# Patient Record
Sex: Male | Born: 1950 | Race: Black or African American | Hispanic: No | Marital: Single | Smoking: Former smoker
Health system: Southern US, Community
[De-identification: ages and names within clinical notes are randomized; demographics above are authoritative.]

## PROBLEM LIST (undated history)

## (undated) DIAGNOSIS — N183 Chronic kidney disease, stage 3 (moderate): Secondary | ICD-10-CM

## (undated) DIAGNOSIS — E119 Type 2 diabetes mellitus without complications: Secondary | ICD-10-CM

## (undated) DIAGNOSIS — J449 Chronic obstructive pulmonary disease, unspecified: Secondary | ICD-10-CM

## (undated) DIAGNOSIS — I25110 Atherosclerotic heart disease of native coronary artery with unstable angina pectoris: Secondary | ICD-10-CM

## (undated) DIAGNOSIS — J439 Emphysema, unspecified: Secondary | ICD-10-CM

## (undated) DIAGNOSIS — I2510 Atherosclerotic heart disease of native coronary artery without angina pectoris: Secondary | ICD-10-CM

## (undated) DIAGNOSIS — I10 Essential (primary) hypertension: Secondary | ICD-10-CM

## (undated) DIAGNOSIS — E785 Hyperlipidemia, unspecified: Secondary | ICD-10-CM

## (undated) DIAGNOSIS — R42 Dizziness and giddiness: Secondary | ICD-10-CM

## (undated) DIAGNOSIS — N186 End stage renal disease: Secondary | ICD-10-CM

## (undated) DIAGNOSIS — R0902 Hypoxemia: Secondary | ICD-10-CM

## (undated) HISTORY — DX: Atherosclerotic heart disease of native coronary artery with unstable angina pectoris: I25.110

## (undated) HISTORY — DX: Hypoxemia: R09.02

## (undated) HISTORY — DX: Chronic obstructive pulmonary disease, unspecified: J44.9

## (undated) HISTORY — DX: Atherosclerotic heart disease of native coronary artery without angina pectoris: I25.10

## (undated) HISTORY — DX: Hyperlipidemia, unspecified: E78.5

## (undated) HISTORY — DX: Dizziness and giddiness: R42

---

## 1998-11-21 ENCOUNTER — Encounter: Payer: Self-pay | Admitting: *Deleted

## 1998-11-21 ENCOUNTER — Emergency Department (HOSPITAL_COMMUNITY): Admission: EM | Admit: 1998-11-21 | Discharge: 1998-11-21 | Payer: Self-pay | Admitting: *Deleted

## 1999-06-15 ENCOUNTER — Encounter: Admission: RE | Admit: 1999-06-15 | Discharge: 1999-07-19 | Payer: Self-pay | Admitting: *Deleted

## 1999-08-05 ENCOUNTER — Ambulatory Visit (HOSPITAL_COMMUNITY): Admission: RE | Admit: 1999-08-05 | Discharge: 1999-08-05 | Payer: Self-pay | Admitting: *Deleted

## 2000-12-28 ENCOUNTER — Ambulatory Visit (HOSPITAL_COMMUNITY): Admission: RE | Admit: 2000-12-28 | Discharge: 2000-12-28 | Payer: Self-pay | Admitting: Family Medicine

## 2000-12-28 ENCOUNTER — Encounter: Payer: Self-pay | Admitting: Family Medicine

## 2001-08-13 ENCOUNTER — Ambulatory Visit (HOSPITAL_COMMUNITY): Admission: RE | Admit: 2001-08-13 | Discharge: 2001-08-13 | Payer: Self-pay | Admitting: Family Medicine

## 2001-08-13 ENCOUNTER — Encounter: Payer: Self-pay | Admitting: Family Medicine

## 2001-09-03 ENCOUNTER — Encounter: Payer: Self-pay | Admitting: Family Medicine

## 2001-09-03 ENCOUNTER — Ambulatory Visit (HOSPITAL_COMMUNITY): Admission: RE | Admit: 2001-09-03 | Discharge: 2001-09-03 | Payer: Self-pay | Admitting: Family Medicine

## 2002-02-06 ENCOUNTER — Ambulatory Visit (HOSPITAL_COMMUNITY): Admission: RE | Admit: 2002-02-06 | Discharge: 2002-02-06 | Payer: Self-pay | Admitting: Gastroenterology

## 2004-02-03 ENCOUNTER — Ambulatory Visit: Payer: Self-pay | Admitting: *Deleted

## 2004-03-15 ENCOUNTER — Ambulatory Visit: Payer: Self-pay | Admitting: Nurse Practitioner

## 2004-08-13 ENCOUNTER — Ambulatory Visit: Payer: Self-pay | Admitting: Nurse Practitioner

## 2004-08-19 ENCOUNTER — Ambulatory Visit: Payer: Self-pay | Admitting: Nurse Practitioner

## 2005-04-15 ENCOUNTER — Ambulatory Visit: Payer: Self-pay | Admitting: Nurse Practitioner

## 2005-04-28 ENCOUNTER — Ambulatory Visit: Payer: Self-pay | Admitting: Nurse Practitioner

## 2005-08-02 ENCOUNTER — Ambulatory Visit: Payer: Self-pay | Admitting: Nurse Practitioner

## 2006-02-27 ENCOUNTER — Ambulatory Visit: Payer: Self-pay | Admitting: Nurse Practitioner

## 2006-09-20 ENCOUNTER — Ambulatory Visit: Payer: Self-pay | Admitting: Nurse Practitioner

## 2006-10-06 ENCOUNTER — Ambulatory Visit: Payer: Self-pay | Admitting: Nurse Practitioner

## 2006-10-20 ENCOUNTER — Ambulatory Visit: Payer: Self-pay | Admitting: Nurse Practitioner

## 2006-11-28 ENCOUNTER — Ambulatory Visit: Payer: Self-pay | Admitting: Internal Medicine

## 2007-02-14 ENCOUNTER — Encounter (INDEPENDENT_AMBULATORY_CARE_PROVIDER_SITE_OTHER): Payer: Self-pay | Admitting: *Deleted

## 2008-01-22 ENCOUNTER — Encounter (INDEPENDENT_AMBULATORY_CARE_PROVIDER_SITE_OTHER): Payer: Self-pay | Admitting: Family Medicine

## 2008-01-22 ENCOUNTER — Ambulatory Visit: Payer: Self-pay | Admitting: Internal Medicine

## 2008-01-22 LAB — CONVERTED CEMR LAB: Microalb, Ur: 13.5 mg/dL — ABNORMAL HIGH (ref 0.00–1.89)

## 2008-03-13 ENCOUNTER — Ambulatory Visit: Payer: Self-pay | Admitting: Internal Medicine

## 2010-06-27 ENCOUNTER — Inpatient Hospital Stay (HOSPITAL_COMMUNITY)
Admission: EM | Admit: 2010-06-27 | Discharge: 2010-07-01 | DRG: 384 | Disposition: A | Discharge status: Home / Self Care | Payer: Medicare Other | Attending: Gastroenterology | Admitting: Gastroenterology

## 2010-06-27 DIAGNOSIS — I10 Essential (primary) hypertension: Secondary | ICD-10-CM | POA: Diagnosis present

## 2010-06-27 DIAGNOSIS — Z87891 Personal history of nicotine dependence: Secondary | ICD-10-CM

## 2010-06-27 DIAGNOSIS — K311 Adult hypertrophic pyloric stenosis: Secondary | ICD-10-CM | POA: Diagnosis present

## 2010-06-27 DIAGNOSIS — J3489 Other specified disorders of nose and nasal sinuses: Secondary | ICD-10-CM | POA: Diagnosis present

## 2010-06-27 DIAGNOSIS — J4489 Other specified chronic obstructive pulmonary disease: Secondary | ICD-10-CM | POA: Diagnosis present

## 2010-06-27 DIAGNOSIS — E119 Type 2 diabetes mellitus without complications: Secondary | ICD-10-CM | POA: Diagnosis present

## 2010-06-27 DIAGNOSIS — D649 Anemia, unspecified: Secondary | ICD-10-CM | POA: Diagnosis present

## 2010-06-27 DIAGNOSIS — K259 Gastric ulcer, unspecified as acute or chronic, without hemorrhage or perforation: Principal | ICD-10-CM | POA: Diagnosis present

## 2010-06-27 DIAGNOSIS — J449 Chronic obstructive pulmonary disease, unspecified: Secondary | ICD-10-CM | POA: Diagnosis present

## 2010-06-27 LAB — COMPREHENSIVE METABOLIC PANEL
ALT: 16 U/L (ref 0–53)
AST: 22 U/L (ref 0–37)
CO2: 23 mEq/L (ref 19–32)
Chloride: 104 mEq/L (ref 96–112)
Creatinine, Ser: 1.17 mg/dL (ref 0.4–1.5)
GFR calc Af Amer: >60 mL/min (ref >60)
GFR calc non Af Amer: >60 mL/min (ref >60)
Sodium: 138 mEq/L (ref 135–145)
Total Bilirubin: 0.4 mg/dL (ref 0.3–1.2)

## 2010-06-27 LAB — DIFFERENTIAL
Basophils Relative: 1 % (ref 0–1)
Monocytes Relative: 11 % (ref 3–12)
Neutro Abs: 5.4 10*3/uL (ref 1.7–7.7)
Neutrophils Relative %: 74 % (ref 43–77)

## 2010-06-27 LAB — URINALYSIS, ROUTINE W REFLEX MICROSCOPIC
Specific Gravity, Urine: 1.013 (ref 1.005–1.030)
Urine Glucose, Fasting: 250 mg/dL — AB
Urobilinogen, UA: 1 mg/dL (ref 0.0–1.0)
pH: 8 (ref 5.0–8.0)

## 2010-06-27 LAB — POCT I-STAT, CHEM 8
BUN: 22 mg/dL (ref 6–23)
Chloride: 105 mEq/L (ref 96–112)
Potassium: 4.1 mEq/L (ref 3.5–5.1)
Sodium: 139 mEq/L (ref 135–145)
TCO2: 25 mmol/L (ref 0–100)

## 2010-06-27 LAB — CBC
Hemoglobin: 11.6 g/dL — ABNORMAL LOW (ref 13.0–17.0)
MCH: 29.9 pg (ref 26.0–34.0)
RBC: 3.88 MIL/uL — ABNORMAL LOW (ref 4.22–5.81)

## 2010-06-27 LAB — URINE MICROSCOPIC-ADD ON

## 2010-06-28 LAB — CBC
HCT: 32 % — ABNORMAL LOW (ref 39.0–52.0)
Hemoglobin: 11.2 g/dL — ABNORMAL LOW (ref 13.0–17.0)
MCH: 29.8 pg (ref 26.0–34.0)
MCHC: 35 g/dL (ref 30.0–36.0)

## 2010-06-28 LAB — GLUCOSE, CAPILLARY: Glucose-Capillary: 254 mg/dL — ABNORMAL HIGH (ref 70–99)

## 2010-06-28 LAB — BASIC METABOLIC PANEL
CO2: 26 mEq/L (ref 19–32)
Calcium: 8.9 mg/dL (ref 8.4–10.5)
Creatinine, Ser: 1.06 mg/dL (ref 0.4–1.5)
Glucose, Bld: 171 mg/dL — ABNORMAL HIGH (ref 70–99)

## 2010-06-28 LAB — HEMOGLOBIN A1C: Mean Plasma Glucose: 166 mg/dL — ABNORMAL HIGH (ref <117)

## 2010-06-29 ENCOUNTER — Ambulatory Visit (HOSPITAL_COMMUNITY)
Admission: RE | Admit: 2010-06-29 | Discharge: 2010-06-29 | Payer: Self-pay | Source: Home / Self Care | Attending: Gastroenterology | Admitting: Gastroenterology

## 2010-06-29 LAB — GLUCOSE, CAPILLARY
Glucose-Capillary: 166 mg/dL — ABNORMAL HIGH (ref 70–99)
Glucose-Capillary: 149 mg/dL — ABNORMAL HIGH (ref 70–99)

## 2010-06-29 NOTE — H&P (Signed)
Shawn Collins, Shawn Collins               ACCOUNT NO.:  [id number]  MEDICAL RECORD NO.:  [id number]          PATIENT TYPE:  INP  LOCATION:  5024                         FACILITY:  [facility]  PHYSICIAN:  Odis Hollingshead, M.D.DATE OF BIRTH:  December 15, 1950  DATE OF ADMISSION:  06/27/2010 DATE OF DISCHARGE:                             HISTORY & PHYSICAL   REASON FOR ADMISSION:  Epigastric pain, nausea, and vomiting.  HISTORY:  This is a 60 year old male who was in his normal state of health until Thursday when he stated that he started developing epigastric burning with substernal burning consistent with reflux.  It slowly got better on Friday, but on Saturday the pain returned and was mostly in the epigastric region that was associated with significant nausea and vomiting.  He presented earlier to the emergency department here for evaluation.  His laboratory values demonstrated some anemia, but no elevation of his white blood cell count.  He subsequently underwent a CT scan of the abdomen.  This demonstrated some pelvic free fluid.  There is also a 3.4 x 3.1 cm fluid collection that was anterior to the antrum and the stomach with some extrinsic compression, but contrast did go through into the small bowel.  There was a small amount of right perinephric fluid as well.  It was for this reason that I was asked to see him.  He states he does not use aspirin or aspirin-like products.  He has intermittent heartburn and takes Tums for that.  The symptoms seem to be exacerbated when he tries to drink some liquids.  PAST MEDICAL HISTORY: 1. COPD. 2. Diabetes mellitus. 3. Hypertension.  PREVIOUS OPERATIONS:  Appendectomy.  ALLERGIES:  None.  MEDICATIONS:  Glimepiride, Janumet, and lisinopril.  SOCIAL HISTORY:  He is single.  Former smoker.  Denies alcohol use.  FAMILY HISTORY:  No gastric cancer in the family.  REVIEW OF SYSTEMS:  CARDIAC:  He denies any known heart disease or valve disease.   PULMONARY:  He denies asthma or pneumonia.  GI:  He denies any peptic ulcer disease, hepatitis, or diverticulitis.  GU:  He denies any kidney stones.  ENDOCRINE:  He has diabetes.  NEUROLOGIC:  No strokes or seizures.  HEMATOLOGIC:  No bleeding disorders or blood clots.  PHYSICAL EXAMINATION:  GENERAL:  Thin male in no acute distress, pleasant, and cooperative. VITAL SIGNS:  Temperature is 97.4, pulse 82, blood pressure 179/87, respiratory rate 18. EYES:  Extraocular motions intact.  No icterus. NECK:  Supple without mass. RESPIRATORY:  Breath sounds distant but equal and clear. CARDIOVASCULAR:  Regular rate.  Regular rhythm.  No murmurs. ABDOMEN:  Soft, nontender, right lower quadrant scars present.  He has active bowel sounds. MUSCULOSKELETAL:  He has adequate muscle tone.  No edema.  LABORATORY DATA:  Hemoglobin 11.6, white cell count 7300.  Complete metabolic is notable for an elevated glucose of 239 and albumin of 3.3, otherwise is within normal limits.  Lipase is normal.  CT scan was reviewed with the radiologist.  Two-view abdominal x-ray demonstrates no free intraperitoneal air and no bowel dilatation.  Urinalysis negative.  IMPRESSION:  Cystic lesion anterior  to stomach without any inflammatory changes.  Appears to be compressing the stomach somewhat, but contrast does get through.  A part of his symptoms could be related to this or gastroenteritis.  PLAN:  We will admit to the hospital.  Make him n.p.o. and give him IV fluids.  Put on proton pump inhibitor therapy.  We will obtain a GI consult for upper endoscopy.  If this is negative, maybe able to do a radiology-guided aspiration of the cyst fluid.     Odis Hollingshead, M.D.     [name]  D:  06/27/2010  T:  06/28/2010  Job:  [id number]  cc:   Nolene Ebbs, M.D.  Electronically Signed by Jackolyn Confer M.D. on 06/29/2010 09:29:59 AM

## 2010-06-30 LAB — GLUCOSE, CAPILLARY: Glucose-Capillary: 64 mg/dL — ABNORMAL LOW (ref 70–99)

## 2010-07-01 LAB — BASIC METABOLIC PANEL
BUN: 14 mg/dL (ref 6–23)
CO2: 25 mEq/L (ref 19–32)
Chloride: 108 mEq/L (ref 96–112)
Creatinine, Ser: 1.37 mg/dL (ref 0.4–1.5)
Glucose, Bld: 60 mg/dL — ABNORMAL LOW (ref 70–99)
Potassium: 4.4 mEq/L (ref 3.5–5.1)

## 2010-07-01 LAB — CBC
HCT: 30.3 % — ABNORMAL LOW (ref 39.0–52.0)
MCH: 29.5 pg (ref 26.0–34.0)
MCHC: 34.3 g/dL (ref 30.0–36.0)
MCV: 86.1 fL (ref 78.0–100.0)
RDW: 12.1 % (ref 11.5–15.5)

## 2010-07-01 LAB — GLUCOSE, CAPILLARY

## 2010-07-05 NOTE — Discharge Summary (Signed)
Shawn Collins, Shawn Collins               ACCOUNT NO.:  [id number]  MEDICAL RECORD NO.:  [id number]           PATIENT TYPE:  I  LOCATION:  [id number]                         FACILITY:  [facility]  PHYSICIAN:  Tory Emerald. Benson Norway, MD    DATE OF BIRTH:  1950/06/18  DATE OF ADMISSION:  06/27/2010 DATE OF DISCHARGE:  07/01/2010                              DISCHARGE SUMMARY   DISCHARGE DIAGNOSES: 1. Pyloric ulceration with resultant stenosis. 2. Extrinsic compression of the gastric antrum with no evidence of     stenosis from a 3 x 3 cm cyst. 3. Chronic obstructive pulmonary disease. 4. Diabetes. 5. Hypertension.  SURGEON:  Odis Hollingshead, MD  HISTORY OF PRESENT ILLNESS:  Please see the original H and P for full details.  HOSPITAL COURSE:  The patient was admitted with complaints of nausea, vomiting, and abdominal pain.  His symptoms apparently started the weekend before his admission and it mildly improved, but then continued to worsen.  As a result of his symptoms, he was admitted to the hospital for further evaluation and treatment.  CT scan was performed and did reveal that he had a 3 x 3 cm cystic fluid collection in the anterior portion of the antrum.  It was uncertain if this was the cause of his symptoms and subsequently GI consultation was requested for further evaluation and treatment.  The patient underwent an endoscopic ultrasound to evaluate this lesion, but it was clear that this lesion was not causing any types of obstruction.  An FNA was attempted, but it was very difficult to localize this lesion as the gastric lumen tended to roll over the cystic lesion.  Therefore, any further attempts at FNA was abandoned.  After the endoscopic ultrasound portion of the procedure, a standard upper endoscopy was performed with Adult EGD scope.  It was clear at this point that the patient had a pyloric ulcer with resultant stenosis.  Essentially the patient had a gastric outlet obstruction.   This area was able to be maneuvered with the standard endoscope without any significant difficulty.  It was felt that attempts to pass into the duodenum with an endoscope help to dilate this area. After the procedure, the patient was placed on intensive acid suppression with sucralfate 1 g p.o. q.i.d. and also b.i.d. Protonix. Over the next day 24-36 hours, the patient was quite well.  He was able to tolerate liquids without difficulty and subsequently he was advanced to regular diet.  On the day of discharge, he tolerated a regular diet for breakfast and lunch without any further complaints of abdominal pain or nausea and vomiting.  Because of his improved clinical status, it was felt that he could be discharged home without any difficulty.  He was instructed to follow up with Dr. Benson Norway in approximately 2 weeks and to call if he had any recurrence of symptoms.  Additionally he was instructed to avoid any types of vegetable matter at this time as this can result in difficulty passage through his pyloric region while the ulcer is healing up.  The patient was strongly encouraged to chew his food well.  He  will also continue his treatment with PPI b.i.d. and also sucralfate q.i.d.     Tory Emerald Benson Norway, MD     PDH/MEDQ  D:  07/01/2010  T:  07/02/2010  Job:  [id number]  cc:   Odis Hollingshead, M.D.  Electronically Signed by Carol Ada MD on 07/05/2010 04:15:17 PM

## 2010-07-05 NOTE — Consult Note (Signed)
NAMEBENJAMEN, Shawn Collins               ACCOUNT NO.:  [id number]  MEDICAL RECORD NO.:  [id number]          PATIENT TYPE:  INP  LOCATION:  5024                         FACILITY:  [facility]  PHYSICIAN:  Tory Emerald. Benson Norway, MD    DATE OF BIRTH:  06/14/1950  DATE OF CONSULTATION:  06/28/2010 DATE OF DISCHARGE:                                CONSULTATION   REASON FOR CONSULTATION:  Abdominal pain, nausea, vomiting, and fluid collection in the anterior portion of the antrum.  REFERRING PHYSICIAN:  Odis Hollingshead, MD  HISTORY OF PRESENT ILLNESS:  This is a 60 year old gentleman with a past medical history of COPD, diabetes, and hypertension, who was admitted to the hospital with complaints of nausea and vomiting and epigastric abdominal pain.  Symptoms are acute in onset, started last Thursday and then appeared to have wane over the beginning of the weekend and then subsequently worsened again which brought him to the hospital.  He underwent CT scanning.  He was noted to have a 3.4 x 3.1 cm fluid collection in the anterior portion of the antrum with some extrinsic compression.  It is uncertain that the patient has gastric outlet obstruction as a result of this type of finding.  The patient denies having any kind of abdominal pain such as this in the past.  He does take some over-the-counter antacids on an intermittent basis.  Because of the current findings, GI consultation was requested.  PAST MEDICAL HISTORY AND SURGICAL HISTORY:  As stated above with the addition of appendectomy.  ALLERGIES:  No known drug allergies.  MEDICATIONS: 1. Protonix 40 mg IV b.i.d. 2. Labetalol 20 mg IV q.4 hours p.r.n. 3. Morphine 2-4 mg IV q.2 hours p.r.n. 4. Zofran 4 mg IV q.4 hours p.r.n.  ALLERGIES:  No known drug allergies.  PHYSICAL EXAMINATION:  VITAL SIGNS:  Blood pressure is 173/91, heart rate is 76, respirations 18, temperature is 98.5. GENERAL:  The patient is in no acute distress, alert and  oriented. HEENT:  Normocephalic, atraumatic.  Extraocular muscles intact. NECK:  Supple.  No lymphadenopathy. LUNGS:  Clear to auscultation bilaterally. CARDIOVASCULAR:  Regular rate and rhythm. ABDOMEN:  Flat, soft, some discomfort in the epigastric region.  No rebound or rigidity.  Positive bowel sounds. EXTREMITIES:  No clubbing, cyanosis, or edema.  LABORATORY VALUES:  White blood cell count is 6.7, hemoglobin 11.2, MCV is 85.1, platelets 295.  Sodium 139, potassium 4.0, chloride 106, CO2 26, glucose 171, BUN 12, creatinine 1.0.  IMPRESSION: 1. Fluid collection in the anterior portion of the antrum. 2. Nausea and vomiting. 3. Mild anemia.  I am uncertain if the fluid collection is causing any gastric outlet obstruction at this time.  Further evaluation with EUS is required in order to help interrogate this cystic fluid collection.  Hopefully, if the collection can be drained, the patient can have some improvement in his nausea and vomiting issues.  Plan is for EUS tomorrow at 3:30 p.m. Further recommendations will be made pending the findings.     Tory Emerald Benson Norway, MD     PDH/MEDQ  D:  06/28/2010  T:  06/29/2010  Job:  [id number]  Electronically Signed by Carol Ada MD on 07/05/2010 04:15:14 PM

## 2013-03-06 ENCOUNTER — Emergency Department (HOSPITAL_COMMUNITY)
Admission: EM | Admit: 2013-03-06 | Discharge: 2013-03-07 | Disposition: A | Discharge status: Home / Self Care | Payer: Medicare Other | Attending: Emergency Medicine | Admitting: Emergency Medicine

## 2013-03-06 ENCOUNTER — Encounter (HOSPITAL_COMMUNITY): Payer: Self-pay | Admitting: Emergency Medicine

## 2013-03-06 DIAGNOSIS — W57XXXA Bitten or stung by nonvenomous insect and other nonvenomous arthropods, initial encounter: Secondary | ICD-10-CM | POA: Insufficient documentation

## 2013-03-06 DIAGNOSIS — Y939 Activity, unspecified: Secondary | ICD-10-CM | POA: Insufficient documentation

## 2013-03-06 DIAGNOSIS — E119 Type 2 diabetes mellitus without complications: Secondary | ICD-10-CM | POA: Insufficient documentation

## 2013-03-06 DIAGNOSIS — I10 Essential (primary) hypertension: Secondary | ICD-10-CM | POA: Insufficient documentation

## 2013-03-06 DIAGNOSIS — Y9229 Other specified public building as the place of occurrence of the external cause: Secondary | ICD-10-CM | POA: Insufficient documentation

## 2013-03-06 DIAGNOSIS — T148 Other injury of unspecified body region: Secondary | ICD-10-CM | POA: Insufficient documentation

## 2013-03-06 DIAGNOSIS — F17200 Nicotine dependence, unspecified, uncomplicated: Secondary | ICD-10-CM | POA: Insufficient documentation

## 2013-03-06 HISTORY — DX: Essential (primary) hypertension: I10

## 2013-03-06 HISTORY — DX: Type 2 diabetes mellitus without complications: E11.9

## 2013-03-06 NOTE — ED Notes (Signed)
Presents with rash from head to toes. Pt reports seeing bugs in the bed is staying at and squishing them and blood coming out.

## 2013-03-07 MED ORDER — DIPHENHYDRAMINE HCL 25 MG PO CAPS
25.0000 mg | ORAL_CAPSULE | Freq: Once | ORAL | Status: AC
  Administered 2013-03-07: 25 mg via ORAL
  Filled 2013-03-07: qty 1

## 2013-03-07 MED ORDER — HYDROCORTISONE 1 % EX CREA: TOPICAL_CREAM | CUTANEOUS | Status: DC

## 2013-03-07 MED ORDER — DIPHENHYDRAMINE HCL 25 MG PO TABS: 25.0000 mg | ORAL_TABLET | Freq: Four times a day (QID) | ORAL | Status: DC

## 2013-03-07 NOTE — ED Provider Notes (Signed)
CSN: [id number]     Arrival date & time 03/06/13  2249 History   First MD Initiated Contact with Patient 03/07/13 [phone]     Chief Complaint  Patient presents with  . Rash   HPI  History provided by the patient. The patient is a 62 year old male with history of hypertension and  diabetes who presents with complaints of insect and possible bed bug bites. Patient was recently staying at a motel did notice several small insects within his room. He awoke with significant bites and itching of his arms and body. He reports several insects on his skin which he did smash and kill. Areas are perfect. He has not used any creams or medications for symptoms. He denies any significant swelling of the extremities or body. Denies any difficulty breathing or swelling of the throat or mouth. No fever, chills or sweats. No other aggravating or alleviating factors. No other associated symptoms.    Past Medical History  Diagnosis Date  . Diabetes mellitus without complication   . Hypertension    History reviewed. No pertinent past surgical history. History reviewed. No pertinent family history. History  Substance Use Topics  . Smoking status: Current Every Day Smoker    Types: Cigarettes  . Smokeless tobacco: Not on file  . Alcohol Use: No    Review of Systems  Constitutional: Negative for fever and chills.  Respiratory: Negative for shortness of breath.   Skin: Positive for rash.  All other systems reviewed and are negative.    Allergies  Review of patient's allergies indicates no known allergies.  Home Medications  No current outpatient prescriptions on file. BP 122/68  Pulse 78  Temp(Src) 98.4 F (36.9 C) (Oral)  Resp 16  Wt 145 lb (65.772 kg)  SpO2 98% Physical Exam  Nursing note and vitals reviewed. Constitutional: He is oriented to person, place, and time. He appears well-developed and well-nourished. No distress.  HENT:  Head: Normocephalic.  Neck: Normal range of motion. Neck  supple.  Cardiovascular: Normal rate and regular rhythm.   Pulmonary/Chest: Effort normal and breath sounds normal. No stridor. No respiratory distress. He has no wheezes.  Abdominal: Soft.  Neurological: He is alert and oriented to person, place, and time.  Skin: Rash noted.  Multiple sporadic erythematous papules to the upper extremities and upper chest.  Psychiatric: He has a normal mood and affect. His behavior is normal.    ED Course  Procedures    COORDINATION OF CARE:  Nursing notes reviewed. Vital signs reviewed. Initial pt interview and examination performed. Lesions consistent with history of insect bites.  1:09 AM-Discussed treatment plan with pt at bedside, which includes Benadryl and topical hydrocortisone cream. Pt agrees with plan.      MDM   1. Bed bug bite          Martie Lee, PA-C 03/07/13 0413

## 2013-03-07 NOTE — ED Provider Notes (Signed)
Medical screening examination/treatment/procedure(s) were performed by non-physician practitioner and as supervising physician I was immediately available for consultation/collaboration.   Ezequiel Essex, MD 03/07/13 [phone]

## 2014-09-10 ENCOUNTER — Emergency Department (HOSPITAL_COMMUNITY): Payer: Medicare Other

## 2014-09-10 ENCOUNTER — Inpatient Hospital Stay (HOSPITAL_COMMUNITY)
Admission: EM | Admit: 2014-09-10 | Discharge: 2014-09-12 | DRG: 191 | Disposition: A | Discharge status: Home / Self Care | Payer: Medicare Other | Attending: Internal Medicine | Admitting: Internal Medicine

## 2014-09-10 ENCOUNTER — Encounter (HOSPITAL_COMMUNITY): Payer: Self-pay | Admitting: Emergency Medicine

## 2014-09-10 DIAGNOSIS — E119 Type 2 diabetes mellitus without complications: Secondary | ICD-10-CM

## 2014-09-10 DIAGNOSIS — R0902 Hypoxemia: Secondary | ICD-10-CM | POA: Diagnosis present

## 2014-09-10 DIAGNOSIS — E785 Hyperlipidemia, unspecified: Secondary | ICD-10-CM | POA: Diagnosis present

## 2014-09-10 DIAGNOSIS — I129 Hypertensive chronic kidney disease with stage 1 through stage 4 chronic kidney disease, or unspecified chronic kidney disease: Secondary | ICD-10-CM | POA: Diagnosis present

## 2014-09-10 DIAGNOSIS — R42 Dizziness and giddiness: Secondary | ICD-10-CM

## 2014-09-10 DIAGNOSIS — E1165 Type 2 diabetes mellitus with hyperglycemia: Secondary | ICD-10-CM | POA: Diagnosis present

## 2014-09-10 DIAGNOSIS — R0602 Shortness of breath: Secondary | ICD-10-CM

## 2014-09-10 DIAGNOSIS — J449 Chronic obstructive pulmonary disease, unspecified: Secondary | ICD-10-CM | POA: Diagnosis present

## 2014-09-10 DIAGNOSIS — N289 Disorder of kidney and ureter, unspecified: Secondary | ICD-10-CM | POA: Diagnosis present

## 2014-09-10 DIAGNOSIS — J189 Pneumonia, unspecified organism: Secondary | ICD-10-CM

## 2014-09-10 DIAGNOSIS — A084 Viral intestinal infection, unspecified: Secondary | ICD-10-CM | POA: Diagnosis not present

## 2014-09-10 DIAGNOSIS — J441 Chronic obstructive pulmonary disease with (acute) exacerbation: Secondary | ICD-10-CM | POA: Diagnosis not present

## 2014-09-10 DIAGNOSIS — Z825 Family history of asthma and other chronic lower respiratory diseases: Secondary | ICD-10-CM

## 2014-09-10 DIAGNOSIS — E860 Dehydration: Secondary | ICD-10-CM | POA: Diagnosis present

## 2014-09-10 DIAGNOSIS — E872 Acidosis: Secondary | ICD-10-CM | POA: Diagnosis present

## 2014-09-10 DIAGNOSIS — Z833 Family history of diabetes mellitus: Secondary | ICD-10-CM

## 2014-09-10 DIAGNOSIS — N182 Chronic kidney disease, stage 2 (mild): Secondary | ICD-10-CM | POA: Diagnosis present

## 2014-09-10 DIAGNOSIS — I10 Essential (primary) hypertension: Secondary | ICD-10-CM | POA: Diagnosis present

## 2014-09-10 DIAGNOSIS — N179 Acute kidney failure, unspecified: Secondary | ICD-10-CM | POA: Diagnosis present

## 2014-09-10 DIAGNOSIS — J439 Emphysema, unspecified: Secondary | ICD-10-CM

## 2014-09-10 DIAGNOSIS — E875 Hyperkalemia: Secondary | ICD-10-CM | POA: Diagnosis present

## 2014-09-10 DIAGNOSIS — Z87891 Personal history of nicotine dependence: Secondary | ICD-10-CM

## 2014-09-10 HISTORY — DX: Chronic obstructive pulmonary disease, unspecified: J44.9

## 2014-09-10 HISTORY — DX: Hypoxemia: R09.02

## 2014-09-10 HISTORY — DX: Emphysema, unspecified: J43.9

## 2014-09-10 HISTORY — DX: Dizziness and giddiness: R42

## 2014-09-10 LAB — CBC
HCT: 37 % — ABNORMAL LOW (ref 39.0–52.0)
HEMATOCRIT: 39.1 % (ref 39.0–52.0)
Hemoglobin: 13.2 g/dL (ref 13.0–17.0)
Hemoglobin: 12.3 g/dL — ABNORMAL LOW (ref 13.0–17.0)
MCH: 29.7 pg (ref 26.0–34.0)
MCH: 29.9 pg (ref 26.0–34.0)
MCHC: 33.8 g/dL (ref 30.0–36.0)
MCHC: 33.2 g/dL (ref 30.0–36.0)
MCV: 87.9 fL (ref 78.0–100.0)
MCV: 90 fL (ref 78.0–100.0)
Platelets: 243 10*3/uL (ref 150–400)
Platelets: 208 10*3/uL (ref 150–400)
RBC: 4.45 MIL/uL (ref 4.22–5.81)
RBC: 4.11 MIL/uL — ABNORMAL LOW (ref 4.22–5.81)
RDW: 12.3 % (ref 11.5–15.5)
RDW: 12.5 % (ref 11.5–15.5)
WBC: 6.2 10*3/uL (ref 4.0–10.5)
WBC: 6.7 10*3/uL (ref 4.0–10.5)

## 2014-09-10 LAB — BASIC METABOLIC PANEL
ANION GAP: 16 — AB (ref 5–15)
Anion gap: 11 (ref 5–15)
BUN: 36 mg/dL — ABNORMAL HIGH (ref 6–23)
BUN: 47 mg/dL — ABNORMAL HIGH (ref 6–23)
CALCIUM: 9 mg/dL (ref 8.4–10.5)
CO2: 18 mmol/L — ABNORMAL LOW (ref 19–32)
CO2: 24 mmol/L (ref 19–32)
Calcium: 9.2 mg/dL (ref 8.4–10.5)
Chloride: 99 mmol/L (ref 96–112)
Chloride: 100 mmol/L (ref 96–112)
Creatinine, Ser: 1.73 mg/dL — ABNORMAL HIGH (ref 0.50–1.35)
Creatinine, Ser: 2.03 mg/dL — ABNORMAL HIGH (ref 0.50–1.35)
GFR calc Af Amer: 38 mL/min — ABNORMAL LOW (ref >90)
GFR calc non Af Amer: 33 mL/min — ABNORMAL LOW (ref >90)
GFR, EST AFRICAN AMERICAN: 46 mL/min — AB (ref >90)
GFR, EST NON AFRICAN AMERICAN: 40 mL/min — AB (ref >90)
Glucose, Bld: 176 mg/dL — ABNORMAL HIGH (ref 70–99)
Glucose, Bld: 445 mg/dL — ABNORMAL HIGH (ref 70–99)
Potassium: 5.3 mmol/L — ABNORMAL HIGH (ref 3.5–5.1)
Potassium: 5.3 mmol/L — ABNORMAL HIGH (ref 3.5–5.1)
SODIUM: 133 mmol/L — AB (ref 135–145)
Sodium: 135 mmol/L (ref 135–145)

## 2014-09-10 LAB — CREATININE, SERUM
Creatinine, Ser: 2.19 mg/dL — ABNORMAL HIGH (ref 0.50–1.35)
GFR calc non Af Amer: 30 mL/min — ABNORMAL LOW (ref >90)
GFR, EST AFRICAN AMERICAN: 35 mL/min — AB (ref >90)

## 2014-09-10 LAB — GLUCOSE, CAPILLARY
GLUCOSE-CAPILLARY: 460 mg/dL — AB (ref 70–99)
GLUCOSE-CAPILLARY: 489 mg/dL — AB (ref 70–99)
GLUCOSE-CAPILLARY: 517 mg/dL — AB (ref 70–99)
GLUCOSE-CAPILLARY: 568 mg/dL — AB (ref 70–99)
Glucose-Capillary: 453 mg/dL — ABNORMAL HIGH (ref 70–99)
Glucose-Capillary: 526 mg/dL — ABNORMAL HIGH (ref 70–99)
Glucose-Capillary: 308 mg/dL — ABNORMAL HIGH (ref 70–99)

## 2014-09-10 LAB — CBG MONITORING, ED: GLUCOSE-CAPILLARY: 186 mg/dL — AB (ref 70–99)

## 2014-09-10 LAB — I-STAT TROPONIN, ED: Troponin i, poc: 0.01 ng/mL (ref 0.00–0.08)

## 2014-09-10 LAB — BRAIN NATRIURETIC PEPTIDE: B Natriuretic Peptide: 25.2 pg/mL (ref 0.0–100.0)

## 2014-09-10 MED ORDER — HEPARIN SODIUM (PORCINE) 5000 UNIT/ML IJ SOLN
5000.0000 [IU] | Freq: Three times a day (TID) | INTRAMUSCULAR | Status: DC
  Administered 2014-09-10 – 2014-09-12 (×5): 5000 [IU] via SUBCUTANEOUS
  Filled 2014-09-10 (×5): qty 1

## 2014-09-10 MED ORDER — INSULIN ASPART 100 UNIT/ML ~~LOC~~ SOLN
15.0000 [IU] | Freq: Once | SUBCUTANEOUS | Status: AC
  Administered 2014-09-10: 15 [IU] via SUBCUTANEOUS

## 2014-09-10 MED ORDER — ALBUTEROL SULFATE (2.5 MG/3ML) 0.083% IN NEBU: 2.5000 mg | INHALATION_SOLUTION | Freq: Four times a day (QID) | RESPIRATORY_TRACT | Status: DC

## 2014-09-10 MED ORDER — SODIUM CHLORIDE 0.9 % IV BOLUS (SEPSIS)
1000.0000 mL | Freq: Once | INTRAVENOUS | Status: AC
Start: 2014-09-10 — End: 2014-09-10
  Administered 2014-09-10: 1000 mL via INTRAVENOUS

## 2014-09-10 MED ORDER — PREDNISONE 20 MG PO TABS
60.0000 mg | ORAL_TABLET | Freq: Once | ORAL | Status: AC
  Administered 2014-09-10: 60 mg via ORAL
  Filled 2014-09-10: qty 3

## 2014-09-10 MED ORDER — SIMVASTATIN 10 MG PO TABS
10.0000 mg | ORAL_TABLET | Freq: Every day | ORAL | Status: DC
  Administered 2014-09-10 – 2014-09-11 (×2): 10 mg via ORAL
  Filled 2014-09-10 (×2): qty 1

## 2014-09-10 MED ORDER — HYDRALAZINE HCL 10 MG PO TABS
10.0000 mg | ORAL_TABLET | Freq: Three times a day (TID) | ORAL | Status: DC
  Administered 2014-09-10 – 2014-09-12 (×5): 10 mg via ORAL
  Filled 2014-09-10 (×5): qty 1

## 2014-09-10 MED ORDER — LISINOPRIL 20 MG PO TABS: 20.0000 mg | ORAL_TABLET | Freq: Every day | ORAL | Status: DC

## 2014-09-10 MED ORDER — IPRATROPIUM BROMIDE 0.02 % IN SOLN
0.5000 mg | Freq: Four times a day (QID) | RESPIRATORY_TRACT | Status: DC
Start: 2014-09-10 — End: 2014-09-10

## 2014-09-10 MED ORDER — BISACODYL 10 MG RE SUPP: 10.0000 mg | Freq: Every day | RECTAL | Status: DC | PRN

## 2014-09-10 MED ORDER — IPRATROPIUM BROMIDE 0.02 % IN SOLN
0.5000 mg | Freq: Once | RESPIRATORY_TRACT | Status: AC
  Administered 2014-09-10: 0.5 mg via RESPIRATORY_TRACT
  Filled 2014-09-10: qty 2.5

## 2014-09-10 MED ORDER — DIPHENHYDRAMINE HCL 25 MG PO TABS
25.0000 mg | ORAL_TABLET | Freq: Four times a day (QID) | ORAL | Status: DC
Start: 2014-09-10 — End: 2014-09-11
  Administered 2014-09-10 – 2014-09-11 (×4): 25 mg via ORAL
  Filled 2014-09-10 (×11): qty 1

## 2014-09-10 MED ORDER — INSULIN ASPART 100 UNIT/ML ~~LOC~~ SOLN
10.0000 [IU] | Freq: Once | SUBCUTANEOUS | Status: AC
  Administered 2014-09-10: 10 [IU] via SUBCUTANEOUS

## 2014-09-10 MED ORDER — INSULIN ASPART 100 UNIT/ML ~~LOC~~ SOLN
0.0000 [IU] | Freq: Three times a day (TID) | SUBCUTANEOUS | Status: DC
  Administered 2014-09-11: 1 [IU] via SUBCUTANEOUS
  Administered 2014-09-11: 5 [IU] via SUBCUTANEOUS

## 2014-09-10 MED ORDER — PNEUMOCOCCAL VAC POLYVALENT 25 MCG/0.5ML IJ INJ
0.5000 mL | INJECTION | INTRAMUSCULAR | Status: AC
  Administered 2014-09-12: 0.5 mL via INTRAMUSCULAR
  Filled 2014-09-10: qty 0.5

## 2014-09-10 MED ORDER — PROMETHAZINE HCL 25 MG PO TABS: 12.5000 mg | ORAL_TABLET | Freq: Four times a day (QID) | ORAL | Status: DC | PRN

## 2014-09-10 MED ORDER — ALBUTEROL (5 MG/ML) CONTINUOUS INHALATION SOLN
10.0000 mg/h | INHALATION_SOLUTION | Freq: Once | RESPIRATORY_TRACT | Status: AC
  Administered 2014-09-10: 10 mg/h via RESPIRATORY_TRACT
  Filled 2014-09-10: qty 20

## 2014-09-10 MED ORDER — PREDNISONE 50 MG PO TABS
50.0000 mg | ORAL_TABLET | Freq: Every day | ORAL | Status: DC
  Administered 2014-09-11: 50 mg via ORAL
  Filled 2014-09-10: qty 1

## 2014-09-10 MED ORDER — INSULIN GLARGINE 100 UNIT/ML ~~LOC~~ SOLN
7.0000 [IU] | Freq: Every day | SUBCUTANEOUS | Status: DC
  Administered 2014-09-10: 7 [IU] via SUBCUTANEOUS
  Filled 2014-09-10 (×2): qty 0.07

## 2014-09-10 MED ORDER — IPRATROPIUM-ALBUTEROL 0.5-2.5 (3) MG/3ML IN SOLN
3.0000 mL | Freq: Four times a day (QID) | RESPIRATORY_TRACT | Status: DC
  Administered 2014-09-10 (×2): 3 mL via RESPIRATORY_TRACT
  Filled 2014-09-10 (×2): qty 3

## 2014-09-10 MED ORDER — SODIUM CHLORIDE 0.9 % IJ SOLN
3.0000 mL | Freq: Two times a day (BID) | INTRAMUSCULAR | Status: DC
  Administered 2014-09-10: 3 mL via INTRAVENOUS

## 2014-09-10 MED ORDER — INSULIN ASPART 100 UNIT/ML ~~LOC~~ SOLN
5.0000 [IU] | Freq: Once | SUBCUTANEOUS | Status: AC
  Administered 2014-09-10: 5 [IU] via SUBCUTANEOUS

## 2014-09-10 MED ORDER — INSULIN ASPART 100 UNIT/ML ~~LOC~~ SOLN
12.0000 [IU] | Freq: Once | SUBCUTANEOUS | Status: AC
  Administered 2014-09-10: 12 [IU] via SUBCUTANEOUS

## 2014-09-10 MED ORDER — INSULIN ASPART 100 UNIT/ML ~~LOC~~ SOLN
3.0000 [IU] | Freq: Three times a day (TID) | SUBCUTANEOUS | Status: DC
  Administered 2014-09-10 – 2014-09-11 (×4): 3 [IU] via SUBCUTANEOUS

## 2014-09-10 MED ORDER — SODIUM CHLORIDE 0.9 % IV SOLN
INTRAVENOUS | Status: DC
  Administered 2014-09-10: 11:00:00 via INTRAVENOUS

## 2014-09-10 MED ORDER — SODIUM CHLORIDE 0.9 % IV SOLN
INTRAVENOUS | Status: DC
  Administered 2014-09-10 – 2014-09-12 (×3): via INTRAVENOUS

## 2014-09-10 MED ORDER — ALBUTEROL SULFATE (2.5 MG/3ML) 0.083% IN NEBU: 2.5000 mg | INHALATION_SOLUTION | RESPIRATORY_TRACT | Status: DC | PRN

## 2014-09-10 NOTE — Progress Notes (Signed)
Paged K Schorr for Pt's CBG at [place] for the result of [phone]

## 2014-09-10 NOTE — Progress Notes (Signed)
Patient ambulating in [place] with no oxygen and no complaints of dizziness, saturation is 100%

## 2014-09-10 NOTE — Progress Notes (Signed)
Rechecked cbg=308; paged result K. Schorr. Will continue to monitor pt.

## 2014-09-10 NOTE — ED Notes (Signed)
Attempted to given report x2. Nurse unable, to call back.

## 2014-09-10 NOTE — Progress Notes (Signed)
Page K.Schorr for [employer] STAT result.

## 2014-09-10 NOTE — Progress Notes (Signed)
cbg on recheck was 517. MD notified

## 2014-09-10 NOTE — ED Notes (Signed)
The patient called EMS due to generalized weakness, dizziness for three days.  He has not been able to eat alot due to loss of appetite.  The patient called EMS because his weakness got worse.  According to EMS, he has not been able to take his medications for the last three days because he has not been able to eat.  Patient is a [employer]-15.  Alert and orientedx4.

## 2014-09-10 NOTE — ED Notes (Signed)
PT ambulated 10 feet in hallway on pulse ox. Pt reported being very dizzy and stumbled multiple times. When pt stated he was about to passout he was returned to bed. During ambulation o2 saturation was 100% and hr was 110bpm. When pt turned to sit in bed pulse ox dropped to 88% and then quickly rose to 94% once pt sat down.

## 2014-09-10 NOTE — Progress Notes (Signed)
MD  notified of cbg 526.

## 2014-09-10 NOTE — Progress Notes (Signed)
MD notified of finger stick glucose, 460. Orders received. Will recheck in 30 minutes.

## 2014-09-10 NOTE — Progress Notes (Signed)
Recheck CBG=568; Paged on call K. Schorr.

## 2014-09-10 NOTE — H&P (Signed)
[facility] History and Physical  Kaymen Adrian [id number] DOB: July 27, 1950 DOA: 09/10/2014  Referring physician: ED PCP: Philis Fendt, MD  Specialists: None  Chief Complaint: Dizzy, weak  HPI:  64 y/o ? Prior history EGD/colonoscopy with pyloric ulceration 06/2010, COPD, history DM T July 2, history HTN, Previously in good health started feeling poorly for/10/16. States he felt "bad", states he's had episodic nausea and vomiting times the past 2 days and has not been able to keep anything down. Associated with this, + lightheadedness, + dizziness, - able to ambulate to the wall, - chest pain, - rash, - L contacts, - outside food, - travel, - immunization for flu recently. He states he has not really been able to make it out and has had also some discomfort with coughing and with sputum. He does not state what color the sputum once. He lives alone and does not smoke and quit at the age of 2 however has been exposed to his brother and another relative who smoked heavily and has been diagnosed in past with emphysema by his primary care physician. He tells me that he is disabled secondary to diabetes mellitus and hypertension.  Emergency room workup revealed potassium 5.3, bicarbonate 18 BUN/creatinine 36/1.73, [this appears to be his baseline] BNP 25, point Troponin 0.01 Glucose 176, two-view chest x-ray should confirm COPD however no other issue   Review of Systems:  A 14 point ROS was performed and is negative except as noted in the HPI   Past Medical History  Diagnosis Date  . Diabetes mellitus without complication   . Hypertension   . Emphysema lung   . Hyperlipidemia    History reviewed. No pertinent past surgical history. Social History:  History   Social History Narrative    No Known Allergies  History reviewed. No pertinent family history.  MOther and father died early Patients mother had diabetes  Prior to Admission medications   Medication Sig Start  Date End Date Taking? Authorizing Provider  glimepiride (AMARYL) 4 MG tablet Take 4 mg by mouth daily with breakfast.  09/07/14  Yes Historical Provider, MD  lisinopril (PRINIVIL,ZESTRIL) 20 MG tablet Take 20 mg by mouth daily.  09/07/14  Yes Historical Provider, MD  simvastatin (ZOCOR) 10 MG tablet Take 10 mg by mouth daily.  09/07/14  Yes Historical Provider, MD  diphenhydrAMINE (BENADRYL) 25 MG tablet Take 1 tablet (25 mg total) by mouth every 6 (six) hours. Patient not taking: Reported on 09/10/2014 03/07/13   Hazel Sams, PA-C  hydrocortisone cream 1 % Apply to affected area 2 times daily Patient not taking: Reported on 09/10/2014 03/07/13   Hazel Sams, PA-C   Physical Exam: Filed Vitals:   09/10/14 0516 09/10/14 0530 09/10/14 0550 09/10/14 0722  BP: 139/80  180/85 124/62  Pulse: 86  81 108  Temp: 99.2 F (37.3 C) 99.7 F (37.6 C)    TempSrc: Oral     Resp: '19  19 20  ' Height: '5\' 11"'  (1.803 m)     Weight: 61.236 kg (135 lb)     SpO2: 100%  100% 99%    EOMI NCAT, edentulous Chest clinically clear with no wheeze no rales no rhonchi, no TVF, no TVR J6-B3 holosystolic 4/6 murmur across precordium [never been told he has his] Abdomen soft nontender nondistended no rebound No lower extremity edema No rash Mallampati 1 Neck veins appear flat   Labs on Admission:  Basic Metabolic Panel:  Recent Labs Lab 09/10/14 0614  NA 133*  K  5.3*  CL 99  CO2 18*  GLUCOSE 176*  BUN 36*  CREATININE 1.73*  CALCIUM 9.0   Liver Function Tests: No results for input(s): AST, ALT, ALKPHOS, BILITOT, PROT, ALBUMIN in the last 168 hours. No results for input(s): LIPASE, AMYLASE in the last 168 hours. No results for input(s): AMMONIA in the last 168 hours. CBC:  Recent Labs Lab 09/10/14 0614  WBC 6.2  HGB 13.2  HCT 39.1  MCV 87.9  PLT 243   Cardiac Enzymes: No results for input(s): CKTOTAL, CKMB, CKMBINDEX, TROPONINI in the last 168 hours.  BNP (last 3 results)  Recent Labs   09/10/14 0614  BNP 25.2    ProBNP (last 3 results) No results for input(s): PROBNP in the last 8760 hours.  CBG:  Recent Labs Lab 09/10/14 0523  GLUCAP 186*    Radiological Exams on Admission: Dg Chest 2 View  09/10/2014   CLINICAL DATA:  Shortness of breath, weakness for 4 days. History of COPD.  EXAM: CHEST  2 VIEW  COMPARISON:  None.  FINDINGS: Cardiomediastinal silhouette is unremarkable. Mildly increased lung volumes which can be seen with patient's known COPD. The lungs are clear without pleural effusions or focal consolidations. Trachea projects midline and there is no pneumothorax. Soft tissue planes and included osseous structures are non-suspicious. Mild upper thoracic dextroscoliosis. Nipple shadow LEFT lung base.  IMPRESSION: No acute cardiopulmonary process.   Electronically Signed   By: Elon Alas   On: 09/10/2014 06:27    EKG: Independently reviewed. Sinus rhythm PR interval 0.12, QRS axis 45, slight J-point elevation V2 V3 without any specific ST-T wave changes other than the J-point elevation.  Assessment/Plan Principal Problem:   Viral gastroenteritis-unclear etiology, DDX flu, DDX other viral illness. As no specific current diarrhea but does have nausea vomiting will empirically manage, place on IV saline and monitor for resolution, he did have a low-grade temperature so we will get blood cultures X2 if patient spikes a temperature. At this stage I do not feel he needs any antibiotics rather we need to monitor him. We will repeat an x-ray in the morning of the chest to ensure that this is not an occult pneumonia that did not show up At the time of my evaluation he did not have anything acute going on in his abdomen and I do not feel he needs any imaging or any further issues ruled out Subsequently we can decide on whether he can go home in the next day day and a half.    Volume depletion with baseline CK D stage 2-associated with mild metabolic  acidosis-patient has not been able to eat or drink much. We will volume replete him with IV saline we will repeat a be met in the morning. I would not do anything differently for his potassium at this stage is in no specific EKG changes.  Patient has also history of use of lisinopril which we will hold given his renal insufficiency which could account for his hyperkalemia as well.     COPD (chronic obstructive pulmonary disease)/ emphysema-is not a smoker and has emphysema secondary to secondhand smoke?Marland Kitchen We will keep him on inhalers while here. Note that he is not on prior to admission albuterol--we will give him a steroid burst for 5 days with prednisone 50 mg. I'm not convinced that his COPD nor hypoxia plays a role in his current debility and weakness   Hypoxia-unclear etiology. This is potentially secondary to some deconditioning however we will need to  monitor him.  Patient should be ambulated around the room and desats screens obtained.   Grade 4 systolic murmur-I will defer to outpatient physician to determine if there is a need for echocardiogram. At this stage she is not in any overt volume overload   Dizzy-->?Orthostatic?   Diabetes mellitus without complication-patient should be kept on a regular diet and we will hold his Amaryl. We will give him sliding scale insulin   Hypertension-patient will need alternative therapy for hypertension and I have held his ACE inhibitor-I have placed the patient on hydralazine instead  Time spent: 55 Full code [place], [place] [facility] Pager [phone]  If 7PM-7AM, please contact night-coverage [email or web address] Password [email or web address] 09/10/2014, 9:09 AM

## 2014-09-10 NOTE — ED Notes (Signed)
Attempted to call report, nurse to call back. 

## 2014-09-10 NOTE — ED Notes (Signed)
Spoke with bed placement, room assignment changed.

## 2014-09-10 NOTE — ED Provider Notes (Signed)
CSN: [id number]     Arrival date & time 09/10/14  0508 History   First MD Initiated Contact with Patient 09/10/14 [phone]     Chief Complaint  Patient presents with  . Weakness    The patient called EMS due to generalized weakness, dizziness for three days.  He has not been able to eat alot due to loss of appetite.    . Dizziness     (Consider location/radiation/quality/duration/timing/severity/associated sxs/prior Treatment) Patient is a 64 y.o. male presenting with weakness and dizziness. The history is provided by the patient.  Weakness This is a new problem. The current episode started more than 2 days ago. The problem occurs constantly. The problem has not changed since onset.Associated symptoms include shortness of breath. Pertinent negatives include no chest pain and no abdominal pain. Exacerbated by: ambulation. Nothing relieves the symptoms. He has tried nothing for the symptoms.  Dizziness Associated symptoms: shortness of breath and weakness   Associated symptoms: no chest pain and no vomiting     Past Medical History  Diagnosis Date  . Diabetes mellitus without complication   . Hypertension   . Emphysema lung   . Hyperlipidemia    History reviewed. No pertinent past surgical history. History reviewed. No pertinent family history. History  Substance Use Topics  . Smoking status: Former Research scientist (life sciences)  . Smokeless tobacco: Not on file  . Alcohol Use: No    Review of Systems  Constitutional: Negative for fever and chills.  Respiratory: Positive for cough and shortness of breath.   Cardiovascular: Negative for chest pain.  Gastrointestinal: Negative for vomiting and abdominal pain.  Neurological: Positive for dizziness and weakness.  All other systems reviewed and are negative.     Allergies  Review of patient's allergies indicates no known allergies.  Home Medications   Prior to Admission medications   Medication Sig Start Date End Date Taking? Authorizing Provider   glimepiride (AMARYL) 4 MG tablet Take 4 mg by mouth daily with breakfast.  09/07/14  Yes Historical Provider, MD  lisinopril (PRINIVIL,ZESTRIL) 20 MG tablet Take 20 mg by mouth daily.  09/07/14  Yes Historical Provider, MD  simvastatin (ZOCOR) 10 MG tablet Take 10 mg by mouth daily.  09/07/14  Yes Historical Provider, MD  diphenhydrAMINE (BENADRYL) 25 MG tablet Take 1 tablet (25 mg total) by mouth every 6 (six) hours. Patient not taking: Reported on 09/10/2014 03/07/13   Hazel Sams, PA-C  hydrocortisone cream 1 % Apply to affected area 2 times daily Patient not taking: Reported on 09/10/2014 03/07/13   Hazel Sams, PA-C   BP 124/62 mmHg  Pulse 108  Temp(Src) 99.7 F (37.6 C) (Oral)  Resp 20  Ht 5\' 11"  (1.803 m)  Wt 135 lb (61.236 kg)  BMI 18.84 kg/m2  SpO2 99% Physical Exam  Constitutional: He is oriented to person, place, and time. He appears well-developed and well-nourished. No distress.  HENT:  Head: Normocephalic and atraumatic.  Mouth/Throat: No oropharyngeal exudate.  Eyes: EOM are normal. Pupils are equal, round, and reactive to light.  Neck: Normal range of motion. Neck supple.  Cardiovascular: Normal rate and regular rhythm.  Exam reveals no friction rub.   No murmur heard. Pulmonary/Chest: He is in respiratory distress (moderately decreased). He has wheezes (mild, diffuse). He has no rales.  Abdominal: Soft. He exhibits no distension. There is no tenderness. There is no rebound.  Musculoskeletal: Normal range of motion. He exhibits no edema.  Neurological: He is alert and oriented to person, place, and  time. No cranial nerve deficit. He exhibits normal muscle tone. Coordination normal.  Skin: No rash noted. He is not diaphoretic.  Nursing note and vitals reviewed.   ED Course  Procedures (including critical care time) Labs Review Labs Reviewed  BASIC METABOLIC PANEL - Abnormal; Notable for the following:    Sodium 133 (*)    Potassium 5.3 (*)    CO2 18 (*)     Glucose, Bld 176 (*)    BUN 36 (*)    Creatinine, Ser 1.73 (*)    GFR calc non Af Amer 40 (*)    GFR calc Af Amer 46 (*)    Anion gap 16 (*)    All other components within normal limits  CBG MONITORING, ED - Abnormal; Notable for the following:    Glucose-Capillary 186 (*)    All other components within normal limits  CBC  BRAIN NATRIURETIC PEPTIDE  I-STAT TROPOININ, ED    Imaging Review Dg Chest 2 View  09/10/2014   CLINICAL DATA:  Shortness of breath, weakness for 4 days. History of COPD.  EXAM: CHEST  2 VIEW  COMPARISON:  None.  FINDINGS: Cardiomediastinal silhouette is unremarkable. Mildly increased lung volumes which can be seen with patient's known COPD. The lungs are clear without pleural effusions or focal consolidations. Trachea projects midline and there is no pneumothorax. Soft tissue planes and included osseous structures are non-suspicious. Mild upper thoracic dextroscoliosis. Nipple shadow LEFT lung base.  IMPRESSION: No acute cardiopulmonary process.   Electronically Signed   By: Elon Alas   On: 09/10/2014 06:27     EKG Interpretation   Date/Time:  Wednesday September 10 2014 05:14:40 EDT Ventricular Rate:  84 PR Interval:  154 QRS Duration: 74 QT Interval:  357 QTC Calculation: 422 R Axis:   53 Text Interpretation:  Sinus rhythm Right atrial enlargement Borderline low  voltage, extremity leads Anteroseptal infarct, old No significant change  since last tracing              Confirmed by Mingo Amber  MD, [place] ([id number]) on  09/10/2014 5:31:25 AM      MDM   Final diagnoses:  Shortness of breath    64 year old male here with dizziness, shortness of breath. He is also on in general is weakness previously been going on for the past 2-3 days. History of COPD. Denies any chest pain, fever, cough. Here when speaking he has to cough after beginning only a few words. After an hour of breathing treatment he's improved, however unable to ambulate safely, still dizzy. AFVSS  here. BNP normal. Admitted.   Evelina Bucy, MD 09/10/14 [phone]

## 2014-09-11 ENCOUNTER — Observation Stay (HOSPITAL_COMMUNITY): Payer: Medicare Other

## 2014-09-11 DIAGNOSIS — E872 Acidosis: Secondary | ICD-10-CM | POA: Diagnosis present

## 2014-09-11 DIAGNOSIS — I129 Hypertensive chronic kidney disease with stage 1 through stage 4 chronic kidney disease, or unspecified chronic kidney disease: Secondary | ICD-10-CM | POA: Diagnosis present

## 2014-09-11 DIAGNOSIS — A084 Viral intestinal infection, unspecified: Secondary | ICD-10-CM | POA: Diagnosis not present

## 2014-09-11 DIAGNOSIS — R0602 Shortness of breath: Secondary | ICD-10-CM | POA: Diagnosis present

## 2014-09-11 DIAGNOSIS — E785 Hyperlipidemia, unspecified: Secondary | ICD-10-CM | POA: Diagnosis present

## 2014-09-11 DIAGNOSIS — E1165 Type 2 diabetes mellitus with hyperglycemia: Secondary | ICD-10-CM | POA: Diagnosis present

## 2014-09-11 DIAGNOSIS — Z87891 Personal history of nicotine dependence: Secondary | ICD-10-CM | POA: Diagnosis not present

## 2014-09-11 DIAGNOSIS — E875 Hyperkalemia: Secondary | ICD-10-CM | POA: Diagnosis present

## 2014-09-11 DIAGNOSIS — N289 Disorder of kidney and ureter, unspecified: Secondary | ICD-10-CM | POA: Diagnosis present

## 2014-09-11 DIAGNOSIS — N182 Chronic kidney disease, stage 2 (mild): Secondary | ICD-10-CM | POA: Diagnosis present

## 2014-09-11 DIAGNOSIS — E860 Dehydration: Secondary | ICD-10-CM | POA: Diagnosis present

## 2014-09-11 DIAGNOSIS — N179 Acute kidney failure, unspecified: Secondary | ICD-10-CM | POA: Diagnosis present

## 2014-09-11 DIAGNOSIS — R0902 Hypoxemia: Secondary | ICD-10-CM | POA: Diagnosis present

## 2014-09-11 DIAGNOSIS — Z825 Family history of asthma and other chronic lower respiratory diseases: Secondary | ICD-10-CM | POA: Diagnosis not present

## 2014-09-11 DIAGNOSIS — J441 Chronic obstructive pulmonary disease with (acute) exacerbation: Secondary | ICD-10-CM | POA: Diagnosis present

## 2014-09-11 DIAGNOSIS — Z833 Family history of diabetes mellitus: Secondary | ICD-10-CM | POA: Diagnosis not present

## 2014-09-11 LAB — COMPREHENSIVE METABOLIC PANEL
ALK PHOS: 38 U/L — AB (ref 39–117)
ALT: 22 U/L (ref 0–53)
AST: 27 U/L (ref 0–37)
Albumin: 2.8 g/dL — ABNORMAL LOW (ref 3.5–5.2)
Anion gap: 7 (ref 5–15)
BUN: 40 mg/dL — ABNORMAL HIGH (ref 6–23)
CO2: 24 mmol/L (ref 19–32)
Calcium: 8.8 mg/dL (ref 8.4–10.5)
Chloride: 108 mmol/L (ref 96–112)
Creatinine, Ser: 1.65 mg/dL — ABNORMAL HIGH (ref 0.50–1.35)
GFR calc non Af Amer: 42 mL/min — ABNORMAL LOW (ref >90)
GFR, EST AFRICAN AMERICAN: 49 mL/min — AB (ref >90)
GLUCOSE: 103 mg/dL — AB (ref 70–99)
POTASSIUM: 4.4 mmol/L (ref 3.5–5.1)
Sodium: 139 mmol/L (ref 135–145)
Total Bilirubin: 0.6 mg/dL (ref 0.3–1.2)
Total Protein: 5.6 g/dL — ABNORMAL LOW (ref 6.0–8.3)

## 2014-09-11 LAB — GLUCOSE, CAPILLARY
GLUCOSE-CAPILLARY: 123 mg/dL — AB (ref 70–99)
GLUCOSE-CAPILLARY: 132 mg/dL — AB (ref 70–99)
GLUCOSE-CAPILLARY: 205 mg/dL — AB (ref 70–99)
Glucose-Capillary: 288 mg/dL — ABNORMAL HIGH (ref 70–99)
Glucose-Capillary: 240 mg/dL — ABNORMAL HIGH (ref 70–99)

## 2014-09-11 LAB — CBC
HCT: 33.5 % — ABNORMAL LOW (ref 39.0–52.0)
Hemoglobin: 11.3 g/dL — ABNORMAL LOW (ref 13.0–17.0)
MCH: 29.4 pg (ref 26.0–34.0)
MCHC: 33.7 g/dL (ref 30.0–36.0)
MCV: 87.2 fL (ref 78.0–100.0)
Platelets: 225 10*3/uL (ref 150–400)
RBC: 3.84 MIL/uL — ABNORMAL LOW (ref 4.22–5.81)
RDW: 12.3 % (ref 11.5–15.5)
WBC: 11.4 10*3/uL — ABNORMAL HIGH (ref 4.0–10.5)

## 2014-09-11 LAB — PROTIME-INR
INR: 1.15 (ref 0.00–1.49)
Prothrombin Time: 14.8 seconds (ref 11.6–15.2)

## 2014-09-11 MED ORDER — INSULIN GLARGINE 100 UNIT/ML ~~LOC~~ SOLN
25.0000 [IU] | Freq: Every day | SUBCUTANEOUS | Status: DC
Start: 2014-09-11 — End: 2014-09-12
  Administered 2014-09-11 – 2014-09-12 (×2): 25 [IU] via SUBCUTANEOUS
  Filled 2014-09-11 (×2): qty 0.25

## 2014-09-11 MED ORDER — DIPHENHYDRAMINE HCL 25 MG PO CAPS
25.0000 mg | ORAL_CAPSULE | Freq: Four times a day (QID) | ORAL | Status: DC
  Administered 2014-09-11: 25 mg via ORAL
  Filled 2014-09-11 (×3): qty 1

## 2014-09-11 MED ORDER — PREDNISONE 20 MG PO TABS
20.0000 mg | ORAL_TABLET | Freq: Every day | ORAL | Status: DC
  Administered 2014-09-12: 20 mg via ORAL
  Filled 2014-09-11: qty 1

## 2014-09-11 NOTE — Progress Notes (Signed)
UR completed 

## 2014-09-11 NOTE — Progress Notes (Signed)
[facility] PROGRESS NOTE  Shawn Collins [id number] DOB: [birth year] DOA: 09/10/2014 PCP: Philis Fendt, MD  Assessment/Plan: Principal Problem:   Viral gastroenteritis Active Problems:   COPD exacerbation   COPD (chronic obstructive pulmonary disease)   Hypoxia   Dizzy-->?Orthostatic?   Diabetes mellitus without complication   Hypertension   Emphysema lung    Nausea vomiting likely related to viral gastroenteritis, improving Continue IV hydration Patient tolerating solid diet, no further nausea vomiting this morning  Acute on chronic kidney disease stage II Continue IV hydration with significant improvement of creatinine overnight  COPD stable, chest x-ray 2 negative Taper prednisone  Uncontrolled diabetes Increase Lantus to 25 units and continue SSI  Hypertension Stable, patient was dehydrated initially with acute renal failure Continue to hold ACE inhibitor   Code Status: full Family Communication: family updated about patient's clinical progress Disposition Plan:  Anticipate discharge tomorrow  Brief narrative: 64 y/o ? Prior history EGD/colonoscopy with pyloric ulceration 06/2010, COPD, history DM T July 2, history HTN, Previously in good health started feeling poorly for/10/16. States he felt "bad", states he's had episodic nausea and vomiting times the past 2 days and has not been able to keep anything down. Associated with this, + lightheadedness, + dizziness, - able to ambulate to the wall, - chest pain, - rash, - L contacts, - outside food, - travel, - immunization for flu recently. He states he has not really been able to make it out and has had also some discomfort with coughing and with sputum. He does not state what color the sputum once. He lives alone and does not smoke and quit at the age of 64 however has been exposed to his brother and another relative who smoked heavily and has been diagnosed in past with emphysema by his primary care  physician. He tells me that he is disabled secondary to diabetes mellitus and hypertension.  Emergency room workup revealed potassium 5.3, bicarbonate 18 BUN/creatinine 36/1.73, [this appears to be his baseline] BNP 25, point Troponin 0.01 Glucose 176, two-view chest x-ray should confirm COPD however no other issue  Consultants:  None  Procedures:  None  Antibiotics: None  HPI/Subjective: Patient is not nauseous this morning and tolerating a solid diet for breakfast  Objective: Filed Vitals:   09/10/14 2100 09/10/14 2150 09/11/14 0525 09/11/14 1000  BP: 169/76  157/82 163/68  Pulse: 81  67 70  Temp: 99.1 F (37.3 C)  98 F (36.7 C) 97.8 F (36.6 C)  TempSrc: Oral  Oral Oral  Resp: 18  18 17   Height:      Weight:      SpO2: 100% 98% 97% 100%    Intake/Output Summary (Last 24 hours) at 09/11/14 1201 Last data filed at 09/11/14 1144  Gross per 24 hour  Intake 1984.05 ml  Output   1701 ml  Net 283.05 ml    Exam:  General: No acute respiratory distress Lungs: Clear to auscultation bilaterally without wheezes or crackles Cardiovascular: Regular rate and rhythm without murmur gallop or rub normal S1 and S2 Abdomen: Nontender, nondistended, soft, bowel sounds positive, no rebound, no ascites, no appreciable mass Extremities: No significant cyanosis, clubbing, or edema bilateral lower extremities      Data Reviewed: Basic Metabolic Panel:  Recent Labs Lab 09/10/14 0614 09/10/14 1212 09/10/14 2107 09/11/14 0420  NA 133*  --  135 139  K 5.3*  --  5.3* 4.4  CL 99  --  100 108  CO2 18*  --  24 24  GLUCOSE 176*  --  445* 103*  BUN 36*  --  47* 40*  CREATININE 1.73* 2.19* 2.03* 1.65*  CALCIUM 9.0  --  9.2 8.8    Liver Function Tests:  Recent Labs Lab 09/11/14 0420  AST 27  ALT 22  ALKPHOS 38*  BILITOT 0.6  PROT 5.6*  ALBUMIN 2.8*   No results for input(s): LIPASE, AMYLASE in the last 168 hours. No results for input(s): AMMONIA in the last 168  hours.  CBC:  Recent Labs Lab 09/10/14 0614 09/10/14 1212 09/11/14 0420  WBC 6.2 6.7 11.4*  HGB 13.2 12.3* 11.3*  HCT 39.1 37.0* 33.5*  MCV 87.9 90.0 87.2  PLT 243 208 225    Cardiac Enzymes: No results for input(s): CKTOTAL, CKMB, CKMBINDEX, TROPONINI in the last 168 hours. BNP (last 3 results)  Recent Labs  09/10/14 0614  BNP 25.2    ProBNP (last 3 results) No results for input(s): PROBNP in the last 8760 hours.    CBG:  Recent Labs Lab 09/10/14 1831 09/10/14 1943 09/10/14 2313 09/11/14 0743 09/11/14 0918  GLUCAP 517* 568* 308* 123* 132*    No results found for this or any previous visit (from the past 240 hour(s)).   Studies: Dg Chest 2 View  09/11/2014   CLINICAL DATA:  Shortness of Breath  EXAM: CHEST  2 VIEW  COMPARISON:  09/10/2014  FINDINGS: Cardiac shadow is stable. The lungs are again hyperinflated. No focal infiltrate or sizable effusion is noted. No bony abnormality is seen.  IMPRESSION: COPD without acute abnormality no change from the prior exam.   Electronically Signed   By: Inez Catalina M.D.   On: 09/11/2014 09:00   Dg Chest 2 View  09/10/2014   CLINICAL DATA:  Shortness of breath, weakness for 4 days. History of COPD.  EXAM: CHEST  2 VIEW  COMPARISON:  None.  FINDINGS: Cardiomediastinal silhouette is unremarkable. Mildly increased lung volumes which can be seen with patient's known COPD. The lungs are clear without pleural effusions or focal consolidations. Trachea projects midline and there is no pneumothorax. Soft tissue planes and included osseous structures are non-suspicious. Mild upper thoracic dextroscoliosis. Nipple shadow LEFT lung base.  IMPRESSION: No acute cardiopulmonary process.   Electronically Signed   By: Elon Alas   On: 09/10/2014 06:27    Scheduled Meds: . diphenhydrAMINE  25 mg Oral 4 times per day  . heparin  5,000 Units Subcutaneous 3 times per day  . hydrALAZINE  10 mg Oral 3 times per day  . insulin aspart  0-9  Units Subcutaneous TID WC  . insulin aspart  3 Units Subcutaneous TID WC  . insulin glargine  25 Units Subcutaneous Daily  . pneumococcal 23 valent vaccine  0.5 mL Intramuscular Tomorrow-1000  . predniSONE  50 mg Oral Q breakfast  . simvastatin  10 mg Oral q1800  . sodium chloride  3 mL Intravenous Q12H   Continuous Infusions: . sodium chloride 125 mL/hr at 09/11/14 1144    Principal Problem:   Viral gastroenteritis Active Problems:   COPD exacerbation   COPD (chronic obstructive pulmonary disease)   Hypoxia   Dizzy-->?Orthostatic?   Diabetes mellitus without complication   Hypertension   Emphysema lung    Time spent: 40 minutes   [facility] Pager [phone]. If 7PM-7AM, please contact night-coverage at [email or web address], password [facility] 09/11/2014, 12:01 PM

## 2014-09-12 DIAGNOSIS — J441 Chronic obstructive pulmonary disease with (acute) exacerbation: Secondary | ICD-10-CM | POA: Diagnosis not present

## 2014-09-12 LAB — COMPREHENSIVE METABOLIC PANEL
ALT: 22 U/L (ref 0–53)
AST: 29 U/L (ref 0–37)
Albumin: 2.6 g/dL — ABNORMAL LOW (ref 3.5–5.2)
Alkaline Phosphatase: 42 U/L (ref 39–117)
Anion gap: 9 (ref 5–15)
BUN: 28 mg/dL — AB (ref 6–23)
CHLORIDE: 110 mmol/L (ref 96–112)
CO2: 20 mmol/L (ref 19–32)
Calcium: 8.5 mg/dL (ref 8.4–10.5)
Creatinine, Ser: 1.33 mg/dL (ref 0.50–1.35)
GFR calc Af Amer: 63 mL/min — ABNORMAL LOW (ref >90)
GFR calc non Af Amer: 55 mL/min — ABNORMAL LOW (ref >90)
Glucose, Bld: 129 mg/dL — ABNORMAL HIGH (ref 70–99)
POTASSIUM: 4.9 mmol/L (ref 3.5–5.1)
Sodium: 139 mmol/L (ref 135–145)
TOTAL PROTEIN: 5.2 g/dL — AB (ref 6.0–8.3)
Total Bilirubin: 0.4 mg/dL (ref 0.3–1.2)

## 2014-09-12 LAB — CBC
HCT: 32.9 % — ABNORMAL LOW (ref 39.0–52.0)
Hemoglobin: 10.9 g/dL — ABNORMAL LOW (ref 13.0–17.0)
MCH: 29.1 pg (ref 26.0–34.0)
MCHC: 33.1 g/dL (ref 30.0–36.0)
MCV: 87.7 fL (ref 78.0–100.0)
Platelets: 231 10*3/uL (ref 150–400)
RBC: 3.75 MIL/uL — ABNORMAL LOW (ref 4.22–5.81)
RDW: 12.4 % (ref 11.5–15.5)
WBC: 13.1 10*3/uL — ABNORMAL HIGH (ref 4.0–10.5)

## 2014-09-12 LAB — GLUCOSE, CAPILLARY: Glucose-Capillary: 120 mg/dL — ABNORMAL HIGH (ref 70–99)

## 2014-09-12 LAB — MAGNESIUM: Magnesium: 1.9 mg/dL (ref 1.5–2.5)

## 2014-09-12 MED ORDER — AMLODIPINE BESYLATE 10 MG PO TABS: 10.0000 mg | ORAL_TABLET | Freq: Every day | ORAL | Status: DC

## 2014-09-12 MED ORDER — PROMETHAZINE HCL 12.5 MG PO TABS: 12.5000 mg | ORAL_TABLET | Freq: Four times a day (QID) | ORAL | Status: DC | PRN

## 2014-09-12 MED ORDER — AMLODIPINE BESYLATE 10 MG PO TABS
10.0000 mg | ORAL_TABLET | Freq: Every day | ORAL | Status: DC
  Administered 2014-09-12: 10 mg via ORAL
  Filled 2014-09-12: qty 1

## 2014-09-12 NOTE — Discharge Summary (Signed)
Physician Discharge Summary  Shawn Collins MRN: [id number] DOB/AGE: [birth year] 64 y.o.  PCP: Philis Fendt, MD   Admit date: 09/10/2014 Discharge date: 09/12/2014  Discharge Diagnoses:      Viral gastroenteritis Acute kidney injury secondary to prerenal etiology   COPD exacerbation   COPD (chronic obstructive pulmonary disease)   Hypoxia   Dizzy-->?Orthostatic?   Diabetes mellitus without complication   Hypertension   Emphysema lung  Follow-up recommendations PCP in 5-7 days CBC, CMP in 1 week Resume lisinopril if renal function remained stable and DC Norvasc     Medication List    STOP taking these medications        lisinopril 20 MG tablet  Commonly known as:  PRINIVIL,ZESTRIL      TAKE these medications        amLODipine 10 MG tablet  Commonly known as:  NORVASC  Take 1 tablet (10 mg total) by mouth daily.     diphenhydrAMINE 25 MG tablet  Commonly known as:  BENADRYL  Take 1 tablet (25 mg total) by mouth every 6 (six) hours.     glimepiride 4 MG tablet  Commonly known as:  AMARYL  Take 4 mg by mouth daily with breakfast.     hydrocortisone cream 1 %  Apply to affected area 2 times daily     promethazine 12.5 MG tablet  Commonly known as:  PHENERGAN  Take 1 tablet (12.5 mg total) by mouth every 6 (six) hours as needed for nausea.     simvastatin 10 MG tablet  Commonly known as:  ZOCOR  Take 10 mg by mouth daily.        Discharge Condition: Stable Disposition: 01-Home or Self Care   Consults: None  Significant Diagnostic Studies: Dg Chest 2 View  09/11/2014   CLINICAL DATA:  Shortness of Breath  EXAM: CHEST  2 VIEW  COMPARISON:  09/10/2014  FINDINGS: Cardiac shadow is stable. The lungs are again hyperinflated. No focal infiltrate or sizable effusion is noted. No bony abnormality is seen.  IMPRESSION: COPD without acute abnormality no change from the prior exam.   Electronically Signed   By: Inez Catalina M.D.   On: 09/11/2014 09:00    Dg Chest 2 View  09/10/2014   CLINICAL DATA:  Shortness of breath, weakness for 4 days. History of COPD.  EXAM: CHEST  2 VIEW  COMPARISON:  None.  FINDINGS: Cardiomediastinal silhouette is unremarkable. Mildly increased lung volumes which can be seen with patient's known COPD. The lungs are clear without pleural effusions or focal consolidations. Trachea projects midline and there is no pneumothorax. Soft tissue planes and included osseous structures are non-suspicious. Mild upper thoracic dextroscoliosis. Nipple shadow LEFT lung base.  IMPRESSION: No acute cardiopulmonary process.   Electronically Signed   By: Elon Alas   On: 09/10/2014 06:27      Microbiology: No results found for this or any previous visit (from the past 240 hour(s)).   Labs: Results for orders placed or performed during the hospital encounter of 09/10/14 (from the past 48 hour(s))  Glucose, capillary     Status: Abnormal   Collection Time: 09/10/14 11:46 AM  Result Value Ref Range   Glucose-Capillary 460 (H) 70 - 99 mg/dL   Comment 1 Notify RN   CBC     Status: Abnormal   Collection Time: 09/10/14 12:12 PM  Result Value Ref Range   WBC 6.7 4.0 - 10.5 K/uL   RBC 4.11 (L) 4.22 - 5.81  MIL/uL   Hemoglobin 12.3 (L) 13.0 - 17.0 g/dL   HCT 37.0 (L) 39.0 - 52.0 %   MCV 90.0 78.0 - 100.0 fL   MCH 29.9 26.0 - 34.0 pg   MCHC 33.2 30.0 - 36.0 g/dL   RDW 12.5 11.5 - 15.5 %   Platelets 208 150 - 400 K/uL  Creatinine, serum     Status: Abnormal   Collection Time: 09/10/14 12:12 PM  Result Value Ref Range   Creatinine, Ser 2.19 (H) 0.50 - 1.35 mg/dL   GFR calc non Af Amer 30 (L) >90 mL/min   GFR calc Af Amer 35 (L) >90 mL/min    Comment: (NOTE) The eGFR has been calculated using the CKD EPI equation. This calculation has not been validated in all clinical situations. eGFR's persistently <90 mL/min signify possible Chronic Kidney Disease.   Glucose, capillary     Status: Abnormal   Collection Time: 09/10/14   1:06 PM  Result Value Ref Range   Glucose-Capillary 489 (H) 70 - 99 mg/dL   Comment 1 Notify RN   Glucose, capillary     Status: Abnormal   Collection Time: 09/10/14  2:19 PM  Result Value Ref Range   Glucose-Capillary 453 (H) 70 - 99 mg/dL   Comment 1 Notify RN   Glucose, capillary     Status: Abnormal   Collection Time: 09/10/14  5:09 PM  Result Value Ref Range   Glucose-Capillary 526 (H) 70 - 99 mg/dL   Comment 1 Notify RN   Glucose, capillary     Status: Abnormal   Collection Time: 09/10/14  6:31 PM  Result Value Ref Range   Glucose-Capillary 517 (H) 70 - 99 mg/dL   Comment 1 Notify RN   Glucose, capillary     Status: Abnormal   Collection Time: 09/10/14  7:43 PM  Result Value Ref Range   Glucose-Capillary 568 (HH) 70 - 99 mg/dL   Comment 1 Document in Chart   Basic metabolic panel     Status: Abnormal   Collection Time: 09/10/14  9:07 PM  Result Value Ref Range   Sodium 135 135 - 145 mmol/L   Potassium 5.3 (H) 3.5 - 5.1 mmol/L   Chloride 100 96 - 112 mmol/L   CO2 24 19 - 32 mmol/L   Glucose, Bld 445 (H) 70 - 99 mg/dL   BUN 47 (H) 6 - 23 mg/dL   Creatinine, Ser 2.03 (H) 0.50 - 1.35 mg/dL   Calcium 9.2 8.4 - 10.5 mg/dL   GFR calc non Af Amer 33 (L) >90 mL/min   GFR calc Af Amer 38 (L) >90 mL/min    Comment: (NOTE) The eGFR has been calculated using the CKD EPI equation. This calculation has not been validated in all clinical situations. eGFR's persistently <90 mL/min signify possible Chronic Kidney Disease.    Anion gap 11 5 - 15  Glucose, capillary     Status: Abnormal   Collection Time: 09/10/14 11:13 PM  Result Value Ref Range   Glucose-Capillary 308 (H) 70 - 99 mg/dL  Comprehensive metabolic panel     Status: Abnormal   Collection Time: 09/11/14  4:20 AM  Result Value Ref Range   Sodium 139 135 - 145 mmol/L   Potassium 4.4 3.5 - 5.1 mmol/L    Comment: DELTA CHECK NOTED   Chloride 108 96 - 112 mmol/L   CO2 24 19 - 32 mmol/L   Glucose, Bld 103 (H) 70 - 99  mg/dL  BUN 40 (H) 6 - 23 mg/dL   Creatinine, Ser 1.65 (H) 0.50 - 1.35 mg/dL   Calcium 8.8 8.4 - 10.5 mg/dL   Total Protein 5.6 (L) 6.0 - 8.3 g/dL   Albumin 2.8 (L) 3.5 - 5.2 g/dL   AST 27 0 - 37 U/L   ALT 22 0 - 53 U/L   Alkaline Phosphatase 38 (L) 39 - 117 U/L   Total Bilirubin 0.6 0.3 - 1.2 mg/dL   GFR calc non Af Amer 42 (L) >90 mL/min   GFR calc Af Amer 49 (L) >90 mL/min    Comment: (NOTE) The eGFR has been calculated using the CKD EPI equation. This calculation has not been validated in all clinical situations. eGFR's persistently <90 mL/min signify possible Chronic Kidney Disease.    Anion gap 7 5 - 15  CBC     Status: Abnormal   Collection Time: 09/11/14  4:20 AM  Result Value Ref Range   WBC 11.4 (H) 4.0 - 10.5 K/uL   RBC 3.84 (L) 4.22 - 5.81 MIL/uL   Hemoglobin 11.3 (L) 13.0 - 17.0 g/dL   HCT 33.5 (L) 39.0 - 52.0 %   MCV 87.2 78.0 - 100.0 fL   MCH 29.4 26.0 - 34.0 pg   MCHC 33.7 30.0 - 36.0 g/dL   RDW 12.3 11.5 - 15.5 %   Platelets 225 150 - 400 K/uL  Protime-INR     Status: None   Collection Time: 09/11/14  4:20 AM  Result Value Ref Range   Prothrombin Time 14.8 11.6 - 15.2 seconds   INR 1.15 0.00 - 1.49  Glucose, capillary     Status: Abnormal   Collection Time: 09/11/14  7:43 AM  Result Value Ref Range   Glucose-Capillary 123 (H) 70 - 99 mg/dL  Glucose, capillary     Status: Abnormal   Collection Time: 09/11/14  9:18 AM  Result Value Ref Range   Glucose-Capillary 132 (H) 70 - 99 mg/dL  Glucose, capillary     Status: Abnormal   Collection Time: 09/11/14 12:01 PM  Result Value Ref Range   Glucose-Capillary 205 (H) 70 - 99 mg/dL  Glucose, capillary     Status: Abnormal   Collection Time: 09/11/14  5:05 PM  Result Value Ref Range   Glucose-Capillary 288 (H) 70 - 99 mg/dL   Comment 1 Repeat Test   Glucose, capillary     Status: Abnormal   Collection Time: 09/11/14 10:06 PM  Result Value Ref Range   Glucose-Capillary 240 (H) 70 - 99 mg/dL   Comment 1  Notify RN    Comment 2 Document in Chart   Comprehensive metabolic panel     Status: Abnormal   Collection Time: 09/12/14  4:30 AM  Result Value Ref Range   Sodium 139 135 - 145 mmol/L   Potassium 4.9 3.5 - 5.1 mmol/L   Chloride 110 96 - 112 mmol/L   CO2 20 19 - 32 mmol/L   Glucose, Bld 129 (H) 70 - 99 mg/dL   BUN 28 (H) 6 - 23 mg/dL   Creatinine, Ser 1.33 0.50 - 1.35 mg/dL   Calcium 8.5 8.4 - 10.5 mg/dL   Total Protein 5.2 (L) 6.0 - 8.3 g/dL   Albumin 2.6 (L) 3.5 - 5.2 g/dL   AST 29 0 - 37 U/L   ALT 22 0 - 53 U/L   Alkaline Phosphatase 42 39 - 117 U/L   Total Bilirubin 0.4 0.3 - 1.2 mg/dL   GFR  calc non Af Amer 55 (L) >90 mL/min   GFR calc Af Amer 63 (L) >90 mL/min    Comment: (NOTE) The eGFR has been calculated using the CKD EPI equation. This calculation has not been validated in all clinical situations. eGFR's persistently <90 mL/min signify possible Chronic Kidney Disease.    Anion gap 9 5 - 15  Magnesium     Status: None   Collection Time: 09/12/14  4:30 AM  Result Value Ref Range   Magnesium 1.9 1.5 - 2.5 mg/dL  CBC     Status: Abnormal   Collection Time: 09/12/14  4:30 AM  Result Value Ref Range   WBC 13.1 (H) 4.0 - 10.5 K/uL   RBC 3.75 (L) 4.22 - 5.81 MIL/uL   Hemoglobin 10.9 (L) 13.0 - 17.0 g/dL   HCT 32.9 (L) 39.0 - 52.0 %   MCV 87.7 78.0 - 100.0 fL   MCH 29.1 26.0 - 34.0 pg   MCHC 33.1 30.0 - 36.0 g/dL   RDW 12.4 11.5 - 15.5 %   Platelets 231 150 - 400 K/uL  Glucose, capillary     Status: Abnormal   Collection Time: 09/12/14  7:47 AM  Result Value Ref Range   Glucose-Capillary 120 (H) 70 - 99 mg/dL     HPI :64 y/o ? Prior history EGD/colonoscopy with pyloric ulceration 06/2010, COPD, history DM T July 2, history HTN, Previously in good health started feeling poorly for/10/16. States he felt "bad", states he's had episodic nausea and vomiting times the past 2 days and has not been able to keep anything down. Associated with this, + lightheadedness, +  dizziness, - able to ambulate to the wall, - chest pain, - rash, - L contacts, - outside food, - travel, - immunization for flu recently. He states he has not really been able to make it out and has had also some discomfort with coughing and with sputum. He lives alone and does not smoke and quit at the age of 13 however has been exposed to his brother and another relative who smoked heavily and has been diagnosed in past with emphysema by his primary care physician. He tells me that he is disabled secondary to diabetes mellitus and hypertension.  Emergency room workup revealed potassium 5.3, bicarbonate 18 BUN/creatinine 36/1.73, [this appears to be his baseline] BNP 25, point Troponin 0.01 Glucose 176,   HOSPITAL COURSE:   Nausea vomiting likely related to viral gastroenteritis, improving Continue IV hydration Patient tolerating solid diet, no further nausea vomiting this morning  Acute on chronic kidney disease stage II Continue IV hydration with significant improvement of creatinine overnight Creatinine 2.19 on admission, 1.3 prior to discharge   COPD stable, chest x-ray 2 negative Initiated on prednisone, tapered quickly as the patient did not have any wheezing  Uncontrolled diabetes Initiated on Lantus and sliding scale insulin Patient did have increase in his CBG after being treated with steroids in the hospital Now improved, patient can continue with his home medications, no need for insulin at this time   Hypertension Stable, patient was dehydrated initially with acute renal failure Lisinopril discontinued, instead the patient has been switched to Norvasc Patient can resume his lisinopril if his renal function is stable upon follow-up    Discharge Exam:    Blood pressure 184/87, pulse 66, temperature 97.8 F (36.6 C), temperature source Oral, resp. rate 16, height _0  (1.803 m), weight 58.514 kg (129 lb), SpO2 100 %. General: No acute respiratory distress Lungs:  Clear to auscultation bilaterally without wheezes or crackles Cardiovascular: Regular rate and rhythm without murmur gallop or rub normal S1 and S2 Abdomen: Nontender, nondistended, soft, bowel sounds positive, no rebound, no ascites, no appreciable mass Extremities: No significant cyanosis, clubbing, or edema bilateral lower extremities        Discharge Instructions    Diet - low sodium heart healthy    Complete by:  As directed      Diet - low sodium heart healthy    Complete by:  As directed      Increase activity slowly    Complete by:  As directed      Increase activity slowly    Complete by:  As directed            Follow-up Information    Follow up with AVBUERE,EDWIN A, MD. Schedule an appointment as soon as possible for a visit in 3 days.   Specialty:  Internal Medicine   Contact information:   [place] [address] [phone]       Signed: Reyne Dumas 09/12/2014, 10:16 AM

## 2014-09-12 NOTE — Progress Notes (Signed)
Discussed discharge summary with patient. Reviewed all medications with patient. All of patient questions answered appropriately. Patient ready for discharge.

## 2016-09-27 DIAGNOSIS — N183 Chronic kidney disease, stage 3 unspecified: Secondary | ICD-10-CM

## 2016-09-27 HISTORY — DX: Chronic kidney disease, stage 3 unspecified: N18.30

## 2016-10-15 ENCOUNTER — Inpatient Hospital Stay (HOSPITAL_COMMUNITY)
Admission: EM | Admit: 2016-10-15 | Discharge: 2016-11-04 | DRG: 234 | Disposition: A | Discharge status: Skilled Nursing Facility | Payer: Medicare Other | Attending: Cardiothoracic Surgery | Admitting: Cardiothoracic Surgery

## 2016-10-15 ENCOUNTER — Encounter (HOSPITAL_COMMUNITY): Payer: Self-pay | Admitting: Emergency Medicine

## 2016-10-15 DIAGNOSIS — R042 Hemoptysis: Secondary | ICD-10-CM

## 2016-10-15 DIAGNOSIS — E1122 Type 2 diabetes mellitus with diabetic chronic kidney disease: Secondary | ICD-10-CM | POA: Diagnosis present

## 2016-10-15 DIAGNOSIS — J449 Chronic obstructive pulmonary disease, unspecified: Secondary | ICD-10-CM | POA: Diagnosis present

## 2016-10-15 DIAGNOSIS — R7989 Other specified abnormal findings of blood chemistry: Secondary | ICD-10-CM | POA: Diagnosis present

## 2016-10-15 DIAGNOSIS — T45515A Adverse effect of anticoagulants, initial encounter: Secondary | ICD-10-CM | POA: Diagnosis not present

## 2016-10-15 DIAGNOSIS — E1165 Type 2 diabetes mellitus with hyperglycemia: Secondary | ICD-10-CM | POA: Diagnosis present

## 2016-10-15 DIAGNOSIS — R110 Nausea: Secondary | ICD-10-CM | POA: Diagnosis not present

## 2016-10-15 DIAGNOSIS — Z79899 Other long term (current) drug therapy: Secondary | ICD-10-CM

## 2016-10-15 DIAGNOSIS — N189 Chronic kidney disease, unspecified: Secondary | ICD-10-CM

## 2016-10-15 DIAGNOSIS — D62 Acute posthemorrhagic anemia: Secondary | ICD-10-CM | POA: Diagnosis not present

## 2016-10-15 DIAGNOSIS — N2581 Secondary hyperparathyroidism of renal origin: Secondary | ICD-10-CM | POA: Diagnosis present

## 2016-10-15 DIAGNOSIS — N184 Chronic kidney disease, stage 4 (severe): Secondary | ICD-10-CM | POA: Diagnosis present

## 2016-10-15 DIAGNOSIS — I255 Ischemic cardiomyopathy: Secondary | ICD-10-CM | POA: Diagnosis present

## 2016-10-15 DIAGNOSIS — Z9689 Presence of other specified functional implants: Secondary | ICD-10-CM

## 2016-10-15 DIAGNOSIS — I2510 Atherosclerotic heart disease of native coronary artery without angina pectoris: Secondary | ICD-10-CM | POA: Diagnosis present

## 2016-10-15 DIAGNOSIS — Z951 Presence of aortocoronary bypass graft: Secondary | ICD-10-CM

## 2016-10-15 DIAGNOSIS — A084 Viral intestinal infection, unspecified: Secondary | ICD-10-CM | POA: Diagnosis present

## 2016-10-15 DIAGNOSIS — I214 Non-ST elevation (NSTEMI) myocardial infarction: Principal | ICD-10-CM | POA: Diagnosis present

## 2016-10-15 DIAGNOSIS — E1121 Type 2 diabetes mellitus with diabetic nephropathy: Secondary | ICD-10-CM | POA: Diagnosis present

## 2016-10-15 DIAGNOSIS — D631 Anemia in chronic kidney disease: Secondary | ICD-10-CM | POA: Diagnosis present

## 2016-10-15 DIAGNOSIS — Z823 Family history of stroke: Secondary | ICD-10-CM

## 2016-10-15 DIAGNOSIS — Z87891 Personal history of nicotine dependence: Secondary | ICD-10-CM

## 2016-10-15 DIAGNOSIS — R040 Epistaxis: Secondary | ICD-10-CM | POA: Diagnosis not present

## 2016-10-15 DIAGNOSIS — J90 Pleural effusion, not elsewhere classified: Secondary | ICD-10-CM | POA: Diagnosis not present

## 2016-10-15 DIAGNOSIS — I129 Hypertensive chronic kidney disease with stage 1 through stage 4 chronic kidney disease, or unspecified chronic kidney disease: Secondary | ICD-10-CM | POA: Diagnosis present

## 2016-10-15 DIAGNOSIS — R112 Nausea with vomiting, unspecified: Secondary | ICD-10-CM | POA: Diagnosis present

## 2016-10-15 DIAGNOSIS — I161 Hypertensive emergency: Secondary | ICD-10-CM | POA: Diagnosis present

## 2016-10-15 DIAGNOSIS — N183 Chronic kidney disease, stage 3 unspecified: Secondary | ICD-10-CM | POA: Diagnosis present

## 2016-10-15 DIAGNOSIS — Z09 Encounter for follow-up examination after completed treatment for conditions other than malignant neoplasm: Secondary | ICD-10-CM

## 2016-10-15 DIAGNOSIS — R9439 Abnormal result of other cardiovascular function study: Secondary | ICD-10-CM | POA: Diagnosis not present

## 2016-10-15 DIAGNOSIS — R109 Unspecified abdominal pain: Secondary | ICD-10-CM

## 2016-10-15 DIAGNOSIS — Z794 Long term (current) use of insulin: Secondary | ICD-10-CM

## 2016-10-15 DIAGNOSIS — Z8249 Family history of ischemic heart disease and other diseases of the circulatory system: Secondary | ICD-10-CM

## 2016-10-15 DIAGNOSIS — E119 Type 2 diabetes mellitus without complications: Secondary | ICD-10-CM

## 2016-10-15 DIAGNOSIS — I10 Essential (primary) hypertension: Secondary | ICD-10-CM | POA: Diagnosis present

## 2016-10-15 DIAGNOSIS — Z833 Family history of diabetes mellitus: Secondary | ICD-10-CM

## 2016-10-15 DIAGNOSIS — N179 Acute kidney failure, unspecified: Secondary | ICD-10-CM

## 2016-10-15 DIAGNOSIS — R778 Other specified abnormalities of plasma proteins: Secondary | ICD-10-CM | POA: Diagnosis present

## 2016-10-15 DIAGNOSIS — E785 Hyperlipidemia, unspecified: Secondary | ICD-10-CM | POA: Diagnosis present

## 2016-10-15 HISTORY — DX: Chronic kidney disease, stage 3 (moderate): N18.3

## 2016-10-15 LAB — CBC
HCT: 39.3 % (ref 39.0–52.0)
HEMOGLOBIN: 13.3 g/dL (ref 13.0–17.0)
MCH: 29.6 pg (ref 26.0–34.0)
MCHC: 33.8 g/dL (ref 30.0–36.0)
MCV: 87.5 fL (ref 78.0–100.0)
PLATELETS: 268 10*3/uL (ref 150–400)
RBC: 4.49 MIL/uL (ref 4.22–5.81)
RDW: 12.5 % (ref 11.5–15.5)
WBC: 6.9 10*3/uL (ref 4.0–10.5)

## 2016-10-15 LAB — I-STAT TROPONIN, ED: TROPONIN I, POC: 0.35 ng/mL — AB (ref 0.00–0.08)

## 2016-10-15 LAB — CBG MONITORING, ED: Glucose-Capillary: 234 mg/dL — ABNORMAL HIGH (ref 65–99)

## 2016-10-15 MED ORDER — ONDANSETRON HCL 4 MG/2ML IJ SOLN
4.0000 mg | Freq: Once | INTRAMUSCULAR | Status: AC | PRN
  Administered 2016-10-15: 4 mg via INTRAVENOUS
  Filled 2016-10-15: qty 2

## 2016-10-15 MED ORDER — SODIUM CHLORIDE 0.9 % IV BOLUS (SEPSIS)
1000.0000 mL | Freq: Once | INTRAVENOUS | Status: AC
Start: 2016-10-15 — End: 2016-10-16
  Administered 2016-10-15: 1000 mL via INTRAVENOUS

## 2016-10-15 NOTE — ED Triage Notes (Signed)
Pt comes in via EMS c/o N/V and weakness for two days. Unable to keep fluids or PO medications down

## 2016-10-15 NOTE — ED Notes (Signed)
Checked CBG 234, RN informed

## 2016-10-15 NOTE — ED Provider Notes (Signed)
[name] DEPT Provider Note   CSN: [id number] Arrival date & time: 10/15/16  2322  By signing my name below, I, Lise Auer, attest that this documentation has been prepared under the direction and in the presence of Arelys Glassco, Barbette Hair, MD. Electronically Signed: Lise Auer, ED Scribe. 10/16/16. 2:42 AM.   History   Chief Complaint Chief Complaint  Patient presents with  . Nausea  . Emesis  . Weakness   LEVEL V CAVEAT: HPI and ROS limited due to unable to verbalize due to continuous vomiting.   The history is provided by the patient. No language interpreter was used.    HPI Comments: Shawn Collins is a 66 y.o. male with a PMHx of DM, HLD, HTN, and COPD, brought in by ambulance, who presents to the Emergency Department complaining of gradually worsening, constant generalized weakness that started two days ago. His associated symptoms include nausea and vomiting. Denies abdominal pain, diarrhea, chest pain, or shortness of breath. Patient is very uncomfortable on initial evaluation. Difficult to obtain history from.  After patient treated with antinausea medication. He continues to deny chest pain, shortness of breath, abdominal pain over the last 2 days. Reports generalized weakness and nonbilious, nonbloody emesis 2 days. History of diabetes, hypertension, hyperlipidemia. No known history of heart disease.   Past Medical History:  Diagnosis Date  . Diabetes mellitus without complication ([place])   . Emphysema lung ([place])   . Hyperlipidemia   . Hypertension     Patient Active Problem List   Diagnosis Date Noted  . COPD exacerbation ([place]) 09/10/2014  . COPD (chronic obstructive pulmonary disease) ([place]) 09/10/2014  . Viral gastroenteritis 09/10/2014  . Hypoxia 09/10/2014  . Dizzy-->?Orthostatic? 09/10/2014  . Diabetes mellitus without complication ([place]) 98/33/8250  . Hypertension 09/10/2014  . Emphysema lung ([place]) 09/10/2014    History reviewed. No pertinent surgical  history.     Home Medications    Prior to Admission medications   Medication Sig Start Date End Date Taking? Authorizing Provider  glimepiride (AMARYL) 4 MG tablet Take 4 mg by mouth daily with breakfast.  09/07/14  Yes [provider]  lisinopril (PRINIVIL,ZESTRIL) 20 MG tablet Take 20 mg by mouth daily.   Yes [provider]  amLODipine (NORVASC) 10 MG tablet Take 1 tablet (10 mg total) by mouth daily. Patient not taking: Reported on 10/16/2016 09/12/14   Reyne Dumas, MD  diphenhydrAMINE (BENADRYL) 25 MG tablet Take 1 tablet (25 mg total) by mouth every 6 (six) hours. Patient not taking: Reported on 09/10/2014 03/07/13   Hazel Sams, PA-C  hydrocortisone cream 1 % Apply to affected area 2 times daily Patient not taking: Reported on 09/10/2014 03/07/13   Hazel Sams, PA-C  promethazine (PHENERGAN) 12.5 MG tablet Take 1 tablet (12.5 mg total) by mouth every 6 (six) hours as needed for nausea. Patient not taking: Reported on 10/16/2016 09/12/14   Reyne Dumas, MD    Family History No family history on file.  Social History Social History  Substance Use Topics  . Smoking status: Former Research scientist (life sciences)  . Smokeless tobacco: Not on file  . Alcohol use No     Allergies   Patient has no known allergies.   Review of Systems Review of Systems  Unable to perform ROS: Acuity of condition  Respiratory: Negative for chest tightness and shortness of breath.   Cardiovascular: Negative for chest pain.  Gastrointestinal: Positive for diarrhea, nausea and vomiting. Negative for abdominal pain.  Neurological: Positive for weakness.  All other systems  reviewed and are negative.   LEVEL V CAVEAT: HPI and ROS limited due to unable to verbalize due to continuous vomiting.   Physical Exam Updated Vital Signs BP (!) 187/95   Pulse 72   Temp 97.6 F (36.4 C) (Oral)   Resp (!) 0   Ht 5\' 10"  (1.778 m)   Wt 140 lb (63.5 kg)   SpO2 98%   BMI 20.09 kg/m   Physical Exam    Constitutional: He is oriented to person, place, and time. No distress.  Ill-appearing but nontoxic, uncomfortable appearing  HENT:  Head: Normocephalic and atraumatic.  Cardiovascular: Normal rate, regular rhythm and normal heart sounds.   No murmur heard. Pulmonary/Chest: Effort normal and breath sounds normal. No respiratory distress. He has no wheezes.  Abdominal: Soft. Bowel sounds are normal. There is no tenderness. There is no rebound.  Musculoskeletal: He exhibits no edema.  Neurological: He is alert and oriented to person, place, and time.  Skin: Skin is warm.  Psychiatric: He has a normal mood and affect.  Nursing note and vitals reviewed.    ED Treatments / Results  DIAGNOSTIC STUDIES: Oxygen Saturation is 100% on RA, normal by my interpretation.   COORDINATION OF CARE: 2:42 AM-Discussed next steps with pt. Pt verbalized understanding and is agreeable with the plan.    Labs (all labs ordered are listed, but only abnormal results are displayed) Labs Reviewed  COMPREHENSIVE METABOLIC PANEL - Abnormal; Notable for the following:       Result Value   Potassium 5.8 (*)    Glucose, Bld 228 (*)    BUN 22 (*)    Creatinine, Ser 1.45 (*)    AST 48 (*)    Total Bilirubin 1.4 (*)    GFR calc non Af Amer 48 (*)    GFR calc Af Amer 56 (*)    All other components within normal limits  URINALYSIS, ROUTINE W REFLEX MICROSCOPIC - Abnormal; Notable for the following:    Color, Urine STRAW (*)    Glucose, UA >=500 (*)    Hgb urine dipstick SMALL (*)    Ketones, ur 5 (*)    Protein, ur >=300 (*)    Bacteria, UA RARE (*)    All other components within normal limits  CBG MONITORING, ED - Abnormal; Notable for the following:    Glucose-Capillary 234 (*)    All other components within normal limits  I-STAT TROPOININ, ED - Abnormal; Notable for the following:    Troponin i, poc 0.35 (*)    All other components within normal limits  LIPASE, BLOOD  CBC  POTASSIUM  TROPONIN  I  HEPARIN LEVEL (UNFRACTIONATED)    EKG  EKG Interpretation  Date/Time:  Saturday Oct 15 2016 23:29:57 EDT Ventricular Rate:  66 PR Interval:    QRS Duration: 78 QT Interval:  438 QTC Calculation: 459 R Axis:   52 Text Interpretation:  Sinus rhythm Probable left atrial enlargement Anteroseptal infarct, age indeterminate Abnormal ST changes in V2/V3 Confirmed by Thayer Jew [phone]) on 10/15/2016 11:33:17 PM       EKG Interpretation  Date/Time:  Sunday Oct 16 2016 00:09:18 EDT Ventricular Rate:  64 PR Interval:    QRS Duration: 87 QT Interval:  451 QTC Calculation: 466 R Axis:   48 Text Interpretation:  Sinus rhythm Anteroseptal infarct, age indeterminate Deep T wave inversion in V3, otherwise no dynamic changes Reconfirmed by Thayer Jew [phone]) on 10/16/2016 2:52:07 AM       EKG  Interpretation  Date/Time:  Sunday Oct 16 2016 02:46:23 EDT Ventricular Rate:  74 PR Interval:    QRS Duration: 75 QT Interval:  435 QTC Calculation: 483 R Axis:   53 Text Interpretation:  Sinus rhythm Anteroseptal infarct, age indeterminate Similar to prior Confirmed by Thayer Jew [phone]) on 10/16/2016 2:55:40 AM         Radiology Dg Chest Portable 1 View  Result Date: 10/16/2016 CLINICAL DATA:  Weakness, nausea and vomiting x2 days EXAM: PORTABLE CHEST 1 VIEW COMPARISON:  09/11/2014 CXR FINDINGS: The heart size and mediastinal contours are within normal limits. Both lungs are clear. The visualized skeletal structures are unremarkable. IMPRESSION: No active disease. Electronically Signed   By: Ashley Royalty M.D.   On: 10/16/2016 00:45    Procedures Procedures (including critical care time)  CRITICAL CARE Performed by: Merryl Hacker   Total critical care time: 40 minutes  Critical care time was exclusive of separately billable procedures and treating other patients.  Critical care was necessary to treat or prevent imminent or life-threatening  deterioration.  Critical care was time spent personally by me on the following activities: development of treatment plan with patient and/or surrogate as well as nursing, discussions with consultants, evaluation of patient's response to treatment, examination of patient, obtaining history from patient or surrogate, ordering and performing treatments and interventions, ordering and review of laboratory studies, ordering and review of radiographic studies, pulse oximetry and re-evaluation of patient's condition.  Medications Ordered in ED Medications  heparin bolus via infusion 3,000 Units (not administered)  heparin ADULT infusion 100 units/mL (25000 units/243mL sodium chloride 0.45%) (not administered)  nitroGLYCERIN 50 mg in dextrose 5 % 250 mL (0.2 mg/mL) infusion (not administered)  ondansetron (ZOFRAN) injection 4 mg (4 mg Intravenous Given 10/15/16 2332)  sodium chloride 0.9 % bolus 1,000 mL (0 mLs Intravenous Stopped 10/16/16 0140)  aspirin chewable tablet 324 mg (324 mg Oral Given 10/16/16 0137)  nitroGLYCERIN (NITROGLYN) 2 % ointment 1 inch (1 inch Topical Given 10/16/16 0137)     Initial Impression / Assessment and Plan / ED Course  I have reviewed the triage vital signs and the nursing notes.  Pertinent labs & imaging results that were available during my care of the patient were reviewed by me and considered in my medical decision making (see chart for details).  Clinical Course as of Oct 16 241  Sun Oct 16, 2016  0002 Discussed with cardiology fellow, Dr. Jeannine Boga while discussing an additional patient. He reviewed EKG. It does not meet STEMI criteria. He is aware of positive troponin. Request admission to hospitalist.  [CH]  0215 Patient feeling much better with nausea medication and nitroglycerin ointment. Feel that he likely has had an anginal equivalent. He continues to be chest pain free. Will start heparin and nitroglycerin IV as he is also hypertensive.  [CH]  [phone] Hospitalist  requesting formal cardiology evaluation. This was placed.  [CH]    Clinical Course User Index [CH] Tzivia Oneil, Barbette Hair, MD    Patient presents which overlies weakness 2 days and nausea. No abdominal pain, chest pain, shortness of breath. Ill-appearing but nontoxic. Hypertensive.  Initially was difficult obtaining history from as he was so uncomfortable. Physical exam is largely benign with the exception of discomfort and appearing ill. EKG initially with some ST segment changes in V2 and V3 but does not meet STEMI criteria and no reciprocal changes. Troponin noted to be 0.35.  She was placed on nitroglycerin ointment. Given aspirin. He continues  to be chest pain-free but nausea is a known anginal equivalent.  Discussed briefly with cardiology fellow. He has reviewed the EKG does not fill with meet STEMI criteria. Requesting admission to the hospitalist. Initial potassium elevated. Repeat potassium normal. On recheck, patient feels much better after nausea medication and nitroglycerin. Patient was started on heparin and IV nitroglycerin as he is persistently hypertensive and given that I have no other etiology for his symptoms, feel that he likely has had an NSTEMI in the last 2 days.  Discussed with the admitting hospitalist. He is concerned regarding EKG changes. He is requesting formal cardiology evaluation. I discussed with cardiology at fellow again who has requested that the hospitalist formally consult him. A 3rd EKG was obtained and shows no additional dynamic changes.  Final Clinical Impressions(s) / ED Diagnoses   Final diagnoses:  Nausea  Elevated troponin  NSTEMI (non-ST elevated myocardial infarction) [facility])    New Prescriptions New Prescriptions   No medications on file   I personally performed the services described in this documentation, which was scribed in my presence. The recorded information has been reviewed and is accurate.     Merryl Hacker, MD 10/16/16 [phone]

## 2016-10-16 ENCOUNTER — Inpatient Hospital Stay (HOSPITAL_COMMUNITY): Payer: Medicare Other

## 2016-10-16 ENCOUNTER — Encounter (HOSPITAL_COMMUNITY): Payer: Self-pay | Admitting: Internal Medicine

## 2016-10-16 ENCOUNTER — Emergency Department (HOSPITAL_COMMUNITY): Payer: Medicare Other

## 2016-10-16 DIAGNOSIS — I2510 Atherosclerotic heart disease of native coronary artery without angina pectoris: Secondary | ICD-10-CM | POA: Diagnosis not present

## 2016-10-16 DIAGNOSIS — R112 Nausea with vomiting, unspecified: Secondary | ICD-10-CM | POA: Diagnosis not present

## 2016-10-16 DIAGNOSIS — D62 Acute posthemorrhagic anemia: Secondary | ICD-10-CM | POA: Diagnosis not present

## 2016-10-16 DIAGNOSIS — N183 Chronic kidney disease, stage 3 unspecified: Secondary | ICD-10-CM | POA: Diagnosis present

## 2016-10-16 DIAGNOSIS — R7989 Other specified abnormal findings of blood chemistry: Secondary | ICD-10-CM

## 2016-10-16 DIAGNOSIS — R040 Epistaxis: Secondary | ICD-10-CM | POA: Diagnosis not present

## 2016-10-16 DIAGNOSIS — E1122 Type 2 diabetes mellitus with diabetic chronic kidney disease: Secondary | ICD-10-CM | POA: Diagnosis not present

## 2016-10-16 DIAGNOSIS — I340 Nonrheumatic mitral (valve) insufficiency: Secondary | ICD-10-CM

## 2016-10-16 DIAGNOSIS — E1165 Type 2 diabetes mellitus with hyperglycemia: Secondary | ICD-10-CM | POA: Diagnosis present

## 2016-10-16 DIAGNOSIS — R9439 Abnormal result of other cardiovascular function study: Secondary | ICD-10-CM | POA: Diagnosis not present

## 2016-10-16 DIAGNOSIS — R778 Other specified abnormalities of plasma proteins: Secondary | ICD-10-CM | POA: Diagnosis present

## 2016-10-16 DIAGNOSIS — N189 Chronic kidney disease, unspecified: Secondary | ICD-10-CM | POA: Diagnosis not present

## 2016-10-16 DIAGNOSIS — I214 Non-ST elevation (NSTEMI) myocardial infarction: Secondary | ICD-10-CM | POA: Diagnosis not present

## 2016-10-16 DIAGNOSIS — I161 Hypertensive emergency: Secondary | ICD-10-CM | POA: Diagnosis present

## 2016-10-16 DIAGNOSIS — I255 Ischemic cardiomyopathy: Secondary | ICD-10-CM | POA: Diagnosis present

## 2016-10-16 DIAGNOSIS — Z79899 Other long term (current) drug therapy: Secondary | ICD-10-CM | POA: Diagnosis not present

## 2016-10-16 DIAGNOSIS — J90 Pleural effusion, not elsewhere classified: Secondary | ICD-10-CM | POA: Diagnosis not present

## 2016-10-16 DIAGNOSIS — A084 Viral intestinal infection, unspecified: Secondary | ICD-10-CM | POA: Diagnosis present

## 2016-10-16 DIAGNOSIS — N184 Chronic kidney disease, stage 4 (severe): Secondary | ICD-10-CM | POA: Diagnosis not present

## 2016-10-16 DIAGNOSIS — I25110 Atherosclerotic heart disease of native coronary artery with unstable angina pectoris: Secondary | ICD-10-CM | POA: Diagnosis not present

## 2016-10-16 DIAGNOSIS — Z01810 Encounter for preprocedural cardiovascular examination: Secondary | ICD-10-CM | POA: Diagnosis not present

## 2016-10-16 DIAGNOSIS — E1121 Type 2 diabetes mellitus with diabetic nephropathy: Secondary | ICD-10-CM | POA: Diagnosis present

## 2016-10-16 DIAGNOSIS — I129 Hypertensive chronic kidney disease with stage 1 through stage 4 chronic kidney disease, or unspecified chronic kidney disease: Secondary | ICD-10-CM | POA: Diagnosis not present

## 2016-10-16 DIAGNOSIS — Z87891 Personal history of nicotine dependence: Secondary | ICD-10-CM | POA: Diagnosis not present

## 2016-10-16 DIAGNOSIS — R748 Abnormal levels of other serum enzymes: Secondary | ICD-10-CM | POA: Diagnosis not present

## 2016-10-16 DIAGNOSIS — N179 Acute kidney failure, unspecified: Secondary | ICD-10-CM | POA: Diagnosis not present

## 2016-10-16 DIAGNOSIS — R042 Hemoptysis: Secondary | ICD-10-CM | POA: Diagnosis not present

## 2016-10-16 DIAGNOSIS — I10 Essential (primary) hypertension: Secondary | ICD-10-CM

## 2016-10-16 DIAGNOSIS — Z833 Family history of diabetes mellitus: Secondary | ICD-10-CM | POA: Diagnosis not present

## 2016-10-16 DIAGNOSIS — Z794 Long term (current) use of insulin: Secondary | ICD-10-CM | POA: Diagnosis not present

## 2016-10-16 DIAGNOSIS — E119 Type 2 diabetes mellitus without complications: Secondary | ICD-10-CM

## 2016-10-16 DIAGNOSIS — Z8249 Family history of ischemic heart disease and other diseases of the circulatory system: Secondary | ICD-10-CM | POA: Diagnosis not present

## 2016-10-16 DIAGNOSIS — J449 Chronic obstructive pulmonary disease, unspecified: Secondary | ICD-10-CM | POA: Diagnosis present

## 2016-10-16 DIAGNOSIS — D631 Anemia in chronic kidney disease: Secondary | ICD-10-CM | POA: Diagnosis present

## 2016-10-16 DIAGNOSIS — E785 Hyperlipidemia, unspecified: Secondary | ICD-10-CM | POA: Diagnosis not present

## 2016-10-16 DIAGNOSIS — N2581 Secondary hyperparathyroidism of renal origin: Secondary | ICD-10-CM | POA: Diagnosis not present

## 2016-10-16 DIAGNOSIS — R110 Nausea: Secondary | ICD-10-CM | POA: Diagnosis present

## 2016-10-16 DIAGNOSIS — Z823 Family history of stroke: Secondary | ICD-10-CM | POA: Diagnosis not present

## 2016-10-16 LAB — URINALYSIS, ROUTINE W REFLEX MICROSCOPIC
Bilirubin Urine: NEGATIVE
Glucose, UA: >=500 mg/dL — AB
Ketones, ur: 5 mg/dL — AB
Leukocytes, UA: NEGATIVE
Nitrite: NEGATIVE
Protein, ur: >=300 mg/dL — AB
Specific Gravity, Urine: 1.01 (ref 1.005–1.030)
Squamous Epithelial / HPF: NONE SEEN
pH: 8 (ref 5.0–8.0)

## 2016-10-16 LAB — COMPREHENSIVE METABOLIC PANEL
ALK PHOS: 62 U/L (ref 38–126)
ALT: 27 U/L (ref 17–63)
ALT: 20 U/L (ref 17–63)
ANION GAP: 10 (ref 5–15)
AST: 48 U/L — ABNORMAL HIGH (ref 15–41)
AST: 31 U/L (ref 15–41)
Albumin: 3.7 g/dL (ref 3.5–5.0)
Albumin: 3.2 g/dL — ABNORMAL LOW (ref 3.5–5.0)
Alkaline Phosphatase: 59 U/L (ref 38–126)
Anion gap: 9 (ref 5–15)
BUN: 22 mg/dL — ABNORMAL HIGH (ref 6–20)
BUN: 19 mg/dL (ref 6–20)
CALCIUM: 8.8 mg/dL — AB (ref 8.9–10.3)
CHLORIDE: 103 mmol/L (ref 101–111)
CO2: 23 mmol/L (ref 22–32)
CO2: 21 mmol/L — AB (ref 22–32)
CREATININE: 1.45 mg/dL — AB (ref 0.61–1.24)
CREATININE: 1.31 mg/dL — AB (ref 0.61–1.24)
Calcium: 9.4 mg/dL (ref 8.9–10.3)
Chloride: 104 mmol/L (ref 101–111)
GFR calc non Af Amer: 55 mL/min — ABNORMAL LOW (ref >60)
GFR, EST AFRICAN AMERICAN: 56 mL/min — AB (ref >60)
GFR, EST NON AFRICAN AMERICAN: 48 mL/min — AB (ref >60)
Glucose, Bld: 228 mg/dL — ABNORMAL HIGH (ref 65–99)
Glucose, Bld: 257 mg/dL — ABNORMAL HIGH (ref 65–99)
Potassium: 5.8 mmol/L — ABNORMAL HIGH (ref 3.5–5.1)
Potassium: 4.7 mmol/L (ref 3.5–5.1)
SODIUM: 136 mmol/L (ref 135–145)
SODIUM: 134 mmol/L — AB (ref 135–145)
Total Bilirubin: 1.4 mg/dL — ABNORMAL HIGH (ref 0.3–1.2)
Total Bilirubin: 0.7 mg/dL (ref 0.3–1.2)
Total Protein: 7.2 g/dL (ref 6.5–8.1)
Total Protein: 6.2 g/dL — ABNORMAL LOW (ref 6.5–8.1)

## 2016-10-16 LAB — ECHOCARDIOGRAM COMPLETE
Height: 70 in
WEIGHTICAEL: 2306.89 [oz_av]

## 2016-10-16 LAB — TROPONIN I
Troponin I: 0.69 ng/mL
Troponin I: 0.51 ng/mL (ref <0.03)
Troponin I: 0.55 ng/mL (ref <0.03)
Troponin I: 0.59 ng/mL (ref <0.03)

## 2016-10-16 LAB — LIPID PANEL
Cholesterol: 158 mg/dL (ref 0–200)
HDL: 48 mg/dL (ref >40)
LDL Cholesterol: 100 mg/dL — ABNORMAL HIGH (ref 0–99)
Total CHOL/HDL Ratio: 3.3 RATIO
Triglycerides: 49 mg/dL (ref <150)
VLDL: 10 mg/dL (ref 0–40)

## 2016-10-16 LAB — GLUCOSE, CAPILLARY
GLUCOSE-CAPILLARY: 269 mg/dL — AB (ref 65–99)
GLUCOSE-CAPILLARY: 129 mg/dL — AB (ref 65–99)
Glucose-Capillary: 261 mg/dL — ABNORMAL HIGH (ref 65–99)
Glucose-Capillary: 109 mg/dL — ABNORMAL HIGH (ref 65–99)

## 2016-10-16 LAB — HEPARIN LEVEL (UNFRACTIONATED): Heparin Unfractionated: 0.58 [IU]/mL (ref 0.30–0.70)

## 2016-10-16 LAB — RAPID URINE DRUG SCREEN, HOSP PERFORMED
Amphetamines: NOT DETECTED
BENZODIAZEPINES: NOT DETECTED
Barbiturates: NOT DETECTED
COCAINE: NOT DETECTED
OPIATES: NOT DETECTED
TETRAHYDROCANNABINOL: NOT DETECTED

## 2016-10-16 LAB — MRSA PCR SCREENING: MRSA BY PCR: NEGATIVE

## 2016-10-16 LAB — POTASSIUM: Potassium: 4.5 mmol/L (ref 3.5–5.1)

## 2016-10-16 LAB — LIPASE, BLOOD: Lipase: 18 U/L (ref 11–51)

## 2016-10-16 MED ORDER — HEPARIN (PORCINE) IN NACL 100-0.45 UNIT/ML-% IJ SOLN
750.0000 [IU]/h | INTRAMUSCULAR | Status: DC
  Administered 2016-10-16: 750 [IU]/h via INTRAVENOUS
  Filled 2016-10-16: qty 250

## 2016-10-16 MED ORDER — PANTOPRAZOLE SODIUM 40 MG PO TBEC
40.0000 mg | DELAYED_RELEASE_TABLET | Freq: Every day | ORAL | Status: DC
  Filled 2016-10-16: qty 1

## 2016-10-16 MED ORDER — NITROGLYCERIN 2 % TD OINT
1.0000 [in_us] | TOPICAL_OINTMENT | Freq: Once | TRANSDERMAL | Status: AC
  Administered 2016-10-16: 1 [in_us] via TOPICAL
  Filled 2016-10-16: qty 1

## 2016-10-16 MED ORDER — SODIUM CHLORIDE 0.9 % IV SOLN
INTRAVENOUS | Status: DC
  Administered 2016-10-16 – 2016-10-17 (×2): via INTRAVENOUS

## 2016-10-16 MED ORDER — MORPHINE SULFATE (PF) 4 MG/ML IV SOLN
2.0000 mg | INTRAVENOUS | Status: DC | PRN
  Administered 2016-10-16: 2 mg via INTRAVENOUS
  Filled 2016-10-16: qty 1

## 2016-10-16 MED ORDER — ONDANSETRON HCL 4 MG/2ML IJ SOLN
4.0000 mg | Freq: Four times a day (QID) | INTRAMUSCULAR | Status: DC | PRN
  Administered 2016-10-16 – 2016-10-19 (×7): 4 mg via INTRAVENOUS
  Filled 2016-10-16 (×8): qty 2

## 2016-10-16 MED ORDER — ZOLPIDEM TARTRATE 5 MG PO TABS
5.0000 mg | ORAL_TABLET | Freq: Every evening | ORAL | Status: DC | PRN
  Administered 2016-10-25: 5 mg via ORAL
  Filled 2016-10-16: qty 1

## 2016-10-16 MED ORDER — ASPIRIN 81 MG PO CHEW
81.0000 mg | CHEWABLE_TABLET | Freq: Every day | ORAL | Status: DC
  Administered 2016-10-16 – 2016-10-25 (×10): 81 mg via ORAL
  Filled 2016-10-16 (×10): qty 1

## 2016-10-16 MED ORDER — ONDANSETRON HCL 4 MG/2ML IJ SOLN
4.0000 mg | Freq: Three times a day (TID) | INTRAMUSCULAR | Status: AC | PRN
  Administered 2016-10-16: 4 mg via INTRAVENOUS
  Filled 2016-10-16: qty 2

## 2016-10-16 MED ORDER — NITROGLYCERIN IN D5W 200-5 MCG/ML-% IV SOLN
0.0000 ug/min | Freq: Once | INTRAVENOUS | Status: DC
  Administered 2016-10-16: 40 ug/min via INTRAVENOUS
  Filled 2016-10-16: qty 250

## 2016-10-16 MED ORDER — LISINOPRIL 20 MG PO TABS
20.0000 mg | ORAL_TABLET | Freq: Every day | ORAL | Status: DC
  Administered 2016-10-16: 20 mg via ORAL
  Filled 2016-10-16: qty 1

## 2016-10-16 MED ORDER — PROMETHAZINE HCL 25 MG/ML IJ SOLN
12.5000 mg | Freq: Four times a day (QID) | INTRAMUSCULAR | Status: DC | PRN
  Administered 2016-10-17 – 2016-10-18 (×3): 12.5 mg via INTRAVENOUS
  Filled 2016-10-16 (×3): qty 1

## 2016-10-16 MED ORDER — ASPIRIN 81 MG PO CHEW: 324.0000 mg | CHEWABLE_TABLET | Freq: Once | ORAL | Status: DC

## 2016-10-16 MED ORDER — ATORVASTATIN CALCIUM 80 MG PO TABS
80.0000 mg | ORAL_TABLET | Freq: Every day | ORAL | Status: DC
  Filled 2016-10-16: qty 1

## 2016-10-16 MED ORDER — ASPIRIN 81 MG PO CHEW
324.0000 mg | CHEWABLE_TABLET | Freq: Once | ORAL | Status: AC
  Administered 2016-10-16: 324 mg via ORAL
  Filled 2016-10-16: qty 4

## 2016-10-16 MED ORDER — ACETAMINOPHEN 325 MG PO TABS: 650.0000 mg | ORAL_TABLET | Freq: Four times a day (QID) | ORAL | Status: DC | PRN

## 2016-10-16 MED ORDER — NITROGLYCERIN IN D5W 200-5 MCG/ML-% IV SOLN
0.0000 ug/min | INTRAVENOUS | Status: DC
  Administered 2016-10-16 (×2): 60 ug/min via INTRAVENOUS
  Administered 2016-10-17: 35 ug/min via INTRAVENOUS
  Filled 2016-10-16 (×2): qty 250

## 2016-10-16 MED ORDER — INSULIN ASPART 100 UNIT/ML ~~LOC~~ SOLN
0.0000 [IU] | Freq: Every day | SUBCUTANEOUS | Status: DC
  Administered 2016-10-22 – 2016-10-25 (×3): 2 [IU] via SUBCUTANEOUS

## 2016-10-16 MED ORDER — ALBUTEROL SULFATE (2.5 MG/3ML) 0.083% IN NEBU: 2.5000 mg | INHALATION_SOLUTION | RESPIRATORY_TRACT | Status: DC | PRN

## 2016-10-16 MED ORDER — AMLODIPINE BESYLATE 10 MG PO TABS
10.0000 mg | ORAL_TABLET | Freq: Every day | ORAL | Status: DC
  Administered 2016-10-16 – 2016-10-23 (×8): 10 mg via ORAL
  Filled 2016-10-16 (×8): qty 1

## 2016-10-16 MED ORDER — MORPHINE SULFATE (PF) 2 MG/ML IV SOLN
2.0000 mg | Freq: Once | INTRAVENOUS | Status: AC
  Administered 2016-10-16: 2 mg via INTRAVENOUS
  Filled 2016-10-16: qty 1

## 2016-10-16 MED ORDER — INSULIN ASPART 100 UNIT/ML ~~LOC~~ SOLN
0.0000 [IU] | Freq: Three times a day (TID) | SUBCUTANEOUS | Status: DC
  Administered 2016-10-16 (×2): 5 [IU] via SUBCUTANEOUS
  Administered 2016-10-17: 3 [IU] via SUBCUTANEOUS
  Administered 2016-10-17 – 2016-10-19 (×4): 2 [IU] via SUBCUTANEOUS
  Administered 2016-10-19: 1 [IU] via SUBCUTANEOUS
  Administered 2016-10-19 – 2016-10-20 (×3): 2 [IU] via SUBCUTANEOUS
  Administered 2016-10-20: 3 [IU] via SUBCUTANEOUS
  Administered 2016-10-21 – 2016-10-23 (×7): 2 [IU] via SUBCUTANEOUS
  Administered 2016-10-23 (×2): 3 [IU] via SUBCUTANEOUS
  Administered 2016-10-24 (×2): 2 [IU] via SUBCUTANEOUS
  Administered 2016-10-24: 5 [IU] via SUBCUTANEOUS
  Administered 2016-10-25: 3 [IU] via SUBCUTANEOUS
  Administered 2016-10-25 – 2016-10-26 (×2): 5 [IU] via SUBCUTANEOUS

## 2016-10-16 MED ORDER — MORPHINE SULFATE (PF) 4 MG/ML IV SOLN
4.0000 mg | INTRAVENOUS | Status: DC | PRN
  Administered 2016-10-16 – 2016-10-18 (×7): 4 mg via INTRAVENOUS
  Filled 2016-10-16 (×7): qty 1

## 2016-10-16 MED ORDER — HEPARIN BOLUS VIA INFUSION
3000.0000 [IU] | Freq: Once | INTRAVENOUS | Status: AC
  Administered 2016-10-16: 3000 [IU] via INTRAVENOUS
  Filled 2016-10-16: qty 3000

## 2016-10-16 MED ORDER — CARVEDILOL 3.125 MG PO TABS
3.1250 mg | ORAL_TABLET | Freq: Two times a day (BID) | ORAL | Status: DC
  Administered 2016-10-16 – 2016-10-17 (×3): 3.125 mg via ORAL
  Filled 2016-10-16 (×3): qty 1

## 2016-10-16 NOTE — Progress Notes (Signed)
   Progress Note  Patient Name: Shawn Collins Date of Encounter: 10/16/2016  Reviewed cardiology fellow consultation from overnight. We were asked to see patient secondary to mild troponin I elevation, although no chest pain reported, patient was having malaise, fatigue, and nausea with intermittent emesis. Troponin I trend is relatively flat at 0.69, 0.51, and 0.55 not overly suggestive of ACS. This is also noted in the setting of hypertensive urgency. ECG showed anterior ST-T wave abnormalities that although nonspecific still raise the possibility of ischemic change. Current medical regimen includes aspirin, Coreg, Norvasc, and nitroglycerin. Patient is undergoing further workup including CT of the abdomen and pelvis in light of his nausea and emesis. Would recommend an echocardiogram to assess cardiac structure and function as well as a follow-up ECG. We will continue to follow with you as the situation evolves to determine whether further ischemic evaluation is needed.  Signed, Rozann Lesches, MD  10/16/2016, 11:45 AM

## 2016-10-16 NOTE — Progress Notes (Signed)
ANTICOAGULATION CONSULT NOTE - Initial Consult  Pharmacy Consult for heparin Indication: chest pain/ACS  No Known Allergies  Patient Measurements: Height: 5\' 10"  (177.8 cm) Weight: 140 lb (63.5 kg) IBW/kg (Calculated) : 73  Vital Signs: Temp: 97.6 F (36.4 C) (05/19 2326) Temp Source: Oral (05/19 2326) BP: 187/95 (05/20 0200) Pulse Rate: 72 (05/20 0200)  Labs:  Recent Labs  10/15/16 2329  HGB 13.3  HCT 39.3  PLT 268  CREATININE 1.45*    Estimated Creatinine Clearance: 44.4 mL/min (A) (by C-G formula based on SCr of 1.45 mg/dL (H)).   Medical History: Past Medical History:  Diagnosis Date  . Diabetes mellitus without complication ([place])   . Emphysema lung ([place])   . Hyperlipidemia   . Hypertension     Assessment: 66yo male c/o N/V/weakness x2d, istat troponin elevated, to begin heparin.  Goal of Therapy:  Heparin level 0.3-0.7 units/ml Monitor platelets by anticoagulation protocol: Yes   Plan:  Will give heparin 3000 units IV bolus x1 followed by gtt at 750 units/hr and monitor heparin levels and CBC.  Wynona Neat, PharmD, BCPS  10/16/2016,2:18 AM

## 2016-10-16 NOTE — Consult Note (Signed)
CARDIOLOGY CONSULT NOTE   Patient ID: Shawn Collins MRN: [id number] DOB/AGE: 07/19/1950 66 y.o.  Admit date: 10/15/2016  Requesting Physician: Primary Physician:   Nolene Ebbs, MD Primary Cardiologist:   N/A Reason for Consultation:  Elevated troponin   HPI: Shawn Collins is a 66 y.o. male with PMH significant for HTN, NIDDM, dyslipidemia, and smoking who presented to the ED with two days of malaise, fatigue, and persistent nausea with bouts of emesis.  On arrival here he was noted to hypertensive with SBP>289mmHg but was otherwise hemodynamically stable.  Initial workup included an ECG was concerning for ischemic changes and a mildly elevated troponin prompting cardiology consultant.  [name] is currently laying in bed and c/o persistent nausea.  He denies chest pain, either currently or over recent days, and also denies SOB or DOE.  He does not currently endorse abdominal discomfort beyond his nausea, and specifically denies epigastric pain.  He has been able to take his prescribed antihypertensive meds over recent days due to his nausea.   Past Medical History:  Diagnosis Date  . Diabetes mellitus without complication ([place])   . Emphysema lung ([place])   . Hyperlipidemia   . Hypertension      History reviewed. No pertinent surgical history.  No Known Allergies  I have reviewed the patient's current medications . amLODipine  10 mg Oral Daily  . aspirin  324 mg Oral Once  . atorvastatin  80 mg Oral q1800  . carvedilol  3.125 mg Oral BID WC  . pantoprazole  40 mg Oral Q1200   . heparin 750 Units/hr (10/16/16 0248)  . nitroGLYCERIN     acetaminophen, morphine injection, ondansetron (ZOFRAN) IV, zolpidem  Prior to Admission medications   Medication Sig Start Date End Date Taking? Authorizing Provider  glimepiride (AMARYL) 4 MG tablet Take 4 mg by mouth daily with breakfast.  09/07/14  Yes [provider]  lisinopril (PRINIVIL,ZESTRIL) 20 MG tablet Take 20  mg by mouth daily.   Yes [provider]  amLODipine (NORVASC) 10 MG tablet Take 1 tablet (10 mg total) by mouth daily. Patient not taking: Reported on 10/16/2016 09/12/14   Reyne Dumas, MD  diphenhydrAMINE (BENADRYL) 25 MG tablet Take 1 tablet (25 mg total) by mouth every 6 (six) hours. Patient not taking: Reported on 09/10/2014 03/07/13   Hazel Sams, PA-C  hydrocortisone cream 1 % Apply to affected area 2 times daily Patient not taking: Reported on 09/10/2014 03/07/13   Hazel Sams, PA-C  promethazine (PHENERGAN) 12.5 MG tablet Take 1 tablet (12.5 mg total) by mouth every 6 (six) hours as needed for nausea. Patient not taking: Reported on 10/16/2016 09/12/14   Reyne Dumas, MD     Social History   Social History  . Marital status: Single    Spouse name: N/A  . Number of children: N/A  . Years of education: N/A   Occupational History  . Not on file.   Social History Main Topics  . Smoking status: Former Research scientist (life sciences)  . Smokeless tobacco: Not on file  . Alcohol use No  . Drug use: No  . Sexual activity: Not on file   Other Topics Concern  . Not on file   Social History Narrative  . No narrative on file    Family Status  Relation Status  . Mother (Not Specified)  . Father (Not Specified)  . Sister (Not Specified)   Family History  Problem Relation Age of Onset  . Diabetes Mellitus  II Mother   . Stroke Father   . Hypertension Father   . Diabetes Mellitus II Sister     ROS:  Full 14 point review of systems complete and found to be negative unless listed above.  Physical Exam: Blood pressure (!) 182/96, pulse 75, temperature 97.6 F (36.4 C), temperature source Oral, resp. rate 14, height 5\' 10"  (1.778 m), weight 63.5 kg (140 lb), SpO2 98 %.  General: mild distress 2/2 nausea, oriented x 3 Head: Eyes PERRLA, No xanthomas.   Normocephalic and atraumatic, oropharynx without edema or exudate. Dentition:  Lungs: CTAB, no w/r/c Heart: RRR, +S1 +S2, no appreciable  m/r/g, no JVD, no LE edema   Neck: No carotid bruits. No lymphadenopathy.  JVD. Abdomen: Bowel sounds present, abdomen soft and non-tender without masses or hernias noted.  Msk:  No spine or cva tenderness. No weakness, no joint deformities or effusions. Extremities: No clubbing or cyanosis.  edema.  Neuro: Alert and oriented X 3. No focal deficits noted. Psych:  Good affect, responds appropriately Skin: No rashes or lesions noted.  Labs:   Lab Results  Component Value Date   WBC 6.9 10/15/2016   HGB 13.3 10/15/2016   HCT 39.3 10/15/2016   MCV 87.5 10/15/2016   PLT 268 10/15/2016   No results for input(s): INR in the last 72 hours.  Recent Labs Lab 10/15/16 2329 10/16/16 0103  NA 136  --   K 5.8* 4.5  CL 104  --   CO2 23  --   BUN 22*  --   CREATININE 1.45*  --   CALCIUM 9.4  --   PROT 7.2  --   BILITOT 1.4*  --   ALKPHOS 62  --   ALT 27  --   AST 48*  --   GLUCOSE 228*  --   ALBUMIN 3.7  --    Magnesium  Date Value Ref Range Status  09/12/2014 1.9 1.5 - 2.5 mg/dL Final   No results for input(s): CKTOTAL, CKMB, TROPONINI in the last 72 hours.  Recent Labs  10/15/16 2343  TROPIPOC 0.35*   No results found for: PROBNP No results found for: CHOL, HDL, LDLCALC, TRIG No results found for: DDIMER Lipase  Date/Time Value Ref Range Status  10/15/2016 11:29 PM 18 11 - 51 U/L Final    Echo: pending  ECG: per my review, initial tracing this evening shows NSR with normal axis and normal intervals with J-point elevation in V2 and V3.  Subsequent tracings also show NSR with similar J-point elevation and TWI in V2 and V3.  Radiology:  Dg Chest Portable 1 View  Result Date: 10/16/2016 CLINICAL DATA:  Weakness, nausea and vomiting x2 days EXAM: PORTABLE CHEST 1 VIEW COMPARISON:  09/11/2014 CXR FINDINGS: The heart size and mediastinal contours are within normal limits. Both lungs are clear. The visualized skeletal structures are unremarkable. IMPRESSION: No active  disease. Electronically Signed   By: Ashley Royalty M.D.   On: 10/16/2016 00:45    ASSESSMENT AND PLAN:   Shawn Collins is a 66 y.o. male with PMH significant for HTN, NIDDM, dyslipidemia, and smoking who presented to the ED with two days of malaise, fatigue, and persistent nausea with bouts of emesis.   His ECG does not meet STEMI criteria but could represent subendocardial ischemia.  That being said he has been CP free while his symptoms have been present for more than 48H, and so I favor a primary GI illness in combination with hypertensive  urgency resulting in demand ischemia as the etiology of these changes and the abnormal troponin, while an acute plaque rupture event seems less likely at this point given the time course of his symptoms. I discussed the case directly with the hospitalist admitting the pt, Dr. Blaine Hamper, and given his concern for a true plaque rupture event a heparin gtt had already been started and so will be continued.  # Elevated troponin; pt has been CP free, favor demand ischemia as the etiology of his troponin elevation and ECG changes rather than true plaque rupture event, but clearly given HTN/dyslipidemia/DM/smoking he has risk factors for a true ACS event as well.  While UA has both significant glucosuria and ketonuria, no anion gap to suggest DKA. - trend troponin until peak - management of hypertensive urgency  - repeat ECG in the morning, and if CP develops - ASA 324mg  PO x 1 then 81mg  PO QDAY - defer P2Y12 inhibitor - agree with carvedilol - heparin gtt  - defer statin therapy given mildly abnormal LFTs - NPO for possible cath should cardiac enzymes quickly uptrend or clinical situation otherwise change to indicate a need for invasive ischemic evaluation - A1C and fasting lipid panel to complete risk profile  # HTN; concern for hypertensive emergency given elevated troponin and AKI - management per hospitalist, pt already started on nitro gtt and will also receive  carvedilol - would suggest restarting home amlodipine as well  # AKI; management per primary team     Principal Problem:   NSTEMI (non-ST elevated myocardial infarction) ([place]) Active Problems:   COPD (chronic obstructive pulmonary disease) ([place])   Diabetes mellitus without complication ([place])   Hypertension   Nausea & vomiting   CKD (chronic kidney disease), stage III   Signed: Clayborne Dana, MD 10/16/2016 3:15 AM

## 2016-10-16 NOTE — Progress Notes (Signed)
Pt seen and examined, admitted this am 65/M with DM, HTN, DYslipidemia admitted with N/V and Abd discomfort, CT abd pelvis unremarkable Labs with elevated troponin, mild AKI and normal AST/ALT, lipase, WBC Cards consulting, trend troponins, FU ECHO N/V/abd pain unclear if related to ACS, CT abd pelvis without dissection -supportive care, IVF, bowel rest -further management as condition resolves  Domenic Polite, MD

## 2016-10-16 NOTE — H&P (Signed)
History and Physical    Shawn Collins CLE:[id number] DOB: November 11, 1950 DOA: 10/15/2016  Referring MD/NP/PA:   PCP: Nolene Ebbs, MD   Patient coming from:  The patient is coming from home.  At baseline, pt is independent for most of ADL.   Chief Complaint: Nausea, vomiting, abdominal discomfort  HPI: Shawn Collins is a 66 y.o. male with medical history significant of hypertension, hyperlipidemia, diabetes mellitus, COPD, chronic kidney disease-stage III, who presents with nausea, vomiting and abdominal discomfort.  Patient states that he has been having nausea, vomiting and abdominal discomfort for almost 2 days. He vomited 4 times without blood in the vomitus today. He denies abdominal pain, but states that his abdominal is very discomfort in the epigastric area. No diarrhea. Denies chest pain or SOB. No fever or chills. Patient does not have symptoms of UTI or unilateral weakness. No calf tenderness.  ED Course: pt was found to have positive troponin 0.35, WBC 6.9, lipase 18, stable renal function, potassium was 5.8 on initial BMP, but became K= 4.4 on repeated potassium level, temperature normal, no tachycardia, O2 sat 92-98% on room air, elevated blood pressure 214/97-->187/95. Chest x-rays negative for abnormalities. Patient is admitted to stepdown as inpatient.  Review of Systems:   General: no fevers, chills, no changes in body weight, has poor appetite, has fatigue HEENT: no blurry vision, hearing changes or sore throat Respiratory: no dyspnea, coughing, wheezing CV: no chest pain, no palpitations GI: has nausea, vomiting and epigastric discomfort. No diarrhea, constipation GU: no dysuria, burning on urination, increased urinary frequency, hematuria  Ext: no leg edema Neuro: no unilateral weakness, numbness, or tingling, no vision change or hearing loss Skin: no rash, no skin tear. MSK: No muscle spasm, no deformity, no limitation of range of movement in spin Heme: No easy  bruising.  Travel history: No recent long distant travel.  Allergy: No Known Allergies  Past Medical History:  Diagnosis Date  . Diabetes mellitus without complication ([place])   . Emphysema lung ([place])   . Hyperlipidemia   . Hypertension     History reviewed. No pertinent surgical history.  Social History:  reports that he has quit smoking. He does not have any smokeless tobacco history on file. He reports that he does not drink alcohol or use drugs.  Family History:  Family History  Problem Relation Age of Onset  . Diabetes Mellitus II Mother   . Stroke Father   . Hypertension Father   . Diabetes Mellitus II Sister      Prior to Admission medications   Medication Sig Start Date End Date Taking? Authorizing Provider  glimepiride (AMARYL) 4 MG tablet Take 4 mg by mouth daily with breakfast.  09/07/14  Yes [provider]  lisinopril (PRINIVIL,ZESTRIL) 20 MG tablet Take 20 mg by mouth daily.   Yes [provider]  amLODipine (NORVASC) 10 MG tablet Take 1 tablet (10 mg total) by mouth daily. Patient not taking: Reported on 10/16/2016 09/12/14   Reyne Dumas, MD  diphenhydrAMINE (BENADRYL) 25 MG tablet Take 1 tablet (25 mg total) by mouth every 6 (six) hours. Patient not taking: Reported on 09/10/2014 03/07/13   Hazel Sams, PA-C  hydrocortisone cream 1 % Apply to affected area 2 times daily Patient not taking: Reported on 09/10/2014 03/07/13   Hazel Sams, PA-C  promethazine (PHENERGAN) 12.5 MG tablet Take 1 tablet (12.5 mg total) by mouth every 6 (six) hours as needed for nausea. Patient not taking: Reported on 10/16/2016 09/12/14   Abrol,  Ascencion Dike, MD    Physical Exam: Vitals:   10/16/16 0200 10/16/16 0230 10/16/16 0245 10/16/16 0300  BP: (!) 187/95 (!) 191/98 (!) 196/110 (!) 182/96  Pulse: 72 74 71 75  Resp: (!) 0 13 17 14   Temp:      TempSrc:      SpO2: 98% 99% 98% 98%  Weight:      Height:       General: Not in acute distress HEENT:       Eyes: PERRL,  EOMI, no scleral icterus.       ENT: No discharge from the ears and nose, no pharynx injection, no tonsillar enlargement.        Neck: No JVD, no bruit, no mass felt. Heme: No neck lymph node enlargement. Cardiac: S1/S2, RRR, No murmurs, No gallops or rubs. Respiratory:  No rales, wheezing, rhonchi or rubs. GI: Soft, nondistended, mild epigastric tenderness, no rebound pain, no organomegaly, BS present. GU: No hematuria Ext: No pitting leg edema bilaterally. 2+DP/PT pulse bilaterally. Musculoskeletal: No joint deformities, No joint redness or warmth, no limitation of ROM in spin. Skin: No rashes.  Neuro: Alert, oriented X3, cranial nerves II-XII grossly intact, moves all extremities normally.  Psych: Patient is not psychotic, no suicidal or hemocidal ideation.  Labs on Admission: I have personally reviewed following labs and imaging studies  CBC:  Recent Labs Lab 10/15/16 2329  WBC 6.9  HGB 13.3  HCT 39.3  MCV 87.5  PLT 115   Basic Metabolic Panel:  Recent Labs Lab 10/15/16 2329 10/16/16 0103  NA 136  --   K 5.8* 4.5  CL 104  --   CO2 23  --   GLUCOSE 228*  --   BUN 22*  --   CREATININE 1.45*  --   CALCIUM 9.4  --    GFR: Estimated Creatinine Clearance: 44.4 mL/min (A) (by C-G formula based on SCr of 1.45 mg/dL (H)). Liver Function Tests:  Recent Labs Lab 10/15/16 2329  AST 48*  ALT 27  ALKPHOS 62  BILITOT 1.4*  PROT 7.2  ALBUMIN 3.7    Recent Labs Lab 10/15/16 2329  LIPASE 18   No results for input(s): AMMONIA in the last 168 hours. Coagulation Profile: No results for input(s): INR, PROTIME in the last 168 hours. Cardiac Enzymes: No results for input(s): CKTOTAL, CKMB, CKMBINDEX, TROPONINI in the last 168 hours. BNP (last 3 results) No results for input(s): PROBNP in the last 8760 hours. HbA1C: No results for input(s): HGBA1C in the last 72 hours. CBG:  Recent Labs Lab 10/15/16 2329  GLUCAP 234*   Lipid Profile: No results for input(s):  CHOL, HDL, LDLCALC, TRIG, CHOLHDL, LDLDIRECT in the last 72 hours. Thyroid Function Tests: No results for input(s): TSH, T4TOTAL, FREET4, T3FREE, THYROIDAB in the last 72 hours. Anemia Panel: No results for input(s): VITAMINB12, FOLATE, FERRITIN, TIBC, IRON, RETICCTPCT in the last 72 hours. Urine analysis:    Component Value Date/Time   COLORURINE STRAW (A) 10/15/2016 2329   APPEARANCEUR CLEAR 10/15/2016 2329   LABSPEC 1.010 10/15/2016 2329   PHURINE 8.0 10/15/2016 2329   GLUCOSEU >=500 (A) 10/15/2016 2329   HGBUR SMALL (A) 10/15/2016 2329   BILIRUBINUR NEGATIVE 10/15/2016 2329   KETONESUR 5 (A) 10/15/2016 2329   PROTEINUR >=300 (A) 10/15/2016 2329   UROBILINOGEN 1.0 06/27/2010 0631   NITRITE NEGATIVE 10/15/2016 2329   LEUKOCYTESUR NEGATIVE 10/15/2016 2329   Sepsis Labs: @LABRCNTIP (procalcitonin:4,lacticidven:4) )No results found for this or any previous visit (from the  past 240 hour(s)).   Radiological Exams on Admission: Dg Chest Portable 1 View  Result Date: 10/16/2016 CLINICAL DATA:  Weakness, nausea and vomiting x2 days EXAM: PORTABLE CHEST 1 VIEW COMPARISON:  09/11/2014 CXR FINDINGS: The heart size and mediastinal contours are within normal limits. Both lungs are clear. The visualized skeletal structures are unremarkable. IMPRESSION: No active disease. Electronically Signed   By: Ashley Royalty M.D.   On: 10/16/2016 00:45     EKG: Independently reviewed. Sinus rhythm, QTC 459, questionable ST elevation in V2-V4 (cardiologist, Dr. Jeannine Boga was consulted, who thinks it is less likely STMEI), T-wave inversion in lead 1/aVL.Marland Kitchen   Assessment/Plan Principal Problem:   NSTEMI (non-ST elevated myocardial infarction) ([place]) Active Problems:   COPD (chronic obstructive pulmonary disease) ([facility])   Diabetes mellitus without complication ([facility])   Hypertension   Nausea & vomiting   CKD (chronic kidney disease), stage III   Hypertensive emergency   Elevated trop and NSTEMI (non-ST  elevated myocardial infarction) [facility]): Patient has troponin 0.35-->0.69. His EKG is concerning for ST elevation in V2 to V4. Cardiology was consulted, Dr. Jeannine Boga evaluated pt. Per Dr. Jeannine Boga, "His ECG does not meet STEMI criteria, but could represent subendocardial ischemia". Pt has hypertensive emergency on admission, he likely has demand ischemia. Pt denies chest pain, but has epigastric discomfort, not sure this is an atypical presenation.  - will admit to SDU as inpt - Highly appreciate Dr. Aldona Lento recommendations - start IV heparin and iv Nitro gtt - cycle CE q6 x3 and repeat EKG in the am  - prn Morphine, aspirin, Coreg - will not start PPI due to AST 48 - Risk factor stratification: will check FLP and A1C  - 2d echo  Nausea & vomiting and epigastric discomfort: Etiology is not clear. May be due to viral gastroenteritis. Lipase normal. 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 Patient is taking amyrol at home -SSI -Check A1c  Hypertension and Hypertensive emergency: blood pressure 214/97 on admission\ -On IV nitro gtt -continue home amlodipine and lisinopril  CKD (chronic kidney disease), stage III: stable. Baseline creatinine 1.33-1.66. His creatinine is 1.45, BUN 22. -f/u renal function by BMP   DVT ppx: On IV Heparin   Code Status: Full code Family Communication: None at bed side. Disposition Plan:  Anticipate discharge back to previous home environment Consults called:  Card, Dr. Cheri Fowler Admission status: SDU/inpation       Date of Service 10/16/2016    Ivor Costa [facility] Pager [phone]  If 7PM-7AM, please contact night-coverage [email or web address] Password [email or web address] 10/16/2016, 3:17 AM

## 2016-10-17 DIAGNOSIS — R748 Abnormal levels of other serum enzymes: Secondary | ICD-10-CM

## 2016-10-17 DIAGNOSIS — I161 Hypertensive emergency: Secondary | ICD-10-CM

## 2016-10-17 LAB — CBC
HEMATOCRIT: 34 % — AB (ref 39.0–52.0)
Hemoglobin: 11.3 g/dL — ABNORMAL LOW (ref 13.0–17.0)
MCH: 29.2 pg (ref 26.0–34.0)
MCHC: 33.2 g/dL (ref 30.0–36.0)
MCV: 87.9 fL (ref 78.0–100.0)
Platelets: 275 10*3/uL (ref 150–400)
RBC: 3.87 MIL/uL — ABNORMAL LOW (ref 4.22–5.81)
RDW: 12.5 % (ref 11.5–15.5)
WBC: 11.9 10*3/uL — AB (ref 4.0–10.5)

## 2016-10-17 LAB — COMPREHENSIVE METABOLIC PANEL
ALT: 24 U/L (ref 17–63)
ANION GAP: 9 (ref 5–15)
AST: 38 U/L (ref 15–41)
Albumin: 3.1 g/dL — ABNORMAL LOW (ref 3.5–5.0)
Alkaline Phosphatase: 57 U/L (ref 38–126)
BILIRUBIN TOTAL: 0.7 mg/dL (ref 0.3–1.2)
BUN: 25 mg/dL — AB (ref 6–20)
CHLORIDE: 104 mmol/L (ref 101–111)
CO2: 22 mmol/L (ref 22–32)
Calcium: 8.5 mg/dL — ABNORMAL LOW (ref 8.9–10.3)
Creatinine, Ser: 1.56 mg/dL — ABNORMAL HIGH (ref 0.61–1.24)
GFR, EST AFRICAN AMERICAN: 51 mL/min — AB (ref >60)
GFR, EST NON AFRICAN AMERICAN: 44 mL/min — AB (ref >60)
Glucose, Bld: 120 mg/dL — ABNORMAL HIGH (ref 65–99)
POTASSIUM: 4.2 mmol/L (ref 3.5–5.1)
Sodium: 135 mmol/L (ref 135–145)
TOTAL PROTEIN: 6.1 g/dL — AB (ref 6.5–8.1)

## 2016-10-17 LAB — GLUCOSE, CAPILLARY
GLUCOSE-CAPILLARY: 100 mg/dL — AB (ref 65–99)
GLUCOSE-CAPILLARY: 204 mg/dL — AB (ref 65–99)
GLUCOSE-CAPILLARY: 169 mg/dL — AB (ref 65–99)
GLUCOSE-CAPILLARY: 126 mg/dL — AB (ref 65–99)

## 2016-10-17 LAB — HEPARIN LEVEL (UNFRACTIONATED): Heparin Unfractionated: <0.1 IU/mL — ABNORMAL LOW (ref 0.30–0.70)

## 2016-10-17 LAB — HEMOGLOBIN A1C
HEMOGLOBIN A1C: 10.1 % — AB (ref 4.8–5.6)
MEAN PLASMA GLUCOSE: 243 mg/dL

## 2016-10-17 MED ORDER — HYDRALAZINE HCL 25 MG PO TABS
25.0000 mg | ORAL_TABLET | Freq: Three times a day (TID) | ORAL | Status: DC
  Administered 2016-10-17 – 2016-10-25 (×27): 25 mg via ORAL
  Filled 2016-10-17 (×27): qty 1

## 2016-10-17 MED ORDER — SODIUM CHLORIDE 0.9 % IV SOLN: INTRAVENOUS | Status: AC

## 2016-10-17 MED ORDER — ENOXAPARIN SODIUM 30 MG/0.3ML ~~LOC~~ SOLN
30.0000 mg | SUBCUTANEOUS | Status: DC
  Administered 2016-10-17: 30 mg via SUBCUTANEOUS
  Filled 2016-10-17: qty 0.3

## 2016-10-17 MED ORDER — CARVEDILOL 6.25 MG PO TABS
6.2500 mg | ORAL_TABLET | Freq: Two times a day (BID) | ORAL | Status: DC
  Administered 2016-10-17 – 2016-10-25 (×17): 6.25 mg via ORAL
  Filled 2016-10-17 (×17): qty 1

## 2016-10-17 NOTE — Plan of Care (Signed)
Problem: Safety: Goal: Ability to remain free from injury will improve Outcome: Progressing Patient uses call light as needed for assistance and is able to call RN/NT on white phone as needed, all belongings are on his bedside stand within his reach.

## 2016-10-17 NOTE — Progress Notes (Signed)
   10/17/16 1000  Clinical Encounter Type  Visited With Patient  Visit Type Initial;Spiritual support  Referral From Nurse  Consult/Referral To Chaplain  Spiritual Encounters  Spiritual Needs Emotional    Provided education KP:[id number] Page on chaplain for notary when paperwork is complete.

## 2016-10-17 NOTE — Progress Notes (Signed)
PROGRESS NOTE    Shawn Collins  ZOX:[id number] DOB: 06-20-1950 DOA: 10/15/2016 PCP: Nolene Ebbs, MD  Brief Narrative: 65/M with DM, HTN, DYslipidemia admitted with N/V and Abd discomfort, CT abd pelvis unremarkable Labs with elevated troponin, mild AKI and normal AST/ALT, lipase, WBC  Assessment & Plan:  Nausea/Vomiting/Abd pain -suspect Viral gastroenteritis -improving with supportive care -CT abd pelvis unremarkable -advance diet as tol, cut down IVF  Elevated Troponin -due to demand ischemia likely, flat trend 0.51-0.59 -ECHO with normal EF/wall motion -Appreciate Cards input, due to numerous cardiac risk factors, plan for Myoview in am   DM: -Last A1c 7.4 on 06/28/10. on amaryl at home -stable, continue SSI   AKI on CKD -improved back to baseline of 1.3-1.6  COPD/Smoking -stable  Uncontrolled HTN -added hydralazine, continue COreg and amlodipine -wean off Nitro gtt  DVT prophylaxis: lovenox Code Status: Full Code Family Communication:none at bedside Disposition Plan: Home tomorrow if improved  Consultants:   Cards    Subjective: Feels much better, no further vomiting  Objective: Vitals:   10/17/16 0410 10/17/16 0517 10/17/16 0540 10/17/16 0833  BP: (!) 149/81 (!) 169/94 (!) 147/73 (!) 189/93  Pulse: 62 70 70 71  Resp: (!) 9 11 19    Temp:  98.5 F (36.9 C)    TempSrc:      SpO2: 99% 100% 100%   Weight:  64.9 kg (143 lb)    Height:        Intake/Output Summary (Last 24 hours) at 10/17/16 1045 Last data filed at 10/17/16 0946  Gross per 24 hour  Intake          1292.67 ml  Output             1200 ml  Net            92.67 ml   Filed Weights   10/15/16 2325 10/16/16 0454 10/17/16 0517  Weight: 63.5 kg (140 lb) 65.4 kg (144 lb 2.9 oz) 64.9 kg (143 lb)    Examination:  General exam: Appears calm and comfortable, no distress Respiratory system: Clear to auscultation. Respiratory effort normal. Cardiovascular system: S1 & S2 heard, RRR. No  JVD, murmurs Gastrointestinal system: Abdomen is nondistended, soft and nontender.Normal bowel sounds heard. Central nervous system: Alert and oriented. No focal neurological deficits. Extremities: Symmetric 5 x 5 power. Skin: No rashes, lesions or ulcers Psychiatry: Judgement and insight appear normal. Mood & affect appropriate.     Data Reviewed:   CBC:  Recent Labs Lab 10/15/16 2329 10/17/16 0450  WBC 6.9 11.9*  HGB 13.3 11.3*  HCT 39.3 34.0*  MCV 87.5 87.9  PLT 268 811   Basic Metabolic Panel:  Recent Labs Lab 10/15/16 2329 10/16/16 0103 10/16/16 0728 10/17/16 0450  NA 136  --  134* 135  K 5.8* 4.5 4.7 4.2  CL 104  --  103 104  CO2 23  --  21* 22  GLUCOSE 228*  --  257* 120*  BUN 22*  --  19 25*  CREATININE 1.45*  --  1.31* 1.56*  CALCIUM 9.4  --  8.8* 8.5*   GFR: Estimated Creatinine Clearance: 42.2 mL/min (A) (by C-G formula based on SCr of 1.56 mg/dL (H)). Liver Function Tests:  Recent Labs Lab 10/15/16 2329 10/16/16 0728 10/17/16 0450  AST 48* 31 38  ALT 27 20 24   ALKPHOS 62 59 57  BILITOT 1.4* 0.7 0.7  PROT 7.2 6.2* 6.1*  ALBUMIN 3.7 3.2* 3.1*    Recent Labs  Lab 10/15/16 2329  LIPASE 18   No results for input(s): AMMONIA in the last 168 hours. Coagulation Profile: No results for input(s): INR, PROTIME in the last 168 hours. Cardiac Enzymes:  Recent Labs Lab 10/16/16 0217 10/16/16 0512 10/16/16 1014 10/16/16 1450  TROPONINI 0.69* 0.51* 0.55* 0.59*   BNP (last 3 results) No results for input(s): PROBNP in the last 8760 hours. HbA1C:  Recent Labs  10/16/16 0512  HGBA1C 10.1*   CBG:  Recent Labs Lab 10/16/16 0912 10/16/16 1142 10/16/16 1644 10/16/16 2119 10/17/16 0729  GLUCAP 269* 261* 109* 129* 100*   Lipid Profile:  Recent Labs  10/16/16 0512  CHOL 158  HDL 48  LDLCALC 100*  TRIG 49  CHOLHDL 3.3   Thyroid Function Tests: No results for input(s): TSH, T4TOTAL, FREET4, T3FREE, THYROIDAB in the last 72  hours. Anemia Panel: No results for input(s): VITAMINB12, FOLATE, FERRITIN, TIBC, IRON, RETICCTPCT in the last 72 hours. Urine analysis:    Component Value Date/Time   COLORURINE STRAW (A) 10/15/2016 2329   APPEARANCEUR CLEAR 10/15/2016 2329   LABSPEC 1.010 10/15/2016 2329   PHURINE 8.0 10/15/2016 2329   GLUCOSEU >=500 (A) 10/15/2016 2329   HGBUR SMALL (A) 10/15/2016 2329   BILIRUBINUR NEGATIVE 10/15/2016 2329   KETONESUR 5 (A) 10/15/2016 2329   PROTEINUR >=300 (A) 10/15/2016 2329   UROBILINOGEN 1.0 06/27/2010 0631   NITRITE NEGATIVE 10/15/2016 2329   LEUKOCYTESUR NEGATIVE 10/15/2016 2329   Sepsis Labs: @LABRCNTIP (procalcitonin:4,lacticidven:4)  ) Recent Results (from the past 240 hour(s))  MRSA PCR Screening     Status: None   Collection Time: 10/16/16  5:11 AM  Result Value Ref Range Status   MRSA by PCR NEGATIVE NEGATIVE Final    Comment:        The GeneXpert MRSA Assay (FDA approved for NASAL specimens only), is one component of a comprehensive MRSA colonization surveillance program. It is not intended to diagnose MRSA infection nor to guide or monitor treatment for MRSA infections.          Radiology Studies: Ct Abdomen Pelvis Wo Contrast  Result Date: 10/16/2016 CLINICAL DATA:  Nausea and vomiting. EXAM: CT ABDOMEN AND PELVIS WITHOUT CONTRAST TECHNIQUE: Multidetector CT imaging of the abdomen and pelvis was performed following the standard protocol without IV contrast. COMPARISON:  Contrast-enhanced CT 06/27/2010 FINDINGS: Lower chest: Breathing motion artifact. No consolidation or pleural effusion. Small hiatal hernia. Hepatobiliary: No evidence focal lesion allowing for lack contrast. Gallbladder physiologically distended, no calcified stone. No biliary dilatation. Pancreas: No ductal dilatation or inflammation. Lack of contrast and paucity of intra-abdominal fat limits detailed assessment. Spleen: Normal in size without focal abnormality. Adrenals/Urinary  Tract: No adrenal nodule. There is bilateral perinephric edema, right greater than left. This is seen on remote prior exam. No urolithiasis. No hydronephrosis. Ureters are decompressed. Urinary bladder is distended, no bladder wall thickening. Stomach/Bowel: Moderate stool throughout the colon with stool distending the rectum, question constipation. Small bowel is decompressed and not well evaluated. The stomach is decompressed. Small hiatal hernia. Portions of the appendix are tentatively identified, no pericecal or right lower quadrant inflammation to suggest appendicitis. Vascular/Lymphatic: Aortic atherosclerosis without aneurysm. No bulky adenopathy, and decreased sensitivity given lack of contrast and paucity of intra-abdominal fat. Reproductive: There is a well-defined 3.3 x 2.4 cm water density lesion in the right upper abdomen abutting the distal stomach. No surrounding inflammation. This is unchanged in size from prior exam. Other: No free fluid. No free air. Tiny fat containing umbilical hernia.  Musculoskeletal: There are no acute or suspicious osseous abnormalities. IMPRESSION: 1. Perinephric edema about both kidneys, may be chronic and was seen on prior exam. This suggest underlying urinary tract infection or renal inflammation. No urolithiasis. 2. Well-defined fluid density structure in the right upper abdomen measuring 3.3 cm, unchanged from prior exam. This is likely duplication or peritoneal cyst. No inflammation. Unchanged size of a course of 6 years is consistent with a benign etiology. 3. Aortic atherosclerosis.  No aneurysm. 4. Moderate stool burden throughout the colon, suggesting constipation. No obstruction. Electronically Signed   By: Jeb Levering M.D.   On: 10/16/2016 04:24   Dg Chest Portable 1 View  Result Date: 10/16/2016 CLINICAL DATA:  Weakness, nausea and vomiting x2 days EXAM: PORTABLE CHEST 1 VIEW COMPARISON:  09/11/2014 CXR FINDINGS: The heart size and mediastinal contours  are within normal limits. Both lungs are clear. The visualized skeletal structures are unremarkable. IMPRESSION: No active disease. Electronically Signed   By: Ashley Royalty M.D.   On: 10/16/2016 00:45   Dg Abd Portable 1v  Result Date: 10/16/2016 CLINICAL DATA:  Patient reports abdominal discomfort, patient reports he thinks it may be from vomiting X 2 days. EXAM: PORTABLE ABDOMEN - 1 VIEW COMPARISON:  CT, 10/16/2016 FINDINGS: There is no bowel dilation to suggest obstruction or adynamic ileus. No evidence of renal or ureteral stones. Soft tissues are unremarkable. No acute skeletal abnormality. IMPRESSION: No acute findings. Electronically Signed   By: Lajean Manes M.D.   On: 10/16/2016 11:33        Scheduled Meds: . amLODipine  10 mg Oral Daily  . aspirin  81 mg Oral Daily  . carvedilol  6.25 mg Oral BID WC  . hydrALAZINE  25 mg Oral Q8H  . insulin aspart  0-5 Units Subcutaneous QHS  . insulin aspart  0-9 Units Subcutaneous TID WC  . pantoprazole  40 mg Oral Q1200   Continuous Infusions: . sodium chloride 10 mL/hr at 10/17/16 0832  . nitroGLYCERIN 35 mcg/min (10/17/16 0433)     LOS: 1 day    Time spent: 74min    Domenic Polite, MD [facility] Pager [phone]  If 7PM-7AM, please contact night-coverage [email or web address] Password [email or web address] 10/17/2016, 10:45 AM

## 2016-10-17 NOTE — Progress Notes (Signed)
Progress Note  Patient Name: Shawn Collins Date of Encounter: 10/17/2016  Primary Cardiologist: Dr. Domenic Polite  Subjective   No complaints of CP or SOB  Inpatient Medications    Scheduled Meds: . amLODipine  10 mg Oral Daily  . aspirin  81 mg Oral Daily  . carvedilol  3.125 mg Oral BID WC  . hydrALAZINE  25 mg Oral Q8H  . insulin aspart  0-5 Units Subcutaneous QHS  . insulin aspart  0-9 Units Subcutaneous TID WC  . pantoprazole  40 mg Oral Q1200   Continuous Infusions: . sodium chloride 10 mL/hr at 10/17/16 0832  . nitroGLYCERIN 35 mcg/min (10/17/16 0433)   PRN Meds: acetaminophen, albuterol, morphine injection, ondansetron (ZOFRAN) IV, promethazine, zolpidem   Vital Signs    Vitals:   10/17/16 0410 10/17/16 0517 10/17/16 0540 10/17/16 0833  BP: (!) 149/81 (!) 169/94 (!) 147/73 (!) 189/93  Pulse: 62 70 70 71  Resp: (!) 9 11 19    Temp:  98.5 F (36.9 C)    TempSrc:      SpO2: 99% 100% 100%   Weight:  143 lb (64.9 kg)    Height:        Intake/Output Summary (Last 24 hours) at 10/17/16 0952 Last data filed at 10/17/16 0946  Gross per 24 hour  Intake          1292.67 ml  Output             1200 ml  Net            92.67 ml   Filed Weights   10/15/16 2325 10/16/16 0454 10/17/16 0517  Weight: 140 lb (63.5 kg) 144 lb 2.9 oz (65.4 kg) 143 lb (64.9 kg)    Telemetry    NSR - Personally Reviewed  ECG    NSR - Personally Reviewed  Physical Exam   GEN: No acute distress.   Neck: No JVD Cardiac: RRR, no murmurs, rubs, or gallops.  Respiratory: Clear to auscultation bilaterally. GI: Soft, nontender, non-distended  MS: No edema; No deformity. Neuro:  Nonfocal  Psych: Normal affect   Labs    Chemistry Recent Labs Lab 10/15/16 2329 10/16/16 0103 10/16/16 0728 10/17/16 0450  NA 136  --  134* 135  K 5.8* 4.5 4.7 4.2  CL 104  --  103 104  CO2 23  --  21* 22  GLUCOSE 228*  --  257* 120*  BUN 22*  --  19 25*  CREATININE 1.45*  --  1.31* 1.56*    CALCIUM 9.4  --  8.8* 8.5*  PROT 7.2  --  6.2* 6.1*  ALBUMIN 3.7  --  3.2* 3.1*  AST 48*  --  31 38  ALT 27  --  20 24  ALKPHOS 62  --  59 57  BILITOT 1.4*  --  0.7 0.7  GFRNONAA 48*  --  55* 44*  GFRAA 56*  --  >60 51*  ANIONGAP 9  --  10 9     Hematology Recent Labs Lab 10/15/16 2329 10/17/16 0450  WBC 6.9 11.9*  RBC 4.49 3.87*  HGB 13.3 11.3*  HCT 39.3 34.0*  MCV 87.5 87.9  MCH 29.6 29.2  MCHC 33.8 33.2  RDW 12.5 12.5  PLT 268 275    Cardiac Enzymes Recent Labs Lab 10/16/16 0217 10/16/16 0512 10/16/16 1014 10/16/16 1450  TROPONINI 0.69* 0.51* 0.55* 0.59*    Recent Labs Lab 10/15/16 2343  TROPIPOC 0.35*  BNPNo results for input(s): BNP, PROBNP in the last 168 hours.   DDimer No results for input(s): DDIMER in the last 168 hours.   Radiology    Ct Abdomen Pelvis Wo Contrast  Result Date: 10/16/2016 CLINICAL DATA:  Nausea and vomiting. EXAM: CT ABDOMEN AND PELVIS WITHOUT CONTRAST TECHNIQUE: Multidetector CT imaging of the abdomen and pelvis was performed following the standard protocol without IV contrast. COMPARISON:  Contrast-enhanced CT 06/27/2010 FINDINGS: Lower chest: Breathing motion artifact. No consolidation or pleural effusion. Small hiatal hernia. Hepatobiliary: No evidence focal lesion allowing for lack contrast. Gallbladder physiologically distended, no calcified stone. No biliary dilatation. Pancreas: No ductal dilatation or inflammation. Lack of contrast and paucity of intra-abdominal fat limits detailed assessment. Spleen: Normal in size without focal abnormality. Adrenals/Urinary Tract: No adrenal nodule. There is bilateral perinephric edema, right greater than left. This is seen on remote prior exam. No urolithiasis. No hydronephrosis. Ureters are decompressed. Urinary bladder is distended, no bladder wall thickening. Stomach/Bowel: Moderate stool throughout the colon with stool distending the rectum, question constipation. Small bowel is  decompressed and not well evaluated. The stomach is decompressed. Small hiatal hernia. Portions of the appendix are tentatively identified, no pericecal or right lower quadrant inflammation to suggest appendicitis. Vascular/Lymphatic: Aortic atherosclerosis without aneurysm. No bulky adenopathy, and decreased sensitivity given lack of contrast and paucity of intra-abdominal fat. Reproductive: There is a well-defined 3.3 x 2.4 cm water density lesion in the right upper abdomen abutting the distal stomach. No surrounding inflammation. This is unchanged in size from prior exam. Other: No free fluid. No free air. Tiny fat containing umbilical hernia. Musculoskeletal: There are no acute or suspicious osseous abnormalities. IMPRESSION: 1. Perinephric edema about both kidneys, may be chronic and was seen on prior exam. This suggest underlying urinary tract infection or renal inflammation. No urolithiasis. 2. Well-defined fluid density structure in the right upper abdomen measuring 3.3 cm, unchanged from prior exam. This is likely duplication or peritoneal cyst. No inflammation. Unchanged size of a course of 6 years is consistent with a benign etiology. 3. Aortic atherosclerosis.  No aneurysm. 4. Moderate stool burden throughout the colon, suggesting constipation. No obstruction. Electronically Signed   By: Jeb Levering M.D.   On: 10/16/2016 04:24   Dg Chest Portable 1 View  Result Date: 10/16/2016 CLINICAL DATA:  Weakness, nausea and vomiting x2 days EXAM: PORTABLE CHEST 1 VIEW COMPARISON:  09/11/2014 CXR FINDINGS: The heart size and mediastinal contours are within normal limits. Both lungs are clear. The visualized skeletal structures are unremarkable. IMPRESSION: No active disease. Electronically Signed   By: Ashley Royalty M.D.   On: 10/16/2016 00:45   Dg Abd Portable 1v  Result Date: 10/16/2016 CLINICAL DATA:  Patient reports abdominal discomfort, patient reports he thinks it may be from vomiting X 2 days.  EXAM: PORTABLE ABDOMEN - 1 VIEW COMPARISON:  CT, 10/16/2016 FINDINGS: There is no bowel dilation to suggest obstruction or adynamic ileus. No evidence of renal or ureteral stones. Soft tissues are unremarkable. No acute skeletal abnormality. IMPRESSION: No acute findings. Electronically Signed   By: Lajean Manes M.D.   On: 10/16/2016 11:33    Cardiac Studies   2D echo 09/2016 Study Conclusions  - Left ventricle: The cavity size was normal. Wall thickness was   normal. Systolic function was normal. The estimated ejection   fraction was in the range of 60% to 65%. Wall motion was normal;   there were no regional wall motion abnormalities. Doppler   parameters are  consistent with abnormal left ventricular   relaxation (grade 1 diastolic dysfunction). - Mitral valve: There was mild regurgitation. - Pericardium, extracardiac: A trivial pericardial effusion was   identified.  Patient Profile     66 y.o. male with PMH significant for HTN, NIDDM, dyslipidemia, and smoking who presented to the ED with two days of malaise, fatigue, and persistent nausea with bouts of emesis.   His ECG does not meet STEMI criteria but could represent subendocardial ischemia.  That being said he has been CP free while his symptoms have been present for more than 48H, and so I favor a primary GI illness in combination with hypertensive urgency resulting in demand ischemia as the etiology of these changes and the abnormal troponin, while an acute plaque rupture event seems less likely at this point given the time course of his symptoms.   Assessment & Plan    1.  Elevated troponin: Patient denies any chest pain and with flat trend in troponin (0.51>0.55>0.59) favor demand ischemia in the setting of hypertensive urgency and N/V as the etiology of his troponin elevation and ECG changes rather than true plaque rupture event.  2D echo showed normal LVF with EF 60-65% with no wall motion abnormalities. He does have CFRs  including HTN, dyslipidemia, smoking .  - will continue to trend troponin until it peaks - continue 81mg  daily, BB and IV Heparin gtt. - LDL 100 and LFTs have normalized.   - in setting of normal LVF will hold on cath and make patient NPO after MN - plan Lexiscan myoview in am to rule out ischemia.   2.   HTN: concern for hypertensive emergency given elevated troponin and AKI - BP remains poorly controlled.  - continue amlodipine 10mg  daily and increase coreg to 6.25mg  BID.   - continue Hydralazine 25mg  q8 hours and consider increasing to 50mg  TID if BP still not controlled on higher dose of Coreg. - wean off IV NTG gtt  3.  AKI: management per primary team.  Creatinine 1.56 this am.    Signed, Fransico Him, MD  10/17/2016, 9:52 AM

## 2016-10-17 NOTE — Plan of Care (Signed)
Problem: Pain Managment: Goal: General experience of comfort will improve Outcome: Progressing We have been using Zofran and Morphine to help the nausea which helps the soreness/achiness in his stomach, will continue to monitor.

## 2016-10-17 NOTE — Progress Notes (Signed)
Inpatient Diabetes Program Recommendations  AACE/ADA: New Consensus Statement on Inpatient Glycemic Control (2015)  Target Ranges:  Prepandial:   less than 140 mg/dL      Peak postprandial:   less than 180 mg/dL (1-2 hours)      Critically ill patients:  140 - 180 mg/dL  Results for Shawn Collins, Shawn Collins (MRN [id number]) as of 10/17/2016 13:27  Ref. Range 10/16/2016 09:12 10/16/2016 11:42 10/16/2016 16:44 10/16/2016 21:19 10/17/2016 07:29 10/17/2016 11:08  Glucose-Capillary Latest Ref Range: 65 - 99 mg/dL 269 (H) 261 (H) 109 (H) 129 (H) 100 (H) 204 (H)   Results for SIRE, POET (MRN [id number]) as of 10/17/2016 13:27  Ref. Range 10/15/2016 23:29 10/16/2016 05:12 10/16/2016 07:28 10/17/2016 04:50  Glucose Latest Ref Range: 65 - 99 mg/dL 228 (H)  257 (H) 120 (H)  Hemoglobin A1C Latest Ref Range: 4.8 - 5.6 %  10.1 (H)     Review of Glycemic Control  Diabetes history: DM2 Outpatient Diabetes medications: Toujeo 20 units QHS, Amaryl 4 mg QAM Current orders for Inpatient glycemic control: Novolog 0-9 units TID with meals, Novolog 0-5 units QHS  Inpatient Diabetes Program Recommendations: HgbA1C: A1C 10.1% on 10/16/16 indicating an average glucose of 243 mg/dl over the past 2-3 months. Recommend patient follow up with PCP regarding DM control. Insulin-Basal- If glucose is consistently trending over 180 mg/dl, may want to consider ordering low dose basal insulin (such as Levemir 6 units Q24H based on 64 kg x 0.1 units).  NOTE: Spoke with patient about diabetes and home regimen for diabetes control. Patient lying in fetal position guarding abdomen and vomited once while briefly in room with patient. Patient reports he is in pain and nauseated but states he thinks the nurse has given him medication for both. Talked briefly with patient and he reports that he is followed by PCP for diabetes management and currently he takes "insulin" (not sure of name) 20 units QHS and Amaryl 4 mg daily as an outpatient for diabetes  control. Patient stated he had his insulin pen in his pants pocket in his belongings bag. With patient's permission, found insulin pen which was Toujeo then returned to patient's pants pocket and placed back in patient closet.  Patient reports that he is taking insulin as prescribed and that no changes were made with his DM medications at his last office visit. Inquired about prior A1C and patient reports that his last A1C was in the 9% range.  Discussed A1C results (10.1% on 10/16/16) and explained that his current A1C indicates an average glucose of 243 mg/dl over the past 2-3 months. Discussed glucose and A1C goals. Discussed importance of checking CBGs and maintaining good CBG control to prevent long-term and short-term complications. Encouraged patient to make follow up appointment with PCP regarding improving DM control and may benefit from referral to an Endocrinologist. Patient verbalized understanding of information discussed and he states that he has no further questions at this time related to diabetes. Talked with Otila Kluver, RN regarding patient's pain and nausea; she confirms that patient has recently received medication for pain and nausea.   Thanks, Barnie Alderman, RN, MSN, CDE Diabetes Coordinator Inpatient Diabetes Program [phone] (Team Pager)

## 2016-10-17 NOTE — Progress Notes (Signed)
Updated report received in patient's room via Costella Hatcher RN using SBAR format, reviewed VS, new orders and meds, assumed care of patient.

## 2016-10-18 ENCOUNTER — Inpatient Hospital Stay (HOSPITAL_COMMUNITY): Payer: Medicare Other

## 2016-10-18 DIAGNOSIS — J449 Chronic obstructive pulmonary disease, unspecified: Secondary | ICD-10-CM

## 2016-10-18 DIAGNOSIS — R748 Abnormal levels of other serum enzymes: Secondary | ICD-10-CM

## 2016-10-18 DIAGNOSIS — R9439 Abnormal result of other cardiovascular function study: Secondary | ICD-10-CM | POA: Diagnosis not present

## 2016-10-18 DIAGNOSIS — N183 Chronic kidney disease, stage 3 (moderate): Secondary | ICD-10-CM

## 2016-10-18 LAB — BASIC METABOLIC PANEL
ANION GAP: 9 (ref 5–15)
BUN: 27 mg/dL — ABNORMAL HIGH (ref 6–20)
CALCIUM: 8.6 mg/dL — AB (ref 8.9–10.3)
CO2: 22 mmol/L (ref 22–32)
Chloride: 104 mmol/L (ref 101–111)
Creatinine, Ser: 1.6 mg/dL — ABNORMAL HIGH (ref 0.61–1.24)
GFR, EST AFRICAN AMERICAN: 50 mL/min — AB (ref >60)
GFR, EST NON AFRICAN AMERICAN: 43 mL/min — AB (ref >60)
Glucose, Bld: 137 mg/dL — ABNORMAL HIGH (ref 65–99)
POTASSIUM: 3.8 mmol/L (ref 3.5–5.1)
Sodium: 135 mmol/L (ref 135–145)

## 2016-10-18 LAB — CBC
HEMATOCRIT: 33.9 % — AB (ref 39.0–52.0)
Hemoglobin: 11.1 g/dL — ABNORMAL LOW (ref 13.0–17.0)
MCH: 28.7 pg (ref 26.0–34.0)
MCHC: 32.7 g/dL (ref 30.0–36.0)
MCV: 87.6 fL (ref 78.0–100.0)
Platelets: 285 10*3/uL (ref 150–400)
RBC: 3.87 MIL/uL — AB (ref 4.22–5.81)
RDW: 12.4 % (ref 11.5–15.5)
WBC: 10.3 10*3/uL (ref 4.0–10.5)

## 2016-10-18 LAB — NM MYOCAR MULTI W/SPECT W/WALL MOTION / EF
LV sys vol: 57 mL
LVDIAVOL: 95 mL (ref 62–150)
Peak HR: 97 {beats}/min
Rest HR: 79 {beats}/min
TID: 0.99

## 2016-10-18 LAB — GLUCOSE, CAPILLARY
GLUCOSE-CAPILLARY: 147 mg/dL — AB (ref 65–99)
GLUCOSE-CAPILLARY: 175 mg/dL — AB (ref 65–99)
GLUCOSE-CAPILLARY: 150 mg/dL — AB (ref 65–99)
Glucose-Capillary: 164 mg/dL — ABNORMAL HIGH (ref 65–99)

## 2016-10-18 MED ORDER — SODIUM CHLORIDE 0.9 % WEIGHT BASED INFUSION
1.0000 mL/kg/h | INTRAVENOUS | Status: DC
  Administered 2016-10-18: 1 mL/kg/h via INTRAVENOUS

## 2016-10-18 MED ORDER — SODIUM CHLORIDE 0.9% FLUSH
3.0000 mL | Freq: Two times a day (BID) | INTRAVENOUS | Status: DC
  Administered 2016-10-18 – 2016-10-21 (×5): 3 mL via INTRAVENOUS

## 2016-10-18 MED ORDER — TECHNETIUM TC 99M TETROFOSMIN IV KIT
30.0000 | PACK | Freq: Once | INTRAVENOUS | Status: AC | PRN
  Administered 2016-10-18: 30 via INTRAVENOUS

## 2016-10-18 MED ORDER — REGADENOSON 0.4 MG/5ML IV SOLN
0.4000 mg | Freq: Once | INTRAVENOUS | Status: AC
  Administered 2016-10-18: 0.4 mg via INTRAVENOUS
  Filled 2016-10-18: qty 5

## 2016-10-18 MED ORDER — PANTOPRAZOLE SODIUM 40 MG PO TBEC
40.0000 mg | DELAYED_RELEASE_TABLET | Freq: Two times a day (BID) | ORAL | Status: DC
  Administered 2016-10-18 – 2016-10-25 (×16): 40 mg via ORAL
  Filled 2016-10-18 (×16): qty 1

## 2016-10-18 MED ORDER — MORPHINE SULFATE (PF) 2 MG/ML IV SOLN
2.0000 mg | INTRAVENOUS | Status: DC | PRN
  Filled 2016-10-18: qty 1

## 2016-10-18 MED ORDER — SODIUM CHLORIDE 0.9% FLUSH: 3.0000 mL | INTRAVENOUS | Status: DC | PRN

## 2016-10-18 MED ORDER — TECHNETIUM TC 99M TETROFOSMIN IV KIT
10.0000 | PACK | Freq: Once | INTRAVENOUS | Status: AC
  Administered 2016-10-18: 10 via INTRAVENOUS

## 2016-10-18 MED ORDER — REGADENOSON 0.4 MG/5ML IV SOLN
INTRAVENOUS | Status: AC
  Filled 2016-10-18: qty 5

## 2016-10-18 MED ORDER — SODIUM CHLORIDE 0.9 % IV SOLN: 250.0000 mL | INTRAVENOUS | Status: DC | PRN

## 2016-10-18 NOTE — Progress Notes (Signed)
Updated report received in patient's room via [place] and she reviewed new orders and meds ans events of the day, assumed care of patient.

## 2016-10-18 NOTE — Progress Notes (Signed)
D/w Dr. Broadus John, patient and staff - he is now agreeable to cath tomorrow. Ok for clears in the am - although he is not eating much, then NPO after. Will aggressively hydrate tonight for CKD.   Pixie Casino, MD, [place]  Attending Cardiologist  Direct Dial: [phone]  Fax: [phone]  Website:  [email or web address]

## 2016-10-18 NOTE — Progress Notes (Addendum)
PROGRESS NOTE    Shawn Collins  XBL:[id number] DOB: Dec 27, 1950 DOA: 10/15/2016 PCP: Nolene Ebbs, MD  Brief Narrative: 65/M with DM, HTN, DYslipidemia admitted with N/V and Abd discomfort, CT abd pelvis unremarkable Labs with elevated troponin, mild AKI and normal AST/ALT, lipase, WBC Myoview 5/22 abnormal  Assessment & Plan:  Nausea/Vomiting/Abd pain -suspect Viral gastroenteritis vs due to MI/ACS -CT Abd pelvis unremarkable -vomiting and pain better, but now with nausea -labs reassuring-lfts/lipase, WBC unremarkable now, but due to persistent nausea check RUQ [place] -Myoview today to eval for CAD which may be contributing to above symptoms -supportive care, add PPI, if no improvement in nausea will need GI eval  Elevated Troponin -due to demand ischemia likely, flat trend 0.51-0.59 -ECHO with normal EF/wall motion -Appreciate Cards input, due to numerous cardiac risk factors, plan for Myoview today   DM: -Last A1c 7.4 on 06/28/10. on amaryl at home -stable, continue SSI   AKI on CKD -improved back to baseline of 1.3-1.6 -stable  COPD/Smoking -stable  Uncontrolled HTN -added hydralazine, continue COreg and amlodipine -weaned off Nitro gtt  DVT prophylaxis: lovenox Code Status: Full Code Family Communication:none at bedside Disposition Plan: Home tomorrow if improved  Consultants:   Cards    Subjective: Feels much better, no more pain or vomiting, but quite nauseous this am  Objective: Vitals:   10/18/16 0518 10/18/16 0600 10/18/16 0756 10/18/16 1100  BP: (!) 147/86 (!) 144/80 (!) 168/89 (!) 152/79  Pulse:  81 87   Resp:  10 15 18   Temp:   99 F (37.2 C)   TempSrc:   Oral   SpO2:  94% 96%   Weight:      Height:        Intake/Output Summary (Last 24 hours) at 10/18/16 1111 Last data filed at 10/18/16 0500  Gross per 24 hour  Intake            450.2 ml  Output              600 ml  Net           -149.8 ml   Filed Weights   10/16/16 0454 10/17/16  0517 10/18/16 0315  Weight: 65.4 kg (144 lb 2.9 oz) 64.9 kg (143 lb) 64.6 kg (142 lb 8 oz)    Examination:  General exam: uncomfortable, alert, awake,  Respiratory system: CTAB Cardiovascular system: S1 & S2 heard, RRR. No JVD, murmurs Gastrointestinal system: soft, NT, BS present Central nervous system: Alert and oriented. No focal neurological deficits. Extremities: Symmetric 5 x 5 power. Skin: No rashes, lesions or ulcers Psychiatry: Judgement and insight appear normal. Mood & affect appropriate.     Data Reviewed:   CBC:  Recent Labs Lab 10/15/16 2329 10/17/16 0450 10/18/16 0331  WBC 6.9 11.9* 10.3  HGB 13.3 11.3* 11.1*  HCT 39.3 34.0* 33.9*  MCV 87.5 87.9 87.6  PLT 268 275 300   Basic Metabolic Panel:  Recent Labs Lab 10/15/16 2329 10/16/16 0103 10/16/16 0728 10/17/16 0450 10/18/16 0331  NA 136  --  134* 135 135  K 5.8* 4.5 4.7 4.2 3.8  CL 104  --  103 104 104  CO2 23  --  21* 22 22  GLUCOSE 228*  --  257* 120* 137*  BUN 22*  --  19 25* 27*  CREATININE 1.45*  --  1.31* 1.56* 1.60*  CALCIUM 9.4  --  8.8* 8.5* 8.6*   GFR: Estimated Creatinine Clearance: 40.9 mL/min (A) (by C-G formula  based on SCr of 1.6 mg/dL (H)). Liver Function Tests:  Recent Labs Lab 10/15/16 2329 10/16/16 0728 10/17/16 0450  AST 48* 31 38  ALT 27 20 24   ALKPHOS 62 59 57  BILITOT 1.4* 0.7 0.7  PROT 7.2 6.2* 6.1*  ALBUMIN 3.7 3.2* 3.1*    Recent Labs Lab 10/15/16 2329  LIPASE 18   No results for input(s): AMMONIA in the last 168 hours. Coagulation Profile: No results for input(s): INR, PROTIME in the last 168 hours. Cardiac Enzymes:  Recent Labs Lab 10/16/16 0217 10/16/16 0512 10/16/16 1014 10/16/16 1450  TROPONINI 0.69* 0.51* 0.55* 0.59*   BNP (last 3 results) No results for input(s): PROBNP in the last 8760 hours. HbA1C:  Recent Labs  10/16/16 0512  HGBA1C 10.1*   CBG:  Recent Labs Lab 10/17/16 0729 10/17/16 1108 10/17/16 1644 10/17/16 2120  10/18/16 0758  GLUCAP 100* 204* 169* 126* 164*   Lipid Profile:  Recent Labs  10/16/16 0512  CHOL 158  HDL 48  LDLCALC 100*  TRIG 49  CHOLHDL 3.3   Thyroid Function Tests: No results for input(s): TSH, T4TOTAL, FREET4, T3FREE, THYROIDAB in the last 72 hours. Anemia Panel: No results for input(s): VITAMINB12, FOLATE, FERRITIN, TIBC, IRON, RETICCTPCT in the last 72 hours. Urine analysis:    Component Value Date/Time   COLORURINE STRAW (A) 10/15/2016 2329   APPEARANCEUR CLEAR 10/15/2016 2329   LABSPEC 1.010 10/15/2016 2329   PHURINE 8.0 10/15/2016 2329   GLUCOSEU >=500 (A) 10/15/2016 2329   HGBUR SMALL (A) 10/15/2016 2329   BILIRUBINUR NEGATIVE 10/15/2016 2329   KETONESUR 5 (A) 10/15/2016 2329   PROTEINUR >=300 (A) 10/15/2016 2329   UROBILINOGEN 1.0 06/27/2010 0631   NITRITE NEGATIVE 10/15/2016 2329   LEUKOCYTESUR NEGATIVE 10/15/2016 2329   Sepsis Labs: @LABRCNTIP (procalcitonin:4,lacticidven:4)  ) Recent Results (from the past 240 hour(s))  MRSA PCR Screening     Status: None   Collection Time: 10/16/16  5:11 AM  Result Value Ref Range Status   MRSA by PCR NEGATIVE NEGATIVE Final    Comment:        The GeneXpert MRSA Assay (FDA approved for NASAL specimens only), is one component of a comprehensive MRSA colonization surveillance program. It is not intended to diagnose MRSA infection nor to guide or monitor treatment for MRSA infections.          Radiology Studies: Dg Abd Portable 1v  Result Date: 10/16/2016 CLINICAL DATA:  Patient reports abdominal discomfort, patient reports he thinks it may be from vomiting X 2 days. EXAM: PORTABLE ABDOMEN - 1 VIEW COMPARISON:  CT, 10/16/2016 FINDINGS: There is no bowel dilation to suggest obstruction or adynamic ileus. No evidence of renal or ureteral stones. Soft tissues are unremarkable. No acute skeletal abnormality. IMPRESSION: No acute findings. Electronically Signed   By: Lajean Manes M.D.   On: 10/16/2016 11:33          Scheduled Meds: . amLODipine  10 mg Oral Daily  . aspirin  81 mg Oral Daily  . carvedilol  6.25 mg Oral BID WC  . enoxaparin (LOVENOX) injection  30 mg Subcutaneous Q24H  . hydrALAZINE  25 mg Oral Q8H  . insulin aspart  0-5 Units Subcutaneous QHS  . insulin aspart  0-9 Units Subcutaneous TID WC  . pantoprazole  40 mg Oral BID   Continuous Infusions:    LOS: 2 days    Time spent: 16min    Domenic Polite, MD [facility] Pager [phone]  If 7PM-7AM, please  contact night-coverage [email or web address] Password [email or web address] 10/18/2016, 11:11 AM

## 2016-10-18 NOTE — Progress Notes (Signed)
Progress Note  Patient Name: Shawn Collins Date of Encounter: 10/18/2016  Primary Cardiologist: Dr. Domenic Polite  Subjective   No complaints of CP or SOB. Pt seen in [facility]  Inpatient Medications    Scheduled Meds: . amLODipine  10 mg Oral Daily  . aspirin  81 mg Oral Daily  . carvedilol  6.25 mg Oral BID WC  . enoxaparin (LOVENOX) injection  30 mg Subcutaneous Q24H  . hydrALAZINE  25 mg Oral Q8H  . insulin aspart  0-5 Units Subcutaneous QHS  . insulin aspart  0-9 Units Subcutaneous TID WC  . pantoprazole  40 mg Oral BID  . regadenoson       Continuous Infusions:  PRN Meds: acetaminophen, albuterol, morphine injection, ondansetron (ZOFRAN) IV, promethazine, zolpidem   Vital Signs    Vitals:   10/18/16 1154 10/18/16 1155 10/18/16 1158 10/18/16 1417  BP: (!) 145/75 122/71 113/70 (!) 142/78  Pulse: 88 84 82 81  Resp:    19  Temp:    99 F (37.2 C)  TempSrc:    Oral  SpO2:    96%  Weight:      Height:        Intake/Output Summary (Last 24 hours) at 10/18/16 1553 Last data filed at 10/18/16 0820  Gross per 24 hour  Intake           134.48 ml  Output              600 ml  Net          -465.52 ml   Filed Weights   10/16/16 0454 10/17/16 0517 10/18/16 0315  Weight: 144 lb 2.9 oz (65.4 kg) 143 lb (64.9 kg) 142 lb 8 oz (64.6 kg)    Telemetry    Seen in [facility]  Physical Exam   GEN: Well nourished, well developed HEENT: normal  Neck: no JVD, carotid bruits, or masses Cardiac: RRR. no murmurs, rubs, or gallops,no edema. Intact distal pulses bilaterally.  Respiratory: clear to auscultation bilaterally, normal work of breathing GI: soft, nontender, nondistended, + BS MS: no deformity or atrophy  Skin: warm and dry, no rash Neuro: Alert and Oriented x 3, Strength and sensation are intact Psych:   Full affect  Labs    Chemistry Recent Labs Lab 10/15/16 2329  10/16/16 0728 10/17/16 0450 10/18/16 0331  NA 136  --  134* 135 135  K 5.8*  < > 4.7 4.2  3.8  CL 104  --  103 104 104  CO2 23  --  21* 22 22  GLUCOSE 228*  --  257* 120* 137*  BUN 22*  --  19 25* 27*  CREATININE 1.45*  --  1.31* 1.56* 1.60*  CALCIUM 9.4  --  8.8* 8.5* 8.6*  PROT 7.2  --  6.2* 6.1*  --   ALBUMIN 3.7  --  3.2* 3.1*  --   AST 48*  --  31 38  --   ALT 27  --  20 24  --   ALKPHOS 62  --  59 57  --   BILITOT 1.4*  --  0.7 0.7  --   GFRNONAA 48*  --  55* 44* 43*  GFRAA 56*  --  >60 51* 50*  ANIONGAP 9  --  10 9 9   < > = values in this interval not displayed.   Hematology Recent Labs Lab 10/15/16 2329 10/17/16 0450 10/18/16 0331  WBC 6.9 11.9* 10.3  RBC 4.49  3.87* 3.87*  HGB 13.3 11.3* 11.1*  HCT 39.3 34.0* 33.9*  MCV 87.5 87.9 87.6  MCH 29.6 29.2 28.7  MCHC 33.8 33.2 32.7  RDW 12.5 12.5 12.4  PLT 268 275 285    Cardiac Enzymes Recent Labs Lab 10/16/16 0217 10/16/16 0512 10/16/16 1014 10/16/16 1450  TROPONINI 0.69* 0.51* 0.55* 0.59*    Recent Labs Lab 10/15/16 2343  TROPIPOC 0.35*     BNPNo results for input(s): BNP, PROBNP in the last 168 hours.   DDimer No results for input(s): DDIMER in the last 168 hours.   Radiology    Nm Myocar Multi W/spect W/wall Motion / Ef  Result Date: 10/18/2016  There was no ST segment deviation noted during stress.  Defect 1: There is a large defect of moderate severity present in the basal inferoseptal, basal inferior, mid inferoseptal, mid inferior, apical septal and apical inferior location.  Defect 2: There is a large defect of severe severity present in the basal anterior, basal anteroseptal, mid anterior, mid anteroseptal, apical anterior, apical septal and apex location.  Findings consistent with prior myocardial infarction in the LAD and RCA regions with very mild peri-infarct ischemia in the apical inferior and apical septum.  This is an intermediate risk study due to reduced systolic function.  The left ventricular ejection fraction is moderately decreased (30-44%).    US Abdomen Limited  Ruq  Result Date: 10/18/2016 CLINICAL DATA:  66 year old diabetic hypertensive male with persistent nausea since 10/15/2016. Subsequent encounter. EXAM: US ABDOMEN LIMITED - RIGHT UPPER QUADRANT COMPARISON:  10/16/2016 CT. FINDINGS: Gallbladder: No gallstones or wall thickening visualized. No sonographic Murphy sign noted by sonographer. Common bile duct: Diameter: 7.7 mm. Distal aspect not visualized secondary to bowel gas Liver: Mild intrahepatic biliary duct dilation.  No focal hepatic lesion. IMPRESSION: Gallbladder unremarkable. Slight prominence of common bile duct (7.7 mm) and mild intrahepatic biliary duct dilation suspected. Correlation with liver function studies recommended. Electronically Signed   By: Genia Del M.D.   On: 10/18/2016 14:19    Cardiac Studies    2D echo 09/2016 Study Conclusions  - Left ventricle: The cavity size was normal. Wall thickness was normal. Systolic function was normal. The estimated ejection fraction was in the range of 60% to 65%. Wall motion was normal; there were no regional wall motion abnormalities. Doppler parameters are consistent with abnormal left ventricular relaxation (grade 1 diastolic dysfunction). - Mitral valve: There was mild regurgitation. - Pericardium, extracardiac: A trivial pericardial effusion was identified.  Lexiscan  10/18/2016 Study Result    There was no ST segment deviation noted during stress.  Defect 1: There is a large defect of moderate severity present in the basal inferoseptal, basal inferior, mid inferoseptal, mid inferior, apical septal and apical inferior location.  Defect 2: There is a large defect of severe severity present in the basal anterior, basal anteroseptal, mid anterior, mid anteroseptal, apical anterior, apical septal and apex location.  Findings consistent with prior myocardial infarction in the LAD and RCA regions with very mild peri-infarct ischemia in the apical inferior and  apical septum.  This is an intermediate risk study due to reduced systolic function.  The left ventricular ejection fraction is moderately decreased (30-44%).     Patient Profile     66 y.o. male with PMH significant for HTN, NIDDM, dyslipidemia, and smoking who presented to the ED with two days of malaise, fatigue, and persistent nausea with bouts of emesis. His ECG does not meet STEMI criteria but could  represent subendocardial ischemia. That being said he has been CP free while his symptoms have been present for more than 48H, and so I favor a primary GI illness in combination with hypertensive urgency resulting in demand ischemia as the etiology of these changes and the abnormal troponin, while an acute plaque rupture event seems less likely at this point given the time course of his symptoms.   Assessment & Plan    1.  Elevated troponin: Troponin is (0.51>0.55>0.59) Demand ischemia favored in the setting of hypertensive urgency. 2 D echo done today showed normal LVF with EF 60-65% with no wall motion abnormalities. - continue 81mg  daily, BB and IV Heparin gtt. - Lexiscan Myoview done today, the results are abnormal. Intermediate risk study with EF moderately decreased at 30-44%. Will discuss disposition with Dr. Debara Pickett.  2.   HTN: concern for hypertensive emergency given elevated troponin and AKI --  Better control of BP today, but still poorly controlled. --  BP remains poorly controlled.  --  Continue amlodipine 10mg  daily. Coreg increased yesterday to 6.25mg  BID.   --  Continue Hydralazine 25mg  q8 hours and I do recommend   increasing to 50mg  TID because BP is not well controlled on higher dose of Coreg.  3.  AKI: management per primary team.  Creatinine 1.60 this am.   Kristopher Glee, PA-C  10/18/2016, 3:53 PM

## 2016-10-18 NOTE — Progress Notes (Signed)
Patient for nuclear stress test today.  Await results for further recommendations.

## 2016-10-18 NOTE — Progress Notes (Signed)
PT Cancellation Note  Patient Details Name: Shawn Collins MRN: [id number] DOB: 04-01-1951   Cancelled Treatment:    Reason Eval/Treat Not Completed: Other (comment). Pt reports too nauseous to amb.   Shary Decamp Maycok 10/18/2016, 2:03 PM [employer] PT [phone]

## 2016-10-19 ENCOUNTER — Encounter (HOSPITAL_COMMUNITY)
Admission: EM | Disposition: A | Discharge status: Skilled Nursing Facility | Payer: Self-pay | Source: Home / Self Care | Attending: Cardiothoracic Surgery

## 2016-10-19 DIAGNOSIS — I10 Essential (primary) hypertension: Secondary | ICD-10-CM

## 2016-10-19 LAB — URINALYSIS, ROUTINE W REFLEX MICROSCOPIC
Bacteria, UA: NONE SEEN
Bilirubin Urine: NEGATIVE
GLUCOSE, UA: 50 mg/dL — AB
HGB URINE DIPSTICK: NEGATIVE
Ketones, ur: 5 mg/dL — AB
Leukocytes, UA: NEGATIVE
Nitrite: NEGATIVE
PH: 5 (ref 5.0–8.0)
Protein, ur: 100 mg/dL — AB
SPECIFIC GRAVITY, URINE: 1.02 (ref 1.005–1.030)
SQUAMOUS EPITHELIAL / LPF: NONE SEEN

## 2016-10-19 LAB — COMPREHENSIVE METABOLIC PANEL
ALBUMIN: 3 g/dL — AB (ref 3.5–5.0)
ALK PHOS: 55 U/L (ref 38–126)
ALT: 31 U/L (ref 17–63)
AST: 44 U/L — ABNORMAL HIGH (ref 15–41)
Anion gap: 10 (ref 5–15)
BILIRUBIN TOTAL: 0.8 mg/dL (ref 0.3–1.2)
BUN: 41 mg/dL — ABNORMAL HIGH (ref 6–20)
CALCIUM: 8.7 mg/dL — AB (ref 8.9–10.3)
CO2: 22 mmol/L (ref 22–32)
CREATININE: 1.91 mg/dL — AB (ref 0.61–1.24)
Chloride: 103 mmol/L (ref 101–111)
GFR calc non Af Amer: 35 mL/min — ABNORMAL LOW (ref >60)
GFR, EST AFRICAN AMERICAN: 40 mL/min — AB (ref >60)
Glucose, Bld: 180 mg/dL — ABNORMAL HIGH (ref 65–99)
Potassium: 4 mmol/L (ref 3.5–5.1)
SODIUM: 135 mmol/L (ref 135–145)
Total Protein: 6.3 g/dL — ABNORMAL LOW (ref 6.5–8.1)

## 2016-10-19 LAB — C-REACTIVE PROTEIN: CRP: <0.8 mg/dL (ref <1.0)

## 2016-10-19 LAB — BASIC METABOLIC PANEL
ANION GAP: 10 (ref 5–15)
ANION GAP: 10 (ref 5–15)
BUN: 48 mg/dL — ABNORMAL HIGH (ref 6–20)
BUN: 48 mg/dL — ABNORMAL HIGH (ref 6–20)
CALCIUM: 8.9 mg/dL (ref 8.9–10.3)
CALCIUM: 8.8 mg/dL — AB (ref 8.9–10.3)
CHLORIDE: 107 mmol/L (ref 101–111)
CHLORIDE: 108 mmol/L (ref 101–111)
CO2: 21 mmol/L — AB (ref 22–32)
CO2: 21 mmol/L — AB (ref 22–32)
CREATININE: 2 mg/dL — AB (ref 0.61–1.24)
Creatinine, Ser: 2.02 mg/dL — ABNORMAL HIGH (ref 0.61–1.24)
GFR calc Af Amer: 38 mL/min — ABNORMAL LOW (ref >60)
GFR calc non Af Amer: 32 mL/min — ABNORMAL LOW (ref >60)
GFR, EST AFRICAN AMERICAN: 38 mL/min — AB (ref >60)
GFR, EST NON AFRICAN AMERICAN: 33 mL/min — AB (ref >60)
Glucose, Bld: 144 mg/dL — ABNORMAL HIGH (ref 65–99)
Glucose, Bld: 144 mg/dL — ABNORMAL HIGH (ref 65–99)
Potassium: 3.7 mmol/L (ref 3.5–5.1)
Potassium: 3.8 mmol/L (ref 3.5–5.1)
SODIUM: 138 mmol/L (ref 135–145)
SODIUM: 139 mmol/L (ref 135–145)

## 2016-10-19 LAB — CBC
HEMATOCRIT: 34.5 % — AB (ref 39.0–52.0)
HEMOGLOBIN: 11.4 g/dL — AB (ref 13.0–17.0)
MCH: 29.2 pg (ref 26.0–34.0)
MCHC: 33 g/dL (ref 30.0–36.0)
MCV: 88.2 fL (ref 78.0–100.0)
Platelets: 271 10*3/uL (ref 150–400)
RBC: 3.91 MIL/uL — AB (ref 4.22–5.81)
RDW: 12.5 % (ref 11.5–15.5)
WBC: 11.9 10*3/uL — ABNORMAL HIGH (ref 4.0–10.5)

## 2016-10-19 LAB — GLUCOSE, CAPILLARY
GLUCOSE-CAPILLARY: 131 mg/dL — AB (ref 65–99)
Glucose-Capillary: 197 mg/dL — ABNORMAL HIGH (ref 65–99)
Glucose-Capillary: 177 mg/dL — ABNORMAL HIGH (ref 65–99)
Glucose-Capillary: 176 mg/dL — ABNORMAL HIGH (ref 65–99)

## 2016-10-19 LAB — LIPASE, BLOOD: LIPASE: 16 U/L (ref 11–51)

## 2016-10-19 LAB — PROTIME-INR
INR: 1.22
Prothrombin Time: 15.4 seconds — ABNORMAL HIGH (ref 11.4–15.2)

## 2016-10-19 LAB — SEDIMENTATION RATE: Sed Rate: 29 mm/hr — ABNORMAL HIGH (ref 0–16)

## 2016-10-19 LAB — PROCALCITONIN: PROCALCITONIN: 0.17 ng/mL

## 2016-10-19 SURGERY — LEFT HEART CATH AND CORONARY ANGIOGRAPHY
Anesthesia: LOCAL

## 2016-10-19 MED ORDER — POLYETHYLENE GLYCOL 3350 17 G PO PACK
17.0000 g | PACK | Freq: Every day | ORAL | Status: DC
  Administered 2016-10-19 – 2016-10-23 (×5): 17 g via ORAL
  Filled 2016-10-19 (×8): qty 1

## 2016-10-19 MED ORDER — METOCLOPRAMIDE HCL 5 MG/ML IJ SOLN
5.0000 mg | Freq: Four times a day (QID) | INTRAMUSCULAR | Status: DC
  Administered 2016-10-19 – 2016-10-21 (×9): 5 mg via INTRAVENOUS
  Filled 2016-10-19 (×9): qty 2

## 2016-10-19 MED ORDER — ENOXAPARIN SODIUM 40 MG/0.4ML ~~LOC~~ SOLN
40.0000 mg | SUBCUTANEOUS | Status: DC
  Administered 2016-10-20: 40 mg via SUBCUTANEOUS
  Filled 2016-10-19: qty 0.4

## 2016-10-19 MED ORDER — SENNOSIDES-DOCUSATE SODIUM 8.6-50 MG PO TABS
1.0000 | ORAL_TABLET | Freq: Two times a day (BID) | ORAL | Status: DC
  Administered 2016-10-19 – 2016-10-25 (×13): 1 via ORAL
  Filled 2016-10-19 (×14): qty 1

## 2016-10-19 MED ORDER — SODIUM CHLORIDE 0.9 % IV SOLN
INTRAVENOUS | Status: DC
  Administered 2016-10-20 – 2016-10-21 (×2): via INTRAVENOUS

## 2016-10-19 NOTE — Progress Notes (Signed)
[facility] Progress Note  Patient: Shawn Collins SWF:[id number]   PCP: Nolene Ebbs, MD DOB: 26-May-1951   DOA: 10/15/2016   DOS: 10/19/2016   Date of Service: the patient was seen and examined on 10/19/2016  Subjective: having chills and feeling feverish, no more nausea or Vomiting. No abdominal pain. No diarrhea. No burning urination reported. Cough.  Brief hospital course: Pt. with PMH of type II DM, HTN, dyslipidemia; admitted on 10/15/2016, presented with complaint of nausea vomiting, was found to have non-STEMI with abnormal stress test. Currently further plan is further cardiac workup.  Assessment and Plan: 1. Nausea vomiting and abdominal pain. Likely viral gastroenteritis. Possibility of gastroparesis cannot be ruled out. CT abdomen pelvis unremarkable, x-ray abdomen unremarkable. Right upper quadrant ultrasound showsDilated CBD which is unchanged from prior ultrasound. Continue supportive measures, placing on scheduled Reglan. Stool softener.  2. Non-STEMI. Abnormal stress test. Troponins are trending downwards. Could be contributing to patient's nausea and vomiting. Cardiology consulted, plan is to perform cardiac catheterization although serum creatinine worsening prior to cath. Probably cartilage would like to continue IV hydration. We'll monitor recommendation. Continue on amlodipine, carvedilol, no indication for diuretics at present. Echo 60-65% EF.  3. Acute on chronic kidney disease stage IV. Renal function progressively worsening. No significant nephrotoxic substances provided, we will continue to monitor at present. Avoid any further nephrotoxic medication, continue IV hydration. Patient is going to receive contrast for cardiac catheterization which may worsen renal function. Due to poor by mouth intake as well as continues use of lisinopril. CT scan does not show any obstruction but does show perinephric edema which is unchanged from prior years. UA x2  bland.  4. Type II DM. On Amaryl at home, hemoglobin A1c 10.1. Uncontrolled. Hyperglycemia. Continue sliding scale insulin, possibly due to diabetic gastroparesis causing nausea and vomiting cannot be ruled out. Oral hypoglycemic agents as well as long-acting insulin is currently on hold due to poor by mouth intake.  5. Fever with chills. Extensive workup performed, urine culture unremarkable urine analysis unremarkable. CT abdomen pelvis unremarkable other than perinephric edema but no evidence of UTI. X-ray chest unremarkable. Ultrasound abdomen unremarkable. ESR, CRP, Pro calcitonin level unremarkable. Follow blood cultures, continue to monitor.  Diet: Cardiac diet DVT Prophylaxis: subcutaneous Heparin  Advance goals of care discussion: full code  Family Communication: no family was present at bedside, at the time of interview.  Disposition:  Discharge to home.  Consultants: cardiology Procedures: Echocardiogram, stress test  Antibiotics: Anti-infectives    None       Objective: Physical Exam: Vitals:   10/19/16 0537 10/19/16 0600 10/19/16 0805 10/19/16 1342  BP: 123/87 (!) 152/92 (!) 143/83 136/74  Pulse: 93  87 82  Resp: '17 17 16 14  ' Temp: 98.7 F (37.1 C)  99.6 F (37.6 C) 100 F (37.8 C)  TempSrc: Oral  Oral Oral  SpO2: 97%  95% 95%  Weight: 66.1 kg (145 lb 12.8 oz)     Height:        Intake/Output Summary (Last 24 hours) at 10/19/16 1648 Last data filed at 10/19/16 1400  Gross per 24 hour  Intake          1139.25 ml  Output              725 ml  Net           414.25 ml   Filed Weights   10/17/16 0517 10/18/16 0315 10/19/16 0537  Weight: 64.9 kg (143 lb) 64.6 kg (142  lb 8 oz) 66.1 kg (145 lb 12.8 oz)   General: Alert, Awake and Oriented to Time, Place and Person. Appear in mild distress, affect appropriate Eyes: PERRL, Conjunctiva normal ENT: Oral Mucosa clear moist. Neck: no JVD, no Abnormal Mass Or lumps Cardiovascular: S1 and S2 Present, no  Murmur, Respiratory: Bilateral Air entry equal and Decreased, no use of accessory muscle, Clear to Auscultation, no Crackles, no wheezes Abdomen: Bowel Sound present, Soft and no tenderness Skin: no redness, no Rash, no induration Extremities: no Pedal edema, no calf tenderness Neurologic: Grossly no focal neuro deficit. Bilaterally Equal motor strength  Data Reviewed: CBC:  Recent Labs Lab 10/15/16 2329 10/17/16 0450 10/18/16 0331 10/19/16 0254  WBC 6.9 11.9* 10.3 11.9*  HGB 13.3 11.3* 11.1* 11.4*  HCT 39.3 34.0* 33.9* 34.5*  MCV 87.5 87.9 87.6 88.2  PLT 268 275 285 657   Basic Metabolic Panel:  Recent Labs Lab 10/16/16 0728 10/17/16 0450 10/18/16 0331 10/19/16 0254 10/19/16 1412  NA 134* 135 135 135 139  138  K 4.7 4.2 3.8 4.0 3.8  3.7  CL 103 104 104 103 108  107  CO2 21* '22 22 22 ' 21*  21*  GLUCOSE 257* 120* 137* 180* 144*  144*  BUN 19 25* 27* 41* 48*  48*  CREATININE 1.31* 1.56* 1.60* 1.91* 2.00*  2.02*  CALCIUM 8.8* 8.5* 8.6* 8.7* 8.8*  8.9    Liver Function Tests:  Recent Labs Lab 10/15/16 2329 10/16/16 0728 10/17/16 0450 10/19/16 0254  AST 48* 31 38 44*  ALT '27 20 24 31  ' ALKPHOS 62 59 57 55  BILITOT 1.4* 0.7 0.7 0.8  PROT 7.2 6.2* 6.1* 6.3*  ALBUMIN 3.7 3.2* 3.1* 3.0*    Recent Labs Lab 10/15/16 2329 10/19/16 0254  LIPASE 18 16   No results for input(s): AMMONIA in the last 168 hours. Coagulation Profile:  Recent Labs Lab 10/19/16 0254  INR 1.22   Cardiac Enzymes:  Recent Labs Lab 10/16/16 0217 10/16/16 0512 10/16/16 1014 10/16/16 1450  TROPONINI 0.69* 0.51* 0.55* 0.59*   BNP (last 3 results) No results for input(s): PROBNP in the last 8760 hours. CBG:  Recent Labs Lab 10/18/16 1547 10/18/16 2045 10/19/16 0752 10/19/16 1107 10/19/16 1639  GLUCAP 175* 150* 197* 177* 131*   Studies: No results found.  Scheduled Meds: . amLODipine  10 mg Oral Daily  . aspirin  81 mg Oral Daily  . carvedilol  6.25 mg Oral  BID WC  . enoxaparin (LOVENOX) injection  40 mg Subcutaneous Q24H  . hydrALAZINE  25 mg Oral Q8H  . insulin aspart  0-5 Units Subcutaneous QHS  . insulin aspart  0-9 Units Subcutaneous TID WC  . metoCLOPramide (REGLAN) injection  5 mg Intravenous Q6H  . pantoprazole  40 mg Oral BID  . polyethylene glycol  17 g Oral Daily  . senna-docusate  1 tablet Oral BID  . sodium chloride flush  3 mL Intravenous Q12H   Continuous Infusions: . sodium chloride    . sodium chloride 75 mL/hr at 10/19/16 0900  . sodium chloride Stopped (10/19/16 0900)   PRN Meds: sodium chloride, acetaminophen, albuterol, morphine injection, ondansetron (ZOFRAN) IV, sodium chloride flush, zolpidem  Time spent: 35 minutes  Author: Berle Mull, MD [facility] Pager: [phone] 10/19/2016 4:48 PM  If 7PM-7AM, please contact night-coverage at [email or web address], password [facility]

## 2016-10-19 NOTE — Clinical Social Work Note (Signed)
Clinical Social Work Assessment  Patient Details  Name: Shawn Collins MRN: [id number] Date of Birth: 16-Mar-1951  Date of referral:  10/19/16               Reason for consult:  Facility Placement, Care Management Concerns                Permission sought to share information with:  Family Supports Permission granted to share information::  No  Name::        Agency::     Relationship::     Contact Information:     Housing/Transportation Living arrangements for the past 2 months:  Single Family Home Source of Information:  Patient Patient Interpreter Needed:  None Criminal Activity/Legal Involvement Pertinent to Current Situation/Hospitalization:  No - Comment as needed Significant Relationships:  Siblings (sister) Lives with:  Self Do you feel safe going back to the place where you live?  No Need for family participation in patient care:  No (Coment)  Care giving concerns: Patient lives alone and PT recommends SNF. No family at bedside at time of assessment. Patient reports his sister has involvement in his care but has asked family not to visit him at hospital.   Social Worker assessment / plan: CSW met with patient at bedside. Patient reported understanding of PT recommendation for SNF and is amenable to plan for rehab. CSW will continue to follow and support with discharge needs.  Employment status:  Retired Forensic scientist:  Medicare PT Recommendations:  [place] / Referral to community resources:  [place]  Patient/Family's Response to care: Patient reported he was having stomach pain and stated he wants the nausea to resolve. Patient appeared in pain as evidenced by grimacing and remaining lying down during assessment. Patient was agreeable to discharge to SNF.   Patient/Family's Understanding of and Emotional Response to Diagnosis, Current Treatment, and Prognosis: Patient reported understanding of need for rehab and is  hopeful for recovered mobility at SNF. Patient reported understanding of discharge plan and CSW role.  Emotional Assessment Appearance:  Appears stated age Attitude/Demeanor/Rapport:  Other (appropriate) Affect (typically observed):  Appropriate, Calm Orientation:  Oriented to Self, Oriented to Place, Oriented to  Time, Oriented to Situation Alcohol / Substance use:  Not Applicable Psych involvement (Current and /or in the community):  No (Comment)  Discharge Needs  Concerns to be addressed:  Discharge Planning Concerns, Care Coordination Readmission within the last 30 days:  No Current discharge risk:  Dependent with Mobility, Lives alone Barriers to Discharge:  Continued Medical Work up   Estanislado Emms, LCSW 10/19/2016, 4:16 PM

## 2016-10-19 NOTE — Progress Notes (Signed)
Pt with low grade fever this am - 99.6, noted to be 100.0 at next check. Dr. Posey Pronto text paged and made aware, waiting return call. Also spoke with Barbaraann Rondo in cath lab to make him aware as patient is scheduled for cath this afternoon. No new orders received as of yet. All other VSS. Will continue to monitor closely.

## 2016-10-19 NOTE — Evaluation (Signed)
Physical Therapy Evaluation Patient Details Name: Shawn Collins MRN: [id number] DOB: Jan 01, 1951 Today's Date: 10/19/2016   History of Present Illness  66 y.o. male admitted to [facility] on 10/15/16 for N/V and abdominal discomfort.  Troponins were elevated, but cardiology consulted and did not think it was a STEMI, but possibly subendocardial ischemia.  He is supposed to go to the cath lab 10/19/16, but las a low grade fever and some increased kidney dysfunction.  Pt with significant PMHx of HTN, DM, COPD, and CKD stage III.  Clinical Impression  Pt is very weak and shaking at rest in the bed.  In sitting he reports some dizziness and it looks effortful to sit up with his head up.  He was able to stand assisted over weak legs and side step up in the bed.  I am hopeful he will feel better during his stay and be able to return home alone, but right now (given his presentation today) he is too weak to do so safely.  PT will follow acutely.     Follow Up Recommendations SNF    Equipment Recommendations  Rolling walker with 5" wheels    Recommendations for Other Services   NA    Precautions / Restrictions Precautions Precautions: Fall Precaution Comments: he is very weak and shaky on his feet.  Restrictions Weight Bearing Restrictions: No      Mobility  Bed Mobility Overal bed mobility: Modified Independent             General bed mobility comments: Pt able to get to EOB and back into bed unassisted.   Transfers Overall transfer level: Needs assistance Equipment used: 2 person hand held assist Transfers: Sit to/from Stand Sit to Stand: +2 physical assistance;Min assist         General transfer comment: Two person min hand held assist for balance when transitioning to stand.   Ambulation/Gait Ambulation/Gait assistance: +2 physical assistance;Min assist Ambulation Distance (Feet): 3 Feet Assistive device: 2 person hand held assist Gait Pattern/deviations: Step-to pattern      General Gait Details: Two person min hand held assist to help pt side step up closer to the [facility] to return to supine.  Pt is very weak and shaky on his feet and needed support to side step and stand safely.          Balance Overall balance assessment: Needs assistance Sitting-balance support: Feet supported;Bilateral upper extremity supported Sitting balance-Leahy Scale: Fair Sitting balance - Comments: Pt maintained flexed trunk posture looking down at the floor.  he was able to lift his head to look up at me, but it looked effortful.  Attempted to get him to track my finger trying to do a basic vestibular/occulomotor assessment, but he had difficulty with this (not sure he understood what I wanted him to do or because he was not able to do it).     Standing balance support: Bilateral upper extremity supported Standing balance-Leahy Scale: Poor                               Pertinent Vitals/Pain Pain Assessment: Faces Faces Pain Scale: Hurts little more Pain Location: generalized Pain Descriptors / Indicators: Grimacing;Guarding (restless, shaking) Pain Intervention(s): Limited activity within patient's tolerance;Monitored during session;Repositioned    Home Living Family/patient expects to be discharged to:: Private residence Living Arrangements: Alone Available Help at Discharge: Family;Available PRN/intermittently Type of Home: House Home Access: Stairs to enter Entrance Stairs-Rails:  Left Entrance Stairs-Number of Steps: 7 Home Layout: One level        Prior Function Level of Independence: Independent               Hand Dominance   Dominant Hand: Right    Extremity/Trunk Assessment   Upper Extremity Assessment Upper Extremity Assessment: Generalized weakness (slow and difficult to lift arms over his head, grip/elbow 4)    Lower Extremity Assessment Lower Extremity Assessment: Generalized weakness (4/5 grossly per EOB MMT throughout. )     Cervical / Trunk Assessment Cervical / Trunk Assessment: Normal  Communication   Communication: No difficulties  Cognition Arousal/Alertness: Awake/alert Behavior During Therapy: WFL for tasks assessed/performed Overall Cognitive Status:  (not specifically tested)                                 General Comments: General conversation was WNL, no family to confirm his answers on hx questions.       General Comments General comments (skin integrity, edema, etc.): Resting vital signs are stable.          Assessment/Plan    PT Assessment Patient needs continued PT services  PT Problem List Decreased strength;Decreased activity tolerance;Decreased mobility;Decreased balance;Decreased knowledge of use of DME;Pain;Cardiopulmonary status limiting activity       PT Treatment Interventions DME instruction;Gait training;Stair training;Functional mobility training;Therapeutic activities;Therapeutic exercise;Balance training;Patient/family education    PT Goals (Current goals can be found in the Care Plan section)  Acute Rehab PT Goals Patient Stated Goal: to feel better and not get nauseated when he eats PT Goal Formulation: With patient Time For Goal Achievement: 11/02/16 Potential to Achieve Goals: Good    Frequency Min 3X/week   Barriers to discharge Decreased caregiver support Pt lives alone and is too weak to go home by himself right now.        AM-PAC PT "6 Clicks" Daily Activity  Outcome Measure Difficulty turning over in bed (including adjusting bedclothes, sheets and blankets)?: Total Difficulty moving from lying on back to sitting on the side of the bed? : Total Difficulty sitting down on and standing up from a chair with arms (e.g., wheelchair, bedside commode, etc,.)?: Total Help needed moving to and from a bed to chair (including a wheelchair)?: A Little Help needed walking in hospital room?: A Lot Help needed climbing 3-5 steps with a railing? : A  Lot 6 Click Score: 10    End of Session   Activity Tolerance: Patient limited by fatigue;Other (comment) (limited by weakness. ) Patient left: in bed;with call bell/phone within reach Nurse Communication: Mobility status PT Visit Diagnosis: Difficulty in walking, not elsewhere classified (R26.2);Muscle weakness (generalized) (M62.81)    Time: 4496-7591 PT Time Calculation (min) (ACUTE ONLY): 39 min   Charges:        Wells Guiles B. Myrtle Haller, PT, DPT [phone]    PT Evaluation $PT Eval Moderate Complexity: 1 Procedure PT Treatments $Therapeutic Activity: 8-22 mins   10/19/2016, 3:26 PM

## 2016-10-19 NOTE — Progress Notes (Signed)
Progress Note  Patient Name: Shawn Collins Date of Encounter: 10/19/2016  Primary Cardiologist: Dr. Radford Pax  Subjective   Denies any chest pain or SOB  Inpatient Medications    Scheduled Meds: . amLODipine  10 mg Oral Daily  . aspirin  81 mg Oral Daily  . carvedilol  6.25 mg Oral BID WC  . enoxaparin (LOVENOX) injection  30 mg Subcutaneous Q24H  . hydrALAZINE  25 mg Oral Q8H  . insulin aspart  0-5 Units Subcutaneous QHS  . insulin aspart  0-9 Units Subcutaneous TID WC  . metoCLOPramide (REGLAN) injection  5 mg Intravenous Q6H  . pantoprazole  40 mg Oral BID  . polyethylene glycol  17 g Oral Daily  . senna-docusate  1 tablet Oral BID  . sodium chloride flush  3 mL Intravenous Q12H   Continuous Infusions: . sodium chloride    . sodium chloride 1 mL/kg/hr (10/18/16 2145)   PRN Meds: sodium chloride, acetaminophen, albuterol, morphine injection, ondansetron (ZOFRAN) IV, promethazine, sodium chloride flush, zolpidem   Vital Signs    Vitals:   10/18/16 2343 10/19/16 0537 10/19/16 0600 10/19/16 0805  BP: 140/76 123/87 (!) 152/92 (!) 143/83  Pulse: 81 93  87  Resp: 11 17 17 16   Temp: 98.6 F (37 C) 98.7 F (37.1 C)  99.6 F (37.6 C)  TempSrc: Oral Oral  Oral  SpO2: 99% 97%  95%  Weight:  145 lb 12.8 oz (66.1 kg)    Height:        Intake/Output Summary (Last 24 hours) at 10/19/16 0837 Last data filed at 10/19/16 0500  Gross per 24 hour  Intake                0 ml  Output              450 ml  Net             -450 ml   Filed Weights   10/17/16 0517 10/18/16 0315 10/19/16 0537  Weight: 143 lb (64.9 kg) 142 lb 8 oz (64.6 kg) 145 lb 12.8 oz (66.1 kg)    Telemetry    NSR - Personally Reviewed  ECG    NSR - Personally Reviewed  Physical Exam   GEN: No acute distress.   Neck: No JVD Cardiac: RRR, no murmurs, rubs, or gallops.  Respiratory: Clear to auscultation bilaterally. GI: Soft, nontender, non-distended  MS: No edema; No deformity. Neuro:  Nonfocal   Psych: Normal affect   Labs    Chemistry Recent Labs Lab 10/16/16 0728 10/17/16 0450 10/18/16 0331 10/19/16 0254  NA 134* 135 135 135  K 4.7 4.2 3.8 4.0  CL 103 104 104 103  CO2 21* 22 22 22   GLUCOSE 257* 120* 137* 180*  BUN 19 25* 27* 41*  CREATININE 1.31* 1.56* 1.60* 1.91*  CALCIUM 8.8* 8.5* 8.6* 8.7*  PROT 6.2* 6.1*  --  6.3*  ALBUMIN 3.2* 3.1*  --  3.0*  AST 31 38  --  44*  ALT 20 24  --  31  ALKPHOS 59 57  --  55  BILITOT 0.7 0.7  --  0.8  GFRNONAA 55* 44* 43* 35*  GFRAA >60 51* 50* 40*  ANIONGAP 10 9 9 10      Hematology Recent Labs Lab 10/17/16 0450 10/18/16 0331 10/19/16 0254  WBC 11.9* 10.3 11.9*  RBC 3.87* 3.87* 3.91*  HGB 11.3* 11.1* 11.4*  HCT 34.0* 33.9* 34.5*  MCV 87.9 87.6 88.2  MCH  29.2 28.7 29.2  MCHC 33.2 32.7 33.0  RDW 12.5 12.4 12.5  PLT 275 285 271    Cardiac Enzymes Recent Labs Lab 10/16/16 0217 10/16/16 0512 10/16/16 1014 10/16/16 1450  TROPONINI 0.69* 0.51* 0.55* 0.59*    Recent Labs Lab 10/15/16 2343  TROPIPOC 0.35*     BNPNo results for input(s): BNP, PROBNP in the last 168 hours.   DDimer No results for input(s): DDIMER in the last 168 hours.   Radiology    Nm Myocar Multi W/spect W/wall Motion / Ef  Result Date: 10/18/2016  There was no ST segment deviation noted during stress.  Defect 1: There is a large defect of moderate severity present in the basal inferoseptal, basal inferior, mid inferoseptal, mid inferior, apical septal and apical inferior location.  Defect 2: There is a large defect of severe severity present in the basal anterior, basal anteroseptal, mid anterior, mid anteroseptal, apical anterior, apical septal and apex location.  Findings consistent with prior myocardial infarction in the LAD and RCA regions with very mild peri-infarct ischemia in the apical inferior and apical septum.  This is an intermediate risk study due to reduced systolic function.  The left ventricular ejection fraction is  moderately decreased (30-44%).    US Abdomen Limited Ruq  Result Date: 10/18/2016 CLINICAL DATA:  65 year old diabetic hypertensive male with persistent nausea since 10/15/2016. Subsequent encounter. EXAM: US ABDOMEN LIMITED - RIGHT UPPER QUADRANT COMPARISON:  10/16/2016 CT. FINDINGS: Gallbladder: No gallstones or wall thickening visualized. No sonographic Murphy sign noted by sonographer. Common bile duct: Diameter: 7.7 mm. Distal aspect not visualized secondary to bowel gas Liver: Mild intrahepatic biliary duct dilation.  No focal hepatic lesion. IMPRESSION: Gallbladder unremarkable. Slight prominence of common bile duct (7.7 mm) and mild intrahepatic biliary duct dilation suspected. Correlation with liver function studies recommended. Electronically Signed   By: Genia Del M.D.   On: 10/18/2016 14:19    Cardiac Studies  Nuclear stress test 09/2016 Study Result    There was no ST segment deviation noted during stress.  Defect 1: There is a large defect of moderate severity present in the basal inferoseptal, basal inferior, mid inferoseptal, mid inferior, apical septal and apical inferior location.  Defect 2: There is a large defect of severe severity present in the basal anterior, basal anteroseptal, mid anterior, mid anteroseptal, apical anterior, apical septal and apex location.  Findings consistent with prior myocardial infarction in the LAD and RCA regions with very mild peri-infarct ischemia in the apical inferior and apical septum.  This is an intermediate risk study due to reduced systolic function.  The left ventricular ejection fraction is moderately decreased (30-44%).     Patient Profile     66 y.o. male with PMH significant for HTN, NIDDM, dyslipidemia, and smoking who presented to the ED with two days of malaise, fatigue, and persistent nausea with bouts of emesis. His ECG does not meet STEMI criteria but could represent subendocardial ischemia.   Assessment & Plan      1. Elevated troponin: Troponin is (0.51>0.55>0.59) Demand ischemia favored in the setting of hypertensive urgency. 2 D echo done today showed normal LVF with EF 60-65% with no wall motion abnormalities. -- continue 81mg  daily, BB and IV Heparin gtt. -- Lexiscan Myoview abnormal - Intermediate risk study with EF moderately decreased at 30-44% and evidence of scar in the  basal inferoseptal, basal inferior, mid inferoseptal, mid inferior, apical septal and apical inferior location with peri infarct ischemia in the apical inferior  and apical septum.   -- He is NPO for cath today pending creatinine repeat after IVF.   2. KZS:WFUXNAT for hypertensive emergency given elevated troponin and AKI --  Better control of BP but still borderline controlled. --  Continue amlodipine 10mg  daily.  --  Increase Coreg to 12.5mg  BID.  --  Continue Hydralazine 25mg  q8 hours  3. FTD:DUKGURKYHC per primary team. Creatinine up from 1.60 yesterday to 1.91 today.   - gently hydrate and repeat early this afternoon.  4.  DCM ? Ischemic with abnormal nuclear stress test.  EF 30-44%. -- continue coreg and hydralazine -- no ACE I or ARB due to AKI.    Signed, Fransico Him, MD  10/19/2016, 8:37 AM

## 2016-10-19 NOTE — NC FL2 (Signed)
[place] MEDICAID FL2 LEVEL OF CARE SCREENING TOOL     IDENTIFICATION  Patient Name: Shawn Collins Birthdate: 13-Feb-1951 Sex: male Admission Date (Current Location): 10/15/2016  [facility] and [place] Number:  Herbalist and Address:  [facility] [place], [place] [address], [place], [place] [address]      Provider Number: [id number]  Attending Physician Name and Address:  Lavina Hamman, MD  Relative Name and Phone Number:       Current Level of Care: Hospital Recommended Level of Care: [place] Prior Approval Number:    Date Approved/Denied:   PASRR Number: [id number]  Discharge Plan: SNF    Current Diagnoses: Patient Active Problem List   Diagnosis Date Noted  . Abnormal nuclear stress test   . Nausea & vomiting 10/16/2016  . NSTEMI (non-ST elevated myocardial infarction) ([place]) 10/16/2016  . CKD (chronic kidney disease), stage III 10/16/2016  . Hypertensive emergency 10/16/2016  . Elevated troponin   . COPD exacerbation ([place]) 09/10/2014  . COPD (chronic obstructive pulmonary disease) ([place]) 09/10/2014  . Viral gastroenteritis 09/10/2014  . Hypoxia 09/10/2014  . Dizzy-->?Orthostatic? 09/10/2014  . Diabetes mellitus without complication ([place]) 06/07/3233  . Hypertension 09/10/2014  . Emphysema lung ([place]) 09/10/2014    Orientation RESPIRATION BLADDER Height & Weight     Self, Time, Situation, Place  O2 (2L [place]) Continent Weight: 145 lb 12.8 oz (66.1 kg) Height:  5\' 10"  (177.8 cm)  BEHAVIORAL SYMPTOMS/MOOD NEUROLOGICAL BOWEL NUTRITION STATUS      Continent Diet  AMBULATORY STATUS COMMUNICATION OF NEEDS Skin   Extensive Assist Verbally Normal                       Personal Care Assistance Level of Assistance  Bathing, Dressing Bathing Assistance: Maximum assistance   Dressing Assistance: Maximum assistance     Functional Limitations Info             SPECIAL CARE FACTORS FREQUENCY  PT (By licensed PT), OT  (By licensed OT)     PT Frequency: 5/wk OT Frequency: 5/wk            Contractures      Additional Factors Info  Code Status, Allergies, Insulin Sliding Scale Code Status Info: FULL Allergies Info: NKA   Insulin Sliding Scale Info: 4/day       Current Medications (10/19/2016):  This is the current hospital active medication list Current Facility-Administered Medications  Medication Dose Route Frequency Provider Last Rate Last Dose  . 0.9 %  sodium chloride infusion  250 mL Intravenous PRN Hilty, Nadean Corwin, MD      . 0.9 %  sodium chloride infusion   Intravenous Continuous Sueanne Margarita, MD 75 mL/hr at 10/19/16 0900    . 0.9% sodium chloride infusion  1 mL/kg/hr Intravenous Continuous Hilty, Nadean Corwin, MD   Stopped at 10/19/16 0900  . acetaminophen (TYLENOL) tablet 650 mg  650 mg Oral Q6H PRN Ivor Costa, MD      . albuterol (PROVENTIL) (2.5 MG/3ML) 0.083% nebulizer solution 2.5 mg  2.5 mg Nebulization Q4H PRN Ivor Costa, MD      . amLODipine (NORVASC) tablet 10 mg  10 mg Oral Daily Ivor Costa, MD   10 mg at 10/19/16 0815  . aspirin chewable tablet 81 mg  81 mg Oral Daily Ivor Costa, MD   81 mg at 10/19/16 0815  . carvedilol (COREG) tablet 6.25 mg  6.25 mg Oral BID WC Turner,  Eber Hong, MD   6.25 mg at 10/19/16 0815  . enoxaparin (LOVENOX) injection 40 mg  40 mg Subcutaneous Q24H Berle Mull M, MD      . hydrALAZINE (APRESOLINE) tablet 25 mg  25 mg Oral Q8H Domenic Polite, MD   25 mg at 10/19/16 1401  . insulin aspart (novoLOG) injection 0-5 Units  0-5 Units Subcutaneous QHS Ivor Costa, MD      . insulin aspart (novoLOG) injection 0-9 Units  0-9 Units Subcutaneous TID WC Ivor Costa, MD   2 Units at 10/19/16 1142  . metoCLOPramide (REGLAN) injection 5 mg  5 mg Intravenous Q6H Lavina Hamman, MD   5 mg at 10/19/16 1143  . morphine 2 MG/ML injection 2 mg  2 mg Intravenous Q4H PRN Domenic Polite, MD      . ondansetron [facility]) injection 4 mg  4 mg Intravenous Q6H PRN Domenic Polite, MD   4 mg at 10/19/16 0548  . pantoprazole (PROTONIX) EC tablet 40 mg  40 mg Oral BID Domenic Polite, MD   40 mg at 10/19/16 0815  . polyethylene glycol (MIRALAX / GLYCOLAX) packet 17 g  17 g Oral Daily Lavina Hamman, MD   17 g at 10/19/16 0815  . promethazine (PHENERGAN) injection 12.5 mg  12.5 mg Intravenous Q6H PRN Domenic Polite, MD   12.5 mg at 10/18/16 5397  . senna-docusate (Senokot-S) tablet 1 tablet  1 tablet Oral BID Lavina Hamman, MD   1 tablet at 10/19/16 0815  . sodium chloride flush (NS) 0.9 % injection 3 mL  3 mL Intravenous Q12H Pixie Casino, MD   3 mL at 10/18/16 2141  . sodium chloride flush (NS) 0.9 % injection 3 mL  3 mL Intravenous PRN Hilty, Nadean Corwin, MD      . zolpidem (AMBIEN) tablet 5 mg  5 mg Oral QHS PRN Ivor Costa, MD         Discharge Medications: Please see discharge summary for a list of discharge medications.  Relevant Imaging Results:  Relevant Lab Results:   Additional Information SS#: [id number]  Jorge Ny, LCSW

## 2016-10-19 NOTE — Plan of Care (Signed)
Problem: Fluid Volume: Goal: Ability to maintain a balanced intake and output will improve Outcome: Progressing No emesis this shift. Pt receiving fluids per orders. Will continue to monitor pt. Hourly rounding performed.

## 2016-10-20 DIAGNOSIS — N179 Acute kidney failure, unspecified: Secondary | ICD-10-CM

## 2016-10-20 DIAGNOSIS — N189 Chronic kidney disease, unspecified: Secondary | ICD-10-CM

## 2016-10-20 LAB — CBC
HEMATOCRIT: 34.7 % — AB (ref 39.0–52.0)
HEMOGLOBIN: 11.4 g/dL — AB (ref 13.0–17.0)
MCH: 28.9 pg (ref 26.0–34.0)
MCHC: 32.9 g/dL (ref 30.0–36.0)
MCV: 87.8 fL (ref 78.0–100.0)
Platelets: 278 10*3/uL (ref 150–400)
RBC: 3.95 MIL/uL — ABNORMAL LOW (ref 4.22–5.81)
RDW: 12.5 % (ref 11.5–15.5)
WBC: 12.1 10*3/uL — AB (ref 4.0–10.5)

## 2016-10-20 LAB — RENAL FUNCTION PANEL
ANION GAP: 11 (ref 5–15)
Albumin: 3 g/dL — ABNORMAL LOW (ref 3.5–5.0)
BUN: 48 mg/dL — AB (ref 6–20)
CHLORIDE: 105 mmol/L (ref 101–111)
CO2: 19 mmol/L — ABNORMAL LOW (ref 22–32)
Calcium: 8.3 mg/dL — ABNORMAL LOW (ref 8.9–10.3)
Creatinine, Ser: 1.93 mg/dL — ABNORMAL HIGH (ref 0.61–1.24)
GFR calc Af Amer: 40 mL/min — ABNORMAL LOW (ref >60)
GFR, EST NON AFRICAN AMERICAN: 34 mL/min — AB (ref >60)
Glucose, Bld: 172 mg/dL — ABNORMAL HIGH (ref 65–99)
POTASSIUM: 3.8 mmol/L (ref 3.5–5.1)
Phosphorus: 3.6 mg/dL (ref 2.5–4.6)
Sodium: 135 mmol/L (ref 135–145)

## 2016-10-20 LAB — URINE CULTURE: Culture: NO GROWTH

## 2016-10-20 LAB — GLUCOSE, CAPILLARY
GLUCOSE-CAPILLARY: 178 mg/dL — AB (ref 65–99)
GLUCOSE-CAPILLARY: 198 mg/dL — AB (ref 65–99)
GLUCOSE-CAPILLARY: 202 mg/dL — AB (ref 65–99)
Glucose-Capillary: 182 mg/dL — ABNORMAL HIGH (ref 65–99)

## 2016-10-20 LAB — MAGNESIUM: MAGNESIUM: 2.2 mg/dL (ref 1.7–2.4)

## 2016-10-20 NOTE — Evaluation (Signed)
Physical Therapy Evaluation Patient Details Name: Shawn Collins MRN: [id number] DOB: 1950/11/16 Today's Date: 10/20/2016   History of Present Illness  66 y.o. male admitted to [facility] on 10/15/16 for N/V and abdominal discomfort.  Troponins were elevated, but cardiology consulted and did not think it was a STEMI, but possibly subendocardial ischemia.  He is supposed to go to the cath lab 10/19/16, but las a low grade fever and some increased kidney dysfunction.  Pt with significant PMHx of HTN, DM, COPD, and CKD stage III.  Clinical Impression  Pt progressing towards goals and tolerating increased ambulation this session. Continues to remain unsteady and required min to mod A for steadying during gait. Pt slightly orthostatic with mild symptoms which resolved with rest break. See vitals flowsheet. Continue to recommend SNF given current deficits. Will continue to follow to maximize functional mobility independence.     Follow Up Recommendations SNF    Equipment Recommendations  Rolling walker with 5" wheels    Recommendations for Other Services       Precautions / Restrictions Precautions Precautions: Fall Precaution Comments: Shakiness improved, however, continues to be very unsteady with ambulation.  Restrictions Weight Bearing Restrictions: No      Mobility  Bed Mobility Overal bed mobility: Modified Independent                Transfers Overall transfer level: Needs assistance Equipment used: 1 person hand held assist Transfers: Sit to/from Stand Sit to Stand: Min assist         General transfer comment: Min A for lift assist and steadying once standing. Pt demonstrating slight posterior lean in standing. Reporting some light headedness during standing. Demonstrating slight orthostatics. See vitals flowsheet.   Ambulation/Gait Ambulation/Gait assistance: Min assist;+2 safety/equipment;Mod assist Ambulation Distance (Feet): 100 Feet Assistive device: 1 person hand held  assist Gait Pattern/deviations: Step-through pattern;Decreased stride length;Drifts right/left;Staggering right;Shuffle Gait velocity: Decreased Gait velocity interpretation: Below normal speed for age/gender General Gait Details: Slow, unsteady gait requiring min to occaisional mod A for steadying without use of AD. Pt reporting some lightheadedness during gait, and required standing rest break. BP checked, however, no significant drop from standing BP.   Stairs            Wheelchair Mobility    Modified Rankin (Stroke Patients Only)       Balance Overall balance assessment: Needs assistance Sitting-balance support: Feet supported;Bilateral upper extremity supported Sitting balance-Leahy Scale: Fair Sitting balance - Comments: Pt with improved posture this session    Standing balance support: Single extremity supported;During functional activity Standing balance-Leahy Scale: Poor Standing balance comment: reliant on min to mod A for steadying and HHA during ambulation.                              Pertinent Vitals/Pain Pain Assessment: Faces Faces Pain Scale: Hurts a little bit Pain Location: generalized Pain Descriptors / Indicators: Grimacing;Guarding Pain Intervention(s): Limited activity within patient's tolerance;Monitored during session;Repositioned    Home Living                        Prior Function                 Hand Dominance        Extremity/Trunk Assessment                Communication      Cognition Arousal/Alertness: Awake/alert Behavior During  Therapy: Flat affect Overall Cognitive Status: No family/caregiver present to determine baseline cognitive functioning                                 General Comments: Pt with flat affect however, would respond to questions when asked.       General Comments General comments (skin integrity, edema, etc.): HR and oxygen sats stables. Slightly  orthostatic with mild symptoms. See vitals flowsheets.     Exercises General Exercises - Upper Extremity Shoulder Flexion: AROM;Both;10 reps;Seated General Exercises - Lower Extremity Long Arc Quad: AROM;Both;10 reps;Seated Hip Flexion/Marching: AROM;Both;10 reps;Seated Toe Raises: AROM;Both;10 reps;Seated Heel Raises: AROM;Both;10 reps;Seated   Assessment/Plan    PT Assessment    PT Problem List         PT Treatment Interventions      PT Goals (Current goals can be found in the Care Plan section)  Acute Rehab PT Goals Patient Stated Goal: to feel better and not get nauseated when he eats PT Goal Formulation: With patient Time For Goal Achievement: 11/02/16 Potential to Achieve Goals: Good    Frequency Min 3X/week   Barriers to discharge        Co-evaluation               AM-PAC PT "6 Clicks" Daily Activity  Outcome Measure Difficulty turning over in bed (including adjusting bedclothes, sheets and blankets)?: A Lot Difficulty moving from lying on back to sitting on the side of the bed? : A Lot Difficulty sitting down on and standing up from a chair with arms (e.g., wheelchair, bedside commode, etc,.)?: Total Help needed moving to and from a bed to chair (including a wheelchair)?: A Little Help needed walking in hospital room?: A Lot Help needed climbing 3-5 steps with a railing? : A Lot 6 Click Score: 12    End of Session Equipment Utilized During Treatment: Gait belt;Oxygen Activity Tolerance: Patient tolerated treatment well Patient left: in chair;with call bell/phone within reach;with chair alarm set Nurse Communication: Mobility status PT Visit Diagnosis: Difficulty in walking, not elsewhere classified (R26.2);Muscle weakness (generalized) (M62.81)    Time: 8182-9937 PT Time Calculation (min) (ACUTE ONLY): 25 min   Charges:     PT Treatments $Gait Training: 8-22 mins $Therapeutic Exercise: 8-22 mins   PT G Codes:        Nicky Pugh, PT,  DPT  Acute Rehabilitation Services  Pager: [phone]   Army Melia 10/20/2016, 3:25 PM

## 2016-10-20 NOTE — Care Management Note (Addendum)
Case Management Note  Patient Details  Name: Quavon Keisling MRN: [id number] Date of Birth: [birth year]  Subjective/Objective:  Pt presented for Chest Pain-Nstemi, N/V and abdominal pain. Plan for cardiac cath once Cr is decreased.              Action/Plan: PT recommendations for SNF. CSW assisting with disposition needs. CM will continue to monitor.   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 Plan was previously for SNF. CM will continue to monitor for disposition needs.   Bethena Roys, RN 10/20/2016, 3:57 PM

## 2016-10-20 NOTE — Progress Notes (Signed)
Progress Note  Patient Name: Shawn Collins Date of Encounter: 10/20/2016  Primary Cardiologist: Dr. Radford Pax  Subjective   Denies any chest pain, dyspnea or orthopnea.   Inpatient Medications    Scheduled Meds: . amLODipine  10 mg Oral Daily  . aspirin  81 mg Oral Daily  . carvedilol  6.25 mg Oral BID WC  . enoxaparin (LOVENOX) injection  40 mg Subcutaneous Q24H  . hydrALAZINE  25 mg Oral Q8H  . insulin aspart  0-5 Units Subcutaneous QHS  . insulin aspart  0-9 Units Subcutaneous TID WC  . metoCLOPramide (REGLAN) injection  5 mg Intravenous Q6H  . pantoprazole  40 mg Oral BID  . polyethylene glycol  17 g Oral Daily  . senna-docusate  1 tablet Oral BID  . sodium chloride flush  3 mL Intravenous Q12H   Continuous Infusions: . sodium chloride    . sodium chloride 75 mL/hr at 10/19/16 0900  . sodium chloride Stopped (10/19/16 0900)   PRN Meds: sodium chloride, acetaminophen, albuterol, morphine injection, ondansetron (ZOFRAN) IV, sodium chloride flush, zolpidem   Vital Signs    Vitals:   10/20/16 0500 10/20/16 0729 10/20/16 0755 10/20/16 0805  BP: (!) 152/83 (!) 147/83    Pulse: 88 91 88 85  Resp:  18    Temp: 99.5 F (37.5 C) 99.9 F (37.7 C)    TempSrc: Oral Oral    SpO2: 91% 92% 95%   Weight: 144 lb 3.2 oz (65.4 kg)     Height:        Intake/Output Summary (Last 24 hours) at 10/20/16 0927 Last data filed at 10/20/16 0734  Gross per 24 hour  Intake            292.5 ml  Output             1300 ml  Net          -1007.5 ml   Filed Weights   10/18/16 0315 10/19/16 0537 10/20/16 0500  Weight: 142 lb 8 oz (64.6 kg) 145 lb 12.8 oz (66.1 kg) 144 lb 3.2 oz (65.4 kg)    Telemetry    NSR int he 70's and 80's - Personally Reviewed  ECG    No new tracings  Physical Exam   GEN: No acute distress.   Neck: No JVD Cardiac: RRR, no murmurs, rubs, or gallops.  Respiratory: Clear to auscultation bilaterally. GI: Soft, nontender, non-distended  MS: No edema; No  deformity. Neuro:  Nonfocal  Psych: Normal affect   Labs    Chemistry Recent Labs Lab 10/16/16 0728 10/17/16 0450  10/19/16 0254 10/19/16 1412 10/20/16 0340  NA 134* 135  < > 135 139  138 135  K 4.7 4.2  < > 4.0 3.8  3.7 3.8  CL 103 104  < > 103 108  107 105  CO2 21* 22  < > 22 21*  21* 19*  GLUCOSE 257* 120*  < > 180* 144*  144* 172*  BUN 19 25*  < > 41* 48*  48* 48*  CREATININE 1.31* 1.56*  < > 1.91* 2.00*  2.02* 1.93*  CALCIUM 8.8* 8.5*  < > 8.7* 8.8*  8.9 8.3*  PROT 6.2* 6.1*  --  6.3*  --   --   ALBUMIN 3.2* 3.1*  --  3.0*  --  3.0*  AST 31 38  --  44*  --   --   ALT 20 24  --  31  --   --  ALKPHOS 59 57  --  55  --   --   BILITOT 0.7 0.7  --  0.8  --   --   GFRNONAA 55* 44*  < > 35* 33*  32* 34*  GFRAA >60 51*  < > 40* 38*  38* 40*  ANIONGAP 10 9  < > 10 10  10 11   < > = values in this interval not displayed.   Hematology  Recent Labs Lab 10/18/16 0331 10/19/16 0254 10/20/16 0340  WBC 10.3 11.9* 12.1*  RBC 3.87* 3.91* 3.95*  HGB 11.1* 11.4* 11.4*  HCT 33.9* 34.5* 34.7*  MCV 87.6 88.2 87.8  MCH 28.7 29.2 28.9  MCHC 32.7 33.0 32.9  RDW 12.4 12.5 12.5  PLT 285 271 278    Cardiac Enzymes  Recent Labs Lab 10/16/16 0217 10/16/16 0512 10/16/16 1014 10/16/16 1450  TROPONINI 0.69* 0.51* 0.55* 0.59*     Recent Labs Lab 10/15/16 2343  TROPIPOC 0.35*     BNPNo results for input(s): BNP, PROBNP in the last 168 hours.   DDimer No results for input(s): DDIMER in the last 168 hours.   Radiology    Nm Myocar Multi W/spect W/wall Motion / Ef  Result Date: 10/18/2016  There was no ST segment deviation noted during stress.  Defect 1: There is a large defect of moderate severity present in the basal inferoseptal, basal inferior, mid inferoseptal, mid inferior, apical septal and apical inferior location.  Defect 2: There is a large defect of severe severity present in the basal anterior, basal anteroseptal, mid anterior, mid anteroseptal,  apical anterior, apical septal and apex location.  Findings consistent with prior myocardial infarction in the LAD and RCA regions with very mild peri-infarct ischemia in the apical inferior and apical septum.  This is an intermediate risk study due to reduced systolic function.  The left ventricular ejection fraction is moderately decreased (30-44%).    US Abdomen Limited Ruq  Result Date: 10/18/2016 CLINICAL DATA:  66 year old diabetic hypertensive male with persistent nausea since 10/15/2016. Subsequent encounter. EXAM: US ABDOMEN LIMITED - RIGHT UPPER QUADRANT COMPARISON:  10/16/2016 CT. FINDINGS: Gallbladder: No gallstones or wall thickening visualized. No sonographic Murphy sign noted by sonographer. Common bile duct: Diameter: 7.7 mm. Distal aspect not visualized secondary to bowel gas Liver: Mild intrahepatic biliary duct dilation.  No focal hepatic lesion. IMPRESSION: Gallbladder unremarkable. Slight prominence of common bile duct (7.7 mm) and mild intrahepatic biliary duct dilation suspected. Correlation with liver function studies recommended. Electronically Signed   By: Genia Del M.D.   On: 10/18/2016 14:19    Cardiac Studies  Nuclear stress test 09/2016 Study Result    There was no ST segment deviation noted during stress.  Defect 1: There is a large defect of moderate severity present in the basal inferoseptal, basal inferior, mid inferoseptal, mid inferior, apical septal and apical inferior location.  Defect 2: There is a large defect of severe severity present in the basal anterior, basal anteroseptal, mid anterior, mid anteroseptal, apical anterior, apical septal and apex location.  Findings consistent with prior myocardial infarction in the LAD and RCA regions with very mild peri-infarct ischemia in the apical inferior and apical septum.  This is an intermediate risk study due to reduced systolic function.  The left ventricular ejection fraction is moderately decreased  (30-44%).     Patient Profile     66 y.o. male with PMH significant for HTN, NIDDM, dyslipidemia, and smoking who presented to the ED with two  days of malaise, fatigue, and persistent nausea with bouts of emesis. His ECG does not meet STEMI criteria but could represent subendocardial ischemia.   Assessment & Plan    1. Elevated troponin: Troponin is (0.51>0.55>0.59) Demand ischemia favored in the setting of hypertensive urgency. 2 D echo done today showed normal LVF with EF 60-65% with no wall motion abnormalities. -- continue 81mg  daily, BB and IV Heparin gtt. -- Lexiscan Myoview abnormal - Intermediate risk study with EF moderately decreased at 30-44% and evidence of scar in the  basal inferoseptal, basal inferior, mid inferoseptal, mid inferior, apical septal and apical inferior location with peri infarct ischemia in the apical inferior and apical septum.   --  Will hold off on cath today to allow for further recovery of renal function, SCr 1.93. NPO after MN and reassess renal function in am   2. KTG:YBWLSLH for hypertensive emergency given elevated troponin and AKI --  Better control of BP but still borderline controlled. --  Continue amlodipine 10mg  daily.  --  Increased Coreg to 12.5mg  BID.  --  Continue Hydralazine 25mg  q8 hours  3. TDS:KAJGOTLXBW per primary team. SCr still elevated 1.56--->max 2.02 --> 1.93 today. - gently hydrate and reassess in am prior to possible cath  4.  DCM ? Ischemic with abnormal nuclear stress test.  EF 30-44%. -- continue coreg and hydralazine -- no ACE I or ARB due to AKI.    Signed, Daune Perch, NP  10/20/2016, 9:27 AM

## 2016-10-20 NOTE — Progress Notes (Signed)
CSW provided patient with SNF bed offers. Patient reported no preferences for facility at this time. Patient reported his sister lives out of state and he would prefer to make decision about facility independently. CSW will continue to follow and support in discharge planning.  Shawn Collins, [place]

## 2016-10-20 NOTE — Care Management Important Message (Signed)
Important Message  Patient Details  Name: Shawn Collins MRN: [id number] Date of Birth: 09-16-1950   Medicare Important Message Given:  Yes    Nathen May 10/20/2016, 10:25 AM

## 2016-10-20 NOTE — Plan of Care (Signed)
Problem: Pain Managment: Goal: General experience of comfort will improve Outcome: Progressing Pt has had no complaints of pain, nausea or vomiting. Will continue to monitor this shift.

## 2016-10-20 NOTE — Progress Notes (Signed)
[facility] Progress Note  Patient: Shawn Collins BBC:[id number]   PCP: Nolene Ebbs, MD DOB: 1950/06/25   DOA: 10/15/2016   DOS: 10/20/2016   Date of Service: the patient was seen and examined on 10/20/2016  Subjective: feeling better, no shortnes of breath, no nausea or vomiting, no further chills. No burning urination, no diarrhea.  Brief hospital course: Pt. with PMH of  type II DM, HTN, dyslipidemia; admitted on 10/15/2016, presented with complaint of nausea vomiting, was found to have viral gastroenteritis and NSTEMI. Currently further plan is follow cardiac cath results.  Assessment and Plan: 1. Nausea vomiting and abdominal pain. Likely viral gastroenteritis. Possibility of gastroparesis CT abdomen pelvis unremarkable, x-ray abdomen unremarkable. Right upper quadrant ultrasound shows Dilated CBD which is unchanged from prior ultrasound. Continue supportive measures, placing on scheduled Reglan. Stool softener.  2. Non-STEMI. Abnormal stress test. Troponins are trending downwards. Could be contributing to patient's nausea and vomiting. Cardiology consulted, plan is to perform cardiac catheterization although serum creatinine worsening prior to cath, now stable.  Cardiology would like to continue IV hydration. And plan cath tomorrow. NPO after midnight.  Continue on amlodipine, carvedilol, no indication for diuretics at present. Echo 60-65% EF.  3. Acute on chronic kidney disease stage IV. Renal function progressively worsening. Now stable. Due to poor by mouth intake as well as continues use of lisinopril.  Avoid any further nephrotoxic medication, continue IV hydration. Patient is going to receive contrast for cardiac catheterization which may worsen renal function. CT scan does not show any obstruction but does show perinephric edema which is unchanged from prior years. UA x2 bland.  4. Type II DM.  Uncontrolled. Hyperglycemia. On Amaryl at home, hemoglobin A1c  10.1. Continue sliding scale insulin, Oral hypoglycemic agents as well as long-acting insulin is currently on hold due to poor by mouth intake. diabetic gastroparesis causing nausea and vomiting cannot be ruled out.  5. Fever with chills. Extensive workup performed, urine culture unremarkable  urine analysis unremarkable.  CT abdomen pelvis unremarkable other than perinephric edema but no evidence of UTI. X-ray chest unremarkable. Ultrasound abdomen unremarkable. ESR, CRP, Pro calcitonin level unremarkable. Follow blood cultures, continue to monitor.  Diet: cardiac and carb modified diet DVT Prophylaxis: subcutaneous Heparin  Advance goals of care discussion: full code  Family Communication: no family was present at bedside, at the time of interview.   Disposition:  Discharge to [facility] 10/22/2016 depending on stabilization of renal function.  Consultants: cardiology Procedures: Echocardiogram, NM stress test  Antibiotics: Anti-infectives    None       Objective: Physical Exam: Vitals:   10/20/16 0729 10/20/16 0755 10/20/16 0805 10/20/16 1110  BP: (!) 147/83   115/61  Pulse: 91 88 85 81  Resp: 18   16  Temp: 99.9 F (37.7 C)   99.3 F (37.4 C)  TempSrc: Oral   Oral  SpO2: 92% 95%  96%  Weight:      Height:        Intake/Output Summary (Last 24 hours) at 10/20/16 1152 Last data filed at 10/20/16 0823  Gross per 24 hour  Intake            532.5 ml  Output             1300 ml  Net           -767.5 ml   Filed Weights   10/18/16 0315 10/19/16 0537 10/20/16 0500  Weight: 64.6 kg (142 lb 8 oz) 66.1 kg (145  lb 12.8 oz) 65.4 kg (144 lb 3.2 oz)   General: Alert, Awake and Oriented to Time, Place and Person. Appear in mild distress, affect appropriate Eyes: PERRL, Conjunctiva normal ENT: Oral Mucosa clear moist. Neck: difficult to assess JVD, no Abnormal Mass Or lumps Cardiovascular: S1 and S2 Present, no Murmur, Respiratory: Bilateral Air entry equal and Decreased,  no use of accessory muscle, Clear to Auscultation, no Crackles, no wheezes Abdomen: Bowel Sound present, Soft and no tenderness Skin: no redness, no Rash, no induration Extremities: no Pedal edema, no calf tenderness Neurologic: Grossly no focal neuro deficit. Bilaterally Equal motor strength  Data Reviewed: CBC:  Recent Labs Lab 10/15/16 2329 10/17/16 0450 10/18/16 0331 10/19/16 0254 10/20/16 0340  WBC 6.9 11.9* 10.3 11.9* 12.1*  HGB 13.3 11.3* 11.1* 11.4* 11.4*  HCT 39.3 34.0* 33.9* 34.5* 34.7*  MCV 87.5 87.9 87.6 88.2 87.8  PLT 268 275 285 271 786   Basic Metabolic Panel:  Recent Labs Lab 10/17/16 0450 10/18/16 0331 10/19/16 0254 10/19/16 1412 10/20/16 0340  NA 135 135 135 139  138 135  K 4.2 3.8 4.0 3.8  3.7 3.8  CL 104 104 103 108  107 105  CO2 '22 22 22 ' 21*  21* 19*  GLUCOSE 120* 137* 180* 144*  144* 172*  BUN 25* 27* 41* 48*  48* 48*  CREATININE 1.56* 1.60* 1.91* 2.00*  2.02* 1.93*  CALCIUM 8.5* 8.6* 8.7* 8.8*  8.9 8.3*  MG  --   --   --   --  2.2  PHOS  --   --   --   --  3.6    Liver Function Tests:  Recent Labs Lab 10/15/16 2329 10/16/16 0728 10/17/16 0450 10/19/16 0254 10/20/16 0340  AST 48* 31 38 44*  --   ALT '27 20 24 31  ' --   ALKPHOS 62 59 57 55  --   BILITOT 1.4* 0.7 0.7 0.8  --   PROT 7.2 6.2* 6.1* 6.3*  --   ALBUMIN 3.7 3.2* 3.1* 3.0* 3.0*    Recent Labs Lab 10/15/16 2329 10/19/16 0254  LIPASE 18 16   No results for input(s): AMMONIA in the last 168 hours. Coagulation Profile:  Recent Labs Lab 10/19/16 0254  INR 1.22   Cardiac Enzymes:  Recent Labs Lab 10/16/16 0217 10/16/16 0512 10/16/16 1014 10/16/16 1450  TROPONINI 0.69* 0.51* 0.55* 0.59*   BNP (last 3 results) No results for input(s): PROBNP in the last 8760 hours. CBG:  Recent Labs Lab 10/19/16 1107 10/19/16 1639 10/19/16 2051 10/20/16 0731 10/20/16 1109  GLUCAP 177* 131* 176* 178* 198*   Studies: No results found.  Scheduled Meds: .  amLODipine  10 mg Oral Daily  . aspirin  81 mg Oral Daily  . carvedilol  6.25 mg Oral BID WC  . enoxaparin (LOVENOX) injection  40 mg Subcutaneous Q24H  . hydrALAZINE  25 mg Oral Q8H  . insulin aspart  0-5 Units Subcutaneous QHS  . insulin aspart  0-9 Units Subcutaneous TID WC  . metoCLOPramide (REGLAN) injection  5 mg Intravenous Q6H  . pantoprazole  40 mg Oral BID  . polyethylene glycol  17 g Oral Daily  . senna-docusate  1 tablet Oral BID  . sodium chloride flush  3 mL Intravenous Q12H   Continuous Infusions: . sodium chloride    . sodium chloride 75 mL/hr at 10/19/16 0900  . sodium chloride Stopped (10/19/16 0900)   PRN Meds: sodium chloride, acetaminophen, albuterol, morphine injection, ondansetron (  ZOFRAN) IV, sodium chloride flush, zolpidem  Time spent: 30 minutes  Author: Berle Mull, MD [facility] Pager: [phone] 10/20/2016 11:52 AM  If 7PM-7AM, please contact night-coverage at [email or web address], password [facility]

## 2016-10-21 ENCOUNTER — Other Ambulatory Visit: Payer: Self-pay | Admitting: *Deleted

## 2016-10-21 ENCOUNTER — Encounter (HOSPITAL_COMMUNITY)
Admission: EM | Disposition: A | Discharge status: Skilled Nursing Facility | Payer: Self-pay | Source: Home / Self Care | Attending: Cardiothoracic Surgery

## 2016-10-21 DIAGNOSIS — I2510 Atherosclerotic heart disease of native coronary artery without angina pectoris: Secondary | ICD-10-CM

## 2016-10-21 DIAGNOSIS — N183 Chronic kidney disease, stage 3 (moderate): Secondary | ICD-10-CM

## 2016-10-21 DIAGNOSIS — I214 Non-ST elevation (NSTEMI) myocardial infarction: Secondary | ICD-10-CM

## 2016-10-21 DIAGNOSIS — I25110 Atherosclerotic heart disease of native coronary artery with unstable angina pectoris: Secondary | ICD-10-CM

## 2016-10-21 HISTORY — PX: LEFT HEART CATH AND CORONARY ANGIOGRAPHY: CATH118249

## 2016-10-21 LAB — GLUCOSE, CAPILLARY
GLUCOSE-CAPILLARY: 156 mg/dL — AB (ref 65–99)
GLUCOSE-CAPILLARY: 195 mg/dL — AB (ref 65–99)
Glucose-Capillary: 163 mg/dL — ABNORMAL HIGH (ref 65–99)
Glucose-Capillary: 159 mg/dL — ABNORMAL HIGH (ref 65–99)

## 2016-10-21 LAB — RENAL FUNCTION PANEL
ALBUMIN: 2.5 g/dL — AB (ref 3.5–5.0)
Anion gap: 8 (ref 5–15)
BUN: 52 mg/dL — AB (ref 6–20)
CALCIUM: 8 mg/dL — AB (ref 8.9–10.3)
CO2: 20 mmol/L — ABNORMAL LOW (ref 22–32)
Chloride: 104 mmol/L (ref 101–111)
Creatinine, Ser: 2.05 mg/dL — ABNORMAL HIGH (ref 0.61–1.24)
GFR calc Af Amer: 37 mL/min — ABNORMAL LOW (ref >60)
GFR calc non Af Amer: 32 mL/min — ABNORMAL LOW (ref >60)
GLUCOSE: 172 mg/dL — AB (ref 65–99)
PHOSPHORUS: 2.4 mg/dL — AB (ref 2.5–4.6)
POTASSIUM: 3.6 mmol/L (ref 3.5–5.1)
SODIUM: 132 mmol/L — AB (ref 135–145)

## 2016-10-21 LAB — CBC
HCT: 34.2 % — ABNORMAL LOW (ref 39.0–52.0)
Hemoglobin: 11.5 g/dL — ABNORMAL LOW (ref 13.0–17.0)
MCH: 29.2 pg (ref 26.0–34.0)
MCHC: 33.6 g/dL (ref 30.0–36.0)
MCV: 86.8 fL (ref 78.0–100.0)
PLATELETS: 253 10*3/uL (ref 150–400)
RBC: 3.94 MIL/uL — ABNORMAL LOW (ref 4.22–5.81)
RDW: 12.3 % (ref 11.5–15.5)
WBC: 9.8 10*3/uL (ref 4.0–10.5)

## 2016-10-21 LAB — PROCALCITONIN: PROCALCITONIN: 0.27 ng/mL

## 2016-10-21 SURGERY — LEFT HEART CATH AND CORONARY ANGIOGRAPHY
Anesthesia: LOCAL

## 2016-10-21 MED ORDER — HEPARIN (PORCINE) IN NACL 2-0.9 UNIT/ML-% IJ SOLN
INTRAMUSCULAR | Status: AC | PRN
  Administered 2016-10-21: 1000 mL via INTRA_ARTERIAL

## 2016-10-21 MED ORDER — SODIUM CHLORIDE 0.9% FLUSH: 3.0000 mL | INTRAVENOUS | Status: DC | PRN

## 2016-10-21 MED ORDER — SODIUM CHLORIDE 0.9% FLUSH: 3.0000 mL | Freq: Two times a day (BID) | INTRAVENOUS | Status: DC

## 2016-10-21 MED ORDER — SODIUM CHLORIDE 0.9% FLUSH
3.0000 mL | Freq: Two times a day (BID) | INTRAVENOUS | Status: DC
  Administered 2016-10-22 – 2016-10-25 (×6): 3 mL via INTRAVENOUS

## 2016-10-21 MED ORDER — MIDAZOLAM HCL 2 MG/2ML IJ SOLN
INTRAMUSCULAR | Status: DC | PRN
Start: 2016-10-21 — End: 2016-10-21
  Administered 2016-10-21: 1 mg via INTRAVENOUS

## 2016-10-21 MED ORDER — SODIUM CHLORIDE 0.9 % IV SOLN
250.0000 mL | INTRAVENOUS | Status: DC | PRN
  Administered 2016-10-23: 5 mL via INTRAVENOUS

## 2016-10-21 MED ORDER — SODIUM CHLORIDE 0.9 % WEIGHT BASED INFUSION: 3.0000 mL/kg/h | INTRAVENOUS | Status: DC

## 2016-10-21 MED ORDER — HEPARIN SODIUM (PORCINE) 1000 UNIT/ML IJ SOLN
INTRAMUSCULAR | Status: DC | PRN
  Administered 2016-10-21: 3500 [IU] via INTRAVENOUS

## 2016-10-21 MED ORDER — MIDAZOLAM HCL 2 MG/2ML IJ SOLN
INTRAMUSCULAR | Status: AC
  Filled 2016-10-21: qty 2

## 2016-10-21 MED ORDER — LIDOCAINE HCL (PF) 1 % IJ SOLN
INTRAMUSCULAR | Status: DC | PRN
Start: 2016-10-21 — End: 2016-10-21
  Administered 2016-10-21: 2 mL

## 2016-10-21 MED ORDER — IOPAMIDOL (ISOVUE-370) INJECTION 76%
INTRAVENOUS | Status: DC | PRN
  Administered 2016-10-21: 34 mL via INTRA_ARTERIAL

## 2016-10-21 MED ORDER — HEPARIN (PORCINE) IN NACL 100-0.45 UNIT/ML-% IJ SOLN
750.0000 [IU]/h | INTRAMUSCULAR | Status: DC
  Administered 2016-10-22: 900 [IU]/h via INTRAVENOUS
  Administered 2016-10-24: 750 [IU]/h via INTRAVENOUS
  Filled 2016-10-21 (×4): qty 250

## 2016-10-21 MED ORDER — SODIUM CHLORIDE 0.9 % WEIGHT BASED INFUSION
1.0000 mL/kg/h | INTRAVENOUS | Status: AC
  Administered 2016-10-21: 1 mL/kg/h via INTRAVENOUS

## 2016-10-21 MED ORDER — HEPARIN SODIUM (PORCINE) 1000 UNIT/ML IJ SOLN
INTRAMUSCULAR | Status: AC
  Filled 2016-10-21: qty 1

## 2016-10-21 MED ORDER — ASPIRIN 81 MG PO CHEW
243.0000 mg | CHEWABLE_TABLET | ORAL | Status: AC
  Administered 2016-10-21: 243 mg via ORAL
  Filled 2016-10-21: qty 3

## 2016-10-21 MED ORDER — LIDOCAINE HCL 1 % IJ SOLN
INTRAMUSCULAR | Status: AC
  Filled 2016-10-21: qty 20

## 2016-10-21 MED ORDER — IOPAMIDOL (ISOVUE-370) INJECTION 76%
INTRAVENOUS | Status: AC
  Filled 2016-10-21: qty 50

## 2016-10-21 MED ORDER — SODIUM CHLORIDE 0.9 % WEIGHT BASED INFUSION: 1.0000 mL/kg/h | INTRAVENOUS | Status: DC

## 2016-10-21 MED ORDER — SODIUM CHLORIDE 0.9 % IV SOLN: 250.0000 mL | INTRAVENOUS | Status: DC | PRN

## 2016-10-21 MED ORDER — FENTANYL CITRATE (PF) 100 MCG/2ML IJ SOLN
INTRAMUSCULAR | Status: AC
  Filled 2016-10-21: qty 2

## 2016-10-21 MED ORDER — HEPARIN (PORCINE) IN NACL 2-0.9 UNIT/ML-% IJ SOLN
INTRAMUSCULAR | Status: AC
  Filled 2016-10-21: qty 1000

## 2016-10-21 MED ORDER — VERAPAMIL HCL 2.5 MG/ML IV SOLN
INTRAVENOUS | Status: AC
  Filled 2016-10-21: qty 2

## 2016-10-21 MED ORDER — FENTANYL CITRATE (PF) 100 MCG/2ML IJ SOLN
INTRAMUSCULAR | Status: DC | PRN
Start: 2016-10-21 — End: 2016-10-21
  Administered 2016-10-21: 25 ug via INTRAVENOUS

## 2016-10-21 SURGICAL SUPPLY — 10 items
CATH INFINITI 5 FR JL3.5 (CATHETERS) ×2 IMPLANT
CATH INFINITI JR4 5F (CATHETERS) ×2 IMPLANT
DEVICE RAD COMP TR BAND LRG (VASCULAR PRODUCTS) ×1 IMPLANT
GLIDESHEATH SLEND SS 6F .021 (SHEATH) ×2 IMPLANT
GUIDEWIRE INQWIRE 1.5J.035X260 (WIRE) IMPLANT
INQWIRE 1.5J .035X260CM (WIRE) ×2
KIT HEART LEFT (KITS) ×2 IMPLANT
PACK CARDIAC CATHETERIZATION (CUSTOM PROCEDURE TRAY) ×2 IMPLANT
TRANSDUCER W/STOPCOCK (MISCELLANEOUS) ×2 IMPLANT
TUBING CIL FLEX 10 FLL-RA (TUBING) ×2 IMPLANT

## 2016-10-21 NOTE — Consult Note (Signed)
[address]       [place] [address]             [phone]        Rasul Kimbrell [facility] Medical Record #[id number] Date of Birth: [birth year]  Referring: No ref. provider found Primary Care: Nolene Ebbs, MD  Chief Complaint:    Chief Complaint  Patient presents with  . Nausea  . Emesis  . Weakness    History of Present Illness:    Patient is a 66 year old male with multiple cardiac risk factors who presents to the emergency department with a 2 day history of malaise, fatigue and persistent nausea with bouts of emesis. He was admitted one week ago , under went cardiac cath late yesterday. He was found to have significant hypertension with systolic blood pressure greater than 220 mmHg. There were some ischemic changes on EKG concerning for ischemia and elevated troponin prompting cardiology consultation and admission. An echocardiogram showed LV function to be in the normal range with an ejection fraction of 60-65% and no wall motion abnormalities. Nuclear stress testing showed 2 large, mostly fixed defects with reduced LV function suggestive of scar. Asked by Dr Lonia Blood  to see in consideration for surgical coronary artery revascularization. history of insulin dependent dm x 20 years  Patient has known chronic kidney disease with episodes of acute renal failure , not no dialysis.  Lives alone    Current Activity/ Functional Status: Patient is independent with mobility/ambulation, transfers, ADL's, IADL's.   Zubrod Score: At the time of surgery this patient's most appropriate activity status/level should be described as: []     0    Normal activity, no symptoms [x]     1    Restricted in physical strenuous activity but ambulatory, able to do out light work []     2    Ambulatory and capable of self care, unable to do work activities, up and about                 more than 50%  Of the time                            []     3    Only limited self care, in bed  greater than 50% of waking hours []     4    Completely disabled, no self care, confined to bed or chair []     5    Moribund  Past Medical History:  Diagnosis Date  . Diabetes mellitus without complication ([place])   . Emphysema lung ([place])   . Hyperlipidemia   . Hypertension     History reviewed. No pertinent surgical history.  History  Smoking Status  . Former Smoker  . Packs/day: 0.25  . Years: 8.00  . Types: Cigarettes  . Quit date: 1972  Smokeless Tobacco  . Never Used    Patient Active Problem List   Diagnosis Date Noted  . Acute kidney injury superimposed on CKD ([place])   . Abnormal nuclear stress test   . Nausea & vomiting 10/16/2016  . NSTEMI (non-ST elevated myocardial infarction) ([place]) 10/16/2016  . CKD (chronic kidney disease), stage III 10/16/2016  . Hypertensive emergency 10/16/2016  . Elevated troponin   . COPD exacerbation ([place]) 09/10/2014  . COPD (chronic obstructive pulmonary disease) ([place]) 09/10/2014  . Viral gastroenteritis 09/10/2014  . Hypoxia 09/10/2014  . Dizzy-->?Orthostatic? 09/10/2014  . Diabetes mellitus  without complication ([place]) 16/02/9603  . Hypertension 09/10/2014  . Emphysema lung ([place]) 09/10/2014      History  Alcohol Use No      No Known Allergies  Current Facility-Administered Medications  Medication Dose Route Frequency Provider Last Rate Last Dose  . 0.9 %  sodium chloride infusion   Intravenous Continuous Lavina Hamman, MD 100 mL/hr at 10/21/16 1115    . acetaminophen (TYLENOL) tablet 650 mg  650 mg Oral Q6H PRN Ivor Costa, MD      . albuterol (PROVENTIL) (2.5 MG/3ML) 0.083% nebulizer solution 2.5 mg  2.5 mg Nebulization Q4H PRN Ivor Costa, MD      . amLODipine (NORVASC) tablet 10 mg  10 mg Oral Daily Ivor Costa, MD   10 mg at 10/21/16 0813  . aspirin chewable tablet 81 mg  81 mg Oral Daily Ivor Costa, MD   81 mg at 10/21/16 0813  . carvedilol (COREG) tablet 6.25 mg  6.25 mg Oral BID WC Fransico Him R, MD   6.25 mg at  10/21/16 0813  . enoxaparin (LOVENOX) injection 40 mg  40 mg Subcutaneous Q24H Lavina Hamman, MD   40 mg at 10/20/16 1222  . hydrALAZINE (APRESOLINE) tablet 25 mg  25 mg Oral Q8H Domenic Polite, MD   25 mg at 10/21/16 1437  . insulin aspart (novoLOG) injection 0-5 Units  0-5 Units Subcutaneous QHS Ivor Costa, MD      . insulin aspart (novoLOG) injection 0-9 Units  0-9 Units Subcutaneous TID WC Ivor Costa, MD   2 Units at 10/21/16 1138  . morphine 2 MG/ML injection 2 mg  2 mg Intravenous Q4H PRN Domenic Polite, MD      . ondansetron [facility]) injection 4 mg  4 mg Intravenous Q6H PRN Domenic Polite, MD   4 mg at 10/19/16 0548  . pantoprazole (PROTONIX) EC tablet 40 mg  40 mg Oral BID Domenic Polite, MD   40 mg at 10/21/16 0813  . polyethylene glycol (MIRALAX / GLYCOLAX) packet 17 g  17 g Oral Daily Lavina Hamman, MD   17 g at 10/21/16 0813  . senna-docusate (Senokot-S) tablet 1 tablet  1 tablet Oral BID Lavina Hamman, MD   1 tablet at 10/21/16 0813  . zolpidem (AMBIEN) tablet 5 mg  5 mg Oral QHS PRN Ivor Costa, MD        Prescriptions Prior to Admission  Medication Sig Dispense Refill Last Dose  . glimepiride (AMARYL) 4 MG tablet Take 4 mg by mouth daily with breakfast.   0 10/15/2016 at Unknown time  . Insulin Glargine (TOUJEO SOLOSTAR) 300 UNIT/ML SOPN Inject 20 Units into the skin at bedtime.     Marland Kitchen lisinopril (PRINIVIL,ZESTRIL) 20 MG tablet Take 20 mg by mouth daily.   10/15/2016 at Unknown time  . amLODipine (NORVASC) 10 MG tablet Take 1 tablet (10 mg total) by mouth daily. (Patient not taking: Reported on 10/16/2016) 30 tablet 0 Not Taking at Unknown time  . diphenhydrAMINE (BENADRYL) 25 MG tablet Take 1 tablet (25 mg total) by mouth every 6 (six) hours. (Patient not taking: Reported on 09/10/2014) 20 tablet 0 Not Taking at Unknown time  . hydrocortisone cream 1 % Apply to affected area 2 times daily (Patient not taking: Reported on 09/10/2014) 15 g 0 Not Taking at Unknown time  .  promethazine (PHENERGAN) 12.5 MG tablet Take 1 tablet (12.5 mg total) by mouth every 6 (six) hours as needed for nausea. (Patient not taking: Reported  on 10/16/2016) 30 tablet 0 Completed Course at Unknown time    Family History  Problem Relation Age of Onset  . Diabetes Mellitus II Mother   . Stroke Father   . Hypertension Father   . Diabetes Mellitus II Sister      Review of Systems:  Review of Systems  Constitutional: Positive for malaise/fatigue. Negative for chills, diaphoresis, fever and weight loss.  HENT: Negative for congestion, ear discharge, ear pain, hearing loss, nosebleeds, sinus pain and tinnitus.   Eyes: Negative for blurred vision, double vision, photophobia, pain, discharge and redness.  Respiratory: Positive for sputum production. Negative for cough, hemoptysis, shortness of breath, wheezing and stridor.   Cardiovascular: Positive for chest pain. Negative for palpitations, orthopnea, claudication, leg swelling and PND.  Gastrointestinal: Positive for nausea. Negative for abdominal pain, blood in stool, constipation, diarrhea, heartburn, melena and vomiting.  Genitourinary: Negative for dysuria, flank pain, frequency, hematuria and urgency.  Musculoskeletal: Negative for back pain, falls, joint pain, myalgias and neck pain.  Skin: Negative for itching and rash.  Neurological: Positive for tremors and weakness. Negative for dizziness, tingling, sensory change, speech change, focal weakness, seizures, loss of consciousness and headaches.  Endo/Heme/Allergies: Positive for environmental allergies and polydipsia. Bruises/bleeds easily.  Psychiatric/Behavioral: Positive for memory loss. Negative for depression, hallucinations, substance abuse and suicidal ideas. The patient is nervous/anxious. The patient does not have insomnia.    Physical Exam: BP 122/75   Pulse (!) 0   Temp 98.1 F (36.7 C)   Resp (!) 0   Ht 5\' 10"  (1.778 m)   Wt 147 lb 8 oz (66.9 kg)   SpO2 (!) 0%    BMI 21.16 kg/m  Physical Exam  Constitutional: He is oriented to person, place, and time. He appears well-developed and well-nourished. No distress.  HENT:  Head: Normocephalic and atraumatic.  Mouth/Throat: Oropharyngeal exudate present.  + tongue exudate, edentulous  Eyes: Conjunctivae and EOM are normal. Pupils are equal, round, and reactive to light. Right eye exhibits no discharge. Left eye exhibits discharge. No scleral icterus.  Neck: Neck supple. No JVD present. No tracheal deviation present. No thyromegaly present.  Cardiovascular: Regular rhythm and normal heart sounds.  Exam reveals no gallop and no friction rub.   No murmur heard. Pulmonary/Chest: Effort normal and breath sounds normal. No respiratory distress. He has no wheezes. He has no rales. He exhibits no tenderness.  Abdominal: Bowel sounds are normal. He exhibits no distension and no mass. There is no tenderness. There is no rebound and no guarding.  Musculoskeletal: Normal range of motion. He exhibits deformity. He exhibits no edema or tenderness.  Lymphadenopathy:    He has no cervical adenopathy.  Neurological: He is alert and oriented to person, place, and time. He exhibits normal muscle tone. Coordination abnormal.  Skin: Skin is dry. No rash noted. He is not diaphoretic. No erythema. No pallor.  Psychiatric: He has a normal mood and affect. His behavior is normal. Judgment and thought content normal.   Feet viable but without palpable dp or pt pulses bilateral    Diagnostic Studies & Laboratory data:     Recent Radiology Findings:  Ct Abdomen Pelvis Wo Contrast  Result Date: 10/16/2016 CLINICAL DATA:  Nausea and vomiting. EXAM: CT ABDOMEN AND PELVIS WITHOUT CONTRAST TECHNIQUE: Multidetector CT imaging of the abdomen and pelvis was performed following the standard protocol without IV contrast. COMPARISON:  Contrast-enhanced CT 06/27/2010 FINDINGS: Lower chest: Breathing motion artifact. No consolidation or  pleural effusion. Small hiatal  hernia. Hepatobiliary: No evidence focal lesion allowing for lack contrast. Gallbladder physiologically distended, no calcified stone. No biliary dilatation. Pancreas: No ductal dilatation or inflammation. Lack of contrast and paucity of intra-abdominal fat limits detailed assessment. Spleen: Normal in size without focal abnormality. Adrenals/Urinary Tract: No adrenal nodule. There is bilateral perinephric edema, right greater than left. This is seen on remote prior exam. No urolithiasis. No hydronephrosis. Ureters are decompressed. Urinary bladder is distended, no bladder wall thickening. Stomach/Bowel: Moderate stool throughout the colon with stool distending the rectum, question constipation. Small bowel is decompressed and not well evaluated. The stomach is decompressed. Small hiatal hernia. Portions of the appendix are tentatively identified, no pericecal or right lower quadrant inflammation to suggest appendicitis. Vascular/Lymphatic: Aortic atherosclerosis without aneurysm. No bulky adenopathy, and decreased sensitivity given lack of contrast and paucity of intra-abdominal fat. Reproductive: There is a well-defined 3.3 x 2.4 cm water density lesion in the right upper abdomen abutting the distal stomach. No surrounding inflammation. This is unchanged in size from prior exam. Other: No free fluid. No free air. Tiny fat containing umbilical hernia. Musculoskeletal: There are no acute or suspicious osseous abnormalities. IMPRESSION: 1. Perinephric edema about both kidneys, may be chronic and was seen on prior exam. This suggest underlying urinary tract infection or renal inflammation. No urolithiasis. 2. Well-defined fluid density structure in the right upper abdomen measuring 3.3 cm, unchanged from prior exam. This is likely duplication or peritoneal cyst. No inflammation. Unchanged size of a course of 6 years is consistent with a benign etiology. 3. Aortic atherosclerosis.  No  aneurysm. 4. Moderate stool burden throughout the colon, suggesting constipation. No obstruction. Electronically Signed   By: Jeb Levering M.D.   On: 10/16/2016 04:24   Nm Myocar Multi W/spect W/wall Motion / Ef  Result Date: 10/18/2016  There was no ST segment deviation noted during stress.  Defect 1: There is a large defect of moderate severity present in the basal inferoseptal, basal inferior, mid inferoseptal, mid inferior, apical septal and apical inferior location.  Defect 2: There is a large defect of severe severity present in the basal anterior, basal anteroseptal, mid anterior, mid anteroseptal, apical anterior, apical septal and apex location.  Findings consistent with prior myocardial infarction in the LAD and RCA regions with very mild peri-infarct ischemia in the apical inferior and apical septum.  This is an intermediate risk study due to reduced systolic function.  The left ventricular ejection fraction is moderately decreased (30-44%).    Dg Chest Portable 1 View  I have independently reviewed the above radiologic studies.  Recent Lab Findings: Lab Results  Component Value Date   WBC 9.8 10/21/2016   HGB 11.5 (L) 10/21/2016   HCT 34.2 (L) 10/21/2016   PLT 253 10/21/2016   GLUCOSE 172 (H) 10/21/2016   CHOL 158 10/16/2016   TRIG 49 10/16/2016   HDL 48 10/16/2016   LDLCALC 100 (H) 10/16/2016   ALT 31 10/19/2016   AST 44 (H) 10/19/2016   NA 132 (L) 10/21/2016   K 3.6 10/21/2016   CL 104 10/21/2016   CREATININE 2.05 (H) 10/21/2016   BUN 52 (H) 10/21/2016   CO2 20 (L) 10/21/2016   INR 1.22 10/19/2016   HGBA1C 10.1 (H) 10/16/2016    Martinique, Peter M, MD (Primary)    Procedures   Left Heart Cath and Coronary Angiography  Conclusion     Mid RCA lesion, 40 %stenosed.  2nd RPLB lesion, 90 %stenosed.  Ost LAD to Prox LAD lesion,  95 %stenosed.  1st Diag lesion, 85 %stenosed.  Ost Cx to Prox Cx lesion, 70 %stenosed.  Ost 1st Mrg lesion, 90  %stenosed.  LV end diastolic pressure is normal.   1. Severe 3 vessel obstructive CAD. Co-dominant circulation    - complex critical bifurcation LAD/first diagonal stenosis Medina classification 1,1,0    - Bifurcation proximal LCx/OM1 stenosis.    - 90% small PL branch 2. Normal LV function  Plan: Given complex multivessel disease in a diabetic would recommend CT surgery consult.    Indications   Non-ST elevation (NSTEMI) myocardial infarction (HCC) [I21.4 (ICD-10-CM)]  Procedural Details/Technique   Technical Details Indication: 66 yo BM with HTN and IDDM presents with a NSTEMI. Myoview study is high risk.   Procedural Details: The right wrist was prepped, draped, and anesthetized with 1% lidocaine. Using the modified Seldinger technique, a 6 French slender sheath was introduced into the right radial artery. 3 mg of verapamil was administered through the sheath, weight-based unfractionated heparin was administered intravenously. Standard Judkins catheters were used for selective coronary angiography and left ventriculography. Catheter exchanges were performed over an exchange length guidewire. There were no immediate procedural complications. A TR band was used for radial hemostasis at the completion of the procedure. The patient was transferred to the post catheterization recovery area for further monitoring.  Contrast: 35 cc   Estimated blood loss <50 mL.  During this procedure the patient was administered the following to achieve and maintain moderate conscious sedation: Versed 1 mg, Fentanyl 25 mcg, while the patient's heart rate, blood pressure, and oxygen saturation were continuously monitored. The period of conscious sedation was 17 minutes, of which I was present face-to-face 100% of this time.    Complications   Complications documented before study signed (10/21/2016 4:27 PM EDT)    No complications were associated with this study.  Documented by Martinique, Peter M, MD -  10/21/2016 4:27 PM EDT    Coronary Findings   Dominance: Co-dominant  Left Anterior Descending  Ost LAD to Prox LAD lesion, 95% stenosed.  First Diagonal Branch  1st Diag lesion, 85% stenosed. The lesion is moderately calcified.  Left Circumflex  Ost Cx to Prox Cx lesion, 70% stenosed.  First Obtuse Marginal Branch  Ost 1st Mrg lesion, 90% stenosed.  Right Coronary Artery  Mid RCA lesion, 40% stenosed.  Second Right Posterolateral  2nd RPLB lesion, 90% stenosed.  Left Heart   Left Ventricle LV end diastolic pressure is normal.    Coronary Diagrams   Diagnostic Diagram                                *[place]*                   *[facility]*                         [address], [place] [address]                            [phone]  ------------------------------------------------------------------- Transthoracic Echocardiography  Patient:    Londen, Bok MR #:       [id number] Study Date: 10/16/2016 Gender:     Jerilynn Mages  Age:        30 Height:     177.8 cm Weight:     65.4 kg BSA:        1.79 m^2 Pt. Status: Room:       [address]    Ivor Costa [id number]  [id number]     TDV, VOHYW [id number]  Alvie Heidelberg [id number]  ATTENDING    Horton, Tripp, Inpatient  SONOGRAPHER  [facility]  cc:  ------------------------------------------------------------------- LV EF: 60% -   65%  ------------------------------------------------------------------- Indications:      Chest pain 786.51.  ------------------------------------------------------------------- History:   PMH:   Chronic obstructive pulmonary disease.  Risk factors:  Hypertension. Diabetes mellitus. Dyslipidemia.  ------------------------------------------------------------------- Study Conclusions  - Left ventricle: The cavity size was normal. Wall thickness was   normal. Systolic function was normal. The  estimated ejection   fraction was in the range of 60% to 65%. Wall motion was normal;   there were no regional wall motion abnormalities. Doppler   parameters are consistent with abnormal left ventricular   relaxation (grade 1 diastolic dysfunction). - Mitral valve: There was mild regurgitation. - Pericardium, extracardiac: A trivial pericardial effusion was   identified.  ------------------------------------------------------------------- Study data:  No prior study was available for comparison.  Study status:  Routine.  Procedure:  Transthoracic echocardiography. Image quality was adequate.  Study completion:  There were no complications.          Transthoracic echocardiography.  M-mode, complete 2D, spectral Doppler, and color Doppler.  Birthdate: Patient birthdate: [birth year].  Age:  Patient is 66 yr old.  Sex: Gender: male.    BMI: 20.7 kg/m^2.  Blood pressure:     159/87 Patient status:  Inpatient.  Study date:  Study date: 10/16/2016. Study time: 01:47 PM.  Location:  Bedside.  -------------------------------------------------------------------  ------------------------------------------------------------------- Left ventricle:  The cavity size was normal. Wall thickness was normal. Systolic function was normal. The estimated ejection fraction was in the range of 60% to 65%. Wall motion was normal; there were no regional wall motion abnormalities. Doppler parameters are consistent with abnormal left ventricular relaxation (grade 1 diastolic dysfunction). There was no evidence of elevated ventricular filling pressure by Doppler parameters.  ------------------------------------------------------------------- Aortic valve:   Structurally normal valve.   Cusp separation was normal.  Doppler:  Transvalvular velocity was within the normal range. There was no stenosis. There was no regurgitation.  ------------------------------------------------------------------- Aorta:   Aortic root: The aortic root was normal in size. Ascending aorta: The ascending aorta was normal in size.  ------------------------------------------------------------------- Mitral valve:   Structurally normal valve.   Leaflet separation was normal.  Doppler:  Transvalvular velocity was within the normal range. There was no evidence for stenosis. There was mild regurgitation.    Peak gradient (D): 2 mm Hg.  ------------------------------------------------------------------- Left atrium:  The atrium was normal in size.  ------------------------------------------------------------------- Right ventricle:  The cavity size was normal. Systolic function was normal.  ------------------------------------------------------------------- Pulmonic valve:   Poorly visualized.  The valve appears to be grossly normal.    Doppler:  Transvalvular velocity was within the normal range. There was no significant regurgitation.  ------------------------------------------------------------------- Tricuspid valve:   Structurally normal valve.   Leaflet separation was normal.  Doppler:  Transvalvular velocity was within the normal range. There was no regurgitation.  ------------------------------------------------------------------- Right atrium:  The atrium was normal in size.  ------------------------------------------------------------------- Pericardium:  A trivial pericardial effusion was identified.  ------------------------------------------------------------------- Systemic veins:  Inferior vena cava: The vessel was normal in size. The respirophasic diameter changes were in the normal range (>= 50%), consistent with normal central venous pressure.  ------------------------------------------------------------------- Measurements   Left ventricle                          Value        Reference  LV ID, ED, PLAX chordal         (L)     36    mm     43 - 52  LV ID, ES, PLAX chordal                  27    mm     23 - 38  LV fx shortening, PLAX chordal  (L)     25    %      >=29  LV PW thickness, ED                     10    mm     ----------  IVS/LV PW ratio, ED                     0.9          <=1.3  Stroke volume, 2D                       79    ml     ----------  Stroke volume/bsa, 2D                   44    ml/m^2 ----------  LV e&', lateral                          8.16  cm/s   ----------  LV E/e&', lateral                        9.55         ----------  LV e&', medial                           7.4   cm/s   ----------  LV E/e&', medial                         10.53        ----------  LV e&', average                          7.78  cm/s   ----------  LV E/e&', average                        10.01        ----------    Ventricular septum                      Value        Reference  IVS thickness, ED                       9     mm     ----------    LVOT  Value        Reference  LVOT ID, S                              19    mm     ----------  LVOT area                               2.84  cm^2   ----------  LVOT peak velocity, S                   127   cm/s   ----------  LVOT mean velocity, S                   91.3  cm/s   ----------  LVOT VTI, S                             27.8  cm     ----------  LVOT peak gradient, S                   6     mm Hg  ----------    Aorta                                   Value        Reference  Aortic root ID, ED                      32    mm     ----------    Left atrium                             Value        Reference  LA ID, A-P, ES                          29    mm     ----------  LA ID/bsa, A-P                          1.62  cm/m^2 <=2.2  LA volume, ES, 1-p A4C                  13.2  ml     ----------  LA volume/bsa, ES, 1-p A4C              7.4   ml/m^2 ----------  LA volume, ES, 1-p A2C                  45.5  ml     ----------  LA volume/bsa, ES, 1-p A2C              25.4  ml/m^2 ----------     Mitral valve                            Value        Reference  Mitral E-wave peak velocity             77.9  cm/s   ----------  Mitral A-wave peak velocity  97.4  cm/s   ----------  Mitral peak gradient, D                 2     mm Hg  ----------  Mitral E/A ratio, peak                  0.8          ----------    Pulmonary arteries                      Value        Reference  PA pressure, S, DP                      15    mm Hg  <=30    Tricuspid valve                         Value        Reference  Tricuspid regurg peak velocity          176   cm/s   ----------  Tricuspid peak RV-RA gradient           12    mm Hg  ----------    Right atrium                            Value        Reference  RA ID, S-I, ES, A4C                     39.6  mm     34 - 49  RA area, ES, A4C                        9.08  cm^2   8.3 - 19.5  RA volume, ES, A/L                      16.1  ml     ----------  RA volume/bsa, ES, A/L                  9     ml/m^2 ----------    Systemic veins                          Value        Reference  Estimated CVP                           3     mm Hg  ----------    Right ventricle                         Value        Reference  RV ID, minor axis, ED, A4C base         25    mm     ----------  TAPSE                                   21.7  mm     ----------  RV pressure, S, DP  15    mm Hg  <=30  RV s&', lateral, S                       16.3  cm/s   ----------  Legend: (L)  and  (H)  [place] values outside specified reference range.  ------------------------------------------------------------------- Prepared and Electronically Authenticated by  Sanda Klein, MD 2018-05-20T15:18:07   Chronic Kidney Disease   Stage I     GFR >90  Stage II    GFR 60-89  Stage IIIA GFR 45-59  Stage IIIB GFR 30-44  Stage IV   GFR 15-29  Stage V    GFR  <15  Lab Results  Component Value Date   CREATININE 2.07 (H) 10/22/2016   Estimated Creatinine  Clearance: 33.2 mL/min (A) (by C-G formula based on SCr of 2.07 mg/dL (H)).  Lab Results  Component Value Date   TROPONINI 0.59 ([place]) 10/16/2016      Assessment / Plan:  66 year old male with multiple medical cardiac risk factors and NSTEMI.  1/ Recent subendocarial mi with elevated troponini, with high grade LAD  disease, but small poor quality targets in the RCA/PL.  2/ Satge IIIb chronic kidney disease 3/ Long standing dm with complications of CKD and CAD peripherial vascular disease  4 lives alone so will need care post op    Plan pre cabg dopplers with vein mapping  Monitor renal function  If becomes unstable consider intervention on LAD   Considering  CABG        I  spent 40 minutes counseling the patient face to face and 50% or more the  time was spent in counseling and coordination of care. The total time spent in the appointment was 60  minutes.  Grace Isaac MD      [address] [place] [address] Office [phone]   [place]

## 2016-10-21 NOTE — Progress Notes (Signed)
Discussed with Dr. Posey Pronto who has consulted Renal.  They will be seeing patient formally but Dr. Posey Pronto was told that Renal feels there is very low risk (1.1%) of progression of renal disease to needing HD from cath.  The patient has been NPO so will place on cath schedule for later this afternoon for a coronary angiogram without LV gram to define coronary anatomy.

## 2016-10-21 NOTE — Progress Notes (Addendum)
Physical Therapy Treatment Patient Details Name: Shawn Collins MRN: [id number] DOB: 1950/08/22 Today's Date: 10/21/2016    History of Present Illness 66 y.o. male admitted to [facility] on 10/15/16 for N/V and abdominal discomfort.  Troponins were elevated, but cardiology consulted and did not think it was a STEMI, but possibly subendocardial ischemia.  He is supposed to go to the cath lab 10/19/16, but las a low grade fever and some increased kidney dysfunction.  Pt with significant PMHx of HTN, DM, COPD, and CKD stage III.    PT Comments    Pt progressing well with mobility, he tolerated increased ambulation distance, improved balance. SaO2 91% on RA walking, HR 78 walking. Now recommending HHPT, pt feels he can manage at home and stated he has people who can come help as needed.   Follow Up Recommendations  Home health PT;Supervision for mobility/OOB     Equipment Recommendations  Rolling walker with 5" wheels    Recommendations for Other Services       Precautions / Restrictions Precautions Precautions: Fall Restrictions Weight Bearing Restrictions: No    Mobility  Bed Mobility Overal bed mobility: Modified Independent             General bed mobility comments: with rail  Transfers Overall transfer level: Needs assistance Equipment used: Rolling walker (2 wheeled) Transfers: Sit to/from Stand Sit to Stand: Min assist         General transfer comment: VCs for hand placement, min A to power up  Ambulation/Gait Ambulation/Gait assistance: Min guard Ambulation Distance (Feet): 160 Feet Assistive device: Rolling walker (2 wheeled) Gait Pattern/deviations: Step-through pattern;Decreased stride length;Narrow base of support Gait velocity: Decreased Gait velocity interpretation: Below normal speed for age/gender General Gait Details: steady walking straight, mild unsteadiness but no overt LOB with turning, HR 78, SaO2 91% RA, pt denied lightheadedness throughout walk.,  distance limited by fatigue   Stairs            Wheelchair Mobility    Modified Rankin (Stroke Patients Only)       Balance     Sitting balance-Leahy Scale: Good       Standing balance-Leahy Scale: Fair                              Cognition Arousal/Alertness: Awake/alert Behavior During Therapy: Flat affect Overall Cognitive Status: No family/caregiver present to determine baseline cognitive functioning                                 General Comments: Pt with flat affect however, would respond to questions when asked.       Exercises      General Comments        Pertinent Vitals/Pain Pain Assessment: No/denies pain    Home Living                      Prior Function            PT Goals (current goals can now be found in the care plan section) Acute Rehab PT Goals Patient Stated Goal: to feel better and not get nauseated when he eats PT Goal Formulation: With patient Time For Goal Achievement: 11/02/16 Potential to Achieve Goals: Good Progress towards PT goals: Progressing toward goals    Frequency    Min 3X/week      PT Plan Discharge  plan needs to be updated    Co-evaluation              AM-PAC PT "6 Clicks" Daily Activity  Outcome Measure  Difficulty turning over in bed (including adjusting bedclothes, sheets and blankets)?: None Difficulty moving from lying on back to sitting on the side of the bed? : A Little Difficulty sitting down on and standing up from a chair with arms (e.g., wheelchair, bedside commode, etc,.)?: A Little Help needed moving to and from a bed to chair (including a wheelchair)?: A Little Help needed walking in hospital room?: A Little Help needed climbing 3-5 steps with a railing? : A Little 6 Click Score: 19    End of Session Equipment Utilized During Treatment: Gait belt Activity Tolerance: Patient tolerated treatment well Patient left: in chair;with call  bell/phone within reach;with chair alarm set Nurse Communication: Mobility status PT Visit Diagnosis: Difficulty in walking, not elsewhere classified (R26.2);Muscle weakness (generalized) (M62.81)     Time: 5035-4656 PT Time Calculation (min) (ACUTE ONLY): 17 min  Charges:  $Gait Training: 8-22 mins                    G Codes:          Philomena Doheny 10/21/2016, 11:39 AM [phone]

## 2016-10-21 NOTE — Interval H&P Note (Signed)
History and Physical Interval Note:  10/21/2016 3:41 PM  Shawn Collins  has presented today for surgery, with the diagnosis of chest pain  The various methods of treatment have been discussed with the patient and family. After consideration of risks, benefits and other options for treatment, the patient has consented to  Procedure(s): Left Heart Cath and Coronary Angiography (N/A) as a surgical intervention .  The patient's history has been reviewed, patient examined, no change in status, stable for surgery.  I have reviewed the patient's chart and labs.  Questions were answered to the patient's satisfaction.   Cath Lab Visit (complete for each Cath Lab visit)  Clinical Evaluation Leading to the Procedure:   ACS: Yes.    Non-ACS:    Anginal Classification: CCS III  Anti-ischemic medical therapy: Minimal Therapy (1 class of medications)  Non-Invasive Test Results: High-risk stress test findings: cardiac mortality >3%/year  Prior CABG: No previous CABG        Shawn Collins [facility] 10/21/2016 3:41 PM

## 2016-10-21 NOTE — Progress Notes (Signed)
Progress Note  Patient Name: Shawn Collins Date of Encounter: 10/21/2016  Primary Cardiologist: Dr. Radford Pax  Subjective   Denies any chest pain, dyspnea or orthopnea.   Inpatient Medications    Scheduled Meds: . amLODipine  10 mg Oral Daily  . aspirin  81 mg Oral Daily  . carvedilol  6.25 mg Oral BID WC  . enoxaparin (LOVENOX) injection  40 mg Subcutaneous Q24H  . hydrALAZINE  25 mg Oral Q8H  . insulin aspart  0-5 Units Subcutaneous QHS  . insulin aspart  0-9 Units Subcutaneous TID WC  . metoCLOPramide (REGLAN) injection  5 mg Intravenous Q6H  . pantoprazole  40 mg Oral BID  . polyethylene glycol  17 g Oral Daily  . senna-docusate  1 tablet Oral BID  . sodium chloride flush  3 mL Intravenous Q12H   Continuous Infusions: . sodium chloride    . sodium chloride 75 mL/hr at 10/21/16 0606  . sodium chloride Stopped (10/19/16 0900)   PRN Meds: sodium chloride, acetaminophen, albuterol, morphine injection, ondansetron (ZOFRAN) IV, sodium chloride flush, zolpidem   Vital Signs    Vitals:   10/20/16 2039 10/21/16 0004 10/21/16 0547 10/21/16 0812  BP:  140/78 136/65 130/66  Pulse: 70 73 73 77  Resp:   16 (!) 28  Temp: 98.4 F (36.9 C) 99.9 F (37.7 C) 99.7 F (37.6 C) 98.8 F (37.1 C)  TempSrc: Oral Oral Oral   SpO2: 94% 93% 92% 94%  Weight:   147 lb 8 oz (66.9 kg)   Height:        Intake/Output Summary (Last 24 hours) at 10/21/16 0934 Last data filed at 10/21/16 0800  Gross per 24 hour  Intake              240 ml  Output              850 ml  Net             -610 ml   Filed Weights   10/19/16 0537 10/20/16 0500 10/21/16 0547  Weight: 145 lb 12.8 oz (66.1 kg) 144 lb 3.2 oz (65.4 kg) 147 lb 8 oz (66.9 kg)    Telemetry    NSR int he 70's and 80's - Personally Reviewed  ECG    No new tracings  Physical Exam   GEN: WD, WN in NAD Neck: No JVD or bruit Cardiac: RRR no M/R/G Respiratory: CTA bilaterally GI: soft, NT, ND active BS MS:  no edema Neuro:   Nonfocal Psych: normal affect  Labs    Chemistry Recent Labs Lab 10/16/16 0728 10/17/16 0450  10/19/16 0254 10/19/16 1412 10/20/16 0340 10/21/16 0323  NA 134* 135  < > 135 139  138 135 132*  K 4.7 4.2  < > 4.0 3.8  3.7 3.8 3.6  CL 103 104  < > 103 108  107 105 104  CO2 21* 22  < > 22 21*  21* 19* 20*  GLUCOSE 257* 120*  < > 180* 144*  144* 172* 172*  BUN 19 25*  < > 41* 48*  48* 48* 52*  CREATININE 1.31* 1.56*  < > 1.91* 2.00*  2.02* 1.93* 2.05*  CALCIUM 8.8* 8.5*  < > 8.7* 8.8*  8.9 8.3* 8.0*  PROT 6.2* 6.1*  --  6.3*  --   --   --   ALBUMIN 3.2* 3.1*  --  3.0*  --  3.0* 2.5*  AST 31 38  --  44*  --   --   --  ALT 20 24  --  31  --   --   --   ALKPHOS 59 57  --  55  --   --   --   BILITOT 0.7 0.7  --  0.8  --   --   --   GFRNONAA 55* 44*  < > 35* 33*  32* 34* 32*  GFRAA >60 51*  < > 40* 38*  38* 40* 37*  ANIONGAP 10 9  < > 10 10  10 11 8   < > = values in this interval not displayed.   Hematology  Recent Labs Lab 10/19/16 0254 10/20/16 0340 10/21/16 0323  WBC 11.9* 12.1* 9.8  RBC 3.91* 3.95* 3.94*  HGB 11.4* 11.4* 11.5*  HCT 34.5* 34.7* 34.2*  MCV 88.2 87.8 86.8  MCH 29.2 28.9 29.2  MCHC 33.0 32.9 33.6  RDW 12.5 12.5 12.3  PLT 271 278 253    Cardiac Enzymes  Recent Labs Lab 10/16/16 0217 10/16/16 0512 10/16/16 1014 10/16/16 1450  TROPONINI 0.69* 0.51* 0.55* 0.59*     Recent Labs Lab 10/15/16 2343  TROPIPOC 0.35*     BNPNo results for input(s): BNP, PROBNP in the last 168 hours.   DDimer No results for input(s): DDIMER in the last 168 hours.   Radiology    No results found.  Cardiac Studies  Nuclear stress test 09/2016 Study Result    There was no ST segment deviation noted during stress.  Defect 1: There is a large defect of moderate severity present in the basal inferoseptal, basal inferior, mid inferoseptal, mid inferior, apical septal and apical inferior location.  Defect 2: There is a large defect of severe severity  present in the basal anterior, basal anteroseptal, mid anterior, mid anteroseptal, apical anterior, apical septal and apex location.  Findings consistent with prior myocardial infarction in the LAD and RCA regions with very mild peri-infarct ischemia in the apical inferior and apical septum.  This is an intermediate risk study due to reduced systolic function.  The left ventricular ejection fraction is moderately decreased (30-44%).     Patient Profile     66 y.o. male with PMH significant for HTN, NIDDM, dyslipidemia, and smoking who presented to the ED with two days of malaise, fatigue, and persistent nausea with bouts of emesis. His ECG does not meet STEMI criteria but could represent subendocardial ischemia.   Assessment & Plan    1. Elevated troponin: Troponin is (0.51>0.55>0.59) Demand ischemia favored in the setting of hypertensive urgency. 2 D echo done today showed normal LVF with EF 60-65% with no wall motion abnormalities. -- continue 81mg  daily, BB and IV Heparin gtt. -- Lexiscan Myoview abnormal - Intermediate risk study with EF moderately decreased at 30-44% and evidence of scar in the  basal inferoseptal, basal inferior, mid inferoseptal, mid inferior, apical septal and apical inferior location with peri infarct ischemia in the apical inferior and apical septum.   -- Will hold off on cath today as creatinine now bumped back up to 2.05.   -- Gently hydrate over weekend and hopefully renal function will improve that he can be cathed on Monday.   2. KXF:GHWEXHB for hypertensive emergency given elevated troponin and AKI --  BP no controlled at 130/38mmHg this am. --  Continue amlodipine 10mg  daily, Coreg to 12.5mg  BID and Hydralazine 25mg  q8 hours  3. ZJI:RCVELFYBOF per primary team. SCr still elevated 1.56--->max 2.02 --> 1.93--> 2.05 today. -- He has poor PO intake so will continue gently  hydrate over weekend and hopefully renal function will improve that he can be  cathed on Monday.  4.  DCM ? Ischemic with abnormal nuclear stress test.  EF 30-44%. -- continue coreg and hydralazine -- no ACE I or ARB due to AKI.    Signed, Fransico Him, MD  10/21/2016, 9:34 AM

## 2016-10-21 NOTE — Plan of Care (Signed)
Problem: Skin Integrity: Goal: Risk for impaired skin integrity will decrease Outcome: Progressing No signs of skin breakdown this shift. Pt turns independently in bed. Pt didn't ambulate this shift and insisted on using the urinal while sitting on the edge of the bed.    Problem: Fluid Volume: Goal: Ability to maintain a balanced intake and output will improve Outcome: Progressing Pt had no episodes of nausea or vomiting. Creatinine elevated *2.05.

## 2016-10-21 NOTE — Progress Notes (Signed)
ANTICOAGULATION CONSULT NOTE - Initial Consult  Pharmacy Consult for heparin Indication: chest pain/ACS  No Known Allergies  Patient Measurements: Height: 5\' 10"  (177.8 cm) Weight: 147 lb 8 oz (66.9 kg) IBW/kg (Calculated) : 73 Heparin Dosing Weight: 67kg  Vital Signs: Temp: 98.1 F (36.7 C) (05/25 1131) BP: 140/77 (05/25 1738) Pulse Rate: 71 (05/25 1738)  Labs:  Recent Labs  10/19/16 0254 10/19/16 1412 10/20/16 0340 10/21/16 0323  HGB 11.4*  --  11.4* 11.5*  HCT 34.5*  --  34.7* 34.2*  PLT 271  --  278 253  LABPROT 15.4*  --   --   --   INR 1.22  --   --   --   CREATININE 1.91* 2.00*  2.02* 1.93* 2.05*    Estimated Creatinine Clearance: 33.1 mL/min (A) (by C-G formula based on SCr of 2.05 mg/dL (H)).   Medical History: Past Medical History:  Diagnosis Date  . Diabetes mellitus without complication ([place])   . Emphysema lung ([place])   . Hyperlipidemia   . Hypertension      Assessment: 66 year old male with multiple cardiac risk factors who presents to the emergency department with a 2 day history of malaise, fatigue and persistent nausea with bouts of emesis. 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 Cardiac surgery has been consulted. Current plan is to start heparin in early am and likely CABG next week.  Goal of Therapy:  Heparin level 0.3-0.7 units/ml Monitor platelets by anticoagulation protocol: Yes   Plan:  Start heparin infusion at 900 units/hr Check anti-Xa level in 8 hours and daily while on heparin Continue to monitor H&H and platelets  Erin Hearing PharmD., BCPS Clinical Pharmacist Pager [phone] 10/21/2016 6:48 PM

## 2016-10-21 NOTE — H&P (View-Only) (Signed)
Progress Note  Patient Name: Shawn Collins Date of Encounter: 10/21/2016  Primary Cardiologist: Dr. Radford Pax  Subjective   Denies any chest pain, dyspnea or orthopnea.   Inpatient Medications    Scheduled Meds: . amLODipine  10 mg Oral Daily  . aspirin  81 mg Oral Daily  . carvedilol  6.25 mg Oral BID WC  . enoxaparin (LOVENOX) injection  40 mg Subcutaneous Q24H  . hydrALAZINE  25 mg Oral Q8H  . insulin aspart  0-5 Units Subcutaneous QHS  . insulin aspart  0-9 Units Subcutaneous TID WC  . metoCLOPramide (REGLAN) injection  5 mg Intravenous Q6H  . pantoprazole  40 mg Oral BID  . polyethylene glycol  17 g Oral Daily  . senna-docusate  1 tablet Oral BID  . sodium chloride flush  3 mL Intravenous Q12H   Continuous Infusions: . sodium chloride    . sodium chloride 75 mL/hr at 10/21/16 0606  . sodium chloride Stopped (10/19/16 0900)   PRN Meds: sodium chloride, acetaminophen, albuterol, morphine injection, ondansetron (ZOFRAN) IV, sodium chloride flush, zolpidem   Vital Signs    Vitals:   10/20/16 2039 10/21/16 0004 10/21/16 0547 10/21/16 0812  BP:  140/78 136/65 130/66  Pulse: 70 73 73 77  Resp:   16 (!) 28  Temp: 98.4 F (36.9 C) 99.9 F (37.7 C) 99.7 F (37.6 C) 98.8 F (37.1 C)  TempSrc: Oral Oral Oral   SpO2: 94% 93% 92% 94%  Weight:   147 lb 8 oz (66.9 kg)   Height:        Intake/Output Summary (Last 24 hours) at 10/21/16 0934 Last data filed at 10/21/16 0800  Gross per 24 hour  Intake              240 ml  Output              850 ml  Net             -610 ml   Filed Weights   10/19/16 0537 10/20/16 0500 10/21/16 0547  Weight: 145 lb 12.8 oz (66.1 kg) 144 lb 3.2 oz (65.4 kg) 147 lb 8 oz (66.9 kg)    Telemetry    NSR int he 70's and 80's - Personally Reviewed  ECG    No new tracings  Physical Exam   GEN: WD, WN in NAD Neck: No JVD or bruit Cardiac: RRR no M/R/G Respiratory: CTA bilaterally GI: soft, NT, ND active BS MS:  no edema Neuro:   Nonfocal Psych: normal affect  Labs    Chemistry Recent Labs Lab 10/16/16 0728 10/17/16 0450  10/19/16 0254 10/19/16 1412 10/20/16 0340 10/21/16 0323  NA 134* 135  < > 135 139  138 135 132*  K 4.7 4.2  < > 4.0 3.8  3.7 3.8 3.6  CL 103 104  < > 103 108  107 105 104  CO2 21* 22  < > 22 21*  21* 19* 20*  GLUCOSE 257* 120*  < > 180* 144*  144* 172* 172*  BUN 19 25*  < > 41* 48*  48* 48* 52*  CREATININE 1.31* 1.56*  < > 1.91* 2.00*  2.02* 1.93* 2.05*  CALCIUM 8.8* 8.5*  < > 8.7* 8.8*  8.9 8.3* 8.0*  PROT 6.2* 6.1*  --  6.3*  --   --   --   ALBUMIN 3.2* 3.1*  --  3.0*  --  3.0* 2.5*  AST 31 38  --  44*  --   --   --  ALT 20 24  --  31  --   --   --   ALKPHOS 59 57  --  55  --   --   --   BILITOT 0.7 0.7  --  0.8  --   --   --   GFRNONAA 55* 44*  < > 35* 33*  32* 34* 32*  GFRAA >60 51*  < > 40* 38*  38* 40* 37*  ANIONGAP 10 9  < > 10 10  10 11 8   < > = values in this interval not displayed.   Hematology  Recent Labs Lab 10/19/16 0254 10/20/16 0340 10/21/16 0323  WBC 11.9* 12.1* 9.8  RBC 3.91* 3.95* 3.94*  HGB 11.4* 11.4* 11.5*  HCT 34.5* 34.7* 34.2*  MCV 88.2 87.8 86.8  MCH 29.2 28.9 29.2  MCHC 33.0 32.9 33.6  RDW 12.5 12.5 12.3  PLT 271 278 253    Cardiac Enzymes  Recent Labs Lab 10/16/16 0217 10/16/16 0512 10/16/16 1014 10/16/16 1450  TROPONINI 0.69* 0.51* 0.55* 0.59*     Recent Labs Lab 10/15/16 2343  TROPIPOC 0.35*     BNPNo results for input(s): BNP, PROBNP in the last 168 hours.   DDimer No results for input(s): DDIMER in the last 168 hours.   Radiology    No results found.  Cardiac Studies  Nuclear stress test 09/2016 Study Result    There was no ST segment deviation noted during stress.  Defect 1: There is a large defect of moderate severity present in the basal inferoseptal, basal inferior, mid inferoseptal, mid inferior, apical septal and apical inferior location.  Defect 2: There is a large defect of severe severity  present in the basal anterior, basal anteroseptal, mid anterior, mid anteroseptal, apical anterior, apical septal and apex location.  Findings consistent with prior myocardial infarction in the LAD and RCA regions with very mild peri-infarct ischemia in the apical inferior and apical septum.  This is an intermediate risk study due to reduced systolic function.  The left ventricular ejection fraction is moderately decreased (30-44%).     Patient Profile     66 y.o. male with PMH significant for HTN, NIDDM, dyslipidemia, and smoking who presented to the ED with two days of malaise, fatigue, and persistent nausea with bouts of emesis. His ECG does not meet STEMI criteria but could represent subendocardial ischemia.   Assessment & Plan    1. Elevated troponin: Troponin is (0.51>0.55>0.59) Demand ischemia favored in the setting of hypertensive urgency. 2 D echo done today showed normal LVF with EF 60-65% with no wall motion abnormalities. -- continue 81mg  daily, BB and IV Heparin gtt. -- Lexiscan Myoview abnormal - Intermediate risk study with EF moderately decreased at 30-44% and evidence of scar in the  basal inferoseptal, basal inferior, mid inferoseptal, mid inferior, apical septal and apical inferior location with peri infarct ischemia in the apical inferior and apical septum.   -- Will hold off on cath today as creatinine now bumped back up to 2.05.   -- Gently hydrate over weekend and hopefully renal function will improve that he can be cathed on Monday.   2. VPX:TGGYIRS for hypertensive emergency given elevated troponin and AKI --  BP no controlled at 130/41mmHg this am. --  Continue amlodipine 10mg  daily, Coreg to 12.5mg  BID and Hydralazine 25mg  q8 hours  3. [id number] per primary team. SCr still elevated 1.56--->max 2.02 --> 1.93--> 2.05 today. -- He has poor PO intake so will continue gently  hydrate over weekend and hopefully renal function will improve that he can be  cathed on Monday.  4.  DCM ? Ischemic with abnormal nuclear stress test.  EF 30-44%. -- continue coreg and hydralazine -- no ACE I or ARB due to AKI.    Signed, Fransico Him, MD  10/21/2016, 9:34 AM

## 2016-10-21 NOTE — Progress Notes (Signed)
[facility] Progress Note  Patient: Shawn Collins HFW:[id number]   PCP: Nolene Ebbs, MD DOB: [birth year]   DOA: 10/15/2016   DOS: 10/21/2016   Date of Service: the patient was seen and examined on 10/21/2016  Subjective: Feeling better, continues to have no acute complaint. No nausea no vomiting.  Brief hospital course: Pt. with PMH of type II DM, HTN, dyslipidemia; admitted on 10/15/2016, presented with complaint of  nausea vomiting, was found to have viral gastroenteritis and NSTEMI. Currently further plan is follow renal function after cardiac cath.  Assessment and Plan: 1. Nausea vomiting and abdominal pain. Likely viral gastroenteritis. Possibility of gastroparesis CT abdomen pelvis unremarkable, x-ray abdomen unremarkable. Right upper quadrant ultrasound shows Dilated CBD which is unchanged from prior ultrasound. LFT stable no indication for acute intervention. Continue supportive measures, placing on scheduled Reglan. Stool softener.  2. Non-STEMI. Abnormal stress test. Troponins are trending downwards. Could be contributing to patient's nausea and vomiting. Cardiology consulted, plan is to perform cardiac catheterization although serum creatinine worsening prior to cath, now stable.  Cardiology would like to continue IV hydration. And plan cath. Continue on amlodipine, carvedilol, no indication for diuretics at present. Echo 60-65% EF. Discussed with nephrology, patient's chances of developing severe renal dysfunction and going on hemodialysis is 1 out of 10.  3. Acute on chronic kidney disease stage IV. Renal function progressively worsening. Now stable. Due to poor by mouth intake as well as continues use of lisinopril.  Avoid any further nephrotoxic medication, continue IV hydration. Patient is going to receive contrast for cardiac catheterization which may worsen renal function. Urine output remains adequate, patient actually negative by ins and outs calculation  although unsure whether they are accurate. Blood pressure was actually elevated. Nephrology consulted for further input  Will follow recommendation. CT scan does not show any obstruction but does show perinephric edema which is unchanged from prior years. UA x2 bland.  4. Type II DM.  Uncontrolled. Hyperglycemia. On Amaryl at home, hemoglobin A1c 10.1. Continue sliding scale insulin, Oral hypoglycemic agents as well as long-acting insulin is currently on hold due to poor by mouth intake. diabetic gastroparesis causing nausea and vomiting cannot be ruled out.  5.Fever with chills. Extensive workup performed, urine culture unremarkable  urine analysis unremarkable.  CT abdomen pelvis unremarkable other than perinephric edema but no evidence of UTI. X-ray chest unremarkable. Ultrasound abdomen unremarkable. ESR, CRP, Pro calcitonin level unremarkable. Blood culture negative for 2 days, no indication for further workup or treatment.  Diet: NPO at present cardiac diet following cath DVT Prophylaxis: subcutaneous Heparin  Advance goals of care discussion: Full code  Family Communication: no family was present at bedside, at the time of interview.   Disposition:  Discharge to home with home health.  Consultants: cardiology nephrology  Procedures: Echocardiogram, stress test  Antibiotics: Anti-infectives    None       Objective: Physical Exam: Vitals:   10/21/16 0004 10/21/16 0547 10/21/16 0812 10/21/16 1131  BP: 140/78 136/65 130/66 121/64  Pulse: 73 73 77   Resp:  16 (!) 28 15  Temp: 99.9 F (37.7 C) 99.7 F (37.6 C) 98.8 F (37.1 C) 98.1 F (36.7 C)  TempSrc: Oral Oral    SpO2: 93% 92% 94% 91%  Weight:  66.9 kg (147 lb 8 oz)    Height:        Intake/Output Summary (Last 24 hours) at 10/21/16 1430 Last data filed at 10/21/16 0800  Gross per 24 hour  Intake  240 ml  Output              850 ml  Net             -610 ml   Filed Weights   10/19/16  0537 10/20/16 0500 10/21/16 0547  Weight: 66.1 kg (145 lb 12.8 oz) 65.4 kg (144 lb 3.2 oz) 66.9 kg (147 lb 8 oz)   General: Alert, Awake and Oriented to Time, Place and Person. Appear in mild distress, affect appropriate Eyes: PERRL, Conjunctiva normal ENT: Oral Mucosa clear moist. Neck: no JVD, no Abnormal Mass Or lumps Cardiovascular: S1 and S2 Present, no Murmur, Respiratory: Bilateral Air entry equal and Decreased, no use of accessory muscle, Clear to Auscultation, no Crackles, no wheezes Abdomen: Bowel Sound present, Soft and no tenderness Skin: no redness, no Rash, no induration Extremities: no Pedal edema, no calf tenderness Neurologic: Grossly no focal neuro deficit. Bilaterally Equal motor strength  Data Reviewed: CBC:  Recent Labs Lab 10/17/16 0450 10/18/16 0331 10/19/16 0254 10/20/16 0340 10/21/16 0323  WBC 11.9* 10.3 11.9* 12.1* 9.8  HGB 11.3* 11.1* 11.4* 11.4* 11.5*  HCT 34.0* 33.9* 34.5* 34.7* 34.2*  MCV 87.9 87.6 88.2 87.8 86.8  PLT 275 285 271 278 443   Basic Metabolic Panel:  Recent Labs Lab 10/18/16 0331 10/19/16 0254 10/19/16 1412 10/20/16 0340 10/21/16 0323  NA 135 135 139  138 135 132*  K 3.8 4.0 3.8  3.7 3.8 3.6  CL 104 103 108  107 105 104  CO2 22 22 21*  21* 19* 20*  GLUCOSE 137* 180* 144*  144* 172* 172*  BUN 27* 41* 48*  48* 48* 52*  CREATININE 1.60* 1.91* 2.00*  2.02* 1.93* 2.05*  CALCIUM 8.6* 8.7* 8.8*  8.9 8.3* 8.0*  MG  --   --   --  2.2  --   PHOS  --   --   --  3.6 2.4*    Liver Function Tests:  Recent Labs Lab 10/15/16 2329 10/16/16 0728 10/17/16 0450 10/19/16 0254 10/20/16 0340 10/21/16 0323  AST 48* 31 38 44*  --   --   ALT '27 20 24 31  ' --   --   ALKPHOS 62 59 57 55  --   --   BILITOT 1.4* 0.7 0.7 0.8  --   --   PROT 7.2 6.2* 6.1* 6.3*  --   --   ALBUMIN 3.7 3.2* 3.1* 3.0* 3.0* 2.5*    Recent Labs Lab 10/15/16 2329 10/19/16 0254  LIPASE 18 16   No results for input(s): AMMONIA in the last 168  hours. Coagulation Profile:  Recent Labs Lab 10/19/16 0254  INR 1.22   Cardiac Enzymes:  Recent Labs Lab 10/16/16 0217 10/16/16 0512 10/16/16 1014 10/16/16 1450  TROPONINI 0.69* 0.51* 0.55* 0.59*   BNP (last 3 results) No results for input(s): PROBNP in the last 8760 hours. CBG:  Recent Labs Lab 10/20/16 1109 10/20/16 1606 10/20/16 2043 10/21/16 0737 10/21/16 1115  GLUCAP 198* 202* 182* 163* 159*   Studies: No results found.  Scheduled Meds: . amLODipine  10 mg Oral Daily  . aspirin  81 mg Oral Daily  . carvedilol  6.25 mg Oral BID WC  . enoxaparin (LOVENOX) injection  40 mg Subcutaneous Q24H  . hydrALAZINE  25 mg Oral Q8H  . insulin aspart  0-5 Units Subcutaneous QHS  . insulin aspart  0-9 Units Subcutaneous TID WC  . pantoprazole  40 mg Oral BID  .  polyethylene glycol  17 g Oral Daily  . senna-docusate  1 tablet Oral BID  . sodium chloride flush  3 mL Intravenous Q12H  . sodium chloride flush  3 mL Intravenous Q12H   Continuous Infusions: . sodium chloride    . sodium chloride 100 mL/hr at 10/21/16 1115  . sodium chloride    . sodium chloride Stopped (10/19/16 0900)  . sodium chloride 1 mL/kg/hr (10/21/16 1246)   PRN Meds: sodium chloride, sodium chloride, acetaminophen, albuterol, morphine injection, ondansetron (ZOFRAN) IV, sodium chloride flush, sodium chloride flush, zolpidem  Time spent: 30 minutes  Author: Berle Mull, MD [facility] Pager: [phone] 10/21/2016 2:30 PM  If 7PM-7AM, please contact night-coverage at [email or web address], password [facility]

## 2016-10-21 NOTE — Consult Note (Signed)
Referring Provider: No ref. provider found Primary Care Physician:  Nolene Ebbs, MD Primary Nephrologist:     Reason for Consultation:   Chronic renal disease  Acute on chronic renal failure and diabetic nephropathy   HPI:65 y.o. male with PMH significant for HTN, NIDDM, dyslipidemia, and smoking who presented to the ED with two days of malaise, fatigue, and persistent nausea with bouts of emesis. His ECG does not meet STEMI criteria but could represent subendocardial ischemia.  He was to undergo cardiac catheterization after optimizing his renal function. He has had diabetes for 20 years and has had no evidence of retinopathy or peripheral neuropathy. He has not taking any NSAIDS as an outpatient and denies any chronic pain. He has no complaints of shortness of breath and has no leg swelling. He denies any current chest pain. He has had no family history of renal disease It appears that in April 2016, he had a creatinine of 1.3. At that time he was admitted to the hospital with acute renal failure and a creatinine of 2 in association with dehydration and ACE inhibitor use. He has was placed back on the lisinopril as an outpatient and 2 years later was admitted with similar presentation.  He has had good urine output and has had a net weight gain of 3 lbs  No family history of renal disease  CT abdomen revealed no hydronephrosis    Past Medical History:  Diagnosis Date  . Diabetes mellitus without complication ([place])   . Emphysema lung ([place])   . Hyperlipidemia   . Hypertension     History reviewed. No pertinent surgical history.  Prior to Admission medications   Medication Sig Start Date End Date Taking? Authorizing Provider  glimepiride (AMARYL) 4 MG tablet Take 4 mg by mouth daily with breakfast.  09/07/14  Yes [provider]  Insulin Glargine (TOUJEO SOLOSTAR) 300 UNIT/ML SOPN Inject 20 Units into the skin at bedtime.   Yes [provider]  lisinopril  (PRINIVIL,ZESTRIL) 20 MG tablet Take 20 mg by mouth daily.   Yes [provider]  amLODipine (NORVASC) 10 MG tablet Take 1 tablet (10 mg total) by mouth daily. Patient not taking: Reported on 10/16/2016 09/12/14   Reyne Dumas, MD  diphenhydrAMINE (BENADRYL) 25 MG tablet Take 1 tablet (25 mg total) by mouth every 6 (six) hours. Patient not taking: Reported on 09/10/2014 03/07/13   Hazel Sams, PA-C  hydrocortisone cream 1 % Apply to affected area 2 times daily Patient not taking: Reported on 09/10/2014 03/07/13   Hazel Sams, PA-C  promethazine (PHENERGAN) 12.5 MG tablet Take 1 tablet (12.5 mg total) by mouth every 6 (six) hours as needed for nausea. Patient not taking: Reported on 10/16/2016 09/12/14   Reyne Dumas, MD    Current Facility-Administered Medications  Medication Dose Route Frequency Provider Last Rate Last Dose  . [START ON 10/22/2016] 0.9 %  sodium chloride infusion  250 mL Intravenous PRN Martinique, Peter M, MD      . 0.9% sodium chloride infusion  1 mL/kg/hr Intravenous Continuous [place]ique, Peter M, MD 66.9 mL/hr at 10/21/16 1731 1 mL/kg/hr at 10/21/16 1731  . acetaminophen (TYLENOL) tablet 650 mg  650 mg Oral Q6H PRN Ivor Costa, MD      . albuterol (PROVENTIL) (2.5 MG/3ML) 0.083% nebulizer solution 2.5 mg  2.5 mg Nebulization Q4H PRN Ivor Costa, MD      . amLODipine (NORVASC) tablet 10 mg  10 mg Oral Daily Ivor Costa, MD   10 mg  at 10/21/16 0813  . aspirin chewable tablet 81 mg  81 mg Oral Daily Ivor Costa, MD   81 mg at 10/21/16 0813  . carvedilol (COREG) tablet 6.25 mg  6.25 mg Oral BID WC Turner, Traci R, MD   6.25 mg at 10/21/16 1732  . hydrALAZINE (APRESOLINE) tablet 25 mg  25 mg Oral Q8H Domenic Polite, MD   25 mg at 10/21/16 1437  . insulin aspart (novoLOG) injection 0-5 Units  0-5 Units Subcutaneous QHS Ivor Costa, MD      . insulin aspart (novoLOG) injection 0-9 Units  0-9 Units Subcutaneous TID WC Ivor Costa, MD   2 Units at 10/21/16 1732  . morphine 2 MG/ML  injection 2 mg  2 mg Intravenous Q4H PRN Domenic Polite, MD      . ondansetron [facility]) injection 4 mg  4 mg Intravenous Q6H PRN Domenic Polite, MD   4 mg at 10/19/16 0548  . pantoprazole (PROTONIX) EC tablet 40 mg  40 mg Oral BID Domenic Polite, MD   40 mg at 10/21/16 0813  . polyethylene glycol (MIRALAX / GLYCOLAX) packet 17 g  17 g Oral Daily Lavina Hamman, MD   17 g at 10/21/16 0813  . senna-docusate (Senokot-S) tablet 1 tablet  1 tablet Oral BID Lavina Hamman, MD   1 tablet at 10/21/16 0813  . [START ON 10/22/2016] sodium chloride flush (NS) 0.9 % injection 3 mL  3 mL Intravenous Q12H Martinique, Peter M, MD      . Derrill Memo ON 10/22/2016] sodium chloride flush (NS) 0.9 % injection 3 mL  3 mL Intravenous PRN Martinique, Peter M, MD      . zolpidem [facility]) tablet 5 mg  5 mg Oral QHS PRN Ivor Costa, MD        Allergies as of 10/15/2016  . (No Known Allergies)    Family History  Problem Relation Age of Onset  . Diabetes Mellitus II Mother   . Stroke Father   . Hypertension Father   . Diabetes Mellitus II Sister     Social History   Social History  . Marital status: Single    Spouse name: N/A  . Number of children: N/A  . Years of education: N/A   Occupational History  . Not on file.   Social History Main Topics  . Smoking status: Former Smoker    Packs/day: 0.25    Years: 8.00    Types: Cigarettes    Quit date: 37  . Smokeless tobacco: Never Used  . Alcohol use No  . Drug use: No  . Sexual activity: Not on file   Other Topics Concern  . Not on file   Social History Narrative  . No narrative on file    Review of Systems: Gen: Denies any fever, chills, sweats, anorexia, fatigue, weakness, malaise, weight loss, and sleep disorder HEENT: No visual complaints, No history of Retinopathy. Normal external appearance No Epistaxis or Sore throat. No sinusitis.   CV: Denies chest pain, angina, palpitations, syncope, orthopnea, PND, peripheral edema, and claudication. Resp:  Denies dyspnea at rest, dyspnea with exercise, cough, sputum, wheezing, coughing up blood, and pleurisy. GI: Denies vomiting blood, jaundice, and fecal incontinence.   Denies dysphagia or odynophagia. GU : Denies urinary burning, blood in urine, urinary frequency, urinary hesitancy, nocturnal urination, and urinary incontinence.  No renal calculi. MS: Denies joint pain, limitation of movement, and swelling, stiffness, low back pain, extremity pain. Denies muscle weakness, cramps, atrophy.  No use  of non steroidal antiinflammatory drugs. Derm: Denies rash, itching, dry skin, hives, moles, warts, or unhealing ulcers.  Psych: Denies depression, anxiety, memory loss, suicidal ideation, hallucinations, paranoia, and confusion. Heme: Denies bruising, bleeding, and enlarged lymph nodes. Neuro: No headache.  No diplopia. No dysarthria.  No dysphasia.  No history of CVA.  No Seizures. No paresthesias.  No weakness. Endocrine No DM.  No Thyroid disease.  No Adrenal disease.  Physical Exam: Vital signs in last 24 hours: Temp:  [98.1 F (36.7 C)-99.9 F (37.7 C)] 98.1 F (36.7 C) (05/25 1131) Pulse Rate:  [0-138] 71 (05/25 1738) Resp:  [0-28] 15 (05/25 1738) BP: (119-140)/(64-80) 140/77 (05/25 1738) SpO2:  [0 %-95 %] 91 % (05/25 1738) Weight:  [147 lb 8 oz (66.9 kg)] 147 lb 8 oz (66.9 kg) (05/25 0547) Last BM Date: 10/15/16 (per pt) General:   Alert,  Well-developed, well-nourished, pleasant and cooperative in NAD Head:  Normocephalic and atraumatic. Eyes:  Sclera clear, no icterus.   Conjunctiva pink. Ears:  Normal auditory acuity. Nose:  No deformity, discharge,  or lesions. Mouth:  No deformity or lesions, dentition normal. Neck:  Supple; no masses or thyromegaly. JVP not elevated Lungs:  Clear throughout to auscultation.   No wheezes, crackles, or rhonchi. No acute distress. Heart:  Regular rate and rhythm; no murmurs, clicks, rubs,  or gallops. Abdomen:  Soft, nontender and nondistended. No  masses, hepatosplenomegaly or hernias noted. Normal bowel sounds, without guarding, and without rebound.   Msk:  Symmetrical without gross deformities. Normal posture. Pulses:  No carotid, renal, femoral bruits. DP and PT symmetrical and equal Extremities:  Without clubbing or edema. Neurologic:  Alert and  oriented x4;  grossly normal neurologically. Skin:  Intact without significant lesions or rashes. Cervical Nodes:  No significant cervical adenopathy. Psych:  Alert and cooperative. Normal mood and affect.  Intake/Output from previous day: 05/24 0701 - 05/25 0700 In: 240 [P.O.:240] Out: 1225 [Urine:1225] Intake/Output this shift: Total I/O In: 240 [P.O.:240] Out: -   Lab Results:  Recent Labs  10/19/16 0254 10/20/16 0340 10/21/16 0323  WBC 11.9* 12.1* 9.8  HGB 11.4* 11.4* 11.5*  HCT 34.5* 34.7* 34.2*  PLT 271 278 253   BMET  Recent Labs  10/19/16 1412 10/20/16 0340 10/21/16 0323  NA 139  138 135 132*  K 3.8  3.7 3.8 3.6  CL 108  107 105 104  CO2 21*  21* 19* 20*  GLUCOSE 144*  144* 172* 172*  BUN 48*  48* 48* 52*  CREATININE 2.00*  2.02* 1.93* 2.05*  CALCIUM 8.8*  8.9 8.3* 8.0*  PHOS  --  3.6 2.4*   LFT  Recent Labs  10/19/16 0254  10/21/16 0323  PROT 6.3*  --   --   ALBUMIN 3.0*  < > 2.5*  AST 44*  --   --   ALT 31  --   --   ALKPHOS 55  --   --   BILITOT 0.8  --   --   < > = values in this interval not displayed. PT/INR  Recent Labs  10/19/16 0254  LABPROT 15.4*  INR 1.22   Hepatitis Panel No results for input(s): HEPBSAG, HCVAB, HEPAIGM, HEPBIGM in the last 72 hours.  Studies/Results: No results found.  Assessment/Plan:  CKD stage 3 it appears that the renal function is about 1.3 to 2 mg/dl  At baseline with a mildly decreased GFR. He most likely has some nephrosclerosis as the urinalysis is benign.  I do not think serological work up is needed. He may benefit from SPEP and UPEP although there is no evidence of myeloma on  clinical presentation. A pr /cr ratio is reasonable.  AKI  This is most likely hemodynamic as in 2016, he is at risk of contrast associated nephropathy but not likely to need dialysis. I explained the risks and benefits to the patient who articulated them back to me and appears to have reasonably good understanding  Anemia stable no need for ESA  Bones  Would recommend PTH Thank you for the consult   LOS: 5 Antwaine Boomhower W  [phone] @TODAY @6 :19 PM

## 2016-10-22 LAB — CBC
HCT: 30.6 % — ABNORMAL LOW (ref 39.0–52.0)
Hemoglobin: 10.2 g/dL — ABNORMAL LOW (ref 13.0–17.0)
MCH: 29 pg (ref 26.0–34.0)
MCHC: 33.3 g/dL (ref 30.0–36.0)
MCV: 86.9 fL (ref 78.0–100.0)
PLATELETS: 249 10*3/uL (ref 150–400)
RBC: 3.52 MIL/uL — AB (ref 4.22–5.81)
RDW: 12.5 % (ref 11.5–15.5)
WBC: 7.7 10*3/uL (ref 4.0–10.5)

## 2016-10-22 LAB — HEPARIN LEVEL (UNFRACTIONATED)
HEPARIN UNFRACTIONATED: 0.45 [IU]/mL (ref 0.30–0.70)
HEPARIN UNFRACTIONATED: 0.64 [IU]/mL (ref 0.30–0.70)
Heparin Unfractionated: 0.14 IU/mL — ABNORMAL LOW (ref 0.30–0.70)

## 2016-10-22 LAB — GLUCOSE, CAPILLARY
GLUCOSE-CAPILLARY: 184 mg/dL — AB (ref 65–99)
GLUCOSE-CAPILLARY: 187 mg/dL — AB (ref 65–99)
Glucose-Capillary: 227 mg/dL — ABNORMAL HIGH (ref 65–99)

## 2016-10-22 LAB — RENAL FUNCTION PANEL
ALBUMIN: 2.3 g/dL — AB (ref 3.5–5.0)
Anion gap: 8 (ref 5–15)
BUN: 54 mg/dL — AB (ref 6–20)
CALCIUM: 7.9 mg/dL — AB (ref 8.9–10.3)
CO2: 19 mmol/L — ABNORMAL LOW (ref 22–32)
CREATININE: 2.07 mg/dL — AB (ref 0.61–1.24)
Chloride: 105 mmol/L (ref 101–111)
GFR, EST AFRICAN AMERICAN: 36 mL/min — AB (ref >60)
GFR, EST NON AFRICAN AMERICAN: 31 mL/min — AB (ref >60)
Glucose, Bld: 193 mg/dL — ABNORMAL HIGH (ref 65–99)
Phosphorus: 2.5 mg/dL (ref 2.5–4.6)
Potassium: 3.7 mmol/L (ref 3.5–5.1)
SODIUM: 132 mmol/L — AB (ref 135–145)

## 2016-10-22 LAB — PARATHYROID HORMONE, INTACT (NO CA): PTH: 86 pg/mL — AB (ref 15–65)

## 2016-10-22 NOTE — Progress Notes (Signed)
[facility] ROUNDING NOTE   Subjective:   Interval History:   HPI:65 y.o.malewith PMH significant for HTN, NIDDM, dyslipidemia, and smoking who presented to the ED with two days of malaise, fatigue, and persistent nausea with bouts of emesis. His ECG does not meet STEMI criteria but could represent subendocardial ischemia.  He was to undergo cardiac catheterization after optimizing his renal function. He has had diabetes for 20 years and has had no evidence of retinopathy or peripheral neuropathy. He has not taking any NSAIDS as an outpatient and denies any chronic pain. He has no complaints of shortness of breath and has no leg swelling. He denies any current chest pain. He has had no family history of renal disease It appears that in April 2016, he had a creatinine of 1.3. At that time he was admitted to the hospital with acute renal failure and a creatinine of 2 in association with dehydration and ACE inhibitor use. He has was placed back on the lisinopril as an outpatient and 2 years later was admitted with similar presentation.  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 -- revascularization contemplated  5/25     Objective:  Vital signs in last 24 hours:  Temp:  [97.8 F (36.6 C)-99.1 F (37.3 C)] 97.8 F (36.6 C) (05/26 0853) Pulse Rate:  [0-138] 76 (05/26 0853) Resp:  [0-21] 18 (05/26 0853) BP: (119-143)/(65-80) 143/75 (05/26 0853) SpO2:  [0 %-95 %] 92 % (05/26 0853) Weight:  [149 lb 6.4 oz (67.8 kg)] 149 lb 6.4 oz (67.8 kg) (05/26 0412)  Weight change: 1 lb 14.4 oz (0.862 kg) Filed Weights   10/20/16 0500 10/21/16 0547 10/22/16 0412  Weight: 144 lb 3.2 oz (65.4 kg) 147 lb 8 oz (66.9 kg) 149 lb 6.4 oz (67.8 kg)    Intake/Output: I/O last 3 completed shifts: In: 989.6 [P.O.:240; I.V.:749.6] Out:  850 [Urine:850]   Intake/Output this shift:  Total I/O In: -  Out: 400 [Urine:400]  CVS- RRR RS- CTA ABD- BS present soft non-distended EXT- no edema   Basic Metabolic Panel:  Recent Labs Lab 10/19/16 0254 10/19/16 1412 10/20/16 0340 10/21/16 0323 10/22/16 0243  NA 135 139  138 135 132* 132*  K 4.0 3.8  3.7 3.8 3.6 3.7  CL 103 108  107 105 104 105  CO2 22 21*  21* 19* 20* 19*  GLUCOSE 180* 144*  144* 172* 172* 193*  BUN 41* 48*  48* 48* 52* 54*  CREATININE 1.91* 2.00*  2.02* 1.93* 2.05* 2.07*  CALCIUM 8.7* 8.8*  8.9 8.3* 8.0* 7.9*  MG  --   --  2.2  --   --   PHOS  --   --  3.6 2.4* 2.5    Liver Function Tests:  Recent Labs Lab 10/15/16 2329 10/16/16 0728 10/17/16 0450 10/19/16 0254 10/20/16 0340 10/21/16 0323 10/22/16 0243  AST 48* 31 38 44*  --   --   --   ALT 27 20 24 31   --   --   --   ALKPHOS 62 59 57 55  --   --   --   BILITOT 1.4* 0.7 0.7 0.8  --   --   --   PROT 7.2 6.2* 6.1* 6.3*  --   --   --   ALBUMIN 3.7  3.2* 3.1* 3.0* 3.0* 2.5* 2.3*    Recent Labs Lab 10/15/16 2329 10/19/16 0254  LIPASE 18 16   No results for input(s): AMMONIA in the last 168 hours.  CBC:  Recent Labs Lab 10/18/16 0331 10/19/16 0254 10/20/16 0340 10/21/16 0323 10/22/16 0243  WBC 10.3 11.9* 12.1* 9.8 7.7  HGB 11.1* 11.4* 11.4* 11.5* 10.2*  HCT 33.9* 34.5* 34.7* 34.2* 30.6*  MCV 87.6 88.2 87.8 86.8 86.9  PLT 285 271 278 253 249    Cardiac Enzymes:  Recent Labs Lab 10/16/16 0217 10/16/16 0512 10/16/16 1014 10/16/16 1450  TROPONINI 0.69* 0.51* 0.55* 0.59*    BNP: Invalid input(s): POCBNP  CBG:  Recent Labs Lab 10/21/16 0737 10/21/16 1115 10/21/16 1639 10/21/16 2113 10/22/16 0752  GLUCAP 163* 159* 156* 195* 184*    Microbiology: Results for orders placed or performed during the hospital encounter of 10/15/16  MRSA PCR Screening     Status: None   Collection Time: 10/16/16  5:11 AM  Result Value Ref Range Status   MRSA by PCR  NEGATIVE NEGATIVE Final    Comment:        The GeneXpert MRSA Assay (FDA approved for NASAL specimens only), is one component of a comprehensive MRSA colonization surveillance program. It is not intended to diagnose MRSA infection nor to guide or monitor treatment for MRSA infections.   Culture, Urine     Status: None   Collection Time: 10/19/16  2:05 PM  Result Value Ref Range Status   Specimen Description URINE, CLEAN CATCH  Final   Special Requests NONE  Final   Culture NO GROWTH  Final   Report Status 10/20/2016 FINAL  Final  Culture, blood (routine x 2)     Status: None (Preliminary result)   Collection Time: 10/19/16  2:22 PM  Result Value Ref Range Status   Specimen Description BLOOD LEFT HAND  Final   Special Requests IN PEDIATRIC BOTTLE Blood Culture adequate volume  Final   Culture NO GROWTH 3 DAYS  Final   Report Status PENDING  Incomplete  Culture, blood (routine x 2)     Status: None (Preliminary result)   Collection Time: 10/19/16  2:27 PM  Result Value Ref Range Status   Specimen Description BLOOD RIGHT ARM  Final   Special Requests IN PEDIATRIC BOTTLE Blood Culture adequate volume  Final   Culture NO GROWTH 3 DAYS  Final   Report Status PENDING  Incomplete    Coagulation Studies: No results for input(s): LABPROT, INR in the last 72 hours.  Urinalysis:  Recent Labs  10/19/16 1357  COLORURINE YELLOW  LABSPEC 1.020  PHURINE 5.0  GLUCOSEU 50*  HGBUR NEGATIVE  BILIRUBINUR NEGATIVE  KETONESUR 5*  PROTEINUR 100*  NITRITE NEGATIVE  LEUKOCYTESUR NEGATIVE      Imaging: No results found.   Medications:   . sodium chloride    . heparin 900 Units/hr (10/22/16 0117)   . amLODipine  10 mg Oral Daily  . aspirin  81 mg Oral Daily  . carvedilol  6.25 mg Oral BID WC  . hydrALAZINE  25 mg Oral Q8H  . insulin aspart  0-5 Units Subcutaneous QHS  . insulin aspart  0-9 Units Subcutaneous TID WC  . pantoprazole  40 mg Oral BID  . polyethylene glycol  17  g Oral Daily  . senna-docusate  1 tablet Oral BID  . sodium chloride flush  3 mL Intravenous Q12H   sodium chloride, acetaminophen, albuterol, morphine injection, ondansetron (ZOFRAN) IV,  sodium chloride flush, zolpidem  Assessment/ Plan:   CKD stage 3 it appears that the renal function is about 1.3 to 2 mg/dl  At baseline with a mildly decreased GFR. He most likely has some nephrosclerosis as the urinalysis is benign. I do not think serological work up is needed. He may benefit from SPEP and UPEP although there is no evidence of myeloma on clinical presentation. A pr /cr ratio is reasonable.  AKI  This is most likely hemodynamic as in 2016, he is at risk of contrast associated nephropathy but not likely to need dialysis. I explained the risks and benefits to the patient who articulated them back to me and appears to have reasonably good understanding. Fortunately creatinine stable this am   Anemia stable no need for ESA  Bones  Would recommend PTH  Patient with critical disease and revascularization being contemplated    LOS: 6 Shawn Collins W @TODAY @11 :36 AM

## 2016-10-22 NOTE — Progress Notes (Signed)
ANTICOAGULATION CONSULT NOTE - Follow-Up Consult  Pharmacy Consult for heparin Indication: chest pain/ACS  No Known Allergies  Patient Measurements: Height: 5\' 10"  (177.8 cm) Weight: 149 lb 6.4 oz (67.8 kg) IBW/kg (Calculated) : 73 Heparin Dosing Weight: 67kg  Vital Signs: Temp: 97.8 F (36.6 C) (05/26 0853) Temp Source: Oral (05/26 0853) BP: 143/75 (05/26 0853) Pulse Rate: 76 (05/26 0853)  Labs:  Recent Labs  10/20/16 0340 10/21/16 0323 10/22/16 0243 10/22/16 0907  HGB 11.4* 11.5* 10.2*  --   HCT 34.7* 34.2* 30.6*  --   PLT 278 253 249  --   HEPARINUNFRC  --   --  0.14* 0.45  CREATININE 1.93* 2.05* 2.07*  --     Estimated Creatinine Clearance: 33.2 mL/min (A) (by C-G formula based on SCr of 2.07 mg/dL (H)).   Medical History: Past Medical History:  Diagnosis Date  . Diabetes mellitus without complication ([place])   . Emphysema lung ([place])   . Hyperlipidemia   . Hypertension      Assessment: 61 yoM with multiple cardiac risk factors who presents to the emergency department with a 2 day history of malaise, fatigue and persistent nausea with bouts of emesis. 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 Cardiac surgery has been consulted. Heparin resumed early am 5/26 and therapeutic x1 at 0.45. Hgb slightly down trending, plt stable.  Goal of Therapy:  Heparin level 0.3-0.7 units/ml Monitor platelets by anticoagulation protocol: Yes   Plan:  -Continue heparin 900 units/hr -Confirmatory heparin level at 1600 -Monitor daily heparin level, CBC, S/Sx bleeding -F/U plans for CABG  Arrie Senate, PharmD PGY-1 Pharmacy Resident Pager: [phone] 10/22/2016

## 2016-10-22 NOTE — Progress Notes (Signed)
Progress Note  Patient Name: Shawn Collins Date of Encounter: 10/22/2016  Primary Cardiologist: Dr. Radford Pax  Subjective   Doing well today Denies CP or SOB  Inpatient Medications    Scheduled Meds: . amLODipine  10 mg Oral Daily  . aspirin  81 mg Oral Daily  . carvedilol  6.25 mg Oral BID WC  . hydrALAZINE  25 mg Oral Q8H  . insulin aspart  0-5 Units Subcutaneous QHS  . insulin aspart  0-9 Units Subcutaneous TID WC  . pantoprazole  40 mg Oral BID  . polyethylene glycol  17 g Oral Daily  . senna-docusate  1 tablet Oral BID  . sodium chloride flush  3 mL Intravenous Q12H   Continuous Infusions: . sodium chloride    . heparin 900 Units/hr (10/22/16 0117)   PRN Meds: sodium chloride, acetaminophen, albuterol, morphine injection, ondansetron (ZOFRAN) IV, sodium chloride flush, zolpidem   Vital Signs    Vitals:   10/21/16 2038 10/22/16 0035 10/22/16 0412 10/22/16 0853  BP: 126/70 138/73 (!) 143/77 (!) 143/75  Pulse: 64 68 72 76  Resp: 14 12 11 18   Temp: 97.8 F (36.6 C) 99.1 F (37.3 C) 98.7 F (37.1 C) 97.8 F (36.6 C)  TempSrc: Axillary Oral Oral Oral  SpO2: 93% 94% 94% 92%  Weight:   149 lb 6.4 oz (67.8 kg)   Height:        Intake/Output Summary (Last 24 hours) at 10/22/16 0955 Last data filed at 10/22/16 0720  Gross per 24 hour  Intake           749.56 ml  Output              400 ml  Net           349.56 ml   Filed Weights   10/20/16 0500 10/21/16 0547 10/22/16 0412  Weight: 144 lb 3.2 oz (65.4 kg) 147 lb 8 oz (66.9 kg) 149 lb 6.4 oz (67.8 kg)    Telemetry    Sinus rhythm  Physical Exam   Physical Exam: Vitals:   10/21/16 2038 10/22/16 0035 10/22/16 0412 10/22/16 0853  BP: 126/70 138/73 (!) 143/77 (!) 143/75  Pulse: 64 68 72 76  Resp: 14 12 11 18   Temp: 97.8 F (36.6 C) 99.1 F (37.3 C) 98.7 F (37.1 C) 97.8 F (36.6 C)  TempSrc: Axillary Oral Oral Oral  SpO2: 93% 94% 94% 92%  Weight:   149 lb 6.4 oz (67.8 kg)   Height:        GEN-  The patient is well appearing, alert and oriented x 3 today.   Head- normocephalic, atraumatic Eyes-  Sclera clear, conjunctiva pink Ears- hearing intact Oropharynx- clear Neck- supple, Lungs- Clear to ausculation bilaterally, normal work of breathing Heart- Regular rate and rhythm, no murmurs, rubs or gallops, PMI not laterally displaced GI- soft, NT, ND, + BS Extremities- no clubbing, cyanosis, or edema, groin is without hematoma/ bruit MS- no significant deformity or atrophy Skin- no rash or lesion Psych- euthymic mood, full affect Neuro- strength and sensation are intact   Labs    Chemistry Recent Labs Lab 10/16/16 0728 10/17/16 0450  10/19/16 0254  10/20/16 0340 10/21/16 0323 10/22/16 0243  NA 134* 135  < > 135  < > 135 132* 132*  K 4.7 4.2  < > 4.0  < > 3.8 3.6 3.7  CL 103 104  < > 103  < > 105 104 105  CO2 21* 22  < >  22  < > 19* 20* 19*  GLUCOSE 257* 120*  < > 180*  < > 172* 172* 193*  BUN 19 25*  < > 41*  < > 48* 52* 54*  CREATININE 1.31* 1.56*  < > 1.91*  < > 1.93* 2.05* 2.07*  CALCIUM 8.8* 8.5*  < > 8.7*  < > 8.3* 8.0* 7.9*  PROT 6.2* 6.1*  --  6.3*  --   --   --   --   ALBUMIN 3.2* 3.1*  --  3.0*  --  3.0* 2.5* 2.3*  AST 31 38  --  44*  --   --   --   --   ALT 20 24  --  31  --   --   --   --   ALKPHOS 59 57  --  55  --   --   --   --   BILITOT 0.7 0.7  --  0.8  --   --   --   --   GFRNONAA 55* 44*  < > 35*  < > 34* 32* 31*  GFRAA >60 51*  < > 40*  < > 40* 37* 36*  ANIONGAP 10 9  < > 10  < > 11 8 8   < > = values in this interval not displayed.   Hematology  Recent Labs Lab 10/20/16 0340 10/21/16 0323 10/22/16 0243  WBC 12.1* 9.8 7.7  RBC 3.95* 3.94* 3.52*  HGB 11.4* 11.5* 10.2*  HCT 34.7* 34.2* 30.6*  MCV 87.8 86.8 86.9  MCH 28.9 29.2 29.0  MCHC 32.9 33.6 33.3  RDW 12.5 12.3 12.5  PLT 278 253 249    Cardiac Enzymes  Recent Labs Lab 10/16/16 0217 10/16/16 0512 10/16/16 1014 10/16/16 1450  TROPONINI 0.69* 0.51* 0.55* 0.59*      Recent Labs Lab 10/15/16 2343  TROPIPOC 0.35*     BNPNo results for input(s): BNP, PROBNP in the last 168 hours.   DDimer No results for input(s): DDIMER in the last 168 hours.   Patient Profile     67 y.o. male with PMH significant for HTN, NIDDM, dyslipidemia, and smoking who presented to the ED with two days of malaise, fatigue, and persistent nausea with bouts of emesis. His ECG does not meet STEMI criteria but could represent subendocardial ischemia.   Assessment & Plan    1. CAD/ NSTEMI Cath reviewed Cardiac surgery consult pending Continue IV heparin and close observation for now   2. HTN Stable No change required today  3. AKI Stable No change required today Nephrology is following  4. Ischemic CM Will need ongoing medical titration after revascularization    Signed, Thompson Grayer, MD  10/22/2016, 9:55 AM

## 2016-10-22 NOTE — Progress Notes (Signed)
[facility] Progress Note  Patient: Shawn Collins TGP:[id number]   PCP: Nolene Ebbs, MD DOB: 1950/07/26   DOA: 10/15/2016   DOS: 10/22/2016   Date of Service: the patient was seen and examined on 10/22/2016  Subjective: Somewhat swollen upper extremities are seen. No acute events overnight. Tolerated cardiac cath, urinating well. Eating well.  Brief hospital course: Pt. with PMH of type II DM, HTN, dyslipidemia; admitted on 10/15/2016, presented with complaint of  nausea vomiting, was found to have viral gastroenteritis and NSTEMI. Currently further plan is follow renal function after cardiac cath.  Assessment and Plan: 1. Nausea vomiting and abdominal pain. Likely viral gastroenteritis. Possibility of gastroparesis CT abdomen pelvis unremarkable, x-ray abdomen unremarkable. Right upper quadrant ultrasound shows Dilated CBD which is unchanged from prior ultrasound. LFT stable no indication for acute intervention. Continue supportive measures, placing on scheduled Reglan. Stool softener.  2. Non-STEMI. Abnormal stress test. Troponins are trending downwards. Could be contributing to patient's nausea and vomiting. Cardiology consulted, S/P cardiac catheterization- severe triple-vessel disease, patient will need bypass surgery, thoracic onboard. although serum creatinine worsening prior to cath, now stable.  Cardiology would like to continue IV hydration. And plan cath. Continue on amlodipine, carvedilol, no indication for diuretics at present. Echo 60-65% EF. Discussed with nephrology, patient's chances of developing severe renal dysfunction and going on hemodialysis is 1 out of 10.  3. Acute on chronic kidney disease stage IV. Renal function progressively worsening. Now stable. Due to poor by mouth intake as well as continues use of lisinopril.  Avoid any further nephrotoxic medication, continue IV hydration. Patient is going to receive contrast for cardiac catheterization  which may worsen renal function. Urine output remains adequate, patient actually negative by ins and outs calculation although unsure whether they are accurate. Blood pressure was actually elevated. Nephrology consulted for further input  Recommendation I would check SPEP UPEP and protein creatinine ratio. CT scan does not show any obstruction but does show perinephric edema which is unchanged from prior years. UA x2 bland.  4. Type II DM.  Uncontrolled. Hyperglycemia. On Amaryl at home, hemoglobin A1c 10.1. Continue sliding scale insulin, Oral hypoglycemic agents as well as long-acting insulin is currently on hold due to poor by mouth intake. diabetic gastroparesis causing nausea and vomiting cannot be ruled out.  5.Fever with chills. Extensive workup performed, urine culture unremarkable  urine analysis unremarkable.  CT abdomen pelvis unremarkable other than perinephric edema but no evidence of UTI. X-ray chest unremarkable. Ultrasound abdomen unremarkable. ESR, CRP, Pro calcitonin level unremarkable. Blood culture negative for 2 days, no indication for further workup or treatment.  6. Bilateral basal crackles. Likely mild fluid volume overload. Patient is going to get incentive spirometry. Recommend to ambulate as well as stay upright. We will reevaluate tomorrow he remains volume overloaded.  Diet: Cardiac and carb modified diet DVT Prophylaxis: subcutaneous Heparin  Advance goals of care discussion: Full code  Family Communication: no family was present at bedside, at the time of interview.   Disposition:  Discharge to home with home health.  Consultants: cardiology nephrology  Procedures: Echocardiogram, stress test  Antibiotics: Anti-infectives    None       Objective: Physical Exam: Vitals:   10/22/16 0035 10/22/16 0412 10/22/16 0853 10/22/16 1359  BP: 138/73 (!) 143/77 (!) 143/75 124/69  Pulse: 68 72 76 71  Resp: '12 11 18 18  ' Temp: 99.1 F (37.3 C)  98.7 F (37.1 C) 97.8 F (36.6 C) 98.1 F (36.7 C)  TempSrc:  Oral Oral Oral Oral  SpO2: 94% 94% 92% 94%  Weight:  67.8 kg (149 lb 6.4 oz)    Height:        Intake/Output Summary (Last 24 hours) at 10/22/16 1706 Last data filed at 10/22/16 1359  Gross per 24 hour  Intake           989.56 ml  Output              600 ml  Net           389.56 ml   Filed Weights   10/20/16 0500 10/21/16 0547 10/22/16 0412  Weight: 65.4 kg (144 lb 3.2 oz) 66.9 kg (147 lb 8 oz) 67.8 kg (149 lb 6.4 oz)   General: Alert, Awake and Oriented to Time, Place and Person. Appear in mild distress, affect appropriate Eyes: PERRL, Conjunctiva normal ENT: Oral Mucosa clear moist. Neck: no JVD, no Abnormal Mass Or lumps Cardiovascular: S1 and S2 Present, no Murmur, Peripheral Pulses Present Respiratory: Bilateral Air entry equal and Decreased, no use of accessory muscle, bilateral Crackles, no wheezes Abdomen: Bowel Sound present, Soft and no tenderness Skin: redness no, no Rash, no induration Extremities: no Pedal edema, no calf tenderness Neurologic: Grossly no focal neuro deficit. Bilaterally Equal motor strength  Data Reviewed: CBC:  Recent Labs Lab 10/18/16 0331 10/19/16 0254 10/20/16 0340 10/21/16 0323 10/22/16 0243  WBC 10.3 11.9* 12.1* 9.8 7.7  HGB 11.1* 11.4* 11.4* 11.5* 10.2*  HCT 33.9* 34.5* 34.7* 34.2* 30.6*  MCV 87.6 88.2 87.8 86.8 86.9  PLT 285 271 278 253 009   Basic Metabolic Panel:  Recent Labs Lab 10/19/16 0254 10/19/16 1412 10/20/16 0340 10/21/16 0323 10/22/16 0243  NA 135 139  138 135 132* 132*  K 4.0 3.8  3.7 3.8 3.6 3.7  CL 103 108  107 105 104 105  CO2 22 21*  21* 19* 20* 19*  GLUCOSE 180* 144*  144* 172* 172* 193*  BUN 41* 48*  48* 48* 52* 54*  CREATININE 1.91* 2.00*  2.02* 1.93* 2.05* 2.07*  CALCIUM 8.7* 8.8*  8.9 8.3* 8.0* 7.9*  MG  --   --  2.2  --   --   PHOS  --   --  3.6 2.4* 2.5    Liver Function Tests:  Recent Labs Lab 10/15/16 2329  10/16/16 0728 10/17/16 0450 10/19/16 0254 10/20/16 0340 10/21/16 0323 10/22/16 0243  AST 48* 31 38 44*  --   --   --   ALT '27 20 24 31  ' --   --   --   ALKPHOS 62 59 57 55  --   --   --   BILITOT 1.4* 0.7 0.7 0.8  --   --   --   PROT 7.2 6.2* 6.1* 6.3*  --   --   --   ALBUMIN 3.7 3.2* 3.1* 3.0* 3.0* 2.5* 2.3*    Recent Labs Lab 10/15/16 2329 10/19/16 0254  LIPASE 18 16   No results for input(s): AMMONIA in the last 168 hours. Coagulation Profile:  Recent Labs Lab 10/19/16 0254  INR 1.22   Cardiac Enzymes:  Recent Labs Lab 10/16/16 0217 10/16/16 0512 10/16/16 1014 10/16/16 1450  TROPONINI 0.69* 0.51* 0.55* 0.59*   BNP (last 3 results) No results for input(s): PROBNP in the last 8760 hours. CBG:  Recent Labs Lab 10/21/16 1115 10/21/16 1639 10/21/16 2113 10/22/16 0752 10/22/16 1200  GLUCAP 159* 156* 195* 184* 187*   Studies: No results  found.  Scheduled Meds: . amLODipine  10 mg Oral Daily  . aspirin  81 mg Oral Daily  . carvedilol  6.25 mg Oral BID WC  . hydrALAZINE  25 mg Oral Q8H  . insulin aspart  0-5 Units Subcutaneous QHS  . insulin aspart  0-9 Units Subcutaneous TID WC  . pantoprazole  40 mg Oral BID  . polyethylene glycol  17 g Oral Daily  . senna-docusate  1 tablet Oral BID  . sodium chloride flush  3 mL Intravenous Q12H   Continuous Infusions: . sodium chloride    . heparin 900 Units/hr (10/22/16 0117)   PRN Meds: sodium chloride, acetaminophen, albuterol, morphine injection, ondansetron (ZOFRAN) IV, sodium chloride flush, zolpidem  Time spent: 30 minutes  Author: Berle Mull, MD [facility] Pager: [phone] 10/22/2016 5:06 PM  If 7PM-7AM, please contact night-coverage at [email or web address], password [email or web address]

## 2016-10-22 NOTE — Progress Notes (Signed)
CARDIAC REHAB PHASE I   PRE:  Rate/Rhythm: 72 sR    BP: sitting 124/69    SaO2: 94 2L  MODE:  Ambulation: 240 ft   POST:  Rate/Rhythm: 86 SR    BP: sitting 126/80     SaO2: 94 2L  Pt in recliner. Denies c/o except feeling full and lack of appetite (ate breakfast but only a few bites of lunch). Able to stand with mod assist and walk with RW and 2L O2. No major c/o, slow and steady pace. Return to recliner. Sts his stomach feels tight and he hasn't had BM in days. Notified RN. VSS. Gave [facility] booklet, video, careguide. Discussed sternal precautions, mobility, d/c planning (pt open to the idea of SNF), and IS. Gave IS and pt only able to do 250 mL. Highly encouraged more use over weekend. Voiced understanding. Will f/u Tuesday. [place], ACSM 10/22/2016 2:30 PM

## 2016-10-22 NOTE — Progress Notes (Signed)
ANTICOAGULATION CONSULT NOTE - Follow-Up Consult  Pharmacy Consult for heparin Indication: chest pain/ACS  No Known Allergies  Patient Measurements: Height: 5\' 10"  (177.8 cm) Weight: 149 lb 6.4 oz (67.8 kg) IBW/kg (Calculated) : 73 Heparin Dosing Weight: 67kg  Vital Signs: Temp: 98.1 F (36.7 C) (05/26 1359) Temp Source: Oral (05/26 1359) BP: 124/69 (05/26 1359) Pulse Rate: 71 (05/26 1359)  Labs:  Recent Labs  10/20/16 0340 10/21/16 0323 10/22/16 0243 10/22/16 0907 10/22/16 1526  HGB 11.4* 11.5* 10.2*  --   --   HCT 34.7* 34.2* 30.6*  --   --   PLT 278 253 249  --   --   HEPARINUNFRC  --   --  0.14* 0.45 0.64  CREATININE 1.93* 2.05* 2.07*  --   --     Estimated Creatinine Clearance: 33.2 mL/min (A) (by C-G formula based on SCr of 2.07 mg/dL (H)).   Medical History: Past Medical History:  Diagnosis Date  . Diabetes mellitus without complication ([place])   . Emphysema lung ([place])   . Hyperlipidemia   . Hypertension      Assessment: 77 yoM with multiple cardiac risk factors who presents to the emergency department with a 2 day history of malaise, fatigue and persistent nausea with bouts of emesis. 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 Cardiac surgery has been consulted. Heparin resumed early am 5/26 and remains therapeutic. No bleeding noted.   Goal of Therapy:  Heparin level 0.3-0.7 units/ml Monitor platelets by anticoagulation protocol: Yes   Plan:  -Continue heparin 900 units/hr -Monitor daily heparin level, CBC, S/Sx bleeding -F/U plans for CABG  Salome Arnt, PharmD, BCPS Pager # [phone] 10/22/2016 4:46 PM

## 2016-10-22 NOTE — Plan of Care (Signed)
Problem: Bowel/Gastric: Goal: Will not experience complications related to bowel motility Outcome: Progressing Administered stool softener.

## 2016-10-23 ENCOUNTER — Inpatient Hospital Stay (HOSPITAL_COMMUNITY): Payer: Medicare Other

## 2016-10-23 DIAGNOSIS — Z01810 Encounter for preprocedural cardiovascular examination: Secondary | ICD-10-CM

## 2016-10-23 LAB — CBC
HEMATOCRIT: 31.7 % — AB (ref 39.0–52.0)
Hemoglobin: 10.5 g/dL — ABNORMAL LOW (ref 13.0–17.0)
MCH: 28.5 pg (ref 26.0–34.0)
MCHC: 33.1 g/dL (ref 30.0–36.0)
MCV: 86.1 fL (ref 78.0–100.0)
PLATELETS: 267 10*3/uL (ref 150–400)
RBC: 3.68 MIL/uL — AB (ref 4.22–5.81)
RDW: 12.3 % (ref 11.5–15.5)
WBC: 7.1 10*3/uL (ref 4.0–10.5)

## 2016-10-23 LAB — RENAL FUNCTION PANEL
Albumin: 2.3 g/dL — ABNORMAL LOW (ref 3.5–5.0)
Anion gap: 9 (ref 5–15)
BUN: 53 mg/dL — ABNORMAL HIGH (ref 6–20)
CHLORIDE: 105 mmol/L (ref 101–111)
CO2: 17 mmol/L — AB (ref 22–32)
CREATININE: 2.14 mg/dL — AB (ref 0.61–1.24)
Calcium: 8 mg/dL — ABNORMAL LOW (ref 8.9–10.3)
GFR, EST AFRICAN AMERICAN: 35 mL/min — AB (ref >60)
GFR, EST NON AFRICAN AMERICAN: 30 mL/min — AB (ref >60)
Glucose, Bld: 197 mg/dL — ABNORMAL HIGH (ref 65–99)
POTASSIUM: 3.7 mmol/L (ref 3.5–5.1)
Phosphorus: 2.9 mg/dL (ref 2.5–4.6)
Sodium: 131 mmol/L — ABNORMAL LOW (ref 135–145)

## 2016-10-23 LAB — HEPARIN LEVEL (UNFRACTIONATED)
HEPARIN UNFRACTIONATED: 0.79 [IU]/mL — AB (ref 0.30–0.70)
Heparin Unfractionated: 0.7 IU/mL (ref 0.30–0.70)
Heparin Unfractionated: 0.53 IU/mL (ref 0.30–0.70)

## 2016-10-23 LAB — PROCALCITONIN: PROCALCITONIN: 0.2 ng/mL

## 2016-10-23 LAB — GLUCOSE, CAPILLARY
GLUCOSE-CAPILLARY: 182 mg/dL — AB (ref 65–99)
Glucose-Capillary: 213 mg/dL — ABNORMAL HIGH (ref 65–99)
Glucose-Capillary: 204 mg/dL — ABNORMAL HIGH (ref 65–99)
Glucose-Capillary: 249 mg/dL — ABNORMAL HIGH (ref 65–99)

## 2016-10-23 MED ORDER — AMLODIPINE BESYLATE 5 MG PO TABS
5.0000 mg | ORAL_TABLET | Freq: Every day | ORAL | Status: DC
  Administered 2016-10-24 – 2016-10-25 (×2): 5 mg via ORAL
  Filled 2016-10-23 (×2): qty 1

## 2016-10-23 MED ORDER — POLYETHYLENE GLYCOL 3350 17 G PO PACK: 17.0000 g | PACK | Freq: Every day | ORAL | Status: DC | PRN

## 2016-10-23 NOTE — Progress Notes (Signed)
[facility] Progress Note  Patient: Shawn Collins [id number]:[id number]   PCP: Nolene Ebbs, MD DOB: February 12, 1951   DOA: 10/15/2016   DOS: 10/23/2016   Date of Service: the patient was seen and examined on 10/23/2016  Subjective: Patient with cough. Mild shortness of breath. No chest pain. No nausea no vomiting. No orthopnea. No diarrhea no constipation.  Brief hospital course: Pt. with PMH of type II DM, HTN, dyslipidemia; admitted on 10/15/2016, presented with complaint of  nausea vomiting, was found to have viral gastroenteritis and NSTEMI. Currently further plan is follow renal function after cardiac cath.  Assessment and Plan: 1. Nausea vomiting and abdominal pain. Likely viral gastroenteritis. Possibility of gastroparesis CT abdomen pelvis unremarkable, x-ray abdomen unremarkable. Right upper quadrant ultrasound shows Dilated CBD which is unchanged from prior ultrasound. LFT stable no indication for acute intervention. Continue supportive measures, placing on scheduled Reglan. Stool softener.  2. Non-STEMI. Abnormal stress test. Troponins are trending downwards. Could be contributing to patient's nausea and vomiting. Cardiology consulted, S/P cardiac catheterization- severe triple-vessel disease, patient will need bypass surgery, thoracic onboard. although serum creatinine worsening prior to cath, now stable.  Cardiology would like to continue IV hydration. And plan cath. Continue on amlodipine, carvedilol, no indication for diuretics at present. Echo 60-65% EF. Discussed with nephrology, patient's chances of developing severe renal dysfunction and going on hemodialysis is 1 out of 10.  3. Acute on chronic kidney disease stage IV. Renal function progressively worsening. Now stable. Due to poor by mouth intake as well as continues use of lisinopril.  Avoid any further nephrotoxic medication, continue IV hydration. Patient is going to receive contrast for cardiac catheterization  which may worsen renal function. Urine output remains adequate, patient actually negative by ins and outs calculation although unsure whether they are accurate. Blood pressure was actually elevated. Nephrology consulted for further input  Recommendation I would check SPEP UPEP and protein creatinine ratio. CT scan does not show any obstruction but does show perinephric edema which is unchanged from prior years. UA x2 bland.  4. Type II DM.  Uncontrolled. Hyperglycemia. On Amaryl at home, hemoglobin A1c 10.1. Continue sliding scale insulin, Oral hypoglycemic agents as well as long-acting insulin is currently on hold due to poor by mouth intake. diabetic gastroparesis causing nausea and vomiting cannot be ruled out.  5.Fever with chills. Extensive workup performed, urine culture unremarkable  urine analysis unremarkable.  CT abdomen pelvis unremarkable other than perinephric edema but no evidence of UTI. X-ray chest unremarkable. Ultrasound abdomen unremarkable. ESR, CRP, Pro calcitonin level unremarkable. Blood culture negative for 2 days, no indication for further workup or treatment.  6. Bilateral basal crackles. Likely mild fluid volume overload. Patient is going to get incentive spirometry. Recommend to ambulate as well as stay upright.  Getting better but now symptomatic. We'll continue to closely monitor.  7. Essential hypertension.  Accelerated hypertension. Patient treated with elevated blood pressure, likely his AKI is also associated with hypertensive nephropathy. Currently his blood pressure has been normal to low normal and with his acute kidney injury I would reduce his amlodipine from 10 mg to 5 mg per monitor.  Diet: Cardiac and carb modified diet DVT Prophylaxis: subcutaneous Heparin  Advance goals of care discussion: Full code  Family Communication: no family was present at bedside, at the time of interview.   Disposition:  Discharge to home with home  health.  Consultants: cardiology nephrology  Procedures: Echocardiogram, stress test  Antibiotics: Anti-infectives    None  Objective: Physical Exam: Vitals:   10/22/16 2345 10/23/16 0515 10/23/16 0535 10/23/16 1156  BP: 126/66 128/72 (!) 141/80 126/63  Pulse: 66  74 68  Resp: 18  (!) 23 18  Temp: 99 F (37.2 C)  98.7 F (37.1 C) 97.8 F (36.6 C)  TempSrc: Oral  Oral Oral  SpO2: 93%  91%   Weight:   71.2 kg (156 lb 14.4 oz)   Height:        Intake/Output Summary (Last 24 hours) at 10/23/16 1645 Last data filed at 10/23/16 1245  Gross per 24 hour  Intake            491.1 ml  Output              725 ml  Net           -233.9 ml   Filed Weights   10/21/16 0547 10/22/16 0412 10/23/16 0535  Weight: 66.9 kg (147 lb 8 oz) 67.8 kg (149 lb 6.4 oz) 71.2 kg (156 lb 14.4 oz)   General: Alert, Awake and Oriented to Time, Place and Person. Appear in mild distress, affect appropriate Eyes: PERRL, Conjunctiva normal ENT: Oral Mucosa clear moist. Neck: no JVD, no Abnormal Mass Or lumps Cardiovascular: S1 and S2 Present, no Murmur, Peripheral Pulses Present Respiratory: Bilateral Air entry equal and Decreased, no use of accessory muscle, basal Crackles, no wheezes Abdomen: Bowel Sound present, Soft and no tenderness Skin: redness no, no Rash, no induration Extremities: no Pedal edema, no calf tenderness Neurologic: Grossly no focal neuro deficit. Bilaterally Equal motor strength Data Reviewed: CBC:  Recent Labs Lab 10/19/16 0254 10/20/16 0340 10/21/16 0323 10/22/16 0243 10/23/16 0548  WBC 11.9* 12.1* 9.8 7.7 7.1  HGB 11.4* 11.4* 11.5* 10.2* 10.5*  HCT 34.5* 34.7* 34.2* 30.6* 31.7*  MCV 88.2 87.8 86.8 86.9 86.1  PLT 271 278 253 249 366   Basic Metabolic Panel:  Recent Labs Lab 10/19/16 1412 10/20/16 0340 10/21/16 0323 10/22/16 0243 10/23/16 0549  NA 139  138 135 132* 132* 131*  K 3.8  3.7 3.8 3.6 3.7 3.7  CL 108  107 105 104 105 105  CO2 21*  21* 19*  20* 19* 17*  GLUCOSE 144*  144* 172* 172* 193* 197*  BUN 48*  48* 48* 52* 54* 53*  CREATININE 2.00*  2.02* 1.93* 2.05* 2.07* 2.14*  CALCIUM 8.8*  8.9 8.3* 8.0* 7.9* 8.0*  MG  --  2.2  --   --   --   PHOS  --  3.6 2.4* 2.5 2.9    Liver Function Tests:  Recent Labs Lab 10/17/16 0450 10/19/16 0254 10/20/16 0340 10/21/16 0323 10/22/16 0243 10/23/16 0549  AST 38 44*  --   --   --   --   ALT 24 31  --   --   --   --   ALKPHOS 57 55  --   --   --   --   BILITOT 0.7 0.8  --   --   --   --   PROT 6.1* 6.3*  --   --   --   --   ALBUMIN 3.1* 3.0* 3.0* 2.5* 2.3* 2.3*    Recent Labs Lab 10/19/16 0254  LIPASE 16   No results for input(s): AMMONIA in the last 168 hours. Coagulation Profile:  Recent Labs Lab 10/19/16 0254  INR 1.22   Cardiac Enzymes: No results for input(s): CKTOTAL, CKMB, CKMBINDEX, TROPONINI in the last 168 hours.  BNP (last 3 results) No results for input(s): PROBNP in the last 8760 hours. CBG:  Recent Labs Lab 10/22/16 0752 10/22/16 1200 10/22/16 2036 10/23/16 0748 10/23/16 1154  GLUCAP 184* 187* 227* 213* 204*   Studies: No results found.  Scheduled Meds: . [START ON 10/24/2016] amLODipine  5 mg Oral Daily  . aspirin  81 mg Oral Daily  . carvedilol  6.25 mg Oral BID WC  . hydrALAZINE  25 mg Oral Q8H  . insulin aspart  0-5 Units Subcutaneous QHS  . insulin aspart  0-9 Units Subcutaneous TID WC  . pantoprazole  40 mg Oral BID  . polyethylene glycol  17 g Oral Daily  . senna-docusate  1 tablet Oral BID  . sodium chloride flush  3 mL Intravenous Q12H   Continuous Infusions: . sodium chloride    . heparin 750 Units/hr (10/23/16 1432)   PRN Meds: sodium chloride, acetaminophen, albuterol, morphine injection, ondansetron (ZOFRAN) IV, sodium chloride flush, zolpidem  Time spent: 30 minutes  Author: Berle Mull, MD [facility] Pager: [phone] 10/23/2016 4:45 PM  If 7PM-7AM, please contact night-coverage at [email or web address],  password [facility]

## 2016-10-23 NOTE — Progress Notes (Signed)
Pre-op Cardiac Surgery  Carotid Findings:  Bilateral: No significant (1-39%) ICA stenosis. Antegrade vertebral flow.    Upper Extremity Right Left  Brachial Pressures 141 143  Radial Waveforms Tri Tri  Ulnar Waveforms Tri Tri  Palmar Arch (Allen's Test) Normal  Normal    Findings:  Pedal artery waveforms within normal limits.  Landry Mellow, RDMS, RVT 10/23/2016

## 2016-10-23 NOTE — Progress Notes (Signed)
[facility] ROUNDING NOTE   Subjective:   HPI:66 y.o.malewith PMH significant for HTN, NIDDM, dyslipidemia, and smoking who presented to the ED with two days of malaise, fatigue, and persistent nausea with bouts of emesis. His ECG does not meet STEMI criteria but could represent subendocardial ischemia.  He was to undergo cardiac catheterization after optimizing his renal function. He has had diabetes for 20 years and has had no evidence of retinopathy or peripheral neuropathy. He has not taking any NSAIDS as an outpatient and denies any chronic pain. He has no complaints of shortness of breath and has no leg swelling. He denies any current chest pain. He has had no family history of renal disease It appears that in April 2016, he had a creatinine of 1.3. At that time he was admitted to the hospital with acute renal failure and a creatinine of 2 in association with dehydration and ACE inhibitor use. He has was placed back on the lisinopril as an outpatient and 2 years later was admitted with similar presentation.  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 5/25  -- revascularization being contemplated     Objective:  Vital signs in last 24 hours:  Temp:  [98.1 F (36.7 C)-99 F (37.2 C)] 98.7 F (37.1 C) (05/27 0535) Pulse Rate:  [66-74] 74 (05/27 0535) Resp:  [17-23] 23 (05/27 0535) BP: (115-141)/(64-80) 141/80 (05/27 0535) SpO2:  [91 %-94 %] 91 % (05/27 0535) Weight:  [156 lb 14.4 oz (71.2 kg)] 156 lb 14.4 oz (71.2 kg) (05/27 0535)  Weight change: 7 lb 8 oz (3.402 kg) Filed Weights   10/21/16 0547 10/22/16 0412 10/23/16 0535  Weight: 147 lb 8 oz (66.9 kg) 149 lb 6.4 oz (67.8 kg) 156 lb 14.4 oz (71.2 kg)    Intake/Output: I/O last 3 completed shifts: In: 1180.7 [P.O.:240; I.V.:940.7] Out: 1325  [Urine:1325]   Intake/Output this shift:  No intake/output data recorded.  CVS- RRR RS- CTA ABD- BS present soft non-distended EXT- no edema   Basic Metabolic Panel:  Recent Labs Lab 10/19/16 1412 10/20/16 0340 10/21/16 0323 10/22/16 0243 10/23/16 0549  NA 139  138 135 132* 132* 131*  K 3.8  3.7 3.8 3.6 3.7 3.7  CL 108  107 105 104 105 105  CO2 21*  21* 19* 20* 19* 17*  GLUCOSE 144*  144* 172* 172* 193* 197*  BUN 48*  48* 48* 52* 54* 53*  CREATININE 2.00*  2.02* 1.93* 2.05* 2.07* 2.14*  CALCIUM 8.8*  8.9 8.3* 8.0* 7.9* 8.0*  MG  --  2.2  --   --   --   PHOS  --  3.6 2.4* 2.5 2.9    Liver Function Tests:  Recent Labs Lab 10/17/16 0450 10/19/16 0254 10/20/16 0340 10/21/16 0323 10/22/16 0243 10/23/16 0549  AST 38 44*  --   --   --   --   ALT 24 31  --   --   --   --   ALKPHOS 57 55  --   --   --   --   BILITOT 0.7 0.8  --   --   --   --   PROT 6.1* 6.3*  --   --   --   --   ALBUMIN 3.1* 3.0* 3.0* 2.5* 2.3* 2.3*  Recent Labs Lab 10/19/16 0254  LIPASE 16   No results for input(s): AMMONIA in the last 168 hours.  CBC:  Recent Labs Lab 10/19/16 0254 10/20/16 0340 10/21/16 0323 10/22/16 0243 10/23/16 0548  WBC 11.9* 12.1* 9.8 7.7 7.1  HGB 11.4* 11.4* 11.5* 10.2* 10.5*  HCT 34.5* 34.7* 34.2* 30.6* 31.7*  MCV 88.2 87.8 86.8 86.9 86.1  PLT 271 278 253 249 267    Cardiac Enzymes:  Recent Labs Lab 10/16/16 1014 10/16/16 1450  TROPONINI 0.55* 0.59*    BNP: Invalid input(s): POCBNP  CBG:  Recent Labs Lab 10/21/16 2113 10/22/16 0752 10/22/16 1200 10/22/16 2036 10/23/16 0748  GLUCAP 195* 184* 187* 227* 213*    Microbiology: Results for orders placed or performed during the hospital encounter of 10/15/16  MRSA PCR Screening     Status: None   Collection Time: 10/16/16  5:11 AM  Result Value Ref Range Status   MRSA by PCR NEGATIVE NEGATIVE Final    Comment:        The GeneXpert MRSA Assay (FDA approved for NASAL  specimens only), is one component of a comprehensive MRSA colonization surveillance program. It is not intended to diagnose MRSA infection nor to guide or monitor treatment for MRSA infections.   Culture, Urine     Status: None   Collection Time: 10/19/16  2:05 PM  Result Value Ref Range Status   Specimen Description URINE, CLEAN CATCH  Final   Special Requests NONE  Final   Culture NO GROWTH  Final   Report Status 10/20/2016 FINAL  Final  Culture, blood (routine x 2)     Status: None (Preliminary result)   Collection Time: 10/19/16  2:22 PM  Result Value Ref Range Status   Specimen Description BLOOD LEFT HAND  Final   Special Requests IN PEDIATRIC BOTTLE Blood Culture adequate volume  Final   Culture NO GROWTH 3 DAYS  Final   Report Status PENDING  Incomplete  Culture, blood (routine x 2)     Status: None (Preliminary result)   Collection Time: 10/19/16  2:27 PM  Result Value Ref Range Status   Specimen Description BLOOD RIGHT ARM  Final   Special Requests IN PEDIATRIC BOTTLE Blood Culture adequate volume  Final   Culture NO GROWTH 3 DAYS  Final   Report Status PENDING  Incomplete    Coagulation Studies: No results for input(s): LABPROT, INR in the last 72 hours.  Urinalysis: No results for input(s): COLORURINE, LABSPEC, PHURINE, GLUCOSEU, HGBUR, BILIRUBINUR, KETONESUR, PROTEINUR, UROBILINOGEN, NITRITE, LEUKOCYTESUR in the last 72 hours.  Invalid input(s): APPERANCEUR    Imaging: No results found.   Medications:   . sodium chloride    . heparin 800 Units/hr (10/23/16 0736)   . amLODipine  10 mg Oral Daily  . aspirin  81 mg Oral Daily  . carvedilol  6.25 mg Oral BID WC  . hydrALAZINE  25 mg Oral Q8H  . insulin aspart  0-5 Units Subcutaneous QHS  . insulin aspart  0-9 Units Subcutaneous TID WC  . pantoprazole  40 mg Oral BID  . polyethylene glycol  17 g Oral Daily  . senna-docusate  1 tablet Oral BID  . sodium chloride flush  3 mL Intravenous Q12H   sodium  chloride, acetaminophen, albuterol, morphine injection, ondansetron (ZOFRAN) IV, sodium chloride flush, zolpidem  Assessment/ Plan:   CKD stage 3 it appears that the renal function is about 1.3 to 2 mg/dl At baseline with a mildly decreased GFR. He  most likely has some nephrosclerosis as the urinalysis is benign. I do not think serological work up is needed. He may benefit from SPEP and UPEP although there is no evidence of myeloma on clinical presentation. A pr /cr ratio is reasonable.  AKI This is most likely hemodynamic as in 2016, he is at risk of contrast associated nephropathy but not likely to need dialysis. I explained the risks and benefits to the patient who articulated them back to me and appears to have reasonably good understanding. Fortunately creatinine stable this am   Anemia stable no need for ESA  Bones Would recommend PTH  Patient with critical disease and revascularization being contemplated      LOS: 7 Shawn Collins W @TODAY @9 :39 AM

## 2016-10-23 NOTE — Progress Notes (Signed)
Progress Note  Patient Name: Shawn Collins Date of Encounter: 10/23/2016  Primary Cardiologist: Dr. Radford Pax  Subjective   No concerns Denies CP or SOB  Inpatient Medications    Scheduled Meds: . amLODipine  10 mg Oral Daily  . aspirin  81 mg Oral Daily  . carvedilol  6.25 mg Oral BID WC  . hydrALAZINE  25 mg Oral Q8H  . insulin aspart  0-5 Units Subcutaneous QHS  . insulin aspart  0-9 Units Subcutaneous TID WC  . pantoprazole  40 mg Oral BID  . polyethylene glycol  17 g Oral Daily  . senna-docusate  1 tablet Oral BID  . sodium chloride flush  3 mL Intravenous Q12H   Continuous Infusions: . sodium chloride    . heparin 800 Units/hr (10/23/16 0736)   PRN Meds: sodium chloride, acetaminophen, albuterol, morphine injection, ondansetron (ZOFRAN) IV, sodium chloride flush, zolpidem   Vital Signs    Vitals:   10/22/16 2032 10/22/16 2345 10/23/16 0515 10/23/16 0535  BP: 115/64 126/66 128/72 (!) 141/80  Pulse: 67 66  74  Resp: 17 18  (!) 23  Temp: 98.5 F (36.9 C) 99 F (37.2 C)  98.7 F (37.1 C)  TempSrc: Oral Oral  Oral  SpO2: 94% 93%  91%  Weight:    156 lb 14.4 oz (71.2 kg)  Height:        Intake/Output Summary (Last 24 hours) at 10/23/16 1007 Last data filed at 10/23/16 0545  Gross per 24 hour  Intake            431.1 ml  Output              925 ml  Net           -493.9 ml   Filed Weights   10/21/16 0547 10/22/16 0412 10/23/16 0535  Weight: 147 lb 8 oz (66.9 kg) 149 lb 6.4 oz (67.8 kg) 156 lb 14.4 oz (71.2 kg)    Telemetry    sinus  Physical Exam   Physical Exam: Vitals:   10/22/16 2032 10/22/16 2345 10/23/16 0515 10/23/16 0535  BP: 115/64 126/66 128/72 (!) 141/80  Pulse: 67 66  74  Resp: 17 18  (!) 23  Temp: 98.5 F (36.9 C) 99 F (37.2 C)  98.7 F (37.1 C)  TempSrc: Oral Oral  Oral  SpO2: 94% 93%  91%  Weight:    156 lb 14.4 oz (71.2 kg)  Height:        GEN- The patient is well appearing, alert and oriented x 3 today.   Head-  normocephalic, atraumatic Eyes-  Sclera clear, conjunctiva pink Ears- hearing intact Oropharynx- clear Neck- supple, Lungs- Clear to ausculation bilaterally, normal work of breathing Heart- Regular rate and rhythm  GI- soft, NT, ND, + BS Extremities- no clubbing, cyanosis, or edema, groin is without hematoma/ bruit MS- no significant deformity or atrophy Skin- no rash or lesion Psych- euthymic mood, full affect Neuro- strength and sensation are intact   Labs    Chemistry Recent Labs Lab 10/17/16 0450  10/19/16 0254  10/21/16 0323 10/22/16 0243 10/23/16 0549  NA 135  < > 135  < > 132* 132* 131*  K 4.2  < > 4.0  < > 3.6 3.7 3.7  CL 104  < > 103  < > 104 105 105  CO2 22  < > 22  < > 20* 19* 17*  GLUCOSE 120*  < > 180*  < > 172* 193*  197*  BUN 25*  < > 41*  < > 52* 54* 53*  CREATININE 1.56*  < > 1.91*  < > 2.05* 2.07* 2.14*  CALCIUM 8.5*  < > 8.7*  < > 8.0* 7.9* 8.0*  PROT 6.1*  --  6.3*  --   --   --   --   ALBUMIN 3.1*  --  3.0*  < > 2.5* 2.3* 2.3*  AST 38  --  44*  --   --   --   --   ALT 24  --  31  --   --   --   --   ALKPHOS 57  --  55  --   --   --   --   BILITOT 0.7  --  0.8  --   --   --   --   GFRNONAA 44*  < > 35*  < > 32* 31* 30*  GFRAA 51*  < > 40*  < > 37* 36* 35*  ANIONGAP 9  < > 10  < > 8 8 9   < > = values in this interval not displayed.   Hematology  Recent Labs Lab 10/21/16 0323 10/22/16 0243 10/23/16 0548  WBC 9.8 7.7 7.1  RBC 3.94* 3.52* 3.68*  HGB 11.5* 10.2* 10.5*  HCT 34.2* 30.6* 31.7*  MCV 86.8 86.9 86.1  MCH 29.2 29.0 28.5  MCHC 33.6 33.3 33.1  RDW 12.3 12.5 12.3  PLT 253 249 267    Cardiac Enzymes  Recent Labs Lab 10/16/16 1014 10/16/16 1450  TROPONINI 0.55* 0.59*    No results for input(s): TROPIPOC in the last 168 hours.   BNPNo results for input(s): BNP, PROBNP in the last 168 hours.   DDimer No results for input(s): DDIMER in the last 168 hours.   Patient Profile     66 y.o. male with PMH significant for HTN,  NIDDM, dyslipidemia, and smoking who presented to the ED with two days of malaise, fatigue, and persistent nausea with bouts of emesis. His ECG does not meet STEMI criteria but could represent subendocardial ischemia.   Assessment & Plan    1. CAD/ NSTEMI Dr Everrett Coombe notes reviewed Patient anticipating surgery Continue on IV heparin for now   2. HTN Stable No change required today  3. AKI Followed by nephrology Stable No change required today  4. Ischemic CM Continue medical management/ medicine titration post op    Signed, Thompson Grayer, MD  10/23/2016, 10:07 AM

## 2016-10-23 NOTE — Progress Notes (Addendum)
ANTICOAGULATION CONSULT NOTE - Follow-Up Consult  Pharmacy Consult for heparin Indication: chest pain/ACS  No Known Allergies  Patient Measurements: Height: 5\' 10"  (177.8 cm) Weight: 156 lb 14.4 oz (71.2 kg) IBW/kg (Calculated) : 73 Heparin Dosing Weight: 67kg  Vital Signs: Temp: 98.7 F (37.1 C) (05/27 0535) Temp Source: Oral (05/27 0535) BP: 141/80 (05/27 0535) Pulse Rate: 74 (05/27 0535)  Labs:  Recent Labs  10/21/16 0323  10/22/16 0243 10/22/16 0907 10/22/16 1526 10/23/16 0548 10/23/16 0549  HGB 11.5*  --  10.2*  --   --  10.5*  --   HCT 34.2*  --  30.6*  --   --  31.7*  --   PLT 253  --  249  --   --  267  --   HEPARINUNFRC  --   < > 0.14* 0.45 0.64  --  0.79*  CREATININE 2.05*  --  2.07*  --   --   --  2.14*  < > = values in this interval not displayed.  Estimated Creatinine Clearance: 33.7 mL/min (A) (by C-G formula based on SCr of 2.14 mg/dL (H)).   Medical History: Past Medical History:  Diagnosis Date  . Diabetes mellitus without complication ([place])   . Emphysema lung ([place])   . Hyperlipidemia   . Hypertension      Assessment: 58 yoM with multiple cardiac risk factors s/p cath found to have severe 3 vessel CAD. Cardiac surgery has been consulted and is considering CABG vs intervention on LAD. Heparin resumed 5/26, currently supratherapeutic at 0.79. CBC stable,   Goal of Therapy:  Heparin level 0.3-0.7 units/ml Monitor platelets by anticoagulation protocol: Yes   Plan:  -Reduce heparin to 800 units/hr -Check 6-hr heparin level -Monitor daily heparin level, CBC, S/Sx bleeding -F/U plans for CABG  ADDENDUM: Repeat heparin level therapeutic at 0.70 but on upper limit of range.  Plan: -Reduce slightly to 750 units/hr -Check confirmatory heparin level  Arrie Senate, PharmD PGY-1 Pharmacy Resident Pager: [phone] 10/23/2016

## 2016-10-23 NOTE — Progress Notes (Signed)
ANTICOAGULATION CONSULT NOTE  Pharmacy Consult for heparin Indication: chest pain/ACS  No Known Allergies  Patient Measurements: Height: 5\' 10"  (177.8 cm) Weight: 156 lb 14.4 oz (71.2 kg) IBW/kg (Calculated) : 73 Heparin Dosing Weight: 67kg  Vital Signs: Temp: 97.8 F (36.6 C) (05/27 1649) Temp Source: Oral (05/27 1649) BP: 123/72 (05/27 1649) Pulse Rate: 67 (05/27 1649)  Labs:  Recent Labs  10/21/16 0323 10/22/16 0243  10/23/16 0548 10/23/16 0549 10/23/16 1327 10/23/16 2038  HGB 11.5* 10.2*  --  10.5*  --   --   --   HCT 34.2* 30.6*  --  31.7*  --   --   --   PLT 253 249  --  267  --   --   --   HEPARINUNFRC  --  0.14*  < >  --  0.79* 0.70 0.53  CREATININE 2.05* 2.07*  --   --  2.14*  --   --   < > = values in this interval not displayed.  Estimated Creatinine Clearance: 33.7 mL/min (A) (by C-G formula based on SCr of 2.14 mg/dL (H)).   Medical History: Past Medical History:  Diagnosis Date  . Diabetes mellitus without complication ([place])   . Emphysema lung ([place])   . Hyperlipidemia   . Hypertension      Assessment: 1 yoM with multiple cardiac risk factors s/p cath found to have severe 3 vessel CAD. Cardiac surgery has been consulted and is considering CABG vs intervention on LAD.   Evening heparin level now therapeutic.   Goal of Therapy:  Heparin level 0.3-0.7 units/ml Monitor platelets by anticoagulation protocol: Yes   Plan:  -Continue heparin at 750 units/hr -Daily HL, CBC -F/u cardiology plans    [place] Better, PharmD, BCPS Clinical Pharmacist 10/23/2016 9:41 PM

## 2016-10-23 NOTE — Plan of Care (Signed)
Problem: Bowel/Gastric: Goal: Will not experience complications related to bowel motility Outcome: Not Progressing Obtained an additional order for stool softener. Bowel sounds audible in all quadrants.

## 2016-10-23 NOTE — Progress Notes (Signed)
Lower Extremity Vein Map Preliminary results:    Right Great Saphenous Vein   Segment Diameter Comment  1. Origin 5.91mm   2. High Thigh 2.2mm   3. Mid Thigh 1.42mm branch  4. Low Thigh 1.53mm   5. At Knee 1.18mm   6. High Calf 1.64mm branch  7. Low Calf 1.29mm   8. Ankle 1.49mm    mm    mm    mm     Left Great Saphenous Vein   Segment Diameter Comment  1. Origin 2.51mm   2. High Thigh 1.66mm   3. Mid Thigh 1.46mm branch  4. Low Thigh 1.64mm   5. At Knee 1.102mm   6. High Calf 1.45mm   7. Low Calf 1.63mm branch  8. Ankle 1.48mm    mm    mm    mm    Landry Mellow, RDMS, RVT 10/23/2016

## 2016-10-24 DIAGNOSIS — I25110 Atherosclerotic heart disease of native coronary artery with unstable angina pectoris: Secondary | ICD-10-CM

## 2016-10-24 LAB — IRON AND TIBC
Iron: 38 ug/dL — ABNORMAL LOW (ref 45–182)
Saturation Ratios: 20 % (ref 17.9–39.5)
TIBC: 186 ug/dL — ABNORMAL LOW (ref 250–450)
UIBC: 148 ug/dL

## 2016-10-24 LAB — CULTURE, BLOOD (ROUTINE X 2)
CULTURE: NO GROWTH
CULTURE: NO GROWTH
Special Requests: ADEQUATE
Special Requests: ADEQUATE

## 2016-10-24 LAB — FERRITIN: Ferritin: 169 ng/mL (ref 24–336)

## 2016-10-24 LAB — CBC
HCT: 31.9 % — ABNORMAL LOW (ref 39.0–52.0)
Hemoglobin: 10.9 g/dL — ABNORMAL LOW (ref 13.0–17.0)
MCH: 28.9 pg (ref 26.0–34.0)
MCHC: 34.2 g/dL (ref 30.0–36.0)
MCV: 84.6 fL (ref 78.0–100.0)
Platelets: 321 10*3/uL (ref 150–400)
RBC: 3.77 MIL/uL — ABNORMAL LOW (ref 4.22–5.81)
RDW: 12.4 % (ref 11.5–15.5)
WBC: 6 10*3/uL (ref 4.0–10.5)

## 2016-10-24 LAB — RENAL FUNCTION PANEL
Albumin: 2.6 g/dL — ABNORMAL LOW (ref 3.5–5.0)
Anion gap: 9 (ref 5–15)
BUN: 53 mg/dL — AB (ref 6–20)
CALCIUM: 8.4 mg/dL — AB (ref 8.9–10.3)
CO2: 18 mmol/L — ABNORMAL LOW (ref 22–32)
CREATININE: 2.1 mg/dL — AB (ref 0.61–1.24)
Chloride: 103 mmol/L (ref 101–111)
GFR calc Af Amer: 36 mL/min — ABNORMAL LOW (ref >60)
GFR, EST NON AFRICAN AMERICAN: 31 mL/min — AB (ref >60)
GLUCOSE: 208 mg/dL — AB (ref 65–99)
PHOSPHORUS: 2.7 mg/dL (ref 2.5–4.6)
POTASSIUM: 3.9 mmol/L (ref 3.5–5.1)
Sodium: 130 mmol/L — ABNORMAL LOW (ref 135–145)

## 2016-10-24 LAB — VAS US DOPPLER PRE CABG
LEFT ECA DIAS: -9 cm/s
LEFT VERTEBRAL DIAS: 8 cm/s
Left CCA dist dias: 16 cm/s
Left CCA dist sys: 76 cm/s
Left CCA prox dias: 15 cm/s
Left CCA prox sys: 80 cm/s
Left ICA dist dias: -25 cm/s
Left ICA dist sys: -76 cm/s
Left ICA prox dias: -15 cm/s
Left ICA prox sys: -58 cm/s
RIGHT ECA DIAS: -12 cm/s
RIGHT VERTEBRAL DIAS: 26 cm/s
Right CCA prox dias: 13 cm/s
Right CCA prox sys: 124 cm/s
Right cca dist sys: -63 cm/s

## 2016-10-24 LAB — HEPARIN LEVEL (UNFRACTIONATED): Heparin Unfractionated: 0.48 IU/mL (ref 0.30–0.70)

## 2016-10-24 LAB — PROTEIN / CREATININE RATIO, URINE
CREATININE, URINE: 171.33 mg/dL
Protein Creatinine Ratio: 0.27 mg/mg{Cre} — ABNORMAL HIGH (ref 0.00–0.15)
TOTAL PROTEIN, URINE: 47 mg/dL

## 2016-10-24 LAB — GLUCOSE, CAPILLARY
GLUCOSE-CAPILLARY: 115 mg/dL — AB (ref 65–99)
Glucose-Capillary: 175 mg/dL — ABNORMAL HIGH (ref 65–99)
Glucose-Capillary: 268 mg/dL — ABNORMAL HIGH (ref 65–99)
Glucose-Capillary: 190 mg/dL — ABNORMAL HIGH (ref 65–99)

## 2016-10-24 NOTE — Progress Notes (Signed)
ANTICOAGULATION CONSULT NOTE - Follow-Up Consult  Pharmacy Consult for heparin Indication: chest pain/ACS  No Known Allergies  Patient Measurements: Height: 5\' 10"  (177.8 cm) Weight: 158 lb 3.2 oz (71.8 kg) IBW/kg (Calculated) : 73 Heparin Dosing Weight: 67kg  Vital Signs: Temp: 98 F (36.7 C) (05/28 0453) Temp Source: Oral (05/28 0453) BP: 146/72 (05/28 0453) Pulse Rate: 69 (05/28 0453)  Labs:  Recent Labs  10/22/16 0243  10/23/16 0548 10/23/16 0549 10/23/16 1327 10/23/16 2038 10/24/16 0430  HGB 10.2*  --  10.5*  --   --   --  10.9*  HCT 30.6*  --  31.7*  --   --   --  31.9*  PLT 249  --  267  --   --   --  321  HEPARINUNFRC 0.14*  < >  --  0.79* 0.70 0.53 0.48  CREATININE 2.07*  --   --  2.14*  --   --  2.10*  < > = values in this interval not displayed.  Estimated Creatinine Clearance: 34.7 mL/min (A) (by C-G formula based on SCr of 2.1 mg/dL (H)).   Medical History: Past Medical History:  Diagnosis Date  . Diabetes mellitus without complication ([place])   . Emphysema lung ([place])   . Hyperlipidemia   . Hypertension      Assessment: 37 yoM with multiple cardiac risk factors s/p cath found to have severe 3 vessel CAD. Cardiac surgery has been consulted and is considering CABG vs intervention on LAD. Heparin resumed 5/26, currently therapeutic at 0.48, CBC stable.  Goal of Therapy:  Heparin level 0.3-0.7 units/ml Monitor platelets by anticoagulation protocol: Yes   Plan:  -Continue heparin 750 units/hr -Monitor daily heparin level, CBC, S/Sx bleeding -F/U plans for CABG  Arrie Senate, PharmD PGY-1 Pharmacy Resident Pager: [phone] 10/24/2016

## 2016-10-24 NOTE — Progress Notes (Signed)
[facility] Progress Note  Patient: Shawn Collins KZS:[id number]   PCP: Nolene Ebbs, MD DOB: 13-Aug-1950   DOA: 10/15/2016   DOS: 10/24/2016   Date of Service: the patient was seen and examined on 10/24/2016  Subjective: having some shortnes of breath, no chest pain fever or chills, no cough. No edema. Tolerating oral diet  Brief hospital course: Pt. with PMH of type II DM, HTN, dyslipidemia; admitted on 10/15/2016, presented with complaint of nausea vomiting, was found to have viral gastroenteritis and NSTEMI. Currently further plan is follow renal function after cardiac cath.  Assessment and Plan: 1. Nausea vomiting and abdominal pain. Likely viral gastroenteritis. Possibility of gastroparesis CT abdomen pelvis unremarkable, x-ray abdomen unremarkable. Right upper quadrant ultrasound shows Dilated CBD which is unchanged from prior ultrasound. LFT stable no indication for acute intervention. Continue supportive measures, was on scheduled Reglan. No stopped and still feeling better. Continue Stool softener.  2. Non-STEMI. Abnormal stress test. Severe triple vessel CAD Cardiology consulted, S/P cardiac catheterization- severe triple-vessel disease, patient will need bypass surgery, cardiothoracic onboard. although serum creatinine worsening prior to cath, now stable.  Continue on amlodipine, carvedilol, no indication for diuretics at present. Echo 60-65% EF. PFT pending and vein mapping done.   3. Acute on chronic kidney disease stage IV. Renal function progressively worsening. Now stable. Due to poor by mouth intake as well as continues use of lisinopril. Avoid any further nephrotoxic medication, continue IV hydration. Urine output remains adequate, patient actually negative by ins and outs calculation although unsure whether they are accurate. Blood pressure was actually elevated. Nephrology consulted for further input  Per Recommendation I would check SPEP. Pending.    CT abdomen does not show any obstruction but does show perinephric edema which is unchanged from prior years. UA x2 bland.  4. Type II DM. Uncontrolled. Hyperglycemia. On Amaryl at home, hemoglobin A1c 10.1. Continue sliding scale insulin, Oral hypoglycemic agents as well as long-acting insulin is currently on hold due to poor by mouth intake. diabetic gastroparesis causing nausea and vomiting cannot be ruled out.  5.Fever with chills. Extensive workup performed, urine culture unremarkable  urine analysis unremarkable.  CT abdomen pelvis unremarkable other than perinephric edema but no evidence of UTI. X-ray chest unremarkable. Ultrasound abdomen unremarkable. ESR, CRP, Pro calcitonin level unremarkable. Blood culture negative, no indication for further workup or treatment. Mild mild leukocytosis, continue current monitoring  6. Bilateral basal crackles. Atelectasis, currently resolved. Patient is going to get incentive spirometry. Recommend to ambulate as well as stay upright.  Getting better but now symptomatic. We'll continue to closely monitor.  7. Essential hypertension.  Accelerated hypertension. Patient treated with elevated blood pressure, likely his AKI is also associated with hypertensive nephropathy. Currently his blood pressure has been normal to low normal and with his acute kidney injury I would reduce his amlodipine from 10 mg to 5 mg per monitor.  Diet: cardiac and carb modified diet DVT Prophylaxis: on therapeutic anticoagulation.  Advance goals of care discussion: full code  Family Communication: no family was present at bedside, at the time of interview.  Disposition:  Discharge to be decided.  Consultants: cardiology, nephrology, cardiothoracic surgery Procedures: Echocardiogram, cardiac cath  Antibiotics: Anti-infectives    None       Objective: Physical Exam: Vitals:   10/24/16 1200 10/24/16 1231 10/24/16 1300 10/24/16 1400  BP:  137/66     Pulse:  84    Resp: (!) 21  15 (!) 23  Temp:  TempSrc:      SpO2:  (!) 88% 92%   Weight:      Height:        Intake/Output Summary (Last 24 hours) at 10/24/16 1559 Last data filed at 10/24/16 1400  Gross per 24 hour  Intake           760.75 ml  Output                0 ml  Net           760.75 ml   Filed Weights   10/22/16 0412 10/23/16 0535 10/24/16 0453  Weight: 67.8 kg (149 lb 6.4 oz) 71.2 kg (156 lb 14.4 oz) 71.8 kg (158 lb 3.2 oz)   General: Alert, Awake and Oriented to Time, Place and Person. Appear in mild distress, affect appropriate Eyes: PERRL, Conjunctiva normal ENT: Oral Mucosa clear moist. Neck: no JVD, no Abnormal Mass Or lumps Cardiovascular: S1 and S2 Present, no Murmur, Respiratory: Bilateral Air entry equal and Decreased, no use of accessory muscle, basal Crackles, no wheezes Abdomen: Bowel Sound present, Soft and no tenderness Skin: no redness, no Rash, no induration Extremities: no Pedal edema, no calf tenderness Neurologic: Grossly no focal neuro deficit. Bilaterally Equal motor strength  Data Reviewed: CBC:  Recent Labs Lab 10/20/16 0340 10/21/16 0323 10/22/16 0243 10/23/16 0548 10/24/16 0430  WBC 12.1* 9.8 7.7 7.1 6.0  HGB 11.4* 11.5* 10.2* 10.5* 10.9*  HCT 34.7* 34.2* 30.6* 31.7* 31.9*  MCV 87.8 86.8 86.9 86.1 84.6  PLT 278 253 249 267 865   Basic Metabolic Panel:  Recent Labs Lab 10/20/16 0340 10/21/16 0323 10/22/16 0243 10/23/16 0549 10/24/16 0430  NA 135 132* 132* 131* 130*  K 3.8 3.6 3.7 3.7 3.9  CL 105 104 105 105 103  CO2 19* 20* 19* 17* 18*  GLUCOSE 172* 172* 193* 197* 208*  BUN 48* 52* 54* 53* 53*  CREATININE 1.93* 2.05* 2.07* 2.14* 2.10*  CALCIUM 8.3* 8.0* 7.9* 8.0* 8.4*  MG 2.2  --   --   --   --   PHOS 3.6 2.4* 2.5 2.9 2.7    Liver Function Tests:  Recent Labs Lab 10/19/16 0254 10/20/16 0340 10/21/16 0323 10/22/16 0243 10/23/16 0549 10/24/16 0430  AST 44*  --   --   --   --   --   ALT 31  --   --    --   --   --   ALKPHOS 55  --   --   --   --   --   BILITOT 0.8  --   --   --   --   --   PROT 6.3*  --   --   --   --   --   ALBUMIN 3.0* 3.0* 2.5* 2.3* 2.3* 2.6*    Recent Labs Lab 10/19/16 0254  LIPASE 16   No results for input(s): AMMONIA in the last 168 hours. Coagulation Profile:  Recent Labs Lab 10/19/16 0254  INR 1.22   Cardiac Enzymes: No results for input(s): CKTOTAL, CKMB, CKMBINDEX, TROPONINI in the last 168 hours. BNP (last 3 results) No results for input(s): PROBNP in the last 8760 hours. CBG:  Recent Labs Lab 10/23/16 1154 10/23/16 1648 10/23/16 2104 10/24/16 0741 10/24/16 1119  GLUCAP 204* 182* 249* 175* 268*   Studies: No results found.  Scheduled Meds: . amLODipine  5 mg Oral Daily  . aspirin  81 mg Oral Daily  . carvedilol  6.25 mg Oral BID WC  . hydrALAZINE  25 mg Oral Q8H  . insulin aspart  0-5 Units Subcutaneous QHS  . insulin aspart  0-9 Units Subcutaneous TID WC  . pantoprazole  40 mg Oral BID  . polyethylene glycol  17 g Oral Daily  . senna-docusate  1 tablet Oral BID  . sodium chloride flush  3 mL Intravenous Q12H   Continuous Infusions: . sodium chloride 5 mL (10/23/16 2000)  . heparin 750 Units/hr (10/24/16 0911)   PRN Meds: sodium chloride, acetaminophen, albuterol, morphine injection, ondansetron (ZOFRAN) IV, sodium chloride flush, zolpidem  Time spent: 30 minutes  Author: Berle Mull, MD [facility] Pager: [phone] 10/24/2016 3:59 PM  If 7PM-7AM, please contact night-coverage at [email or web address], password [facility]

## 2016-10-24 NOTE — Progress Notes (Signed)
Patient ID: Shawn Collins, male   DOB: [birth year], 66 y.o.   MRN: [id number]  [facility] Progress Note   Assessment/ Plan:   1. AKI on chronic kidney disease stage III-IV: Underlying chronic kidney disease suspected to be from nephrosclerosis-possibly hypertensive/ischemic with acute kidney injury being hemodynamic versus contrast-induced. Renal function remains stable overnight with no acute electrolyte abnormalities and no indications for intervention at this time. The plan is to continue to closely follow his renal function post CABG to monitor for any additional hemodynamic flux and indications for intervention. 2. Coronary artery disease/NSTEMI: Coronary angiogram showed severe three-vessel disease and plans for CABG/revascularization noted later this week. He remains on intravenous heparin, aspirin and carvedilol. 3. Hypertension: Blood pressure appears to be relatively well controlled on the current regime of carvedilol, amlodipine and hydralazine with room for dose up titration if needed. 4. Anemia: Likely anemia of chronic disease including chronic kidney disease-check iron studies today. 5. Secondary hyperparathyroidism: PTH level within acceptable range for chronic kidney disease stage IV, calcium and phosphorus within goal.  Subjective:   Reports to be short of breath with mild exertion such as even feeding himself breakfast.    Objective:   BP 138/78   Pulse 74   Temp 97.9 F (36.6 C) (Oral)   Resp 10   Ht 5\' 10"  (1.778 m)   Wt 71.8 kg (158 lb 3.2 oz)   SpO2 94%   BMI 22.70 kg/m   Intake/Output Summary (Last 24 hours) at 10/24/16 0849 Last data filed at 10/24/16 0800  Gross per 24 hour  Intake           535.75 ml  Output                0 ml  Net           535.75 ml   Weight change: 0.59 kg (1 lb 4.8 oz)  Physical Exam: NGE:[id number] to be comfortable resting in bed, watching on television CVS: Pulse regular rhythm, normal rate, S1 and S2 normal Resp:  Anteriorly clear to auscultation Abd: Soft, flat, nontender Ext: Trace ankle edema  Imaging: No results found.  Labs: BMET  Recent Labs Lab 10/19/16 0254 10/19/16 1412 10/20/16 0340 10/21/16 0323 10/22/16 0243 10/23/16 0549 10/24/16 0430  NA 135 139  138 135 132* 132* 131* 130*  K 4.0 3.8  3.7 3.8 3.6 3.7 3.7 3.9  CL 103 108  107 105 104 105 105 103  CO2 22 21*  21* 19* 20* 19* 17* 18*  GLUCOSE 180* 144*  144* 172* 172* 193* 197* 208*  BUN 41* 48*  48* 48* 52* 54* 53* 53*  CREATININE 1.91* 2.00*  2.02* 1.93* 2.05* 2.07* 2.14* 2.10*  CALCIUM 8.7* 8.8*  8.9 8.3* 8.0* 7.9* 8.0* 8.4*  PHOS  --   --  3.6 2.4* 2.5 2.9 2.7   CBC  Recent Labs Lab 10/21/16 0323 10/22/16 0243 10/23/16 0548 10/24/16 0430  WBC 9.8 7.7 7.1 6.0  HGB 11.5* 10.2* 10.5* 10.9*  HCT 34.2* 30.6* 31.7* 31.9*  MCV 86.8 86.9 86.1 84.6  PLT 253 249 267 321   Medications:    . amLODipine  5 mg Oral Daily  . aspirin  81 mg Oral Daily  . carvedilol  6.25 mg Oral BID WC  . hydrALAZINE  25 mg Oral Q8H  . insulin aspart  0-5 Units Subcutaneous QHS  . insulin aspart  0-9 Units Subcutaneous TID WC  . pantoprazole  40 mg Oral  BID  . polyethylene glycol  17 g Oral Daily  . senna-docusate  1 tablet Oral BID  . sodium chloride flush  3 mL Intravenous Q12H   Elmarie Shiley, MD 10/24/2016, 8:49 AM

## 2016-10-24 NOTE — Progress Notes (Signed)
Patient ID: Shawn Collins, male   DOB: 1950/12/07, 66 y.o.   MRN: [id number]      [address]       [place] [address]             [phone]                 3 Days Post-Op Procedure(s) (LRB): Left Heart Cath and Coronary Angiography (N/A)  LOS: 8 days   Subjecti     no chest pain   Objective: Vital signs in last 24 hours: Patient Vitals for the past 24 hrs:  BP Temp Temp src Pulse Resp SpO2 Weight  10/24/16 1631 129/69 98 F (36.7 C) Oral 69 16 91 % -  10/24/16 1600 - - - - 11 - -  10/24/16 1500 - - - - 15 - -  10/24/16 1400 - - - - (!) 23 - -  10/24/16 1300 - - - - 15 92 % -  10/24/16 1231 137/66 - - 84 - (!) 88 % -  10/24/16 1200 - - - - (!) 21 - -  10/24/16 1120 131/72 98.1 F (36.7 C) Oral 69 20 92 % -  10/24/16 1100 - - - - (!) 23 - -  10/24/16 1000 - - - - 16 - -  10/24/16 0900 - - - - 13 - -  10/24/16 0800 138/78 97.9 F (36.6 C) Oral 74 10 94 % -  10/24/16 0700 - - - 70 12 95 % -  10/24/16 0453 (!) 146/72 98 F (36.7 C) Oral 69 13 96 % 158 lb 3.2 oz (71.8 kg)  10/23/16 2345 138/63 97.6 F (36.4 C) Oral 63 15 94 % -    Filed Weights   10/22/16 0412 10/23/16 0535 10/24/16 0453  Weight: 149 lb 6.4 oz (67.8 kg) 156 lb 14.4 oz (71.2 kg) 158 lb 3.2 oz (71.8 kg)    Hemodynamic parameters for last 24 hours:    Intake/Output from previous day: 05/27 0701 - 05/28 0700 In: 528.3 [P.O.:300; I.V.:228.3] Out: -  Intake/Output this shift: Total I/O In: 667.5 [P.O.:600; I.V.:67.5] Out: -   Scheduled Meds: . amLODipine  5 mg Oral Daily  . aspirin  81 mg Oral Daily  . carvedilol  6.25 mg Oral BID WC  . hydrALAZINE  25 mg Oral Q8H  . insulin aspart  0-5 Units Subcutaneous QHS  . insulin aspart  0-9 Units Subcutaneous TID WC  . pantoprazole  40 mg Oral BID  . polyethylene glycol  17 g Oral Daily  . senna-docusate  1 tablet Oral BID  . sodium chloride flush  3 mL Intravenous Q12H   Continuous Infusions: . sodium chloride 5 mL (10/23/16  2000)  . heparin 750 Units/hr (10/24/16 0911)   PRN Meds:.sodium chloride, acetaminophen, albuterol, morphine injection, ondansetron (ZOFRAN) IV, sodium chloride flush, zolpidem  General appearance: alert and cooperative Neurologic: intact Heart: regular rate and rhythm, S1, S2 normal, no murmur, click, rub or gallop Lungs: diminished breath sounds bibasilar Abdomen: soft, non-tender; bowel sounds normal; no masses,  no organomegaly Extremities: extremities normal, atraumatic, no cyanosis or edema and Homans sign is negative, no sign of DVT  Lab Results: CBC: Recent Labs  10/23/16 0548 10/24/16 0430  WBC 7.1 6.0  HGB 10.5* 10.9*  HCT 31.7* 31.9*  PLT 267 321   BMET:  Recent Labs  10/23/16 0549 10/24/16 0430  NA 131* 130*  K 3.7 3.9  CL 105 103  CO2  17* 18*  GLUCOSE 197* 208*  BUN 53* 53*  CREATININE 2.14* 2.10*  CALCIUM 8.0* 8.4*    PT/INR: No results for input(s): LABPROT, INR in the last 72 hours.   Radiology No results found. Chronic Kidney Disease   Stage I     GFR >90  Stage II    GFR 60-89  Stage IIIA GFR 45-59  Stage IIIB GFR 30-44  Stage IV   GFR 15-29  Stage V    GFR  <15  Lab Results  Component Value Date   CREATININE 2.10 (H) 10/24/2016   Estimated Creatinine Clearance: 34.7 mL/min (A) (by C-G formula based on SCr of 2.1 mg/dL (H)).   Assessment/Plan: S/P Procedure(s) (LRB): Left Heart Cath and Coronary Angiography (N/A) Anemia - unknown cause presumed related ckd  CABG WED or Thursday , will check cr in am, dopplers done   Grace Isaac MD 10/24/2016 5:48 PM

## 2016-10-24 NOTE — Progress Notes (Signed)
Progress Note  Patient Name: Shawn Collins Date of Encounter: 10/24/2016  Primary Cardiologist: Dr. Radford Pax  Subjective   Patient currently getting IV changed.  He denies any chest pain or pressure and no SOB  Inpatient Medications    Scheduled Meds: . amLODipine  5 mg Oral Daily  . aspirin  81 mg Oral Daily  . carvedilol  6.25 mg Oral BID WC  . hydrALAZINE  25 mg Oral Q8H  . insulin aspart  0-5 Units Subcutaneous QHS  . insulin aspart  0-9 Units Subcutaneous TID WC  . pantoprazole  40 mg Oral BID  . polyethylene glycol  17 g Oral Daily  . senna-docusate  1 tablet Oral BID  . sodium chloride flush  3 mL Intravenous Q12H   Continuous Infusions: . sodium chloride 5 mL (10/23/16 2000)  . heparin 750 Units/hr (10/23/16 1432)   PRN Meds: sodium chloride, acetaminophen, albuterol, morphine injection, ondansetron (ZOFRAN) IV, polyethylene glycol, sodium chloride flush, zolpidem   Vital Signs    Vitals:   10/23/16 0535 10/23/16 1156 10/23/16 1649 10/23/16 2345  BP: (!) 141/80 126/63 123/72 138/63  Pulse: 74 68 67 63  Resp: (!) 23 18 18 15   Temp: 98.7 F (37.1 C) 97.8 F (36.6 C) 97.8 F (36.6 C) 97.6 F (36.4 C)  TempSrc: Oral Oral Oral Oral  SpO2: 91%   94%  Weight: 156 lb 14.4 oz (71.2 kg)     Height:        Intake/Output Summary (Last 24 hours) at 10/24/16 0453 Last data filed at 10/24/16 0200  Gross per 24 hour  Intake           496.87 ml  Output              300 ml  Net           196.87 ml   Filed Weights   10/21/16 0547 10/22/16 0412 10/23/16 0535  Weight: 147 lb 8 oz (66.9 kg) 149 lb 6.4 oz (67.8 kg) 156 lb 14.4 oz (71.2 kg)    Telemetry    sinus  Physical Exam   Physical Exam: Vitals:   10/23/16 0535 10/23/16 1156 10/23/16 1649 10/23/16 2345  BP: (!) 141/80 126/63 123/72 138/63  Pulse: 74 68 67 63  Resp: (!) 23 18 18 15   Temp: 98.7 F (37.1 C) 97.8 F (36.6 C) 97.8 F (36.6 C) 97.6 F (36.4 C)  TempSrc: Oral Oral Oral Oral  SpO2: 91%    94%  Weight: 156 lb 14.4 oz (71.2 kg)     Height:        GEN- WD, thin male in NAD Head- atraumatic Neck- no JVD or bruit Lungs- CTA bilaterally Heart- RRR with no M/R/G GI- soft, NT, ND Extremities-no edema MS- no deformity Skin- no rash Psych- nornal affect Neuro- A&O x 3   Labs    Chemistry Recent Labs Lab 10/19/16 0254  10/21/16 0323 10/22/16 0243 10/23/16 0549  NA 135  < > 132* 132* 131*  K 4.0  < > 3.6 3.7 3.7  CL 103  < > 104 105 105  CO2 22  < > 20* 19* 17*  GLUCOSE 180*  < > 172* 193* 197*  BUN 41*  < > 52* 54* 53*  CREATININE 1.91*  < > 2.05* 2.07* 2.14*  CALCIUM 8.7*  < > 8.0* 7.9* 8.0*  PROT 6.3*  --   --   --   --   ALBUMIN 3.0*  < >  2.5* 2.3* 2.3*  AST 44*  --   --   --   --   ALT 31  --   --   --   --   ALKPHOS 55  --   --   --   --   BILITOT 0.8  --   --   --   --   GFRNONAA 35*  < > 32* 31* 30*  GFRAA 40*  < > 37* 36* 35*  ANIONGAP 10  < > 8 8 9   < > = values in this interval not displayed.   Hematology  Recent Labs Lab 10/21/16 0323 10/22/16 0243 10/23/16 0548  WBC 9.8 7.7 7.1  RBC 3.94* 3.52* 3.68*  HGB 11.5* 10.2* 10.5*  HCT 34.2* 30.6* 31.7*  MCV 86.8 86.9 86.1  MCH 29.2 29.0 28.5  MCHC 33.6 33.3 33.1  RDW 12.3 12.5 12.3  PLT 253 249 267    Cardiac Enzymes No results for input(s): TROPONINI in the last 168 hours.  No results for input(s): TROPIPOC in the last 168 hours.   BNPNo results for input(s): BNP, PROBNP in the last 168 hours.   DDimer No results for input(s): DDIMER in the last 168 hours.   Patient Profile     66 y.o. male with PMH significant for HTN, NIDDM, dyslipidemia, and smoking who presented to the ED with two days of malaise, fatigue, and persistent nausea with bouts of emesis. His ECG does not meet STEMI criteria but could represent subendocardial ischemia.   Assessment & Plan    1. CAD/ NSTEMI - cath with severe 3v vessel CAD - possible CABG this week - Continue on IV heparin/ASA/carvedilol  3.125mg  BID - start high dose statin - Lipitor 80mg  daily   2. HTN - BP adequately controlled - continue carvedilol 3.125mg  BID, Amlodipine 5mg  daily and hydralazine 25mg  TID.   3. Acute on CKD stage IIIb Followed by nephrology Stable No change required today    Signed, Fransico Him, MD  10/24/2016, 4:53 AM

## 2016-10-24 NOTE — Progress Notes (Signed)
Physical Therapy Treatment Patient Details Name: Shawn Collins MRN: [id number] DOB: [birth year] Today's Date: 10/24/2016    History of Present Illness 66 y.o. male admitted to [facility] on 10/15/16 for N/V and abdominal discomfort.  Troponins elevated NSTEMI , 3 vessel CAD and potential CABG PMHx of HTN, DM, COPD, and CKD stage III.    PT Comments    Pt pleasant and able to increase gait and activity tolerance today. Pt with incontinent stool on arrival and unaware. Pt with O2 sats 94% on RA at rest, dropped to 88% with gait and required 2L to maintain 91%. Pt with improving function but not yet ready to return home, still awaiting decision on CABG.  HR 69-84 with activity BP pre 132/81 Post 137/66   Follow Up Recommendations  Home health PT;Supervision for mobility/OOB     Equipment Recommendations  Rolling walker with 5" wheels    Recommendations for Other Services       Precautions / Restrictions Precautions Precautions: Fall Precaution Comments: watch sats    Mobility  Bed Mobility               General bed mobility comments: EOB on arrival  Transfers Overall transfer level: Needs assistance   Transfers: Sit to/from Stand Sit to Stand: Min guard         General transfer comment: pt able to stand with hands on thighs in prep for CABG with cues for anterior translation   Ambulation/Gait Ambulation/Gait assistance: Min guard Ambulation Distance (Feet): 350 Feet Assistive device: Rolling walker (2 wheeled) Gait Pattern/deviations: Step-through pattern;Decreased stride length;Trunk flexed   Gait velocity interpretation: Below normal speed for age/gender General Gait Details: cues for posture, position in RW and breathing technique. pt with sats dropping to 88% on RA with gait and required 2L to maintain 91% with gait   Stairs            Wheelchair Mobility    Modified Rankin (Stroke Patients Only)       Balance Overall balance assessment: Needs  assistance   Sitting balance-Leahy Scale: Good       Standing balance-Leahy Scale: Fair                              Cognition Arousal/Alertness: Awake/alert Behavior During Therapy: Flat affect Overall Cognitive Status: Impaired/Different from baseline Area of Impairment: Awareness                           Awareness: Emergent   General Comments: pt with incontinent stool on arrival but unaware, oriented      Exercises      General Comments        Pertinent Vitals/Pain Pain Assessment: No/denies pain    Home Living                      Prior Function            PT Goals (current goals can now be found in the care plan section) Progress towards PT goals: Progressing toward goals    Frequency           PT Plan Current plan remains appropriate    Co-evaluation              AM-PAC PT "6 Clicks" Daily Activity  Outcome Measure  Difficulty turning over in bed (including adjusting bedclothes, sheets and blankets)?: None Difficulty moving  from lying on back to sitting on the side of the bed? : None Difficulty sitting down on and standing up from a chair with arms (e.g., wheelchair, bedside commode, etc,.)?: A Little Help needed moving to and from a bed to chair (including a wheelchair)?: None Help needed walking in hospital room?: A Little Help needed climbing 3-5 steps with a railing? : A Little 6 Click Score: 21    End of Session Equipment Utilized During Treatment: Gait belt;Oxygen Activity Tolerance: Patient tolerated treatment well Patient left: in chair;with call bell/phone within reach Nurse Communication: Mobility status PT Visit Diagnosis: Difficulty in walking, not elsewhere classified (R26.2);Muscle weakness (generalized) (M62.81)     Time: 6168-3729 PT Time Calculation (min) (ACUTE ONLY): 27 min  Charges:  $Gait Training: 8-22 mins $Therapeutic Activity: 8-22 mins                    G Codes:        Elwyn Reach, PT [phone]   [place] 10/24/2016, 12:34 PM

## 2016-10-25 ENCOUNTER — Inpatient Hospital Stay (HOSPITAL_COMMUNITY): Payer: Medicare Other

## 2016-10-25 ENCOUNTER — Encounter (HOSPITAL_COMMUNITY): Payer: Self-pay | Admitting: Cardiology

## 2016-10-25 LAB — PROTEIN ELECTROPHORESIS, SERUM
A/G RATIO SPE: 0.8 (ref 0.7–1.7)
ALBUMIN ELP: 2.5 g/dL — AB (ref 2.9–4.4)
Alpha-1-Globulin: 0.3 g/dL (ref 0.0–0.4)
Alpha-2-Globulin: 1.2 g/dL — ABNORMAL HIGH (ref 0.4–1.0)
Beta Globulin: 0.8 g/dL (ref 0.7–1.3)
GLOBULIN, TOTAL: 3.2 g/dL (ref 2.2–3.9)
Gamma Globulin: 1 g/dL (ref 0.4–1.8)
TOTAL PROTEIN ELP: 5.7 g/dL — AB (ref 6.0–8.5)

## 2016-10-25 LAB — PULMONARY FUNCTION TEST
DL/VA % pred: 88 %
DL/VA: 4.09 ml/min/mmHg/L
DLCO cor % pred: 35 %
DLCO cor: 11.33 ml/min/mmHg
DLCO unc % pred: 30 %
DLCO unc: 9.94 ml/min/mmHg
FEF 25-75 Post: 1.44 L/sec
FEF 25-75 Pre: 0.96 L/sec
FEF2575-%Change-Post: 50 %
FEF2575-%Pred-Post: 56 %
FEF2575-%Pred-Pre: 37 %
FEV1-%Change-Post: 15 %
FEV1-%Pred-Post: 41 %
FEV1-%Pred-Pre: 35 %
FEV1-Post: 1.21 L
FEV1-Pre: 1.04 L
FEV1FVC-%Change-Post: 0 %
FEV1FVC-%Pred-Pre: 104 %
FEV6-%Change-Post: 16 %
FEV6-%Pred-Post: 40 %
FEV6-%Pred-Pre: 34 %
FEV6-Post: 1.49 L
FEV6-Pre: 1.29 L
FEV6FVC-%Pred-Post: 104 %
FEV6FVC-%Pred-Pre: 104 %
FVC-%Change-Post: 14 %
FVC-%Pred-Post: 38 %
FVC-%Pred-Pre: 33 %
FVC-Post: 1.49 L
FVC-Pre: 1.3 L
Post FEV1/FVC ratio: 81 %
Post FEV6/FVC ratio: 100 %
Pre FEV1/FVC ratio: 80 %
Pre FEV6/FVC Ratio: 100 %
RV % pred: 66 %
RV: 1.6 L
TLC % pred: 45 %
TLC: 3.18 L

## 2016-10-25 LAB — HEPARIN LEVEL (UNFRACTIONATED): HEPARIN UNFRACTIONATED: 0.34 [IU]/mL (ref 0.30–0.70)

## 2016-10-25 LAB — COMPREHENSIVE METABOLIC PANEL
ALT: 19 U/L (ref 17–63)
AST: 23 U/L (ref 15–41)
Albumin: 2.6 g/dL — ABNORMAL LOW (ref 3.5–5.0)
Alkaline Phosphatase: 42 U/L (ref 38–126)
Anion gap: 10 (ref 5–15)
BUN: 43 mg/dL — ABNORMAL HIGH (ref 6–20)
CO2: 19 mmol/L — ABNORMAL LOW (ref 22–32)
Calcium: 8.4 mg/dL — ABNORMAL LOW (ref 8.9–10.3)
Chloride: 102 mmol/L (ref 101–111)
Creatinine, Ser: 1.99 mg/dL — ABNORMAL HIGH (ref 0.61–1.24)
GFR calc Af Amer: 38 mL/min — ABNORMAL LOW (ref >60)
GFR calc non Af Amer: 33 mL/min — ABNORMAL LOW (ref >60)
Glucose, Bld: 181 mg/dL — ABNORMAL HIGH (ref 65–99)
Potassium: 4.3 mmol/L (ref 3.5–5.1)
Sodium: 131 mmol/L — ABNORMAL LOW (ref 135–145)
Total Bilirubin: 0.5 mg/dL (ref 0.3–1.2)
Total Protein: 5.8 g/dL — ABNORMAL LOW (ref 6.5–8.1)

## 2016-10-25 LAB — GLUCOSE, CAPILLARY
GLUCOSE-CAPILLARY: 246 mg/dL — AB (ref 65–99)
Glucose-Capillary: 180 mg/dL — ABNORMAL HIGH (ref 65–99)
Glucose-Capillary: 128 mg/dL — ABNORMAL HIGH (ref 65–99)
Glucose-Capillary: 221 mg/dL — ABNORMAL HIGH (ref 65–99)
Glucose-Capillary: 283 mg/dL — ABNORMAL HIGH (ref 65–99)

## 2016-10-25 LAB — ABO/RH: ABO/RH(D): O POS

## 2016-10-25 LAB — PREPARE RBC (CROSSMATCH)

## 2016-10-25 LAB — PROTIME-INR
INR: 1.14
Prothrombin Time: 14.7 seconds (ref 11.4–15.2)

## 2016-10-25 MED ORDER — DEXTROSE 5 % IV SOLN
1.5000 g | INTRAVENOUS | Status: DC
  Filled 2016-10-25 (×2): qty 1.5

## 2016-10-25 MED ORDER — SODIUM CHLORIDE 0.9 % IV SOLN
510.0000 mg | INTRAVENOUS | Status: DC
  Administered 2016-10-25: 510 mg via INTRAVENOUS
  Filled 2016-10-25 (×2): qty 17

## 2016-10-25 MED ORDER — CHLORHEXIDINE GLUCONATE CLOTH 2 % EX PADS: 6.0000 | MEDICATED_PAD | Freq: Once | CUTANEOUS | Status: AC

## 2016-10-25 MED ORDER — ROSUVASTATIN CALCIUM 10 MG PO TABS
10.0000 mg | ORAL_TABLET | Freq: Every day | ORAL | Status: DC
  Administered 2016-10-25: 10 mg via ORAL
  Filled 2016-10-25: qty 1

## 2016-10-25 MED ORDER — DEXTROSE 5 % IV SOLN
750.0000 mg | INTRAVENOUS | Status: DC
  Filled 2016-10-25 (×2): qty 750

## 2016-10-25 MED ORDER — PLASMA-LYTE 148 IV SOLN
INTRAVENOUS | Status: DC
  Filled 2016-10-25 (×2): qty 2.5

## 2016-10-25 MED ORDER — MAGNESIUM SULFATE 50 % IJ SOLN
40.0000 meq | INTRAMUSCULAR | Status: DC
  Filled 2016-10-25 (×2): qty 10

## 2016-10-25 MED ORDER — METOPROLOL TARTRATE 12.5 MG HALF TABLET
12.5000 mg | ORAL_TABLET | Freq: Once | ORAL | Status: AC
  Administered 2016-10-26: 12.5 mg via ORAL
  Filled 2016-10-25: qty 1

## 2016-10-25 MED ORDER — DOPAMINE-DEXTROSE 3.2-5 MG/ML-% IV SOLN
0.0000 ug/kg/min | INTRAVENOUS | Status: DC
  Filled 2016-10-25: qty 250

## 2016-10-25 MED ORDER — SODIUM CHLORIDE 0.9 % IV SOLN
INTRAVENOUS | Status: DC
  Filled 2016-10-25 (×2): qty 30

## 2016-10-25 MED ORDER — METOPROLOL TARTRATE 12.5 MG HALF TABLET: 12.5000 mg | ORAL_TABLET | Freq: Once | ORAL | Status: DC

## 2016-10-25 MED ORDER — BISACODYL 5 MG PO TBEC: 5.0000 mg | DELAYED_RELEASE_TABLET | Freq: Once | ORAL | Status: DC

## 2016-10-25 MED ORDER — NITROGLYCERIN IN D5W 200-5 MCG/ML-% IV SOLN
2.0000 ug/min | INTRAVENOUS | Status: DC
  Filled 2016-10-25: qty 250

## 2016-10-25 MED ORDER — TRANEXAMIC ACID 1000 MG/10ML IV SOLN
1.5000 mg/kg/h | INTRAVENOUS | Status: DC
  Filled 2016-10-25 (×2): qty 25

## 2016-10-25 MED ORDER — TRANEXAMIC ACID (OHS) PUMP PRIME SOLUTION
2.0000 mg/kg | INTRAVENOUS | Status: DC
  Filled 2016-10-25 (×2): qty 1.44

## 2016-10-25 MED ORDER — DEXMEDETOMIDINE HCL IN NACL 400 MCG/100ML IV SOLN
0.1000 ug/kg/h | INTRAVENOUS | Status: DC
  Filled 2016-10-25 (×2): qty 100

## 2016-10-25 MED ORDER — TEMAZEPAM 15 MG PO CAPS: 15.0000 mg | ORAL_CAPSULE | Freq: Once | ORAL | Status: DC | PRN

## 2016-10-25 MED ORDER — DIAZEPAM 2 MG PO TABS
2.0000 mg | ORAL_TABLET | Freq: Once | ORAL | Status: AC
  Administered 2016-10-26: 2 mg via ORAL
  Filled 2016-10-25: qty 1

## 2016-10-25 MED ORDER — SODIUM CHLORIDE 0.9 % IV SOLN
INTRAVENOUS | Status: DC
  Filled 2016-10-25 (×2): qty 1

## 2016-10-25 MED ORDER — PHENYLEPHRINE HCL 10 MG/ML IJ SOLN
30.0000 ug/min | INTRAMUSCULAR | Status: DC
  Filled 2016-10-25 (×2): qty 2

## 2016-10-25 MED ORDER — CHLORHEXIDINE GLUCONATE 0.12 % MT SOLN: 15.0000 mL | Freq: Once | OROMUCOSAL | Status: AC

## 2016-10-25 MED ORDER — VANCOMYCIN HCL 10 G IV SOLR
1250.0000 mg | INTRAVENOUS | Status: DC
  Filled 2016-10-25 (×2): qty 1250

## 2016-10-25 MED ORDER — ALBUTEROL SULFATE (2.5 MG/3ML) 0.083% IN NEBU
2.5000 mg | INHALATION_SOLUTION | Freq: Once | RESPIRATORY_TRACT | Status: AC
  Administered 2016-10-25: 2.5 mg via RESPIRATORY_TRACT

## 2016-10-25 MED ORDER — EPINEPHRINE PF 1 MG/ML IJ SOLN
0.0000 ug/min | INTRAVENOUS | Status: DC
  Filled 2016-10-25 (×2): qty 4

## 2016-10-25 MED ORDER — CHLORHEXIDINE GLUCONATE 0.12 % MT SOLN
15.0000 mL | Freq: Once | OROMUCOSAL | Status: AC
  Administered 2016-10-26: 15 mL via OROMUCOSAL
  Filled 2016-10-25: qty 15

## 2016-10-25 MED ORDER — POTASSIUM CHLORIDE 2 MEQ/ML IV SOLN
80.0000 meq | INTRAVENOUS | Status: DC
  Filled 2016-10-25 (×2): qty 40

## 2016-10-25 MED ORDER — TRANEXAMIC ACID (OHS) BOLUS VIA INFUSION
15.0000 mg/kg | INTRAVENOUS | Status: DC
  Filled 2016-10-25: qty 1082

## 2016-10-25 MED ORDER — CHLORHEXIDINE GLUCONATE CLOTH 2 % EX PADS
6.0000 | MEDICATED_PAD | Freq: Once | CUTANEOUS | Status: AC
  Administered 2016-10-25: 6 via TOPICAL

## 2016-10-25 MED FILL — Verapamil HCl IV Soln 2.5 MG/ML: INTRAVENOUS | Qty: 2 | Status: AC

## 2016-10-25 NOTE — Progress Notes (Signed)
Patient ID: Shawn Collins, male   DOB: [birth year], 66 y.o.   MRN: [id number]   [facility] Progress Note   Assessment/ Plan:   1. 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 Awaiting labs from this morning to assess GFR/electrolytes and plan additional course of action. Will continue close monitoring especially with anticipated CABG for possible worsening renal function. 2. Coronary artery disease/NSTEMI: Coronary angiogram showed severe three-vessel disease and plans for CABG/revascularization noted later this week. He remains on intravenous heparin, aspirin and carvedilol. 3. Hypertension: Blood pressure appears to be relatively well controlled on the current regime of carvedilol, amlodipine and hydralazine with room for dose up titration if needed. 4. Anemia: Likely anemia of chronic disease including chronic kidney disease-low iron saturation noted, will give intravenous iron. 5. Secondary hyperparathyroidism: PTH level within acceptable range for chronic kidney disease stage IV, calcium and phosphorus within goal.  Subjective:   Reports to have had a comfortable night and states that shortness of breath is somewhat better    Objective:   BP (!) 119/41 (BP Location: Right Arm)   Pulse 75   Temp 97.8 F (36.6 C) (Oral)   Resp 13   Ht 5\' 10"  (1.778 m)   Wt 72.1 kg (158 lb 14.4 oz)   SpO2 93%   BMI 22.80 kg/m   Intake/Output Summary (Last 24 hours) at 10/25/16 0850 Last data filed at 10/25/16 0600  Gross per 24 hour  Intake              645 ml  Output              800 ml  Net             -155 ml   Weight change: 0.318 kg (11.2 oz)  Physical Exam: FTD:DUKGURK to be comfortable resting in bed, watching on television CVS: Pulse regular rhythm, normal rate, S1 and S2 normal Resp: Anteriorly clear to auscultation Abd: Soft, flat,  nontender Ext: Trace ankle edema  Imaging: No results found.  Labs: BMET  Recent Labs Lab 10/19/16 0254 10/19/16 1412 10/20/16 0340 10/21/16 0323 10/22/16 0243 10/23/16 0549 10/24/16 0430  NA 135 139  138 135 132* 132* 131* 130*  K 4.0 3.8  3.7 3.8 3.6 3.7 3.7 3.9  CL 103 108  107 105 104 105 105 103  CO2 22 21*  21* 19* 20* 19* 17* 18*  GLUCOSE 180* 144*  144* 172* 172* 193* 197* 208*  BUN 41* 48*  48* 48* 52* 54* 53* 53*  CREATININE 1.91* 2.00*  2.02* 1.93* 2.05* 2.07* 2.14* 2.10*  CALCIUM 8.7* 8.8*  8.9 8.3* 8.0* 7.9* 8.0* 8.4*  PHOS  --   --  3.6 2.4* 2.5 2.9 2.7   CBC  Recent Labs Lab 10/21/16 0323 10/22/16 0243 10/23/16 0548 10/24/16 0430  WBC 9.8 7.7 7.1 6.0  HGB 11.5* 10.2* 10.5* 10.9*  HCT 34.2* 30.6* 31.7* 31.9*  MCV 86.8 86.9 86.1 84.6  PLT 253 249 267 321   Medications:    . amLODipine  5 mg Oral Daily  . aspirin  81 mg Oral Daily  . carvedilol  6.25 mg Oral BID WC  . hydrALAZINE  25 mg Oral Q8H  . insulin aspart  0-5 Units Subcutaneous QHS  . insulin aspart  0-9 Units Subcutaneous TID WC  . pantoprazole  40 mg Oral BID  . polyethylene glycol  17 g  Oral Daily  . rosuvastatin  10 mg Oral q1800  . senna-docusate  1 tablet Oral BID  . sodium chloride flush  3 mL Intravenous Q12H   Elmarie Shiley, MD 10/25/2016, 8:50 AM

## 2016-10-25 NOTE — Progress Notes (Signed)
Progress Note  Patient Name: Shawn Collins Date of Encounter: 10/25/2016  Primary Cardiologist: Dr. Radford Pax  Subjective   Pt says he feels great. Just had a large BM. No chest pain or shortness of breath.  Inpatient Medications    Scheduled Meds: . amLODipine  5 mg Oral Daily  . aspirin  81 mg Oral Daily  . carvedilol  6.25 mg Oral BID WC  . hydrALAZINE  25 mg Oral Q8H  . insulin aspart  0-5 Units Subcutaneous QHS  . insulin aspart  0-9 Units Subcutaneous TID WC  . pantoprazole  40 mg Oral BID  . polyethylene glycol  17 g Oral Daily  . senna-docusate  1 tablet Oral BID  . sodium chloride flush  3 mL Intravenous Q12H   Continuous Infusions: . sodium chloride 5 mL (10/23/16 2000)  . heparin 750 Units/hr (10/24/16 0911)   PRN Meds: sodium chloride, acetaminophen, albuterol, morphine injection, ondansetron (ZOFRAN) IV, sodium chloride flush, zolpidem   Vital Signs    Vitals:   10/24/16 1946 10/24/16 2359 10/25/16 0357 10/25/16 0500  BP: 133/71 125/68 (!) 119/41   Pulse: 68 63 75   Resp: 17 13    Temp: 97.8 F (36.6 C) 98.7 F (37.1 C) 97.8 F (36.6 C)   TempSrc: Oral Oral Oral   SpO2: 94% 93% 93%   Weight:    158 lb 14.4 oz (72.1 kg)  Height:        Intake/Output Summary (Last 24 hours) at 10/25/16 0744 Last data filed at 10/25/16 0405  Gross per 24 hour  Intake            862.5 ml  Output              800 ml  Net             62.5 ml   Filed Weights   10/23/16 0535 10/24/16 0453 10/25/16 0500  Weight: 156 lb 14.4 oz (71.2 kg) 158 lb 3.2 oz (71.8 kg) 158 lb 14.4 oz (72.1 kg)    Telemetry    Sinus rhythm in the 60's  Physical Exam   Physical Exam: Vitals:   10/24/16 1946 10/24/16 2359 10/25/16 0357 10/25/16 0500  BP: 133/71 125/68 (!) 119/41   Pulse: 68 63 75   Resp: 17 13    Temp: 97.8 F (36.6 C) 98.7 F (37.1 C) 97.8 F (36.6 C)   TempSrc: Oral Oral Oral   SpO2: 94% 93% 93%   Weight:    158 lb 14.4 oz (72.1 kg)  Height:       GEN: No  acute distress.   Neck: No JVD Cardiac: RRR, no murmurs, rubs, or gallops.  Respiratory: Clear to auscultation bilaterally. GI: Soft, nontender, non-distended  MS: No edema; No deformity. Neuro:  Nonfocal  Psych: Normal affect    Labs    Chemistry Recent Labs Lab 10/19/16 0254  10/22/16 0243 10/23/16 0549 10/24/16 0430  NA 135  < > 132* 131* 130*  K 4.0  < > 3.7 3.7 3.9  CL 103  < > 105 105 103  CO2 22  < > 19* 17* 18*  GLUCOSE 180*  < > 193* 197* 208*  BUN 41*  < > 54* 53* 53*  CREATININE 1.91*  < > 2.07* 2.14* 2.10*  CALCIUM 8.7*  < > 7.9* 8.0* 8.4*  PROT 6.3*  --   --   --   --   ALBUMIN 3.0*  < > 2.3* 2.3*  2.6*  AST 44*  --   --   --   --   ALT 31  --   --   --   --   ALKPHOS 55  --   --   --   --   BILITOT 0.8  --   --   --   --   GFRNONAA 35*  < > 31* 30* 31*  GFRAA 40*  < > 36* 35* 36*  ANIONGAP 10  < > 8 9 9   < > = values in this interval not displayed.   Hematology  Recent Labs Lab 10/22/16 0243 10/23/16 0548 10/24/16 0430  WBC 7.7 7.1 6.0  RBC 3.52* 3.68* 3.77*  HGB 10.2* 10.5* 10.9*  HCT 30.6* 31.7* 31.9*  MCV 86.9 86.1 84.6  MCH 29.0 28.5 28.9  MCHC 33.3 33.1 34.2  RDW 12.5 12.3 12.4  PLT 249 267 321    Cardiac Enzymes No results for input(s): TROPONINI in the last 168 hours.  No results for input(s): TROPIPOC in the last 168 hours.   BNPNo results for input(s): BNP, PROBNP in the last 168 hours.   DDimer No results for input(s): DDIMER in the last 168 hours.   Cardiac Studies   Cardiac catheterization 10/21/16 Conclusion     Mid RCA lesion, 40 %stenosed.  2nd RPLB lesion, 90 %stenosed.  Ost LAD to Prox LAD lesion, 95 %stenosed.  1st Diag lesion, 85 %stenosed.  Ost Cx to Prox Cx lesion, 70 %stenosed.  Ost 1st Mrg lesion, 90 %stenosed.  LV end diastolic pressure is normal.   1. Severe 3 vessel obstructive CAD. Co-dominant circulation    - complex critical bifurcation LAD/first diagonal stenosis Medina classification  1,1,0    - Bifurcation proximal LCx/OM1 stenosis.    - 90% small PL branch 2. Normal LV function  Plan: Given complex multivessel disease in a diabetic would recommend CT surgery consult.   _________________________________________________________________   Echo 10/16/2016 Study Conclusions  - Left ventricle: The cavity size was normal. Wall thickness was   normal. Systolic function was normal. The estimated ejection   fraction was in the range of 60% to 65%. Wall motion was normal;   there were no regional wall motion abnormalities. Doppler   parameters are consistent with abnormal left ventricular   relaxation (grade 1 diastolic dysfunction). - Mitral valve: There was mild regurgitation. - Pericardium, extracardiac: A trivial pericardial effusion was   identified.   Patient Profile     66 y.o. male with PMH significant for HTN, NIDDM, dyslipidemia, and smoking who presented to the ED with two days of malaise, fatigue, and persistent nausea with bouts of emesis. His ECG does not meet STEMI criteria but could represent subendocardial ischemia.   Assessment & Plan    1. CAD/ NSTEMI - cath with severe 3v vessel CAD - Seen by CT surgeon, plan for CABG Wed or Thurs - Continue on IV heparin/ASA/carvedilol 3.125mg  BID -currently without chest pain or shortness of breath. - start high dose statin - Lipitor 80mg  daily   2. HTN - BP adequately controlled - continue carvedilol 3.125mg  BID, Amlodipine 5mg  daily and hydralazine 25mg  TID.   3. Acute on CKD stage IIIb -Followed by nephrology: "Underlying chronic kidney disease suspected to be from nephrosclerosis-possibly hypertensive/ischemic with acute kidney injury being hemodynamic versus contrast-induced" -Stable, Scr 2.10 today -No change required today  4. Anemia -Presumed related to CKD   Signed, Daune Perch, NP  10/25/2016, 7:44 AM    The  patient was seen, examined and discussed with Daune Perch, NP-C and I  agree with the above.   66 year old male awaiting CABG for severe 3 VD - planned for 5/30 or 5/31.  He is asymptomatic - free of CP and SOB, appears euvolemic. BP is controlled, Crea stable at 2.1. Na 130, diuretics are being held. Hb 10.9 and stable.  Ena Dawley, MD 10/25/2016

## 2016-10-25 NOTE — Progress Notes (Addendum)
Physical Therapy Treatment Patient Details Name: Shawn Collins MRN: [id number] DOB: [birth year] Today's Date: 10/25/2016    History of Present Illness 66 y.o. male admitted to [facility] on 10/15/16 for N/V and abdominal discomfort.  Troponins elevated NSTEMI , 3 vessel CAD and potential CABG PMHx of HTN, DM, COPD, and CKD stage III. Pt is s/p cardiac cath which revealed severe triple vessel disease and is scheduled for CABG on 5/30.     PT Comments    Pt progressing towards goals and tolerating increased ambulation distance. Pt requiring min guard throughout with use of RW. HR generally in 70s throughout and oxygen sats in mid to upper 90s on 2L. Pt with non-sustained V-tach at end of session, however, pt asymptomatic and RN in room. Pt reports he is scheduled for CABG tomorrow. Will continue to follow acutely.   Follow Up Recommendations  Home health PT;Supervision for mobility/OOB     Equipment Recommendations  Rolling walker with 5" wheels    Recommendations for Other Services       Precautions / Restrictions Precautions Precautions: Fall Precaution Comments: watch sats Restrictions Weight Bearing Restrictions: No    Mobility  Bed Mobility               General bed mobility comments: Sitting in recliner upon arrival   Transfers Overall transfer level: Needs assistance Equipment used: Rolling walker (2 wheeled) Transfers: Sit to/from Stand Sit to Stand: Min guard         General transfer comment: Min guard for safety. Demonstrated safe hand placement   Ambulation/Gait Ambulation/Gait assistance: Min guard Ambulation Distance (Feet): 300 Feet Assistive device: Rolling walker (2 wheeled) Gait Pattern/deviations: Step-through pattern;Decreased stride length;Trunk flexed Gait velocity: Decreased Gait velocity interpretation: Below normal speed for age/gender General Gait Details: Verbal cues for upright posture and proximity to device. Oxygen sats maintained at 92-95%  on 2L. HR in 70s throughout.    Stairs            Wheelchair Mobility    Modified Rankin (Stroke Patients Only)       Balance Overall balance assessment: Needs assistance Sitting-balance support: No upper extremity supported;Feet supported Sitting balance-Leahy Scale: Good     Standing balance support: During functional activity;Bilateral upper extremity supported Standing balance-Leahy Scale: Poor Standing balance comment: Reliant on RW for support                             Cognition Arousal/Alertness: Awake/alert Behavior During Therapy: WFL for tasks assessed/performed Overall Cognitive Status: Impaired/Different from baseline                                 General Comments: Much more alert and talkative this session       Exercises      General Comments        Pertinent Vitals/Pain Pain Assessment: No/denies pain    Home Living                      Prior Function            PT Goals (current goals can now be found in the care plan section) Acute Rehab PT Goals PT Goal Formulation: With patient Time For Goal Achievement: 11/02/16 Potential to Achieve Goals: Good Progress towards PT goals: Progressing toward goals    Frequency    Min 3X/week  PT Plan Current plan remains appropriate    Co-evaluation              AM-PAC PT "6 Clicks" Daily Activity  Outcome Measure  Difficulty turning over in bed (including adjusting bedclothes, sheets and blankets)?: None Difficulty moving from lying on back to sitting on the side of the bed? : None Difficulty sitting down on and standing up from a chair with arms (e.g., wheelchair, bedside commode, etc,.)?: A Little Help needed moving to and from a bed to chair (including a wheelchair)?: A Little Help needed walking in hospital room?: A Little Help needed climbing 3-5 steps with a railing? : A Little 6 Click Score: 20    End of Session Equipment  Utilized During Treatment: Gait belt;Oxygen Activity Tolerance: Patient tolerated treatment well Patient left: in chair;with call bell/phone within reach;with chair alarm set;with nursing/sitter in room Nurse Communication: Mobility status PT Visit Diagnosis: Difficulty in walking, not elsewhere classified (R26.2);Muscle weakness (generalized) (M62.81)     Time: 3419-6222 PT Time Calculation (min) (ACUTE ONLY): 17 min  Charges:  $Gait Training: 8-22 mins                    G Codes:       Nicky Pugh, PT, DPT  Acute Rehabilitation Services  Pager: [phone]    Army Melia 10/25/2016, 5:25 PM

## 2016-10-25 NOTE — Progress Notes (Signed)
ANTICOAGULATION CONSULT NOTE - Follow-Up Consult  Pharmacy Consult for heparin Indication: chest pain/ACS  No Known Allergies  Patient Measurements: Height: 5\' 10"  (177.8 cm) Weight: 158 lb 14.4 oz (72.1 kg) IBW/kg (Calculated) : 73 Heparin Dosing Weight: 67kg  Vital Signs: Temp: 97.8 F (36.6 C) (05/29 0357) Temp Source: Oral (05/29 0357) BP: 119/41 (05/29 0357) Pulse Rate: 75 (05/29 0357)  Labs:  Recent Labs  10/23/16 0548 10/23/16 0549  10/23/16 2038 10/24/16 0430 10/25/16 0851  HGB 10.5*  --   --   --  10.9*  --   HCT 31.7*  --   --   --  31.9*  --   PLT 267  --   --   --  321  --   LABPROT  --   --   --   --   --  14.7  INR  --   --   --   --   --  1.14  HEPARINUNFRC  --  0.79*  < > 0.53 0.48 0.34  CREATININE  --  2.14*  --   --  2.10*  --   < > = values in this interval not displayed.  Estimated Creatinine Clearance: 34.8 mL/min (A) (by C-G formula based on SCr of 2.1 mg/dL (H)).  Medical History: Past Medical History:  Diagnosis Date  . Diabetes mellitus without complication ([place])   . Emphysema lung ([place])   . Hyperlipidemia   . Hypertension    Assessment: 37 yoM with multiple cardiac risk factors s/p cath found to have severe 3 vessel CAD. Cardiac surgery planning CABG either 5/30 or 5/31. Heparin resumed 5/26, currently therapeutic at 0.34, CBC stable. No infusion issues or bleeding documented.  Goal of Therapy:  Heparin level 0.3-0.7 units/ml Monitor platelets by anticoagulation protocol: Yes   Plan:  -Continue heparin 750 units/hr -Monitor daily heparin level, CBC, S/Sx bleeding -F/U plans for CABG  Georga Bora, PharmD Clinical Pharmacist 10/25/2016 10:14 AM

## 2016-10-25 NOTE — Progress Notes (Signed)
CARDIAC REHAB PHASE I   PRE:  Rate/Rhythm: 77 SR    BP: sitting 138/78    SaO2: 95 2L  MODE:  Ambulation: 350 ft   POST:  Rate/Rhythm: 89 SR    BP: sitting 152/72     SaO2: 95 2L  Pt on bedpan on arrival. Able to get out of bed and walk with RW. Needed reminders to stand tall. Slow, steady pace, no physical assist needed. Used 2L for support. Sts he was somewhat SOB and fatigued after walk, to recliner. Thankful to be up. Had pt practice IS, performing about 750 mL. Encouraged increase use today.  [id number]   Cole, ACSM 10/25/2016 9:43 AM

## 2016-10-25 NOTE — Progress Notes (Addendum)
      [address]       [place] [address]             [phone]      4 Days Post-Op Procedure(s) (LRB): Left Heart Cath and Coronary Angiography (N/A) Subjective: Feels well without chest pain or SOB  Objective: Vital signs in last 24 hours: Temp:  [97.8 F (36.6 C)-98.7 F (37.1 C)] 97.8 F (36.6 C) (05/29 0357) Pulse Rate:  [63-84] 75 (05/29 0357) Cardiac Rhythm: Normal sinus rhythm (05/29 0700) Resp:  [11-23] 13 (05/28 2359) BP: (119-137)/(41-72) 119/41 (05/29 0357) SpO2:  [88 %-94 %] 93 % (05/29 0357) Weight:  [158 lb 14.4 oz (72.1 kg)] 158 lb 14.4 oz (72.1 kg) (05/29 0500)  Hemodynamic parameters for last 24 hours:    Intake/Output from previous day: 05/28 0701 - 05/29 0700 In: 892.5 [P.O.:720; I.V.:172.5] Out: 800 [Urine:800] Intake/Output this shift: No intake/output data recorded.  General appearance: alert and cooperative Heart: regular rate and rhythm and soft systolic murmur Lungs: dim in lower fields Extremities: Minor edema BLE  Lab Results:  Recent Labs  10/23/16 0548 10/24/16 0430  WBC 7.1 6.0  HGB 10.5* 10.9*  HCT 31.7* 31.9*  PLT 267 321   BMET:  Recent Labs  10/23/16 0549 10/24/16 0430  NA 131* 130*  K 3.7 3.9  CL 105 103  CO2 17* 18*  GLUCOSE 197* 208*  BUN 53* 53*  CREATININE 2.14* 2.10*  CALCIUM 8.0* 8.4*    PT/INR:  Recent Labs  10/25/16 0851  LABPROT 14.7  INR 1.14   ABG    Component Value Date/Time   TCO2 25 06/27/2010 0721   CBG (last 3)   Recent Labs  10/24/16 1631 10/24/16 2134 10/25/16 0727  GLUCAP 190* 115* 128*    Meds Scheduled Meds: . amLODipine  5 mg Oral Daily  . aspirin  81 mg Oral Daily  . carvedilol  6.25 mg Oral BID WC  . hydrALAZINE  25 mg Oral Q8H  . insulin aspart  0-5 Units Subcutaneous QHS  . insulin aspart  0-9 Units Subcutaneous TID WC  . pantoprazole  40 mg Oral BID  . polyethylene glycol  17 g Oral Daily  . rosuvastatin  10 mg Oral q1800  .  senna-docusate  1 tablet Oral BID  . sodium chloride flush  3 mL Intravenous Q12H   Continuous Infusions: . sodium chloride 5 mL (10/23/16 2000)  . ferumoxytol    . heparin 750 Units/hr (10/24/16 0911)   PRN Meds:.sodium chloride, acetaminophen, albuterol, morphine injection, ondansetron (ZOFRAN) IV, sodium chloride flush, zolpidem  Xrays No results found.  Assessment/Plan: S/P Procedure(s) (LRB): Left Heart Cath and Coronary Angiography (N/A)  1 stable on current rx 2 creat pending for today- nephrology is consulting 3 surgery date TBD as medical condition cont to be optimized   LOS: 9 days    GOLD,WAYNE E 10/25/2016  see my note after review of films with cardiology I have seen and examined Shawn Collins and agree with the above assessment  and plan.  Grace Isaac MD Beeper [phone] Office [phone] [place] 10/25/2016 3:33 PM

## 2016-10-25 NOTE — Progress Notes (Signed)
[facility] Progress Note  Patient: Shawn Collins YYQ:[id number]   PCP: Nolene Ebbs, MD DOB: [birth year]   DOA: 10/15/2016   DOS: 10/25/2016   Date of Service: the patient was seen and examined on 10/25/2016  Subjective: Denies having any acute complaint. Had a discussion with the thoracic surgery. No chest pain or shortness of breath. No nausea no vomiting. Out of bed and chair.  Brief hospital course: Pt. with PMH of type II DM, HTN, dyslipidemia; admitted on 10/15/2016, presented with complaint of nausea vomiting, was found to have viral gastroenteritis and NSTEMI. Underwent cardiac catheterization, showed high risk LAD stenosis along with multivessel disease. Not a candidate for PCI due to anatomy. Patient will be undergoing high-risk CABG sometime this week. Currently further plan is for CABG tomorrow and postoperative recovery  Assessment and Plan: 1. Nausea vomiting and abdominal pain. Likely viral gastroenteritis. Possibility of gastroparesis CT abdomen pelvis unremarkable, x-ray abdomen unremarkable. Right upper quadrant ultrasound shows Dilated CBD which is unchanged from prior ultrasound. LFT stable no indication for acute intervention. Continue supportive measures, was on scheduled Reglan. Now stopped and still feeling better. Continue Stool softener.  2. Non-STEMI. Abnormal stress test. Severe triple vessel CAD Cardiology consulted, S/P cardiac catheterization- severe triple-vessel disease, patient will need bypass surgery, cardiothoracic onboard. although serum creatinine worsening prior to cath, now stable.  Continue on amlodipine, carvedilol, no indication for diuretics at present. Echo 60-65% EF. PFT completed and vein mapping done.   3. Acute on chronic kidney disease stage IV. Renal function progressively worsening. Now stable. Due to poor by mouth intake as well as continues use of lisinopril. Avoid any further nephrotoxic medication, continue IV  hydration. Urine output remains adequate, patient actually negative by ins and outs calculation although unsure whether they are accurate. Blood pressure was actually elevated. SPEP Unremarkable.  CT abdomen does not show any obstruction but does show perinephric edema which is unchanged from prior years. UA x2 bland. Nephrology consulted for further input , appreciate their input  4. Type II DM. Uncontrolled. Hyperglycemia. On Amaryl at home, hemoglobin A1c 10.1. Continue sliding scale insulin, Oral hypoglycemic agents as well as long-acting insulin is currently on hold due to poor by mouth intake. diabetic gastroparesis causing nausea and vomiting cannot be ruled out.  5.Fever with chills. Extensive workup performed, urine culture unremarkable  urine analysis unremarkable.  CT abdomen pelvis unremarkable other than perinephric edema but no evidence of UTI. X-ray chest unremarkable. Ultrasound abdomen unremarkable. ESR, CRP, Pro calcitonin level unremarkable. Blood culture negative, no indication for further workup or treatment. Mild mild leukocytosis, continue current monitoring  6. Bilateral basal crackles. Atelectasis, currently resolved. Patient is going to get incentive spirometry. Recommend to ambulate as well as stay upright. Continue oxygen as needed.  7. Essential hypertension.  Accelerated hypertension. Patient treated with elevated blood pressure, likely his AKI is also associated with hypertensive nephropathy. Amlodipine changed from 10 mg to 5 mg daily.  Diet: cardiac and carb modified diet DVT Prophylaxis: on therapeutic anticoagulation.  Advance goals of care discussion: full code  Family Communication: no family was present at bedside, at the time of interview.  Disposition:  Discharge to be decided.  Consultants: cardiology, nephrology, cardiothoracic surgery Procedures: Echocardiogram, cardiac cath  Antibiotics: Anti-infectives    Start      Dose/Rate Route Frequency Ordered Stop   10/26/16 0400  vancomycin (VANCOCIN) 1,250 mg in sodium chloride 0.9 % 250 mL IVPB     1,250 mg 166.7 mL/hr over 90 Minutes  Intravenous To Surgery 10/25/16 1546 10/27/16 0400   10/26/16 0400  cefUROXime (ZINACEF) 1.5 g in dextrose 5 % 50 mL IVPB     1.5 g 100 mL/hr over 30 Minutes Intravenous To Surgery 10/25/16 1546 10/27/16 0400   10/26/16 0400  cefUROXime (ZINACEF) 750 mg in dextrose 5 % 50 mL IVPB     750 mg 100 mL/hr over 30 Minutes Intravenous To Surgery 10/25/16 1546 10/27/16 0400     Objective: Physical Exam: Vitals:   10/25/16 0500 10/25/16 1049 10/25/16 1050 10/25/16 1237  BP:  (!) 152/72 (!) 152/72 (!) 145/76  Pulse:  79    Resp:      Temp:    98 F (36.7 C)  TempSrc:    Oral  SpO2:    97%  Weight: 72.1 kg (158 lb 14.4 oz)     Height:        Intake/Output Summary (Last 24 hours) at 10/25/16 1643 Last data filed at 10/25/16 0600  Gross per 24 hour  Intake              225 ml  Output              800 ml  Net             -575 ml   Filed Weights   10/23/16 0535 10/24/16 0453 10/25/16 0500  Weight: 71.2 kg (156 lb 14.4 oz) 71.8 kg (158 lb 3.2 oz) 72.1 kg (158 lb 14.4 oz)   General: Alert, Awake and Oriented to Time, Place and Person. Appear in mild distress, affect appropriate ENT: Oral Mucosa clear moist. Neck: no JVD, no Abnormal Mass Or lumps Cardiovascular: S1 and S2 Present, no Murmur, Peripheral Pulses Present Respiratory: normal respiratory effort, Bilateral Air entry equal and Decreased, no use of accessory muscle, Clear to Auscultation, no Crackles, no wheezes Abdomen: Bowel Sound present, Soft and no tenderness, Skin: no redness, no Rash, no induration Extremities: no Pedal edema, no calf tenderness Neurologic: Grossly no focal neuro deficit. Bilaterally Equal motor strength  Data Reviewed: CBC:  Recent Labs Lab 10/20/16 0340 10/21/16 0323 10/22/16 0243 10/23/16 0548 10/24/16 0430  WBC 12.1* 9.8 7.7 7.1  6.0  HGB 11.4* 11.5* 10.2* 10.5* 10.9*  HCT 34.7* 34.2* 30.6* 31.7* 31.9*  MCV 87.8 86.8 86.9 86.1 84.6  PLT 278 253 249 267 539   Basic Metabolic Panel:  Recent Labs Lab 10/20/16 0340 10/21/16 0323 10/22/16 0243 10/23/16 0549 10/24/16 0430 10/25/16 0851  NA 135 132* 132* 131* 130* 131*  K 3.8 3.6 3.7 3.7 3.9 4.3  CL 105 104 105 105 103 102  CO2 19* 20* 19* 17* 18* 19*  GLUCOSE 172* 172* 193* 197* 208* 181*  BUN 48* 52* 54* 53* 53* 43*  CREATININE 1.93* 2.05* 2.07* 2.14* 2.10* 1.99*  CALCIUM 8.3* 8.0* 7.9* 8.0* 8.4* 8.4*  MG 2.2  --   --   --   --   --   PHOS 3.6 2.4* 2.5 2.9 2.7  --     Liver Function Tests:  Recent Labs Lab 10/19/16 0254  10/21/16 0323 10/22/16 0243 10/23/16 0549 10/24/16 0430 10/25/16 0851  AST 44*  --   --   --   --   --  23  ALT 31  --   --   --   --   --  19  ALKPHOS 55  --   --   --   --   --  42  BILITOT  0.8  --   --   --   --   --  0.5  PROT 6.3*  --   --   --   --   --  5.8*  ALBUMIN 3.0*  < > 2.5* 2.3* 2.3* 2.6* 2.6*  < > = values in this interval not displayed.  Recent Labs Lab 10/19/16 0254  LIPASE 16   No results for input(s): AMMONIA in the last 168 hours. Coagulation Profile:  Recent Labs Lab 10/19/16 0254 10/25/16 0851  INR 1.22 1.14   Cardiac Enzymes: No results for input(s): CKTOTAL, CKMB, CKMBINDEX, TROPONINI in the last 168 hours. BNP (last 3 results) No results for input(s): PROBNP in the last 8760 hours. CBG:  Recent Labs Lab 10/24/16 1631 10/24/16 2134 10/25/16 0727 10/25/16 1236 10/25/16 1626  GLUCAP 190* 115* 128* 221* 283*   Studies: No results found.  Scheduled Meds: . amLODipine  5 mg Oral Daily  . aspirin  81 mg Oral Daily  . carvedilol  6.25 mg Oral BID WC  . [START ON 10/26/2016] heparin-papaverine-plasmalyte irrigation   Irrigation To OR  . hydrALAZINE  25 mg Oral Q8H  . insulin aspart  0-5 Units Subcutaneous QHS  . insulin aspart  0-9 Units Subcutaneous TID WC  . [START ON 10/26/2016]  magnesium sulfate  40 mEq Other To OR  . pantoprazole  40 mg Oral BID  . polyethylene glycol  17 g Oral Daily  . [START ON 10/26/2016] potassium chloride  80 mEq Other To OR  . rosuvastatin  10 mg Oral q1800  . senna-docusate  1 tablet Oral BID  . sodium chloride flush  3 mL Intravenous Q12H  . [START ON 10/26/2016] tranexamic acid  15 mg/kg Intravenous To OR  . [START ON 10/26/2016] tranexamic acid  2 mg/kg Intracatheter To OR   Continuous Infusions: . sodium chloride 5 mL (10/23/16 2000)  . [START ON 10/26/2016] cefUROXime (ZINACEF)  IV    . [START ON 10/26/2016] cefUROXime (ZINACEF)  IV    . [START ON 10/26/2016] dexmedetomidine    . [START ON 10/26/2016] DOPamine    . [START ON 10/26/2016] epinephrine    . ferumoxytol    . [START ON 10/26/2016] heparin 30,000 units/NS 1000 mL solution for CELLSAVER    . heparin 750 Units/hr (10/24/16 0911)  . [START ON 10/26/2016] insulin (NOVOLIN-R) infusion    . [START ON 10/26/2016] nitroGLYCERIN    . [START ON 10/26/2016] phenylephrine 73m/250mL NS (0.040mml) infusion    . [START ON 10/26/2016] tranexamic acid (CYKLOKAPRON) infusion ([facility])    . [START ON 10/26/2016] vancomycin     PRN Meds: sodium chloride, acetaminophen, albuterol, morphine injection, ondansetron (ZOFRAN) IV, sodium chloride flush, zolpidem  Time spent: 30 minutes  Author: PrBerle MullMD [facility] Pager: [phone] 4589/29/2018 4:43 PM  If 7PM-7AM, please contact night-coverage at [email or web address], password [email or web address]

## 2016-10-25 NOTE — Care Management Important Message (Signed)
Important Message  Patient Details  Name: Shawn Collins MRN: [id number] Date of Birth: 06/05/1950   Medicare Important Message Given:  Yes    Trey Gulbranson Abena 10/25/2016, 1:16 PM

## 2016-10-25 NOTE — Progress Notes (Signed)
[address]       [place] [address]             [phone]                 4 Days Post-Op Procedure(s) (LRB): Left Heart Cath and Coronary Angiography (N/A)  LOS: 9 days   Subjective: No chest pain, no abdominal pain , Patient had PFT done. He notes he walks a lot before admission without SOB, denies wheezing, smoked from age 66 to 68 , none since ,  Objective: Vital signs in last 24 hours: Patient Vitals for the past 24 hrs:  BP Temp Temp src Pulse Resp SpO2 Weight  10/25/16 1237 (!) 145/76 98 F (36.7 C) Oral - - 97 % -  10/25/16 1050 (!) 152/72 - - - - - -  10/25/16 1049 (!) 152/72 - - 79 - - -  10/25/16 0500 - - - - - - 158 lb 14.4 oz (72.1 kg)  10/25/16 0357 (!) 119/41 97.8 F (36.6 C) Oral 75 - 93 % -  10/24/16 2359 125/68 98.7 F (37.1 C) Oral 63 13 93 % -  10/24/16 1946 133/71 97.8 F (36.6 C) Oral 68 17 94 % -  10/24/16 1631 129/69 98 F (36.7 C) Oral 69 16 91 % -  10/24/16 1600 - - - - 11 - -    Filed Weights   10/23/16 0535 10/24/16 0453 10/25/16 0500  Weight: 156 lb 14.4 oz (71.2 kg) 158 lb 3.2 oz (71.8 kg) 158 lb 14.4 oz (72.1 kg)    Hemodynamic parameters for last 24 hours:    Intake/Output from previous day: 05/28 0701 - 05/29 0700 In: 892.5 [P.O.:720; I.V.:172.5] Out: 800 [Urine:800] Intake/Output this shift: No intake/output data recorded.  Scheduled Meds: . amLODipine  5 mg Oral Daily  . aspirin  81 mg Oral Daily  . carvedilol  6.25 mg Oral BID WC  . hydrALAZINE  25 mg Oral Q8H  . insulin aspart  0-5 Units Subcutaneous QHS  . insulin aspart  0-9 Units Subcutaneous TID WC  . pantoprazole  40 mg Oral BID  . polyethylene glycol  17 g Oral Daily  . rosuvastatin  10 mg Oral q1800  . senna-docusate  1 tablet Oral BID  . sodium chloride flush  3 mL Intravenous Q12H   Continuous Infusions: . sodium chloride 5 mL (10/23/16 2000)  . ferumoxytol    . heparin 750 Units/hr (10/24/16 0911)   PRN Meds:.sodium chloride,  acetaminophen, albuterol, morphine injection, ondansetron (ZOFRAN) IV, sodium chloride flush, zolpidem  General appearance: alert, cooperative, appears older than stated age and no distress Neurologic: intact Heart: regular rate and rhythm, S1, S2 normal, no murmur, click, rub or gallop Lungs: diminished breath sounds bibasilar Abdomen: soft, non-tender; bowel sounds normal; no masses,  no organomegaly Extremities: extremities normal, atraumatic, no cyanosis or edema and Homans sign is negative, no sign of DVT  Lab Results: CBC: Recent Labs  10/23/16 0548 10/24/16 0430  WBC 7.1 6.0  HGB 10.5* 10.9*  HCT 31.7* 31.9*  PLT 267 321   BMET:  Recent Labs  10/24/16 0430 10/25/16 0851  NA 130* 131*  K 3.9 4.3  CL 103 102  CO2 18* 19*  GLUCOSE 208* 181*  BUN 53* 43*  CREATININE 2.10* 1.99*  CALCIUM 8.4* 8.4*    PT/INR:  Recent Labs  10/25/16 0851  LABPROT 14.7  INR 1.14     Radiology Ct  Abdomen Pelvis Wo Contrast  Result Date: 10/16/2016 CLINICAL DATA:  Nausea and vomiting. EXAM: CT ABDOMEN AND PELVIS WITHOUT CONTRAST TECHNIQUE: Multidetector CT imaging of the abdomen and pelvis was performed following the standard protocol without IV contrast. COMPARISON:  Contrast-enhanced CT 06/27/2010 FINDINGS: Lower chest: Breathing motion artifact. No consolidation or pleural effusion. Small hiatal hernia. Hepatobiliary: No evidence focal lesion allowing for lack contrast. Gallbladder physiologically distended, no calcified stone. No biliary dilatation. Pancreas: No ductal dilatation or inflammation. Lack of contrast and paucity of intra-abdominal fat limits detailed assessment. Spleen: Normal in size without focal abnormality. Adrenals/Urinary Tract: No adrenal nodule. There is bilateral perinephric edema, right greater than left. This is seen on remote prior exam. No urolithiasis. No hydronephrosis. Ureters are decompressed. Urinary bladder is distended, no bladder wall thickening.  Stomach/Bowel: Moderate stool throughout the colon with stool distending the rectum, question constipation. Small bowel is decompressed and not well evaluated. The stomach is decompressed. Small hiatal hernia. Portions of the appendix are tentatively identified, no pericecal or right lower quadrant inflammation to suggest appendicitis. Vascular/Lymphatic: Aortic atherosclerosis without aneurysm. No bulky adenopathy, and decreased sensitivity given lack of contrast and paucity of intra-abdominal fat. Reproductive: There is a well-defined 3.3 x 2.4 cm water density lesion in the right upper abdomen abutting the distal stomach. No surrounding inflammation. This is unchanged in size from prior exam. Other: No free fluid. No free air. Tiny fat containing umbilical hernia. Musculoskeletal: There are no acute or suspicious osseous abnormalities. IMPRESSION: 1. Perinephric edema about both kidneys, may be chronic and was seen on prior exam. This suggest underlying urinary tract infection or renal inflammation. No urolithiasis. 2. Well-defined fluid density structure in the right upper abdomen measuring 3.3 cm, unchanged from prior exam. This is likely duplication or peritoneal cyst. No inflammation. Unchanged size of a course of 6 years is consistent with a benign etiology. 3. Aortic atherosclerosis.  No aneurysm. 4. Moderate stool burden throughout the colon, suggesting constipation. No obstruction. Electronically Signed   By: Jeb Levering M.D.   On: 10/16/2016 04:24   Nm Myocar Multi W/spect W/wall Motion / Ef  Result Date: 10/18/2016  There was no ST segment deviation noted during stress.  Defect 1: There is a large defect of moderate severity present in the basal inferoseptal, basal inferior, mid inferoseptal, mid inferior, apical septal and apical inferior location.  Defect 2: There is a large defect of severe severity present in the basal anterior, basal anteroseptal, mid anterior, mid anteroseptal, apical  anterior, apical septal and apex location.  Findings consistent with prior myocardial infarction in the LAD and RCA regions with very mild peri-infarct ischemia in the apical inferior and apical septum.  This is an intermediate risk study due to reduced systolic function.  The left ventricular ejection fraction is moderately decreased (30-44%).    Dg Chest Portable 1 View  Result Date: 10/16/2016 CLINICAL DATA:  Weakness, nausea and vomiting x2 days EXAM: PORTABLE CHEST 1 VIEW COMPARISON:  09/11/2014 CXR FINDINGS: The heart size and mediastinal contours are within normal limits. Both lungs are clear. The visualized skeletal structures are unremarkable. IMPRESSION: No active disease. Electronically Signed   By: Ashley Royalty M.D.   On: 10/16/2016 00:45   Dg Abd Portable 1v  Result Date: 10/16/2016 CLINICAL DATA:  Patient reports abdominal discomfort, patient reports he thinks it may be from vomiting X 2 days. EXAM: PORTABLE ABDOMEN - 1 VIEW COMPARISON:  CT, 10/16/2016 FINDINGS: There is no bowel dilation to suggest obstruction or  adynamic ileus. No evidence of renal or ureteral stones. Soft tissues are unremarkable. No acute skeletal abnormality. IMPRESSION: No acute findings. Electronically Signed   By: Lajean Manes M.D.   On: 10/16/2016 11:33   US Abdomen Limited Ruq  Result Date: 10/18/2016 CLINICAL DATA:  66 year old diabetic hypertensive male with persistent nausea since 10/15/2016. Subsequent encounter. EXAM: US ABDOMEN LIMITED - RIGHT UPPER QUADRANT COMPARISON:  10/16/2016 CT. FINDINGS: Gallbladder: No gallstones or wall thickening visualized. No sonographic Murphy sign noted by sonographer. Common bile duct: Diameter: 7.7 mm. Distal aspect not visualized secondary to bowel gas Liver: Mild intrahepatic biliary duct dilation.  No focal hepatic lesion. IMPRESSION: Gallbladder unremarkable. Slight prominence of common bile duct (7.7 mm) and mild intrahepatic biliary duct dilation suspected.  Correlation with liver function studies recommended. Electronically Signed   By: Genia Del M.D.   On: 10/18/2016 14:19   PFT"s FEV1 1.04  35% DLCO 9.9 30 % Interpretation: Although the FEV1 and FEF25-75% are reduced, the FEV1/FVC ratio is increased. The expiratory time was less than six seconds, which may underestimate the degree of obstruction. The FVC is reduced relative to the SVC indicating air trapping. The airway resistance is normal. The lung volumes are reduced. Following administration of bronchodilators, there is no significant response. The reduced diffusing capacity indicates a severe loss of functional alveolar capillary surface. Conclusions: Severe airway obstruction is present. The reduced lung volumes, increased FEV1/FVC ratio and diffusion defect suggest an interstitial process such as fibrosis or interstitial inflammation. A clinical trial of bronchodilators may be beneficial in view of the airway obstruction. In view of the severity of the diffusion defect, studies with exercise would be helpful to evaluate the presence of hypoxemia. 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 Medical treatment with critical anatomy especially of lad will not be successful. Patient aware of increased risk for CABG but is will to proceed. He has dicussed planned surgery with his sister who loves out of town.   The goals risks and alternatives of the planned surgical procedure Procedure(s): CABG have been discussed with the patient in detail. The risks of the procedure  including death, infection, stroke, myocardial infarction, bleeding, blood transfusion, respiratory failure  have all been discussed specifically.  I have quoted Shawn Collins a 6 % of perioperative mortality and a complication rate as high as 50 %. The patient's questions have been answered.Shawn Collins is willing  to proceed with the planned procedure. Grace Isaac MD 10/25/2016 3:33 PM     Patient ID: Shawn Collins, male   DOB: Jul 14, 1950, 66 y.o.   MRN: [id number]

## 2016-10-26 ENCOUNTER — Encounter (HOSPITAL_COMMUNITY)
Admission: EM | Disposition: A | Discharge status: Skilled Nursing Facility | Payer: Self-pay | Source: Home / Self Care | Attending: Cardiothoracic Surgery

## 2016-10-26 ENCOUNTER — Inpatient Hospital Stay (HOSPITAL_COMMUNITY): Payer: Medicare Other | Admitting: Anesthesiology

## 2016-10-26 ENCOUNTER — Inpatient Hospital Stay (HOSPITAL_COMMUNITY): Payer: Medicare Other

## 2016-10-26 ENCOUNTER — Encounter (HOSPITAL_COMMUNITY): Payer: Self-pay | Admitting: Surgery

## 2016-10-26 DIAGNOSIS — R040 Epistaxis: Secondary | ICD-10-CM

## 2016-10-26 DIAGNOSIS — Z951 Presence of aortocoronary bypass graft: Secondary | ICD-10-CM

## 2016-10-26 DIAGNOSIS — R042 Hemoptysis: Secondary | ICD-10-CM

## 2016-10-26 DIAGNOSIS — I25110 Atherosclerotic heart disease of native coronary artery with unstable angina pectoris: Secondary | ICD-10-CM

## 2016-10-26 DIAGNOSIS — A084 Viral intestinal infection, unspecified: Secondary | ICD-10-CM

## 2016-10-26 HISTORY — PX: CORONARY ARTERY BYPASS GRAFT: SHX141

## 2016-10-26 HISTORY — PX: ENDOVEIN HARVEST OF GREATER SAPHENOUS VEIN: SHX5059

## 2016-10-26 HISTORY — PX: TEE WITHOUT CARDIOVERSION: SHX5443

## 2016-10-26 LAB — POCT I-STAT, CHEM 8
BUN: 31 mg/dL — AB (ref 6–20)
BUN: 28 mg/dL — ABNORMAL HIGH (ref 6–20)
BUN: 29 mg/dL — ABNORMAL HIGH (ref 6–20)
BUN: 27 mg/dL — AB (ref 6–20)
BUN: 29 mg/dL — ABNORMAL HIGH (ref 6–20)
CALCIUM ION: 1.15 mmol/L (ref 1.15–1.40)
CALCIUM ION: 1.21 mmol/L (ref 1.15–1.40)
CHLORIDE: 104 mmol/L (ref 101–111)
CHLORIDE: 102 mmol/L (ref 101–111)
CHLORIDE: 103 mmol/L (ref 101–111)
CHLORIDE: 101 mmol/L (ref 101–111)
CREATININE: 1.5 mg/dL — AB (ref 0.61–1.24)
CREATININE: 1.2 mg/dL (ref 0.61–1.24)
Calcium, Ion: 1.26 mmol/L (ref 1.15–1.40)
Calcium, Ion: 1.25 mmol/L (ref 1.15–1.40)
Calcium, Ion: 1.17 mmol/L (ref 1.15–1.40)
Chloride: 102 mmol/L (ref 101–111)
Creatinine, Ser: 1.3 mg/dL — ABNORMAL HIGH (ref 0.61–1.24)
Creatinine, Ser: 1.4 mg/dL — ABNORMAL HIGH (ref 0.61–1.24)
Creatinine, Ser: 1.4 mg/dL — ABNORMAL HIGH (ref 0.61–1.24)
GLUCOSE: 188 mg/dL — AB (ref 65–99)
GLUCOSE: 170 mg/dL — AB (ref 65–99)
GLUCOSE: 171 mg/dL — AB (ref 65–99)
Glucose, Bld: 239 mg/dL — ABNORMAL HIGH (ref 65–99)
Glucose, Bld: 141 mg/dL — ABNORMAL HIGH (ref 65–99)
HCT: 22 % — ABNORMAL LOW (ref 39.0–52.0)
HCT: 26 % — ABNORMAL LOW (ref 39.0–52.0)
HCT: 28 % — ABNORMAL LOW (ref 39.0–52.0)
HCT: 29 % — ABNORMAL LOW (ref 39.0–52.0)
HEMATOCRIT: 27 % — AB (ref 39.0–52.0)
HEMOGLOBIN: 9.2 g/dL — AB (ref 13.0–17.0)
HEMOGLOBIN: 8.8 g/dL — AB (ref 13.0–17.0)
Hemoglobin: 7.5 g/dL — ABNORMAL LOW (ref 13.0–17.0)
Hemoglobin: 9.5 g/dL — ABNORMAL LOW (ref 13.0–17.0)
Hemoglobin: 9.9 g/dL — ABNORMAL LOW (ref 13.0–17.0)
POTASSIUM: 4.2 mmol/L (ref 3.5–5.1)
POTASSIUM: 4.7 mmol/L (ref 3.5–5.1)
Potassium: 4.3 mmol/L (ref 3.5–5.1)
Potassium: 4.6 mmol/L (ref 3.5–5.1)
Potassium: 3.9 mmol/L (ref 3.5–5.1)
SODIUM: 138 mmol/L (ref 135–145)
Sodium: 135 mmol/L (ref 135–145)
Sodium: 135 mmol/L (ref 135–145)
Sodium: 135 mmol/L (ref 135–145)
Sodium: 132 mmol/L — ABNORMAL LOW (ref 135–145)
TCO2: 22 mmol/L (ref 0–100)
TCO2: 24 mmol/L (ref 0–100)
TCO2: 24 mmol/L (ref 0–100)
TCO2: 26 mmol/L (ref 0–100)
TCO2: 20 mmol/L (ref 0–100)

## 2016-10-26 LAB — POCT I-STAT 3, ART BLOOD GAS (G3+)
ACID-BASE DEFICIT: 3 mmol/L — AB (ref 0.0–2.0)
Acid-base deficit: 2 mmol/L (ref 0.0–2.0)
Acid-base deficit: 1 mmol/L (ref 0.0–2.0)
BICARBONATE: 23 mmol/L (ref 20.0–28.0)
Bicarbonate: 23 mmol/L (ref 20.0–28.0)
Bicarbonate: 20.6 mmol/L (ref 20.0–28.0)
O2 SAT: 100 %
O2 SAT: 99 %
O2 Saturation: 100 %
PCO2 ART: 33.8 mmHg (ref 32.0–48.0)
PH ART: 7.453 — AB (ref 7.350–7.450)
Patient temperature: 35.4
TCO2: 24 mmol/L (ref 0–100)
TCO2: 24 mmol/L (ref 0–100)
TCO2: 22 mmol/L (ref 0–100)
pCO2 arterial: 40.8 mmHg (ref 32.0–48.0)
pCO2 arterial: 29 mmHg — ABNORMAL LOW (ref 32.0–48.0)
pH, Arterial: 7.36 (ref 7.350–7.450)
pH, Arterial: 7.441 (ref 7.350–7.450)
pO2, Arterial: 342 mmHg — ABNORMAL HIGH (ref 83.0–108.0)
pO2, Arterial: 335 mmHg — ABNORMAL HIGH (ref 83.0–108.0)
pO2, Arterial: 101 mmHg (ref 83.0–108.0)

## 2016-10-26 LAB — CBC
HCT: 31.5 % — ABNORMAL LOW (ref 39.0–52.0)
HCT: 28.6 % — ABNORMAL LOW (ref 39.0–52.0)
HEMATOCRIT: 32.4 % — AB (ref 39.0–52.0)
HEMOGLOBIN: 10.6 g/dL — AB (ref 13.0–17.0)
Hemoglobin: 11.2 g/dL — ABNORMAL LOW (ref 13.0–17.0)
Hemoglobin: 9.9 g/dL — ABNORMAL LOW (ref 13.0–17.0)
MCH: 29.2 pg (ref 26.0–34.0)
MCH: 29.3 pg (ref 26.0–34.0)
MCH: 29.3 pg (ref 26.0–34.0)
MCHC: 33.7 g/dL (ref 30.0–36.0)
MCHC: 34.6 g/dL (ref 30.0–36.0)
MCHC: 34.6 g/dL (ref 30.0–36.0)
MCV: 86.8 fL (ref 78.0–100.0)
MCV: 84.8 fL (ref 78.0–100.0)
MCV: 84.6 fL (ref 78.0–100.0)
PLATELETS: 357 10*3/uL (ref 150–400)
Platelets: 147 10*3/uL — ABNORMAL LOW (ref 150–400)
Platelets: 148 10*3/uL — ABNORMAL LOW (ref 150–400)
RBC: 3.63 MIL/uL — AB (ref 4.22–5.81)
RBC: 3.82 MIL/uL — ABNORMAL LOW (ref 4.22–5.81)
RBC: 3.38 MIL/uL — ABNORMAL LOW (ref 4.22–5.81)
RDW: 12.8 % (ref 11.5–15.5)
RDW: 12.6 % (ref 11.5–15.5)
RDW: 12.6 % (ref 11.5–15.5)
WBC: 6.7 10*3/uL (ref 4.0–10.5)
WBC: 10.8 10*3/uL — ABNORMAL HIGH (ref 4.0–10.5)
WBC: 15 10*3/uL — ABNORMAL HIGH (ref 4.0–10.5)

## 2016-10-26 LAB — PROTIME-INR
INR: 1.47
Prothrombin Time: 18 seconds — ABNORMAL HIGH (ref 11.4–15.2)

## 2016-10-26 LAB — BLOOD GAS, ARTERIAL
ACID-BASE DEFICIT: 3.3 mmol/L — AB (ref 0.0–2.0)
Bicarbonate: 20.8 mmol/L (ref 20.0–28.0)
Drawn by: 36527
FIO2: 21
O2 SAT: 91.6 %
PATIENT TEMPERATURE: 98.6
PCO2 ART: 34.3 mmHg (ref 32.0–48.0)
pH, Arterial: 7.399 (ref 7.350–7.450)
pO2, Arterial: 61.9 mmHg — ABNORMAL LOW (ref 83.0–108.0)

## 2016-10-26 LAB — GLUCOSE, CAPILLARY
GLUCOSE-CAPILLARY: 102 mg/dL — AB (ref 65–99)
GLUCOSE-CAPILLARY: 133 mg/dL — AB (ref 65–99)
GLUCOSE-CAPILLARY: 268 mg/dL — AB (ref 65–99)
GLUCOSE-CAPILLARY: 94 mg/dL (ref 65–99)
Glucose-Capillary: 81 mg/dL (ref 65–99)
Glucose-Capillary: 74 mg/dL (ref 65–99)
Glucose-Capillary: 83 mg/dL (ref 65–99)
Glucose-Capillary: 140 mg/dL — ABNORMAL HIGH (ref 65–99)

## 2016-10-26 LAB — HEMOGLOBIN AND HEMATOCRIT, BLOOD
HCT: 26.6 % — ABNORMAL LOW (ref 39.0–52.0)
Hemoglobin: 9.2 g/dL — ABNORMAL LOW (ref 13.0–17.0)

## 2016-10-26 LAB — BASIC METABOLIC PANEL
Anion gap: 6 (ref 5–15)
BUN: 39 mg/dL — ABNORMAL HIGH (ref 6–20)
CO2: 21 mmol/L — ABNORMAL LOW (ref 22–32)
Calcium: 8.5 mg/dL — ABNORMAL LOW (ref 8.9–10.3)
Chloride: 104 mmol/L (ref 101–111)
Creatinine, Ser: 1.89 mg/dL — ABNORMAL HIGH (ref 0.61–1.24)
GFR calc Af Amer: 41 mL/min — ABNORMAL LOW (ref >60)
GFR calc non Af Amer: 35 mL/min — ABNORMAL LOW (ref >60)
Glucose, Bld: 265 mg/dL — ABNORMAL HIGH (ref 65–99)
Potassium: 4.1 mmol/L (ref 3.5–5.1)
Sodium: 131 mmol/L — ABNORMAL LOW (ref 135–145)

## 2016-10-26 LAB — CREATININE, SERUM
Creatinine, Ser: 1.45 mg/dL — ABNORMAL HIGH (ref 0.61–1.24)
GFR calc Af Amer: 56 mL/min — ABNORMAL LOW (ref >60)
GFR calc non Af Amer: 48 mL/min — ABNORMAL LOW (ref >60)

## 2016-10-26 LAB — PLATELET COUNT: Platelets: 203 10*3/uL (ref 150–400)

## 2016-10-26 LAB — POCT I-STAT 4, (NA,K, GLUC, HGB,HCT)
GLUCOSE: 115 mg/dL — AB (ref 65–99)
HCT: 30 % — ABNORMAL LOW (ref 39.0–52.0)
HEMOGLOBIN: 10.2 g/dL — AB (ref 13.0–17.0)
POTASSIUM: 3.6 mmol/L (ref 3.5–5.1)
Sodium: 139 mmol/L (ref 135–145)

## 2016-10-26 LAB — APTT
aPTT: 75 seconds — ABNORMAL HIGH (ref 24–36)
aPTT: 31 seconds (ref 24–36)

## 2016-10-26 LAB — SURGICAL PCR SCREEN
MRSA, PCR: NEGATIVE
STAPHYLOCOCCUS AUREUS: NEGATIVE

## 2016-10-26 LAB — HEPARIN LEVEL (UNFRACTIONATED): HEPARIN UNFRACTIONATED: 0.44 [IU]/mL (ref 0.30–0.70)

## 2016-10-26 LAB — PREPARE RBC (CROSSMATCH)

## 2016-10-26 LAB — MAGNESIUM: Magnesium: 1.9 mg/dL (ref 1.7–2.4)

## 2016-10-26 SURGERY — CORONARY ARTERY BYPASS GRAFTING (CABG)
Anesthesia: General | Site: Leg Upper | Laterality: Right

## 2016-10-26 MED ORDER — VANCOMYCIN HCL 1000 MG IV SOLR
INTRAVENOUS | Status: DC | PRN
  Administered 2016-10-26: 1250 mg via INTRAVENOUS

## 2016-10-26 MED ORDER — BISACODYL 10 MG RE SUPP: 10.0000 mg | Freq: Every day | RECTAL | Status: DC

## 2016-10-26 MED ORDER — FAMOTIDINE IN NACL 20-0.9 MG/50ML-% IV SOLN
20.0000 mg | Freq: Two times a day (BID) | INTRAVENOUS | Status: AC
  Administered 2016-10-26 (×2): 20 mg via INTRAVENOUS
  Filled 2016-10-26: qty 50

## 2016-10-26 MED ORDER — INSULIN REGULAR BOLUS VIA INFUSION
0.0000 [IU] | Freq: Three times a day (TID) | INTRAVENOUS | Status: DC
  Filled 2016-10-26: qty 10

## 2016-10-26 MED ORDER — IPRATROPIUM-ALBUTEROL 0.5-2.5 (3) MG/3ML IN SOLN
3.0000 mL | Freq: Four times a day (QID) | RESPIRATORY_TRACT | Status: DC
  Administered 2016-10-26: 3 mL via RESPIRATORY_TRACT

## 2016-10-26 MED ORDER — PROPOFOL 10 MG/ML IV BOLUS
INTRAVENOUS | Status: DC | PRN
  Administered 2016-10-26: 80 mg via INTRAVENOUS

## 2016-10-26 MED ORDER — LACTATED RINGERS IV SOLN: INTRAVENOUS | Status: DC

## 2016-10-26 MED ORDER — ACETAMINOPHEN 160 MG/5ML PO SOLN: 1000.0000 mg | Freq: Four times a day (QID) | ORAL | Status: DC

## 2016-10-26 MED ORDER — ORAL CARE MOUTH RINSE
15.0000 mL | Freq: Four times a day (QID) | OROMUCOSAL | Status: DC
  Administered 2016-10-27 (×3): 15 mL via OROMUCOSAL

## 2016-10-26 MED ORDER — ROSUVASTATIN CALCIUM 10 MG PO TABS
10.0000 mg | ORAL_TABLET | Freq: Every day | ORAL | Status: DC
  Administered 2016-10-27 – 2016-11-03 (×8): 10 mg via ORAL
  Filled 2016-10-26 (×8): qty 1

## 2016-10-26 MED ORDER — LACTATED RINGERS IV SOLN: 500.0000 mL | Freq: Once | INTRAVENOUS | Status: DC | PRN

## 2016-10-26 MED ORDER — DEXMEDETOMIDINE HCL IN NACL 400 MCG/100ML IV SOLN
INTRAVENOUS | Status: DC | PRN
  Administered 2016-10-26: .4 ug/kg/h via INTRAVENOUS

## 2016-10-26 MED ORDER — ACETAMINOPHEN 500 MG PO TABS
1000.0000 mg | ORAL_TABLET | Freq: Four times a day (QID) | ORAL | Status: AC
  Administered 2016-10-27 – 2016-10-31 (×15): 1000 mg via ORAL
  Filled 2016-10-26 (×16): qty 2

## 2016-10-26 MED ORDER — DOCUSATE SODIUM 100 MG PO CAPS
200.0000 mg | ORAL_CAPSULE | Freq: Every day | ORAL | Status: DC
  Administered 2016-10-27 – 2016-11-02 (×5): 200 mg via ORAL
  Filled 2016-10-26 (×5): qty 2

## 2016-10-26 MED ORDER — ACETAMINOPHEN 650 MG RE SUPP
650.0000 mg | Freq: Once | RECTAL | Status: AC
  Administered 2016-10-26: 650 mg via RECTAL

## 2016-10-26 MED ORDER — ASPIRIN 81 MG PO CHEW: 324.0000 mg | CHEWABLE_TABLET | Freq: Every day | ORAL | Status: DC

## 2016-10-26 MED ORDER — PHENYLEPHRINE HCL 10 MG/ML IJ SOLN
INTRAMUSCULAR | Status: DC | PRN
  Administered 2016-10-26: 25 ug/min via INTRAVENOUS

## 2016-10-26 MED ORDER — LACTATED RINGERS IV SOLN
INTRAVENOUS | Status: DC | PRN
  Administered 2016-10-26 (×6): via INTRAVENOUS

## 2016-10-26 MED ORDER — FENTANYL CITRATE (PF) 250 MCG/5ML IJ SOLN
INTRAMUSCULAR | Status: AC
  Filled 2016-10-26: qty 25

## 2016-10-26 MED ORDER — PROTAMINE SULFATE 10 MG/ML IV SOLN
INTRAVENOUS | Status: DC | PRN
  Administered 2016-10-26: 30 mg via INTRAVENOUS
  Administered 2016-10-26 (×2): 100 mg via INTRAVENOUS
  Administered 2016-10-26: 50 mg via INTRAVENOUS

## 2016-10-26 MED ORDER — METOPROLOL TARTRATE 25 MG/10 ML ORAL SUSPENSION
12.5000 mg | Freq: Two times a day (BID) | ORAL | Status: DC
  Administered 2016-10-26: 12.5 mg
  Filled 2016-10-26: qty 5

## 2016-10-26 MED ORDER — FENTANYL CITRATE (PF) 250 MCG/5ML IJ SOLN
INTRAMUSCULAR | Status: DC | PRN
  Administered 2016-10-26 (×2): 100 ug via INTRAVENOUS
  Administered 2016-10-26: 150 ug via INTRAVENOUS
  Administered 2016-10-26 (×6): 100 ug via INTRAVENOUS
  Administered 2016-10-26: 50 ug via INTRAVENOUS
  Administered 2016-10-26: 100 ug via INTRAVENOUS
  Administered 2016-10-26: 150 ug via INTRAVENOUS

## 2016-10-26 MED ORDER — NITROGLYCERIN IN D5W 200-5 MCG/ML-% IV SOLN
INTRAVENOUS | Status: DC | PRN
  Administered 2016-10-26: 16.6 ug/min via INTRAVENOUS

## 2016-10-26 MED ORDER — SODIUM CHLORIDE 0.9% FLUSH: 3.0000 mL | INTRAVENOUS | Status: DC | PRN

## 2016-10-26 MED ORDER — ASPIRIN EC 325 MG PO TBEC
325.0000 mg | DELAYED_RELEASE_TABLET | Freq: Every day | ORAL | Status: DC
  Administered 2016-10-27 – 2016-11-04 (×9): 325 mg via ORAL
  Filled 2016-10-26 (×9): qty 1

## 2016-10-26 MED ORDER — MORPHINE SULFATE (PF) 4 MG/ML IV SOLN
2.0000 mg | INTRAVENOUS | Status: DC | PRN
  Administered 2016-10-27: 2 mg via INTRAVENOUS
  Filled 2016-10-26: qty 1

## 2016-10-26 MED ORDER — SODIUM CHLORIDE 0.9 % IJ SOLN
OROMUCOSAL | Status: DC | PRN
  Administered 2016-10-26: 4 mL via TOPICAL

## 2016-10-26 MED ORDER — 0.9 % SODIUM CHLORIDE (POUR BTL) OPTIME
TOPICAL | Status: DC | PRN
  Administered 2016-10-26: 5000 mL

## 2016-10-26 MED ORDER — LIDOCAINE 2% (20 MG/ML) 5 ML SYRINGE
INTRAMUSCULAR | Status: AC
  Filled 2016-10-26: qty 5

## 2016-10-26 MED ORDER — HEMOSTATIC AGENTS (NO CHARGE) OPTIME
TOPICAL | Status: DC | PRN
  Administered 2016-10-26: 1 via TOPICAL

## 2016-10-26 MED ORDER — HEPARIN SODIUM (PORCINE) 1000 UNIT/ML IJ SOLN
INTRAMUSCULAR | Status: AC
Start: 2016-10-26 — End: 2016-10-26
  Filled 2016-10-26: qty 1

## 2016-10-26 MED ORDER — MIDAZOLAM HCL 2 MG/2ML IJ SOLN: 2.0000 mg | INTRAMUSCULAR | Status: DC | PRN

## 2016-10-26 MED ORDER — BISACODYL 5 MG PO TBEC
10.0000 mg | DELAYED_RELEASE_TABLET | Freq: Every day | ORAL | Status: DC
  Administered 2016-10-27 – 2016-11-01 (×6): 10 mg via ORAL
  Filled 2016-10-26 (×8): qty 2

## 2016-10-26 MED ORDER — SODIUM CHLORIDE 0.9 % IV SOLN
INTRAVENOUS | Status: DC
  Administered 2016-10-26: 0.6 [IU]/h via INTRAVENOUS
  Filled 2016-10-26: qty 1

## 2016-10-26 MED ORDER — PLASMA-LYTE 148 IV SOLN
INTRAVENOUS | Status: DC | PRN
  Administered 2016-10-26: 500 mL

## 2016-10-26 MED ORDER — LACTATED RINGERS IV SOLN
INTRAVENOUS | Status: DC
  Administered 2016-10-26: 09:00:00 via INTRAVENOUS

## 2016-10-26 MED ORDER — DEXTROSE 5 % IV SOLN
INTRAVENOUS | Status: DC | PRN
  Administered 2016-10-26: .75 g via INTRAVENOUS

## 2016-10-26 MED ORDER — ONDANSETRON HCL 4 MG/2ML IJ SOLN
4.0000 mg | Freq: Four times a day (QID) | INTRAMUSCULAR | Status: DC | PRN
Start: 2016-10-26 — End: 2016-11-04
  Administered 2016-10-27: 4 mg via INTRAVENOUS
  Filled 2016-10-26: qty 2

## 2016-10-26 MED ORDER — DEXTROSE 5 % IV SOLN
INTRAVENOUS | Status: DC | PRN
  Administered 2016-10-26: 1.5 g via INTRAVENOUS

## 2016-10-26 MED ORDER — DOPAMINE-DEXTROSE 1.6-5 MG/ML-% IV SOLN
INTRAVENOUS | Status: DC | PRN
  Administered 2016-10-26: 3 ug/kg/min via INTRAVENOUS

## 2016-10-26 MED ORDER — GELATIN ABSORBABLE MT POWD
OROMUCOSAL | Status: DC | PRN
  Administered 2016-10-26: 4 mL via TOPICAL

## 2016-10-26 MED ORDER — SODIUM CHLORIDE 0.45 % IV SOLN
INTRAVENOUS | Status: DC | PRN
  Administered 2016-10-26: 17:00:00 via INTRAVENOUS

## 2016-10-26 MED ORDER — METOPROLOL TARTRATE 5 MG/5ML IV SOLN: 2.5000 mg | INTRAVENOUS | Status: DC | PRN

## 2016-10-26 MED ORDER — POTASSIUM CHLORIDE 10 MEQ/50ML IV SOLN
10.0000 meq | INTRAVENOUS | Status: AC
  Administered 2016-10-26 (×3): 10 meq via INTRAVENOUS

## 2016-10-26 MED ORDER — DEXTROSE 5 % IV SOLN
INTRAVENOUS | Status: DC | PRN
  Administered 2016-10-26: 20 ug/min via INTRAVENOUS
  Administered 2016-10-26: 40 ug/min via INTRAVENOUS

## 2016-10-26 MED ORDER — ROCURONIUM BROMIDE 10 MG/ML (PF) SYRINGE
PREFILLED_SYRINGE | INTRAVENOUS | Status: AC
  Filled 2016-10-26: qty 5

## 2016-10-26 MED ORDER — HEPARIN SODIUM (PORCINE) 1000 UNIT/ML IJ SOLN
INTRAMUSCULAR | Status: DC | PRN
  Administered 2016-10-26: 28000 [IU] via INTRAVENOUS

## 2016-10-26 MED ORDER — MORPHINE SULFATE (PF) 4 MG/ML IV SOLN: 1.0000 mg | INTRAVENOUS | Status: AC | PRN

## 2016-10-26 MED ORDER — PANTOPRAZOLE SODIUM 40 MG PO TBEC
40.0000 mg | DELAYED_RELEASE_TABLET | Freq: Two times a day (BID) | ORAL | Status: DC
  Administered 2016-10-27 – 2016-11-02 (×14): 40 mg via ORAL
  Filled 2016-10-26 (×14): qty 1

## 2016-10-26 MED ORDER — DOPAMINE-DEXTROSE 3.2-5 MG/ML-% IV SOLN: 3.0000 ug/kg/min | INTRAVENOUS | Status: AC

## 2016-10-26 MED ORDER — CHLORHEXIDINE GLUCONATE 0.12% ORAL RINSE (MEDLINE KIT)
15.0000 mL | Freq: Two times a day (BID) | OROMUCOSAL | Status: DC
  Administered 2016-10-26 – 2016-10-27 (×2): 15 mL via OROMUCOSAL

## 2016-10-26 MED ORDER — OXYCODONE HCL 5 MG PO TABS: 5.0000 mg | ORAL_TABLET | ORAL | Status: DC | PRN

## 2016-10-26 MED ORDER — VANCOMYCIN HCL IN DEXTROSE 1-5 GM/200ML-% IV SOLN
1000.0000 mg | Freq: Once | INTRAVENOUS | Status: AC
  Administered 2016-10-26: 1000 mg via INTRAVENOUS
  Filled 2016-10-26: qty 200

## 2016-10-26 MED ORDER — PHENYLEPHRINE HCL 10 MG/ML IJ SOLN
0.0000 ug/min | INTRAMUSCULAR | Status: DC
  Filled 2016-10-26: qty 2

## 2016-10-26 MED ORDER — SODIUM CHLORIDE 0.9 % IV SOLN
0.0000 ug/kg/h | INTRAVENOUS | Status: DC
  Filled 2016-10-26: qty 2

## 2016-10-26 MED ORDER — MIDAZOLAM HCL 10 MG/2ML IJ SOLN
INTRAMUSCULAR | Status: AC
  Filled 2016-10-26: qty 2

## 2016-10-26 MED ORDER — SODIUM CHLORIDE 0.9 % IJ SOLN
INTRAMUSCULAR | Status: AC
  Filled 2016-10-26: qty 20

## 2016-10-26 MED ORDER — ALBUTEROL SULFATE (2.5 MG/3ML) 0.083% IN NEBU: 2.5000 mg | INHALATION_SOLUTION | RESPIRATORY_TRACT | Status: DC | PRN

## 2016-10-26 MED ORDER — MIDAZOLAM HCL 5 MG/5ML IJ SOLN
INTRAMUSCULAR | Status: DC | PRN
  Administered 2016-10-26 (×2): 2 mg via INTRAVENOUS
  Administered 2016-10-26: 6 mg via INTRAVENOUS

## 2016-10-26 MED ORDER — METOPROLOL TARTRATE 12.5 MG HALF TABLET
12.5000 mg | ORAL_TABLET | Freq: Two times a day (BID) | ORAL | Status: DC
  Administered 2016-10-27 – 2016-11-04 (×16): 12.5 mg via ORAL
  Filled 2016-10-26 (×16): qty 1

## 2016-10-26 MED ORDER — ALBUMIN HUMAN 5 % IV SOLN
INTRAVENOUS | Status: DC | PRN
  Administered 2016-10-26: 15:00:00 via INTRAVENOUS

## 2016-10-26 MED ORDER — SODIUM CHLORIDE 0.9 % IV SOLN: 250.0000 mL | INTRAVENOUS | Status: DC

## 2016-10-26 MED ORDER — NITROGLYCERIN IN D5W 200-5 MCG/ML-% IV SOLN
0.0000 ug/min | INTRAVENOUS | Status: DC
  Administered 2016-10-27: 50 ug/min via INTRAVENOUS

## 2016-10-26 MED ORDER — CHLORHEXIDINE GLUCONATE 0.12 % MT SOLN
15.0000 mL | OROMUCOSAL | Status: AC
  Administered 2016-10-26: 15 mL via OROMUCOSAL
  Filled 2016-10-26: qty 15

## 2016-10-26 MED ORDER — ROCURONIUM 10MG/ML (10ML) SYRINGE FOR MEDFUSION PUMP - OPTIME
INTRAVENOUS | Status: DC | PRN
Start: 2016-10-26 — End: 2016-10-26
  Administered 2016-10-26 (×6): 50 mg via INTRAVENOUS

## 2016-10-26 MED ORDER — DEXTROSE 5 % IV SOLN
1.5000 g | Freq: Two times a day (BID) | INTRAVENOUS | Status: AC
  Administered 2016-10-26 – 2016-10-28 (×4): 1.5 g via INTRAVENOUS
  Filled 2016-10-26 (×4): qty 1.5

## 2016-10-26 MED ORDER — SODIUM CHLORIDE 0.9 % IV SOLN
INTRAVENOUS | Status: DC
  Administered 2016-10-27: 17:00:00 via INTRAVENOUS

## 2016-10-26 MED ORDER — ALBUMIN HUMAN 5 % IV SOLN
250.0000 mL | INTRAVENOUS | Status: AC | PRN
  Administered 2016-10-26 (×2): 250 mL via INTRAVENOUS
  Filled 2016-10-26: qty 250

## 2016-10-26 MED ORDER — SODIUM CHLORIDE 0.9 % IV SOLN
INTRAVENOUS | Status: DC | PRN
  Administered 2016-10-26: 3.4 [IU]/h via INTRAVENOUS

## 2016-10-26 MED ORDER — ACETAMINOPHEN 160 MG/5ML PO SOLN: 650.0000 mg | Freq: Once | ORAL | Status: AC

## 2016-10-26 MED ORDER — TRANEXAMIC ACID 1000 MG/10ML IV SOLN
INTRAVENOUS | Status: DC | PRN
  Administered 2016-10-26: 1.5 mg/kg/h via INTRAVENOUS
  Administered 2016-10-26: 1080 mg via INTRAVENOUS

## 2016-10-26 MED ORDER — SODIUM CHLORIDE 0.9% FLUSH
3.0000 mL | Freq: Two times a day (BID) | INTRAVENOUS | Status: DC
  Administered 2016-10-27 – 2016-11-03 (×12): 3 mL via INTRAVENOUS

## 2016-10-26 MED ORDER — TRAMADOL HCL 50 MG PO TABS: 50.0000 mg | ORAL_TABLET | ORAL | Status: DC | PRN

## 2016-10-26 MED FILL — Potassium Chloride Inj 2 mEq/ML: INTRAVENOUS | Qty: 40 | Status: AC

## 2016-10-26 MED FILL — Heparin Sodium (Porcine) Inj 1000 Unit/ML: INTRAMUSCULAR | Qty: 30 | Status: AC

## 2016-10-26 MED FILL — Magnesium Sulfate Inj 50%: INTRAMUSCULAR | Qty: 10 | Status: AC

## 2016-10-26 SURGICAL SUPPLY — 79 items
ADH SKN CLS APL DERMABOND .7 (GAUZE/BANDAGES/DRESSINGS) ×3
BAG DECANTER FOR FLEXI CONT (MISCELLANEOUS) ×4 IMPLANT
BANDAGE ACE 4X5 VEL STRL LF (GAUZE/BANDAGES/DRESSINGS) ×2 IMPLANT
BANDAGE ACE 6X5 VEL STRL LF (GAUZE/BANDAGES/DRESSINGS) ×3 IMPLANT
BANDAGE ELASTIC 4 VELCRO ST LF (GAUZE/BANDAGES/DRESSINGS) ×2 IMPLANT
BANDAGE ELASTIC 6 VELCRO ST LF (GAUZE/BANDAGES/DRESSINGS) ×3 IMPLANT
BLADE STERNUM SYSTEM 6 (BLADE) ×4 IMPLANT
BNDG GAUZE ELAST 4 BULKY (GAUZE/BANDAGES/DRESSINGS) ×4 IMPLANT
CANISTER SUCT 3000ML PPV (MISCELLANEOUS) ×4 IMPLANT
CATH CPB KIT GERHARDT (MISCELLANEOUS) ×4 IMPLANT
CATH THORACIC 28FR (CATHETERS) ×8 IMPLANT
CRADLE DONUT ADULT HEAD (MISCELLANEOUS) ×4 IMPLANT
DERMABOND ADVANCED (GAUZE/BANDAGES/DRESSINGS) ×1
DERMABOND ADVANCED .7 DNX12 (GAUZE/BANDAGES/DRESSINGS) ×3 IMPLANT
DRAIN CHANNEL 28F RND 3/8 FF (WOUND CARE) ×4 IMPLANT
DRAPE CARDIOVASCULAR INCISE (DRAPES) ×4
DRAPE SLUSH/WARMER DISC (DRAPES) ×4 IMPLANT
DRAPE SRG 135X102X78XABS (DRAPES) ×3 IMPLANT
DRSG AQUACEL AG ADV 3.5X14 (GAUZE/BANDAGES/DRESSINGS) ×4 IMPLANT
ELECT BLADE 4.0 EZ CLEAN MEGAD (MISCELLANEOUS) ×4
ELECT REM PT RETURN 9FT ADLT (ELECTROSURGICAL) ×8
ELECTRODE BLDE 4.0 EZ CLN MEGD (MISCELLANEOUS) ×3 IMPLANT
ELECTRODE REM PT RTRN 9FT ADLT (ELECTROSURGICAL) ×6 IMPLANT
FELT TEFLON 1X6 (MISCELLANEOUS) ×6 IMPLANT
GAUZE SPONGE 4X4 12PLY STRL (GAUZE/BANDAGES/DRESSINGS) ×8 IMPLANT
GAUZE SPONGE 4X4 12PLY STRL LF (GAUZE/BANDAGES/DRESSINGS) ×4 IMPLANT
GLOVE BIO SURGEON STRL SZ 6.5 (GLOVE) ×15 IMPLANT
GLOVE BIOGEL PI IND STRL 6 (GLOVE) IMPLANT
GLOVE BIOGEL PI IND STRL 6.5 (GLOVE) ×2 IMPLANT
GLOVE BIOGEL PI INDICATOR 6 (GLOVE) ×1
GLOVE BIOGEL PI INDICATOR 6.5 (GLOVE) ×2
GLOVE SURG SS PI 6.5 STRL IVOR (GLOVE) ×3 IMPLANT
GOWN STRL REUS W/ TWL LRG LVL3 (GOWN DISPOSABLE) ×15 IMPLANT
GOWN STRL REUS W/TWL LRG LVL3 (GOWN DISPOSABLE) ×28
HEMOSTAT POWDER SURGIFOAM 1G (HEMOSTASIS) ×12 IMPLANT
HEMOSTAT SURGICEL 2X14 (HEMOSTASIS) ×4 IMPLANT
KIT BASIN OR (CUSTOM PROCEDURE TRAY) ×4 IMPLANT
KIT CATH SUCT 8FR (CATHETERS) ×4 IMPLANT
KIT ROOM TURNOVER OR (KITS) ×4 IMPLANT
KIT SUCTION CATH 14FR (SUCTIONS) ×8 IMPLANT
KIT VASOVIEW HEMOPRO VH 3000 (KITS) ×4 IMPLANT
LEAD PACING MYOCARDI (MISCELLANEOUS) ×4 IMPLANT
MARKER GRAFT CORONARY BYPASS (MISCELLANEOUS) ×12 IMPLANT
NS IRRIG 1000ML POUR BTL (IV SOLUTION) ×20 IMPLANT
PACK OPEN HEART (CUSTOM PROCEDURE TRAY) ×4 IMPLANT
PAD ARMBOARD 7.5X6 YLW CONV (MISCELLANEOUS) ×8 IMPLANT
PAD ELECT DEFIB RADIOL ZOLL (MISCELLANEOUS) ×4 IMPLANT
PENCIL BUTTON HOLSTER BLD 10FT (ELECTRODE) ×4 IMPLANT
PUNCH AORTIC ROT 4.0MM RCL 40 (MISCELLANEOUS) ×2 IMPLANT
SET CARDIOPLEGIA MPS 5001102 (MISCELLANEOUS) ×3 IMPLANT
SOLUTION ANTI FOG 6CC (MISCELLANEOUS) ×2 IMPLANT
SPONGE LAP 18X18 X RAY DECT (DISPOSABLE) ×2 IMPLANT
SPONGE LAP 4X18 X RAY DECT (DISPOSABLE) ×6 IMPLANT
SUT BONE WAX W31G (SUTURE) ×4 IMPLANT
SUT MNCRL AB 4-0 PS2 18 (SUTURE) ×4 IMPLANT
SUT PROLENE 3 0 SH1 36 (SUTURE) ×4 IMPLANT
SUT PROLENE 4 0 TF (SUTURE) ×8 IMPLANT
SUT PROLENE 6 0 C 1 30 (SUTURE) ×2 IMPLANT
SUT PROLENE 6 0 CC (SUTURE) ×8 IMPLANT
SUT PROLENE 7 0 BV1 MDA (SUTURE) ×6 IMPLANT
SUT PROLENE 7.0 RB 3 (SUTURE) ×12 IMPLANT
SUT PROLENE 8 0 BV175 6 (SUTURE) ×5 IMPLANT
SUT STEEL 6MS V (SUTURE) ×4 IMPLANT
SUT STEEL SZ 6 DBL 3X14 BALL (SUTURE) ×4 IMPLANT
SUT VIC AB 1 CTX 18 (SUTURE) ×8 IMPLANT
SUT VIC AB 2-0 CT1 27 (SUTURE) ×4
SUT VIC AB 2-0 CT1 TAPERPNT 27 (SUTURE) ×3 IMPLANT
SUTURE E-PAK OPEN HEART (SUTURE) ×4 IMPLANT
SYSTEM SAHARA CHEST DRAIN ATS (WOUND CARE) ×7 IMPLANT
TAPE CLOTH SURG 4X10 WHT LF (GAUZE/BANDAGES/DRESSINGS) ×4 IMPLANT
TAPE PAPER 3X10 WHT MICROPORE (GAUZE/BANDAGES/DRESSINGS) ×3 IMPLANT
TOWEL GREEN STERILE (TOWEL DISPOSABLE) ×10 IMPLANT
TOWEL GREEN STERILE FF (TOWEL DISPOSABLE) ×4 IMPLANT
TOWEL OR 17X24 6PK STRL BLUE (TOWEL DISPOSABLE) ×8 IMPLANT
TOWEL OR 17X26 10 PK STRL BLUE (TOWEL DISPOSABLE) ×2 IMPLANT
TRAY FOLEY SILVER 16FR TEMP (SET/KITS/TRAYS/PACK) ×4 IMPLANT
TUBING INSUFFLATION (TUBING) ×4 IMPLANT
UNDERPAD 30X30 (UNDERPADS AND DIAPERS) ×4 IMPLANT
WATER STERILE IRR 1000ML POUR (IV SOLUTION) ×8 IMPLANT

## 2016-10-26 NOTE — Progress Notes (Signed)
Patient has had nosebleeds and coughing up blood (thin consistency and a few clots) for the past hour. Apparently this happened during the morning of 5/29. Has heparin drip going at 7.5 cc/hr. VSS. Patient states he feels that it's draining from his nose down to throat. Has no other distress.  Paged MD Hal Hope- he advised to call the on-call cardiothoracic surgeon to see if heparin drip could be stopped since he is scheduled for CABG today, 5/30.  Paged MD Bartle. Stated to stop heparin drip.  Updated MD Hal Hope about stopping the patient's heparin drip. Will continue to monitor.

## 2016-10-26 NOTE — Transfer of Care (Signed)
Immediate Anesthesia Transfer of Care Note  Patient: Shawn Collins  Procedure(s) Performed: Procedure(s): CORONARY ARTERY BYPASS GRAFTING (CABG) x 4 USING LEFT INTERNAL MAMMARY ARTERY TO LAD AND ENDOSCOPICALLY HARVESTED GREATER SAPHENOUS VEIN TO OM 1, 2 AND TO DIAG (N/A) TRANSESOPHAGEAL ECHOCARDIOGRAM (TEE) (N/A) ENDOVEIN HARVEST OF GREATER SAPHENOUS VEIN (Right)  Patient Location: SICU  Anesthesia Type:General  Level of Consciousness: Patient remains intubated per anesthesia plan  Airway & Oxygen Therapy: Patient remains intubated per anesthesia plan and Patient placed on Ventilator (see vital sign flow sheet for setting)  Post-op Assessment: Report given to RN and Post -op Vital signs reviewed and stable  Post vital signs: Reviewed and stable  Last Vitals:  Vitals:   10/26/16 0744 10/26/16 1655  BP: (!) 168/89   Pulse: 77 90  Resp: (!) 24 12  Temp: 37.1 C     Last Pain:  Vitals:   10/26/16 0744  TempSrc: Oral  PainSc:       Patients Stated Pain Goal: 0 (69/62/95 2841)  Complications: No apparent anesthesia complications

## 2016-10-26 NOTE — Progress Notes (Signed)
      [address]       [place], [address]             [phone]      Pre Procedure note for inpatients:   Ameer Sanden has been scheduled for Procedure(s): CORONARY ARTERY BYPASS GRAFTING (CABG) (N/A) TRANSESOPHAGEAL ECHOCARDIOGRAM (TEE) (N/A) today. The various methods of treatment have been discussed with the patient. After consideration of the risks, benefits and treatment options the patient has consented to the planned procedure.   The patient has been seen and labs reviewed. There are no changes in the patient's condition to prevent proceeding with the planned procedure today.  Recent labs:  Lab Results  Component Value Date   WBC 6.7 10/26/2016   HGB 10.6 (L) 10/26/2016   HCT 31.5 (L) 10/26/2016   PLT 357 10/26/2016   GLUCOSE 265 (H) 10/26/2016   CHOL 158 10/16/2016   TRIG 49 10/16/2016   HDL 48 10/16/2016   LDLCALC 100 (H) 10/16/2016   ALT 19 10/25/2016   AST 23 10/25/2016   NA 131 (L) 10/26/2016   K 4.1 10/26/2016   CL 104 10/26/2016   CREATININE 1.89 (H) 10/26/2016   BUN 39 (H) 10/26/2016   CO2 21 (L) 10/26/2016   INR 1.14 10/25/2016   HGBA1C 10.1 (H) 10/16/2016   MICROALBUR 13.50 (H) 01/22/2008    Grace Isaac, MD 10/26/2016 10:00 AM

## 2016-10-26 NOTE — Progress Notes (Signed)
  Echocardiogram Echocardiogram Transesophageal has been performed.  Bobbye Charleston 10/26/2016, 11:23 AM

## 2016-10-26 NOTE — Anesthesia Procedure Notes (Signed)
Arterial Line Insertion Start/End5/30/2018 10:00 AM, 10/26/2016 10:10 AM Performed by: Teressa Lower, CRNA  Patient location: Pre-op. Preanesthetic checklist: patient identified, IV checked, site marked, risks and benefits discussed, surgical consent, monitors and equipment checked, pre-op evaluation, timeout performed and anesthesia consent Lidocaine 1% used for infiltration Left, radial was placed Catheter size: 20 G Hand hygiene performed , maximum sterile barriers used  and Seldinger technique used Allen's test indicative of satisfactory collateral circulation Attempts: 1 Procedure performed without using ultrasound guided technique. Following insertion, dressing applied. Post procedure assessment: normal and unchanged  Patient tolerated the procedure well with no immediate complications.

## 2016-10-26 NOTE — Progress Notes (Signed)
Pharmacy asked to adjust antibiotics for renal dysfunction.  [employer] policy allows pharmacy to change the ordered dose based on indication without contacting the provider, therefore a consult is not required.  Of note, patient's post-op vancomycin 1g IV x1 and Zinacef 1.5g IV q12h x4 are ordered appropriately.  Pharmacy to sign off as no adjustment needed.    Docia Klar D. Statia Burdick, PharmD, BCPS Clinical Pharmacist 10/26/2016 5:03 PM

## 2016-10-26 NOTE — Anesthesia Procedure Notes (Signed)
Central Venous Catheter Insertion Performed by: Myrtie Soman, anesthesiologist Start/End5/30/2018 10:01 AM, 10/26/2016 10:01 AM Patient location: Pre-op. Preanesthetic checklist: patient identified, IV checked, site marked, risks and benefits discussed, surgical consent, monitors and equipment checked, pre-op evaluation, timeout performed and anesthesia consent Position: Trendelenburg Hand hygiene performed  and maximum sterile barriers used  Catheter size: 8.5 Fr PA cath was placed.Swan type:thermodilution PA Cath depth:40 Procedure performed using ultrasound guided technique. Ultrasound Notes:anatomy identified, needle tip was noted to be adjacent to the nerve/plexus identified, no ultrasound evidence of intravascular and/or intraneural injection and image(s) printed for medical record Attempts: 1 Following insertion, line sutured, dressing applied and Biopatch. Post procedure assessment: blood return through all ports  Patient tolerated the procedure well with no immediate complications.

## 2016-10-26 NOTE — Progress Notes (Signed)
T[facility] BRIEF SICU PROGRESS NOTE  Day of Surgery  S/P Procedure(s) (LRB): CORONARY ARTERY BYPASS GRAFTING (CABG) x 4 USING LEFT INTERNAL MAMMARY ARTERY TO LAD AND ENDOSCOPICALLY HARVESTED GREATER SAPHENOUS VEIN TO OM 1, 2 AND TO DIAG (N/A) TRANSESOPHAGEAL ECHOCARDIOGRAM (TEE) (N/A) ENDOVEIN HARVEST OF GREATER SAPHENOUS VEIN (Right)   Sedated on vent AAI paced w/ stable hemodynamics on low dose dopamine O2 sats 100% Chest tube output low UOP adequate Labs okay  Plan: Continue routine early postop  Rexene Alberts, MD 10/26/2016 6:28 PM

## 2016-10-26 NOTE — Brief Op Note (Addendum)
      [address]       [place] [address]             [phone]     10/26/2016  2:33 PM  PATIENT:  Shawn Collins  66 y.o. male  PRE-OPERATIVE DIAGNOSIS:  CAD  POST-OPERATIVE DIAGNOSIS:  CAD  PROCEDURE:  Procedure(s): CORONARY ARTERY BYPASS GRAFTING (CABG) USING LEFT INTERNAL MAMMARY ARTERY TO LAD AND ENDOSCOPICALLY HARVESTED GREATER SAPHENOUS VEIN TO OM 1, 2 AND TO DIAG (N/A) TRANSESOPHAGEAL ECHOCARDIOGRAM (TEE) (N/A) ENDOVEIN HARVEST OF GREATER SAPHENOUS VEIN (Right)  SURGEON:  Surgeon(s) and Role:    * Grace Isaac, MD - Primary  PHYSICIAN ASSISTANT:  Nicholes Rough, PA-C   ANESTHESIA:   general  EBL:  Total I/O In: -  Out: 350 [Urine:350]  BLOOD ADMINISTERED:2 UNTS PRBC IN PUMP  DRAINS: routine   LOCAL MEDICATIONS USED:  NONE  SPECIMEN:  No Specimen  DISPOSITION OF SPECIMEN:  N/A  COUNTS:  YES  TOURNIQUET:  * No tourniquets in log *  DICTATION: .Dragon Dictation  PLAN OF CARE: Admit to inpatient   PATIENT DISPOSITION:  ICU - intubated and hemodynamically stable.   Delay start of Pharmacological VTE agent (>24hrs) due to surgical blood loss or risk of bleeding: yes

## 2016-10-26 NOTE — Progress Notes (Addendum)
PROGRESS NOTE   Shawn Collins  IRW:[id number]    DOB: 11-27-1950    DOA: 10/15/2016  PCP: Nolene Ebbs, MD   I have briefly reviewed patients previous medical [facility] in [facility].  Brief Narrative:  66 year old male with a PMH of type II DM, HTN, HLD, presented with nausea and vomiting, presumed to be due to acute viral gastroenteritis and then developed NSTEMI. Cardiology consulted, underwent cardiac catheterization which showed high risk LAD stenosis along with multivessel disease, not candidate for PCI due to anatomy, planned for high risk CABG by TCTS on 5/30.   Assessment & Plan:   Principal Problem:   NSTEMI (non-ST elevated myocardial infarction) ([place]) Active Problems:   COPD (chronic obstructive pulmonary disease) (H[facility])   Viral gastroenteritis   Diabetes mellitus without complication ([facility])   Hypertension   Nausea & vomiting   CKD (chronic kidney disease), stage III   Hypertensive emergency   Elevated troponin   Abnormal nuclear stress test   Acute kidney injury superimposed on CKD ([place])   Coronary artery disease involving native coronary artery of native heart with unstable angina pectoris ([place])   1. Suspected acute viral GE: Initial presentation with nausea, vomiting and abdominal pain. Abdominal x-ray and CT abdomen pelvis unremarkable. RUQ ultrasound showed dilated CBD unchanged from prior exam. LFTs nonacute. Seems to have improved or even resolved. This presentation could have also been related to diabetic gastroparesis. 2. NSTEMI/CAD: Cardiology consulted. Cardiac cath showed severe three-vessel CAD. Not candidate for PCI due to anatomy. Continue aspirin, carvedilol and high-dose statin. IV heparin was stopped early morning on 5/30 due to epistaxis, after discussing with TCTS. Planned for high risk CABG on 5/30. No chest pain reported. 2-D echo showed LVEF 60-65 percent. 3. Essential hypertension: Mildly uncontrolled. Continue carvedilol, amlodipine and  hydralazine. 4. Acute on stage III chronic kidney disease: Nephrology consultation appreciated. Underlying chronic kidney disease suspected to be from hypertensive/ischemic nephropathy and recent AKI most likely hemodynamically mediated. Creatinine gradually improving, 1.89 today. Had peaked to 2.14 on 5/27. Lisinopril held. SPEP unremarkable. CT abdomen did not show any obstruction. UA 2: Bland. 5. Anemia, chronic: Stable. Probably due to chronic kidney disease. 6. Secondary hyperparathyroidism: Her nephrology. 7. 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 Amaryl & long-acting home insulin held. Currently on SSI and CBGs ranging in the mid 200s. May have to initiate low-dose Lantus. 8. Fever: Workup did not reveal cause. UA, urine culture, CT abdomen and pelvis, chest x-ray, abdominal ultrasound, ESR, CRP, pro-calcitonin and blood cultures negative. 9. Epistaxis/hemoptysis: Noted early morning of 5/30 while he was on IV heparin drip. This was turned off at 6 AM and reportedly decreasing. Chest x-ray personally reviewed and not revealing for cause. He may be coughing up swallowed blood from nosebleed. IV heparin discontinued. Monitor closely and if recurs, may need further evaluation. 10. ? COPD: When necessary bronchodilator nebulizations. Former smoker. Not on home oxygen.   DVT prophylaxis: Was on IV heparin which was discontinued 5/30 morning. Postop deferred to thoracic surgery. Code Status: Full Family Communication: None at bedside Disposition: To be determined. Post CABG, will be in ICU.   Consultants:  Cardiology Nephrology Cardiothoracic surgery   Procedures:  Cardiac cath  Antimicrobials:  None    Subjective: Seen early this morning while he was still on the floor. Overnight events noted. Bleeding from right nostril and coughing up small amount of blood including clots. Dyspnea at times. No chest pain reported. Witnessed approximately 30 mL of bloodstained  fluid and small clots in tray at bedside.   ROS: No dizziness, lightheadedness reported.  Objective:  Vitals:   10/26/16 0338 10/26/16 0405 10/26/16 0744 10/26/16 0823  BP: (!) 162/82  (!) 168/89   Pulse: 68  77   Resp: 20  (!) 24   Temp: 98.1 F (36.7 C)  98.7 F (37.1 C)   TempSrc: Oral  Oral   SpO2: 92%  92% 92%  Weight:  72.5 kg (159 lb 12.8 oz)    Height:        Examination:  General exam: Pleasant middle-aged male lying comfortably propped up in bed. Respiratory system: Slightly harsh and diminished breath sounds bilaterally with scattered occasional expiratory rhonchi anteriorly but clear posteriorly. Occasional basal crackles. Respiratory effort normal. Cardiovascular system: S1 & S2 heard, RRR. No JVD, murmurs, rubs, gallops or clicks. No pedal edema. Telemetry: Sinus rhythm. Gastrointestinal system: Abdomen is nondistended, soft and nontender. No organomegaly or masses felt. Normal bowel sounds heard. Central nervous system: Alert and oriented. No focal neurological deficits. Extremities: Symmetric 5 x 5 power. Skin: No rashes, lesions or ulcers Psychiatry: Judgement and insight appear normal. Mood & affect appropriate.  ENT: Has nasal cannula oxygen on. Mild fresh blood at right anterior nares but not trickling down. Throat exam unremarkable and no blood seen.    Data Reviewed: I have personally reviewed following labs and imaging studies  CBC:  Recent Labs Lab 10/21/16 0323 10/22/16 0243 10/23/16 0548 10/24/16 0430 10/26/16 0454  WBC 9.8 7.7 7.1 6.0 6.7  HGB 11.5* 10.2* 10.5* 10.9* 10.6*  HCT 34.2* 30.6* 31.7* 31.9* 31.5*  MCV 86.8 86.9 86.1 84.6 86.8  PLT 253 249 267 321 888   Basic Metabolic Panel:  Recent Labs Lab 10/20/16 0340 10/21/16 0323 10/22/16 0243 10/23/16 0549 10/24/16 0430 10/25/16 0851 10/26/16 0454  NA 135 132* 132* 131* 130* 131* 131*  K 3.8 3.6 3.7 3.7 3.9 4.3 4.1  CL 105 104 105 105 103 102 104  CO2 19* 20* 19* 17* 18* 19*  21*  GLUCOSE 172* 172* 193* 197* 208* 181* 265*  BUN 48* 52* 54* 53* 53* 43* 39*  CREATININE 1.93* 2.05* 2.07* 2.14* 2.10* 1.99* 1.89*  CALCIUM 8.3* 8.0* 7.9* 8.0* 8.4* 8.4* 8.5*  MG 2.2  --   --   --   --   --   --   PHOS 3.6 2.4* 2.5 2.9 2.7  --   --    Liver Function Tests:  Recent Labs Lab 10/21/16 0323 10/22/16 0243 10/23/16 0549 10/24/16 0430 10/25/16 0851  AST  --   --   --   --  23  ALT  --   --   --   --  19  ALKPHOS  --   --   --   --  42  BILITOT  --   --   --   --  0.5  PROT  --   --   --   --  5.8*  ALBUMIN 2.5* 2.3* 2.3* 2.6* 2.6*   Coagulation Profile:  Recent Labs Lab 10/25/16 0851  INR 1.14   CBG:  Recent Labs Lab 10/25/16 0727 10/25/16 1236 10/25/16 1626 10/25/16 2101 10/26/16 0742  GLUCAP 128* 221* 283* 246* 268*    Recent Results (from the past 240 hour(s))  Culture, Urine     Status: None   Collection Time: 10/19/16  2:05 PM  Result Value Ref Range Status   Specimen Description URINE, CLEAN CATCH  Final  Special Requests NONE  Final   Culture NO GROWTH  Final   Report Status 10/20/2016 FINAL  Final  Culture, blood (routine x 2)     Status: None   Collection Time: 10/19/16  2:22 PM  Result Value Ref Range Status   Specimen Description BLOOD LEFT HAND  Final   Special Requests IN PEDIATRIC BOTTLE Blood Culture adequate volume  Final   Culture NO GROWTH 5 DAYS  Final   Report Status 10/24/2016 FINAL  Final  Culture, blood (routine x 2)     Status: None   Collection Time: 10/19/16  2:27 PM  Result Value Ref Range Status   Specimen Description BLOOD RIGHT ARM  Final   Special Requests IN PEDIATRIC BOTTLE Blood Culture adequate volume  Final   Culture NO GROWTH 5 DAYS  Final   Report Status 10/24/2016 FINAL  Final  Surgical pcr screen     Status: None   Collection Time: 10/25/16 11:43 PM  Result Value Ref Range Status   MRSA, PCR NEGATIVE NEGATIVE Final   Staphylococcus aureus NEGATIVE NEGATIVE Final    Comment:        The Xpert  SA Assay (FDA approved for NASAL specimens in patients over 56 years of age), is one component of a comprehensive surveillance program.  Test performance has been validated by [facility] for patients greater than or equal to 75 year old. It is not intended to diagnose infection nor to guide or monitor treatment.          Radiology Studies: Dg Chest 2 View  Result Date: 10/25/2016 CLINICAL DATA:  Preoperative chest radiograph. Coronary artery disease. Initial encounter. EXAM: CHEST  2 VIEW COMPARISON:  Chest radiograph performed 10/16/2016 FINDINGS: Small bilateral pleural effusions are noted, left larger than right, with bibasilar airspace opacities. This may reflect mild interstitial edema. No pneumothorax is seen. The heart is mildly enlarged. No acute osseous abnormalities are identified. IMPRESSION: Small bilateral pleural effusions, left larger than right, with bibasilar airspace opacities. This may reflect mild interstitial edema. Mild cardiomegaly noted. Electronically Signed   By: Garald Balding M.D.   On: 10/25/2016 22:45   Dg Chest Port 1 View  Result Date: 10/26/2016 CLINICAL DATA:  Acute onset of hemoptysis.  Initial encounter. EXAM: PORTABLE CHEST 1 VIEW COMPARISON:  Chest radiograph performed 10/25/2016 FINDINGS: Small bilateral pleural effusions are noted. Bibasilar airspace opacities may reflect pulmonary edema or pneumonia. No coarse opacities are seen to suggest alveolar hemorrhage. No pneumothorax is seen. The cardiomediastinal silhouette is normal in size. No acute osseous abnormalities are identified. IMPRESSION: Small bilateral pleural effusions. Bibasilar airspace opacities may reflect pulmonary edema or pneumonia. Electronically Signed   By: Garald Balding M.D.   On: 10/26/2016 05:53        Scheduled Meds: . [MAR Hold] amLODipine  5 mg Oral Daily  . [MAR Hold] aspirin  81 mg Oral Daily  . [MAR Hold] carvedilol  6.25 mg Oral BID WC  . [MAR Hold] hydrALAZINE   25 mg Oral Q8H  . [MAR Hold] insulin aspart  0-5 Units Subcutaneous QHS  . [MAR Hold] insulin aspart  0-9 Units Subcutaneous TID WC  . [MAR Hold] ipratropium-albuterol  3 mL Nebulization Q6H  . [MAR Hold] pantoprazole  40 mg Oral BID  . [MAR Hold] polyethylene glycol  17 g Oral Daily  . [MAR Hold] rosuvastatin  10 mg Oral q1800  . [MAR Hold] senna-docusate  1 tablet Oral BID  . [MAR Hold] sodium chloride  flush  3 mL Intravenous Q12H   Continuous Infusions: . [MAR Hold] sodium chloride 250 mL (10/25/16 2000)  . [MAR Hold] ferumoxytol Stopped (10/25/16 1620)  . heparin Stopped (10/26/16 0606)  . lactated ringers 10 mL/hr at 10/26/16 0929     LOS: 22 days     HONGALGI,ANAND, MD, FACP, FHM. [facility] Pager [phone]  If 7PM-7AM, please contact night-coverage [email or web address] Password [email or web address] 10/26/2016, 11:09 AM

## 2016-10-26 NOTE — Anesthesia Procedure Notes (Signed)
Procedure Name: Intubation Date/Time: 10/26/2016 10:43 AM Performed by: Valda Favia Pre-anesthesia Checklist: Patient identified, Emergency Drugs available, Suction available, Patient being monitored and Timeout performed Patient Re-evaluated:Patient Re-evaluated prior to inductionOxygen Delivery Method: Circle system utilized Preoxygenation: Pre-oxygenation with 100% oxygen Intubation Type: IV induction Ventilation: Mask ventilation without difficulty Laryngoscope Size: Mac and 4 Grade View: Grade I Tube type: Oral Tube size: 8.0 mm Number of attempts: 1 Airway Equipment and Method: Stylet Placement Confirmation: ETT inserted through vocal cords under direct vision,  positive ETCO2 and breath sounds checked- equal and bilateral Secured at: 22 cm Tube secured with: Tape Dental Injury: Teeth and Oropharynx as per pre-operative assessment

## 2016-10-26 NOTE — Anesthesia Preprocedure Evaluation (Signed)
Anesthesia Evaluation  Patient identified by MRN, date of birth, ID band Patient awake    Reviewed: Allergy & Precautions, NPO status , Patient's Chart, lab work & pertinent test results  Airway Mallampati: II  TM Distance: >3 FB Neck ROM: Full    Dental no notable dental hx.    Pulmonary COPD, former smoker,    Pulmonary exam normal breath sounds clear to auscultation       Cardiovascular hypertension, + CAD and + Past MI  Normal cardiovascular exam Rhythm:Regular Rate:Normal  Left ventricle: The cavity size was normal. Wall thickness was   normal. Systolic function was normal. The estimated ejection   fraction was in the range of 60% to 65%. Wall motion was normal;   there were no regional wall motion abnormalities. Doppler   parameters are consistent with abnormal left ventricular   relaxation (grade 1 diastolic dysfunction). - Mitral valve: There was mild regurgitation. - Pericardium, extracardiac: A trivial pericardial effusion was   identified.   Neuro/Psych negative neurological ROS  negative psych ROS   GI/Hepatic negative GI ROS, Neg liver ROS,   Endo/Other  diabetes  Renal/GU Renal InsufficiencyRenal disease  negative genitourinary   Musculoskeletal negative musculoskeletal ROS (+)   Abdominal   Peds negative pediatric ROS (+)  Hematology  (+) anemia ,   Anesthesia Other Findings   Reproductive/Obstetrics negative OB ROS                             Anesthesia Physical Anesthesia Plan  ASA: IV  Anesthesia Plan: General   Post-op Pain Management:    Induction: Intravenous  Airway Management Planned: Oral ETT  Additional Equipment: Arterial line, CVP, PA Cath and Ultrasound Guidance Line Placement  Intra-op Plan:   Post-operative Plan: Post-operative intubation/ventilation  Informed Consent: I have reviewed the patients History and Physical, chart, labs and  discussed the procedure including the risks, benefits and alternatives for the proposed anesthesia with the patient or authorized representative who has indicated his/her understanding and acceptance.   Dental advisory given  Plan Discussed with: CRNA and Surgeon  Anesthesia Plan Comments:         Anesthesia Quick Evaluation

## 2016-10-26 NOTE — Plan of Care (Signed)
Problem: Physical Regulation: Goal: Ability to maintain clinical measurements within normal limits will improve Outcome: Progressing Off pressors; VSS

## 2016-10-26 NOTE — Anesthesia Procedure Notes (Signed)
Anesthesia Procedure Image    

## 2016-10-27 ENCOUNTER — Encounter (HOSPITAL_COMMUNITY): Payer: Self-pay | Admitting: Cardiothoracic Surgery

## 2016-10-27 ENCOUNTER — Inpatient Hospital Stay (HOSPITAL_COMMUNITY): Payer: Medicare Other

## 2016-10-27 LAB — MAGNESIUM
MAGNESIUM: 1.9 mg/dL (ref 1.7–2.4)
MAGNESIUM: 2 mg/dL (ref 1.7–2.4)

## 2016-10-27 LAB — POCT I-STAT, CHEM 8
BUN: 26 mg/dL — AB (ref 6–20)
CALCIUM ION: 1.21 mmol/L (ref 1.15–1.40)
CHLORIDE: 105 mmol/L (ref 101–111)
CREATININE: 1.7 mg/dL — AB (ref 0.61–1.24)
GLUCOSE: 112 mg/dL — AB (ref 65–99)
HCT: 29 % — ABNORMAL LOW (ref 39.0–52.0)
Hemoglobin: 9.9 g/dL — ABNORMAL LOW (ref 13.0–17.0)
POTASSIUM: 5 mmol/L (ref 3.5–5.1)
Sodium: 136 mmol/L (ref 135–145)
TCO2: 21 mmol/L (ref 0–100)

## 2016-10-27 LAB — CBC
HCT: 29.2 % — ABNORMAL LOW (ref 39.0–52.0)
HCT: 30.6 % — ABNORMAL LOW (ref 39.0–52.0)
HEMOGLOBIN: 10.8 g/dL — AB (ref 13.0–17.0)
Hemoglobin: 10 g/dL — ABNORMAL LOW (ref 13.0–17.0)
MCH: 29.1 pg (ref 26.0–34.0)
MCH: 30.3 pg (ref 26.0–34.0)
MCHC: 34.2 g/dL (ref 30.0–36.0)
MCHC: 35.3 g/dL (ref 30.0–36.0)
MCV: 84.9 fL (ref 78.0–100.0)
MCV: 86 fL (ref 78.0–100.0)
PLATELETS: 162 10*3/uL (ref 150–400)
Platelets: 174 10*3/uL (ref 150–400)
RBC: 3.44 MIL/uL — ABNORMAL LOW (ref 4.22–5.81)
RBC: 3.56 MIL/uL — AB (ref 4.22–5.81)
RDW: 12.8 % (ref 11.5–15.5)
RDW: 13.5 % (ref 11.5–15.5)
WBC: 14.9 10*3/uL — ABNORMAL HIGH (ref 4.0–10.5)
WBC: 17.6 10*3/uL — ABNORMAL HIGH (ref 4.0–10.5)

## 2016-10-27 LAB — GLUCOSE, CAPILLARY
GLUCOSE-CAPILLARY: 117 mg/dL — AB (ref 65–99)
GLUCOSE-CAPILLARY: 116 mg/dL — AB (ref 65–99)
GLUCOSE-CAPILLARY: 117 mg/dL — AB (ref 65–99)
GLUCOSE-CAPILLARY: 127 mg/dL — AB (ref 65–99)
Glucose-Capillary: 102 mg/dL — ABNORMAL HIGH (ref 65–99)
Glucose-Capillary: 106 mg/dL — ABNORMAL HIGH (ref 65–99)
Glucose-Capillary: 110 mg/dL — ABNORMAL HIGH (ref 65–99)
Glucose-Capillary: 110 mg/dL — ABNORMAL HIGH (ref 65–99)
Glucose-Capillary: 120 mg/dL — ABNORMAL HIGH (ref 65–99)
Glucose-Capillary: 120 mg/dL — ABNORMAL HIGH (ref 65–99)
Glucose-Capillary: 124 mg/dL — ABNORMAL HIGH (ref 65–99)
Glucose-Capillary: 125 mg/dL — ABNORMAL HIGH (ref 65–99)

## 2016-10-27 LAB — BASIC METABOLIC PANEL
Anion gap: 5 (ref 5–15)
BUN: 25 mg/dL — AB (ref 6–20)
CO2: 21 mmol/L — ABNORMAL LOW (ref 22–32)
CREATININE: 1.46 mg/dL — AB (ref 0.61–1.24)
Calcium: 7.8 mg/dL — ABNORMAL LOW (ref 8.9–10.3)
Chloride: 109 mmol/L (ref 101–111)
GFR calc Af Amer: 56 mL/min — ABNORMAL LOW (ref >60)
GFR calc non Af Amer: 48 mL/min — ABNORMAL LOW (ref >60)
Glucose, Bld: 115 mg/dL — ABNORMAL HIGH (ref 65–99)
Potassium: 4.9 mmol/L (ref 3.5–5.1)
SODIUM: 135 mmol/L (ref 135–145)

## 2016-10-27 LAB — POCT I-STAT 3, ART BLOOD GAS (G3+)
Acid-base deficit: 3 mmol/L — ABNORMAL HIGH (ref 0.0–2.0)
BICARBONATE: 21.8 mmol/L (ref 20.0–28.0)
O2 Saturation: 98 %
PCO2 ART: 36.8 mmHg (ref 32.0–48.0)
Patient temperature: 97.5
TCO2: 23 mmol/L (ref 0–100)
pH, Arterial: 7.377 (ref 7.350–7.450)
pO2, Arterial: 109 mmHg — ABNORMAL HIGH (ref 83.0–108.0)

## 2016-10-27 LAB — CREATININE, SERUM
CREATININE: 1.7 mg/dL — AB (ref 0.61–1.24)
GFR calc Af Amer: 46 mL/min — ABNORMAL LOW (ref >60)
GFR calc non Af Amer: 40 mL/min — ABNORMAL LOW (ref >60)

## 2016-10-27 MED ORDER — ENOXAPARIN SODIUM 30 MG/0.3ML ~~LOC~~ SOLN
30.0000 mg | Freq: Every day | SUBCUTANEOUS | Status: DC
  Administered 2016-10-27 – 2016-11-03 (×8): 30 mg via SUBCUTANEOUS
  Filled 2016-10-27 (×8): qty 0.3

## 2016-10-27 MED ORDER — ALBUMIN HUMAN 25 % IV SOLN
12.5000 g | Freq: Once | INTRAVENOUS | Status: AC
  Administered 2016-10-27: 12.5 g via INTRAVENOUS
  Filled 2016-10-27: qty 50

## 2016-10-27 MED ORDER — OXYCODONE HCL 5 MG PO TABS
5.0000 mg | ORAL_TABLET | ORAL | Status: DC | PRN
  Administered 2016-10-27 – 2016-11-03 (×9): 5 mg via ORAL
  Filled 2016-10-27 (×9): qty 1

## 2016-10-27 MED ORDER — FUROSEMIDE 10 MG/ML IJ SOLN
40.0000 mg | Freq: Once | INTRAMUSCULAR | Status: AC
  Administered 2016-10-27: 40 mg via INTRAVENOUS
  Filled 2016-10-27: qty 4

## 2016-10-27 MED ORDER — INSULIN ASPART 100 UNIT/ML ~~LOC~~ SOLN
0.0000 [IU] | SUBCUTANEOUS | Status: DC
Start: 2016-10-27 — End: 2016-10-29
  Administered 2016-10-27 – 2016-10-28 (×2): 2 [IU] via SUBCUTANEOUS

## 2016-10-27 MED ORDER — INSULIN DETEMIR 100 UNIT/ML ~~LOC~~ SOLN
15.0000 [IU] | Freq: Every day | SUBCUTANEOUS | Status: DC
  Administered 2016-10-27 – 2016-10-28 (×2): 15 [IU] via SUBCUTANEOUS
  Filled 2016-10-27 (×3): qty 0.15

## 2016-10-27 MED ORDER — INSULIN DETEMIR 100 UNIT/ML ~~LOC~~ SOLN: 15.0000 [IU] | Freq: Every day | SUBCUTANEOUS | Status: DC

## 2016-10-27 MED ORDER — TRAMADOL HCL 50 MG PO TABS: 50.0000 mg | ORAL_TABLET | ORAL | Status: DC | PRN

## 2016-10-27 MED ORDER — DOPAMINE-DEXTROSE 3.2-5 MG/ML-% IV SOLN
2.0000 ug/kg/min | INTRAVENOUS | Status: DC
  Administered 2016-10-27: 2 ug/kg/min via INTRAVENOUS
  Filled 2016-10-27: qty 250

## 2016-10-27 MED ORDER — AMLODIPINE BESYLATE 10 MG PO TABS
10.0000 mg | ORAL_TABLET | Freq: Every day | ORAL | Status: DC
  Administered 2016-10-27 – 2016-11-04 (×9): 10 mg via ORAL
  Filled 2016-10-27 (×9): qty 1

## 2016-10-27 MED FILL — Sodium Bicarbonate IV Soln 8.4%: INTRAVENOUS | Qty: 50 | Status: AC

## 2016-10-27 MED FILL — Lidocaine HCl IV Inj 20 MG/ML: INTRAVENOUS | Qty: 5 | Status: AC

## 2016-10-27 MED FILL — Electrolyte-R (PH 7.4) Solution: INTRAVENOUS | Qty: 3000 | Status: AC

## 2016-10-27 MED FILL — Heparin Sodium (Porcine) Inj 1000 Unit/ML: INTRAMUSCULAR | Qty: 10 | Status: AC

## 2016-10-27 NOTE — Progress Notes (Signed)
NIF -22 VC 1.2. Pt extubated to a 4lt . SATS 100%. IS [id number] with good effort.

## 2016-10-27 NOTE — Progress Notes (Signed)
Patient ID: Shawn Collins, male   DOB: 1950-11-07, 66 y.o.   MRN: [id number]  [facility] Progress Note   Assessment/ Plan:   1. 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 Renal function improved overnight-possibly dilutional from large volumes of intravenous fluids administered-we'll continue to follow this as his urine output remains vibrant. Suspect drainage of pleural effusions will help further with cardiac output/renal perfusion. 2. Coronary artery disease/NSTEMI now status post CABG: Yesterday underwent four-vessel CABG using LIMA to LAD, greater saphenous to OM1, OM 2 and diagonal. Extubated this morning and has bilateral chest tubes in place because of intraoperative findings of large volumes of pleural effusions. 3. Hypertension: Blood pressures slightly elevated on dopamine drip-plans noted for weaning off. 4. Anemia: Likely anemia of chronic disease including chronic kidney disease-low iron saturation noted, s/p intravenous iron. 5. Secondary hyperparathyroidism: PTH level within acceptable range for chronic kidney disease stage IV, calcium and phosphorus within goal.  Subjective:   Underwent CABG yesterday-now in cardiac ICU. Complains of some chest pain as expected postoperatively    Objective:   BP 114/71   Pulse 80   Temp 97.7 F (36.5 C)   Resp 12   Ht '5\' 10"'  (1.778 m)   Wt 74.7 kg (164 lb 9.6 oz)   SpO2 99%   BMI 23.62 kg/m   Intake/Output Summary (Last 24 hours) at 10/27/16 5462 Last data filed at 10/27/16 0741  Gross per 24 hour  Intake          5356.44 ml  Output             2525 ml  Net          2831.44 ml   Weight change: 0.015 kg (0.5 oz)  Physical Exam: [id number] to be resting comfortably CVS: Pulse regular rhythm, normal rate, S1 and S2 normal Resp: Anteriorly clear to auscultation Abd: Soft, flat,  nontender Ext: Trace ankle edema  Imaging: Dg Chest 2 View  Result Date: 10/25/2016 CLINICAL DATA:  Preoperative chest radiograph. Coronary artery disease. Initial encounter. EXAM: CHEST  2 VIEW COMPARISON:  Chest radiograph performed 10/16/2016 FINDINGS: Small bilateral pleural effusions are noted, left larger than right, with bibasilar airspace opacities. This may reflect mild interstitial edema. No pneumothorax is seen. The heart is mildly enlarged. No acute osseous abnormalities are identified. IMPRESSION: Small bilateral pleural effusions, left larger than right, with bibasilar airspace opacities. This may reflect mild interstitial edema. Mild cardiomegaly noted. Electronically Signed   By: Garald Balding M.D.   On: 10/25/2016 22:45   Dg Chest Port 1 View  Result Date: 10/27/2016 CLINICAL DATA:  Status post CABG yesterday, chest soreness. EXAM: PORTABLE CHEST 1 VIEW COMPARISON:  Portable chest x-ray of Oct 26, 2016 FINDINGS: The lungs are well-expanded. The retrocardiac density remains dense and the left hemidiaphragm obscured. There is no pneumothorax or pneumomediastinum. The heart is normal in size. The pulmonary vascularity is less prominent today. Bilateral chest tubes are stable in position. The mediastinal drain is unchanged. The Swan-Ganz catheter tip projects in the main pulmonary outflow tract. The endotracheal tube tip lies 3.8 cm above the carina. The esophagogastric tube tip and proximal port project below the inferior margin of the image. IMPRESSION: Mild interval improvement in pulmonary interstitial edema. Persistent bibasilar subsegmental atelectasis greatest on the left. No pneumothorax. The support tubes and lines are in reasonable position. Electronically Signed   By: David  Martinique  M.D.   On: 10/27/2016 08:22   Dg Chest Port 1 View  Result Date: 10/26/2016 CLINICAL DATA:  CABG. EXAM: PORTABLE CHEST 1 VIEW COMPARISON:  10/26/2016.  10/25/2016. FINDINGS: Endotracheal tube noted  with its tip 3.8 cm above the carina. NG tube not this tip below left hemidiaphragm. Swan-Ganz catheter with its tip in the pulmonary outflow tract. Bilateral chest tubes and mediastinal drainage catheter noted in good anatomic position. Small right sided pneumothorax. Prior CABG. Persistent but improved bilateral pulmonary infiltrates/edema. Small left pleural effusion. Right chest wall subcutaneous emphysema. IMPRESSION: 1. Lines and tubes including bilateral chest tubes position as above. Small right pneumothorax. 2. Prior CABG. Persistent but improved bilateral pulmonary infiltrates/edema. Small left pleural effusion. Critical Value/emergent results were called by telephone at the time of interpretation on 10/26/2016 at 5:32 pm to [place] , who verbally acknowledged these results. Electronically Signed   By: Marcello Moores  Register   On: 10/26/2016 17:36   Dg Chest Port 1 View  Result Date: 10/26/2016 CLINICAL DATA:  Acute onset of hemoptysis.  Initial encounter. EXAM: PORTABLE CHEST 1 VIEW COMPARISON:  Chest radiograph performed 10/25/2016 FINDINGS: Small bilateral pleural effusions are noted. Bibasilar airspace opacities may reflect pulmonary edema or pneumonia. No coarse opacities are seen to suggest alveolar hemorrhage. No pneumothorax is seen. The cardiomediastinal silhouette is normal in size. No acute osseous abnormalities are identified. IMPRESSION: Small bilateral pleural effusions. Bibasilar airspace opacities may reflect pulmonary edema or pneumonia. Electronically Signed   By: Garald Balding M.D.   On: 10/26/2016 05:53    Labs: [employer]  Recent Labs Lab 10/21/16 0323 10/22/16 0243 10/23/16 0549 10/24/16 0430 10/25/16 0851 10/26/16 0454 10/26/16 1104 10/26/16 1303 10/26/16 1424 10/26/16 1528 10/26/16 1702 10/26/16 2245 10/26/16 2247 10/27/16 0329  NA 132* 132* 131* 130* 131* 131* 135 135 135 132* 139  --  138 135  K 3.6 3.7 3.7 3.9 4.3 4.1 4.3 4.2 4.6 3.9 3.6  --  4.7 4.9  CL 104  105 105 103 102 104 102 104 102 103  --   --  101 109  CO2 20* 19* 17* 18* 19* 21*  --   --   --   --   --   --   --  21*  GLUCOSE 172* 193* 197* 208* 181* 265* 239* 188* 170* 171* 115*  --  141* 115*  BUN 52* 54* 53* 53* 43* 39* 31* 28* 29* 27*  --   --  29* 25*  CREATININE 2.05* 2.07* 2.14* 2.10* 1.99* 1.89* 1.50* 1.30* 1.20 1.40*  --  1.45* 1.40* 1.46*  CALCIUM 8.0* 7.9* 8.0* 8.4* 8.4* 8.5*  --   --   --   --   --   --   --  7.8*  PHOS 2.4* 2.5 2.9 2.7  --   --   --   --   --   --   --   --   --   --    CBC  Recent Labs Lab 10/26/16 0454  10/26/16 1424  10/26/16 1657 10/26/16 1702 10/26/16 2245 10/26/16 2247 10/27/16 0329  WBC 6.7  --   --   --  10.8*  --  15.0*  --  14.9*  HGB 10.6*  < > 8.8*  9.2*  < > 11.2* 10.2* 9.9* 9.9* 10.0*  HCT 31.5*  < > 26.0*  26.6*  < > 32.4* 30.0* 28.6* 29.0* 29.2*  MCV 86.8  --   --   --  84.8  --  84.6  --  84.9  PLT 357  --  203  --  147*  --  148*  --  162  < > = values in this interval not displayed. Medications:    . acetaminophen  1,000 mg Oral Q6H   Or  . acetaminophen (TYLENOL) oral liquid 160 mg/5 mL  1,000 mg Per Tube Q6H  . aspirin EC  325 mg Oral Daily   Or  . aspirin  324 mg Per Tube Daily  . bisacodyl  10 mg Oral Daily   Or  . bisacodyl  10 mg Rectal Daily  . chlorhexidine gluconate (MEDLINE KIT)  15 mL Mouth Rinse BID  . docusate sodium  200 mg Oral Daily  . insulin regular  0-10 Units Intravenous TID WC  . mouth rinse  15 mL Mouth Rinse QID  . metoprolol tartrate  12.5 mg Oral BID   Or  . metoprolol tartrate  12.5 mg Per Tube BID  . pantoprazole  40 mg Oral BID  . rosuvastatin  10 mg Oral q1800  . sodium chloride flush  3 mL Intravenous Q12H   Elmarie Shiley, MD 10/27/2016, 8:42 AM

## 2016-10-27 NOTE — Anesthesia Postprocedure Evaluation (Signed)
Anesthesia Post Note  Patient: Domonique Brouillard  Procedure(s) Performed: Procedure(s) (LRB): CORONARY ARTERY BYPASS GRAFTING (CABG) x 4 USING LEFT INTERNAL MAMMARY ARTERY TO LAD AND ENDOSCOPICALLY HARVESTED GREATER SAPHENOUS VEIN TO OM 1, 2 AND TO DIAG (N/A) TRANSESOPHAGEAL ECHOCARDIOGRAM (TEE) (N/A) ENDOVEIN HARVEST OF GREATER SAPHENOUS VEIN (Right)  Patient location during evaluation: SICU Anesthesia Type: General Level of consciousness: sedated Pain management: pain level controlled Vital Signs Assessment: post-procedure vital signs reviewed and stable Respiratory status: patient remains intubated per anesthesia plan Cardiovascular status: stable Anesthetic complications: no       Last Vitals:  Vitals:   10/27/16 0600 10/27/16 0700  BP: 123/77 114/71  Pulse: 80 80  Resp: 17 12  Temp: 36.5 C 36.5 C    Last Pain:  Vitals:   10/27/16 0400  TempSrc: Core (Comment)  PainSc:                  Edia Pursifull S

## 2016-10-27 NOTE — Progress Notes (Addendum)
Hospitalist Signoff note  Patient underwent CABG on 10/26/16 and was transferred to the ICU under [place]. Chart briefly reviewed and I stopped by this morning to see patient. I discussed with his RN at bedside. He had just been extubated and denied complaints. He did not appear in any distress. Vital signs were stable. Telemetry showed sinus rhythm. He has multiple lines and tubes from recent surgical procedure.  I discussed with Dr. Servando Snare, TCTS who has taken over complete care of this patient onto his service. TRH will sign off at this time. Please consult [place] again for any further assistance.  Vernell Leep, MD, FACP, FHM. [facility] Pager [phone]  If 7PM-7AM, please contact night-coverage [email or web address] Password [email or web address] 10/27/2016, 10:51 AM

## 2016-10-27 NOTE — Progress Notes (Signed)
Patient ID: Shawn Collins, male   DOB: [birth year], 66 y.o.   MRN: [id number] [facility] DAILY ICU PROGRESS NOTE                   [address]            [place] [address]          [phone]   1 Day Post-Op Procedure(s) (LRB): CORONARY ARTERY BYPASS GRAFTING (CABG) x 4 USING LEFT INTERNAL MAMMARY ARTERY TO LAD AND ENDOSCOPICALLY HARVESTED GREATER SAPHENOUS VEIN TO OM 1, 2 AND TO DIAG (N/A) TRANSESOPHAGEAL ECHOCARDIOGRAM (TEE) (N/A) ENDOVEIN HARVEST OF GREATER SAPHENOUS VEIN (Right)  Total Length of Stay:  LOS: 11 days   Subjective: Extubated this am, awake and neuro intact  Objective: Vital signs in last 24 hours: Temp:  [95.7 F (35.4 C)-98.1 F (36.7 C)] 97.7 F (36.5 C) (05/31 0700) Pulse Rate:  [70-90] 80 (05/31 0700) Cardiac Rhythm: Atrial paced (05/31 0730) Resp:  [10-18] 12 (05/31 0700) BP: (95-127)/(65-83) 114/71 (05/31 0700) SpO2:  [99 %-100 %] 99 % (05/31 0700) Arterial Line BP: (111-146)/(60-71) 130/66 (05/31 0700) FiO2 (%):  [40 %-50 %] 40 % (05/31 0516) Weight:  [159 lb 13.3 oz (72.5 kg)-164 lb 9.6 oz (74.7 kg)] 164 lb 9.6 oz (74.7 kg) (05/31 0500)  Filed Weights   10/26/16 0405 10/26/16 1700 10/27/16 0500  Weight: 159 lb 12.8 oz (72.5 kg) 159 lb 13.3 oz (72.5 kg) 164 lb 9.6 oz (74.7 kg)    Weight change: 0.5 oz (0.015 kg)   Hemodynamic parameters for last 24 hours: PAP: (19-32)/(6-21) 32/18 CO:  [3.2 L/min-4.5 L/min] 3.8 L/min CI:  [1.7 L/min/m2-2.4 L/min/m2] 2 L/min/m2  Intake/Output from previous day: 05/30 0701 - 05/31 0700 In: 5356.4 [I.V.:3736.4; Blood:550; NG/GT:120; IV Piggyback:950] Out: 3903 [Urine:995; Emesis/NG output:100; Blood:1100; Chest Tube:220]  Intake/Output this shift: Total I/O In: -  Out: 110 [Urine:60; Chest Tube:50]  Current Meds: Scheduled Meds: . acetaminophen  1,000 mg Oral Q6H   Or  . acetaminophen (TYLENOL) oral liquid 160 mg/5 mL  1,000 mg Per Tube Q6H  . aspirin EC  325 mg Oral Daily   Or  . aspirin   324 mg Per Tube Daily  . bisacodyl  10 mg Oral Daily   Or  . bisacodyl  10 mg Rectal Daily  . chlorhexidine gluconate (MEDLINE KIT)  15 mL Mouth Rinse BID  . docusate sodium  200 mg Oral Daily  . insulin regular  0-10 Units Intravenous TID WC  . mouth rinse  15 mL Mouth Rinse QID  . metoprolol tartrate  12.5 mg Oral BID   Or  . metoprolol tartrate  12.5 mg Per Tube BID  . pantoprazole  40 mg Oral BID  . rosuvastatin  10 mg Oral q1800  . sodium chloride flush  3 mL Intravenous Q12H   Continuous Infusions: . sodium chloride 20 mL/hr at 10/27/16 0400  . sodium chloride    . sodium chloride 20 mL/hr at 10/27/16 0400  . albumin human    . cefUROXime (ZINACEF)  IV Stopped (10/26/16 2323)  . dexmedetomidine (PRECEDEX) IV infusion Stopped (10/26/16 1732)  . DOPamine 3 mcg/kg/min (10/27/16 0400)  . insulin (NOVOLIN-R) infusion 0.6 Units/hr (10/27/16 0741)  . lactated ringers    . lactated ringers Stopped (10/27/16 0200)  . lactated ringers Stopped (10/27/16 0200)  . nitroGLYCERIN 50 mcg/min (10/27/16 0600)  . phenylephrine (NEO-SYNEPHRINE) Adult infusion Stopped (10/26/16 1700)   PRN Meds:.sodium chloride, albumin human, lactated  ringers, metoprolol tartrate, midazolam, morphine injection, ondansetron (ZOFRAN) IV, oxyCODONE, sodium chloride flush, traMADol  General appearance: alert and cooperative Neurologic: intact Heart: regular rate and rhythm, S1, S2 normal, no murmur, click, rub or gallop and paced Lungs: diminished breath sounds bibasilar Abdomen: soft, non-tender; bowel sounds normal; no masses,  no organomegaly Extremities: extremities normal, atraumatic, no cyanosis or edema and Homans sign is negative, no sign of DVT Wound: sternum intact  Lab Results: CBC: Recent Labs  10/26/16 2245 10/26/16 2247 10/27/16 0329  WBC 15.0*  --  14.9*  HGB 9.9* 9.9* 10.0*  HCT 28.6* 29.0* 29.2*  PLT 148*  --  162   BMET:  Recent Labs  10/26/16 0454  10/26/16 2247  10/27/16 0329  NA 131*  < > 138 135  K 4.1  < > 4.7 4.9  CL 104  < > 101 109  CO2 21*  --   --  21*  GLUCOSE 265*  < > 141* 115*  BUN 39*  < > 29* 25*  CREATININE 1.89*  < > 1.40* 1.46*  CALCIUM 8.5*  --   --  7.8*  < > = values in this interval not displayed.  CMET: Lab Results  Component Value Date   WBC 14.9 (H) 10/27/2016   HGB 10.0 (L) 10/27/2016   HCT 29.2 (L) 10/27/2016   PLT 162 10/27/2016   GLUCOSE 115 (H) 10/27/2016   CHOL 158 10/16/2016   TRIG 49 10/16/2016   HDL 48 10/16/2016   LDLCALC 100 (H) 10/16/2016   ALT 19 10/25/2016   AST 23 10/25/2016   NA 135 10/27/2016   K 4.9 10/27/2016   CL 109 10/27/2016   CREATININE 1.46 (H) 10/27/2016   BUN 25 (H) 10/27/2016   CO2 21 (L) 10/27/2016   INR 1.47 10/26/2016   HGBA1C 10.1 (H) 10/16/2016   MICROALBUR 13.50 (H) 01/22/2008      PT/INR:  Recent Labs  10/26/16 1657  LABPROT 18.0*  INR 1.47   Radiology: Dg Chest Port 1 View  Result Date: 10/27/2016 CLINICAL DATA:  Status post CABG yesterday, chest soreness. EXAM: PORTABLE CHEST 1 VIEW COMPARISON:  Portable chest x-ray of Oct 26, 2016 FINDINGS: The lungs are well-expanded. The retrocardiac density remains dense and the left hemidiaphragm obscured. There is no pneumothorax or pneumomediastinum. The heart is normal in size. The pulmonary vascularity is less prominent today. Bilateral chest tubes are stable in position. The mediastinal drain is unchanged. The Swan-Ganz catheter tip projects in the main pulmonary outflow tract. The endotracheal tube tip lies 3.8 cm above the carina. The esophagogastric tube tip and proximal port project below the inferior margin of the image. IMPRESSION: Mild interval improvement in pulmonary interstitial edema. Persistent bibasilar subsegmental atelectasis greatest on the left. No pneumothorax. The support tubes and lines are in reasonable position. Electronically Signed   By: David  Martinique M.D.   On: 10/27/2016 08:22   Dg Chest Port 1  View  Result Date: 10/26/2016 CLINICAL DATA:  CABG. EXAM: PORTABLE CHEST 1 VIEW COMPARISON:  10/26/2016.  10/25/2016. FINDINGS: Endotracheal tube noted with its tip 3.8 cm above the carina. NG tube not this tip below left hemidiaphragm. Swan-Ganz catheter with its tip in the pulmonary outflow tract. Bilateral chest tubes and mediastinal drainage catheter noted in good anatomic position. Small right sided pneumothorax. Prior CABG. Persistent but improved bilateral pulmonary infiltrates/edema. Small left pleural effusion. Right chest wall subcutaneous emphysema. IMPRESSION: 1. Lines and tubes including bilateral chest tubes position as above. Small  right pneumothorax. 2. Prior CABG. Persistent but improved bilateral pulmonary infiltrates/edema. Small left pleural effusion. Critical Value/emergent results were called by telephone at the time of interpretation on 10/26/2016 at 5:32 pm to [place] , who verbally acknowledged these results. Electronically Signed   By: Marcello Moores  Register   On: 10/26/2016 17:36     Assessment/Plan: S/P Procedure(s) (LRB): CORONARY ARTERY BYPASS GRAFTING (CABG) x 4 USING LEFT INTERNAL MAMMARY ARTERY TO LAD AND ENDOSCOPICALLY HARVESTED GREATER SAPHENOUS VEIN TO OM 1, 2 AND TO DIAG (N/A) TRANSESOPHAGEAL ECHOCARDIOGRAM (TEE) (N/A) ENDOVEIN HARVEST OF GREATER SAPHENOUS VEIN (Right) Mobilize Diuresis Diabetes control d/c tubes/lines See progression orders  Renal function stable D/c mt leave pleural tubes  Discussed with Dr Posey Pronto Expected Acute  Blood - loss Anemia and prop anemia of chronic disease      Grace Isaac 10/27/2016 8:32 AM

## 2016-10-27 NOTE — Progress Notes (Signed)
CT surgery p.m. Rounds  Patient and stable sinus rhythm Chest tubes with serous output No urine output from Foley for 3 hours Will resume 2 g dopamine, give 1 bottle of 25% albumin and then 1 dose of Lasix 40 mg IV to maintain urine output P.m. Creatinine 1.7, hemoglobin 10 g, potassium 5.0

## 2016-10-27 NOTE — Procedures (Signed)
Extubation Procedure Note  Patient Details:   Name: Shawn Collins DOB: 03-04-1951 MRN: [id number]   Airway Documentation:  Airway 8 mm (Active)  Secured at (cm) 25 cm 10/27/2016  3:43 AM  Measured From Lips 10/27/2016  3:43 AM  Secured Location Left 10/27/2016  3:43 AM  Secured By Pink Tape 10/27/2016  3:43 AM  Site Condition Dry 10/27/2016  3:43 AM    Evaluation  O2 sats: stable throughout Complications: No apparent complications Patient did tolerate procedure well. Bilateral Breath Sounds: Clear   Yes  Chriss Driver [facility] 10/27/2016, 5:57 AM

## 2016-10-27 NOTE — Progress Notes (Signed)
CSW continuing to follow for possible SNF placement depending on patient mobility following CABG  Shawn Collins, [place] Social Worker [phone]

## 2016-10-27 NOTE — Progress Notes (Signed)
Patient with increase in BP requiring increase titration of nitro gtt. Dr. Servando Snare called and updated. New order to restart home medication of Norvasc. Will administer and continue to monitor.

## 2016-10-27 NOTE — Progress Notes (Addendum)
Patient currently being  Followed by CM and CSW for disposition planning. Patient has not been assessed by PT since CABG. Patient will require qualifying skillable need for SNF placement at DC. Spoke with patient about DC plan, he is going to call family and friends to arrange 24 hour supervision so that if he does not qualify for SNF he is safe at home. CM will continue to follow.

## 2016-10-28 ENCOUNTER — Inpatient Hospital Stay (HOSPITAL_COMMUNITY): Payer: Medicare Other

## 2016-10-28 ENCOUNTER — Encounter (HOSPITAL_COMMUNITY): Payer: Self-pay

## 2016-10-28 LAB — BASIC METABOLIC PANEL
Anion gap: 6 (ref 5–15)
BUN: 28 mg/dL — ABNORMAL HIGH (ref 6–20)
CO2: 22 mmol/L (ref 22–32)
Calcium: 8.3 mg/dL — ABNORMAL LOW (ref 8.9–10.3)
Chloride: 106 mmol/L (ref 101–111)
Creatinine, Ser: 1.77 mg/dL — ABNORMAL HIGH (ref 0.61–1.24)
GFR calc Af Amer: 44 mL/min — ABNORMAL LOW (ref >60)
GFR calc non Af Amer: 38 mL/min — ABNORMAL LOW (ref >60)
Glucose, Bld: 70 mg/dL (ref 65–99)
Potassium: 4.4 mmol/L (ref 3.5–5.1)
Sodium: 134 mmol/L — ABNORMAL LOW (ref 135–145)

## 2016-10-28 LAB — CBC
HCT: 28.9 % — ABNORMAL LOW (ref 39.0–52.0)
Hemoglobin: 9.9 g/dL — ABNORMAL LOW (ref 13.0–17.0)
MCH: 29.6 pg (ref 26.0–34.0)
MCHC: 34.3 g/dL (ref 30.0–36.0)
MCV: 86.5 fL (ref 78.0–100.0)
Platelets: 173 10*3/uL (ref 150–400)
RBC: 3.34 MIL/uL — ABNORMAL LOW (ref 4.22–5.81)
RDW: 13.6 % (ref 11.5–15.5)
WBC: 15.9 10*3/uL — ABNORMAL HIGH (ref 4.0–10.5)

## 2016-10-28 LAB — GLUCOSE, CAPILLARY
GLUCOSE-CAPILLARY: 72 mg/dL (ref 65–99)
Glucose-Capillary: 71 mg/dL (ref 65–99)
Glucose-Capillary: 129 mg/dL — ABNORMAL HIGH (ref 65–99)
Glucose-Capillary: 107 mg/dL — ABNORMAL HIGH (ref 65–99)
Glucose-Capillary: 115 mg/dL — ABNORMAL HIGH (ref 65–99)

## 2016-10-28 MED ORDER — ALBUMIN HUMAN 5 % IV SOLN
INTRAVENOUS | Status: AC
  Filled 2016-10-28: qty 250

## 2016-10-28 MED ORDER — ALBUMIN HUMAN 5 % IV SOLN
12.5000 g | Freq: Once | INTRAVENOUS | Status: AC
  Administered 2016-10-28: 12.5 g via INTRAVENOUS

## 2016-10-28 MED ORDER — FUROSEMIDE 10 MG/ML IJ SOLN
40.0000 mg | Freq: Once | INTRAMUSCULAR | Status: AC
  Administered 2016-10-28: 40 mg via INTRAVENOUS
  Filled 2016-10-28: qty 4

## 2016-10-28 MED ORDER — FUROSEMIDE 10 MG/ML IJ SOLN
40.0000 mg | Freq: Two times a day (BID) | INTRAMUSCULAR | Status: DC
  Administered 2016-10-28 – 2016-10-29 (×2): 40 mg via INTRAVENOUS
  Filled 2016-10-28 (×2): qty 4

## 2016-10-28 MED ORDER — FUROSEMIDE 10 MG/ML IJ SOLN: 40.0000 mg | Freq: Once | INTRAMUSCULAR | Status: DC

## 2016-10-28 NOTE — Progress Notes (Addendum)
TCTS DAILY ICU PROGRESS NOTE                   [place].[address]            [place] [address]          [phone]   2 Days Post-Op Procedure(s) (LRB): CORONARY ARTERY BYPASS GRAFTING (CABG) x 4 USING LEFT INTERNAL MAMMARY ARTERY TO LAD AND ENDOSCOPICALLY HARVESTED GREATER SAPHENOUS VEIN TO OM 1, 2 AND TO DIAG (N/A) TRANSESOPHAGEAL ECHOCARDIOGRAM (TEE) (N/A) ENDOVEIN HARVEST OF GREATER SAPHENOUS VEIN (Right)  Total Length of Stay:  LOS: 12 days   Subjective: Sleepy. Asking about breakfast.   Objective: Vital signs in last 24 hours: Temp:  [97.5 F (36.4 C)-98 F (36.7 C)] 97.8 F (36.6 C) (06/01 0742) Pulse Rate:  [74-86] 80 (06/01 0800) Cardiac Rhythm: Normal sinus rhythm (06/01 0830) Resp:  [8-21] 10 (06/01 0800) BP: (106-138)/(66-95) 119/77 (06/01 0800) SpO2:  [91 %-100 %] 99 % (06/01 0800) Arterial Line BP: (87-155)/(55-86) 110/67 (06/01 0800) Weight:  [74.9 kg (165 lb 2 oz)] 74.9 kg (165 lb 2 oz) (06/01 0500)  Filed Weights   10/26/16 1700 10/27/16 0500 10/28/16 0500  Weight: 72.5 kg (159 lb 13.3 oz) 74.7 kg (164 lb 9.6 oz) 74.9 kg (165 lb 2 oz)    Weight change: 2.4 kg (5 lb 4.7 oz)   Hemodynamic parameters for last 24 hours: PAP: (37)/(22) 37/22  Intake/Output from previous day: 05/31 0701 - 06/01 0700 In: 1372.9 [P.O.:450; I.V.:772.9; IV Piggyback:150] Out: 2390 [Urine:1150; Chest Tube:1240]  Intake/Output this shift: Total I/O In: 26.5 [I.V.:26.5] Out: 125 [Urine:125]  Current Meds: Scheduled Meds: . acetaminophen  1,000 mg Oral Q6H   Or  . acetaminophen (TYLENOL) oral liquid 160 mg/5 mL  1,000 mg Per Tube Q6H  . amLODipine  10 mg Oral Daily  . aspirin EC  325 mg Oral Daily   Or  . aspirin  324 mg Per Tube Daily  . bisacodyl  10 mg Oral Daily   Or  . bisacodyl  10 mg Rectal Daily  . docusate sodium  200 mg Oral Daily  . enoxaparin (LOVENOX) injection  30 mg Subcutaneous QHS  . furosemide  40 mg Intravenous Once  . insulin aspart   0-24 Units Subcutaneous Q4H  . insulin detemir  15 Units Subcutaneous Daily  . metoprolol tartrate  12.5 mg Oral BID   Or  . metoprolol tartrate  12.5 mg Per Tube BID  . pantoprazole  40 mg Oral BID  . rosuvastatin  10 mg Oral q1800  . sodium chloride flush  3 mL Intravenous Q12H   Continuous Infusions: . sodium chloride Stopped (10/27/16 0926)  . sodium chloride    . sodium chloride 20 mL/hr at 10/28/16 0800  . cefUROXime (ZINACEF)  IV Stopped (10/27/16 2241)  . DOPamine 2 mcg/kg/min (10/28/16 0800)  . insulin (NOVOLIN-R) infusion Stopped (10/27/16 1303)  . lactated ringers    . lactated ringers Stopped (10/27/16 1701)  . lactated ringers Stopped (10/27/16 0200)  . nitroGLYCERIN Stopped (10/28/16 0813)  . phenylephrine (NEO-SYNEPHRINE) Adult infusion Stopped (10/26/16 1700)   PRN Meds:.sodium chloride, lactated ringers, metoprolol tartrate, ondansetron (ZOFRAN) IV, oxyCODONE, sodium chloride flush, traMADol  General appearance: cooperative and no distress Heart: regular rate and rhythm, S1, S2 normal, no murmur, click, rub or gallop Lungs: clear to auscultation bilaterally Abdomen: soft, non-tender; bowel sounds normal; no masses,  no organomegaly Extremities: 1-2+ pitting edema in bilateral lower extremities Wound: clean  and dry dressed with sterile sternal dressing. EVH site c/d/i without drainage  Lab Results: CBC: Recent Labs  10/27/16 1614 10/27/16 1626 10/28/16 0459  WBC 17.6*  --  15.9*  HGB 10.8* 9.9* 9.9*  HCT 30.6* 29.0* 28.9*  PLT 174  --  173   BMET:  Recent Labs  10/27/16 0329  10/27/16 1626 10/28/16 0459  NA 135  --  136 134*  K 4.9  --  5.0 4.4  CL 109  --  105 106  CO2 21*  --   --  22  GLUCOSE 115*  --  112* 70  BUN 25*  --  26* 28*  CREATININE 1.46*  < > 1.70* 1.77*  CALCIUM 7.8*  --   --  8.3*  < > = values in this interval not displayed.  CMET: Lab Results  Component Value Date   WBC 15.9 (H) 10/28/2016   HGB 9.9 (L) 10/28/2016    HCT 28.9 (L) 10/28/2016   PLT 173 10/28/2016   GLUCOSE 70 10/28/2016   CHOL 158 10/16/2016   TRIG 49 10/16/2016   HDL 48 10/16/2016   LDLCALC 100 (H) 10/16/2016   ALT 19 10/25/2016   AST 23 10/25/2016   NA 134 (L) 10/28/2016   K 4.4 10/28/2016   CL 106 10/28/2016   CREATININE 1.77 (H) 10/28/2016   BUN 28 (H) 10/28/2016   CO2 22 10/28/2016   INR 1.47 10/26/2016   HGBA1C 10.1 (H) 10/16/2016   MICROALBUR 13.50 (H) 01/22/2008      PT/INR:  Recent Labs  10/26/16 1657  LABPROT 18.0*  INR 1.47   Radiology: Dg Chest Port 1 View  Result Date: 10/28/2016 CLINICAL DATA:  Chest tube. EXAM: PORTABLE CHEST 1 VIEW COMPARISON:  10/27/2016. FINDINGS: Interim removal of endotracheal tube, NG tube, Swan-Ganz catheter, mediastinal drainage catheter. Right IJ sheath and bilateral chest tubes in stable position . Prior CABG. Heart size normal. Partial clearing of bibasilar pulmonary infiltrates/edema. Low lung volumes with bibasilar atelectasis. Small left pleural effusion. No pneumothorax. IMPRESSION: 1. Removal of endotracheal tube, NG tube, Swan-Ganz catheter, mediastinal drainage catheter. Right IJ sheath and bilateral chest tubes in stable position. 2. Interim improvement of bibasilar infiltrates/edema. Lung volumes with bibasilar atelectasis . Small left pleural effusion. 3.  Prior CABG.  Heart size stable. Electronically Signed   By: Marcello Moores  Register   On: 10/28/2016 08:00     Assessment/Plan: S/P Procedure(s) (LRB): CORONARY ARTERY BYPASS GRAFTING (CABG) x 4 USING LEFT INTERNAL MAMMARY ARTERY TO LAD AND ENDOSCOPICALLY HARVESTED GREATER SAPHENOUS VEIN TO OM 1, 2 AND TO DIAG (N/A) TRANSESOPHAGEAL ECHOCARDIOGRAM (TEE) (N/A) ENDOVEIN HARVEST OF GREATER SAPHENOUS VEIN (Right)  1. CV- NSR in the 70s. BP is rising, now off nitro gtt. On Lopressor BID and Norvasc to start today.  2. Pulm- Tolerating room air. CXR showed improvement of basilar infiltrates/edema. Lung volumes with bibasilar  atelectasis. Small pleural effusion. Chest tube drainage > 1 L over 24 hours. Will keep today.  3. Renal-creatinine 1.77 this morning, electrolytes okay. Nephrology consulted for renal optimization. Will continue to augment his urine output with intermittent doses of lasix and work on weaning off dopamine, current on 14mcg/kg/min. Urine output remains low.  4. H and H remains stable. Acute blood loss anemia-expected.  5. Endo-blood glucose level well controlled.  6. DVT proph-lovenox and SCDs  Plan: Adding Norvasc for better BP control. Work on renal optimization. Remains on dopamine gtt. Intermittent Lasix for urine output. Ambulate. Encourage incentive spirometry. Keep chest  tubes due to output.     Elgie Collard 10/28/2016 8:46 AM   Still serous fluid draining from chest tubes  reanl function at baseline - renal following  I have seen and examined Shawn Collins and agree with the above assessment  and plan.  Grace Isaac MD Beeper [phone] Office [phone] 10/28/2016 6:34 PM

## 2016-10-28 NOTE — Progress Notes (Signed)
T[facility] BRIEF SICU PROGRESS NOTE  2 Days Post-Op  S/P Procedure(s) (LRB): CORONARY ARTERY BYPASS GRAFTING (CABG) x 4 USING LEFT INTERNAL MAMMARY ARTERY TO LAD AND ENDOSCOPICALLY HARVESTED GREATER SAPHENOUS VEIN TO OM 1, 2 AND TO DIAG (N/A) TRANSESOPHAGEAL ECHOCARDIOGRAM (TEE) (N/A) ENDOVEIN HARVEST OF GREATER SAPHENOUS VEIN (Right)   Stable day  Plan: Continue current plan  Rexene Alberts, MD 10/28/2016 9:12 PM

## 2016-10-28 NOTE — Op Note (Signed)
NAME:  Shawn Collins, Shawn Collins                    ACCOUNT NO.:  MEDICAL RECORD NO.:  [id number]  LOCATION:                                 FACILITY:  PHYSICIAN:  Lanelle Bal, MD         DATE OF BIRTH:  DATE OF PROCEDURE:  10/26/2016 DATE OF DISCHARGE:                              OPERATIVE REPORT   PREOPERATIVE DIAGNOSES: 1. Critical left anterior descending diagonal obstruction and 3-vessel     coronary artery disease with recent non-ST-elevation myocardial     infarction. 2. Moderate bilateral pleural effusions. 3. Anemia of chronic disease. 4. 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  POSTOPERATIVE DIAGNOSES: 1. Critical left anterior descending diagonal obstruction and 3-vessel     coronary artery disease with recent non-ST-elevation myocardial     infarction. 2. Moderate bilateral pleural effusions. 3. Anemia of chronic disease. 4. 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  SURGICAL PROCEDURES:  Coronary artery bypass grafting x4 with left internal mammary to the left anterior descending coronary artery, reverse saphenous vein graft to the diagonal coronary artery, sequential reverse saphenous vein graft to the first and second obtuse marginal coronary artery, evacuation of bilateral pleural effusions, right greater saphenous vein endoscopic harvesting of the thigh and upper calf.  SURGEON:  Lanelle Bal, MD  FIRST ASSISTANT:  Nicholes Rough, PA  BRIEF HISTORY:  The patient is a 66 year old, diabetic male, who presented more than a week prior to surgery with nausea, vomiting, and GI complaints.  He has known chronic kidney disease, followed by Dr. Posey Pronto.  He was admitted and troponins were positive, consistent with a non-ST-elevation myocardial infarction.  He underwent cardiac catheterization after stabilization of his renal function by Dr. Martinique, which demonstrated long irregular greater than  90% stenosis of the LAD involving the takeoff of the diagonal.  In addition, first obtuse marginal and circumflex at this level had a 70% to 80% stenosis.  The distal circumflex was a dominant vessel.  Right coronary artery was small with distal diabetic disease, but not bypassable.  Overall, ventricular function was preserved.  The patient had poor pulmonary function with FEV1 35% of predicted and diffusion capacity 35% of predicted.  With the patient's comorbidities and after consultation with Cardiology, it was decided that it is still high risk because of the anatomic features of these lesions, diabetes, and 3-vessel disease including high-grade LAD diagonal disease that bypass surgery even though higher risk was the preferred treatment.  Risks and options were discussed with the patient in detail and he was agreeable and signed informed consent.  DESCRIPTION OF PROCEDURE:  With Swan-Ganz and arterial line monitors in place, the patient underwent general endotracheal anesthesia without incident.  Skin of chest and legs was prepped with Betadine and draped in usual sterile manner.  Appropriate time-out was performed and we proceeded with endoscopic vein harvesting of the right greater saphenous vein from the calf and upper thigh.  The vein was slightly small, but was of good quality.  Median sternotomy was performed.  Left internal mammary artery was dissected  down as a pedicle graft.  The distal artery was divided, had good free flow.  As we dissected down the mammary artery, it was apparent that the patient had significant pleural effusions on the left.  This was evacuated and was more than 1000 mL. In a similar fashion, the right pleural space was opened and at least equal amount of pleural fluid was present on the right.  The pericardium was opened.  Overall, ventricular function appeared preserved.  The patient was systemically heparinized.  Ascending aorta was cannulated. The  right atrium was cannulated and aortic root vent cardioplegia needle was introduced into the ascending aorta.  The patient was placed on cardiopulmonary bypass 2.4 L/min/m2.  Sites of anastomosis were selected and dissected out of the epicardium.  The patient's body temperature cooled to 32 degrees.  Aortic crossclamp was applied and 500 mL cold blood potassium cardioplegia was administered with diastolic arrest of the heart.  Myocardial septal temperature was monitored throughout the cross-clamp.  Attention was turned first to the OM1 and OM2 vessels. The first OM was opened and was 1.3 to 1.4 mm in size.  Using a diamond- type side-to-side anastomosis with a running 8-0 Prolene, a distal anastomosis was performed.  Distal extent of the same vein was then trimmed to the appropriate length.  The second obtuse marginal vessel was opened and admitted was of similar size approximately 1.3 to 1.4 mm in size.  Using a running 8-0 Prolene, distal anastomosis was performed. We then turned our attention to the diagonal coronary artery which was slightly smaller, but reasonable vessel for bypass.  A 1 probe passed distally easily, vessel was 1.2 to 1.4 mm in size.  Using a running 8-0 Prolene, distal anastomosis was performed with segment of reverse saphenous vein graft.  Additional cold blood cardioplegia was administered down the vein graft.  Attention was then turned to the left anterior descending coronary artery in the middle portion of the vessel. The LAD was opened and admitted a 1.5 mm probe distally.  Using a running 8-0 Prolene, the left internal mammary artery was anastomosed to the left anterior descending coronary artery.  With the crossclamp still in place, 2-punch aortotomies were performed and each of the two vein grafts were anastomosed to the ascending aorta.  The bulldog was removed from the mammary artery with prompt rise in myocardial septal temperature.  The heart was allowed  to passively fill and de-air and aortic crossclamp and the proximal anastomosis were completed and the cross-clamp was removed.  Total cross-clamp time was 86 minutes.  The patient spontaneously converted to a sinus rhythm.  Sites of anastomosis were inspected and free of bleeding.  Atrial and ventricular pacing wires were applied.  The patient was then ventilated and weaned from cardiopulmonary bypass without difficulty.  He remained hemodynamically stable, was decannulated in usual fashion.  Protamine sulfate was administered.  With the operative field hemostatic, bilateral pleural chest tubes were placed and a single mediastinal tube was placed.  The pericardium was loosely reapproximated.  Sternum was closed with #6 stainless steel wire.  Fascia was closed with interrupted 0 Vicryl, running 3-0 Vicryl in subcutaneous tissue, 4-0 subcuticular stitch in the skin edges.  Dry dressings were applied.  Sponge and needle count was reported as correct after completion of the procedure.  The patient tolerated the procedure without obvious complication and was transferred to the Surgical Intensive Care Unit for further postoperative care.     Lanelle Bal, MD  EG/MEDQ  D:  10/28/2016  T:  10/28/2016  Job:  [id number]  cc:   Peter M. Martinique, M.D.

## 2016-10-28 NOTE — Progress Notes (Signed)
CSW continuing to follow for assistance with SNF placement if needed- pt awaiting PT eval post surgery to assess needs  Jorge Ny, [place] Social Worker [phone]

## 2016-10-28 NOTE — Progress Notes (Signed)
Patient ID: Shawn Collins, male   DOB: [birth year], 66 y.o.   MRN: [id number]  [facility] Progress Note   Assessment/ Plan:   1. 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 Rise of creatinine noted which may simply reflect re-equilibration following intensive fluid administration perioperatively for CABG. Agree with augmenting his urine output with intermittent bolus doses of furosemide and wean off of dopamine per CTS. 2. Coronary artery disease/NSTEMI now status post CABG: POD #2 s/p four-vessel CABG using LIMA to LAD, greater saphenous to OM1, OM 2 and diagonal. Bilateral chest tubes with high-volume output noted over the past 24 hours. 3. Hypertension: Intermittent elevations noted on dopamine drip-monitor with diuretics. 4. Anemia: Likely anemia of chronic disease including chronic kidney disease-low iron saturation noted, s/p intravenous iron. 5. Secondary hyperparathyroidism: PTH level within acceptable range for chronic kidney disease stage IV, calcium and phosphorus within goal.  Subjective:   Drop in urine output noted overnight prompting furosemide bolus and restarting dopamine drip.   Objective:   BP 119/77 (BP Location: Right Arm)   Pulse 80   Temp 97.8 F (36.6 C) (Oral)   Resp 10   Ht 5\' 10"  (1.778 m)   Wt 74.9 kg (165 lb 2 oz)   SpO2 99%   BMI 23.69 kg/m   Intake/Output Summary (Last 24 hours) at 10/28/16 0825 Last data filed at 10/28/16 0813  Gross per 24 hour  Intake          1319.23 ml  Output             2280 ml  Net          -960.77 ml   Weight change: 2.4 kg (5 lb 4.7 oz)  Physical Exam: [id number] to be resting comfortably CVS: Pulse regular rhythm, normal rate, S1 and S2 normal Resp: Anteriorly clear to auscultation Abd: Soft, flat, nontender Ext: Trace ankle  edema  Imaging: Dg Chest Port 1 View  Result Date: 10/28/2016 CLINICAL DATA:  Chest tube. EXAM: PORTABLE CHEST 1 VIEW COMPARISON:  10/27/2016. FINDINGS: Interim removal of endotracheal tube, NG tube, Swan-Ganz catheter, mediastinal drainage catheter. Right IJ sheath and bilateral chest tubes in stable position . Prior CABG. Heart size normal. Partial clearing of bibasilar pulmonary infiltrates/edema. Low lung volumes with bibasilar atelectasis. Small left pleural effusion. No pneumothorax. IMPRESSION: 1. Removal of endotracheal tube, NG tube, Swan-Ganz catheter, mediastinal drainage catheter. Right IJ sheath and bilateral chest tubes in stable position. 2. Interim improvement of bibasilar infiltrates/edema. Lung volumes with bibasilar atelectasis . Small left pleural effusion. 3.  Prior CABG.  Heart size stable. Electronically Signed   By: Marcello Moores  Register   On: 10/28/2016 08:00   Dg Chest Port 1 View  Result Date: 10/27/2016 CLINICAL DATA:  Status post CABG yesterday, chest soreness. EXAM: PORTABLE CHEST 1 VIEW COMPARISON:  Portable chest x-ray of Oct 26, 2016 FINDINGS: The lungs are well-expanded. The retrocardiac density remains dense and the left hemidiaphragm obscured. There is no pneumothorax or pneumomediastinum. The heart is normal in size. The pulmonary vascularity is less prominent today. Bilateral chest tubes are stable in position. The mediastinal drain is unchanged. The Swan-Ganz catheter tip projects in the main pulmonary outflow tract. The endotracheal tube tip lies 3.8 cm above the carina. The esophagogastric tube tip and proximal port project below the inferior margin of the image. IMPRESSION: Mild interval improvement in pulmonary  interstitial edema. Persistent bibasilar subsegmental atelectasis greatest on the left. No pneumothorax. The support tubes and lines are in reasonable position. Electronically Signed   By: David  Martinique M.D.   On: 10/27/2016 08:22   Dg Chest Port 1  View  Result Date: 10/26/2016 CLINICAL DATA:  CABG. EXAM: PORTABLE CHEST 1 VIEW COMPARISON:  10/26/2016.  10/25/2016. FINDINGS: Endotracheal tube noted with its tip 3.8 cm above the carina. NG tube not this tip below left hemidiaphragm. Swan-Ganz catheter with its tip in the pulmonary outflow tract. Bilateral chest tubes and mediastinal drainage catheter noted in good anatomic position. Small right sided pneumothorax. Prior CABG. Persistent but improved bilateral pulmonary infiltrates/edema. Small left pleural effusion. Right chest wall subcutaneous emphysema. IMPRESSION: 1. Lines and tubes including bilateral chest tubes position as above. Small right pneumothorax. 2. Prior CABG. Persistent but improved bilateral pulmonary infiltrates/edema. Small left pleural effusion. Critical Value/emergent results were called by telephone at the time of interpretation on 10/26/2016 at 5:32 pm to [place] , who verbally acknowledged these results. Electronically Signed   By: Marcello Moores  Register   On: 10/26/2016 17:36    Labs: BMET  Recent Labs Lab 10/22/16 9147 10/23/16 8295 10/24/16 0430 10/25/16 6213 10/26/16 0865  10/26/16 1303 10/26/16 1424 10/26/16 1528 10/26/16 1702 10/26/16 2245 10/26/16 2247 10/27/16 0329 10/27/16 1614 10/27/16 1626 10/28/16 0459  NA 132* 131* 130* 131* 131*  < > 135 135 132* 139  --  138 135  --  136 134*  K 3.7 3.7 3.9 4.3 4.1  < > 4.2 4.6 3.9 3.6  --  4.7 4.9  --  5.0 4.4  CL 105 105 103 102 104  < > 104 102 103  --   --  101 109  --  105 106  CO2 19* 17* 18* 19* 21*  --   --   --   --   --   --   --  21*  --   --  22  GLUCOSE 193* 197* 208* 181* 265*  < > 188* 170* 171* 115*  --  141* 115*  --  112* 70  BUN 54* 53* 53* 43* 39*  < > 28* 29* 27*  --   --  29* 25*  --  26* 28*  CREATININE 2.07* 2.14* 2.10* 1.99* 1.89*  < > 1.30* 1.20 1.40*  --  1.45* 1.40* 1.46* 1.70* 1.70* 1.77*  CALCIUM 7.9* 8.0* 8.4* 8.4* 8.5*  --   --   --   --   --   --   --  7.8*  --   --  8.3*   PHOS 2.5 2.9 2.7  --   --   --   --   --   --   --   --   --   --   --   --   --   < > = values in this interval not displayed. CBC  Recent Labs Lab 10/26/16 2245  10/27/16 0329 10/27/16 1614 10/27/16 1626 10/28/16 0459  WBC 15.0*  --  14.9* 17.6*  --  15.9*  HGB 9.9*  < > 10.0* 10.8* 9.9* 9.9*  HCT 28.6*  < > 29.2* 30.6* 29.0* 28.9*  MCV 84.6  --  84.9 86.0  --  86.5  PLT 148*  --  162 174  --  173  < > = values in this interval not displayed. Medications:    . acetaminophen  1,000 mg Oral Q6H   Or  .  acetaminophen (TYLENOL) oral liquid 160 mg/5 mL  1,000 mg Per Tube Q6H  . amLODipine  10 mg Oral Daily  . aspirin EC  325 mg Oral Daily   Or  . aspirin  324 mg Per Tube Daily  . bisacodyl  10 mg Oral Daily   Or  . bisacodyl  10 mg Rectal Daily  . docusate sodium  200 mg Oral Daily  . enoxaparin (LOVENOX) injection  30 mg Subcutaneous QHS  . insulin aspart  0-24 Units Subcutaneous Q4H  . insulin detemir  15 Units Subcutaneous Daily  . metoprolol tartrate  12.5 mg Oral BID   Or  . metoprolol tartrate  12.5 mg Per Tube BID  . pantoprazole  40 mg Oral BID  . rosuvastatin  10 mg Oral q1800  . sodium chloride flush  3 mL Intravenous Q12H   Elmarie Shiley, MD 10/28/2016, 8:25 AM

## 2016-10-29 ENCOUNTER — Inpatient Hospital Stay (HOSPITAL_COMMUNITY): Payer: Medicare Other

## 2016-10-29 LAB — GLUCOSE, CAPILLARY
GLUCOSE-CAPILLARY: 172 mg/dL — AB (ref 65–99)
GLUCOSE-CAPILLARY: 171 mg/dL — AB (ref 65–99)
Glucose-Capillary: 103 mg/dL — ABNORMAL HIGH (ref 65–99)
Glucose-Capillary: 114 mg/dL — ABNORMAL HIGH (ref 65–99)
Glucose-Capillary: 189 mg/dL — ABNORMAL HIGH (ref 65–99)
Glucose-Capillary: 251 mg/dL — ABNORMAL HIGH (ref 65–99)
Glucose-Capillary: 68 mg/dL (ref 65–99)

## 2016-10-29 LAB — TYPE AND SCREEN
ABO/RH(D): O POS
Antibody Screen: NEGATIVE
Unit division: 0
Unit division: 0
Unit division: 0
Unit division: 0
Unit division: 0
Unit division: 0

## 2016-10-29 LAB — BASIC METABOLIC PANEL
Anion gap: 6 (ref 5–15)
BUN: 33 mg/dL — ABNORMAL HIGH (ref 6–20)
CO2: 25 mmol/L (ref 22–32)
Calcium: 8.3 mg/dL — ABNORMAL LOW (ref 8.9–10.3)
Chloride: 106 mmol/L (ref 101–111)
Creatinine, Ser: 1.89 mg/dL — ABNORMAL HIGH (ref 0.61–1.24)
GFR calc Af Amer: 41 mL/min — ABNORMAL LOW (ref >60)
GFR calc non Af Amer: 35 mL/min — ABNORMAL LOW (ref >60)
Glucose, Bld: 39 mg/dL — CL (ref 65–99)
Potassium: 3.8 mmol/L (ref 3.5–5.1)
Sodium: 137 mmol/L (ref 135–145)

## 2016-10-29 LAB — BPAM RBC
Blood Product Expiration Date: 201806232359
Blood Product Expiration Date: 201806232359
Blood Product Expiration Date: 201806232359
Blood Product Expiration Date: 201806232359
Blood Product Expiration Date: 201806252359
Blood Product Expiration Date: 201806252359
ISSUE DATE / TIME: 201805301112
ISSUE DATE / TIME: 201805301112
ISSUE DATE / TIME: 201805301300
ISSUE DATE / TIME: 201805301300
Unit Type and Rh: 5100
Unit Type and Rh: 5100
Unit Type and Rh: 5100
Unit Type and Rh: 5100
Unit Type and Rh: 5100
Unit Type and Rh: 5100

## 2016-10-29 LAB — CBC
HCT: 29.5 % — ABNORMAL LOW (ref 39.0–52.0)
Hemoglobin: 10 g/dL — ABNORMAL LOW (ref 13.0–17.0)
MCH: 29.6 pg (ref 26.0–34.0)
MCHC: 33.9 g/dL (ref 30.0–36.0)
MCV: 87.3 fL (ref 78.0–100.0)
Platelets: 208 10*3/uL (ref 150–400)
RBC: 3.38 MIL/uL — ABNORMAL LOW (ref 4.22–5.81)
RDW: 13.6 % (ref 11.5–15.5)
WBC: 16 10*3/uL — ABNORMAL HIGH (ref 4.0–10.5)

## 2016-10-29 MED ORDER — INSULIN ASPART 100 UNIT/ML ~~LOC~~ SOLN
0.0000 [IU] | Freq: Three times a day (TID) | SUBCUTANEOUS | Status: DC
  Administered 2016-10-29: 5 [IU] via SUBCUTANEOUS
  Administered 2016-10-29 – 2016-10-30 (×3): 2 [IU] via SUBCUTANEOUS
  Administered 2016-10-30 – 2016-10-31 (×2): 5 [IU] via SUBCUTANEOUS
  Administered 2016-10-31: 2 [IU] via SUBCUTANEOUS
  Administered 2016-10-31: 7 [IU] via SUBCUTANEOUS
  Administered 2016-11-01 (×2): 2 [IU] via SUBCUTANEOUS
  Administered 2016-11-01 – 2016-11-02 (×2): 3 [IU] via SUBCUTANEOUS
  Administered 2016-11-02: 2 [IU] via SUBCUTANEOUS
  Administered 2016-11-02: 5 [IU] via SUBCUTANEOUS
  Administered 2016-11-03: 3 [IU] via SUBCUTANEOUS
  Administered 2016-11-03: 2 [IU] via SUBCUTANEOUS

## 2016-10-29 MED ORDER — ALBUMIN HUMAN 25 % IV SOLN
25.0000 g | Freq: Once | INTRAVENOUS | Status: AC
  Administered 2016-10-29: 25 g via INTRAVENOUS
  Filled 2016-10-29: qty 50

## 2016-10-29 MED ORDER — DEXTROSE 50 % IV SOLN
25.0000 mL | Freq: Once | INTRAVENOUS | Status: AC
  Administered 2016-10-29: 25 mL via INTRAVENOUS

## 2016-10-29 MED ORDER — DEXTROSE 50 % IV SOLN
INTRAVENOUS | Status: AC
  Administered 2016-10-29: 50 mL
  Filled 2016-10-29: qty 50

## 2016-10-29 MED ORDER — ALBUMIN HUMAN 25 % IV SOLN
25.0000 g | Freq: Once | INTRAVENOUS | Status: AC
  Administered 2016-10-30: 25 g via INTRAVENOUS
  Filled 2016-10-29 (×2): qty 50

## 2016-10-29 MED ORDER — DEXTROSE 50 % IV SOLN
INTRAVENOUS | Status: AC
  Filled 2016-10-29: qty 50

## 2016-10-29 MED ORDER — FUROSEMIDE 10 MG/ML IJ SOLN
80.0000 mg | Freq: Once | INTRAMUSCULAR | Status: AC
  Administered 2016-10-30: 80 mg via INTRAVENOUS
  Filled 2016-10-29: qty 8

## 2016-10-29 MED ORDER — FUROSEMIDE 10 MG/ML IJ SOLN
80.0000 mg | Freq: Once | INTRAMUSCULAR | Status: AC
  Administered 2016-10-29: 80 mg via INTRAVENOUS
  Filled 2016-10-29: qty 8

## 2016-10-29 NOTE — Progress Notes (Addendum)
Hypoglycemic Event  CBG: 34 Time: 04:50  Treatment: D50 IV 50 mL  Symptoms: Sweaty  Follow-up CBG: Time: 05:22 CBG Result:172  Possible Reasons for Event: Inadequate meal intake and Unknown  Comments/MD notified:Will promote adequate meal intake and discuss other possible causes for event with MD during rounds. Will continue to monitor.    Olga Millers Alexza Norbeck 10/29/16

## 2016-10-29 NOTE — Progress Notes (Signed)
      Shawn Collins       Shawn Collins,Shawn Collins             [phone]        CARDIOTHORACIC SURGERY PROGRESS NOTE   R3 Days Post-Op Procedure(s) (LRB): CORONARY ARTERY BYPASS GRAFTING (CABG) x 4 USING LEFT INTERNAL MAMMARY ARTERY TO LAD AND ENDOSCOPICALLY HARVESTED GREATER SAPHENOUS VEIN TO OM 1, 2 AND TO DIAG (N/A) TRANSESOPHAGEAL ECHOCARDIOGRAM (TEE) (N/A) ENDOVEIN HARVEST OF GREATER SAPHENOUS VEIN (Right)  Subjective: Feels okay.  Denies pain, SOB.  Ambulated once this morning  Objective: Vital signs: BP Readings from Last 1 Encounters:  10/29/16 126/79   Pulse Readings from Last 1 Encounters:  10/29/16 63   Resp Readings from Last 1 Encounters:  10/29/16 12   Temp Readings from Last 1 Encounters:  10/29/16 98.2 F (36.8 C) (Oral)    Hemodynamics:    Physical Exam:  Rhythm:   sinus  Breath sounds: Diminished at bases  Heart sounds:  RRR  Incisions:  Clean and dry  Abdomen:  Soft, non-distended, non-tender  Extremities:  Warm, well-perfused    Intake/Output from previous day: 06/01 0701 - 06/02 0700 In: 680.9 [P.O.:60; I.V.:570.9; IV Piggyback:50] Out: 2025 [Urine:1625; Chest Tube:400] Intake/Output this shift: Total I/O In: -  Out: 85 [Urine:75; Chest Tube:10]  Lab Results:  CBC: Recent Labs  10/28/16 0459 10/29/16 0441  WBC 15.9* 16.0*  HGB 9.9* 10.0*  HCT 28.9* 29.5*  PLT 173 208    BMET:  Recent Labs  10/28/16 0459 10/29/16 0441  NA 134* 137  K 4.4 3.8  CL 106 106  CO2 22 25  GLUCOSE 70 39*  BUN 28* 33*  CREATININE 1.77* 1.89*  CALCIUM 8.3* 8.3*     PT/INR:   Recent Labs  10/26/16 1657  LABPROT 18.0*  INR 1.47    CBG (last 3)   Recent Labs  10/29/16 0522 10/29/16 0751 10/29/16 0816  GLUCAP 172* 68 103*    ABG    Component Value Date/Time   PHART 7.377 10/27/2016 0540   PCO2ART 36.8 10/27/2016 0540   PO2ART 109.0 (H) 10/27/2016 0540   HCO3 21.8 10/27/2016 0540   TCO2 21 10/27/2016 1626   ACIDBASEDEF 3.0 (H) 10/27/2016 0540   O2SAT 98.0 10/27/2016 0540    CXR: PORTABLE CHEST 1 VIEW  COMPARISON:  10/28/2016 and previous  FINDINGS: Bilateral chest tubes remain in place. Right internal jugular venous access sheath remains in place. No pneumothorax on either side. Mild basilar atelectasis left more than right as seen previously. No new finding.  IMPRESSION: No change.  No pneumothorax.   Electronically Signed   By: Nelson Chimes M.D.   On: 10/29/2016 07:28  Assessment/Plan: S/P Procedure(s) (LRB): CORONARY ARTERY BYPASS GRAFTING (CABG) x 4 USING LEFT INTERNAL MAMMARY ARTERY TO LAD AND ENDOSCOPICALLY HARVESTED GREATER SAPHENOUS VEIN TO OM 1, 2 AND TO DIAG (N/A) TRANSESOPHAGEAL ECHOCARDIOGRAM (TEE) (N/A) ENDOVEIN HARVEST OF GREATER SAPHENOUS VEIN (Right)  Overall stable Maintaining NSR w/ stable BP Diuresed fairly well yesterday but BUN/Cr up slightly - may need to back off on diuretics Chest tube output decreased CBG's relatively low   Wean dopamine off  Stop lasix drip and change to intermittent dosing per Nephrology team  D/C chest tubes  D/C foley  Mobilize  Hold Norvasc for SBP < 120  Stop levemir insulin and change CBG's to ac/hs  Rexene Alberts, MD 10/29/2016 9:12 AM

## 2016-10-29 NOTE — Progress Notes (Signed)
Patient ID: Shawn Collins, male   DOB: Oct 12, 1950, 66 y.o.   MRN: [id number]  [facility] Progress Note   Assessment/ Plan:   1. 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 Creatinine appears to be relatively stable around his baseline with slow rise noted post CABG-continue to augment urine output with furosemide. He remains on dopamine low dose for renal perfusion. 2. Coronary artery disease/NSTEMI now status post CABG: POD #2 s/p four-vessel CABG using LIMA to LAD, greater saphenous to OM1, OM 2 and diagonal. Chest tube output noted to be decreasing overnight-further management per TCTS. 3. Hypertension: Blood pressure is currently on the soft side on metoprolol/furosemide-supported with dopamine. 4. Anemia: Anemia of chronic kidney disease/chronic illness-relatively stable post CABG. Reevaluate for Aranesp 5. Secondary hyperparathyroidism: PTH level within acceptable range for chronic kidney disease stage IV, calcium and phosphorus within goal.  Subjective:   Denies any acute events overnight-minimal chest pain and denies any shortness of breath.   Objective:   BP 126/79   Pulse 63   Temp 98.2 F (36.8 C) (Oral)   Resp 12   Ht 5\' 10"  (1.778 m)   Wt 75.7 kg (166 lb 14.2 oz)   SpO2 99%   BMI 23.95 kg/m   Intake/Output Summary (Last 24 hours) at 10/29/16 0819 Last data filed at 10/29/16 0800  Gross per 24 hour  Intake            654.4 ml  Output             1985 ml  Net          -1330.6 ml   Weight change: 0.8 kg (1 lb 12.2 oz)  Physical Exam: [id number] to be resting comfortably in recliner CVS: Pulse regular rhythm, normal rate, S1 and S2 normal. Paramedian chest dressing Resp: Diminished breath sounds over bases-bilateral chest tubes Abd: Soft, flat, nontender Ext: 2+ edema over lower  extremities  Imaging: Dg Chest Port 1 View  Result Date: 10/29/2016 CLINICAL DATA:  Follow-up bilateral chest tubes following CABG EXAM: PORTABLE CHEST 1 VIEW COMPARISON:  10/28/2016 and previous FINDINGS: Bilateral chest tubes remain in place. Right internal jugular venous access sheath remains in place. No pneumothorax on either side. Mild basilar atelectasis left more than right as seen previously. No new finding. IMPRESSION: No change.  No pneumothorax. Electronically Signed   By: Nelson Chimes M.D.   On: 10/29/2016 07:28   Dg Chest Port 1 View  Result Date: 10/28/2016 CLINICAL DATA:  Chest tube. EXAM: PORTABLE CHEST 1 VIEW COMPARISON:  10/27/2016. FINDINGS: Interim removal of endotracheal tube, NG tube, Swan-Ganz catheter, mediastinal drainage catheter. Right IJ sheath and bilateral chest tubes in stable position . Prior CABG. Heart size normal. Partial clearing of bibasilar pulmonary infiltrates/edema. Low lung volumes with bibasilar atelectasis. Small left pleural effusion. No pneumothorax. IMPRESSION: 1. Removal of endotracheal tube, NG tube, Swan-Ganz catheter, mediastinal drainage catheter. Right IJ sheath and bilateral chest tubes in stable position. 2. Interim improvement of bibasilar infiltrates/edema. Lung volumes with bibasilar atelectasis . Small left pleural effusion. 3.  Prior CABG.  Heart size stable. Electronically Signed   By: Marcello Moores  Register   On: 10/28/2016 08:00    Labs: BMET  Recent Labs Lab 10/23/16 8921 10/24/16 0430 10/25/16 1941 10/26/16 7408  10/26/16 1424 10/26/16 1528 10/26/16 1702 10/26/16 2245 10/26/16 2247 10/27/16 0329 10/27/16 1614 10/27/16 1626 10/28/16  0459 10/29/16 0441  NA 131* 130* 131* 131*  < > 135 132* 139  --  138 135  --  136 134* 137  K 3.7 3.9 4.3 4.1  < > 4.6 3.9 3.6  --  4.7 4.9  --  5.0 4.4 3.8  CL 105 103 102 104  < > 102 103  --   --  101 109  --  105 106 106  CO2 17* 18* 19* 21*  --   --   --   --   --   --  21*  --   --  22 25   GLUCOSE 197* 208* 181* 265*  < > 170* 171* 115*  --  141* 115*  --  112* 70 39*  BUN 53* 53* 43* 39*  < > 29* 27*  --   --  29* 25*  --  26* 28* 33*  CREATININE 2.14* 2.10* 1.99* 1.89*  < > 1.20 1.40*  --  1.45* 1.40* 1.46* 1.70* 1.70* 1.77* 1.89*  CALCIUM 8.0* 8.4* 8.4* 8.5*  --   --   --   --   --   --  7.8*  --   --  8.3* 8.3*  PHOS 2.9 2.7  --   --   --   --   --   --   --   --   --   --   --   --   --   < > = values in this interval not displayed. CBC  Recent Labs Lab 10/27/16 0329 10/27/16 1614 10/27/16 1626 10/28/16 0459 10/29/16 0441  WBC 14.9* 17.6*  --  15.9* 16.0*  HGB 10.0* 10.8* 9.9* 9.9* 10.0*  HCT 29.2* 30.6* 29.0* 28.9* 29.5*  MCV 84.9 86.0  --  86.5 87.3  PLT 162 174  --  173 208   Medications:    . acetaminophen  1,000 mg Oral Q6H   Or  . acetaminophen (TYLENOL) oral liquid 160 mg/5 mL  1,000 mg Per Tube Q6H  . amLODipine  10 mg Oral Daily  . aspirin EC  325 mg Oral Daily   Or  . aspirin  324 mg Per Tube Daily  . bisacodyl  10 mg Oral Daily   Or  . bisacodyl  10 mg Rectal Daily  . docusate sodium  200 mg Oral Daily  . enoxaparin (LOVENOX) injection  30 mg Subcutaneous QHS  . furosemide  40 mg Intravenous BID  . insulin aspart  0-24 Units Subcutaneous Q4H  . insulin detemir  15 Units Subcutaneous Daily  . metoprolol tartrate  12.5 mg Oral BID   Or  . metoprolol tartrate  12.5 mg Per Tube BID  . pantoprazole  40 mg Oral BID  . rosuvastatin  10 mg Oral q1800  . sodium chloride flush  3 mL Intravenous Q12H   Elmarie Shiley, MD 10/29/2016, 8:19 AM

## 2016-10-29 NOTE — Consult Note (Signed)
Hypoglycemic Event  CBG: 68  Treatment: d50 25cc  Symptoms: drowsy  Follow-up CBG: Time:0815 CBG Result:108  Possible Reasons for Event: poor po intake  Comments/MD notified: will notify with am rounds    Trana Ressler S

## 2016-10-30 ENCOUNTER — Inpatient Hospital Stay (HOSPITAL_COMMUNITY): Payer: Medicare Other

## 2016-10-30 LAB — PREALBUMIN: Prealbumin: 8.1 mg/dL — ABNORMAL LOW (ref 18–38)

## 2016-10-30 LAB — COMPREHENSIVE METABOLIC PANEL
ALBUMIN: 2.3 g/dL — AB (ref 3.5–5.0)
ALT: 14 U/L — ABNORMAL LOW (ref 17–63)
ANION GAP: 8 (ref 5–15)
AST: 20 U/L (ref 15–41)
Alkaline Phosphatase: 54 U/L (ref 38–126)
BUN: 33 mg/dL — AB (ref 6–20)
CHLORIDE: 100 mmol/L — AB (ref 101–111)
CO2: 25 mmol/L (ref 22–32)
Calcium: 8 mg/dL — ABNORMAL LOW (ref 8.9–10.3)
Creatinine, Ser: 2.18 mg/dL — ABNORMAL HIGH (ref 0.61–1.24)
GFR calc Af Amer: 34 mL/min — ABNORMAL LOW (ref >60)
GFR, EST NON AFRICAN AMERICAN: 30 mL/min — AB (ref >60)
Glucose, Bld: 182 mg/dL — ABNORMAL HIGH (ref 65–99)
Potassium: 3.9 mmol/L (ref 3.5–5.1)
Sodium: 133 mmol/L — ABNORMAL LOW (ref 135–145)
Total Bilirubin: 0.8 mg/dL (ref 0.3–1.2)
Total Protein: 4.7 g/dL — ABNORMAL LOW (ref 6.5–8.1)

## 2016-10-30 LAB — CBC
HEMATOCRIT: 26.7 % — AB (ref 39.0–52.0)
Hemoglobin: 8.9 g/dL — ABNORMAL LOW (ref 13.0–17.0)
MCH: 29.3 pg (ref 26.0–34.0)
MCHC: 33.3 g/dL (ref 30.0–36.0)
MCV: 87.8 fL (ref 78.0–100.0)
Platelets: 227 10*3/uL (ref 150–400)
RBC: 3.04 MIL/uL — ABNORMAL LOW (ref 4.22–5.81)
RDW: 13.7 % (ref 11.5–15.5)
WBC: 10.9 10*3/uL — AB (ref 4.0–10.5)

## 2016-10-30 LAB — GLUCOSE, CAPILLARY
GLUCOSE-CAPILLARY: 197 mg/dL — AB (ref 65–99)
Glucose-Capillary: 160 mg/dL — ABNORMAL HIGH (ref 65–99)
Glucose-Capillary: 240 mg/dL — ABNORMAL HIGH (ref 65–99)

## 2016-10-30 MED ORDER — DARBEPOETIN ALFA 60 MCG/0.3ML IJ SOSY
60.0000 ug | PREFILLED_SYRINGE | INTRAMUSCULAR | Status: DC
  Filled 2016-10-30 (×2): qty 0.3

## 2016-10-30 MED ORDER — FUROSEMIDE 40 MG PO TABS
40.0000 mg | ORAL_TABLET | Freq: Every day | ORAL | Status: DC
  Administered 2016-10-31: 40 mg via ORAL
  Filled 2016-10-30: qty 1

## 2016-10-30 NOTE — Progress Notes (Signed)
[address]       [place] [address]             [phone]        CARDIOTHORACIC SURGERY PROGRESS NOTE   R4 Days Post-Op Procedure(s) (LRB): CORONARY ARTERY BYPASS GRAFTING (CABG) x 4 USING LEFT INTERNAL MAMMARY ARTERY TO LAD AND ENDOSCOPICALLY HARVESTED GREATER SAPHENOUS VEIN TO OM 1, 2 AND TO DIAG (N/A) TRANSESOPHAGEAL ECHOCARDIOGRAM (TEE) (N/A) ENDOVEIN HARVEST OF GREATER SAPHENOUS VEIN (Right)  Subjective: Looks good and feels well.  Just ate breakfast.  No SOB.  No pain.  Ambulating well.  Objective: Vital signs: BP Readings from Last 1 Encounters:  10/30/16 134/64   Pulse Readings from Last 1 Encounters:  10/30/16 73   Resp Readings from Last 1 Encounters:  10/30/16 14   Temp Readings from Last 1 Encounters:  10/30/16 97.8 F (36.6 C) (Oral)    Hemodynamics:    Physical Exam:  Rhythm:   sinus  Breath sounds: clear  Heart sounds:  RRR  Incisions:  Clean and dry  Abdomen:  Soft, non-distended, non-tender  Extremities:  Warm, well-perfused, LE edema improved   Intake/Output from previous day: 06/02 0701 - 06/03 0700 In: 440.6 [P.O.:240; I.V.:100.6; IV Piggyback:100] Out: 1935 [Urine:1825; Chest Tube:110] Intake/Output this shift: No intake/output data recorded.  Lab Results:  CBC: Recent Labs  10/29/16 0441 10/30/16 0419  WBC 16.0* 10.9*  HGB 10.0* 8.9*  HCT 29.5* 26.7*  PLT 208 227    BMET:  Recent Labs  10/29/16 0441 10/30/16 0419  NA 137 133*  K 3.8 3.9  CL 106 100*  CO2 25 25  GLUCOSE 39* 182*  BUN 33* 33*  CREATININE 1.89* 2.18*  CALCIUM 8.3* 8.0*     PT/INR:  No results for input(s): LABPROT, INR in the last 72 hours.  CBG (last 3)   Recent Labs  10/29/16 1611 10/29/16 2124 10/30/16 0806  GLUCAP 251* 189* 160*    ABG    Component Value Date/Time   PHART 7.377 10/27/2016 0540   PCO2ART 36.8 10/27/2016 0540   PO2ART 109.0 (H) 10/27/2016 0540   HCO3 21.8 10/27/2016 0540   TCO2 21  10/27/2016 1626   ACIDBASEDEF 3.0 (H) 10/27/2016 0540   O2SAT 98.0 10/27/2016 0540    CXR: PORTABLE CHEST 1 VIEW  COMPARISON:  10/29/2016  FINDINGS: Bilateral chest tubes of been removed. Miniscule amount of pleural air at each apex. Right internal jugular venous access sheath remains in place. Mild lower lobe atelectasis persists.  IMPRESSION: Bilateral chest tubes removed. Miniscule amount of pleural air at each apex. No significant pneumothorax. Basilar atelectasis persists.   Electronically Signed   By: Nelson Chimes M.D.   On: 10/30/2016 08:01  Assessment/Plan: S/P Procedure(s) (LRB): CORONARY ARTERY BYPASS GRAFTING (CABG) x 4 USING LEFT INTERNAL MAMMARY ARTERY TO LAD AND ENDOSCOPICALLY HARVESTED GREATER SAPHENOUS VEIN TO OM 1, 2 AND TO DIAG (N/A) TRANSESOPHAGEAL ECHOCARDIOGRAM (TEE) (N/A) ENDOVEIN HARVEST OF GREATER SAPHENOUS VEIN (Right)  Overall doing well POD4 Maintaining NSR w/ stable BP Breathing comfortably w/ O2 sats 98-100% on RA Hypertension under adequate control, allowing slightly higher BP for renal perfusion ATN +/- prerenal azotemia on chronic kidney disease w/ slight increase creatinine today, BUN stable - good UOP w/ I/O's 1.5 liters negative yesterday and weight down 7 lbs Type II diabetes mellitus, adequate glycemic control COPD   Consider stopping IV lasix and changing to oral - defer to Nephrology team  Follow creatinine, electrolytes  Mobilize  Continue CBG's and SSI  Transfer step down   Rexene Alberts, MD 10/30/2016 9:34 AM

## 2016-10-30 NOTE — Progress Notes (Signed)
Patient arrived to 2W room 24.  Telemetry monitor applied and CCMD notified.  Patient oriented to unit and room to include call light and phone.  Will continue to monitor.

## 2016-10-30 NOTE — Progress Notes (Signed)
Patient ID: Shawn Collins, male   DOB: Dec 04, 1950, 66 y.o.   MRN: [id number]  [facility] Progress Note   Assessment/ Plan:   1. 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 Creatinine slightly higher with net negative fluid balance from ongoing diuretic management. Switch to oral furosemide beginning tomorrow (today getting tandem albumin/Lasix). Off dopamine drip. 2. Coronary artery disease/NSTEMI now status post CABG: POD #3 s/p four-vessel CABG using LIMA to LAD, greater saphenous to OM1, OM 2 and diagonal. Chest tubes removed yesterday. 3. Hypertension: Blood pressure currently within acceptable limits-off dopamine. 4. Anemia: Anemia of chronic kidney disease/chronic illness-relatively stable post CABG. Will prescribe Aranesp 5. Secondary hyperparathyroidism: PTH level within acceptable range for chronic kidney disease stage IV, calcium and phosphorus within goal.  Subjective:   Denies any acute events overnight-feels much more comfortable after removal of chest tubes and looking forward to ambulate today.  Objective:   BP 134/64 (BP Location: Right Arm)   Pulse 73   Temp 97.8 F (36.6 C) (Oral)   Resp 14   Ht 5\' 10"  (1.778 m)   Wt 72.2 kg (159 lb 2.8 oz)   SpO2 99%   BMI 22.84 kg/m   Intake/Output Summary (Last 24 hours) at 10/30/16 2376 Last data filed at 10/30/16 0800  Gross per 24 hour  Intake              415 ml  Output             1850 ml  Net            -1435 ml   Weight change: -3.5 kg (-7 lb 11.5 oz)  Physical Exam: Gen: Sitting up comfortably in recliner eating breakfast CVS: Pulse regular rhythm, normal rate, S1 and S2 normal.  Resp: Poor inspiratory effort with reduced breath sounds over bases, no rales Abd: Soft, flat, nontender Ext: 1-2+ edema over legs  bilaterally  Imaging: Dg Chest Port 1 View  Result Date: 10/30/2016 CLINICAL DATA:  Follow-up chest tube removal EXAM: PORTABLE CHEST 1 VIEW COMPARISON:  10/29/2016 FINDINGS: Bilateral chest tubes of been removed. Miniscule amount of pleural air at each apex. Right internal jugular venous access sheath remains in place. Mild lower lobe atelectasis persists. IMPRESSION: Bilateral chest tubes removed. Miniscule amount of pleural air at each apex. No significant pneumothorax. Basilar atelectasis persists. Electronically Signed   By: Nelson Chimes M.D.   On: 10/30/2016 08:01   Dg Chest Port 1 View  Result Date: 10/29/2016 CLINICAL DATA:  Follow-up bilateral chest tubes following CABG EXAM: PORTABLE CHEST 1 VIEW COMPARISON:  10/28/2016 and previous FINDINGS: Bilateral chest tubes remain in place. Right internal jugular venous access sheath remains in place. No pneumothorax on either side. Mild basilar atelectasis left more than right as seen previously. No new finding. IMPRESSION: No change.  No pneumothorax. Electronically Signed   By: Nelson Chimes M.D.   On: 10/29/2016 07:28    Labs: BMET  Recent Labs Lab 10/24/16 0430 10/25/16 2831 10/26/16 0454  10/26/16 1528 10/26/16 1702  10/26/16 2247 10/27/16 0329 10/27/16 1614 10/27/16 1626 10/28/16 0459 10/29/16 0441 10/30/16 0419  NA 130* 131* 131*  < > 132* 139  --  138 135  --  136 134* 137 133*  K 3.9 4.3 4.1  < > 3.9 3.6  --  4.7 4.9  --  5.0 4.4 3.8 3.9  CL 103 102 104  < > 103  --   --  101 109  --  105 106 106 100*  CO2 18* 19* 21*  --   --   --   --   --  21*  --   --  22 25 25   GLUCOSE 208* 181* 265*  < > 171* 115*  --  141* 115*  --  112* 70 39* 182*  BUN 53* 43* 39*  < > 27*  --   --  29* 25*  --  26* 28* 33* 33*  CREATININE 2.10* 1.99* 1.89*  < > 1.40*  --   < > 1.40* 1.46* 1.70* 1.70* 1.77* 1.89* 2.18*  CALCIUM 8.4* 8.4* 8.5*  --   --   --   --   --  7.8*  --   --  8.3* 8.3* 8.0*  PHOS 2.7  --   --   --   --   --   --   --   --    --   --   --   --   --   < > = values in this interval not displayed. CBC  Recent Labs Lab 10/27/16 1614 10/27/16 1626 10/28/16 0459 10/29/16 0441 10/30/16 0419  WBC 17.6*  --  15.9* 16.0* 10.9*  HGB 10.8* 9.9* 9.9* 10.0* 8.9*  HCT 30.6* 29.0* 28.9* 29.5* 26.7*  MCV 86.0  --  86.5 87.3 87.8  PLT 174  --  173 208 227   Medications:    . acetaminophen  1,000 mg Oral Q6H  . amLODipine  10 mg Oral Daily  . aspirin EC  325 mg Oral Daily  . bisacodyl  10 mg Oral Daily   Or  . bisacodyl  10 mg Rectal Daily  . docusate sodium  200 mg Oral Daily  . enoxaparin (LOVENOX) injection  30 mg Subcutaneous QHS  . [START ON 10/31/2016] furosemide  40 mg Oral Daily  . insulin aspart  0-9 Units Subcutaneous TID WC  . metoprolol tartrate  12.5 mg Oral BID  . pantoprazole  40 mg Oral BID  . rosuvastatin  10 mg Oral q1800  . sodium chloride flush  3 mL Intravenous Q12H   Elmarie Shiley, MD 10/30/2016, 9:06 AM

## 2016-10-31 LAB — COMPREHENSIVE METABOLIC PANEL
ALBUMIN: 2.7 g/dL — AB (ref 3.5–5.0)
ALK PHOS: 72 U/L (ref 38–126)
ALT: 15 U/L — AB (ref 17–63)
ANION GAP: 7 (ref 5–15)
AST: 22 U/L (ref 15–41)
BUN: 34 mg/dL — ABNORMAL HIGH (ref 6–20)
CALCIUM: 8.3 mg/dL — AB (ref 8.9–10.3)
CHLORIDE: 101 mmol/L (ref 101–111)
CO2: 25 mmol/L (ref 22–32)
Creatinine, Ser: 1.93 mg/dL — ABNORMAL HIGH (ref 0.61–1.24)
GFR calc non Af Amer: 34 mL/min — ABNORMAL LOW (ref >60)
GFR, EST AFRICAN AMERICAN: 40 mL/min — AB (ref >60)
GLUCOSE: 216 mg/dL — AB (ref 65–99)
Potassium: 4.1 mmol/L (ref 3.5–5.1)
SODIUM: 133 mmol/L — AB (ref 135–145)
Total Bilirubin: 0.7 mg/dL (ref 0.3–1.2)
Total Protein: 5.2 g/dL — ABNORMAL LOW (ref 6.5–8.1)

## 2016-10-31 LAB — CBC
HCT: 27.8 % — ABNORMAL LOW (ref 39.0–52.0)
HEMOGLOBIN: 9 g/dL — AB (ref 13.0–17.0)
MCH: 28.8 pg (ref 26.0–34.0)
MCHC: 32.4 g/dL (ref 30.0–36.0)
MCV: 88.8 fL (ref 78.0–100.0)
PLATELETS: 269 10*3/uL (ref 150–400)
RBC: 3.13 MIL/uL — AB (ref 4.22–5.81)
RDW: 13.6 % (ref 11.5–15.5)
WBC: 9.1 10*3/uL (ref 4.0–10.5)

## 2016-10-31 LAB — GLUCOSE, CAPILLARY
GLUCOSE-CAPILLARY: 258 mg/dL — AB (ref 65–99)
GLUCOSE-CAPILLARY: 326 mg/dL — AB (ref 65–99)
GLUCOSE-CAPILLARY: 238 mg/dL — AB (ref 65–99)
Glucose-Capillary: 36 mg/dL — CL (ref 65–99)
Glucose-Capillary: 292 mg/dL — ABNORMAL HIGH (ref 65–99)
Glucose-Capillary: 197 mg/dL — ABNORMAL HIGH (ref 65–99)

## 2016-10-31 MED ORDER — DARBEPOETIN ALFA 60 MCG/0.3ML IJ SOSY
60.0000 ug | PREFILLED_SYRINGE | INTRAMUSCULAR | Status: DC
  Administered 2016-10-31: 60 ug via SUBCUTANEOUS
  Filled 2016-10-31 (×2): qty 0.3

## 2016-10-31 MED ORDER — SODIUM CHLORIDE 0.9 % IV SOLN
510.0000 mg | Freq: Once | INTRAVENOUS | Status: AC
  Administered 2016-10-31: 510 mg via INTRAVENOUS
  Filled 2016-10-31: qty 17

## 2016-10-31 NOTE — Addendum Note (Signed)
Addendum  created 10/31/16 1404 by Myrtie Soman, MD   Sign clinical note

## 2016-10-31 NOTE — Progress Notes (Signed)
Progress Note  Patient Name: Deroy Noah Date of Encounter: 10/31/2016  Primary Cardiologist: Radford Pax  Subjective   No chest pain dyspnea or palpitations   Inpatient Medications    Scheduled Meds: . acetaminophen  1,000 mg Oral Q6H  . amLODipine  10 mg Oral Daily  . aspirin EC  325 mg Oral Daily  . bisacodyl  10 mg Oral Daily   Or  . bisacodyl  10 mg Rectal Daily  . darbepoetin (ARANESP) injection - NON-DIALYSIS  60 mcg Subcutaneous Q Sun-1800  . docusate sodium  200 mg Oral Daily  . enoxaparin (LOVENOX) injection  30 mg Subcutaneous QHS  . furosemide  40 mg Oral Daily  . insulin aspart  0-9 Units Subcutaneous TID WC  . metoprolol tartrate  12.5 mg Oral BID  . pantoprazole  40 mg Oral BID  . rosuvastatin  10 mg Oral q1800  . sodium chloride flush  3 mL Intravenous Q12H   Continuous Infusions: . sodium chloride     PRN Meds: metoprolol tartrate, ondansetron (ZOFRAN) IV, oxyCODONE, sodium chloride flush, traMADol   Vital Signs    Vitals:   10/30/16 1400 10/30/16 1452 10/30/16 2225 10/31/16 0353  BP: (!) 136/94 128/66 130/60 132/63  Pulse: 79 78 77 69  Resp: 17 20 18 18   Temp:  98 F (36.7 C) 98.9 F (37.2 C) 98.6 F (37 C)  TempSrc:  Oral Oral Oral  SpO2: 100% 99% 96% 93%  Weight:    165 lb 4.8 oz (75 kg)  Height:        Intake/Output Summary (Last 24 hours) at 10/31/16 0828 Last data filed at 10/31/16 0825  Gross per 24 hour  Intake              720 ml  Output              700 ml  Net               20 ml   Filed Weights   10/29/16 0500 10/30/16 0600 10/31/16 0353  Weight: 166 lb 14.2 oz (75.7 kg) 159 lb 2.8 oz (72.2 kg) 165 lb 4.8 oz (75 kg)    Telemetry    NSR 10/31/2016  - Personally Reviewed  ECG    NSR low voltage   - Personally Reviewed  Physical Exam   GEN: No acute distress.   Neck: No JVD Cardiac: RRR, no murmurs, rubs, or gallops. Post sternotomy  Respiratory: Clear to auscultation bilaterally. GI: Soft, nontender, non-distended    MS: No edema; No deformity. Neuro:  Nonfocal  Psych: Normal affect   Labs    Chemistry Recent Labs Lab 10/25/16 0851  10/29/16 0441 10/30/16 0419 10/31/16 0308  NA 131*  < > 137 133* 133*  K 4.3  < > 3.8 3.9 4.1  CL 102  < > 106 100* 101  CO2 19*  < > 25 25 25   GLUCOSE 181*  < > 39* 182* 216*  BUN 43*  < > 33* 33* 34*  CREATININE 1.99*  < > 1.89* 2.18* 1.93*  CALCIUM 8.4*  < > 8.3* 8.0* 8.3*  PROT 5.8*  --   --  4.7* 5.2*  ALBUMIN 2.6*  --   --  2.3* 2.7*  AST 23  --   --  20 22  ALT 19  --   --  14* 15*  ALKPHOS 42  --   --  54 72  BILITOT 0.5  --   --  0.8 0.7  GFRNONAA 33*  < > 35* 30* 34*  GFRAA 38*  < > 41* 34* 40*  ANIONGAP 10  < > 6 8 7   < > = values in this interval not displayed.   Hematology Recent Labs Lab 10/29/16 0441 10/30/16 0419 10/31/16 0308  WBC 16.0* 10.9* 9.1  RBC 3.38* 3.04* 3.13*  HGB 10.0* 8.9* 9.0*  HCT 29.5* 26.7* 27.8*  MCV 87.3 87.8 88.8  MCH 29.6 29.3 28.8  MCHC 33.9 33.3 32.4  RDW 13.6 13.7 13.6  PLT 208 227 269    Cardiac EnzymesNo results for input(s): TROPONINI in the last 168 hours. No results for input(s): TROPIPOC in the last 168 hours.   BNPNo results for input(s): BNP, PROBNP in the last 168 hours.   DDimer No results for input(s): DDIMER in the last 168 hours.   Radiology    Dg Chest Port 1 View  Result Date: 10/30/2016 CLINICAL DATA:  Follow-up chest tube removal EXAM: PORTABLE CHEST 1 VIEW COMPARISON:  10/29/2016 FINDINGS: Bilateral chest tubes of been removed. Miniscule amount of pleural air at each apex. Right internal jugular venous access sheath remains in place. Mild lower lobe atelectasis persists. IMPRESSION: Bilateral chest tubes removed. Miniscule amount of pleural air at each apex. No significant pneumothorax. Basilar atelectasis persists. Electronically Signed   By: Nelson Chimes M.D.   On: 10/30/2016 08:01    Cardiac Studies  Cath:  Reviewed films  Conclusion     Mid RCA lesion, 40  %stenosed.  2nd RPLB lesion, 90 %stenosed.  Ost LAD to Prox LAD lesion, 95 %stenosed.  1st Diag lesion, 85 %stenosed.  Ost Cx to Prox Cx lesion, 70 %stenosed.  Ost 1st Mrg lesion, 90 %stenosed.  LV end diastolic pressure is normal.   1. Severe 3 vessel obstructive CAD. Co-dominant circulation    - complex critical bifurcation LAD/first diagonal stenosis Medina classification 1,1,0    - Bifurcation proximal LCx/OM1 stenosis.    - 90% small PL branch 2. Normal LV function  Plan: Given complex multivessel disease in a diabetic would recommend CT surgery consult.    Echo : 10/16/16  Study Conclusions  - Left ventricle: The cavity size was normal. Wall thickness was   normal. Systolic function was normal. The estimated ejection   fraction was in the range of 60% to 65%. Wall motion was normal;   there were no regional wall motion abnormalities. Doppler   parameters are consistent with abnormal left ventricular   relaxation (grade 1 diastolic dysfunction). - Mitral valve: There was mild regurgitation. - Pericardium   Patient Profile     66 y.o. male with chest pain Diabetic Cath with normal EF and 3VD  10/26/16 had CABG with Dr Servando Snare LIMA to LAD SVG OM1/OM2 and SVG D1  Assessment & Plan    1) CABG:  Wounds healing well. Tiny pleural air apex on CXR CT;s out Pain control good Continue asa and beta blocker  For SNF 2) CRF:  Cr improved post CABG 1.93 f/u Dr Posey Pronto 3) HTN:  Well controlled.  Continue current medications and low sodium Dash type diet.     Outpatient cardiology f/u with Dr Radford Pax  Signed, Jenkins Rouge, MD  10/31/2016, 8:28 AM

## 2016-10-31 NOTE — Discharge Instructions (Signed)
Coronary Artery Bypass Grafting, Care After This sheet gives you information about how to care for yourself after your procedure. Your health care provider may also give you more specific instructions. If you have problems or questions, contact your health care provider. What can I expect after the procedure? After the procedure, it is common to have:  Nausea and a lack of appetite.  Constipation.  Weakness and fatigue.  Depression or irritability.  Pain or discomfort in your incision areas.  Follow these instructions at home: Medicines  Take over-the-counter and prescription medicines only as told by your health care provider. Do not stop taking medicines or start any new medicines without approval from your health care provider.  If you were prescribed an antibiotic medicine, take it as told by your health care provider. Do not stop taking the antibiotic even if you start to feel better.  Do not drive or use heavy machinery while taking prescription pain medicine. Incision care  Follow instructions from your health care provider about how to take care of your incisions. Make sure you: ? Wash your hands with soap and water before you change your bandage (dressing). If soap and water are not available, use hand sanitizer. ? Change your dressing as told by your health care provider. ? Leave stitches (sutures), skin glue, or adhesive strips in place. These skin closures may need to stay in place for 2 weeks or longer. If adhesive strip edges start to loosen and curl up, you may trim the loose edges. Do not remove adhesive strips completely unless your health care provider tells you to do that.  Keep incision areas clean, dry, and protected.  Check your incision areas every day for signs of infection. Check for: ? More redness, swelling, or pain. ? More fluid or blood. ? Warmth. ? Pus or a bad smell.  If incisions were made in your legs: ? Avoid crossing your legs. ? Avoid  sitting for long periods of time. Change positions every 30 minutes. ? Raise (elevate) your legs when you are sitting. Bathing  Do not take baths, swim, or use a hot tub until your health care provider approves.  Only take sponge baths. Pat the incisions dry. Do not rub incisions with a washcloth or towel.  Ask your health care provider when you can shower. Eating and drinking  Eat foods that are high in fiber, such as raw fruits and vegetables, whole grains, beans, and nuts. Meats should be lean cut. Avoid canned, processed, and fried foods. This can help prevent constipation and is a recommended part of a heart-healthy diet.  Drink enough fluid to keep your urine clear or pale yellow.  Limit alcohol intake to no more than 1 drink a day for nonpregnant women and 2 drinks a day for men. One drink equals 12 oz of beer, 5 oz of wine, or 1 oz of hard liquor. Activity  Rest and limit your activity as told by your health care provider. You may be instructed to: ? Stop any activity right away if you have chest pain, shortness of breath, irregular heartbeats, or dizziness. Get help right away if you have any of these symptoms. ? Move around frequently for short periods or take short walks as directed by your health care provider. Gradually increase your activities. You may need physical therapy or cardiac rehabilitation to help strengthen your muscles and build your endurance. ? Avoid lifting, pushing, or pulling anything that is heavier than 10 lb (4.5 kg) for at  least 6 weeks or as told by your health care provider.  Do not drive until your health care provider approves.  Ask your health care provider when you may return to work.  Ask your health care provider when you may resume sexual activity. General instructions  Do not use any products that contain nicotine or tobacco, such as cigarettes and e-cigarettes. If you need help quitting, ask your health care provider.  Take 2-3 deep  breaths every few hours during the day, while you recover. This helps expand your lungs and prevent complications like pneumonia after surgery.  If you were given a device called an incentive spirometer, use it several times a day to practice deep breathing. Support your chest with a pillow or your arms when you take deep breaths or cough.  Wear compression stockings as told by your health care provider. These stockings help to prevent blood clots and reduce swelling in your legs.  Weigh yourself every day. This helps identify if your body is holding (retaining) fluid that may make your heart and lungs work harder.  Keep all follow-up visits as told by your health care provider. This is important. Contact a health care provider if:  You have more redness, swelling, or pain around any incision.  You have more fluid or blood coming from any incision.  Any incision feels warm to the touch.  You have pus or a bad smell coming from any incision  You have a fever.  You have swelling in your ankles or legs.  You have pain in your legs.  You gain 2 lb (0.9 kg) or more a day.  You are nauseous or you vomit.  You have diarrhea. Get help right away if:  You have chest pain that spreads to your jaw or arms.  You are short of breath.  You have a fast or irregular heartbeat.  You notice a "clicking" in your breastbone (sternum) when you move.  You have numbness or weakness in your arms or legs.  You feel dizzy or light-headed. Summary  After the procedure, it is common to have pain or discomfort in the incision areas.  Do not take baths, swim, or use a hot tub until your health care provider approves.  Gradually increase your activities. You may need physical therapy or cardiac rehabilitation to help strengthen your muscles and build your endurance.  Weigh yourself every day. This helps identify if your body is holding (retaining) fluid that may make your heart and lungs work  harder. This information is not intended to replace advice given to you by your health care provider. Make sure you discuss any questions you have with your health care provider. Document Released: 12/03/2004 Document Revised: 04/04/2016 Document Reviewed: 04/04/2016 Elsevier Interactive Patient Education  2018 [employer].   Endoscopic Saphenous Vein Harvesting, Care After Refer to this sheet in the next few weeks. These instructions provide you with information about caring for yourself after your procedure. Your health care provider may also give you more specific instructions. Your treatment has been planned according to current medical practices, but problems sometimes occur. Call your health care provider if you have any problems or questions after your procedure. What can I expect after the procedure? After the procedure, it is common to have:  Pain.  Bruising.  Swelling.  Numbness.  Follow these instructions at home: Medicine  Take over-the-counter and prescription medicines only as told by your health care provider.  Do not drive or operate heavy machinery  while taking prescription pain medicine. Incision care   Follow instructions from your health care provider about how to take care of the cut made during surgery (incision). Make sure you: ? Wash your hands with soap and water before you change your bandage (dressing). If soap and water are not available, use hand sanitizer. ? Change your dressing as told by your health care provider. ? Leave stitches (sutures), skin glue, or adhesive strips in place. These skin closures may need to be in place for 2 weeks or longer. If adhesive strip edges start to loosen and curl up, you may trim the loose edges. Do not remove adhesive strips completely unless your health care provider tells you to do that.  Check your incision area every day for signs of infection. Check for: ? More redness, swelling, or pain. ? More fluid or  blood. ? Warmth. ? Pus or a bad smell. General instructions  Raise (elevate) your legs above the level of your heart while you are sitting or lying down.  Do any exercises your health care providers have given you. These may include deep breathing, coughing, and walking exercises.  Do not shower, take baths, swim, or use a hot tub unless told by your health care provider.  Wear your elastic stocking if told by your health care provider.  Keep all follow-up visits as told by your health care provider. This is important. Contact a health care provider if:  Medicine does not help your pain.  Your pain gets worse.  You have new leg bruises or your leg bruises get bigger.  You have a fever.  Your leg feels numb.  You have more redness, swelling, or pain around your incision.  You have more fluid or blood coming from your incision.  Your incision feels warm to the touch.  You have pus or a bad smell coming from your incision. Get help right away if:  Your pain is severe.  You develop pain, tenderness, warmth, redness, or swelling in any part of your leg.  You have chest pain.  You have trouble breathing. This information is not intended to replace advice given to you by your health care provider. Make sure you discuss any questions you have with your health care provider. Document Released: 01/26/2011 Document Revised: 10/22/2015 Document Reviewed: 03/30/2015 Elsevier Interactive Patient Education  2018 [employer].

## 2016-10-31 NOTE — Discharge Summary (Signed)
Physician Discharge Summary  Patient ID: Jess Toney MRN: [id number] DOB/AGE: 11-21-1950 66 y.o.  Admit date: 10/15/2016 Discharge date: 11/04/2016  Admission Diagnoses:  Patient Active Problem List   Diagnosis Date Noted  . Coronary artery disease involving native coronary artery of native heart with unstable angina pectoris ([place])   . Acute kidney injury superimposed on CKD ([place])   . Abnormal nuclear stress test   . Nausea & vomiting 10/16/2016  . NSTEMI (non-ST elevated myocardial infarction) ([place]) 10/16/2016  . CKD (chronic kidney disease), stage III 10/16/2016  . Hypertensive emergency 10/16/2016  . Elevated troponin   . COPD exacerbation ([place]) 09/10/2014  . COPD (chronic obstructive pulmonary disease) ([place]) 09/10/2014  . Viral gastroenteritis 09/10/2014  . Hypoxia 09/10/2014  . Dizzy-->?Orthostatic? 09/10/2014  . Diabetes mellitus without complication ([place]) 45/80/9983  . Hypertension 09/10/2014  . Emphysema lung ([place]) 09/10/2014   Discharge Diagnoses:   Patient Active Problem List   Diagnosis Date Noted  . S/P CABG x 4 10/26/2016  . Coronary artery disease involving native coronary artery of native heart with unstable angina pectoris ([place])   . Acute kidney injury superimposed on CKD ([place])   . Abnormal nuclear stress test   . Nausea & vomiting 10/16/2016  . NSTEMI (non-ST elevated myocardial infarction) ([place]) 10/16/2016  . CKD (chronic kidney disease), stage III 10/16/2016  . Hypertensive emergency 10/16/2016  . Elevated troponin   . COPD exacerbation ([place]) 09/10/2014  . COPD (chronic obstructive pulmonary disease) ([place]) 09/10/2014  . Viral gastroenteritis 09/10/2014  . Hypoxia 09/10/2014  . Dizzy-->?Orthostatic? 09/10/2014  . Diabetes mellitus without complication ([place]) 38/25/0539  . Hypertension 09/10/2014  . Emphysema lung ([place]) 09/10/2014   Discharged Condition: good  History of Present Illness:  Mr. Payne is a 66 yo AA male with history of DM,  Hyperlipidemia, Hypertension, and COPD.  He presented to the ED on 10/16/2016 with complaints of 2 days of malaise, fatigue and persistent nausea and vomiting.  Workup in ED showed EKG changes concerning for ischemia.  His troponin levels were also elevated.  He was admitted by Cardiology for further workup.   Hospital Course:    The patient was treated for viral gastroenteritis.  Myoview stress test was performed and showed areas suggestive of ischemia.  Echocardiogram was also obtained and showed normal LV function.  He was taken for catheterization on 10/21/2016 which showed the patient to have multivessel CAD.  It was felt coronary bypass grafting would be indicated and TCTS consult was obtained.  He was evaluated by Dr. Servando Snare who was in agreement the patient would benefit from bypass surgery.  The risks and benefits of the procedure were explained to the patient and he was agreeable to proceed.  The patient has a history of CKD stage III and Nephrology consult was obtained prior to proceeding with surgery. He underwent medical tune up prior to proceeding to the operating room. He was taken to the operating room on 10/26/2016 and underwent CABG x 4 utilizing LIMA to LAD, SVG to Diagonal, Sequential SVG to OM1 and OM2, evacuation of bilateral pleural effusions, and endoscopic harvest of right leg.  He tolerated the procedure without difficulty and was taken to the SICU in stable condition.  During his stay in the SICU the patient was weaned and extubated.  His mediastinal chest tubes.  He was hypertensive and required NTG drip, which was able to be weaned with addition of home Norvasc.  His urinary output decreased and he required addition of Dopamine drip,  dose of Albumin, and diuresis.  Once urinary output improved, he was weaned off Dopamine drip as tolerated.  His pleural chest tubes were removed on POD #3.  He continued to make progress and was felt medically stable for transfer to the step down unit on  POD #4.  The patient has been routinely followed by Nephrology throughout his hospital stay.  He was aggressively diuresed with IV Lasix.  His diuretic regimen was increased by nephrology on 06/05 and her creatinine increased to 2.34. His creatinine level then started trending down with most recent value at 2.06.  He was found to have bilateral pleural effusions. IR was consulted and 0.5 liters of serosanguinous fluid was removed. He is maintaining NSR and his pacing wires were removed without difficulty.  He is participating with PT and they recommend SNF placement as patient lives alone.  He is tolerating a diet and his pain is well controlled.  He is medically stable for discharge to SNF placement today.                Significant Diagnostic Studies: angiography:    Mid RCA lesion, 40 %stenosed.  2nd RPLB lesion, 90 %stenosed.  Ost LAD to Prox LAD lesion, 95 %stenosed.  1st Diag lesion, 85 %stenosed.  Ost Cx to Prox Cx lesion, 70 %stenosed.  Ost 1st Mrg lesion, 90 %stenosed.  LV end diastolic pressure is normal.   1. Severe 3 vessel obstructive CAD. Co-dominant circulation    - complex critical bifurcation LAD/first diagonal stenosis Medina classification 1,1,0    - Bifurcation proximal LCx/OM1 stenosis.    - 90% small PL branch 2. Normal LV function  Plan: Given complex multivessel disease in a diabetic would recommend CT surgery consult.   Treatments: surgery:   SURGICAL PROCEDURES:  Coronary artery bypass grafting x4 with left internal mammary to the left anterior descending coronary artery, reverse saphenous vein graft to the diagonal coronary artery, sequential reverse saphenous vein graft to the first and second obtuse marginal coronary artery, evacuation of bilateral pleural effusions, right greater saphenous vein endoscopic harvesting of the thigh and upper calf.  Disposition: 01-Home or Self Care   Discharge Medications:  The patient has been discharged  on:   1.Beta Blocker:  Yes [ x  ]                              No   [   ]                              If No, reason:  2.Ace Inhibitor/ARB: Yes [   ]                                     No  [x    ]                                     If No, reason: Elevated creatinine  3.Statin:   Yes [x   ]                  No  [   ]  If No, reason:  4.Ecasa:  Yes  [x   ]                  No   [   ]                  If No, reason:       Discharge Instructions    Amb Referral to Cardiac Rehabilitation    Complete by:  As directed    Diagnosis:   CABG NSTEMI     CABG X ___:  4     Allergies as of 11/04/2016      Reactions   No Known Allergies       Medication List    STOP taking these medications   diphenhydrAMINE 25 MG tablet Commonly known as:  BENADRYL   glimepiride 4 MG tablet Commonly known as:  AMARYL   hydrocortisone cream 1 %   lisinopril 20 MG tablet Commonly known as:  PRINIVIL,ZESTRIL   promethazine 12.5 MG tablet Commonly known as:  PHENERGAN     TAKE these medications   amLODipine 10 MG tablet Commonly known as:  NORVASC Take 1 tablet (10 mg total) by mouth daily.   aspirin 325 MG EC tablet Take 1 tablet (325 mg total) by mouth daily. Start taking on:  11/05/2016   furosemide 40 MG tablet Commonly known as:  LASIX Take 1 tablet (40 mg total) by mouth 2 (two) times daily.   metoprolol tartrate 25 MG tablet Commonly known as:  LOPRESSOR Take 0.5 tablets (12.5 mg total) by mouth 2 (two) times daily.   oxyCODONE 5 MG immediate release tablet Commonly known as:  Oxy IR/ROXICODONE Take 1 tablet (5 mg total) by mouth every 6 (six) hours as needed for severe pain.   rosuvastatin 10 MG tablet Commonly known as:  CRESTOR Take 1 tablet (10 mg total) by mouth daily at 6 PM.   TOUJEO SOLOSTAR 300 UNIT/ML Sopn Generic drug:  Insulin Glargine Inject 20 Units into the skin at bedtime.       Contact information for follow-up providers     Consuelo Pandy, PA-C Follow up on 11/03/2016.   Specialties:  Cardiology, Radiology Why:  Please arrive at 9:15 am for a 9:30 am appt. Contact information: [address] [place] [address] [phone]        Grace Isaac, MD Follow up.   Specialty:  Cardiothoracic Surgery Contact information: [address] [place] [address] [phone]            Contact information for after-discharge care    Destination    [facility] .   Specialty:  [place] information: [address] [phone]                 1. Please obtain vital signs at least one time daily 2.Please weigh the patient daily. If he or she continues to gain weight or develops lower extremity edema, contact the office at [phone]. 3. Ambulate patient at least three times daily and please use sternal precautions.  The patient has been discharged on:   1.Beta Blocker:  Yes [ y  ]                              No   [   ]  If No, reason:  2.Ace Inhibitor/ARB: Yes [   ]                                     No  [    ]                                     If No, reason:renal insuff 3.Statin:   Yes Blue.Reese   ]                  No  [   ]                  If No, reason:  4.Ecasa:  Yes  [ y  ]                  No   [   ]                  If No, reason:   Signed: Chuckie Mccathern E 11/04/2016, 12:10 PM

## 2016-10-31 NOTE — Progress Notes (Addendum)
      [address]       [place] [address]             [phone]      5 Days Post-Op Procedure(s) (LRB): CORONARY ARTERY BYPASS GRAFTING (CABG) x 4 USING LEFT INTERNAL MAMMARY ARTERY TO LAD AND ENDOSCOPICALLY HARVESTED GREATER SAPHENOUS VEIN TO OM 1, 2 AND TO DIAG (N/A) TRANSESOPHAGEAL ECHOCARDIOGRAM (TEE) (N/A) ENDOVEIN HARVEST OF GREATER SAPHENOUS VEIN (Right)   Subjective:  No complaints.  Denies chest pain and shortness of breath.  Patient lives alone, will need SNF at discharge.  + ambulation   + BM  Objective: Vital signs in last 24 hours: Temp:  [97.8 F (36.6 C)-98.9 F (37.2 C)] 98.6 F (37 C) (06/04 0353) Pulse Rate:  [67-133] 69 (06/04 0353) Cardiac Rhythm: Normal sinus rhythm (06/04 0700) Resp:  [14-23] 18 (06/04 0353) BP: (115-158)/(60-94) 132/63 (06/04 0353) SpO2:  [93 %-100 %] 93 % (06/04 0353) Weight:  [165 lb 4.8 oz (75 kg)] 165 lb 4.8 oz (75 kg) (06/04 0353)  Intake/Output from previous day: 06/03 0701 - 06/04 0700 In: 720 [P.O.:720] Out: 450 [Urine:450]  General appearance: alert, cooperative and no distress Heart: regular rate and rhythm Lungs: clear to auscultation bilaterally Abdomen: soft, non-tender; bowel sounds normal; no masses,  no organomegaly Extremities: edema 1-2+ pitting  Wound: clean and dry  Lab Results:  Recent Labs  10/30/16 0419 10/31/16 0308  WBC 10.9* 9.1  HGB 8.9* 9.0*  HCT 26.7* 27.8*  PLT 227 269   BMET:  Recent Labs  10/30/16 0419 10/31/16 0308  NA 133* 133*  K 3.9 4.1  CL 100* 101  CO2 25 25  GLUCOSE 182* 216*  BUN 33* 34*  CREATININE 2.18* 1.93*  CALCIUM 8.0* 8.3*    PT/INR: No results for input(s): LABPROT, INR in the last 72 hours. ABG    Component Value Date/Time   PHART 7.377 10/27/2016 0540   HCO3 21.8 10/27/2016 0540   TCO2 21 10/27/2016 1626   ACIDBASEDEF 3.0 (H) 10/27/2016 0540   O2SAT 98.0 10/27/2016 0540   CBG (last 3)   Recent Labs  10/30/16 1137 10/30/16 2224  10/31/16 0545  GLUCAP 197* 240* 197*    Assessment/Plan: S/P Procedure(s) (LRB): CORONARY ARTERY BYPASS GRAFTING (CABG) x 4 USING LEFT INTERNAL MAMMARY ARTERY TO LAD AND ENDOSCOPICALLY HARVESTED GREATER SAPHENOUS VEIN TO OM 1, 2 AND TO DIAG (N/A) TRANSESOPHAGEAL ECHOCARDIOGRAM (TEE) (N/A) ENDOVEIN HARVEST OF GREATER SAPHENOUS VEIN (Right)  1. CV- NSR, BP mildly elevated- continue Norvasc, Lopressor 2. Pulm- no acute issues, continue IS 3. Renal- creatinine trending down, diuretics transitioned to oral today per nephrology 4. DM- cbgs mostly controlled, continue current regimen 5. Deconditioning- patient lives alone, will get CSW consult for SNF placement 6. Dispo- patient stable, continue diuresis, watch creatinine, arrange for SNF placement   LOS: 15 days    BARRETT, ERIN 10/31/2016   Creatinine improving Walked 350 ft on Room air Last PCXR satisfactory w/o effusion Incisions clean agree with plan for SNF-  2 view CXR in am  Agree with above assessment P Prescott Gum MD

## 2016-10-31 NOTE — Progress Notes (Signed)
Patient ID: Shawn Collins, male   DOB: [birth year], 66 y.o.   MRN: [id number]  [facility] Progress Note    Subjective:    Says "going to a rehab" Up in the chair Says had some DOE and difficulty w/PT d/t fatigue Denies CP Legs are swollen  Objective:   BP 132/63 (BP Location: Left Arm)   Pulse 69   Temp 98.6 F (37 C) (Oral)   Resp 18   Ht 5\' 10"  (1.778 m)   Wt 75 kg (165 lb 4.8 oz) Comment: pt felt too weak to stand on standing scale  SpO2 93%   BMI 23.72 kg/m   Intake/Output Summary (Last 24 hours) at 10/31/16 1250 Last data filed at 10/31/16 1053  Gross per 24 hour  Intake              600 ml  Output              550 ml  Net               50 ml   Weight change: 2.78 kg (6 lb 2.1 oz)  Physical Exam: Sitting in the recliner NAD VS as noted S1S2 No S3 Healing sternotomy and leg incisions from vein harvest Lungs ant clear, diminished BS posteriorly Abd soft and not tender 2+ edema both LE's bilaterally  Imaging: Dg Chest Port 1 View  Result Date: 10/30/2016 CLINICAL DATA:  Follow-up chest tube removal EXAM: PORTABLE CHEST 1 VIEW COMPARISON:  10/29/2016 FINDINGS: Bilateral chest tubes of been removed. Miniscule amount of pleural air at each apex. Right internal jugular venous access sheath remains in place. Mild lower lobe atelectasis persists. IMPRESSION: Bilateral chest tubes removed. Miniscule amount of pleural air at each apex. No significant pneumothorax. Basilar atelectasis persists. Electronically Signed   By: Nelson Chimes M.D.   On: 10/30/2016 08:01     Recent Labs Lab 10/25/16 9562 10/26/16 0454  10/26/16 2247 10/27/16 0329 10/27/16 1614 10/27/16 1626 10/28/16 0459 10/29/16 0441 10/30/16 0419 10/31/16 0308  NA 131* 131*  < > 138 135  --  136 134* 137 133* 133*  K 4.3 4.1  < > 4.7 4.9  --  5.0 4.4 3.8 3.9 4.1  CL 102 104  < > 101 109  --  105 106 106 100* 101  CO2 19* 21*  --   --  21*  --   --  22 25 25 25   GLUCOSE 181* 265*  < >  141* 115*  --  112* 70 39* 182* 216*  BUN 43* 39*  < > 29* 25*  --  26* 28* 33* 33* 34*  CREATININE 1.99* 1.89*  < > 1.40* 1.46* 1.70* 1.70* 1.77* 1.89* 2.18* 1.93*  CALCIUM 8.4* 8.5*  --   --  7.8*  --   --  8.3* 8.3* 8.0* 8.3*  < > = values in this interval not displayed.      Component Value Date/Time   IRON 38 (L) 10/24/2016 1239   TIBC 186 (L) 10/24/2016 1239   FERRITIN 169 10/24/2016 1239   IRONPCTSAT 20 10/24/2016 1239    Recent Labs Lab 10/28/16 0459 10/29/16 0441 10/30/16 0419 10/31/16 0308  WBC 15.9* 16.0* 10.9* 9.1  HGB 9.9* 10.0* 8.9* 9.0*  HCT 28.9* 29.5* 26.7* 27.8*  MCV 86.5 87.3 87.8 88.8  PLT 173 208 227 269   Medications:    . acetaminophen  1,000 mg Oral Q6H  . amLODipine  10 mg Oral Daily  .  aspirin EC  325 mg Oral Daily  . bisacodyl  10 mg Oral Daily   Or  . bisacodyl  10 mg Rectal Daily  . darbepoetin (ARANESP) injection - NON-DIALYSIS  60 mcg Subcutaneous Q Sun-1800  . docusate sodium  200 mg Oral Daily  . enoxaparin (LOVENOX) injection  30 mg Subcutaneous QHS  . furosemide  40 mg Oral Daily  . insulin aspart  0-9 Units Subcutaneous TID WC  . metoprolol tartrate  12.5 mg Oral BID  . pantoprazole  40 mg Oral BID  . rosuvastatin  10 mg Oral q1800  . sodium chloride flush  3 mL Intravenous Q12H   Background: 66 yo M. PMH HTN, DM, CAD. CP->cath 5/25, CABG 5/30. BL creatinine "1.3-2". Renal following since before cath. No sig change in renal function since that time.   Assessment/ Plan:    1. AKI on CKD III-IV: Underlying CKD  likely hypertensive/ischemic nephropathy vs DM (proteinuria by dipstick/documented microalbuminuria). Creatinine around 2 since CABG, today sl better 1.93. Furosemide changed to oral from IV today. (Had been getting tandem albumin/Lasix). Still a lot of leg edema but CXR not impressive for extra fluid. Evaluate response to po lasix at current dose of 40 mg. 2. Coronary artery disease/NSTEMI/status post 4 V CABG 5/30. Dr.  Darcey Nora planning for SNF for further rehab. 3. Hypertension: Norvasc/lopressor 4. Anemia: CKD + ABLA. Now getting Aranesp 60/week. TSat was 20% 5/28. Give single dose of Feraheme 510 IV today. 5. Secondary hyperparathyroidism: PTH level within acceptable range for chronic kidney disease stage IV (86), calcium and phosphorus within goal. 6. Disposition - appears plan is for SNF. Certainly OK from a renal standpoint assuming stable creatinine next 24 hours with resumption of lasix. Would NOT resume his ACE at discharge. Can arrange for Dr. Posey Pronto to follow as outpt for his CKD.   Jamal Maes, MD [facility] [phone] Pager 10/31/2016, 12:51 PM

## 2016-10-31 NOTE — Progress Notes (Signed)
CARDIAC REHAB PHASE I   PRE:  Rate/Rhythm: 77 SR    BP: sitting 138/67    SaO2: 92 RA  MODE:  Ambulation: 350 ft   POST:  Rate/Rhythm: 92 SR    BP: sitting 157/72     SaO2: 95 RA  Pt struggled to get to EOB. Min assist to stand. Struggles to stay standing tall, leans over RW. Overall tolerated fairly well. To recliner. 500-600 mL on IS. Encouraged x2 more walks and IS. [place], ACSM 10/31/2016 9:26 AM

## 2016-10-31 NOTE — Anesthesia Postprocedure Evaluation (Signed)
Anesthesia Post Note  Patient: Shawn Collins  Procedure(s) Performed: Procedure(s) (LRB): CORONARY ARTERY BYPASS GRAFTING (CABG) x 4 USING LEFT INTERNAL MAMMARY ARTERY TO LAD AND ENDOSCOPICALLY HARVESTED GREATER SAPHENOUS VEIN TO OM 1, 2 AND TO DIAG (N/A) TRANSESOPHAGEAL ECHOCARDIOGRAM (TEE) (N/A) ENDOVEIN HARVEST OF GREATER SAPHENOUS VEIN (Right)     Anesthesia Post Evaluation  Last Vitals:  Vitals:   10/30/16 2225 10/31/16 0353  BP: 130/60 132/63  Pulse: 77 69  Resp: 18 18  Temp: 37.2 C 37 C    Last Pain:  Vitals:   10/31/16 1054  TempSrc:   PainSc: 0-No pain                 Jahzara Slattery S

## 2016-10-31 NOTE — Care Management Important Message (Signed)
Important Message  Patient Details  Name: Shawn Collins MRN: [id number] Date of Birth: 1951/02/05   Medicare Important Message Given:  Yes    Nathen May 10/31/2016, 12:34 PM

## 2016-11-01 LAB — CBC
HEMATOCRIT: 28.9 % — AB (ref 39.0–52.0)
Hemoglobin: 9.6 g/dL — ABNORMAL LOW (ref 13.0–17.0)
MCH: 29.3 pg (ref 26.0–34.0)
MCHC: 33.2 g/dL (ref 30.0–36.0)
MCV: 88.1 fL (ref 78.0–100.0)
PLATELETS: 277 10*3/uL (ref 150–400)
RBC: 3.28 MIL/uL — AB (ref 4.22–5.81)
RDW: 13.4 % (ref 11.5–15.5)
WBC: 9.5 10*3/uL (ref 4.0–10.5)

## 2016-11-01 LAB — GLUCOSE, CAPILLARY
GLUCOSE-CAPILLARY: 235 mg/dL — AB (ref 65–99)
Glucose-Capillary: 186 mg/dL — ABNORMAL HIGH (ref 65–99)
Glucose-Capillary: 181 mg/dL — ABNORMAL HIGH (ref 65–99)
Glucose-Capillary: 175 mg/dL — ABNORMAL HIGH (ref 65–99)

## 2016-11-01 LAB — RENAL FUNCTION PANEL
ALBUMIN: 2.4 g/dL — AB (ref 3.5–5.0)
Anion gap: 10 (ref 5–15)
BUN: 31 mg/dL — ABNORMAL HIGH (ref 6–20)
CHLORIDE: 101 mmol/L (ref 101–111)
CO2: 24 mmol/L (ref 22–32)
Calcium: 8.3 mg/dL — ABNORMAL LOW (ref 8.9–10.3)
Creatinine, Ser: 1.79 mg/dL — ABNORMAL HIGH (ref 0.61–1.24)
GFR, EST AFRICAN AMERICAN: 43 mL/min — AB (ref >60)
GFR, EST NON AFRICAN AMERICAN: 38 mL/min — AB (ref >60)
Glucose, Bld: 171 mg/dL — ABNORMAL HIGH (ref 65–99)
PHOSPHORUS: 2.5 mg/dL (ref 2.5–4.6)
POTASSIUM: 4.4 mmol/L (ref 3.5–5.1)
Sodium: 135 mmol/L (ref 135–145)

## 2016-11-01 MED ORDER — FUROSEMIDE 40 MG PO TABS
40.0000 mg | ORAL_TABLET | Freq: Two times a day (BID) | ORAL | Status: DC
  Administered 2016-11-01 – 2016-11-04 (×7): 40 mg via ORAL
  Filled 2016-11-01 (×7): qty 1

## 2016-11-01 NOTE — Progress Notes (Signed)
EPWs pulled per order and protocol.  Pt tolerated well.  Moderate bleeding at both sites, pressure held and dressings applied.  Pt understands bed rest for one hr with frequent vitals.  CCMD notified, will monitor closely.

## 2016-11-01 NOTE — Progress Notes (Signed)
CARDIAC REHAB PHASE I   PRE:  Rate/Rhythm: 75 SR    BP: sitting 116/72    SaO2: 91 RA  MODE:  Ambulation: 180 ft   POST:  Rate/Rhythm: 86 SR    BP: sitting 128/72     SaO2: 95-96 RA  Pt feeling very tired and SOB this pm. Sts he has felt like this all day. Sts he is not sleeping. Willing to walk but required numerous rests due to DOE. Shorter walk today. He struggles to move hips forward in recliner/bed. Rocked to stand and mod assist with gait belt. Pt encouraged to stand taller and closer to RW but he did not do this. Generally fatigued, return to recliner for more nap. [place], ACSM 11/01/2016 2:30 PM

## 2016-11-01 NOTE — Progress Notes (Addendum)
      [address]       [place] [address]             [phone]      6 Days Post-Op Procedure(s) (LRB): CORONARY ARTERY BYPASS GRAFTING (CABG) x 4 USING LEFT INTERNAL MAMMARY ARTERY TO LAD AND ENDOSCOPICALLY HARVESTED GREATER SAPHENOUS VEIN TO OM 1, 2 AND TO DIAG (N/A) TRANSESOPHAGEAL ECHOCARDIOGRAM (TEE) (N/A) ENDOVEIN HARVEST OF GREATER SAPHENOUS VEIN (Right)   Subjective:  Shawn Collins states he didn't get much sleep last night.  He states he just feels cold and weak today.    Objective: Vital signs in last 24 hours: Temp:  [97.4 F (36.3 C)-98.6 F (37 C)] 97.4 F (36.3 C) (06/05 0421) Pulse Rate:  [74-82] 82 (06/05 0421) Cardiac Rhythm: Normal sinus rhythm (06/05 0700) Resp:  [17-18] 18 (06/05 0421) BP: (102-148)/(63-74) 125/74 (06/05 0641) SpO2:  [93 %-94 %] 93 % (06/05 0421) Weight:  [161 lb 3.2 oz (73.1 kg)] 161 lb 3.2 oz (73.1 kg) (06/05 0421)  Intake/Output from previous day: 06/04 0701 - 06/05 0700 In: 840 [P.O.:840] Out: 1250 [Urine:1250]  General appearance: alert, cooperative and no distress Heart: regular rate and rhythm Lungs: clear to auscultation bilaterally Abdomen: soft, non-tender; bowel sounds normal; no masses,  no organomegaly Extremities: edema 2+ Wound: clean and dry  Lab Results:  Recent Labs  10/31/16 0308 11/01/16 0310  WBC 9.1 9.5  HGB 9.0* 9.6*  HCT 27.8* 28.9*  PLT 269 277   BMET:  Recent Labs  10/31/16 0308 11/01/16 0310  NA 133* 135  K 4.1 4.4  CL 101 101  CO2 25 24  GLUCOSE 216* 171*  BUN 34* 31*  CREATININE 1.93* 1.79*  CALCIUM 8.3* 8.3*    PT/INR: No results for input(s): LABPROT, INR in the last 72 hours. ABG    Component Value Date/Time   PHART 7.377 10/27/2016 0540   HCO3 21.8 10/27/2016 0540   TCO2 21 10/27/2016 1626   ACIDBASEDEF 3.0 (H) 10/27/2016 0540   O2SAT 98.0 10/27/2016 0540   CBG (last 3)   Recent Labs  10/31/16 1700 10/31/16 2039 11/01/16 0616  GLUCAP 326* 238*  186*    Assessment/Plan: S/P Procedure(s) (LRB): CORONARY ARTERY BYPASS GRAFTING (CABG) x 4 USING LEFT INTERNAL MAMMARY ARTERY TO LAD AND ENDOSCOPICALLY HARVESTED GREATER SAPHENOUS VEIN TO OM 1, 2 AND TO DIAG (N/A) TRANSESOPHAGEAL ECHOCARDIOGRAM (TEE) (N/A) ENDOVEIN HARVEST OF GREATER SAPHENOUS VEIN (Right)  1. CV- NSR- continue Norvasc, will continue Lopressor at current dose as BP should be a little higher for kidneys 2. Pulm- no acute issues, continue IS 3. Renal- creatinine continues to trend down, currently at 1.79, weight continues to trend down, continue Lasix, potassium supplementation... Nephrology ok with discharge as creatinine is stable with lasix 4. DM- cbgs controlled 5. Deconditioning- consulted social work yesterday, however patient states no one spoke with him 6. Dispo- patient stable, awaiting social work to evaluate patient, ready for SNF likely tomorrow   LOS: 16 days    Ellwood Handler 11/01/2016  agree with above assessment Ready for SNF tomorrow- recheck BMET Needs EPWs removed Will need 3 weeks lasix for wt still up 20 lbs Check 2 view CXR in am for postop effusion patient examined and medical record reviewed,agree with above note. Tharon Aquas Trigt III 11/01/2016

## 2016-11-01 NOTE — Clinical Social Work Note (Signed)
CSW met with pt at bedside to provide bed offers. Pt confirmed [facility] as selected SNF. CSW confirmed with Claiborne Billings at [facility] of semi -private room available. CSW called sister Dorian Pod [phone] to provide update of facility selection, as requested by pt. Pt likely to d/c tomorrow, 6/6. CSW will continue to follow for d/c needs.   Oretha Ellis, [place], [place] Social [facility]  [phone]

## 2016-11-01 NOTE — Progress Notes (Signed)
Patient ID: Shawn Collins, male   DOB: [birth year], 66 y.o.   MRN: [id number]  [facility] Progress Note    Subjective:    Just "doesn't feel good" Still quite a bit of LE edema UOP appears some better based on records but I think may be incomplete Just on 40/day of lasix Weight 73 kg so still 10 kg over admit weight  Objective:   BP (!) 151/86 (BP Location: Left Arm)   Pulse 87   Temp 97.4 F (36.3 C) (Oral)   Resp 18   Ht 5\' 10"  (1.778 m)   Wt 73.1 kg (161 lb 3.2 oz)   SpO2 93%   BMI 23.13 kg/m   Intake/Output Summary (Last 24 hours) at 11/01/16 1007 Last data filed at 11/01/16 0300  Gross per 24 hour  Intake              600 ml  Output             1000 ml  Net             -400 ml   Weight change: -1.86 kg (-4 lb 1.6 oz)  Physical Exam: Looks chronically ill NAD VS as noted S1S2 No S3 Healing sternotomy and leg incisions from vein harvest Lungs ant clear, diminished BS posteriorly Abd soft and not tender 2+ -3+ edema both LE's bilaterally/skin shiny and tight   Recent Labs Lab 10/26/16 0454  10/27/16 0329 10/27/16 1614 10/27/16 1626 10/28/16 0459 10/29/16 0441 10/30/16 0419 10/31/16 0308 11/01/16 0310  NA 131*  < > 135  --  136 134* 137 133* 133* 135  K 4.1  < > 4.9  --  5.0 4.4 3.8 3.9 4.1 4.4  CL 104  < > 109  --  105 106 106 100* 101 101  CO2 21*  --  21*  --   --  22 25 25 25 24   GLUCOSE 265*  < > 115*  --  112* 70 39* 182* 216* 171*  BUN 39*  < > 25*  --  26* 28* 33* 33* 34* 31*  CREATININE 1.89*  < > 1.46* 1.70* 1.70* 1.77* 1.89* 2.18* 1.93* 1.79*  CALCIUM 8.5*  --  7.8*  --   --  8.3* 8.3* 8.0* 8.3* 8.3*  PHOS  --   --   --   --   --   --   --   --   --  2.5  < > = values in this interval not displayed.      Component Value Date/Time   IRON 38 (L) 10/24/2016 1239   TIBC 186 (L) 10/24/2016 1239   FERRITIN 169 10/24/2016 1239   IRONPCTSAT 20 10/24/2016 1239    Recent Labs Lab 10/29/16 0441 10/30/16 0419 10/31/16 0308  11/01/16 0310  WBC 16.0* 10.9* 9.1 9.5  HGB 10.0* 8.9* 9.0* 9.6*  HCT 29.5* 26.7* 27.8* 28.9*  MCV 87.3 87.8 88.8 88.1  PLT 208 227 269 277   Medications:    . amLODipine  10 mg Oral Daily  . aspirin EC  325 mg Oral Daily  . bisacodyl  10 mg Oral Daily   Or  . bisacodyl  10 mg Rectal Daily  . darbepoetin (ARANESP) injection - NON-DIALYSIS  60 mcg Subcutaneous Q Mon-1800  . docusate sodium  200 mg Oral Daily  . enoxaparin (LOVENOX) injection  30 mg Subcutaneous QHS  . furosemide  40 mg Oral Daily  . insulin aspart  0-9  Units Subcutaneous TID WC  . metoprolol tartrate  12.5 mg Oral BID  . pantoprazole  40 mg Oral BID  . rosuvastatin  10 mg Oral q1800  . sodium chloride flush  3 mL Intravenous Q12H   Background: 66 yo M. PMH HTN, DM, CAD. CP->cath 5/25, CABG 5/30. BL creatinine "1.3-2". Renal following since before cath. No sig change in renal function since that time.   Assessment/ Plan:    1. AKI on CKD III-IV: Underlying CKD  likely hypertensive/ischemic nephropathy vs DM (proteinuria by dipstick/documented microalbuminuria). Creatinine around 2 since CABG, today sl better 1.79. Furosemide changed to oral 6/5. Is about 10 kg over admission weight with significant LE edema. I think 40 mg not quite enough - will increase to 40 BID and evaluate response, trend creatinine. 2. Coronary artery disease/NSTEMI/status post 4 V CABG 5/30. Dr. Darcey Nora planning for SNF for further rehab. 3. Hypertension: Norvasc/lopressor 4. Anemia: CKD + ABLA. Now getting Aranesp 60/week. [facility] be given at [facility]. Would need weekly Hb and hold if >11 TSat was 20% 5/28. Gave single dose of Feraheme 510 IV 6/4. 5. Secondary hyperparathyroidism: PTH level within acceptable range for chronic kidney disease stage IV (86), calcium and phosphorus within goal. 6. Disposition - Plan is for SNF. OK from a renal standpoint given stable/improved renal function.  Would NOT resume his ACE at discharge. Lasix dose 40 BID. Can  arrange for Dr. Posey Pronto to follow as outpt for his CKD. Renal will sign off at this time.  Jamal Maes, MD [facility] [phone] Pager 11/01/2016, 10:07 AM

## 2016-11-01 NOTE — Progress Notes (Signed)
Inpatient Diabetes Program Recommendations  AACE/ADA: New Consensus Statement on Inpatient Glycemic Control (2015)  Target Ranges:  Prepandial:   less than 140 mg/dL      Peak postprandial:   less than 180 mg/dL (1-2 hours)      Critically ill patients:  140 - 180 mg/dL   Lab Results  Component Value Date   GLUCAP 186 (H) 11/01/2016   HGBA1C 10.1 (H) 10/16/2016    Review of Glycemic Control Results for CHESTON, COURY (MRN [id number]) as of 11/01/2016 09:26  Ref. Range 10/31/2016 05:45 10/31/2016 11:20 10/31/2016 17:00 10/31/2016 20:39 11/01/2016 06:16  Glucose-Capillary Latest Ref Range: 65 - 99 mg/dL 197 (H) 258 (H) 326 (H) 238 (H) 186 (H)   Diabetes history: DM2 Outpatient Diabetes medications: Toujeo 20 units QHS, Amaryl 4 QAM. Current orders for Inpatient glycemic control: Novolog correction 0-9 units tid  Inpatient Diabetes Program Recommendations:  Noted postprandial CBGs elevated. Please consider while Amaryl held: -Novolog 4 units tid meal coverage if eats 50% -Add Novolog correction 0-5 units hs  Thank you, Bethena Roys E. Shakeitha Umbaugh, RN, MSN, CDE  Diabetes Coordinator Inpatient Glycemic Control Team Team Pager [phone] (8am-5pm) 11/01/2016 9:29 AM

## 2016-11-01 NOTE — Evaluation (Addendum)
Physical Therapy Evaluation Patient Details Name: Shawn Collins MRN: [id number] DOB: 07/12/1950 Today's Date: 11/01/2016   History of Present Illness  66 y.o. male admitted to [facility] on [facility]/16 for N/V and abdominal discomfort.  Troponins elevated NSTEMI , 3 vessel CAD and potential CABG PMHx of HTN, DM, COPD, and CKD stage III. Pt is s/p cardiac cath which revealed severe triple vessel disease and underwent CABG on 5/30.   Clinical Impression  Pt now s/p CABG and evaluated again. Pt continues to require assist with all mobility and will need ST-SNF at dc since he lives alone. Expect pt to make steady progress.    Follow Up Recommendations SNF    Equipment Recommendations  Rolling walker with 5" wheels    Recommendations for Other Services       Precautions / Restrictions Precautions Precautions: Fall      Mobility  Bed Mobility               General bed mobility comments: Sitting in recliner upon arrival   Transfers Overall transfer level: Needs assistance Equipment used: Rolling walker (2 wheeled) Transfers: Sit to/from Stand Sit to Stand: Mod assist         General transfer comment: Heavy assist to bring hips up and correct posterior lean  Ambulation/Gait Ambulation/Gait assistance: Min assist Ambulation Distance (Feet): 170 Feet Assistive device: Rolling walker (2 wheeled) Gait Pattern/deviations: Step-through pattern;Decreased stride length;Trunk flexed Gait velocity: Decreased Gait velocity interpretation: Below normal speed for age/gender General Gait Details: Verbal cues to stand more erect. Assist for balance and safety. Pt required several standing rest breaks.  Stairs            Wheelchair Mobility    Modified Rankin (Stroke Patients Only)       Balance Overall balance assessment: Needs assistance Sitting-balance support: No upper extremity supported;Feet supported Sitting balance-Leahy Scale: Fair     Standing balance support:  Bilateral upper extremity supported Standing balance-Leahy Scale: Poor Standing balance comment: walker and min A for static standing                             Pertinent Vitals/Pain Pain Assessment: No/denies pain    Home Living Family/patient expects to be discharged to:: Private residence Living Arrangements: Alone Available Help at Discharge: Family;Available PRN/intermittently Type of Home: House Home Access: Stairs to enter Entrance Stairs-Rails: Left Entrance Stairs-Number of Steps: 7 Home Layout: One level        Prior Function Level of Independence: Independent               Hand Dominance   Dominant Hand: Right    Extremity/Trunk Assessment   Upper Extremity Assessment Upper Extremity Assessment: Generalized weakness    Lower Extremity Assessment Lower Extremity Assessment: Generalized weakness       Communication   Communication: No difficulties  Cognition Arousal/Alertness: Awake/alert Behavior During Therapy: Flat affect Overall Cognitive Status: Impaired/Different from baseline Area of Impairment: Problem solving                             Problem Solving: Slow processing;Requires verbal cues        General Comments      Exercises     Assessment/Plan    PT Assessment Patient needs continued PT services  PT Problem List Decreased strength;Decreased activity tolerance;Decreased mobility;Decreased balance;Decreased knowledge of use of DME;Cardiopulmonary status limiting activity  PT Treatment Interventions DME instruction;Gait training;Stair training;Functional mobility training;Therapeutic activities;Therapeutic exercise;Balance training;Patient/family education    PT Goals (Current goals can be found in the Care Plan section)  Acute Rehab PT Goals Patient Stated Goal: feel better PT Goal Formulation: With patient Time For Goal Achievement: 11/09/16 Potential to Achieve Goals: Good    Frequency  Min 2X/week   Barriers to discharge Decreased caregiver support Lives alone    Co-evaluation               AM-PAC PT "6 Clicks" Daily Activity  Outcome Measure Difficulty turning over in bed (including adjusting bedclothes, sheets and blankets)?: Total Difficulty moving from lying on back to sitting on the side of the bed? : Total Difficulty sitting down on and standing up from a chair with arms (e.g., wheelchair, bedside commode, etc,.)?: Total Help needed moving to and from a bed to chair (including a wheelchair)?: A Lot Help needed walking in hospital room?: A Lot Help needed climbing 3-5 steps with a railing? : Total 6 Click Score: 8    End of Session Equipment Utilized During Treatment: Gait belt Activity Tolerance: Patient limited by fatigue Patient left: in chair;with call bell/phone within reach Nurse Communication: Mobility status PT Visit Diagnosis: Difficulty in walking, not elsewhere classified (R26.2);Muscle weakness (generalized) (M62.81)    Time: 1140-1156 PT Time Calculation (min) (ACUTE ONLY): 16 min   Charges:   PT Evaluation $PT Eval Moderate Complexity: 1 Procedure     PT G CodesMarland Kitchen        [facility] PT [place] 11/01/2016, 2:47 PM

## 2016-11-02 ENCOUNTER — Inpatient Hospital Stay (HOSPITAL_COMMUNITY): Payer: Medicare Other

## 2016-11-02 LAB — BASIC METABOLIC PANEL
Anion gap: 11 (ref 5–15)
BUN: 38 mg/dL — ABNORMAL HIGH (ref 6–20)
CO2: 21 mmol/L — ABNORMAL LOW (ref 22–32)
Calcium: 8.4 mg/dL — ABNORMAL LOW (ref 8.9–10.3)
Chloride: 98 mmol/L — ABNORMAL LOW (ref 101–111)
Creatinine, Ser: 2.34 mg/dL — ABNORMAL HIGH (ref 0.61–1.24)
GFR calc Af Amer: 31 mL/min — ABNORMAL LOW (ref >60)
GFR calc non Af Amer: 27 mL/min — ABNORMAL LOW (ref >60)
Glucose, Bld: 170 mg/dL — ABNORMAL HIGH (ref 65–99)
Potassium: 4.7 mmol/L (ref 3.5–5.1)
Sodium: 130 mmol/L — ABNORMAL LOW (ref 135–145)

## 2016-11-02 LAB — GLUCOSE, CAPILLARY
GLUCOSE-CAPILLARY: 213 mg/dL — AB (ref 65–99)
Glucose-Capillary: 162 mg/dL — ABNORMAL HIGH (ref 65–99)
Glucose-Capillary: 288 mg/dL — ABNORMAL HIGH (ref 65–99)
Glucose-Capillary: 314 mg/dL — ABNORMAL HIGH (ref 65–99)

## 2016-11-02 MED ORDER — INSULIN GLARGINE 100 UNIT/ML ~~LOC~~ SOLN
10.0000 [IU] | Freq: Every day | SUBCUTANEOUS | Status: DC
  Administered 2016-11-02: 10 [IU] via SUBCUTANEOUS
  Filled 2016-11-02: qty 0.1

## 2016-11-02 NOTE — Progress Notes (Signed)
Progress Note  Patient Name: Shawn Collins Date of Encounter: 11/02/2016  Primary Cardiologist: Radford Pax  Subjective   No chest pain or dyspnea Ate good breakfast  Inpatient Medications    Scheduled Meds: . amLODipine  10 mg Oral Daily  . aspirin EC  325 mg Oral Daily  . bisacodyl  10 mg Oral Daily   Or  . bisacodyl  10 mg Rectal Daily  . darbepoetin (ARANESP) injection - NON-DIALYSIS  60 mcg Subcutaneous Q Mon-1800  . docusate sodium  200 mg Oral Daily  . enoxaparin (LOVENOX) injection  30 mg Subcutaneous QHS  . furosemide  40 mg Oral BID  . insulin aspart  0-9 Units Subcutaneous TID WC  . insulin glargine  10 Units Subcutaneous QHS  . metoprolol tartrate  12.5 mg Oral BID  . pantoprazole  40 mg Oral BID  . rosuvastatin  10 mg Oral q1800  . sodium chloride flush  3 mL Intravenous Q12H   Continuous Infusions: . sodium chloride     PRN Meds: metoprolol tartrate, ondansetron (ZOFRAN) IV, oxyCODONE, sodium chloride flush, traMADol   Vital Signs    Vitals:   11/01/16 1228 11/01/16 2050 11/01/16 2115 11/02/16 0510  BP: 121/78 140/67  129/69  Pulse: 78 82  80  Resp: 19 18  18   Temp: 98.9 F (37.2 C) 99.9 F (37.7 C) 98.5 F (36.9 C) 98.3 F (36.8 C)  TempSrc: Oral Oral Oral Oral  SpO2: 93% 96%  93%  Weight:    161 lb 12.8 oz (73.4 kg)  Height:        Intake/Output Summary (Last 24 hours) at 11/02/16 0936 Last data filed at 11/02/16 0800  Gross per 24 hour  Intake              240 ml  Output              750 ml  Net             -510 ml   Filed Weights   10/31/16 0353 11/01/16 0421 11/02/16 0510  Weight: 165 lb 4.8 oz (75 kg) 161 lb 3.2 oz (73.1 kg) 161 lb 12.8 oz (73.4 kg)    Telemetry    NSR 11/02/2016  - Personally Reviewed  ECG    10/27/16 NSR anterolateral T wave changes ? Pericardial inflammation as well  - Personally Reviewed  Physical Exam  Black male  GEN: No acute distress.  Poor dentition  Neck: No JVD Cardiac: RRR, no murmurs, rubs, or  gallops. Post sternotomy  Respiratory: Decreased BS base bilateral  GI: Soft, nontender, non-distended  MS: No edema; No deformity. Neuro:  Nonfocal  Psych: Normal affect  Plus one bilateral Edema  Labs    Chemistry  Recent Labs Lab 10/30/16 0419 10/31/16 0308 11/01/16 0310 11/02/16 0235  NA 133* 133* 135 130*  K 3.9 4.1 4.4 4.7  CL 100* 101 101 98*  CO2 25 25 24  21*  GLUCOSE 182* 216* 171* 170*  BUN 33* 34* 31* 38*  CREATININE 2.18* 1.93* 1.79* 2.34*  CALCIUM 8.0* 8.3* 8.3* 8.4*  PROT 4.7* 5.2*  --   --   ALBUMIN 2.3* 2.7* 2.4*  --   AST 20 22  --   --   ALT 14* 15*  --   --   ALKPHOS 54 72  --   --   BILITOT 0.8 0.7  --   --   GFRNONAA 30* 34* 38* 27*  GFRAA 34* 40* 43*  31*  ANIONGAP 8 7 10 11      Hematology  Recent Labs Lab 10/30/16 0419 10/31/16 0308 11/01/16 0310  WBC 10.9* 9.1 9.5  RBC 3.04* 3.13* 3.28*  HGB 8.9* 9.0* 9.6*  HCT 26.7* 27.8* 28.9*  MCV 87.8 88.8 88.1  MCH 29.3 28.8 29.3  MCHC 33.3 32.4 33.2  RDW 13.7 13.6 13.4  PLT 227 269 277    Cardiac EnzymesNo results for input(s): TROPONINI in the last 168 hours. No results for input(s): TROPIPOC in the last 168 hours.   BNPNo results for input(s): BNP, PROBNP in the last 168 hours.   DDimer No results for input(s): DDIMER in the last 168 hours.   Radiology    Dg Chest 2 View  Result Date: 11/02/2016 CLINICAL DATA:  Status post coronary bypass grafting EXAM: CHEST  2 VIEW COMPARISON:  10/30/2016 FINDINGS: Cardiac shadow is mildly enlarged but stable. Postsurgical changes are again seen. The right jugular sheath has been removed. Small bilateral pleural effusions left greater than right are noted with likely underlying atelectatic changes. No pneumothorax is seen. No bony abnormality is noted. IMPRESSION: Bilateral pleural effusions left greater than right. Electronically Signed   By: Inez Catalina M.D.   On: 11/02/2016 08:00    Cardiac Studies   Cath reviewed films severe 3VD with normal LV  function  TEE OP:  EF 50-55% trace MR apical wall motion small pericardial effusion  Patient Profile     66 y.o. male severe 3V CAD normal LV function post CABG 10/26/16 LIMA to LAD SVG to  OM1/OM2 and SVG D1.  Post op with some volume overload and bilateral pleural effusions BP up some and CRF with Cr elevated today at 2.34  Assessment & Plan    1) CAD/CABG:  7 days post op sternum healing well No arrhythmia pleural effusions seem small don't think thoracentesis will be needed per CVTS Continue ASA/beta blocker  2) CRF:  Followed by Dr Lorrene Reid no change in lasix dose  3) HTN:  Per renal consider adding hydralazine   D/c to rehab soon outpatient f/u Dr Radford Pax   Signed, Jenkins Rouge, MD  11/02/2016, 9:36 AM

## 2016-11-02 NOTE — Progress Notes (Signed)
CARDIAC REHAB PHASE I   PRE:  Rate/Rhythm: 84 SR    BP: sitting 147/72    SaO2: 90-91 RA  MODE:  Ambulation: 270 ft   POST:  Rate/Rhythm: 96 SR    BP: sitting 147/72     SaO2: 95 RA  Pt more awake today. He still is unable to scoot hips forward from sitting position. Does better to just stand with mod assist from farther back in recliner. Used RW, steady, slow.  C/o significant scrotum swelling that is rubbing him as he walked. Uncomfortable. Return to recliner and scrotum elevated. Pt still with only 500 mL IS. This might be his baseline (only 500 mL preop as well). Encouraged more walking.  [place], ACSM 11/02/2016 12:07 PM

## 2016-11-02 NOTE — Clinical Social Work Note (Signed)
CSW met with pt at bedside to confirm acceptance of semi-private room at [facility]. Pt agreed to semi-private room. [name] was present at bedside. Pt will not discharge today due MD to monitor I & O. Will likely be ready for d/c tomorrow if stable. CSW updated Claiborne Billings at [place], [place]) and confirmed plan with RN. CSW will continue to follow for discharge needs.   Oretha Ellis, [place], [place] Social Work [phone]

## 2016-11-02 NOTE — Progress Notes (Addendum)
[address]       [employer] [address]             [phone]      7 Days Post-Op Procedure(s) (LRB): CORONARY ARTERY BYPASS GRAFTING (CABG) x 4 USING LEFT INTERNAL MAMMARY ARTERY TO LAD AND ENDOSCOPICALLY HARVESTED GREATER SAPHENOUS VEIN TO OM 1, 2 AND TO DIAG (N/A) TRANSESOPHAGEAL ECHOCARDIOGRAM (TEE) (N/A) ENDOVEIN HARVEST OF GREATER SAPHENOUS VEIN (Right) Subjective: Feels ok, appetite slowly coming back, ambulation conts to improve   Objective: Vital signs in last 24 hours: Temp:  [98.3 F (36.8 C)-99.9 F (37.7 C)] 98.3 F (36.8 C) (06/06 0510) Pulse Rate:  [78-87] 80 (06/06 0510) Cardiac Rhythm: Normal sinus rhythm;Bundle branch block (06/05 2015) Resp:  [18-19] 18 (06/06 0510) BP: (121-159)/(67-89) 129/69 (06/06 0510) SpO2:  [93 %-96 %] 93 % (06/06 0510) Weight:  [161 lb 12.8 oz (73.4 kg)] 161 lb 12.8 oz (73.4 kg) (06/06 0510)  Hemodynamic parameters for last 24 hours:    Intake/Output from previous day: 06/05 0701 - 06/06 0700 In: 480 [P.O.:480] Out: 700 [Urine:700] Intake/Output this shift: No intake/output data recorded.  General appearance: alert, cooperative and no distress Heart: regular rate and rhythm Lungs: dim in lower fields Abdomen: benign Extremities: + R>L LE edema Wound: incis healing well  Lab Results:  Recent Labs  10/31/16 0308 11/01/16 0310  WBC 9.1 9.5  HGB 9.0* 9.6*  HCT 27.8* 28.9*  PLT 269 277   BMET:  Recent Labs  11/01/16 0310 11/02/16 0235  NA 135 130*  K 4.4 4.7  CL 101 98*  CO2 24 21*  GLUCOSE 171* 170*  BUN 31* 38*  CREATININE 1.79* 2.34*  CALCIUM 8.3* 8.4*    PT/INR: No results for input(s): LABPROT, INR in the last 72 hours. ABG    Component Value Date/Time   PHART 7.377 10/27/2016 0540   HCO3 21.8 10/27/2016 0540   TCO2 21 10/27/2016 1626   ACIDBASEDEF 3.0 (H) 10/27/2016 0540   O2SAT 98.0 10/27/2016 0540   CBG (last 3)   Recent Labs  11/01/16 1626 11/01/16 2111  11/02/16 0613  GLUCAP 181* 175* 162*    Meds Scheduled Meds: . amLODipine  10 mg Oral Daily  . aspirin EC  325 mg Oral Daily  . bisacodyl  10 mg Oral Daily   Or  . bisacodyl  10 mg Rectal Daily  . darbepoetin (ARANESP) injection - NON-DIALYSIS  60 mcg Subcutaneous Q Mon-1800  . docusate sodium  200 mg Oral Daily  . enoxaparin (LOVENOX) injection  30 mg Subcutaneous QHS  . furosemide  40 mg Oral BID  . insulin aspart  0-9 Units Subcutaneous TID WC  . metoprolol tartrate  12.5 mg Oral BID  . pantoprazole  40 mg Oral BID  . rosuvastatin  10 mg Oral q1800  . sodium chloride flush  3 mL Intravenous Q12H   Continuous Infusions: . sodium chloride     PRN Meds:.metoprolol tartrate, ondansetron (ZOFRAN) IV, oxyCODONE, sodium chloride flush, traMADol  Xrays No results found.  Assessment/Plan: S/P Procedure(s) (LRB): CORONARY ARTERY BYPASS GRAFTING (CABG) x 4 USING LEFT INTERNAL MAMMARY ARTERY TO LAD AND ENDOSCOPICALLY HARVESTED GREATER SAPHENOUS VEIN TO OM 1, 2 AND TO DIAG (N/A) TRANSESOPHAGEAL ECHOCARDIOGRAM (TEE) (N/A) ENDOVEIN HARVEST OF GREATER SAPHENOUS VEIN (Right)  1 overall doing well, sinus rhythm some HTN- will see what nephrology advises - diuretic was increased yesterday 2 has bil pleural effusions- may benefit from thoracentesis  3 sugars  fair control on SSI- no home meds started yet will add glargine at 10 units for now, no amaryl yet 4 cont rehab  Addendum : spoke with Dr Lorrene Reid - will leave lasix as is and try to get more accurate UO. No discharge today as we try to monitor this and determine need for possible thoracentesis  LOS: 17 days    Shawn Collins E 11/02/2016

## 2016-11-03 ENCOUNTER — Inpatient Hospital Stay (HOSPITAL_COMMUNITY): Payer: Medicare Other

## 2016-11-03 ENCOUNTER — Ambulatory Visit: Payer: Medicare Other | Admitting: Cardiology

## 2016-11-03 ENCOUNTER — Encounter (HOSPITAL_COMMUNITY): Payer: Self-pay | Admitting: General Surgery

## 2016-11-03 HISTORY — PX: IR THORACENTESIS ASP PLEURAL SPACE W/IMG GUIDE: IMG5380

## 2016-11-03 LAB — BASIC METABOLIC PANEL
Anion gap: 10 (ref 5–15)
BUN: 46 mg/dL — AB (ref 6–20)
CHLORIDE: 96 mmol/L — AB (ref 101–111)
CO2: 24 mmol/L (ref 22–32)
CREATININE: 2.3 mg/dL — AB (ref 0.61–1.24)
Calcium: 8.5 mg/dL — ABNORMAL LOW (ref 8.9–10.3)
GFR calc non Af Amer: 28 mL/min — ABNORMAL LOW (ref >60)
GFR, EST AFRICAN AMERICAN: 32 mL/min — AB (ref >60)
Glucose, Bld: 227 mg/dL — ABNORMAL HIGH (ref 65–99)
Potassium: 4.7 mmol/L (ref 3.5–5.1)
Sodium: 130 mmol/L — ABNORMAL LOW (ref 135–145)

## 2016-11-03 LAB — GLUCOSE, CAPILLARY
GLUCOSE-CAPILLARY: 171 mg/dL — AB (ref 65–99)
Glucose-Capillary: 203 mg/dL — ABNORMAL HIGH (ref 65–99)
Glucose-Capillary: 169 mg/dL — ABNORMAL HIGH (ref 65–99)

## 2016-11-03 MED ORDER — LIDOCAINE HCL 1 % IJ SOLN
INTRAMUSCULAR | Status: AC
  Filled 2016-11-03: qty 20

## 2016-11-03 MED ORDER — PANTOPRAZOLE SODIUM 40 MG PO TBEC
40.0000 mg | DELAYED_RELEASE_TABLET | Freq: Every day | ORAL | Status: DC
  Administered 2016-11-03 – 2016-11-04 (×2): 40 mg via ORAL
  Filled 2016-11-03 (×2): qty 1

## 2016-11-03 MED ORDER — INSULIN GLARGINE 100 UNIT/ML ~~LOC~~ SOLN
20.0000 [IU] | Freq: Every day | SUBCUTANEOUS | Status: DC
  Administered 2016-11-03: 20 [IU] via SUBCUTANEOUS
  Filled 2016-11-03: qty 0.2

## 2016-11-03 MED ORDER — LIDOCAINE HCL (PF) 1 % IJ SOLN
INTRAMUSCULAR | Status: DC | PRN
  Administered 2016-11-03: 10 mL

## 2016-11-03 NOTE — Procedures (Signed)
Ultrasound-guided therapeutic left thoracentesis performed yielding 0.5 liters of serosanguineous colored fluid. No immediate complications. Follow-up chest x-ray pending.       Marian Meneely E 12:55 PM 11/03/2016

## 2016-11-03 NOTE — Progress Notes (Signed)
Physical Therapy Treatment Patient Details Name: Shawn Collins MRN: [id number] DOB: 1951/02/18 Today's Date: 11/03/2016    History of Present Illness 66 y.o. male admitted to [facility] on 10/15/16 for N/V and abdominal discomfort.  Troponins elevated NSTEMI , 3 vessel CAD and potential CABG PMHx of HTN, DM, COPD, and CKD stage III. Pt is s/p cardiac cath which revealed severe triple vessel disease and underwent CABG on 5/30.     PT Comments    Pt pleasant and very willing to walk but reports decreased mobility and difficulty due to scrotal edema and pain. Donned mesh underwear with washcloth for support with pt stating greatly improved comfort and able to mobilize further today. Pt continues to have greatest difficulty with scooting and standing and does not recall sternal precautions despite education with constant cues not to push. Sternal handout provided. Pt educated for HEP and will continue to follow. SNF remains appropriate as pt without assist at home.   HR 80-98 with activity Pre BP 149/75 Post 151/100 97% on RA   Follow Up Recommendations  SNF;Supervision/Assistance - 24 hour     Equipment Recommendations  Rolling walker with 5" wheels    Recommendations for Other Services       Precautions / Restrictions Precautions Precautions: Fall;Sternal Restrictions Weight Bearing Restrictions: Yes    Mobility  Bed Mobility Overal bed mobility: Needs Assistance Bed Mobility: Rolling;Sidelying to Sit Rolling: Min guard Sidelying to sit: Mod assist       General bed mobility comments: pt with cues for sequence to roll, mod assist to elevate trunk from surface. Mod assist to scoot forward with reciprocal scooting and pad due to sternal precautions and scrotal edema  Transfers Overall transfer level: Needs assistance   Transfers: Sit to/from Stand Sit to Stand: Min assist         General transfer comment: cues for hand placement, sequence and safety with assist for anterior  translation and rise  Ambulation/Gait Ambulation/Gait assistance: Min assist Ambulation Distance (Feet): 450 Feet Assistive device: Rolling walker (2 wheeled) Gait Pattern/deviations: Step-through pattern;Decreased stride length;Trunk flexed   Gait velocity interpretation: Below normal speed for age/gender General Gait Details: Verbal cues to stand more erect and step into RW. Wide BOS. Pt stating impaired gait due to scrotal pain but improved with underwear   Stairs            Wheelchair Mobility    Modified Rankin (Stroke Patients Only)       Balance                                            Cognition Arousal/Alertness: Awake/alert Behavior During Therapy: WFL for tasks assessed/performed Overall Cognitive Status: Impaired/Different from baseline Area of Impairment: Memory                     Memory: Decreased recall of precautions         General Comments: pt states "don't use my arms' but unable to recall specific precautions and needs constant cues not to push      Exercises General Exercises - Lower Extremity Long Arc Quad: AROM;Both;Seated;15 reps Hip Flexion/Marching: AROM;Both;Seated;15 reps Toe Raises: AROM;Both;Seated;15 reps Heel Raises: AROM;Both;Seated;15 reps    General Comments        Pertinent Vitals/Pain Faces Pain Scale: Hurts little more Pain Location: scrotum Pain Descriptors / Indicators: Sore Pain Intervention(s):  Limited activity within patient's tolerance;Repositioned    Home Living                      Prior Function            PT Goals (current goals can now be found in the care plan section) Progress towards PT goals: Progressing toward goals    Frequency    Min 2X/week      PT Plan Current plan remains appropriate    Co-evaluation              AM-PAC PT "6 Clicks" Daily Activity  Outcome Measure  Difficulty turning over in bed (including adjusting bedclothes,  sheets and blankets)?: Total Difficulty moving from lying on back to sitting on the side of the bed? : Total Difficulty sitting down on and standing up from a chair with arms (e.g., wheelchair, bedside commode, etc,.)?: Total Help needed moving to and from a bed to chair (including a wheelchair)?: A Little Help needed walking in hospital room?: A Little Help needed climbing 3-5 steps with a railing? : A Lot 6 Click Score: 11    End of Session Equipment Utilized During Treatment: Gait belt Activity Tolerance: Patient tolerated treatment well Patient left: in chair;with call bell/phone within reach Nurse Communication: Mobility status PT Visit Diagnosis: Difficulty in walking, not elsewhere classified (R26.2);Muscle weakness (generalized) (M62.81)     Time: 2094-7096 PT Time Calculation (min) (ACUTE ONLY): 25 min  Charges:  $Gait Training: 8-22 mins $Therapeutic Exercise: 8-22 mins                    G Codes:       Elwyn Reach, PT [phone]   [place] 11/03/2016, 12:18 PM

## 2016-11-03 NOTE — Progress Notes (Signed)
Progress Note  Patient Name: Callahan Wild Date of Encounter: 11/03/2016  Primary Cardiologist: Radford Pax  Subjective   Nausea no vomiting   Inpatient Medications    Scheduled Meds: . amLODipine  10 mg Oral Daily  . aspirin EC  325 mg Oral Daily  . bisacodyl  10 mg Oral Daily   Or  . bisacodyl  10 mg Rectal Daily  . darbepoetin (ARANESP) injection - NON-DIALYSIS  60 mcg Subcutaneous Q Mon-1800  . docusate sodium  200 mg Oral Daily  . enoxaparin (LOVENOX) injection  30 mg Subcutaneous QHS  . furosemide  40 mg Oral BID  . insulin aspart  0-9 Units Subcutaneous TID WC  . insulin glargine  20 Units Subcutaneous QHS  . metoprolol tartrate  12.5 mg Oral BID  . pantoprazole  40 mg Oral Daily  . rosuvastatin  10 mg Oral q1800  . sodium chloride flush  3 mL Intravenous Q12H   Continuous Infusions: . sodium chloride     PRN Meds: metoprolol tartrate, ondansetron (ZOFRAN) IV, oxyCODONE, sodium chloride flush, traMADol   Vital Signs    Vitals:   11/02/16 1106 11/02/16 1305 11/02/16 2016 11/03/16 0531  BP: (!) 142/72 116/63 126/69 137/72  Pulse: 87 73 79 77  Resp:  18 18 18   Temp:  98.1 F (36.7 C) 98.6 F (37 C) 98.1 F (36.7 C)  TempSrc:  Oral Oral Oral  SpO2:  96% 97% 93%  Weight:    162 lb (73.5 kg)  Height:        Intake/Output Summary (Last 24 hours) at 11/03/16 0935 Last data filed at 11/03/16 0852  Gross per 24 hour  Intake                0 ml  Output             1000 ml  Net            -1000 ml   Filed Weights   11/01/16 0421 11/02/16 0510 11/03/16 0531  Weight: 161 lb 3.2 oz (73.1 kg) 161 lb 12.8 oz (73.4 kg) 162 lb (73.5 kg)    Telemetry    NSR 11/03/2016  - Personally Reviewed  ECG    10/27/16 NSR anterolateral T wave changes ? Pericardial inflammation as well  - Personally Reviewed  Physical Exam  Black male  GEN: No acute distress.  Poor dentition  Neck: No JVD Cardiac: RRR, no murmurs, rubs, or gallops. Post sternotomy  Respiratory:  Decreased BS base bilateral  GI: Soft, nontender, non-distended  MS: No edema; No deformity. Neuro:  Nonfocal  Psych: Normal affect  Plus one bilateral Edema  Labs    Chemistry  Recent Labs Lab 10/30/16 0419 10/31/16 0308 11/01/16 0310 11/02/16 0235 11/03/16 0505  NA 133* 133* 135 130* 130*  K 3.9 4.1 4.4 4.7 4.7  CL 100* 101 101 98* 96*  CO2 25 25 24  21* 24  GLUCOSE 182* 216* 171* 170* 227*  BUN 33* 34* 31* 38* 46*  CREATININE 2.18* 1.93* 1.79* 2.34* 2.30*  CALCIUM 8.0* 8.3* 8.3* 8.4* 8.5*  PROT 4.7* 5.2*  --   --   --   ALBUMIN 2.3* 2.7* 2.4*  --   --   AST 20 22  --   --   --   ALT 14* 15*  --   --   --   ALKPHOS 54 72  --   --   --   BILITOT 0.8 0.7  --   --   --  GFRNONAA 30* 34* 38* 27* 28*  GFRAA 34* 40* 43* 31* 32*  ANIONGAP 8 7 10 11 10      Hematology  Recent Labs Lab 10/30/16 0419 10/31/16 0308 11/01/16 0310  WBC 10.9* 9.1 9.5  RBC 3.04* 3.13* 3.28*  HGB 8.9* 9.0* 9.6*  HCT 26.7* 27.8* 28.9*  MCV 87.8 88.8 88.1  MCH 29.3 28.8 29.3  MCHC 33.3 32.4 33.2  RDW 13.7 13.6 13.4  PLT 227 269 277    Cardiac EnzymesNo results for input(s): TROPONINI in the last 168 hours. No results for input(s): TROPIPOC in the last 168 hours.   BNPNo results for input(s): BNP, PROBNP in the last 168 hours.   DDimer No results for input(s): DDIMER in the last 168 hours.   Radiology    Dg Chest 2 View  Result Date: 11/02/2016 CLINICAL DATA:  Status post coronary bypass grafting EXAM: CHEST  2 VIEW COMPARISON:  10/30/2016 FINDINGS: Cardiac shadow is mildly enlarged but stable. Postsurgical changes are again seen. The right jugular sheath has been removed. Small bilateral pleural effusions left greater than right are noted with likely underlying atelectatic changes. No pneumothorax is seen. No bony abnormality is noted. IMPRESSION: Bilateral pleural effusions left greater than right. Electronically Signed   By: Inez Catalina M.D.   On: 11/02/2016 08:00    Cardiac  Studies   Cath reviewed films severe 3VD with normal LV function  TEE OP:  EF 50-55% trace MR apical wall motion small pericardial effusion  Patient Profile     66 y.o. male severe 3V CAD normal LV function post CABG 10/26/16 LIMA to LAD SVG to  OM1/OM2 and SVG D1.  Post op with some volume overload and bilateral pleural effusions BP up some and CRF with Cr elevated today at 2.34  Assessment & Plan    1) CAD/CABG:  7 days post op sternum healing well No arrhythmia pleural effusions to have IR thoracentesis today  2) CRF:  Followed by Dr Lorrene Reid managing lasix dose  3) HTN:  Per renal consider adding hydralazine in reasonable range this am 4) Nausea Zofran consider KUB when in radiology for thoracentesis 5) DM:  Insulin dose increased   D/c to rehab soon outpatient f/u Dr Radford Pax   Signed, Jenkins Rouge, MD  11/03/2016, 9:35 AM

## 2016-11-03 NOTE — Care Management Important Message (Signed)
Important Message  Patient Details  Name: Shawn Collins MRN: [id number] Date of Birth: July 02, 1950   Medicare Important Message Given:  Yes    Brettany Sydney Abena 11/03/2016, 11:50 AM

## 2016-11-03 NOTE — Progress Notes (Signed)
Inpatient Diabetes Program Recommendations  AACE/ADA: New Consensus Statement on Inpatient Glycemic Control (2015)  Target Ranges:  Prepandial:   less than 140 mg/dL      Peak postprandial:   less than 180 mg/dL (1-2 hours)      Critically ill patients:  140 - 180 mg/dL   Lab Results  Component Value Date   GLUCAP 203 (H) 11/03/2016   HGBA1C 10.1 (H) 10/16/2016    Review of Glycemic Control Results for Shawn Collins, Shawn Collins (MRN [id number]) as of 11/03/2016 10:34  Ref. Range 11/02/2016 06:13 11/02/2016 11:21 11/02/2016 16:08 11/02/2016 21:10 11/03/2016 06:12  Glucose-Capillary Latest Ref Range: 65 - 99 mg/dL 162 (H) 213 (H) 288 (H) 314 (H) 203 (H)   Inpatient Diabetes Program Recommendations:  Noted postprandial CBGs elevated. Please consider while Amaryl held: -Novolog 4 units tid meal coverage if eats 50% -Add Novolog correction 0-5 units hs  Thank you, Bethena Roys E. Dermot Gremillion, RN, MSN, CDE  Diabetes Coordinator Inpatient Glycemic Control Team Team Pager [phone] (8am-5pm) 11/03/2016 10:34 AM

## 2016-11-03 NOTE — Progress Notes (Signed)
[address]       [place] [address]             [phone]      8 Days Post-Op Procedure(s) (LRB): CORONARY ARTERY BYPASS GRAFTING (CABG) x 4 USING LEFT INTERNAL MAMMARY ARTERY TO LAD AND ENDOSCOPICALLY HARVESTED GREATER SAPHENOUS VEIN TO OM 1, 2 AND TO DIAG (N/A) TRANSESOPHAGEAL ECHOCARDIOGRAM (TEE) (N/A) ENDOVEIN HARVEST OF GREATER SAPHENOUS VEIN (Right) Subjective: Feels nauseated this am  Objective: Vital signs in last 24 hours: Temp:  [98.1 F (36.7 C)-98.6 F (37 C)] 98.1 F (36.7 C) (06/07 0531) Pulse Rate:  [73-87] 77 (06/07 0531) Cardiac Rhythm: Normal sinus rhythm (06/06 1900) Resp:  [18] 18 (06/07 0531) BP: (116-142)/(63-72) 137/72 (06/07 0531) SpO2:  [93 %-97 %] 93 % (06/07 0531) Weight:  [162 lb (73.5 kg)] 162 lb (73.5 kg) (06/07 0531)  Hemodynamic parameters for last 24 hours:    Intake/Output from previous day: 06/06 0701 - 06/07 0700 In: -  Out: 950 [Urine:950] Intake/Output this shift: No intake/output data recorded.  General appearance: alert, cooperative and no distress Heart: regular rate and rhythm Lungs: dim in lower fields Abdomen: benign Extremities: incis healing well Wound: incis healing well  Lab Results:  Recent Labs  11/01/16 0310  WBC 9.5  HGB 9.6*  HCT 28.9*  PLT 277   BMET:  Recent Labs  11/02/16 0235 11/03/16 0505  NA 130* 130*  K 4.7 4.7  CL 98* 96*  CO2 21* 24  GLUCOSE 170* 227*  BUN 38* 46*  CREATININE 2.34* 2.30*  CALCIUM 8.4* 8.5*    PT/INR: No results for input(s): LABPROT, INR in the last 72 hours. ABG    Component Value Date/Time   PHART 7.377 10/27/2016 0540   HCO3 21.8 10/27/2016 0540   TCO2 21 10/27/2016 1626   ACIDBASEDEF 3.0 (H) 10/27/2016 0540   O2SAT 98.0 10/27/2016 0540   CBG (last 3)   Recent Labs  11/02/16 1608 11/02/16 2110 11/03/16 0612  GLUCAP 288* 314* 203*    Meds Scheduled Meds: . amLODipine  10 mg Oral Daily  . aspirin EC  325 mg Oral Daily    . bisacodyl  10 mg Oral Daily   Or  . bisacodyl  10 mg Rectal Daily  . darbepoetin (ARANESP) injection - NON-DIALYSIS  60 mcg Subcutaneous Q Mon-1800  . docusate sodium  200 mg Oral Daily  . enoxaparin (LOVENOX) injection  30 mg Subcutaneous QHS  . furosemide  40 mg Oral BID  . insulin aspart  0-9 Units Subcutaneous TID WC  . insulin glargine  10 Units Subcutaneous QHS  . metoprolol tartrate  12.5 mg Oral BID  . pantoprazole  40 mg Oral BID  . rosuvastatin  10 mg Oral q1800  . sodium chloride flush  3 mL Intravenous Q12H   Continuous Infusions: . sodium chloride     PRN Meds:.metoprolol tartrate, ondansetron (ZOFRAN) IV, oxyCODONE, sodium chloride flush, traMADol  Xrays Dg Chest 2 View  Result Date: 11/02/2016 CLINICAL DATA:  Status post coronary bypass grafting EXAM: CHEST  2 VIEW COMPARISON:  10/30/2016 FINDINGS: Cardiac shadow is mildly enlarged but stable. Postsurgical changes are again seen. The right jugular sheath has been removed. Small bilateral pleural effusions left greater than right are noted with likely underlying atelectatic changes. No pneumothorax is seen. No bony abnormality is noted. IMPRESSION: Bilateral pleural effusions left greater than right. Electronically Signed   By: Inez Catalina M.D.   On:  11/02/2016 08:00    Assessment/Plan: S/P Procedure(s) (LRB): CORONARY ARTERY BYPASS GRAFTING (CABG) x 4 USING LEFT INTERNAL MAMMARY ARTERY TO LAD AND ENDOSCOPICALLY HARVESTED GREATER SAPHENOUS VEIN TO OM 1, 2 AND TO DIAG (N/A) TRANSESOPHAGEAL ECHOCARDIOGRAM (TEE) (N/A) ENDOVEIN HARVEST OF GREATER SAPHENOUS VEIN (Right)  1 stable 2 sugars poorly controlled- will increase insulin dose 3 will get US guided thoracentesis- d/w Dr Cyndia Bent 4 creat stable at 2.3- wt stable, remains edematous, will see what nephrology says about diuretic dosing- spoke with Dr Lorrene Reid yesterday 5 BP a bit better controlled- no change to meds for now 6 reduce protonix to daily 7 limit narcotics  as able with nausea    LOS: 18 days    Shawn Collins E 11/03/2016

## 2016-11-03 NOTE — Progress Notes (Signed)
Pt c/o severe pain after thoracentesis. Has received pain meds. Sts he can't walk right now. Encouraged him to walk later this pm. Yves Dill CES, ACSM 2:09 PM 11/03/2016

## 2016-11-04 LAB — BASIC METABOLIC PANEL
ANION GAP: 9 (ref 5–15)
BUN: 42 mg/dL — AB (ref 6–20)
CHLORIDE: 98 mmol/L — AB (ref 101–111)
CO2: 25 mmol/L (ref 22–32)
Calcium: 8.5 mg/dL — ABNORMAL LOW (ref 8.9–10.3)
Creatinine, Ser: 2.06 mg/dL — ABNORMAL HIGH (ref 0.61–1.24)
GFR, EST AFRICAN AMERICAN: 37 mL/min — AB (ref >60)
GFR, EST NON AFRICAN AMERICAN: 32 mL/min — AB (ref >60)
Glucose, Bld: 186 mg/dL — ABNORMAL HIGH (ref 65–99)
POTASSIUM: 4.3 mmol/L (ref 3.5–5.1)
SODIUM: 132 mmol/L — AB (ref 135–145)

## 2016-11-04 LAB — GLUCOSE, CAPILLARY
GLUCOSE-CAPILLARY: 165 mg/dL — AB (ref 65–99)
GLUCOSE-CAPILLARY: 156 mg/dL — AB (ref 65–99)
Glucose-Capillary: 120 mg/dL — ABNORMAL HIGH (ref 65–99)

## 2016-11-04 MED ORDER — ROSUVASTATIN CALCIUM 10 MG PO TABS: 10.0000 mg | ORAL_TABLET | Freq: Every day | ORAL | Status: DC

## 2016-11-04 MED ORDER — OXYCODONE HCL 5 MG PO TABS
5.0000 mg | ORAL_TABLET | Freq: Four times a day (QID) | ORAL | 0 refills | Status: DC | PRN
Start: 2016-11-04 — End: 2017-01-09

## 2016-11-04 MED ORDER — ASPIRIN 325 MG PO TBEC: 325.0000 mg | DELAYED_RELEASE_TABLET | Freq: Every day | ORAL | 0 refills | Status: DC

## 2016-11-04 MED ORDER — METOPROLOL TARTRATE 25 MG PO TABS: 12.5000 mg | ORAL_TABLET | Freq: Two times a day (BID) | ORAL | 1 refills | Status: DC

## 2016-11-04 MED ORDER — FUROSEMIDE 40 MG PO TABS: 40.0000 mg | ORAL_TABLET | Freq: Two times a day (BID) | ORAL | 0 refills | Status: DC

## 2016-11-04 NOTE — Progress Notes (Signed)
[address]       [place] [address]             [phone]      9 Days Post-Op Procedure(s) (LRB): CORONARY ARTERY BYPASS GRAFTING (CABG) x 4 USING LEFT INTERNAL MAMMARY ARTERY TO LAD AND ENDOSCOPICALLY HARVESTED GREATER SAPHENOUS VEIN TO OM 1, 2 AND TO DIAG (N/A) TRANSESOPHAGEAL ECHOCARDIOGRAM (TEE) (N/A) ENDOVEIN HARVEST OF GREATER SAPHENOUS VEIN (Right) Subjective: Feels better after thoracentesis , less SOB  Objective: Vital signs in last 24 hours: Temp:  [98 F (36.7 C)-98.8 F (37.1 C)] 98.8 F (37.1 C) (06/08 0527) Pulse Rate:  [73-83] 83 (06/08 0527) Cardiac Rhythm: Normal sinus rhythm (06/07 1900) Resp:  [18-20] 18 (06/08 0527) BP: (117-137)/(68-80) 131/79 (06/08 0527) SpO2:  [94 %-96 %] 96 % (06/08 0527) Weight:  [159 lb 1.6 oz (72.2 kg)] 159 lb 1.6 oz (72.2 kg) (06/08 0527)  Hemodynamic parameters for last 24 hours:    Intake/Output from previous day: 06/07 0701 - 06/08 0700 In: 480 [P.O.:480] Out: 1650 [Urine:1650] Intake/Output this shift: No intake/output data recorded.  General appearance: alert, cooperative and no distress Heart: regular rate and rhythm and occas extrasystole Lungs: mildly dim in bases Abdomen: benign Extremities: edema about the same in LE's Wound: incis healing well  Lab Results: No results for input(s): WBC, HGB, HCT, PLT in the last 72 hours. BMET:  Recent Labs  11/02/16 0235 11/03/16 0505  NA 130* 130*  K 4.7 4.7  CL 98* 96*  CO2 21* 24  GLUCOSE 170* 227*  BUN 38* 46*  CREATININE 2.34* 2.30*  CALCIUM 8.4* 8.5*    PT/INR: No results for input(s): LABPROT, INR in the last 72 hours. ABG    Component Value Date/Time   PHART 7.377 10/27/2016 0540   HCO3 21.8 10/27/2016 0540   TCO2 21 10/27/2016 1626   ACIDBASEDEF 3.0 (H) 10/27/2016 0540   O2SAT 98.0 10/27/2016 0540   CBG (last 3)   Recent Labs  11/03/16 1135 11/03/16 1610 11/04/16 0623  GLUCAP 169* 171* 120*    Meds Scheduled  Meds: . amLODipine  10 mg Oral Daily  . aspirin EC  325 mg Oral Daily  . bisacodyl  10 mg Oral Daily   Or  . bisacodyl  10 mg Rectal Daily  . darbepoetin (ARANESP) injection - NON-DIALYSIS  60 mcg Subcutaneous Q Mon-1800  . docusate sodium  200 mg Oral Daily  . enoxaparin (LOVENOX) injection  30 mg Subcutaneous QHS  . furosemide  40 mg Oral BID  . insulin aspart  0-9 Units Subcutaneous TID WC  . insulin glargine  20 Units Subcutaneous QHS  . metoprolol tartrate  12.5 mg Oral BID  . pantoprazole  40 mg Oral Daily  . rosuvastatin  10 mg Oral q1800  . sodium chloride flush  3 mL Intravenous Q12H   Continuous Infusions: . sodium chloride     PRN Meds:.lidocaine (PF), metoprolol tartrate, ondansetron (ZOFRAN) IV, oxyCODONE, sodium chloride flush, traMADol  Xrays Dg Chest 1 View  Result Date: 11/03/2016 CLINICAL DATA:  Post LEFT thoracentesis EXAM: CHEST 1 VIEW COMPARISON:  11/02/2016 FINDINGS: Enlargement of cardiac silhouette post CABG. Mediastinal contours and pulmonary vascularity normal. Decreased LEFT pleural effusion and basilar atelectasis since previous study. No pneumothorax following thoracentesis. Mild persistent RIGHT basilar atelectasis and small effusion. IMPRESSION: Decrease in LEFT pleural effusion and basilar atelectasis post thoracentesis without evidence of pneumothorax. Persistent mild RIGHT basilar atelectasis and small pleural effusion. Electronically  Signed   By: Lavonia Dana M.D.   On: 11/03/2016 13:06   Ir Thoracentesis Asp Pleural Space W/img Guide  Result Date: 11/03/2016 INDICATION: NSTEMI, status post CABG. Left-sided pleural effusion noted on recent imaging. Request is made for therapeutic thoracentesis. EXAM: ULTRASOUND GUIDED THERAPEUTIC THORACENTESIS MEDICATIONS: 1% lidocaine COMPLICATIONS: None immediate. PROCEDURE: An ultrasound guided thoracentesis was thoroughly discussed with the patient and questions answered. The benefits, risks, alternatives and  complications were also discussed. The patient understands and wishes to proceed with the procedure. Written consent was obtained. Ultrasound was performed to localize and [place] an adequate pocket of fluid in the left chest. The area was then prepped and draped in the normal sterile fashion. 1% Lidocaine was used for local anesthesia. Under ultrasound guidance a Safe-T-Centesis catheter was introduced. Thoracentesis was performed. The catheter was removed and a dressing applied. FINDINGS: A total of approximately 0.5 L of serosanguineous fluid was removed. IMPRESSION: Successful ultrasound guided left thoracentesis yielding 0.5 L of pleural fluid. Read by: Saverio Danker, PA-C Electronically Signed   By: Sandi Mariscal M.D.   On: 11/03/2016 13:02    Assessment/Plan: S/P Procedure(s) (LRB): CORONARY ARTERY BYPASS GRAFTING (CABG) x 4 USING LEFT INTERNAL MAMMARY ARTERY TO LAD AND ENDOSCOPICALLY HARVESTED GREATER SAPHENOUS VEIN TO OM 1, 2 AND TO DIAG (N/A) TRANSESOPHAGEAL ECHOCARDIOGRAM (TEE) (N/A) ENDOVEIN HARVEST OF GREATER SAPHENOUS VEIN (Right)   1 improving- feels better after thoracentesis clinically 2 will check BMET this am and if creat stable poss transfer to SNF later today 3 sugars a little better- follow on current dose  LOS: 19 days    GOLD,WAYNE E 11/04/2016

## 2016-11-04 NOTE — Progress Notes (Signed)
Called report to [facility]. [facility] aware of p/u need. IV and tele monitor d/c'd. Pt bathed and dressed. Call bell and phone within reach.

## 2016-11-04 NOTE — Care Management Note (Signed)
Case Management Note Previous CM note initiated by Bethena Roys, RN--10/20/2016, 3:57 PM   Patient Details  Name: Shawn Collins MRN: [id number] Date of Birth: Oct 20, 1950  Subjective/Objective:  Pt presented for Chest Pain-Nstemi, N/V and abdominal pain. Plan for cardiac cath once Cr is decreased.              Action/Plan: PT recommendations for SNF. CSW assisting with disposition needs. CM will continue to monitor.   Expected Discharge Date:  11/04/16               Expected Discharge Plan:  Skilled Nursing Facility  In-House Referral:  Clinical Social Work  Discharge planning Services  CM Consult  Post Acute Care Choice:  NA Choice offered to:  NA  DME Arranged:  N/A DME Agency:  NA  HH Arranged:  NA HH Agency:  NA  Status of Service:  Completed, signed off  If discussed at 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 CABG 10-26-16. Plan was previously for SNF. CM will continue to monitor for disposition needs.   Dahlia Client [place], RN 11/04/2016, 2:51 PM [phone]

## 2016-11-04 NOTE — Progress Notes (Signed)
Clinical Social Worker facilitated patient discharge including contacting patient family and facility to confirm patient discharge plans.  Clinical information faxed to facility and family agreeable with plan.  CSW arranged ambulance transport via [facility] to [facility] .  RN to call [phone] (pt will go in rm# [phone] )report prior to discharge.  Clinical Social Worker will sign off for now as social work intervention is no longer needed. Please consult [place] again if new need arises.  Rhea Pink, MSW, [place]

## 2016-11-04 NOTE — Progress Notes (Addendum)
CARDIAC REHAB PHASE I   PRE:  Rate/Rhythm: 76 SR    BP: sitting 133/74    SaO2: 95 RA  MODE:  Ambulation: 270 ft   POST:  Rate/Rhythm: 92 SR    BP: sitting 139/75     SaO2: 98 RA  Pt requires mod assist to stand due to lack of ability to get close to edge of recliner. Steady walking with RW, main c/o is scrotal edema and rubbing. Tired toward end, some SOB. To recliner again. Began ed however pt got visitor. I will return after lunch to finish ed. [phone]   [place], ACSM 11/04/2016 11:50 AM   Finished ed with pt. Voiced understanding. He admits to eating fast food/junk food to make his A1C 10.1. Discussed better choices. Will refer to [place]. [place], ACSM 1:08 PM 11/04/2016

## 2016-11-04 NOTE — Progress Notes (Signed)
Progress Note  Patient Name: Shawn Collins Date of Encounter: 11/04/2016  Primary Cardiologist: Radford Pax  Subjective   Less dyspnea more spry   Inpatient Medications    Scheduled Meds: . amLODipine  10 mg Oral Daily  . aspirin EC  325 mg Oral Daily  . bisacodyl  10 mg Oral Daily   Or  . bisacodyl  10 mg Rectal Daily  . darbepoetin (ARANESP) injection - NON-DIALYSIS  60 mcg Subcutaneous Q Mon-1800  . docusate sodium  200 mg Oral Daily  . enoxaparin (LOVENOX) injection  30 mg Subcutaneous QHS  . furosemide  40 mg Oral BID  . insulin aspart  0-9 Units Subcutaneous TID WC  . insulin glargine  20 Units Subcutaneous QHS  . metoprolol tartrate  12.5 mg Oral BID  . pantoprazole  40 mg Oral Daily  . rosuvastatin  10 mg Oral q1800  . sodium chloride flush  3 mL Intravenous Q12H   Continuous Infusions: . sodium chloride     PRN Meds: lidocaine (PF), metoprolol tartrate, ondansetron (ZOFRAN) IV, oxyCODONE, sodium chloride flush, traMADol   Vital Signs    Vitals:   11/03/16 1231 11/03/16 1500 11/03/16 1945 11/04/16 0527  BP: 117/70 124/71 131/68 131/79  Pulse:  73 81 83  Resp:  20 18 18   Temp:  98 F (36.7 C) 98.2 F (36.8 C) 98.8 F (37.1 C)  TempSrc:  Oral Oral Oral  SpO2:  95% 94% 96%  Weight:    159 lb 1.6 oz (72.2 kg)  Height:        Intake/Output Summary (Last 24 hours) at 11/04/16 0956 Last data filed at 11/04/16 0528  Gross per 24 hour  Intake              240 ml  Output             1550 ml  Net            -1310 ml   Filed Weights   11/02/16 0510 11/03/16 0531 11/04/16 0527  Weight: 161 lb 12.8 oz (73.4 kg) 162 lb (73.5 kg) 159 lb 1.6 oz (72.2 kg)    Telemetry    NSR 11/04/2016  - Personally Reviewed  ECG    10/27/16 NSR anterolateral T wave changes ? Pericardial inflammation as well  - Personally Reviewed  Physical Exam  Black male  GEN: No acute distress.  Poor dentition  Neck: No JVD Cardiac: RRR, no murmurs, rubs, or gallops. Post sternotomy    Respiratory: Improved BS base post thoracentesis  GI: Soft, nontender, non-distended  MS: No edema; No deformity. Neuro:  Nonfocal  Psych: Normal affect  Plus one bilateral Edema  Labs    Chemistry  Recent Labs Lab 10/30/16 0419 10/31/16 0308 11/01/16 0310 11/02/16 0235 11/03/16 0505  NA 133* 133* 135 130* 130*  K 3.9 4.1 4.4 4.7 4.7  CL 100* 101 101 98* 96*  CO2 25 25 24  21* 24  GLUCOSE 182* 216* 171* 170* 227*  BUN 33* 34* 31* 38* 46*  CREATININE 2.18* 1.93* 1.79* 2.34* 2.30*  CALCIUM 8.0* 8.3* 8.3* 8.4* 8.5*  PROT 4.7* 5.2*  --   --   --   ALBUMIN 2.3* 2.7* 2.4*  --   --   AST 20 22  --   --   --   ALT 14* 15*  --   --   --   ALKPHOS 54 72  --   --   --   BILITOT  0.8 0.7  --   --   --   GFRNONAA 30* 34* 38* 27* 28*  GFRAA 34* 40* 43* 31* 32*  ANIONGAP 8 7 10 11 10      Hematology  Recent Labs Lab 10/30/16 0419 10/31/16 0308 11/01/16 0310  WBC 10.9* 9.1 9.5  RBC 3.04* 3.13* 3.28*  HGB 8.9* 9.0* 9.6*  HCT 26.7* 27.8* 28.9*  MCV 87.8 88.8 88.1  MCH 29.3 28.8 29.3  MCHC 33.3 32.4 33.2  RDW 13.7 13.6 13.4  PLT 227 269 277    Cardiac EnzymesNo results for input(s): TROPONINI in the last 168 hours. No results for input(s): TROPIPOC in the last 168 hours.   BNPNo results for input(s): BNP, PROBNP in the last 168 hours.   DDimer No results for input(s): DDIMER in the last 168 hours.   Radiology    Dg Chest 1 View  Result Date: 11/03/2016 CLINICAL DATA:  Post LEFT thoracentesis EXAM: CHEST 1 VIEW COMPARISON:  11/02/2016 FINDINGS: Enlargement of cardiac silhouette post CABG. Mediastinal contours and pulmonary vascularity normal. Decreased LEFT pleural effusion and basilar atelectasis since previous study. No pneumothorax following thoracentesis. Mild persistent RIGHT basilar atelectasis and small effusion. IMPRESSION: Decrease in LEFT pleural effusion and basilar atelectasis post thoracentesis without evidence of pneumothorax. Persistent mild RIGHT basilar  atelectasis and small pleural effusion. Electronically Signed   By: Lavonia Dana M.D.   On: 11/03/2016 13:06   Ir Thoracentesis Asp Pleural Space W/img Guide  Result Date: 11/03/2016 INDICATION: NSTEMI, status post CABG. Left-sided pleural effusion noted on recent imaging. Request is made for therapeutic thoracentesis. EXAM: ULTRASOUND GUIDED THERAPEUTIC THORACENTESIS MEDICATIONS: 1% lidocaine COMPLICATIONS: None immediate. PROCEDURE: An ultrasound guided thoracentesis was thoroughly discussed with the patient and questions answered. The benefits, risks, alternatives and complications were also discussed. The patient understands and wishes to proceed with the procedure. Written consent was obtained. Ultrasound was performed to localize and [place] an adequate pocket of fluid in the left chest. The area was then prepped and draped in the normal sterile fashion. 1% Lidocaine was used for local anesthesia. Under ultrasound guidance a Safe-T-Centesis catheter was introduced. Thoracentesis was performed. The catheter was removed and a dressing applied. FINDINGS: A total of approximately 0.5 L of serosanguineous fluid was removed. IMPRESSION: Successful ultrasound guided left thoracentesis yielding 0.5 L of pleural fluid. Read by: Saverio Danker, PA-C Electronically Signed   By: Sandi Mariscal M.D.   On: 11/03/2016 13:02    Cardiac Studies   Cath reviewed films severe 3VD with normal LV function  TEE OP:  EF 50-55% trace MR apical wall motion small pericardial effusion  Patient Profile     66 y.o. male severe 3V CAD normal LV function post CABG 10/26/16 [place] to LAD SVG to  OM1/OM2 and SVG D1.  Post op with some volume overload and bilateral pleural effusions BP up some and CRF with Cr elevated today at 2.34  Assessment & Plan    1) CAD/CABG:  9 days post op sternum healing well No arrhythmia  2) CRF:  Followed by Dr Lorrene Reid managing lasix dose Cr 2.3 yesterday  3) HTN:  Per renal consider adding hydralazine in  reasonable range this am 4) Nausea Zofran improved BS positive no ileus 5) DM:  Insulin dose increased  6) Pleural effusions - post thoracentesis on left with .5 L serosanguineous fluid Reviewed CXR post tap and no penumothorax persistent mild right basilar atelectasis and small effusion  D/c to rehab soon outpatient f/u Dr Radford Pax  Signed, Jenkins Rouge, MD  11/04/2016, 9:56 AM

## 2016-11-11 ENCOUNTER — Telehealth (HOSPITAL_COMMUNITY): Payer: Self-pay

## 2016-11-11 NOTE — Telephone Encounter (Signed)
Patient insurance are active and benefits verified. Patient has Medicare A/B and Medicaid. Medicare A/B - no co-payment, no deductible, no out of pocket and pre-authorization. Passport/reference [phone]. Medicaid - no co-payment, no deductible, no out of pocket, no limit and no pre-authorization. Passport/reference [phone].  Patient referral is on hold until further notice, patient was discharged to a skilled nursing facility.

## 2016-11-16 ENCOUNTER — Ambulatory Visit: Payer: Medicare Other | Admitting: Cardiology

## 2016-11-18 ENCOUNTER — Encounter: Payer: Self-pay | Admitting: Cardiology

## 2016-12-01 ENCOUNTER — Ambulatory Visit: Payer: Medicare Other | Admitting: Cardiothoracic Surgery

## 2016-12-02 ENCOUNTER — Other Ambulatory Visit: Payer: Self-pay | Admitting: Cardiothoracic Surgery

## 2016-12-02 DIAGNOSIS — Z951 Presence of aortocoronary bypass graft: Secondary | ICD-10-CM

## 2016-12-05 ENCOUNTER — Ambulatory Visit
Admission: RE | Admit: 2016-12-05 | Discharge: 2016-12-05 | Disposition: A | Discharge status: Home / Self Care | Payer: Medicare Other | Source: Ambulatory Visit | Attending: Cardiothoracic Surgery | Admitting: Cardiothoracic Surgery

## 2016-12-05 ENCOUNTER — Ambulatory Visit (INDEPENDENT_AMBULATORY_CARE_PROVIDER_SITE_OTHER): Payer: Self-pay | Admitting: Physician Assistant

## 2016-12-05 VITALS — BP 107/64 | HR 72 | Resp 20 | Ht 70.0 in | Wt 136.0 lb

## 2016-12-05 DIAGNOSIS — J90 Pleural effusion, not elsewhere classified: Secondary | ICD-10-CM

## 2016-12-05 DIAGNOSIS — Z951 Presence of aortocoronary bypass graft: Secondary | ICD-10-CM

## 2016-12-05 NOTE — Progress Notes (Signed)
[address]       [place] [address]             [phone]      Shawn Collins is a 66 y.o. male patient who recently underwent a CABG x 4 on 10/26/2016. POD 8 he underwent a left-sided thoracentesis where 547mL of serosanguineous fluid was drained. He was discharged to a SNF per PTs recommendations. He was followed by Dr. Lorrene Reid inpatient for AKI on CKD. Should have a follow-up with Dr. Posey Pronto.      1. S/P CABG x 4   2. Pleural effusion    Past Medical History:  Diagnosis Date  . CKD (chronic kidney disease) stage 3, GFR 30-59 ml/min 09/2016  . Diabetes mellitus without complication ([place])   . Emphysema lung ([place])   . Hyperlipidemia   . Hypertension     Scheduled Meds: Current Outpatient Prescriptions on File Prior to Visit  Medication Sig Dispense Refill  . amLODipine (NORVASC) 10 MG tablet Take 1 tablet (10 mg total) by mouth daily. (Patient not taking: Reported on 10/16/2016) 30 tablet 0  . aspirin EC 325 MG EC tablet Take 1 tablet (325 mg total) by mouth daily. 30 tablet 0  . furosemide (LASIX) 40 MG tablet Take 1 tablet (40 mg total) by mouth 2 (two) times daily. 60 tablet 0  . Insulin Glargine (TOUJEO SOLOSTAR) 300 UNIT/ML SOPN Inject 20 Units into the skin at bedtime.    . metoprolol tartrate (LOPRESSOR) 25 MG tablet Take 0.5 tablets (12.5 mg total) by mouth 2 (two) times daily. 30 tablet 1  . oxyCODONE (OXY IR/ROXICODONE) 5 MG immediate release tablet Take 1 tablet (5 mg total) by mouth every 6 (six) hours as needed for severe pain. 30 tablet 0  . rosuvastatin (CRESTOR) 10 MG tablet Take 1 tablet (10 mg total) by mouth daily at 6 PM.     No current facility-administered medications on file prior to visit.     Allergies  Allergen Reactions  . No Known Allergies     Blood pressure 107/64, pulse 72, resp. rate 20, height 5\' 10"  (1.778 m), weight 61.7 kg (136 lb), SpO2 99 %.  Subjective: Shawn Collins is overall doing quite well status post  coronary bypass grafting 4 and left sided thoracentesis.  Objective: Cor: RRR, no murmur Pulm: CTA bilaterally Abd:  Non-tender Ext:1-2 + pitting edema R > L Wound: clean and dry, healing well, EVH site dry no erythema   Chest x-ray from today reveals a small left pleural effusion. Sternal wires are intact and in line.  CLINICAL DATA:  CABG on 10/26/2016  EXAM: CHEST  2 VIEW  COMPARISON:  11/03/2016 and 11/02/2016  FINDINGS: There are small residual pleural effusions, left greater than right with slight atelectasis at the left lung base. These have diminished since 11/02/2016.  No pneumothorax.  Heart size and vascularity are normal.  IMPRESSION: Small residual effusions, left greater than right. Slight atelectasis at the left lung base. Findings have improved since 11/02/2016.   Electronically Signed   By: Lorriane Shire M.D.   On: 12/05/2016 15:04  Assessment & Plan  Shawn Collins presents today status post coronary bypass grafting 4 and postoperative left-sided ultrasound guided thoracentesis. On chest x-ray today he does have a small left pleural effusion. The patient shares that he is able to walk short distances without shortness of breath but if he walks long distances he does become short of breath. He has  not taken any pain medicine since surgery and did not know that he was unable to drive. The patient has been driving for a while. I officially cleared the patient to drive today. I asked him to use common sense and if he feels poorly not to get behind a motor vehicle. He shared with me that he has home physical therapy that has been coming to his house and working on mobility. He sees a nephrologist tomorrow, Dr. Posey Pronto, and plans to review his medication list with him. There seems to be some confusion with his dose of Lasix. We discharged the patient on 40 mg Lasix twice a day however he has been taking 20 mg or half a tab of Lasix twice a day. He shares that he  feels that's what the prescription said and he is simply following what he was told. I gave him a fresh medication list today from our office but I will leave that up to the decision of the nephrologist to adjust his Lasix at this point. I reviewed his chest x-ray with him today and I plan to bring him back to the office in a few weeks with a another chest x-ray to follow his small left pleural effusion. I have a feeling that this at some point will need another thoracentesis. He continues to lose weight and has had about a 25 pound weight loss since his admission. Initially, he shared that he didn't have much of an appetite but it is certainly starting to pick up. He plans to try and gain weight at this point. He does have some pitting edema in his lower extremity the right side greater than the left side. We harvested the vein on the right sides of this is to be expected. I shared that he may wrap the leg with an Ace bandage and use warm compresses if there is pain in the leg. He does seem to have a small palpable cord on the right side. I explained that this will absorb on its own. His sternum is stable to palpation. I shared we would give him a referral for outpatient cardiac rehabilitation if he was interested. He needed some time to think about this. His home physical therapy might be enough at this point. He had no further questions at this time. We will follow up with Shawn Collins in 4 weeks with a follow-up chest x-ray to reevaluate his left pleural effusion.  Elgie Collard 12/05/2016

## 2016-12-05 NOTE — Patient Instructions (Addendum)
[facility]: [phone]. Dr. Posey Pronto. Appointment tomorrow.   We will follow up with Shawn Collins in 4 weeks with a follow-up chest x-ray to reevaluate his left pleural effusion.

## 2016-12-06 ENCOUNTER — Ambulatory Visit: Payer: Medicare Other | Admitting: Cardiology

## 2016-12-07 ENCOUNTER — Encounter: Payer: Self-pay | Admitting: Cardiology

## 2016-12-12 ENCOUNTER — Telehealth: Payer: Self-pay

## 2016-12-12 DIAGNOSIS — Z951 Presence of aortocoronary bypass graft: Secondary | ICD-10-CM

## 2016-12-12 NOTE — Telephone Encounter (Signed)
Per Dr. Radford Pax, patient will need GXT to determine METs for Cardiac Rehab insurance coverage.   GXT to be scheduled in August as patient is s/p CABG 5/30.

## 2016-12-15 NOTE — Telephone Encounter (Signed)
Reviewed GXT instructions with patient and he has no questions. Scheduled patient 8/15 for GXT. Patient agrees with treatment plan.

## 2017-01-06 ENCOUNTER — Other Ambulatory Visit: Payer: Self-pay | Admitting: Cardiothoracic Surgery

## 2017-01-06 DIAGNOSIS — J90 Pleural effusion, not elsewhere classified: Secondary | ICD-10-CM

## 2017-01-09 ENCOUNTER — Ambulatory Visit
Admission: RE | Admit: 2017-01-09 | Discharge: 2017-01-09 | Disposition: A | Discharge status: Home / Self Care | Payer: Medicare Other | Source: Ambulatory Visit | Attending: Cardiothoracic Surgery | Admitting: Cardiothoracic Surgery

## 2017-01-09 ENCOUNTER — Ambulatory Visit (INDEPENDENT_AMBULATORY_CARE_PROVIDER_SITE_OTHER): Payer: Self-pay | Admitting: Surgical

## 2017-01-09 VITALS — BP 133/85 | HR 69 | Resp 18 | Ht 70.0 in | Wt 133.0 lb

## 2017-01-09 DIAGNOSIS — J90 Pleural effusion, not elsewhere classified: Secondary | ICD-10-CM

## 2017-01-09 DIAGNOSIS — Z951 Presence of aortocoronary bypass graft: Secondary | ICD-10-CM

## 2017-01-09 NOTE — Progress Notes (Signed)
[address]       ,[place] [address]             [phone]      Ember Earnhart Longfellow Medical Record #[id number] Date of Birth: January 26, 1951  Referring: Nolene Ebbs, MD Primary Care: Nolene Ebbs, MD  Chief Complaint:   POST OP FOLLOW UP                              OPERATIVE REPORT   PREOPERATIVE DIAGNOSES: 1. Critical left anterior descending diagonal obstruction and 3-vessel     coronary artery disease with recent non-ST-elevation myocardial     infarction. 2. Moderate bilateral pleural effusions. 3. Anemia of chronic disease. 4. 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  POSTOPERATIVE DIAGNOSES: 1. Critical left anterior descending diagonal obstruction and 3-vessel     coronary artery disease with recent non-ST-elevation myocardial     infarction. 2. Moderate bilateral pleural effusions. 3. Anemia of chronic disease. 4. 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 History of Present Illness:    This is a 66 year old male status post the above described procedure seen in the office on today's date in further follow-up. After his initial office follow-up he was found to have a pleural effusion on the left side. He is seen today to reevaluate and chest x-ray was repeated with excellent resolution. He does not appear to have made his cardiology appointment that was scheduled for him after hospitalization. He did spend a short time in a skilled nursing facility post discharge. He is not having any shortness of breath. He is not having any difficulty with his incisions. He denies palpitations. He is not having any difficulty with lower extremity or peripheral edema.      Past Medical History:  Diagnosis Date  . CKD (chronic kidney disease) stage 3, GFR 30-59 ml/min 09/2016  . Diabetes mellitus without complication ([place])   . Emphysema lung ([place])   . Hyperlipidemia   .  Hypertension      History  Smoking Status  . Former Smoker  . Packs/day: 0.25  . Years: 8.00  . Types: Cigarettes  . Quit date: 1972  Smokeless Tobacco  . Never Used    History  Alcohol Use No     Allergies  Allergen Reactions  . No Known Allergies     Current Outpatient Prescriptions  Medication Sig Dispense Refill  . amLODipine (NORVASC) 10 MG tablet Take 1 tablet (10 mg total) by mouth daily. 30 tablet 0  . aspirin EC 325 MG EC tablet Take 1 tablet (325 mg total) by mouth daily. 30 tablet 0  . furosemide (LASIX) 40 MG tablet Take 1 tablet (40 mg total) by mouth 2 (two) times daily. 60 tablet 0  . Insulin Glargine (TOUJEO SOLOSTAR) 300 UNIT/ML SOPN Inject 20 Units into the skin at bedtime.    . metoprolol tartrate (LOPRESSOR) 25 MG tablet Take 0.5 tablets (12.5 mg total) by mouth 2 (two) times daily. 30 tablet 1  . rosuvastatin (CRESTOR) 10 MG tablet Take 1 tablet (10 mg total) by mouth daily at 6 PM.     No current facility-administered medications for this visit.        Physical Exam: BP 133/85   Pulse 69   Resp 18   Ht 5\' 10"  (1.778 m)  Wt 133 lb (60.3 kg)   SpO2 99%   BMI 19.08 kg/m   General appearance: alert, cooperative and no distress Heart: regular rate and rhythm Lungs: clear to auscultation bilaterally Abdomen: soft, non-tender; bowel sounds normal; no masses,  no organomegaly Extremities:  No edema Wound: Well-healed without evidence of infection   Diagnostic Studies & Laboratory data:     Recent Radiology Findings:   Dg Chest 2 View  Result Date: 01/09/2017 CLINICAL DATA:  Pleural effusion. EXAM: CHEST  2 VIEW COMPARISON:  Chest x-ray dated December 05, 2016. FINDINGS: Prior CABG. The cardiomediastinal silhouette is normal in size. Normal pulmonary vascularity. Resolved pleural effusions. No consolidation or pneumothorax. No acute osseous abnormality. IMPRESSION: Resolved pleural effusions.  No active cardiopulmonary disease. Electronically  Signed   By: Titus Dubin M.D.   On: 01/09/2017 14:22      Recent Lab Findings: Lab Results  Component Value Date   WBC 9.5 11/01/2016   HGB 9.6 (L) 11/01/2016   HCT 28.9 (L) 11/01/2016   PLT 277 11/01/2016   GLUCOSE 186 (H) 11/04/2016   CHOL 158 10/16/2016   TRIG 49 10/16/2016   HDL 48 10/16/2016   LDLCALC 100 (H) 10/16/2016   ALT 15 (L) 10/31/2016   AST 22 10/31/2016   NA 132 (L) 11/04/2016   K 4.3 11/04/2016   CL 98 (L) 11/04/2016   CREATININE 2.06 (H) 11/04/2016   BUN 42 (H) 11/04/2016   CO2 25 11/04/2016   INR 1.47 10/26/2016   HGBA1C 10.1 (H) 10/16/2016      Assessment / Plan:  The patient is overall doing quite well. I sent a message to cardiology to schedule him a follow-up appointment. As he has no further pleural effusions and no peripheral edema I decided to stop his Lasix at this time. He needs long-term follow-up with both cardiology and his primary physician. We will see him again on a when necessary basis for any surgically related issues or at request.        Alyn Riedinger E, PA-C 01/09/2017 3:09 PM

## 2017-01-09 NOTE — Patient Instructions (Signed)
Discussed activity advancement per routine.

## 2017-01-11 ENCOUNTER — Ambulatory Visit (INDEPENDENT_AMBULATORY_CARE_PROVIDER_SITE_OTHER): Payer: Medicare Other

## 2017-01-11 DIAGNOSIS — Z951 Presence of aortocoronary bypass graft: Secondary | ICD-10-CM | POA: Diagnosis not present

## 2017-01-11 LAB — EXERCISE TOLERANCE TEST
CHL CUP MPHR: 152 {beats}/min
CHL CUP STRESS STAGE 1 GRADE: 0 %
CHL CUP STRESS STAGE 2 SPEED: 0 mph
CHL CUP STRESS STAGE 3 GRADE: 0 %
CHL CUP STRESS STAGE 3 HR: 74 {beats}/min
CHL CUP STRESS STAGE 3 SPEED: 1 mph
CHL CUP STRESS STAGE 6 DBP: 69 mmHg
CHL CUP STRESS STAGE 6 GRADE: 0 %
CHL CUP STRESS STAGE 6 HR: 76 {beats}/min
CHL CUP STRESS STAGE 6 SBP: 156 mmHg
CHL CUP STRESS STAGE 6 SPEED: 0 mph
CHL CUP STRESS STAGE 7 DBP: 76 mmHg
CHL CUP STRESS STAGE 7 SBP: 132 mmHg
CHL CUP STRESS STAGE 7 SPEED: 0 mph
CHL RATE OF PERCEIVED EXERTION: 16
CSEPED: 1 min
CSEPEDS: 39 s
CSEPEW: 3.9 METS
CSEPHR: 59 %
Peak HR: 89 {beats}/min
Percent of predicted max HR: 58 %
Rest HR: 68 {beats}/min
Stage 1 DBP: 76 mmHg
Stage 1 HR: 75 {beats}/min
Stage 1 SBP: 136 mmHg
Stage 1 Speed: 0 mph
Stage 2 Grade: 0 %
Stage 2 HR: 76 {beats}/min
Stage 4 Grade: 0.1 %
Stage 4 HR: 74 {beats}/min
Stage 4 Speed: 1 mph
Stage 5 Grade: 10 %
Stage 5 HR: 89 {beats}/min
Stage 5 Speed: 1.7 mph
Stage 7 Grade: 0 %
Stage 7 HR: 68 {beats}/min

## 2017-01-12 NOTE — Progress Notes (Signed)
Thanks Katy I forwarded this to Carlette so she can schedule the patient.  Thanks!

## 2017-01-16 ENCOUNTER — Encounter: Payer: Self-pay | Admitting: Cardiology

## 2017-01-16 ENCOUNTER — Ambulatory Visit (INDEPENDENT_AMBULATORY_CARE_PROVIDER_SITE_OTHER): Payer: Medicare Other | Admitting: Cardiology

## 2017-01-16 ENCOUNTER — Encounter (INDEPENDENT_AMBULATORY_CARE_PROVIDER_SITE_OTHER): Payer: Self-pay

## 2017-01-16 VITALS — BP 126/78 | HR 69 | Ht 71.0 in | Wt 135.8 lb

## 2017-01-16 DIAGNOSIS — I25110 Atherosclerotic heart disease of native coronary artery with unstable angina pectoris: Secondary | ICD-10-CM

## 2017-01-16 DIAGNOSIS — I161 Hypertensive emergency: Secondary | ICD-10-CM

## 2017-01-16 DIAGNOSIS — I2510 Atherosclerotic heart disease of native coronary artery without angina pectoris: Secondary | ICD-10-CM

## 2017-01-16 DIAGNOSIS — Z951 Presence of aortocoronary bypass graft: Secondary | ICD-10-CM | POA: Diagnosis not present

## 2017-01-16 MED ORDER — NITROGLYCERIN 0.4 MG SL SUBL: 0.4000 mg | SUBLINGUAL_TABLET | SUBLINGUAL | 3 refills | Status: DC | PRN

## 2017-01-16 NOTE — Progress Notes (Signed)
01/16/2017 Shawn Collins   [birth year]  [id number]  Primary Physician Nolene Ebbs, MD Primary Cardiologist: Dr. Radford Pax    Reason for Visit/CC: F/u for CAD s/p CABG 09/2016  HPI:  Shawn Collins is a 66 y.o. male who presents to clinic today for f/u for CAD. He was admitted several months ago in May for chest pain and ruled in for NSTEMI. Subsequent LHC, performed by Dr. Martinique, showed severe 3VD. He underwent CABG x 3 with  LIMA to LAD, SVG to OM1/OM2 and SVG D1 by Dr. Servando Snare on 10/26/16. LVEF was normal. Post operatively, he had bilateral pleural effusions and volume overload, treated with Lasix. He was recently seen in f/u by CT surgery and CXR showed resolution of bilateral pleural effusions and he had no dyspnea and was euvolemic. His Lasix was discontinued. His other medical problems included CKD, DM, HTN and HLD. Of note, patient also had a GXT 01/11/17 to screen for need for cardiac rehab. He had poor exercise tolerance, only achieving 3.9 mets. Dr. Radford Pax has therefore recommended that he be enrolled in cardiac rehab.   He presents to clinic today for f/u. It appears that he has not been seen by general cardiology since his discharge in early June. He reports that he has done well. He denies any anginal symptoms. No dyspnea, palpitations, orthopnea, PND, LEE, dizziness, syncope/ near syncope. He reports med compliance but notes that he occasionally forgets to take his statin in the evenings. He is not fasting today. He denies tobacco use. EKG shows NSR. HR is 69 bpm. BP is well controlled at 126/78.   Current Meds  Medication Sig  . amLODipine (NORVASC) 10 MG tablet Take 1 tablet (10 mg total) by mouth daily.  Marland Kitchen aspirin EC 325 MG EC tablet Take 1 tablet (325 mg total) by mouth daily.  . Insulin Glargine (TOUJEO SOLOSTAR) 300 UNIT/ML SOPN Inject 20 Units into the skin at bedtime.  . metoprolol tartrate (LOPRESSOR) 25 MG tablet Take 0.5 tablets (12.5 mg total) by mouth 2 (two) times daily.   . rosuvastatin (CRESTOR) 10 MG tablet Take 1 tablet (10 mg total) by mouth daily at 6 PM.   Allergies  Allergen Reactions  . No Known Allergies    Past Medical History:  Diagnosis Date  . CKD (chronic kidney disease) stage 3, GFR 30-59 ml/min 09/2016  . Diabetes mellitus without complication ([place])   . Emphysema lung ([place])   . Hyperlipidemia   . Hypertension    Family History  Problem Relation Age of Onset  . Diabetes Mellitus II Mother   . Stroke Father   . Hypertension Father   . Diabetes Mellitus II Sister    Past Surgical History:  Procedure Laterality Date  . CORONARY ARTERY BYPASS GRAFT N/A 10/26/2016   Procedure: CORONARY ARTERY BYPASS GRAFTING (CABG) x 4 USING LEFT INTERNAL MAMMARY ARTERY TO LAD AND ENDOSCOPICALLY HARVESTED GREATER SAPHENOUS VEIN TO OM 1, 2 AND TO DIAG;  Surgeon: Grace Isaac, MD;  Location: [facility];  Service: Open Heart Surgery;  Laterality: N/A;  . ENDOVEIN HARVEST OF GREATER SAPHENOUS VEIN Right 10/26/2016   Procedure: ENDOVEIN HARVEST OF GREATER SAPHENOUS VEIN;  Surgeon: Grace Isaac, MD;  Location: [facility];  Service: Open Heart Surgery;  Laterality: Right;  . IR THORACENTESIS ASP PLEURAL SPACE W/IMG GUIDE  11/03/2016  . LEFT HEART CATH AND CORONARY ANGIOGRAPHY N/A 10/21/2016   Procedure: Left Heart Cath and Coronary Angiography;  Surgeon: Martinique, Peter M, MD;  Location: [facility]  INVASIVE CV LAB;  Service: Cardiovascular;  Laterality: N/A;  . TEE WITHOUT CARDIOVERSION N/A 10/26/2016   Procedure: TRANSESOPHAGEAL ECHOCARDIOGRAM (TEE);  Surgeon: Grace Isaac, MD;  Location: [facility];  Service: Open Heart Surgery;  Laterality: N/A;   Social History   Social History  . Marital status: Single    Spouse name: N/A  . Number of children: N/A  . Years of education: N/A   Occupational History  . Not on file.   Social History Main Topics  . Smoking status: Former Smoker    Packs/day: 0.25    Years: 8.00    Types: Cigarettes    Quit date: 15  .  Smokeless tobacco: Never Used  . Alcohol use No  . Drug use: No  . Sexual activity: Not on file   Other Topics Concern  . Not on file   Social History Narrative  . No narrative on file     Review of Systems: General: negative for chills, fever, night sweats or weight changes.  Cardiovascular: negative for chest pain, dyspnea on exertion, edema, orthopnea, palpitations, paroxysmal nocturnal dyspnea or shortness of breath Dermatological: negative for rash Respiratory: negative for cough or wheezing Urologic: negative for hematuria Abdominal: negative for nausea, vomiting, diarrhea, bright red blood per rectum, melena, or hematemesis Neurologic: negative for visual changes, syncope, or dizziness All other systems reviewed and are otherwise negative except as noted above.   Physical Exam:  Blood pressure 126/78, pulse 69, height 5\' 11"  (1.803 m), weight 135 lb 12.8 oz (61.6 kg).  General appearance: alert, cooperative and no distress Neck: no carotid bruit and no JVD Lungs: clear to auscultation bilaterally Heart: regular rate and rhythm, S1, S2 normal, no murmur, click, rub or gallop Extremities: extremities normal, atraumatic, no cyanosis or edema Pulses: 2+ and symmetric Skin: Skin color, texture, turgor normal. No rashes or lesions Neurologic: Grossly normal  EKG NSR 69 bpm -- personally reviewed   ASSESSMENT AND PLAN:   1. CAD: recent NSTEMI 09/2016 with LHC showing severe 3VD, s/p CABG x 3 by Dr. Servando Snare 10/26/16 w/ LIMA to LAD SVG to OM1/OM2 and SVG D1. LVEF normal. Stable w/o dyspnea or recurrent CP. Continue ASA, BB and statin. Also on amlodipine for BP. No ACE/ARB 2/2 CKD. Plan to start cardiac rehab. Will given Rx for PRN SL NTG.   2. HLD: LDL during hospitalization 09/2014 showed elevated LDL at 100 mg/dL. He was discharged home on Crestor 10 mg.  He is due for f/u FLP and HFTs, but not fasting today. He will return on another day for fasting labs. Goal LDL is <70 mg/dL.  If not at goal, we will need to increase Crestor to 20 mg.   3. HTN: controlled on current regimen at 126/78. Continue BB and amlodipine. No ACE/ARB given CKD.   4. DM: on Insulin. Per PCP.   5. CKD: Baseline SCr ~2.    PLAN : start cardiac rehab. F/u with Dr. Radford Pax in 3-4 months.    Sabatino Williard Ladoris Gene, MHS [facility] 01/16/2017 9:40 AM

## 2017-01-16 NOTE — Patient Instructions (Signed)
Medication Instructions:  Your physician has recommended you make the following change in your medication:  1.  START Nitroglycerin 0.4 s/l tablets USE AS DIRECTED ON THE BOTTLE   Labwork: WITHIN 1 WEEK:  FASTING LIPID & LFT  Testing/Procedures: None ordered  Follow-Up: Your physician recommends that you schedule a follow-up appointment in: [place] DR. Radford Pax  You have been referred to [facility].  THEY WILL CONTACT YOU WITH AN APPOINTMENT.   Any Other Special Instructions Will Be Listed Below (If Applicable).     If you need a refill on your cardiac medications before your next appointment, please call your pharmacy.

## 2017-01-17 ENCOUNTER — Other Ambulatory Visit: Payer: Medicare Other | Admitting: *Deleted

## 2017-01-17 DIAGNOSIS — I161 Hypertensive emergency: Secondary | ICD-10-CM

## 2017-01-17 DIAGNOSIS — Z951 Presence of aortocoronary bypass graft: Secondary | ICD-10-CM

## 2017-01-17 DIAGNOSIS — I25110 Atherosclerotic heart disease of native coronary artery with unstable angina pectoris: Secondary | ICD-10-CM

## 2017-01-17 LAB — LIPID PANEL
CHOL/HDL RATIO: 3.5 ratio (ref 0.0–5.0)
Cholesterol, Total: 148 mg/dL (ref 100–199)
HDL: 42 mg/dL (ref >39)
LDL CALC: 82 mg/dL (ref 0–99)
TRIGLYCERIDES: 122 mg/dL (ref 0–149)
VLDL CHOLESTEROL CAL: 24 mg/dL (ref 5–40)

## 2017-01-17 LAB — HEPATIC FUNCTION PANEL
ALBUMIN: 4.1 g/dL (ref 3.6–4.8)
ALT: 27 IU/L (ref 0–44)
AST: 26 IU/L (ref 0–40)
Alkaline Phosphatase: 67 IU/L (ref 39–117)
Bilirubin Total: 0.3 mg/dL (ref 0.0–1.2)
Bilirubin, Direct: 0.09 mg/dL (ref 0.00–0.40)
TOTAL PROTEIN: 7.5 g/dL (ref 6.0–8.5)

## 2017-01-18 ENCOUNTER — Other Ambulatory Visit: Payer: Medicare Other

## 2017-01-19 ENCOUNTER — Telehealth: Payer: Self-pay | Admitting: *Deleted

## 2017-01-19 MED ORDER — ROSUVASTATIN CALCIUM 10 MG PO TABS: 10.0000 mg | ORAL_TABLET | Freq: Every day | ORAL | 3 refills | Status: DC

## 2017-01-19 NOTE — Telephone Encounter (Signed)
-----   Message from [place], [place] sent at 01/18/2017  5:10 PM EDT ----- Cholesterol is controlled and liver function is normal. Continue statin medication for CAD. No changes.

## 2017-02-01 ENCOUNTER — Telehealth (HOSPITAL_COMMUNITY): Payer: Self-pay

## 2017-02-01 NOTE — Telephone Encounter (Signed)
I called and left message on voicemail to call office about scheduling for cardiac rehab. I left office contact information on patient voicemail to return call.  ° °

## 2017-02-09 ENCOUNTER — Telehealth (HOSPITAL_COMMUNITY): Payer: Self-pay | Admitting: Pharmacist

## 2017-02-09 NOTE — Telephone Encounter (Signed)
Cardiac Rehab Medication Review by a Pharmacist  Does the patient  feel that his/her medications are working for him/her?  yes  Has the patient been experiencing any side effects to the medications prescribed?  no  Does the patient measure his/her own blood pressure or blood glucose at home?  Yes, checks blood sugar at night and in the morning --200s at night and in the mornings around 90   Does the patient have any problems obtaining medications due to transportation or finances?   no  Understanding of regimen: fair Understanding of indications: fair Potential of compliance: good    Pharmacist comments: Shawn Collins is a 66 y.o. male who seems in good spirits and health over the phone today. It was quite difficut to obtain a medication history for him as he just takes the medications and is not able to clearly determine the names (he said pronunciation is hard and we had a hard time communicating through spelling). Overall, I think we determined that he takes the medications I had listed. The bottle of nitroglycerin that he has is opened and I counseled him that he should obtain a refill for that the next time he goes to his pharmacy. He checks his blood sugar and it is elevated in the evenings and he has had no hypoglycemic events. He does not take his BP at home at this time. He had no questions for me and felt that his medication regimen is appropriate at this time.   Jalene Mullet, Pharm.D. PGY1 Pharmacy Resident 02/09/2017 6:07 PM Main Pharmacy: [phone]

## 2017-02-14 ENCOUNTER — Encounter (HOSPITAL_COMMUNITY)
Admission: RE | Admit: 2017-02-14 | Discharge: 2017-02-14 | Disposition: A | Discharge status: Home / Self Care | Payer: Medicare Other | Source: Ambulatory Visit | Attending: Cardiology | Admitting: Cardiology

## 2017-02-14 VITALS — BP 100/64 | HR 65 | Ht 68.5 in | Wt 138.2 lb

## 2017-02-14 DIAGNOSIS — Z79899 Other long term (current) drug therapy: Secondary | ICD-10-CM | POA: Insufficient documentation

## 2017-02-14 DIAGNOSIS — Z7982 Long term (current) use of aspirin: Secondary | ICD-10-CM | POA: Insufficient documentation

## 2017-02-14 DIAGNOSIS — I214 Non-ST elevation (NSTEMI) myocardial infarction: Secondary | ICD-10-CM | POA: Insufficient documentation

## 2017-02-14 DIAGNOSIS — I129 Hypertensive chronic kidney disease with stage 1 through stage 4 chronic kidney disease, or unspecified chronic kidney disease: Secondary | ICD-10-CM | POA: Insufficient documentation

## 2017-02-14 DIAGNOSIS — N183 Chronic kidney disease, stage 3 (moderate): Secondary | ICD-10-CM | POA: Insufficient documentation

## 2017-02-14 DIAGNOSIS — Z951 Presence of aortocoronary bypass graft: Secondary | ICD-10-CM | POA: Insufficient documentation

## 2017-02-14 DIAGNOSIS — E1122 Type 2 diabetes mellitus with diabetic chronic kidney disease: Secondary | ICD-10-CM | POA: Insufficient documentation

## 2017-02-14 DIAGNOSIS — J439 Emphysema, unspecified: Secondary | ICD-10-CM | POA: Insufficient documentation

## 2017-02-14 DIAGNOSIS — E785 Hyperlipidemia, unspecified: Secondary | ICD-10-CM | POA: Insufficient documentation

## 2017-02-14 DIAGNOSIS — Z87891 Personal history of nicotine dependence: Secondary | ICD-10-CM | POA: Insufficient documentation

## 2017-02-14 NOTE — Progress Notes (Signed)
Shawn Collins 66 y.o. male DOB: January 18, 1951 MRN: [id number]      Nutrition Note  1. NSTEMI (non-ST elevated myocardial infarction) ([place])   2. S/P CABG x 4    Past Medical History:  Diagnosis Date  . CKD (chronic kidney disease) stage 3, GFR 30-59 ml/min 09/2016  . Diabetes mellitus without complication ([place])   . Emphysema lung ([place])   . Hyperlipidemia   . Hypertension    Meds reviewed. Toujeo noted  HT: Ht Readings from Last 1 Encounters:  01/16/17 5\' 11"  (1.803 m)    WT: Wt Readings from Last 3 Encounters:  01/16/17 135 lb 12.8 oz (61.6 kg)  01/09/17 133 lb (60.3 kg)  12/05/16 136 lb (61.7 kg)     BMI 18.9   Current tobacco use? No  Labs:  Lipid Panel     Component Value Date/Time   CHOL 148 01/17/2017 1048   TRIG 122 01/17/2017 1048   HDL 42 01/17/2017 1048   CHOLHDL 3.5 01/17/2017 1048   CHOLHDL 3.3 10/16/2016 0512   VLDL 10 10/16/2016 0512   LDLCALC 82 01/17/2017 1048    Lab Results  Component Value Date   HGBA1C 10.1 (H) 10/16/2016   CBG (last 3)  No results for input(s): GLUCAP in the last 72 hours.  Nutrition Note Spoke with pt. Nutrition plan and goals reviewed with pt. Pt is following Step 1 of the Therapeutic Lifestyle Changes diet. Pt wt is down 23 lb over the past 3 months. Pt wants to gain wt to his UBW of ~145 lb. Pt states he feels his appetite is pretty good and admits to some fear of eating after his heart surgery. Pt is diabetic. Last A1c indicates blood glucose poorly controlled. Per discussion, pt thought his A1c was "7-something." Pt denies difficulty getting/affording/tolerating his insulin. This Probation officer went over Diabetes Education test results. Pt checks CBG's 2-3 times a day. Pt expressed understanding of the information reviewed. Pt aware of nutrition education classes offered and plans on attending nutrition classes.  Nutrition Diagnosis ? Food-and nutrition-related knowledge deficit related to lack of exposure to information as related  to diagnosis of: ? CVD ? DM  Nutrition Intervention ? Pt's individual nutrition plan and goals reviewed with pt.  Nutrition Goal(s):  ? Improved glycemic control as evidenced by pt's A1c trending toward 7 or less.  ? Pt to identify food quantities necessary to achieve weight gain of 6-10 lb at graduation from cardiac rehab. Goal wt of 145 lb desired.  Plan:  Pt to attend nutrition classes ? Nutrition I ? Nutrition II ? Portion Distortion ? Diabetes Blitz ? Diabetes Q & A Will provide client-centered nutrition education as part of interdisciplinary care.   Monitor and evaluate progress toward nutrition goal with team.  Derek Mound, M.Ed, RD, LDN, CDE 02/14/2017 3:17 PM

## 2017-02-14 NOTE — Progress Notes (Signed)
Cardiac Individual Treatment Plan  Patient Details  Name: Dorrance Sellick MRN: [id number] Date of Birth: [birth year] Referring Provider:     CARDIAC REHAB PHASE II ORIENTATION from 02/14/2017 in [place]  Referring Provider  Fransico Him MD      Initial Encounter Date:    CARDIAC REHAB PHASE II ORIENTATION from 02/14/2017 in [place]  Date  02/14/17  Referring Provider  Fransico Him MD      Visit Diagnosis: NSTEMI (non-ST elevated myocardial infarction) ([place])  S/P CABG x 4  Patient's Home Medications on Admission:  Current Outpatient Prescriptions:  .  amLODipine (NORVASC) 10 MG tablet, Take 1 tablet (10 mg total) by mouth daily., Disp: 30 tablet, Rfl: 0 .  aspirin EC 325 MG EC tablet, Take 1 tablet (325 mg total) by mouth daily., Disp: 30 tablet, Rfl: 0 .  Insulin Glargine (TOUJEO SOLOSTAR) 300 UNIT/ML SOPN, Inject 15 Units into the skin at bedtime. , Disp: , Rfl:  .  metoprolol tartrate (LOPRESSOR) 25 MG tablet, Take 0.5 tablets (12.5 mg total) by mouth 2 (two) times daily., Disp: 30 tablet, Rfl: 1 .  nitroGLYCERIN (NITROSTAT) 0.4 MG SL tablet, Place 1 tablet (0.4 mg total) under the tongue every 5 (five) minutes as needed., Disp: 25 tablet, Rfl: 3 .  rosuvastatin (CRESTOR) 10 MG tablet, Take 1 tablet (10 mg total) by mouth daily at 6 PM., Disp: 90 tablet, Rfl: 3  Past Medical History: Past Medical History:  Diagnosis Date  . CKD (chronic kidney disease) stage 3, GFR 30-59 ml/min 09/2016  . Diabetes mellitus without complication ([place])   . Emphysema lung ([place])   . Hyperlipidemia   . Hypertension     Tobacco Use: History  Smoking Status  . Former Smoker  . Packs/day: 0.25  . Years: 8.00  . Types: Cigarettes  . Quit date: 1972  Smokeless Tobacco  . Never Used    Labs: Recent Review Flowsheet Data    Labs for ITP Cardiac and Pulmonary Rehab Latest Ref Rng & Units 10/26/2016 10/26/2016 10/27/2016  10/27/2016 01/17/2017   Cholestrol 100 - 199 mg/dL - - - - 148   LDLCALC 0 - 99 mg/dL - - - - 82   HDL >39 mg/dL - - - - 42   Trlycerides 0 - 149 mg/dL - - - - 122   Hemoglobin A1c 4.8 - 5.6 % - - - - -   PHART 7.350 - 7.450 7.453(H) - 7.377 - -   PCO2ART 32.0 - 48.0 mmHg 29.0(L) - 36.8 - -   HCO3 20.0 - 28.0 mmol/L 20.6 - 21.8 - -   TCO2 0 - 100 mmol/L 22 20 23 21  -   ACIDBASEDEF 0.0 - 2.0 mmol/L 3.0(H) - 3.0(H) - -   O2SAT % 99.0 - 98.0 - -      Capillary Blood Glucose: Lab Results  Component Value Date   GLUCAP 156 (H) 11/04/2016   GLUCAP 120 (H) 11/04/2016   GLUCAP 165 (H) 11/03/2016   GLUCAP 171 (H) 11/03/2016   GLUCAP 169 (H) 11/03/2016     Exercise Target Goals: Date: 02/14/17  Exercise Program Goal: Individual exercise prescription set with THRR, safety & activity barriers. Participant demonstrates ability to understand and report RPE using BORG scale, to self-measure pulse accurately, and to acknowledge the importance of the exercise prescription.  Exercise Prescription Goal: Starting with aerobic activity 30 plus minutes a day, 3 days per week for initial exercise prescription.  Provide home exercise prescription and guidelines that participant acknowledges understanding prior to discharge.  Activity Barriers & Risk Stratification:     Activity Barriers & Cardiac Risk Stratification - 02/14/17 1346      Activity Barriers & Cardiac Risk Stratification   Activity Barriers Shortness of Breath;Muscular Weakness;Deconditioning   Cardiac Risk Stratification High      6 Minute Walk:     6 Minute Walk    Row Name 02/14/17 1608         6 Minute Walk   Phase Initial     Distance 1600 feet     Walk Time 6 minutes     # of Rest Breaks 0     MPH 3.03     METS 3.7     RPE 13     Symptoms No     Resting HR 65 bpm     Resting BP 100/64     Resting Oxygen Saturation  100 %     Exercise Oxygen Saturation  during 6 min walk 100 %     Max Ex. HR 83 bpm     Max  Ex. BP 116/60     2 Minute Post BP 114/70        Oxygen Initial Assessment:   Oxygen Re-Evaluation:   Oxygen Discharge (Final Oxygen Re-Evaluation):   Initial Exercise Prescription:     Initial Exercise Prescription - 02/14/17 1600      Date of Initial Exercise RX and Referring Provider   Date 02/14/17   Referring Provider Fransico Him MD     Treadmill   MPH 2.5   Grade 0   Minutes 15   METs 2.91     Bike   Level 0.5   Minutes 15   METs 2.45     NuStep   Level 3   SPM 80   Minutes 15   METs 2     Prescription Details   Frequency (times per week) 3   Duration Progress to 30 minutes of continuous aerobic without signs/symptoms of physical distress     Intensity   THRR 40-80% of Max Heartrate 61-122   Ratings of Perceived Exertion 11-13   Perceived Dyspnea 0-4     Progression   Progression Continue to progress workloads to maintain intensity without signs/symptoms of physical distress.     Resistance Training   Training Prescription Yes   Weight 2lbs   Reps 10-15      Perform Capillary Blood Glucose checks as needed.  Exercise Prescription Changes:   Exercise Comments:   Exercise Goals and Review:     Exercise Goals    Row Name 02/14/17 1348             Exercise Goals   Increase Physical Activity Yes       Intervention Provide advice, education, support and counseling about physical activity/exercise needs.;Develop an individualized exercise prescription for aerobic and resistive training based on initial evaluation findings, risk stratification, comorbidities and participant's personal goals.       Expected Outcomes Achievement of increased cardiorespiratory fitness and enhanced flexibility, muscular endurance and strength shown through measurements of functional capacity and personal statement of participant.       Increase Strength and Stamina Yes  improve walking tolerance, be able to climb stairs without SOB and improve muscle mass        Intervention Provide advice, education, support and counseling about physical activity/exercise needs.;Develop an individualized exercise prescription for aerobic and resistive training  based on initial evaluation findings, risk stratification, comorbidities and participant's personal goals.       Expected Outcomes Achievement of increased cardiorespiratory fitness and enhanced flexibility, muscular endurance and strength shown through measurements of functional capacity and personal statement of participant.       Able to understand and use rate of perceived exertion (RPE) scale Yes       Intervention Provide education and explanation on how to use RPE scale       Expected Outcomes Short Term: Able to use RPE daily in rehab to express subjective intensity level;Long Term:  Able to use RPE to guide intensity level when exercising independently       Knowledge and understanding of Target Heart Rate Range (THRR) Yes       Intervention Provide education and explanation of THRR including how the numbers were predicted and where they are located for reference       Expected Outcomes Short Term: Able to state/look up THRR;Long Term: Able to use THRR to govern intensity when exercising independently;Short Term: Able to use daily as guideline for intensity in rehab       Able to check pulse independently Yes       Intervention Provide education and demonstration on how to check pulse in carotid and radial arteries.;Review the importance of being able to check your own pulse for safety during independent exercise       Expected Outcomes Short Term: Able to explain why pulse checking is important during independent exercise;Long Term: Able to check pulse independently and accurately       Understanding of Exercise Prescription Yes       Intervention Provide education, explanation, and written materials on patient's individual exercise prescription       Expected Outcomes Short Term: Able to explain program  exercise prescription;Long Term: Able to explain home exercise prescription to exercise independently          Exercise Goals Re-Evaluation :    Discharge Exercise Prescription (Final Exercise Prescription Changes):   Nutrition:  Target Goals: Understanding of nutrition guidelines, daily intake of sodium 1500mg , cholesterol 200mg , calories 30% from fat and 7% or less from saturated fats, daily to have 5 or more servings of fruits and vegetables.  Biometrics:     Pre Biometrics - 02/14/17 1614      Pre Biometrics   Height 5' 8.5" (1.74 m)   Weight 138 lb 3.7 oz (62.7 kg)   Waist Circumference 30.5 inches   Hip Circumference 35.5 inches   Waist to Hip Ratio 0.86 %   BMI (Calculated) 20.71   Triceps Skinfold 222 mm   % Body Fat 22.1 %   Grip Strength 37 kg   Flexibility 8.25 in   Single Leg Stand 5.75 seconds       Nutrition Therapy Plan and Nutrition Goals:     Nutrition Therapy & Goals - 02/14/17 1526      Nutrition Therapy   Diet Carb Modified, Therapeutic Lifestyle Changes     Personal Nutrition Goals   Nutrition Goal Improved glycemic control as evidenced by pt's A1c trending toward 7 or less.    Personal Goal #2 Pt to identify food quantities necessary to achieve weight gain of 6-10 lb at graduation from cardiac rehab. Goal wt of 145 lb desired.      Intervention Plan   Intervention Prescribe, educate and counsel regarding individualized specific dietary modifications aiming towards targeted core components such as weight, hypertension, lipid management, diabetes,  heart failure and other comorbidities.   Expected Outcomes Short Term Goal: Understand basic principles of dietary content, such as calories, fat, sodium, cholesterol and nutrients.;Long Term Goal: Adherence to prescribed nutrition plan.      Nutrition Discharge: Nutrition Scores:     Nutrition Assessments - 02/14/17 1515      MEDFICTS Scores   Pre Score 40      Nutrition Goals  Re-Evaluation:   Nutrition Goals Re-Evaluation:   Nutrition Goals Discharge (Final Nutrition Goals Re-Evaluation):   Psychosocial: Target Goals: Acknowledge presence or absence of significant depression and/or stress, maximize coping skills, provide positive support system. Participant is able to verbalize types and ability to use techniques and skills needed for reducing stress and depression.  Initial Review & Psychosocial Screening:     Initial Psych Review & Screening - 02/14/17 1536      Initial Review   Current issues with None Identified     Family Dynamics   Good Support System? Yes  family friends    Comments upon brief assessment, no psychosocial needs identified, no interventions necessary      Barriers   Psychosocial barriers to participate in program There are no identifiable barriers or psychosocial needs.     Screening Interventions   Interventions Encouraged to exercise      Quality of Life Scores:     Quality of Life - 02/14/17 1535      Quality of Life Scores   Health/Function Pre 20.53 %   Socioeconomic Pre 23.69 %   Psych/Spiritual Pre 23.43 %   Family Pre 22.3 %   GLOBAL Pre 22.09 %      PHQ-9: Recent Review Flowsheet Data    There is no flowsheet data to display.     Interpretation of Total Score  Total Score Depression Severity:  1-4 = Minimal depression, 5-9 = Mild depression, 10-14 = Moderate depression, 15-19 = Moderately severe depression, 20-27 = Severe depression   Psychosocial Evaluation and Intervention:   Psychosocial Re-Evaluation:   Psychosocial Discharge (Final Psychosocial Re-Evaluation):   Vocational Rehabilitation: Provide vocational rehab assistance to qualifying candidates.   Vocational Rehab Evaluation & Intervention:     Vocational Rehab - 02/14/17 1536      Initial Vocational Rehab Evaluation & Intervention   Assessment shows need for Vocational Rehabilitation Yes      Education: Education Goals:  Education classes will be provided on a weekly basis, covering required topics. Participant will state understanding/return demonstration of topics presented.  Learning Barriers/Preferences:     Learning Barriers/Preferences - 02/14/17 1346      Learning Barriers/Preferences   Learning Barriers Sight   Learning Preferences Written Material      Education Topics: Count Your Pulse:  -Group instruction provided by verbal instruction, demonstration, patient participation and written materials to support subject.  Instructors address importance of being able to find your pulse and how to count your pulse when at home without a heart monitor.  Patients get hands on experience counting their pulse with staff help and individually.   Heart Attack, Angina, and Risk Factor Modification:  -Group instruction provided by verbal instruction, video, and written materials to support subject.  Instructors address signs and symptoms of angina and heart attacks.    Also discuss risk factors for heart disease and how to make changes to improve heart health risk factors.   Functional Fitness:  -Group instruction provided by verbal instruction, demonstration, patient participation, and written materials to support subject.  Instructors address safety  measures for doing things around the house.  Discuss how to get up and down off the floor, how to pick things up properly, how to safely get out of a chair without assistance, and balance training.   Meditation and Mindfulness:  -Group instruction provided by verbal instruction, patient participation, and written materials to support subject.  Instructor addresses importance of mindfulness and meditation practice to help reduce stress and improve awareness.  Instructor also leads participants through a meditation exercise.    Stretching for Flexibility and Mobility:  -Group instruction provided by verbal instruction, patient participation, and written materials  to support subject.  Instructors lead participants through series of stretches that are designed to increase flexibility thus improving mobility.  These stretches are additional exercise for major muscle groups that are typically performed during regular warm up and cool down.   Hands Only CPR:  -Group verbal, video, and participation provides a basic overview of AHA guidelines for community CPR. Role-play of emergencies allow participants the opportunity to practice calling for help and chest compression technique with discussion of AED use.   Hypertension: -Group verbal and written instruction that provides a basic overview of hypertension including the most recent diagnostic guidelines, risk factor reduction with self-care instructions and medication management.    Nutrition I class: Heart Healthy Eating:  -Group instruction provided by PowerPoint slides, verbal discussion, and written materials to support subject matter. The instructor gives an explanation and review of the Therapeutic Lifestyle Changes diet recommendations, which includes a discussion on lipid goals, dietary fat, sodium, fiber, plant stanol/sterol esters, sugar, and the components of a well-balanced, healthy diet.   Nutrition II class: Lifestyle Skills:  -Group instruction provided by PowerPoint slides, verbal discussion, and written materials to support subject matter. The instructor gives an explanation and review of label reading, grocery shopping for heart health, heart healthy recipe modifications, and ways to make healthier choices when eating out.   Diabetes Question & Answer:  -Group instruction provided by PowerPoint slides, verbal discussion, and written materials to support subject matter. The instructor gives an explanation and review of diabetes co-morbidities, pre- and post-prandial blood glucose goals, pre-exercise blood glucose goals, signs, symptoms, and treatment of hypoglycemia and hyperglycemia, and foot  care basics.   Diabetes Blitz:  -Group instruction provided by PowerPoint slides, verbal discussion, and written materials to support subject matter. The instructor gives an explanation and review of the physiology behind type 1 and type 2 diabetes, diabetes medications and rational behind using different medications, pre- and post-prandial blood glucose recommendations and Hemoglobin A1c goals, diabetes diet, and exercise including blood glucose guidelines for exercising safely.    Portion Distortion:  -Group instruction provided by PowerPoint slides, verbal discussion, written materials, and food models to support subject matter. The instructor gives an explanation of serving size versus portion size, changes in portions sizes over the last 20 years, and what consists of a serving from each food group.   Stress Management:  -Group instruction provided by verbal instruction, video, and written materials to support subject matter.  Instructors review role of stress in heart disease and how to cope with stress positively.     Exercising on Your Own:  -Group instruction provided by verbal instruction, power point, and written materials to support subject.  Instructors discuss benefits of exercise, components of exercise, frequency and intensity of exercise, and end points for exercise.  Also discuss use of nitroglycerin and activating EMS.  Review options of places to exercise outside of rehab.  Review guidelines for sex with heart disease.   Cardiac Drugs I:  -Group instruction provided by verbal instruction and written materials to support subject.  Instructor reviews cardiac drug classes: antiplatelets, anticoagulants, beta blockers, and statins.  Instructor discusses reasons, side effects, and lifestyle considerations for each drug class.   Cardiac Drugs II:  -Group instruction provided by verbal instruction and written materials to support subject.  Instructor reviews cardiac drug classes:  angiotensin converting enzyme inhibitors (ACE-I), angiotensin II receptor blockers (ARBs), nitrates, and calcium channel blockers.  Instructor discusses reasons, side effects, and lifestyle considerations for each drug class.   Anatomy and Physiology of the Circulatory System:  Group verbal and written instruction and models provide basic cardiac anatomy and physiology, with the coronary electrical and arterial systems. Review of: AMI, Angina, Valve disease, Heart Failure, Peripheral Artery Disease, Cardiac Arrhythmia, Pacemakers, and the ICD.   Other Education:  -Group or individual verbal, written, or video instructions that support the educational goals of the cardiac rehab program.   Knowledge Questionnaire Score:     Knowledge Questionnaire Score - 02/14/17 1535      Knowledge Questionnaire Score   Pre Score 18/24      Core Components/Risk Factors/Patient Goals at Admission:     Personal Goals and Risk Factors at Admission - 02/14/17 1615      Core Components/Risk Factors/Patient Goals on Admission   Diabetes Yes   Intervention Provide education about signs/symptoms and action to take for hypo/hyperglycemia.;Provide education about proper nutrition, including hydration, and aerobic/resistive exercise prescription along with prescribed medications to achieve blood glucose in normal ranges: Fasting glucose 65-99 mg/dL   Expected Outcomes Short Term: Participant verbalizes understanding of the signs/symptoms and immediate care of hyper/hypoglycemia, proper foot care and importance of medication, aerobic/resistive exercise and nutrition plan for blood glucose control.;Long Term: Attainment of HbA1C < 7%.   Hypertension Yes   Intervention Provide education on lifestyle modifcations including regular physical activity/exercise, weight management, moderate sodium restriction and increased consumption of fresh fruit, vegetables, and low fat dairy, alcohol moderation, and smoking  cessation.;Monitor prescription use compliance.   Expected Outcomes Short Term: Continued assessment and intervention until BP is < 140/73mm HG in hypertensive participants. < 130/41mm HG in hypertensive participants with diabetes, heart failure or chronic kidney disease.;Long Term: Maintenance of blood pressure at goal levels.   Lipids Yes   Intervention Provide education and support for participant on nutrition & aerobic/resistive exercise along with prescribed medications to achieve LDL 70mg , HDL >40mg .   Expected Outcomes Short Term: Participant states understanding of desired cholesterol values and is compliant with medications prescribed. Participant is following exercise prescription and nutrition guidelines.;Long Term: Cholesterol controlled with medications as prescribed, with individualized exercise RX and with personalized nutrition plan. Value goals: LDL < 70mg , HDL > 40 mg.      Core Components/Risk Factors/Patient Goals Review:    Core Components/Risk Factors/Patient Goals at Discharge (Final Review):    ITP Comments:     ITP Comments    Row Name 02/14/17 1345           ITP Comments Medical Director, Dr. Fransico Him          Comments: Patient attended orientation from 1334 to 1444 to review rules and guidelines for program. Completed 6 minute walk test, Intitial ITP, and exercise prescription.  VSS. Telemetry-sinus rhythm.   Asymptomatic.

## 2017-02-20 ENCOUNTER — Encounter (HOSPITAL_COMMUNITY): Payer: Medicare Other

## 2017-02-20 ENCOUNTER — Encounter (HOSPITAL_COMMUNITY): Payer: Self-pay

## 2017-02-21 ENCOUNTER — Other Ambulatory Visit: Payer: Self-pay | Admitting: *Deleted

## 2017-02-22 ENCOUNTER — Encounter (HOSPITAL_COMMUNITY)
Admission: RE | Admit: 2017-02-22 | Discharge: 2017-02-22 | Disposition: A | Discharge status: Home / Self Care | Payer: Medicare Other | Source: Ambulatory Visit | Attending: Cardiology | Admitting: Cardiology

## 2017-02-22 ENCOUNTER — Encounter (HOSPITAL_COMMUNITY): Payer: Self-pay

## 2017-02-22 DIAGNOSIS — Z7982 Long term (current) use of aspirin: Secondary | ICD-10-CM | POA: Diagnosis not present

## 2017-02-22 DIAGNOSIS — I214 Non-ST elevation (NSTEMI) myocardial infarction: Secondary | ICD-10-CM | POA: Diagnosis not present

## 2017-02-22 DIAGNOSIS — Z951 Presence of aortocoronary bypass graft: Secondary | ICD-10-CM

## 2017-02-22 DIAGNOSIS — Z79899 Other long term (current) drug therapy: Secondary | ICD-10-CM | POA: Diagnosis not present

## 2017-02-22 DIAGNOSIS — Z87891 Personal history of nicotine dependence: Secondary | ICD-10-CM | POA: Diagnosis not present

## 2017-02-22 DIAGNOSIS — J439 Emphysema, unspecified: Secondary | ICD-10-CM | POA: Diagnosis not present

## 2017-02-22 DIAGNOSIS — E1122 Type 2 diabetes mellitus with diabetic chronic kidney disease: Secondary | ICD-10-CM | POA: Diagnosis not present

## 2017-02-22 DIAGNOSIS — I129 Hypertensive chronic kidney disease with stage 1 through stage 4 chronic kidney disease, or unspecified chronic kidney disease: Secondary | ICD-10-CM | POA: Diagnosis not present

## 2017-02-22 DIAGNOSIS — N183 Chronic kidney disease, stage 3 (moderate): Secondary | ICD-10-CM | POA: Diagnosis not present

## 2017-02-22 DIAGNOSIS — E785 Hyperlipidemia, unspecified: Secondary | ICD-10-CM | POA: Diagnosis not present

## 2017-02-22 LAB — GLUCOSE, CAPILLARY
GLUCOSE-CAPILLARY: 118 mg/dL — AB (ref 65–99)
Glucose-Capillary: 177 mg/dL — ABNORMAL HIGH (ref 65–99)

## 2017-02-22 NOTE — Progress Notes (Signed)
Daily Session Note  Patient Details  Name: Shawn Collins MRN: [id number] Date of Birth: [birth year] Referring Provider:     CARDIAC REHAB PHASE II ORIENTATION from 02/14/2017 in [place]  Referring Provider  Fransico Him MD      Encounter Date: 02/22/2017  Check In:     Session Check In - 02/22/17 1351      Check-In   Location [facility]   Staff Present Amber Fair, MS, ACSM RCEP, Exercise Physiologist;Olinty Bonham, MS, ACSM CEP, Exercise Physiologist;Maria Whitaker, RN, BSN;Deicy Rusk, RN, BSN   Supervising physician immediately available to respond to emergencies [facility] immediately available   Physician(s) Dr. Wendee Beavers   Medication changes reported     No   Fall or balance concerns reported    No   Tobacco Cessation No Change   Warm-up and Cool-down Performed as group-led instruction   Resistance Training Performed No   VAD Patient? No     Pain Assessment   Currently in Pain? No/denies   Multiple Pain Sites No      Capillary Blood Glucose: Results for orders placed or performed during the hospital encounter of 02/22/17 (from the past 24 hour(s))  Glucose, capillary     Status: Abnormal   Collection Time: 02/22/17  1:25 PM  Result Value Ref Range   Glucose-Capillary 177 (H) 65 - 99 mg/dL  Glucose, capillary     Status: Abnormal   Collection Time: 02/22/17  2:26 PM  Result Value Ref Range   Glucose-Capillary 118 (H) 65 - 99 mg/dL        Exercise Prescription Changes - 02/22/17 1500      Response to Exercise   Blood Pressure (Admit) 130/72   Blood Pressure (Exercise) 134/60   Blood Pressure (Exit) 130/82   Heart Rate (Admit) 95 bpm   Heart Rate (Exercise) 103 bpm   Heart Rate (Exit) 73 bpm   Rating of Perceived Exertion (Exercise) 13   Symptoms fatigue/deconditioned   Comments encourage pt to take rest breaks   Duration Progress to 30 minutes of  aerobic without signs/symptoms of physical distress    Intensity THRR unchanged     Progression   Progression Continue to progress workloads to maintain intensity without signs/symptoms of physical distress.   Average METs 2.8     Resistance Training   Training Prescription No     Treadmill   MPH 2.5   Grade 0   Minutes 15   METs 2.91     NuStep   Level 3   SPM 80   Minutes 15   METs 2.8      History  Smoking Status  . Former Smoker  . Packs/day: 0.25  . Years: 8.00  . Types: Cigarettes  . Quit date: 1972  Smokeless Tobacco  . Never Used    Goals Met:  Exercise tolerated well  Goals Unmet:  Not Applicable  Comments: Pt started cardiac rehab today.  Pt tolerated light exercise without difficulty. VSS, telemetry-sinus rhythm, asymptomatic.  Medication list reconciled. Pt denies barriers to medicaiton compliance.  PSYCHOSOCIAL ASSESSMENT:  PHQ-0. Pt exhibits positive coping skills, hopeful outlook with supportive family. No psychosocial needs identified at this time, no psychosocial interventions necessary.    Pt enjoys playing golf at [employer].  Pt goals for cardaic rehab are to increase strength/stamina and improve walking ability.   Pt oriented to exercise equipment and routine.    Understanding verbalized.   Dr. Fransico Him is  Medical Director for Cardiac Rehab at [facility].

## 2017-02-24 ENCOUNTER — Encounter (HOSPITAL_COMMUNITY)
Admission: RE | Admit: 2017-02-24 | Discharge: 2017-02-24 | Disposition: A | Discharge status: Home / Self Care | Payer: Medicare Other | Source: Ambulatory Visit | Attending: Cardiology | Admitting: Cardiology

## 2017-02-24 DIAGNOSIS — I214 Non-ST elevation (NSTEMI) myocardial infarction: Secondary | ICD-10-CM | POA: Diagnosis not present

## 2017-02-24 DIAGNOSIS — Z951 Presence of aortocoronary bypass graft: Secondary | ICD-10-CM

## 2017-02-24 LAB — GLUCOSE, CAPILLARY: Glucose-Capillary: 191 mg/dL — ABNORMAL HIGH (ref 65–99)

## 2017-02-27 ENCOUNTER — Encounter (HOSPITAL_COMMUNITY)
Admission: RE | Admit: 2017-02-27 | Discharge: 2017-02-27 | Disposition: A | Discharge status: Home / Self Care | Payer: Medicare Other | Source: Ambulatory Visit | Attending: Cardiology | Admitting: Cardiology

## 2017-02-27 DIAGNOSIS — N183 Chronic kidney disease, stage 3 (moderate): Secondary | ICD-10-CM | POA: Insufficient documentation

## 2017-02-27 DIAGNOSIS — J439 Emphysema, unspecified: Secondary | ICD-10-CM | POA: Insufficient documentation

## 2017-02-27 DIAGNOSIS — I129 Hypertensive chronic kidney disease with stage 1 through stage 4 chronic kidney disease, or unspecified chronic kidney disease: Secondary | ICD-10-CM | POA: Insufficient documentation

## 2017-02-27 DIAGNOSIS — Z87891 Personal history of nicotine dependence: Secondary | ICD-10-CM | POA: Insufficient documentation

## 2017-02-27 DIAGNOSIS — Z951 Presence of aortocoronary bypass graft: Secondary | ICD-10-CM | POA: Diagnosis present

## 2017-02-27 DIAGNOSIS — Z79899 Other long term (current) drug therapy: Secondary | ICD-10-CM | POA: Insufficient documentation

## 2017-02-27 DIAGNOSIS — E785 Hyperlipidemia, unspecified: Secondary | ICD-10-CM | POA: Insufficient documentation

## 2017-02-27 DIAGNOSIS — I214 Non-ST elevation (NSTEMI) myocardial infarction: Secondary | ICD-10-CM | POA: Insufficient documentation

## 2017-02-27 DIAGNOSIS — Z7982 Long term (current) use of aspirin: Secondary | ICD-10-CM | POA: Insufficient documentation

## 2017-02-27 DIAGNOSIS — E1122 Type 2 diabetes mellitus with diabetic chronic kidney disease: Secondary | ICD-10-CM | POA: Diagnosis not present

## 2017-02-27 LAB — GLUCOSE, CAPILLARY
GLUCOSE-CAPILLARY: 196 mg/dL — AB (ref 65–99)
GLUCOSE-CAPILLARY: 151 mg/dL — AB (ref 65–99)

## 2017-03-01 ENCOUNTER — Encounter (HOSPITAL_COMMUNITY)
Admission: RE | Admit: 2017-03-01 | Discharge: 2017-03-01 | Disposition: A | Discharge status: Home / Self Care | Payer: Medicare Other | Source: Ambulatory Visit | Attending: Cardiology | Admitting: Cardiology

## 2017-03-01 DIAGNOSIS — I214 Non-ST elevation (NSTEMI) myocardial infarction: Secondary | ICD-10-CM | POA: Diagnosis not present

## 2017-03-01 DIAGNOSIS — Z951 Presence of aortocoronary bypass graft: Secondary | ICD-10-CM

## 2017-03-01 LAB — GLUCOSE, CAPILLARY: GLUCOSE-CAPILLARY: 172 mg/dL — AB (ref 65–99)

## 2017-03-03 ENCOUNTER — Encounter (HOSPITAL_COMMUNITY)
Admission: RE | Admit: 2017-03-03 | Discharge: 2017-03-03 | Disposition: A | Discharge status: Home / Self Care | Payer: Medicare Other | Source: Ambulatory Visit | Attending: Cardiology | Admitting: Cardiology

## 2017-03-03 DIAGNOSIS — I214 Non-ST elevation (NSTEMI) myocardial infarction: Secondary | ICD-10-CM

## 2017-03-03 DIAGNOSIS — Z951 Presence of aortocoronary bypass graft: Secondary | ICD-10-CM

## 2017-03-03 LAB — GLUCOSE, CAPILLARY: GLUCOSE-CAPILLARY: 180 mg/dL — AB (ref 65–99)

## 2017-03-03 NOTE — Progress Notes (Signed)
Reviewed home exercise with pt today.  Pt plans to walk  for exercise.  Reviewed THR, pulse, RPE, sign and symptoms, and when to call 911 or MD.  Also discussed weather considerations and indoor options.  Pt voiced understanding.    Yurani Fettes [employer]

## 2017-03-06 ENCOUNTER — Encounter (HOSPITAL_COMMUNITY)
Admission: RE | Admit: 2017-03-06 | Discharge: 2017-03-06 | Disposition: A | Discharge status: Home / Self Care | Payer: Medicare Other | Source: Ambulatory Visit | Attending: Cardiology | Admitting: Cardiology

## 2017-03-06 VITALS — Ht 68.5 in | Wt 138.2 lb

## 2017-03-06 DIAGNOSIS — I214 Non-ST elevation (NSTEMI) myocardial infarction: Secondary | ICD-10-CM

## 2017-03-06 DIAGNOSIS — Z951 Presence of aortocoronary bypass graft: Secondary | ICD-10-CM

## 2017-03-06 LAB — GLUCOSE, CAPILLARY: GLUCOSE-CAPILLARY: 131 mg/dL — AB (ref 65–99)

## 2017-03-07 NOTE — Progress Notes (Deleted)
Cardiac Individual Treatment Plan  Patient Details  Name: Shawn Collins MRN: [id number] Date of Birth: 08/30/1950 Referring Provider:     CARDIAC REHAB PHASE II ORIENTATION from 02/14/2017 in [place]  Referring Provider  Fransico Him MD      Initial Encounter Date:    CARDIAC REHAB PHASE II ORIENTATION from 02/14/2017 in [place]  Date  02/14/17  Referring Provider  Fransico Him MD      Visit Diagnosis: NSTEMI (non-ST elevated myocardial infarction) ([place])  S/P CABG x 4  Patient's Home Medications on Admission:  Current Outpatient Prescriptions:  .  amLODipine (NORVASC) 10 MG tablet, Take 1 tablet (10 mg total) by mouth daily., Disp: 30 tablet, Rfl: 0 .  aspirin EC 325 MG EC tablet, Take 1 tablet (325 mg total) by mouth daily., Disp: 30 tablet, Rfl: 0 .  Insulin Glargine (TOUJEO SOLOSTAR) 300 UNIT/ML SOPN, Inject 15 Units into the skin at bedtime. , Disp: , Rfl:  .  metoprolol tartrate (LOPRESSOR) 25 MG tablet, Take 0.5 tablets (12.5 mg total) by mouth 2 (two) times daily., Disp: 30 tablet, Rfl: 1 .  nitroGLYCERIN (NITROSTAT) 0.4 MG SL tablet, Place 1 tablet (0.4 mg total) under the tongue every 5 (five) minutes as needed., Disp: 25 tablet, Rfl: 3 .  rosuvastatin (CRESTOR) 10 MG tablet, Take 1 tablet (10 mg total) by mouth daily at 6 PM., Disp: 90 tablet, Rfl: 3  Past Medical History: Past Medical History:  Diagnosis Date  . CKD (chronic kidney disease) stage 3, GFR 30-59 ml/min 09/2016  . Diabetes mellitus without complication ([place])   . Emphysema lung ([place])   . Hyperlipidemia   . Hypertension     Tobacco Use: History  Smoking Status  . Former Smoker  . Packs/day: 0.25  . Years: 8.00  . Types: Cigarettes  . Quit date: 1972  Smokeless Tobacco  . Never Used    Labs: Recent Review Flowsheet Data    Labs for ITP Cardiac and Pulmonary Rehab Latest Ref Rng & Units 10/26/2016 10/26/2016 10/27/2016  10/27/2016 01/17/2017   Cholestrol 100 - 199 mg/dL - - - - 148   LDLCALC 0 - 99 mg/dL - - - - 82   HDL >39 mg/dL - - - - 42   Trlycerides 0 - 149 mg/dL - - - - 122   Hemoglobin A1c 4.8 - 5.6 % - - - - -   PHART 7.350 - 7.450 7.453(H) - 7.377 - -   PCO2ART 32.0 - 48.0 mmHg 29.0(L) - 36.8 - -   HCO3 20.0 - 28.0 mmol/L 20.6 - 21.8 - -   TCO2 0 - 100 mmol/L 22 20 23 21  -   ACIDBASEDEF 0.0 - 2.0 mmol/L 3.0(H) - 3.0(H) - -   O2SAT % 99.0 - 98.0 - -      Capillary Blood Glucose: Lab Results  Component Value Date   GLUCAP 131 (H) 03/06/2017   GLUCAP 180 (H) 03/03/2017   GLUCAP 172 (H) 03/01/2017   GLUCAP 151 (H) 02/27/2017   GLUCAP 196 (H) 02/27/2017     Exercise Target Goals:    Exercise Program Goal: Individual exercise prescription set with THRR, safety & activity barriers. Participant demonstrates ability to understand and report RPE using BORG scale, to self-measure pulse accurately, and to acknowledge the importance of the exercise prescription.  Exercise Prescription Goal: Starting with aerobic activity 30 plus minutes a day, 3 days per week for initial exercise prescription.  Provide home exercise prescription and guidelines that participant acknowledges understanding prior to discharge.  Activity Barriers & Risk Stratification:     Activity Barriers & Cardiac Risk Stratification - 02/14/17 1346      Activity Barriers & Cardiac Risk Stratification   Activity Barriers Shortness of Breath;Muscular Weakness;Deconditioning   Cardiac Risk Stratification High      6 Minute Walk:     6 Minute Walk    Row Name 02/14/17 1608         6 Minute Walk   Phase Initial     Distance 1600 feet     Walk Time 6 minutes     # of Rest Breaks 0     MPH 3.03     METS 3.7     RPE 13     Symptoms No     Resting HR 65 bpm     Resting BP 100/64     Resting Oxygen Saturation  100 %     Exercise Oxygen Saturation  during 6 min walk 100 %     Max Ex. HR 83 bpm     Max Ex. BP 116/60      2 Minute Post BP 114/70        Oxygen Initial Assessment:   Oxygen Re-Evaluation:   Oxygen Discharge (Final Oxygen Re-Evaluation):   Initial Exercise Prescription:     Initial Exercise Prescription - 02/14/17 1600      Date of Initial Exercise RX and Referring Provider   Date 02/14/17   Referring Provider Fransico Him MD     Treadmill   MPH 2.5   Grade 0   Minutes 15   METs 2.91     Bike   Level 0.5   Minutes 15   METs 2.45     NuStep   Level 3   SPM 80   Minutes 15   METs 2     Prescription Details   Frequency (times per week) 3   Duration Progress to 30 minutes of continuous aerobic without signs/symptoms of physical distress     Intensity   THRR 40-80% of Max Heartrate 61-122   Ratings of Perceived Exertion 11-13   Perceived Dyspnea 0-4     Progression   Progression Continue to progress workloads to maintain intensity without signs/symptoms of physical distress.     Resistance Training   Training Prescription Yes   Weight 2lbs   Reps 10-15      Perform Capillary Blood Glucose checks as needed.  Exercise Prescription Changes:     Exercise Prescription Changes    Row Name 02/22/17 1500             Response to Exercise   Blood Pressure (Admit) 130/72       Blood Pressure (Exercise) 134/60       Blood Pressure (Exit) 130/82       Heart Rate (Admit) 95 bpm       Heart Rate (Exercise) 103 bpm       Heart Rate (Exit) 73 bpm       Rating of Perceived Exertion (Exercise) 13       Symptoms fatigue/deconditioned       Comments encourage pt to take rest breaks       Duration Progress to 30 minutes of  aerobic without signs/symptoms of physical distress       Intensity THRR unchanged         Progression   Progression Continue to progress workloads  to maintain intensity without signs/symptoms of physical distress.       Average METs 2.8         Resistance Training   Training Prescription No         Treadmill   MPH 2.5       Grade 0        Minutes 15       METs 2.91         NuStep   Level 3       SPM 80       Minutes 15       METs 2.8          Exercise Comments:     Exercise Comments    Row Name 02/22/17 1519           Exercise Comments pt was oriented to exercise equipment today. Pt responded to exercise prescription. Encouraged pt to take rest breaks to avoid extreme fatigue/exhaution. Will continue to monitor pt's progress.          Exercise Goals and Review:     Exercise Goals    Row Name 02/14/17 1348             Exercise Goals   Increase Physical Activity Yes       Intervention Provide advice, education, support and counseling about physical activity/exercise needs.;Develop an individualized exercise prescription for aerobic and resistive training based on initial evaluation findings, risk stratification, comorbidities and participant's personal goals.       Expected Outcomes Achievement of increased cardiorespiratory fitness and enhanced flexibility, muscular endurance and strength shown through measurements of functional capacity and personal statement of participant.       Increase Strength and Stamina Yes  improve walking tolerance, be able to climb stairs without SOB and improve muscle mass       Intervention Provide advice, education, support and counseling about physical activity/exercise needs.;Develop an individualized exercise prescription for aerobic and resistive training based on initial evaluation findings, risk stratification, comorbidities and participant's personal goals.       Expected Outcomes Achievement of increased cardiorespiratory fitness and enhanced flexibility, muscular endurance and strength shown through measurements of functional capacity and personal statement of participant.       Able to understand and use rate of perceived exertion (RPE) scale Yes       Intervention Provide education and explanation on how to use RPE scale       Expected Outcomes Short Term:  Able to use RPE daily in rehab to express subjective intensity level;Long Term:  Able to use RPE to guide intensity level when exercising independently       Knowledge and understanding of Target Heart Rate Range (THRR) Yes       Intervention Provide education and explanation of THRR including how the numbers were predicted and where they are located for reference       Expected Outcomes Short Term: Able to state/look up THRR;Long Term: Able to use THRR to govern intensity when exercising independently;Short Term: Able to use daily as guideline for intensity in rehab       Able to check pulse independently Yes       Intervention Provide education and demonstration on how to check pulse in carotid and radial arteries.;Review the importance of being able to check your own pulse for safety during independent exercise       Expected Outcomes Short Term: Able to explain why pulse checking is important during independent exercise;Long  Term: Able to check pulse independently and accurately       Understanding of Exercise Prescription Yes       Intervention Provide education, explanation, and written materials on patient's individual exercise prescription       Expected Outcomes Short Term: Able to explain program exercise prescription;Long Term: Able to explain home exercise prescription to exercise independently          Exercise Goals Re-Evaluation :     Exercise Goals Re-Evaluation    Row Name 03/03/17 1419             Exercise Goal Re-Evaluation   Exercise Goals Review Increase Physical Activity;Able to understand and use rate of perceived exertion (RPE) scale;Knowledge and understanding of Target Heart Rate Range (THRR);Understanding of Exercise Prescription;Able to check pulse independently;Increase Strength and Stamina       Comments Reviewed home exercise with pt today.  Pt plans to walk  for exercise.  Reviewed THR, pulse, RPE, sign and symptoms, and when to call 911 or MD.  Also discussed  weather considerations and indoor options.  Pt voiced understanding.       Expected Outcomes Pt will be compliant with HEP and improve in cardiorespiratory fitness.           Discharge Exercise Prescription (Final Exercise Prescription Changes):     Exercise Prescription Changes - 02/22/17 1500      Response to Exercise   Blood Pressure (Admit) 130/72   Blood Pressure (Exercise) 134/60   Blood Pressure (Exit) 130/82   Heart Rate (Admit) 95 bpm   Heart Rate (Exercise) 103 bpm   Heart Rate (Exit) 73 bpm   Rating of Perceived Exertion (Exercise) 13   Symptoms fatigue/deconditioned   Comments encourage pt to take rest breaks   Duration Progress to 30 minutes of  aerobic without signs/symptoms of physical distress   Intensity THRR unchanged     Progression   Progression Continue to progress workloads to maintain intensity without signs/symptoms of physical distress.   Average METs 2.8     Resistance Training   Training Prescription No     Treadmill   MPH 2.5   Grade 0   Minutes 15   METs 2.91     NuStep   Level 3   SPM 80   Minutes 15   METs 2.8      Nutrition:  Target Goals: Understanding of nutrition guidelines, daily intake of sodium 1500mg , cholesterol 200mg , calories 30% from fat and 7% or less from saturated fats, daily to have 5 or more servings of fruits and vegetables.  Biometrics:     Pre Biometrics - 03/07/17 1410      Pre Biometrics   Height 5' 8.5" (1.74 m)   Weight 138 lb 3.7 oz (62.7 kg)   Waist Circumference 30.5 inches   Hip Circumference 35.5 inches   Waist to Hip Ratio 0.86 %   BMI (Calculated) 20.71   Triceps Skinfold 222 mm   % Body Fat 22.1 %   Grip Strength 37 kg   Flexibility 8.25 in   Single Leg Stand 5.75 seconds       Nutrition Therapy Plan and Nutrition Goals:     Nutrition Therapy & Goals - 02/14/17 1526      Nutrition Therapy   Diet Carb Modified, Therapeutic Lifestyle Changes     Personal Nutrition Goals    Nutrition Goal Improved glycemic control as evidenced by pt's A1c trending toward 7 or less.  Personal Goal #2 Pt to identify food quantities necessary to achieve weight gain of 6-10 lb at graduation from cardiac rehab. Goal wt of 145 lb desired.      Intervention Plan   Intervention Prescribe, educate and counsel regarding individualized specific dietary modifications aiming towards targeted core components such as weight, hypertension, lipid management, diabetes, heart failure and other comorbidities.   Expected Outcomes Short Term Goal: Understand basic principles of dietary content, such as calories, fat, sodium, cholesterol and nutrients.;Long Term Goal: Adherence to prescribed nutrition plan.      Nutrition Discharge: Nutrition Scores:     Nutrition Assessments - 02/14/17 1515      MEDFICTS Scores   Pre Score 40      Nutrition Goals Re-Evaluation:   Nutrition Goals Re-Evaluation:   Nutrition Goals Discharge (Final Nutrition Goals Re-Evaluation):   Psychosocial: Target Goals: Acknowledge presence or absence of significant depression and/or stress, maximize coping skills, provide positive support system. Participant is able to verbalize types and ability to use techniques and skills needed for reducing stress and depression.  Initial Review & Psychosocial Screening:     Initial Psych Review & Screening - 02/14/17 1536      Initial Review   Current issues with None Identified     Family Dynamics   Good Support System? Yes  family friends    Comments upon brief assessment, no psychosocial needs identified, no interventions necessary      Barriers   Psychosocial barriers to participate in program There are no identifiable barriers or psychosocial needs.     Screening Interventions   Interventions Encouraged to exercise      Quality of Life Scores:     Quality of Life - 02/14/17 1535      Quality of Life Scores   Health/Function Pre 20.53 %   Socioeconomic  Pre 23.69 %   Psych/Spiritual Pre 23.43 %   Family Pre 22.3 %   GLOBAL Pre 22.09 %      PHQ-9: Recent Review Flowsheet Data    Depression screen [facility] 2/9 02/22/2017   Decreased Interest 0   Down, Depressed, Hopeless 0   PHQ - 2 Score 0     Interpretation of Total Score  Total Score Depression Severity:  1-4 = Minimal depression, 5-9 = Mild depression, 10-14 = Moderate depression, 15-19 = Moderately severe depression, 20-27 = Severe depression   Psychosocial Evaluation and Intervention:     Psychosocial Evaluation - 02/22/17 1633      Psychosocial Evaluation & Interventions   Interventions Encouraged to exercise with the program and follow exercise prescription   Comments no psychosocial needs identified, no interventions necessary    Expected Outcomes pt will exhibit positive outlook with good coping skills.    Continue Psychosocial Services  No Follow up required      Psychosocial Re-Evaluation:     Psychosocial Re-Evaluation    [place] Name 03/07/17 0715             Psychosocial Re-Evaluation   Current issues with None Identified       Comments no psychosocial needs identified, no interventions necessary        Expected Outcomes pt will exhibit positive outlook with good coping skills.        Interventions Encouraged to attend Cardiac Rehabilitation for the exercise       Continue Psychosocial Services  No Follow up required          Psychosocial Discharge (Final Psychosocial Re-Evaluation):  Psychosocial Re-Evaluation - 03/07/17 0715      Psychosocial Re-Evaluation   Current issues with None Identified   Comments no psychosocial needs identified, no interventions necessary    Expected Outcomes pt will exhibit positive outlook with good coping skills.    Interventions Encouraged to attend Cardiac Rehabilitation for the exercise   Continue Psychosocial Services  No Follow up required      Vocational Rehabilitation: Provide vocational rehab assistance to  qualifying candidates.   Vocational Rehab Evaluation & Intervention:     Vocational Rehab - 02/14/17 1536      Initial Vocational Rehab Evaluation & Intervention   Assessment shows need for Vocational Rehabilitation Yes      Education: Education Goals: Education classes will be provided on a weekly basis, covering required topics. Participant will state understanding/return demonstration of topics presented.  Learning Barriers/Preferences:     Learning Barriers/Preferences - 02/14/17 1346      Learning Barriers/Preferences   Learning Barriers Sight   Learning Preferences Written Material      Education Topics: Count Your Pulse:  -Group instruction provided by verbal instruction, demonstration, patient participation and written materials to support subject.  Instructors address importance of being able to find your pulse and how to count your pulse when at home without a heart monitor.  Patients get hands on experience counting their pulse with staff help and individually.   Heart Attack, Angina, and Risk Factor Modification:  -Group instruction provided by verbal instruction, video, and written materials to support subject.  Instructors address signs and symptoms of angina and heart attacks.    Also discuss risk factors for heart disease and how to make changes to improve heart health risk factors.   Functional Fitness:  -Group instruction provided by verbal instruction, demonstration, patient participation, and written materials to support subject.  Instructors address safety measures for doing things around the house.  Discuss how to get up and down off the floor, how to pick things up properly, how to safely get out of a chair without assistance, and balance training.   Meditation and Mindfulness:  -Group instruction provided by verbal instruction, patient participation, and written materials to support subject.  Instructor addresses importance of mindfulness and meditation  practice to help reduce stress and improve awareness.  Instructor also leads participants through a meditation exercise.    Stretching for Flexibility and Mobility:  -Group instruction provided by verbal instruction, patient participation, and written materials to support subject.  Instructors lead participants through series of stretches that are designed to increase flexibility thus improving mobility.  These stretches are additional exercise for major muscle groups that are typically performed during regular warm up and cool down.   Hands Only CPR:  -Group verbal, video, and participation provides a basic overview of AHA guidelines for community CPR. Role-play of emergencies allow participants the opportunity to practice calling for help and chest compression technique with discussion of AED use.   Hypertension: -Group verbal and written instruction that provides a basic overview of hypertension including the most recent diagnostic guidelines, risk factor reduction with self-care instructions and medication management.    Nutrition I class: Heart Healthy Eating:  -Group instruction provided by PowerPoint slides, verbal discussion, and written materials to support subject matter. The instructor gives an explanation and review of the Therapeutic Lifestyle Changes diet recommendations, which includes a discussion on lipid goals, dietary fat, sodium, fiber, plant stanol/sterol esters, sugar, and the components of a well-balanced, healthy diet.   Nutrition II class:  Lifestyle Skills:  -Group instruction provided by PowerPoint slides, verbal discussion, and written materials to support subject matter. The instructor gives an explanation and review of label reading, grocery shopping for heart health, heart healthy recipe modifications, and ways to make healthier choices when eating out.   Diabetes Question & Answer:  -Group instruction provided by PowerPoint slides, verbal discussion, and  written materials to support subject matter. The instructor gives an explanation and review of diabetes co-morbidities, pre- and post-prandial blood glucose goals, pre-exercise blood glucose goals, signs, symptoms, and treatment of hypoglycemia and hyperglycemia, and foot care basics.   Diabetes Blitz:  -Group instruction provided by PowerPoint slides, verbal discussion, and written materials to support subject matter. The instructor gives an explanation and review of the physiology behind type 1 and type 2 diabetes, diabetes medications and rational behind using different medications, pre- and post-prandial blood glucose recommendations and Hemoglobin A1c goals, diabetes diet, and exercise including blood glucose guidelines for exercising safely.    Portion Distortion:  -Group instruction provided by PowerPoint slides, verbal discussion, written materials, and food models to support subject matter. The instructor gives an explanation of serving size versus portion size, changes in portions sizes over the last 20 years, and what consists of a serving from each food group.   Stress Management:  -Group instruction provided by verbal instruction, video, and written materials to support subject matter.  Instructors review role of stress in heart disease and how to cope with stress positively.     Exercising on Your Own:  -Group instruction provided by verbal instruction, power point, and written materials to support subject.  Instructors discuss benefits of exercise, components of exercise, frequency and intensity of exercise, and end points for exercise.  Also discuss use of nitroglycerin and activating EMS.  Review options of places to exercise outside of rehab.  Review guidelines for sex with heart disease.   Cardiac Drugs I:  -Group instruction provided by verbal instruction and written materials to support subject.  Instructor reviews cardiac drug classes: antiplatelets, anticoagulants, beta  blockers, and statins.  Instructor discusses reasons, side effects, and lifestyle considerations for each drug class.   Cardiac Drugs II:  -Group instruction provided by verbal instruction and written materials to support subject.  Instructor reviews cardiac drug classes: angiotensin converting enzyme inhibitors (ACE-I), angiotensin II receptor blockers (ARBs), nitrates, and calcium channel blockers.  Instructor discusses reasons, side effects, and lifestyle considerations for each drug class.   Anatomy and Physiology of the Circulatory System:  Group verbal and written instruction and models provide basic cardiac anatomy and physiology, with the coronary electrical and arterial systems. Review of: AMI, Angina, Valve disease, Heart Failure, Peripheral Artery Disease, Cardiac Arrhythmia, Pacemakers, and the ICD.   CARDIAC REHAB PHASE II EXERCISE from 02/22/2017 in [facility]  Date  02/22/17  Instruction Review Code  2- meets goals/outcomes      Other Education:  -Group or individual verbal, written, or video instructions that support the educational goals of the cardiac rehab program.   Knowledge Questionnaire Score:     Knowledge Questionnaire Score - 02/14/17 1535      Knowledge Questionnaire Score   Pre Score 18/24      Core Components/Risk Factors/Patient Goals at Admission:     Personal Goals and Risk Factors at Admission - 02/14/17 1615      Core Components/Risk Factors/Patient Goals on Admission   Diabetes Yes   Intervention Provide education about signs/symptoms and action to take  for hypo/hyperglycemia.;Provide education about proper nutrition, including hydration, and aerobic/resistive exercise prescription along with prescribed medications to achieve blood glucose in normal ranges: Fasting glucose 65-99 mg/dL   Expected Outcomes Short Term: Participant verbalizes understanding of the signs/symptoms and immediate care of hyper/hypoglycemia,  proper foot care and importance of medication, aerobic/resistive exercise and nutrition plan for blood glucose control.;Long Term: Attainment of HbA1C < 7%.   Hypertension Yes   Intervention Provide education on lifestyle modifcations including regular physical activity/exercise, weight management, moderate sodium restriction and increased consumption of fresh fruit, vegetables, and low fat dairy, alcohol moderation, and smoking cessation.;Monitor prescription use compliance.   Expected Outcomes Short Term: Continued assessment and intervention until BP is < 140/34mm HG in hypertensive participants. < 130/60mm HG in hypertensive participants with diabetes, heart failure or chronic kidney disease.;Long Term: Maintenance of blood pressure at goal levels.   Lipids Yes   Intervention Provide education and support for participant on nutrition & aerobic/resistive exercise along with prescribed medications to achieve LDL 70mg , HDL >40mg .   Expected Outcomes Short Term: Participant states understanding of desired cholesterol values and is compliant with medications prescribed. Participant is following exercise prescription and nutrition guidelines.;Long Term: Cholesterol controlled with medications as prescribed, with individualized exercise RX and with personalized nutrition plan. Value goals: LDL < 70mg , HDL > 40 mg.      Core Components/Risk Factors/Patient Goals Review:      Goals and Risk Factor Review    Row Name 03/07/17 0713 03/07/17 1410           Core Components/Risk Factors/Patient Goals Review   Personal Goals Review Diabetes;Hypertension;Lipids -      Review pt with multiple CAD RF demonstrates willingness to participate in CR activities.  Blood sugar and blood pressure WNL. -      Expected Outcomes pt will participate in CR exercise, nutrition and lifestyle modification to reduce overall RF.  -         Core Components/Risk Factors/Patient Goals at Discharge (Final Review):       Goals and Risk Factor Review - 03/07/17 1410      Core Components/Risk Factors/Patient Goals Review   Personal Goals Review --   Review --   Expected Outcomes --      ITP Comments:     ITP Comments    Row Name 02/14/17 1345 03/07/17 0713         ITP Comments Medical Director, Dr. Fransico Him 30 day ITP review.  pt with good participation and attendance.          Comments:

## 2017-03-08 ENCOUNTER — Encounter (HOSPITAL_COMMUNITY): Payer: Self-pay

## 2017-03-08 ENCOUNTER — Encounter (HOSPITAL_COMMUNITY)
Admission: RE | Admit: 2017-03-08 | Discharge: 2017-03-08 | Disposition: A | Discharge status: Home / Self Care | Payer: Medicare Other | Source: Ambulatory Visit | Attending: Cardiology | Admitting: Cardiology

## 2017-03-08 DIAGNOSIS — I214 Non-ST elevation (NSTEMI) myocardial infarction: Secondary | ICD-10-CM | POA: Diagnosis not present

## 2017-03-08 DIAGNOSIS — Z951 Presence of aortocoronary bypass graft: Secondary | ICD-10-CM

## 2017-03-08 LAB — GLUCOSE, CAPILLARY: GLUCOSE-CAPILLARY: 132 mg/dL — AB (ref 65–99)

## 2017-03-08 NOTE — Progress Notes (Signed)
Cardiac Individual Treatment Plan  Patient Details  Name: Fabyan Loughmiller MRN: [id number] Date of Birth: 01/16/1951 Referring Provider:     CARDIAC REHAB PHASE II ORIENTATION from 02/14/2017 in [place]  Referring Provider  Fransico Him MD      Initial Encounter Date:    CARDIAC REHAB PHASE II ORIENTATION from 02/14/2017 in [place]  Date  02/14/17  Referring Provider  Fransico Him MD      Visit Diagnosis: NSTEMI (non-ST elevated myocardial infarction) ([place])  S/P CABG x 4  Patient's Home Medications on Admission:  Current Outpatient Prescriptions:  .  amLODipine (NORVASC) 10 MG tablet, Take 1 tablet (10 mg total) by mouth daily., Disp: 30 tablet, Rfl: 0 .  aspirin EC 325 MG EC tablet, Take 1 tablet (325 mg total) by mouth daily., Disp: 30 tablet, Rfl: 0 .  Insulin Glargine (TOUJEO SOLOSTAR) 300 UNIT/ML SOPN, Inject 15 Units into the skin at bedtime. , Disp: , Rfl:  .  metoprolol tartrate (LOPRESSOR) 25 MG tablet, Take 0.5 tablets (12.5 mg total) by mouth 2 (two) times daily., Disp: 30 tablet, Rfl: 1 .  nitroGLYCERIN (NITROSTAT) 0.4 MG SL tablet, Place 1 tablet (0.4 mg total) under the tongue every 5 (five) minutes as needed., Disp: 25 tablet, Rfl: 3 .  rosuvastatin (CRESTOR) 10 MG tablet, Take 1 tablet (10 mg total) by mouth daily at 6 PM., Disp: 90 tablet, Rfl: 3  Past Medical History: Past Medical History:  Diagnosis Date  . CKD (chronic kidney disease) stage 3, GFR 30-59 ml/min (HCC) 09/2016  . Diabetes mellitus without complication ([place])   . Emphysema lung ([place])   . Hyperlipidemia   . Hypertension     Tobacco Use: History  Smoking Status  . Former Smoker  . Packs/day: 0.25  . Years: 8.00  . Types: Cigarettes  . Quit date: 1972  Smokeless Tobacco  . Never Used    Labs: Recent Review Flowsheet Data    Labs for ITP Cardiac and Pulmonary Rehab Latest Ref Rng & Units 10/26/2016 10/26/2016 10/27/2016  10/27/2016 01/17/2017   Cholestrol 100 - 199 mg/dL - - - - 148   LDLCALC 0 - 99 mg/dL - - - - 82   HDL >39 mg/dL - - - - 42   Trlycerides 0 - 149 mg/dL - - - - 122   Hemoglobin A1c 4.8 - 5.6 % - - - - -   PHART 7.350 - 7.450 7.453(H) - 7.377 - -   PCO2ART 32.0 - 48.0 mmHg 29.0(L) - 36.8 - -   HCO3 20.0 - 28.0 mmol/L 20.6 - 21.8 - -   TCO2 0 - 100 mmol/L 22 20 23 21  -   ACIDBASEDEF 0.0 - 2.0 mmol/L 3.0(H) - 3.0(H) - -   O2SAT % 99.0 - 98.0 - -      Capillary Blood Glucose: Lab Results  Component Value Date   GLUCAP 132 (H) 03/08/2017   GLUCAP 131 (H) 03/06/2017   GLUCAP 180 (H) 03/03/2017   GLUCAP 172 (H) 03/01/2017   GLUCAP 151 (H) 02/27/2017     Exercise Target Goals:    Exercise Program Goal: Individual exercise prescription set with THRR, safety & activity barriers. Participant demonstrates ability to understand and report RPE using BORG scale, to self-measure pulse accurately, and to acknowledge the importance of the exercise prescription.  Exercise Prescription Goal: Starting with aerobic activity 30 plus minutes a day, 3 days per week for initial exercise  prescription. Provide home exercise prescription and guidelines that participant acknowledges understanding prior to discharge.  Activity Barriers & Risk Stratification:     Activity Barriers & Cardiac Risk Stratification - 02/14/17 1346      Activity Barriers & Cardiac Risk Stratification   Activity Barriers Shortness of Breath;Muscular Weakness;Deconditioning   Cardiac Risk Stratification High      6 Minute Walk:     6 Minute Walk    Row Name 02/14/17 1608         6 Minute Walk   Phase Initial     Distance 1600 feet     Walk Time 6 minutes     # of Rest Breaks 0     MPH 3.03     METS 3.7     RPE 13     Symptoms No     Resting HR 65 bpm     Resting BP 100/64     Resting Oxygen Saturation  100 %     Exercise Oxygen Saturation  during 6 min walk 100 %     Max Ex. HR 83 bpm     Max Ex. BP 116/60      2 Minute Post BP 114/70        Oxygen Initial Assessment:   Oxygen Re-Evaluation:   Oxygen Discharge (Final Oxygen Re-Evaluation):   Initial Exercise Prescription:     Initial Exercise Prescription - 02/14/17 1600      Date of Initial Exercise RX and Referring Provider   Date 02/14/17   Referring Provider Fransico Him MD     Treadmill   MPH 2.5   Grade 0   Minutes 15   METs 2.91     Bike   Level 0.5   Minutes 15   METs 2.45     NuStep   Level 3   SPM 80   Minutes 15   METs 2     Prescription Details   Frequency (times per week) 3   Duration Progress to 30 minutes of continuous aerobic without signs/symptoms of physical distress     Intensity   THRR 40-80% of Max Heartrate 61-122   Ratings of Perceived Exertion 11-13   Perceived Dyspnea 0-4     Progression   Progression Continue to progress workloads to maintain intensity without signs/symptoms of physical distress.     Resistance Training   Training Prescription Yes   Weight 2lbs   Reps 10-15      Perform Capillary Blood Glucose checks as needed.  Exercise Prescription Changes:     Exercise Prescription Changes    Row Name 02/22/17 1500 03/07/17 1600           Response to Exercise   Blood Pressure (Admit) 130/72 116/70      Blood Pressure (Exercise) 134/60 158/80      Blood Pressure (Exit) 130/82 114/70      Heart Rate (Admit) 95 bpm 74 bpm      Heart Rate (Exercise) 103 bpm 102 bpm      Heart Rate (Exit) 73 bpm 74 bpm      Rating of Perceived Exertion (Exercise) 13 13      Symptoms fatigue/deconditioned  -      Comments encourage pt to take rest breaks encourage pt to take rest breaks      Duration Progress to 30 minutes of  aerobic without signs/symptoms of physical distress Progress to 30 minutes of  aerobic without signs/symptoms of physical distress  Intensity THRR unchanged THRR unchanged        Progression   Progression Continue to progress workloads to maintain  intensity without signs/symptoms of physical distress. Continue to progress workloads to maintain intensity without signs/symptoms of physical distress.      Average METs 2.8 2.7        Resistance Training   Training Prescription No Yes      Weight  - 3lbs      Reps  - 10-15      Time  - 10 Minutes        Treadmill   MPH 2.5 2.5      Grade 0 0      Minutes 15 15      METs 2.91 2.91        Bike   Level  - 0.5      Minutes  - 15      METs  - 2.5        NuStep   Level 3  -      SPM 80  -      Minutes 15  -      METs 2.8  -        Home Exercise Plan   Plans to continue exercise at  - Home (comment)      Frequency  - Add 2 additional days to program exercise sessions.      Initial Home Exercises Provided  - 03/03/17         Exercise Comments:     Exercise Comments    Row Name 02/22/17 1519 03/07/17 1636         Exercise Comments pt was oriented to exercise equipment today. Pt responded to exercise prescription. Encouraged pt to take rest breaks to avoid extreme fatigue/exhaution. Will continue to monitor pt's progress. Reviewed METs and goals. Pt is tolerating exercise fairly well; will continue to monitor pt's activity levels and exercise progression.          Exercise Goals and Review:     Exercise Goals    Row Name 02/14/17 1348             Exercise Goals   Increase Physical Activity Yes       Intervention Provide advice, education, support and counseling about physical activity/exercise needs.;Develop an individualized exercise prescription for aerobic and resistive training based on initial evaluation findings, risk stratification, comorbidities and participant's personal goals.       Expected Outcomes Achievement of increased cardiorespiratory fitness and enhanced flexibility, muscular endurance and strength shown through measurements of functional capacity and personal statement of participant.       Increase Strength and Stamina Yes  improve walking  tolerance, be able to climb stairs without SOB and improve muscle mass       Intervention Provide advice, education, support and counseling about physical activity/exercise needs.;Develop an individualized exercise prescription for aerobic and resistive training based on initial evaluation findings, risk stratification, comorbidities and participant's personal goals.       Expected Outcomes Achievement of increased cardiorespiratory fitness and enhanced flexibility, muscular endurance and strength shown through measurements of functional capacity and personal statement of participant.       Able to understand and use rate of perceived exertion (RPE) scale Yes       Intervention Provide education and explanation on how to use RPE scale       Expected Outcomes Short Term: Able to use RPE daily in rehab to express  subjective intensity level;Long Term:  Able to use RPE to guide intensity level when exercising independently       Knowledge and understanding of Target Heart Rate Range (THRR) Yes       Intervention Provide education and explanation of THRR including how the numbers were predicted and where they are located for reference       Expected Outcomes Short Term: Able to state/look up THRR;Long Term: Able to use THRR to govern intensity when exercising independently;Short Term: Able to use daily as guideline for intensity in rehab       Able to check pulse independently Yes       Intervention Provide education and demonstration on how to check pulse in carotid and radial arteries.;Review the importance of being able to check your own pulse for safety during independent exercise       Expected Outcomes Short Term: Able to explain why pulse checking is important during independent exercise;Long Term: Able to check pulse independently and accurately       Understanding of Exercise Prescription Yes       Intervention Provide education, explanation, and written materials on patient's individual exercise  prescription       Expected Outcomes Short Term: Able to explain program exercise prescription;Long Term: Able to explain home exercise prescription to exercise independently          Exercise Goals Re-Evaluation :     Exercise Goals Re-Evaluation    Row Name 03/03/17 1419             Exercise Goal Re-Evaluation   Exercise Goals Review Increase Physical Activity;Able to understand and use rate of perceived exertion (RPE) scale;Knowledge and understanding of Target Heart Rate Range (THRR);Understanding of Exercise Prescription;Able to check pulse independently;Increase Strength and Stamina       Comments Reviewed home exercise with pt today.  Pt plans to walk  for exercise.  Reviewed THR, pulse, RPE, sign and symptoms, and when to call 911 or MD.  Also discussed weather considerations and indoor options.  Pt voiced understanding.       Expected Outcomes Pt will be compliant with HEP and improve in cardiorespiratory fitness.           Discharge Exercise Prescription (Final Exercise Prescription Changes):     Exercise Prescription Changes - 03/07/17 1600      Response to Exercise   Blood Pressure (Admit) 116/70   Blood Pressure (Exercise) 158/80   Blood Pressure (Exit) 114/70   Heart Rate (Admit) 74 bpm   Heart Rate (Exercise) 102 bpm   Heart Rate (Exit) 74 bpm   Rating of Perceived Exertion (Exercise) 13   Comments encourage pt to take rest breaks   Duration Progress to 30 minutes of  aerobic without signs/symptoms of physical distress   Intensity THRR unchanged     Progression   Progression Continue to progress workloads to maintain intensity without signs/symptoms of physical distress.   Average METs 2.7     Resistance Training   Training Prescription Yes   Weight 3lbs   Reps 10-15   Time 10 Minutes     Treadmill   MPH 2.5   Grade 0   Minutes 15   METs 2.91     Bike   Level 0.5   Minutes 15   METs 2.5     Home Exercise Plan   Plans to continue exercise at  Home (comment)   Frequency Add 2 additional days to program exercise sessions.  Initial Home Exercises Provided 03/03/17      Nutrition:  Target Goals: Understanding of nutrition guidelines, daily intake of sodium 1500mg , cholesterol 200mg , calories 30% from fat and 7% or less from saturated fats, daily to have 5 or more servings of fruits and vegetables.  Biometrics:     Pre Biometrics - 03/07/17 1410      Pre Biometrics   Height 5' 8.5" (1.74 m)   Weight 138 lb 3.7 oz (62.7 kg)   Waist Circumference 30.5 inches   Hip Circumference 35.5 inches   Waist to Hip Ratio 0.86 %   BMI (Calculated) 20.71   Triceps Skinfold 222 mm   % Body Fat 22.1 %   Grip Strength 37 kg   Flexibility 8.25 in   Single Leg Stand 5.75 seconds       Nutrition Therapy Plan and Nutrition Goals:     Nutrition Therapy & Goals - 02/14/17 1526      Nutrition Therapy   Diet Carb Modified, Therapeutic Lifestyle Changes     Personal Nutrition Goals   Nutrition Goal Improved glycemic control as evidenced by pt's A1c trending toward 7 or less.    Personal Goal #2 Pt to identify food quantities necessary to achieve weight gain of 6-10 lb at graduation from cardiac rehab. Goal wt of 145 lb desired.      Intervention Plan   Intervention Prescribe, educate and counsel regarding individualized specific dietary modifications aiming towards targeted core components such as weight, hypertension, lipid management, diabetes, heart failure and other comorbidities.   Expected Outcomes Short Term Goal: Understand basic principles of dietary content, such as calories, fat, sodium, cholesterol and nutrients.;Long Term Goal: Adherence to prescribed nutrition plan.      Nutrition Discharge: Nutrition Scores:     Nutrition Assessments - 02/14/17 1515      MEDFICTS Scores   Pre Score 40      Nutrition Goals Re-Evaluation:   Nutrition Goals Re-Evaluation:   Nutrition Goals Discharge (Final Nutrition Goals  Re-Evaluation):   Psychosocial: Target Goals: Acknowledge presence or absence of significant depression and/or stress, maximize coping skills, provide positive support system. Participant is able to verbalize types and ability to use techniques and skills needed for reducing stress and depression.  Initial Review & Psychosocial Screening:     Initial Psych Review & Screening - 02/14/17 1536      Initial Review   Current issues with None Identified     Family Dynamics   Good Support System? Yes  family friends    Comments upon brief assessment, no psychosocial needs identified, no interventions necessary      Barriers   Psychosocial barriers to participate in program There are no identifiable barriers or psychosocial needs.     Screening Interventions   Interventions Encouraged to exercise      Quality of Life Scores:     Quality of Life - 02/14/17 1535      Quality of Life Scores   Health/Function Pre 20.53 %   Socioeconomic Pre 23.69 %   Psych/Spiritual Pre 23.43 %   Family Pre 22.3 %   GLOBAL Pre 22.09 %      PHQ-9: Recent Review Flowsheet Data    Depression screen [facility] 2/9 02/22/2017   Decreased Interest 0   Down, Depressed, Hopeless 0   PHQ - 2 Score 0     Interpretation of Total Score  Total Score Depression Severity:  1-4 = Minimal depression, 5-9 = Mild depression, 10-14 = Moderate  depression, 15-19 = Moderately severe depression, 20-27 = Severe depression   Psychosocial Evaluation and Intervention:     Psychosocial Evaluation - 02/22/17 1633      Psychosocial Evaluation & Interventions   Interventions Encouraged to exercise with the program and follow exercise prescription   Comments no psychosocial needs identified, no interventions necessary    Expected Outcomes pt will exhibit positive outlook with good coping skills.    Continue Psychosocial Services  No Follow up required      Psychosocial Re-Evaluation:     Psychosocial Re-Evaluation     [place] Name 03/07/17 0715             Psychosocial Re-Evaluation   Current issues with None Identified       Comments no psychosocial needs identified, no interventions necessary        Expected Outcomes pt will exhibit positive outlook with good coping skills.        Interventions Encouraged to attend Cardiac Rehabilitation for the exercise       Continue Psychosocial Services  No Follow up required          Psychosocial Discharge (Final Psychosocial Re-Evaluation):     Psychosocial Re-Evaluation - 03/07/17 0715      Psychosocial Re-Evaluation   Current issues with None Identified   Comments no psychosocial needs identified, no interventions necessary    Expected Outcomes pt will exhibit positive outlook with good coping skills.    Interventions Encouraged to attend Cardiac Rehabilitation for the exercise   Continue Psychosocial Services  No Follow up required      Vocational Rehabilitation: Provide vocational rehab assistance to qualifying candidates.   Vocational Rehab Evaluation & Intervention:     Vocational Rehab - 02/14/17 1536      Initial Vocational Rehab Evaluation & Intervention   Assessment shows need for Vocational Rehabilitation Yes      Education: Education Goals: Education classes will be provided on a weekly basis, covering required topics. Participant will state understanding/return demonstration of topics presented.  Learning Barriers/Preferences:     Learning Barriers/Preferences - 02/14/17 1346      Learning Barriers/Preferences   Learning Barriers Sight   Learning Preferences Written Material      Education Topics: Count Your Pulse:  -Group instruction provided by verbal instruction, demonstration, patient participation and written materials to support subject.  Instructors address importance of being able to find your pulse and how to count your pulse when at home without a heart monitor.  Patients get hands on experience counting their  pulse with staff help and individually.   Heart Attack, Angina, and Risk Factor Modification:  -Group instruction provided by verbal instruction, video, and written materials to support subject.  Instructors address signs and symptoms of angina and heart attacks.    Also discuss risk factors for heart disease and how to make changes to improve heart health risk factors.   Functional Fitness:  -Group instruction provided by verbal instruction, demonstration, patient participation, and written materials to support subject.  Instructors address safety measures for doing things around the house.  Discuss how to get up and down off the floor, how to pick things up properly, how to safely get out of a chair without assistance, and balance training.   Meditation and Mindfulness:  -Group instruction provided by verbal instruction, patient participation, and written materials to support subject.  Instructor addresses importance of mindfulness and meditation practice to help reduce stress and improve awareness.  Instructor also leads participants through  a meditation exercise.    Stretching for Flexibility and Mobility:  -Group instruction provided by verbal instruction, patient participation, and written materials to support subject.  Instructors lead participants through series of stretches that are designed to increase flexibility thus improving mobility.  These stretches are additional exercise for major muscle groups that are typically performed during regular warm up and cool down.   Hands Only CPR:  -Group verbal, video, and participation provides a basic overview of AHA guidelines for community CPR. Role-play of emergencies allow participants the opportunity to practice calling for help and chest compression technique with discussion of AED use.   Hypertension: -Group verbal and written instruction that provides a basic overview of hypertension including the most recent diagnostic guidelines,  risk factor reduction with self-care instructions and medication management.    Nutrition I class: Heart Healthy Eating:  -Group instruction provided by PowerPoint slides, verbal discussion, and written materials to support subject matter. The instructor gives an explanation and review of the Therapeutic Lifestyle Changes diet recommendations, which includes a discussion on lipid goals, dietary fat, sodium, fiber, plant stanol/sterol esters, sugar, and the components of a well-balanced, healthy diet.   Nutrition II class: Lifestyle Skills:  -Group instruction provided by PowerPoint slides, verbal discussion, and written materials to support subject matter. The instructor gives an explanation and review of label reading, grocery shopping for heart health, heart healthy recipe modifications, and ways to make healthier choices when eating out.   Diabetes Question & Answer:  -Group instruction provided by PowerPoint slides, verbal discussion, and written materials to support subject matter. The instructor gives an explanation and review of diabetes co-morbidities, pre- and post-prandial blood glucose goals, pre-exercise blood glucose goals, signs, symptoms, and treatment of hypoglycemia and hyperglycemia, and foot care basics.   Diabetes Blitz:  -Group instruction provided by PowerPoint slides, verbal discussion, and written materials to support subject matter. The instructor gives an explanation and review of the physiology behind type 1 and type 2 diabetes, diabetes medications and rational behind using different medications, pre- and post-prandial blood glucose recommendations and Hemoglobin A1c goals, diabetes diet, and exercise including blood glucose guidelines for exercising safely.    Portion Distortion:  -Group instruction provided by PowerPoint slides, verbal discussion, written materials, and food models to support subject matter. The instructor gives an explanation of serving size versus  portion size, changes in portions sizes over the last 20 years, and what consists of a serving from each food group.   Stress Management:  -Group instruction provided by verbal instruction, video, and written materials to support subject matter.  Instructors review role of stress in heart disease and how to cope with stress positively.     Exercising on Your Own:  -Group instruction provided by verbal instruction, power point, and written materials to support subject.  Instructors discuss benefits of exercise, components of exercise, frequency and intensity of exercise, and end points for exercise.  Also discuss use of nitroglycerin and activating EMS.  Review options of places to exercise outside of rehab.  Review guidelines for sex with heart disease.   Cardiac Drugs I:  -Group instruction provided by verbal instruction and written materials to support subject.  Instructor reviews cardiac drug classes: antiplatelets, anticoagulants, beta blockers, and statins.  Instructor discusses reasons, side effects, and lifestyle considerations for each drug class.   Cardiac Drugs II:  -Group instruction provided by verbal instruction and written materials to support subject.  Instructor reviews cardiac drug classes: angiotensin converting enzyme inhibitors (ACE-I),  angiotensin II receptor blockers (ARBs), nitrates, and calcium channel blockers.  Instructor discusses reasons, side effects, and lifestyle considerations for each drug class.   Anatomy and Physiology of the Circulatory System:  Group verbal and written instruction and models provide basic cardiac anatomy and physiology, with the coronary electrical and arterial systems. Review of: AMI, Angina, Valve disease, Heart Failure, Peripheral Artery Disease, Cardiac Arrhythmia, Pacemakers, and the ICD.   CARDIAC REHAB PHASE II EXERCISE from 02/22/2017 in [place]  Date  02/22/17  Instruction Review Code  2- meets  goals/outcomes      Other Education:  -Group or individual verbal, written, or video instructions that support the educational goals of the cardiac rehab program.   Knowledge Questionnaire Score:     Knowledge Questionnaire Score - 02/14/17 1535      Knowledge Questionnaire Score   Pre Score 18/24      Core Components/Risk Factors/Patient Goals at Admission:     Personal Goals and Risk Factors at Admission - 02/14/17 1615      Core Components/Risk Factors/Patient Goals on Admission   Diabetes Yes   Intervention Provide education about signs/symptoms and action to take for hypo/hyperglycemia.;Provide education about proper nutrition, including hydration, and aerobic/resistive exercise prescription along with prescribed medications to achieve blood glucose in normal ranges: Fasting glucose 65-99 mg/dL   Expected Outcomes Short Term: Participant verbalizes understanding of the signs/symptoms and immediate care of hyper/hypoglycemia, proper foot care and importance of medication, aerobic/resistive exercise and nutrition plan for blood glucose control.;Long Term: Attainment of HbA1C < 7%.   Hypertension Yes   Intervention Provide education on lifestyle modifcations including regular physical activity/exercise, weight management, moderate sodium restriction and increased consumption of fresh fruit, vegetables, and low fat dairy, alcohol moderation, and smoking cessation.;Monitor prescription use compliance.   Expected Outcomes Short Term: Continued assessment and intervention until BP is < 140/42mm HG in hypertensive participants. < 130/58mm HG in hypertensive participants with diabetes, heart failure or chronic kidney disease.;Long Term: Maintenance of blood pressure at goal levels.   Lipids Yes   Intervention Provide education and support for participant on nutrition & aerobic/resistive exercise along with prescribed medications to achieve LDL 70mg , HDL >40mg .   Expected Outcomes Short  Term: Participant states understanding of desired cholesterol values and is compliant with medications prescribed. Participant is following exercise prescription and nutrition guidelines.;Long Term: Cholesterol controlled with medications as prescribed, with individualized exercise RX and with personalized nutrition plan. Value goals: LDL < 70mg , HDL > 40 mg.      Core Components/Risk Factors/Patient Goals Review:      Goals and Risk Factor Review    Row Name 03/07/17 0713 03/07/17 1410           Core Components/Risk Factors/Patient Goals Review   Personal Goals Review Diabetes;Hypertension;Lipids -      Review pt with multiple CAD RF demonstrates willingness to participate in CR activities.  Blood sugar and blood pressure WNL. -      Expected Outcomes pt will participate in CR exercise, nutrition and lifestyle modification to reduce overall RF.  -         Core Components/Risk Factors/Patient Goals at Discharge (Final Review):      Goals and Risk Factor Review - 03/07/17 1410      Core Components/Risk Factors/Patient Goals Review   Personal Goals Review --   Review --   Expected Outcomes --      ITP Comments:     ITP Comments  [place] Name 02/14/17 1345 03/07/17 0713         ITP Comments Medical Director, Dr. Fransico Him 30 day ITP review.  pt with good participation and attendance.          Comments: Pt is making expected progress toward personal goals after completing 7 sessions. Recommend continued exercise and life style modification education including  stress management and relaxation techniques to decrease cardiac risk profile.

## 2017-03-10 ENCOUNTER — Encounter (HOSPITAL_COMMUNITY)
Admission: RE | Admit: 2017-03-10 | Discharge: 2017-03-10 | Disposition: A | Discharge status: Home / Self Care | Payer: Medicare Other | Source: Ambulatory Visit | Attending: Cardiology | Admitting: Cardiology

## 2017-03-10 DIAGNOSIS — I214 Non-ST elevation (NSTEMI) myocardial infarction: Secondary | ICD-10-CM | POA: Diagnosis not present

## 2017-03-10 DIAGNOSIS — Z951 Presence of aortocoronary bypass graft: Secondary | ICD-10-CM

## 2017-03-13 ENCOUNTER — Encounter (HOSPITAL_COMMUNITY)
Admission: RE | Admit: 2017-03-13 | Discharge: 2017-03-13 | Disposition: A | Discharge status: Home / Self Care | Payer: Medicare Other | Source: Ambulatory Visit | Attending: Cardiology | Admitting: Cardiology

## 2017-03-13 DIAGNOSIS — I214 Non-ST elevation (NSTEMI) myocardial infarction: Secondary | ICD-10-CM

## 2017-03-13 DIAGNOSIS — Z951 Presence of aortocoronary bypass graft: Secondary | ICD-10-CM

## 2017-03-13 LAB — GLUCOSE, CAPILLARY: GLUCOSE-CAPILLARY: 209 mg/dL — AB (ref 65–99)

## 2017-03-15 ENCOUNTER — Encounter (HOSPITAL_COMMUNITY)
Admission: RE | Admit: 2017-03-15 | Discharge: 2017-03-15 | Disposition: A | Discharge status: Home / Self Care | Payer: Medicare Other | Source: Ambulatory Visit | Attending: Cardiology | Admitting: Cardiology

## 2017-03-15 DIAGNOSIS — Z951 Presence of aortocoronary bypass graft: Secondary | ICD-10-CM

## 2017-03-15 DIAGNOSIS — I214 Non-ST elevation (NSTEMI) myocardial infarction: Secondary | ICD-10-CM

## 2017-03-17 ENCOUNTER — Encounter (HOSPITAL_COMMUNITY)
Admission: RE | Admit: 2017-03-17 | Discharge: 2017-03-17 | Disposition: A | Discharge status: Home / Self Care | Payer: Medicare Other | Source: Ambulatory Visit | Attending: Cardiology | Admitting: Cardiology

## 2017-03-17 DIAGNOSIS — I214 Non-ST elevation (NSTEMI) myocardial infarction: Secondary | ICD-10-CM | POA: Diagnosis not present

## 2017-03-17 DIAGNOSIS — Z951 Presence of aortocoronary bypass graft: Secondary | ICD-10-CM

## 2017-03-20 ENCOUNTER — Encounter (HOSPITAL_COMMUNITY)
Admission: RE | Admit: 2017-03-20 | Discharge: 2017-03-20 | Disposition: A | Discharge status: Home / Self Care | Payer: Medicare Other | Source: Ambulatory Visit | Attending: Cardiology | Admitting: Cardiology

## 2017-03-20 DIAGNOSIS — Z951 Presence of aortocoronary bypass graft: Secondary | ICD-10-CM

## 2017-03-20 DIAGNOSIS — I214 Non-ST elevation (NSTEMI) myocardial infarction: Secondary | ICD-10-CM | POA: Diagnosis not present

## 2017-03-20 LAB — GLUCOSE, CAPILLARY: GLUCOSE-CAPILLARY: 166 mg/dL — AB (ref 65–99)

## 2017-03-22 ENCOUNTER — Encounter (HOSPITAL_COMMUNITY)
Admission: RE | Admit: 2017-03-22 | Discharge: 2017-03-22 | Disposition: A | Discharge status: Home / Self Care | Payer: Medicare Other | Source: Ambulatory Visit | Attending: Cardiology | Admitting: Cardiology

## 2017-03-22 DIAGNOSIS — I214 Non-ST elevation (NSTEMI) myocardial infarction: Secondary | ICD-10-CM

## 2017-03-22 DIAGNOSIS — Z951 Presence of aortocoronary bypass graft: Secondary | ICD-10-CM

## 2017-03-22 LAB — GLUCOSE, CAPILLARY: GLUCOSE-CAPILLARY: 185 mg/dL — AB (ref 65–99)

## 2017-03-22 NOTE — Progress Notes (Signed)
Shawn Collins 66 y.o. male DOB: 09-07-1950 MRN: [id number]      Nutrition Note  1. NSTEMI (non-ST elevated myocardial infarction) ([place])   2. S/P CABG x 4    Meds reviewed. [place] noted    BMI 18.9   Note Spoke with pt. Nutrition Plan and Nutrition Survey goals reviewed with pt. Pt is following Step 1 of the Therapeutic Lifestyle Changes diet. Pt wt is down 23 lb over the past 3 months. Pt has maintained wt since starting rehab. Pt states he feels his appetite is improving. Pt educated re: High Calorie, High Protein diet. Pt is diabetic. No recent A1c noted. Pt reports his CBG's have improved since starting exercise. Pt checks CBG's 2-3 times a day. Pt expressed understanding of the information reviewed. Pt aware of nutrition education classes offered and plans on attending nutrition classes.  Nutrition Diagnosis ? Food-and nutrition-related knowledge deficit related to lack of exposure to information as related to diagnosis of: ? CVD ? DM  Nutrition Intervention ? Pt's individual nutrition plan and goals reviewed with pt. ? Pt given handouts for: ? High Calorie, High Protein diet ? Suggestions for Increasing Calories and Protein ? High Calorie, High Protein recipes  Nutrition Goal(s):  ? Improved glycemic control as evidenced by pt's A1c trending toward 7 or less.  ? Pt to identify food quantities necessary to achieve weight gain of 6-10 lb at graduation from cardiac rehab. Goal wt of 145 lb desired.  Plan:  Pt to attend nutrition classes ? Nutrition I ? Nutrition II ? Portion Distortion - met 03/15/17 ? Diabetes Blitz ? Diabetes Q & A - met 03/10/17 Will provide client-centered nutrition education as part of interdisciplinary care.   Monitor and evaluate progress toward nutrition goal with team.  Derek Mound, M.Ed, RD, LDN, CDE 03/22/2017 2:54 PM

## 2017-03-24 ENCOUNTER — Encounter (HOSPITAL_COMMUNITY)
Admission: RE | Admit: 2017-03-24 | Discharge: 2017-03-24 | Disposition: A | Discharge status: Home / Self Care | Payer: Medicare Other | Source: Ambulatory Visit | Attending: Cardiology | Admitting: Cardiology

## 2017-03-24 DIAGNOSIS — Z951 Presence of aortocoronary bypass graft: Secondary | ICD-10-CM

## 2017-03-24 DIAGNOSIS — I214 Non-ST elevation (NSTEMI) myocardial infarction: Secondary | ICD-10-CM | POA: Diagnosis not present

## 2017-03-27 ENCOUNTER — Encounter (HOSPITAL_COMMUNITY)
Admission: RE | Admit: 2017-03-27 | Discharge: 2017-03-27 | Disposition: A | Discharge status: Home / Self Care | Payer: Medicare Other | Source: Ambulatory Visit | Attending: Cardiology | Admitting: Cardiology

## 2017-03-27 DIAGNOSIS — I214 Non-ST elevation (NSTEMI) myocardial infarction: Secondary | ICD-10-CM

## 2017-03-27 DIAGNOSIS — Z951 Presence of aortocoronary bypass graft: Secondary | ICD-10-CM

## 2017-03-29 ENCOUNTER — Encounter (HOSPITAL_COMMUNITY)
Admission: RE | Admit: 2017-03-29 | Discharge: 2017-03-29 | Disposition: A | Discharge status: Home / Self Care | Payer: Medicare Other | Source: Ambulatory Visit | Attending: Cardiology | Admitting: Cardiology

## 2017-03-29 DIAGNOSIS — I214 Non-ST elevation (NSTEMI) myocardial infarction: Secondary | ICD-10-CM

## 2017-03-29 DIAGNOSIS — Z951 Presence of aortocoronary bypass graft: Secondary | ICD-10-CM

## 2017-03-31 ENCOUNTER — Encounter (HOSPITAL_COMMUNITY)
Admission: RE | Admit: 2017-03-31 | Discharge: 2017-03-31 | Disposition: A | Discharge status: Home / Self Care | Payer: Medicare Other | Source: Ambulatory Visit | Attending: Cardiology | Admitting: Cardiology

## 2017-03-31 DIAGNOSIS — N183 Chronic kidney disease, stage 3 (moderate): Secondary | ICD-10-CM | POA: Diagnosis not present

## 2017-03-31 DIAGNOSIS — Z87891 Personal history of nicotine dependence: Secondary | ICD-10-CM | POA: Insufficient documentation

## 2017-03-31 DIAGNOSIS — E785 Hyperlipidemia, unspecified: Secondary | ICD-10-CM | POA: Insufficient documentation

## 2017-03-31 DIAGNOSIS — Z951 Presence of aortocoronary bypass graft: Secondary | ICD-10-CM | POA: Diagnosis present

## 2017-03-31 DIAGNOSIS — E1122 Type 2 diabetes mellitus with diabetic chronic kidney disease: Secondary | ICD-10-CM | POA: Diagnosis not present

## 2017-03-31 DIAGNOSIS — Z79899 Other long term (current) drug therapy: Secondary | ICD-10-CM | POA: Insufficient documentation

## 2017-03-31 DIAGNOSIS — I129 Hypertensive chronic kidney disease with stage 1 through stage 4 chronic kidney disease, or unspecified chronic kidney disease: Secondary | ICD-10-CM | POA: Diagnosis not present

## 2017-03-31 DIAGNOSIS — J439 Emphysema, unspecified: Secondary | ICD-10-CM | POA: Diagnosis not present

## 2017-03-31 DIAGNOSIS — I214 Non-ST elevation (NSTEMI) myocardial infarction: Secondary | ICD-10-CM | POA: Diagnosis present

## 2017-03-31 DIAGNOSIS — Z7982 Long term (current) use of aspirin: Secondary | ICD-10-CM | POA: Diagnosis not present

## 2017-03-31 NOTE — Progress Notes (Signed)
Cardiac Individual Treatment Plan  Patient Details  Name: Shawn Collins MRN: [id number] Date of Birth: 1951-03-01 Referring Provider:     CARDIAC REHAB PHASE II ORIENTATION from 02/14/2017 in [place]  Referring Provider  Fransico Him MD      Initial Encounter Date:    CARDIAC REHAB PHASE II ORIENTATION from 02/14/2017 in [place]  Date  02/14/17  Referring Provider  Fransico Him MD      Visit Diagnosis: NSTEMI (non-ST elevated myocardial infarction) ([place])  S/P CABG x 4  Patient's Home Medications on Admission:  Current Outpatient Prescriptions:  .  amLODipine (NORVASC) 10 MG tablet, Take 1 tablet (10 mg total) by mouth daily., Disp: 30 tablet, Rfl: 0 .  aspirin EC 325 MG EC tablet, Take 1 tablet (325 mg total) by mouth daily., Disp: 30 tablet, Rfl: 0 .  Insulin Glargine (TOUJEO SOLOSTAR) 300 UNIT/ML SOPN, Inject 15 Units into the skin at bedtime. , Disp: , Rfl:  .  metoprolol tartrate (LOPRESSOR) 25 MG tablet, Take 0.5 tablets (12.5 mg total) by mouth 2 (two) times daily., Disp: 30 tablet, Rfl: 1 .  nitroGLYCERIN (NITROSTAT) 0.4 MG SL tablet, Place 1 tablet (0.4 mg total) under the tongue every 5 (five) minutes as needed., Disp: 25 tablet, Rfl: 3 .  rosuvastatin (CRESTOR) 10 MG tablet, Take 1 tablet (10 mg total) by mouth daily at 6 PM., Disp: 90 tablet, Rfl: 3  Past Medical History: Past Medical History:  Diagnosis Date  . CKD (chronic kidney disease) stage 3, GFR 30-59 ml/min (HCC) 09/2016  . Diabetes mellitus without complication ([place])   . Emphysema lung ([place])   . Hyperlipidemia   . Hypertension     Tobacco Use: History  Smoking Status  . Former Smoker  . Packs/day: 0.25  . Years: 8.00  . Types: Cigarettes  . Quit date: 1972  Smokeless Tobacco  . Never Used    Labs: Recent Review Flowsheet Data    Labs for ITP Cardiac and Pulmonary Rehab Latest Ref Rng & Units 10/26/2016 10/26/2016 10/27/2016  10/27/2016 01/17/2017   Cholestrol 100 - 199 mg/dL - - - - 148   LDLCALC 0 - 99 mg/dL - - - - 82   HDL >39 mg/dL - - - - 42   Trlycerides 0 - 149 mg/dL - - - - 122   Hemoglobin A1c 4.8 - 5.6 % - - - - -   PHART 7.350 - 7.450 7.453(H) - 7.377 - -   PCO2ART 32.0 - 48.0 mmHg 29.0(L) - 36.8 - -   HCO3 20.0 - 28.0 mmol/L 20.6 - 21.8 - -   TCO2 0 - 100 mmol/L 22 20 23 21  -   ACIDBASEDEF 0.0 - 2.0 mmol/L 3.0(H) - 3.0(H) - -   O2SAT % 99.0 - 98.0 - -      Capillary Blood Glucose: Lab Results  Component Value Date   GLUCAP 185 (H) 03/22/2017   GLUCAP 166 (H) 03/20/2017   GLUCAP 209 (H) 03/13/2017   GLUCAP 132 (H) 03/08/2017   GLUCAP 131 (H) 03/06/2017     Exercise Target Goals:    Exercise Program Goal: Individual exercise prescription set with THRR, safety & activity barriers. Participant demonstrates ability to understand and report RPE using BORG scale, to self-measure pulse accurately, and to acknowledge the importance of the exercise prescription.  Exercise Prescription Goal: Starting with aerobic activity 30 plus minutes a day, 3 days per week for initial exercise  prescription. Provide home exercise prescription and guidelines that participant acknowledges understanding prior to discharge.  Activity Barriers & Risk Stratification:     Activity Barriers & Cardiac Risk Stratification - 02/14/17 1346      Activity Barriers & Cardiac Risk Stratification   Activity Barriers Shortness of Breath;Muscular Weakness;Deconditioning   Cardiac Risk Stratification High      6 Minute Walk:     6 Minute Walk    Row Name 02/14/17 1608         6 Minute Walk   Phase Initial     Distance 1600 feet     Walk Time 6 minutes     # of Rest Breaks 0     MPH 3.03     METS 3.7     RPE 13     Symptoms No     Resting HR 65 bpm     Resting BP 100/64     Resting Oxygen Saturation  100 %     Exercise Oxygen Saturation  during 6 min walk 100 %     Max Ex. HR 83 bpm     Max Ex. BP 116/60      2 Minute Post BP 114/70        Oxygen Initial Assessment:   Oxygen Re-Evaluation:   Oxygen Discharge (Final Oxygen Re-Evaluation):   Initial Exercise Prescription:     Initial Exercise Prescription - 02/14/17 1600      Date of Initial Exercise RX and Referring Provider   Date 02/14/17   Referring Provider Fransico Him MD     Treadmill   MPH 2.5   Grade 0   Minutes 15   METs 2.91     Bike   Level 0.5   Minutes 15   METs 2.45     NuStep   Level 3   SPM 80   Minutes 15   METs 2     Prescription Details   Frequency (times per week) 3   Duration Progress to 30 minutes of continuous aerobic without signs/symptoms of physical distress     Intensity   THRR 40-80% of Max Heartrate 61-122   Ratings of Perceived Exertion 11-13   Perceived Dyspnea 0-4     Progression   Progression Continue to progress workloads to maintain intensity without signs/symptoms of physical distress.     Resistance Training   Training Prescription Yes   Weight 2lbs   Reps 10-15      Perform Capillary Blood Glucose checks as needed.  Exercise Prescription Changes:     Exercise Prescription Changes    Row Name 02/22/17 1500 03/07/17 1600 03/20/17 1600         Response to Exercise   Blood Pressure (Admit) 130/72 116/70 104/64     Blood Pressure (Exercise) 134/60 158/80 144/80     Blood Pressure (Exit) 130/82 114/70 120/70     Heart Rate (Admit) 95 bpm 74 bpm 85 bpm     Heart Rate (Exercise) 103 bpm 102 bpm 97 bpm     Heart Rate (Exit) 73 bpm 74 bpm 65 bpm     Rating of Perceived Exertion (Exercise) 13 13 12      Symptoms fatigue/deconditioned  -  -     Comments encourage pt to take rest breaks encourage pt to take rest breaks encourage pt to take rest breaks     Duration Progress to 30 minutes of  aerobic without signs/symptoms of physical distress Progress to 30 minutes of  aerobic without signs/symptoms of physical distress Progress to 30 minutes of  aerobic without  signs/symptoms of physical distress     Intensity THRR unchanged THRR unchanged THRR unchanged       Progression   Progression Continue to progress workloads to maintain intensity without signs/symptoms of physical distress. Continue to progress workloads to maintain intensity without signs/symptoms of physical distress. Continue to progress workloads to maintain intensity without signs/symptoms of physical distress.     Average METs 2.8 2.7 2.8       Resistance Training   Training Prescription No Yes Yes     Weight  - 3lbs 4lbs     Reps  - 10-15 10-15     Time  - 10 Minutes 10 Minutes       Treadmill   MPH 2.5 2.5  -     Grade 0 0  -     Minutes 15 15  -     METs 2.91 2.91  -       Bike   Level  - 0.5 0.5     Minutes  - 15 15     METs  - 2.5 2.5       NuStep   Level 3  - 4     SPM 80  - 80     Minutes 15  - 15     METs 2.8  - 3.2       Home Exercise Plan   Plans to continue exercise at  - Home (comment) Home (comment)     Frequency  - Add 2 additional days to program exercise sessions. Add 2 additional days to program exercise sessions.     Initial Home Exercises Provided  - 03/03/17 03/03/17        Exercise Comments:     Exercise Comments    Row Name 02/22/17 1519 03/07/17 1636 03/29/17 1114       Exercise Comments pt was oriented to exercise equipment today. Pt responded to exercise prescription. Encouraged pt to take rest breaks to avoid extreme fatigue/exhaution. Will continue to monitor pt's progress. Reviewed METs and goals. Pt is tolerating exercise fairly well; will continue to monitor pt's activity levels and exercise progression.  Reviewed METs and goals. Pt is tolerating exercise fairly well; will continue to monitor pt's activity levels and exercise progression.         Exercise Goals and Review:     Exercise Goals    Row Name 02/14/17 1348             Exercise Goals   Increase Physical Activity Yes       Intervention Provide advice, education,  support and counseling about physical activity/exercise needs.;Develop an individualized exercise prescription for aerobic and resistive training based on initial evaluation findings, risk stratification, comorbidities and participant's personal goals.       Expected Outcomes Achievement of increased cardiorespiratory fitness and enhanced flexibility, muscular endurance and strength shown through measurements of functional capacity and personal statement of participant.       Increase Strength and Stamina Yes  improve walking tolerance, be able to climb stairs without SOB and improve muscle mass       Intervention Provide advice, education, support and counseling about physical activity/exercise needs.;Develop an individualized exercise prescription for aerobic and resistive training based on initial evaluation findings, risk stratification, comorbidities and participant's personal goals.       Expected Outcomes Achievement of increased cardiorespiratory fitness and enhanced flexibility, muscular endurance  and strength shown through measurements of functional capacity and personal statement of participant.       Able to understand and use rate of perceived exertion (RPE) scale Yes       Intervention Provide education and explanation on how to use RPE scale       Expected Outcomes Short Term: Able to use RPE daily in rehab to express subjective intensity level;Long Term:  Able to use RPE to guide intensity level when exercising independently       Knowledge and understanding of Target Heart Rate Range (THRR) Yes       Intervention Provide education and explanation of THRR including how the numbers were predicted and where they are located for reference       Expected Outcomes Short Term: Able to state/look up THRR;Long Term: Able to use THRR to govern intensity when exercising independently;Short Term: Able to use daily as guideline for intensity in rehab       Able to check pulse independently Yes        Intervention Provide education and demonstration on how to check pulse in carotid and radial arteries.;Review the importance of being able to check your own pulse for safety during independent exercise       Expected Outcomes Short Term: Able to explain why pulse checking is important during independent exercise;Long Term: Able to check pulse independently and accurately       Understanding of Exercise Prescription Yes       Intervention Provide education, explanation, and written materials on patient's individual exercise prescription       Expected Outcomes Short Term: Able to explain program exercise prescription;Long Term: Able to explain home exercise prescription to exercise independently          Exercise Goals Re-Evaluation :     Exercise Goals Re-Evaluation    Row Name 03/03/17 1419 03/29/17 1114           Exercise Goal Re-Evaluation   Exercise Goals Review Increase Physical Activity;Able to understand and use rate of perceived exertion (RPE) scale;Knowledge and understanding of Target Heart Rate Range (THRR);Understanding of Exercise Prescription;Able to check pulse independently;Increase Strength and Stamina Increase Physical Activity;Able to understand and use rate of perceived exertion (RPE) scale;Knowledge and understanding of Target Heart Rate Range (THRR);Understanding of Exercise Prescription;Able to check pulse independently;Increase Strength and Stamina      Comments Reviewed home exercise with pt today.  Pt plans to walk  for exercise.  Reviewed THR, pulse, RPE, sign and symptoms, and when to call 911 or MD.  Also discussed weather considerations and indoor options.  Pt voiced understanding. pt is compliant with HEP and is walking at home for 30 minutes, 2-3x/week without difficulty.       Expected Outcomes Pt will be compliant with HEP and improve in cardiorespiratory fitness. Pt will be compliant with HEP and improve in cardiorespiratory fitness.          Discharge  Exercise Prescription (Final Exercise Prescription Changes):     Exercise Prescription Changes - 03/20/17 1600      Response to Exercise   Blood Pressure (Admit) 104/64   Blood Pressure (Exercise) 144/80   Blood Pressure (Exit) 120/70   Heart Rate (Admit) 85 bpm   Heart Rate (Exercise) 97 bpm   Heart Rate (Exit) 65 bpm   Rating of Perceived Exertion (Exercise) 12   Comments encourage pt to take rest breaks   Duration Progress to 30 minutes of  aerobic without signs/symptoms  of physical distress   Intensity THRR unchanged     Progression   Progression Continue to progress workloads to maintain intensity without signs/symptoms of physical distress.   Average METs 2.8     Resistance Training   Training Prescription Yes   Weight 4lbs   Reps 10-15   Time 10 Minutes     Bike   Level 0.5   Minutes 15   METs 2.5     NuStep   Level 4   SPM 80   Minutes 15   METs 3.2     Home Exercise Plan   Plans to continue exercise at Home (comment)   Frequency Add 2 additional days to program exercise sessions.   Initial Home Exercises Provided 03/03/17      Nutrition:  Target Goals: Understanding of nutrition guidelines, daily intake of sodium 1500mg , cholesterol 200mg , calories 30% from fat and 7% or less from saturated fats, daily to have 5 or more servings of fruits and vegetables.  Biometrics:     Pre Biometrics - 03/07/17 1410      Pre Biometrics   Height 5' 8.5" (1.74 m)   Weight 138 lb 3.7 oz (62.7 kg)   Waist Circumference 30.5 inches   Hip Circumference 35.5 inches   Waist to Hip Ratio 0.86 %   BMI (Calculated) 20.71   Triceps Skinfold 222 mm   % Body Fat 22.1 %   Grip Strength 37 kg   Flexibility 8.25 in   Single Leg Stand 5.75 seconds       Nutrition Therapy Plan and Nutrition Goals:     Nutrition Therapy & Goals - 02/14/17 1526      Nutrition Therapy   Diet Carb Modified, Therapeutic Lifestyle Changes     Personal Nutrition Goals   Nutrition Goal  Improved glycemic control as evidenced by pt's A1c trending toward 7 or less.    Personal Goal #2 Pt to identify food quantities necessary to achieve weight gain of 6-10 lb at graduation from cardiac rehab. Goal wt of 145 lb desired.      Intervention Plan   Intervention Prescribe, educate and counsel regarding individualized specific dietary modifications aiming towards targeted core components such as weight, hypertension, lipid management, diabetes, heart failure and other comorbidities.   Expected Outcomes Short Term Goal: Understand basic principles of dietary content, such as calories, fat, sodium, cholesterol and nutrients.;Long Term Goal: Adherence to prescribed nutrition plan.      Nutrition Discharge: Nutrition Scores:     Nutrition Assessments - 02/14/17 1515      MEDFICTS Scores   Pre Score 40      Nutrition Goals Re-Evaluation:   Nutrition Goals Re-Evaluation:   Nutrition Goals Discharge (Final Nutrition Goals Re-Evaluation):   Psychosocial: Target Goals: Acknowledge presence or absence of significant depression and/or stress, maximize coping skills, provide positive support system. Participant is able to verbalize types and ability to use techniques and skills needed for reducing stress and depression.  Initial Review & Psychosocial Screening:     Initial Psych Review & Screening - 02/14/17 1536      Initial Review   Current issues with None Identified     Family Dynamics   Good Support System? Yes  family friends    Comments upon brief assessment, no psychosocial needs identified, no interventions necessary      Barriers   Psychosocial barriers to participate in program There are no identifiable barriers or psychosocial needs.     Screening Interventions  Interventions Encouraged to exercise      Quality of Life Scores:     Quality of Life - 03/22/17 1552      Quality of Life Scores   Health/Function Pre --  pt reports symptoms are improving  with CR activities.    GLOBAL Pre --  overall pt has no specific concerns or worries. pt offered emotional support and reassurance. pt exhbiits positive out.ook with good coping skils.       PHQ-9: Recent Review Flowsheet Data    Depression screen [facility] 2/9 02/22/2017   Decreased Interest 0   Down, Depressed, Hopeless 0   PHQ - 2 Score 0     Interpretation of Total Score  Total Score Depression Severity:  1-4 = Minimal depression, 5-9 = Mild depression, 10-14 = Moderate depression, 15-19 = Moderately severe depression, 20-27 = Severe depression   Psychosocial Evaluation and Intervention:     Psychosocial Evaluation - 02/22/17 1633      Psychosocial Evaluation & Interventions   Interventions Encouraged to exercise with the program and follow exercise prescription   Comments no psychosocial needs identified, no interventions necessary    Expected Outcomes pt will exhibit positive outlook with good coping skills.    Continue Psychosocial Services  No Follow up required      Psychosocial Re-Evaluation:     Psychosocial Re-Evaluation    [place] Name 03/07/17 0715 03/31/17 1153           Psychosocial Re-Evaluation   Current issues with None Identified None Identified      Comments no psychosocial needs identified, no interventions necessary  no psychosocial needs identified, no interventions necessary       Expected Outcomes pt will exhibit positive outlook with good coping skills.  pt will exhibit positive outlook with good coping skills.       Interventions Encouraged to attend Cardiac Rehabilitation for the exercise Encouraged to attend Cardiac Rehabilitation for the exercise      Continue Psychosocial Services  No Follow up required No Follow up required         Psychosocial Discharge (Final Psychosocial Re-Evaluation):     Psychosocial Re-Evaluation - 03/31/17 1153      Psychosocial Re-Evaluation   Current issues with None Identified   Comments no psychosocial needs  identified, no interventions necessary    Expected Outcomes pt will exhibit positive outlook with good coping skills.    Interventions Encouraged to attend Cardiac Rehabilitation for the exercise   Continue Psychosocial Services  No Follow up required      Vocational Rehabilitation: Provide vocational rehab assistance to qualifying candidates.   Vocational Rehab Evaluation & Intervention:     Vocational Rehab - 02/14/17 1536      Initial Vocational Rehab Evaluation & Intervention   Assessment shows need for Vocational Rehabilitation Yes      Education: Education Goals: Education classes will be provided on a weekly basis, covering required topics. Participant will state understanding/return demonstration of topics presented.  Learning Barriers/Preferences:     Learning Barriers/Preferences - 02/14/17 1346      Learning Barriers/Preferences   Learning Barriers Sight   Learning Preferences Written Material      Education Topics: Count Your Pulse:  -Group instruction provided by verbal instruction, demonstration, patient participation and written materials to support subject.  Instructors address importance of being able to find your pulse and how to count your pulse when at home without a heart monitor.  Patients get hands on experience counting their  pulse with staff help and individually.   Heart Attack, Angina, and Risk Factor Modification:  -Group instruction provided by verbal instruction, video, and written materials to support subject.  Instructors address signs and symptoms of angina and heart attacks.    Also discuss risk factors for heart disease and how to make changes to improve heart health risk factors.   Functional Fitness:  -Group instruction provided by verbal instruction, demonstration, patient participation, and written materials to support subject.  Instructors address safety measures for doing things around the house.  Discuss how to get up and down off  the floor, how to pick things up properly, how to safely get out of a chair without assistance, and balance training.   Meditation and Mindfulness:  -Group instruction provided by verbal instruction, patient participation, and written materials to support subject.  Instructor addresses importance of mindfulness and meditation practice to help reduce stress and improve awareness.  Instructor also leads participants through a meditation exercise.    Stretching for Flexibility and Mobility:  -Group instruction provided by verbal instruction, patient participation, and written materials to support subject.  Instructors lead participants through series of stretches that are designed to increase flexibility thus improving mobility.  These stretches are additional exercise for major muscle groups that are typically performed during regular warm up and cool down.   CARDIAC REHAB PHASE II EXERCISE from 03/22/2017 in [place]  Date  03/22/17  Instruction Review Code  2- meets goals/outcomes      Hands Only CPR:  -Group verbal, video, and participation provides a basic overview of AHA guidelines for community CPR. Role-play of emergencies allow participants the opportunity to practice calling for help and chest compression technique with discussion of AED use.   Hypertension: -Group verbal and written instruction that provides a basic overview of hypertension including the most recent diagnostic guidelines, risk factor reduction with self-care instructions and medication management.    Nutrition I class: Heart Healthy Eating:  -Group instruction provided by PowerPoint slides, verbal discussion, and written materials to support subject matter. The instructor gives an explanation and review of the Therapeutic Lifestyle Changes diet recommendations, which includes a discussion on lipid goals, dietary fat, sodium, fiber, plant stanol/sterol esters, sugar, and the  components of a well-balanced, healthy diet.   Nutrition II class: Lifestyle Skills:  -Group instruction provided by PowerPoint slides, verbal discussion, and written materials to support subject matter. The instructor gives an explanation and review of label reading, grocery shopping for heart health, heart healthy recipe modifications, and ways to make healthier choices when eating out.   Diabetes Question & Answer:  -Group instruction provided by PowerPoint slides, verbal discussion, and written materials to support subject matter. The instructor gives an explanation and review of diabetes co-morbidities, pre- and post-prandial blood glucose goals, pre-exercise blood glucose goals, signs, symptoms, and treatment of hypoglycemia and hyperglycemia, and foot care basics.   CARDIAC REHAB PHASE II EXERCISE from 03/22/2017 in [place]  Date  03/10/17  Educator  RD  Instruction Review Code  2- meets goals/outcomes      Diabetes Blitz:  -Group instruction provided by PowerPoint slides, verbal discussion, and written materials to support subject matter. The instructor gives an explanation and review of the physiology behind type 1 and type 2 diabetes, diabetes medications and rational behind using different medications, pre- and post-prandial blood glucose recommendations and Hemoglobin A1c goals, diabetes diet, and exercise including blood glucose guidelines for exercising  safely.    Portion Distortion:  -Group instruction provided by PowerPoint slides, verbal discussion, written materials, and food models to support subject matter. The instructor gives an explanation of serving size versus portion size, changes in portions sizes over the last 20 years, and what consists of a serving from each food group.   CARDIAC REHAB PHASE II EXERCISE from 03/22/2017 in [place]  Date  03/15/17  Educator  RD  Instruction Review Code  2- meets  goals/outcomes      Stress Management:  -Group instruction provided by verbal instruction, video, and written materials to support subject matter.  Instructors review role of stress in heart disease and how to cope with stress positively.     Exercising on Your Own:  -Group instruction provided by verbal instruction, power point, and written materials to support subject.  Instructors discuss benefits of exercise, components of exercise, frequency and intensity of exercise, and end points for exercise.  Also discuss use of nitroglycerin and activating EMS.  Review options of places to exercise outside of rehab.  Review guidelines for sex with heart disease.   Cardiac Drugs I:  -Group instruction provided by verbal instruction and written materials to support subject.  Instructor reviews cardiac drug classes: antiplatelets, anticoagulants, beta blockers, and statins.  Instructor discusses reasons, side effects, and lifestyle considerations for each drug class.   Cardiac Drugs II:  -Group instruction provided by verbal instruction and written materials to support subject.  Instructor reviews cardiac drug classes: angiotensin converting enzyme inhibitors (ACE-I), angiotensin II receptor blockers (ARBs), nitrates, and calcium channel blockers.  Instructor discusses reasons, side effects, and lifestyle considerations for each drug class.   CARDIAC REHAB PHASE II EXERCISE from 03/22/2017 in [place]  Date  03/08/17  Instruction Review Code  2- meets goals/outcomes      Anatomy and Physiology of the Circulatory System:  Group verbal and written instruction and models provide basic cardiac anatomy and physiology, with the coronary electrical and arterial systems. Review of: AMI, Angina, Valve disease, Heart Failure, Peripheral Artery Disease, Cardiac Arrhythmia, Pacemakers, and the ICD.   CARDIAC REHAB PHASE II EXERCISE from 03/22/2017 in [place]  Date  02/22/17  Instruction Review Code  2- meets goals/outcomes      Other Education:  -Group or individual verbal, written, or video instructions that support the educational goals of the cardiac rehab program.   Knowledge Questionnaire Score:     Knowledge Questionnaire Score - 02/14/17 1535      Knowledge Questionnaire Score   Pre Score 18/24      Core Components/Risk Factors/Patient Goals at Admission:     Personal Goals and Risk Factors at Admission - 02/14/17 1615      Core Components/Risk Factors/Patient Goals on Admission   Diabetes Yes   Intervention Provide education about signs/symptoms and action to take for hypo/hyperglycemia.;Provide education about proper nutrition, including hydration, and aerobic/resistive exercise prescription along with prescribed medications to achieve blood glucose in normal ranges: Fasting glucose 65-99 mg/dL   Expected Outcomes Short Term: Participant verbalizes understanding of the signs/symptoms and immediate care of hyper/hypoglycemia, proper foot care and importance of medication, aerobic/resistive exercise and nutrition plan for blood glucose control.;Long Term: Attainment of HbA1C < 7%.   Hypertension Yes   Intervention Provide education on lifestyle modifcations including regular physical activity/exercise, weight management, moderate sodium restriction and increased consumption of fresh fruit, vegetables, and low fat dairy, alcohol  moderation, and smoking cessation.;Monitor prescription use compliance.   Expected Outcomes Short Term: Continued assessment and intervention until BP is < 140/22mm HG in hypertensive participants. < 130/63mm HG in hypertensive participants with diabetes, heart failure or chronic kidney disease.;Long Term: Maintenance of blood pressure at goal levels.   Lipids Yes   Intervention Provide education and support for participant on nutrition & aerobic/resistive exercise along with  prescribed medications to achieve LDL 70mg , HDL >40mg .   Expected Outcomes Short Term: Participant states understanding of desired cholesterol values and is compliant with medications prescribed. Participant is following exercise prescription and nutrition guidelines.;Long Term: Cholesterol controlled with medications as prescribed, with individualized exercise RX and with personalized nutrition plan. Value goals: LDL < 70mg , HDL > 40 mg.      Core Components/Risk Factors/Patient Goals Review:      Goals and Risk Factor Review    Row Name 03/07/17 0713 03/07/17 1410 03/31/17 1153         Core Components/Risk Factors/Patient Goals Review   Personal Goals Review Diabetes;Hypertension;Lipids - Diabetes;Hypertension;Lipids     Review pt with multiple CAD RF demonstrates willingness to participate in CR activities.  Blood sugar and blood pressure WNL. - pt with multiple CAD RF demonstrates willingness to participate in CR activities.  Blood sugar and blood pressure  WNL.  pt notes increase stamina     Expected Outcomes pt will participate in CR exercise, nutrition and lifestyle modification to reduce overall RF.  - pt will participate in CR exercise, nutrition and lifestyle modification to reduce overall RF.         Core Components/Risk Factors/Patient Goals at Discharge (Final Review):      Goals and Risk Factor Review - 03/31/17 1153      Core Components/Risk Factors/Patient Goals Review   Personal Goals Review Diabetes;Hypertension;Lipids   Review pt with multiple CAD RF demonstrates willingness to participate in CR activities.  Blood sugar and blood pressure  WNL.  pt notes increase stamina   Expected Outcomes pt will participate in CR exercise, nutrition and lifestyle modification to reduce overall RF.       ITP Comments:     ITP Comments    Row Name 02/14/17 1345 03/07/17 0713 03/31/17 1153       ITP Comments Medical Director, Dr. Fransico Him 30 day ITP review.  pt with good  participation and attendance.  30 day ITP review.  pt with good participation and attendance.         Comments:

## 2017-04-03 ENCOUNTER — Encounter (HOSPITAL_COMMUNITY)
Admission: RE | Admit: 2017-04-03 | Discharge: 2017-04-03 | Disposition: A | Discharge status: Home / Self Care | Payer: Medicare Other | Source: Ambulatory Visit | Attending: Cardiology | Admitting: Cardiology

## 2017-04-03 DIAGNOSIS — Z951 Presence of aortocoronary bypass graft: Secondary | ICD-10-CM

## 2017-04-03 DIAGNOSIS — I214 Non-ST elevation (NSTEMI) myocardial infarction: Secondary | ICD-10-CM | POA: Diagnosis not present

## 2017-04-05 ENCOUNTER — Encounter (HOSPITAL_COMMUNITY)
Admission: RE | Admit: 2017-04-05 | Discharge: 2017-04-05 | Disposition: A | Discharge status: Home / Self Care | Payer: Medicare Other | Source: Ambulatory Visit | Attending: Cardiology | Admitting: Cardiology

## 2017-04-05 DIAGNOSIS — I214 Non-ST elevation (NSTEMI) myocardial infarction: Secondary | ICD-10-CM

## 2017-04-05 DIAGNOSIS — Z951 Presence of aortocoronary bypass graft: Secondary | ICD-10-CM

## 2017-04-07 ENCOUNTER — Encounter (HOSPITAL_COMMUNITY): Payer: Medicare Other

## 2017-04-10 ENCOUNTER — Encounter (HOSPITAL_COMMUNITY)
Admission: RE | Admit: 2017-04-10 | Discharge: 2017-04-10 | Disposition: A | Discharge status: Home / Self Care | Payer: Medicare Other | Source: Ambulatory Visit | Attending: Cardiology | Admitting: Cardiology

## 2017-04-10 DIAGNOSIS — I214 Non-ST elevation (NSTEMI) myocardial infarction: Secondary | ICD-10-CM | POA: Diagnosis not present

## 2017-04-10 DIAGNOSIS — Z951 Presence of aortocoronary bypass graft: Secondary | ICD-10-CM

## 2017-04-12 ENCOUNTER — Encounter (HOSPITAL_COMMUNITY)
Admission: RE | Admit: 2017-04-12 | Discharge: 2017-04-12 | Disposition: A | Discharge status: Home / Self Care | Payer: Medicare Other | Source: Ambulatory Visit | Attending: Cardiology | Admitting: Cardiology

## 2017-04-12 DIAGNOSIS — Z951 Presence of aortocoronary bypass graft: Secondary | ICD-10-CM

## 2017-04-12 DIAGNOSIS — I214 Non-ST elevation (NSTEMI) myocardial infarction: Secondary | ICD-10-CM

## 2017-04-14 ENCOUNTER — Encounter (HOSPITAL_COMMUNITY)
Admission: RE | Admit: 2017-04-14 | Discharge: 2017-04-14 | Disposition: A | Discharge status: Home / Self Care | Payer: Medicare Other | Source: Ambulatory Visit | Attending: Cardiology | Admitting: Cardiology

## 2017-04-14 DIAGNOSIS — I214 Non-ST elevation (NSTEMI) myocardial infarction: Secondary | ICD-10-CM

## 2017-04-14 DIAGNOSIS — Z951 Presence of aortocoronary bypass graft: Secondary | ICD-10-CM

## 2017-04-17 ENCOUNTER — Encounter (HOSPITAL_COMMUNITY): Payer: Medicare Other

## 2017-04-17 ENCOUNTER — Ambulatory Visit (INDEPENDENT_AMBULATORY_CARE_PROVIDER_SITE_OTHER): Payer: Medicare Other | Admitting: Cardiology

## 2017-04-17 ENCOUNTER — Encounter: Payer: Self-pay | Admitting: Cardiology

## 2017-04-17 VITALS — BP 140/80 | HR 67 | Ht 68.5 in | Wt 148.4 lb

## 2017-04-17 DIAGNOSIS — I25110 Atherosclerotic heart disease of native coronary artery with unstable angina pectoris: Secondary | ICD-10-CM

## 2017-04-17 DIAGNOSIS — I200 Unstable angina: Secondary | ICD-10-CM

## 2017-04-17 DIAGNOSIS — I10 Essential (primary) hypertension: Secondary | ICD-10-CM | POA: Diagnosis not present

## 2017-04-17 DIAGNOSIS — I2510 Atherosclerotic heart disease of native coronary artery without angina pectoris: Secondary | ICD-10-CM | POA: Insufficient documentation

## 2017-04-17 DIAGNOSIS — E785 Hyperlipidemia, unspecified: Secondary | ICD-10-CM | POA: Diagnosis not present

## 2017-04-17 HISTORY — DX: Hyperlipidemia, unspecified: E78.5

## 2017-04-17 LAB — GLUCOSE, CAPILLARY: GLUCOSE-CAPILLARY: 166 mg/dL — AB (ref 65–99)

## 2017-04-17 MED ORDER — ASPIRIN EC 81 MG PO TBEC: 81.0000 mg | DELAYED_RELEASE_TABLET | Freq: Every day | ORAL | 3 refills | Status: DC

## 2017-04-17 NOTE — Patient Instructions (Signed)
Medication Instructions:  Your physician has recommended you make the following change in your medication:   DECREASE: aspirin to 81 mg daily    Labwork: Your physician recommends that you return for lab work in: 1 week for liver function test and fasting lipids.    Testing/Procedures: None ordered   Follow-Up: Your physician wants you to follow-up in: 6 months with Dr. Radford Pax. You will receive a reminder letter in the mail two months in advance. If you don't receive a letter, please call our office to schedule the follow-up appointment.  Any Other Special Instructions Will Be Listed Below (If Applicable).     If you need a refill on your cardiac medications before your next appointment, please call your pharmacy.

## 2017-04-17 NOTE — Progress Notes (Signed)
Cardiology Office Note:    Date:  04/17/2017   ID:  Shawn Collins, DOB [birth year], MRN [id number]  PCP:  Nolene Ebbs, MD  Cardiologist:  Fransico Him, MD   Referring MD: Nolene Ebbs, MD   Chief Complaint  Patient presents with  . Follow-up    CAD, HTN and lipids    History of Present Illness:    Shawn Collins is a 66 y.o. male with a hx of ASCAD s/p NSTEMI with cath showing severe 3VD s/p  CABG x 3 with LIMA to LAD, SVG to OM1/OM2 and SVG D1 by Dr. Servando Snare on 10/26/16. He also has CKD, DM, HTN and HLD.  He is here today for followup and is doing well.  He denies any chest pain or pressure,  PND, orthopnea,  dizziness, palpitations or syncope. He denies any LE edema.  His SOB from pleural effusions post op have resolved.  He is compliant with his meds and is tolerating meds with no SE.     Past Medical History:  Diagnosis Date  . CAD (coronary artery disease), native coronary artery    s/p NSTEMI with cath showing severe 3VD s/p  CABG x 3 with LIMA to LAD, SVG to OM1/OM2 and SVG D1 by Dr. Servando Snare on 10/26/16.  . CKD (chronic kidney disease) stage 3, GFR 30-59 ml/min (HCC) 09/2016  . Diabetes mellitus without complication ([place])   . Emphysema lung ([place])   . Hyperlipidemia LDL goal <70 04/17/2017  . Hypertension     Past Surgical History:  Procedure Laterality Date  . CORONARY ARTERY BYPASS GRAFTING (CABG) x 4 USING LEFT INTERNAL MAMMARY ARTERY TO LAD AND ENDOSCOPICALLY HARVESTED GREATER SAPHENOUS VEIN TO OM 1, 2 AND TO DIAG N/A 10/26/2016   Performed by Grace Isaac, MD at [facility] 10/26/2016   Performed by Grace Isaac, MD at [facility] OR  . IR THORACENTESIS ASP PLEURAL SPACE W/IMG GUIDE  11/03/2016  . Left Heart Cath and Coronary Angiography N/A 10/21/2016   Performed by Martinique, Peter M, MD at [facility]  . TRANSESOPHAGEAL ECHOCARDIOGRAM (TEE) N/A 10/26/2016   Performed by Grace Isaac, MD at [facility] OR     Current Medications: Current Meds  Medication Sig  . amLODipine (NORVASC) 10 MG tablet Take 1 tablet (10 mg total) by mouth daily.  Marland Kitchen aspirin EC 325 MG EC tablet Take 1 tablet (325 mg total) by mouth daily.  . Insulin Glargine (TOUJEO SOLOSTAR) 300 UNIT/ML SOPN Inject 15 Units into the skin at bedtime.   . metoprolol tartrate (LOPRESSOR) 25 MG tablet Take 0.5 tablets (12.5 mg total) by mouth 2 (two) times daily.  . rosuvastatin (CRESTOR) 10 MG tablet Take 1 tablet (10 mg total) by mouth daily at 6 PM.     Allergies:   No known allergies   Social History   Socioeconomic History  . Marital status: Single    Spouse name: None  . Number of children: None  . Years of education: None  . Highest education level: None  Social Needs  . Financial resource strain: None  . Food insecurity - worry: None  . Food insecurity - inability: None  . Transportation needs - medical: None  . Transportation needs - non-medical: None  Occupational History  . None  Tobacco Use  . Smoking status: Former Smoker    Packs/day: 0.25    Years: 8.00    Pack years: 2.00  Types: Cigarettes    Last attempt to quit: 1972    Years since quitting: 46.9  . Smokeless tobacco: Never Used  Substance and Sexual Activity  . Alcohol use: No  . Drug use: No  . Sexual activity: None  Other Topics Concern  . None  Social History Narrative  . None     Family History: The patient's family history includes Diabetes Mellitus II in his mother and sister; Hypertension in his father; Stroke in his father.  ROS:   Please see the history of present illness.    ROS  All other systems reviewed and negative.   EKGs/Labs/Other Studies Reviewed:    The following studies were reviewed today: none  EKG:  EKG is not ordered today.   Recent Labs: 10/27/2016: Magnesium 2.0 11/01/2016: Hemoglobin 9.6; Platelets 277 11/04/2016: BUN 42; Creatinine, Ser 2.06; Potassium 4.3; Sodium 132 01/17/2017: ALT 27   Recent Lipid  Panel    Component Value Date/Time   CHOL 148 01/17/2017 1048   TRIG 122 01/17/2017 1048   HDL 42 01/17/2017 1048   CHOLHDL 3.5 01/17/2017 1048   CHOLHDL 3.3 10/16/2016 0512   VLDL 10 10/16/2016 0512   LDLCALC 82 01/17/2017 1048    Physical Exam:    VS:  BP 140/80   Pulse 67   Ht 5' 8.5" (1.74 m)   Wt 148 lb 6.4 oz (67.3 kg)   SpO2 97%   BMI 22.24 kg/m     Wt Readings from Last 3 Encounters:  04/17/17 148 lb 6.4 oz (67.3 kg)  03/07/17 138 lb 3.7 oz (62.7 kg)  02/14/17 138 lb 3.7 oz (62.7 kg)     GEN:  Well nourished, well developed in no acute distress HEENT: Normal NECK: No JVD; No carotid bruits LYMPHATICS: No lymphadenopathy CARDIAC: RRR, no murmurs, rubs, gallops RESPIRATORY:  Clear to auscultation without rales, wheezing or rhonchi  ABDOMEN: Soft, non-tender, non-distended MUSCULOSKELETAL:  No edema; No deformity  SKIN: Warm and dry NEUROLOGIC:  Alert and oriented x 3 PSYCHIATRIC:  Normal affect   ASSESSMENT:    1. Coronary artery disease involving native coronary artery of native heart with unstable angina pectoris ([place])   2. Essential hypertension   3. Hyperlipidemia LDL goal <70    PLAN:    In order of problems listed above:  1.  ASCAD - s/p NSTEMI with cath showing severe 3VD s/p  CABG x 3 with LIMA to LAD, SVG to OM1/OM2 and SVG D1 by Dr. Servando Snare on 10/26/16.  He is doing well and denies any anginal symptoms.  He will continue on ASA, BB and statin. I have asked him to decrease his ASA to 81mg  daily.    2.  HTN - BP is well controlled on exam today.  He will continue on amlodipine 10mg  daily, metoprolol 12.5mg  BID.  3. Hyperlipidemia with LDL goal < 70.  He will continue on crestor 10mg  daily.  I will repeat FLP and ALt.     Medication Adjustments/Labs and Tests Ordered: Current medicines are reviewed at length with the patient today.  Concerns regarding medicines are outlined above.  No orders of the defined types were placed in this  encounter.  No orders of the defined types were placed in this encounter.   Signed, Fransico Him, MD  04/17/2017 2:25 PM    [place]

## 2017-04-19 ENCOUNTER — Encounter (HOSPITAL_COMMUNITY)
Admission: RE | Admit: 2017-04-19 | Discharge: 2017-04-19 | Disposition: A | Discharge status: Home / Self Care | Payer: Medicare Other | Source: Ambulatory Visit | Attending: Cardiology | Admitting: Cardiology

## 2017-04-19 DIAGNOSIS — I214 Non-ST elevation (NSTEMI) myocardial infarction: Secondary | ICD-10-CM | POA: Diagnosis not present

## 2017-04-19 DIAGNOSIS — Z951 Presence of aortocoronary bypass graft: Secondary | ICD-10-CM

## 2017-04-19 LAB — GLUCOSE, CAPILLARY: Glucose-Capillary: 166 mg/dL — ABNORMAL HIGH (ref 65–99)

## 2017-04-21 ENCOUNTER — Encounter (HOSPITAL_COMMUNITY): Payer: Medicare Other

## 2017-04-24 ENCOUNTER — Encounter (HOSPITAL_COMMUNITY)
Admission: RE | Admit: 2017-04-24 | Discharge: 2017-04-24 | Disposition: A | Discharge status: Home / Self Care | Payer: Medicare Other | Source: Ambulatory Visit | Attending: Cardiology | Admitting: Cardiology

## 2017-04-24 DIAGNOSIS — I214 Non-ST elevation (NSTEMI) myocardial infarction: Secondary | ICD-10-CM

## 2017-04-24 DIAGNOSIS — Z951 Presence of aortocoronary bypass graft: Secondary | ICD-10-CM

## 2017-04-24 NOTE — Progress Notes (Signed)
OUTPATIENT CARDIAC REHAB  PMH:  10/26/2016 NSTEMI, CABGx3  Primary Cardiologist:  Dr. Fransico Him    Pt c/o occasional feeling unsteady and unbalanced after bumping into objects when walking. Pt states he has felt this way for several weeks.  Pt currently asymptomatic.  Pt able to exercise at cardiac rehab without difficulty. Orthostatic BP obtained.    BP:  144/73           HR:    68     Lying  BP:  151/77  HR:   68     Sitting  BP:  123/70   HR:   70     Standing   Pt denies dizziness/lightheadedness.  Pt instructed to increase PO water intake. Results forwarded to Dr. Radford Pax for recommendation.  Pt verbalized understanding. Andi Hence, RN, BSN Cardiac Pulmonary Rehab

## 2017-04-25 ENCOUNTER — Telehealth (HOSPITAL_COMMUNITY): Payer: Self-pay | Admitting: Cardiac Rehabilitation

## 2017-04-25 ENCOUNTER — Other Ambulatory Visit: Payer: Medicare Other

## 2017-04-25 NOTE — Telephone Encounter (Signed)
-----   Message from Sueanne Margarita, MD sent at 04/24/2017  6:54 PM EST ----- Regarding: RE: cardiac rehab  Please have patient followup with his PCP for this  Shawn Collins ----- Message ----- From: Lowell Guitar, RN Sent: 04/24/2017   4:41 PM To: Sueanne Margarita, MD Subject: cardiac rehab                                  Dear Dr. Radford Pax,  Pt c/o occasional feeling unsteady and unbalanced after bumping into objects when walking (ie accidentally bumping into countertop, etc).  Pt states he has felt this way for several weeks.  Pt currently asymptomatic.  Pt able to exercise at cardiac rehab without difficulty.  VS stable with exercise.  HR range:  80-109 with exercise, Peak BP:  160/92 with exercise.  Resting HR range:  68-80,  BP: 120-140s/70s.    Orthostatic BP obtained:  BP:  144/73     HR:  68     Lying  BP:  151/77  HR:   68     Sitting  BP:  123/70   HR:   70     Standing   Pt denies dizziness/lightheadedness.  Pt instructed to increase PO water intake. Looks like pt just saw you last week for office visit.  I don't think he thought to mention it to you then.   Please advise.   Thank you, Andi Hence, RN, BSN Cardiac Pulmonary Rehab

## 2017-04-26 ENCOUNTER — Encounter (HOSPITAL_COMMUNITY)
Admission: RE | Admit: 2017-04-26 | Discharge: 2017-04-26 | Disposition: A | Discharge status: Home / Self Care | Payer: Medicare Other | Source: Ambulatory Visit | Attending: Cardiology | Admitting: Cardiology

## 2017-04-26 ENCOUNTER — Other Ambulatory Visit: Payer: Medicare Other | Admitting: *Deleted

## 2017-04-26 DIAGNOSIS — I214 Non-ST elevation (NSTEMI) myocardial infarction: Secondary | ICD-10-CM

## 2017-04-26 DIAGNOSIS — I25110 Atherosclerotic heart disease of native coronary artery with unstable angina pectoris: Secondary | ICD-10-CM

## 2017-04-26 DIAGNOSIS — E785 Hyperlipidemia, unspecified: Secondary | ICD-10-CM

## 2017-04-26 DIAGNOSIS — Z951 Presence of aortocoronary bypass graft: Secondary | ICD-10-CM

## 2017-04-26 LAB — GLUCOSE, CAPILLARY: Glucose-Capillary: 147 mg/dL — ABNORMAL HIGH (ref 65–99)

## 2017-04-27 LAB — HEPATIC FUNCTION PANEL
ALK PHOS: 76 IU/L (ref 39–117)
ALT: 27 IU/L (ref 0–44)
AST: 31 IU/L (ref 0–40)
Albumin: 4.1 g/dL (ref 3.6–4.8)
BILIRUBIN, DIRECT: 0.1 mg/dL (ref 0.00–0.40)
Bilirubin Total: 0.3 mg/dL (ref 0.0–1.2)
TOTAL PROTEIN: 6.8 g/dL (ref 6.0–8.5)

## 2017-04-27 LAB — LIPID PANEL
CHOLESTEROL TOTAL: 121 mg/dL (ref 100–199)
Chol/HDL Ratio: 3 ratio (ref 0.0–5.0)
HDL: 40 mg/dL (ref >39)
LDL CALC: 63 mg/dL (ref 0–99)
Triglycerides: 91 mg/dL (ref 0–149)
VLDL Cholesterol Cal: 18 mg/dL (ref 5–40)

## 2017-04-28 ENCOUNTER — Encounter (HOSPITAL_COMMUNITY): Payer: Medicare Other

## 2017-04-28 ENCOUNTER — Ambulatory Visit (HOSPITAL_COMMUNITY): Payer: Self-pay | Admitting: Cardiac Rehabilitation

## 2017-04-28 DIAGNOSIS — Z951 Presence of aortocoronary bypass graft: Secondary | ICD-10-CM

## 2017-04-28 DIAGNOSIS — I214 Non-ST elevation (NSTEMI) myocardial infarction: Secondary | ICD-10-CM

## 2017-04-28 NOTE — Progress Notes (Signed)
Cardiac Individual Treatment Plan  Patient Details  Name: Jacobus Colvin MRN: [id number] Date of Birth: 07-Dec-1950 Referring Provider:     CARDIAC REHAB PHASE II ORIENTATION from 02/14/2017 in [place]  Referring Provider  Fransico Him MD      Initial Encounter Date:    CARDIAC REHAB PHASE II ORIENTATION from 02/14/2017 in [place]  Date  02/14/17  Referring Provider  Fransico Him MD      Visit Diagnosis: NSTEMI (non-ST elevated myocardial infarction) ([place])  S/P CABG x 4  Patient's Home Medications on Admission:  Current Outpatient Medications:  .  amLODipine (NORVASC) 10 MG tablet, Take 1 tablet (10 mg total) by mouth daily., Disp: 30 tablet, Rfl: 0 .  aspirin EC 81 MG tablet, Take 1 tablet (81 mg total) daily by mouth., Disp: 90 tablet, Rfl: 3 .  Insulin Glargine (TOUJEO SOLOSTAR) 300 UNIT/ML SOPN, Inject 15 Units into the skin at bedtime. , Disp: , Rfl:  .  metoprolol tartrate (LOPRESSOR) 25 MG tablet, Take 0.5 tablets (12.5 mg total) by mouth 2 (two) times daily., Disp: 30 tablet, Rfl: 1 .  nitroGLYCERIN (NITROSTAT) 0.4 MG SL tablet, Place 1 tablet (0.4 mg total) under the tongue every 5 (five) minutes as needed., Disp: 25 tablet, Rfl: 3 .  rosuvastatin (CRESTOR) 10 MG tablet, Take 1 tablet (10 mg total) by mouth daily at 6 PM., Disp: 90 tablet, Rfl: 3  Past Medical History: Past Medical History:  Diagnosis Date  . CAD (coronary artery disease), native coronary artery    s/p NSTEMI with cath showing severe 3VD s/p  CABG x 3 with LIMA to LAD, SVG to OM1/OM2 and SVG D1 by Dr. Servando Snare on 10/26/16.  . CKD (chronic kidney disease) stage 3, GFR 30-59 ml/min (HCC) 09/2016  . Diabetes mellitus without complication ([place])   . Emphysema lung ([place])   . Hyperlipidemia LDL goal <70 04/17/2017  . Hypertension     Tobacco Use: Social History   Tobacco Use  Smoking Status Former Smoker  . Packs/day: 0.25  . Years:  8.00  . Pack years: 2.00  . Types: Cigarettes  . Last attempt to quit: 1972  . Years since quitting: 46.9  Smokeless Tobacco Never Used    Labs: Recent Review Flowsheet Data    Labs for ITP Cardiac and Pulmonary Rehab Latest Ref Rng & Units 10/26/2016 10/27/2016 10/27/2016 01/17/2017 04/26/2017   Cholestrol 100 - 199 mg/dL - - - 148 121   LDLCALC 0 - 99 mg/dL - - - 82 63   HDL >39 mg/dL - - - 42 40   Trlycerides 0 - 149 mg/dL - - - 122 91   Hemoglobin A1c 4.8 - 5.6 % - - - - -   PHART 7.350 - 7.450 - 7.377 - - -   PCO2ART 32.0 - 48.0 mmHg - 36.8 - - -   HCO3 20.0 - 28.0 mmol/L - 21.8 - - -   TCO2 0 - 100 mmol/L 20 23 21  - -   ACIDBASEDEF 0.0 - 2.0 mmol/L - 3.0(H) - - -   O2SAT % - 98.0 - - -      Capillary Blood Glucose: Lab Results  Component Value Date   GLUCAP 147 (H) 04/26/2017   GLUCAP 166 (H) 04/19/2017   GLUCAP 166 (H) 04/14/2017   GLUCAP 185 (H) 03/22/2017   GLUCAP 166 (H) 03/20/2017     Exercise Target Goals:    Exercise Program  Goal: Individual exercise prescription set with THRR, safety & activity barriers. Participant demonstrates ability to understand and report RPE using BORG scale, to self-measure pulse accurately, and to acknowledge the importance of the exercise prescription.  Exercise Prescription Goal: Starting with aerobic activity 30 plus minutes a day, 3 days per week for initial exercise prescription. Provide home exercise prescription and guidelines that participant acknowledges understanding prior to discharge.  Activity Barriers & Risk Stratification:   6 Minute Walk:   Oxygen Initial Assessment:   Oxygen Re-Evaluation:   Oxygen Discharge (Final Oxygen Re-Evaluation):   Initial Exercise Prescription:   Perform Capillary Blood Glucose checks as needed.  Exercise Prescription Changes: Exercise Prescription Changes    Row Name 03/07/17 1600 03/20/17 1600 04/12/17 1546 04/24/17 1645       Response to Exercise   Blood Pressure  (Admit)  116/70  104/64  140/70  110/62    Blood Pressure (Exercise)  158/80  144/80  162/84  154/68    Blood Pressure (Exit)  114/70  120/70  138/82  108/60    Heart Rate (Admit)  74 bpm  85 bpm  77 bpm  66 bpm    Heart Rate (Exercise)  102 bpm  97 bpm  109 bpm  79 bpm    Heart Rate (Exit)  74 bpm  65 bpm  77 bpm  66 bpm    Rating of Perceived Exertion (Exercise)  13  12  11  13     Symptoms  -  -  None  None    Comments  encourage pt to take rest breaks  encourage pt to take rest breaks  -  -    Duration  Progress to 30 minutes of  aerobic without signs/symptoms of physical distress  Progress to 30 minutes of  aerobic without signs/symptoms of physical distress  Progress to 30 minutes of  aerobic without signs/symptoms of physical distress  Progress to 30 minutes of  aerobic without signs/symptoms of physical distress    Intensity  THRR unchanged  THRR unchanged  THRR unchanged  THRR unchanged      Progression   Progression  Continue to progress workloads to maintain intensity without signs/symptoms of physical distress.  Continue to progress workloads to maintain intensity without signs/symptoms of physical distress.  Continue to progress workloads to maintain intensity without signs/symptoms of physical distress.  Continue to progress workloads to maintain intensity without signs/symptoms of physical distress.    Average METs  2.7  2.8  3.1  3.7      Resistance Training   Training Prescription  Yes  Yes  No Relaxation Day   Yes    Weight  3lbs  4lbs  -  4lbs    Reps  10-15  10-15  -  10-15    Time  10 Minutes  10 Minutes  -  10 Minutes      Treadmill   MPH  2.5  -  2.7  2.8    Grade  0  -  2  3    Minutes  15  -  15  15    METs  2.91  -  3.81  4.3      Bike   Level  0.5  0.5  0.5  -    Minutes  15  15  15   -    METs  2.5  2.5  2.46  -      NuStep   Level  -  4  4  5    SPM  -  80  80  75    Minutes  -  15  15  15     METs  -  3.2  3.2  3.1      Home Exercise Plan   Plans to  continue exercise at  Home (comment)  Home (comment)  Home (comment)  Home (comment)    Frequency  Add 2 additional days to program exercise sessions.  Add 2 additional days to program exercise sessions.  Add 2 additional days to program exercise sessions.  Add 2 additional days to program exercise sessions.    Initial Home Exercises Provided  03/03/17  03/03/17  03/03/17  03/03/17       Exercise Comments: Exercise Comments    Row Name 03/07/17 1636 03/29/17 1114 04/25/17 1647       Exercise Comments  Reviewed METs and goals. Pt is tolerating exercise fairly well; will continue to monitor pt's activity levels and exercise progression.   Reviewed METs and goals. Pt is tolerating exercise fairly well; will continue to monitor pt's activity levels and exercise progression.   Reviewed METs and goals. Pt is tolerating exercise fairly well; will continue to monitor pt's activity levels and exercise progression.         Exercise Goals and Review:   Exercise Goals Re-Evaluation : Exercise Goals Re-Evaluation    Row Name 03/03/17 1419 03/29/17 1114 04/25/17 1647         Exercise Goal Re-Evaluation   Exercise Goals Review  Increase Physical Activity;Able to understand and use rate of perceived exertion (RPE) scale;Knowledge and understanding of Target Heart Rate Range (THRR);Understanding of Exercise Prescription;Able to check pulse independently;Increase Strength and Stamina  Increase Physical Activity;Able to understand and use rate of perceived exertion (RPE) scale;Knowledge and understanding of Target Heart Rate Range (THRR);Understanding of Exercise Prescription;Able to check pulse independently;Increase Strength and Stamina  Increase Physical Activity;Able to understand and use rate of perceived exertion (RPE) scale;Knowledge and understanding of Target Heart Rate Range (THRR);Understanding of Exercise Prescription;Able to check pulse independently;Increase Strength and Stamina     Comments   Reviewed home exercise with pt today.  Pt plans to walk  for exercise.  Reviewed THR, pulse, RPE, sign and symptoms, and when to call 911 or MD.  Also discussed weather considerations and indoor options.  Pt voiced understanding.  pt is compliant with HEP and is walking at home for 30 minutes, 2-3x/week without difficulty.   pt is active at home with chores and HEP. Pt has increase walking tolerance in cardiac rehab. Pt current speed on treadmill is 2.41mph/3%incline     Expected Outcomes  Pt will be compliant with HEP and improve in cardiorespiratory fitness.  Pt will be compliant with HEP and improve in cardiorespiratory fitness.  Pt will be compliant with HEP and improve in cardiorespiratory fitness.         Discharge Exercise Prescription (Final Exercise Prescription Changes): Exercise Prescription Changes - 04/24/17 1645      Response to Exercise   Blood Pressure (Admit)  110/62    Blood Pressure (Exercise)  154/68    Blood Pressure (Exit)  108/60    Heart Rate (Admit)  66 bpm    Heart Rate (Exercise)  79 bpm    Heart Rate (Exit)  66 bpm    Rating of Perceived Exertion (Exercise)  13    Symptoms  None    Duration  Progress to 30 minutes of  aerobic  without signs/symptoms of physical distress    Intensity  THRR unchanged      Progression   Progression  Continue to progress workloads to maintain intensity without signs/symptoms of physical distress.    Average METs  3.7      Resistance Training   Training Prescription  Yes    Weight  4lbs    Reps  10-15    Time  10 Minutes      Treadmill   MPH  2.8    Grade  3    Minutes  15    METs  4.3      NuStep   Level  5    SPM  75    Minutes  15    METs  3.1      Home Exercise Plan   Plans to continue exercise at  Home (comment)    Frequency  Add 2 additional days to program exercise sessions.    Initial Home Exercises Provided  03/03/17       Nutrition:  Target Goals: Understanding of nutrition guidelines, daily intake of  sodium 1500mg , cholesterol 200mg , calories 30% from fat and 7% or less from saturated fats, daily to have 5 or more servings of fruits and vegetables.  Biometrics: Pre Biometrics - 03/07/17 1410      Pre Biometrics   Height  5' 8.5" (1.74 m)    Weight  138 lb 3.7 oz (62.7 kg)    Waist Circumference  30.5 inches    Hip Circumference  35.5 inches    Waist to Hip Ratio  0.86 %    BMI (Calculated)  20.71    Triceps Skinfold  222 mm    % Body Fat  22.1 %    Grip Strength  37 kg    Flexibility  8.25 in    Single Leg Stand  5.75 seconds        Nutrition Therapy Plan and Nutrition Goals:   Nutrition Discharge: Nutrition Scores:   Nutrition Goals Re-Evaluation:   Nutrition Goals Re-Evaluation:   Nutrition Goals Discharge (Final Nutrition Goals Re-Evaluation):   Psychosocial: Target Goals: Acknowledge presence or absence of significant depression and/or stress, maximize coping skills, provide positive support system. Participant is able to verbalize types and ability to use techniques and skills needed for reducing stress and depression.  Initial Review & Psychosocial Screening:   Quality of Life Scores: Quality of Life - 03/22/17 1552      Quality of Life Scores   Health/Function Pre  -- pt reports symptoms are improving with CR activities.     GLOBAL Pre  -- overall pt has no specific concerns or worries. pt offered emotional support and reassurance. pt exhbiits positive out.ook with good coping skils.        PHQ-9: Recent Review Flowsheet Data    Depression screen [facility] 2/9 02/22/2017   Decreased Interest 0   Down, Depressed, Hopeless 0   PHQ - 2 Score 0     Interpretation of Total Score  Total Score Depression Severity:  1-4 = Minimal depression, 5-9 = Mild depression, 10-14 = Moderate depression, 15-19 = Moderately severe depression, 20-27 = Severe depression   Psychosocial Evaluation and Intervention:   Psychosocial Re-Evaluation: Psychosocial  Re-Evaluation    [place] Name 03/07/17 0715 03/31/17 1153 04/25/17 1651         Psychosocial Re-Evaluation   Current issues with  None Identified  None Identified  None Identified     Comments  no psychosocial  needs identified, no interventions necessary   no psychosocial needs identified, no interventions necessary   no psychosocial needs identified, no interventions necessary      Expected Outcomes  pt will exhibit positive outlook with good coping skills.   pt will exhibit positive outlook with good coping skills.   pt will exhibit positive outlook with good coping skills.      Interventions  Encouraged to attend Cardiac Rehabilitation for the exercise  Encouraged to attend Cardiac Rehabilitation for the exercise  Encouraged to attend Cardiac Rehabilitation for the exercise     Continue Psychosocial Services   No Follow up required  No Follow up required  No Follow up required        Psychosocial Discharge (Final Psychosocial Re-Evaluation): Psychosocial Re-Evaluation - 04/25/17 1651      Psychosocial Re-Evaluation   Current issues with  None Identified    Comments  no psychosocial needs identified, no interventions necessary     Expected Outcomes  pt will exhibit positive outlook with good coping skills.     Interventions  Encouraged to attend Cardiac Rehabilitation for the exercise    Continue Psychosocial Services   No Follow up required       Vocational Rehabilitation: Provide vocational rehab assistance to qualifying candidates.   Vocational Rehab Evaluation & Intervention:   Education: Education Goals: Education classes will be provided on a weekly basis, covering required topics. Participant will state understanding/return demonstration of topics presented.  Learning Barriers/Preferences:   Education Topics: Count Your Pulse:  -Group instruction provided by verbal instruction, demonstration, patient participation and written materials to support subject.  Instructors  address importance of being able to find your pulse and how to count your pulse when at home without a heart monitor.  Patients get hands on experience counting their pulse with staff help and individually.   CARDIAC REHAB PHASE II EXERCISE from 03/31/2017 in [place]  Date  03/31/17  Instruction Review Code  2- meets goals/outcomes      Heart Attack, Angina, and Risk Factor Modification:  -Group instruction provided by verbal instruction, video, and written materials to support subject.  Instructors address signs and symptoms of angina and heart attacks.    Also discuss risk factors for heart disease and how to make changes to improve heart health risk factors.   Functional Fitness:  -Group instruction provided by verbal instruction, demonstration, patient participation, and written materials to support subject.  Instructors address safety measures for doing things around the house.  Discuss how to get up and down off the floor, how to pick things up properly, how to safely get out of a chair without assistance, and balance training.   Meditation and Mindfulness:  -Group instruction provided by verbal instruction, patient participation, and written materials to support subject.  Instructor addresses importance of mindfulness and meditation practice to help reduce stress and improve awareness.  Instructor also leads participants through a meditation exercise.    Stretching for Flexibility and Mobility:  -Group instruction provided by verbal instruction, patient participation, and written materials to support subject.  Instructors lead participants through series of stretches that are designed to increase flexibility thus improving mobility.  These stretches are additional exercise for major muscle groups that are typically performed during regular warm up and cool down.   CARDIAC REHAB PHASE II EXERCISE from 03/31/2017 in [place]   Date  03/22/17  Instruction Review Code  2- meets goals/outcomes  Hands Only CPR:  -Group verbal, video, and participation provides a basic overview of AHA guidelines for community CPR. Role-play of emergencies allow participants the opportunity to practice calling for help and chest compression technique with discussion of AED use.   Hypertension: -Group verbal and written instruction that provides a basic overview of hypertension including the most recent diagnostic guidelines, risk factor reduction with self-care instructions and medication management.    Nutrition I class: Heart Healthy Eating:  -Group instruction provided by PowerPoint slides, verbal discussion, and written materials to support subject matter. The instructor gives an explanation and review of the Therapeutic Lifestyle Changes diet recommendations, which includes a discussion on lipid goals, dietary fat, sodium, fiber, plant stanol/sterol esters, sugar, and the components of a well-balanced, healthy diet.   Nutrition II class: Lifestyle Skills:  -Group instruction provided by PowerPoint slides, verbal discussion, and written materials to support subject matter. The instructor gives an explanation and review of label reading, grocery shopping for heart health, heart healthy recipe modifications, and ways to make healthier choices when eating out.   Diabetes Question & Answer:  -Group instruction provided by PowerPoint slides, verbal discussion, and written materials to support subject matter. The instructor gives an explanation and review of diabetes co-morbidities, pre- and post-prandial blood glucose goals, pre-exercise blood glucose goals, signs, symptoms, and treatment of hypoglycemia and hyperglycemia, and foot care basics.   CARDIAC REHAB PHASE II EXERCISE from 03/31/2017 in [place]  Date  03/10/17  Educator  RD  Instruction Review Code  2- meets goals/outcomes       Diabetes Blitz:  -Group instruction provided by PowerPoint slides, verbal discussion, and written materials to support subject matter. The instructor gives an explanation and review of the physiology behind type 1 and type 2 diabetes, diabetes medications and rational behind using different medications, pre- and post-prandial blood glucose recommendations and Hemoglobin A1c goals, diabetes diet, and exercise including blood glucose guidelines for exercising safely.    Portion Distortion:  -Group instruction provided by PowerPoint slides, verbal discussion, written materials, and food models to support subject matter. The instructor gives an explanation of serving size versus portion size, changes in portions sizes over the last 20 years, and what consists of a serving from each food group.   CARDIAC REHAB PHASE II EXERCISE from 03/31/2017 in [place]  Date  03/15/17  Educator  RD  Instruction Review Code  2- meets goals/outcomes      Stress Management:  -Group instruction provided by verbal instruction, video, and written materials to support subject matter.  Instructors review role of stress in heart disease and how to cope with stress positively.     Exercising on Your Own:  -Group instruction provided by verbal instruction, power point, and written materials to support subject.  Instructors discuss benefits of exercise, components of exercise, frequency and intensity of exercise, and end points for exercise.  Also discuss use of nitroglycerin and activating EMS.  Review options of places to exercise outside of rehab.  Review guidelines for sex with heart disease.   Cardiac Drugs I:  -Group instruction provided by verbal instruction and written materials to support subject.  Instructor reviews cardiac drug classes: antiplatelets, anticoagulants, beta blockers, and statins.  Instructor discusses reasons, side effects, and lifestyle considerations for each  drug class.   Cardiac Drugs II:  -Group instruction provided by verbal instruction and written materials to support subject.  Instructor reviews cardiac drug classes: angiotensin  converting enzyme inhibitors (ACE-I), angiotensin II receptor blockers (ARBs), nitrates, and calcium channel blockers.  Instructor discusses reasons, side effects, and lifestyle considerations for each drug class.   CARDIAC REHAB PHASE II EXERCISE from 03/31/2017 in [place]  Date  03/08/17  Instruction Review Code  2- meets goals/outcomes      Anatomy and Physiology of the Circulatory System:  Group verbal and written instruction and models provide basic cardiac anatomy and physiology, with the coronary electrical and arterial systems. Review of: AMI, Angina, Valve disease, Heart Failure, Peripheral Artery Disease, Cardiac Arrhythmia, Pacemakers, and the ICD.   CARDIAC REHAB PHASE II EXERCISE from 03/31/2017 in [place]  Date  02/22/17  Instruction Review Code  2- meets goals/outcomes      Other Education:  -Group or individual verbal, written, or video instructions that support the educational goals of the cardiac rehab program.   Knowledge Questionnaire Score:   Core Components/Risk Factors/Patient Goals at Admission:   Core Components/Risk Factors/Patient Goals Review:  Goals and Risk Factor Review    Row Name 03/07/17 0713 03/07/17 1410 03/31/17 1153 04/25/17 1650       Core Components/Risk Factors/Patient Goals Review   Personal Goals Review  Diabetes;Hypertension;Lipids  -  Diabetes;Hypertension;Lipids  Diabetes;Hypertension;Lipids;Tobacco Cessation    Review  pt with multiple CAD RF demonstrates willingness to participate in CR activities.  Blood sugar and blood pressure WNL.  -  pt with multiple CAD RF demonstrates willingness to participate in CR activities.  Blood sugar and blood pressure  WNL.  pt notes increase stamina  pt with  multiple CAD RF demonstrates willingness to participate in CR activities.  Blood sugar and blood pressure  WNL.  pt notes increase stamina.  pt voices balance concerns at home. reviewed with Dr. Radford Pax.     Expected Outcomes  pt will participate in CR exercise, nutrition and lifestyle modification to reduce overall RF.   -  pt will participate in CR exercise, nutrition and lifestyle modification to reduce overall RF.   pt will participate in CR exercise, nutrition and lifestyle modification to reduce overall RF.        Core Components/Risk Factors/Patient Goals at Discharge (Final Review):  Goals and Risk Factor Review - 04/25/17 1650      Core Components/Risk Factors/Patient Goals Review   Personal Goals Review  Diabetes;Hypertension;Lipids;Tobacco Cessation    Review  pt with multiple CAD RF demonstrates willingness to participate in CR activities.  Blood sugar and blood pressure  WNL.  pt notes increase stamina.  pt voices balance concerns at home. reviewed with Dr. Radford Pax.     Expected Outcomes  pt will participate in CR exercise, nutrition and lifestyle modification to reduce overall RF.        ITP Comments: ITP Comments    Row Name 03/07/17 0713 03/31/17 1153 04/25/17 1650       ITP Comments  30 day ITP review.  pt with good participation and attendance.   30 day ITP review.  pt with good participation and attendance.   30 day ITP review.  pt with good participation and attendance.         Comments:

## 2017-05-01 ENCOUNTER — Encounter (HOSPITAL_COMMUNITY)
Admission: RE | Admit: 2017-05-01 | Discharge: 2017-05-01 | Disposition: A | Discharge status: Home / Self Care | Payer: Medicare Other | Source: Ambulatory Visit | Attending: Cardiology | Admitting: Cardiology

## 2017-05-01 DIAGNOSIS — N183 Chronic kidney disease, stage 3 (moderate): Secondary | ICD-10-CM | POA: Insufficient documentation

## 2017-05-01 DIAGNOSIS — I129 Hypertensive chronic kidney disease with stage 1 through stage 4 chronic kidney disease, or unspecified chronic kidney disease: Secondary | ICD-10-CM | POA: Diagnosis not present

## 2017-05-01 DIAGNOSIS — Z87891 Personal history of nicotine dependence: Secondary | ICD-10-CM | POA: Insufficient documentation

## 2017-05-01 DIAGNOSIS — E785 Hyperlipidemia, unspecified: Secondary | ICD-10-CM | POA: Diagnosis not present

## 2017-05-01 DIAGNOSIS — Z951 Presence of aortocoronary bypass graft: Secondary | ICD-10-CM | POA: Insufficient documentation

## 2017-05-01 DIAGNOSIS — Z79899 Other long term (current) drug therapy: Secondary | ICD-10-CM | POA: Diagnosis not present

## 2017-05-01 DIAGNOSIS — I214 Non-ST elevation (NSTEMI) myocardial infarction: Secondary | ICD-10-CM | POA: Insufficient documentation

## 2017-05-01 DIAGNOSIS — J439 Emphysema, unspecified: Secondary | ICD-10-CM | POA: Insufficient documentation

## 2017-05-01 DIAGNOSIS — E1122 Type 2 diabetes mellitus with diabetic chronic kidney disease: Secondary | ICD-10-CM | POA: Diagnosis not present

## 2017-05-01 DIAGNOSIS — Z7982 Long term (current) use of aspirin: Secondary | ICD-10-CM | POA: Diagnosis not present

## 2017-05-01 LAB — GLUCOSE, CAPILLARY: GLUCOSE-CAPILLARY: 123 mg/dL — AB (ref 65–99)

## 2017-05-03 ENCOUNTER — Encounter (HOSPITAL_COMMUNITY)
Admission: RE | Admit: 2017-05-03 | Discharge: 2017-05-03 | Disposition: A | Discharge status: Home / Self Care | Payer: Medicare Other | Source: Ambulatory Visit | Attending: Cardiology | Admitting: Cardiology

## 2017-05-03 VITALS — Ht 68.5 in | Wt 145.1 lb

## 2017-05-03 DIAGNOSIS — I214 Non-ST elevation (NSTEMI) myocardial infarction: Secondary | ICD-10-CM | POA: Diagnosis not present

## 2017-05-03 DIAGNOSIS — Z951 Presence of aortocoronary bypass graft: Secondary | ICD-10-CM

## 2017-05-05 ENCOUNTER — Encounter (HOSPITAL_COMMUNITY)
Admission: RE | Admit: 2017-05-05 | Discharge: 2017-05-05 | Disposition: A | Discharge status: Home / Self Care | Payer: Medicare Other | Source: Ambulatory Visit | Attending: Cardiology | Admitting: Cardiology

## 2017-05-05 DIAGNOSIS — Z951 Presence of aortocoronary bypass graft: Secondary | ICD-10-CM

## 2017-05-05 DIAGNOSIS — I214 Non-ST elevation (NSTEMI) myocardial infarction: Secondary | ICD-10-CM

## 2017-05-08 ENCOUNTER — Encounter (HOSPITAL_COMMUNITY): Payer: Medicare Other

## 2017-05-10 ENCOUNTER — Encounter (HOSPITAL_COMMUNITY): Payer: Medicare Other

## 2017-05-11 ENCOUNTER — Telehealth: Payer: Self-pay | Admitting: Cardiology

## 2017-05-11 NOTE — Telephone Encounter (Signed)
Patient informed of lab results. Patient verbalized understanding and thanked me for the call.

## 2017-05-11 NOTE — Telephone Encounter (Signed)
Shawn Collins is calling about his lab results . Please call .Marland Kitchen Thanks

## 2017-05-12 ENCOUNTER — Encounter (HOSPITAL_COMMUNITY): Payer: Medicare Other

## 2017-05-15 ENCOUNTER — Encounter (HOSPITAL_COMMUNITY): Payer: Medicare Other

## 2017-05-17 ENCOUNTER — Encounter (HOSPITAL_COMMUNITY)
Admission: RE | Admit: 2017-05-17 | Discharge: 2017-05-17 | Disposition: A | Discharge status: Home / Self Care | Payer: Medicare Other | Source: Ambulatory Visit | Attending: Cardiology | Admitting: Cardiology

## 2017-05-17 DIAGNOSIS — Z951 Presence of aortocoronary bypass graft: Secondary | ICD-10-CM

## 2017-05-17 DIAGNOSIS — I214 Non-ST elevation (NSTEMI) myocardial infarction: Secondary | ICD-10-CM | POA: Diagnosis not present

## 2017-05-17 LAB — GLUCOSE, CAPILLARY: GLUCOSE-CAPILLARY: 85 mg/dL (ref 65–99)

## 2017-05-18 LAB — GLUCOSE, CAPILLARY: Glucose-Capillary: 87 mg/dL (ref 65–99)

## 2017-05-19 ENCOUNTER — Encounter (HOSPITAL_COMMUNITY): Payer: Medicare Other

## 2017-05-19 ENCOUNTER — Encounter (HOSPITAL_COMMUNITY)
Admission: RE | Admit: 2017-05-19 | Discharge: 2017-05-19 | Disposition: A | Discharge status: Home / Self Care | Payer: Medicare Other | Source: Ambulatory Visit | Attending: Cardiology | Admitting: Cardiology

## 2017-05-19 DIAGNOSIS — I214 Non-ST elevation (NSTEMI) myocardial infarction: Secondary | ICD-10-CM

## 2017-05-19 DIAGNOSIS — Z951 Presence of aortocoronary bypass graft: Secondary | ICD-10-CM

## 2017-05-22 ENCOUNTER — Encounter (HOSPITAL_COMMUNITY): Payer: Medicare Other

## 2017-05-24 ENCOUNTER — Encounter (HOSPITAL_COMMUNITY)
Admission: RE | Admit: 2017-05-24 | Discharge: 2017-05-24 | Disposition: A | Discharge status: Home / Self Care | Payer: Medicare Other | Source: Ambulatory Visit | Attending: Internal Medicine | Admitting: Internal Medicine

## 2017-05-24 ENCOUNTER — Encounter (HOSPITAL_COMMUNITY): Payer: Medicare Other

## 2017-05-24 DIAGNOSIS — I214 Non-ST elevation (NSTEMI) myocardial infarction: Secondary | ICD-10-CM | POA: Diagnosis not present

## 2017-05-24 DIAGNOSIS — Z951 Presence of aortocoronary bypass graft: Secondary | ICD-10-CM

## 2017-05-26 NOTE — Progress Notes (Signed)
Cardiac Individual Treatment Plan  Patient Details  Name: Shawn Collins MRN: [id number] Date of Birth: [birth year] Referring Provider:     CARDIAC REHAB PHASE II ORIENTATION from 02/14/2017 in [place]  Referring Provider  Fransico Him MD      Initial Encounter Date:    CARDIAC REHAB PHASE II ORIENTATION from 02/14/2017 in [place]  Date  02/14/17  Referring Provider  Fransico Him MD      Visit Diagnosis: NSTEMI (non-ST elevated myocardial infarction) ([place])  S/P CABG x 4  Patient's Home Medications on Admission:  Current Outpatient Medications:  .  amLODipine (NORVASC) 10 MG tablet, Take 1 tablet (10 mg total) by mouth daily., Disp: 30 tablet, Rfl: 0 .  aspirin EC 81 MG tablet, Take 1 tablet (81 mg total) daily by mouth., Disp: 90 tablet, Rfl: 3 .  Insulin Glargine (TOUJEO SOLOSTAR) 300 UNIT/ML SOPN, Inject 15 Units into the skin at bedtime. , Disp: , Rfl:  .  metoprolol tartrate (LOPRESSOR) 25 MG tablet, Take 0.5 tablets (12.5 mg total) by mouth 2 (two) times daily., Disp: 30 tablet, Rfl: 1 .  nitroGLYCERIN (NITROSTAT) 0.4 MG SL tablet, Place 1 tablet (0.4 mg total) under the tongue every 5 (five) minutes as needed., Disp: 25 tablet, Rfl: 3 .  rosuvastatin (CRESTOR) 10 MG tablet, Take 1 tablet (10 mg total) by mouth daily at 6 PM., Disp: 90 tablet, Rfl: 3  Past Medical History: Past Medical History:  Diagnosis Date  . CAD (coronary artery disease), native coronary artery    s/p NSTEMI with cath showing severe 3VD s/p  CABG x 3 with LIMA to LAD, SVG to OM1/OM2 and SVG D1 by Dr. Servando Snare on 10/26/16.  . CKD (chronic kidney disease) stage 3, GFR 30-59 ml/min (HCC) 09/2016  . Diabetes mellitus without complication ([place])   . Emphysema lung ([place])   . Hyperlipidemia LDL goal <70 04/17/2017  . Hypertension     Tobacco Use: Social History   Tobacco Use  Smoking Status Former Smoker  . Packs/day: 0.25  . Years:  8.00  . Pack years: 2.00  . Types: Cigarettes  . Last attempt to quit: 1972  . Years since quitting: 47.0  Smokeless Tobacco Never Used    Labs: Recent Review Flowsheet Data    Labs for ITP Cardiac and Pulmonary Rehab Latest Ref Rng & Units 10/26/2016 10/27/2016 10/27/2016 01/17/2017 04/26/2017   Cholestrol 100 - 199 mg/dL - - - 148 121   LDLCALC 0 - 99 mg/dL - - - 82 63   HDL >39 mg/dL - - - 42 40   Trlycerides 0 - 149 mg/dL - - - 122 91   Hemoglobin A1c 4.8 - 5.6 % - - - - -   PHART 7.350 - 7.450 - 7.377 - - -   PCO2ART 32.0 - 48.0 mmHg - 36.8 - - -   HCO3 20.0 - 28.0 mmol/L - 21.8 - - -   TCO2 0 - 100 mmol/L 20 23 21  - -   ACIDBASEDEF 0.0 - 2.0 mmol/L - 3.0(H) - - -   O2SAT % - 98.0 - - -      Capillary Blood Glucose: Lab Results  Component Value Date   GLUCAP 85 05/17/2017   GLUCAP 87 05/17/2017   GLUCAP 123 (H) 05/01/2017   GLUCAP 147 (H) 04/26/2017   GLUCAP 166 (H) 04/19/2017     Exercise Target Goals:    Exercise Program Goal: Individual  exercise prescription set with THRR, safety & activity barriers. Participant demonstrates ability to understand and report RPE using BORG scale, to self-measure pulse accurately, and to acknowledge the importance of the exercise prescription.  Exercise Prescription Goal: Starting with aerobic activity 30 plus minutes a day, 3 days per week for initial exercise prescription. Provide home exercise prescription and guidelines that participant acknowledges understanding prior to discharge.  Activity Barriers & Risk Stratification: Activity Barriers & Cardiac Risk Stratification - 02/14/17 1346      Activity Barriers & Cardiac Risk Stratification   Activity Barriers  Shortness of Breath;Muscular Weakness;Deconditioning    Cardiac Risk Stratification  High       6 Minute Walk: 6 Minute Walk    Row Name 02/14/17 1608 05/04/17 0704       6 Minute Walk   Phase  Initial  Discharge    Distance  1600 feet  1964 feet    Distance %  Change  -  18.53 %    Distance Feet Change  -  94 ft    Walk Time  6 minutes  6 minutes    # of Rest Breaks  0  0    MPH  3.03  3.72    METS  3.7  4.44    RPE  13  11    Perceived Dyspnea   -  0    VO2 Peak  -  15.5    Symptoms  No  No    Resting HR  65 bpm  63 bpm    Resting BP  100/64  102/64    Resting Oxygen Saturation   100 %  -    Exercise Oxygen Saturation  during 6 min walk  100 %  -    Max Ex. HR  83 bpm  95 bpm    Max Ex. BP  116/60  124/60    2 Minute Post BP  114/70  116/70       Oxygen Initial Assessment:   Oxygen Re-Evaluation:   Oxygen Discharge (Final Oxygen Re-Evaluation):   Initial Exercise Prescription: Initial Exercise Prescription - 02/14/17 1600      Date of Initial Exercise RX and Referring Provider   Date  02/14/17    Referring Provider  Fransico Him MD      Treadmill   MPH  2.5    Grade  0    Minutes  15    METs  2.91      Bike   Level  0.5    Minutes  15    METs  2.45      NuStep   Level  3    SPM  80    Minutes  15    METs  2      Prescription Details   Frequency (times per week)  3    Duration  Progress to 30 minutes of continuous aerobic without signs/symptoms of physical distress      Intensity   THRR 40-80% of Max Heartrate  61-122    Ratings of Perceived Exertion  11-13    Perceived Dyspnea  0-4      Progression   Progression  Continue to progress workloads to maintain intensity without signs/symptoms of physical distress.      Resistance Training   Training Prescription  Yes    Weight  2lbs    Reps  10-15       Perform Capillary Blood Glucose checks as needed.  Exercise Prescription Changes:  Exercise Prescription Changes    Row Name 02/22/17 1500 03/07/17 1600 03/20/17 1600 04/12/17 1546 04/24/17 1645     Response to Exercise   Blood Pressure (Admit)  130/72  116/70  104/64  140/70  110/62   Blood Pressure (Exercise)  134/60  158/80  144/80  162/84  154/68   Blood Pressure (Exit)  130/82  114/70  120/70   138/82  108/60   Heart Rate (Admit)  95 bpm  74 bpm  85 bpm  77 bpm  66 bpm   Heart Rate (Exercise)  103 bpm  102 bpm  97 bpm  109 bpm  79 bpm   Heart Rate (Exit)  73 bpm  74 bpm  65 bpm  77 bpm  66 bpm   Rating of Perceived Exertion (Exercise)  13  13  12  11  13    Symptoms  fatigue/deconditioned  -  -  None  None   Comments  encourage pt to take rest breaks  encourage pt to take rest breaks  encourage pt to take rest breaks  -  -   Duration  Progress to 30 minutes of  aerobic without signs/symptoms of physical distress  Progress to 30 minutes of  aerobic without signs/symptoms of physical distress  Progress to 30 minutes of  aerobic without signs/symptoms of physical distress  Progress to 30 minutes of  aerobic without signs/symptoms of physical distress  Progress to 30 minutes of  aerobic without signs/symptoms of physical distress   Intensity  THRR unchanged  THRR unchanged  THRR unchanged  THRR unchanged  THRR unchanged     Progression   Progression  Continue to progress workloads to maintain intensity without signs/symptoms of physical distress.  Continue to progress workloads to maintain intensity without signs/symptoms of physical distress.  Continue to progress workloads to maintain intensity without signs/symptoms of physical distress.  Continue to progress workloads to maintain intensity without signs/symptoms of physical distress.  Continue to progress workloads to maintain intensity without signs/symptoms of physical distress.   Average METs  2.8  2.7  2.8  3.1  3.7     Resistance Training   Training Prescription  No  Yes  Yes  No Relaxation Day   Yes   Weight  -  3lbs  4lbs  -  4lbs   Reps  -  10-15  10-15  -  10-15   Time  -  10 Minutes  10 Minutes  -  10 Minutes     Treadmill   MPH  2.5  2.5  -  2.7  2.8   Grade  0  0  -  2  3   Minutes  15  15  -  15  15   METs  2.91  2.91  -  3.81  4.3     Bike   Level  -  0.5  0.5  0.5  -   Minutes  -  15  15  15   -   METs  -  2.5   2.5  2.46  -     NuStep   Level  3  -  4  4  5    SPM  80  -  80  80  75   Minutes  15  -  15  15  15    METs  2.8  -  3.2  3.2  3.1     Home Exercise Plan   Plans to continue exercise at  -  Home (comment)  Home (comment)  Home (comment)  Home (comment)   Frequency  -  Add 2 additional days to program exercise sessions.  Add 2 additional days to program exercise sessions.  Add 2 additional days to program exercise sessions.  Add 2 additional days to program exercise sessions.   Initial Home Exercises Provided  -  03/03/17  03/03/17  03/03/17  03/03/17   Row Name 05/05/17 1651 05/17/17 1600           Response to Exercise   Blood Pressure (Admit)  120/68  128/68      Blood Pressure (Exercise)  140/78  128/80      Blood Pressure (Exit)  116/64  120/70      Heart Rate (Admit)  64 bpm  98 bpm      Heart Rate (Exercise)  87 bpm  89 bpm      Heart Rate (Exit)  64 bpm  71 bpm      Rating of Perceived Exertion (Exercise)  12  12      Symptoms  None  None      Duration  Progress to 30 minutes of  aerobic without signs/symptoms of physical distress  Progress to 30 minutes of  aerobic without signs/symptoms of physical distress      Intensity  THRR unchanged  THRR unchanged        Progression   Progression  Continue to progress workloads to maintain intensity without signs/symptoms of physical distress.  Continue to progress workloads to maintain intensity without signs/symptoms of physical distress.      Average METs  3.6  3.9        Resistance Training   Training Prescription  Yes  No relaxation day      Weight  3lbs  -      Reps  10-15  -      Time  10 Minutes  -        Bike   Level  1.2  1.2      Minutes  15  15      METs  4.44  4.41        NuStep   Level  5  5      SPM  70  80      Minutes  15  15      METs  2.8  3.3        Home Exercise Plan   Plans to continue exercise at  Home (comment)  Home (comment)      Frequency  Add 2 additional days to program exercise sessions.   Add 2 additional days to program exercise sessions.      Initial Home Exercises Provided  03/03/17  03/03/17         Exercise Comments: Exercise Comments    Row Name 02/22/17 1519 03/07/17 1636 03/29/17 1114 04/25/17 1647 05/17/17 1654   Exercise Comments  pt was oriented to exercise equipment today. Pt responded to exercise prescription. Encouraged pt to take rest breaks to avoid extreme fatigue/exhaution. Will continue to monitor pt's progress.  Reviewed METs and goals. Pt is tolerating exercise fairly well; will continue to monitor pt's activity levels and exercise progression.   Reviewed METs and goals. Pt is tolerating exercise fairly well; will continue to monitor pt's activity levels and exercise progression.   Reviewed METs and goals. Pt is tolerating exercise fairly well; will continue to monitor pt's activity levels and exercise progression.   Reviewed METs  and goals. Pt is tolerating exercise fairly well; will continue to monitor pt's activity levels and exercise progression.       Exercise Goals and Review: Exercise Goals    Row Name 02/14/17 1348             Exercise Goals   Increase Physical Activity  Yes       Intervention  Provide advice, education, support and counseling about physical activity/exercise needs.;Develop an individualized exercise prescription for aerobic and resistive training based on initial evaluation findings, risk stratification, comorbidities and participant's personal goals.       Expected Outcomes  Achievement of increased cardiorespiratory fitness and enhanced flexibility, muscular endurance and strength shown through measurements of functional capacity and personal statement of participant.       Increase Strength and Stamina  Yes improve walking tolerance, be able to climb stairs without SOB and improve muscle mass       Intervention  Provide advice, education, support and counseling about physical activity/exercise needs.;Develop an individualized  exercise prescription for aerobic and resistive training based on initial evaluation findings, risk stratification, comorbidities and participant's personal goals.       Expected Outcomes  Achievement of increased cardiorespiratory fitness and enhanced flexibility, muscular endurance and strength shown through measurements of functional capacity and personal statement of participant.       Able to understand and use rate of perceived exertion (RPE) scale  Yes       Intervention  Provide education and explanation on how to use RPE scale       Expected Outcomes  Short Term: Able to use RPE daily in rehab to express subjective intensity level;Long Term:  Able to use RPE to guide intensity level when exercising independently       Knowledge and understanding of Target Heart Rate Range (THRR)  Yes       Intervention  Provide education and explanation of THRR including how the numbers were predicted and where they are located for reference       Expected Outcomes  Short Term: Able to state/look up THRR;Long Term: Able to use THRR to govern intensity when exercising independently;Short Term: Able to use daily as guideline for intensity in rehab       Able to check pulse independently  Yes       Intervention  Provide education and demonstration on how to check pulse in carotid and radial arteries.;Review the importance of being able to check your own pulse for safety during independent exercise       Expected Outcomes  Short Term: Able to explain why pulse checking is important during independent exercise;Long Term: Able to check pulse independently and accurately       Understanding of Exercise Prescription  Yes       Intervention  Provide education, explanation, and written materials on patient's individual exercise prescription       Expected Outcomes  Short Term: Able to explain program exercise prescription;Long Term: Able to explain home exercise prescription to exercise independently           Exercise Goals Re-Evaluation : Exercise Goals Re-Evaluation    Row Name 03/03/17 1419 03/29/17 1114 04/25/17 1647 05/17/17 1653       Exercise Goal Re-Evaluation   Exercise Goals Review  Increase Physical Activity;Able to understand and use rate of perceived exertion (RPE) scale;Knowledge and understanding of Target Heart Rate Range (THRR);Understanding of Exercise Prescription;Able to check pulse independently;Increase Strength and Stamina  Increase Physical Activity;Able  to understand and use rate of perceived exertion (RPE) scale;Knowledge and understanding of Target Heart Rate Range (THRR);Understanding of Exercise Prescription;Able to check pulse independently;Increase Strength and Stamina  Increase Physical Activity;Able to understand and use rate of perceived exertion (RPE) scale;Knowledge and understanding of Target Heart Rate Range (THRR);Understanding of Exercise Prescription;Able to check pulse independently;Increase Strength and Stamina  Increase Physical Activity;Able to understand and use rate of perceived exertion (RPE) scale;Knowledge and understanding of Target Heart Rate Range (THRR);Understanding of Exercise Prescription;Able to check pulse independently;Increase Strength and Stamina    Comments  Reviewed home exercise with pt today.  Pt plans to walk  for exercise.  Reviewed THR, pulse, RPE, sign and symptoms, and when to call 911 or MD.  Also discussed weather considerations and indoor options.  Pt voiced understanding.  pt is compliant with HEP and is walking at home for 30 minutes, 2-3x/week without difficulty.   pt is active at home with chores and HEP. Pt has increase walking tolerance in cardiac rehab. Pt current speed on treadmill is 2.62mph/3%incline  Pt is making plans for discharge in which he will continue his exercise program out in the community at a local [facility] facility. Pt has increase exercise tolerance/capacity and has notice a significant difference in breathing  capacity and stamina.    Expected Outcomes  Pt will be compliant with HEP and improve in cardiorespiratory fitness.  Pt will be compliant with HEP and improve in cardiorespiratory fitness.  Pt will be compliant with HEP and improve in cardiorespiratory fitness.  Pt will be compliant with HEP and improve in cardiorespiratory fitness.        Discharge Exercise Prescription (Final Exercise Prescription Changes): Exercise Prescription Changes - 05/17/17 1600      Response to Exercise   Blood Pressure (Admit)  128/68    Blood Pressure (Exercise)  128/80    Blood Pressure (Exit)  120/70    Heart Rate (Admit)  98 bpm    Heart Rate (Exercise)  89 bpm    Heart Rate (Exit)  71 bpm    Rating of Perceived Exertion (Exercise)  12    Symptoms  None    Duration  Progress to 30 minutes of  aerobic without signs/symptoms of physical distress    Intensity  THRR unchanged      Progression   Progression  Continue to progress workloads to maintain intensity without signs/symptoms of physical distress.    Average METs  3.9      Resistance Training   Training Prescription  No relaxation day      Bike   Level  1.2    Minutes  15    METs  4.41      NuStep   Level  5    SPM  80    Minutes  15    METs  3.3      Home Exercise Plan   Plans to continue exercise at  Home (comment)    Frequency  Add 2 additional days to program exercise sessions.    Initial Home Exercises Provided  03/03/17       Nutrition:  Target Goals: Understanding of nutrition guidelines, daily intake of sodium 1500mg , cholesterol 200mg , calories 30% from fat and 7% or less from saturated fats, daily to have 5 or more servings of fruits and vegetables.  Biometrics: Pre Biometrics - 03/07/17 1410      Pre Biometrics   Height  5' 8.5" (1.74 m)    Weight  138  lb 3.7 oz (62.7 kg)    Waist Circumference  30.5 inches    Hip Circumference  35.5 inches    Waist to Hip Ratio  0.86 %    BMI (Calculated)  20.71    Triceps  Skinfold  222 mm    % Body Fat  22.1 %    Grip Strength  37 kg    Flexibility  8.25 in    Single Leg Stand  5.75 seconds      Post Biometrics - 05/04/17 0703       Post  Biometrics   Height  5' 8.5" (1.74 m)    Weight  145 lb 1 oz (65.8 kg)    Waist Circumference  30.25 inches    Hip Circumference  35.5 inches    Waist to Hip Ratio  0.85 %    BMI (Calculated)  21.73    Triceps Skinfold  14 mm    % Body Fat  21.7 %    Grip Strength  40 kg    Flexibility  9 in    Single Leg Stand  13.65 seconds       Nutrition Therapy Plan and Nutrition Goals: Nutrition Therapy & Goals - 02/14/17 1526      Nutrition Therapy   Diet  Carb Modified, Therapeutic Lifestyle Changes      Personal Nutrition Goals   Nutrition Goal  Improved glycemic control as evidenced by pt's A1c trending toward 7 or less.     Personal Goal #2  Pt to identify food quantities necessary to achieve weight gain of 6-10 lb at graduation from cardiac rehab. Goal wt of 145 lb desired.       Intervention Plan   Intervention  Prescribe, educate and counsel regarding individualized specific dietary modifications aiming towards targeted core components such as weight, hypertension, lipid management, diabetes, heart failure and other comorbidities.    Expected Outcomes  Short Term Goal: Understand basic principles of dietary content, such as calories, fat, sodium, cholesterol and nutrients.;Long Term Goal: Adherence to prescribed nutrition plan.       Nutrition Discharge: Nutrition Scores: Nutrition Assessments - 02/14/17 1515      MEDFICTS Scores   Pre Score  40       Nutrition Goals Re-Evaluation:   Nutrition Goals Re-Evaluation:   Nutrition Goals Discharge (Final Nutrition Goals Re-Evaluation):   Psychosocial: Target Goals: Acknowledge presence or absence of significant depression and/or stress, maximize coping skills, provide positive support system. Participant is able to verbalize types and ability to use  techniques and skills needed for reducing stress and depression.  Initial Review & Psychosocial Screening: Initial Psych Review & Screening - 02/14/17 1536      Initial Review   Current issues with  None Identified      Family Dynamics   Good Support System?  Yes family friends     Comments  upon brief assessment, no psychosocial needs identified, no interventions necessary       Barriers   Psychosocial barriers to participate in program  There are no identifiable barriers or psychosocial needs.      Screening Interventions   Interventions  Encouraged to exercise       Quality of Life Scores: Quality of Life - 05/24/17 1644      Quality of Life Scores   Health/Function Pre  20.53 %    Health/Function Post  19.73 %    Health/Function % Change  -3.9 %    Socioeconomic Pre  23.69 %  Socioeconomic Post  18 %    Socioeconomic % Change   -24.02 %    Psych/Spiritual Pre  23.43 %    Psych/Spiritual Post  16.9 %    Psych/Spiritual % Change  -27.87 %    Family Pre  22.3 %    Family Post  20.83 %    Family % Change  -6.59 %    GLOBAL Pre  22.09 % despite lowered scores, pt verbalizes no concerns. pt states symptoms are relieved and he feels more upbeat and positive.     GLOBAL Post  18.87 %    GLOBAL % Change  -14.58 %       PHQ-9: Recent Review Flowsheet Data    Depression screen [facility] 2/9 02/22/2017   Decreased Interest 0   Down, Depressed, Hopeless 0   PHQ - 2 Score 0     Interpretation of Total Score  Total Score Depression Severity:  1-4 = Minimal depression, 5-9 = Mild depression, 10-14 = Moderate depression, 15-19 = Moderately severe depression, 20-27 = Severe depression   Psychosocial Evaluation and Intervention: Psychosocial Evaluation - 02/22/17 1633      Psychosocial Evaluation & Interventions   Interventions  Encouraged to exercise with the program and follow exercise prescription    Comments  no psychosocial needs identified, no interventions necessary      Expected Outcomes  pt will exhibit positive outlook with good coping skills.     Continue Psychosocial Services   No Follow up required       Psychosocial Re-Evaluation: Psychosocial Re-Evaluation    [place] Name 03/07/17 0715 03/31/17 1153 04/25/17 1651 05/26/17 1452       Psychosocial Re-Evaluation   Current issues with  None Identified  None Identified  None Identified  None Identified    Comments  no psychosocial needs identified, no interventions necessary   no psychosocial needs identified, no interventions necessary   no psychosocial needs identified, no interventions necessary   no psychosocial needs identified, no interventions necessary     Expected Outcomes  pt will exhibit positive outlook with good coping skills.   pt will exhibit positive outlook with good coping skills.   pt will exhibit positive outlook with good coping skills.   pt will exhibit positive outlook with good coping skills.     Interventions  Encouraged to attend Cardiac Rehabilitation for the exercise  Encouraged to attend Cardiac Rehabilitation for the exercise  Encouraged to attend Cardiac Rehabilitation for the exercise  Encouraged to attend Cardiac Rehabilitation for the exercise    Continue Psychosocial Services   No Follow up required  No Follow up required  No Follow up required  No Follow up required       Psychosocial Discharge (Final Psychosocial Re-Evaluation): Psychosocial Re-Evaluation - 05/26/17 1452      Psychosocial Re-Evaluation   Current issues with  None Identified    Comments  no psychosocial needs identified, no interventions necessary     Expected Outcomes  pt will exhibit positive outlook with good coping skills.     Interventions  Encouraged to attend Cardiac Rehabilitation for the exercise    Continue Psychosocial Services   No Follow up required       Vocational Rehabilitation: Provide vocational rehab assistance to qualifying candidates.   Vocational Rehab Evaluation &  Intervention: Vocational Rehab - 02/14/17 1536      Initial Vocational Rehab Evaluation & Intervention   Assessment shows need for Vocational Rehabilitation  Yes  Education: Education Goals: Education classes will be provided on a weekly basis, covering required topics. Participant will state understanding/return demonstration of topics presented.  Learning Barriers/Preferences: Learning Barriers/Preferences - 02/14/17 1346      Learning Barriers/Preferences   Learning Barriers  Sight    Learning Preferences  Written Material       Education Topics: Count Your Pulse:  -Group instruction provided by verbal instruction, demonstration, patient participation and written materials to support subject.  Instructors address importance of being able to find your pulse and how to count your pulse when at home without a heart monitor.  Patients get hands on experience counting their pulse with staff help and individually.   CARDIAC REHAB PHASE II EXERCISE from 03/31/2017 in [place]  Date  03/31/17  Instruction Review Code  2- meets goals/outcomes      Heart Attack, Angina, and Risk Factor Modification:  -Group instruction provided by verbal instruction, video, and written materials to support subject.  Instructors address signs and symptoms of angina and heart attacks.    Also discuss risk factors for heart disease and how to make changes to improve heart health risk factors.   Functional Fitness:  -Group instruction provided by verbal instruction, demonstration, patient participation, and written materials to support subject.  Instructors address safety measures for doing things around the house.  Discuss how to get up and down off the floor, how to pick things up properly, how to safely get out of a chair without assistance, and balance training.   Meditation and Mindfulness:  -Group instruction provided by verbal instruction, patient participation,  and written materials to support subject.  Instructor addresses importance of mindfulness and meditation practice to help reduce stress and improve awareness.  Instructor also leads participants through a meditation exercise.    Stretching for Flexibility and Mobility:  -Group instruction provided by verbal instruction, patient participation, and written materials to support subject.  Instructors lead participants through series of stretches that are designed to increase flexibility thus improving mobility.  These stretches are additional exercise for major muscle groups that are typically performed during regular warm up and cool down.   CARDIAC REHAB PHASE II EXERCISE from 03/31/2017 in [place]  Date  03/22/17  Instruction Review Code  2- meets goals/outcomes      Hands Only CPR:  -Group verbal, video, and participation provides a basic overview of AHA guidelines for community CPR. Role-play of emergencies allow participants the opportunity to practice calling for help and chest compression technique with discussion of AED use.   Hypertension: -Group verbal and written instruction that provides a basic overview of hypertension including the most recent diagnostic guidelines, risk factor reduction with self-care instructions and medication management.    Nutrition I class: Heart Healthy Eating:  -Group instruction provided by PowerPoint slides, verbal discussion, and written materials to support subject matter. The instructor gives an explanation and review of the Therapeutic Lifestyle Changes diet recommendations, which includes a discussion on lipid goals, dietary fat, sodium, fiber, plant stanol/sterol esters, sugar, and the components of a well-balanced, healthy diet.   Nutrition II class: Lifestyle Skills:  -Group instruction provided by PowerPoint slides, verbal discussion, and written materials to support subject matter. The instructor gives an  explanation and review of label reading, grocery shopping for heart health, heart healthy recipe modifications, and ways to make healthier choices when eating out.   Diabetes Question & Answer:  -Group instruction provided by PowerPoint  slides, verbal discussion, and written materials to support subject matter. The instructor gives an explanation and review of diabetes co-morbidities, pre- and post-prandial blood glucose goals, pre-exercise blood glucose goals, signs, symptoms, and treatment of hypoglycemia and hyperglycemia, and foot care basics.   CARDIAC REHAB PHASE II EXERCISE from 03/31/2017 in [place]  Date  03/10/17  Educator  RD  Instruction Review Code  2- meets goals/outcomes      Diabetes Blitz:  -Group instruction provided by PowerPoint slides, verbal discussion, and written materials to support subject matter. The instructor gives an explanation and review of the physiology behind type 1 and type 2 diabetes, diabetes medications and rational behind using different medications, pre- and post-prandial blood glucose recommendations and Hemoglobin A1c goals, diabetes diet, and exercise including blood glucose guidelines for exercising safely.    Portion Distortion:  -Group instruction provided by PowerPoint slides, verbal discussion, written materials, and food models to support subject matter. The instructor gives an explanation of serving size versus portion size, changes in portions sizes over the last 20 years, and what consists of a serving from each food group.   CARDIAC REHAB PHASE II EXERCISE from 03/31/2017 in [place]  Date  03/15/17  Educator  RD  Instruction Review Code  2- meets goals/outcomes      Stress Management:  -Group instruction provided by verbal instruction, video, and written materials to support subject matter.  Instructors review role of stress in heart disease and how to cope with stress  positively.     Exercising on Your Own:  -Group instruction provided by verbal instruction, power point, and written materials to support subject.  Instructors discuss benefits of exercise, components of exercise, frequency and intensity of exercise, and end points for exercise.  Also discuss use of nitroglycerin and activating EMS.  Review options of places to exercise outside of rehab.  Review guidelines for sex with heart disease.   Cardiac Drugs I:  -Group instruction provided by verbal instruction and written materials to support subject.  Instructor reviews cardiac drug classes: antiplatelets, anticoagulants, beta blockers, and statins.  Instructor discusses reasons, side effects, and lifestyle considerations for each drug class.   Cardiac Drugs II:  -Group instruction provided by verbal instruction and written materials to support subject.  Instructor reviews cardiac drug classes: angiotensin converting enzyme inhibitors (ACE-I), angiotensin II receptor blockers (ARBs), nitrates, and calcium channel blockers.  Instructor discusses reasons, side effects, and lifestyle considerations for each drug class.   CARDIAC REHAB PHASE II EXERCISE from 03/31/2017 in [place]  Date  03/08/17  Instruction Review Code  2- meets goals/outcomes      Anatomy and Physiology of the Circulatory System:  Group verbal and written instruction and models provide basic cardiac anatomy and physiology, with the coronary electrical and arterial systems. Review of: AMI, Angina, Valve disease, Heart Failure, Peripheral Artery Disease, Cardiac Arrhythmia, Pacemakers, and the ICD.   CARDIAC REHAB PHASE II EXERCISE from 03/31/2017 in [place]  Date  02/22/17  Instruction Review Code  2- meets goals/outcomes      Other Education:  -Group or individual verbal, written, or video instructions that support the educational goals of the cardiac rehab  program.   Knowledge Questionnaire Score: Knowledge Questionnaire Score - 02/14/17 1535      Knowledge Questionnaire Score   Pre Score  18/24       Core Components/Risk Factors/Patient Goals at  Admission: Personal Goals and Risk Factors at Admission - 02/14/17 1615      Core Components/Risk Factors/Patient Goals on Admission   Diabetes  Yes    Intervention  Provide education about signs/symptoms and action to take for hypo/hyperglycemia.;Provide education about proper nutrition, including hydration, and aerobic/resistive exercise prescription along with prescribed medications to achieve blood glucose in normal ranges: Fasting glucose 65-99 mg/dL    Expected Outcomes  Short Term: Participant verbalizes understanding of the signs/symptoms and immediate care of hyper/hypoglycemia, proper foot care and importance of medication, aerobic/resistive exercise and nutrition plan for blood glucose control.;Long Term: Attainment of HbA1C < 7%.    Hypertension  Yes    Intervention  Provide education on lifestyle modifcations including regular physical activity/exercise, weight management, moderate sodium restriction and increased consumption of fresh fruit, vegetables, and low fat dairy, alcohol moderation, and smoking cessation.;Monitor prescription use compliance.    Expected Outcomes  Short Term: Continued assessment and intervention until BP is < 140/25mm HG in hypertensive participants. < 130/73mm HG in hypertensive participants with diabetes, heart failure or chronic kidney disease.;Long Term: Maintenance of blood pressure at goal levels.    Lipids  Yes    Intervention  Provide education and support for participant on nutrition & aerobic/resistive exercise along with prescribed medications to achieve LDL 70mg , HDL >40mg .    Expected Outcomes  Short Term: Participant states understanding of desired cholesterol values and is compliant with medications prescribed. Participant is following exercise  prescription and nutrition guidelines.;Long Term: Cholesterol controlled with medications as prescribed, with individualized exercise RX and with personalized nutrition plan. Value goals: LDL < 70mg , HDL > 40 mg.       Core Components/Risk Factors/Patient Goals Review:  Goals and Risk Factor Review    Row Name 03/07/17 1610 03/07/17 1410 03/31/17 1153 04/25/17 1650 05/26/17 1452     Core Components/Risk Factors/Patient Goals Review   Personal Goals Review  Diabetes;Hypertension;Lipids  -  Diabetes;Hypertension;Lipids  Diabetes;Hypertension;Lipids;Tobacco Cessation  Diabetes;Hypertension;Lipids;Tobacco Cessation   Review  pt with multiple CAD RF demonstrates willingness to participate in CR activities.  Blood sugar and blood pressure WNL.  -  pt with multiple CAD RF demonstrates willingness to participate in CR activities.  Blood sugar and blood pressure  WNL.  pt notes increase stamina  pt with multiple CAD RF demonstrates willingness to participate in CR activities.  Blood sugar and blood pressure  WNL.  pt notes increase stamina.  pt voices balance concerns at home. reviewed with Dr. Radford Pax.   pt with multiple CAD RF demonstrates willingness to participate in CR activities.  Blood sugar and blood pressure  WNL.  pt notes increase stamina.  pt voices balance concerns at home. reviewed with Dr. Radford Pax.    Expected Outcomes  pt will participate in CR exercise, nutrition and lifestyle modification to reduce overall RF.   -  pt will participate in CR exercise, nutrition and lifestyle modification to reduce overall RF.   pt will participate in CR exercise, nutrition and lifestyle modification to reduce overall RF.   pt will participate in CR exercise, nutrition and lifestyle modification to reduce overall RF.       Core Components/Risk Factors/Patient Goals at Discharge (Final Review):  Goals and Risk Factor Review - 05/26/17 1452      Core Components/Risk Factors/Patient Goals Review   Personal Goals  Review  Diabetes;Hypertension;Lipids;Tobacco Cessation    Review  pt with multiple CAD RF demonstrates willingness to participate in CR activities.  Blood sugar and blood  pressure  WNL.  pt notes increase stamina.  pt voices balance concerns at home. reviewed with Dr. Radford Pax.     Expected Outcomes  pt will participate in CR exercise, nutrition and lifestyle modification to reduce overall RF.        ITP Comments: ITP Comments    Row Name 02/14/17 1345 03/07/17 0713 03/31/17 1153 04/25/17 1650 05/26/17 1451   ITP Comments  Medical Director, Dr. Fransico Him  30 day ITP review.  pt with good participation and attendance.   30 day ITP review.  pt with good participation and attendance.   30 day ITP review.  pt with good participation and attendance.   30 day ITP review.  pt with good participation and attendance.       Comments:

## 2017-05-29 ENCOUNTER — Encounter (HOSPITAL_COMMUNITY): Payer: Self-pay

## 2017-05-29 ENCOUNTER — Encounter (HOSPITAL_COMMUNITY)
Admission: RE | Admit: 2017-05-29 | Discharge: 2017-05-29 | Disposition: A | Discharge status: Home / Self Care | Payer: Medicare Other | Source: Ambulatory Visit | Attending: Cardiology | Admitting: Cardiology

## 2017-05-29 DIAGNOSIS — I214 Non-ST elevation (NSTEMI) myocardial infarction: Secondary | ICD-10-CM | POA: Diagnosis not present

## 2017-05-29 DIAGNOSIS — Z951 Presence of aortocoronary bypass graft: Secondary | ICD-10-CM

## 2017-06-20 NOTE — Progress Notes (Signed)
Discharge Progress Report  Patient Details  Name: Shawn Collins MRN: [id number] Date of Birth: July 13, 1950 Referring Provider:     [facility] from 02/14/2017 in [place]  Referring Provider  Fransico Him MD       Number of Visits: 90  Reason for Discharge:  Patient reached a stable level of exercise.  Smoking History:  Social History   Tobacco Use  Smoking Status Former Smoker  . Packs/day: 0.25  . Years: 8.00  . Pack years: 2.00  . Types: Cigarettes  . Last attempt to quit: 1972  . Years since quitting: 47.0  Smokeless Tobacco Never Used    Diagnosis:  NSTEMI (non-ST elevated myocardial infarction) ([place])  S/P CABG x 4  ADL [facility]SD:   Initial Exercise Prescription: Initial Exercise Prescription - 02/14/17 1600      Date of Initial Exercise RX and Referring Provider   Date  02/14/17    Referring Provider  Fransico Him MD      Treadmill   MPH  2.5    Grade  0    Minutes  15    METs  2.91      Bike   Level  0.5    Minutes  15    METs  2.45      NuStep   Level  3    SPM  80    Minutes  15    METs  2      Prescription Details   Frequency (times per week)  3    Duration  Progress to 30 minutes of continuous aerobic without signs/symptoms of physical distress      Intensity   THRR 40-80% of Max Heartrate  61-122    Ratings of Perceived Exertion  11-13    Perceived Dyspnea  0-4      Progression   Progression  Continue to progress workloads to maintain intensity without signs/symptoms of physical distress.      Resistance Training   Training Prescription  Yes    Weight  2lbs    Reps  10-15       Discharge Exercise Prescription (Final Exercise Prescription Changes): Exercise Prescription Changes - 06/07/17 1200      Response to Exercise   Blood Pressure (Admit)  130/70    Blood Pressure (Exercise)  130/70    Blood Pressure (Exit)  130/80    Heart Rate (Admit)  63 bpm    Heart Rate  (Exercise)  83 bpm    Heart Rate (Exit)  69 bpm    Rating of Perceived Exertion (Exercise)  11    Symptoms  None    Duration  Progress to 30 minutes of  aerobic without signs/symptoms of physical distress    Intensity  THRR unchanged      Progression   Progression  Continue to progress workloads to maintain intensity without signs/symptoms of physical distress.    Average METs  3.9      Resistance Training   Training Prescription  Yes    Weight  3lbs    Reps  10-15    Time  10 Minutes      Treadmill   MPH  3    Grade  3    Minutes  15    METs  4.54      NuStep   Level  5    SPM  80    Minutes  15    METs  3.2  Home Exercise Plan   Plans to continue exercise at  Home (comment)    Frequency  Add 2 additional days to program exercise sessions.    Initial Home Exercises Provided  03/03/17       Functional Capacity: 6 Minute Walk    Row Name 02/14/17 1608 05/04/17 0704       6 Minute Walk   Phase  Initial  Discharge    Distance  1600 feet  1964 feet    Distance % Change  -  18.53 %    Distance Feet Change  -  94 ft    Walk Time  6 minutes  6 minutes    # of Rest Breaks  0  0    MPH  3.03  3.72    METS  3.7  4.44    RPE  13  11    Perceived Dyspnea   -  0    VO2 Peak  -  15.5    Symptoms  No  No    Resting HR  65 bpm  63 bpm    Resting BP  100/64  102/64    Resting Oxygen Saturation   100 %  -    Exercise Oxygen Saturation  during 6 min walk  100 %  -    Max Ex. HR  83 bpm  95 bpm    Max Ex. BP  116/60  124/60    2 Minute Post BP  114/70  116/70       Psychological, QOL, Others - Outcomes: PHQ 2/9: Depression screen PHQ 2/9 02/22/2017  Decreased Interest 0  Down, Depressed, Hopeless 0  PHQ - 2 Score 0    Quality of Life: Quality of Life - 05/24/17 1644      Quality of Life Scores   Health/Function Pre  20.53 %    Health/Function Post  19.73 %    Health/Function % Change  -3.9 %    Socioeconomic Pre  23.69 %    Socioeconomic Post  18 %     Socioeconomic % Change   -24.02 %    Psych/Spiritual Pre  23.43 %    Psych/Spiritual Post  16.9 %    Psych/Spiritual % Change  -27.87 %    Family Pre  22.3 %    Family Post  20.83 %    Family % Change  -6.59 %    GLOBAL Pre  22.09 % despite lowered scores, pt verbalizes no concerns. pt states symptoms are relieved and he feels more upbeat and positive.     GLOBAL Post  18.87 %    GLOBAL % Change  -14.58 %       Personal Goals: Goals established at orientation with interventions provided to work toward goal. Personal Goals and Risk Factors at Admission - 02/14/17 1615      Core Components/Risk Factors/Patient Goals on Admission   Diabetes  Yes    Intervention  Provide education about signs/symptoms and action to take for hypo/hyperglycemia.;Provide education about proper nutrition, including hydration, and aerobic/resistive exercise prescription along with prescribed medications to achieve blood glucose in normal ranges: Fasting glucose 65-99 mg/dL    Expected Outcomes  Short Term: Participant verbalizes understanding of the signs/symptoms and immediate care of hyper/hypoglycemia, proper foot care and importance of medication, aerobic/resistive exercise and nutrition plan for blood glucose control.;Long Term: Attainment of HbA1C < 7%.    Hypertension  Yes    Intervention  Provide education on lifestyle modifcations including  regular physical activity/exercise, weight management, moderate sodium restriction and increased consumption of fresh fruit, vegetables, and low fat dairy, alcohol moderation, and smoking cessation.;Monitor prescription use compliance.    Expected Outcomes  Short Term: Continued assessment and intervention until BP is < 140/88mm HG in hypertensive participants. < 130/50mm HG in hypertensive participants with diabetes, heart failure or chronic kidney disease.;Long Term: Maintenance of blood pressure at goal levels.    Lipids  Yes    Intervention  Provide education and  support for participant on nutrition & aerobic/resistive exercise along with prescribed medications to achieve LDL 70mg , HDL >40mg .    Expected Outcomes  Short Term: Participant states understanding of desired cholesterol values and is compliant with medications prescribed. Participant is following exercise prescription and nutrition guidelines.;Long Term: Cholesterol controlled with medications as prescribed, with individualized exercise RX and with personalized nutrition plan. Value goals: LDL < 70mg , HDL > 40 mg.        Personal Goals Discharge: Goals and Risk Factor Review    Row Name 03/07/17 0713 03/07/17 1410 03/31/17 1153 04/25/17 1650 05/26/17 1452     Core Components/Risk Factors/Patient Goals Review   Personal Goals Review  Diabetes;Hypertension;Lipids  -  Diabetes;Hypertension;Lipids  Diabetes;Hypertension;Lipids;Tobacco Cessation  Diabetes;Hypertension;Lipids;Tobacco Cessation   Review  pt with multiple CAD RF demonstrates willingness to participate in CR activities.  Blood sugar and blood pressure WNL.  -  pt with multiple CAD RF demonstrates willingness to participate in CR activities.  Blood sugar and blood pressure  WNL.  pt notes increase stamina  pt with multiple CAD RF demonstrates willingness to participate in CR activities.  Blood sugar and blood pressure  WNL.  pt notes increase stamina.  pt voices balance concerns at home. reviewed with Dr. Radford Pax.   pt with multiple CAD RF demonstrates willingness to participate in CR activities.  Blood sugar and blood pressure  WNL.  pt notes increase stamina.  pt voices balance concerns at home. reviewed with Dr. Radford Pax.    Expected Outcomes  pt will participate in CR exercise, nutrition and lifestyle modification to reduce overall RF.   -  pt will participate in CR exercise, nutrition and lifestyle modification to reduce overall RF.   pt will participate in CR exercise, nutrition and lifestyle modification to reduce overall RF.   pt will  participate in CR exercise, nutrition and lifestyle modification to reduce overall RF.    [place] Name 05/29/17 1525             Core Components/Risk Factors/Patient Goals Review   Review  pt completed CR program with 33sessions.pt plans to continue exercising on his own by [employer] and going to gym. pt pleased with his increased strength/stamina and improvement in his ADLs.         Expected Outcomes  pt will participate in exercise, nutrition and lifestyle modification to reduce overall RF.           Exercise Goals and Review: Exercise Goals    Row Name 02/14/17 1348             Exercise Goals   Increase Physical Activity  Yes       Intervention  Provide advice, education, support and counseling about physical activity/exercise needs.;Develop an individualized exercise prescription for aerobic and resistive training based on initial evaluation findings, risk stratification, comorbidities and participant's personal goals.       Expected Outcomes  Achievement of increased cardiorespiratory fitness and enhanced flexibility, muscular endurance and strength shown through measurements  of functional capacity and personal statement of participant.       Increase Strength and Stamina  Yes improve walking tolerance, be able to climb stairs without SOB and improve muscle mass       Intervention  Provide advice, education, support and counseling about physical activity/exercise needs.;Develop an individualized exercise prescription for aerobic and resistive training based on initial evaluation findings, risk stratification, comorbidities and participant's personal goals.       Expected Outcomes  Achievement of increased cardiorespiratory fitness and enhanced flexibility, muscular endurance and strength shown through measurements of functional capacity and personal statement of participant.       Able to understand and use rate of perceived exertion (RPE) scale  Yes       Intervention   Provide education and explanation on how to use RPE scale       Expected Outcomes  Short Term: Able to use RPE daily in rehab to express subjective intensity level;Long Term:  Able to use RPE to guide intensity level when exercising independently       Knowledge and understanding of Target Heart Rate Range (THRR)  Yes       Intervention  Provide education and explanation of THRR including how the numbers were predicted and where they are located for reference       Expected Outcomes  Short Term: Able to state/look up THRR;Long Term: Able to use THRR to govern intensity when exercising independently;Short Term: Able to use daily as guideline for intensity in rehab       Able to check pulse independently  Yes       Intervention  Provide education and demonstration on how to check pulse in carotid and radial arteries.;Review the importance of being able to check your own pulse for safety during independent exercise       Expected Outcomes  Short Term: Able to explain why pulse checking is important during independent exercise;Long Term: Able to check pulse independently and accurately       Understanding of Exercise Prescription  Yes       Intervention  Provide education, explanation, and written materials on patient's individual exercise prescription       Expected Outcomes  Short Term: Able to explain program exercise prescription;Long Term: Able to explain home exercise prescription to exercise independently          Nutrition & Weight - Outcomes: Pre Biometrics - 03/07/17 1410      Pre Biometrics   Height  5' 8.5" (1.74 m)    Weight  138 lb 3.7 oz (62.7 kg)    Waist Circumference  30.5 inches    Hip Circumference  35.5 inches    Waist to Hip Ratio  0.86 %    BMI (Calculated)  20.71    Triceps Skinfold  222 mm    % Body Fat  22.1 %    Grip Strength  37 kg    Flexibility  8.25 in    Single Leg Stand  5.75 seconds      Post Biometrics - 05/04/17 0703       Post  Biometrics   Height   5' 8.5" (1.74 m)    Weight  145 lb 1 oz (65.8 kg)    Waist Circumference  30.25 inches    Hip Circumference  35.5 inches    Waist to Hip Ratio  0.85 %    BMI (Calculated)  21.73    Triceps Skinfold  14 mm    %  Body Fat  21.7 %    Grip Strength  40 kg    Flexibility  9 in    Single Leg Stand  13.65 seconds       Nutrition: Nutrition Therapy & Goals - 02/14/17 1526      Nutrition Therapy   Diet  Carb Modified, Therapeutic Lifestyle Changes      Personal Nutrition Goals   Nutrition Goal  Improved glycemic control as evidenced by pt's A1c trending toward 7 or less.     Personal Goal #2  Pt to identify food quantities necessary to achieve weight gain of 6-10 lb at graduation from cardiac rehab. Goal wt of 145 lb desired.       Intervention Plan   Intervention  Prescribe, educate and counsel regarding individualized specific dietary modifications aiming towards targeted core components such as weight, hypertension, lipid management, diabetes, heart failure and other comorbidities.    Expected Outcomes  Short Term Goal: Understand basic principles of dietary content, such as calories, fat, sodium, cholesterol and nutrients.;Long Term Goal: Adherence to prescribed nutrition plan.       Nutrition Discharge: Nutrition Assessments - 06/09/17 1400      MEDFICTS Scores   Pre Score  40    Post Score  -- Pt returned survey incomplete       Education Questionnaire Score: Knowledge Questionnaire Score - 02/14/17 1535      Knowledge Questionnaire Score   Pre Score  18/24       Goals reviewed with patient; copy given to patient.

## 2017-11-22 ENCOUNTER — Ambulatory Visit (INDEPENDENT_AMBULATORY_CARE_PROVIDER_SITE_OTHER): Payer: Medicare Other | Admitting: Cardiology

## 2017-11-22 ENCOUNTER — Encounter: Payer: Self-pay | Admitting: Cardiology

## 2017-11-22 ENCOUNTER — Encounter (INDEPENDENT_AMBULATORY_CARE_PROVIDER_SITE_OTHER): Payer: Self-pay

## 2017-11-22 VITALS — BP 120/82 | HR 62 | Ht 68.5 in | Wt 147.8 lb

## 2017-11-22 DIAGNOSIS — R0609 Other forms of dyspnea: Secondary | ICD-10-CM | POA: Diagnosis not present

## 2017-11-22 DIAGNOSIS — I10 Essential (primary) hypertension: Secondary | ICD-10-CM | POA: Diagnosis not present

## 2017-11-22 DIAGNOSIS — I2510 Atherosclerotic heart disease of native coronary artery without angina pectoris: Secondary | ICD-10-CM | POA: Diagnosis not present

## 2017-11-22 DIAGNOSIS — E785 Hyperlipidemia, unspecified: Secondary | ICD-10-CM | POA: Diagnosis not present

## 2017-11-22 MED ORDER — ASPIRIN EC 81 MG PO TBEC: 81.0000 mg | DELAYED_RELEASE_TABLET | Freq: Every day | ORAL | 3 refills | Status: DC

## 2017-11-22 NOTE — Progress Notes (Addendum)
Cardiology Office Note:    Date:  11/22/2017   ID:  Shawn Collins, DOB 09/16/1950, MRN [id number]  PCP:  Shawn Ebbs, MD  Cardiologist:  No primary care provider on file.    Referring MD: Shawn Ebbs, MD   Chief Complaint  Patient presents with  . Coronary Artery Disease  . Hypertension  . Hyperlipidemia    History of Present Illness:    Shawn Collins is a 67 y.o. male with a hx of ASCAD s/p NSTEMI with cath showing severe 3VD s/p  CABG x 3 withLIMA to LAD,SVG to OM1/OM2 and SVG D1 by Dr. Servando Collins on 10/26/16. He also has CKD, DM, HTN and HLD.  He is here today for followup and is doing well.  He denies any chest pain or pressure,  PND, orthopnea, LE edema, dizziness, palpitations or syncope.  He has been having some DOE but has not been exercising much and not been very active.  He is compliant with his meds and is tolerating meds with no SE.    Past Medical History:  Diagnosis Date  . CAD (coronary artery disease), native coronary artery    s/p NSTEMI with cath showing severe 3VD s/p  CABG x 3 with LIMA to LAD, SVG to OM1/OM2 and SVG D1 by Dr. Servando Collins on 10/26/16.  . CKD (chronic kidney disease) stage 3, GFR 30-59 ml/min ([facility]) 09/2016  . COPD (chronic obstructive pulmonary disease) ([place]) 09/10/2014  . Coronary artery disease involving native coronary artery of native heart with unstable angina pectoris ([place])   . Diabetes mellitus without complication ([place])   . Dizzy-->?Orthostatic? 09/10/2014  . Emphysema lung ([place])   . Hyperlipidemia LDL goal <70 04/17/2017  . Hypertension   . Hypoxia 09/10/2014    Past Surgical History:  Procedure Laterality Date  . CORONARY ARTERY BYPASS GRAFT N/A 10/26/2016   Procedure: CORONARY ARTERY BYPASS GRAFTING (CABG) x 4 USING LEFT INTERNAL MAMMARY ARTERY TO LAD AND ENDOSCOPICALLY HARVESTED GREATER SAPHENOUS VEIN TO OM 1, 2 AND TO DIAG;  Surgeon: Shawn Isaac, MD;  Location: [facility];  Service: Open Heart Surgery;  Laterality: N/A;  .  ENDOVEIN HARVEST OF GREATER SAPHENOUS VEIN Right 10/26/2016   Procedure: ENDOVEIN HARVEST OF GREATER SAPHENOUS VEIN;  Surgeon: Shawn Isaac, MD;  Location: [facility];  Service: Open Heart Surgery;  Laterality: Right;  . IR THORACENTESIS ASP PLEURAL SPACE W/IMG GUIDE  11/03/2016  . LEFT HEART CATH AND CORONARY ANGIOGRAPHY N/A 10/21/2016   Procedure: Left Heart Cath and Coronary Angiography;  Surgeon: Martinique, Peter M, MD;  Location: [facility];  Service: Cardiovascular;  Laterality: N/A;  . TEE WITHOUT CARDIOVERSION N/A 10/26/2016   Procedure: TRANSESOPHAGEAL ECHOCARDIOGRAM (TEE);  Surgeon: Shawn Isaac, MD;  Location: [facility];  Service: Open Heart Surgery;  Laterality: N/A;    Current Medications: Current Meds  Medication Sig  . amLODipine (NORVASC) 10 MG tablet Take 1 tablet (10 mg total) by mouth daily.  Marland Kitchen aspirin 325 MG tablet Take 325 mg by mouth daily.  Marland Kitchen atorvastatin (LIPITOR) 10 MG tablet Take 10 mg by mouth daily.  . Cholecalciferol (VITAMIN D3) 5000 units CAPS Take 1 capsule by mouth daily.  . furosemide (LASIX) 40 MG tablet Take 40 mg by mouth daily.  . Insulin Glargine (TOUJEO SOLOSTAR) 300 UNIT/ML SOPN Inject 15 Units into the skin at bedtime.   . metoprolol tartrate (LOPRESSOR) 25 MG tablet Take 0.5 tablets (12.5 mg total) by mouth 2 (two) times daily.  . nitroGLYCERIN (NITROSTAT)  0.4 MG SL tablet Place 1 tablet (0.4 mg total) under the tongue every 5 (five) minutes as needed.     Allergies:   No known allergies   Social History   Socioeconomic History  . Marital status: Single    Spouse name: Not on file  . Number of children: Not on file  . Years of education: Not on file  . Highest education level: Not on file  Occupational History  . Not on file  Social Needs  . Financial resource strain: Not on file  . Food insecurity:    Worry: Not on file    Inability: Not on file  . Transportation needs:    Medical: Not on file    Non-medical: Not on file    Tobacco Use  . Smoking status: Former Smoker    Packs/day: 0.25    Years: 8.00    Pack years: 2.00    Types: Cigarettes    Last attempt to quit: 1972    Years since quitting: 47.5  . Smokeless tobacco: Never Used  Substance and Sexual Activity  . Alcohol use: No  . Drug use: No  . Sexual activity: Not on file  Lifestyle  . Physical activity:    Days per week: Not on file    Minutes per session: Not on file  . Stress: Not on file  Relationships  . Social connections:    Talks on phone: Not on file    Gets together: Not on file    Attends religious service: Not on file    Active member of club or organization: Not on file    Attends meetings of clubs or organizations: Not on file    Relationship status: Not on file  Other Topics Concern  . Not on file  Social History Narrative  . Not on file     Family History: The patient's family history includes Diabetes Mellitus II in his mother and sister; Hypertension in his father; Stroke in his father.  ROS:   Please see the history of present illness.    ROS  All other systems reviewed and negative.   EKGs/Labs/Other Studies Reviewed:    The following studies were reviewed today: none  EKG:  EKG is  ordered today and showed normal sinus rhythm at 62 bpm with nonspecific T wave abnormality.  Recent Labs: 04/26/2017: ALT 27   Recent Lipid Panel    Component Value Date/Time   CHOL 121 04/26/2017 1449   TRIG 91 04/26/2017 1449   HDL 40 04/26/2017 1449   CHOLHDL 3.0 04/26/2017 1449   CHOLHDL 3.3 10/16/2016 0512   VLDL 10 10/16/2016 0512   LDLCALC 63 04/26/2017 1449    Physical Exam:    VS:  BP 120/82   Pulse 62   Ht 5' 8.5" (1.74 Collins)   Wt 147 lb 12.8 oz (67 kg)   BMI 22.15 kg/Collins     Wt Readings from Last 3 Encounters:  11/22/17 147 lb 12.8 oz (67 kg)  05/04/17 145 lb 1 oz (65.8 kg)  04/17/17 148 lb 6.4 oz (67.3 kg)     GEN:  Well nourished, well developed in no acute distress HEENT: Normal NECK: No JVD;  No carotid bruits LYMPHATICS: No lymphadenopathy CARDIAC: RRR, no murmurs, rubs, gallops RESPIRATORY:  Clear to auscultation without rales, wheezing or rhonchi  ABDOMEN: Soft, non-tender, non-distended MUSCULOSKELETAL:  No edema; No deformity  SKIN: Warm and dry NEUROLOGIC:  Alert and oriented x 3 PSYCHIATRIC:  Normal affect  ASSESSMENT:    1. Coronary artery disease involving native coronary artery of native heart without angina pectoris   2. Essential hypertension   3. Hyperlipidemia LDL goal <70   4. DOE (dyspnea on exertion)    PLAN:    In order of problems listed above:  1.  ASCAD - s/p NSTEMI with cath showing severe 3VD s/p  CABG x 3 withLIMA to LAD,SVG to OM1/OM2 and SVG D1 by Dr. Servando Collins on 10/26/16.  He denies any anginal symptoms.  He will continue Lopressor 12.5 mg twice daily and statin.  I instructed him to decrease his aspirin to 81 mg daily.  2.  DOE - He does occasionally complain of some shortness of breath mainly when going upstairs and fatigue.  He did have a pericardial effusion on echo a year ago so we will repeat a 2D echocardiogram to make sure LV function is still stable and his pericardial effusion is not increased.  His fatigue is likely secondary to deconditioning.  I encouraged him to try to get into an exercise routine.  3.  Hypertension - BP is well controlled on exam today.  He will continue on amlodipine 10 mg daily and Lopressor 12.5 mg twice daily  4.  Hyperlipidemia - LDL goal < 70.  His LDL was 63 on 04/26/2017.  ALT was normal at 27.  He is now on Lipitor 10 mg daily instead of Crestor.  I will repeat an FLP and ALT fasting.  Medication Adjustments/Labs and Tests Ordered: Current medicines are reviewed at length with the patient today.  Concerns regarding medicines are outlined above.  No orders of the defined types were placed in this encounter.  No orders of the defined types were placed in this encounter.   Signed, Fransico Him, MD    11/22/2017 3:26 PM    [place]

## 2017-11-22 NOTE — Patient Instructions (Signed)
Medication Instructions:  Your physician has recommended you make the following change in your medication:  DECREASE: aspirin to 81 mg once a day    Labwork: Your physician recommends that you schedule a follow-up appointment in: 1 week for cholesterol check and liver function   Testing/Procedures: Your physician has requested that you have an echocardiogram. Echocardiography is a painless test that uses sound waves to create images of your heart. It provides your doctor with information about the size and shape of your heart and how well your heart's chambers and valves are working. This procedure takes approximately one hour. There are no restrictions for this procedure.   Follow-Up: Your physician wants you to follow-up in: 1 year with Dr. Radford Pax. You will receive a reminder letter in the mail two months in advance. If you don't receive a letter, please call our office to schedule the follow-up appointment.  Any Other Special Instructions Will Be Listed Below (If Applicable).    Thank you for choosing [facility], RN  [phone]  If you need a refill on your cardiac medications before your next appointment, please call your pharmacy.

## 2017-11-23 NOTE — Addendum Note (Signed)
Addended by: Patterson Hammersmith A on: 11/23/2017 01:24 PM   Modules accepted: Orders

## 2017-12-01 ENCOUNTER — Ambulatory Visit (HOSPITAL_COMMUNITY): Payer: Medicare Other | Attending: Cardiovascular Disease

## 2017-12-01 ENCOUNTER — Other Ambulatory Visit: Payer: Medicare Other | Admitting: *Deleted

## 2017-12-01 ENCOUNTER — Other Ambulatory Visit: Payer: Self-pay

## 2017-12-01 DIAGNOSIS — J439 Emphysema, unspecified: Secondary | ICD-10-CM | POA: Insufficient documentation

## 2017-12-01 DIAGNOSIS — I10 Essential (primary) hypertension: Secondary | ICD-10-CM | POA: Insufficient documentation

## 2017-12-01 DIAGNOSIS — E119 Type 2 diabetes mellitus without complications: Secondary | ICD-10-CM | POA: Diagnosis not present

## 2017-12-01 DIAGNOSIS — Z87891 Personal history of nicotine dependence: Secondary | ICD-10-CM | POA: Insufficient documentation

## 2017-12-01 DIAGNOSIS — R0609 Other forms of dyspnea: Secondary | ICD-10-CM | POA: Diagnosis present

## 2017-12-01 DIAGNOSIS — E785 Hyperlipidemia, unspecified: Secondary | ICD-10-CM

## 2017-12-01 DIAGNOSIS — I2510 Atherosclerotic heart disease of native coronary artery without angina pectoris: Secondary | ICD-10-CM | POA: Insufficient documentation

## 2017-12-01 DIAGNOSIS — J449 Chronic obstructive pulmonary disease, unspecified: Secondary | ICD-10-CM | POA: Diagnosis not present

## 2017-12-01 LAB — LIPID PANEL
CHOL/HDL RATIO: 3.1 ratio (ref 0.0–5.0)
Cholesterol, Total: 116 mg/dL (ref 100–199)
HDL: 38 mg/dL — AB (ref >39)
LDL Calculated: 65 mg/dL (ref 0–99)
Triglycerides: 66 mg/dL (ref 0–149)
VLDL Cholesterol Cal: 13 mg/dL (ref 5–40)

## 2017-12-01 LAB — HEPATIC FUNCTION PANEL
ALBUMIN: 3.8 g/dL (ref 3.6–4.8)
ALK PHOS: 54 IU/L (ref 39–117)
ALT: 24 IU/L (ref 0–44)
AST: 27 IU/L (ref 0–40)
BILIRUBIN, DIRECT: 0.19 mg/dL (ref 0.00–0.40)
Bilirubin Total: 0.6 mg/dL (ref 0.0–1.2)
Total Protein: 6.5 g/dL (ref 6.0–8.5)

## 2018-08-13 IMAGING — CT CT ABD-PELV W/O CM
2 of 4 series · 15 of 46 positions shown, 17 images · non-contrast
Comparison: Contrast-enhanced CT 06/27/2010

CLINICAL DATA: Nausea and vomiting.

EXAM:
CT ABDOMEN AND PELVIS WITHOUT CONTRAST
TECHNIQUE: Multidetector CT imaging of the abdomen and pelvis was performed
following the standard protocol without IV contrast.

[Series 3: abd/ pelvis 5.0 i30f 2 · axial · 0.65mm/px · z∈[+749,+1149]mm · 12 of 90 slices shown, 14 images]
[im 5/90  soft-tissue]
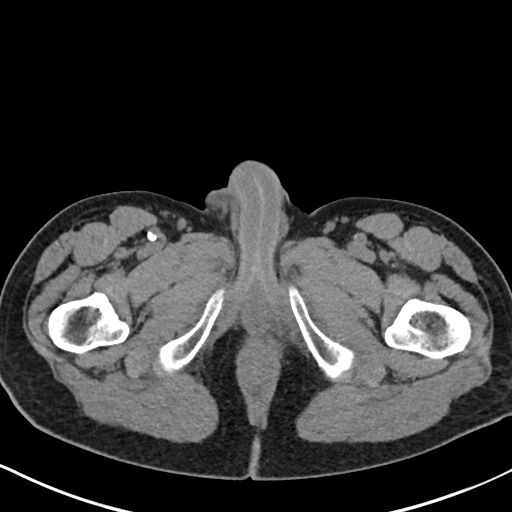
[im 5/90  bone]
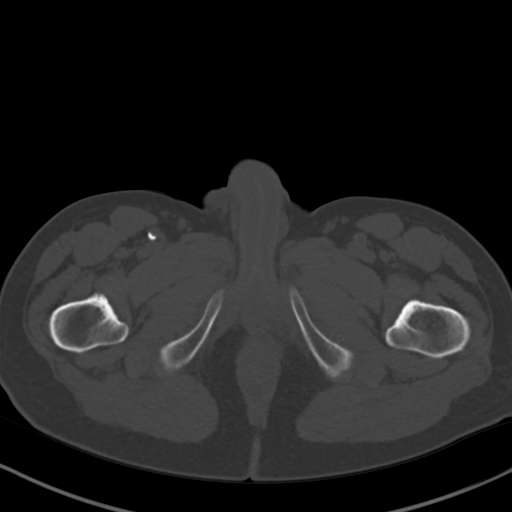
[im 15/90  soft-tissue]
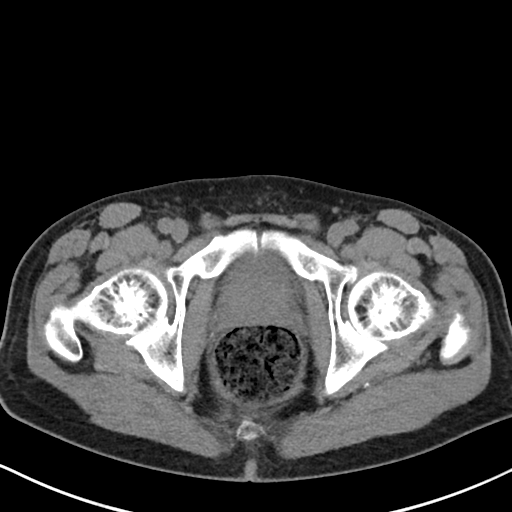
[im 20/90  soft-tissue]
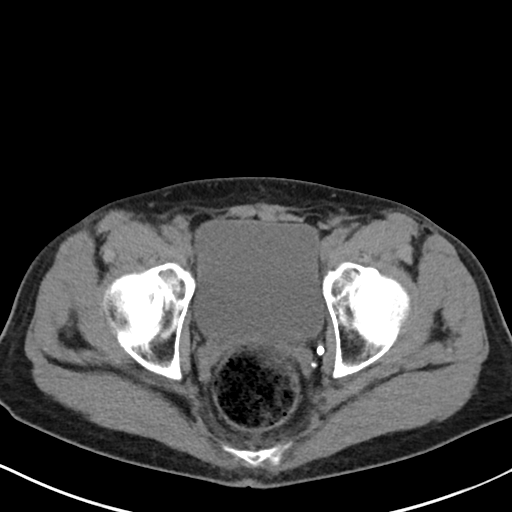
[im 25/90  soft-tissue]
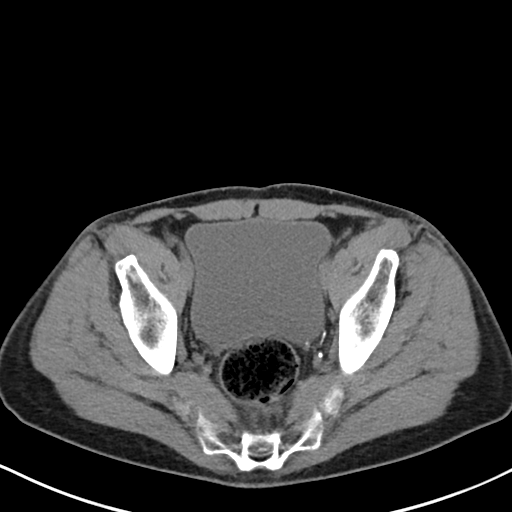
[im 35/90  soft-tissue]
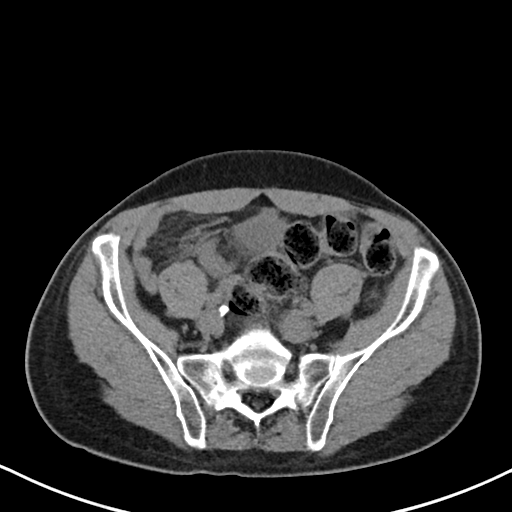
[im 40/90  soft-tissue]
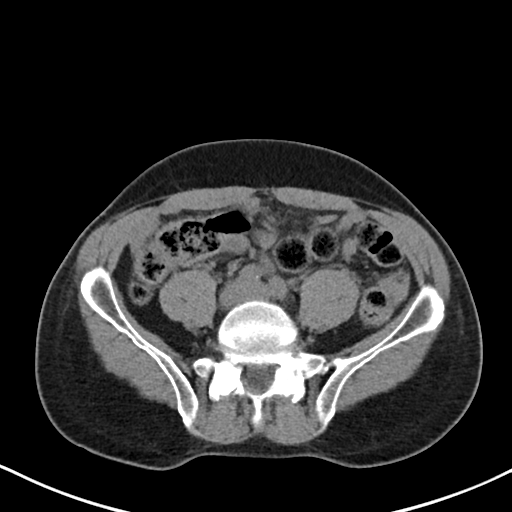
[im 50/90  soft-tissue]
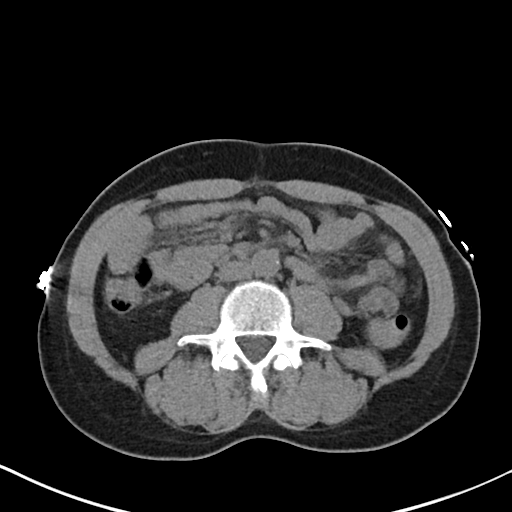
[im 55/90  soft-tissue]
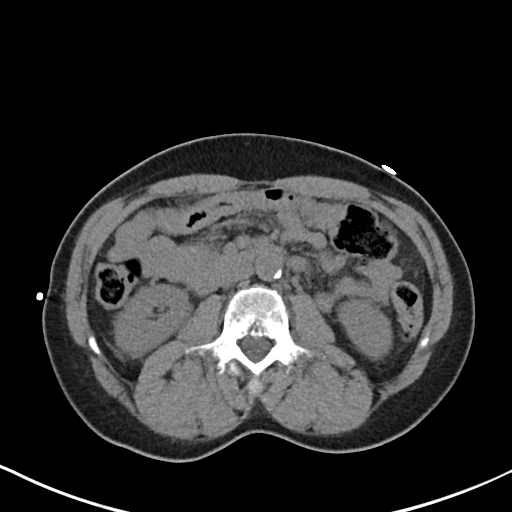
[im 65/90  soft-tissue]
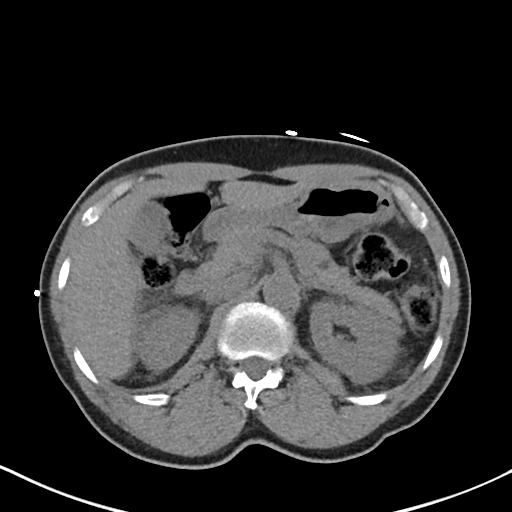
[im 65/90  bone]
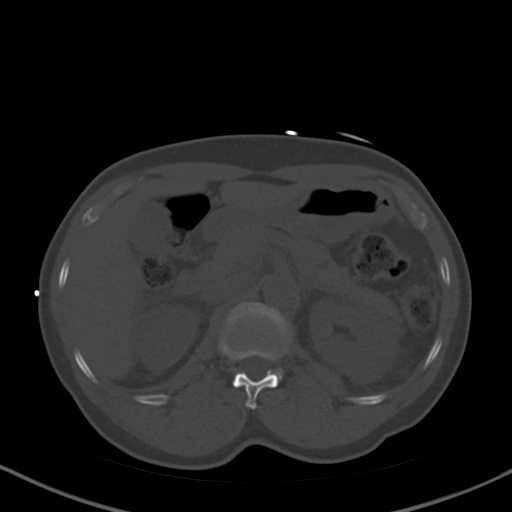
[im 70/90  soft-tissue]
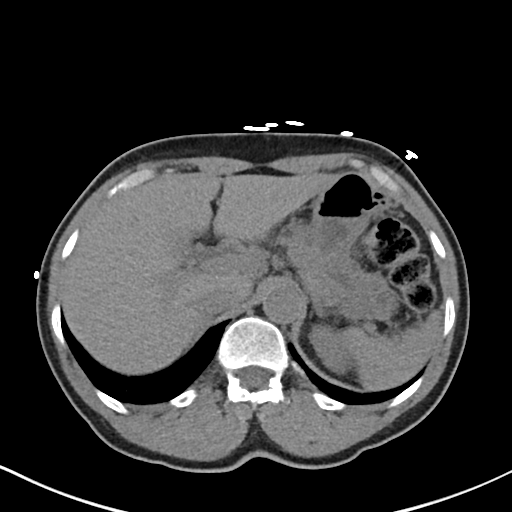
[im 75/90  soft-tissue]
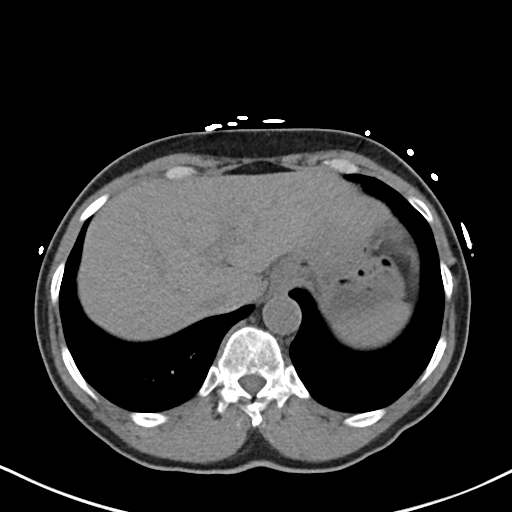
[im 85/90  soft-tissue]
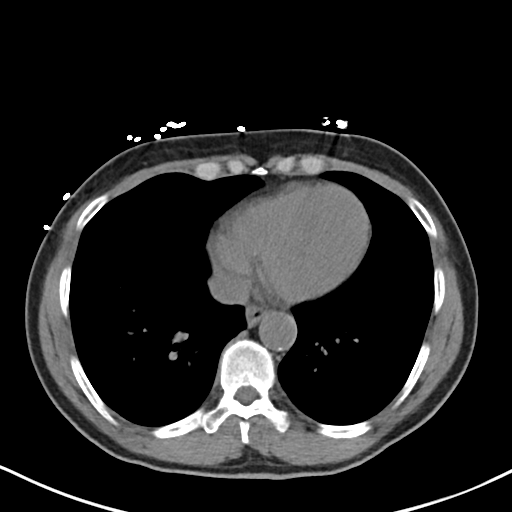

[Series 6: cor st · coronal · 0.67mm/px · 3 of 74 slices shown]
[im 25/74  soft-tissue]
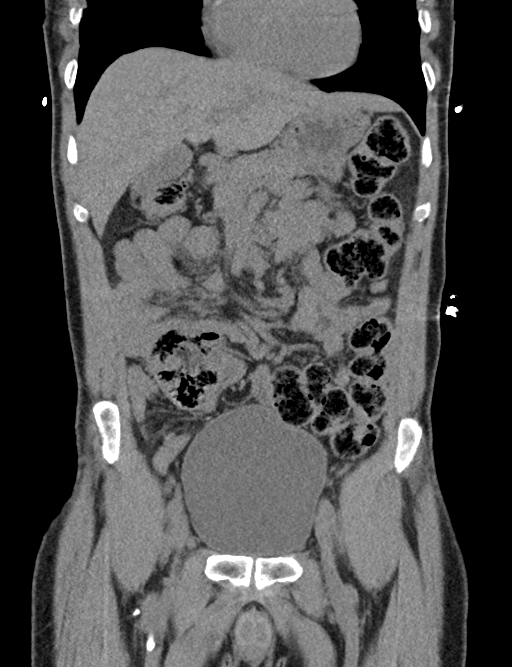
[im 33/74  soft-tissue]
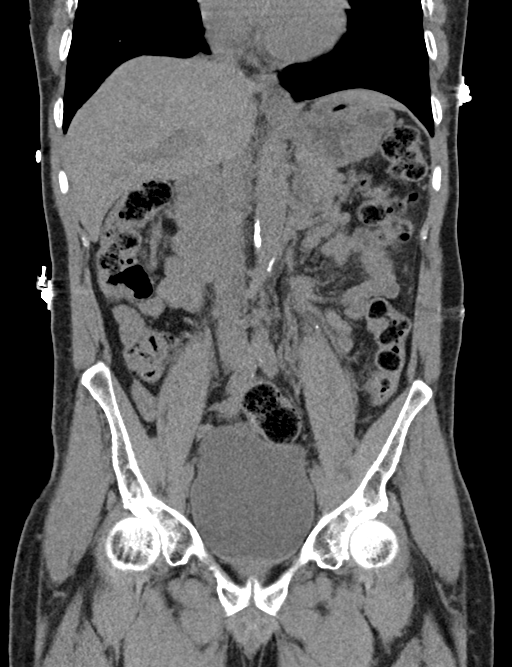
[im 41/74  soft-tissue]
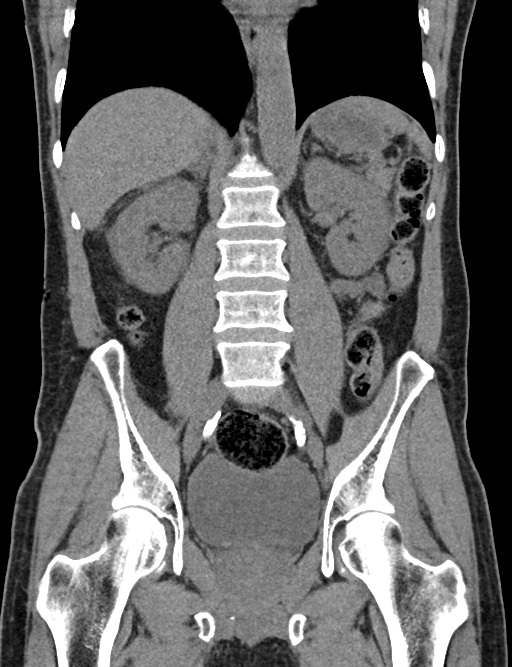

[15 of 46 positions shown; findings below may reference images not displayed]

FINDINGS: Lower chest: Breathing motion artifact. No consolidation or pleural
effusion. Small hiatal hernia.

Hepatobiliary: No evidence focal lesion allowing for lack contrast.
Gallbladder physiologically distended, no calcified stone. No
biliary dilatation.

Pancreas: No ductal dilatation or inflammation. Lack of contrast and
paucity of intra-abdominal fat limits detailed assessment.

Spleen: Normal in size without focal abnormality.

Adrenals/Urinary Tract: No adrenal nodule. There is bilateral
perinephric edema, right greater than left. This is seen on remote
prior exam. No urolithiasis. No hydronephrosis. Ureters are
decompressed. Urinary bladder is distended, no bladder wall
thickening.

Stomach/Bowel: Moderate stool throughout the colon with stool
distending the rectum, question constipation. Small bowel is
decompressed and not well evaluated. The stomach is decompressed.
Small hiatal hernia. Portions of the appendix are tentatively
identified, no pericecal or right lower quadrant inflammation to
suggest appendicitis.

Vascular/Lymphatic: Aortic atherosclerosis without aneurysm. No
bulky adenopathy, and decreased sensitivity given lack of contrast
and paucity of intra-abdominal fat.

Reproductive: There is a well-defined 3.3 x 2.4 cm water density
lesion in the right upper abdomen abutting the distal stomach. No
surrounding inflammation. This is unchanged in size from prior exam.

Other: No free fluid. No free air. Tiny fat containing umbilical
hernia.

Musculoskeletal: There are no acute or suspicious osseous
abnormalities.
IMPRESSION: 1. Perinephric edema about both kidneys, may be chronic and was seen
on prior exam. This suggest underlying urinary tract infection or
renal inflammation. No urolithiasis.
2. Well-defined fluid density structure in the right upper abdomen
measuring 3.3 cm, unchanged from prior exam. This is likely
duplication or peritoneal cyst. No inflammation. Unchanged size of a
course of 6 years is consistent with a benign etiology.
3. Aortic atherosclerosis.  No aneurysm.
4. Moderate stool burden throughout the colon, suggesting
constipation. No obstruction.

## 2019-08-15 ENCOUNTER — Ambulatory Visit: Payer: Medicare Other | Attending: Internal Medicine

## 2019-08-15 DIAGNOSIS — Z23 Encounter for immunization: Secondary | ICD-10-CM

## 2019-08-15 NOTE — Progress Notes (Signed)
   Covid-19 Vaccination Clinic  Name:  Abednego Yeates    MRN: [id number] DOB: December 21, 1950  08/15/2019  Mr. Komatsu was observed post Covid-19 immunization for 15 minutes without incident. He was provided with Vaccine Information Sheet and instruction to access the V-Safe system.   Mr. Brueckner was instructed to call 911 with any severe reactions post vaccine: Marland Kitchen Difficulty breathing  . Swelling of face and throat  . A fast heartbeat  . A bad rash all over body  . Dizziness and weakness   Immunizations Administered    Name Date Dose VIS Date Route   Pfizer COVID-19 Vaccine 08/15/2019  9:18 AM 0.3 mL 05/10/2019 Intramuscular   Manufacturer: [place]   Lot: [id number]   [place]: [address]-0

## 2019-09-10 ENCOUNTER — Ambulatory Visit: Payer: Medicare Other

## 2019-09-11 ENCOUNTER — Ambulatory Visit: Payer: Medicare Other | Attending: Internal Medicine

## 2019-09-11 DIAGNOSIS — Z23 Encounter for immunization: Secondary | ICD-10-CM

## 2019-09-11 NOTE — Progress Notes (Signed)
   Covid-19 Vaccination Clinic  Name:  Milind Raether    MRN: [id number] DOB: Feb 20, 1951  09/11/2019  Mr. Rutt was observed post Covid-19 immunization for 15 minutes without incident. He was provided with Vaccine Information Sheet and instruction to access the V-Safe system.   Mr. Vandrunen was instructed to call 911 with any severe reactions post vaccine: Marland Kitchen Difficulty breathing  . Swelling of face and throat  . A fast heartbeat  . A bad rash all over body  . Dizziness and weakness   Immunizations Administered    Name Date Dose VIS Date Route   Pfizer COVID-19 Vaccine 09/11/2019  1:03 PM 0.3 mL 05/10/2019 Intramuscular   Manufacturer: [place]   Lot: [id number]   [place]: [id number]

## 2019-12-11 NOTE — Progress Notes (Signed)
Cardiology Office Note    Date:  12/17/2019   ID:  Shawn Collins, DOB 1951/02/02, MRN [id number]  PCP:  Nolene Ebbs, MD  Cardiologist: Fransico Him, MD EPS: None  Chief Complaint  Patient presents with  . Follow-up    History of Present Illness:  Shawn Collins is a 69 y.o. male  with a hx of ASCAD s/p NSTEMI with cath showing severe 3VD s/p  CABG x 3 with LIMA to LAD, SVG to OM1/OM2 and SVG D1 by Dr. Servando Snare on 10/26/16. He also has CKD, DM, HTN and HLD.   Last saw Dr. Radford Pax 2019 and had some DOE so 2Decho ordered and was normal.   Patient comes in for fu. Denies chest tightness, dyspnea, dizziness, palpitations, edema. Walks up to a mile daily. Doesn't think he's been taking his ASA or a medicine the kidney doctors gave him since Covid19.   Past Medical History:  Diagnosis Date  . CAD (coronary artery disease), native coronary artery    s/p NSTEMI with cath showing severe 3VD s/p  CABG x 3 with LIMA to LAD, SVG to OM1/OM2 and SVG D1 by Dr. Servando Snare on 10/26/16.  . CKD (chronic kidney disease) stage 3, GFR 30-59 ml/min 09/2016  . COPD (chronic obstructive pulmonary disease) ([place]) 09/10/2014  . Coronary artery disease involving native coronary artery of native heart with unstable angina pectoris ([place])   . Diabetes mellitus without complication ([place])   . Dizzy-->?Orthostatic? 09/10/2014  . Emphysema lung ([place])   . Hyperlipidemia LDL goal <70 04/17/2017  . Hypertension   . Hypoxia 09/10/2014    Past Surgical History:  Procedure Laterality Date  . CORONARY ARTERY BYPASS GRAFT N/A 10/26/2016   Procedure: CORONARY ARTERY BYPASS GRAFTING (CABG) x 4 USING LEFT INTERNAL MAMMARY ARTERY TO LAD AND ENDOSCOPICALLY HARVESTED GREATER SAPHENOUS VEIN TO OM 1, 2 AND TO DIAG;  Surgeon: Grace Isaac, MD;  Location: [facility];  Service: Open Heart Surgery;  Laterality: N/A;  . ENDOVEIN HARVEST OF GREATER SAPHENOUS VEIN Right 10/26/2016   Procedure: ENDOVEIN HARVEST OF GREATER SAPHENOUS VEIN;   Surgeon: Grace Isaac, MD;  Location: [facility];  Service: Open Heart Surgery;  Laterality: Right;  . IR THORACENTESIS ASP PLEURAL SPACE W/IMG GUIDE  11/03/2016  . LEFT HEART CATH AND CORONARY ANGIOGRAPHY N/A 10/21/2016   Procedure: Left Heart Cath and Coronary Angiography;  Surgeon: Martinique, Peter M, MD;  Location: [facility];  Service: Cardiovascular;  Laterality: N/A;  . TEE WITHOUT CARDIOVERSION N/A 10/26/2016   Procedure: TRANSESOPHAGEAL ECHOCARDIOGRAM (TEE);  Surgeon: Grace Isaac, MD;  Location: [facility];  Service: Open Heart Surgery;  Laterality: N/A;    Current Medications: Current Meds  Medication Sig  . amLODipine (NORVASC) 10 MG tablet Take 1 tablet (10 mg total) by mouth daily.  Marland Kitchen aspirin EC 81 MG tablet Take 1 tablet (81 mg total) by mouth daily.  Marland Kitchen atorvastatin (LIPITOR) 10 MG tablet Take 10 mg by mouth daily.  . Cholecalciferol (VITAMIN D3) 5000 units CAPS Take 1 capsule by mouth daily.  . furosemide (LASIX) 40 MG tablet Take 40 mg by mouth daily.  . Insulin Glargine (TOUJEO SOLOSTAR) 300 UNIT/ML SOPN Inject 15 Units into the skin at bedtime.   . metoprolol tartrate (LOPRESSOR) 25 MG tablet Take 0.5 tablets (12.5 mg total) by mouth 2 (two) times daily.     Allergies:   No known allergies   Social History   Socioeconomic History  . Marital status: Single  Spouse name: Not on file  . Number of children: Not on file  . Years of education: Not on file  . Highest education level: Not on file  Occupational History  . Not on file  Tobacco Use  . Smoking status: Former Smoker    Packs/day: 0.25    Years: 8.00    Pack years: 2.00    Types: Cigarettes    Quit date: 1972    Years since quitting: 49.5  . Smokeless tobacco: Never Used  Vaping Use  . Vaping Use: Never used  Substance and Sexual Activity  . Alcohol use: No  . Drug use: No  . Sexual activity: Not on file  Other Topics Concern  . Not on file  Social History Narrative  . Not on file    Social Determinants of Health   Financial Resource Strain:   . Difficulty of Paying Living Expenses:   Food Insecurity:   . Worried About Charity fundraiser in the Last Year:   . Arboriculturist in the Last Year:   Transportation Needs:   . Film/video editor (Medical):   Marland Kitchen Lack of Transportation (Non-Medical):   Physical Activity:   . Days of Exercise per Week:   . Minutes of Exercise per Session:   Stress:   . Feeling of Stress :   Social Connections:   . Frequency of Communication with Friends and Family:   . Frequency of Social Gatherings with Friends and Family:   . Attends Religious Services:   . Active Member of Clubs or Organizations:   . Attends Archivist Meetings:   Marland Kitchen Marital Status:      Family History:  The patient's family history includes Diabetes Mellitus II in his mother and sister; Hypertension in his father; Stroke in his father.   ROS:   Please see the history of present illness.    ROS All other systems reviewed and are negative.   PHYSICAL EXAM:   VS:  BP (!) 170/86   Pulse 64   Ht 5' 8.5" (1.74 m)   Wt 146 lb (66.2 kg)   SpO2 98%   BMI 21.88 kg/m   Physical Exam  GEN: Thin, in no acute distress  Neck: no JVD, carotid bruits, or masses Cardiac:RRR; S4 1/6 systolic murmur LSB Respiratory:  clear to auscultation bilaterally, normal work of breathing GI: soft, nontender, nondistended, + BS Ext: without cyanosis, clubbing, or edema, Good distal pulses bilaterally Neuro:  Alert and Oriented x 3 Psych: euthymic mood, full affect  Wt Readings from Last 3 Encounters:  12/17/19 146 lb (66.2 kg)  11/22/17 147 lb 12.8 oz (67 kg)  05/04/17 145 lb 1 oz (65.8 kg)      Studies/Labs Reviewed:   EKG:  EKG is ordered today.  The ekg ordered today demonstrates NSR, nonspecific ST changes  no acute change   Recent Labs: No results found for requested labs within last 8760 hours.   Lipid Panel    Component Value Date/Time   CHOL  116 12/01/2017 0915   TRIG 66 12/01/2017 0915   HDL 38 (L) 12/01/2017 0915   CHOLHDL 3.1 12/01/2017 0915   CHOLHDL 3.3 10/16/2016 0512   VLDL 10 10/16/2016 0512   LDLCALC 65 12/01/2017 0915    Additional studies/ records that were reviewed today include:  Echo 12/02/19 Study Conclusions   - Left ventricle: The cavity size was normal. Systolic function was    normal. The estimated ejection fraction  was in the range of 60%    to 65%. Wall motion was normal; there were no regional wall    motion abnormalities. Left ventricular diastolic function    parameters were normal.  - Atrial septum: No defect or patent foramen ovale was identified.   -------------------------------------------------------------------  Labs, prior tests, procedures, and surgery:  Transthoracic echocardiography (08/16/2016).     EF was 60%.   Coronary artery bypass grafting.   -------------------------------------------------------------------  Study data:  Comparison was made to the study of 08/16/2016.  Study  status:  Routine.  Procedure:  The patient reported no pain pre or  post test. Transthoracic echocardiography. Image quality was  adequate.  Study completion:  There were no complications.  Transthoracic echocardiography.  M-mode, complete 2D, 3D, spectral  Doppler, and color Doppler.  Birthdate:  Patient birthdate:  1950/08/23.  Age:  Patient is 70 yr old.  Sex:  Gender: male.  BMI: 22.1 kg/m^2.  Blood pressure:     120/82  Patient status:  Outpatient.  Study date:  Study date: 12/01/2017. Study time: 09:17  AM.  Location:  [facility]   -------------------------------------------------------------------   -------------------------------------------------------------------  Left ventricle:  The cavity size was normal. Systolic function was  normal. The estimated ejection fraction was in the range of 60% to  65%. Wall motion was normal; there were no regional wall motion  abnormalities. The  transmitral flow pattern was normal. The  deceleration time of the early transmitral flow velocity was  normal. The pulmonary vein flow pattern was normal. The tissue  Doppler parameters were normal. Left ventricular diastolic function  parameters were normal.   -------------------------------------------------------------------  Aortic valve:   Trileaflet; normal thickness leaflets. Mobility was  not restricted.  Doppler:  Transvalvular velocity was within the  normal range. There was no stenosis. There was no regurgitation.    -------------------------------------------------------------------  Aorta: The aorta was normal, not dilated, and non-diseased. Aortic  root: The aortic root was normal in size.   -------------------------------------------------------------------  Mitral valve:   Mildly thickened leaflets . Mobility was not  restricted.  Doppler:  Transvalvular velocity was within the normal  range. There was no evidence for stenosis. There was no significant  regurgitation.    Valve area by pressure half-time: 2.5 cm^2.  Indexed valve area by pressure half-time: 1.39 cm^2/m^2.    Peak  gradient (D): 2 mm Hg.   -------------------------------------------------------------------  Left atrium:  The atrium was normal in size.   -------------------------------------------------------------------  Atrial septum:  No defect or patent foramen ovale was identified.    -------------------------------------------------------------------  Right ventricle:  The cavity size was normal. Wall thickness was  normal. Systolic function was normal.   -------------------------------------------------------------------  Pulmonic valve:    Doppler:  Transvalvular velocity was within the  normal range. There was no evidence for stenosis. There was trivial  regurgitation.   -------------------------------------------------------------------  Tricuspid valve:   Structurally normal valve.     Doppler:  Transvalvular velocity was within the normal range. There was  trivial regurgitation.   -------------------------------------------------------------------  Pulmonary artery:   The main pulmonary artery was normal-sized.  Systolic pressure was within the normal range.   -------------------------------------------------------------------  Right atrium:  The atrium was normal in size.   -------------------------------------------------------------------  Pericardium: The pericardium was normal in appearance. There was  no pericardial effusion.   -------------------------------------------------------------------  Systemic veins:  Inferior vena cava: The vessel was normal in size. The  respirophasic diameter changes were in the normal range (>= 50%),  consistent  with normal central venous pressure.   -------------------------------------------------------------------  Post procedure conclusions  Ascending Aorta:   - The aorta was normal, not dilated, and non-diseased.   ---------------------------------------      ASSESSMENT:    1. Coronary artery disease involving coronary bypass graft of native heart without angina pectoris   2. Essential hypertension   3. Hyperlipidemia, unspecified hyperlipidemia type   4. Stage 3 chronic kidney disease, unspecified whether stage 3a or 3b CKD      PLAN:  In order of problems listed above:   CAD - s/p NSTEMI with cath showing severe 3VD s/p  CABG x 3 with LIMA to LAD, SVG to OM1/OM2 and SVG D1 by Dr. Servando Snare on 10/26/16. Echo 11/2017 normal LVEF.  No angina. Restart ASA 81 mg daily. Check surveillance labs.     Hypertension - BP running high-stopped a medicine from kidney specialist pre covid. Hasn't been checking. Will give BP cuff. Start hydralazine 25 mg tid and f/u    Hyperlipidemia -on lipitor 10 mg daily-needs FLP  CKD-Crt 2.12  04/2019. Will recheck today and patient to make appt with renal.    Medication  Adjustments/Labs and Tests Ordered: Current medicines are reviewed at length with the patient today.  Concerns regarding medicines are outlined above.  Medication changes, Labs and Tests ordered today are listed in the Patient Instructions below. Patient Instructions  Medication Instructions:  Your physician has recommended you make the following change in your medication:   START Hydralazine 25mg  three times daily RESUME Aspirin 81mg  daily  *If you need a refill on your cardiac medications before your next appointment, please call your pharmacy*   Lab Work: CMET, CBC, Lipid panel Today  If you have labs (blood work) drawn today and your tests are completely normal, you will receive your results only by: Marland Kitchen MyChart Message (if you have MyChart) OR . A paper copy in the mail If you have any lab test that is abnormal or we need to change your treatment, we will call you to review the results.   Testing/Procedures: None   Follow-Up: At [facility], you and your health needs are our priority.  As part of our continuing mission to provide you with exceptional heart care, we have created designated Provider Care Teams.  These Care Teams include your primary Cardiologist (physician) and Advanced Practice Providers (APPs -  Physician Assistants and Nurse Practitioners) who all work together to provide you with the care you need, when you need it.  We recommend signing up for the patient portal called "MyChart".  Sign up information is provided on this After Visit Summary.  MyChart is used to connect with patients for Virtual Visits (Telemedicine).  Patients are able to view lab/test results, encounter notes, upcoming appointments, etc.  Non-urgent messages can be sent to your provider as well.   To learn more about what you can do with MyChart, go to [email or web address].    Your next appointment:   01/29/2020   The format for your next appointment:   In Person  Provider:   Fransico Him, MD   Other Instructions Make a follow up appointment with your Kidney Dr     Weston Brass, Ermalinda Barrios, PA-C  12/17/2019 12:59 PM    [address], [place], [place]  [address] Phone: [phone]; Fax: [phone]

## 2019-12-17 ENCOUNTER — Encounter: Payer: Self-pay | Admitting: Physician Assistant

## 2019-12-17 ENCOUNTER — Other Ambulatory Visit: Payer: Self-pay

## 2019-12-17 ENCOUNTER — Ambulatory Visit (INDEPENDENT_AMBULATORY_CARE_PROVIDER_SITE_OTHER): Payer: Medicare Other | Admitting: Physician Assistant

## 2019-12-17 VITALS — BP 170/86 | HR 64 | Ht 68.5 in | Wt 146.0 lb

## 2019-12-17 DIAGNOSIS — E785 Hyperlipidemia, unspecified: Secondary | ICD-10-CM | POA: Diagnosis not present

## 2019-12-17 DIAGNOSIS — I25810 Atherosclerosis of coronary artery bypass graft(s) without angina pectoris: Secondary | ICD-10-CM | POA: Diagnosis not present

## 2019-12-17 DIAGNOSIS — N183 Chronic kidney disease, stage 3 unspecified: Secondary | ICD-10-CM

## 2019-12-17 DIAGNOSIS — I10 Essential (primary) hypertension: Secondary | ICD-10-CM | POA: Diagnosis not present

## 2019-12-17 MED ORDER — HYDRALAZINE HCL 25 MG PO TABS: 25.0000 mg | ORAL_TABLET | Freq: Three times a day (TID) | ORAL | 3 refills | Status: DC

## 2019-12-17 NOTE — Patient Instructions (Signed)
Medication Instructions:  Your physician has recommended you make the following change in your medication:   START Hydralazine 25mg  three times daily RESUME Aspirin 81mg  daily  *If you need a refill on your cardiac medications before your next appointment, please call your pharmacy*   Lab Work: CMET, CBC, Lipid panel Today  If you have labs (blood work) drawn today and your tests are completely normal, you will receive your results only by: Marland Kitchen MyChart Message (if you have MyChart) OR . A paper copy in the mail If you have any lab test that is abnormal or we need to change your treatment, we will call you to review the results.   Testing/Procedures: None   Follow-Up: At [facility], you and your health needs are our priority.  As part of our continuing mission to provide you with exceptional heart care, we have created designated Provider Care Teams.  These Care Teams include your primary Cardiologist (physician) and Advanced Practice Providers (APPs -  Physician Assistants and Nurse Practitioners) who all work together to provide you with the care you need, when you need it.  We recommend signing up for the patient portal called "MyChart".  Sign up information is provided on this After Visit Summary.  MyChart is used to connect with patients for Virtual Visits (Telemedicine).  Patients are able to view lab/test results, encounter notes, upcoming appointments, etc.  Non-urgent messages can be sent to your provider as well.   To learn more about what you can do with MyChart, go to [email or web address].    Your next appointment:   01/29/2020   The format for your next appointment:   In Person  Provider:   Fransico Him, MD   Other Instructions Make a follow up appointment with your Kidney Dr

## 2019-12-18 ENCOUNTER — Telehealth: Payer: Self-pay

## 2019-12-18 LAB — COMPREHENSIVE METABOLIC PANEL
ALT: 22 IU/L (ref 0–44)
AST: 29 IU/L (ref 0–40)
Albumin/Globulin Ratio: 1.3 (ref 1.2–2.2)
Albumin: 4 g/dL (ref 3.7–4.7)
Alkaline Phosphatase: 63 IU/L (ref 48–121)
BUN/Creatinine Ratio: 13 (ref 10–24)
BUN: 30 mg/dL — ABNORMAL HIGH (ref 8–27)
Bilirubin Total: 0.4 mg/dL (ref 0.0–1.2)
CO2: 24 mmol/L (ref 20–29)
Calcium: 9.2 mg/dL (ref 8.6–10.2)
Chloride: 102 mmol/L (ref 96–106)
Creatinine, Ser: 2.37 mg/dL — ABNORMAL HIGH (ref 0.76–1.27)
GFR calc Af Amer: 31 mL/min/{1.73_m2} — ABNORMAL LOW (ref >59)
GFR calc non Af Amer: 27 mL/min/{1.73_m2} — ABNORMAL LOW (ref >59)
Globulin, Total: 3.1 g/dL (ref 1.5–4.5)
Glucose: 89 mg/dL (ref 65–99)
Potassium: 4.8 mmol/L (ref 3.5–5.2)
Sodium: 138 mmol/L (ref 134–144)
Total Protein: 7.1 g/dL (ref 6.0–8.5)

## 2019-12-18 LAB — LIPID PANEL
Chol/HDL Ratio: 3.6 ratio (ref 0.0–5.0)
Cholesterol, Total: 150 mg/dL (ref 100–199)
HDL: 42 mg/dL (ref >39)
LDL Chol Calc (NIH): 91 mg/dL (ref 0–99)
Triglycerides: 89 mg/dL (ref 0–149)
VLDL Cholesterol Cal: 17 mg/dL (ref 5–40)

## 2019-12-18 LAB — CBC
Hematocrit: 37 % — ABNORMAL LOW (ref 37.5–51.0)
Hemoglobin: 12.2 g/dL — ABNORMAL LOW (ref 13.0–17.7)
MCH: 30 pg (ref 26.6–33.0)
MCHC: 33 g/dL (ref 31.5–35.7)
MCV: 91 fL (ref 79–97)
Platelets: 282 10*3/uL (ref 150–450)
RBC: 4.07 x10E6/uL — ABNORMAL LOW (ref 4.14–5.80)
RDW: 12.9 % (ref 11.6–15.4)
WBC: 6.1 10*3/uL (ref 3.4–10.8)

## 2019-12-18 MED ORDER — ATORVASTATIN CALCIUM 20 MG PO TABS: 20.0000 mg | ORAL_TABLET | Freq: Every day | ORAL | 3 refills | Status: DC

## 2019-12-18 NOTE — Telephone Encounter (Signed)
Spoke to pt. Went over lab results. Sent in new Rx for Atorvastatin 20mg  daily. Advised him to make appt with Renal Dr. Aron Baba appt for FLP

## 2020-01-29 ENCOUNTER — Ambulatory Visit: Payer: Medicare Other | Admitting: Cardiology

## 2020-02-17 NOTE — Progress Notes (Signed)
Cardiology Office Note    Date:  02/18/2020   ID:  Shawn Collins, DOB 02-Aug-1950, MRN [id number]  PCP:  Nolene Ebbs, MD  Cardiologist: Fransico Him, MD EPS: None  Chief Complaint  Patient presents with  . Coronary Artery Disease  . Hypertension  . Hyperlipidemia    History of Present Illness:  Shawn Collins is a 69 y.o. male  with a hx of ASCAD s/p NSTEMI with cath showing severe 3VD s/p  CABG x 3 with LIMA to LAD, SVG to OM1/OM2 and SVG D1 by Dr. Servando Snare on 10/26/16. He also has CKD, DM, HTN and HLD.   He is here today for followup and is doing well.  He denies any chest pain or pressure, PND, orthopnea, LE edema, dizziness, palpitations or syncope.  He has chronic DOE which he thinks has gotten better. He is compliant with his meds and is tolerating meds with no SE.    Past Medical History:  Diagnosis Date  . CAD (coronary artery disease), native coronary artery    s/p NSTEMI with cath showing severe 3VD s/p  CABG x 3 with LIMA to LAD, SVG to OM1/OM2 and SVG D1 by Dr. Servando Snare on 10/26/16.  . CKD (chronic kidney disease) stage 3, GFR 30-59 ml/min 09/2016  . COPD (chronic obstructive pulmonary disease) ([place]) 09/10/2014  . Diabetes mellitus without complication ([place])   . Dizzy-->?Orthostatic? 09/10/2014  . Emphysema lung ([place])   . Hyperlipidemia LDL goal <70 04/17/2017  . Hypertension   . Hypoxia 09/10/2014    Past Surgical History:  Procedure Laterality Date  . CORONARY ARTERY BYPASS GRAFT N/A 10/26/2016   Procedure: CORONARY ARTERY BYPASS GRAFTING (CABG) x 4 USING LEFT INTERNAL MAMMARY ARTERY TO LAD AND ENDOSCOPICALLY HARVESTED GREATER SAPHENOUS VEIN TO OM 1, 2 AND TO DIAG;  Surgeon: Grace Isaac, MD;  Location: [facility];  Service: Open Heart Surgery;  Laterality: N/A;  . ENDOVEIN HARVEST OF GREATER SAPHENOUS VEIN Right 10/26/2016   Procedure: ENDOVEIN HARVEST OF GREATER SAPHENOUS VEIN;  Surgeon: Grace Isaac, MD;  Location: [facility];  Service: Open Heart Surgery;   Laterality: Right;  . IR THORACENTESIS ASP PLEURAL SPACE W/IMG GUIDE  11/03/2016  . LEFT HEART CATH AND CORONARY ANGIOGRAPHY N/A 10/21/2016   Procedure: Left Heart Cath and Coronary Angiography;  Surgeon: Martinique, Peter M, MD;  Location: [facility];  Service: Cardiovascular;  Laterality: N/A;  . TEE WITHOUT CARDIOVERSION N/A 10/26/2016   Procedure: TRANSESOPHAGEAL ECHOCARDIOGRAM (TEE);  Surgeon: Grace Isaac, MD;  Location: [facility];  Service: Open Heart Surgery;  Laterality: N/A;    Current Medications: Current Meds  Medication Sig  . amLODipine (NORVASC) 10 MG tablet Take 1 tablet (10 mg total) by mouth daily.  Marland Kitchen aspirin EC 81 MG tablet Take 1 tablet (81 mg total) by mouth daily.  Marland Kitchen atorvastatin (LIPITOR) 20 MG tablet Take 1 tablet (20 mg total) by mouth daily.  . Cholecalciferol (VITAMIN D3) 5000 units CAPS Take 1 capsule by mouth daily.  . furosemide (LASIX) 40 MG tablet Take 40 mg by mouth daily.  . hydrALAZINE (APRESOLINE) 25 MG tablet Take 1 tablet (25 mg total) by mouth 3 (three) times daily.  . Insulin Glargine (TOUJEO SOLOSTAR) 300 UNIT/ML SOPN Inject 15 Units into the skin at bedtime.   . metoprolol tartrate (LOPRESSOR) 25 MG tablet Take 0.5 tablets (12.5 mg total) by mouth 2 (two) times daily.     Allergies:   No known allergies   Social History  Socioeconomic History  . Marital status: Single    Spouse name: Not on file  . Number of children: Not on file  . Years of education: Not on file  . Highest education level: Not on file  Occupational History  . Not on file  Tobacco Use  . Smoking status: Former Smoker    Packs/day: 0.25    Years: 8.00    Pack years: 2.00    Types: Cigarettes    Quit date: 1972    Years since quitting: 49.7  . Smokeless tobacco: Never Used  Vaping Use  . Vaping Use: Never used  Substance and Sexual Activity  . Alcohol use: No  . Drug use: No  . Sexual activity: Not on file  Other Topics Concern  . Not on file  Social  History Narrative  . Not on file   Social Determinants of Health   Financial Resource Strain:   . Difficulty of Paying Living Expenses: Not on file  Food Insecurity:   . Worried About Charity fundraiser in the Last Year: Not on file  . Ran Out of Food in the Last Year: Not on file  Transportation Needs:   . Lack of Transportation (Medical): Not on file  . Lack of Transportation (Non-Medical): Not on file  Physical Activity:   . Days of Exercise per Week: Not on file  . Minutes of Exercise per Session: Not on file  Stress:   . Feeling of Stress : Not on file  Social Connections:   . Frequency of Communication with Friends and Family: Not on file  . Frequency of Social Gatherings with Friends and Family: Not on file  . Attends Religious Services: Not on file  . Active Member of Clubs or Organizations: Not on file  . Attends Archivist Meetings: Not on file  . Marital Status: Not on file     Family History:  The patient's family history includes Diabetes Mellitus II in his mother and sister; Hypertension in his father; Stroke in his father.   ROS:   Please see the history of present illness.    ROS All other systems reviewed and are negative.   PHYSICAL EXAM:   VS:  BP (!) 148/76   Pulse 62   Ht 5' 8.5" (1.74 m)   Wt 139 lb 12.8 oz (63.4 kg)   SpO2 98%   BMI 20.95 kg/m    Physical Exam  GEN: Well nourished, well developed in no acute distress HEENT: Normal NECK: No JVD; No carotid bruits LYMPHATICS: No lymphadenopathy CARDIAC:RRR, no murmurs, rubs, gallops RESPIRATORY:  Clear to auscultation without rales, wheezing or rhonchi  ABDOMEN: Soft, non-tender, non-distended MUSCULOSKELETAL:  No edema; No deformity  SKIN: Warm and dry NEUROLOGIC:  Alert and oriented x 3 PSYCHIATRIC:  Normal affect    Wt Readings from Last 3 Encounters:  02/18/20 139 lb 12.8 oz (63.4 kg)  12/17/19 146 lb (66.2 kg)  11/22/17 147 lb 12.8 oz (67 kg)      Studies/Labs  Reviewed:   EKG:  EKG is ordered today.    Recent Labs: 12/17/2019: ALT 22; BUN 30; Creatinine, Ser 2.37; Hemoglobin 12.2; Platelets 282; Potassium 4.8; Sodium 138   Lipid Panel    Component Value Date/Time   CHOL 150 12/17/2019 1243   TRIG 89 12/17/2019 1243   HDL 42 12/17/2019 1243   CHOLHDL 3.6 12/17/2019 1243   CHOLHDL 3.3 10/16/2016 0512   VLDL 10 10/16/2016 0512   LDLCALC 91  12/17/2019 1243    Additional studies/ records that were reviewed today include:  Echo 12/02/19 Study Conclusions   - Left ventricle: The cavity size was normal. Systolic function was    normal. The estimated ejection fraction was in the range of 60%    to 65%. Wall motion was normal; there were no regional wall    motion abnormalities. Left ventricular diastolic function    parameters were normal.  - Atrial septum: No defect or patent foramen ovale was identified.   -------------------------------------------------------------------  Labs, prior tests, procedures, and surgery:  Transthoracic echocardiography (08/16/2016).     EF was 60%.   Coronary artery bypass grafting.   -------------------------------------------------------------------  Study data:  Comparison was made to the study of 08/16/2016.  Study  status:  Routine.  Procedure:  The patient reported no pain pre or  post test. Transthoracic echocardiography. Image quality was  adequate.  Study completion:  There were no complications.  Transthoracic echocardiography.  M-mode, complete 2D, 3D, spectral  Doppler, and color Doppler.  Birthdate:  Patient birthdate:  1950-11-22.  Age:  Patient is 69 yr old.  Sex:  Gender: male.  BMI: 22.1 kg/m^2.  Blood pressure:     120/82  Patient status:  Outpatient.  Study date:  Study date: 12/01/2017. Study time: 09:17  AM.  Location:  [facility]   -------------------------------------------------------------------   -------------------------------------------------------------------  Left  ventricle:  The cavity size was normal. Systolic function was  normal. The estimated ejection fraction was in the range of 60% to  65%. Wall motion was normal; there were no regional wall motion  abnormalities. The transmitral flow pattern was normal. The  deceleration time of the early transmitral flow velocity was  normal. The pulmonary vein flow pattern was normal. The tissue  Doppler parameters were normal. Left ventricular diastolic function  parameters were normal.   -------------------------------------------------------------------  Aortic valve:   Trileaflet; normal thickness leaflets. Mobility was  not restricted.  Doppler:  Transvalvular velocity was within the  normal range. There was no stenosis. There was no regurgitation.    -------------------------------------------------------------------  Aorta: The aorta was normal, not dilated, and non-diseased. Aortic  root: The aortic root was normal in size.   -------------------------------------------------------------------  Mitral valve:   Mildly thickened leaflets . Mobility was not  restricted.  Doppler:  Transvalvular velocity was within the normal  range. There was no evidence for stenosis. There was no significant  regurgitation.    Valve area by pressure half-time: 2.5 cm^2.  Indexed valve area by pressure half-time: 1.39 cm^2/m^2.    Peak  gradient (D): 2 mm Hg.   -------------------------------------------------------------------  Left atrium:  The atrium was normal in size.   -------------------------------------------------------------------  Atrial septum:  No defect or patent foramen ovale was identified.    -------------------------------------------------------------------  Right ventricle:  The cavity size was normal. Wall thickness was  normal. Systolic function was normal.   -------------------------------------------------------------------  Pulmonic valve:    Doppler:  Transvalvular velocity was  within the  normal range. There was no evidence for stenosis. There was trivial  regurgitation.   -------------------------------------------------------------------  Tricuspid valve:   Structurally normal valve.    Doppler:  Transvalvular velocity was within the normal range. There was  trivial regurgitation.   -------------------------------------------------------------------  Pulmonary artery:   The main pulmonary artery was normal-sized.  Systolic pressure was within the normal range.   -------------------------------------------------------------------  Right atrium:  The atrium was normal in size.   -------------------------------------------------------------------  Pericardium: The pericardium was  normal in appearance. There was  no pericardial effusion.   -------------------------------------------------------------------  Systemic veins:  Inferior vena cava: The vessel was normal in size. The  respirophasic diameter changes were in the normal range (>= 50%),  consistent with normal central venous pressure.   -------------------------------------------------------------------  Post procedure conclusions  Ascending Aorta:   - The aorta was normal, not dilated, and non-diseased.   ---------------------------------------      ASSESSMENT:    1. Coronary artery disease involving native coronary artery of native heart without angina pectoris   2. Essential hypertension   3. Hyperlipidemia LDL goal <70      PLAN:  In order of problems listed above:   1.  CAD  -s/p NSTEMI with cath showing severe 3VD s/p  CABG x 3 with LIMA to LAD, SVG to OM1/OM2 and SVG D1 by Dr. Servando Snare on 10/26/16.  -Echo 11/2017 normal LVEF.   -he denies any anginal symptoms -continue ASA and statin   2.  Hypertension  -BP borderline controlled on exam but he has not taken his BP meds yet -continue amlodipine 10mg  daily, Hydralazine to 50mg  TID and Lopressor 12.5mg  BID   3.   Hyperlipidemia  -LDL goal < 70 -LDL was 91 in July and Lipitor was increased to 20mg  daily -repeat FLp and ALT   Medication Adjustments/Labs and Tests Ordered: Current medicines are reviewed at length with the patient today.  Concerns regarding medicines are outlined above.  Medication changes, Labs and Tests ordered today are listed in the Patient Instructions below. There are no Patient Instructions on file for this visit.   Signed, Fransico Him, MD  02/18/2020 9:23 AM    [address], [place], [place]  [id number] Phone: [phone]; Fax: [phone]

## 2020-02-18 ENCOUNTER — Ambulatory Visit (INDEPENDENT_AMBULATORY_CARE_PROVIDER_SITE_OTHER): Payer: Medicare Other | Admitting: Cardiology

## 2020-02-18 ENCOUNTER — Encounter: Payer: Self-pay | Admitting: Cardiology

## 2020-02-18 ENCOUNTER — Other Ambulatory Visit: Payer: Self-pay

## 2020-02-18 VITALS — BP 148/76 | HR 62 | Ht 68.5 in | Wt 139.8 lb

## 2020-02-18 DIAGNOSIS — I25810 Atherosclerosis of coronary artery bypass graft(s) without angina pectoris: Secondary | ICD-10-CM | POA: Diagnosis not present

## 2020-02-18 DIAGNOSIS — I2510 Atherosclerotic heart disease of native coronary artery without angina pectoris: Secondary | ICD-10-CM

## 2020-02-18 DIAGNOSIS — I10 Essential (primary) hypertension: Secondary | ICD-10-CM | POA: Diagnosis not present

## 2020-02-18 DIAGNOSIS — E785 Hyperlipidemia, unspecified: Secondary | ICD-10-CM | POA: Diagnosis not present

## 2020-02-18 LAB — LIPID PANEL
Chol/HDL Ratio: 3.2 ratio (ref 0.0–5.0)
Cholesterol, Total: 128 mg/dL (ref 100–199)
HDL: 40 mg/dL (ref >39)
LDL Chol Calc (NIH): 71 mg/dL (ref 0–99)
Triglycerides: 89 mg/dL (ref 0–149)
VLDL Cholesterol Cal: 17 mg/dL (ref 5–40)

## 2020-02-18 LAB — ALT: ALT: 19 IU/L (ref 0–44)

## 2020-02-18 NOTE — Addendum Note (Signed)
Addended by: Antonieta Iba on: 02/18/2020 09:27 AM   Modules accepted: Orders

## 2020-02-18 NOTE — Patient Instructions (Signed)
Medication Instructions:  Your physician recommends that you continue on your current medications as directed. Please refer to the Current Medication list given to you today.  *If you need a refill on your cardiac medications before your next appointment, please call your pharmacy*   Lab Work: TODAY: FLP and ALT If you have labs (blood work) drawn today and your tests are completely normal, you will receive your results only by: Marland Kitchen MyChart Message (if you have MyChart) OR . A paper copy in the mail If you have any lab test that is abnormal or we need to change your treatment, we will call you to review the results.   Follow-Up: At [facility], you and your health needs are our priority.  As part of our continuing mission to provide you with exceptional heart care, we have created designated Provider Care Teams.  These Care Teams include your primary Cardiologist (physician) and Advanced Practice Providers (APPs -  Physician Assistants and Nurse Practitioners) who all work together to provide you with the care you need, when you need it.  We recommend signing up for the patient portal called "MyChart".  Sign up information is provided on this After Visit Summary.  MyChart is used to connect with patients for Virtual Visits (Telemedicine).  Patients are able to view lab/test results, encounter notes, upcoming appointments, etc.  Non-urgent messages can be sent to your provider as well.   To learn more about what you can do with MyChart, go to [email or web address].    Your next appointment:   1 year(s)  The format for your next appointment:   In Person  Provider:   You may see Fransico Him, MD or one of the following Advanced Practice Providers on your designated Care Team:    Melina Copa, PA-C  Ermalinda Barrios, PA-C

## 2020-03-20 ENCOUNTER — Other Ambulatory Visit: Payer: Medicare Other

## 2021-02-23 DIAGNOSIS — Z992 Dependence on renal dialysis: Secondary | ICD-10-CM | POA: Insufficient documentation

## 2021-02-23 DIAGNOSIS — N186 End stage renal disease: Secondary | ICD-10-CM | POA: Insufficient documentation

## 2021-09-20 ENCOUNTER — Other Ambulatory Visit: Payer: Self-pay

## 2021-09-20 DIAGNOSIS — N183 Chronic kidney disease, stage 3 unspecified: Secondary | ICD-10-CM

## 2021-09-30 ENCOUNTER — Other Ambulatory Visit: Payer: Self-pay

## 2021-09-30 ENCOUNTER — Ambulatory Visit (HOSPITAL_COMMUNITY)
Admission: RE | Admit: 2021-09-30 | Discharge: 2021-09-30 | Disposition: A | Discharge status: Home / Self Care | Payer: Medicare Other | Source: Ambulatory Visit | Attending: Vascular Surgery | Admitting: Vascular Surgery

## 2021-09-30 ENCOUNTER — Encounter: Payer: Self-pay | Admitting: Vascular Surgery

## 2021-09-30 ENCOUNTER — Ambulatory Visit (INDEPENDENT_AMBULATORY_CARE_PROVIDER_SITE_OTHER)
Admission: RE | Admit: 2021-09-30 | Discharge: 2021-09-30 | Disposition: A | Discharge status: Home / Self Care | Payer: Medicare Other | Source: Ambulatory Visit | Attending: Vascular Surgery | Admitting: Vascular Surgery

## 2021-09-30 ENCOUNTER — Ambulatory Visit (INDEPENDENT_AMBULATORY_CARE_PROVIDER_SITE_OTHER): Payer: Medicare Other | Admitting: Vascular Surgery

## 2021-09-30 VITALS — BP 172/97 | HR 71 | Temp 98.1°F | Resp 20 | Ht 68.5 in | Wt 129.0 lb

## 2021-09-30 DIAGNOSIS — N183 Chronic kidney disease, stage 3 unspecified: Secondary | ICD-10-CM | POA: Diagnosis present

## 2021-09-30 DIAGNOSIS — Z992 Dependence on renal dialysis: Secondary | ICD-10-CM

## 2021-09-30 DIAGNOSIS — N186 End stage renal disease: Secondary | ICD-10-CM | POA: Diagnosis not present

## 2021-09-30 NOTE — Progress Notes (Signed)
? ?ASSESSMENT & PLAN  ? ?END-STAGE RENAL DISEASE: Based on the patient's vein map, he does not appear to be a candidate for an AV fistula.  I have explained however that I looked myself with the SonoSite and would try to do a fistula if I found a reasonable vein on the left arm.  As he is right-handed we will plan on access on the left side.  He would likely require placement of an AV graft. I have explained the indications for placement of an AV fistula or AV graft. I've explained that if at all possible we will place an AV fistula.  I have reviewed the risks of placement of an AV fistula including but not limited to: failure of the fistula to mature, need for subsequent interventions, and thrombosis. In addition I have reviewed the potential complications of placement of an AV graft. These risks include, but are not limited to, graft thrombosis, graft infection, wound healing problems, bleeding, arm swelling, and steal syndrome. All the patient's questions were answered and they are agreeable to proceed with surgery.  His surgery is scheduled for Tuesday, 10/05/2021 ? ?His blood pressure was elevated today but he did not take his blood pressure medicines.  We did discuss with him the importance of taking his blood pressure medicines specially on the day of surgery so surgery does not get canceled. ? ?REASON FOR CONSULT:   ? ?For hemodialysis access.  The consult is requested by Dr. Pearson Grippe ? ?HPI:  ? ?Shawn Collins is a 71 y.o. male who was referred for hemodialysis access.  He currently has a functioning tunneled dialysis catheter.  He is right-handed.  He dialyzes on Monday Wednesdays and Fridays.  We are asked to place a fistula if possible and if this were not possible to proceed with placement of a graft.  I have reviewed the records from the referring office.  The patient has end-stage renal disease and began dialysis about 2 months ago.  I believe this is secondary to diabetes. ? ?He is right-handed.   He does not have a pacemaker.  He is not on blood thinners. ? ?He denies any recent uremic symptoms. ? ?He has undergone previous coronary revascularization has had no problems since that.  That was in 2019.  He denies any chest pain.  He denies any history of congestive heart failure. ? ?Past Medical History:  ?Diagnosis Date  ? CAD (coronary artery disease), native coronary artery   ? s/p NSTEMI with cath showing severe 3VD s/p  CABG x 3 with LIMA to LAD, SVG to OM1/OM2 and SVG D1 by Dr. Servando Snare on 10/26/16.  ? CKD (chronic kidney disease) stage 3, GFR 30-59 ml/min ([place]) 09/2016  ? COPD (chronic obstructive pulmonary disease) ([place]) 09/10/2014  ? Diabetes mellitus without complication ([place])   ? Dizzy-->?Orthostatic? 09/10/2014  ? Emphysema lung ([place])   ? Hyperlipidemia LDL goal <70 04/17/2017  ? Hypertension   ? Hypoxia 09/10/2014  ? ? ?Family History  ?Problem Relation Age of Onset  ? Diabetes Mellitus II Mother   ? Stroke Father   ? Hypertension Father   ? Diabetes Mellitus II Sister   ? ? ?SOCIAL HISTORY: ?Social History  ? ?Tobacco Use  ? Smoking status: Former  ?  Packs/day: 0.25  ?  Years: 8.00  ?  Pack years: 2.00  ?  Types: Cigarettes  ?  Quit date: 56  ?  Years since quitting: 51.3  ? Smokeless tobacco: Never  ?Substance Use  Topics  ? Alcohol use: No  ? ? ?Allergies  ?Allergen Reactions  ? No Known Allergies   ? ? ?Current Outpatient Medications  ?Medication Sig Dispense Refill  ? amLODipine (NORVASC) 10 MG tablet Take 1 tablet (10 mg total) by mouth daily. 30 tablet 0  ? aspirin EC 81 MG tablet Take 1 tablet (81 mg total) by mouth daily. 90 tablet 3  ? carvedilol (COREG) 6.25 MG tablet Take 6.25 mg by mouth 2 (two) times daily.    ? Cholecalciferol (VITAMIN D3) 5000 units CAPS Take 1 capsule by mouth daily.    ? furosemide (LASIX) 40 MG tablet Take 40 mg by mouth daily.    ? GLIMEPIRIDE PO Glimepiride    ? Insulin Glargine 300 UNIT/ML SOPN Inject 15 Units into the skin at bedtime.     ? losartan (COZAAR)  25 MG tablet Take 25 mg by mouth daily.    ? metoprolol tartrate (LOPRESSOR) 25 MG tablet Take 0.5 tablets (12.5 mg total) by mouth 2 (two) times daily. 30 tablet 1  ? SITagliptin-metFORMIN HCl (JANUMET PO) Janumet    ? VELPHORO 500 MG chewable tablet Chew 500 mg by mouth 3 (three) times daily.    ? atorvastatin (LIPITOR) 20 MG tablet Take 1 tablet (20 mg total) by mouth daily. 90 tablet 3  ? hydrALAZINE (APRESOLINE) 25 MG tablet Take 1 tablet (25 mg total) by mouth 3 (three) times daily. 270 tablet 3  ? nitroGLYCERIN (NITROSTAT) 0.4 MG SL tablet Place 1 tablet (0.4 mg total) under the tongue every 5 (five) minutes as needed. 25 tablet 3  ? ?No current facility-administered medications for this visit.  ? ? ?REVIEW OF SYSTEMS:  ?[X]  denotes positive finding, [ ]  denotes negative finding ?Cardiac  Comments:  ?Chest pain or chest pressure:    ?Shortness of breath upon exertion:    ?Short of breath when lying flat:    ?Irregular heart rhythm:    ?    ?Vascular    ?Pain in calf, thigh, or hip brought on by ambulation:    ?Pain in feet at night that wakes you up from your sleep:     ?Blood clot in your veins:    ?Leg swelling:     ?    ?Pulmonary    ?Oxygen at home:    ?Productive cough:     ?Wheezing:     ?    ?Neurologic    ?Sudden weakness in arms or legs:  x   ?Sudden numbness in arms or legs:     ?Sudden onset of difficulty speaking or slurred speech:    ?Temporary loss of vision in one eye:     ?Problems with dizziness:     ?    ?Gastrointestinal    ?Blood in stool:     ?Vomited blood:     ?    ?Genitourinary    ?Burning when urinating:     ?Blood in urine:    ?    ?Psychiatric    ?Major depression:     ?    ?Hematologic    ?Bleeding problems:    ?Problems with blood clotting too easily:    ?    ?Skin    ?Rashes or ulcers:    ?    ?Constitutional    ?Fever or chills:    ?- ? ?PHYSICAL EXAM:  ? ?Vitals:  ? 09/30/21 1252  ?BP: (!) 172/97  ?Pulse: 71  ?Resp: 20  ?Temp: 98.1 ?F (36.7 ?  C)  ?SpO2: 98%  ?Weight: 129 lb  (58.5 kg)  ?Height: 5' 8.5" (1.74 m)  ? ?Body mass index is 19.33 kg/m?. ?GENERAL: The patient is a well-nourished male, in no acute distress. The vital signs are documented above. ?CARDIAC: There is a regular rate and rhythm.  ?VASCULAR: I do not detect carotid bruits. ?He has palpable radial pulses. ?He has no upper extremity swelling. ?PULMONARY: There is good air exchange bilaterally without wheezing or rales. ?ABDOMEN: Soft and non-tender with normal pitched bowel sounds.  ?MUSCULOSKELETAL: There are no major deformities. ?NEUROLOGIC: No focal weakness or paresthesias are detected. ?SKIN: There are no ulcers or rashes noted. ?PSYCHIATRIC: The patient has a normal affect. ? ?DATA:   ? ?UPPER EXTREMITY ARTERIAL DUPLEX: I have independently interpreted his upper extremity arterial duplex scan. ? ?On the right side the brachial artery measures 5.3 mm in diameter.  There is a triphasic radial and ulnar waveform with the Doppler. ? ?On the left side the brachial artery measures 5.0 mm in diameter.  There is a triphasic radial and ulnar waveform with the Doppler. ? ?UPPER EXTREMITY VEIN MAP: I independently interpreted his upper extremity vein map today. ? ?On the right side the forearm and upper arm cephalic vein are not adequate in size.  In multiple areas it could not even be visualized.  There is thrombus in the basilic vein. ?On the left side the upper arm and cephalic vein are not adequate in size.  The basilic vein is very small with diameters ranging from 1.5-2.2 mm. ? ? ?Deitra Mayo ?[facility] ?

## 2021-10-04 ENCOUNTER — Encounter (HOSPITAL_COMMUNITY): Payer: Self-pay | Admitting: Physician Assistant

## 2021-10-04 ENCOUNTER — Other Ambulatory Visit: Payer: Self-pay

## 2021-10-04 ENCOUNTER — Encounter (HOSPITAL_COMMUNITY): Payer: Self-pay | Admitting: Vascular Surgery

## 2021-10-04 NOTE — Progress Notes (Signed)
Mr. Krupinski denies chest pain or shortness of breath.  Patient denies having any s/s of Covid in his household.  Patient denies any known exposure to Covid.  ? ?Mr. Mehlhoff's PCP is Dr. Revonda Humphrey. ?Mr. Marhefka goes to [facility].  ? ?Mr. Glasser has type II diabetes, he has a CBG machine, but does not check CBG's. Mr. Purnell reports that he does not take any medications, he is not out of all of his medications.  Patient said he is not sure why he is not taking medications.  I encouraged patient to take his mediations before he has more issues with his body from not taking them.  Mr. Rosenfield said he is going to go back to Dr. Jeanie Cooks and get medication straighten out.  ?I called [facility] and asked how his blood pressure and blood sugars run. A nurse said that CBGs are not taken unless patients has symptoms.  I was told that Mr. Cabanilla's blood pressure is not too high, it always drops at the end of the treatment. ? ?I instructed Mr. Burridge to take 7 units of tonight.  Do not take any medications for diabetes in am.  Patient said he will check CBG in am .I instructed patient to check CBG after awaking and every 2 hours until arrival  to the hospital.  ?I Instructed patient if CBG is less than 70 to take 4 Glucose Tablets or 1 tube of Glucose Gel or 1/2 cup of a clear juice. Recheck CBG in 15 minutes if CBG is not over 70 call, pre- op desk at [phone] for further instructions.  ? ?Mr. Sigal is working on transportation and someone he can stay with, [patient lives in a boarding house. ? ?I instructed  Mr. Roggenkamp  to shower with antibacteria soap. No nail polish, artificial or acrylic nails. Wear clean clothes, brush your teeth. ?Glasses, contact lens,dentures or partials may not be worn in the OR. If you need to wear them, please bring a case for glasses, do not wear contacts or bring a case, the hospital does not have contact cases, dentures or partials  will have to be removed , make sure they are clean, we will provide a denture cup to put them in. You will need some one to drive you home and a responsible person over the age of 72 to stay with you for the first 24 hours after surgery.  ? ?

## 2021-10-05 ENCOUNTER — Ambulatory Visit (HOSPITAL_COMMUNITY): Admission: RE | Admit: 2021-10-05 | Payer: Medicare Other | Source: Home / Self Care | Admitting: Vascular Surgery

## 2021-10-05 HISTORY — DX: End stage renal disease: N18.6

## 2021-10-05 SURGERY — INSERTION OF ARTERIOVENOUS (AV) GORE-TEX GRAFT ARM
Anesthesia: Choice | Laterality: Left

## 2021-10-05 NOTE — Anesthesia Preprocedure Evaluation (Deleted)
Anesthesia Evaluation  ? ? ?Reviewed: ?Allergy & Precautions, Patient's Chart, lab work & pertinent test results ? ?History of Anesthesia Complications ?Negative for: history of anesthetic complications ? ?Airway ? ? ? ? ? ? ? Dental ?  ?Pulmonary ?COPD, former smoker,  ?  ? ? ? ? ? ? ? Cardiovascular ?hypertension, Pt. on medications and Pt. on home beta blockers ?+ CAD, + Past MI and + CABG  ? ? ? ?'22 TTE - EF 62%. Grade II diastolic dysfunction. Mild MR. Left atrial chamber is severely enlarged with a left atrial volume index of 49.53 ml/m^2 by BP MOD. Mild to moderate tricuspid valve regurgitation. Estimated pulmonary arterial systolic pressure is 53 mmHg. Large pleural effusion present.   ? ? ?  ?Neuro/Psych ?negative neurological ROS ? negative psych ROS  ? GI/Hepatic ?negative GI ROS, Neg liver ROS,   ?Endo/Other  ?diabetes, Type 2, Insulin Dependent, Oral Hypoglycemic Agents ? Renal/GU ?ESRFRenal disease  ? ?  ?Musculoskeletal ?negative musculoskeletal ROS ?(+)  ? Abdominal ?  ?Peds ? Hematology ?negative hematology ROS ?(+)   ?Anesthesia Other Findings ? ? Reproductive/Obstetrics ? ?  ? ? ? ? ? ? ? ? ? ? ? ? ? ?  ?  ? ? ? ? ? ? ? ? ?Anesthesia Physical ?Anesthesia Plan ? ?ASA: 3 ? ?Anesthesia Plan: MAC  ? ?Post-op Pain Management: Tylenol PO (pre-op)*  ? ?Induction:  ? ?PONV Risk Score and Plan: 1 and Propofol infusion and Treatment may vary due to age or medical condition ? ?Airway Management Planned: Natural Airway and Simple Face Mask ? ?Additional Equipment: None ? ?Intra-op Plan:  ? ?Post-operative Plan:  ? ?Informed Consent:  ? ?Plan Discussed with: CRNA and Anesthesiologist ? ?Anesthesia Plan Comments:   ? ? ? ? ? ?Anesthesia Quick Evaluation ? ?

## 2021-10-05 NOTE — Progress Notes (Signed)
Shawn Collins left a voice message saying he will not be able to make his surgery appointment. I notified the charge nurse. ?

## 2021-12-02 ENCOUNTER — Other Ambulatory Visit: Payer: Self-pay

## 2021-12-02 DIAGNOSIS — N186 End stage renal disease: Secondary | ICD-10-CM

## 2021-12-18 ENCOUNTER — Inpatient Hospital Stay (HOSPITAL_COMMUNITY)
Admission: EM | Admit: 2021-12-18 | Discharge: 2021-12-28 | DRG: 981 | Disposition: A | Discharge status: Skilled Nursing Facility | Payer: Medicare Other | Attending: Neurology | Admitting: Neurology

## 2021-12-18 ENCOUNTER — Other Ambulatory Visit: Payer: Self-pay

## 2021-12-18 ENCOUNTER — Emergency Department (HOSPITAL_COMMUNITY): Payer: Medicare Other

## 2021-12-18 ENCOUNTER — Encounter (HOSPITAL_COMMUNITY): Payer: Self-pay

## 2021-12-18 DIAGNOSIS — J439 Emphysema, unspecified: Secondary | ICD-10-CM | POA: Diagnosis present

## 2021-12-18 DIAGNOSIS — R260 Ataxic gait: Secondary | ICD-10-CM | POA: Diagnosis present

## 2021-12-18 DIAGNOSIS — I63541 Cerebral infarction due to unspecified occlusion or stenosis of right cerebellar artery: Secondary | ICD-10-CM | POA: Diagnosis not present

## 2021-12-18 DIAGNOSIS — I674 Hypertensive encephalopathy: Secondary | ICD-10-CM | POA: Diagnosis present

## 2021-12-18 DIAGNOSIS — I639 Cerebral infarction, unspecified: Secondary | ICD-10-CM | POA: Diagnosis present

## 2021-12-18 DIAGNOSIS — Z7982 Long term (current) use of aspirin: Secondary | ICD-10-CM

## 2021-12-18 DIAGNOSIS — R29810 Facial weakness: Secondary | ICD-10-CM | POA: Diagnosis present

## 2021-12-18 DIAGNOSIS — R4701 Aphasia: Secondary | ICD-10-CM | POA: Diagnosis present

## 2021-12-18 DIAGNOSIS — E1122 Type 2 diabetes mellitus with diabetic chronic kidney disease: Secondary | ICD-10-CM | POA: Diagnosis present

## 2021-12-18 DIAGNOSIS — I160 Hypertensive urgency: Secondary | ICD-10-CM | POA: Diagnosis present

## 2021-12-18 DIAGNOSIS — I2510 Atherosclerotic heart disease of native coronary artery without angina pectoris: Secondary | ICD-10-CM | POA: Diagnosis present

## 2021-12-18 DIAGNOSIS — N186 End stage renal disease: Secondary | ICD-10-CM | POA: Diagnosis present

## 2021-12-18 DIAGNOSIS — I252 Old myocardial infarction: Secondary | ICD-10-CM

## 2021-12-18 DIAGNOSIS — R636 Underweight: Secondary | ICD-10-CM | POA: Diagnosis present

## 2021-12-18 DIAGNOSIS — Z20822 Contact with and (suspected) exposure to covid-19: Secondary | ICD-10-CM | POA: Diagnosis present

## 2021-12-18 DIAGNOSIS — E871 Hypo-osmolality and hyponatremia: Secondary | ICD-10-CM | POA: Diagnosis not present

## 2021-12-18 DIAGNOSIS — Z992 Dependence on renal dialysis: Secondary | ICD-10-CM | POA: Diagnosis not present

## 2021-12-18 DIAGNOSIS — G8191 Hemiplegia, unspecified affecting right dominant side: Secondary | ICD-10-CM | POA: Diagnosis present

## 2021-12-18 DIAGNOSIS — E785 Hyperlipidemia, unspecified: Secondary | ICD-10-CM | POA: Diagnosis present

## 2021-12-18 DIAGNOSIS — K5900 Constipation, unspecified: Secondary | ICD-10-CM | POA: Diagnosis not present

## 2021-12-18 DIAGNOSIS — R112 Nausea with vomiting, unspecified: Secondary | ICD-10-CM | POA: Diagnosis present

## 2021-12-18 DIAGNOSIS — I120 Hypertensive chronic kidney disease with stage 5 chronic kidney disease or end stage renal disease: Secondary | ICD-10-CM | POA: Diagnosis present

## 2021-12-18 DIAGNOSIS — N2581 Secondary hyperparathyroidism of renal origin: Secondary | ICD-10-CM | POA: Diagnosis present

## 2021-12-18 DIAGNOSIS — N185 Chronic kidney disease, stage 5: Secondary | ICD-10-CM | POA: Diagnosis not present

## 2021-12-18 DIAGNOSIS — Z79899 Other long term (current) drug therapy: Secondary | ICD-10-CM

## 2021-12-18 DIAGNOSIS — I6389 Other cerebral infarction: Secondary | ICD-10-CM | POA: Diagnosis not present

## 2021-12-18 DIAGNOSIS — D631 Anemia in chronic kidney disease: Secondary | ICD-10-CM | POA: Diagnosis present

## 2021-12-18 DIAGNOSIS — G8194 Hemiplegia, unspecified affecting left nondominant side: Secondary | ICD-10-CM | POA: Diagnosis present

## 2021-12-18 DIAGNOSIS — I6381 Other cerebral infarction due to occlusion or stenosis of small artery: Secondary | ICD-10-CM | POA: Diagnosis present

## 2021-12-18 DIAGNOSIS — E7800 Pure hypercholesterolemia, unspecified: Secondary | ICD-10-CM | POA: Diagnosis not present

## 2021-12-18 DIAGNOSIS — M898X9 Other specified disorders of bone, unspecified site: Secondary | ICD-10-CM | POA: Diagnosis present

## 2021-12-18 DIAGNOSIS — I6350 Cerebral infarction due to unspecified occlusion or stenosis of unspecified cerebral artery: Secondary | ICD-10-CM | POA: Diagnosis not present

## 2021-12-18 DIAGNOSIS — R066 Hiccough: Secondary | ICD-10-CM | POA: Diagnosis present

## 2021-12-18 DIAGNOSIS — Z823 Family history of stroke: Secondary | ICD-10-CM

## 2021-12-18 DIAGNOSIS — R29705 NIHSS score 5: Secondary | ICD-10-CM | POA: Diagnosis present

## 2021-12-18 DIAGNOSIS — Z681 Body mass index (BMI) 19 or less, adult: Secondary | ICD-10-CM

## 2021-12-18 DIAGNOSIS — Z951 Presence of aortocoronary bypass graft: Secondary | ICD-10-CM

## 2021-12-18 DIAGNOSIS — Z8249 Family history of ischemic heart disease and other diseases of the circulatory system: Secondary | ICD-10-CM

## 2021-12-18 DIAGNOSIS — Z833 Family history of diabetes mellitus: Secondary | ICD-10-CM

## 2021-12-18 DIAGNOSIS — E1159 Type 2 diabetes mellitus with other circulatory complications: Secondary | ICD-10-CM | POA: Diagnosis not present

## 2021-12-18 DIAGNOSIS — Z87891 Personal history of nicotine dependence: Secondary | ICD-10-CM

## 2021-12-18 DIAGNOSIS — Z794 Long term (current) use of insulin: Secondary | ICD-10-CM

## 2021-12-18 DIAGNOSIS — H5509 Other forms of nystagmus: Secondary | ICD-10-CM | POA: Diagnosis present

## 2021-12-18 DIAGNOSIS — R471 Dysarthria and anarthria: Secondary | ICD-10-CM | POA: Diagnosis present

## 2021-12-18 HISTORY — DX: Cerebral infarction, unspecified: I63.9

## 2021-12-18 LAB — COMPREHENSIVE METABOLIC PANEL
ALT: 17 U/L (ref 0–44)
AST: 27 U/L (ref 15–41)
Albumin: 4.1 g/dL (ref 3.5–5.0)
Alkaline Phosphatase: 74 U/L (ref 38–126)
Anion gap: 16 — ABNORMAL HIGH (ref 5–15)
BUN: 25 mg/dL — ABNORMAL HIGH (ref 8–23)
CO2: 22 mmol/L (ref 22–32)
Calcium: 8.7 mg/dL — ABNORMAL LOW (ref 8.9–10.3)
Chloride: 102 mmol/L (ref 98–111)
Creatinine, Ser: 7.57 mg/dL — ABNORMAL HIGH (ref 0.61–1.24)
GFR, Estimated: 7 mL/min — ABNORMAL LOW (ref >60)
Glucose, Bld: 133 mg/dL — ABNORMAL HIGH (ref 70–99)
Potassium: 4 mmol/L (ref 3.5–5.1)
Sodium: 140 mmol/L (ref 135–145)
Total Bilirubin: 0.6 mg/dL (ref 0.3–1.2)
Total Protein: 8.2 g/dL — ABNORMAL HIGH (ref 6.5–8.1)

## 2021-12-18 LAB — CBC WITH DIFFERENTIAL/PLATELET
Abs Immature Granulocytes: 0.02 10*3/uL (ref 0.00–0.07)
Basophils Absolute: 0.1 10*3/uL (ref 0.0–0.1)
Basophils Relative: 1 %
Eosinophils Absolute: 0.1 10*3/uL (ref 0.0–0.5)
Eosinophils Relative: 1 %
HCT: 43.9 % (ref 39.0–52.0)
Hemoglobin: 14.6 g/dL (ref 13.0–17.0)
Immature Granulocytes: 0 %
Lymphocytes Relative: 18 %
Lymphs Abs: 1.2 10*3/uL (ref 0.7–4.0)
MCH: 32 pg (ref 26.0–34.0)
MCHC: 33.3 g/dL (ref 30.0–36.0)
MCV: 96.3 fL (ref 80.0–100.0)
Monocytes Absolute: 0.5 10*3/uL (ref 0.1–1.0)
Monocytes Relative: 8 %
Neutro Abs: 4.9 10*3/uL (ref 1.7–7.7)
Neutrophils Relative %: 72 %
Platelets: 236 10*3/uL (ref 150–400)
RBC: 4.56 MIL/uL (ref 4.22–5.81)
RDW: 12.9 % (ref 11.5–15.5)
WBC: 6.8 10*3/uL (ref 4.0–10.5)
nRBC: 0 % (ref 0.0–0.2)

## 2021-12-18 LAB — I-STAT CHEM 8, ED
BUN: 27 mg/dL — ABNORMAL HIGH (ref 8–23)
Calcium, Ion: 0.96 mmol/L — ABNORMAL LOW (ref 1.15–1.40)
Chloride: 103 mmol/L (ref 98–111)
Creatinine, Ser: 8.2 mg/dL — ABNORMAL HIGH (ref 0.61–1.24)
Glucose, Bld: 136 mg/dL — ABNORMAL HIGH (ref 70–99)
HCT: 47 % (ref 39.0–52.0)
Hemoglobin: 16 g/dL (ref 13.0–17.0)
Potassium: 4 mmol/L (ref 3.5–5.1)
Sodium: 140 mmol/L (ref 135–145)
TCO2: 22 mmol/L (ref 22–32)

## 2021-12-18 LAB — APTT: aPTT: 28 seconds (ref 24–36)

## 2021-12-18 LAB — HEMOGLOBIN A1C
Hgb A1c MFr Bld: 5.2 % (ref 4.8–5.6)
Mean Plasma Glucose: 102.54 mg/dL

## 2021-12-18 LAB — CBG MONITORING, ED
Glucose-Capillary: 119 mg/dL — ABNORMAL HIGH (ref 70–99)
Glucose-Capillary: 124 mg/dL — ABNORMAL HIGH (ref 70–99)
Glucose-Capillary: 152 mg/dL — ABNORMAL HIGH (ref 70–99)

## 2021-12-18 LAB — ETHANOL: Alcohol, Ethyl (B): <10 mg/dL (ref <10)

## 2021-12-18 LAB — TROPONIN I (HIGH SENSITIVITY)
Troponin I (High Sensitivity): 18 ng/L — ABNORMAL HIGH (ref <18)
Troponin I (High Sensitivity): 18 ng/L — ABNORMAL HIGH (ref <18)

## 2021-12-18 LAB — RESP PANEL BY RT-PCR (FLU A&B, COVID) ARPGX2
Influenza A by PCR: NEGATIVE
Influenza B by PCR: NEGATIVE
SARS Coronavirus 2 by RT PCR: NEGATIVE

## 2021-12-18 LAB — PROTIME-INR
INR: 1.1 (ref 0.8–1.2)
Prothrombin Time: 14.2 seconds (ref 11.4–15.2)

## 2021-12-18 MED ORDER — PANTOPRAZOLE SODIUM 40 MG IV SOLR
40.0000 mg | Freq: Every day | INTRAVENOUS | Status: DC
  Administered 2021-12-18 – 2021-12-19 (×2): 40 mg via INTRAVENOUS
  Filled 2021-12-18 (×2): qty 10

## 2021-12-18 MED ORDER — SODIUM CHLORIDE 0.9 % IV SOLN: INTRAVENOUS | Status: DC

## 2021-12-18 MED ORDER — IOHEXOL 350 MG/ML SOLN
75.0000 mL | Freq: Once | INTRAVENOUS | Status: AC | PRN
  Administered 2021-12-18: 75 mL via INTRAVENOUS

## 2021-12-18 MED ORDER — LABETALOL HCL 5 MG/ML IV SOLN
5.0000 mg | INTRAVENOUS | Status: DC | PRN
Start: 2021-12-18 — End: 2021-12-20
  Administered 2021-12-19: 5 mg via INTRAVENOUS
  Filled 2021-12-18 (×2): qty 4

## 2021-12-18 MED ORDER — ACETAMINOPHEN 325 MG PO TABS
650.0000 mg | ORAL_TABLET | ORAL | Status: DC | PRN
  Administered 2021-12-22: 650 mg via ORAL
  Filled 2021-12-18: qty 2

## 2021-12-18 MED ORDER — SENNOSIDES-DOCUSATE SODIUM 8.6-50 MG PO TABS: 1.0000 | ORAL_TABLET | Freq: Every evening | ORAL | Status: DC | PRN

## 2021-12-18 MED ORDER — ONDANSETRON HCL 4 MG/2ML IJ SOLN
4.0000 mg | Freq: Once | INTRAMUSCULAR | Status: AC
  Administered 2021-12-18: 4 mg via INTRAVENOUS
  Filled 2021-12-18: qty 2

## 2021-12-18 MED ORDER — ACETAMINOPHEN 650 MG RE SUPP: 650.0000 mg | RECTAL | Status: DC | PRN

## 2021-12-18 MED ORDER — CLEVIDIPINE BUTYRATE 0.5 MG/ML IV EMUL
0.0000 mg/h | INTRAVENOUS | Status: DC
  Administered 2021-12-18: 2 mg/h via INTRAVENOUS
  Filled 2021-12-18: qty 50

## 2021-12-18 MED ORDER — CLEVIDIPINE BUTYRATE 0.5 MG/ML IV EMUL: 0.0000 mg/h | INTRAVENOUS | Status: DC

## 2021-12-18 MED ORDER — INSULIN ASPART 100 UNIT/ML IJ SOLN
0.0000 [IU] | INTRAMUSCULAR | Status: DC
  Administered 2021-12-19 – 2021-12-20 (×3): 1 [IU] via SUBCUTANEOUS

## 2021-12-18 MED ORDER — HYDRALAZINE HCL 20 MG/ML IJ SOLN
10.0000 mg | Freq: Once | INTRAMUSCULAR | Status: AC
  Administered 2021-12-18: 10 mg via INTRAVENOUS

## 2021-12-18 MED ORDER — LORAZEPAM 2 MG/ML IJ SOLN
1.0000 mg | Freq: Once | INTRAMUSCULAR | Status: AC
  Administered 2021-12-18: 1 mg via INTRAVENOUS
  Filled 2021-12-18: qty 1

## 2021-12-18 MED ORDER — ACETAMINOPHEN 160 MG/5ML PO SOLN: 650.0000 mg | ORAL | Status: DC | PRN

## 2021-12-18 MED ORDER — STROKE: EARLY STAGES OF RECOVERY BOOK
Freq: Once | Status: AC
  Filled 2021-12-18: qty 1

## 2021-12-18 MED ORDER — HYDRALAZINE HCL 20 MG/ML IJ SOLN
5.0000 mg | Freq: Four times a day (QID) | INTRAMUSCULAR | Status: DC | PRN
  Administered 2021-12-19: 5 mg via INTRAVENOUS
  Filled 2021-12-18: qty 1

## 2021-12-18 MED ORDER — TENECTEPLASE FOR STROKE
0.2500 mg/kg | PACK | Freq: Once | INTRAVENOUS | Status: AC
Start: 2021-12-18 — End: 2021-12-18
  Administered 2021-12-18: 15 mg via INTRAVENOUS
  Filled 2021-12-18: qty 10

## 2021-12-18 MED ORDER — LORAZEPAM 2 MG/ML IJ SOLN
0.5000 mg | Freq: Four times a day (QID) | INTRAMUSCULAR | Status: DC | PRN
  Administered 2021-12-19: 0.5 mg via INTRAVENOUS
  Filled 2021-12-18: qty 1

## 2021-12-18 NOTE — Progress Notes (Signed)
Brief Neuro Update:  EKG with prolonged QT interval. Initially gave zofran 0.4mg . Will hold off on further 5TH3 antagonists. Will do 0.5mg  of Ativan PRN for Nausea/vomiting, another option is H1 blockers but my experience has been that they are less effective in central causes of Nausea compared to benzos. His Potassium is 4.0. Will check Magnesium and replace as needed. Will recommend repeat EKG tomorrow.  [place] Pager Number [phone]

## 2021-12-18 NOTE — H&P (Signed)
Neurology H&P  CC: Sudden onset nausea and vomiting  History is obtained from: Patient, chart review and ED provider  HPI: Shawn Collins is a 71 y.o. male with a past medical history significant for ESRD on IHD, hypertension, hyperlipidemia, diabetes, coronary artery disease s/p CABG.  He was at home in his usual state of health today when he had sudden onset nausea and vomiting while he was walking through his home.  He waited about 30 or 40 minutes to see if his symptoms subsided prior to calling EMS.  They noted his blood pressures were extremely elevated on their arrival in the 220s, glucose in the low 100s.  After Dr. Acquanetta Chain evaluation, case was discussed with me and we activated a code stroke  LKW: 15:00 on 7/22 tPA given?:  Yes at 1849  Premorbid modified rankin scale: 0-1     0 - No symptoms.     1 - No significant disability. Able to carry out all usual activities, despite some symptoms.  Slight delay as I attempted to call family to confirm medical history given the patient's slight sleepiness, as well as for blood pressure control.  Ultimately patient was felt to be self-consentable for TNK, as he was providing consistent history to different providers, risks and benefits were reviewed with him directly, and his chart was double checked for any contraindications listed in the EMR.   ROS: Limited ROS reviewed for any contraindications to TNK per standard checklist  Past Medical History:  Diagnosis Date   CAD (coronary artery disease), native coronary artery    s/p NSTEMI with cath showing severe 3VD s/p  CABG x 3 with LIMA to LAD, SVG to OM1/OM2 and SVG D1 by Dr. Servando Snare on 10/26/16.   CKD (chronic kidney disease) stage 3, GFR 30-59 ml/min (HCC) 09/2016   COPD (chronic obstructive pulmonary disease) ([place]) 09/10/2014   Diabetes mellitus without complication (HCC)    Type II   Dizzy-->?Orthostatic? 09/10/2014   Emphysema lung ([place])    ESRD (end stage renal disease) ([place])     Hyperlipidemia LDL goal <70 04/17/2017   Hypertension    Hypoxia 09/10/2014   Past Surgical History:  Procedure Laterality Date   CORONARY ARTERY BYPASS GRAFT N/A 10/26/2016   Procedure: CORONARY ARTERY BYPASS GRAFTING (CABG) x 4 USING LEFT INTERNAL MAMMARY ARTERY TO LAD AND ENDOSCOPICALLY HARVESTED GREATER SAPHENOUS VEIN TO OM 1, 2 AND TO DIAG;  Surgeon: Grace Isaac, MD;  Location: [facility];  Service: Open Heart Surgery;  Laterality: N/A;   ENDOVEIN HARVEST OF GREATER SAPHENOUS VEIN Right 10/26/2016   Procedure: ENDOVEIN HARVEST OF GREATER SAPHENOUS VEIN;  Surgeon: Grace Isaac, MD;  Location: [facility];  Service: Open Heart Surgery;  Laterality: Right;   IR THORACENTESIS ASP PLEURAL SPACE W/IMG GUIDE  11/03/2016   LEFT HEART CATH AND CORONARY ANGIOGRAPHY N/A 10/21/2016   Procedure: Left Heart Cath and Coronary Angiography;  Surgeon: Martinique, Peter M, MD;  Location: [facility];  Service: Cardiovascular;  Laterality: N/A;   TEE WITHOUT CARDIOVERSION N/A 10/26/2016   Procedure: TRANSESOPHAGEAL ECHOCARDIOGRAM (TEE);  Surgeon: Grace Isaac, MD;  Location: [facility];  Service: Open Heart Surgery;  Laterality: N/A;    Current Facility-Administered Medications:    [START ON 12/19/2021]  stroke: early stages of recovery book, , Does not apply, Once, Eligah Anello L, MD   0.9 %  sodium chloride infusion, , Intravenous, Continuous, Milferd Ansell L, MD   acetaminophen (TYLENOL) tablet 650 mg, 650 mg, Oral,  Q4H PRN **OR** acetaminophen (TYLENOL) 160 MG/5ML solution 650 mg, 650 mg, Per Tube, Q4H PRN **OR** acetaminophen (TYLENOL) suppository 650 mg, 650 mg, Rectal, Q4H PRN, Helon Wisinski L, MD   clevidipine (CLEVIPREX) infusion 0.5 mg/mL, 0-21 mg/hr, Intravenous, Continuous, Jalien Weakland L, MD, Last Rate: 6 mL/hr at 12/18/21 1912, 3 mg/hr at 12/18/21 1912   insulin aspart (novoLOG) injection 0-6 Units, 0-6 Units, Subcutaneous, Q4H, Charleigh Correnti L, MD   pantoprazole (PROTONIX) injection  40 mg, 40 mg, Intravenous, QHS, Mikenna Bunkley L, MD   senna-docusate (Senokot-S) tablet 1 tablet, 1 tablet, Oral, QHS PRN, Claudell Rhody L, MD  Current Outpatient Medications:    amLODipine (NORVASC) 10 MG tablet, Take 1 tablet (10 mg total) by mouth daily., Disp: 30 tablet, Rfl: 0   aspirin EC 81 MG tablet, Take 1 tablet (81 mg total) by mouth daily., Disp: 90 tablet, Rfl: 3   atorvastatin (LIPITOR) 20 MG tablet, Take 20 mg by mouth daily., Disp: , Rfl:    carvedilol (COREG) 6.25 MG tablet, Take 6.25 mg by mouth 2 (two) times daily., Disp: , Rfl:    furosemide (LASIX) 40 MG tablet, Take 40 mg by mouth daily., Disp: , Rfl:    hydrALAZINE (APRESOLINE) 25 MG tablet, Take 25 mg by mouth 3 (three) times daily., Disp: , Rfl:    insulin glargine, 2 Unit Dial, (TOUJEO MAX SOLOSTAR) 300 UNIT/ML Solostar Pen, Inject 10 Units into the skin at bedtime., Disp: , Rfl:    losartan (COZAAR) 25 MG tablet, Take 25 mg by mouth daily., Disp: , Rfl:    nitroGLYCERIN (NITROSTAT) 0.4 MG SL tablet, Place 0.4 mg under the tongue every 5 (five) minutes as needed for chest pain., Disp: , Rfl:    VELPHORO 500 MG chewable tablet, Chew 500 mg by mouth 3 (three) times daily., Disp: , Rfl:    Family History  Problem Relation Age of Onset   Diabetes Mellitus II Mother    Stroke Father    Hypertension Father    Diabetes Mellitus II Sister     Social History:  reports that he quit smoking about 51 years ago. His smoking use included cigarettes. He has a 2.00 pack-year smoking history. He has never used smokeless tobacco. He reports that he does not drink alcohol and does not use drugs.   Exam: Current vital signs: Ht 5\' 8"  (1.727 m)   Wt 59.6 kg   BMI 19.99 kg/m  Vital signs in last 24 hours: Weight:  [59 kg-59.6 kg] 59.6 kg (07/22 1800)   Physical Exam  Constitutional: Appears thin but well-developed, intermittently vomiting/dry heaving throughout my evaluation Psych: Affect calm Eyes: No scleral  injection HENT: No oropharyngeal obstruction.  MSK: no joint deformities.  Cardiovascular: Perfusing extremities well Respiratory: Effort normal, non-labored breathing GI: Soft.  No distension. There is no tenderness.  Skin: Warm dry and intact visible skin  Neuro: Mental Status: Patient is awake, alert, oriented to person, place, month, year, and situation. Patient is able to give a clear and coherent history. No neglect, no severe aphasia but could not name lenses for example Cranial Nerves: II: Visual Fields are full. Pupils are equal, round, and reactive to light.   III,IV, VI: EOM notable for intermittent direction changing nystagmus and intermittent internuclear ophthalmoplegia (left eye not moving medially on right gaze) V: Facial sensation is symmetric to touch VII: Facial movement is notable for right facial droop.  VIII: hearing is intact to voice X: Uvula elevates symmetrically XI: Shoulder shrug  is symmetric. XII: tongue is midline without atrophy or fasciculations.  Motor: Initially had drift in the bilateral upper extremities, post TNK it did not have any drift.  Initially had drift of bilateral lower extremities to the bed, post TNK could maintain them antigravity Sensory: Sensation is symmetric to light touch in the arms and legs without extinction to double simultaneous stimuli Cerebellar: FNF and HKS are intact within limits of weakness initially, but subsequently out of proportion in the left leg and right upper extremity Gait:  Deferred in acute setting   NIHSS total 10 Score breakdown: One-point for drowsiness, one-point for right facial droop, one-point for left arm weakness, one-point for right arm weakness, 2 points for left leg weakness, 2 points for right leg weakness, one-point for mild aphasia, one-point for mild dysarthria Performed at 1822   I have reviewed labs in epic and the results pertinent to this consultation are:  Basic Metabolic  Panel: Recent Labs  Lab 12/18/21 1811 12/18/21 1822  NA 140 140  K 4.0 4.0  CL 102 103  CO2 22  --   GLUCOSE 133* 136*  BUN 25* 27*  CREATININE 7.57* 8.20*  CALCIUM 8.7*  --     CBC: Recent Labs  Lab 12/18/21 1811 12/18/21 1822  WBC 6.8  --   NEUTROABS 4.9  --   HGB 14.6 16.0  HCT 43.9 47.0  MCV 96.3  --   PLT 236  --     Coagulation Studies: Recent Labs    12/18/21 1810  LABPROT 14.2  INR 1.1      I have reviewed the images obtained:  Head CT personally reviewed, there is significant chronic microvascular disease but no clear acute process On radiology review there was an age-indeterminate lacunar infarct within the posterior limb of the left internal capsule with an ASPECTS of 9; given patient had symptoms of posterior circulation stroke this was not felt to be clinically relevant  CTA head and neck personally reviewed, agree with radiology  CTA neck: 1. 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 2. The right vertebral artery is patent. Mild-to-moderate atherosclerotic narrowing at the origin of this vessel. 3. The common carotid and internal carotid arteries are patent within the neck without hemodynamically significant stenosis. Mild atherosclerotic plaque about the right carotid bifurcation and within the proximal right ICA. 4. Cervical spondylosis.   CTA head: 1. Only intermittent enhancement is seen within the intracranial left vertebral artery and this vessel is likely functionally occluded. At least some enhancement is seen within the proximal left PICA. 2. Additional intracranial atherosclerotic disease with multifocal stenoses, as outlined and most notably as follows. Moderate stenosis within the supraclinoid right ICA. Moderate stenosis within an inferior division proximal M2 right MCA vessel.  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  Recommendations: #Brainstem stroke based on clinical assessment stroke - Stroke labs HgbA1c, fasting lipid panel - MRI brain 24 hours post TNK - Frequent neuro checks - Echocardiogram - Hold antiplatelets until at least 24 hours post TNK with clinical and radiographic stability demonstrated - Risk factor modification - Telemetry monitoring;  - Blood pressure goal   - Post tPA for 24  hours < 180/105  - Cleviprex been  ordered, labetalol if heart rate greater than 70 and maxed on the Cleviprex, hydralazine if heart rate less than 70 and maxed on Cleviprex  - PT consult, OT consult, Speech consult, unless patient is back to baseline - Stroke team to follow  # DM -Very low-dose sliding scale insulin given ESRD  #ESRD on iHD -Nephrology to be consulted 7/20 3 AM  #CAD #CHF -Hold antiplatelets until patient clinically stable at least 24 hours post TNK -Hold Lasix for now given patient's significant nausea and vomiting -Caution with IV fluids, but will give 50 cc/h given contrast load and significant nausea and vomiting -Follow-up TTE  Lesleigh Noe MD-PhD [facility] [phone] Available 7 AM to 7 PM, outside these hours please contact Neurologist on call listed on AMION    Total critical care time: 60 minutes   Critical care time was exclusive of separately billable procedures and treating other patients.   Critical care was necessary to treat or prevent imminent or life-threatening deterioration.   Critical care was time spent personally by me on the following activities: development of treatment plan with patient and/or surrogate as well as nursing, discussions with consultants/primary team,  evaluation of patient's response to treatment, examination of patient, obtaining history from patient or surrogate, ordering and performing treatments and interventions, ordering and review of laboratory studies, ordering and review of radiographic studies, and re-evaluation of patient's condition as needed, as documented above.

## 2021-12-18 NOTE — ED Triage Notes (Signed)
Pt BIBA from home for N/V. Pt was diaphoretic on scene. Dialysis pt. M/W/F. Completed treatment yesterday. Denies CP and SOB.   BP- 210/100 CBG 116  4mg  Zofran given by EMS.

## 2021-12-18 NOTE — Progress Notes (Signed)
PHARMACIST CODE STROKE RESPONSE  Notified to mix TNK at 1846 by Dr. Curly Shores TNK preparation completed at 1847  TNK dose = 15 mg IV over 5 seconds.   Issues/delays encountered (if applicable): BP control requiring cleviprex gtt, pt Wt, consent  Shawn Collins 12/18/21 6:49 PM

## 2021-12-18 NOTE — ED Notes (Signed)
Pt in room talking to Neurologist.Pt vomiting clear thick fluids. Pt able to follow commands but reports things are upside in the room. Pt denies having double vision, SOB, or CP.

## 2021-12-18 NOTE — ED Provider Notes (Signed)
[facility] Provider Note   CSN: [id number] Arrival date & time: 12/18/21  1745     History  Chief Complaint  Patient presents with   Hypertension    Shawn Collins is a 71 y.o. male.  Patient is a 71 year old male with a history of diabetes, hypertension, COPD, end-stage renal disease who started on dialysis 2 months ago who currently has a tunneling catheter in the right chest, prior CAD status post CABG x3 who is presenting today with EMS for sudden onset of nausea vomiting.  Patient reports throughout the day today he was feeling fine until 30 minutes before arrival when he suddenly developed nausea vomiting and reports his vision is upside down and stacked on top of each other.  He denies chest pain, shortness of breath.  He has no abdominal pain.  He denies any headache.  Patient is vomiting repetitively on exam and does not give much history.  EMS reports his blood sugar was within normal limits, pressure has been significantly elevated they did place an IV and give 4 mg of Zofran without improvement.  Patient did have a unrevealing twelve-lead EKG.  The history is provided by the patient and the EMS personnel.  Hypertension       Home Medications Prior to Admission medications   Medication Sig Start Date End Date Taking? Authorizing Provider  amLODipine (NORVASC) 10 MG tablet Take 1 tablet (10 mg total) by mouth daily. 09/12/14   Reyne Dumas, MD  aspirin EC 81 MG tablet Take 1 tablet (81 mg total) by mouth daily. 11/22/17   Sueanne Margarita, MD  atorvastatin (LIPITOR) 20 MG tablet Take 20 mg by mouth daily.    [provider]  carvedilol (COREG) 6.25 MG tablet Take 6.25 mg by mouth 2 (two) times daily. 07/19/21   [provider]  furosemide (LASIX) 40 MG tablet Take 40 mg by mouth daily.    [provider]  hydrALAZINE (APRESOLINE) 25 MG tablet Take 25 mg by mouth 3 (three) times daily.    [provider]  insulin glargine, 2 Unit Dial, (TOUJEO MAX SOLOSTAR) 300 UNIT/ML Solostar Pen Inject 10 Units into the skin at bedtime.    [provider]  losartan (COZAAR) 25 MG tablet Take 25 mg by mouth daily. 08/11/21   [provider]  nitroGLYCERIN (NITROSTAT) 0.4 MG SL tablet Place 0.4 mg under the tongue every 5 (five) minutes as needed for chest pain.    [provider]  VELPHORO 500 MG chewable tablet Chew 500 mg by mouth 3 (three) times daily. 06/04/21   [provider]      Allergies    Patient has no known allergies.    Review of Systems   Review of Systems  Physical Exam Updated Vital Signs BP 135/66   Pulse 70   Resp 19   Ht 5\' 8"  (1.727 m)   Wt 59.6 kg   SpO2 100%   BMI 19.99 kg/m  Physical Exam Vitals and nursing note reviewed.  Constitutional:      General: He is in acute distress.     Appearance: He is well-developed. He is diaphoretic.     Comments: Pale, diaphoretic and repetitively vomiting  HENT:     Head: Normocephalic and atraumatic.  Eyes:     Conjunctiva/sclera: Conjunctivae normal.     Pupils: Pupils are equal, round, and reactive to light.     Comments: Pupils are reactive but patient will not  do testing to evaluate for extraocular movements.  With eyes not moving there is no notable nystagmus.  Cardiovascular:     Rate and Rhythm: Normal rate and regular rhythm.     Heart sounds: No murmur heard. Pulmonary:     Effort: Pulmonary effort is normal. No respiratory distress.     Breath sounds: Normal breath sounds. No wheezing or rales.     Comments: Tachypnea Abdominal:     General: There is no distension.     Palpations: Abdomen is soft.     Tenderness: There is no abdominal tenderness. There is no guarding or rebound.  Musculoskeletal:        General: No tenderness. Normal range of motion.     Cervical back: Normal range of motion and neck supple.  Skin:    General: Skin is warm.     Findings: No erythema or rash.   Neurological:     Mental Status: He is alert.     Comments: Patient is awake and alert and is noted to move all 4 extremities with no notable weakness in the upper or lower extremities.  No notable pronator drift.  However patient will not open his eyes and will not participate in finger-nose testing or heel-to-shin testing     ED Results / Procedures / Treatments   Labs (all labs ordered are listed, but only abnormal results are displayed) Labs Reviewed  COMPREHENSIVE METABOLIC PANEL - Abnormal; Notable for the following components:      Result Value   Glucose, Bld 133 (*)    BUN 25 (*)    Creatinine, Ser 7.57 (*)    Calcium 8.7 (*)    Total Protein 8.2 (*)    GFR, Estimated 7 (*)    Anion gap 16 (*)    All other components within normal limits  I-STAT CHEM 8, ED - Abnormal; Notable for the following components:   BUN 27 (*)    Creatinine, Ser 8.20 (*)    Glucose, Bld 136 (*)    Calcium, Ion 0.96 (*)    All other components within normal limits  CBG MONITORING, ED - Abnormal; Notable for the following components:   Glucose-Capillary 119 (*)    All other components within normal limits  TROPONIN I (HIGH SENSITIVITY) - Abnormal; Notable for the following components:   Troponin I (High Sensitivity) 18 (*)    All other components within normal limits  RESP PANEL BY RT-PCR (FLU A&B, COVID) ARPGX2  CBC WITH DIFFERENTIAL/PLATELET  PROTIME-INR  APTT  ETHANOL  RAPID URINE DRUG SCREEN, HOSP PERFORMED  URINALYSIS, ROUTINE W REFLEX MICROSCOPIC  TROPONIN I (HIGH SENSITIVITY)    EKG None ED ECG REPORT   Date: 12/18/2021  Rate: 68  Rhythm: normal sinus rhythm  QRS Axis: normal  Intervals: QT prolonged  ST/T Wave abnormalities: nonspecific T wave changes  Conduction Disutrbances:none  Narrative Interpretation:   Old EKG Reviewed:  twave abnormality improved from prior  I have personally reviewed the EKG tracing and agree with the computerized printout as  noted.   Radiology CT ANGIO HEAD NECK W WO CM (CODE STROKE)  Result Date: 12/18/2021 CLINICAL DATA:  Provided history: Neuro deficit, acute, stroke suspected. EXAM: CT ANGIOGRAPHY HEAD AND NECK TECHNIQUE: Multidetector CT imaging of the head and neck was performed using the standard protocol during bolus administration of intravenous contrast. Multiplanar CT image reconstructions and MIPs were obtained to evaluate the vascular anatomy. Carotid stenosis measurements (when applicable) are obtained utilizing NASCET criteria, using  the distal internal carotid diameter as the denominator. RADIATION DOSE REDUCTION: This exam was performed according to the departmental dose-optimization program which includes automated exposure control, adjustment of the mA and/or kV according to patient size and/or use of iterative reconstruction technique. CONTRAST:  35mL OMNIPAQUE IOHEXOL 350 MG/ML SOLN COMPARISON:  Noncontrast head CT performed earlier today 12/18/2021. FINDINGS: CTA NECK FINDINGS Aortic arch: Standard aortic branching. The visualized aortic arch is normal in caliber. Minimal atherosclerotic plaque within the proximal left subclavian artery. No hemodynamically significant innominate or proximal subclavian artery stenosis. No hemodynamically significant innominate or proximal subclavian artery stenosis. Right carotid system: CCA and ICA patent within the neck without hemodynamically significant stenosis (50% or greater). Mild atherosclerotic plaque within the proximal ICA. Left carotid system: CCA and ICA patent within the neck without stenosis or significant atherosclerotic disease. Vertebral arteries: The right vertebral artery is patent within the neck. Mild to moderate atherosclerotic narrowing at the origin of this vessel. There is also mild atherosclerotic narrowing at the level of the skull base. Only intermittent enhancement is seen within the left vertebral artery within the neck, and this vessel is  likely functionally occluded. Skeleton: Cervical spondylosis. No acute fracture or aggressive osseous lesion. Other neck: No neck mass or cervical lymphadenopathy. Upper chest: Prior median sternotomy. No consolidation within the imaged lung apices. Review of the MIP images confirms the above findings CTA HEAD FINDINGS Anterior circulation: The intracranial internal carotid arteries are patent. Atherosclerotic plaque within both vessels. Moderate stenosis within the supraclinoid right ICA. No more than mild stenosis elsewhere within the cranial ICAs. The M1 middle cerebral arteries are patent. Atherosclerotic irregularity of the M2 and more distal MCA vessels, bilaterally. Most notably, there is a moderate stenosis within an inferior division proximal right M2 MCA vessel (series 8, image 18). The anterior cerebral arteries are patent. Posterior circulation: The intracranial right vertebral artery is patent. Atherosclerotic plaque within this vessel with up to moderate stenosis. Only intermittent enhancement is seen within the intracranial left vertebral artery and this vessel is likely functionally occluded. At least some enhancement is seen within the proximal left PICA. The basilar artery is patent. The posterior cerebral arteries are patent. Hypoplastic left P1 segment with sizable left posterior communicating artery. A sizable right posterior communicating artery is also present. Moderate stenosis within the left P2 segment. Venous sinuses: Within the limitations of contrast timing, no convincing thrombus. Anatomic variants: As described. Review of the MIP images confirms the above findings These results were called by telephone at the time of interpretation on 12/18/2021 at 7:23 pm to provider [facility] , who verbally acknowledged these results. IMPRESSION: CTA neck: 1. 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 2. The  right vertebral artery is patent. Mild-to-moderate atherosclerotic narrowing at the origin of this vessel. 3. The common carotid and internal carotid arteries are patent within the neck without hemodynamically significant stenosis. Mild atherosclerotic plaque about the right carotid bifurcation and within the proximal right ICA. 4. Cervical spondylosis. CTA head: 1. Only intermittent enhancement is seen within the intracranial left vertebral artery and this vessel is likely functionally occluded. At least some enhancement is seen within the proximal left PICA. 2. Additional intracranial atherosclerotic disease with multifocal stenoses, as outlined and most notably as follows. Moderate stenosis within the supraclinoid right ICA. Moderate stenosis within an inferior division proximal M2 right MCA vessel. Electronically Signed   By: Kellie Simmering D.O.  On: 12/18/2021 19:25   CT HEAD CODE STROKE WO CONTRAST  Result Date: 12/18/2021 CLINICAL DATA:  Code stroke. Neuro deficit, acute, stroke suspected. EXAM: CT HEAD WITHOUT CONTRAST TECHNIQUE: Contiguous axial images were obtained from the base of the skull through the vertex without intravenous contrast. RADIATION DOSE REDUCTION: This exam was performed according to the departmental dose-optimization program which includes automated exposure control, adjustment of the mA and/or kV according to patient size and/or use of iterative reconstruction technique. COMPARISON:  No pertinent prior exams available for comparison. FINDINGS: Brain: Moderate generalized cerebral atrophy.  Mild cerebellar atrophy. Chronic appearing lacunar infarct within the right frontoparietal white matter (series 4, image 22). Age-indeterminate lacunar infarct within the posterior limb of left internal capsule (series 4, image 15). Chronic lacunar infarcts elsewhere within the left internal capsule and lentiform nucleus. Background mild-to-moderate patchy and ill-defined hypoattenuation within the  cerebral white matter, nonspecific but compatible with chronic small vessel disease. There is no acute intracranial hemorrhage. No demarcated cortical infarct. No extra-axial fluid collection. No evidence of an intracranial mass. No midline shift. Vascular: No hyperdense vessel.  Atherosclerotic calcifications. Skull: No fracture or aggressive osseous lesion. Sinuses/Orbits: No mass or acute finding within the imaged orbits. Minimal mucosal thickening scattered within the paranasal sinuses at the imaged levels. ASPECTS ([place] Stroke Program Early CT Score) - Ganglionic level infarction (caudate, lentiform nuclei, internal capsule, insula, M1-M3 cortex): 6 - Supraganglionic infarction (M4-M6 cortex): 3 Total score (0-10 with 10 being normal): 9 Age-indeterminate lacunar infarct within the posterior limb of left internal capsule. These results were communicated to Dr. Curly Shores At 6:34 pmon 7/22/2023by text page via the [facility] messaging system. IMPRESSION: Age-indeterminate lacunar infarct within the posterior limb of left internal capsule. ASPECTS is 9. Background parenchymal atrophy, chronic small vessel ischemic disease and chronic lacunar infarcts, as described. Electronically Signed   By: Kellie Simmering D.O.   On: 12/18/2021 18:34   DG Chest Port 1 View  Result Date: 12/18/2021 CLINICAL DATA:  Vomiting and hypertension. EXAM: PORTABLE CHEST 1 VIEW COMPARISON:  01/09/2017 FINDINGS: Postoperative changes in the mediastinum. Right central venous catheter with tip over the low SVC region. Heart size and pulmonary vascularity are normal. Lungs are clear. No pleural effusions. No pneumothorax. Mediastinal contours appear intact. IMPRESSION: No active disease. Electronically Signed   By: Lucienne Capers M.D.   On: 12/18/2021 18:19    Procedures Procedures    Medications Ordered in ED Medications  clevidipine (CLEVIPREX) infusion 0.5 mg/mL (3 mg/hr Intravenous Rate/Dose Change 12/18/21 1912)  ondansetron  (ZOFRAN) injection 4 mg (has no administration in time range)  LORazepam (ATIVAN) injection 1 mg (1 mg Intravenous Given 12/18/21 1758)  tenecteplase (TNKASE) injection for Stroke 15 mg (15 mg Intravenous Given 12/18/21 1845)  iohexol (OMNIPAQUE) 350 MG/ML injection 75 mL (75 mLs Intravenous Contrast Given 12/18/21 1859)  hydrALAZINE (APRESOLINE) injection 10 mg (10 mg Intravenous Given 12/18/21 1836)    ED Course/ Medical Decision Making/ A&P                           Medical Decision Making Amount and/or Complexity of Data Reviewed Independent Historian: EMS External Data Reviewed: notes.    Details: vascular notes Labs: ordered. Decision-making details documented in ED Course. Radiology: ordered and independent interpretation performed. Decision-making details documented in ED Course. ECG/medicine tests: ordered and independent interpretation performed. Decision-making details documented in ED Course.  Risk Prescription drug management. Decision regarding hospitalization.   Pt with  multiple medical problems and comorbidities and presenting today with a complaint that caries a high risk for morbidity and mortality.  Here today with sudden onset of nausea vomiting and complains of his vision being stacked on top of each other and upside down.  He denies any headaches.  He had no syncopal events or loss of consciousness.  He denies any chest pain, palpitations.  Patient is ill-appearing on exam and repetitively vomiting.  Exam is difficult due to patient being symptomatic and not fully cooperative.  However no pronator drift was noted or weakness.  Patient seems to be mentating okay.  Patient recently started on dialysis 2 months ago has not noted to be taking anticoagulation.  Patient is significantly hypertensive here at 240/150 and last normal dialysis was on Friday full course.  Concerned that this might be posterior circulation stroke given patient's complaints and findings.  Also concern  for press.  His breath sounds are clear and he is not displaying symptoms consistent with flash pulmonary edema.  Lower suspicion for MI.  I independently interpreted patient's EKG and he has a sinus rhythm of 68 with prolonged QT but otherwise no significant findings.  Because of patient's symptoms spoke with neurology Dr. Curly Shores and discussed symptoms.  Since pt last known normal was 51min prior to arrival and current sx will call a code stroke.  Pt also given ativan for symptoms as zofran was not helpful.  Neurology wanted to hold on cleveprex at this time.  7:34 PM I independently interpreted patient's labs and CBC within normal limits, CMP consistent with end-stage renal disease with creatinine of 7.57 but BUN of 25 and anion gap of 16, mild bump in the troponin of 18 which may be related to his end-stage renal disease and coags are within normal limits. I have independently visualized and interpreted pt's images today.  Head CT did not show evidence of intracranial hemorrhage.  Etiology reported age-indeterminate lacunar infarct within the posterior limb of the left internal capsule and chronic small vessel ischemia, chest x-ray without acute findings and dialysis catheter is present in the right upper chest.  Patient CTA showed evidence of vertebral occulsion and intermittent flow to PICA neurology felt that he was a candidate for TNKase due to his abrupt onset of symptoms and he was close to the 3-hour window.  Patient will be admitted to the neurology service.  After receiving Ativan it did seem to help his symptoms and his nausea and vomiting.  He initially had nystagmus when he was calmer with neurology but also had disconjugate gaze intermittently.  Pt admitted by neurology for further care.  CRITICAL CARE Performed by: Roniyah Llorens Total critical care time: 30 minutes Critical care time was exclusive of separately billable procedures and treating other patients. Critical care was necessary  to treat or prevent imminent or life-threatening deterioration. Critical care was time spent personally by me on the following activities: development of treatment plan with patient and/or surrogate as well as nursing, discussions with consultants, evaluation of patient's response to treatment, examination of patient, obtaining history from patient or surrogate, ordering and performing treatments and interventions, ordering and review of laboratory studies, ordering and review of radiographic studies, pulse oximetry and re-evaluation of patient's condition.         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

## 2021-12-19 ENCOUNTER — Inpatient Hospital Stay (HOSPITAL_COMMUNITY): Payer: Medicare Other

## 2021-12-19 DIAGNOSIS — I6389 Other cerebral infarction: Secondary | ICD-10-CM

## 2021-12-19 DIAGNOSIS — I6350 Cerebral infarction due to unspecified occlusion or stenosis of unspecified cerebral artery: Secondary | ICD-10-CM

## 2021-12-19 DIAGNOSIS — I639 Cerebral infarction, unspecified: Secondary | ICD-10-CM | POA: Diagnosis not present

## 2021-12-19 DIAGNOSIS — E1159 Type 2 diabetes mellitus with other circulatory complications: Secondary | ICD-10-CM

## 2021-12-19 DIAGNOSIS — E7800 Pure hypercholesterolemia, unspecified: Secondary | ICD-10-CM | POA: Diagnosis not present

## 2021-12-19 DIAGNOSIS — I160 Hypertensive urgency: Secondary | ICD-10-CM | POA: Diagnosis not present

## 2021-12-19 LAB — LIPID PANEL
Cholesterol: 160 mg/dL (ref 0–200)
HDL: 44 mg/dL (ref >40)
LDL Cholesterol: 104 mg/dL — ABNORMAL HIGH (ref 0–99)
Total CHOL/HDL Ratio: 3.6 RATIO
Triglycerides: 62 mg/dL (ref <150)
VLDL: 12 mg/dL (ref 0–40)

## 2021-12-19 LAB — HEPATITIS B SURFACE ANTIGEN: Hepatitis B Surface Ag: NONREACTIVE

## 2021-12-19 LAB — GLUCOSE, CAPILLARY
Glucose-Capillary: 136 mg/dL — ABNORMAL HIGH (ref 70–99)
Glucose-Capillary: 136 mg/dL — ABNORMAL HIGH (ref 70–99)
Glucose-Capillary: 123 mg/dL — ABNORMAL HIGH (ref 70–99)
Glucose-Capillary: 125 mg/dL — ABNORMAL HIGH (ref 70–99)
Glucose-Capillary: 136 mg/dL — ABNORMAL HIGH (ref 70–99)
Glucose-Capillary: 154 mg/dL — ABNORMAL HIGH (ref 70–99)

## 2021-12-19 LAB — MRSA NEXT GEN BY PCR, NASAL: MRSA by PCR Next Gen: NOT DETECTED

## 2021-12-19 LAB — ECHOCARDIOGRAM COMPLETE
Area-P 1/2: 2.15 cm2
Height: 68 in
S' Lateral: 2.1 cm
Single Plane A4C EF: 88.8 %
Weight: 1943.58 oz

## 2021-12-19 LAB — MAGNESIUM: Magnesium: 2.2 mg/dL (ref 1.7–2.4)

## 2021-12-19 LAB — HEPATITIS B SURFACE ANTIBODY,QUALITATIVE: Hep B S Ab: NONREACTIVE

## 2021-12-19 LAB — HEPATITIS B CORE ANTIBODY, TOTAL: Hep B Core Total Ab: NONREACTIVE

## 2021-12-19 LAB — HEPATITIS C ANTIBODY: HCV Ab: REACTIVE — AB

## 2021-12-19 MED ORDER — CHLORHEXIDINE GLUCONATE CLOTH 2 % EX PADS
6.0000 | MEDICATED_PAD | Freq: Every day | CUTANEOUS | Status: DC
Start: 2021-12-20 — End: 2021-12-22
  Administered 2021-12-20: 6 via TOPICAL

## 2021-12-19 MED ORDER — ONDANSETRON HCL 4 MG/2ML IJ SOLN
4.0000 mg | Freq: Four times a day (QID) | INTRAMUSCULAR | Status: DC | PRN
  Administered 2021-12-19 – 2021-12-22 (×5): 4 mg via INTRAVENOUS
  Filled 2021-12-19 (×7): qty 2

## 2021-12-19 MED ORDER — CHLORHEXIDINE GLUCONATE CLOTH 2 % EX PADS
6.0000 | MEDICATED_PAD | Freq: Every day | CUTANEOUS | Status: DC
  Administered 2021-12-19 – 2021-12-27 (×6): 6 via TOPICAL

## 2021-12-19 MED ORDER — PERFLUTREN LIPID MICROSPHERE
1.0000 mL | INTRAVENOUS | Status: AC | PRN
  Administered 2021-12-19: 2 mL via INTRAVENOUS

## 2021-12-19 MED ORDER — ATORVASTATIN CALCIUM 40 MG PO TABS
40.0000 mg | ORAL_TABLET | Freq: Every day | ORAL | Status: DC
  Administered 2021-12-19 – 2021-12-28 (×8): 40 mg via ORAL
  Filled 2021-12-19 (×9): qty 1

## 2021-12-19 MED ORDER — DOXERCALCIFEROL 4 MCG/2ML IV SOLN
4.0000 ug | INTRAVENOUS | Status: DC
Start: 2021-12-20 — End: 2021-12-29
  Administered 2021-12-22 – 2021-12-24 (×2): 4 ug via INTRAVENOUS
  Filled 2021-12-19 (×2): qty 2

## 2021-12-19 MED ORDER — CINACALCET HCL 30 MG PO TABS
30.0000 mg | ORAL_TABLET | ORAL | Status: DC
  Administered 2021-12-20 – 2021-12-27 (×3): 30 mg via ORAL
  Filled 2021-12-19 (×4): qty 1

## 2021-12-19 NOTE — Progress Notes (Signed)
OT Cancellation Note  Patient Details Name: Shawn Collins MRN: [id number] DOB: [birth year]   Cancelled Treatment:    Reason Eval/Treat Not Completed: Active bedrest order (Per TNK protocol; OT evaluation to f/u as activity orders porgress)  Aldona Bar A Khanh Tanori 12/19/2021, 7:22 AM

## 2021-12-19 NOTE — Progress Notes (Signed)
Patient belongings:  Shawn Collins $47  Cell Phone Keys Pants/Belt  Shirt  Socks

## 2021-12-19 NOTE — Evaluation (Signed)
Clinical/Bedside Swallow Evaluation Patient Details  Name: Shawn Collins MRN: [id number] Date of Birth: 09/11/1950  Today's Date: 12/19/2021 Time: SLP Start Time (ACUTE ONLY): 0750 SLP Stop Time (ACUTE ONLY): 0807 SLP Time Calculation (min) (ACUTE ONLY): 17 min  Past Medical History:  Past Medical History:  Diagnosis Date   CAD (coronary artery disease), native coronary artery    s/p NSTEMI with cath showing severe 3VD s/p  CABG x 3 with LIMA to LAD, SVG to OM1/OM2 and SVG D1 by Dr. Servando Snare on 10/26/16.   CKD (chronic kidney disease) stage 3, GFR 30-59 ml/min (HCC) 09/2016   COPD (chronic obstructive pulmonary disease) ([place]) 09/10/2014   Diabetes mellitus without complication (HCC)    Type II   Dizzy-->?Orthostatic? 09/10/2014   Emphysema lung ([place])    ESRD (end stage renal disease) ([place])    Hyperlipidemia LDL goal <70 04/17/2017   Hypertension    Hypoxia 09/10/2014   Past Surgical History:  Past Surgical History:  Procedure Laterality Date   CORONARY ARTERY BYPASS GRAFT N/A 10/26/2016   Procedure: CORONARY ARTERY BYPASS GRAFTING (CABG) x 4 USING LEFT INTERNAL MAMMARY ARTERY TO LAD AND ENDOSCOPICALLY HARVESTED GREATER SAPHENOUS VEIN TO OM 1, 2 AND TO DIAG;  Surgeon: Grace Isaac, MD;  Location: [facility];  Service: Open Heart Surgery;  Laterality: N/A;   ENDOVEIN HARVEST OF GREATER SAPHENOUS VEIN Right 10/26/2016   Procedure: ENDOVEIN HARVEST OF GREATER SAPHENOUS VEIN;  Surgeon: Grace Isaac, MD;  Location: [facility];  Service: Open Heart Surgery;  Laterality: Right;   IR THORACENTESIS ASP PLEURAL SPACE W/IMG GUIDE  11/03/2016   LEFT HEART CATH AND CORONARY ANGIOGRAPHY N/A 10/21/2016   Procedure: Left Heart Cath and Coronary Angiography;  Surgeon: Martinique, Peter M, MD;  Location: [facility];  Service: Cardiovascular;  Laterality: N/A;   TEE WITHOUT CARDIOVERSION N/A 10/26/2016   Procedure: TRANSESOPHAGEAL ECHOCARDIOGRAM (TEE);  Surgeon: Grace Isaac, MD;  Location: [facility];   Service: Open Heart Surgery;  Laterality: N/A;   HPI:  71 year old male with a history of diabetes, hypertension, COPD, end-stage renal disease who started on dialysis 2 months ago who currently has a tunneling catheter in the right chest, prior CAD status post CABG x3 who is presenting today with EMS for sudden onset of nausea vomiting. Imaging revealed an age-indeterminate lacunar infarct within the posterior limb of left internal capsule    Assessment / Plan / Recommendation  Clinical Impression  Mr. Forget was lethargic throughout this evaluation d/t Ativan administration. He required max verbal and tactile cues for participation in PO trials. Oral mech exam was not completed d/t difficulty following directions from lethargy. He had no obvious oral weakness or discoordination and consumed regular solids, thin liquids, and ice chips without difficulty. He did verbalize "still got some in there" after graham cracker referring to oral residuals. A liquid wash resolved this.   He had no s/s aspiration with any consistency trialed. He is recommended for thin liquids, regular solids, meds as tolerated, general aspiration precautions. Pt should only be fed when alert and participatory in intake.  SLP service will follow up for cognitive-linguistic evaluation once pt is more alert.   SLP Visit Diagnosis: Dysphagia, oral phase (R13.11)    Aspiration Risk  Mild aspiration risk    Diet Recommendation Thin liquid;Regular   Medication Administration: Whole meds with liquid Postural Changes: Seated upright at 90 degrees    Other  Recommendations Oral Care Recommendations: Oral care BID    Recommendations  for follow up therapy are one component of a multi-disciplinary discharge planning process, led by the attending physician.  Recommendations may be updated based on patient status, additional functional criteria and insurance authorization.  Frequency and Duration min 1 x/week  1 week        Prognosis Prognosis for Safe Diet Advancement: Good      Swallow Study   General Date of Onset: 12/19/21 HPI: 71 year old male with a history of diabetes, hypertension, COPD, end-stage renal disease who started on dialysis 2 months ago who currently has a tunneling catheter in the right chest, prior CAD status post CABG x3 who is presenting today with EMS for sudden onset of nausea vomiting. Imaging revealed an age-indeterminate lacunar infarct within the posterior limb of left internal capsule Type of Study: Bedside Swallow Evaluation Previous Swallow Assessment: n/a Diet Prior to this Study: NPO Temperature Spikes Noted: No Respiratory Status: Room air History of Recent Intubation: No Behavior/Cognition: Lethargic/Drowsy;Requires cueing Oral Cavity Assessment: Within Functional Limits Oral Care Completed by SLP: No Oral Cavity - Dentition: Adequate natural dentition Patient Positioning: Upright in bed Baseline Vocal Quality: Normal    Oral/Motor/Sensory Function   Unable to test   Ice Chips Ice chips: Within functional limits Presentation: Spoon   Thin Liquid Thin Liquid: Within functional limits Presentation: [place] P. Dannilynn Gallina, M.S., CCC-SLP Speech-Language Pathologist Acute Rehabilitation Services Pager: [phone]  Solid: Within functional limits Presentation: [place] 12/19/2021,8:57 AM

## 2021-12-19 NOTE — Plan of Care (Signed)
  Problem: Coping: Goal: Ability to adjust to condition or change in health will improve Outcome: Progressing   Problem: Education: Goal: Knowledge of patient specific risk factors will improve (INDIVIDUALIZE FOR PATIENT) Outcome: Progressing   Problem: Education: Goal: Knowledge of disease or condition will improve Outcome: Progressing Goal: Knowledge of secondary prevention will improve (SELECT ALL) Outcome: Progressing Goal: Knowledge of patient specific risk factors will improve (INDIVIDUALIZE FOR PATIENT) Outcome: Progressing

## 2021-12-19 NOTE — Consult Note (Signed)
ESRD Consult Note  Assessment/Recommendations:   ESRD:  -Outpatient orders: [facility], MWF.  4 hours.  F1 80.  400/800 flow rates.  EDW 56.5 kg.  2K, 2.5 Cal.  Access: TDC.  Treatment meds: Sensipar 30 mg 3 times weekly, Hectorol 4 mcg 3 times weekly, not on ESA/Fe.  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 Will UF as tolerated.  Okay for permissive hypertension.  Of note, has chronic hypertension and seems to be nonadherent with oral medications at times.  Can add back home medications as needed  Anemia of Chronic Kidney Disease: Hemoglobin 16 on 7/22. Not on ESA or iron, monitor for now.   Secondary Hyperparathyroidism/Hyperphosphatemia: will resume sensipar. Will check RFP tomorrow and see if he needs to get r/s'ed on binders (on velphoro at home)  DM 2 -Per primary  Vascular access: TDC, if anticipating prolonged stay then maybe can get him permanent access (avf/avg) while he's here (has been a struggle to get this done as an outpatient)   Additional recommendations: - Dose all meds for creatinine clearance < 10 ml/min  - Unless absolutely necessary, no MRIs with gadolinium.  - Implement save arm precautions.  Prefer needle sticks in the dorsum of the hands or wrists.  No blood pressure measurements in arm. - If blood transfusion is requested during hemodialysis sessions, please alert [place] prior to the session.  - If a hemodialysis catheter line culture is requested, please alert [place] as only hemodialysis nurses are able to collect those specimens.   Recommendations were discussed with the primary team.   History of Present Illness: Shawn Collins is a/an 71 y.o. male with a past medical history of ESRD, hypertension, DM 2, CAD status post CABG, hyperlipidemia who presents with nausea/vomiting, significantly of low BP.  CT head did not show any  acute abnormalities, did receive TNK.  CTA head and neck showed left VA functional chronic occlusion.  Likely has a brain some small infarct.  Off Cleviprex. Last HD was on Friday 7/21.  Struggling with BP management as an outpatient given that he frequently misses doses of his antihypertensives at home per documentation.  Have also been struggling to get him permanent access as an outpatient. Patient seen and examined. No new complaints at this time. He reports that he was supposed to have his catheter swapped out on Tues (?).  Medications:  Current Facility-Administered Medications  Medication Dose Route Frequency Provider Last Rate Last Admin    stroke: early stages of recovery book   Does not apply Once Bhagat, Srishti L, MD       acetaminophen (TYLENOL) tablet 650 mg  650 mg Oral Q4H PRN Bhagat, Srishti L, MD       Or   acetaminophen (TYLENOL) 160 MG/5ML solution 650 mg  650 mg Per Tube Q4H PRN Bhagat, Srishti L, MD       Or   acetaminophen (TYLENOL) suppository 650 mg  650 mg Rectal Q4H PRN Bhagat, Srishti L, MD       atorvastatin (LIPITOR) tablet 40 mg  40 mg Oral Daily Rosalin Hawking, MD       Chlorhexidine Gluconate Cloth 2 % PADS 6 each  6 each Topical Q0600 Rosalin Hawking, MD       clevidipine (CLEVIPREX) infusion 0.5 mg/mL  0-21 mg/hr Intravenous Continuous Bhagat, Dutch Quint, MD   Stopped at 12/19/21 [id number]  hydrALAZINE (APRESOLINE) injection 5 mg  5 mg Intravenous Q6H PRN Bhagat, Srishti L, MD       insulin aspart (novoLOG) injection 0-6 Units  0-6 Units Subcutaneous Q4H Bhagat, Srishti L, MD   1 Units at 12/19/21 0100   labetalol (NORMODYNE) injection 5 mg  5 mg Intravenous Q2H PRN Bhagat, Srishti L, MD       ondansetron (ZOFRAN) injection 4 mg  4 mg Intravenous Q6H PRN Rosalin Hawking, MD       pantoprazole (PROTONIX) injection 40 mg  40 mg Intravenous QHS Bhagat, Srishti L, MD   40 mg at 12/18/21 2108   senna-docusate (Senokot-S) tablet 1 tablet  1 tablet Oral QHS PRN Bhagat, Dutch Quint, MD          ALLERGIES Patient has no known allergies.  MEDICAL HISTORY Past Medical History:  Diagnosis Date   CAD (coronary artery disease), native coronary artery    s/p NSTEMI with cath showing severe 3VD s/p  CABG x 3 with LIMA to LAD, SVG to OM1/OM2 and SVG D1 by Dr. Servando Snare on 10/26/16.   CKD (chronic kidney disease) stage 3, GFR 30-59 ml/min (HCC) 09/2016   COPD (chronic obstructive pulmonary disease) ([place]) 09/10/2014   Diabetes mellitus without complication (HCC)    Type II   Dizzy-->?Orthostatic? 09/10/2014   Emphysema lung (HCC)    ESRD (end stage renal disease) ([place])    Hyperlipidemia LDL goal <70 04/17/2017   Hypertension    Hypoxia 09/10/2014     SOCIAL HISTORY Social History   Socioeconomic History   Marital status: Single    Spouse name: Not on file   Number of children: Not on file   Years of education: Not on file   Highest education level: Not on file  Occupational History   Not on file  Tobacco Use   Smoking status: Former    Packs/day: 0.25    Years: 8.00    Total pack years: 2.00    Types: Cigarettes    Quit date: 50    Years since quitting: 51.5   Smokeless tobacco: Never  Vaping Use   Vaping Use: Never used  Substance and Sexual Activity   Alcohol use: No   Drug use: No   Sexual activity: Not on file  Other Topics Concern   Not on file  Social History Narrative   Not on file   Social Determinants of Health   Financial Resource Strain: Not on file  Food Insecurity: Not on file  Transportation Needs: Not on file  Physical Activity: Not on file  Stress: Not on file  Social Connections: Not on file  Intimate Partner Violence: Not on file     FAMILY HISTORY Family History  Problem Relation Age of Onset   Diabetes Mellitus II Mother    Stroke Father    Hypertension Father    Diabetes Mellitus II Sister      Review of Systems: 12 systems were reviewed and negative except per HPI  Physical Exam: Vitals:   12/19/21 1130  12/19/21 1200  BP: (!) 158/77 (!) 175/82  Pulse: 65 65  Resp: 20 15  Temp:    SpO2: 100% 100%   Total I/O In: 235.9 [I.V.:235.9] Out: -   Intake/Output Summary (Last 24 hours) at 12/19/2021 1323 Last data filed at 12/19/2021 1200 Gross per 24 hour  Intake 760.79 ml  Output --  Net 760.79 ml   General: well-appearing, no acute distress HEENT: anicteric sclera, MMM CV: normal rate,  no murmurs, no edema Lungs: bilateral chest rise, normal wob Abd: soft, non-tender, non-distended Skin: no visible lesions or rashes Psych: alert, engaged, appropriate mood and affect Neuro: normal speech, no gross focal deficits  Dialysis access: [facility] c/d/i  Test Results Reviewed Lab Results  Component Value Date   NA 140 12/18/2021   K 4.0 12/18/2021   CL 103 12/18/2021   CO2 22 12/18/2021   BUN 27 (H) 12/18/2021   CREATININE 8.20 (H) 12/18/2021   CALCIUM 8.7 (L) 12/18/2021   ALBUMIN 4.1 12/18/2021   PHOS 2.5 11/01/2016    I have reviewed relevant outside healthcare records

## 2021-12-19 NOTE — Progress Notes (Signed)
PT Cancellation Note  Patient Details Name: Edword Cu MRN: [id number] DOB: 12-Jun-1950   Cancelled Treatment:    Reason Eval/Treat Not Completed: Active bedrest order. Will plan to follow-up as able.   Moishe Spice, PT, DPT Acute Rehabilitation Services  Office: [place] 12/19/2021, 11:19 AM

## 2021-12-19 NOTE — Progress Notes (Addendum)
STROKE TEAM PROGRESS NOTE   INTERVAL HISTORY No family at the bedside.  Sleeping but easily arousable.  He had an ativan appx 2hrs prior to exam so he was a little more groggy per nurse report.  Still having some nausea but no more dizziness.  Now off cleviprex, needed PO labetalol earlier this morning  for elevated BP.  Currently still working with permissive HTN.  Overnight: had mag and K+ replacements, recheck EKG, QTC<500  Passed swallow exam with SLP today.  Mild aspiration risk. Regular diet/thin liquid approved.  Overall much improvement than on arrival.   Vitals:   12/19/21 0930 12/19/21 1000 12/19/21 1030 12/19/21 1100  BP: (!) 164/77 (!) 161/76 (!) 161/73 (!) 153/74  Pulse: 64 63 65 65  Resp: 13 12 12 13   Temp:      TempSrc:      SpO2: 100% 100% 100% 100%  Weight:      Height:       CBC:  Recent Labs  Lab 12/18/21 1811 12/18/21 1822  WBC 6.8  --   NEUTROABS 4.9  --   HGB 14.6 16.0  HCT 43.9 47.0  MCV 96.3  --   PLT 236  --    Basic Metabolic Panel:  Recent Labs  Lab 12/18/21 1811 12/18/21 1822 12/19/21 0559  NA 140 140  --   K 4.0 4.0  --   CL 102 103  --   CO2 22  --   --   GLUCOSE 133* 136*  --   BUN 25* 27*  --   CREATININE 7.57* 8.20*  --   CALCIUM 8.7*  --   --   MG  --   --  2.2   Lipid Panel:  Recent Labs  Lab 12/19/21 0559  CHOL 160  TRIG 62  HDL 44  CHOLHDL 3.6  VLDL 12  LDLCALC 104*   HgbA1c:  Recent Labs  Lab 12/18/21 1950  HGBA1C 5.2   Alcohol Level  Recent Labs  Lab 12/18/21 1950  ETH <10   ________________________________________________________________  IMAGING past 48hrs  CT ANGIO HEAD NECK W WO CM (CODE STROKE)  Result Date: 12/18/2021 CLINICAL DATA:  Provided history: Neuro deficit, acute, stroke suspected. EXAM: CT ANGIOGRAPHY HEAD AND NECK TECHNIQUE: Multidetector CT imaging of the head and neck was performed using the standard protocol during bolus administration of intravenous contrast. Multiplanar CT  image reconstructions and MIPs were obtained to evaluate the vascular anatomy. Carotid stenosis measurements (when applicable) are obtained utilizing NASCET criteria, using the distal internal carotid diameter as the denominator. RADIATION DOSE REDUCTION: This exam was performed according to the departmental dose-optimization program which includes automated exposure control, adjustment of the mA and/or kV according to patient size and/or use of iterative reconstruction technique. CONTRAST:  82mL OMNIPAQUE IOHEXOL 350 MG/ML SOLN COMPARISON:  Noncontrast head CT performed earlier today 12/18/2021. FINDINGS: CTA NECK FINDINGS Aortic arch: Standard aortic branching. The visualized aortic arch is normal in caliber. Minimal atherosclerotic plaque within the proximal left subclavian artery. No hemodynamically significant innominate or proximal subclavian artery stenosis. No hemodynamically significant innominate or proximal subclavian artery stenosis. Right carotid system: CCA and ICA patent within the neck without hemodynamically significant stenosis (50% or greater). Mild atherosclerotic plaque within the proximal ICA. Left carotid system: CCA and ICA patent within the neck without stenosis or significant atherosclerotic disease. Vertebral arteries: The right vertebral artery is patent within the neck. Mild to moderate atherosclerotic narrowing at the origin of this vessel. There is  also mild atherosclerotic narrowing at the level of the skull base. Only intermittent enhancement is seen within the left vertebral artery within the neck, and this vessel is likely functionally occluded. Skeleton: Cervical spondylosis. No acute fracture or aggressive osseous lesion. Other neck: No neck mass or cervical lymphadenopathy. Upper chest: Prior median sternotomy. No consolidation within the imaged lung apices. Review of the MIP images confirms the above findings CTA HEAD FINDINGS Anterior circulation: The intracranial internal  carotid arteries are patent. Atherosclerotic plaque within both vessels. Moderate stenosis within the supraclinoid right ICA. No more than mild stenosis elsewhere within the cranial ICAs. The M1 middle cerebral arteries are patent. Atherosclerotic irregularity of the M2 and more distal MCA vessels, bilaterally. Most notably, there is a moderate stenosis within an inferior division proximal right M2 MCA vessel (series 8, image 18). The anterior cerebral arteries are patent. Posterior circulation: The intracranial right vertebral artery is patent. Atherosclerotic plaque within this vessel with up to moderate stenosis. Only intermittent enhancement is seen within the intracranial left vertebral artery and this vessel is likely functionally occluded. At least some enhancement is seen within the proximal left PICA. The basilar artery is patent. The posterior cerebral arteries are patent. Hypoplastic left P1 segment with sizable left posterior communicating artery. A sizable right posterior communicating artery is also present. Moderate stenosis within the left P2 segment. Venous sinuses: Within the limitations of contrast timing, no convincing thrombus. Anatomic variants: As described. Review of the MIP images confirms the above findings These results were called by telephone at the time of interpretation on 12/18/2021 at 7:23 pm to provider [facility] , who verbally acknowledged these results. IMPRESSION: CTA neck: 1. 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 2. The right vertebral artery is patent. Mild-to-moderate atherosclerotic narrowing at the origin of this vessel. 3. The common carotid and internal carotid arteries are patent within the neck without hemodynamically significant stenosis. Mild atherosclerotic plaque about the right carotid bifurcation and within the proximal right ICA. 4. Cervical spondylosis. CTA head: 1.  Only intermittent enhancement is seen within the intracranial left vertebral artery and this vessel is likely functionally occluded. At least some enhancement is seen within the proximal left PICA. 2. Additional intracranial atherosclerotic disease with multifocal stenoses, as outlined and most notably as follows. Moderate stenosis within the supraclinoid right ICA. Moderate stenosis within an inferior division proximal M2 right MCA vessel. Electronically Signed   By: Kellie Simmering D.O.   On: 12/18/2021 19:25   CT HEAD CODE STROKE WO CONTRAST  Result Date: 12/18/2021 CLINICAL DATA:  Code stroke. Neuro deficit, acute, stroke suspected. EXAM: CT HEAD WITHOUT CONTRAST TECHNIQUE: Contiguous axial images were obtained from the base of the skull through the vertex without intravenous contrast. RADIATION DOSE REDUCTION: This exam was performed according to the departmental dose-optimization program which includes automated exposure control, adjustment of the mA and/or kV according to patient size and/or use of iterative reconstruction technique. COMPARISON:  No pertinent prior exams available for comparison. FINDINGS: Brain: Moderate generalized cerebral atrophy.  Mild cerebellar atrophy. Chronic appearing lacunar infarct within the right frontoparietal white matter (series 4, image 22). Age-indeterminate lacunar infarct within the posterior limb of left internal capsule (series 4, image 15). Chronic lacunar infarcts elsewhere within the left internal capsule and lentiform nucleus. Background mild-to-moderate patchy and ill-defined hypoattenuation within the cerebral white matter, nonspecific but compatible with chronic small vessel disease. There is no acute intracranial hemorrhage.  No demarcated cortical infarct. No extra-axial fluid collection. No evidence of an intracranial mass. No midline shift. Vascular: No hyperdense vessel.  Atherosclerotic calcifications. Skull: No fracture or aggressive osseous lesion.  Sinuses/Orbits: No mass or acute finding within the imaged orbits. Minimal mucosal thickening scattered within the paranasal sinuses at the imaged levels. ASPECTS ([place] Stroke Program Early CT Score) - Ganglionic level infarction (caudate, lentiform nuclei, internal capsule, insula, M1-M3 cortex): 6 - Supraganglionic infarction (M4-M6 cortex): 3 Total score (0-10 with 10 being normal): 9 Age-indeterminate lacunar infarct within the posterior limb of left internal capsule. These results were communicated to Shawn Collins At 6:34 pmon 7/22/2023by text page via the [facility] messaging system. IMPRESSION: Age-indeterminate lacunar infarct within the posterior limb of left internal capsule. ASPECTS is 9. Background parenchymal atrophy, chronic small vessel ischemic disease and chronic lacunar infarcts, as described. Electronically Signed   By: Kellie Simmering D.O.   On: 12/18/2021 18:34   DG Chest Port 1 View  Result Date: 12/18/2021 CLINICAL DATA:  Vomiting and hypertension. EXAM: PORTABLE CHEST 1 VIEW COMPARISON:  01/09/2017 FINDINGS: Postoperative changes in the mediastinum. Right central venous catheter with tip over the low SVC region. Heart size and pulmonary vascularity are normal. Lungs are clear. No pleural effusions. No pneumothorax. Mediastinal contours appear intact. IMPRESSION: No active disease. Electronically Signed   By: Lucienne Capers M.D.   On: 12/18/2021 18:19   ________________________________________________________________  PHYSICAL EXAM  Neurological Exam: Mental status/Cognition: Alert, oriented x4  Speech/language: Fluent, comprehension intact, object naming intact, repetition intact. Speech clear, no dysarthria  Cranial nerves:   CN II Pupils equal and reactive to light, no VF deficits    CN III,IV,VI EOM intact, no gaze preference or deviation, nystagmus bil L>R. denies diplopia.  No ptosis   CN V normal sensation in V1, V2, and V3 segments bilaterally    CN VII no asymmetry, no  nasolabial fold flattening    CN VIII normal hearing to speech    CN IX & X normal palatal elevation, no uvular deviation    CN XI 5/5 head turn and 5/5 shoulder shrug bilaterally    CN XII midline tongue protrusion      NIHSS 1a Level of Conscious.: 0 1b LOC Questions: 0 1c LOC Commands: 0 2 Best Gaze: 0 3 Visual: 0 4 Facial Palsy: 0 5a Motor Arm - left: 0 5b Motor Arm - Right: 0 6a Motor Leg - Left: 0 6b Motor Leg - Right: 0 7 Limb Ataxia: L arm and bil legs - all mild: 2 8 Sensory: 0 9 Best Language: 0 10 Dysarthria: 0 11 Extinct. and Inatten.: 0       TOTAL: 2 at 9am 7/23  Motor:  Muscle bulk and tone: normal Pronator drift: none Tremor: none  Strength: RUE:  Grip 5/5    biceps    5/5   triceps  5/5      LUE:  Grip 5/5    biceps  5/5   triceps  5/5 RLE:  Thigh 5/5    knee   5/5    plantar flexion  5/5   dorsiflexion  5/5        LLE:  Thigh 5/5    knee   5/5    plantar flexion  5/5   dorsiflexion  5/5   Reflexes: deferred   Sensation: FACE: normal,  ARMS: normal bil,  LEGS: normal bil   Coordination/Complex Motor:  - Finger to Nose intact BL - Heel to shin  intact BL - Rapid alternating movement are normal - Gait: deferred, pt in bed. ________________________________________________________________  ASSESSMENT/PLAN [name] is a 71 y.o. male with history of COPD, DM2, HTN, Hx of NSTEMI, CKD/ESRD, CAD presenting with sudden N/V and dizziness. S/p TNK  Stroke: probable brainstem infarct s/p TNK likely due to small vessel disease given risk factors Still has nystagmus  CT Head 7/22: age indeterminate lacunar infarct posterior limb of L internal capsule, ASPECTS 9.  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  Moderate stenosis within the supraclinoid right ICA. Moderate stenosis within an inferior division proximal M2 right MCA vessel. 24hr MRI Brain 7/23: pending. If stable, pt can be  transferred out of the ICU 2D Echo pending LDL 104 A1C 5.2 UDS pending VTE prophylaxis - SCD's aspirin 81 mg daily prior to admission, now on No antithrombotic within 24h of TNK  PT/OT: pending  Deposition - pending  Hypertension Home meds:  amlodipine 10mg /d, carvedilol 6.25mg  bid, furosamide 40mg /d, losartan 25mg /d Unstable now off clevidipne IV Permissive hypertension (OK if < 180/105 post TNK will) but gradually normalize in 5-7 days Long-term BP goal normotensive  Hyperlipidemia Home meds:  atorvastatin 20mg /d  LDL 104, goal < 70 Now on atorvastatin 40mg /d Continue statin at discharge  Diabetes type II  Well Controlled per A1C result Home meds:  glargine insulin 10u p.m. HgbA1c 5.2, goal < 7.0 CBGs:   Sliding Scale Insulin Close PCP follow-up for DM control  Other Stroke Risk Factors Advanced Age >/= 53  COPD/Emphysema: former Cigarette smoker Family hx stroke (father) Coronary artery disease s/p CABG  Other active problems ESRD on HD, d/c IVF, on diet, discussed with Dr. Candiss Norse nephrology for HD arrangement as inpt ? QT prolongation, repeat EKG, now QTc within range.  Hospital day # 1  Patient seen and examined by NP with MD. MD to update note as needed.   Dellia Beckwith, Shawn Collins  ATTENDING NOTE: I reviewed above note and agree with the assessment and plan. Pt was seen and examined.   71 year old male with history of hypertension, hyperlipidemia, diabetes, CAD status post CABG, ESRD on hemodialysis admitted for nausea vomiting, significant elevated BP, disconjugate eyes and right facial droop.  CT no acute abnormality.  Status post TNK.  CTA head and neck showed left VA functional chronic occlusion.  Right ICA siphon and right M2 moderate stenosis.  MRI pending, 2D echo pending, A1c 5.2, LDL 104, UDS pending.  Creatinine 7.57.  On exam, patient mildly drowsy, however easily arousable and able to maintain wakefulness for  exam.  Orientated to time, place and age, but not quite orientated to situation.  No aphasia, paucity of speech, follows all simple commands.  Able to name and repeat.  Visual field full, no gaze palsy, however left gaze coarse horizontal nystagmus, right gaze minimal nystagmus.  No significant facial droop, tongue midline.  Bilateral upper and lower extremity equal strength, sensation symmetrical, bilateral FTN slow but grossly intact, left heel-to-shin mildly ataxic.  Gait not tested  Etiology of her change in symptoms concerning for brainstem small infarct.  Likely due to small vessel disease with risk factors.  Currently within 24 hours of TNK, will start antiplatelet once MRI no bleeding at 24 hours post TNK.  Increase home Lipitor 20-40.  BP goal less than 180/105 post TNK, off Cleviprex.  DC bedrest, PT/OT pending.  Patient passed swallow, on diabetic diet.  Contact nephrology Dr.  Candiss Norse for HD management.  For detailed assessment and plan, please refer to above/below as I have made changes wherever appropriate.   Rosalin Hawking, MD PhD Stroke Neurology 12/19/2021 1:16 PM  This patient is critically ill due to brainstem infarct status post TNK, hypertensive urgency and at significant risk of neurological worsening, death form recurrent stroke, hemorrhagic transformation, bleeding from TNK, hypertensive encephalopathy. This patient's care requires constant monitoring of vital signs, hemodynamics, respiratory and cardiac monitoring, review of multiple databases, neurological assessment, discussion with family, other specialists and medical decision making of high complexity. I spent 35 minutes of neurocritical care time in the care of this patient.     To contact Stroke Continuity provider, please refer to [email or web address] After hours, contact General Neurology

## 2021-12-20 DIAGNOSIS — I160 Hypertensive urgency: Secondary | ICD-10-CM | POA: Diagnosis not present

## 2021-12-20 DIAGNOSIS — I639 Cerebral infarction, unspecified: Secondary | ICD-10-CM | POA: Diagnosis not present

## 2021-12-20 DIAGNOSIS — N186 End stage renal disease: Secondary | ICD-10-CM | POA: Diagnosis not present

## 2021-12-20 DIAGNOSIS — Z992 Dependence on renal dialysis: Secondary | ICD-10-CM

## 2021-12-20 DIAGNOSIS — I6350 Cerebral infarction due to unspecified occlusion or stenosis of unspecified cerebral artery: Secondary | ICD-10-CM | POA: Diagnosis not present

## 2021-12-20 LAB — CBC
HCT: 40.1 % (ref 39.0–52.0)
Hemoglobin: 13.5 g/dL (ref 13.0–17.0)
MCH: 32.5 pg (ref 26.0–34.0)
MCHC: 33.7 g/dL (ref 30.0–36.0)
MCV: 96.4 fL (ref 80.0–100.0)
Platelets: 279 10*3/uL (ref 150–400)
RBC: 4.16 MIL/uL — ABNORMAL LOW (ref 4.22–5.81)
RDW: 13.2 % (ref 11.5–15.5)
WBC: 8.4 10*3/uL (ref 4.0–10.5)
nRBC: 0 % (ref 0.0–0.2)

## 2021-12-20 LAB — PHOSPHORUS: Phosphorus: 7.5 mg/dL — ABNORMAL HIGH (ref 2.5–4.6)

## 2021-12-20 LAB — HEPATITIS B SURFACE ANTIBODY, QUANTITATIVE: Hep B S AB Quant (Post): <3.1 m[IU]/mL — ABNORMAL LOW (ref >9.9)

## 2021-12-20 LAB — BASIC METABOLIC PANEL
Anion gap: 16 — ABNORMAL HIGH (ref 5–15)
BUN: 51 mg/dL — ABNORMAL HIGH (ref 8–23)
CO2: 20 mmol/L — ABNORMAL LOW (ref 22–32)
Calcium: 8.2 mg/dL — ABNORMAL LOW (ref 8.9–10.3)
Chloride: 101 mmol/L (ref 98–111)
Creatinine, Ser: 10.24 mg/dL — ABNORMAL HIGH (ref 0.61–1.24)
GFR, Estimated: 5 mL/min — ABNORMAL LOW (ref >60)
Glucose, Bld: 112 mg/dL — ABNORMAL HIGH (ref 70–99)
Potassium: 5 mmol/L (ref 3.5–5.1)
Sodium: 137 mmol/L (ref 135–145)

## 2021-12-20 LAB — RAPID URINE DRUG SCREEN, HOSP PERFORMED
Amphetamines: NOT DETECTED
Barbiturates: NOT DETECTED
Benzodiazepines: NOT DETECTED
Cocaine: NOT DETECTED
Opiates: NOT DETECTED
Tetrahydrocannabinol: NOT DETECTED

## 2021-12-20 LAB — URINALYSIS, ROUTINE W REFLEX MICROSCOPIC
Bilirubin Urine: NEGATIVE
Glucose, UA: NEGATIVE mg/dL
Ketones, ur: NEGATIVE mg/dL
Nitrite: NEGATIVE
Protein, ur: 100 mg/dL — AB
Specific Gravity, Urine: 1.019 (ref 1.005–1.030)
WBC, UA: >50 WBC/hpf — ABNORMAL HIGH (ref 0–5)
pH: 6 (ref 5.0–8.0)

## 2021-12-20 LAB — GLUCOSE, CAPILLARY
Glucose-Capillary: 112 mg/dL — ABNORMAL HIGH (ref 70–99)
Glucose-Capillary: 85 mg/dL (ref 70–99)
Glucose-Capillary: 140 mg/dL — ABNORMAL HIGH (ref 70–99)
Glucose-Capillary: 153 mg/dL — ABNORMAL HIGH (ref 70–99)
Glucose-Capillary: 149 mg/dL — ABNORMAL HIGH (ref 70–99)
Glucose-Capillary: 234 mg/dL — ABNORMAL HIGH (ref 70–99)

## 2021-12-20 MED ORDER — LOSARTAN POTASSIUM 50 MG PO TABS
25.0000 mg | ORAL_TABLET | Freq: Every day | ORAL | Status: DC
  Administered 2021-12-20: 25 mg via ORAL
  Filled 2021-12-20 (×2): qty 1

## 2021-12-20 MED ORDER — HYDRALAZINE HCL 20 MG/ML IJ SOLN: 5.0000 mg | INTRAMUSCULAR | Status: DC | PRN

## 2021-12-20 MED ORDER — HEPARIN SODIUM (PORCINE) 1000 UNIT/ML IJ SOLN
INTRAMUSCULAR | Status: AC
  Administered 2021-12-20: 1000 [IU]
  Filled 2021-12-20: qty 4

## 2021-12-20 MED ORDER — ASPIRIN 81 MG PO TBEC
81.0000 mg | DELAYED_RELEASE_TABLET | Freq: Every day | ORAL | Status: DC
  Administered 2021-12-20 – 2021-12-28 (×8): 81 mg via ORAL
  Filled 2021-12-20 (×8): qty 1

## 2021-12-20 MED ORDER — PANTOPRAZOLE SODIUM 40 MG PO TBEC
40.0000 mg | DELAYED_RELEASE_TABLET | Freq: Every day | ORAL | Status: DC
  Administered 2021-12-20 – 2021-12-28 (×7): 40 mg via ORAL
  Filled 2021-12-20 (×8): qty 1

## 2021-12-20 MED ORDER — HYDRALAZINE HCL 25 MG PO TABS
25.0000 mg | ORAL_TABLET | Freq: Once | ORAL | Status: AC
  Administered 2021-12-20: 25 mg via ORAL
  Filled 2021-12-20: qty 1

## 2021-12-20 MED ORDER — HYDRALAZINE HCL 25 MG PO TABS
25.0000 mg | ORAL_TABLET | Freq: Three times a day (TID) | ORAL | Status: DC
  Administered 2021-12-20 (×2): 25 mg via ORAL
  Filled 2021-12-20 (×2): qty 1

## 2021-12-20 MED ORDER — ENOXAPARIN SODIUM 30 MG/0.3ML IJ SOSY: 30.0000 mg | PREFILLED_SYRINGE | INTRAMUSCULAR | Status: DC

## 2021-12-20 MED ORDER — INSULIN ASPART 100 UNIT/ML IJ SOLN
0.0000 [IU] | Freq: Three times a day (TID) | INTRAMUSCULAR | Status: DC
  Administered 2021-12-20: 2 [IU] via SUBCUTANEOUS
  Administered 2021-12-22 – 2021-12-26 (×3): 1 [IU] via SUBCUTANEOUS
  Administered 2021-12-26: 2 [IU] via SUBCUTANEOUS

## 2021-12-20 MED ORDER — CLOPIDOGREL BISULFATE 75 MG PO TABS
75.0000 mg | ORAL_TABLET | Freq: Every day | ORAL | Status: DC
  Administered 2021-12-20 – 2021-12-28 (×8): 75 mg via ORAL
  Filled 2021-12-20 (×8): qty 1

## 2021-12-20 MED ORDER — HYDRALAZINE HCL 50 MG PO TABS
50.0000 mg | ORAL_TABLET | Freq: Three times a day (TID) | ORAL | Status: DC
  Administered 2021-12-20 – 2021-12-21 (×3): 50 mg via ORAL
  Filled 2021-12-20 (×4): qty 1

## 2021-12-20 MED ORDER — AMLODIPINE BESYLATE 10 MG PO TABS
10.0000 mg | ORAL_TABLET | Freq: Every day | ORAL | Status: DC
  Administered 2021-12-20: 10 mg via ORAL
  Filled 2021-12-20 (×2): qty 1

## 2021-12-20 NOTE — Plan of Care (Addendum)
    Mr. Garlan Drewes is a 71 year old male admitted due to small brainstem stroke.  Past medical history also significant for end-stage renal disease on hemodialysis.  Vascular surgery was asked to see this patient for permanent dialysis access.  He is dialyzing via right IJ TDC.  He was seen in the outpatient setting by Dr. Scot Dock in May of this year and scheduled for permanent dialysis access.  He is on the schedule for left arm AV graft versus fistula tomorrow with Dr. Scot Dock.  Mr. Buchanon is willing to proceed with surgery.  We will reach out to the neurology service to see if it would be ok to proceed with dialysis access surgery tomorrow 12/21/2021.  On-call vascular surgeon Dr. Carlis Abbott will evaluate the patient later today and provide further treatment plans.  Dagoberto Ligas, PA-C [facility] [phone] 12/20/2021  1:42 PM  I have seen and evaluated the patient. I agree with the PA note as documented above.  71 year old male with end-stage renal disease using a right IJ [facility] admitted with small brainstem stroke.  Vascular surgery was consulted for permanent hemodialysis access.  He is already scheduled tomorrow with Dr. Scot Dock as an outpatient for likely left arm AV graft.  Patient is willing to proceed.  I have spoke with neurology who feels it is okay to proceed as well given he had a very small stroke.  I have updated the patient at bedside.  Please keep n.p.o. after midnight.  Marty Heck, MD [facility] [place] Office: [phone]

## 2021-12-20 NOTE — Progress Notes (Signed)
Inpatient Rehab Admissions Coordinator: D  Discussed with Ellan Lambert, RN-CM.  Will likely not have a bed for this patient until Thursday at the earliest.  May want to investigate other area IRFs to see if they have bed availability sooner; if not, I will f/u.    Shann Medal, PT, DPT Admissions Coordinator [phone] 12/20/21  2:54 PM

## 2021-12-20 NOTE — Progress Notes (Signed)
[facility] Progress Note  Subjective: seen in room  Vitals:   12/20/21 0500 12/20/21 0600 12/20/21 0700 12/20/21 0800  BP: (!) 146/72 (!) 149/66 (!) 147/73   Pulse: 63 64 63   Resp: 12 13 13    Temp:    98.1 F (36.7 C)  TempSrc:    Oral  SpO2: 98% 97% 98%   Weight:      Height:        Exam: Gen: NAD, alert HEENT: anicteric sclera, MMM CV: normal rate, no murmurs, no edema Lungs: bilateral chest rise, normal wob Abd: soft, non-tender, non-distended Neuro: normal speech, no gross focal deficits  Dialysis access: RIJ TDC intact    OP HD: [facility] MWF 4h  400/800   56.5kg  2/2.5 bath TDC  Hep 8000 - sensipar 30 tiw po - doxecalciferol 4 ug tiw IV - no esa/ Fe   Assessment/ Plan: Acute CVA - sp TNK, 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 Per neurology HTN/ vol - permissive HTN per neurology. Has chronic HTN w/ possible nonadherence at times.  Add back home meds as needed. Is below dry wt, keep even w/ HD today.  ESRD - usual HD MWF. HD today.  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 Not on ESA or iron, monitor for now.  MBD ckd - CCa in range. Resumed sensipar and IV vdra w/ hd.  Will add on phos and see if binder is needed (takes velphoro at home).  DM 2 -per primary Vascular access - has TDC. Pt was on schedule for permanent access surgery as outpt this week. It has been difficult to get this done as an outpatient. Will consult [facility].   Kelly Splinter 12/20/2021, 8:26 AM   Recent Labs  Lab 12/18/21 1811 12/18/21 1822 12/20/21 0238  HGB 14.6 16.0 13.5  ALBUMIN 4.1  --   --   CALCIUM 8.7*  --  8.2*  CREATININE 7.57* 8.20* 10.24*  K 4.0 4.0 5.0   No results for input(s): "IRON", "TIBC", "FERRITIN" in the last 168 hours. Inpatient medications:  aspirin EC  81 mg Oral Daily   atorvastatin  40 mg Oral Daily   Chlorhexidine Gluconate Cloth  6 each Topical Q0600   Chlorhexidine Gluconate Cloth  6 each Topical Q0600   cinacalcet  30 mg Oral Q M,W,F   clopidogrel  75 mg Oral Daily    doxercalciferol  4 mcg Intravenous Q M,W,F-HD   heparin sodium (porcine)       insulin aspart  0-6 Units Subcutaneous Q4H   pantoprazole  40 mg Oral Daily    clevidipine Stopped (12/19/21 0703)   acetaminophen **OR** acetaminophen (TYLENOL) oral liquid 160 mg/5 mL **OR** acetaminophen, heparin sodium (porcine), hydrALAZINE, ondansetron (ZOFRAN) IV, senna-docusate

## 2021-12-20 NOTE — Evaluation (Signed)
Occupational Therapy Evaluation Patient Details Name: Shawn Collins MRN: [id number] DOB: 05-04-1951 Today's Date: 12/20/2021   History of Present Illness Pt is a 71 y.o. male who presented 12/18/21 with sudden N/V and dizziness. Pt administered TNK. 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 PMH: COPD, DM2, HTN, Hx of NSTEMI, CKD/ESRD, CAD   Clinical Impression   Shawn Collins was evaluated s/p the above admission list, he is indep at baseline and lives in a group home. Upon evaluation pt was generally min G for all OOB tasks including mobility with RW, bathroom transfers and LB ADLs. He was unsteady throughout, but no overt LOB. Pt has functional limitations due to dizziness, generalized weakness, decreased activity tolerance and impaired cognition. He will benefit from OT acutely. Recommend d/c to AIR to progress to mod I level prior to d./c home.    Recommendations for follow up therapy are one component of a multi-disciplinary discharge planning process, led by the attending physician.  Recommendations may be updated based on patient status, additional functional criteria and insurance authorization.   Follow Up Recommendations  Acute inpatient rehab (3hours/day)    Assistance Recommended at Discharge Frequent or constant Supervision/Assistance  Patient can return home with the following A little help with walking and/or transfers;A little help with bathing/dressing/bathroom;Assistance with cooking/housework;Direct supervision/assist for medications management;Direct supervision/assist for financial management;Assist for transportation;Help with stairs or ramp for entrance    Functional Status Assessment  Patient has had a recent  decline in their functional status and demonstrates the ability to make significant improvements in function in a reasonable and predictable amount of time.  Equipment Recommendations  Tub/shower seat;Other (comment) (RW)    Recommendations for Other Services Rehab consult     Precautions / Restrictions Precautions Precautions: Fall Restrictions Weight Bearing Restrictions: No      Mobility Bed Mobility Overal bed mobility: Needs Assistance Bed Mobility: Supine to Sit     Supine to sit: Min guard          Transfers Overall transfer level: Needs assistance Equipment used: Rolling walker (2 wheels) Transfers: Sit to/from Stand Sit to Stand: Min guard                  Balance Overall balance assessment: Needs assistance Sitting-balance support: Feet supported Sitting balance-Leahy Scale: Good Sitting balance - Comments: able to sit EOB for ADLs   Standing balance support: Single extremity supported, During functional activity Standing balance-Leahy Scale: Fair                             ADL either performed or assessed with clinical judgement   ADL Overall ADL's : Needs assistance/impaired Eating/Feeding: Independent;Sitting   Grooming: Set up;Sitting   Upper Body Bathing: Set up;Sitting   Lower Body Bathing: Min guard;Sit to/from stand   Upper Body Dressing : Set up;Sitting   Lower Body Dressing: Min guard;Sit to/from stand   Toilet Transfer: Min guard;Ambulation;Rolling walker (2 wheels)   Toileting- Clothing Manipulation and Hygiene: Supervision/safety;Sitting/lateral lean       Functional mobility during ADLs: Min guard;Rolling walker (2 wheels) General ADL Comments: min G for all OOB functional activity, noted to be a bit unsteady. No overt LOB.  Vision Baseline Vision/History: 0 No visual deficits Vision Assessment?: No apparent visual deficits Additional Comments: WFL            Pertinent Vitals/Pain Pain  Assessment Pain Assessment: Faces Faces Pain Scale: No hurt Pain Intervention(s): Monitored during session     Hand Dominance Right   Extremity/Trunk Assessment Upper Extremity Assessment Upper Extremity Assessment: Overall WFL for tasks assessed (coordination is a bit slowed)   Lower Extremity Assessment Lower Extremity Assessment: Defer to PT evaluation   Cervical / Trunk Assessment Cervical / Trunk Assessment: Normal   Communication Communication Communication: No difficulties   Cognition Arousal/Alertness: Awake/alert Behavior During Therapy: WFL for tasks assessed/performed Overall Cognitive Status: Impaired/Different from baseline Area of Impairment: Memory, Problem solving           Memory: Decreased short-term memory   Safety/Judgement: Decreased awareness of safety, Decreased awareness of deficits Awareness: Emergent Problem Solving: Slow processing General Comments: Overall WFL for tasks assessed. Does demonstrate STM deficits, does not recall walking with PT or the care team informing him of his stroke.     General Comments  VSS on RA, pt reported intermittent dizziness and became nauseous at the end of the session     [place] expects to be discharged to:: Other (Comment) Living Arrangements: Group Home Available Help at Discharge: Other (Comment) Type of Home: House Home Access: Ramped entrance     Home Layout: Two level;Able to live on main level with bedroom/bathroom Alternate Level Stairs-Number of Steps: flight   Bathroom Shower/Tub: Other (comment)   Bathroom Toilet: Standard     Home Equipment: None   Additional Comments: Pt states he has 1 friend at the home who can assist him as needed.  Lives With: Alone    Prior Functioning/Environment Prior Level of Function : Independent/Modified Independent               ADLs Comments: Works part time        OT Problem List: Decreased strength;Decreased range of  motion;Decreased activity tolerance;Impaired balance (sitting and/or standing);Decreased cognition;Decreased safety awareness      OT Treatment/Interventions: Self-care/ADL training;Therapeutic exercise;DME and/or AE instruction;Balance training;Patient/family education;Therapeutic activities    OT Goals(Current goals can be found in the care plan section) Acute Rehab OT Goals Patient Stated Goal: to feel better OT Goal Formulation: With patient Time For Goal Achievement: 01/03/22 Potential to Achieve Goals: Good ADL Goals Pt Will Perform Lower Body Dressing: Independently;sit to/from stand Pt Will Transfer to Toilet: with modified independence;ambulating Additional ADL Goal #1: Pt will indep complete 3 step way finding task Additional ADL Goal #2: Pt will indep complete IADL medication management task  OT Frequency: Min 2X/week       AM-PAC OT "6 Clicks" Daily Activity     Outcome Measure Help from another person eating meals?: None Help from another person taking care of personal grooming?: A Little Help from another person toileting, which includes using toliet, bedpan, or urinal?: A Little Help from another person bathing (including washing, rinsing, drying)?: A Little Help from another person to put on and taking off regular upper body clothing?: A Little Help from another person to put on and taking off regular lower body clothing?: A Little 6 Click Score: 19   End of Session Equipment Utilized During Treatment: Gait belt;Rolling walker (2 wheels) Nurse Communication: Mobility status  Activity Tolerance: Patient tolerated treatment well Patient left: in chair;with call bell/phone within reach;with chair alarm set  OT Visit Diagnosis: Unsteadiness on feet (  R26.81);Other abnormalities of gait and mobility (R26.89);Muscle weakness (generalized) (M62.81)                Time: 3225-6720 OT Time Calculation (min): 16 min Charges:  OT General Charges $OT Visit: 1 Visit OT  Evaluation $OT Eval Moderate Complexity: 1 Mod   Jalisha Enneking A Sabir Charters 12/20/2021, 4:31 PM

## 2021-12-20 NOTE — Progress Notes (Signed)
Inpatient Rehab Admissions Coordinator:  ? ?Per therapy recommendations,  patient was screened for CIR candidacy by Dim Meisinger, MS, CCC-SLP. At this time, Pt. Appears to be a a potential candidate for CIR. I will place   order for rehab consult per protocol for full assessment. Please contact me any with questions. ? ?Terran Klinke, MS, CCC-SLP ?Rehab Admissions Coordinator  ?[phone] (celll) ?[phone] (office) ? ?

## 2021-12-20 NOTE — Progress Notes (Signed)
Received patient in bed, alert and oriented. Informed consent signed and in chart.  Time tx completed:1250  HD treatment completed. Patient tolerated well. Fistula/Graft/HD catheter without signs and symptoms of complications. Patient transported back to the room, alert and orient and in no acute distress. Report given to bedside RN.  Total UF removed:400 ml  Medication given:none  Post HD VS:152/71. RH 66. R 16  Post HD weight: 53.4kg

## 2021-12-20 NOTE — Progress Notes (Signed)
OT Cancellation Note  Patient Details Name: Shawn Collins MRN: [id number] DOB: 04-29-1951   Cancelled Treatment:    Reason Eval/Treat Not Completed: Patient at procedure or test/ unavailable (HD; OT evaluation to f/u as schedule allows.)  Aldona Bar A Tyree Fluharty 12/20/2021, 9:12 AM

## 2021-12-20 NOTE — Evaluation (Signed)
Physical Therapy Evaluation Patient Details Name: Shawn Collins MRN: [id number] DOB: 1950/06/21 Today's Date: 12/20/2021  History of Present Illness  Pt is a 71 y.o. male who presented 12/18/21 with sudden N/V and dizziness. Pt administered TNK. 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 PMH: COPD, DM2, HTN, Hx of NSTEMI, CKD/ESRD, CAD   Clinical Impression  Pt presents with condition above and deficits mentioned below, see PT Problem List. PTA, he was IND without DME, living on the main level of a group home with a ramp entrance. The showers are upstairs, but he has not been showering due to his HD cath. Currently, pt demonstrates deficits in R hip flexion strength, L lower extremity coordination, midline orientation, balance, and activity tolerance. Pt with mildly ataxic gait and needing min-modA to prevent LOB while using a RW. He is at high risk for falls. I anticipate pt will progress quickly and has the potential to achieve a mod I level for mobility, and he is motivated to participate and improve thus recommending intensive therapy in the AIR setting to try to progress pt to a mod I level prior to d/c so he can safely return to his group home. Will continue to follow acutely.     Recommendations for follow up therapy are one component of a multi-disciplinary discharge planning process, led by the attending physician.  Recommendations may be updated based on patient status, additional functional criteria and insurance authorization.  Follow Up Recommendations Acute inpatient rehab (3hours/day) (pending progress)      Assistance Recommended at Discharge Frequent or constant Supervision/Assistance  Patient can return home  with the following  A little help with walking and/or transfers;A little help with bathing/dressing/bathroom;Assistance with cooking/housework;Direct supervision/assist for medications management;Direct supervision/assist for financial management;Assist for transportation;Help with stairs or ramp for entrance    Equipment Recommendations Rolling walker (2 wheels);BSC/3in1  Recommendations for Other Services  Rehab consult    Functional Status Assessment Patient has had a recent decline in their functional status and demonstrates the ability to make significant improvements in function in a reasonable and predictable amount of time.     Precautions / Restrictions Precautions Precautions: Fall Restrictions Weight Bearing Restrictions: No      Mobility  Bed Mobility Overal bed mobility: Needs Assistance Bed Mobility: Supine to Sit     Supine to sit: Min guard, HOB elevated     General bed mobility comments: Extra time and cues to fixate eyes on object to reduce nausea with movement, min guard for safety    Transfers Overall transfer level: Needs assistance Equipment used: Rolling walker (2 wheels) Transfers: Sit to/from Stand Sit to Stand: Min assist           General transfer comment: MinA to steady with transfer to stand. Attempted to cue pt to push up from bed but pt pulling up on RW. Cues for gaze fixation during transfer.    Ambulation/Gait Ambulation/Gait assistance: Min assist, Mod assist Gait Distance (Feet): 135 Feet Assistive device: Rolling walker (2 wheels) Gait Pattern/deviations: Step-through pattern, Decreased stride length, Staggering left, Ataxic Gait velocity: reduced Gait velocity interpretation: <1.31 ft/sec, indicative of household ambulator   General Gait Details: Pt with mildly ataxic gait and L trunk lateral lean resulting in x3 LOB bouts  to L, requiring min-modA to recover. Tactile and verbal cues provided to correct trunk position and improve  midline alignment, with good carryover noted. Needs cues to remain within RW  Stairs            Wheelchair Mobility    Modified Rankin (Stroke Patients Only) Modified Rankin (Stroke Patients Only) Pre-Morbid Rankin Score: No symptoms Modified Rankin: Moderately severe disability     Balance Overall balance assessment: Needs assistance Sitting-balance support: Single extremity supported, Bilateral upper extremity supported, Feet supported Sitting balance-Leahy Scale: Poor Sitting balance - Comments: Leans to L onto L elbow often, min guard for sitting balance Postural control: Left lateral lean Standing balance support: Bilateral upper extremity supported, During functional activity, Reliant on assistive device for balance Standing balance-Leahy Scale: Poor Standing balance comment: Reliant on RW and up to min-modA, L bias                             Pertinent Vitals/Pain Pain Assessment Pain Assessment: Faces Faces Pain Scale: No hurt Pain Intervention(s): Monitored during session    Home Living Family/patient expects to be discharged to:: Other (Comment) [employer]) Living Arrangements: Group Home Available Help at Discharge: Other (Comment) (supposed to have a RN assigned to him there) Type of Home: House Home Access: Ramped entrance     Alternate Level Stairs-Number of Steps: flight Home Layout: Two level;Able to live on main level with bedroom/bathroom (stays on main level, shower is upstairs but has not been showering due to HD cath) Home Equipment: None      Prior Function Prior Level of Function : Independent/Modified Independent                     Hand Dominance   Dominant Hand: Right    Extremity/Trunk Assessment   Upper Extremity Assessment Upper Extremity Assessment: Defer to OT evaluation    Lower Extremity Assessment Lower Extremity Assessment: LLE deficits/detail;RLE deficits/detail RLE Deficits / Details: MMT  scores of 4 hip flexion, 5 knee extension 4+ ankle dorsiflexion; sensation intact; coordination intact RLE Sensation: WNL RLE Coordination: WNL LLE Deficits / Details: MMT scores of 5 hip flexion, 5 knee extension, 4+ ankle dorsiflexion; sensation intact; dysdiadochokinesia noted LLE Sensation: WNL LLE Coordination: decreased fine motor;decreased gross motor    Cervical / Trunk Assessment Cervical / Trunk Assessment: Normal  Communication   Communication: No difficulties  Cognition Arousal/Alertness: Awake/alert Behavior During Therapy: Flat affect Overall Cognitive Status: Impaired/Different from baseline Area of Impairment: Orientation, Memory, Safety/judgement, Awareness                 Orientation Level: Disoriented to, Time   Memory: Decreased short-term memory   Safety/Judgement: Decreased awareness of deficits Awareness: Emergent   General Comments: Pt reporting today was Sunday and unsure of exact date in July. Even though re-oriented that today was Monday, pt still forgetting and reporting today was Sunday end of session again. Decreased awareness of his deficits, needing cues to correct L lateral lean.        General Comments General comments (skin integrity, edema, etc.): VSS on RA; educated pt on gaze fixation to improve nausea (nystagmus noted) with success noted during session    Exercises     Assessment/Plan    PT Assessment Patient needs continued PT services  PT Problem List Decreased strength;Decreased activity tolerance;Decreased balance;Decreased mobility;Decreased coordination;Decreased cognition;Decreased knowledge of use of DME       PT  Treatment Interventions DME instruction;Gait training;Stair training;Functional mobility training;Therapeutic activities;Therapeutic exercise;Balance training;Neuromuscular re-education;Cognitive remediation;Patient/family education    PT Goals (Current goals can be found in the Care Plan section)  Acute Rehab  PT Goals Patient Stated Goal: to find another home PT Goal Formulation: With patient Time For Goal Achievement: 01/03/22 Potential to Achieve Goals: Good    Frequency Min 4X/week     Co-evaluation               AM-PAC PT "6 Clicks" Mobility  Outcome Measure Help needed turning from your back to your side while in a flat bed without using bedrails?: A Little Help needed moving from lying on your back to sitting on the side of a flat bed without using bedrails?: A Little Help needed moving to and from a bed to a chair (including a wheelchair)?: A Little Help needed standing up from a chair using your arms (e.g., wheelchair or bedside chair)?: A Little Help needed to walk in hospital room?: A Lot Help needed climbing 3-5 steps with a railing? : Total 6 Click Score: 15    End of Session Equipment Utilized During Treatment: Gait belt Activity Tolerance: Patient tolerated treatment well Patient left: in chair;with call bell/phone within reach;with chair alarm set Nurse Communication: Mobility status PT Visit Diagnosis: Unsteadiness on feet (R26.81);Other abnormalities of gait and mobility (R26.89);Other symptoms and signs involving the nervous system (R29.898);Dizziness and giddiness (R42);Difficulty in walking, not elsewhere classified (R26.2);Ataxic gait (R26.0);Muscle weakness (generalized) (M62.81)    Time: 1308-6578 PT Time Calculation (min) (ACUTE ONLY): 25 min   Charges:   PT Evaluation $PT Eval Moderate Complexity: 1 Mod PT Treatments $Gait Training: 8-22 mins        Moishe Spice, PT, DPT Acute Rehabilitation Services  Office: [phone]   Orvan Falconer 12/20/2021, 8:37 AM

## 2021-12-20 NOTE — Progress Notes (Addendum)
STROKE TEAM PROGRESS NOTE   INTERVAL HISTORY PT and OT evaluation pending on HD and dialysis now. No family at the bedside.  Sleeping but easily arousable. Still having some nausea but no more dizziness. Resume home BP medications. BP systolic 696E K+ 5.0, EKG, QTC<500 Overall much improvement than on arrival.   Vitals:   12/20/21 0500 12/20/21 0600 12/20/21 0700 12/20/21 0800  BP: (!) 146/72 (!) 149/66 (!) 147/73   Pulse: 63 64 63   Resp: 12 13 13    Temp:    98.1 F (36.7 C)  TempSrc:    Oral  SpO2: 98% 97% 98%   Weight:      Height:       CBC:  Recent Labs  Lab 12/18/21 1811 12/18/21 1822 12/20/21 0238  WBC 6.8  --  8.4  NEUTROABS 4.9  --   --   HGB 14.6 16.0 13.5  HCT 43.9 47.0 40.1  MCV 96.3  --  96.4  PLT 236  --  952    Basic Metabolic Panel:  Recent Labs  Lab 12/18/21 1811 12/18/21 1822 12/19/21 0559 12/20/21 0238  NA 140 140  --  137  K 4.0 4.0  --  5.0  CL 102 103  --  101  CO2 22  --   --  20*  GLUCOSE 133* 136*  --  112*  BUN 25* 27*  --  51*  CREATININE 7.57* 8.20*  --  10.24*  CALCIUM 8.7*  --   --  8.2*  MG  --   --  2.2  --     Lipid Panel:  Recent Labs  Lab 12/19/21 0559  CHOL 160  TRIG 62  HDL 44  CHOLHDL 3.6  VLDL 12  LDLCALC 104*    HgbA1c:  Recent Labs  Lab 12/18/21 1950  HGBA1C 5.2    Alcohol Level  Recent Labs  Lab 12/18/21 1950  ETH <10    ________________________________________________________________  IMAGING past 48hrs  MR BRAIN WO CONTRAST  Result Date: 12/19/2021 CLINICAL DATA:  Provided history: Neuro deficit, acute, stroke suspected. EXAM: MRI HEAD WITHOUT CONTRAST TECHNIQUE: Multiplanar, multiecho pulse sequences of the brain and surrounding structures were obtained without intravenous contrast. COMPARISON:  CT angiogram head/neck 12/18/2021. Non-contrast head CT 12/18/2021. FINDINGS: Intermittently motion degraded examination (with up to moderate motion degradation of the acquired sequences). Brain:  Moderate generalized cerebral atrophy. Comparatively mild cerebellar atrophy. 6 mm acute infarct at the junction of the posterior pons and middle cerebellar peduncle on the right (series 2, image 15) (series 3, image 17). 3 mm focus of subtle diffusion weighted signal abnormality within the posterior limb of left internal capsule, likely reflecting an additional small acute infarct (for instance as seen on series 2, image 29) (series 3, image 20). Chronic small-vessel infarcts within bilateral cerebral hemispheric white matter and within the left basal ganglia/internal capsule. Background moderate multifocal T2 FLAIR hyperintense signal abnormality within the cerebral white matter, nonspecific but compatible chronic small vessel ischemic disease. Tiny T2 hyperintense focus within the left middle cerebellar peduncle, which may reflect a prominent perivascular space or chronic lacunar infarct (series 6, image 8). Suspected tiny chronic infarct within the inferior right cerebellar hemisphere (series 6, image 5). Chronic microhemorrhage within the medial left cerebellar hemisphere. No evidence of an intracranial mass. No extra-axial fluid collection. No midline shift. Vascular: Signal abnormality within the intracranial left vertebral artery compatible with vessel occlusion (as was demonstrated on the CTA head/neck 12/18/2021). Flow voids preserved elsewhere  within the proximal large arterial vessels. Skull and upper cervical spine: No focal suspicious marrow lesion. Incompletely assessed cervical spondylosis. Sinuses/Orbits: Minimal mucosal thickening within the bilateral frontal and ethmoid sinuses. IMPRESSION: 1. Motion degraded examination. 2. 6 mm acute infarct at the junction of the posterior pons and middle cerebellar peduncle on the right. 3. Probable additional 3 mm acute infarct within the posterior limb of left internal capsule. 4. Background cerebral atrophy, chronic small vessel ischemic disease and chronic  infarcts as detailed. 5. Signal abnormality within the intracranial left vertebral artery compatible with vessel occlusion (as was demonstrated on the CTA head/neck of 12/18/2021). Electronically Signed   By: Kellie Simmering D.O.   On: 12/19/2021 18:21   ECHOCARDIOGRAM COMPLETE  Result Date: 12/19/2021    ECHOCARDIOGRAM REPORT   Patient Name:   Shawn Collins Date of Exam: 12/19/2021 Medical Rec #:  [id number]     Height:       68.0 in Accession #:    [id number]    Weight:       121.5 lb Date of Birth:  [birth year]     BSA:          1.653 m Patient Age:    71 years      BP:           156/81 mmHg Patient Gender: M             HR:           64 bpm. Exam Location:  Inpatient Procedure: 2D Echo, Cardiac Doppler and Color Doppler Indications:    Stroke I63.9  History:        Patient has prior history of Echocardiogram examinations, most                 recent 12/01/2017. CAD, Prior CABG, COPD; Risk Factors:Diabetes,                 Hypertension and Former Smoker. End stage renal disease.  Sonographer:    Darlina Sicilian RDCS Referring Phys: [id number] [place]  1. Mid cavitary obstruction with max instantaneous gradient of 32 mmHg. Flow acceration at the basal septum and mid cavity.. Left ventricular ejection fraction, by estimation, is >75%. The left ventricle has hyperdynamic function. The left ventricle has  no regional wall motion abnormalities. Left ventricular diastolic parameters are indeterminate.  2. Right ventricular systolic function is hyperdynamic. The right ventricular size is normal. There is normal pulmonary artery systolic pressure. The estimated right ventricular systolic pressure is 53.2 mmHg.  3. Mitral valve has a mild post inflammatory appearance with leaflet excursion. The mitral valve is grossly normal. Trivial mitral valve regurgitation. No evidence of mitral stenosis.  4. The aortic valve is abnormal. There is moderate calcification of the aortic valve. Aortic valve regurgitation is  not visualized. Aortic valve sclerosis/calcification is present, without any evidence of aortic stenosis.  5. The inferior vena cava is normal in size with greater than 50% respiratory variability, suggesting right atrial pressure of 3 mmHg. Conclusion(s)/Recommendation(s): No intracardiac source of embolism detected on this transthoracic study. Consider a transesophageal echocardiogram to exclude cardiac source of embolism if clinically indicated. FINDINGS  Left Ventricle: Mid cavitary obstruction with max instantaneous gradient of 32 mmHg. Flow acceration at the basal septum and mid cavity. Left ventricular ejection fraction, by estimation, is >75%. The left ventricle has hyperdynamic function. The left ventricle has no regional wall motion abnormalities. The left ventricular internal cavity size was normal in size. There  is no left ventricular hypertrophy. Left ventricular diastolic parameters are indeterminate. Right Ventricle: The right ventricular size is normal. No increase in right ventricular wall thickness. Right ventricular systolic function is hyperdynamic. There is normal pulmonary artery systolic pressure. The tricuspid regurgitant velocity is 2.28 m/s, and with an assumed right atrial pressure of 3 mmHg, the estimated right ventricular systolic pressure is 27.0 mmHg. Left Atrium: Left atrial size was normal in size. Right Atrium: Right atrial size was normal in size. Pericardium: There is no evidence of pericardial effusion. Mitral Valve: Mitral valve has a mild post inflammatory appearance with leaflet excursion. The mitral valve is grossly normal. Trivial mitral valve regurgitation. No evidence of mitral valve stenosis. Tricuspid Valve: The tricuspid valve is normal in structure. Tricuspid valve regurgitation is mild . No evidence of tricuspid stenosis. Aortic Valve: The aortic valve is abnormal. There is moderate calcification of the aortic valve. Aortic valve regurgitation is not visualized.  Aortic valve sclerosis/calcification is present, without any evidence of aortic stenosis. Pulmonic Valve: The pulmonic valve was normal in structure. Pulmonic valve regurgitation is not visualized. No evidence of pulmonic stenosis. Aorta: The aortic root is normal in size and structure. Venous: The inferior vena cava is normal in size with greater than 50% respiratory variability, suggesting right atrial pressure of 3 mmHg. IAS/Shunts: The atrial septum is grossly normal.  LEFT VENTRICLE PLAX 2D LVIDd:         3.70 cm     Diastology LVIDs:         2.10 cm     LV e' medial:    4.99 cm/s LV PW:         1.00 cm     LV E/e' medial:  16.0 LV IVS:        0.90 cm     LV e' lateral:   6.13 cm/s LVOT diam:     2.00 cm     LV E/e' lateral: 13.0 LV SV:         74 LV SV Index:   45 LVOT Area:     3.14 cm  LV Volumes (MOD) LV vol d, MOD A4C: 94.0 ml LV vol s, MOD A4C: 10.5 ml LV SV MOD A4C:     94.0 ml RIGHT VENTRICLE RV S prime:     14.00 cm/s TAPSE (M-mode): 1.7 cm LEFT ATRIUM             Index        RIGHT ATRIUM          Index LA diam:        2.40 cm 1.45 cm/m   RA Area:     9.57 cm LA Vol (A2C):   37.1 ml 22.44 ml/m  RA Volume:   17.10 ml 10.34 ml/m LA Vol (A4C):   32.1 ml 19.41 ml/m LA Biplane Vol: 36.0 ml 21.77 ml/m  AORTIC VALVE LVOT Vmax:   112.00 cm/s LVOT Vmean:  75.400 cm/s LVOT VTI:    0.235 m  AORTA Ao Root diam: 2.90 cm MITRAL VALVE               TRICUSPID VALVE MV Area (PHT): 2.15 cm    TR Peak grad:   20.8 mmHg MV Decel Time: 353 msec    TR Vmax:        228.00 cm/s MV E velocity: 79.70 cm/s MV A velocity: 83.80 cm/s  SHUNTS MV E/A ratio:  0.95        Systemic VTI:  0.24 m                            Systemic Diam: 2.00 cm Cherlynn Kaiser MD Electronically signed by Cherlynn Kaiser MD Signature Date/Time: 12/19/2021/12:49:12 PM    Final    CT ANGIO HEAD NECK W WO CM (CODE STROKE)  Result Date: 12/18/2021 CLINICAL DATA:  Provided history: Neuro deficit, acute, stroke suspected. EXAM: CT ANGIOGRAPHY HEAD  AND NECK TECHNIQUE: Multidetector CT imaging of the head and neck was performed using the standard protocol during bolus administration of intravenous contrast. Multiplanar CT image reconstructions and MIPs were obtained to evaluate the vascular anatomy. Carotid stenosis measurements (when applicable) are obtained utilizing NASCET criteria, using the distal internal carotid diameter as the denominator. RADIATION DOSE REDUCTION: This exam was performed according to the departmental dose-optimization program which includes automated exposure control, adjustment of the mA and/or kV according to patient size and/or use of iterative reconstruction technique. CONTRAST:  66mL OMNIPAQUE IOHEXOL 350 MG/ML SOLN COMPARISON:  Noncontrast head CT performed earlier today 12/18/2021. FINDINGS: CTA NECK FINDINGS Aortic arch: Standard aortic branching. The visualized aortic arch is normal in caliber. Minimal atherosclerotic plaque within the proximal left subclavian artery. No hemodynamically significant innominate or proximal subclavian artery stenosis. No hemodynamically significant innominate or proximal subclavian artery stenosis. Right carotid system: CCA and ICA patent within the neck without hemodynamically significant stenosis (50% or greater). Mild atherosclerotic plaque within the proximal ICA. Left carotid system: CCA and ICA patent within the neck without stenosis or significant atherosclerotic disease. Vertebral arteries: The right vertebral artery is patent within the neck. Mild to moderate atherosclerotic narrowing at the origin of this vessel. There is also mild atherosclerotic narrowing at the level of the skull base. Only intermittent enhancement is seen within the left vertebral artery within the neck, and this vessel is likely functionally occluded. Skeleton: Cervical spondylosis. No acute fracture or aggressive osseous lesion. Other neck: No neck mass or cervical lymphadenopathy. Upper chest: Prior median  sternotomy. No consolidation within the imaged lung apices. Review of the MIP images confirms the above findings CTA HEAD FINDINGS Anterior circulation: The intracranial internal carotid arteries are patent. Atherosclerotic plaque within both vessels. Moderate stenosis within the supraclinoid right ICA. No more than mild stenosis elsewhere within the cranial ICAs. The M1 middle cerebral arteries are patent. Atherosclerotic irregularity of the M2 and more distal MCA vessels, bilaterally. Most notably, there is a moderate stenosis within an inferior division proximal right M2 MCA vessel (series 8, image 18). The anterior cerebral arteries are patent. Posterior circulation: The intracranial right vertebral artery is patent. Atherosclerotic plaque within this vessel with up to moderate stenosis. Only intermittent enhancement is seen within the intracranial left vertebral artery and this vessel is likely functionally occluded. At least some enhancement is seen within the proximal left PICA. The basilar artery is patent. The posterior cerebral arteries are patent. Hypoplastic left P1 segment with sizable left posterior communicating artery. A sizable right posterior communicating artery is also present. Moderate stenosis within the left P2 segment. Venous sinuses: Within the limitations of contrast timing, no convincing thrombus. Anatomic variants: As described. Review of the MIP images confirms the above findings These results were called by telephone at the time of interpretation on 12/18/2021 at 7:23 pm to provider [facility] , who verbally acknowledged these results. IMPRESSION: CTA neck: 1. Only intermittent enhancement is seen within the cervical left vertebral artery, and this vessel is likely functionally  occluded. 2. The right vertebral artery is patent. Mild-to-moderate atherosclerotic narrowing at the origin of this vessel. 3. The common carotid and internal carotid arteries are patent within the neck  without hemodynamically significant stenosis. Mild atherosclerotic plaque about the right carotid bifurcation and within the proximal right ICA. 4. Cervical spondylosis. CTA head: 1. Only intermittent enhancement is seen within the intracranial left vertebral artery and this vessel is likely functionally occluded. At least some enhancement is seen within the proximal left PICA. 2. Additional intracranial atherosclerotic disease with multifocal stenoses, as outlined and most notably as follows. Moderate stenosis within the supraclinoid right ICA. Moderate stenosis within an inferior division proximal M2 right MCA vessel. Electronically Signed   By: Kellie Simmering D.O.   On: 12/18/2021 19:25   CT HEAD CODE STROKE WO CONTRAST  Result Date: 12/18/2021 CLINICAL DATA:  Code stroke. Neuro deficit, acute, stroke suspected. EXAM: CT HEAD WITHOUT CONTRAST TECHNIQUE: Contiguous axial images were obtained from the base of the skull through the vertex without intravenous contrast. RADIATION DOSE REDUCTION: This exam was performed according to the departmental dose-optimization program which includes automated exposure control, adjustment of the mA and/or kV according to patient size and/or use of iterative reconstruction technique. COMPARISON:  No pertinent prior exams available for comparison. FINDINGS: Brain: Moderate generalized cerebral atrophy.  Mild cerebellar atrophy. Chronic appearing lacunar infarct within the right frontoparietal white matter (series 4, image 22). Age-indeterminate lacunar infarct within the posterior limb of left internal capsule (series 4, image 15). Chronic lacunar infarcts elsewhere within the left internal capsule and lentiform nucleus. Background mild-to-moderate patchy and ill-defined hypoattenuation within the cerebral white matter, nonspecific but compatible with chronic small vessel disease. There is no acute intracranial hemorrhage. No demarcated cortical infarct. No extra-axial fluid  collection. No evidence of an intracranial mass. No midline shift. Vascular: No hyperdense vessel.  Atherosclerotic calcifications. Skull: No fracture or aggressive osseous lesion. Sinuses/Orbits: No mass or acute finding within the imaged orbits. Minimal mucosal thickening scattered within the paranasal sinuses at the imaged levels. ASPECTS ([place] Stroke Program Early CT Score) - Ganglionic level infarction (caudate, lentiform nuclei, internal capsule, insula, M1-M3 cortex): 6 - Supraganglionic infarction (M4-M6 cortex): 3 Total score (0-10 with 10 being normal): 9 Age-indeterminate lacunar infarct within the posterior limb of left internal capsule. These results were communicated to Dr. Curly [place]ores At 6:34 pmon 7/22/2023by text page via the [facility] messaging system. IMPRESSION: Age-indeterminate lacunar infarct within the posterior limb of left internal capsule. ASPECTS is 9. Background parenchymal atrophy, chronic small vessel ischemic disease and chronic lacunar infarcts, as described. Electronically Signed   By: Kellie Simmering D.O.   On: 12/18/2021 18:34   DG Chest Port 1 View  Result Date: 12/18/2021 CLINICAL DATA:  Vomiting and hypertension. EXAM: PORTABLE CHEST 1 VIEW COMPARISON:  01/09/2017 FINDINGS: Postoperative changes in the mediastinum. Right central venous catheter with tip over the low SVC region. Heart size and pulmonary vascularity are normal. Lungs are clear. No pleural effusions. No pneumothorax. Mediastinal contours appear intact. IMPRESSION: No active disease. Electronically Signed   By: Lucienne Capers M.D.   On: 12/18/2021 18:19   ________________________________________________________________  PHYSICAL EXAM  Neurological Exam: Mental status/Cognition: Alert, oriented x4  Speech/language: Fluent, comprehension intact, object naming intact, repetition intact. Speech clear, no dysarthria  Cranial nerves:   CN II Pupils equal and reactive to light, no VF deficits    CN III,IV,VI EOM  intact, no gaze preference or deviation, nystagmus bil L>R. denies diplopia.  No ptosis  CN V normal sensation in V1, V2, and V3 segments bilaterally    CN VII no asymmetry, no nasolabial fold flattening    CN VIII normal hearing to speech    CN IX & X normal palatal elevation, no uvular deviation    CN XI 5/5 head turn and 5/5 shoulder shrug bilaterally    CN XII midline tongue protrusion      Motor:  Muscle bulk and tone: normal Pronator drift: none Tremor: none  Strength: RUE:  Grip 5/5    biceps    5/5   triceps  5/5      LUE:  Grip 5/5    biceps  5/5   triceps  5/5 RLE:  Thigh 5/5    knee   5/5    plantar flexion  5/5   dorsiflexion  5/5        LLE:  Thigh 5/5    knee   5/5    plantar flexion  5/5   dorsiflexion  5/5   Reflexes: deferred   Sensation: FACE: normal,  ARMS: normal bil,  LEGS: normal bil   Coordination/Complex Motor:  - Finger to Nose intact BL - Heel to shin intact BL - Rapid alternating movement are normal - Gait: deferred, pt in bed. ________________________________________________________________  ASSESSMENT/PLAN Shawn Collins is a 71 y.o. male with history of COPD, DM2, HTN, Hx of NSTEMI, CKD/ESRD, CAD presenting with sudden N/V and dizziness. S/p TNK. Stable for transfer out of ICU. 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  Moderate stenosis within the supraclinoid right ICA. Moderate stenosis within an inferior division proximal M2 right MCA vessel. 24hr MRI Brain 7/23:  6 mm acute infarct at the junction of the posterior pons and middle cerebellar peduncle on the right. 3 mm later subacute infarct within the PLIC 2D Echo EF > 43% LDL  104 A1C 5.2 UDS Negative VTE prophylaxis - SCD's aspirin 81 mg daily prior to admission, now on ASA and plavix DAPT for 3 weeks and then plavix alone.  PT/OT: CIR consult placed Deposition - pending  Hypertension Home meds:  amlodipine 10mg /d, carvedilol 6.25mg  bid, furosamide 40mg /d, losartan 25mg /d stable on the high and now off clevidipne IV Currently on amlodipine 10, hydralazine 25->50 tid, losartan 25 daily Long-term BP goal normotensive  Hyperlipidemia Home meds:  atorvastatin 20mg /d  LDL 104, goal < 70 Now on atorvastatin 40mg  Continue statin at discharge  Diabetes type II  Well Controlled per A1C result Home meds:  glargine insulin 10u p.m. HgbA1c 5.2, goal < 7.0 CBGs:   Sliding Scale Insulin Close PCP follow-up for DM control  Other Stroke Risk Factors Advanced Age >/= 33  COPD/Emphysema: former Cigarette smoker Family hx stroke (father) Coronary artery disease s/p CABG  Other active problems ESRD on HD HD done 7/24 inpatient Procedure 7/25 with [facility] for AVF QT prolongation, repeat EKG, now QTc within range.  Hospital day # 2  Patient seen and examined by NP/APP with MD. MD to update note as needed.   Janine Ores, DNP, FNP-BC [facility] Pager: [phone]  ATTENDING NOTE: I reviewed above note and agree with the assessment and plan. Pt was seen and examined.  No family at bedside.  Patient initially had at bedside for HD today.  Patient awake alert, orientated x3, left gaze nystagmus improving, right gaze nystagmus resolved.  Moving all extremities.  MRI overnight showed small right cerebellar peduncle acute infarct, and likely late subacute left PLIC infarct. Now on DAPT for 3 weeks and then plavix alone. Continue statin. BP still on the high end, will increase hydralazine dose.  Nephrology on board, on HD.  VVS planning for AVF procedure in am. Small amount of heparin during the procedure should be fine from stroke strandpoint given  small size of stroke.  PT/OT recommend CIR.  For detailed assessment and plan, please refer to above/below as I have made changes wherever appropriate.   Rosalin Hawking, MD PhD Stroke Neurology 12/20/2021 5:12 PM     To contact Stroke Continuity provider, please refer to [email or web address] After hours, contact General Neurology

## 2021-12-20 NOTE — Evaluation (Signed)
Speech Language Pathology Evaluation Patient Details Name: Shawn Collins MRN: [id number] DOB: 1950-06-12 Today's Date: 12/20/2021 Time: 1017-5102 SLP Time Calculation (min) (ACUTE ONLY): 25 min  Problem List:  Patient Active Problem List   Diagnosis Date Noted   Stroke determined by clinical assessment ([place]) 12/18/2021   ESRD (end stage renal disease) on dialysis ([place]) 02/23/2021   DOE (dyspnea on exertion) 11/22/2017   Hyperlipidemia LDL goal <70 04/17/2017   CAD (coronary artery disease), native coronary artery    S/P CABG x 4 10/26/2016   Acute kidney injury superimposed on CKD (HCC)    Abnormal nuclear stress test    Nausea & vomiting 10/16/2016   NSTEMI (non-ST elevated myocardial infarction) ([place]) 10/16/2016   CKD (chronic kidney disease), stage III ([place]) 10/16/2016   COPD exacerbation ([place]) 09/10/2014   COPD (chronic obstructive pulmonary disease) ([place]) 09/10/2014   Viral gastroenteritis 09/10/2014   Hypoxia 09/10/2014   Dizzy-->?Orthostatic? 09/10/2014   Diabetes mellitus without complication ([place]) 58/52/7782   Hypertension 09/10/2014   Emphysema lung ([place]) 09/10/2014   Past Medical History:  Past Medical History:  Diagnosis Date   CAD (coronary artery disease), native coronary artery    s/p NSTEMI with cath showing severe 3VD s/p  CABG x 3 with LIMA to LAD, SVG to OM1/OM2 and SVG D1 by Dr. Servando Snare on 10/26/16.   CKD (chronic kidney disease) stage 3, GFR 30-59 ml/min (HCC) 09/2016   COPD (chronic obstructive pulmonary disease) ([place]) 09/10/2014   Diabetes mellitus without complication (HCC)    Type II   Dizzy-->?Orthostatic? 09/10/2014   Emphysema lung ([place])    ESRD (end stage renal disease) ([place])    Hyperlipidemia LDL goal <70 04/17/2017   Hypertension    Hypoxia 09/10/2014   Past Surgical History:  Past Surgical History:  Procedure Laterality Date   CORONARY ARTERY BYPASS GRAFT N/A 10/26/2016   Procedure: CORONARY ARTERY BYPASS GRAFTING (CABG) x 4 USING LEFT  INTERNAL MAMMARY ARTERY TO LAD AND ENDOSCOPICALLY HARVESTED GREATER SAPHENOUS VEIN TO OM 1, 2 AND TO DIAG;  Surgeon: Grace Isaac, MD;  Location: [facility];  Service: Open Heart Surgery;  Laterality: N/A;   ENDOVEIN HARVEST OF GREATER SAPHENOUS VEIN Right 10/26/2016   Procedure: ENDOVEIN HARVEST OF GREATER SAPHENOUS VEIN;  Surgeon: Grace Isaac, MD;  Location: [facility];  Service: Open Heart Surgery;  Laterality: Right;   IR THORACENTESIS ASP PLEURAL SPACE W/IMG GUIDE  11/03/2016   LEFT HEART CATH AND CORONARY ANGIOGRAPHY N/A 10/21/2016   Procedure: Left Heart Cath and Coronary Angiography;  Surgeon: Martinique, Peter M, MD;  Location: [facility];  Service: Cardiovascular;  Laterality: N/A;   TEE WITHOUT CARDIOVERSION N/A 10/26/2016   Procedure: TRANSESOPHAGEAL ECHOCARDIOGRAM (TEE);  Surgeon: Grace Isaac, MD;  Location: [facility];  Service: Open Heart Surgery;  Laterality: N/A;   HPI:  71 year old male with a history of diabetes, hypertension, COPD, end-stage renal disease who started on dialysis 2 months ago who currently has a tunneling catheter in the right chest, prior CAD status post CABG x3 who is presenting today with EMS for sudden onset of nausea vomiting. Imaging revealed an age-indeterminate lacunar infarct within the posterior limb of left internal capsule   Assessment / Plan / Recommendation Clinical Impression  Pt demonstrates mild cognitive impairment that is likely baseline given pts reports of some memory impairment and struggle to take his medication regularly. SLP observed decreased working memory and poor storage and retrieval of novel information. SLP showed pt how to use  an alarm on his phone as a reminder to take meds, but pt needed assist to utilize and did not seem interested. Pt would benefit from f/u SLP services, potentially AIR.    SLP Assessment  SLP Recommendation/Assessment: All further Speech Lanaguage Pathology  needs can be addressed in the next venue of  care SLP Visit Diagnosis: Cognitive communication deficit (R41.841)    Recommendations for follow up therapy are one component of a multi-disciplinary discharge planning process, led by the attending physician.  Recommendations may be updated based on patient status, additional functional criteria and insurance authorization.    Follow Up Recommendations       Assistance Recommended at Discharge     Functional Status Assessment    Frequency and Duration           SLP Evaluation Cognition  Overall Cognitive Status: Impaired/Different from baseline Arousal/Alertness: Awake/alert Orientation Level: Oriented to person;Oriented to place;Oriented to time;Oriented to situation Attention: Focused;Sustained;Selective Focused Attention: Appears intact Sustained Attention: Appears intact Selective Attention: Appears intact Memory: Impaired Memory Impairment: Storage deficit;Retrieval deficit Awareness: Appears intact Problem Solving: Impaired Problem Solving Impairment: Verbal basic Executive Function: Reasoning       Comprehension  Auditory Comprehension Overall Auditory Comprehension: Appears within functional limits for tasks assessed    Expression Verbal Expression Overall Verbal Expression: Appears within functional limits for tasks assessed   Oral / Motor  Oral Motor/Sensory Function Overall Oral Motor/Sensory Function: Within functional limits Motor Speech Overall Motor Speech: Appears within functional limits for tasks assessed            Denyce Harr, Katherene Ponto 12/20/2021, 1:33 PM

## 2021-12-21 ENCOUNTER — Other Ambulatory Visit: Payer: Self-pay

## 2021-12-21 ENCOUNTER — Inpatient Hospital Stay (HOSPITAL_COMMUNITY): Payer: Medicare Other

## 2021-12-21 ENCOUNTER — Ambulatory Visit: Admit: 2021-12-21 | Payer: Medicare Other | Admitting: Vascular Surgery

## 2021-12-21 ENCOUNTER — Ambulatory Visit (HOSPITAL_COMMUNITY): Admission: RE | Admit: 2021-12-21 | Payer: Medicare Other | Source: Home / Self Care | Admitting: Vascular Surgery

## 2021-12-21 ENCOUNTER — Encounter (HOSPITAL_COMMUNITY): Payer: Self-pay | Admitting: Neurology

## 2021-12-21 ENCOUNTER — Encounter (HOSPITAL_COMMUNITY)
Admission: EM | Disposition: A | Discharge status: Skilled Nursing Facility | Payer: Self-pay | Source: Home / Self Care | Attending: Neurology

## 2021-12-21 DIAGNOSIS — I120 Hypertensive chronic kidney disease with stage 5 chronic kidney disease or end stage renal disease: Secondary | ICD-10-CM | POA: Diagnosis not present

## 2021-12-21 DIAGNOSIS — N186 End stage renal disease: Secondary | ICD-10-CM | POA: Diagnosis not present

## 2021-12-21 DIAGNOSIS — I2510 Atherosclerotic heart disease of native coronary artery without angina pectoris: Secondary | ICD-10-CM

## 2021-12-21 DIAGNOSIS — Z992 Dependence on renal dialysis: Secondary | ICD-10-CM | POA: Diagnosis not present

## 2021-12-21 DIAGNOSIS — N185 Chronic kidney disease, stage 5: Secondary | ICD-10-CM | POA: Diagnosis not present

## 2021-12-21 DIAGNOSIS — I160 Hypertensive urgency: Secondary | ICD-10-CM | POA: Diagnosis not present

## 2021-12-21 DIAGNOSIS — E1122 Type 2 diabetes mellitus with diabetic chronic kidney disease: Secondary | ICD-10-CM | POA: Diagnosis not present

## 2021-12-21 DIAGNOSIS — I6350 Cerebral infarction due to unspecified occlusion or stenosis of unspecified cerebral artery: Secondary | ICD-10-CM | POA: Diagnosis not present

## 2021-12-21 HISTORY — PX: AV FISTULA PLACEMENT: SHX1204

## 2021-12-21 LAB — POCT I-STAT, CHEM 8
BUN: 35 mg/dL — ABNORMAL HIGH (ref 8–23)
Calcium, Ion: 0.9 mmol/L — ABNORMAL LOW (ref 1.15–1.40)
Chloride: 95 mmol/L — ABNORMAL LOW (ref 98–111)
Creatinine, Ser: 8.5 mg/dL — ABNORMAL HIGH (ref 0.61–1.24)
Glucose, Bld: 114 mg/dL — ABNORMAL HIGH (ref 70–99)
HCT: 42 % (ref 39.0–52.0)
Hemoglobin: 14.3 g/dL (ref 13.0–17.0)
Potassium: 4.4 mmol/L (ref 3.5–5.1)
Sodium: 131 mmol/L — ABNORMAL LOW (ref 135–145)
TCO2: 26 mmol/L (ref 22–32)

## 2021-12-21 LAB — SURGICAL PCR SCREEN
MRSA, PCR: NEGATIVE
Staphylococcus aureus: NEGATIVE

## 2021-12-21 LAB — GLUCOSE, CAPILLARY
Glucose-Capillary: 137 mg/dL — ABNORMAL HIGH (ref 70–99)
Glucose-Capillary: 119 mg/dL — ABNORMAL HIGH (ref 70–99)
Glucose-Capillary: 113 mg/dL — ABNORMAL HIGH (ref 70–99)
Glucose-Capillary: 123 mg/dL — ABNORMAL HIGH (ref 70–99)
Glucose-Capillary: 186 mg/dL — ABNORMAL HIGH (ref 70–99)

## 2021-12-21 LAB — BASIC METABOLIC PANEL
Anion gap: 13 (ref 5–15)
BUN: 31 mg/dL — ABNORMAL HIGH (ref 8–23)
CO2: 27 mmol/L (ref 22–32)
Calcium: 7.9 mg/dL — ABNORMAL LOW (ref 8.9–10.3)
Chloride: 93 mmol/L — ABNORMAL LOW (ref 98–111)
Creatinine, Ser: 7.37 mg/dL — ABNORMAL HIGH (ref 0.61–1.24)
GFR, Estimated: 7 mL/min — ABNORMAL LOW (ref >60)
Glucose, Bld: 127 mg/dL — ABNORMAL HIGH (ref 70–99)
Potassium: 4.3 mmol/L (ref 3.5–5.1)
Sodium: 133 mmol/L — ABNORMAL LOW (ref 135–145)

## 2021-12-21 LAB — CBC
HCT: 38.2 % — ABNORMAL LOW (ref 39.0–52.0)
Hemoglobin: 12.9 g/dL — ABNORMAL LOW (ref 13.0–17.0)
MCH: 32.3 pg (ref 26.0–34.0)
MCHC: 33.8 g/dL (ref 30.0–36.0)
MCV: 95.5 fL (ref 80.0–100.0)
Platelets: 244 10*3/uL (ref 150–400)
RBC: 4 MIL/uL — ABNORMAL LOW (ref 4.22–5.81)
RDW: 13.1 % (ref 11.5–15.5)
WBC: 5.9 10*3/uL (ref 4.0–10.5)
nRBC: 0 % (ref 0.0–0.2)

## 2021-12-21 SURGERY — INSERTION OF ARTERIOVENOUS (AV) GORE-TEX GRAFT ARM
Anesthesia: Monitor Anesthesia Care | Site: Arm Upper | Laterality: Left

## 2021-12-21 SURGERY — INSERTION OF ARTERIOVENOUS (AV) GORE-TEX GRAFT ARM
Anesthesia: Choice | Laterality: Left

## 2021-12-21 MED ORDER — ACETAMINOPHEN 500 MG PO TABS: 1000.0000 mg | ORAL_TABLET | Freq: Once | ORAL | Status: DC

## 2021-12-21 MED ORDER — CHLORHEXIDINE GLUCONATE 0.12 % MT SOLN: 15.0000 mL | Freq: Once | OROMUCOSAL | Status: AC

## 2021-12-21 MED ORDER — CEFAZOLIN SODIUM-DEXTROSE 2-4 GM/100ML-% IV SOLN
2.0000 g | INTRAVENOUS | Status: AC
  Administered 2021-12-21: 2 g via INTRAVENOUS

## 2021-12-21 MED ORDER — LIDOCAINE HCL (PF) 1 % IJ SOLN
INTRAMUSCULAR | Status: DC | PRN
  Administered 2021-12-21: 20 mL

## 2021-12-21 MED ORDER — PHENYLEPHRINE HCL-NACL 20-0.9 MG/250ML-% IV SOLN
INTRAVENOUS | Status: DC | PRN
  Administered 2021-12-21: 40 ug/min via INTRAVENOUS

## 2021-12-21 MED ORDER — CEFAZOLIN SODIUM-DEXTROSE 2-4 GM/100ML-% IV SOLN
INTRAVENOUS | Status: AC
  Filled 2021-12-21: qty 100

## 2021-12-21 MED ORDER — HEPARIN 6000 UNIT IRRIGATION SOLUTION
Status: DC | PRN
  Administered 2021-12-21: 1

## 2021-12-21 MED ORDER — ACETAMINOPHEN 500 MG PO TABS
ORAL_TABLET | ORAL | Status: AC
  Administered 2021-12-21: 1000 mg via ORAL
  Filled 2021-12-21: qty 2

## 2021-12-21 MED ORDER — ONDANSETRON HCL 4 MG/2ML IJ SOLN
INTRAMUSCULAR | Status: AC
  Filled 2021-12-21: qty 2

## 2021-12-21 MED ORDER — PROPOFOL 500 MG/50ML IV EMUL
INTRAVENOUS | Status: DC | PRN
  Administered 2021-12-21: 50 ug/kg/min via INTRAVENOUS

## 2021-12-21 MED ORDER — PHENYLEPHRINE 80 MCG/ML (10ML) SYRINGE FOR IV PUSH (FOR BLOOD PRESSURE SUPPORT)
PREFILLED_SYRINGE | INTRAVENOUS | Status: AC
  Filled 2021-12-21: qty 10

## 2021-12-21 MED ORDER — LIDOCAINE HCL (PF) 1 % IJ SOLN
INTRAMUSCULAR | Status: AC
  Filled 2021-12-21: qty 30

## 2021-12-21 MED ORDER — HEPARIN 6000 UNIT IRRIGATION SOLUTION
Status: AC
  Filled 2021-12-21: qty 500

## 2021-12-21 MED ORDER — ONDANSETRON HCL 4 MG/2ML IJ SOLN
4.0000 mg | Freq: Once | INTRAMUSCULAR | Status: AC
  Administered 2021-12-21: 4 mg via INTRAVENOUS

## 2021-12-21 MED ORDER — CHLORHEXIDINE GLUCONATE 4 % EX LIQD: 60.0000 mL | Freq: Once | CUTANEOUS | Status: DC

## 2021-12-21 MED ORDER — FENTANYL CITRATE (PF) 250 MCG/5ML IJ SOLN
INTRAMUSCULAR | Status: AC
  Filled 2021-12-21: qty 5

## 2021-12-21 MED ORDER — PROTAMINE SULFATE 10 MG/ML IV SOLN
INTRAVENOUS | Status: AC
Start: 2021-12-21 — End: ?
  Filled 2021-12-21: qty 5

## 2021-12-21 MED ORDER — FENTANYL CITRATE (PF) 250 MCG/5ML IJ SOLN
INTRAMUSCULAR | Status: DC | PRN
  Administered 2021-12-21 (×2): 25 ug via INTRAVENOUS

## 2021-12-21 MED ORDER — ORAL CARE MOUTH RINSE: 15.0000 mL | Freq: Once | OROMUCOSAL | Status: AC

## 2021-12-21 MED ORDER — PROPOFOL 10 MG/ML IV BOLUS
INTRAVENOUS | Status: DC | PRN
  Administered 2021-12-21: 10 mg via INTRAVENOUS

## 2021-12-21 MED ORDER — HEPARIN SODIUM (PORCINE) 1000 UNIT/ML IJ SOLN
INTRAMUSCULAR | Status: DC | PRN
  Administered 2021-12-21: 5000 [IU] via INTRAVENOUS

## 2021-12-21 MED ORDER — 0.9 % SODIUM CHLORIDE (POUR BTL) OPTIME
TOPICAL | Status: DC | PRN
  Administered 2021-12-21: 1000 mL

## 2021-12-21 MED ORDER — CHLORHEXIDINE GLUCONATE CLOTH 2 % EX PADS: 6.0000 | MEDICATED_PAD | Freq: Every day | CUTANEOUS | Status: DC

## 2021-12-21 MED ORDER — LIDOCAINE-EPINEPHRINE (PF) 1 %-1:200000 IJ SOLN
INTRAMUSCULAR | Status: AC
  Filled 2021-12-21: qty 30

## 2021-12-21 MED ORDER — PROTAMINE SULFATE 10 MG/ML IV SOLN
INTRAVENOUS | Status: DC | PRN
  Administered 2021-12-21 (×3): 10 mg via INTRAVENOUS

## 2021-12-21 MED ORDER — INSULIN ASPART 100 UNIT/ML IJ SOLN: 0.0000 [IU] | INTRAMUSCULAR | Status: DC | PRN

## 2021-12-21 MED ORDER — ACETAMINOPHEN 500 MG PO TABS: 1000.0000 mg | ORAL_TABLET | Freq: Once | ORAL | Status: AC

## 2021-12-21 MED ORDER — LIDOCAINE-EPINEPHRINE (PF) 1 %-1:200000 IJ SOLN
INTRAMUSCULAR | Status: DC | PRN
  Administered 2021-12-21: 30 mL

## 2021-12-21 MED ORDER — PHENYLEPHRINE 80 MCG/ML (10ML) SYRINGE FOR IV PUSH (FOR BLOOD PRESSURE SUPPORT)
PREFILLED_SYRINGE | INTRAVENOUS | Status: DC | PRN
  Administered 2021-12-21 (×2): 80 ug via INTRAVENOUS
  Administered 2021-12-21: 120 ug via INTRAVENOUS
  Administered 2021-12-21 (×2): 80 ug via INTRAVENOUS

## 2021-12-21 MED ORDER — CHLORHEXIDINE GLUCONATE 0.12 % MT SOLN
OROMUCOSAL | Status: AC
  Administered 2021-12-21: 15 mL via OROMUCOSAL
  Filled 2021-12-21: qty 15

## 2021-12-21 MED ORDER — SODIUM CHLORIDE 0.9 % IV SOLN: INTRAVENOUS | Status: DC

## 2021-12-21 SURGICAL SUPPLY — 42 items
ADH SKN CLS APL DERMABOND .7 (GAUZE/BANDAGES/DRESSINGS) ×1
ARMBAND PINK RESTRICT EXTREMIT (MISCELLANEOUS) ×3 IMPLANT
BAG COUNTER SPONGE SURGICOUNT (BAG) ×2 IMPLANT
BAG SPNG CNTER NS LX DISP (BAG) ×1
CANISTER SUCT 3000ML PPV (MISCELLANEOUS) ×2 IMPLANT
CANNULA VESSEL 3MM 2 BLNT TIP (CANNULA) ×2 IMPLANT
CLIP TI MEDIUM 6 (CLIP) ×1 IMPLANT
CLIP TI WIDE RED SMALL 6 (CLIP) ×1 IMPLANT
COVER PROBE W GEL 5X96 (DRAPES) ×1 IMPLANT
DERMABOND ADVANCED (GAUZE/BANDAGES/DRESSINGS) ×1
DERMABOND ADVANCED .7 DNX12 (GAUZE/BANDAGES/DRESSINGS) ×1 IMPLANT
ELECT REM PT RETURN 9FT ADLT (ELECTROSURGICAL) ×2
ELECTRODE REM PT RTRN 9FT ADLT (ELECTROSURGICAL) ×1 IMPLANT
GLOVE BIO SURGEON STRL SZ7 (GLOVE) ×1 IMPLANT
GLOVE BIO SURGEON STRL SZ7.5 (GLOVE) ×2 IMPLANT
GLOVE BIOGEL PI IND STRL 8 (GLOVE) ×1 IMPLANT
GLOVE BIOGEL PI INDICATOR 8 (GLOVE) ×1
GLOVE SURG POLY ORTHO LF SZ7.5 (GLOVE) IMPLANT
GLOVE SURG UNDER LTX SZ8 (GLOVE) ×2 IMPLANT
GOWN STRL REUS W/ TWL LRG LVL3 (GOWN DISPOSABLE) ×3 IMPLANT
GOWN STRL REUS W/TWL LRG LVL3 (GOWN DISPOSABLE) ×6
GRAFT GORETEX STRT 4-7X45 (Vascular Products) ×1 IMPLANT
KIT BASIN OR (CUSTOM PROCEDURE TRAY) ×2 IMPLANT
KIT TURNOVER KIT B (KITS) ×2 IMPLANT
NDL HYPO 25GX1X1/2 BEV (NEEDLE) IMPLANT
NEEDLE HYPO 25GX1X1/2 BEV (NEEDLE) ×2 IMPLANT
NS IRRIG 1000ML POUR BTL (IV SOLUTION) ×2 IMPLANT
PACK CV ACCESS (CUSTOM PROCEDURE TRAY) ×2 IMPLANT
PAD ARMBOARD 7.5X6 YLW CONV (MISCELLANEOUS) ×4 IMPLANT
SLING ARM FOAM STRAP LRG (SOFTGOODS) IMPLANT
SLING ARM FOAM STRAP MED (SOFTGOODS) IMPLANT
SPIKE FLUID TRANSFER (MISCELLANEOUS) ×2 IMPLANT
SPONGE SURGIFOAM ABS GEL 100 (HEMOSTASIS) IMPLANT
SUT MNCRL AB 4-0 PS2 18 (SUTURE) ×4 IMPLANT
SUT PROLENE 6 0 BV (SUTURE) ×6 IMPLANT
SUT VIC AB 3-0 SH 27 (SUTURE) ×4
SUT VIC AB 3-0 SH 27X BRD (SUTURE) ×2 IMPLANT
SYR CONTROL 10ML LL (SYRINGE) ×1 IMPLANT
SYR TOOMEY 50ML (SYRINGE) IMPLANT
TOWEL GREEN STERILE (TOWEL DISPOSABLE) ×2 IMPLANT
UNDERPAD 30X36 HEAVY ABSORB (UNDERPADS AND DIAPERS) ×2 IMPLANT
WATER STERILE IRR 1000ML POUR (IV SOLUTION) ×2 IMPLANT

## 2021-12-21 NOTE — Progress Notes (Signed)
Physical Therapy Treatment Patient Details Name: Shawn Collins MRN: [id number] DOB: [birth year] Today's Date: 12/21/2021   History of Present Illness Pt is a 71 y.o. male who presented 12/18/21 with sudden N/V and dizziness. Pt administered TNK. 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  Had L UE AV fistula creation for HD on 12/21/21.  PMH: COPD, DM2, HTN, Hx of NSTEMI, CKD/ESRD, CAD    PT Comments    Patient denied dizziness, but continues with nausea after mobilizing.  He needed cues to protect L UE after having procedure this am for AV graft.  Patient not noted to lean to L today as last session, but remains impaired with his balance and activity tolerance.  Feel he remains appropriate for acute inpatient rehab.  PT will continue to follow.    Recommendations for follow up therapy are one component of a multi-disciplinary discharge planning process, led by the attending physician.  Recommendations may be updated based on patient status, additional functional criteria and insurance authorization.  Follow Up Recommendations  Acute inpatient rehab (3hours/day)     Assistance Recommended at Discharge Frequent or constant Supervision/Assistance  Patient can return home with the following A little help with walking and/or transfers;A little help with bathing/dressing/bathroom;Assistance with cooking/housework;Direct supervision/assist for medications management;Direct supervision/assist for financial management;Assist for transportation;Help with stairs or ramp for entrance   Equipment Recommendations  Rolling walker (2 wheels);BSC/3in1    Recommendations for Other Services       Precautions / Restrictions  Precautions Precautions: Fall Precaution Comments: New AV fistula L UE     Mobility  Bed Mobility Overal bed mobility: Needs Assistance       Supine to sit: Supervision     General bed mobility comments: cues for protecting L UE, increased time    Transfers Overall transfer level: Needs assistance Equipment used: Rolling walker (2 wheels) Transfers: Sit to/from Stand Sit to Stand: Min guard           General transfer comment: assist for balance, denies dizziness today    Ambulation/Gait Ambulation/Gait assistance: Min guard Gait Distance (Feet): 60 Feet Assistive device: Rolling walker (2 wheels) Gait Pattern/deviations: Step-through pattern, Decreased stride length, Wide base of support       General Gait Details: did not note leaning this session, but pt with nausea after ambulation but continued to deny dizziness   Stairs             Wheelchair Mobility    Modified Rankin (Stroke Patients Only) Modified Rankin (Stroke Patients Only) Pre-Morbid Rankin Score: No symptoms Modified Rankin: Moderately severe disability     Balance Overall balance assessment: Needs assistance Sitting-balance support: Feet supported Sitting balance-Leahy Scale: Good     Standing balance support: Reliant on assistive device for balance Standing balance-Leahy Scale: Poor                              Cognition Arousal/Alertness: Awake/alert Behavior During Therapy: WFL for tasks assessed/performed Overall Cognitive Status: Impaired/Different from baseline Area of Impairment: Safety/judgement, Problem solving                         Safety/Judgement: Decreased awareness of safety,  Decreased awareness of deficits   Problem Solving: Slow processing General Comments: needs frequent cues for L UE protection following graft for HD made in OR this am        Exercises      General Comments General comments (skin integrity, edema, etc.): VSS,  nausea end of session, but resolved and pt resumed eating his snack post procedure this am      Pertinent Vitals/Pain Pain Assessment Pain Assessment: Faces Faces Pain Scale: Hurts a little bit Pain Location: L UE Pain Descriptors / Indicators: Discomfort Pain Intervention(s): Monitored during session, Repositioned    Home Living                          Prior Function            PT Goals (current goals can now be found in the care plan section) Progress towards PT goals: Progressing toward goals    Frequency    Min 4X/week      PT Plan Current plan remains appropriate    Co-evaluation              AM-PAC PT "6 Clicks" Mobility   Outcome Measure  Help needed turning from your back to your side while in a flat bed without using bedrails?: A Little Help needed moving from lying on your back to sitting on the side of a flat bed without using bedrails?: A Little Help needed moving to and from a bed to a chair (including a wheelchair)?: A Little Help needed standing up from a chair using your arms (e.g., wheelchair or bedside chair)?: A Little Help needed to walk in hospital room?: A Little Help needed climbing 3-5 steps with a railing? : Total 6 Click Score: 16    End of Session Equipment Utilized During Treatment: Gait belt Activity Tolerance: Other (comment) (limited by nausea) Patient left: in chair;with call bell/phone within reach;with chair alarm set   PT Visit Diagnosis: Other abnormalities of gait and mobility (R26.89);Dizziness and giddiness (R42);Difficulty in walking, not elsewhere classified (R26.2);Muscle weakness (generalized) (M62.81);Other symptoms and signs involving the nervous system (R29.898)     Time: 5053-9767 PT Time Calculation (min) (ACUTE ONLY): 15 min  Charges:  $Gait Training: 8-22 mins                     Magda Kiel, PT Acute Rehabilitation Services Office:[phone] 12/21/2021    Reginia Naas 12/21/2021, 5:56  PM

## 2021-12-21 NOTE — Progress Notes (Signed)
Pt receives out-pt HD at [facility] on MWF. Pt arrives at 11:35 for 11:55 chair time. Will assist as needed.   Salote Weidmann Renal Navigator [phone] 

## 2021-12-21 NOTE — Progress Notes (Signed)
PT Cancellation Note  Patient Details Name: Shawn Collins MRN: [id number] DOB: [birth year]   Cancelled Treatment:    Reason Eval/Treat Not Completed: Patient at procedure or test/unavailable; patient off the floor for permanent dialysis access.  PT will follow up as schedule permits and pt available.    Reginia Naas 12/21/2021, 9:26 AM Magda Kiel, PT Acute Rehabilitation Services Office:[phone] 12/21/2021

## 2021-12-21 NOTE — Progress Notes (Signed)
Patient's CBG in SS was 119.  No insulin given per Periop Glycemic Protocol.

## 2021-12-21 NOTE — Progress Notes (Addendum)
STROKE TEAM PROGRESS NOTE   INTERVAL HISTORY PT and OT evaluation recommending CIR. No family at the bedside.   Still having some nausea without complaints of further dizziness. Resume home BP medications. BP systolic 458K AV fistula placed today with some fatigue complaints post-anesthesia.   Vitals:   12/21/21 0922 12/21/21 1140 12/21/21 1145 12/21/21 1200  BP:  117/68 107/76 (!) 110/56  Pulse:  69 70 70  Resp:  19 18 14   Temp:  98.3 F (36.8 C)  98.5 F (36.9 C)  TempSrc:      SpO2:  99% 99% 99%  Weight: 53.4 kg     Height: 5\' 8"  (1.727 m)      CBC:  Recent Labs  Lab 12/18/21 1811 12/18/21 1822 12/20/21 0238 12/21/21 0418 12/21/21 0922  WBC 6.8  --  8.4 5.9  --   NEUTROABS 4.9  --   --   --   --   HGB 14.6   < > 13.5 12.9* 14.3  HCT 43.9   < > 40.1 38.2* 42.0  MCV 96.3  --  96.4 95.5  --   PLT 236  --  279 244  --    < > = values in this interval not displayed.    Basic Metabolic Panel:  Recent Labs  Lab 12/19/21 0559 12/20/21 0238 12/21/21 0418 12/21/21 0922  NA  --  137 133* 131*  K  --  5.0 4.3 4.4  CL  --  101 93* 95*  CO2  --  20* 27  --   GLUCOSE  --  112* 127* 114*  BUN  --  51* 31* 35*  CREATININE  --  10.24* 7.37* 8.50*  CALCIUM  --  8.2* 7.9*  --   MG 2.2  --   --   --   PHOS  --  7.5*  --   --     Lipid Panel:  Recent Labs  Lab 12/19/21 0559  CHOL 160  TRIG 62  HDL 44  CHOLHDL 3.6  VLDL 12  LDLCALC 104*    HgbA1c:  Recent Labs  Lab 12/18/21 1950  HGBA1C 5.2    Alcohol Level  Recent Labs  Lab 12/18/21 1950  ETH <10    ________________________________________________________________  IMAGING past 48hrs  MR BRAIN WO CONTRAST  Result Date: 12/19/2021 CLINICAL DATA:  Provided history: Neuro deficit, acute, stroke suspected. EXAM: MRI HEAD WITHOUT CONTRAST TECHNIQUE: Multiplanar, multiecho pulse sequences of the brain and surrounding structures were obtained without intravenous contrast. COMPARISON:  CT angiogram  head/neck 12/18/2021. Non-contrast head CT 12/18/2021. FINDINGS: Intermittently motion degraded examination (with up to moderate motion degradation of the acquired sequences). Brain: Moderate generalized cerebral atrophy. Comparatively mild cerebellar atrophy. 6 mm acute infarct at the junction of the posterior pons and middle cerebellar peduncle on the right (series 2, image 15) (series 3, image 17). 3 mm focus of subtle diffusion weighted signal abnormality within the posterior limb of left internal capsule, likely reflecting an additional small acute infarct (for instance as seen on series 2, image 29) (series 3, image 20). Chronic small-vessel infarcts within bilateral cerebral hemispheric white matter and within the left basal ganglia/internal capsule. Background moderate multifocal T2 FLAIR hyperintense signal abnormality within the cerebral white matter, nonspecific but compatible chronic small vessel ischemic disease. Tiny T2 hyperintense focus within the left middle cerebellar peduncle, which may reflect a prominent perivascular space or chronic lacunar infarct (series 6, image 8). Suspected tiny chronic infarct within the inferior  right cerebellar hemisphere (series 6, image 5). Chronic microhemorrhage within the medial left cerebellar hemisphere. No evidence of an intracranial mass. No extra-axial fluid collection. No midline shift. Vascular: Signal abnormality within the intracranial left vertebral artery compatible with vessel occlusion (as was demonstrated on the CTA head/neck 12/18/2021). Flow voids preserved elsewhere within the proximal large arterial vessels. Skull and upper cervical spine: No focal suspicious marrow lesion. Incompletely assessed cervical spondylosis. Sinuses/Orbits: Minimal mucosal thickening within the bilateral frontal and ethmoid sinuses. IMPRESSION: 1. Motion degraded examination. 2. 6 mm acute infarct at the junction of the posterior pons and middle cerebellar peduncle on the  right. 3. Probable additional 3 mm acute infarct within the posterior limb of left internal capsule. 4. Background cerebral atrophy, chronic small vessel ischemic disease and chronic infarcts as detailed. 5. Signal abnormality within the intracranial left vertebral artery compatible with vessel occlusion (as was demonstrated on the CTA head/neck of 12/18/2021). Electronically Signed   By: Kellie Simmering D.O.   On: 12/19/2021 18:21   ________________________________________________________________  PHYSICAL EXAM  Neurological Exam: Mental status/Cognition: Alert, oriented x4  Speech/language: Fluent, comprehension intact, object naming intact, repetition intact. Speech clear, no dysarthria  Cranial nerves:   CN II Pupils equal and reactive to light, no VF deficits    CN III,IV,VI EOM intact, no gaze preference or deviation, no further nystagmus noted on exam, denies diplopia.  No ptosis   CN V normal sensation in V1, V2, and V3 segments bilaterally    CN VII no asymmetry, no nasolabial fold flattening    CN VIII normal hearing to speech    CN IX & X normal palatal elevation, no uvular deviation    CN XI 5/5 head turn and 5/5 shoulder shrug bilaterally    CN XII midline tongue protrusion      Motor:  Muscle bulk and tone: normal Pronator drift: none Tremor: none  Strength: RUE:  Grip 5/5    biceps    5/5   triceps  5/5      LUE:  Grip 5/5    biceps  5/5   triceps  5/5 RLE:  Thigh 5/5    knee   5/5    plantar flexion  5/5   dorsiflexion  5/5        LLE:  Thigh 5/5    knee   5/5    plantar flexion  5/5   dorsiflexion  5/5   Reflexes: deferred   Sensation: FACE: normal,  ARMS: normal bil,  LEGS: normal bil   Coordination/Complex Motor:  - Finger to Nose intact BL - Heel to shin intact BL - Rapid alternating movement are normal - Gait: deferred  ________________________________________________________________  ASSESSMENT/PLAN [name] is a 71 y.o. male with history of  COPD, DM2, HTN, Hx of NSTEMI, CKD/ESRD, CAD presenting with sudden N/V and dizziness. S/p TNK. Stable for transfer out of ICU. PT/OT currently recommending CIR.  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  Moderate stenosis within the supraclinoid right ICA. Moderate stenosis within an inferior division proximal M2 right MCA vessel. 24hr MRI Brain 7/23:  6 mm acute infarct at the junction of the posterior pons and middle cerebellar peduncle on the  right. 3 mm later subacute infarct within the PLIC 2D Echo EF > 75% LDL 104 A1C 5.2 UDS Negative VTE prophylaxis - SCD's aspirin 81 mg daily prior to admission, now on ASA and plavix DAPT for 3 weeks and then plavix alone.  PT/OT: CIR Deposition - pending  Hypertension Home meds:  amlodipine 10mg /d, carvedilol 6.25mg  bid, furosamide 40mg /d, losartan 25mg /d stable on the high and now off clevidipne IV Currently on amlodipine 10, hydralazine 25->50 tid, losartan 25 daily Long-term BP goal normotensive  Hyperlipidemia Home meds:  atorvastatin 20mg /d  LDL 104, goal < 70 Now on atorvastatin 40mg  Continue statin at discharge  Diabetes type II  Well Controlled per A1C result Home meds:  glargine insulin 10u p.m. HgbA1c 5.2, goal < 7.0 CBGs:   Sliding Scale Insulin Close PCP follow-up for DM control  Other Stroke Risk Factors Advanced Age >/= 29  COPD/Emphysema: former Cigarette smoker Family hx stroke (father) Coronary artery disease s/p CABG  Other active problems ESRD on HD HD done 7/24 inpatient S/p new left arm AVF with VVS 7/25 QT prolongation, repeat EKG, now QTc within range.  Hospital day # 3  Patient seen and  examined by NP/APP with MD. MD to update note as needed.   Anibal Henderson, AGANCP-BC [facility] Pager: [phone]  ATTENDING NOTE: I reviewed above note and agree with the assessment and plan. Pt was seen and examined.   No family at bedside.  Patient sitting in chair, had procedure with vascular surgery this morning for new left AV graft.  Tolerating well.  Still has left gaze nystagmus and minimal right gaze nystagmus.  Intermittent nausea feeling with movement.  PT/OT recommend CIR.  Pending placement.  Continue DAPT and statin.  For detailed assessment and plan, please refer to above/below as I have made changes wherever appropriate.   Rosalin Hawking, MD PhD Stroke Neurology 12/21/2021 5:58 PM     To contact Stroke Continuity provider, please refer to [email or web address] After hours, contact General Neurology

## 2021-12-21 NOTE — H&P (Signed)
Vascular Surgery. For Left AVF/AVG today.  Discussed with pt. All questions answered. Gae Gallop, MD 9:48 AM

## 2021-12-21 NOTE — Progress Notes (Signed)
Inpatient Rehab Coordinator Note:  I met with patient at bedside to discuss CIR recommendations and goals/expectations of CIR stay.  We reviewed 3 hrs/day of therapy, physician follow up, and average length of stay 2 weeks (dependent upon progress) with goals of supervision.  Pt is agreeable, states he lives in a boarding house (not a group home), but has limited support.  He was agreeable to me calling his niece to discuss dispo, but her phone number was listed incorrectly in the chart.  Left voicemail for pt's sister [phone]) to discuss rehab needs and available caregiver support.   Shann Medal, PT, DPT Admissions Coordinator [phone] 12/21/21  3:33 PM

## 2021-12-21 NOTE — Anesthesia Preprocedure Evaluation (Signed)
Anesthesia Evaluation  Patient identified by MRN, date of birth, ID band Patient awake    Reviewed: Allergy & Precautions, NPO status , Patient's Chart, lab work & pertinent test results  Airway Mallampati: II  TM Distance: >3 FB Neck ROM: Full    Dental  (+) Dental Advisory Given   Pulmonary COPD, former smoker,    breath sounds clear to auscultation       Cardiovascular hypertension, Pt. on medications and Pt. on home beta blockers + CAD, + Past MI and + DOE   Rhythm:Regular Rate:Normal     Neuro/Psych CVA    GI/Hepatic negative GI ROS, Neg liver ROS,   Endo/Other  diabetes, Type 2, Insulin Dependent  Renal/GU ESRF and DialysisRenal disease     Musculoskeletal   Abdominal   Peds  Hematology negative hematology ROS (+)   Anesthesia Other Findings   Reproductive/Obstetrics                             Anesthesia Physical Anesthesia Plan  ASA: 3  Anesthesia Plan: MAC   Post-op Pain Management: Minimal or no pain anticipated and Tylenol PO (pre-op)*   Induction:   PONV Risk Score and Plan: 1 and Propofol infusion  Airway Management Planned: Natural Airway and Simple Face Mask  Additional Equipment:   Intra-op Plan:   Post-operative Plan:   Informed Consent: I have reviewed the patients History and Physical, chart, labs and discussed the procedure including the risks, benefits and alternatives for the proposed anesthesia with the patient or authorized representative who has indicated his/her understanding and acceptance.       Plan Discussed with:   Anesthesia Plan Comments:         Anesthesia Quick Evaluation

## 2021-12-21 NOTE — Anesthesia Procedure Notes (Addendum)
Procedure Name: MAC Date/Time: 12/21/2021 10:02 AM  Performed by: Merryl Hacker, RNPre-anesthesia Checklist: Patient identified, Emergency Drugs available, Suction available and Patient being monitored Patient Re-evaluated:Patient Re-evaluated prior to induction Preoxygenation: Pre-oxygenation with 100% oxygen Induction Type: IV induction Placement Confirmation: positive ETCO2 and breath sounds checked- equal and bilateral Dental Injury: Teeth and Oropharynx as per pre-operative assessment

## 2021-12-21 NOTE — Op Note (Signed)
    NAME: Shawn Collins    MRN: [id number] DOB: 07/09/1950    DATE OF OPERATION: 12/21/2021  PREOP DIAGNOSIS:    End-stage renal disease  POSTOP DIAGNOSIS:    Same  PROCEDURE:    New left forearm AV graft (4-7 mm PTFE graft)  SURGEON: Judeth Cornfield. Scot Dock, MD  ASSIST: Luisa Dago, PA  ANESTHESIA: Local with sedation  EBL: Minimal  INDICATIONS:    Shawn Collins is a 71 y.o. male who presents for new access  FINDINGS:   There was excellent thrill in the graft at the completion of the procedure with a brisk radial signal with the Doppler.  The vein took a 5 mm dilator.  The brachial artery was about 4 mm in diameter.  TECHNIQUE:   The patient was taken to the operating room and sedated by anesthesia.  The left arm was prepped and draped in usual sterile fashion.  I did look at the veins myself with the SonoSite and I did not think he was a candidate for a fistula.  The left arm was prepped and draped in usual sterile fashion.  After the skin was anesthetized a longitudinal incision was made above the antecubital level.  Here the brachial artery was dissected free and it had a good pulse.  It was about 4 mm in diameter.  The deeper of the 2 brachial veins appear to be larger in size.  This was controlled with a vessel loop.  A 4-7 mm PTFE graft was then tunneled in a looped fashion in the forearm using 1 distal counterincision.  The arterial aspect of the graft was tunneled along the radial aspect of the forearm.  Patient was heparinized.  The brachial artery was then clamped proximally and distally and a longitudinal arteriotomy was made.  A short segment of the 4 mm end of the graft excised, the graft slightly spatulated and sewn end-to-side to the artery using continuous 6-0 Prolene suture.  The graft then pulled the appropriate length for anastomosis to the brachial vein.  The vein was ligated distally and spatulated proximally.  I then irrigated with heparinized saline.  It  did take a 5 mm dilator.  The vein was sewn into into the graft using 2 continuous 6-0 Prolene sutures.  At the completion there was a good thrill in the graft.  There was a radial signal with the Doppler.  Hemostasis was obtained in the wounds.  The heparin was partially reversed with protamine.  The wounds were closed with a deep layer of 3-0 Vicryl and the skin closed with 4-0 Monocryl.  Dermabond was applied.  The patient tolerated the procedure well was transferred to recovery room in stable condition.  All needle and sponge counts were correct  Given the complexity of the case,  the assistant was necessary in order to expedient the procedure and safely perform the technical aspects of the operation.  The assistant provided traction and countertraction to assist with exposure of the artery and vein.  They also assisted with suture ligation of multiple venous branches. They also assisted with tunneling of the graft.  They played a critical role for both anastomoses.. These skills, especially following the Prolene suture for the anastomosis, could not have been adequately performed by a scrub tech assistant.    Deitra Mayo, MD, FACS [facility]  DATE OF DICTATION:   12/21/2021

## 2021-12-21 NOTE — Anesthesia Postprocedure Evaluation (Signed)
Anesthesia Post Note  Patient: Shawn Collins  Procedure(s) Performed: INSERTION OF LEFT ARM ARTERIOVENOUS (AV) GORE-TEX GRAFT (Left: Arm Upper)     Patient location during evaluation: PACU Anesthesia Type: MAC Level of consciousness: awake and alert Pain management: pain level controlled Vital Signs Assessment: post-procedure vital signs reviewed and stable Respiratory status: spontaneous breathing, nonlabored ventilation, respiratory function stable and patient connected to nasal cannula oxygen Cardiovascular status: stable and blood pressure returned to baseline Postop Assessment: no apparent nausea or vomiting Anesthetic complications: no   No notable events documented.  Last Vitals:  Vitals:   12/21/21 1500 12/21/21 1552  BP: 117/68   Pulse: 71   Resp: 12   Temp:  (!) 36.4 C  SpO2: 100%     Last Pain:  Vitals:   12/21/21 1552  TempSrc: Oral  PainSc:                  [name]

## 2021-12-21 NOTE — Progress Notes (Signed)
[facility] Progress Note  Subjective: had new L FA AVG done this am by Dr Scot Dock  Vitals:   12/21/21 0700 12/21/21 0800 12/21/21 0854 12/21/21 0922  BP: 132/65 (!) 143/79 (!) 141/76   Pulse: 65 69 70   Resp: 10 12 17    Temp:   98.1 F (36.7 C)   TempSrc:      SpO2: 98% 100% 98%   Weight:    53.4 kg  Height:    5\' 8"  (1.727 m)    Exam: Gen: NAD, alert HEENT: anicteric sclera, MMM CV: normal rate, no murmurs, no edema Lungs: bilateral chest rise, normal wob Abd: soft, non-tender, non-distended Neuro: normal speech, no gross focal deficits  Dialysis access: RIJ TDC intact, new L arm AVG +bruit   Home meds: amlodipine 10, carvedilol 6.25 bid, furosemide 40 qd, hydralazine 25 tid, losartan 25 qd, aspirin, atrovastatin, insulin glargine, sl ntg, velphoro 500 ac tid   OP HD: [facility] MWF 4h  400/800   56.5kg  2/2.5 bath TDC  Hep 8000 - sensipar 30 tiw po - doxecalciferol 4 ug tiw IV - no esa/ Fe   Assessment/ Plan: Acute CVA - sp TNK, 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 Per neurology HTN - permissive HTN per neurology. Has chronic HTN w/ possible nonadherence at times. SP cleviprex drip, is off now. Getting most of home meds per pmd.  Vol - No vol excess, under dry wt, low UFG next HD.  ESRD - usual HD MWF. HD tomorrow.  HD access - going to OR today for permanent access. Appreciate assistance.  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 Not on ESA or iron, monitor for now.  MBD ckd - CCa in range, phos high. Continue sensipar and IV vdra w/ hd.  Cont velphoro as binder.  DM 2 -per primary  Rob Hilja Kintzel 12/21/2021, 9:38 AM   Recent Labs  Lab 12/18/21 1811 12/18/21 1822 12/20/21 0238 12/21/21 0418  HGB 14.6   < > 13.5 12.9*  ALBUMIN 4.1  --   --   --   CALCIUM 8.7*  --  8.2* 7.9*  PHOS  --   --  7.5*  --   CREATININE 7.57*   < > 10.24* 7.37*  K 4.0   < > 5.0 4.3   < > = values in this interval not displayed.    No results for input(s): "IRON", "TIBC", "FERRITIN" in the  last 168 hours. Inpatient medications:  [MAR Hold] amLODipine  10 mg Oral Daily   [MAR Hold] aspirin EC  81 mg Oral Daily   [MAR Hold] atorvastatin  40 mg Oral Daily   chlorhexidine  60 mL Topical Once   And   [START ON 12/22/2021] chlorhexidine  60 mL Topical Once   [MAR Hold] Chlorhexidine Gluconate Cloth  6 each Topical Q0600   [MAR Hold] Chlorhexidine Gluconate Cloth  6 each Topical Q0600   [MAR Hold] cinacalcet  30 mg Oral Q M,W,F   [MAR Hold] clopidogrel  75 mg Oral Daily   [MAR Hold] doxercalciferol  4 mcg Intravenous Q M,W,F-HD   [MAR Hold] hydrALAZINE  50 mg Oral TID   [MAR Hold] insulin aspart  0-6 Units Subcutaneous TID AC & HS   [MAR Hold] losartan  25 mg Oral Daily   [MAR Hold] pantoprazole  40 mg Oral Daily    sodium chloride 10 mL/hr at 12/21/21 0921   ceFAZolin      ceFAZolin (ANCEF) IV     [MAR Hold] acetaminophen **  OR** [MAR Hold] acetaminophen (TYLENOL) oral liquid 160 mg/5 mL **OR** [MAR Hold] acetaminophen, ceFAZolin, insulin aspart, [MAR Hold] ondansetron (ZOFRAN) IV, [MAR Hold] senna-docusate

## 2021-12-21 NOTE — Transfer of Care (Signed)
Immediate Anesthesia Transfer of Care Note  Patient: Shawn Collins  Procedure(s) Performed: INSERTION OF LEFT ARM ARTERIOVENOUS (AV) GORE-TEX GRAFT (Left: Arm Upper)  Patient Location: PACU  Anesthesia Type:MAC  Level of Consciousness: awake, alert  and oriented  Airway & Oxygen Therapy: Patient Spontanous Breathing  Post-op Assessment: Report given to RN, Post -op Vital signs reviewed and stable and Patient moving all extremities X 4  Post vital signs: Reviewed and stable  Last Vitals:  Vitals Value Taken Time  BP 117/68 12/21/21 1139  Temp    Pulse 69 12/21/21 1140  Resp 19 12/21/21 1140  SpO2 99 % 12/21/21 1140  Vitals shown include unvalidated device data.  Last Pain:  Vitals:   12/21/21 0901  TempSrc:   PainSc: 0-No pain      Patients Stated Pain Goal: 3 (55/20/80 2233)  Complications: No notable events documented.

## 2021-12-22 ENCOUNTER — Inpatient Hospital Stay (HOSPITAL_COMMUNITY): Payer: Medicare Other

## 2021-12-22 ENCOUNTER — Encounter (HOSPITAL_COMMUNITY): Payer: Self-pay | Admitting: Vascular Surgery

## 2021-12-22 DIAGNOSIS — R112 Nausea with vomiting, unspecified: Secondary | ICD-10-CM

## 2021-12-22 DIAGNOSIS — I6350 Cerebral infarction due to unspecified occlusion or stenosis of unspecified cerebral artery: Secondary | ICD-10-CM | POA: Diagnosis not present

## 2021-12-22 DIAGNOSIS — I160 Hypertensive urgency: Secondary | ICD-10-CM | POA: Diagnosis not present

## 2021-12-22 DIAGNOSIS — I639 Cerebral infarction, unspecified: Secondary | ICD-10-CM | POA: Diagnosis not present

## 2021-12-22 DIAGNOSIS — N186 End stage renal disease: Secondary | ICD-10-CM | POA: Diagnosis not present

## 2021-12-22 LAB — CBC
HCT: 38.9 % — ABNORMAL LOW (ref 39.0–52.0)
HCT: 40.4 % (ref 39.0–52.0)
Hemoglobin: 13.9 g/dL (ref 13.0–17.0)
Hemoglobin: 13.9 g/dL (ref 13.0–17.0)
MCH: 32.8 pg (ref 26.0–34.0)
MCH: 32.6 pg (ref 26.0–34.0)
MCHC: 35.7 g/dL (ref 30.0–36.0)
MCHC: 34.4 g/dL (ref 30.0–36.0)
MCV: 91.7 fL (ref 80.0–100.0)
MCV: 94.8 fL (ref 80.0–100.0)
Platelets: 242 10*3/uL (ref 150–400)
Platelets: 234 10*3/uL (ref 150–400)
RBC: 4.24 MIL/uL (ref 4.22–5.81)
RBC: 4.26 MIL/uL (ref 4.22–5.81)
RDW: 12.8 % (ref 11.5–15.5)
RDW: 13 % (ref 11.5–15.5)
WBC: 9.2 10*3/uL (ref 4.0–10.5)
WBC: 9.9 10*3/uL (ref 4.0–10.5)
nRBC: 0 % (ref 0.0–0.2)
nRBC: 0 % (ref 0.0–0.2)

## 2021-12-22 LAB — BASIC METABOLIC PANEL
Anion gap: 20 — ABNORMAL HIGH (ref 5–15)
BUN: 46 mg/dL — ABNORMAL HIGH (ref 8–23)
CO2: 16 mmol/L — ABNORMAL LOW (ref 22–32)
Calcium: 8.3 mg/dL — ABNORMAL LOW (ref 8.9–10.3)
Chloride: 95 mmol/L — ABNORMAL LOW (ref 98–111)
Creatinine, Ser: 9.81 mg/dL — ABNORMAL HIGH (ref 0.61–1.24)
GFR, Estimated: 5 mL/min — ABNORMAL LOW (ref >60)
Glucose, Bld: 182 mg/dL — ABNORMAL HIGH (ref 70–99)
Potassium: 4.4 mmol/L (ref 3.5–5.1)
Sodium: 131 mmol/L — ABNORMAL LOW (ref 135–145)

## 2021-12-22 LAB — GLUCOSE, CAPILLARY
Glucose-Capillary: 171 mg/dL — ABNORMAL HIGH (ref 70–99)
Glucose-Capillary: 188 mg/dL — ABNORMAL HIGH (ref 70–99)
Glucose-Capillary: 109 mg/dL — ABNORMAL HIGH (ref 70–99)
Glucose-Capillary: 88 mg/dL (ref 70–99)

## 2021-12-22 MED ORDER — LIDOCAINE HCL (PF) 1 % IJ SOLN: 5.0000 mL | INTRAMUSCULAR | Status: DC | PRN

## 2021-12-22 MED ORDER — CHLORPROMAZINE HCL 25 MG PO TABS: 25.0000 mg | ORAL_TABLET | Freq: Three times a day (TID) | ORAL | Status: DC | PRN

## 2021-12-22 MED ORDER — LOSARTAN POTASSIUM 50 MG PO TABS: 50.0000 mg | ORAL_TABLET | Freq: Every day | ORAL | Status: DC

## 2021-12-22 MED ORDER — SODIUM CHLORIDE 0.9 % IV SOLN: INTRAVENOUS | Status: DC

## 2021-12-22 MED ORDER — CARVEDILOL 12.5 MG PO TABS
12.5000 mg | ORAL_TABLET | Freq: Two times a day (BID) | ORAL | Status: DC
  Administered 2021-12-22 – 2021-12-28 (×9): 12.5 mg via ORAL
  Filled 2021-12-22 (×10): qty 1

## 2021-12-22 MED ORDER — OXYCODONE-ACETAMINOPHEN 5-325 MG PO TABS
1.0000 | ORAL_TABLET | ORAL | Status: DC | PRN
  Administered 2021-12-24 – 2021-12-28 (×3): 1 via ORAL
  Filled 2021-12-22 (×3): qty 1

## 2021-12-22 MED ORDER — CLEVIDIPINE BUTYRATE 0.5 MG/ML IV EMUL
0.0000 mg/h | INTRAVENOUS | Status: DC
  Administered 2021-12-22: 1 mg/h via INTRAVENOUS
  Administered 2021-12-22: 10 mg/h via INTRAVENOUS
  Filled 2021-12-22 (×2): qty 50

## 2021-12-22 MED ORDER — HEPARIN SODIUM (PORCINE) 1000 UNIT/ML DIALYSIS
1000.0000 [IU] | INTRAMUSCULAR | Status: DC | PRN
Start: 2021-12-22 — End: 2021-12-24

## 2021-12-22 MED ORDER — HYDRALAZINE HCL 50 MG PO TABS
100.0000 mg | ORAL_TABLET | Freq: Three times a day (TID) | ORAL | Status: DC
  Administered 2021-12-22 – 2021-12-23 (×2): 100 mg via ORAL
  Filled 2021-12-22 (×2): qty 2

## 2021-12-22 MED ORDER — IOHEXOL 300 MG/ML  SOLN
100.0000 mL | Freq: Once | INTRAMUSCULAR | Status: AC | PRN
  Administered 2021-12-22: 100 mL via INTRAVENOUS

## 2021-12-22 MED ORDER — ALTEPLASE 2 MG IJ SOLR: 2.0000 mg | Freq: Once | INTRAMUSCULAR | Status: DC | PRN

## 2021-12-22 MED ORDER — LIDOCAINE-PRILOCAINE 2.5-2.5 % EX CREA: 1.0000 | TOPICAL_CREAM | CUTANEOUS | Status: DC | PRN

## 2021-12-22 MED ORDER — SENNOSIDES-DOCUSATE SODIUM 8.6-50 MG PO TABS
1.0000 | ORAL_TABLET | Freq: Two times a day (BID) | ORAL | Status: DC
  Administered 2021-12-22 – 2021-12-28 (×10): 1 via ORAL
  Filled 2021-12-22 (×12): qty 1

## 2021-12-22 MED ORDER — HEPARIN SODIUM (PORCINE) 1000 UNIT/ML DIALYSIS
2400.0000 [IU] | INTRAMUSCULAR | Status: DC | PRN
  Administered 2021-12-24: 3600 [IU] via INTRAVENOUS_CENTRAL
  Filled 2021-12-22: qty 3

## 2021-12-22 MED ORDER — CHLORPROMAZINE HCL 25 MG/ML IJ SOLN
25.0000 mg | Freq: Once | INTRAMUSCULAR | Status: AC
  Administered 2021-12-22: 25 mg via INTRAVENOUS
  Filled 2021-12-22: qty 1

## 2021-12-22 MED ORDER — ANTICOAGULANT SODIUM CITRATE 4% (200MG/5ML) IV SOLN: 5.0000 mL | Status: DC | PRN

## 2021-12-22 MED ORDER — HEPARIN SODIUM (PORCINE) 5000 UNIT/ML IJ SOLN
5000.0000 [IU] | Freq: Three times a day (TID) | INTRAMUSCULAR | Status: DC
  Administered 2021-12-22 – 2021-12-28 (×17): 5000 [IU] via SUBCUTANEOUS
  Filled 2021-12-22 (×17): qty 1

## 2021-12-22 MED ORDER — SODIUM CHLORIDE 0.9 % IV SOLN
12.5000 mg | Freq: Once | INTRAVENOUS | Status: AC
  Administered 2021-12-22: 12.5 mg via INTRAVENOUS
  Filled 2021-12-22: qty 0.5

## 2021-12-22 MED ORDER — PENTAFLUOROPROP-TETRAFLUOROETH EX AERO: 1.0000 | INHALATION_SPRAY | CUTANEOUS | Status: DC | PRN

## 2021-12-22 MED ORDER — MORPHINE SULFATE (PF) 2 MG/ML IV SOLN: 2.0000 mg | INTRAVENOUS | Status: DC | PRN

## 2021-12-22 NOTE — Progress Notes (Signed)
Pt suddenly had n/v and saying that he "just feels bad". Pt's NIH increased to 2. Called MD. Phenergan orders given.

## 2021-12-22 NOTE — Progress Notes (Signed)
Order of events: KUB, CT abdomen, HD then MRI per Dr. Erlinda Hong. HD said he will be done next, soon after lunch.

## 2021-12-22 NOTE — Progress Notes (Signed)
PT LAYING IN BED RESTING HD BS DUE TO ICU STATUS HD CVC WDL NEW AVF +/+ NO S//S OF INFECTION UFG 2L

## 2021-12-22 NOTE — Progress Notes (Signed)
Received patient in bed, alert and oriented. Informed consent signed and in chart.  Time tx completed:1827  HD treatment completed. Patient tolerated well. Fistula/Graft/HD catheter without signs and symptoms of complications. Patient transported back to the room, alert and orient and in no acute distress. Report given to bedside RN.  Total UF removed:2L  Medication given:1mcg hecterol  Post HD VS:see flowsheet  Post HD weight: 57.4kg

## 2021-12-22 NOTE — Progress Notes (Addendum)
  Progress Note    12/22/2021 7:42 AM 1 Day Post-Op  Subjective:  Patient is feeling very nauseous and vomiting this AM.    Vitals:   12/22/21 0615 12/22/21 0630  BP: (!) 158/71 137/70  Pulse: 87 87  Resp: (!) 22 (!) 33  Temp:    SpO2: 100% 100%    Physical Exam: Lungs:  nonlabored Incisions:  left arm incisions c/d/l. Forearm loop graft with good thrill. No sign of hematoma Extremities:  Palpable left radial pulse   CBC    Component Value Date/Time   WBC 9.2 12/22/2021 0626   RBC 4.24 12/22/2021 0626   HGB 13.9 12/22/2021 0626   HGB 12.2 (L) 12/17/2019 1243   HCT 38.9 (L) 12/22/2021 0626   HCT 37.0 (L) 12/17/2019 1243   PLT 242 12/22/2021 0626   PLT 282 12/17/2019 1243   MCV 91.7 12/22/2021 0626   MCV 91 12/17/2019 1243   MCH 32.8 12/22/2021 0626   MCHC 35.7 12/22/2021 0626   RDW 12.8 12/22/2021 0626   RDW 12.9 12/17/2019 1243   LYMPHSABS 1.2 12/18/2021 1811   MONOABS 0.5 12/18/2021 1811   EOSABS 0.1 12/18/2021 1811   BASOSABS 0.1 12/18/2021 1811    BMET    Component Value Date/Time   NA 131 (L) 12/22/2021 0626   NA 138 12/17/2019 1243   K 4.4 12/22/2021 0626   CL 95 (L) 12/22/2021 0626   CO2 16 (L) 12/22/2021 0626   GLUCOSE 182 (H) 12/22/2021 0626   BUN 46 (H) 12/22/2021 0626   BUN 30 (H) 12/17/2019 1243   CREATININE 9.81 (H) 12/22/2021 0626   CALCIUM 8.3 (L) 12/22/2021 0626   GFRNONAA 5 (L) 12/22/2021 0626   GFRAA 31 (L) 12/17/2019 1243    INR    Component Value Date/Time   INR 1.1 12/18/2021 1810     Intake/Output Summary (Last 24 hours) at 12/22/2021 0742 Last data filed at 12/22/2021 0423 Gross per 24 hour  Intake 600 ml  Output 25 ml  Net 575 ml     Assessment/Plan:  71 y.o. male is 1 day post op, s/p: left forearm AV graft  -Patient not feeling well this morning with nausea -Incision sites are c/d/l -Graft has a good thrill -Palpable left radial pulse   Vicente Serene, PA-C [facility] [phone] 12/22/2021 7:42 AM  I have interviewed the patient and examined the patient. I agree with the findings by the PA.  The graft has a good thrill.  He has a palpable radial pulse.  Vascular surgery will be available as needed.  Gae Gallop, MD

## 2021-12-22 NOTE — Progress Notes (Signed)
Report given to HD RN 

## 2021-12-22 NOTE — Progress Notes (Signed)
Dialysis calling to get report. Pt on cleviprex gtt and unwell. Will hold off on sending pt until MDs give me further guidance.

## 2021-12-22 NOTE — Progress Notes (Signed)
Pt nauseated, vomiting. MD paged.

## 2021-12-22 NOTE — Progress Notes (Signed)
[facility] Progress Note  Subjective: Cleviprex off then restarted . Worst BP 189/80 yest. Have acute onset N/V overnight, w/u in progress today w/ planned abd CT and repeat MRI to r/o further insult. Pt seen in room, no c/o now.   Vitals:   12/22/21 0600 12/22/21 0615 12/22/21 0630 12/22/21 0750  BP: (!) 152/68 (!) 158/71 137/70   Pulse: 82 87 87   Resp: (!) 23 (!) 22 (!) 33   Temp:    97.8 F (36.6 C)  TempSrc:    Axillary  SpO2: 97% 100% 100%   Weight:      Height:        Exam: Gen: NAD, alert HEENT: anicteric sclera, MMM CV: normal rate, no murmurs, no edema Lungs: bilateral chest rise, normal wob Abd: soft, non-tender, non-distended Neuro: normal speech, no gross focal deficits  Dialysis access: RIJ TDC intact, new LUA AVG +bruit   Home meds: amlodipine 10, carvedilol 6.25 bid, furosemide 40 qd, hydralazine 25 tid, losartan 25 qd, aspirin, atrovastatin, insulin glargine, sl ntg, velphoro 500 ac tid   OP HD: [facility] MWF 4h  400/800   56.5kg  2/2.5 bath TDC  Hep 8000 - sensipar 30 tiw po - doxecalciferol 4 ug tiw IV - no esa/ Fe  Current BP meds: Norvasc 10 Hydralazine 50 tid Losartan 25 qd   Assessment/ Plan: Acute CVA - sp TNK, 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 Per neurology Acute N/V - last night overnight, CT abd pending and MRI as well.  HTN - has chronic HTN w/ possible nonadherence at times. Was off  Cleviprex but now back on. Cont norvasc 10 qd and hydralazine 50 tid. Resume home coreg at higher dose 12.5 bid, and ^ losartan to 50 qd.  Vol - No vol excess, under dry wt, 2L  UFG w/ hd today.  ESRD - usual HD MWF. HD today.  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 Not on ESA or iron, monitor for now.  MBD ckd - CCa in range, phos high. Continue sensipar and IV vdra w/ hd.  Cont velphoro as binder.  DM 2 -per primary  Rob Natalynn Pedone 12/22/2021, 9:21 AM   Recent Labs  Lab 12/18/21 1811 12/18/21 1822 12/20/21 0238  12/21/21 0418 12/21/21 0922 12/22/21 0626 12/22/21 0811  HGB 14.6   < > 13.5 12.9* 14.3 13.9 13.9  ALBUMIN 4.1  --   --   --   --   --   --   CALCIUM 8.7*  --  8.2* 7.9*  --  8.3*  --   PHOS  --   --  7.5*  --   --   --   --   CREATININE 7.57*   < > 10.24* 7.37* 8.50* 9.81*  --   K 4.0   < > 5.0 4.3 4.4 4.4  --    < > = values in this interval not displayed.    No results for input(s): "IRON", "TIBC", "FERRITIN" in the last 168 hours. Inpatient medications:  amLODipine  10 mg Oral Daily   aspirin EC  81 mg Oral Daily   atorvastatin  40 mg Oral Daily   Chlorhexidine Gluconate Cloth  6 each Topical Q0600   cinacalcet  30 mg Oral Q M,W,F   clopidogrel  75 mg Oral Daily   doxercalciferol  4 mcg Intravenous Q M,W,F-HD   hydrALAZINE  50 mg Oral TID   insulin aspart  0-6  Units Subcutaneous TID AC & HS   losartan  25 mg Oral Daily   pantoprazole  40 mg Oral Daily    anticoagulant sodium citrate     chlorproMAZINE (THORAZINE) 25 mg in sodium chloride 0.9 % 25 mL IVPB     clevidipine 10 mg/hr (12/22/21 0844)   acetaminophen **OR** acetaminophen (TYLENOL) oral liquid 160 mg/5 mL **OR** acetaminophen, alteplase, anticoagulant sodium citrate, chlorproMAZINE, heparin, lidocaine (PF), lidocaine-prilocaine, ondansetron (ZOFRAN) IV, pentafluoroprop-tetrafluoroeth, senna-docusate

## 2021-12-22 NOTE — Progress Notes (Signed)
Inpatient Rehab Admissions Coordinator:   Note events of today.  Have been unable to reach family to discuss caregiver support.   Shann Medal, PT, DPT Admissions Coordinator [phone] 12/22/21  3:01 PM

## 2021-12-22 NOTE — Progress Notes (Signed)
IV contrastfor CT A&P today OK'd by nephrologist Dr Jonnie Finner.

## 2021-12-22 NOTE — Progress Notes (Signed)
Cvc accessed per bprotocol w/o difficulty both ports aspirated and flushed well connected a-a v-v bfr acheived

## 2021-12-22 NOTE — Progress Notes (Signed)
HD will need to be done in pt's room d/t cleviprex gtt. Will attempt to get MRI (if nausea can be controled) before HD.

## 2021-12-22 NOTE — Progress Notes (Signed)
Pt still vomiting and nauseous. SBP are now >180. Paged MD. Ordered cleviprex.

## 2021-12-22 NOTE — Progress Notes (Signed)
PT Cancellation Note  Patient Details Name: Shawn Collins MRN: [id number] DOB: 1950/09/30   Cancelled Treatment:    Reason Eval/Treat Not Completed: Patient at procedure or test/unavailable. Pt currently receiving HD treatment, with plans to continue for >3 more hours. Will plan to follow-up tomorrow as able.    Moishe Spice, PT, DPT Acute Rehabilitation Services  Office: [phone]    Orvan Falconer 12/22/2021, 3:15 PM

## 2021-12-22 NOTE — Progress Notes (Addendum)
STROKE TEAM PROGRESS NOTE   INTERVAL HISTORY Patient is seen in his room with no family at the bedside.  HE has required Cleviprex to maintain his BP and complained of acute nausea starting at 0200 with dry heaves and hiccups.  Abdomen is nontender with hypoactive bowel sounds.  KUB and CT abdomen are negative for acute abnormality.  Thorazine given for hiccups and nausea with good results.  MRI pending.  Vitals:   12/22/21 1100 12/22/21 1115 12/22/21 1130 12/22/21 1409  BP: 139/80 (!) 150/73 136/65 (!) 145/87  Pulse: 88 93 91 99  Resp: 14 17 14 20   Temp:    98.2 F (36.8 C)  TempSrc:      SpO2: 93% 98% 98% 97%  Weight:    58.7 kg  Height:       CBC:  Recent Labs  Lab 12/18/21 1811 12/18/21 1822 12/22/21 0626 12/22/21 0811  WBC 6.8   < > 9.2 9.9  NEUTROABS 4.9  --   --   --   HGB 14.6   < > 13.9 13.9  HCT 43.9   < > 38.9* 40.4  MCV 96.3   < > 91.7 94.8  PLT 236   < > 242 234   < > = values in this interval not displayed.    Basic Metabolic Panel:  Recent Labs  Lab 12/19/21 0559 12/20/21 0238 12/21/21 0418 12/21/21 0922 12/22/21 0626  NA  --  137 133* 131* 131*  K  --  5.0 4.3 4.4 4.4  CL  --  101 93* 95* 95*  CO2  --  20* 27  --  16*  GLUCOSE  --  112* 127* 114* 182*  BUN  --  51* 31* 35* 46*  CREATININE  --  10.24* 7.37* 8.50* 9.81*  CALCIUM  --  8.2* 7.9*  --  8.3*  MG 2.2  --   --   --   --   PHOS  --  7.5*  --   --   --     Lipid Panel:  Recent Labs  Lab 12/19/21 0559  CHOL 160  TRIG 62  HDL 44  CHOLHDL 3.6  VLDL 12  LDLCALC 104*    HgbA1c:  Recent Labs  Lab 12/18/21 1950  HGBA1C 5.2    Alcohol Level  Recent Labs  Lab 12/18/21 1950  ETH <10    ________________________________________________________________  IMAGING past 48hrs  CT ABDOMEN PELVIS W CONTRAST  Result Date: 12/22/2021 CLINICAL DATA:  Abdominal pain, acute, nonlocalized EXAM: CT ABDOMEN AND PELVIS WITH CONTRAST TECHNIQUE: Multidetector CT imaging of the abdomen and  pelvis was performed using the standard protocol following bolus administration of intravenous contrast. RADIATION DOSE REDUCTION: This exam was performed according to the departmental dose-optimization program which includes automated exposure control, adjustment of the mA and/or kV according to patient size and/or use of iterative reconstruction technique. CONTRAST:  125mL OMNIPAQUE IOHEXOL 300 MG/ML  SOLN COMPARISON:  CT 10/16/2016 FINDINGS: Lower chest: No acute abnormality. Hepatobiliary: No focal liver abnormality is seen. There is some higher density material within the gallbladder, likely combination of sludge and vicarious excretion of contrast from prior CT exam. No wall thickening or pericholecystic stranding. Pancreas: Unremarkable. No pancreatic ductal dilatation or surrounding inflammatory changes. Spleen: Normal in size without focal abnormality. Adrenals/Urinary Tract: Adrenal glands are unremarkable. Persistent nephrograms with no significant excretion of contrast on delay compatible with history of end-stage renal disease. There is retained contrast material in the bladder from  prior contrast enhanced study. Stomach/Bowel: The stomach is within normal limits. There is no evidence of bowel obstruction. Moderate colonic stool burden.No evidence of appendicitis. Vascular/Lymphatic: Aortoiliac atherosclerosis. No AAA. No lymphadenopathy. Reproductive: Mildly enlarged prostate gland. Other: Trace free fluid in pelvis. 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 This is stable since at least May 2018, and on that exam was noted to be unchanged for at least 6 years and likely benign. Musculoskeletal: No acute osseous abnormality. No suspicious osseous lesion. Mild to moderate bilateral hip osteoarthritis. Multilevel degenerative changes of the spine. IMPRESSION: Moderate colonic stool burden suggesting constipation. No  evidence of bowel obstruction or other acute findings in the abdomen or pelvis. Electronically Signed   By: Maurine Simmering M.D.   On: 12/22/2021 12:28   DG Abd Portable 1V  Result Date: 12/22/2021 CLINICAL DATA:  Nausea EXAM: PORTABLE ABDOMEN - 1 VIEW COMPARISON:  None Available. FINDINGS: No dilated large or small bowel. Moderate volume stool in the rectum. No pathologic calcifications. No organomegaly IMPRESSION: 1. No bowel obstruction. 2. Moderate stool in the rectum. Electronically Signed   By: Suzy Bouchard M.D.   On: 12/22/2021 10:29   CT HEAD WO CONTRAST (5MM)  Result Date: 12/22/2021 CLINICAL DATA:  Stroke, follow-up.  Hypertension. EXAM: CT HEAD WITHOUT CONTRAST TECHNIQUE: Contiguous axial images were obtained from the base of the skull through the vertex without intravenous contrast. RADIATION DOSE REDUCTION: This exam was performed according to the departmental dose-optimization program which includes automated exposure control, adjustment of the mA and/or kV according to patient size and/or use of iterative reconstruction technique. COMPARISON:  CT and MRI studies 7/22 and 12/19/2021 FINDINGS: Brain: No acute finding by CT. Age related volume loss with chronic small-vessel ischemic changes of the cerebral hemispheric white matter. No cortical or large vessel territory infarction. No mass lesion, hemorrhage, hydrocephalus or extra-axial collection. No CT finding at the site of either punctate acute infarction suggested by MRI. Vascular: There is atherosclerotic calcification of the major vessels at the base of the brain. Skull: Negative Sinuses/Orbits: Clear/normal Other: None IMPRESSION: No acute CT finding. Atrophy and chronic small-vessel ischemic changes as seen previously. Vascular calcification. No CT finding at the site of either punctate acute infarction suggested by MRI. Electronically Signed   By: Nelson Chimes M.D.   On: 12/22/2021 08:35    ________________________________________________________________  PHYSICAL EXAM  Neurological Exam: Mental status/Cognition: Alert, oriented x4  Speech/language: Fluent, comprehension intact, object naming intact, repetition intact. Speech clear, no dysarthria  Cranial nerves:   CN II Pupils equal and reactive to light, no VF deficits    CN III,IV,VI EOM intact, no gaze preference or deviation, no further nystagmus noted on exam, denies diplopia.  No ptosis   CN V normal sensation in V1, V2, and V3 segments bilaterally    CN VII no asymmetry, no nasolabial fold flattening    CN VIII normal hearing to speech    CN IX & X normal palatal elevation, no uvular deviation    CN XI 5/5 head turn and 5/5 shoulder shrug bilaterally    CN XII midline tongue protrusion      Motor:  Muscle bulk and tone: normal Pronator drift: none Tremor: none  Strength: RUE:  Grip 5/5    biceps    5/5   triceps  5/5      LUE:  Grip 5/5    biceps  5/5  triceps  5/5 RLE:  Thigh 5/5    knee   5/5    plantar flexion  5/5   dorsiflexion  5/5        LLE:  Thigh 5/5    knee   5/5    plantar flexion  5/5   dorsiflexion  5/5   Reflexes: deferred   Sensation: FACE: normal,  ARMS: normal bil,  LEGS: normal bil   Coordination/Complex Motor:  - Finger to Nose intact BL - Heel to shin intact BL - Gait: deferred  ________________________________________________________________  ASSESSMENT/PLAN Shawn Collins is a 71 y.o. male with history of COPD, DM2, HTN, Hx of NSTEMI, CKD/ESRD, CAD presenting with sudden N/V and dizziness. S/p TNK. PT/OT currently recommending CIR.  Patient c/o acute nausea and hiccups starting at 0200 today.  Abdomen nontender with hypoactive bowel sounds.  KUB and CT abdomen negative.  Thorazine given with good results.  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  Moderate stenosis within the supraclinoid right ICA. Moderate stenosis within an inferior division proximal M2 right MCA vessel. MRI Brain 7/23:  6 mm acute infarct at the junction of the posterior pons and middle cerebellar peduncle on the right. 3 mm later subacute infarct within the PLIC 2D Echo EF > 54% LDL 104 A1C 5.2 UDS Negative VTE prophylaxis - SCD's aspirin 81 mg daily prior to admission, now on ASA and plavix DAPT for 3 weeks and then plavix alone.  PT/OT: CIR Deposition - pending  Hiccups/nausea, acute onset 7/26 Patient c/o hiccups and nausea with dry heaves Repeat CT 7/26 no acute abnormality Repeat MRI 7/26 pending KUB and CT abdomen negative Thorazine 25 mg IV x1 and 25 mg PO TID PRN Continue to monitor  Hypertension Home meds:  amlodipine 10mg /d, carvedilol 6.25mg  bid, furosamide 40mg /d, losartan 25mg /d On the high end now back on clevidipne IV Currently on amlodipine 10, coreg 12.5 bid, hydralazine 25->50-> 100 tid, losartan 25>50 daily Long-term BP goal normotensive  Hyperlipidemia Home meds:  atorvastatin 20mg /d  LDL 104, goal < 70 Now on atorvastatin 40mg  Continue statin at discharge  Diabetes type II  Well Controlled per A1C result Home meds:  glargine insulin 10u p.m. HgbA1c 5.2, goal < 7.0 CBGs:   Sliding Scale Insulin Close PCP follow-up for DM control  Other Stroke Risk Factors Advanced Age >/= 70  COPD/Emphysema: former Cigarette smoker Family hx stroke (father) Coronary artery disease s/p CABG  Other active problems ESRD on HD HD done 7/24 inpatient S/p new left arm AVF with VVS 7/25 QT prolongation, repeat EKG, now QTc within range.  Hospital day # 4  Patient seen and examined by NP/APP with MD. MD to update note as needed.   [place] , MSN, AGACNP-BC [facility] See Amion for schedule and pager information 12/22/2021 2:21 PM   ATTENDING NOTE: I reviewed above note and agree with the assessment and plan. Pt was seen and examined.   RN at bedside.  Patient lying in bed, in no acute distress with nausea, dry heaves and abdominal discomfort in waves.  Decreased bowel sounds.  Per RN and patient, he has no bowel movement since admission but currently has no  urge for bowel movement. Change laxative as needed to scheduled nightly.  He will be and CT abdomen pelvis negative for acute finding.  CT head no acute abnormality.  Currently on HD, will do MRI after to rule out new stroke.  Continue supportive care.  PT/OT recommend CIR.  For detailed assessment and plan, please refer to above/below as I have made changes wherever appropriate.   Rosalin Hawking, MD PhD Stroke Neurology 12/22/2021 4:52 PM  This patient is critically ill due to brainstem stroke, status post TNK, acute onset nausea vomiting and at significant risk of neurological worsening, death form recurrent stroke, hemorrhagic transformation, acute abdomen. This patient's care requires constant monitoring of vital signs, hemodynamics, respiratory and cardiac monitoring, review of multiple databases, neurological assessment, discussion with family, other specialists and medical decision making of high complexity. I spent 45 minutes of neurocritical care time in the care of this patient.    To contact Stroke Continuity provider, please refer to [email or web address] After hours, contact General Neurology

## 2021-12-22 NOTE — Progress Notes (Signed)
Called HD. HD RN on the way to dialyze pt.

## 2021-12-23 ENCOUNTER — Inpatient Hospital Stay (HOSPITAL_COMMUNITY): Payer: Medicare Other

## 2021-12-23 DIAGNOSIS — I6350 Cerebral infarction due to unspecified occlusion or stenosis of unspecified cerebral artery: Secondary | ICD-10-CM | POA: Diagnosis not present

## 2021-12-23 DIAGNOSIS — I160 Hypertensive urgency: Secondary | ICD-10-CM | POA: Diagnosis not present

## 2021-12-23 DIAGNOSIS — N186 End stage renal disease: Secondary | ICD-10-CM | POA: Diagnosis not present

## 2021-12-23 DIAGNOSIS — I639 Cerebral infarction, unspecified: Secondary | ICD-10-CM | POA: Diagnosis not present

## 2021-12-23 LAB — CBC
HCT: 36.6 % — ABNORMAL LOW (ref 39.0–52.0)
Hemoglobin: 12.4 g/dL — ABNORMAL LOW (ref 13.0–17.0)
MCH: 31.9 pg (ref 26.0–34.0)
MCHC: 33.9 g/dL (ref 30.0–36.0)
MCV: 94.1 fL (ref 80.0–100.0)
Platelets: 205 10*3/uL (ref 150–400)
RBC: 3.89 MIL/uL — ABNORMAL LOW (ref 4.22–5.81)
RDW: 13.2 % (ref 11.5–15.5)
WBC: 9 10*3/uL (ref 4.0–10.5)
nRBC: 0 % (ref 0.0–0.2)

## 2021-12-23 LAB — TRIGLYCERIDES: Triglycerides: 86 mg/dL (ref <150)

## 2021-12-23 LAB — GLUCOSE, CAPILLARY
Glucose-Capillary: 109 mg/dL — ABNORMAL HIGH (ref 70–99)
Glucose-Capillary: 109 mg/dL — ABNORMAL HIGH (ref 70–99)
Glucose-Capillary: 119 mg/dL — ABNORMAL HIGH (ref 70–99)
Glucose-Capillary: 99 mg/dL (ref 70–99)

## 2021-12-23 LAB — BASIC METABOLIC PANEL
Anion gap: 16 — ABNORMAL HIGH (ref 5–15)
BUN: 22 mg/dL (ref 8–23)
CO2: 22 mmol/L (ref 22–32)
Calcium: 8.3 mg/dL — ABNORMAL LOW (ref 8.9–10.3)
Chloride: 95 mmol/L — ABNORMAL LOW (ref 98–111)
Creatinine, Ser: 5.73 mg/dL — ABNORMAL HIGH (ref 0.61–1.24)
GFR, Estimated: 10 mL/min — ABNORMAL LOW (ref >60)
Glucose, Bld: 110 mg/dL — ABNORMAL HIGH (ref 70–99)
Potassium: 4.9 mmol/L (ref 3.5–5.1)
Sodium: 133 mmol/L — ABNORMAL LOW (ref 135–145)

## 2021-12-23 LAB — TROPONIN I (HIGH SENSITIVITY)
Troponin I (High Sensitivity): 59 ng/L — ABNORMAL HIGH (ref <18)
Troponin I (High Sensitivity): 59 ng/L — ABNORMAL HIGH (ref <18)

## 2021-12-23 MED ORDER — LOSARTAN POTASSIUM 25 MG PO TABS
25.0000 mg | ORAL_TABLET | Freq: Two times a day (BID) | ORAL | Status: DC
Start: 2021-12-23 — End: 2021-12-27
  Administered 2021-12-23 – 2021-12-27 (×7): 25 mg via ORAL
  Filled 2021-12-23 (×7): qty 1

## 2021-12-23 MED ORDER — LORAZEPAM 2 MG/ML IJ SOLN
2.0000 mg | Freq: Once | INTRAMUSCULAR | Status: AC
  Administered 2021-12-23: 2 mg via INTRAVENOUS
  Filled 2021-12-23: qty 1

## 2021-12-23 MED ORDER — ORAL CARE MOUTH RINSE: 15.0000 mL | OROMUCOSAL | Status: DC | PRN

## 2021-12-23 MED ORDER — CHLORHEXIDINE GLUCONATE CLOTH 2 % EX PADS
6.0000 | MEDICATED_PAD | Freq: Every day | CUTANEOUS | Status: DC
Start: 2021-12-23 — End: 2021-12-25
  Administered 2021-12-23: 6 via TOPICAL

## 2021-12-23 MED ORDER — HYDRALAZINE HCL 50 MG PO TABS
50.0000 mg | ORAL_TABLET | Freq: Three times a day (TID) | ORAL | Status: DC
  Administered 2021-12-23 – 2021-12-28 (×11): 50 mg via ORAL
  Filled 2021-12-23 (×12): qty 1

## 2021-12-23 MED ORDER — AMLODIPINE BESYLATE 5 MG PO TABS
5.0000 mg | ORAL_TABLET | Freq: Two times a day (BID) | ORAL | Status: DC
  Administered 2021-12-23 – 2021-12-27 (×7): 5 mg via ORAL
  Filled 2021-12-23 (×7): qty 1

## 2021-12-23 MED ORDER — LORAZEPAM 2 MG/ML IJ SOLN
INTRAMUSCULAR | Status: AC
  Filled 2021-12-23: qty 1

## 2021-12-23 MED ORDER — POLYETHYLENE GLYCOL 3350 17 G PO PACK
17.0000 g | PACK | Freq: Two times a day (BID) | ORAL | Status: DC
  Administered 2021-12-23 – 2021-12-26 (×3): 17 g via ORAL
  Filled 2021-12-23 (×8): qty 1

## 2021-12-23 NOTE — TOC CAGE-AID Note (Signed)
Transition of Care [facility]) - CAGE-AID Screening   Patient Details  Name: Shawn Collins MRN: [id number] Date of Birth: 10-08-1950  [facility] CM/SW Contact:    Coralee Pesa, [place] Phone Number: 12/23/2021, 1:02 PM   Clinical Narrative: CSW met with pt at bedside to complete CAGE- AID assessment. Pt lethargic during assessment, and falling asleep. Unable to complete assessment at this time.   CAGE-AID Screening: Substance Abuse Screening unable to be completed due to: : Patient unable to participate (Lethargic)

## 2021-12-23 NOTE — Progress Notes (Addendum)
[facility] Progress Note  Subjective: Cleviprex off today.   Vitals:   12/23/21 0700 12/23/21 0800 12/23/21 0900 12/23/21 1000  BP: (!) 154/73 (!) 158/71 (!) 160/81 (!) 160/74  Pulse: 81 83 81 82  Resp: 12 12 15  (!) 24  Temp:  98.5 F (36.9 C)    TempSrc:  Oral    SpO2: 95% 97% 97% 96%  Weight:    56 kg  Height:        Exam: Gen: NAD, alert HEENT: anicteric sclera, MMM CV: normal rate, no murmurs, no edema Lungs: bilateral chest rise, normal wob Abd: soft, non-tender, non-distended Neuro: normal speech, no gross focal deficits  Dialysis access: RIJ TDC intact, new LUA AVG +bruit   Home meds: amlodipine 10, carvedilol 6.25 bid, furosemide 40 qd, hydralazine 25 tid, losartan 25 qd, aspirin, atrovastatin, insulin glargine, sl ntg, velphoro 500 ac tid   OP HD: [facility] MWF 4h  400/800   56.5kg  2/2.5 bath TDC  Hep 8000 - sensipar 30 tiw po - doxecalciferol 4 ug tiw IV - no esa/ Fe  Current BP meds: Norvasc 5 bid Hydralazine 50 tid Losartan 25 bid Coreg 12.5 bid   Assessment/ Plan: Acute CVA - sp TNK, 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 Per neurology Acute N/V - last night overnight, CT abd pending and MRI as well.  HTN - has chronic HTN w/ possible nonadherence at times. Is off  Cleviprex this am. Cont 4 BP lowering meds as above, have switched once daily meds to bid for now and lowered hydralazine back to 50 tid.  Vol - No vol excess, at dry wt today (56kg standing this am). IVF's started at 75 cc/hr for low BP's. BP's are up again, will lower to 30 cc/ hr.  ESRD - usual HD MWF. HD tomorrow.  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 Not on ESA or iron, monitor for now.  MBD ckd - CCa in range, phos high. Continue sensipar and IV vdra w/ hd.  Cont velphoro as binder.  DM 2 -per primary  Shawn Collins 12/23/2021, 10:45 AM   Recent Labs  Lab 12/18/21 1811 12/18/21 1822 12/20/21 0238 12/21/21 0418 12/22/21 0626 12/22/21 0811  12/23/21 0455  HGB 14.6   < > 13.5   < > 13.9 13.9 12.4*  ALBUMIN 4.1  --   --   --   --   --   --   CALCIUM 8.7*  --  8.2*   < > 8.3*  --  8.3*  PHOS  --   --  7.5*  --   --   --   --   CREATININE 7.57*   < > 10.24*   < > 9.81*  --  5.73*  K 4.0   < > 5.0   < > 4.4  --  4.9   < > = values in this interval not displayed.    No results for input(s): "IRON", "TIBC", "FERRITIN" in the last 168 hours. Inpatient medications:  amLODipine  5 mg Oral BID   aspirin EC  81 mg Oral Daily   atorvastatin  40 mg Oral Daily   carvedilol  12.5 mg Oral BID WC   Chlorhexidine Gluconate Cloth  6 each Topical Q0600   cinacalcet  30 mg Oral Q M,W,F   clopidogrel  75 mg Oral Daily   doxercalciferol  4 mcg Intravenous Q M,W,F-HD   heparin injection (subcutaneous)  5,000  Units Subcutaneous Q8H   hydrALAZINE  100 mg Oral TID   insulin aspart  0-6 Units Subcutaneous TID AC & HS   losartan  25 mg Oral BID   pantoprazole  40 mg Oral Daily   senna-docusate  1 tablet Oral BID    sodium chloride 50 mL/hr at 12/23/21 1019   anticoagulant sodium citrate     clevidipine Stopped (12/23/21 0020)   acetaminophen **OR** acetaminophen (TYLENOL) oral liquid 160 mg/5 mL **OR** acetaminophen, alteplase, anticoagulant sodium citrate, chlorproMAZINE, heparin, heparin, lidocaine (PF), lidocaine-prilocaine, morphine injection, ondansetron (ZOFRAN) IV, oxyCODONE-acetaminophen, pentafluoroprop-tetrafluoroeth

## 2021-12-23 NOTE — Progress Notes (Signed)
RN notified by Marygrace Drought PT that patient is more lethargic and weak compared to previous PT sessions. Dr Erlinda Hong and Cortney de Yolanda Manges, NP notified. EKG and troponin lab orders put in. Dr Erlinda Hong states the overnight MRI had no new findings so unlikely patient will need another brain scan  at this time. Will continue to monitor patient.  Montez Hageman RN

## 2021-12-23 NOTE — Progress Notes (Addendum)
STROKE TEAM PROGRESS NOTE   INTERVAL HISTORY Patient is seen in his room with no family at the bedside.  His blood pressure has been stable off Cleviprex, and he no longer complains of nausea.  Hiccups persist.  Patient is more somnolent today but arouses to voice and is able to converse.  Vitals:   12/23/21 1000 12/23/21 1100 12/23/21 1200 12/23/21 1300  BP: (!) 160/74 (!) 146/71 119/64 122/68  Pulse: 82 78 72 72  Resp: (!) 24 11 16 17   Temp:   98.4 F (36.9 C)   TempSrc:   Oral   SpO2: 96% 97% 98% 97%  Weight: 56 kg     Height:       CBC:  Recent Labs  Lab 12/18/21 1811 12/18/21 1822 12/22/21 0811 12/23/21 0455  WBC 6.8   < > 9.9 9.0  NEUTROABS 4.9  --   --   --   HGB 14.6   < > 13.9 12.4*  HCT 43.9   < > 40.4 36.6*  MCV 96.3   < > 94.8 94.1  PLT 236   < > 234 205   < > = values in this interval not displayed.    Basic Metabolic Panel:  Recent Labs  Lab 12/19/21 0559 12/20/21 0238 12/21/21 0418 12/22/21 0626 12/23/21 0455  NA  --  137   < > 131* 133*  K  --  5.0   < > 4.4 4.9  CL  --  101   < > 95* 95*  CO2  --  20*   < > 16* 22  GLUCOSE  --  112*   < > 182* 110*  BUN  --  51*   < > 46* 22  CREATININE  --  10.24*   < > 9.81* 5.73*  CALCIUM  --  8.2*   < > 8.3* 8.3*  MG 2.2  --   --   --   --   PHOS  --  7.5*  --   --   --    < > = values in this interval not displayed.    Lipid Panel:  Recent Labs  Lab 12/19/21 0559 12/23/21 0455  CHOL 160  --   TRIG 62 86  HDL 44  --   CHOLHDL 3.6  --   VLDL 12  --   LDLCALC 104*  --     HgbA1c:  Recent Labs  Lab 12/18/21 1950  HGBA1C 5.2    Alcohol Level  Recent Labs  Lab 12/18/21 1950  ETH <10    ________________________________________________________________  IMAGING past 48hrs  MR BRAIN WO CONTRAST  Result Date: 12/23/2021 CLINICAL DATA:  Follow-up examination for stroke. EXAM: MRI HEAD WITHOUT CONTRAST TECHNIQUE: Multiplanar, multiecho pulse sequences of the brain and surrounding  structures were obtained without intravenous contrast. COMPARISON:  Prior MRI from 12/19/2021. FINDINGS: Brain: Previously identified 6 mm acute ischemic infarct at the right cerebellar peduncle again seen, stable in size and appearance from prior. No associated hemorrhage or mass effect. Additional previously question subacute ischemic infarct at the posterior limb of the left internal capsule is not visualized on this exam. No other new areas of acute or interval infarction. Underlying atrophy with chronic small vessel ischemic disease again noted. Multiple remote lacunar infarcts noted about the hemispheric cerebral white matter, deep gray nuclei, and left cerebellum. No acute intracranial hemorrhage. Few subcentimeter chronic micro hemorrhages involving the left cerebellum and right occipital lobe noted, stable. No mass lesion,  mass effect, or midline shift. No hydrocephalus or extra-axial fluid collection. Pituitary gland and suprasellar region within normal limits. Vascular: Absent flow void within the left V4 segment, stable, suspected to be occluded. Major intracranial vascular flow voids are otherwise maintained. Skull and upper cervical spine: Craniocervical junction within normal limits. Bone marrow signal intensity normal. Prominent degenerative spondylosis at C3-4 without high-grade spinal stenosis. No scalp soft tissue abnormality. Sinuses/Orbits: Globes and orbital soft tissues within normal limits. Mild mucosal thickening noted about the ethmoidal air cells and maxillary sinuses. Mastoid air cells are clear. Other: None. IMPRESSION: 1. Stable size and appearance of evolving 6 mm acute ischemic infarct at the right cerebellar peduncle. No associated hemorrhage or mass effect. 2. Additional previously questioned subacute ischemic infarct at the posterior limb of the left internal capsule is not visualized on this exam. 3. No other new infarct or other abnormality identified. 4. Absent flow void within  the left V4 segment, consistent with occlusion, stable. 5. Underlying atrophy with chronic small vessel ischemic disease with multiple remote lacunar infarcts as above, stable. Electronically Signed   By: Jeannine Boga M.D.   On: 12/23/2021 04:11   CT ABDOMEN PELVIS W CONTRAST  Result Date: 12/22/2021 CLINICAL DATA:  Abdominal pain, acute, nonlocalized EXAM: CT ABDOMEN AND PELVIS WITH CONTRAST TECHNIQUE: Multidetector CT imaging of the abdomen and pelvis was performed using the standard protocol following bolus administration of intravenous contrast. RADIATION DOSE REDUCTION: This exam was performed according to the departmental dose-optimization program which includes automated exposure control, adjustment of the mA and/or kV according to patient size and/or use of iterative reconstruction technique. CONTRAST:  112mL OMNIPAQUE IOHEXOL 300 MG/ML  SOLN COMPARISON:  CT 10/16/2016 FINDINGS: Lower chest: No acute abnormality. Hepatobiliary: No focal liver abnormality is seen. There is some higher density material within the gallbladder, likely combination of sludge and vicarious excretion of contrast from prior CT exam. No wall thickening or pericholecystic stranding. Pancreas: Unremarkable. No pancreatic ductal dilatation or surrounding inflammatory changes. Spleen: Normal in size without focal abnormality. Adrenals/Urinary Tract: Adrenal glands are unremarkable. Persistent nephrograms with no significant excretion of contrast on delay compatible with history of end-stage renal disease. There is retained contrast material in the bladder from prior contrast enhanced study. Stomach/Bowel: The stomach is within normal limits. There is no evidence of bowel obstruction. Moderate colonic stool burden.No evidence of appendicitis. Vascular/Lymphatic: Aortoiliac atherosclerosis. No AAA. No lymphadenopathy. Reproductive: Mildly enlarged prostate gland. Other: Trace free fluid in pelvis. 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 This is stable since at least May 2018, and on that exam was noted to be unchanged for at least 6 years and likely benign. Musculoskeletal: No acute osseous abnormality. No suspicious osseous lesion. Mild to moderate bilateral hip osteoarthritis. Multilevel degenerative changes of the spine. IMPRESSION: Moderate colonic stool burden suggesting constipation. No evidence of bowel obstruction or other acute findings in the abdomen or pelvis. Electronically Signed   By: Maurine Simmering M.D.   On: 12/22/2021 12:28   DG Abd Portable 1V  Result Date: 12/22/2021 CLINICAL DATA:  Nausea EXAM: PORTABLE ABDOMEN - 1 VIEW COMPARISON:  None Available. FINDINGS: No dilated large or small bowel. Moderate volume stool in the rectum. No pathologic calcifications. No organomegaly IMPRESSION: 1. No bowel obstruction. 2. Moderate stool in the rectum. Electronically Signed   By: Suzy Bouchard M.D.   On: 12/22/2021 10:29   CT HEAD WO CONTRAST (  5MM)  Result Date: 12/22/2021 CLINICAL DATA:  Stroke, follow-up.  Hypertension. EXAM: CT HEAD WITHOUT CONTRAST TECHNIQUE: Contiguous axial images were obtained from the base of the skull through the vertex without intravenous contrast. RADIATION DOSE REDUCTION: This exam was performed according to the departmental dose-optimization program which includes automated exposure control, adjustment of the mA and/or kV according to patient size and/or use of iterative reconstruction technique. COMPARISON:  CT and MRI studies 7/22 and 12/19/2021 FINDINGS: Brain: No acute finding by CT. Age related volume loss with chronic small-vessel ischemic changes of the cerebral hemispheric white matter. No cortical or large vessel territory infarction. No mass lesion, hemorrhage, hydrocephalus or extra-axial collection. No CT finding at the site of either punctate acute infarction suggested by MRI. Vascular: There  is atherosclerotic calcification of the major vessels at the base of the brain. Skull: Negative Sinuses/Orbits: Clear/normal Other: None IMPRESSION: No acute CT finding. Atrophy and chronic small-vessel ischemic changes as seen previously. Vascular calcification. No CT finding at the site of either punctate acute infarction suggested by MRI. Electronically Signed   By: Nelson Chimes M.D.   On: 12/22/2021 08:35   ________________________________________________________________  PHYSICAL EXAM  Neurological Exam: Mental status/Cognition: Drowsy, oriented x2  Speech/language: Fluent, comprehension intact, object naming intact, repetition intact. Speech clear, no dysarthria  Cranial nerves:   CN II Pupils equal and reactive to light, no VF deficits    CN III,IV,VI EOM intact, no gaze preference or deviation, no further nystagmus noted on exam, denies diplopia.  No ptosis   CN V normal sensation in V1, V2, and V3 segments bilaterally    CN VII no asymmetry, no nasolabial fold flattening    CN VIII normal hearing to speech    CN IX & X normal palatal elevation, no uvular deviation    CN XI 5/5 head turn and 5/5 shoulder shrug bilaterally    CN XII midline tongue protrusion      Motor:  Muscle bulk and tone: normal Pronator drift: none Tremor: none  Strength: RUE:  Grip 5/5    biceps    5/5   triceps  5/5      LUE:  Grip 5/5    biceps  5/5   triceps  5/5 RLE:  Thigh 5/5    knee   5/5    plantar flexion  5/5   dorsiflexion  5/5        LLE:  Thigh 5/5    knee   5/5    plantar flexion  5/5   dorsiflexion  5/5   Reflexes: deferred   Sensation: FACE: normal,  ARMS: normal bil,  LEGS: normal bil   Coordination/Complex Motor:  - Finger to Nose intact BL - Gait: deferred  ________________________________________________________________  ASSESSMENT/PLAN [name] is a 71 y.o. male with history of COPD, DM2, HTN, Hx of NSTEMI, CKD/ESRD, CAD presenting with sudden N/V and dizziness. S/p  TNK. PT/OT currently recommending CIR.  Patient c/o acute nausea and hiccups starting at 0200 yesterday.  Abdomen nontender with hypoactive bowel sounds.  KUB and CT abdomen negative.  Thorazine given with good results.  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  Moderate stenosis within the supraclinoid right ICA.  Moderate stenosis within an inferior division proximal M2 right MCA vessel. MRI Brain 7/23:  6 mm acute infarct at the junction of the posterior pons and middle cerebellar peduncle on the right. 3 mm later subacute infarct within the PLIC MRI brain 5/59 - stable right cerebellar peduncle infarct, previous small left PLIC infarct not visualized. 2D Echo EF > 75% LDL 104 A1C 5.2 UDS Negative VTE prophylaxis - heparin subcu aspirin 81 mg daily prior to admission, now on ASA and plavix DAPT for 3 weeks and then plavix alone.  PT/OT: CIR Deposition - pending  Hiccups/nausea, acute onset 7/26 Patient c/o hiccups and nausea with dry heaves Repeat CT 7/26 no acute abnormality Repeat MRI 7/26 no new acute abnormalities KUB and CT abdomen negative Thorazine 25 mg IV x1 and 25 mg PO TID PRN Continue to monitor  Hypertension Home meds:  amlodipine 10mg /d, carvedilol 6.25mg  bid, furosamide 40mg /d, losartan 25mg /d On the high end now back on clevidipne IV Currently on amlodipine 5, coreg 12.5 bid, hydralazine 25->50 tid, losartan 25 daily Long-term BP goal normotensive  Hyperlipidemia Home meds:  atorvastatin 20mg /d  LDL 104, goal < 70 Now on atorvastatin 40mg  Continue statin at discharge  Diabetes type II  Well Controlled per A1C result Home meds:  glargine insulin 10u  p.m. HgbA1c 5.2, goal < 7.0 CBGs:   Sliding Scale Insulin Close PCP follow-up for DM control  Other Stroke Risk Factors Advanced Age >/= 42  COPD/Emphysema: former Cigarette smoker Family hx stroke (father) Coronary artery disease s/p CABG  Other active problems ESRD on HD HD done 7/24 inpatient S/p new left arm AVF with VVS 7/25 QT prolongation, repeat EKG, now QTc within range. Lethargy - EKG, troponin negative  Hospital day # 5  Patient seen and examined by NP/APP with MD. MD to update note as needed.   [place] , MSN, AGACNP-BC [facility] See Amion for schedule and pager information 12/23/2021 1:46 PM  ATTENDING NOTE: I reviewed above note and agree with the assessment and plan. Pt was seen and examined.   No family at bedside.  Patient overnight no acute event.  This morning patient still lethargic, mildly drowsy, less interactive, however orientated and answer questions, neurologically unchanged.  Denies abdominal pain, nausea vomiting but he does have intermittent hiccup.  MRI and repeat last night showed no new findings.  EKG, troponin level negative.  Off Cleviprex, now on p.o. BP meds.  We will continue monitoring.  Plan for HD tomorrow and CIR placement after.  For detailed assessment and plan, please refer to above/below as I have made changes wherever appropriate.   Rosalin Hawking, MD PhD Stroke Neurology 12/23/2021 10:45 PM   This patient is critically ill due to cerebellar peduncle infarct, unspecific nausea vomiting and hiccup, status post TNK and at significant risk of neurological worsening, death form recurrent stroke, hemorrhagic transformation. This patient's care requires constant monitoring of vital signs, hemodynamics, respiratory and cardiac monitoring, review of multiple databases, neurological assessment, discussion with family, other specialists and medical decision making of high complexity. I spent 30 minutes of neurocritical  care time in the care of this patient.     To contact Stroke Continuity provider, please refer to [email or web address] After hours, contact General Neurology

## 2021-12-23 NOTE — Progress Notes (Signed)
Physical Therapy Treatment Patient Details Name: Shawn Collins MRN: [id number] DOB: [birth year] Today's Date: 12/23/2021   History of Present Illness Pt is a 71 y.o. male who presented 12/18/21 with sudden N/V and dizziness. Pt administered TNK. 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  Had L UE AV fistula creation for HD on 12/21/21.  PMH: COPD, DM2, HTN, Hx of NSTEMI, CKD/ESRD, CAD    PT Comments    Pt has had a functional decline. During his PT eval by this PT 12/20/21, he did show some L sided incoordination but fairly good strength bil and had a L lean that had since corrected. Pt had only been needing min guard assist to ambulate with a RW ~60 ft as of 12/21/21. Today, pt is barely opening his eyes for a brief moment before closing again, drooling with asymmetry noted at the mouth, losing balance sitting EOB, and requiring TA for bed mobility and maxA to stand. He was unable to wake up enough to walk and L upper and lower extremity weakness was evident with poor grip strength and L knee buckle upon standing. When asked why he was so tired today he was able to verbalize "I have not eaten" but that is all he said during the session, the rest was garbled speech. Noted his BP supine to be 132/69 and BP sitting to be 112/61, but otherwise VSS on RA. RN and MD notified of these concerns. Will continue to follow acutely. Current recommendations remain appropriate.    Recommendations for follow up therapy are one component of a multi-disciplinary discharge planning process, led by the attending physician.  Recommendations may be updated based on patient status, additional functional criteria and insurance authorization.  Follow Up  Recommendations  Acute inpatient rehab (3hours/day)     Assistance Recommended at Discharge Frequent or constant Supervision/Assistance  Patient can return home with the following Assistance with cooking/housework;Direct supervision/assist for medications management;Direct supervision/assist for financial management;Assist for transportation;Help with stairs or ramp for entrance;A lot of help with walking and/or transfers;A lot of help with bathing/dressing/bathroom   Equipment Recommendations  Rolling walker (2 wheels);BSC/3in1 (pending progress)    Recommendations for Other Services       Precautions / Restrictions Precautions Precautions: Fall Precaution Comments: New AV fistula L UE Restrictions Weight Bearing Restrictions: No     Mobility  Bed Mobility Overal bed mobility: Needs Assistance Bed Mobility: Supine to Sit, Sit to Supine     Supine to sit: Total assist, HOB elevated Sit to supine: Total assist, HOB elevated   General bed mobility comments: Pt not initiating or assisting with bed mobility, very lethargic, TA to complete.    Transfers Overall transfer level: Needs assistance Equipment used: 1 person hand held assist Transfers: Sit to/from Stand Sit to Stand: Max assist           General transfer comment: Pt demonstrating some activation through his legs to stand, but needing maxA to power up to stand, transition weight anteriorly, and provide stability. Knee buckle noted on L coming to stand.    Ambulation/Gait               General Gait Details: unable to take any steps today despite max cues and maxA, L knee buckle noted  Stairs             Wheelchair Mobility    Modified Rankin (Stroke Patients Only) Modified Rankin (Stroke Patients Only) Pre-Morbid Rankin Score: No symptoms Modified Rankin: Severe disability     Balance Overall balance assessment: Needs assistance Sitting-balance support: Bilateral upper extremity  supported, Feet supported Sitting balance-Leahy Scale: Poor Sitting balance - Comments: Pt with trunk sway and LOB in all directions, needing min guard-modA to recover/prevent LOB   Standing balance support: Bilateral upper extremity supported Standing balance-Leahy Scale: Zero Standing balance comment: Reliant on UE support and maxA, L knee buckle noted                            Cognition Arousal/Alertness: Lethargic Behavior During Therapy: Flat affect Overall Cognitive Status: Difficult to assess                                 General Comments: Difficult to assess as pt is very lethargic today, only opening eyes briefly on occasion with max cues and stimulation, even when dependently or maximally assisted to upright positions. Pt also drooling. Poor awareness to correct for sitting balance or sequencing to take steps at EOB today.        Exercises      General Comments General comments (skin integrity, edema, etc.): BP 132/69 supine, 112/61 standing, otherwise VSS on RA; noted increased L UE and leg weakness than on initial eval; noted drooling and symmetry at mouth      Pertinent Vitals/Pain Pain Assessment Pain Assessment: Faces Faces Pain Scale: Hurts a little bit Pain Location: L UE Pain Descriptors / Indicators: Grimacing Pain Intervention(s): Limited activity within patient's tolerance, Monitored during session, Repositioned    Home Living                          Prior Function            PT Goals (current goals can now be found in the care plan section) Acute Rehab PT Goals Patient Stated Goal: to eat PT Goal Formulation: Patient unable to participate in goal setting Time For Goal Achievement: 01/03/22 Potential to Achieve Goals: Good Progress towards PT goals: Not progressing toward goals - comment (functional decline)    Frequency    Min 4X/week      PT Plan Current plan remains appropriate    Co-evaluation               AM-PAC PT "6 Clicks" Mobility   Outcome Measure  Help needed turning from your back to your side while in a flat bed without using bedrails?: Total Help needed moving from lying on your back to sitting on the side of a flat bed without using bedrails?: Total Help needed moving to and from a bed to a chair (including a wheelchair)?: Total Help needed standing up from a chair using your arms (e.g., wheelchair or bedside chair)?: A Lot Help needed to walk in hospital room?: Total Help needed climbing 3-5 steps with a railing? : Total 6 Click Score: 7    End of Session   Activity Tolerance: Patient limited by lethargy Patient left: in bed;with call bell/phone within reach;with bed alarm set Nurse Communication: Mobility status;Other (comment) (notified RN and MD of this change in status) PT Visit Diagnosis: Other abnormalities of gait and mobility (R26.89);Dizziness  and giddiness (R42);Difficulty in walking, not elsewhere classified (R26.2);Muscle weakness (generalized) (M62.81);Other symptoms and signs involving the nervous system (R29.898);Unsteadiness on feet (R26.81)     Time: 4784-1282 PT Time Calculation (min) (ACUTE ONLY): 25 min  Charges:  $Therapeutic Activity: 23-37 mins                     Moishe Spice, PT, DPT Acute Rehabilitation Services  Office: [phone]    Orvan Falconer 12/23/2021, 1:07 PM

## 2021-12-24 DIAGNOSIS — N186 End stage renal disease: Secondary | ICD-10-CM | POA: Diagnosis not present

## 2021-12-24 DIAGNOSIS — I639 Cerebral infarction, unspecified: Secondary | ICD-10-CM | POA: Diagnosis not present

## 2021-12-24 DIAGNOSIS — I6350 Cerebral infarction due to unspecified occlusion or stenosis of unspecified cerebral artery: Secondary | ICD-10-CM | POA: Diagnosis not present

## 2021-12-24 DIAGNOSIS — I160 Hypertensive urgency: Secondary | ICD-10-CM | POA: Diagnosis not present

## 2021-12-24 LAB — BASIC METABOLIC PANEL
Anion gap: 17 — ABNORMAL HIGH (ref 5–15)
BUN: 44 mg/dL — ABNORMAL HIGH (ref 8–23)
CO2: 23 mmol/L (ref 22–32)
Calcium: 8.4 mg/dL — ABNORMAL LOW (ref 8.9–10.3)
Chloride: 95 mmol/L — ABNORMAL LOW (ref 98–111)
Creatinine, Ser: 8.16 mg/dL — ABNORMAL HIGH (ref 0.61–1.24)
GFR, Estimated: 6 mL/min — ABNORMAL LOW (ref >60)
Glucose, Bld: 83 mg/dL (ref 70–99)
Potassium: 4.9 mmol/L (ref 3.5–5.1)
Sodium: 135 mmol/L (ref 135–145)

## 2021-12-24 LAB — CBC
HCT: 39.4 % (ref 39.0–52.0)
Hemoglobin: 13.2 g/dL (ref 13.0–17.0)
MCH: 31.8 pg (ref 26.0–34.0)
MCHC: 33.5 g/dL (ref 30.0–36.0)
MCV: 94.9 fL (ref 80.0–100.0)
Platelets: 265 10*3/uL (ref 150–400)
RBC: 4.15 MIL/uL — ABNORMAL LOW (ref 4.22–5.81)
RDW: 13.2 % (ref 11.5–15.5)
WBC: 9.2 10*3/uL (ref 4.0–10.5)
nRBC: 0 % (ref 0.0–0.2)

## 2021-12-24 LAB — GLUCOSE, CAPILLARY
Glucose-Capillary: 97 mg/dL (ref 70–99)
Glucose-Capillary: 135 mg/dL — ABNORMAL HIGH (ref 70–99)

## 2021-12-24 MED ORDER — FLEET ENEMA 7-19 GM/118ML RE ENEM
1.0000 | ENEMA | Freq: Once | RECTAL | Status: DC
Start: 2021-12-24 — End: 2021-12-25

## 2021-12-24 MED ORDER — SUCROFERRIC OXYHYDROXIDE 500 MG PO CHEW
500.0000 mg | CHEWABLE_TABLET | Freq: Three times a day (TID) | ORAL | Status: DC
  Administered 2021-12-25 – 2021-12-28 (×9): 500 mg via ORAL
  Filled 2021-12-24 (×16): qty 1

## 2021-12-24 MED ORDER — DOCUSATE SODIUM 283 MG RE ENEM
1.0000 | ENEMA | Freq: Once | RECTAL | Status: DC
Start: 2021-12-24 — End: 2021-12-25
  Filled 2021-12-24: qty 1

## 2021-12-24 MED ORDER — FLEET ENEMA 7-19 GM/118ML RE ENEM: 1.0000 | ENEMA | Freq: Once | RECTAL | Status: DC

## 2021-12-24 NOTE — Progress Notes (Signed)
Occupational Therapy Treatment Patient Details Name: Shawn Collins MRN: [id number] DOB: 27-Dec-1950 Today's Date: 12/24/2021   History of present illness Pt is a 71 y.o. male who presented 12/18/21 with sudden N/V and dizziness. Pt administered TNK. 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  Had L UE AV fistula creation for HD on 12/21/21.  PMH: COPD, DM2, HTN, Hx of NSTEMI, CKD/ESRD, CAD   OT comments  This 71 yo male admitted with above, seen with PT today due to yesterday pt needed extensive A (which he had not needed on days prior). Pt doing overall better today with needing less A than yesterday. He was able to come up to sit with minguard A, ambulate with Min A +2 (mainly for safety and equipment), wipe mouth/face while sitting EOB, and Mod A to donn socks while seated EOB. He will continue to benefit from acute OT with follow up on AIR.   Recommendations for follow up therapy are one component of a multi-disciplinary discharge planning process, led by the attending physician.  Recommendations may be updated based on patient status, additional functional criteria and insurance authorization.    Follow Up Recommendations  Acute inpatient rehab (3hours/day)    Assistance Recommended at Discharge Frequent or constant Supervision/Assistance  Patient can return home with the following  A little help with walking and/or transfers;A little help with bathing/dressing/bathroom;Assistance with cooking/housework;Direct supervision/assist for medications management;Direct supervision/assist for financial management;Assist for transportation;Help with stairs or ramp for entrance   Equipment Recommendations  Other (comment) (TBD next  venue)    Recommendations for Other Services Rehab consult    Precautions / Restrictions Precautions Precautions: Fall Precaution Comments: New AV fistula L UE Restrictions Weight Bearing Restrictions: No       Mobility Bed Mobility Overal bed mobility: Needs Assistance Bed Mobility: Supine to Sit, Sit to Supine     Supine to sit: HOB elevated, Min guard Sit to supine: HOB elevated, Min guard   General bed mobility comments: Pt needing increased time and use of bed rail with HOB elevated to transition supine <> sit EOB, min guard for safety. Trunk sway noted coming to sit. Difficulty noted lifting legs returning to supine.    Transfers Overall transfer level: Needs assistance Equipment used: Rolling walker (2 wheels) Transfers: Sit to/from Stand Sit to Stand: Min assist, +2 safety/equipment           General transfer comment: Cues provided to bring feet apart and posterior and push up with R UE off bed or chair to stand, minA to power up and steady, x2 reps.     Balance Overall balance assessment: Needs assistance Sitting-balance support: Bilateral upper extremity supported, Feet supported, Single extremity supported, No upper extremity supported Sitting balance-Leahy Scale: Fair Sitting balance - Comments: Able to reach off BOS to donn socks EOB with min guard-minA, but LOB to L needing minA to recover when sitting statically. Postural control: Left lateral lean Standing balance support: Bilateral upper extremity supported Standing balance-Leahy Scale: Poor Standing balance comment: Reliant on UE support and up to minA                           ADL either performed or assessed with clinical judgement  ADL Overall ADL's : Needs assistance/impaired     Grooming: Set up;Supervision/safety;Wash/dry face;Sitting               Lower Body Dressing: Moderate assistance Lower Body Dressing Details (indicate cue type and reason): to donn socks while  seated EOB, had to start both socks for him then he was able to pull them the rest of the way up with decreased balance (leaning without LOB) Toilet Transfer: Minimal assistance;+2 for safety/equipment;Rolling walker (2 wheels) Toilet Transfer Details (indicate cue type and reason): Simulated bed to window and then from recliner around bed to get back in bed for dialysis                Extremity/Trunk Assessment Upper Extremity Assessment Upper Extremity Assessment: Generalized weakness            Vision Baseline Vision/History: 0 No visual deficits            Cognition Arousal/Alertness: Lethargic Behavior During Therapy: Flat affect Overall Cognitive Status: Impaired/Different from baseline Area of Impairment: Memory, Safety/judgement, Awareness, Problem solving                     Memory: Decreased short-term memory   Safety/Judgement: Decreased awareness of deficits, Decreased awareness of safety Awareness: Emergent Problem Solving: Slow processing, Difficulty sequencing, Requires verbal cues General Comments: Pt lethargic, closing eyes often, but able to arouse easily and keep eyes open when cued. Pt with increased trunk sway compared to earlier treatment sessions and needing increased cues to be aware of his leaning and correct it, delayed reactional responses noted.              General Comments SBP 166 supine, BP 139/82 sitting, BP 139/80 sitting after ambulating, BP 169/76 supine end of session; denied dizziness today    Pertinent Vitals/ Pain       Pain Assessment Pain Assessment: Faces Faces Pain Scale: Hurts a little bit Pain Location: L UE Pain Descriptors / Indicators: Grimacing, Discomfort Pain Intervention(s): Monitored during session, Limited activity within patient's tolerance, Repositioned         Frequency  Min 2X/week        Progress Toward Goals  OT Goals(current goals can now be found in the care plan section)  Progress  towards OT goals: Progressing toward goals  Acute Rehab OT Goals Patient Stated Goal: to get stronger OT Goal Formulation: With patient Time For Goal Achievement: 01/03/22  Plan Discharge plan remains appropriate    Co-evaluation    PT/OT/SLP Co-Evaluation/Treatment: Yes Reason for Co-Treatment: For patient/therapist safety;To address functional/ADL transfers (pt needed increased A yesterday) PT goals addressed during session: Mobility/safety with mobility OT goals addressed during session: ADL's and self-care;Strengthening/ROM      AM-PAC OT "6 Clicks" Daily Activity     Outcome Measure   Help from another person eating meals?: None Help from another person taking care of personal grooming?: A Little Help from another person toileting, which includes using toliet, bedpan, or urinal?: A Lot Help from another person bathing (including washing, rinsing, drying)?: A Little Help from another person to put on and taking off regular upper body clothing?: A Little Help from another person to put on and taking off regular lower body clothing?: A Lot 6 Click Score: 17    End of Session Equipment Utilized During Treatment: Gait belt;Rolling walker (2 wheels)  OT Visit Diagnosis: Unsteadiness on feet (R26.81);Other abnormalities of gait and mobility (R26.89);Muscle weakness (generalized) (M62.81)   Activity  Tolerance Patient tolerated treatment well   Patient Left in bed;with call bell/phone within reach;with bed alarm set   Nurse Communication Mobility status        Time: 8938-1017 OT Time Calculation (min): 23 min  Charges: OT General Charges $OT Visit: 1 Visit OT Treatments $Self Care/Home Management : 8-22 mins  Golden Circle, OTR/L Acute Rehab [facility] [phone] Office [phone]    Almon Register 12/24/2021, 11:40 AM

## 2021-12-24 NOTE — Progress Notes (Signed)
Physical Therapy Treatment Patient Details Name: Shawn Collins MRN: [id number] DOB: 29-Apr-1951 Today's Date: 12/24/2021   History of Present Illness Pt is a 71 y.o. male who presented 12/18/21 with sudden N/V and dizziness. Pt administered TNK. 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  Had L UE AV fistula creation for HD on 12/21/21.  PMH: COPD, DM2, HTN, Hx of NSTEMI, CKD/ESRD, CAD    PT Comments    Pt with much improved arousal, ability to participate, strength, and stability today compared to yesterday. Pt is again requiring min guard assist for bed mobility and minA for transfers and bedroom distance mobility with a RW, displaying a L lateral bias. Pt limited today by muscular fatigue, needing to rest often. Will continue to follow acutely. Current recommendations remain appropriate.     Recommendations for follow up therapy are one component of a multi-disciplinary discharge planning process, led by the attending physician.  Recommendations may be updated based on patient status, additional functional criteria and insurance authorization.  Follow Up Recommendations  Acute inpatient rehab (3hours/day)     Assistance Recommended at Discharge Frequent or constant Supervision/Assistance  Patient can return home with the following Assistance with cooking/housework;Direct supervision/assist for medications management;Direct supervision/assist for financial management;Assist for transportation;Help with stairs or ramp for entrance;A little help with walking and/or transfers;A little help with bathing/dressing/bathroom   Equipment Recommendations  Rolling walker (2 wheels);BSC/3in1    Recommendations for Other Services        Precautions / Restrictions Precautions Precautions: Fall Precaution Comments: New AV fistula L UE Restrictions Weight Bearing Restrictions: No     Mobility  Bed Mobility Overal bed mobility: Needs Assistance Bed Mobility: Supine to Sit, Sit to Supine     Supine to sit: HOB elevated, Min guard Sit to supine: HOB elevated, Min guard   General bed mobility comments: Pt needing increased time and use of bed rail with HOB elevated to transition supine <> sit EOB, min guard for safety. Trunk sway noted coming to sit. Difficulty noted lifting legs returning to supine.    Transfers Overall transfer level: Needs assistance Equipment used: Rolling walker (2 wheels) Transfers: Sit to/from Stand Sit to Stand: Min assist, +2 safety/equipment           General transfer comment: Cues provided to bring feet apart and posterior and push up with R UE off bed or chair to stand, minA to power up and steady, x2 reps.    Ambulation/Gait Ambulation/Gait assistance: Min assist, +2 safety/equipment Gait Distance (Feet): 20 Feet (x2 bouts of ~12 ft > ~20 ft) Assistive device: Rolling walker (2 wheels) Gait Pattern/deviations: Step-through pattern, Decreased stride length, Drifts right/left, Trunk flexed Gait velocity: reduced Gait velocity interpretation: <1.31 ft/sec, indicative of household ambulator   General Gait Details: Pt with L bias inside RW, needing repeated cues to improve upright posture. MinA for stability.   Stairs             Wheelchair Mobility    Modified Rankin (Stroke Patients Only) Modified Rankin (Stroke Patients Only) Pre-Morbid Rankin Score: No symptoms Modified Rankin: Severe disability     Balance Overall balance assessment: Needs assistance Sitting-balance support: Bilateral upper extremity supported, Feet supported, Single extremity supported, No upper extremity supported Sitting balance-Leahy Scale:  Fair Sitting balance - Comments: Able to  reach off BOS to donn socks EOB with min guard-minA, but LOB to L needing minA to recover when sitting statically. Postural control: Left lateral lean Standing balance support: Bilateral upper extremity supported Standing balance-Leahy Scale: Poor Standing balance comment: Reliant on UE support and up to minA                            Cognition Arousal/Alertness: Lethargic Behavior During Therapy: Flat affect Overall Cognitive Status: Impaired/Different from baseline Area of Impairment: Memory, Safety/judgement, Awareness, Problem solving                     Memory: Decreased short-term memory   Safety/Judgement: Decreased awareness of deficits, Decreased awareness of safety Awareness: Emergent Problem Solving: Slow processing, Difficulty sequencing, Requires verbal cues General Comments: Pt lethargic, closing eyes often, but able to arouse easily and keep eyes open when cued. Pt with increased trunk sway compared to earlier treatment sessions and needing increased cues to be aware of his leaning and correct it, delayed reactional responses noted.        Exercises      General Comments General comments (skin integrity, edema, etc.): SBP 166 supine, BP 139/82 sitting, BP 139/80 sitting after ambulating, BP 169/76 supine end of session; denied dizziness today      Pertinent Vitals/Pain Pain Assessment Pain Assessment: Faces Faces Pain Scale: Hurts a little bit Pain Location: L UE Pain Descriptors / Indicators: Grimacing, Discomfort Pain Intervention(s): Monitored during session, Limited activity within patient's tolerance, Repositioned    Home Living                          Prior Function            PT Goals (current goals can now be found in the care plan section) Acute Rehab PT Goals Patient Stated Goal: to eat PT Goal Formulation: With patient Time For Goal Achievement: 01/03/22 Potential to Achieve Goals: Good Progress towards PT  goals: Progressing toward goals    Frequency    Min 4X/week      PT Plan Current plan remains appropriate    Co-evaluation PT/OT/SLP Co-Evaluation/Treatment: Yes Reason for Co-Treatment: For patient/therapist safety;To address functional/ADL transfers PT goals addressed during session: Mobility/safety with mobility;Balance;Proper use of DME        AM-PAC PT "6 Clicks" Mobility   Outcome Measure  Help needed turning from your back to your side while in a flat bed without using bedrails?: A Little Help needed moving from lying on your back to sitting on the side of a flat bed without using bedrails?: A Little Help needed moving to and from a bed to a chair (including a wheelchair)?: A Little Help needed standing up from a chair using your arms (e.g., wheelchair or bedside chair)?: A Little Help needed to walk in hospital room?: A Little Help needed climbing 3-5 steps with a railing? : Total 6 Click Score: 16    End of Session Equipment Utilized During Treatment: Gait belt Activity Tolerance: Patient limited by lethargy;Patient tolerated treatment well Patient left: in bed;with call bell/phone within reach;with bed alarm set   PT Visit Diagnosis: Other abnormalities of gait and mobility (R26.89);Dizziness and giddiness (R42);Difficulty in walking, not elsewhere classified (R26.2);Muscle weakness (generalized) (M62.81);Other symptoms and signs involving the nervous system (R29.898);Unsteadiness on feet (R26.81)     Time: 9449-6759 PT  Time Calculation (min) (ACUTE ONLY): 23 min  Charges:  $Gait Training: 8-22 mins                     Moishe Spice, PT, DPT Acute Rehabilitation Services  Office: [place] 12/24/2021, 10:00 AM

## 2021-12-24 NOTE — Progress Notes (Addendum)
STROKE TEAM PROGRESS NOTE   INTERVAL HISTORY Patient is seen in his room, laying in bed with no family at the bedside. He complains of intermittent nausea but no dizziness. He states that he needs to have a bowel movement. Patient remains lethargic today but improved mental status from yesterday.   Vitals:   12/24/21 0800 12/24/21 0900 12/24/21 1000 12/24/21 1200  BP: (!) 154/65  (!) 157/70   Pulse: 73  72   Resp: 19  15   Temp: 98.5 F (36.9 C)   98.2 F (36.8 C)  TempSrc: Oral   Oral  SpO2: 97%  98%   Weight:  57.1 kg    Height:       CBC:  Recent Labs  Lab 12/18/21 1811 12/18/21 1822 12/23/21 0455 12/24/21 0358  WBC 6.8   < > 9.0 9.2  NEUTROABS 4.9  --   --   --   HGB 14.6   < > 12.4* 13.2  HCT 43.9   < > 36.6* 39.4  MCV 96.3   < > 94.1 94.9  PLT 236   < > 205 265   < > = values in this interval not displayed.    Basic Metabolic Panel:  Recent Labs  Lab 12/19/21 0559 12/20/21 0238 12/21/21 0418 12/23/21 0455 12/24/21 0358  NA  --  137   < > 133* 135  K  --  5.0   < > 4.9 4.9  CL  --  101   < > 95* 95*  CO2  --  20*   < > 22 23  GLUCOSE  --  112*   < > 110* 83  BUN  --  51*   < > 22 44*  CREATININE  --  10.24*   < > 5.73* 8.16*  CALCIUM  --  8.2*   < > 8.3* 8.4*  MG 2.2  --   --   --   --   PHOS  --  7.5*  --   --   --    < > = values in this interval not displayed.    Lipid Panel:  Recent Labs  Lab 12/19/21 0559 12/23/21 0455  CHOL 160  --   TRIG 62 86  HDL 44  --   CHOLHDL 3.6  --   VLDL 12  --   LDLCALC 104*  --     HgbA1c:  Recent Labs  Lab 12/18/21 1950  HGBA1C 5.2    Alcohol Level  Recent Labs  Lab 12/18/21 1950  ETH <10    ________________________________________________________________  IMAGING past 48hrs  MR BRAIN WO CONTRAST  Result Date: 12/23/2021 CLINICAL DATA:  Follow-up examination for stroke. EXAM: MRI HEAD WITHOUT CONTRAST TECHNIQUE: Multiplanar, multiecho pulse sequences of the brain and surrounding structures  were obtained without intravenous contrast. COMPARISON:  Prior MRI from 12/19/2021. FINDINGS: Brain: Previously identified 6 mm acute ischemic infarct at the right cerebellar peduncle again seen, stable in size and appearance from prior. No associated hemorrhage or mass effect. Additional previously question subacute ischemic infarct at the posterior limb of the left internal capsule is not visualized on this exam. No other new areas of acute or interval infarction. Underlying atrophy with chronic small vessel ischemic disease again noted. Multiple remote lacunar infarcts noted about the hemispheric cerebral white matter, deep gray nuclei, and left cerebellum. No acute intracranial hemorrhage. Few subcentimeter chronic micro hemorrhages involving the left cerebellum and right occipital lobe noted, stable. No mass lesion,  mass effect, or midline shift. No hydrocephalus or extra-axial fluid collection. Pituitary gland and suprasellar region within normal limits. Vascular: Absent flow void within the left V4 segment, stable, suspected to be occluded. Major intracranial vascular flow voids are otherwise maintained. Skull and upper cervical spine: Craniocervical junction within normal limits. Bone marrow signal intensity normal. Prominent degenerative spondylosis at C3-4 without high-grade spinal stenosis. No scalp soft tissue abnormality. Sinuses/Orbits: Globes and orbital soft tissues within normal limits. Mild mucosal thickening noted about the ethmoidal air cells and maxillary sinuses. Mastoid air cells are clear. Other: None. IMPRESSION: 1. Stable size and appearance of evolving 6 mm acute ischemic infarct at the right cerebellar peduncle. No associated hemorrhage or mass effect. 2. Additional previously questioned subacute ischemic infarct at the posterior limb of the left internal capsule is not visualized on this exam. 3. No other new infarct or other abnormality identified. 4. Absent flow void within the left V4  segment, consistent with occlusion, stable. 5. Underlying atrophy with chronic small vessel ischemic disease with multiple remote lacunar infarcts as above, stable. Electronically Signed   By: Jeannine Boga M.D.   On: 12/23/2021 04:11   ________________________________________________________________  PHYSICAL EXAM  Neurological Exam: Mental status/Cognition: Drowsy, oriented x2 Speech/language: Fluent, comprehension intact, object naming intact, repetition intact. Speech clear, no dysarthria  Cranial nerves:   CN II Pupils equal and reactive to light, no VF deficits    CN III,IV,VI EOM intact, no gaze preference or deviation, no further nystagmus noted on exam, denies diplopia.  No ptosis   CN V normal sensation in V1, V2, and V3 segments bilaterally    CN VII no asymmetry, no nasolabial fold flattening    CN VIII normal hearing to speech    CN IX & X normal palatal elevation, no uvular deviation    CN XI 5/5 head turn and 5/5 shoulder shrug bilaterally    CN XII midline tongue protrusion      Motor:  Muscle bulk and tone: normal Pronator drift: none Tremor: none  Strength: RUE:  Grip 5/5    biceps    5/5   triceps  5/5      LUE:  Grip 5/5    biceps  5/5   triceps  5/5 RLE:  Thigh 5/5    knee   5/5    plantar flexion  5/5   dorsiflexion  5/5        LLE:  Thigh 5/5    knee   5/5    plantar flexion  5/5   dorsiflexion  5/5   Reflexes: deferred   Sensation: FACE: normal,  ARMS: normal bil,  LEGS: normal bil   Coordination/Complex Motor:  - Finger to Nose intact BL - Gait: deferred  ________________________________________________________________  ASSESSMENT/PLAN Mr. [place] Homewood is a 71 y.o. male with history of COPD, DM2, HTN, Hx of NSTEMI, CKD/ESRD, CAD presenting with sudden N/V and dizziness. S/p TNK. PT/OT currently recommending CIR.  Patient c/o ongoing nausea and needs to have BM and there has been ongoing lethargy on neurologic assessments.  Abdomen nontender  with hypoactive bowel sounds.   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  Moderate stenosis within the supraclinoid right ICA. Moderate  stenosis within an inferior division proximal M2 right MCA vessel. MRI Brain 7/23:  6 mm acute infarct at the junction of the posterior pons and middle cerebellar peduncle on the right. 3 mm later subacute infarct within the PLIC MRI brain 4/65 - stable right cerebellar peduncle infarct, previous small left PLIC infarct not visualized. 2D Echo EF > 75% LDL 104 A1C 5.2 UDS Negative VTE prophylaxis - heparin subcu aspirin 81 mg daily prior to admission, now on ASA and plavix DAPT for 3 weeks and then plavix alone.  PT/OT: CIR->now SNF Deposition - pending  Hiccups/nausea, acute onset, resolved 7/26 Patient c/o hiccups and nausea with dry heaves, ongoing nausea 7/28 without hiccups Repeat CT 7/26 no acute abnormality Repeat MRI 7/26 no new acute abnormalities KUB and CT abdomen negative Thorazine 25 mg IV x1 and 25 mg PO TID PRN Continue to monitor  Hypertension Home meds:  amlodipine 10mg /d, carvedilol 6.25mg  bid, furosamide 40mg /d, losartan 25mg /d On the high end Off of clevidipne IV since 7/27 Currently on amlodipine 5 bid, coreg 12.5 bid, hydralazine 25->50 tid, losartan 25 bid Long-term BP goal normotensive  Hyperlipidemia Home meds:  atorvastatin 20mg /d  LDL 104, goal < 70 Now on atorvastatin 40mg  Continue statin at discharge  Diabetes type II  Well Controlled per A1C result Home meds:  glargine insulin 10u p.m. HgbA1c 5.2, goal < 7.0 CBGs:  83 Sliding Scale Insulin Close PCP follow-up for DM control  Constipation  On  senokote bid Fleet enema today Encourage po intake Gentle IVF  Other Stroke Risk Factors Advanced Age >/= 66  COPD/Emphysema: former Cigarette smoker Family hx stroke (father) Coronary artery disease s/p CABG  Other active problems ESRD on HD HD done 7/24 inpatient, to be complete again today 7/28 S/p new left arm AVF with VVS 7/25 QT prolongation, repeat EKG, now QTc within range. Lethargy - EKG, troponin negative  Hospital day # 6  Patient seen and examined by NP/APP with MD. MD to update note as needed.   Rikki Spearing , MSN, AGACNP-BC [facility] See Amion for schedule and pager information 12/24/2021 12:46 PM  ATTENDING NOTE: I reviewed above note and agree with the assessment and plan. Pt was seen and examined.   No family at bedside.  Patient lying in bed, still lethargic but no complaints, denies headache, dizziness, nausea vomiting.  Still has no bowel movement, will give Fleet enema.  No nystagmus seen, moving all extremities.  BP stable on the high end, nephrology is managing.  Plan for HD today.  Encourage p.o. intake.  Continue DAPT and statin.  PT therapy recommend CIR, however, family not able to be contacted.  Likely need SNF.  For detailed assessment and plan, please refer to above/below as I have made changes wherever appropriate.   Rosalin Hawking, MD PhD Stroke Neurology 12/24/2021 5:51 PM  This patient is critically ill due to brainstem stroke, status post TNK, episode of nausea vomiting dizziness, ESRD on dialysis and at significant risk of neurological worsening, death form recurrent stroke, hemorrhagic transformation, hypertensive encephalopathy. This patient's care requires constant monitoring of vital signs, hemodynamics, respiratory and cardiac monitoring, review of multiple databases, neurological assessment, discussion with family, other specialists and medical decision making of high complexity. I spent 35 minutes of neurocritical care time  in the care of this patient.      To contact Stroke Continuity provider, please refer to [email or web address] After hours, contact General Neurology

## 2021-12-24 NOTE — Progress Notes (Signed)
Inpatient Rehab Admissions Coordinator:   Still unable to reach any family to discuss caregiver support.  If pt medically ready to d/c may require ST SNF.  Dr. Erlinda Hong aware.   Shann Medal, PT, DPT Admissions Coordinator [phone] 12/24/21  12:51 PM

## 2021-12-24 NOTE — Progress Notes (Signed)
[facility] Progress Note  Subjective: Cleviprex off today.   Vitals:   12/24/21 0700 12/24/21 0800 12/24/21 0900 12/24/21 1000  BP: (!) 163/77 (!) 154/65  (!) 157/70  Pulse: 80 73  72  Resp: 18 19  15   Temp:  98.5 F (36.9 C)    TempSrc:  Oral    SpO2: 98% 97%  98%  Weight:   57.1 kg   Height:        Exam: Gen: NAD, alert HEENT: anicteric sclera, MMM CV: normal rate, no murmurs, no edema Lungs: bilateral chest rise, normal wob Abd: soft, non-tender, non-distended Neuro: normal speech, no gross focal deficits  Dialysis access: RIJ TDC intact, new LUA AVG +bruit   Home meds: amlodipine 10, carvedilol 6.25 bid, furosemide 40 qd, hydralazine 25 tid, losartan 25 qd, aspirin, atrovastatin, insulin glargine, sl ntg, velphoro 500 ac tid   OP HD: [facility] MWF 4h  400/800   56.5kg  2/2.5 bath TDC  Hep 8000 - sensipar 30 tiw po - doxecalciferol 4 ug tiw IV - no esa/ Fe  Current BP meds: Norvasc 5 bid Hydralazine 50 tid Losartan 25 bid Coreg 12.5 bid   Assessment/ Plan: Acute CVA - sp TNK, 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 Per neurology Acute N/V - last night overnight, CT abd pending and MRI as well. Constipation - per abd CT, changed Fleet's (has phoshporous in it) enema to thereavac mini-enemia. Getting senokot bid.  HTN - has chronic HTN w/ possible nonadherence at times. Remains off Cleviprex. Cont 4 BP lowering meds as above. Once daily meds changed to bid, this is for better fine tuning of BP for the next 2-3 days.  Vol - No vol excess, at dry wt today. IVF's started for low BPs.  BP's stable now will dc IVF.   ESRD - usual HD MWF. 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 Not on ESA or iron, monitor for now.  MBD ckd - CCa in range, phos high. Continue sensipar and IV vdra w/ hd.  Cont velphoro as binder.  DM 2 -per primary  Rob Xzavian Semmel 12/24/2021, 10:22 AM   Recent Labs  Lab 12/18/21 1811 12/18/21 1822 12/20/21 0238  12/21/21 0418 12/23/21 0455 12/24/21 0358  HGB 14.6   < > 13.5   < > 12.4* 13.2  ALBUMIN 4.1  --   --   --   --   --   CALCIUM 8.7*  --  8.2*   < > 8.3* 8.4*  PHOS  --   --  7.5*  --   --   --   CREATININE 7.57*   < > 10.24*   < > 5.73* 8.16*  K 4.0   < > 5.0   < > 4.9 4.9   < > = values in this interval not displayed.    No results for input(s): "IRON", "TIBC", "FERRITIN" in the last 168 hours. Inpatient medications:  amLODipine  5 mg Oral BID   aspirin EC  81 mg Oral Daily   atorvastatin  40 mg Oral Daily   carvedilol  12.5 mg Oral BID WC   Chlorhexidine Gluconate Cloth  6 each Topical Q0600   Chlorhexidine Gluconate Cloth  6 each Topical Q0600   cinacalcet  30 mg Oral Q M,W,F   clopidogrel  75 mg Oral Daily   doxercalciferol  4 mcg Intravenous Q M,W,F-HD   heparin injection (subcutaneous)  5,000 Units Subcutaneous  Q8H   hydrALAZINE  50 mg Oral TID   insulin aspart  0-6 Units Subcutaneous TID AC & HS   losartan  25 mg Oral BID   pantoprazole  40 mg Oral Daily   polyethylene glycol  17 g Oral BID   senna-docusate  1 tablet Oral BID    sodium chloride 30 mL/hr at 12/24/21 1000   anticoagulant sodium citrate     clevidipine Stopped (12/23/21 0020)   acetaminophen **OR** acetaminophen (TYLENOL) oral liquid 160 mg/5 mL **OR** acetaminophen, alteplase, anticoagulant sodium citrate, chlorproMAZINE, heparin, heparin, lidocaine (PF), lidocaine-prilocaine, morphine injection, ondansetron (ZOFRAN) IV, mouth rinse, oxyCODONE-acetaminophen, pentafluoroprop-tetrafluoroeth

## 2021-12-24 NOTE — Progress Notes (Signed)
Patient received from Dialysis. Report received earlier from [place]. Patient placed on telemetry, dinner ordered, and oriented to room and unit

## 2021-12-24 NOTE — Progress Notes (Signed)
Received patient in bed, alert and oriented. Informed consent signed and in chart.  Tx duration: 1756  HD treatment completed. Patient tolerated well. Right HD catheter without signs and symptoms of complications. Patient transported back to the room, alert and orient and in no acute distress. Report given to bedside RN.  Total UF removed: 1000 mL, 79.5L cleaned.   Medication given: n/a  Post HD VS: 163/82, 81, 16, 97.6, 99   Post HD weight: 54.4kg

## 2021-12-25 DIAGNOSIS — I639 Cerebral infarction, unspecified: Secondary | ICD-10-CM | POA: Diagnosis not present

## 2021-12-25 LAB — GLUCOSE, CAPILLARY
Glucose-Capillary: 109 mg/dL — ABNORMAL HIGH (ref 70–99)
Glucose-Capillary: 146 mg/dL — ABNORMAL HIGH (ref 70–99)
Glucose-Capillary: 129 mg/dL — ABNORMAL HIGH (ref 70–99)
Glucose-Capillary: 143 mg/dL — ABNORMAL HIGH (ref 70–99)

## 2021-12-25 MED ORDER — DOCUSATE SODIUM 283 MG RE ENEM
1.0000 | ENEMA | Freq: Every day | RECTAL | Status: AC
Start: 2021-12-25 — End: 2021-12-27
  Administered 2021-12-25: 283 mg via RECTAL
  Filled 2021-12-25 (×2): qty 1

## 2021-12-25 NOTE — Progress Notes (Signed)
Physical Therapy Treatment Patient Details Name: Shawn Collins MRN: [id number] DOB: Nov 19, 1950 Today's Date: 12/25/2021   History of Present Illness Pt is a 71 y.o. male who presented 12/18/21 with sudden N/V and dizziness. Pt administered TNK. 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  Had L UE AV fistula creation for HD on 12/21/21.  PMH: COPD, DM2, HTN, Hx of NSTEMI, CKD/ESRD, CAD    PT Comments    Pt received in bed, on 2L O2. Initially stating he was too tired and winded to participate in therapy but agreeable with encouragement. With 2L O2 in place, standing trials bedside with RW as well as LE ROM sitting EOB. SpO2 maintained at 97%. Pt requesting gait trial, "I want to see if I can walk." Amb in room multi laps with RW on RA. SpO2 96% upon return to EOB. Pt reporting mild SOB so 2L replaced upon return to bed. Pt reports the walking made him feel better. He shared that he used to walk miles a day for exercise and really enjoyed the walk. Pt supine in bed at end of session.  He required min guard assist bed mobility, min assist transfers, and min assist amb 63' with RW.     Recommendations for follow up therapy are one component of a multi-disciplinary discharge planning process, led by the attending physician.  Recommendations may be updated based on patient status, additional functional criteria and insurance authorization.  Follow Up Recommendations  Acute inpatient rehab (3hours/day)     Assistance Recommended at Discharge Frequent or constant Supervision/Assistance  Patient can return home with the following Assistance with cooking/housework;Direct supervision/assist for medications management;Direct  supervision/assist for financial management;Assist for transportation;Help with stairs or ramp for entrance;A little help with walking and/or transfers;A little help with bathing/dressing/bathroom   Equipment Recommendations  Rolling walker (2 wheels);BSC/3in1    Recommendations for Other Services       Precautions / Restrictions Precautions Precautions: Fall Precaution Comments: New AV fistula L UE     Mobility  Bed Mobility Overal bed mobility: Needs Assistance Bed Mobility: Supine to Sit, Sit to Supine     Supine to sit: HOB elevated, Min guard Sit to supine: HOB elevated, Min guard   General bed mobility comments: +rail, increased time    Transfers Overall transfer level: Needs assistance Equipment used: Rolling walker (2 wheels) Transfers: Sit to/from Stand Sit to Stand: Min assist           General transfer comment: cues for hand placement and sequencing, assist to power up and stabilize balance    Ambulation/Gait Ambulation/Gait assistance: Min assist Gait Distance (Feet): 70 Feet Assistive device: Rolling walker (2 wheels) Gait Pattern/deviations: Step-through pattern, Decreased stride length, Trunk flexed, Drifts right/left Gait velocity: decreased Gait velocity interpretation: <1.31 ft/sec, indicative of household ambulator   General Gait Details: assist to maintain balance and manage RW during turns, mild L bias   Stairs             Wheelchair Mobility    Modified Rankin (Stroke Patients Only) Modified Rankin (Stroke Patients Only) Pre-Morbid Rankin Score: No symptoms Modified Rankin: Moderately severe disability     Balance Overall balance assessment: Needs assistance Sitting-balance support: Feet supported, No upper extremity supported Sitting balance-Leahy Scale: Fair  Standing balance support: Bilateral upper extremity supported, During functional activity, Reliant on assistive device for balance Standing balance-Leahy Scale:  Poor                              Cognition Arousal/Alertness: Lethargic Behavior During Therapy: Flat affect Overall Cognitive Status: Impaired/Different from baseline Area of Impairment: Memory, Safety/judgement, Awareness, Problem solving                     Memory: Decreased short-term memory   Safety/Judgement: Decreased awareness of deficits, Decreased awareness of safety Awareness: Emergent Problem Solving: Slow processing, Difficulty sequencing, Requires verbal cues          Exercises      General Comments General comments (skin integrity, edema, etc.): Pt on 2L O2 on arrival, SpO2 100%. Amb on RA with SpO2 96%. Pt reporting mild SOB. 2L replaced after return to supine to assist with recovery.      Pertinent Vitals/Pain Pain Assessment Pain Assessment: Faces Faces Pain Scale: Hurts little more Pain Location: L UE Pain Descriptors / Indicators: Grimacing, Discomfort Pain Intervention(s): Monitored during session, Repositioned, Limited activity within patient's tolerance    Home Living                          Prior Function            PT Goals (current goals can now be found in the care plan section) Acute Rehab PT Goals Patient Stated Goal: to walk Progress towards PT goals: Progressing toward goals    Frequency    Min 4X/week      PT Plan Current plan remains appropriate    Co-evaluation              AM-PAC PT "6 Clicks" Mobility   Outcome Measure  Help needed turning from your back to your side while in a flat bed without using bedrails?: A Little Help needed moving from lying on your back to sitting on the side of a flat bed without using bedrails?: A Little Help needed moving to and from a bed to a chair (including a wheelchair)?: A Little Help needed standing up from a chair using your arms (e.g., wheelchair or bedside chair)?: A Little Help needed to walk in hospital room?: A Little Help needed  climbing 3-5 steps with a railing? : Total 6 Click Score: 16    End of Session Equipment Utilized During Treatment: Gait belt Activity Tolerance: Patient tolerated treatment well Patient left: in bed;with call bell/phone within reach;with bed alarm set Nurse Communication: Mobility status PT Visit Diagnosis: Other abnormalities of gait and mobility (R26.89);Dizziness and giddiness (R42);Difficulty in walking, not elsewhere classified (R26.2);Muscle weakness (generalized) (M62.81);Other symptoms and signs involving the nervous system (R29.898);Unsteadiness on feet (R26.81)     Time: 6222-9798 PT Time Calculation (min) (ACUTE ONLY): 25 min  Charges:  $Gait Training: 23-37 mins                     Lorrin Goodell, [place]  Office # [phone] Pager [phone]    Lorriane Shire 12/25/2021, 3:11 PM

## 2021-12-25 NOTE — Progress Notes (Signed)
[facility] Progress Note  Subjective: pt transferred out of ICU to floor yesterday.   Vitals:   12/24/21 1948 12/24/21 2341 12/25/21 0424 12/25/21 0819  BP: (!) 169/71 (!) 146/69 (!) 158/78 (!) 160/73  Pulse: 88 86  79  Resp: 18 20 17 20   Temp: 98.8 F (37.1 C) 98.5 F (36.9 C) 98 F (36.7 C) 98.4 F (36.9 C)  TempSrc: Oral Oral Oral Oral  SpO2: 100% 100% 100% 100%  Weight:      Height:        Exam: Gen: NAD, alert HEENT: anicteric sclera, MMM CV: normal rate, no murmurs, no edema Lungs: bilateral chest rise, normal wob Abd: soft, non-tender, non-distended Neuro: normal speech, no gross focal deficits  Dialysis access: RIJ TDC intact, new LUA AVG +bruit   Home meds: amlodipine 10, carvedilol 6.25 bid, furosemide 40 qd, hydralazine 25 tid, losartan 25 qd, aspirin, atrovastatin, insulin glargine, sl ntg, velphoro 500 ac tid   OP HD: [facility] MWF 4h  400/800   56.5kg  2/2.5 bath TDC  Hep 8000 - sensipar 30 tiw po - doxecalciferol 4 ug tiw IV - no esa/ Fe  Current BP meds: Norvasc 5 bid - 1/2 doses given yest Coreg 12.5 bid - 0/2 doses given yest Hydralazine 50 tid - 1/3 doses given yest Losartan 25 bid - 1/2 doses given yest    Assessment/ Plan: Acute CVA - sp TNK, 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 Per neurology Constipation - per abd CT, changed Fleet's (has phoshporous in it) enema to thereavac mini-enemia. Getting senokot bid.  HTN - has chronic HTN w/ possible nonadherence at times. SP Cleviprex gtt. Once daily meds were changed to bid, this is for better fine tuning of BP.  He missed many doses yest due to afternoon HD (pre HD BP med doses are held). Getting all doses today, BP 150/ 70 range.  His BP meds have hold orders for SBP < 120-125.  Vol - no vol excess on exam, 2kg under dry.  ESRD - usual HD MWF. Next HD Monday.  HD access - [facility]VS placed a new  LFA AVG here on 7/25 (pt has had only [facility] for access). Appreciate assistance.  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 MBD ckd - CCa in range, phos high. Continue sensipar and IV vdra w/ hd.  Cont velphoro as binder.  DM 2 -per primary  Rob Daley Mooradian 12/25/2021, 12:06 PM   Recent Labs  Lab 12/18/21 1811 12/18/21 1822 12/20/21 0238 12/21/21 0418 12/23/21 0455 12/24/21 0358  HGB 14.6   < > 13.5   < > 12.4* 13.2  ALBUMIN 4.1  --   --   --   --   --   CALCIUM 8.7*  --  8.2*   < > 8.3* 8.4*  PHOS  --   --  7.5*  --   --   --   CREATININE 7.57*   < > 10.24*   < > 5.73* 8.16*  K 4.0   < > 5.0   < > 4.9 4.9   < > = values in this interval not displayed.    No results for input(s): "IRON", "TIBC", "FERRITIN" in the last 168 hours. Inpatient medications:  amLODipine  5 mg Oral BID   aspirin EC  81 mg Oral Daily   atorvastatin  40 mg Oral Daily   carvedilol  12.5 mg Oral BID WC   Chlorhexidine Gluconate Cloth  6 each Topical Q0600   cinacalcet  30 mg Oral Q M,W,F   clopidogrel  75 mg Oral Daily   docusate sodium  1 enema Rectal Once   doxercalciferol  4 mcg Intravenous Q M,W,F-HD   heparin injection (subcutaneous)  5,000 Units Subcutaneous Q8H   hydrALAZINE  50 mg Oral TID   insulin aspart  0-6 Units Subcutaneous TID AC & HS   losartan  25 mg Oral BID   pantoprazole  40 mg Oral Daily   polyethylene glycol  17 g Oral BID   senna-docusate  1 tablet Oral BID   sodium phosphate  1 enema Rectal Once   sucroferric oxyhydroxide  500 mg Oral TID WC    clevidipine Stopped (12/23/21 0020)   acetaminophen **OR** acetaminophen (TYLENOL) oral liquid 160 mg/5 mL **OR** acetaminophen, chlorproMAZINE, morphine injection, ondansetron (ZOFRAN) IV, mouth rinse, oxyCODONE-acetaminophen

## 2021-12-25 NOTE — Progress Notes (Signed)
STROKE TEAM PROGRESS NOTE   INTERVAL HISTORY Patient is seen in his room, laying in bed with no family at the bedside.  No BM yet. Nephrology changes fleet's enema due to phos. No nausea/vomiting today.   Vitals:   12/24/21 2341 12/25/21 0424 12/25/21 0819 12/25/21 1218  BP: (!) 146/69 (!) 158/78 (!) 160/73 (!) 161/74  Pulse: 86  79 79  Resp: 20 17 20 16   Temp: 98.5 F (36.9 C) 98 F (36.7 C) 98.4 F (36.9 C) 98.1 F (36.7 C)  TempSrc: Oral Oral Oral Oral  SpO2: 100% 100% 100% 100%  Weight:      Height:       CBC:  Recent Labs  Lab 12/18/21 1811 12/18/21 1822 12/23/21 0455 12/24/21 0358  WBC 6.8   < > 9.0 9.2  NEUTROABS 4.9  --   --   --   HGB 14.6   < > 12.4* 13.2  HCT 43.9   < > 36.6* 39.4  MCV 96.3   < > 94.1 94.9  PLT 236   < > 205 265   < > = values in this interval not displayed.    Basic Metabolic Panel:  Recent Labs  Lab 12/19/21 0559 12/20/21 0238 12/21/21 0418 12/23/21 0455 12/24/21 0358  NA  --  137   < > 133* 135  K  --  5.0   < > 4.9 4.9  CL  --  101   < > 95* 95*  CO2  --  20*   < > 22 23  GLUCOSE  --  112*   < > 110* 83  BUN  --  51*   < > 22 44*  CREATININE  --  10.24*   < > 5.73* 8.16*  CALCIUM  --  8.2*   < > 8.3* 8.4*  MG 2.2  --   --   --   --   PHOS  --  7.5*  --   --   --    < > = values in this interval not displayed.    Lipid Panel:  Recent Labs  Lab 12/19/21 0559 12/23/21 0455  CHOL 160  --   TRIG 62 86  HDL 44  --   CHOLHDL 3.6  --   VLDL 12  --   LDLCALC 104*  --     HgbA1c:  Recent Labs  Lab 12/18/21 1950  HGBA1C 5.2    Alcohol Level  Recent Labs  Lab 12/18/21 1950  ETH <10    ________________________________________________________________  IMAGING past 48hrs  No results found. ________________________________________________________________  PHYSICAL EXAM  Laying on his right side.  Neurological Exam: Mental status/Cognition: awake but not energetic oriented to person and  place. Speech/language: Fluent, comprehension intact, object naming intact, repetition intact. Speech clear, no dysarthria  Cranial nerves:   CN II Pupils equal and reactive to light, no VF deficits    CN III,IV,VI EOM intact, no gaze preference or deviation,  denies diplopia.  No ptosis   CN V normal sensation in V1, V2, and V3 segments bilaterally    CN VII no asymmetry, no nasolabial fold flattening    CN VIII normal hearing to speech    CN IX & X normal palatal elevation, no uvular deviation    CN XI 5/5 head turn and 5/5 shoulder shrug bilaterally    CN XII midline tongue protrusion      Motor:  Muscle bulk and tone: normal Pronator drift: none Tremor:  none  Strength: RUE:  Grip 5/5    biceps    5/5   triceps  5/5      LUE:  Grip 5/5    biceps  5/5   triceps  5/5 RLE:  Thigh 5/5    knee   5/5    plantar flexion  5/5   dorsiflexion  5/5        LLE:  Thigh 5/5    knee   5/5    plantar flexion  5/5   dorsiflexion  5/5   Reflexes: deferred   Sensation: FACE: normal,  ARMS: normal bil,  LEGS: normal bil   Coordination/Complex Motor:  - Finger to Nose intact BL - Gait: deferred  ________________________________________________________________  ASSESSMENT/PLAN [name] is a 71 y.o. male with history of COPD, DM2, HTN, Hx of NSTEMI, CKD/ESRD, CAD presenting with sudden N/V and dizziness. S/p TNK. PT/OT currently recommending CIR.  Patient c/o ongoing nausea and needs to have BM and there has been ongoing lethargy on neurologic assessments.  Abdomen nontender with hypoactive bowel sounds.   Stroke: small right cerebellar peduncle infarct s/p TNK likely due to small vessel disease given risk factors CT Head 7/22: age indeterminate lacunar infarct posterior limb of L internal capsule, ASPECTS 9.  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  Moderate stenosis within the supraclinoid right ICA. Moderate stenosis  within an inferior division proximal M2 right MCA vessel. MRI Brain 7/23:  6 mm acute infarct at the junction of the posterior pons and middle cerebellar peduncle on the right. 3 mm later subacute infarct within the PLIC MRI brain 3/08 - stable right cerebellar peduncle infarct, previous small left PLIC infarct not visualized. 2D Echo EF > 75% LDL 104 A1C 5.2 UDS Negative VTE prophylaxis - heparin subcu aspirin 81 mg daily prior to admission, now on ASA and plavix DAPT for 3 weeks and then plavix alone.  PT/OT: CIR->now SNF Deposition - pending  Hiccups/nausea, acute onset, resolved 7/26 Patient c/o hiccups and nausea with dry heaves, ongoing nausea 7/28 without hiccups Repeat CT 7/26 no acute abnormality Repeat MRI 7/26 no new acute abnormalities KUB and CT abdomen negative Thorazine 25 mg IV x1 and 25 mg PO TID PRN Continue to monitor  Hypertension Home meds:  amlodipine 10mg /d, carvedilol 6.25mg  bid, furosamide 40mg /d, losartan 25mg /d On the high end Off of clevidipne IV since 7/27 Currently on amlodipine 5 bid, coreg 12.5 bid, hydralazine 25->50 tid, losartan 25 bid Long-term BP goal normotensive  Hyperlipidemia Home meds:  atorvastatin 20mg /d  LDL 104, goal < 70 Now on atorvastatin 40mg  Continue statin at discharge  Diabetes type II  Well Controlled per A1C result Home meds:  glargine insulin 10u p.m. HgbA1c 5.2, goal < 7.0 CBGs:  83 Sliding Scale Insulin Close PCP follow-up for DM control  Constipation  On senokote bid Fleet enema today Encourage po intake Gentle IVF  Other Stroke Risk Factors Advanced Age >/= 53  COPD/Emphysema: former Cigarette smoker Family hx stroke (father) Coronary artery disease s/p CABG  Other active problems ESRD on HD per Nephro following. HD done 7/24 inpatient, to be complete again today 7/28 S/p new left arm AVF with VVS 7/25 QT prolongation, repeat EKG, now QTc within range. Lethargy - EKG, troponin negative  Hospital  day # 7  Continue to monitor blood pressure as he did not receive medications yesterday.  We will continue to monitor constipation.  He refused  MiraLAX yesterday and today.  On Senokot and therevac mini enema (docusate sodium).  Pending SNF placement.  Appreciate nephrology assistance    To contact Stroke Continuity provider, please refer to [email or web address] After hours, contact General Neurology

## 2021-12-26 DIAGNOSIS — I639 Cerebral infarction, unspecified: Secondary | ICD-10-CM | POA: Diagnosis not present

## 2021-12-26 LAB — GLUCOSE, CAPILLARY
Glucose-Capillary: 71 mg/dL (ref 70–99)
Glucose-Capillary: 178 mg/dL — ABNORMAL HIGH (ref 70–99)
Glucose-Capillary: 140 mg/dL — ABNORMAL HIGH (ref 70–99)
Glucose-Capillary: 246 mg/dL — ABNORMAL HIGH (ref 70–99)

## 2021-12-26 NOTE — Progress Notes (Signed)
STROKE TEAM PROGRESS NOTE   INTERVAL HISTORY Patient is seen in his room, laying in bed. Niece and friend at bedside. I updated them as this was first time he had family visiting.  They are requesting rehab closer to home.    No BM yet. Eating about 50% of meals. No nausea/vomiting today.   Seen by PT yesterday, recommend CIR.  BP improving.  Vitals:   12/25/21 2315 12/26/21 0406 12/26/21 0828 12/26/21 1140  BP: (!) 120/59 131/64 136/74 (!) 142/67  Pulse: 69 68 73 68  Resp: 16 16 20 15   Temp: 98.5 F (36.9 C) 98.7 F (37.1 C) 98.4 F (36.9 C) 97.9 F (36.6 C)  TempSrc: Oral Oral Oral Oral  SpO2: 100% 100% 100% 100%  Weight:      Height:       CBC:  Recent Labs  Lab 12/23/21 0455 12/24/21 0358  WBC 9.0 9.2  HGB 12.4* 13.2  HCT 36.6* 39.4  MCV 94.1 94.9  PLT 205 536    Basic Metabolic Panel:  Recent Labs  Lab 12/20/21 0238 12/21/21 0418 12/23/21 0455 12/24/21 0358  NA 137   < > 133* 135  K 5.0   < > 4.9 4.9  CL 101   < > 95* 95*  CO2 20*   < > 22 23  GLUCOSE 112*   < > 110* 83  BUN 51*   < > 22 44*  CREATININE 10.24*   < > 5.73* 8.16*  CALCIUM 8.2*   < > 8.3* 8.4*  PHOS 7.5*  --   --   --    < > = values in this interval not displayed.    Lipid Panel:  Recent Labs  Lab 12/23/21 0455  TRIG 86    HgbA1c:  No results for input(s): "HGBA1C" in the last 168 hours.  Alcohol Level  No results for input(s): "ETH" in the last 168 hours.  ________________________________________________________________  IMAGING past 48hrs  No results found. ________________________________________________________________  PHYSICAL EXAM Laying upright in bed. Neurological Exam: Mental status/Cognition: awake and alert today. Oriented to person and place.  Speech/language: Fluent, comprehension intact, object naming intact, repetition intact. Speech clear, no dysarthria CN: 2-12: PERRL, EOMI. Face symmetric. Tongue midline.     Motor:  Muscle bulk and tone:  normal Normal strength in b/l UE with no dirft.  Tremor: none.   Reflexes: deferred   Sensation: Normal LT throughout UE/LE.   Coordination/Complex Motor:  - Finger to Nose intact BL - Gait: deferred  ________________________________________________________________  ASSESSMENT/PLAN [name] is a 71 y.o. male with history of COPD, DM2, HTN, Hx of NSTEMI, CKD/ESRD, CAD presenting with sudden N/V and dizziness. S/p TNK. PT/OT currently recommending CIR.  Patient c/o ongoing nausea and needs to have BM and there has been ongoing lethargy on neurologic assessments.  Abdomen nontender.  Stroke: small right cerebellar peduncle infarct s/p TNK likely due to small vessel disease given risk factors CT Head 7/22: age indeterminate lacunar infarct posterior limb of L internal capsule, ASPECTS 9.  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  Moderate stenosis within the supraclinoid right ICA. Moderate stenosis within an inferior division proximal M2 right MCA vessel. MRI Brain 7/23:  6 mm acute infarct at the junction of the posterior pons and middle cerebellar peduncle on the right. 3 mm later subacute infarct within the PLIC MRI brain 6/44 - stable right cerebellar peduncle infarct, previous small left  PLIC infarct not visualized. 2D Echo EF > 75% LDL 104 A1C 5.2 UDS Negative VTE prophylaxis - heparin subcu aspirin 81 mg daily prior to admission, now on ASA and plavix DAPT for 3 weeks and then plavix alone.  PT/OT: CIR->now SNF Deposition - pending  Hiccups/nausea, acute onset, resolved 7/26 Patient c/o hiccups and nausea with dry heaves, ongoing nausea 7/28 without hiccups Repeat CT 7/26 no acute abnormality Repeat MRI 7/26 no new acute abnormalities KUB and CT abdomen negative Thorazine 25 mg IV x1 and 25 mg PO TID PRN Continue to monitor  Hypertension Home meds:  amlodipine 10mg /d, carvedilol 6.25mg  bid,  furosamide 40mg /d, losartan 25mg /d On the high end Off of clevidipne IV since 7/27 Currently on amlodipine 5 bid, coreg 12.5 bid, hydralazine 25->50 tid, losartan 25 bid Long-term BP goal normotensive  Hyperlipidemia Home meds:  atorvastatin 20mg /d  LDL 104, goal < 70 Now on atorvastatin 40mg  Continue statin at discharge  Diabetes type II  Well Controlled per A1C result Home meds:  glargine insulin 10u p.m. HgbA1c 5.2, goal < 7.0 CBGs:  83 Sliding Scale Insulin Close PCP follow-up for DM control  Constipation  On senokote bid Fleet enema today Encourage po intake Gentle IVF  Other Stroke Risk Factors Advanced Age >/= 67  COPD/Emphysema: former Cigarette smoker Family hx stroke (father) Coronary artery disease s/p CABG  Other active problems ESRD on HD per Nephro following. HD done 7/24 inpatient, to be complete again today 7/28 S/p new left arm AVF with [facility] 7/25 QT prolongation, repeat EKG, now QTc within range. Lethargy - EKG, troponin negative  Hospital day # 8    To contact Stroke Continuity provider, please refer to [email or web address] After hours, contact General Neurology

## 2021-12-26 NOTE — Progress Notes (Signed)
[facility] Progress Note  Subjective: pt transferred out of ICU to floor yesterday.   Vitals:   12/25/21 2315 12/26/21 0406 12/26/21 0828 12/26/21 1140  BP: (!) 120/59 131/64 136/74 (!) 142/67  Pulse: 69 68 73 68  Resp: 16 16 20 15   Temp: 98.5 F (36.9 C) 98.7 F (37.1 C) 98.4 F (36.9 C) 97.9 F (36.6 C)  TempSrc: Oral Oral Oral Oral  SpO2: 100% 100% 100% 100%  Weight:      Height:        Exam: Gen: NAD, alert HEENT: anicteric sclera, MMM CV: normal rate, no murmurs, no edema Lungs: bilateral chest rise, normal wob Abd: soft, non-tender, non-distended Neuro: normal speech, no gross focal deficits  Dialysis access: RIJ TDC intact, new LUA AVG +bruit   Home meds: amlodipine 10, carvedilol 6.25 bid, furosemide 40 qd, hydralazine 25 tid, losartan 25 qd, aspirin, atrovastatin, insulin glargine, sl ntg, velphoro 500 ac tid   OP HD: [facility] MWF 4h  400/800   56.5kg  2/2.5 bath TDC  Hep 8000 - sensipar 30 tiw po - doxecalciferol 4 ug tiw IV - no esa/ Fe  Current BP meds: Norvasc 5 bid - 1/2 doses given yest Coreg 12.5 bid - 0/2 doses given yest Hydralazine 50 tid - 1/3 doses given yest Losartan 25 bid - 1/2 doses given yest    Assessment/ Plan: Acute CVA - sp TNK, 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 Per neurology Constipation - per abd CT, thera-vac mini-enema given yesterday. Getting senokot bid.  *** HTN - has chronic HTN w/ possible nonadherence at times. SP Cleviprex gtt. BP's well controlled now w/ 4 medications as above, all have hold orders for SBP < 120-125. Will change back norvasc/ losartan to once a day in the evening.  Vol - no vol excess on exam, 2kg under dry.  ESRD - usual HD MWF. Next HD Monday.  HD access - [facility]VS placed a new  LFA AVG here on 7/25 (pt has had only [facility] for access). Appreciate assistance.  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 MBD ckd - CCa in range, phos high. Continue sensipar and IV vdra w/ hd.  Cont velphoro as binder.  DM 2 -per  primary  Rob Ashana Tullo 12/26/2021, 1:28 PM   Recent Labs  Lab 12/20/21 0238 12/21/21 0418 12/23/21 0455 12/24/21 0358  HGB 13.5   < > 12.4* 13.2  CALCIUM 8.2*   < > 8.3* 8.4*  PHOS 7.5*  --   --   --   CREATININE 10.24*   < > 5.73* 8.16*  K 5.0   < > 4.9 4.9   < > = values in this interval not displayed.    No results for input(s): "IRON", "TIBC", "FERRITIN" in the last 168 hours. Inpatient medications:  amLODipine  5 mg Oral BID   aspirin EC  81 mg Oral Daily   atorvastatin  40 mg Oral Daily   carvedilol  12.5 mg Oral BID WC   Chlorhexidine Gluconate Cloth  6 each Topical Q0600   cinacalcet  30 mg Oral Q M,W,F   clopidogrel  75 mg Oral Daily   docusate sodium  1 enema Rectal Daily   doxercalciferol  4 mcg Intravenous Q M,W,F-HD   heparin injection (subcutaneous)  5,000 Units Subcutaneous Q8H   hydrALAZINE  50 mg Oral TID   insulin aspart  0-6 Units Subcutaneous TID AC & HS   losartan  25 mg Oral BID   pantoprazole  40 mg  Oral Daily   polyethylene glycol  17 g Oral BID   senna-docusate  1 tablet Oral BID   sucroferric oxyhydroxide  500 mg Oral TID WC    clevidipine Stopped (12/23/21 0020)   acetaminophen **OR** acetaminophen (TYLENOL) oral liquid 160 mg/5 mL **OR** acetaminophen, chlorproMAZINE, morphine injection, ondansetron (ZOFRAN) IV, mouth rinse, oxyCODONE-acetaminophen

## 2021-12-27 DIAGNOSIS — I639 Cerebral infarction, unspecified: Secondary | ICD-10-CM | POA: Diagnosis not present

## 2021-12-27 LAB — RENAL FUNCTION PANEL
Albumin: 2.7 g/dL — ABNORMAL LOW (ref 3.5–5.0)
Anion gap: 12 (ref 5–15)
BUN: 63 mg/dL — ABNORMAL HIGH (ref 8–23)
CO2: 26 mmol/L (ref 22–32)
Calcium: 7.4 mg/dL — ABNORMAL LOW (ref 8.9–10.3)
Chloride: 93 mmol/L — ABNORMAL LOW (ref 98–111)
Creatinine, Ser: 10.29 mg/dL — ABNORMAL HIGH (ref 0.61–1.24)
GFR, Estimated: 5 mL/min — ABNORMAL LOW (ref >60)
Glucose, Bld: 165 mg/dL — ABNORMAL HIGH (ref 70–99)
Phosphorus: 5.5 mg/dL — ABNORMAL HIGH (ref 2.5–4.6)
Potassium: 4.8 mmol/L (ref 3.5–5.1)
Sodium: 131 mmol/L — ABNORMAL LOW (ref 135–145)

## 2021-12-27 LAB — GLUCOSE, CAPILLARY
Glucose-Capillary: 142 mg/dL — ABNORMAL HIGH (ref 70–99)
Glucose-Capillary: 95 mg/dL (ref 70–99)
Glucose-Capillary: 175 mg/dL — ABNORMAL HIGH (ref 70–99)
Glucose-Capillary: 161 mg/dL — ABNORMAL HIGH (ref 70–99)
Glucose-Capillary: 114 mg/dL — ABNORMAL HIGH (ref 70–99)

## 2021-12-27 MED ORDER — BISACODYL 10 MG RE SUPP: 10.0000 mg | Freq: Every day | RECTAL | Status: DC | PRN

## 2021-12-27 MED ORDER — LOSARTAN POTASSIUM 25 MG PO TABS
25.0000 mg | ORAL_TABLET | Freq: Every day | ORAL | Status: DC
  Filled 2021-12-27: qty 1

## 2021-12-27 MED ORDER — HEPARIN SODIUM (PORCINE) 1000 UNIT/ML IJ SOLN
INTRAMUSCULAR | Status: AC
  Administered 2021-12-27: 3200 [IU] via INTRAVENOUS_CENTRAL
  Filled 2021-12-27: qty 4

## 2021-12-27 MED ORDER — HEPARIN SODIUM (PORCINE) 1000 UNIT/ML DIALYSIS: 2500.0000 [IU] | INTRAMUSCULAR | Status: DC | PRN

## 2021-12-27 MED ORDER — CHLORHEXIDINE GLUCONATE CLOTH 2 % EX PADS
6.0000 | MEDICATED_PAD | Freq: Every day | CUTANEOUS | Status: DC
Start: 2021-12-27 — End: 2021-12-29
  Administered 2021-12-28: 6 via TOPICAL

## 2021-12-27 MED ORDER — AMLODIPINE BESYLATE 5 MG PO TABS
5.0000 mg | ORAL_TABLET | Freq: Every day | ORAL | Status: DC
Start: 2021-12-27 — End: 2021-12-29
  Administered 2021-12-27: 5 mg via ORAL
  Filled 2021-12-27: qty 1

## 2021-12-27 NOTE — Progress Notes (Addendum)
Inpatient Rehab Admissions Coordinator:   Left message for niece at [phone] to discuss dispo.  Will continue to follow.   1120: Spoke to pt's niece, Roselyn Reef, over the phone to discuss CIR recommendations, and goals/expectations of CIR stay.  Given CLOF would likely only be on CIR for 7-10 days with goals of supervision, intermittent mod I.  She reports pt will not have any support at discharge as she and other family do not live locally and are not able to have pt come stay with them.  She and family have discussed and feel pt would benefit from prolonged rehab course at SNF level to reach more independent level prior to returning to previous living arrangement.  TOC aware.    Shann Medal, PT, DPT Admissions Coordinator [phone] 12/27/21  11:04 AM

## 2021-12-27 NOTE — Progress Notes (Signed)
[facility] NEPHROLOGY PROGRESS NOTE  Assessment/ Plan: OP HD: [facility] MWF 4h  400/800   56.5kg  2/2.5 bath TDC  Hep 8000 - sensipar 30 tiw po - doxecalciferol 4 ug tiw IV - no esa/ Fe  # Acute CVA - sp TNK, right cerebellar infarction likely due to small vessel disease, followed by neurology.    # ESRD: HD MWF. Next HD today.   #HD access - [facility]VS placed a new  LFA AVG here on 7/25 (pt has had only [facility] for access). Appreciate assistance.   # Anemia esrd - Hb above goal, no esa needs.  # CKD-MBD: On sensipar and IV vdra w/ hd.  Cont velphoro as binder. Check lab.   #HTN/Volume - has chronic HTN w/ possible nonadherence at times. SP Cleviprex gtt. BP soft therefore I will change losartan and amlodipine to once a day.  UF during HD.  Subjective: Seen and examined at bedside.  He denies nausea, vomiting, chest pain, shortness of breath.  No new event. Objective Vital signs in last 24 hours: Vitals:   12/26/21 1623 12/26/21 1948 12/26/21 2309 12/27/21 0302  BP: 136/64 (!) 114/59 (!) 99/54 114/61  Pulse: 65 60 64 60  Resp: 16 15 16 15   Temp: 97.8 F (36.6 C) 98 F (36.7 C) 98.9 F (37.2 C) 98.7 F (37.1 C)  TempSrc: Oral Oral Oral Oral  SpO2: 100% 100% 99% 98%  Weight:      Height:       Weight change:   Intake/Output Summary (Last 24 hours) at 12/27/2021 0944 Last data filed at 12/27/2021 2229 Gross per 24 hour  Intake 360 ml  Output 300 ml  Net 60 ml       Labs: RENAL PANEL Recent Labs    12/18/21 1811 12/18/21 1822 12/19/21 0559 12/20/21 0238 12/21/21 0418 12/21/21 0922 12/22/21 0626 12/23/21 0455 12/24/21 0358  NA 140 140  --  137 133* 131* 131* 133* 135  K 4.0 4.0  --  5.0 4.3 4.4 4.4 4.9 4.9  CL 102 103  --  101 93* 95* 95* 95* 95*  CO2 22  --   --  20* 27  --  16* 22 23  GLUCOSE 133* 136*  --  112* 127* 114* 182* 110* 83  BUN 25* 27*  --  51* 31* 35* 46* 22 44*  CREATININE 7.57* 8.20*  --  10.24* 7.37* 8.50* 9.81* 5.73* 8.16*  CALCIUM  8.7*  --   --  8.2* 7.9*  --  8.3* 8.3* 8.4*  MG  --   --  2.2  --   --   --   --   --   --   PHOS  --   --   --  7.5*  --   --   --   --   --   ALBUMIN 4.1  --   --   --   --   --   --   --   --      Liver Function Tests: No results for input(s): "AST", "ALT", "ALKPHOS", "BILITOT", "PROT", "ALBUMIN" in the last 168 hours. No results for input(s): "LIPASE", "AMYLASE" in the last 168 hours. No results for input(s): "AMMONIA" in the last 168 hours. CBC: Recent Labs    12/21/21 0922 12/22/21 0626 12/22/21 0811 12/23/21 0455 12/24/21 0358  HGB 14.3 13.9 13.9 12.4* 13.2  MCV  --  91.7 94.8 94.1 94.9    Cardiac Enzymes: No results  for input(s): "CKTOTAL", "CKMB", "CKMBINDEX", "TROPONINI" in the last 168 hours. CBG: Recent Labs  Lab 12/26/21 0621 12/26/21 1201 12/26/21 1626 12/26/21 2110 12/27/21 0606  GLUCAP 71 178* 140* 246* 95    Iron Studies: No results for input(s): "IRON", "TIBC", "TRANSFERRIN", "FERRITIN" in the last 72 hours. Studies/Results: No results found.  Medications: Infusions:  clevidipine Stopped (12/23/21 0020)    Scheduled Medications:  amLODipine  5 mg Oral BID   aspirin EC  81 mg Oral Daily   atorvastatin  40 mg Oral Daily   carvedilol  12.5 mg Oral BID WC   Chlorhexidine Gluconate Cloth  6 each Topical Q0600   Chlorhexidine Gluconate Cloth  6 each Topical Q0600   cinacalcet  30 mg Oral Q M,W,F   clopidogrel  75 mg Oral Daily   docusate sodium  1 enema Rectal Daily   doxercalciferol  4 mcg Intravenous Q M,W,F-HD   heparin injection (subcutaneous)  5,000 Units Subcutaneous Q8H   hydrALAZINE  50 mg Oral TID   insulin aspart  0-6 Units Subcutaneous TID AC & HS   losartan  25 mg Oral BID   pantoprazole  40 mg Oral Daily   polyethylene glycol  17 g Oral BID   senna-docusate  1 tablet Oral BID   sucroferric oxyhydroxide  500 mg Oral TID WC    have reviewed scheduled and prn medications.  Physical Exam: General:NAD, comfortable Heart:RRR, s1s2  nl Lungs:clear b/l, no crackle Abdomen:soft, Non-tender, non-distended Extremities:No edema Dialysis Access: Right IJ TDC intact, new LUA AVG +bruit  Shamari Lofquist Prasad Vianey Caniglia 12/27/2021,9:44 AM  LOS: 9 days

## 2021-12-27 NOTE — TOC Initial Note (Signed)
Transition of Care [facility]) - Initial/Assessment Note    Patient Details  Name: Shawn Collins MRN: [id number] Date of Birth: 1951/04/16  Transition of Care [facility]) CM/SW Contact:    Pollie Friar, RN Phone Number: 12/27/2021, 11:32 AM  Clinical Narrative:                 PCP: Dr Jeanie Cooks Pt is ESRD, admitted with stroke. He is from home alone. He states he has a sister local but she lives in "a home". He states his next closest relative is his niece in [place]. CM updated CIR to see about support after a CIR stay. Per CIR he will not have the support he needs for a CIR stay. CM has updated CSW to begin SNF rehab process. Pt is agreeable to being faxed out in [facility] area.  TOC following.  Expected Discharge Plan: Skilled Nursing Facility Barriers to Discharge: Continued Medical Work up   Patient Goals and CMS Choice   CMS Medicare.gov Compare Post Acute Care list provided to:: Patient Choice offered to / list presented to : Patient  Expected Discharge Plan and Services Expected Discharge Plan: [place] In-house Referral: Clinical Social Work   Post Acute Care Choice: [place] Living arrangements for the past 2 months: [place]                                      Prior Living Arrangements/Services Living arrangements for the past 2 months: Single Family Home Lives with:: Self Patient language and need for interpreter reviewed:: Yes Do you feel safe going back to the place where you live?: Yes      Need for Family Participation in Patient Care: Yes (Comment) Care giver support system in place?: No (comment) Current home services: DME (walker but doesnt currently use it) Criminal Activity/Legal Involvement Pertinent to Current Situation/Hospitalization: No - Comment as needed  Activities of Daily Living Home Assistive Devices/Equipment: None ADL Screening (condition at time of admission) Patient's cognitive ability  adequate to safely complete daily activities?: Yes Is the patient deaf or have difficulty hearing?: No Does the patient have difficulty seeing, even when wearing glasses/contacts?: No Does the patient have difficulty concentrating, remembering, or making decisions?: No Patient able to express need for assistance with ADLs?: Yes Does the patient have difficulty dressing or bathing?: No Independently performs ADLs?: Yes (appropriate for developmental age) Does the patient have difficulty walking or climbing stairs?: Yes Weakness of Legs: Both Weakness of Arms/Hands: None  Permission Sought/Granted                  Emotional Assessment Appearance:: Appears stated age Attitude/Demeanor/Rapport: Engaged Affect (typically observed): Accepting Orientation: : Oriented to Self, Oriented to Place, Oriented to  Time, Oriented to Situation   Psych Involvement: No (comment)  Admission diagnosis:  Hypertensive urgency [I16.0] Posterior circulation stroke ([place]) [I63.50] Stroke determined by clinical assessment [facility]) [I63.9] Patient Active Problem List   Diagnosis Date Noted   Stroke determined by clinical assessment ([place]) 12/18/2021   ESRD (end stage renal disease) on dialysis ([place]) 02/23/2021   DOE (dyspnea on exertion) 11/22/2017   Hyperlipidemia LDL goal <70 04/17/2017   CAD (coronary artery disease), native coronary artery    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 09/10/2014   Diabetes mellitus without complication ([place]) 62/26/3335   Hypertension 09/10/2014   Emphysema lung ([place])  09/10/2014   PCP:  Nolene Ebbs, MD Pharmacy:   [facility], [place] [address] Phone: [phone] Fax: [phone]     Social Determinants of Health (SDOH) Interventions    Readmission Risk Interventions     No data to display

## 2021-12-27 NOTE — Care Management Important Message (Signed)
Important Message  Patient Details  Name: Shawn Collins MRN: [id number] Date of Birth: September 22, 1950   Medicare Important Message Given:  Yes     [name] 12/27/2021, 2:55 PM

## 2021-12-27 NOTE — Progress Notes (Signed)
Received patient in bed to unit. On going treatment and about to be done.  Alert and oriented.  Informed consent signed and in  chart.    Treatment completed: 2039    Patient tolerated well.  Transported back to the room  alert, without acute distress.  Hand-off given to patient's nurse.   Access used: HD catheter Access issues:  none   Total UF removed: 1L Medication(s) given: none Post HD VS: BP 138/40mmHg, HR 86bpm, RR 11Brpm, Sats 99% on RA  Post HD weight: 57.5kg   [facility]

## 2021-12-27 NOTE — NC FL2 (Signed)
[place] MEDICAID FL2 LEVEL OF CARE SCREENING TOOL     IDENTIFICATION  Patient Name: Shawn Collins Birthdate: 15-Mar-1951 Sex: male Admission Date (Current Location): 12/18/2021  [facility] and [place] Number:  Herbalist and Address:  [facility] [place], [place] [address] [place], [place] [address]      Provider Number: [id number]  Attending Physician Name and Address:  Stroke, Md, MD  Relative Name and Phone Number:       Current Level of Care: Hospital Recommended Level of Care: [place] Prior Approval Number:    Date Approved/Denied:   PASRR Number: [id number]  Discharge Plan: SNF    Current Diagnoses: Patient Active Problem List   Diagnosis Date Noted   Stroke determined by clinical assessment ([place]) 12/18/2021   ESRD (end stage renal disease) on dialysis ([place]) 02/23/2021   DOE (dyspnea on exertion) 11/22/2017   Hyperlipidemia LDL goal <70 04/17/2017   CAD (coronary artery disease), native coronary artery    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 09/10/2014   Diabetes mellitus without complication ([place]) 47/65/4650   Hypertension 09/10/2014   Emphysema lung ([place]) 09/10/2014    Orientation RESPIRATION BLADDER Height & Weight     Self, Time, Situation, Place  Normal Continent Weight: 119 lb 14.9 oz (54.4 kg) Height:  5\' 8"  (172.7 cm)  BEHAVIORAL SYMPTOMS/MOOD NEUROLOGICAL BOWEL NUTRITION STATUS      Continent Diet (heart healthy/carb modified)  AMBULATORY STATUS COMMUNICATION OF NEEDS Skin   Limited Assist Verbally  Surgical wounds (left arm, liquid skin)                       Personal Care Assistance Level of Assistance  Bathing, Feeding, Dressing Bathing Assistance: Limited assistance Feeding assistance: Independent Dressing Assistance: Limited assistance     Functional Limitations Info             SPECIAL CARE FACTORS FREQUENCY  PT (By licensed PT), OT (By licensed OT)     PT Frequency: 5x/wk OT Frequency: 5x/wk            Contractures Contractures Info: Not present    Additional Factors Info  Code Status, Allergies, Insulin Sliding Scale Code Status Info: Full Allergies Info: NKA   Insulin Sliding Scale Info: see DC summary       Current Medications (12/27/2021):  This is the current hospital active medication list Current Facility-Administered Medications  Medication Dose Route Frequency Provider Last Rate Last Admin   acetaminophen (TYLENOL) tablet 650 mg  650 mg Oral Q4H PRN Schuh, McKenzi P, PA-C   650 mg at 12/22/21 0122   Or   acetaminophen (TYLENOL) 160 MG/5ML solution 650 mg  650 mg Per Tube Q4H PRN Schuh, McKenzi P, PA-C       Or   acetaminophen (TYLENOL) suppository 650 mg  650 mg Rectal Q4H PRN Schuh, McKenzi P, PA-C       amLODipine (NORVASC) tablet 5 mg  5 mg Oral QHS Rosita Fire, MD       aspirin EC tablet 81 mg  81  mg Oral Daily Donnamae Jude, RPH   81 mg at 12/27/21 0846   atorvastatin (LIPITOR) tablet 40 mg  40 mg Oral Daily Schuh, McKenzi P, PA-C   40 mg at 12/27/21 0847   carvedilol (COREG) tablet 12.5 mg  12.5 mg Oral BID WC Roney Jaffe, MD   12.5 mg at 12/27/21 0847   Chlorhexidine Gluconate Cloth 2 % PADS 6 each  6 each Topical Q0600 Gerri Lins, PA-C   6 each at 12/27/21 1771   Chlorhexidine Gluconate Cloth 2 % PADS 6 each  6 each Topical Q0600 Roney Jaffe, MD       chlorproMAZINE (THORAZINE) tablet 25 mg  25 mg Oral TID PRN de Yolanda Manges, Cortney E, NP       cinacalcet (SENSIPAR) tablet 30 mg  30 mg Oral Q M,W,F Schuh,  McKenzi P, PA-C   30 mg at 12/27/21 0847   clopidogrel (PLAVIX) tablet 75 mg  75 mg Oral Daily Schuh, McKenzi P, PA-C   75 mg at 12/27/21 0846   doxercalciferol (HECTOROL) injection 4 mcg  4 mcg Intravenous Q M,W,F-HD Schuh, McKenzi P, PA-C   4 mcg at 12/24/21 1753   heparin injection 5,000 Units  5,000 Units Subcutaneous Q8H de Yolanda Manges, Cortney E, NP   5,000 Units at 12/27/21 1657   hydrALAZINE (APRESOLINE) tablet 50 mg  50 mg Oral TID Roney Jaffe, MD   50 mg at 12/27/21 0847   insulin aspart (novoLOG) injection 0-6 Units  0-6 Units Subcutaneous TID AC & HS Schuh, McKenzi P, PA-C   2 Units at 12/26/21 2128   [START ON 12/28/2021] losartan (COZAAR) tablet 25 mg  25 mg Oral Daily Rosita Fire, MD       morphine (PF) 2 MG/ML injection 2 mg  2 mg Intravenous Q2H PRN Schuh, McKenzi P, PA-C       ondansetron (ZOFRAN) injection 4 mg  4 mg Intravenous Q6H PRN Schuh, McKenzi P, PA-C   4 mg at 12/22/21 0843   Oral care mouth rinse  15 mL Mouth Rinse PRN Kerney Elbe, MD       oxyCODONE-acetaminophen (PERCOCET/ROXICET) 5-325 MG per tablet 1 tablet  1 tablet Oral Q4H PRN Schuh, McKenzi P, PA-C   1 tablet at 12/26/21 2335   pantoprazole (PROTONIX) EC tablet 40 mg  40 mg Oral Daily Schuh, McKenzi P, PA-C   40 mg at 12/27/21 0846   polyethylene glycol (MIRALAX / GLYCOLAX) packet 17 g  17 g Oral BID Jacky Kindle, MD   17 g at 12/26/21 1058   senna-docusate (Senokot-S) tablet 1 tablet  1 tablet Oral BID Rosalin Hawking, MD   1 tablet at 12/27/21 0846   sucroferric oxyhydroxide (VELPHORO) chewable tablet 500 mg  500 mg Oral TID WC Roney Jaffe, MD   500 mg at 12/27/21 9038     Discharge Medications: Please see discharge summary for a list of discharge medications.  Relevant Imaging Results:  Relevant Lab Results:   Additional Information SS#: [id number]; HD MWF at [facility], 11:55 chair time  Geralynn Ochs, [place]

## 2021-12-27 NOTE — Progress Notes (Signed)
Patient left unit for HD at this time. CCMD notified.

## 2021-12-27 NOTE — TOC Progression Note (Signed)
Transition of Care [facility]) - Progression Note    Patient Details  Name: Shawn Collins MRN: [id number] Date of Birth: 11/11/1950  Transition of Care [facility]) CM/SW [place], LCSW Phone Number: 12/27/2021, 4:05 PM  Clinical Narrative:    CSW went by room to provide SNF bed offers but patient sleeping soundly. CSW left offers in room and called patient's niece, Shawn Collins. CSW presented bed offers to her and she stated she will go over them with patient to decide.    Expected Discharge Plan: [place] Barriers to Discharge: Continued Medical Work up  Expected Discharge Plan and Services Expected Discharge Plan: [place] In-house Referral: Clinical Social Work   Post Acute Care Choice: [place] Living arrangements for the past 2 months: Single Family Home                                       Social Determinants of Health (SDOH) Interventions    Readmission Risk Interventions     No data to display

## 2021-12-27 NOTE — Progress Notes (Addendum)
STROKE TEAM PROGRESS NOTE   INTERVAL HISTORY Patient is seen in his room with a friend at the bedside.  He reports that he lives in [place] and is amenable to going to rehab here.  He denies nausea and states that his hiccups have improved.  He states he still felt dizzy and nauseous when he stood up to brush his teeth.  Vital signs are stable.  Neurological exam is unchanged  Vitals:   12/26/21 1950 12/26/21 2309 12/27/21 0302 12/27/21 1123  BP: (!) 114/59 (!) 99/54 114/61 117/62  Pulse: 60 64 60 64  Resp: 15 16 15 18   Temp: 98 F (36.7 C) 98.9 F (37.2 C) 98.7 F (37.1 C) 98.3 F (36.8 C)  TempSrc: Oral Oral Oral   SpO2: 100% 99% 98% 100%  Weight:      Height:       CBC:  Recent Labs  Lab 12/23/21 0455 12/24/21 0358  WBC 9.0 9.2  HGB 12.4* 13.2  HCT 36.6* 39.4  MCV 94.1 94.9  PLT 205 408    Basic Metabolic Panel:  Recent Labs  Lab 12/23/21 0455 12/24/21 0358  NA 133* 135  K 4.9 4.9  CL 95* 95*  CO2 22 23  GLUCOSE 110* 83  BUN 22 44*  CREATININE 5.73* 8.16*  CALCIUM 8.3* 8.4*    Lipid Panel:  Recent Labs  Lab 12/23/21 0455  TRIG 86    HgbA1c:  No results for input(s): "HGBA1C" in the last 168 hours.  Alcohol Level  No results for input(s): "ETH" in the last 168 hours.  ________________________________________________________________  IMAGING past 48hrs  No results found. ________________________________________________________________  PHYSICAL EXAM Laying upright in bed. Neurological Exam: Mental status/Cognition: awake and alert today. Oriented to person and place.  Speech/language: Fluent, comprehension intact. Speech clear, no dysarthria CN: 2-12: PERRL, EOMI. Face symmetric. Tongue midline.     Motor:  Muscle bulk and tone: normal Normal strength in b/l UE with no dirft.  Tremor: none.   Reflexes: deferred   Sensation: Normal LT throughout UE/LE.   Coordination/Complex Motor:  - Finger to Nose intact BL - Gait: deferred   ________________________________________________________________  ASSESSMENT/PLAN [name] is a 71 y.o. male with history of COPD, DM2, HTN, Hx of NSTEMI, CKD/ESRD, CAD presenting with sudden N/V and dizziness. S/p TNK. PT/OT currently recommending CIR.  Patient c/o ongoing nausea and needs to have BM and there has been ongoing lethargy on neurologic assessments.  Abdomen nontender.  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  Moderate stenosis within the supraclinoid right ICA. Moderate stenosis within an inferior division proximal M2 right MCA vessel. MRI Brain 7/23:  6 mm acute infarct at the junction of the posterior pons and middle cerebellar peduncle on the right. 3 mm later subacute infarct within the PLIC MRI brain 1/44 - stable right cerebellar peduncle infarct, previous small left PLIC infarct not visualized. 2D Echo EF > 75% LDL 104 A1C 5.2 UDS Negative VTE prophylaxis - heparin subcu aspirin 81 mg daily prior to admission, now on ASA and plavix DAPT for 3 weeks and then plavix alone.  PT/OT: CIR->now SNF Deposition - pending  Hiccups/nausea, acute onset, resolved 7/26 Patient c/o hiccups and nausea with dry heaves, ongoing nausea 7/28 without hiccups Repeat  CT 7/26 no acute abnormality Repeat MRI 7/26 no new acute abnormalities KUB and CT abdomen negative Thorazine 25 mg IV x1 and 25 mg PO TID PRN Continue to monitor  Hypertension Home meds:  amlodipine 10mg /d, carvedilol 6.25mg  bid, furosamide 40mg /d, losartan 25mg /d On the high end Off of clevidipne IV since 7/27 Currently on amlodipine 5 bid, coreg 12.5 bid, hydralazine 25->50 tid, losartan 25 bid Long-term BP goal normotensive  Hyperlipidemia Home meds:   atorvastatin 20mg /d  LDL 104, goal < 70 Now on atorvastatin 40mg  Continue statin at discharge  Diabetes type II  Well Controlled per A1C result Home meds:  glargine insulin 10u p.m. HgbA1c 5.2, goal < 7.0 CBGs:  83 Sliding Scale Insulin Close PCP follow-up for DM control  Constipation  On senokot bid Fleet enema PRN dulcolax suppository Encourage po intake Gentle IVF  Other Stroke Risk Factors Advanced Age >/= 51  COPD/Emphysema: former Cigarette smoker Family hx stroke (father) Coronary artery disease s/p CABG  Other active problems ESRD on HD per Nephro following. HD done 7/24 inpatient, to be complete again today 7/28 S/p new left arm AVF with VVS 7/25 QT prolongation, repeat EKG, now QTc within range. Lethargy - EKG, troponin negative  Hospital day # Shawn Collins , MSN, AGACNP-BC [facility] See Amion for schedule and pager information 12/27/2021 1:20 PM   I have personally obtained history,examined this patient, reviewed notes, independently viewed imaging studies, participated in medical decision making and plan of care.ROS completed by me personally and pertinent positives fully documented  I have made any additions or clarifications directly to the above note. Agree with note above.  Patient remains ataxic due to cerebellar stroke from small vessel disease.  Continue aspirin Plavix for 3 weeks and then Plavix alone.  Continue ongoing therapies and transfer to rehab when bed available.  Long discussion with patient and friend at the bedside and answered questions.  Greater than 50% time during this 35-minute visit was spent of counseling and coordination of care and discussion patient and care team and answering questions  Antony Contras, MD Medical Director [place] Pager: [phone] 12/27/2021 1:45 PM    To contact Stroke Continuity provider, please refer to [email or web address] After hours, contact General Neurology

## 2021-12-27 NOTE — Progress Notes (Signed)
Physical Therapy Treatment Patient Details Name: Shawn Collins MRN: [id number] DOB: 05/02/1951 Today's Date: 12/27/2021   History of Present Illness Pt is a 71 y.o. male who presented 12/18/21 with sudden N/V and dizziness. Pt administered TNK. 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  Had L UE AV fistula creation for HD on 12/21/21.  PMH: COPD, DM2, HTN, Hx of NSTEMI, CKD/ESRD, CAD    PT Comments    Pt much improved both cognitively and functionally however is not at baseline. Pt was indep and driving PTA but now requires minA for transfers and use of RW for ambulation. Pt with decreased strength, impaired balance, increased falls risk, and mild ataxia. Pt to benefit from rehab s/p d/c to achieve safe mod I level of function for safe transition home.    Recommendations for follow up therapy are one component of a multi-disciplinary discharge planning process, led by the attending physician.  Recommendations may be updated based on patient status, additional functional criteria and insurance authorization.  Follow Up Recommendations  Acute inpatient rehab (3hours/day)     Assistance Recommended at Discharge Frequent or constant Supervision/Assistance  Patient can return home with the following Assistance with cooking/housework;Direct supervision/assist for medications management;Direct supervision/assist for financial management;Assist for transportation;Help with stairs or ramp for entrance;A little help with walking and/or transfers;A little help with bathing/dressing/bathroom   Equipment Recommendations  Rolling walker (2 wheels);BSC/3in1    Recommendations for Other Services Rehab consult     Precautions /  Restrictions Precautions Precautions: Fall Precaution Comments: New AV fistula L UE- excessive swelling Restrictions Weight Bearing Restrictions: No     Mobility  Bed Mobility               General bed mobility comments: pt sitting up EOB eating breakfast upon PT arrival    Transfers Overall transfer level: Needs assistance Equipment used: Rolling walker (2 wheels) Transfers: Sit to/from Stand Sit to Stand: Min assist           General transfer comment: verbal cues for hand placement and to push up from bed not pull up on walker, minA to stedy during transition of hands from bed to RW    Ambulation/Gait Ambulation/Gait assistance: Min assist Gait Distance (Feet): 150 Feet Assistive device: Rolling walker (2 wheels) Gait Pattern/deviations: Step-through pattern, Decreased stride length, Narrow base of support, Trunk flexed Gait velocity: dec Gait velocity interpretation: <1.31 ft/sec, indicative of household ambulator   General Gait Details: minA for walker management, verbal cues to stay in walker and not push to far out in front of self, pt with very narrow base of support, near cross over gait requiring verbal cues to increase base of support, with onset of fatigue pt with noted bilat knee instability, mild buckling of knees   Stairs             Wheelchair Mobility    Modified Rankin (Stroke Patients Only) Modified Rankin (Stroke Patients Only) Pre-Morbid Rankin Score: No symptoms Modified Rankin: Moderately severe disability     Balance Overall balance assessment: Needs assistance Sitting-balance support: No upper extremity supported, Feet supported Sitting balance-Leahy Scale: Good     Standing balance support: Bilateral upper extremity supported, During functional activity, Reliant on assistive device for balance Standing balance-Leahy  Scale: Poor Standing balance comment: Reliant on UE support and up to minA                             Cognition Arousal/Alertness: Awake/alert Behavior During Therapy: WFL for tasks assessed/performed Overall Cognitive Status: No family/caregiver present to determine baseline cognitive functioning                                 General Comments: pt alert and oriented, able to follow commands, has decreased proprioception of bilat LE foot placement, aware of deficits        Exercises      General Comments General comments (skin integrity, edema, etc.): SpO2 >95% on RA, VSS, excessive swelling in L UE, MD aware      Pertinent Vitals/Pain Pain Assessment Pain Assessment: No/denies pain    Home Living                          Prior Function            PT Goals (current goals can now be found in the care plan section) Progress towards PT goals: Progressing toward goals    Frequency    Min 4X/week      PT Plan Current plan remains appropriate    Co-evaluation              AM-PAC PT "6 Clicks" Mobility   Outcome Measure  Help needed turning from your back to your side while in a flat bed without using bedrails?: A Little Help needed moving from lying on your back to sitting on the side of a flat bed without using bedrails?: A Little Help needed moving to and from a bed to a chair (including a wheelchair)?: A Little Help needed standing up from a chair using your arms (e.g., wheelchair or bedside chair)?: A Little Help needed to walk in hospital room?: A Lot Help needed climbing 3-5 steps with a railing? : Total 6 Click Score: 15    End of Session Equipment Utilized During Treatment: Gait belt Activity Tolerance: Patient tolerated treatment well Patient left: in chair;with call bell/phone within reach Nurse Communication: Mobility status PT Visit Diagnosis: Other abnormalities of gait and mobility (R26.89);Dizziness and giddiness (R42);Difficulty in walking, not elsewhere classified (R26.2);Muscle weakness (generalized)  (M62.81);Other symptoms and signs involving the nervous system (R29.898);Unsteadiness on feet (R26.81)     Time: 7782-4235 PT Time Calculation (min) (ACUTE ONLY): 18 min  Charges:  $Gait Training: 8-22 mins                     Kittie Plater, PT, DPT Acute Rehabilitation Services Secure chat preferred Office #: [phone]    [place]line Lopes 12/27/2021, 10:23 AM

## 2021-12-28 DIAGNOSIS — I639 Cerebral infarction, unspecified: Secondary | ICD-10-CM | POA: Diagnosis not present

## 2021-12-28 LAB — GLUCOSE, CAPILLARY
Glucose-Capillary: 145 mg/dL — ABNORMAL HIGH (ref 70–99)
Glucose-Capillary: 145 mg/dL — ABNORMAL HIGH (ref 70–99)
Glucose-Capillary: 146 mg/dL — ABNORMAL HIGH (ref 70–99)

## 2021-12-28 MED ORDER — ASPIRIN 81 MG PO TBEC: 81.0000 mg | DELAYED_RELEASE_TABLET | Freq: Every day | ORAL | 12 refills | Status: DC

## 2021-12-28 MED ORDER — AMLODIPINE BESYLATE 5 MG PO TABS: 5.0000 mg | ORAL_TABLET | Freq: Every day | ORAL | 1 refills | Status: DC

## 2021-12-28 MED ORDER — CARVEDILOL 12.5 MG PO TABS: 12.5000 mg | ORAL_TABLET | Freq: Two times a day (BID) | ORAL | 1 refills | Status: DC

## 2021-12-28 MED ORDER — CLOPIDOGREL BISULFATE 75 MG PO TABS: 75.0000 mg | ORAL_TABLET | Freq: Every day | ORAL | 1 refills | Status: DC

## 2021-12-28 MED ORDER — DOXERCALCIFEROL 4 MCG/2ML IV SOLN: 4.0000 ug | INTRAVENOUS | 0 refills | Status: DC

## 2021-12-28 MED ORDER — CHLORHEXIDINE GLUCONATE CLOTH 2 % EX PADS: 6.0000 | MEDICATED_PAD | Freq: Every day | CUTANEOUS | Status: DC

## 2021-12-28 MED ORDER — HYDRALAZINE HCL 50 MG PO TABS: 50.0000 mg | ORAL_TABLET | Freq: Three times a day (TID) | ORAL | 1 refills | Status: DC

## 2021-12-28 MED ORDER — ATORVASTATIN CALCIUM 40 MG PO TABS: 40.0000 mg | ORAL_TABLET | Freq: Every day | ORAL | 1 refills | Status: DC

## 2021-12-28 NOTE — TOC Transition Note (Signed)
Transition of Care [facility]) - CM/SW Discharge Note   Patient Details  Name: Jobanny Mavis MRN: [id number] Date of Birth: [birth year]  Transition of Care [facility]) CM/SW Contact:  Geralynn Ochs, LCSW Phone Number: 12/28/2021, 2:56 PM   Clinical Narrative:   CSW met with patient to discuss bed offers, and patient chose [facility]. CSW confirmed bed availability at [facility], and medical stability with MD. CSW sent discharge information to [facility], and confirmed transportation to HD. CSW updated Renal Navigator with SNF choice. Transport scheduled with [facility] for next available.  Nurse to call report to [phone], Room 1008P.    Final next level of care: Skilled Nursing Facility Barriers to Discharge: Barriers Resolved   Patient Goals and CMS Choice Patient states their goals for this hospitalization and ongoing recovery are:: to get better CMS Medicare.gov Compare Post Acute Care list provided to:: Patient Choice offered to / list presented to : Patient  Discharge Placement              Patient chooses bed at: [facility] Patient to be transferred to facility by: [facility] Name of family member notified: Self Patient and family notified of of transfer: 12/28/21  Discharge Plan and Services In-house Referral: Clinical Social Work   Post Acute Care Choice: [facility]                               Social Determinants of Health ([place]) Interventions     Readmission Risk Interventions     No data to display

## 2021-12-28 NOTE — Progress Notes (Signed)
Occupational Therapy Treatment Patient Details Name: Shawn Collins MRN: [id number] DOB: 1951/02/16 Today's Date: 12/28/2021   History of present illness Pt is a 71 y.o. male who presented 12/18/21 with sudden N/V and dizziness. Pt administered TNK. 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  Had L UE AV fistula creation for HD on 12/21/21.  PMH: COPD, DM2, HTN, Hx of NSTEMI, CKD/ESRD, CAD   OT comments  Pt progressing towards established OT goals and eager to get OOB this date. Pt with nose bleed on arrival and RN notified. Pt performing ADL at min guard A level with intermittent min A. Pt performing two consecutive grooming tasks and requiring min indirect verbal cues for multi-step commands. Pt continues to present with decreased safety awareness, balance, problem solving, ability to follow commands, and STM. Per RN, family requesting discharge to SNF. Discharge recommendations updated accordingly. Will continue to follow acutely to optimize safety and independence in ADL and IADL.    Recommendations for follow up therapy are one component of a multi-disciplinary discharge planning process, led by the attending physician.  Recommendations may be updated based on patient status, additional functional criteria and insurance authorization.    Follow Up Recommendations  Skilled nursing-short term rehab (<3 hours/day)    Assistance Recommended at Discharge Frequent or constant Supervision/Assistance  Patient can return home with the following  A little help with walking and/or transfers;A little help with bathing/dressing/bathroom;Assistance with cooking/housework;Direct supervision/assist for medications management;Direct  supervision/assist for financial management;Assist for transportation;Help with stairs or ramp for entrance   Equipment Recommendations  Other (comment) (Defer to next venue)    Recommendations for Other Services      Precautions / Restrictions Precautions Precautions: Fall Precaution Comments: New AV fistula L UE- excessive swelling Restrictions Weight Bearing Restrictions: No       Mobility Bed Mobility Overal bed mobility: Needs Assistance Bed Mobility: Supine to Sit     Supine to sit: HOB elevated, Supervision     General bed mobility comments: Supervision for safety    Transfers Overall transfer level: Needs assistance Equipment used: Rolling walker (2 wheels) Transfers: Sit to/from Stand Sit to Stand: Min guard           General transfer comment: verbal cues for hand placement and to push up from bed not pull up on walker, min guard A for safety     Balance Overall balance assessment: Needs assistance Sitting-balance support: No upper extremity supported, Feet supported, Feet unsupported Sitting balance-Leahy Scale: Good Sitting balance - Comments: Pulling up socks with min guard A   Standing balance support: Bilateral upper extremity supported, During functional activity, Reliant on assistive device for balance Standing balance-Leahy Scale: Poor Standing balance comment: Reliant on RW for dynamic balance. Able to perform static standing grooming tasks with min guard                           ADL either performed or assessed with clinical judgement   ADL Overall ADL's : Needs assistance/impaired Eating/Feeding: Sitting;Set up Eating/Feeding Details (indicate cue type and reason): Finishing bvreakfast on arrival Grooming: Wash/dry hands;Oral care;Wash/dry face;Min guard;Standing Grooming Details (indicate cue type and reason): Pt performing two consecutive grooming tasks. Min verbal cues for  memory.             Lower Body Dressing: Min  guard Lower Body Dressing Details (indicate cue type and reason): Pulling up socks with min guard A sitting EOB. With decreased core strength, however, no overt LOB. Toilet Transfer: Min guard;Ambulation;Comfort height toilet;Rolling walker (2 wheels) Toilet Transfer Details (indicate cue type and reason): Simulated bed to recliner. Pt requiring increased time and min verbal cues for safety during transfer.         Functional mobility during ADLs: Min guard;Rolling walker (2 wheels) General ADL Comments: min G for all OOB functional activity, noted to be a bit unsteady. No overt LOB.    Extremity/Trunk Assessment Upper Extremity Assessment Upper Extremity Assessment: Generalized weakness   Lower Extremity Assessment Lower Extremity Assessment: Defer to PT evaluation        Vision   Vision Assessment?: No apparent visual deficits Additional Comments: Pt scanning during session to locte grooming items and items on tray for self feeding, and to write.   Perception Perception Perception: Not tested   Praxis Praxis Praxis: Not tested    Cognition Arousal/Alertness: Awake/alert Behavior During Therapy: WFL for tasks assessed/performed Overall Cognitive Status: No family/caregiver present to determine baseline cognitive functioning Area of Impairment: Memory, Safety/judgement, Awareness, Problem solving, Following commands                     Memory: Decreased short-term memory Following Commands: Follows one step commands consistently, Follows one step commands with increased time, Follows multi-step commands inconsistently, Follows multi-step commands with increased time Safety/Judgement: Decreased awareness of deficits, Decreased awareness of safety Awareness: Emergent Problem Solving: Slow processing, Difficulty sequencing, Requires verbal cues General Comments: Pt alert and oriented this session. Following one step commands with increased time and 2 step commands with  min indirect verbal cues. Pt requiring min verbal cues throughout session for safety awareness. Challenging cognition with naming task, and pt able to name 11 farm animals in <1 minute, and able to name 11 words that begin with F inn<1 minute.        Exercises      Shoulder Instructions       General Comments SpO2>95% on RA    Pertinent Vitals/ Pain       Pain Assessment Pain Assessment: Faces Faces Pain Scale: No hurt  Home Living                                          Prior Functioning/Environment              Frequency  Min 2X/week        Progress Toward Goals  OT Goals(current goals can now be found in the care plan section)  Progress towards OT goals: Progressing toward goals  Acute Rehab OT Goals Patient Stated Goal: To get out of bed OT Goal Formulation: With patient Time For Goal Achievement: 01/03/22 Potential to Achieve Goals: Good ADL Goals Pt Will Perform Lower Body Dressing: Independently;sit to/from stand Pt Will Transfer to Toilet: with modified independence;ambulating Additional ADL Goal #1: Pt will indep complete 3 step way finding task Additional ADL Goal #2: Pt will indep complete IADL medication management task  Plan Discharge plan needs to be updated    Co-evaluation                 AM-PAC OT "6 Clicks"  Daily Activity     Outcome Measure   Help from another person eating meals?: None Help from another person taking care of personal grooming?: A Little Help from another person toileting, which includes using toliet, bedpan, or urinal?: A Little Help from another person bathing (including washing, rinsing, drying)?: A Little Help from another person to put on and taking off regular upper body clothing?: A Little Help from another person to put on and taking off regular lower body clothing?: A Lot 6 Click Score: 18    End of Session Equipment Utilized During Treatment: Gait belt;Rolling walker (2  wheels)  OT Visit Diagnosis: Unsteadiness on feet (R26.81);Other abnormalities of gait and mobility (R26.89);Muscle weakness (generalized) (M62.81)   Activity Tolerance Patient tolerated treatment well   Patient Left in chair;with call bell/phone within reach;with chair alarm set   Nurse Communication Mobility status        Time: 1006-1030 OT Time Calculation (min): 24 min  Charges: OT General Charges $OT Visit: 1 Visit OT Treatments $Self Care/Home Management : 23-37 mins  Shanda Howells, OTR/L [facility] Acute Rehabilitation Office: [phone]   Shawn Collins 12/28/2021, 10:56 AM

## 2021-12-28 NOTE — Progress Notes (Signed)
Pt to d/c to snf today. Contacted [facility] to advise clinic of pt's d/c to [facility] and pt will resume care tomorrow.   Melven Sartorius Renal Navigator [phone]

## 2021-12-28 NOTE — Progress Notes (Signed)
Patient with pending discharge to [facility]. Report called at this time to [facility]/ [phone] spoke with Maralyn Sago RN and report given. Handoff completed. All questions answered.

## 2021-12-28 NOTE — Discharge Summary (Addendum)
Stroke Discharge Summary  Patient ID: Shawn Collins   MRN: [id number]      DOB: 01-02-1951  Date of Admission: 12/18/2021 Date of Discharge: 12/28/2021  Attending Physician:  Stroke, Md, MD, Stroke MD Consultant(s):   Treatment Team:  Roney Jaffe, MD nephrology  Patient's PCP:  Nolene Ebbs, MD  DISCHARGE DIAGNOSIS: small right cerebellar peduncle infarct s/p TNK likely due to small vessel disease Principal Problem:   Stroke determined by clinical assessment ([place]) Lacunar stroke Gait ataxia Hypertension Hyperlipidemia  Allergies as of 12/28/2021   No Known Allergies      Medication List     STOP taking these medications    metoprolol tartrate 25 MG tablet Commonly known as: LOPRESSOR       TAKE these medications    amLODipine 5 MG tablet Commonly known as: NORVASC Take 1 tablet (5 mg total) by mouth at bedtime. What changed:  medication strength how much to take when to take this   aspirin EC 81 MG tablet Take 1 tablet (81 mg total) by mouth daily. Swallow whole. Start taking on: December 29, 2021 What changed: additional instructions   atorvastatin 40 MG tablet Commonly known as: LIPITOR Take 1 tablet (40 mg total) by mouth daily. Start taking on: December 29, 2021 What changed:  medication strength how much to take   carvedilol 12.5 MG tablet Commonly known as: COREG Take 1 tablet (12.5 mg total) by mouth 2 (two) times daily with a meal. What changed:  medication strength how much to take when to take this   clopidogrel 75 MG tablet Commonly known as: PLAVIX Take 1 tablet (75 mg total) by mouth daily. Start taking on: December 29, 2021   doxercalciferol 4 MCG/2ML injection Commonly known as: HECTOROL Inject 2 mLs (4 mcg total) into the vein every Monday, Wednesday, and Friday with hemodialysis.   furosemide 40 MG tablet Commonly known as: LASIX Take 40 mg by mouth daily.   hydrALAZINE 50 MG tablet Commonly known as: APRESOLINE Take 1  tablet (50 mg total) by mouth 3 (three) times daily. What changed:  medication strength how much to take   Lantus SoloStar 100 UNIT/ML Solostar Pen Generic drug: insulin glargine Inject 15 Units into the skin at bedtime.   losartan 25 MG tablet Commonly known as: COZAAR Take 25 mg by mouth daily.   nitroGLYCERIN 0.4 MG SL tablet Commonly known as: NITROSTAT Place 0.4 mg under the tongue every 5 (five) minutes as needed for chest pain.   Velphoro 500 MG chewable tablet Generic drug: sucroferric oxyhydroxide Chew 500 mg by mouth 3 (three) times daily.        LABORATORY STUDIES CBC    Component Value Date/Time   WBC 9.2 12/24/2021 0358   RBC 4.15 (L) 12/24/2021 0358   HGB 13.2 12/24/2021 0358   HGB 12.2 (L) 12/17/2019 1243   HCT 39.4 12/24/2021 0358   HCT 37.0 (L) 12/17/2019 1243   PLT 265 12/24/2021 0358   PLT 282 12/17/2019 1243   MCV 94.9 12/24/2021 0358   MCV 91 12/17/2019 1243   MCH 31.8 12/24/2021 0358   MCHC 33.5 12/24/2021 0358   RDW 13.2 12/24/2021 0358   RDW 12.9 12/17/2019 1243   LYMPHSABS 1.2 12/18/2021 1811   MONOABS 0.5 12/18/2021 1811   EOSABS 0.1 12/18/2021 1811   BASOSABS 0.1 12/18/2021 1811   CMP    Component Value Date/Time   NA 131 (L) 12/27/2021 1712   NA 138 12/17/2019 1243  K 4.8 12/27/2021 1712   CL 93 (L) 12/27/2021 1712   CO2 26 12/27/2021 1712   GLUCOSE 165 (H) 12/27/2021 1712   BUN 63 (H) 12/27/2021 1712   BUN 30 (H) 12/17/2019 1243   CREATININE 10.29 (H) 12/27/2021 1712   CALCIUM 7.4 (L) 12/27/2021 1712   PROT 8.2 (H) 12/18/2021 1811   PROT 7.1 12/17/2019 1243   ALBUMIN 2.7 (L) 12/27/2021 1712   ALBUMIN 4.0 12/17/2019 1243   AST 27 12/18/2021 1811   ALT 17 12/18/2021 1811   ALKPHOS 74 12/18/2021 1811   BILITOT 0.6 12/18/2021 1811   BILITOT 0.4 12/17/2019 1243   GFRNONAA 5 (L) 12/27/2021 1712   GFRAA 31 (L) 12/17/2019 1243   COAGS Lab Results  Component Value Date   INR 1.1 12/18/2021   INR 1.47 10/26/2016   INR  1.14 10/25/2016   Lipid Panel    Component Value Date/Time   CHOL 160 12/19/2021 0559   CHOL 128 02/18/2020 0950   TRIG 86 12/23/2021 0455   HDL 44 12/19/2021 0559   HDL 40 02/18/2020 0950   CHOLHDL 3.6 12/19/2021 0559   VLDL 12 12/19/2021 0559   LDLCALC 104 (H) 12/19/2021 0559   LDLCALC 71 02/18/2020 0950   HgbA1C  Lab Results  Component Value Date   HGBA1C 5.2 12/18/2021   Urinalysis    Component Value Date/Time   COLORURINE YELLOW 12/20/2021 0312   APPEARANCEUR TURBID (A) 12/20/2021 0312   LABSPEC 1.019 12/20/2021 0312   PHURINE 6.0 12/20/2021 0312   GLUCOSEU NEGATIVE 12/20/2021 0312   HGBUR SMALL (A) 12/20/2021 0312   BILIRUBINUR NEGATIVE 12/20/2021 0312   KETONESUR NEGATIVE 12/20/2021 0312   PROTEINUR 100 (A) 12/20/2021 0312   UROBILINOGEN 1.0 06/27/2010 0631   NITRITE NEGATIVE 12/20/2021 0312   LEUKOCYTESUR MODERATE (A) 12/20/2021 0312   Urine Drug Screen     Component Value Date/Time   LABOPIA NONE DETECTED 12/20/2021 0312   COCAINSCRNUR NONE DETECTED 12/20/2021 0312   LABBENZ NONE DETECTED 12/20/2021 0312   AMPHETMU NONE DETECTED 12/20/2021 0312   THCU NONE DETECTED 12/20/2021 0312   LABBARB NONE DETECTED 12/20/2021 0312    Alcohol Level    Component Value Date/Time   ETH <10 12/18/2021 1950     SIGNIFICANT DIAGNOSTIC STUDIES MR BRAIN WO CONTRAST  Result Date: 12/23/2021 CLINICAL DATA:  Follow-up examination for stroke. EXAM: MRI HEAD WITHOUT CONTRAST TECHNIQUE: Multiplanar, multiecho pulse sequences of the brain and surrounding structures were obtained without intravenous contrast. COMPARISON:  Prior MRI from 12/19/2021. FINDINGS: Brain: Previously identified 6 mm acute ischemic infarct at the right cerebellar peduncle again seen, stable in size and appearance from prior. No associated hemorrhage or mass effect. Additional previously question subacute ischemic infarct at the posterior limb of the left internal capsule is not visualized on this exam. No  other new areas of acute or interval infarction. Underlying atrophy with chronic small vessel ischemic disease again noted. Multiple remote lacunar infarcts noted about the hemispheric cerebral white matter, deep gray nuclei, and left cerebellum. No acute intracranial hemorrhage. Few subcentimeter chronic micro hemorrhages involving the left cerebellum and right occipital lobe noted, stable. No mass lesion, mass effect, or midline shift. No hydrocephalus or extra-axial fluid collection. Pituitary gland and suprasellar region within normal limits. Vascular: Absent flow void within the left V4 segment, stable, suspected to be occluded. Major intracranial vascular flow voids are otherwise maintained. Skull and upper cervical spine: Craniocervical junction within normal limits. Bone marrow signal intensity normal. Prominent degenerative spondylosis  at C3-4 without high-grade spinal stenosis. No scalp soft tissue abnormality. Sinuses/Orbits: Globes and orbital soft tissues within normal limits. Mild mucosal thickening noted about the ethmoidal air cells and maxillary sinuses. Mastoid air cells are clear. Other: None. IMPRESSION: 1. Stable size and appearance of evolving 6 mm acute ischemic infarct at the right cerebellar peduncle. No associated hemorrhage or mass effect. 2. Additional previously questioned subacute ischemic infarct at the posterior limb of the left internal capsule is not visualized on this exam. 3. No other new infarct or other abnormality identified. 4. Absent flow void within the left V4 segment, consistent with occlusion, stable. 5. Underlying atrophy with chronic small vessel ischemic disease with multiple remote lacunar infarcts as above, stable. Electronically Signed   By: Jeannine Boga M.D.   On: 12/23/2021 04:11   CT ABDOMEN PELVIS W CONTRAST  Result Date: 12/22/2021 CLINICAL DATA:  Abdominal pain, acute, nonlocalized EXAM: CT ABDOMEN AND PELVIS WITH CONTRAST TECHNIQUE: Multidetector  CT imaging of the abdomen and pelvis was performed using the standard protocol following bolus administration of intravenous contrast. RADIATION DOSE REDUCTION: This exam was performed according to the departmental dose-optimization program which includes automated exposure control, adjustment of the mA and/or kV according to patient size and/or use of iterative reconstruction technique. CONTRAST:  121mL OMNIPAQUE IOHEXOL 300 MG/ML  SOLN COMPARISON:  CT 10/16/2016 FINDINGS: Lower chest: No acute abnormality. Hepatobiliary: No focal liver abnormality is seen. There is some higher density material within the gallbladder, likely combination of sludge and vicarious excretion of contrast from prior CT exam. No wall thickening or pericholecystic stranding. Pancreas: Unremarkable. No pancreatic ductal dilatation or surrounding inflammatory changes. Spleen: Normal in size without focal abnormality. Adrenals/Urinary Tract: Adrenal glands are unremarkable. Persistent nephrograms with no significant excretion of contrast on delay compatible with history of end-stage renal disease. There is retained contrast material in the bladder from prior contrast enhanced study. Stomach/Bowel: The stomach is within normal limits. There is no evidence of bowel obstruction. Moderate colonic stool burden.No evidence of appendicitis. Vascular/Lymphatic: Aortoiliac atherosclerosis. No AAA. No lymphadenopathy. Reproductive: Mildly enlarged prostate gland. Other: Trace free fluid in pelvis. 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 This is stable since at least May 2018, and on that exam was noted to be unchanged for at least 6 years and likely benign. Musculoskeletal: No acute osseous abnormality. No suspicious osseous lesion. Mild to moderate bilateral hip osteoarthritis. Multilevel degenerative changes of the spine. IMPRESSION: Moderate colonic stool burden  suggesting constipation. No evidence of bowel obstruction or other acute findings in the abdomen or pelvis. Electronically Signed   By: Maurine Simmering M.D.   On: 12/22/2021 12:28   DG Abd Portable 1V  Result Date: 12/22/2021 CLINICAL DATA:  Nausea EXAM: PORTABLE ABDOMEN - 1 VIEW COMPARISON:  None Available. FINDINGS: No dilated large or small bowel. Moderate volume stool in the rectum. No pathologic calcifications. No organomegaly IMPRESSION: 1. No bowel obstruction. 2. Moderate stool in the rectum. Electronically Signed   By: Suzy Bouchard M.D.   On: 12/22/2021 10:29   CT HEAD WO CONTRAST (5MM)  Result Date: 12/22/2021 CLINICAL DATA:  Stroke, follow-up.  Hypertension. EXAM: CT HEAD WITHOUT CONTRAST TECHNIQUE: Contiguous axial images were obtained from the base of the skull through the vertex without intravenous contrast. RADIATION DOSE REDUCTION: This exam was performed according to the departmental dose-optimization program which includes automated exposure control, adjustment of the mA and/or kV  according to patient size and/or use of iterative reconstruction technique. COMPARISON:  CT and MRI studies 7/22 and 12/19/2021 FINDINGS: Brain: No acute finding by CT. Age related volume loss with chronic small-vessel ischemic changes of the cerebral hemispheric white matter. No cortical or large vessel territory infarction. No mass lesion, hemorrhage, hydrocephalus or extra-axial collection. No CT finding at the site of either punctate acute infarction suggested by MRI. Vascular: There is atherosclerotic calcification of the major vessels at the base of the brain. Skull: Negative Sinuses/Orbits: Clear/normal Other: None IMPRESSION: No acute CT finding. Atrophy and chronic small-vessel ischemic changes as seen previously. Vascular calcification. No CT finding at the site of either punctate acute infarction suggested by MRI. Electronically Signed   By: Nelson Chimes M.D.   On: 12/22/2021 08:35   MR BRAIN WO  CONTRAST  Result Date: 12/19/2021 CLINICAL DATA:  Provided history: Neuro deficit, acute, stroke suspected. EXAM: MRI HEAD WITHOUT CONTRAST TECHNIQUE: Multiplanar, multiecho pulse sequences of the brain and surrounding structures were obtained without intravenous contrast. COMPARISON:  CT angiogram head/neck 12/18/2021. Non-contrast head CT 12/18/2021. FINDINGS: Intermittently motion degraded examination (with up to moderate motion degradation of the acquired sequences). Brain: Moderate generalized cerebral atrophy. Comparatively mild cerebellar atrophy. 6 mm acute infarct at the junction of the posterior pons and middle cerebellar peduncle on the right (series 2, image 15) (series 3, image 17). 3 mm focus of subtle diffusion weighted signal abnormality within the posterior limb of left internal capsule, likely reflecting an additional small acute infarct (for instance as seen on series 2, image 29) (series 3, image 20). Chronic small-vessel infarcts within bilateral cerebral hemispheric white matter and within the left basal ganglia/internal capsule. Background moderate multifocal T2 FLAIR hyperintense signal abnormality within the cerebral white matter, nonspecific but compatible chronic small vessel ischemic disease. Tiny T2 hyperintense focus within the left middle cerebellar peduncle, which may reflect a prominent perivascular space or chronic lacunar infarct (series 6, image 8). Suspected tiny chronic infarct within the inferior right cerebellar hemisphere (series 6, image 5). Chronic microhemorrhage within the medial left cerebellar hemisphere. No evidence of an intracranial mass. No extra-axial fluid collection. No midline shift. Vascular: Signal abnormality within the intracranial left vertebral artery compatible with vessel occlusion (as was demonstrated on the CTA head/neck 12/18/2021). Flow voids preserved elsewhere within the proximal large arterial vessels. Skull and upper cervical spine: No focal  suspicious marrow lesion. Incompletely assessed cervical spondylosis. Sinuses/Orbits: Minimal mucosal thickening within the bilateral frontal and ethmoid sinuses. IMPRESSION: 1. Motion degraded examination. 2. 6 mm acute infarct at the junction of the posterior pons and middle cerebellar peduncle on the right. 3. Probable additional 3 mm acute infarct within the posterior limb of left internal capsule. 4. Background cerebral atrophy, chronic small vessel ischemic disease and chronic infarcts as detailed. 5. Signal abnormality within the intracranial left vertebral artery compatible with vessel occlusion (as was demonstrated on the CTA head/neck of 12/18/2021). Electronically Signed   By: Kellie Simmering D.O.   On: 12/19/2021 18:21   ECHOCARDIOGRAM COMPLETE  Result Date: 12/19/2021    ECHOCARDIOGRAM REPORT   Patient Name:   Shawn Collins Date of Exam: 12/19/2021 Medical Rec #:  [id number]     Height:       68.0 in Accession #:    [id number]    Weight:       121.5 lb Date of Birth:  1950/12/16     BSA:          1.653 m Patient  Age:    47 years      BP:           156/81 mmHg Patient Gender: M             HR:           64 bpm. Exam Location:  Inpatient Procedure: 2D Echo, Cardiac Doppler and Color Doppler Indications:    Stroke I63.9  History:        Patient has prior history of Echocardiogram examinations, most                 recent 12/01/2017. CAD, Prior CABG, COPD; Risk Factors:Diabetes,                 Hypertension and Former Smoker. End stage renal disease.  Sonographer:    Darlina Sicilian RDCS Referring Phys: [id number] [place]  1. Mid cavitary obstruction with max instantaneous gradient of 32 mmHg. Flow acceration at the basal septum and mid cavity.. Left ventricular ejection fraction, by estimation, is >75%. The left ventricle has hyperdynamic function. The left ventricle has  no regional wall motion abnormalities. Left ventricular diastolic parameters are indeterminate.  2. Right ventricular  systolic function is hyperdynamic. The right ventricular size is normal. There is normal pulmonary artery systolic pressure. The estimated right ventricular systolic pressure is 46.9 mmHg.  3. Mitral valve has a mild post inflammatory appearance with leaflet excursion. The mitral valve is grossly normal. Trivial mitral valve regurgitation. No evidence of mitral stenosis.  4. The aortic valve is abnormal. There is moderate calcification of the aortic valve. Aortic valve regurgitation is not visualized. Aortic valve sclerosis/calcification is present, without any evidence of aortic stenosis.  5. The inferior vena cava is normal in size with greater than 50% respiratory variability, suggesting right atrial pressure of 3 mmHg. Conclusion(s)/Recommendation(s): No intracardiac source of embolism detected on this transthoracic study. Consider a transesophageal echocardiogram to exclude cardiac source of embolism if clinically indicated. FINDINGS  Left Ventricle: Mid cavitary obstruction with max instantaneous gradient of 32 mmHg. Flow acceration at the basal septum and mid cavity. Left ventricular ejection fraction, by estimation, is >75%. The left ventricle has hyperdynamic function. The left ventricle has no regional wall motion abnormalities. The left ventricular internal cavity size was normal in size. There is no left ventricular hypertrophy. Left ventricular diastolic parameters are indeterminate. Right Ventricle: The right ventricular size is normal. No increase in right ventricular wall thickness. Right ventricular systolic function is hyperdynamic. There is normal pulmonary artery systolic pressure. The tricuspid regurgitant velocity is 2.28 m/s, and with an assumed right atrial pressure of 3 mmHg, the estimated right ventricular systolic pressure is 62.9 mmHg. Left Atrium: Left atrial size was normal in size. Right Atrium: Right atrial size was normal in size. Pericardium: There is no evidence of pericardial  effusion. Mitral Valve: Mitral valve has a mild post inflammatory appearance with leaflet excursion. The mitral valve is grossly normal. Trivial mitral valve regurgitation. No evidence of mitral valve stenosis. Tricuspid Valve: The tricuspid valve is normal in structure. Tricuspid valve regurgitation is mild . No evidence of tricuspid stenosis. Aortic Valve: The aortic valve is abnormal. There is moderate calcification of the aortic valve. Aortic valve regurgitation is not visualized. Aortic valve sclerosis/calcification is present, without any evidence of aortic stenosis. Pulmonic Valve: The pulmonic valve was normal in structure. Pulmonic valve regurgitation is not visualized. No evidence of pulmonic stenosis. Aorta: The aortic root is normal in size and structure. Venous:  The inferior vena cava is normal in size with greater than 50% respiratory variability, suggesting right atrial pressure of 3 mmHg. IAS/Shunts: The atrial septum is grossly normal.  LEFT VENTRICLE PLAX 2D LVIDd:         3.70 cm     Diastology LVIDs:         2.10 cm     LV e' medial:    4.99 cm/s LV PW:         1.00 cm     LV E/e' medial:  16.0 LV IVS:        0.90 cm     LV e' lateral:   6.13 cm/s LVOT diam:     2.00 cm     LV E/e' lateral: 13.0 LV SV:         74 LV SV Index:   45 LVOT Area:     3.14 cm  LV Volumes (MOD) LV vol d, MOD A4C: 94.0 ml LV vol s, MOD A4C: 10.5 ml LV SV MOD A4C:     94.0 ml RIGHT VENTRICLE RV S prime:     14.00 cm/s TAPSE (M-mode): 1.7 cm LEFT ATRIUM             Index        RIGHT ATRIUM          Index LA diam:        2.40 cm 1.45 cm/m   RA Area:     9.57 cm LA Vol (A2C):   37.1 ml 22.44 ml/m  RA Volume:   17.10 ml 10.34 ml/m LA Vol (A4C):   32.1 ml 19.41 ml/m LA Biplane Vol: 36.0 ml 21.77 ml/m  AORTIC VALVE LVOT Vmax:   112.00 cm/s LVOT Vmean:  75.400 cm/s LVOT VTI:    0.235 m  AORTA Ao Root diam: 2.90 cm MITRAL VALVE               TRICUSPID VALVE MV Area (PHT): 2.15 cm    TR Peak grad:   20.8 mmHg MV Decel  Time: 353 msec    TR Vmax:        228.00 cm/s MV E velocity: 79.70 cm/s MV A velocity: 83.80 cm/s  SHUNTS MV E/A ratio:  0.95        Systemic VTI:  0.24 m                            Systemic Diam: 2.00 cm Cherlynn Kaiser MD Electronically signed by Cherlynn Kaiser MD Signature Date/Time: 12/19/2021/12:49:12 PM    Final    CT ANGIO HEAD NECK W WO CM (CODE STROKE)  Result Date: 12/18/2021 CLINICAL DATA:  Provided history: Neuro deficit, acute, stroke suspected. EXAM: CT ANGIOGRAPHY HEAD AND NECK TECHNIQUE: Multidetector CT imaging of the head and neck was performed using the standard protocol during bolus administration of intravenous contrast. Multiplanar CT image reconstructions and MIPs were obtained to evaluate the vascular anatomy. Carotid stenosis measurements (when applicable) are obtained utilizing NASCET criteria, using the distal internal carotid diameter as the denominator. RADIATION DOSE REDUCTION: This exam was performed according to the departmental dose-optimization program which includes automated exposure control, adjustment of the mA and/or kV according to patient size and/or use of iterative reconstruction technique. CONTRAST:  57mL OMNIPAQUE IOHEXOL 350 MG/ML SOLN COMPARISON:  Noncontrast head CT performed earlier today 12/18/2021. FINDINGS: CTA NECK FINDINGS Aortic arch: Standard aortic branching. The visualized aortic arch is normal in  caliber. Minimal atherosclerotic plaque within the proximal left subclavian artery. No hemodynamically significant innominate or proximal subclavian artery stenosis. No hemodynamically significant innominate or proximal subclavian artery stenosis. Right carotid system: CCA and ICA patent within the neck without hemodynamically significant stenosis (50% or greater). Mild atherosclerotic plaque within the proximal ICA. Left carotid system: CCA and ICA patent within the neck without stenosis or significant atherosclerotic disease. Vertebral arteries: The right  vertebral artery is patent within the neck. Mild to moderate atherosclerotic narrowing at the origin of this vessel. There is also mild atherosclerotic narrowing at the level of the skull base. Only intermittent enhancement is seen within the left vertebral artery within the neck, and this vessel is likely functionally occluded. Skeleton: Cervical spondylosis. No acute fracture or aggressive osseous lesion. Other neck: No neck mass or cervical lymphadenopathy. Upper chest: Prior median sternotomy. No consolidation within the imaged lung apices. Review of the MIP images confirms the above findings CTA HEAD FINDINGS Anterior circulation: The intracranial internal carotid arteries are patent. Atherosclerotic plaque within both vessels. Moderate stenosis within the supraclinoid right ICA. No more than mild stenosis elsewhere within the cranial ICAs. The M1 middle cerebral arteries are patent. Atherosclerotic irregularity of the M2 and more distal MCA vessels, bilaterally. Most notably, there is a moderate stenosis within an inferior division proximal right M2 MCA vessel (series 8, image 18). The anterior cerebral arteries are patent. Posterior circulation: The intracranial right vertebral artery is patent. Atherosclerotic plaque within this vessel with up to moderate stenosis. Only intermittent enhancement is seen within the intracranial left vertebral artery and this vessel is likely functionally occluded. At least some enhancement is seen within the proximal left PICA. The basilar artery is patent. The posterior cerebral arteries are patent. Hypoplastic left P1 segment with sizable left posterior communicating artery. A sizable right posterior communicating artery is also present. Moderate stenosis within the left P2 segment. Venous sinuses: Within the limitations of contrast timing, no convincing thrombus. Anatomic variants: As described. Review of the MIP images confirms the above findings These results were called  by telephone at the time of interpretation on 12/18/2021 at 7:23 pm to provider [facility] , who verbally acknowledged these results. IMPRESSION: CTA neck: 1. 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 2. The right vertebral artery is patent. Mild-to-moderate atherosclerotic narrowing at the origin of this vessel. 3. The common carotid and internal carotid arteries are patent within the neck without hemodynamically significant stenosis. Mild atherosclerotic plaque about the right carotid bifurcation and within the proximal right ICA. 4. Cervical spondylosis. CTA head: 1. Only intermittent enhancement is seen within the intracranial left vertebral artery and this vessel is likely functionally occluded. At least some enhancement is seen within the proximal left PICA. 2. Additional intracranial atherosclerotic disease with multifocal stenoses, as outlined and most notably as follows. Moderate stenosis within the supraclinoid right ICA. Moderate stenosis within an inferior division proximal M2 right MCA vessel. Electronically Signed   By: Kellie Simmering D.O.   On: 12/18/2021 19:25   CT HEAD CODE STROKE WO CONTRAST  Result Date: 12/18/2021 CLINICAL DATA:  Code stroke. Neuro deficit, acute, stroke suspected. EXAM: CT HEAD WITHOUT CONTRAST TECHNIQUE: Contiguous axial images were obtained from the base of the skull through the vertex without intravenous contrast. RADIATION DOSE REDUCTION: This exam was performed according to the departmental dose-optimization program which includes automated exposure control, adjustment of the mA and/or kV according to patient  size and/or use of iterative reconstruction technique. COMPARISON:  No pertinent prior exams available for comparison. FINDINGS: Brain: Moderate generalized cerebral atrophy.  Mild cerebellar atrophy. Chronic appearing lacunar infarct within the right frontoparietal white  matter (series 4, image 22). Age-indeterminate lacunar infarct within the posterior limb of left internal capsule (series 4, image 15). Chronic lacunar infarcts elsewhere within the left internal capsule and lentiform nucleus. Background mild-to-moderate patchy and ill-defined hypoattenuation within the cerebral white matter, nonspecific but compatible with chronic small vessel disease. There is no acute intracranial hemorrhage. No demarcated cortical infarct. No extra-axial fluid collection. No evidence of an intracranial mass. No midline shift. Vascular: No hyperdense vessel.  Atherosclerotic calcifications. Skull: No fracture or aggressive osseous lesion. Sinuses/Orbits: No mass or acute finding within the imaged orbits. Minimal mucosal thickening scattered within the paranasal sinuses at the imaged levels. ASPECTS ([place] Stroke Program Early CT Score) - Ganglionic level infarction (caudate, lentiform nuclei, internal capsule, insula, M1-M3 cortex): 6 - Supraganglionic infarction (M4-M6 cortex): 3 Total score (0-10 with 10 being normal): 9 Age-indeterminate lacunar infarct within the posterior limb of left internal capsule. These results were communicated to Dr. Curly Shores At 6:34 pmon 7/22/2023by text page via the [facility] messaging system. IMPRESSION: Age-indeterminate lacunar infarct within the posterior limb of left internal capsule. ASPECTS is 9. Background parenchymal atrophy, chronic small vessel ischemic disease and chronic lacunar infarcts, as described. Electronically Signed   By: Kellie Simmering D.O.   On: 12/18/2021 18:34   DG Chest Port 1 View  Result Date: 12/18/2021 CLINICAL DATA:  Vomiting and hypertension. EXAM: PORTABLE CHEST 1 VIEW COMPARISON:  01/09/2017 FINDINGS: Postoperative changes in the mediastinum. Right central venous catheter with tip over the low SVC region. Heart size and pulmonary vascularity are normal. Lungs are clear. No pleural effusions. No pneumothorax. Mediastinal contours  appear intact. IMPRESSION: No active disease. Electronically Signed   By: Lucienne Capers M.D.   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  Symptoms improved somewhat, and he has been working with PT and OT.  He did experience acute onset nausea and hiccups at one point, but workup was negative and symptoms resolved with administration of chlorpromazine.  He is now ready for discharge to a SNF for rehabilitation.  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  Moderate stenosis within the supraclinoid right ICA. Moderate stenosis within an inferior division proximal M2 right MCA vessel. MRI Brain 7/23:  6 mm acute infarct at the junction of the posterior pons and middle cerebellar peduncle on the right. 3 mm later subacute infarct within the PLIC MRI brain 7/01 - stable right cerebellar peduncle infarct, previous small left PLIC infarct not visualized. 2D Echo EF > 75% LDL 104 A1C 5.2 UDS Negative VTE prophylaxis - heparin subcu aspirin 81 mg daily prior to admission, now on ASA and plavix DAPT for 3 weeks and then plavix alone.  PT/OT: CIR->now SNF   Hiccups/nausea, acute onset, resolved 7/26 Patient c/o hiccups and nausea with dry heaves, ongoing nausea 7/28 without hiccups Repeat  CT 7/26 no acute abnormality Repeat MRI 7/26 no new acute abnormalities KUB and CT abdomen negative Thorazine 25 mg IV x1 and 25 mg PO TID PRN Continue to monitor   Hypertension Home meds:  amlodipine 10mg /d, carvedilol 6.25mg  bid, furosamide  40mg /d, losartan 25mg /d On the high end Off of clevidipne IV since 7/27 Currently on amlodipine 5 bid, coreg 12.5 bid, hydralazine 25->50 tid, losartan 25 bid Long-term BP goal normotensive   Hyperlipidemia Home meds:  atorvastatin 20mg /d  LDL 104, goal < 70 Now on atorvastatin 40mg  Continue statin at discharge   Diabetes type II  Well Controlled per A1C result Home meds:  glargine insulin 10u p.m. HgbA1c 5.2, goal < 7.0 CBGs:  83 Sliding Scale Insulin Close PCP follow-up for DM control   Constipation  On senokot bid Fleet enema PRN dulcolax suppository Encourage po intake Gentle IVF   Other Stroke Risk Factors Advanced Age >/= 65  COPD/Emphysema: former Cigarette smoker Family hx stroke (father) Coronary artery disease s/p CABG   Other active problems ESRD on HD per Nephro following. HD done 7/24 inpatient, to be complete again today 7/28 S/p new left arm AVF with VVS 7/25 QT prolongation, repeat EKG, now QTc within range. Lethargy - EKG, troponin negative   RN Pressure Injury Documentation:     DISCHARGE EXAM Blood pressure (!) 117/58, pulse 64, temperature 98.9 F (37.2 C), temperature source Oral, resp. rate 14, height 5\' 8"  (1.727 m), weight 57.5 kg, SpO2 99 %. Neurological Exam: Mental status/Cognition: awake and alert today. Oriented to person and place.  Speech/language: Fluent, comprehension intact. Speech clear, no dysarthria CN: 2-12: PERRL, EOMI. Face symmetric. Tongue midline.     Motor:  Muscle bulk and tone: normal Normal strength in b/l UE with no dirft.  Tremor: none.   Reflexes: deferred   Sensation: Normal LT throughout UE/LE.   Coordination/Complex Motor:  - Finger to Nose intact BL - Gait: deferred  Discharge Diet       Diet   Diet heart healthy/carb modified Room service appropriate? Yes; Fluid consistency: Thin   liquids  DISCHARGE PLAN Disposition:  to SNF for rehabilitation aspirin 81 mg daily and clopidogrel 75 mg  daily for secondary stroke prevention for 3 weeks then clopidogrel alone. Ongoing stroke risk factor control by Primary Care Physician at time of discharge Follow-up PCP Nolene Ebbs, MD in 2 weeks. Follow-up in [facility] in 8 weeks, office to schedule an appointment.   32 minutes were spent preparing discharge.  [place] , MSN, AGACNP-BC [facility] See Amion for schedule and pager information 12/28/2021 1:53 PM   I have personally obtained history,examined this patient, reviewed notes, independently viewed imaging studies, participated in medical decision making and plan of care.ROS completed by me personally and pertinent positives fully documented  I have made any additions or clarifications directly to the above note. Agree with note above.    Antony Contras, MD Medical Director [facility] Pager: [phone] 12/28/2021 2:49 PM

## 2021-12-28 NOTE — Progress Notes (Signed)
Physical Therapy Treatment Patient Details Name: Shawn Collins MRN: [id number] DOB: 1950/10/27 Today's Date: 12/28/2021   History of Present Illness Pt is a 71 y.o. male who presented 12/18/21 with sudden N/V and dizziness. Pt administered TNK. 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  Had L UE AV fistula creation for HD on 12/21/21.  PMH: COPD, DM2, HTN, Hx of NSTEMI, CKD/ESRD, CAD    PT Comments    Pt progressing towards all goals. Pt with improved gait kinematics and activity tolerance but remains to require RW for safe ambulation where pt didn't use any AD PTA. Worked on standing and seated LE exercise program. Pt with noted bloody nose today, RN aware. Acute PT to cont to follow. Aware family prefers SNF over AIR.    Recommendations for follow up therapy are one component of a multi-disciplinary discharge planning process, led by the attending physician.  Recommendations may be updated based on patient status, additional functional criteria and insurance authorization.  Follow Up Recommendations  Skilled nursing-short term rehab (<3 hours/day)     Assistance Recommended at Discharge Frequent or constant Supervision/Assistance  Patient can return home with the following Assistance with cooking/housework;Direct supervision/assist for medications management;Direct supervision/assist for financial management;Assist for transportation;Help with stairs or ramp for entrance;A little help with walking and/or transfers;A little help with bathing/dressing/bathroom   Equipment Recommendations  Rolling walker (2 wheels);BSC/3in1    Recommendations for Other Services Rehab consult     Precautions / Restrictions  Precautions Precautions: Fall Precaution Comments: New AV fistula L UE- excessive swelling, nose bleed Restrictions Weight Bearing Restrictions: No     Mobility  Bed Mobility               General bed mobility comments: pt up in recliner upon PT arrival    Transfers Overall transfer level: Needs assistance Equipment used: Rolling walker (2 wheels) Transfers: Sit to/from Stand Sit to Stand: Min guard           General transfer comment: verbal cues for hand placement and to push up from bed not pull up on walker, min guard A for safety    Ambulation/Gait Ambulation/Gait assistance: Min assist Gait Distance (Feet): 150 Feet Assistive device: Rolling walker (2 wheels) Gait Pattern/deviations: Step-through pattern, Decreased stride length, Narrow base of support, Trunk flexed Gait velocity: dec Gait velocity interpretation: <1.31 ft/sec, indicative of household ambulator   General Gait Details: minA for walker management, verbal cues to stay in walker and not push to far out in front of self, pt with improved base of support and more fluid gait pattern   Stairs             Wheelchair Mobility    Modified Rankin (Stroke Patients Only) Modified Rankin (Stroke Patients Only) Pre-Morbid Rankin Score: No symptoms Modified Rankin: Moderate disability     Balance Overall balance assessment: Needs assistance Sitting-balance support: No upper extremity supported, Feet supported, Feet unsupported Sitting balance-Leahy Scale: Good   Postural control: Left lateral lean Standing balance support: Bilateral upper extremity supported, During functional activity, Reliant on assistive device for balance Standing balance-Leahy Scale: Poor Standing balance comment: Reliant on RW for dynamic balance. Able to perform static standing grooming tasks with min guard  Cognition Arousal/Alertness: Awake/alert Behavior During Therapy: WFL  for tasks assessed/performed Overall Cognitive Status: No family/caregiver present to determine baseline cognitive functioning Area of Impairment: Memory, Safety/judgement, Awareness, Problem solving, Following commands                     Memory: Decreased short-term memory Following Commands: Follows one step commands consistently, Follows one step commands with increased time, Follows multi-step commands inconsistently, Follows multi-step commands with increased time Safety/Judgement: Decreased awareness of deficits, Decreased awareness of safety Awareness: Emergent Problem Solving: Slow processing, Difficulty sequencing, Requires verbal cues General Comments: pt with difficulty sequencing exercise instructions requiring max verbal cues        Exercises General Exercises - Lower Extremity Heel Slides: AROM, Both, 10 reps, Seated (against manual resistance) Hip ABduction/ADduction: AROM, Both, 10 reps, Seated (against manual resistance) Hip Flexion/Marching: AROM, Both, 10 reps, Standing (more of a focus on SLS, slow and controlled marching) Mini-Sqauts: AROM, Both, 10 reps, Standing    General Comments General comments (skin integrity, edema, etc.): VSS      Pertinent Vitals/Pain Pain Assessment Pain Assessment: No/denies pain    Home Living                          Prior Function            PT Goals (current goals can now be found in the care plan section) Acute Rehab PT Goals Patient Stated Goal: to walk PT Goal Formulation: With patient Time For Goal Achievement: 01/03/22 Potential to Achieve Goals: Good Progress towards PT goals: Progressing toward goals    Frequency    Min 3X/week      PT Plan Discharge plan needs to be updated;Frequency needs to be updated    Co-evaluation              AM-PAC PT "6 Clicks" Mobility   Outcome Measure  Help needed turning from your back to your side while in a flat bed without using bedrails?:  A Little Help needed moving from lying on your back to sitting on the side of a flat bed without using bedrails?: A Little Help needed moving to and from a bed to a chair (including a wheelchair)?: A Little Help needed standing up from a chair using your arms (e.g., wheelchair or bedside chair)?: A Little Help needed to walk in hospital room?: A Lot Help needed climbing 3-5 steps with a railing? : Total 6 Click Score: 15    End of Session Equipment Utilized During Treatment: Gait belt Activity Tolerance: Patient tolerated treatment well Patient left: in chair;with call bell/phone within reach Nurse Communication: Mobility status PT Visit Diagnosis: Other abnormalities of gait and mobility (R26.89);Dizziness and giddiness (R42);Difficulty in walking, not elsewhere classified (R26.2);Muscle weakness (generalized) (M62.81);Other symptoms and signs involving the nervous system (R29.898);Unsteadiness on feet (R26.81)     Time: 5093-2671 PT Time Calculation (min) (ACUTE ONLY): 18 min  Charges:  $Gait Training: 8-22 mins                     Kittie Plater, PT, DPT Acute Rehabilitation Services Secure chat preferred Office #: [phone]    Berline Lopes 12/28/2021, 1:45 PM

## 2021-12-28 NOTE — Progress Notes (Signed)
[facility] NEPHROLOGY PROGRESS NOTE  Assessment/ Plan: OP HD: [facility] MWF 4h  400/800   56.5kg  2/2.5 bath TDC  Hep 8000 - sensipar 30 tiw po - doxecalciferol 4 ug tiw IV - no esa/ Fe  # Acute CVA - sp TNK, right cerebellar infarction likely due to small vessel disease, followed by neurology.    # ESRD: HD MWF.  Status post dialysis yesterday with about 1 L UF, tolerated well.  Regular HD tomorrow.  Ok to discharge from renal perspective.  It seems like he is now waiting for SNF.  #HD access - VVS placed a new  LFA AVG here on 7/25 (pt has had only [facility] for access). Appreciate assistance.   # Anemia esrd - Hb above goal, no esa needs.  # CKD-MBD/secondary hyperparathyroidism: Hold Sensipar because of hypercalcemia.  Continue IV vdra w/ hd.  Cont velphoro as binder.   #HTN/Volume - has chronic HTN w/ possible nonadherence at times. SP Cleviprex gtt. BP soft therefore changed losartan and amlodipine to once a day.  UF during HD.  Subjective: Seen and examined at bedside.  No new event.  Denies nausea, vomiting, chest pain, shortness of breath. Objective Vital signs in last 24 hours: Vitals:   12/27/21 2311 12/28/21 0312 12/28/21 0819 12/28/21 1104  BP: (!) 132/59 (!) 121/57 (!) 108/43 (!) 117/58  Pulse: 69 67 64 64  Resp: 17 15 19 14   Temp: 98.5 F (36.9 C) 98.7 F (37.1 C) 98.3 F (36.8 C) 98.9 F (37.2 C)  TempSrc: Oral Oral Oral Oral  SpO2: 99% 99% 99% 99%  Weight:      Height:       Weight change:   Intake/Output Summary (Last 24 hours) at 12/28/2021 1206 Last data filed at 12/27/2021 2100 Gross per 24 hour  Intake 240 ml  Output 1001 ml  Net -761 ml        Labs: RENAL PANEL Recent Labs    12/18/21 1811 12/18/21 1822 12/19/21 0559 12/20/21 0238 12/21/21 0418 12/21/21 0922 12/22/21 0626 12/23/21 0455 12/24/21 0358 12/27/21 1712  NA 140 140  --  137 133* 131* 131* 133* 135 131*  K 4.0 4.0  --  5.0 4.3 4.4 4.4 4.9 4.9 4.8  CL 102 103  --   101 93* 95* 95* 95* 95* 93*  CO2 22  --   --  20* 27  --  16* 22 23 26   GLUCOSE 133* 136*  --  112* 127* 114* 182* 110* 83 165*  BUN 25* 27*  --  51* 31* 35* 46* 22 44* 63*  CREATININE 7.57* 8.20*  --  10.24* 7.37* 8.50* 9.81* 5.73* 8.16* 10.29*  CALCIUM 8.7*  --   --  8.2* 7.9*  --  8.3* 8.3* 8.4* 7.4*  MG  --   --  2.2  --   --   --   --   --   --   --   PHOS  --   --   --  7.5*  --   --   --   --   --  5.5*  ALBUMIN 4.1  --   --   --   --   --   --   --   --  2.7*      Liver Function Tests: Recent Labs  Lab 12/27/21 1712  ALBUMIN 2.7*   No results for input(s): "LIPASE", "AMYLASE" in the last 168 hours. No results for input(s): "AMMONIA"  in the last 168 hours. CBC: Recent Labs    12/21/21 0922 12/22/21 0626 12/22/21 0811 12/23/21 0455 12/24/21 0358  HGB 14.3 13.9 13.9 12.4* 13.2  MCV  --  91.7 94.8 94.1 94.9     Cardiac Enzymes: No results for input(s): "CKTOTAL", "CKMB", "CKMBINDEX", "TROPONINI" in the last 168 hours. CBG: Recent Labs  Lab 12/27/21 0606 12/27/21 1124 12/27/21 1558 12/27/21 2125 12/28/21 0611  GLUCAP 95 175* 161* 114* 145*     Iron Studies: No results for input(s): "IRON", "TIBC", "TRANSFERRIN", "FERRITIN" in the last 72 hours. Studies/Results: No results found.  Medications: Infusions:    Scheduled Medications:  amLODipine  5 mg Oral QHS   aspirin EC  81 mg Oral Daily   atorvastatin  40 mg Oral Daily   carvedilol  12.5 mg Oral BID WC   Chlorhexidine Gluconate Cloth  6 each Topical Q0600   Chlorhexidine Gluconate Cloth  6 each Topical Q0600   cinacalcet  30 mg Oral Q M,W,F   clopidogrel  75 mg Oral Daily   doxercalciferol  4 mcg Intravenous Q M,W,F-HD   heparin injection (subcutaneous)  5,000 Units Subcutaneous Q8H   hydrALAZINE  50 mg Oral TID   insulin aspart  0-6 Units Subcutaneous TID AC & HS   losartan  25 mg Oral Daily   pantoprazole  40 mg Oral Daily   polyethylene glycol  17 g Oral BID   senna-docusate  1 tablet Oral  BID   sucroferric oxyhydroxide  500 mg Oral TID WC    have reviewed scheduled and prn medications.  Physical Exam: General:NAD, comfortable Heart:RRR, s1s2 nl Lungs:clear b/l, no crackle Abdomen:soft, Non-tender, non-distended Extremities:No edema Dialysis Access: Right IJ TDC intact, new LUA AVG +bruit  Lalana Wachter Tanna Furry 12/28/2021,12:06 PM  LOS: 10 days

## 2021-12-30 ENCOUNTER — Other Ambulatory Visit: Payer: Self-pay

## 2021-12-30 ENCOUNTER — Emergency Department (HOSPITAL_COMMUNITY): Payer: Medicare Other

## 2021-12-30 ENCOUNTER — Encounter (HOSPITAL_COMMUNITY): Payer: Self-pay | Admitting: *Deleted

## 2021-12-30 ENCOUNTER — Observation Stay (HOSPITAL_COMMUNITY)
Admission: EM | Admit: 2021-12-30 | Discharge: 2021-12-31 | Disposition: A | Discharge status: Skilled Nursing Facility | Payer: Medicare Other | Attending: Emergency Medicine | Admitting: Emergency Medicine

## 2021-12-30 DIAGNOSIS — E785 Hyperlipidemia, unspecified: Secondary | ICD-10-CM | POA: Diagnosis present

## 2021-12-30 DIAGNOSIS — Z992 Dependence on renal dialysis: Secondary | ICD-10-CM | POA: Diagnosis not present

## 2021-12-30 DIAGNOSIS — I120 Hypertensive chronic kidney disease with stage 5 chronic kidney disease or end stage renal disease: Secondary | ICD-10-CM | POA: Insufficient documentation

## 2021-12-30 DIAGNOSIS — E1122 Type 2 diabetes mellitus with diabetic chronic kidney disease: Secondary | ICD-10-CM | POA: Diagnosis not present

## 2021-12-30 DIAGNOSIS — E11649 Type 2 diabetes mellitus with hypoglycemia without coma: Secondary | ICD-10-CM | POA: Diagnosis not present

## 2021-12-30 DIAGNOSIS — Z66 Do not resuscitate: Secondary | ICD-10-CM | POA: Diagnosis present

## 2021-12-30 DIAGNOSIS — Z951 Presence of aortocoronary bypass graft: Secondary | ICD-10-CM | POA: Diagnosis not present

## 2021-12-30 DIAGNOSIS — Z87891 Personal history of nicotine dependence: Secondary | ICD-10-CM | POA: Diagnosis not present

## 2021-12-30 DIAGNOSIS — J449 Chronic obstructive pulmonary disease, unspecified: Secondary | ICD-10-CM | POA: Insufficient documentation

## 2021-12-30 DIAGNOSIS — R680 Hypothermia, not associated with low environmental temperature: Secondary | ICD-10-CM | POA: Diagnosis not present

## 2021-12-30 DIAGNOSIS — Z79899 Other long term (current) drug therapy: Secondary | ICD-10-CM | POA: Diagnosis not present

## 2021-12-30 DIAGNOSIS — G9341 Metabolic encephalopathy: Principal | ICD-10-CM | POA: Diagnosis present

## 2021-12-30 DIAGNOSIS — T68XXXA Hypothermia, initial encounter: Secondary | ICD-10-CM

## 2021-12-30 DIAGNOSIS — N186 End stage renal disease: Secondary | ICD-10-CM | POA: Insufficient documentation

## 2021-12-30 DIAGNOSIS — Z7902 Long term (current) use of antithrombotics/antiplatelets: Secondary | ICD-10-CM | POA: Insufficient documentation

## 2021-12-30 DIAGNOSIS — Z8673 Personal history of transient ischemic attack (TIA), and cerebral infarction without residual deficits: Secondary | ICD-10-CM | POA: Diagnosis not present

## 2021-12-30 DIAGNOSIS — I2510 Atherosclerotic heart disease of native coronary artery without angina pectoris: Secondary | ICD-10-CM | POA: Insufficient documentation

## 2021-12-30 DIAGNOSIS — Z7982 Long term (current) use of aspirin: Secondary | ICD-10-CM | POA: Diagnosis not present

## 2021-12-30 DIAGNOSIS — E162 Hypoglycemia, unspecified: Secondary | ICD-10-CM | POA: Diagnosis present

## 2021-12-30 DIAGNOSIS — I10 Essential (primary) hypertension: Secondary | ICD-10-CM | POA: Diagnosis present

## 2021-12-30 DIAGNOSIS — Z794 Long term (current) use of insulin: Secondary | ICD-10-CM | POA: Diagnosis not present

## 2021-12-30 LAB — COMPREHENSIVE METABOLIC PANEL
ALT: 20 U/L (ref 0–44)
AST: 42 U/L — ABNORMAL HIGH (ref 15–41)
Albumin: 3.1 g/dL — ABNORMAL LOW (ref 3.5–5.0)
Alkaline Phosphatase: 51 U/L (ref 38–126)
Anion gap: 12 (ref 5–15)
BUN: 27 mg/dL — ABNORMAL HIGH (ref 8–23)
CO2: 26 mmol/L (ref 22–32)
Calcium: 8.9 mg/dL (ref 8.9–10.3)
Chloride: 100 mmol/L (ref 98–111)
Creatinine, Ser: 5.53 mg/dL — ABNORMAL HIGH (ref 0.61–1.24)
GFR, Estimated: 10 mL/min — ABNORMAL LOW (ref >60)
Glucose, Bld: 110 mg/dL — ABNORMAL HIGH (ref 70–99)
Potassium: 3.9 mmol/L (ref 3.5–5.1)
Sodium: 138 mmol/L (ref 135–145)
Total Bilirubin: 0.6 mg/dL (ref 0.3–1.2)
Total Protein: 6.3 g/dL — ABNORMAL LOW (ref 6.5–8.1)

## 2021-12-30 LAB — CBC WITH DIFFERENTIAL/PLATELET
Abs Immature Granulocytes: 0.05 10*3/uL (ref 0.00–0.07)
Basophils Absolute: 0 10*3/uL (ref 0.0–0.1)
Basophils Relative: 0 %
Eosinophils Absolute: 0 10*3/uL (ref 0.0–0.5)
Eosinophils Relative: 0 %
HCT: 35.7 % — ABNORMAL LOW (ref 39.0–52.0)
Hemoglobin: 11.6 g/dL — ABNORMAL LOW (ref 13.0–17.0)
Immature Granulocytes: 1 %
Lymphocytes Relative: 8 %
Lymphs Abs: 0.7 10*3/uL (ref 0.7–4.0)
MCH: 32 pg (ref 26.0–34.0)
MCHC: 32.5 g/dL (ref 30.0–36.0)
MCV: 98.3 fL (ref 80.0–100.0)
Monocytes Absolute: 0.6 10*3/uL (ref 0.1–1.0)
Monocytes Relative: 7 %
Neutro Abs: 7.5 10*3/uL (ref 1.7–7.7)
Neutrophils Relative %: 84 %
Platelets: 295 10*3/uL (ref 150–400)
RBC: 3.63 MIL/uL — ABNORMAL LOW (ref 4.22–5.81)
RDW: 12.7 % (ref 11.5–15.5)
WBC: 8.9 10*3/uL (ref 4.0–10.5)
nRBC: 0 % (ref 0.0–0.2)

## 2021-12-30 LAB — CBG MONITORING, ED
Glucose-Capillary: 78 mg/dL (ref 70–99)
Glucose-Capillary: 111 mg/dL — ABNORMAL HIGH (ref 70–99)
Glucose-Capillary: 106 mg/dL — ABNORMAL HIGH (ref 70–99)
Glucose-Capillary: 71 mg/dL (ref 70–99)
Glucose-Capillary: 78 mg/dL (ref 70–99)
Glucose-Capillary: 76 mg/dL (ref 70–99)
Glucose-Capillary: 126 mg/dL — ABNORMAL HIGH (ref 70–99)
Glucose-Capillary: 189 mg/dL — ABNORMAL HIGH (ref 70–99)
Glucose-Capillary: 186 mg/dL — ABNORMAL HIGH (ref 70–99)

## 2021-12-30 LAB — PROTIME-INR
INR: 1.2 (ref 0.8–1.2)
Prothrombin Time: 14.8 seconds (ref 11.4–15.2)

## 2021-12-30 LAB — LACTIC ACID, PLASMA
Lactic Acid, Venous: 1 mmol/L (ref 0.5–1.9)
Lactic Acid, Venous: 0.9 mmol/L (ref 0.5–1.9)

## 2021-12-30 LAB — AMMONIA: Ammonia: 31 umol/L (ref 9–35)

## 2021-12-30 LAB — SALICYLATE LEVEL: Salicylate Lvl: <7 mg/dL — ABNORMAL LOW (ref 7.0–30.0)

## 2021-12-30 LAB — HEPATITIS B SURFACE ANTIGEN: Hepatitis B Surface Ag: NONREACTIVE

## 2021-12-30 LAB — TSH: TSH: 1.563 u[IU]/mL (ref 0.350–4.500)

## 2021-12-30 LAB — ACETAMINOPHEN LEVEL: Acetaminophen (Tylenol), Serum: <10 ug/mL — ABNORMAL LOW (ref 10–30)

## 2021-12-30 LAB — ETHANOL: Alcohol, Ethyl (B): <10 mg/dL (ref <10)

## 2021-12-30 LAB — APTT: aPTT: 32 seconds (ref 24–36)

## 2021-12-30 LAB — GLUCOSE, CAPILLARY: Glucose-Capillary: 88 mg/dL (ref 70–99)

## 2021-12-30 LAB — TROPONIN I (HIGH SENSITIVITY)
Troponin I (High Sensitivity): 18 ng/L — ABNORMAL HIGH (ref <18)
Troponin I (High Sensitivity): 20 ng/L — ABNORMAL HIGH (ref <18)

## 2021-12-30 LAB — HEPATITIS C ANTIBODY: HCV Ab: REACTIVE — AB

## 2021-12-30 LAB — HEPATITIS B SURFACE ANTIBODY,QUALITATIVE: Hep B S Ab: NONREACTIVE

## 2021-12-30 LAB — HEPATITIS B CORE ANTIBODY, TOTAL: Hep B Core Total Ab: NONREACTIVE

## 2021-12-30 MED ORDER — DOCUSATE SODIUM 283 MG RE ENEM
1.0000 | ENEMA | RECTAL | Status: DC | PRN
  Filled 2021-12-30: qty 1

## 2021-12-30 MED ORDER — LOSARTAN POTASSIUM 25 MG PO TABS
25.0000 mg | ORAL_TABLET | Freq: Every day | ORAL | Status: DC
  Administered 2021-12-30 – 2021-12-31 (×2): 25 mg via ORAL
  Filled 2021-12-30 (×2): qty 1

## 2021-12-30 MED ORDER — ONDANSETRON HCL 4 MG/2ML IJ SOLN: 4.0000 mg | Freq: Four times a day (QID) | INTRAMUSCULAR | Status: DC | PRN

## 2021-12-30 MED ORDER — HYDRALAZINE HCL 50 MG PO TABS
50.0000 mg | ORAL_TABLET | Freq: Three times a day (TID) | ORAL | Status: DC
  Administered 2021-12-30 – 2021-12-31 (×4): 50 mg via ORAL
  Filled 2021-12-30 (×3): qty 1
  Filled 2021-12-30: qty 2

## 2021-12-30 MED ORDER — NALOXONE HCL 0.4 MG/ML IJ SOLN
INTRAMUSCULAR | Status: AC
  Administered 2021-12-30: 0.4 mg via INTRAVENOUS
  Filled 2021-12-30: qty 1

## 2021-12-30 MED ORDER — INSULIN ASPART 100 UNIT/ML IJ SOLN
0.0000 [IU] | Freq: Three times a day (TID) | INTRAMUSCULAR | Status: DC
  Administered 2021-12-30: 1 [IU] via SUBCUTANEOUS

## 2021-12-30 MED ORDER — ZOLPIDEM TARTRATE 5 MG PO TABS
5.0000 mg | ORAL_TABLET | Freq: Every evening | ORAL | Status: DC | PRN
  Administered 2021-12-30: 5 mg via ORAL
  Filled 2021-12-30: qty 1

## 2021-12-30 MED ORDER — VANCOMYCIN HCL 1250 MG/250ML IV SOLN
1250.0000 mg | Freq: Once | INTRAVENOUS | Status: AC
  Administered 2021-12-30: 1250 mg via INTRAVENOUS
  Filled 2021-12-30: qty 250

## 2021-12-30 MED ORDER — CLOPIDOGREL BISULFATE 75 MG PO TABS
75.0000 mg | ORAL_TABLET | Freq: Every day | ORAL | Status: DC
  Administered 2021-12-30 – 2021-12-31 (×2): 75 mg via ORAL
  Filled 2021-12-30 (×2): qty 1

## 2021-12-30 MED ORDER — ACETAMINOPHEN 325 MG PO TABS: 650.0000 mg | ORAL_TABLET | Freq: Four times a day (QID) | ORAL | Status: DC | PRN

## 2021-12-30 MED ORDER — ONDANSETRON HCL 4 MG PO TABS: 4.0000 mg | ORAL_TABLET | Freq: Four times a day (QID) | ORAL | Status: DC | PRN

## 2021-12-30 MED ORDER — HEPARIN SODIUM (PORCINE) 5000 UNIT/ML IJ SOLN
5000.0000 [IU] | Freq: Three times a day (TID) | INTRAMUSCULAR | Status: DC
  Administered 2021-12-30 – 2021-12-31 (×3): 5000 [IU] via SUBCUTANEOUS
  Filled 2021-12-30 (×4): qty 1

## 2021-12-30 MED ORDER — CARVEDILOL 12.5 MG PO TABS
12.5000 mg | ORAL_TABLET | Freq: Two times a day (BID) | ORAL | Status: DC
  Administered 2021-12-30 – 2021-12-31 (×2): 12.5 mg via ORAL
  Filled 2021-12-30 (×2): qty 1

## 2021-12-30 MED ORDER — NEPRO/CARBSTEADY PO LIQD
237.0000 mL | Freq: Three times a day (TID) | ORAL | Status: DC | PRN
  Filled 2021-12-30: qty 237

## 2021-12-30 MED ORDER — SORBITOL 70 % SOLN
30.0000 mL | Status: DC | PRN
  Filled 2021-12-30: qty 30

## 2021-12-30 MED ORDER — HYDRALAZINE HCL 20 MG/ML IJ SOLN: 5.0000 mg | INTRAMUSCULAR | Status: DC | PRN

## 2021-12-30 MED ORDER — SODIUM CHLORIDE 0.9 % IV SOLN
1.0000 g | Freq: Once | INTRAVENOUS | Status: AC
  Administered 2021-12-30: 1 g via INTRAVENOUS
  Filled 2021-12-30: qty 10

## 2021-12-30 MED ORDER — NALOXONE HCL 0.4 MG/ML IJ SOLN: 0.4000 mg | Freq: Once | INTRAMUSCULAR | Status: AC

## 2021-12-30 MED ORDER — DOXERCALCIFEROL 4 MCG/2ML IV SOLN
4.0000 ug | INTRAVENOUS | Status: DC
Start: 2021-12-31 — End: 2021-12-31
  Administered 2021-12-31: 4 ug via INTRAVENOUS
  Filled 2021-12-30: qty 2

## 2021-12-30 MED ORDER — AMLODIPINE BESYLATE 5 MG PO TABS
5.0000 mg | ORAL_TABLET | Freq: Every day | ORAL | Status: DC
  Administered 2021-12-30: 5 mg via ORAL
  Filled 2021-12-30: qty 1

## 2021-12-30 MED ORDER — CHLORHEXIDINE GLUCONATE CLOTH 2 % EX PADS: 6.0000 | MEDICATED_PAD | Freq: Every day | CUTANEOUS | Status: DC

## 2021-12-30 MED ORDER — CALCIUM CARBONATE ANTACID 1250 MG/5ML PO SUSP
500.0000 mg | Freq: Four times a day (QID) | ORAL | Status: DC | PRN
  Filled 2021-12-30: qty 5

## 2021-12-30 MED ORDER — SUCROFERRIC OXYHYDROXIDE 500 MG PO CHEW
500.0000 mg | CHEWABLE_TABLET | Freq: Three times a day (TID) | ORAL | Status: DC
  Administered 2021-12-30 – 2021-12-31 (×4): 500 mg via ORAL
  Filled 2021-12-30 (×7): qty 1

## 2021-12-30 MED ORDER — CAMPHOR-MENTHOL 0.5-0.5 % EX LOTN
1.0000 | TOPICAL_LOTION | Freq: Three times a day (TID) | CUTANEOUS | Status: DC | PRN
  Filled 2021-12-30: qty 222

## 2021-12-30 MED ORDER — ACETAMINOPHEN 650 MG RE SUPP: 650.0000 mg | Freq: Four times a day (QID) | RECTAL | Status: DC | PRN

## 2021-12-30 MED ORDER — SODIUM CHLORIDE 0.9% FLUSH
3.0000 mL | Freq: Two times a day (BID) | INTRAVENOUS | Status: DC
  Administered 2021-12-30 – 2021-12-31 (×2): 3 mL via INTRAVENOUS

## 2021-12-30 MED ORDER — HYDROXYZINE HCL 25 MG PO TABS
25.0000 mg | ORAL_TABLET | Freq: Three times a day (TID) | ORAL | Status: DC | PRN
  Filled 2021-12-30: qty 1

## 2021-12-30 MED ORDER — ATORVASTATIN CALCIUM 40 MG PO TABS
40.0000 mg | ORAL_TABLET | Freq: Every day | ORAL | Status: DC
  Administered 2021-12-30 – 2021-12-31 (×2): 40 mg via ORAL
  Filled 2021-12-30 (×2): qty 1

## 2021-12-30 MED ORDER — ASPIRIN 81 MG PO TBEC
81.0000 mg | DELAYED_RELEASE_TABLET | Freq: Every day | ORAL | Status: DC
  Administered 2021-12-30 – 2021-12-31 (×2): 81 mg via ORAL
  Filled 2021-12-30 (×2): qty 1

## 2021-12-30 NOTE — ED Notes (Signed)
Pt is now awake and talking. Alert and oriented x 4. Unable to provide UA. States he rarely makes UA due to dialysis status. Will continue to monitor.

## 2021-12-30 NOTE — ED Notes (Signed)
Bair hugger removed at this time 

## 2021-12-30 NOTE — Progress Notes (Signed)
Pharmacy Antibiotic Note  Shawn Collins is a 71 y.o. male for which pharmacy has been consulted for vancomycin dosing for pneumonia.  Patient with a history of COPD, T2DM, HTN, NSTEMI, ESRD on HD MWF, CAD, recent admission for stroke for which TNK was administered. Patient presenting from rehab facility with hypoglycemia and decreased responsiveness.  WBC 8.9; LA 1; T 91.7 F; HR 49; RR 15  Plan: Vancomycin 1250 mg once - subsequent dosing per HD schedule. Would plan for 500mg  post HD. Cefepime per MD one time dose ordered by ED provider Trend WBC, Fever, Renal function, & Clinical course F/u cultures, clinical course, WBC, fever De-escalate when able F/u Nephrology plan F/u MRSA PCR     Temp (24hrs), Avg:91.7 F (33.2 C), Min:91.7 F (33.2 C), Max:91.7 F (33.2 C)  Recent Labs  Lab 12/24/21 0358 12/27/21 1712 12/30/21 0717  WBC 9.2  --  8.9  CREATININE 8.16* 10.29*  --   LATICACIDVEN  --   --  1.0    Estimated Creatinine Clearance: 5.2 mL/min (A) (by C-G formula based on SCr of 10.29 mg/dL (H)).    No Known Allergies  Antimicrobials this admission: Cefepime x 1 ordered by ED MD 8/3 vancomycin 8/3 >>   Microbiology results: Pending  Thank you for allowing pharmacy to be a part of this patient's care.  Lorelei Pont, PharmD, BCPS 12/30/2021 8:25 AM ED Clinical Pharmacist -  [phone]

## 2021-12-30 NOTE — Progress Notes (Addendum)
[facility] Progress Note   Subjective:   Patient recent discharged following [facility] admit 7/22 - 12/28/21 for stroke.   Presented to the ED from [facility] due to AMS and hypoglycemia.  Blood sugar has now improved.  Patient seen and examined at bedside in ED.  Reports he could feel himself "going out" at the SNF and was aware of his surroundings but unable to accurately respond.  Now feeling much better.  States "everyone took such good care of me." Only complaint is swelling in L arm.  Denies CP, SOB, abdominal pain and n/v/d.  Completed full dialysis yesterday without incident.    Pertinent findings in work up include hypothermia, bradycardia, CXR with no active disease and CT head with no acute changes.  Admitted to observation.   Objective Vitals:   12/30/21 1230 12/30/21 1300 12/30/21 1330 12/30/21 1342  BP: (!) 114/59 (!) 121/57 127/61   Pulse: 66 67 70   Resp: 12 17 15    Temp:    (!) 97.2 F (36.2 C)  TempSrc:    Rectal  SpO2: 100% 99% 99%    Physical Exam General:well appearing, thin male in NAD Heart:RRR, no mrg Lungs:CTAB, nml WOB on RA Abdomen:soft, NTND Extremities:no LE edema, LUE 1-2+ edema Dialysis Access: LU AVG maturing +b/t, TDC   There were no vitals filed for this visit.  Intake/Output Summary (Last 24 hours) at 12/30/2021 1538 Last data filed at 12/30/2021 1017 Gross per 24 hour  Intake 350 ml  Output --  Net 350 ml    Additional Objective Labs: Basic Metabolic Panel: Recent Labs  Lab 12/24/21 0358 12/27/21 1712 12/30/21 0717  NA 135 131* 138  K 4.9 4.8 3.9  CL 95* 93* 100  CO2 23 26 26   GLUCOSE 83 165* 110*  BUN 44* 63* 27*  CREATININE 8.16* 10.29* 5.53*  CALCIUM 8.4* 7.4* 8.9  PHOS  --  5.5*  --    Liver Function Tests: Recent Labs  Lab 12/27/21 1712 12/30/21 0717  AST  --  42*  ALT  --  20  ALKPHOS  --  51  BILITOT  --  0.6  PROT  --  6.3*  ALBUMIN 2.7* 3.1*   CBC: Recent Labs  Lab 12/24/21 0358 12/30/21 0717   WBC 9.2 8.9  NEUTROABS  --  7.5  HGB 13.2 11.6*  HCT 39.4 35.7*  MCV 94.9 98.3  PLT 265 295   Blood Culture    Component Value Date/Time   SDES BLOOD RIGHT FOREARM 12/30/2021 0717   SPECREQUEST  12/30/2021 0717    BOTTLES DRAWN AEROBIC AND ANAEROBIC Blood Culture adequate volume   CULT  12/30/2021 0717    NO GROWTH < 12 HOURS Performed at [facility], [place] [address] [place], [place] [address]    REPTSTATUS PENDING 12/30/2021 0717   CBG: Recent Labs  Lab 12/30/21 0908 12/30/21 1002 12/30/21 1103 12/30/21 1147 12/30/21 1310  GLUCAP 71 78 76 126* 189*    Studies/Results: CT Head Wo Contrast  Result Date: 12/30/2021 CLINICAL DATA:  Delirium. EXAM: CT HEAD WITHOUT CONTRAST TECHNIQUE: Contiguous axial images were obtained from the base of the skull through the vertex without intravenous contrast. RADIATION DOSE REDUCTION: This exam was performed according to the departmental dose-optimization program which includes automated exposure control, adjustment of the mA and/or kV according to patient size and/or use of iterative reconstruction technique. COMPARISON:  Brain MRI 12/23/2021. FINDINGS: Brain: Moderate generalized cerebral atrophy. Comparatively mild cerebellar atrophy. Known  early subacute infarct within the right middle cerebellar peduncle, better appreciated on the prior brain MRI of 12/23/2021. Moderate patchy and ill-defined hypoattenuation within the cerebral white matter, nonspecific but compatible chronic small vessel ischemic disease. Known chronic small-vessel infarct within the left corona radiata/capsule and left basal ganglia, better appreciated on the prior MRI. There is no acute intracranial hemorrhage. No demarcated cortical infarct. No extra-axial fluid collection. No evidence of an intracranial mass. No midline shift. Vascular: No hyperdense vessel.  Atherosclerotic calcifications. Skull: No fracture or aggressive osseous lesion. Sinuses/Orbits: No mass or  acute finding within the imaged orbits. Minimal mucosal thickening within the bilateral maxillary sinuses at the imaged levels. IMPRESSION: Known early subacute infarct within the right middle cerebellar peduncle, better appreciated on the prior brain MRI of 12/23/2021 (acute at that time). No CT evidence of interval acute intracranial abnormality. Background parenchymal atrophy and chronic small vessel ischemic disease, as described and not appreciably changed. Electronically Signed   By: Kellie Simmering D.O.   On: 12/30/2021 08:24   DG Chest Port 1 View  Result Date: 12/30/2021 CLINICAL DATA:  Questionable sepsis EXAM: PORTABLE CHEST 1 VIEW COMPARISON:  12/18/2021 FINDINGS: Perma catheter with tip at the upper cavoatrial junction. Normal heart size and mediastinal contours. Prior CABG. There is no edema, consolidation, effusion, or pneumothorax. Artifact from EKG leads. IMPRESSION: No active disease. Electronically Signed   By: Jorje Guild M.D.   On: 12/30/2021 07:41    Medications:   amLODipine  5 mg Oral QHS   aspirin EC  81 mg Oral Daily   atorvastatin  40 mg Oral Daily   carvedilol  12.5 mg Oral BID WC   clopidogrel  75 mg Oral Daily   heparin  5,000 Units Subcutaneous Q8H   hydrALAZINE  50 mg Oral TID   insulin aspart  0-6 Units Subcutaneous TID WC   losartan  25 mg Oral Daily   sodium chloride flush  3 mL Intravenous Q12H   sucroferric oxyhydroxide  500 mg Oral TID    Dialysis Orders: [facility] MWF 4h  400/800   54kg  2/2.5 bath TDC  Hep 8000 Hectorol 26mcg qHD  Assessment/Plan: 1. Hypoglycemia - resolved. 2. AMS - likey 2/2 #1. CT head with no acute findings.  3. Hypothermia - resolved when #1 resolved.  4. LUE edema - new AVG in LUE placed 7/25. Advised to elevated.  5. ESRD - on HD MWF.  Plan for HD tomorrow per regular schedule.  1st shift to help accommodate potential d/c.   6. Anemia of CKD- Hgb 11.6. No indication for ESA. 7. Secondary hyperparathyroidism - Ca in goal. Check  phos. Continue binders and VDRA. 8. HTN/volume - Blood pressure well controlled.  Continue home meds.  Does not appear volume overloaded, UF as tolerated.  9. Nutrition - Renal diet w/fluid restrictions. 10. Hx CVA - treated with TNK, CT head with no acute findings 11. Dispo - DNR.    Jen Mow, PA-C [facility] 12/30/2021,3:38 PM  LOS: 0 days    Patient seen by PA above.  Obs status and expected for discharge tomorrow.  Agree with note as above. Not personally seen by me.  No charge   Claudia Desanctis, MD 7:27 PM 12/30/2021

## 2021-12-30 NOTE — Inpatient Diabetes Management (Addendum)
Inpatient Diabetes Program Recommendations  AACE/ADA: New Consensus Statement on Inpatient Glycemic Control (2015)  Target Ranges:  Prepandial:   less than 140 mg/dL      Peak postprandial:   less than 180 mg/dL (1-2 hours)      Critically ill patients:  140 - 180 mg/dL   Lab Results  Component Value Date   GLUCAP 126 (H) 12/30/2021   HGBA1C 5.2 12/18/2021    Review of Glycemic Control  Latest Reference Range & Units 12/30/21 08:10 12/30/21 09:08 12/30/21 10:02 12/30/21 11:03 12/30/21 11:47  Glucose-Capillary 70 - 99 mg/dL 106 (H) 71 78 76 126 (H)  (H): Data is abnormally high  Diabetes history: DM2 Outpatient Diabetes medications: Lantus 15 units QHS Current orders for Inpatient glycemic control: Novolog 0-6 units TID  Referral for uncontrolled DM.  From [facility].  CBG was 49 mg/dL.  A1C is 5.2%.  ESRD; HD MWF.  Agree with current regimen of very sensitive correction TID; will follow.  May need to reduce basal or change to PO DM medications at discharge.    Will continue to follow while inpatient.  Thank you, Reche Dixon, MSN, [place] Diabetes Coordinator Inpatient Diabetes Program [phone] (team pager from 8a-5p)

## 2021-12-30 NOTE — ED Notes (Signed)
Breakfast tray ordered 

## 2021-12-30 NOTE — ED Provider Notes (Signed)
[facility] EMERGENCY DEPARTMENT Provider Note   CSN: [id number] Arrival date & time: 12/30/21  2836     History  Chief Complaint  Patient presents with   Hypoglycemia   Vascular Access Problem    Shawn Collins is a 71 y.o. male.  Level 5 caveat for altered mental status.  Patient brought in from his rehab facility with hypoglycemia and decreased responsiveness this morning.  He is a diabetic.  He reportedly had a blood sugar of 49 was given glucagon IM for EMS.  On arrival he was found to be hypothermic with a temperature of 91.7.  He is not able to give any history.  He is obtunded and somnolent and arouses to pain only.  Blood sugar is now 111.  He is a diabetic.  Discharged in the hospital 2 days ago after prolonged admission for acute cerebellar infarct status post TNK.  Also has a history of dialysis Monday Wednesday and Friday, hypertension, hyperlipidemia, COPD. No known fever or recent illness.  Patient denies pain  The history is provided by the patient and the EMS personnel. The history is limited by the condition of the patient.  Hypoglycemia      Home Medications Prior to Admission medications   Medication Sig Start Date End Date Taking? Authorizing Provider  amLODipine (NORVASC) 5 MG tablet Take 1 tablet (5 mg total) by mouth at bedtime. 12/28/21   de Yolanda Manges, Cortney E, NP  aspirin EC 81 MG tablet Take 1 tablet (81 mg total) by mouth daily. Swallow whole. 12/29/21   de Yolanda Manges, Cortney E, NP  atorvastatin (LIPITOR) 40 MG tablet Take 1 tablet (40 mg total) by mouth daily. 12/29/21   de Yolanda Manges, Cortney E, NP  carvedilol (COREG) 12.5 MG tablet Take 1 tablet (12.5 mg total) by mouth 2 (two) times daily with a meal. 12/28/21   de Yolanda Manges, Cortney E, NP  clopidogrel (PLAVIX) 75 MG tablet Take 1 tablet (75 mg total) by mouth daily. 12/29/21   de Yolanda Manges, Cortney E, NP  doxercalciferol (HECTOROL) 4 MCG/2ML injection Inject 2 mLs (4 mcg total) into the vein every  Monday, Wednesday, and Friday with hemodialysis. 12/28/21   de Yolanda Manges, Cortney E, NP  furosemide (LASIX) 40 MG tablet Take 40 mg by mouth daily.    [provider]  hydrALAZINE (APRESOLINE) 50 MG tablet Take 1 tablet (50 mg total) by mouth 3 (three) times daily. 12/28/21   de Yolanda Manges, Cortney E, NP  insulin glargine (LANTUS SOLOSTAR) 100 UNIT/ML Solostar Pen Inject 15 Units into the skin at bedtime.    [provider]  losartan (COZAAR) 25 MG tablet Take 25 mg by mouth daily. 08/11/21   [provider]  nitroGLYCERIN (NITROSTAT) 0.4 MG SL tablet Place 0.4 mg under the tongue every 5 (five) minutes as needed for chest pain.    [provider]  VELPHORO 500 MG chewable tablet Chew 500 mg by mouth 3 (three) times daily. 06/04/21   [provider]      Allergies    Patient has no known allergies.    Review of Systems   Review of Systems  Unable to perform ROS: Mental status change    Physical Exam Updated Vital Signs BP (!) 133/58 (BP Location: Right Arm)   Pulse (!) 56   Temp (!) 91.7 F (33.2 C) (Rectal) Comment: Dr. Christy Gentles aware and patient was placed on bearhugger  Resp 18   SpO2 97%  Physical Exam Vitals and nursing note reviewed.  Constitutional:      General: He is not in acute distress.    Appearance: He is well-developed.     Comments: Obtunded. Responds to pain.  Oriented x 1 only.  HENT:     Head: Normocephalic and atraumatic.     Mouth/Throat:     Pharynx: No oropharyngeal exudate.  Eyes:     Conjunctiva/sclera: Conjunctivae normal.     Pupils: Pupils are equal, round, and reactive to light.  Neck:     Comments: No meningismus. Cardiovascular:     Rate and Rhythm: Normal rate and regular rhythm.     Heart sounds: Normal heart sounds. No murmur heard. Pulmonary:     Effort: Pulmonary effort is normal. No respiratory distress.     Breath sounds: Normal breath sounds.     Comments: Dialysis cath R chest Chest:     Chest  wall: No tenderness.  Abdominal:     Palpations: Abdomen is soft.     Tenderness: There is no abdominal tenderness. There is no guarding or rebound.  Musculoskeletal:        General: No tenderness. Normal range of motion.     Cervical back: Normal range of motion and neck supple.  Skin:    General: Skin is warm.  Neurological:     Mental Status: He is alert.     Motor: No abnormal muscle tone.     Comments: Oriented to person only.  Obtunded, responds to pain, follows some commands moves all extremities equally.  Psychiatric:        Behavior: Behavior normal.     ED Results / Procedures / Treatments   Labs (all labs ordered are listed, but only abnormal results are displayed) Labs Reviewed  COMPREHENSIVE METABOLIC PANEL - Abnormal; Notable for the following components:      Result Value   Glucose, Bld 110 (*)    BUN 27 (*)    Creatinine, Ser 5.53 (*)    Total Protein 6.3 (*)    Albumin 3.1 (*)    AST 42 (*)    GFR, Estimated 10 (*)    All other components within normal limits  CBC WITH DIFFERENTIAL/PLATELET - Abnormal; Notable for the following components:   RBC 3.63 (*)    Hemoglobin 11.6 (*)    HCT 35.7 (*)    All other components within normal limits  ACETAMINOPHEN LEVEL - Abnormal; Notable for the following components:   Acetaminophen (Tylenol), Serum <10 (*)    All other components within normal limits  SALICYLATE LEVEL - Abnormal; Notable for the following components:   Salicylate Lvl <1.9 (*)    All other components within normal limits  CBG MONITORING, ED - Abnormal; Notable for the following components:   Glucose-Capillary 111 (*)    All other components within normal limits  CBG MONITORING, ED - Abnormal; Notable for the following components:   Glucose-Capillary 106 (*)    All other components within normal limits  TROPONIN I (HIGH SENSITIVITY) - Abnormal; Notable for the following components:   Troponin I (High Sensitivity) 18 (*)    All other components  within normal limits  CULTURE, BLOOD (ROUTINE X 2)  CULTURE, BLOOD (ROUTINE X 2)  SURGICAL PCR SCREEN  LACTIC ACID, PLASMA  LACTIC ACID, PLASMA  PROTIME-INR  APTT  TSH  AMMONIA  ETHANOL  URINALYSIS, ROUTINE W REFLEX MICROSCOPIC  T4, FREE  CBG MONITORING, ED  CBG MONITORING, ED  CBG MONITORING, ED  EKG EKG Interpretation  Date/Time:  Thursday December 30 2021 07:23:27 EDT Ventricular Rate:  49 PR Interval:  164 QRS Duration: 116 QT Interval:  503 QTC Calculation: 455 R Axis:   73 Text Interpretation: Sinus bradycardia Nonspecific intraventricular conduction delay Borderline ST elevation, anterior leads No significant change was found Confirmed by Ezequiel Essex [id number]) on 12/30/2021 8:54:30 AM  Radiology CT Head Wo Contrast  Result Date: 12/30/2021 CLINICAL DATA:  Delirium. EXAM: CT HEAD WITHOUT CONTRAST TECHNIQUE: Contiguous axial images were obtained from the base of the skull through the vertex without intravenous contrast. RADIATION DOSE REDUCTION: This exam was performed according to the departmental dose-optimization program which includes automated exposure control, adjustment of the mA and/or kV according to patient size and/or use of iterative reconstruction technique. COMPARISON:  Brain MRI 12/23/2021. FINDINGS: Brain: Moderate generalized cerebral atrophy. Comparatively mild cerebellar atrophy. Known early subacute infarct within the right middle cerebellar peduncle, better appreciated on the prior brain MRI of 12/23/2021. Moderate patchy and ill-defined hypoattenuation within the cerebral white matter, nonspecific but compatible chronic small vessel ischemic disease. Known chronic small-vessel infarct within the left corona radiata/capsule and left basal ganglia, better appreciated on the prior MRI. There is no acute intracranial hemorrhage. No demarcated cortical infarct. No extra-axial fluid collection. No evidence of an intracranial mass. No midline shift. Vascular: No  hyperdense vessel.  Atherosclerotic calcifications. Skull: No fracture or aggressive osseous lesion. Sinuses/Orbits: No mass or acute finding within the imaged orbits. Minimal mucosal thickening within the bilateral maxillary sinuses at the imaged levels. IMPRESSION: Known early subacute infarct within the right middle cerebellar peduncle, better appreciated on the prior brain MRI of 12/23/2021 (acute at that time). No CT evidence of interval acute intracranial abnormality. Background parenchymal atrophy and chronic small vessel ischemic disease, as described and not appreciably changed. Electronically Signed   By: Kellie Simmering D.O.   On: 12/30/2021 08:24   DG Chest Port 1 View  Result Date: 12/30/2021 CLINICAL DATA:  Questionable sepsis EXAM: PORTABLE CHEST 1 VIEW COMPARISON:  12/18/2021 FINDINGS: Perma catheter with tip at the upper cavoatrial junction. Normal heart size and mediastinal contours. Prior CABG. There is no edema, consolidation, effusion, or pneumothorax. Artifact from EKG leads. IMPRESSION: No active disease. Electronically Signed   By: Jorje Guild M.D.   On: 12/30/2021 07:41    Procedures .Critical Care  Performed by: Ezequiel Essex, MD Authorized by: Ezequiel Essex, MD   Critical care provider statement:    Critical care time (minutes):  35   Critical care time was exclusive of:  Separately billable procedures and treating other patients   Critical care was necessary to treat or prevent imminent or life-threatening deterioration of the following conditions: Hypoglycemia, hypothermia.   Critical care was time spent personally by me on the following activities:  Development of treatment plan with patient or surrogate, discussions with consultants, evaluation of patient's response to treatment, examination of patient, ordering and review of laboratory studies, ordering and review of radiographic studies, ordering and performing treatments and interventions, pulse oximetry,  re-evaluation of patient's condition and review of old charts   I assumed direction of critical care for this patient from another provider in my specialty: no     Care discussed with: admitting provider       Medications Ordered in ED Medications  naloxone (NARCAN) injection 0.4 mg (has no administration in time range)    ED Course/ Medical Decision Making/ A&P  Medical Decision Making Amount and/or Complexity of Data Reviewed Labs: ordered. Decision-making details documented in ED Course. Radiology: ordered and independent interpretation performed. Decision-making details documented in ED Course. ECG/medicine tests: ordered and independent interpretation performed. Decision-making details documented in ED Course.  Risk Prescription drug management. Decision regarding hospitalization.  Altered mental status with hypoglycemia living facility blood sugar now normal but hypothermic and slow to respond here.  Bair hugger placed.  Septic work-up pursued.  Patient given IV antibiotics after cultures are obtained.  Unclear source of his hypothermia and hypoglycemia.  Blood sugars have improved but his mentation is still poor and he is slow to respond.  Did respond briefly to Narcan though no opiates on medication list.  Heart rate in the 40s and 50s.  No acute ST changes.  CT head is stable and shows evolving infarct from his recent admission.  No new hemorrhage.  Results reviewed and interpreted by me.  Chest x-ray negative for infiltrate. TSH is normal.  Status improving.  Patient does not recall what happened this morning believes he passed out.  He thought he was still at his nursing home.  Denies any trauma or injury or pain. Blood sugars appear to have stabilized without need for further dextrose.  Suspect his mentation is delayed likely secondary to prolonged hypoglycemia.  Unclear etiology of his hypothermia but he was treated with antibiotics for  possibility of occult sepsis.  TSH is normal arguing against hypothermia or myxedema coma.  Admission for further observation discussed with Dr. Lorin Mercy.        Final Clinical Impression(s) / ED Diagnoses Final diagnoses:  Hypoglycemia  Hypothermia, initial encounter    Rx / DC Orders ED Discharge Orders     None         Teneka Malmberg, Annie Main, MD 12/30/21 [id number]

## 2021-12-30 NOTE — ED Notes (Signed)
Pt ate 100% of breakfast tray. Will continue to monitor.

## 2021-12-30 NOTE — H&P (Signed)
History and Physical    Patient: Shawn Collins:[id number] DOB: May 15, 1951 DOA: 12/30/2021 DOS: the patient was seen and examined on 12/30/2021 PCP: Nolene Ebbs, MD  Patient coming from: SNF - [facility]; NOK: Niece, Hardie Pulley, [phone]   Chief Complaint: hypoglycemia  HPI: Shawn Collins is a 71 y.o. male with medical history significant of ESRD on MWF HD; CAD s/p CABG; COPD; DM; HTN; and HLD presenting with hypoglycemia.  He was previously hospitalized from 7/22-8/1 for acute CVA s/p TNK.  While hospitalized he underwent graft placement by Dr. Scot Dock on 7/25, reports this is healing well.  He reports that he was previously living in a rooming house before his last admission.  He isn't sure how much insulin he is getting at [facility].  He currently feels ok and without complaint.  He was generally appropriate and able to provide history.   He was still on the [facility] but no longer hypothermic shortly thereafter.    ER Course:  Hypoglycemia at facility.  T91 rectal, unresponsive.  Given Narcan but not apparently on opiates.  Somewhat confused but no further hypoglycemia.  Glucose 71.  ?prolonged hypoglycemic event.  Given antibiotics for ?occult sepsis.  Due for HD today.     Review of Systems: As mentioned in the history of present illness. All other systems reviewed and are negative. Past Medical History:  Diagnosis Date   CAD (coronary artery disease), native coronary artery    s/p NSTEMI with cath showing severe 3VD s/p  CABG x 3 with LIMA to LAD, SVG to OM1/OM2 and SVG D1 by Dr. Servando Snare on 10/26/16.   COPD (chronic obstructive pulmonary disease) ([place]) 09/10/2014   CVA (cerebral vascular accident) ([place]) 12/18/2021   Diabetes mellitus without complication ([place])    Type II   ESRD (end stage renal disease) ([place])    Hyperlipidemia LDL goal <70 04/17/2017   Hypertension    Past Surgical History:  Procedure Laterality Date   AV FISTULA PLACEMENT Left 12/21/2021    Procedure: INSERTION OF LEFT ARM ARTERIOVENOUS (AV) GORE-TEX GRAFT;  Surgeon: Angelia Mould, MD;  Location: [facility] OR;  Service: Vascular;  Laterality: Left;   CORONARY ARTERY BYPASS GRAFT N/A 10/26/2016   Procedure: CORONARY ARTERY BYPASS GRAFTING (CABG) x 4 USING LEFT INTERNAL MAMMARY ARTERY TO LAD AND ENDOSCOPICALLY HARVESTED GREATER SAPHENOUS VEIN TO OM 1, 2 AND TO DIAG;  Surgeon: Grace Isaac, MD;  Location: [place];  Service: Open Heart Surgery;  Laterality: N/A;   ENDOVEIN HARVEST OF GREATER SAPHENOUS VEIN Right 10/26/2016   Procedure: ENDOVEIN HARVEST OF GREATER SAPHENOUS VEIN;  Surgeon: Grace Isaac, MD;  Location: [place];  Service: Open Heart Surgery;  Laterality: Right;   IR THORACENTESIS ASP PLEURAL SPACE W/IMG GUIDE  11/03/2016   LEFT HEART CATH AND CORONARY ANGIOGRAPHY N/A 10/21/2016   Procedure: Left Heart Cath and Coronary Angiography;  Surgeon: Martinique, Peter M, MD;  Location: [place] CV [facility];  Service: Cardiovascular;  Laterality: N/A;   TEE WITHOUT CARDIOVERSION N/A 10/26/2016   Procedure: TRANSESOPHAGEAL ECHOCARDIOGRAM (TEE);  Surgeon: Grace Isaac, MD;  Location: [place];  Service: Open Heart Surgery;  Laterality: N/A;   Social History:  reports that he quit smoking about 51 years ago. His smoking use included cigarettes. He has a 2.00 pack-year smoking history. He has never used smokeless tobacco. He reports that he does not currently use alcohol. He reports that he does not currently use drugs.  No Known Allergies  Family History  Problem Relation Age of Onset   Diabetes Mellitus II Mother    Stroke Father    Hypertension Father    Diabetes Mellitus II Sister     Prior to Admission medications   Medication Sig Start Date End Date Taking? Authorizing Provider  amLODipine (NORVASC) 5 MG tablet Take 1 tablet (5 mg total) by mouth at bedtime. 12/28/21   de Yolanda Manges, Cortney E, NP  aspirin EC 81 MG tablet Take 1 tablet (81 mg total) by mouth daily. Swallow  whole. 12/29/21   de Yolanda Manges, Cortney E, NP  atorvastatin (LIPITOR) 40 MG tablet Take 1 tablet (40 mg total) by mouth daily. 12/29/21   de Yolanda Manges, Cortney E, NP  carvedilol (COREG) 12.5 MG tablet Take 1 tablet (12.5 mg total) by mouth 2 (two) times daily with a meal. 12/28/21   de Yolanda Manges, Cortney E, NP  clopidogrel (PLAVIX) 75 MG tablet Take 1 tablet (75 mg total) by mouth daily. 12/29/21   de Yolanda Manges, Cortney E, NP  doxercalciferol (HECTOROL) 4 MCG/2ML injection Inject 2 mLs (4 mcg total) into the vein every Monday, Wednesday, and Friday with hemodialysis. 12/28/21   de Yolanda Manges, Cortney E, NP  furosemide (LASIX) 40 MG tablet Take 40 mg by mouth daily.    [provider]  hydrALAZINE (APRESOLINE) 50 MG tablet Take 1 tablet (50 mg total) by mouth 3 (three) times daily. 12/28/21   de Yolanda Manges, Cortney E, NP  insulin glargine (LANTUS SOLOSTAR) 100 UNIT/ML Solostar Pen Inject 15 Units into the skin at bedtime.    [provider]  losartan (COZAAR) 25 MG tablet Take 25 mg by mouth daily. 08/11/21   [provider]  nitroGLYCERIN (NITROSTAT) 0.4 MG SL tablet Place 0.4 mg under the tongue every 5 (five) minutes as needed for chest pain.    [provider]  VELPHORO 500 MG chewable tablet Chew 500 mg by mouth 3 (three) times daily. 06/04/21   [provider]    Physical Exam: Vitals:   12/30/21 1230 12/30/21 1300 12/30/21 1330 12/30/21 1342  BP: (!) 114/59 (!) 121/57 127/61   Pulse: 66 67 70   Resp: 12 17 15    Temp:    (!) 97.2 F (36.2 C)  TempSrc:    Rectal  SpO2: 100% 99% 99%    General:  Appears calm and comfortable and is in NAD Eyes:  PERRL, EOMI, normal lids, iris ENT:  grossly normal hearing, lips & tongue, mmm Neck:  no LAD, masses or thyromegaly; TDC present on R Cardiovascular:  RR with mild bradycardia, no m/r/g. No LE edema.  Respiratory:   CTA bilaterally with no wheezes/rales/rhonchi.  Normal respiratory effort. Abdomen:  soft, NT, ND Skin:   no rash or induration seen on limited exam Musculoskeletal:  grossly normal tone BUE/BLE, good ROM, no bony abnormality Psychiatric:  grossly normal mood and affect, speech fluent and appropriate, AOx3 Neurologic:  CN 2-12 grossly intact, moves all extremities in coordinated fashion   Radiological Exams on Admission: Independently reviewed - see discussion in A/P where applicable  CT Head Wo Contrast  Result Date: 12/30/2021 CLINICAL DATA:  Delirium. EXAM: CT HEAD WITHOUT CONTRAST TECHNIQUE: Contiguous axial images were obtained from the base of the skull through the vertex without intravenous contrast. RADIATION DOSE REDUCTION: This exam was performed according to the departmental dose-optimization program which includes automated exposure control, adjustment of the mA and/or kV according to patient size and/or use of iterative reconstruction technique.  COMPARISON:  Brain MRI 12/23/2021. FINDINGS: Brain: Moderate generalized cerebral atrophy. Comparatively mild cerebellar atrophy. Known early subacute infarct within the right middle cerebellar peduncle, better appreciated on the prior brain MRI of 12/23/2021. Moderate patchy and ill-defined hypoattenuation within the cerebral white matter, nonspecific but compatible chronic small vessel ischemic disease. Known chronic small-vessel infarct within the left corona radiata/capsule and left basal ganglia, better appreciated on the prior MRI. There is no acute intracranial hemorrhage. No demarcated cortical infarct. No extra-axial fluid collection. No evidence of an intracranial mass. No midline shift. Vascular: No hyperdense vessel.  Atherosclerotic calcifications. Skull: No fracture or aggressive osseous lesion. Sinuses/Orbits: No mass or acute finding within the imaged orbits. Minimal mucosal thickening within the bilateral maxillary sinuses at the imaged levels. IMPRESSION: Known early subacute infarct within the right middle cerebellar peduncle, better  appreciated on the prior brain MRI of 12/23/2021 (acute at that time). No CT evidence of interval acute intracranial abnormality. Background parenchymal atrophy and chronic small vessel ischemic disease, as described and not appreciably changed. Electronically Signed   By: Kellie Simmering D.O.   On: 12/30/2021 08:24   DG Chest Port 1 View  Result Date: 12/30/2021 CLINICAL DATA:  Questionable sepsis EXAM: PORTABLE CHEST 1 VIEW COMPARISON:  12/18/2021 FINDINGS: Perma catheter with tip at the upper cavoatrial junction. Normal heart size and mediastinal contours. Prior CABG. There is no edema, consolidation, effusion, or pneumothorax. Artifact from EKG leads. IMPRESSION: No active disease. Electronically Signed   By: Jorje Guild M.D.   On: 12/30/2021 07:41    EKG: Independently reviewed.  Sinus bradycardia with rate 49; IVCD; nonspecific ST changes with no evidence of acute ischemia   Labs on Admission: I have personally reviewed the available labs and imaging studies at the time of the admission.  Pertinent labs:    Glucose 78, 110, 106, 71 BUN 27/Creatinine 5.53/GFR 10 Albumin 3.1 AST 42/ALT 20 Lactate 1, 0.9 WBC 8.9 Hgb 11.6 ETOH <10 APAP <7 TSH 1.563   Assessment and Plan: Principal Problem:   Acute metabolic encephalopathy due to hypoglycemia Active Problems:   Hypertension   S/P CABG x 4   Hyperlipidemia LDL goal <70   ESRD (end stage renal disease) on dialysis ([place])   Hypothermia   Uncontrolled type 2 diabetes mellitus with hypoglycemia, with long-term current use of insulin (H[facility])   History of CVA (cerebrovascular accident)   DNR (do not resuscitate)    Hypoglycemia in the setting of T2DM -Patient with known h/o DM, on glargine -Presented by hypoglycemia as well as AMS and hypothermia -Suspect that he had a prolonged episode of hypoglycemia leading to other symptoms -His glucose has normalized and so have his temp and MS -Will observe overnight -Given improvement in  the ER, will downgrade from progressive to telemetry -Will hold basal insulin for now and cover with very sensitive scale SSI to determine amount that he is likely to need at the time of dc  Hypothermia -Resolved after hypoglycemia resolved -Low suspicion for sepsis, will not continue abx -No longer needs a Bair hugger  AMS -As above, suspect that this was related to profound and likely prolonged hypoglycemic event -He appears to be back to his baseline at this time  H/o CVA -Recent admission for CVA, treated with TNK -Continue ASA, Plavix for 3 weeks (through 8/12) and then transition to Plavix monotherapy -In SNF for rehab, ?long-term need  HTN -Continue amlodipine, carvedilol, hydralazine, losartan  ESRD on HD -Patient on chronic MWF HD -Nephrology prn  order set utilized -He does not appear to be volume overloaded or otherwise in need of acute HD but is due today -Nephrology (Dr. Royce Macadamia) was notified that patient will need HD  -Continue Velphoro, Hectorol with HD  CAD -s/p CABG -No report of CP currently  HLD -Continue atorvastatin  DNR -I have discussed code status with the patient and he would prefer to die a natural death should that situation arise. -He will need a gold out of facility DNR form at the time of discharge     Advance Care Planning:   Code Status: DNR   Consults: Nephrology; DM coordinator; [facility] team  DVT Prophylaxis: Heparin  Family Communication: None present; I spoke with his niece by telephone at the time of admission  Severity of Illness: The appropriate patient status for this patient is OBSERVATION. Observation status is judged to be reasonable and necessary in order to provide the required intensity of service to ensure the patient's safety. The patient's presenting symptoms, physical exam findings, and initial radiographic and laboratory data in the context of their medical condition is felt to place them at decreased risk for further  clinical deterioration. Furthermore, it is anticipated that the patient will be medically stable for discharge from the hospital within 2 midnights of admission.   Author: Karmen Bongo, MD 12/30/2021 3:27 PM  For on call review [email or web address].

## 2021-12-30 NOTE — ED Triage Notes (Addendum)
Patient presents to ed via  [facility] states he is from [facility]. Patient is currently alert oriented  states he could feel himself going out. Upon ems arrival patient BS was 58 gave 1mg  Glucagon IM  BS came up to 6 Patient states he is a M-W-F dialysis, has a tunneled cath right upper chest.

## 2021-12-30 NOTE — ED Provider Triage Note (Signed)
Emergency Medicine Provider Triage Evaluation Note  Shawn Collins , a 71 y.o. male  was evaluated in triage.  Pt complains of hypolgycemia.  Review of Systems  Positive: chills Negative: Chest pain  Physical Exam  Pt is hypothermic to 91 Gen:   Awake, no distress   Resp:  Normal effort  MSK:   Moves extremities without difficulty  Other:    Medical Decision Making  Medically screening exam initiated at 6:40 AM.  Appropriate orders placed.  Shawn Collins was informed that the remainder of the evaluation will be completed by another provider, this initial triage assessment does not replace that evaluation, and the importance of remaining in the ED until their evaluation is complete.  Bair hugger applied, but pt was hypoglycemic earlier.  He is awake/alert now   Ripley Fraise, MD 12/30/21 [phone]

## 2021-12-30 NOTE — ED Notes (Signed)
CBG 71. MD notified at this time.

## 2021-12-30 NOTE — ED Notes (Signed)
Pt is requesting food at this time. MD notified as pt is currently on NPO status. Pt is awake, alert and oriented x 4. Talking and able to follow commands. Will continue to monitor.

## 2021-12-30 NOTE — ED Notes (Signed)
MD notified of pt CBG. Cleared pt to eat at this time. Pt remains awake, alert and oriented. Will continue to monitor.

## 2021-12-31 DIAGNOSIS — E162 Hypoglycemia, unspecified: Secondary | ICD-10-CM | POA: Diagnosis not present

## 2021-12-31 DIAGNOSIS — G9341 Metabolic encephalopathy: Secondary | ICD-10-CM | POA: Diagnosis not present

## 2021-12-31 LAB — RENAL FUNCTION PANEL
Albumin: 2.6 g/dL — ABNORMAL LOW (ref 3.5–5.0)
Anion gap: 11 (ref 5–15)
BUN: 47 mg/dL — ABNORMAL HIGH (ref 8–23)
CO2: 24 mmol/L (ref 22–32)
Calcium: 8.2 mg/dL — ABNORMAL LOW (ref 8.9–10.3)
Chloride: 100 mmol/L (ref 98–111)
Creatinine, Ser: 7.83 mg/dL — ABNORMAL HIGH (ref 0.61–1.24)
GFR, Estimated: 7 mL/min — ABNORMAL LOW (ref >60)
Glucose, Bld: 204 mg/dL — ABNORMAL HIGH (ref 70–99)
Phosphorus: 5.1 mg/dL — ABNORMAL HIGH (ref 2.5–4.6)
Potassium: 4.1 mmol/L (ref 3.5–5.1)
Sodium: 135 mmol/L (ref 135–145)

## 2021-12-31 LAB — CBC
HCT: 29.5 % — ABNORMAL LOW (ref 39.0–52.0)
Hemoglobin: 9.9 g/dL — ABNORMAL LOW (ref 13.0–17.0)
MCH: 32.7 pg (ref 26.0–34.0)
MCHC: 33.6 g/dL (ref 30.0–36.0)
MCV: 97.4 fL (ref 80.0–100.0)
Platelets: 306 10*3/uL (ref 150–400)
RBC: 3.03 MIL/uL — ABNORMAL LOW (ref 4.22–5.81)
RDW: 13.1 % (ref 11.5–15.5)
WBC: 7.6 10*3/uL (ref 4.0–10.5)
nRBC: 0 % (ref 0.0–0.2)

## 2021-12-31 LAB — GLUCOSE, CAPILLARY
Glucose-Capillary: 154 mg/dL — ABNORMAL HIGH (ref 70–99)
Glucose-Capillary: 98 mg/dL (ref 70–99)

## 2021-12-31 LAB — HEPATITIS B SURFACE ANTIBODY, QUANTITATIVE: Hep B S AB Quant (Post): <3.1 m[IU]/mL — ABNORMAL LOW (ref >9.9)

## 2021-12-31 MED ORDER — LIDOCAINE HCL (PF) 1 % IJ SOLN: 5.0000 mL | INTRAMUSCULAR | Status: DC | PRN

## 2021-12-31 MED ORDER — HEPARIN SODIUM (PORCINE) 1000 UNIT/ML DIALYSIS
1000.0000 [IU] | INTRAMUSCULAR | Status: DC | PRN
  Administered 2021-12-31: 1000 [IU]

## 2021-12-31 MED ORDER — PENTAFLUOROPROP-TETRAFLUOROETH EX AERO: 1.0000 | INHALATION_SPRAY | CUTANEOUS | Status: DC | PRN

## 2021-12-31 MED ORDER — HEPARIN SODIUM (PORCINE) 1000 UNIT/ML DIALYSIS
5000.0000 [IU] | INTRAMUSCULAR | Status: DC | PRN
  Administered 2021-12-31: 5000 [IU] via INTRAVENOUS_CENTRAL
  Filled 2021-12-31 (×3): qty 5

## 2021-12-31 MED ORDER — ALTEPLASE 2 MG IJ SOLR: 2.0000 mg | Freq: Once | INTRAMUSCULAR | Status: DC | PRN

## 2021-12-31 MED ORDER — ANTICOAGULANT SODIUM CITRATE 4% (200MG/5ML) IV SOLN
5.0000 mL | Status: DC | PRN
Start: 2021-12-31 — End: 2021-12-31

## 2021-12-31 MED ORDER — LIDOCAINE-PRILOCAINE 2.5-2.5 % EX CREA
1.0000 | TOPICAL_CREAM | CUTANEOUS | Status: DC | PRN
Start: 2021-12-31 — End: 2021-12-31

## 2021-12-31 NOTE — TOC Transition Note (Signed)
Transition of Care [facility]) - CM/SW Discharge Note   Patient Details  Name: Shawn Collins MRN: [id number] Date of Birth: 31-Jan-1951  Transition of Care [facility]) CM/SW Contact:  Milinda Antis, [place] Phone Number: 12/31/2021, 12:17 PM   Clinical Narrative:    Patient will DC to:  [facility] Anticipated DC date: 12/31/2021 Transport by: Corey Harold   Per MD patient ready for DC to SNF. RN to call report prior to discharge [phone] room 108 ask for [facility]). RN, patient, and facility notified of DC. Discharge Summary and FL2 sent to facility. DC packet on chart. Ambulance transport will be requested for patient.   CSW will sign off for now as social work intervention is no longer needed. Please consult [place] again if new needs arise.     Final next level of care: Skilled Nursing Facility Barriers to Discharge: No Barriers Identified   Patient Goals and CMS Choice        Discharge Placement              Patient chooses bed at:  [facility]) Patient to be transferred to facility by: [place] Name of family member notified: self Patient and family notified of of transfer: 12/31/21  Discharge Plan and Services                                     Social Determinants of Health (SDOH) Interventions     Readmission Risk Interventions     No data to display

## 2021-12-31 NOTE — Progress Notes (Addendum)
[facility] Progress Note   Subjective:  Seen on HD - 2L UFG and tolerating. Denies CP/dyspnea. Speech coherent, appears back to baseline. No new symptoms.  Objective Vitals:   12/31/21 0500 12/31/21 0800 12/31/21 0827 12/31/21 0828  BP: 125/67 (!) 118/50 (!) 120/58   Pulse: 67 61 60   Resp: 16 13 18    Temp: 98 F (36.7 C) 98.9 F (37.2 C)    TempSrc: Oral Oral    SpO2: 97% 99% 99%   Weight:  57 kg  56 kg   Physical Exam General: Well appearing man, NAD. Room air Heart: RRR; no murmur Lungs: CTAB Abdomen: soft Extremities: No LE edema Dialysis Access: maturing LUE AVG + TDC in R chest  Additional Objective Labs: Basic Metabolic Panel: Recent Labs  Lab 12/27/21 1712 12/30/21 0717  NA 131* 138  K 4.8 3.9  CL 93* 100  CO2 26 26  GLUCOSE 165* 110*  BUN 63* 27*  CREATININE 10.29* 5.53*  CALCIUM 7.4* 8.9  PHOS 5.5*  --    Liver Function Tests: Recent Labs  Lab 12/27/21 1712 12/30/21 0717  AST  --  42*  ALT  --  20  ALKPHOS  --  51  BILITOT  --  0.6  PROT  --  6.3*  ALBUMIN 2.7* 3.1*   CBC: Recent Labs  Lab 12/30/21 0717  WBC 8.9  NEUTROABS 7.5  HGB 11.6*  HCT 35.7*  MCV 98.3  PLT 295   Blood Culture    Component Value Date/Time   SDES BLOOD RIGHT FOREARM 12/30/2021 0717   SPECREQUEST  12/30/2021 0717    BOTTLES DRAWN AEROBIC AND ANAEROBIC Blood Culture adequate volume   CULT  12/30/2021 0717    NO GROWTH < 12 HOURS Performed at [facility], [place] [address], [place], [place] [address]    REPTSTATUS PENDING 12/30/2021 0354   Studies/Results: CT Head Wo Contrast  Result Date: 12/30/2021 CLINICAL DATA:  Delirium. EXAM: CT HEAD WITHOUT CONTRAST TECHNIQUE: Contiguous axial images were obtained from the base of the skull through the vertex without intravenous contrast. RADIATION DOSE REDUCTION: This exam was performed according to the departmental dose-optimization program which includes automated exposure control, adjustment  of the mA and/or kV according to patient size and/or use of iterative reconstruction technique. COMPARISON:  Brain MRI 12/23/2021. FINDINGS: Brain: Moderate generalized cerebral atrophy. Comparatively mild cerebellar atrophy. Known early subacute infarct within the right middle cerebellar peduncle, better appreciated on the prior brain MRI of 12/23/2021. Moderate patchy and ill-defined hypoattenuation within the cerebral white matter, nonspecific but compatible chronic small vessel ischemic disease. Known chronic small-vessel infarct within the left corona radiata/capsule and left basal ganglia, better appreciated on the prior MRI. There is no acute intracranial hemorrhage. No demarcated cortical infarct. No extra-axial fluid collection. No evidence of an intracranial mass. No midline shift. Vascular: No hyperdense vessel.  Atherosclerotic calcifications. Skull: No fracture or aggressive osseous lesion. Sinuses/Orbits: No mass or acute finding within the imaged orbits. Minimal mucosal thickening within the bilateral maxillary sinuses at the imaged levels. IMPRESSION: Known early subacute infarct within the right middle cerebellar peduncle, better appreciated on the prior brain MRI of 12/23/2021 (acute at that time). No CT evidence of interval acute intracranial abnormality. Background parenchymal atrophy and chronic small vessel ischemic disease, as described and not appreciably changed. Electronically Signed   By: Kellie Simmering D.O.   On: 12/30/2021 08:24   DG Chest Port 1 View  Result Date: 12/30/2021 CLINICAL DATA:  Questionable sepsis EXAM: PORTABLE CHEST 1 VIEW COMPARISON:  12/18/2021 FINDINGS: Perma catheter with tip at the upper cavoatrial junction. Normal heart size and mediastinal contours. Prior CABG. There is no edema, consolidation, effusion, or pneumothorax. Artifact from EKG leads. IMPRESSION: No active disease. Electronically Signed   By: Jorje Guild M.D.   On: 12/30/2021 07:41     Medications:  anticoagulant sodium citrate      amLODipine  5 mg Oral QHS   aspirin EC  81 mg Oral Daily   atorvastatin  40 mg Oral Daily   carvedilol  12.5 mg Oral BID WC   Chlorhexidine Gluconate Cloth  6 each Topical Q0600   clopidogrel  75 mg Oral Daily   doxercalciferol  4 mcg Intravenous Q M,W,F-HD   heparin  5,000 Units Subcutaneous Q8H   hydrALAZINE  50 mg Oral TID   insulin aspart  0-6 Units Subcutaneous TID WC   losartan  25 mg Oral Daily   sodium chloride flush  3 mL Intravenous Q12H   sucroferric oxyhydroxide  500 mg Oral TID    Dialysis Orders: [facility] MWF 4h  400/800   54kg  2/2.5 bath TDC  Hep 8000 - Hectorol 46mcg qHD   Assessment/Plan: 1. Hypoglycemia - resolved. 2. AMS - likely 2/2 #1. CT head with no acute findings.  3. Hypothermia - resolved when #1 resolved.  4. LUE edema - new AVG in LUE placed 7/25, elevation has been recommended. 5. ESRD - on HD MWF.  HD today. Likely ok for discharge after HD. 6. Anemia of CKD- Hgb 11.6. No indication for ESA. 7. Secondary hyperparathyroidism - Ca in goal. Check phos. Continue binders and VDRA. 8. HTN/volume - Blood pressure well controlled.  Continue home meds.  Does not appear volume overloaded, UF as tolerated.  9. Nutrition - Renal diet w/fluid restrictions. 10. Hx CVA   Veneta Penton, Hershal Coria 12/31/2021, 8:32 AM  [facility]  Nephrology attending: Seen and examined in HD unit. I agree with above.  Tolerating HD well. No c/o. Likely discharge after HD.   Katheran James,  CKA

## 2021-12-31 NOTE — Discharge Summary (Signed)
Physician Discharge Summary  Shawn Collins QJJ:[id number] DOB: 10-Dec-1950 DOA: 12/30/2021  PCP: Nolene Ebbs, MD  Admit date: 12/30/2021  Discharge date: 12/31/2021  Admitted From:SNF  Disposition:  SNF  Recommendations for Outpatient Follow-up:  Follow up with PCP in 1-2 weeks Discontinue further use of Lantus or any other hypoglycemic agents due to issues with hypoglycemia and hemoglobin A1c 5.5% Continue other home medications as prior  Home Health: None  Equipment/Devices: None  Discharge Condition:Stable  CODE STATUS: DNR  Diet recommendation: Renal diet  Brief/Interim Summary:  Shawn Collins is a 71 y.o. male with medical history significant of ESRD on MWF HD; CAD s/p CABG; COPD; DM; HTN; and HLD presenting with hypoglycemia.  He was previously hospitalized from 7/22-8/1 for acute CVA s/p TNK.  While hospitalized he underwent graft placement by Dr. Scot Dock on 7/25, reports this is healing well.  He was unresponsive at his facility and was hypothermic, but shortly after arrival and correction of his blood glucose levels was noted to be much more responsive and back to baseline and no longer hypothermic.  He was seen by nephrology and had his hemodialysis per routine schedule on the day of discharge and tolerated this well.  Blood glucose levels have remained stable.  Hemoglobin A1c noted to be 5.5% and he is recommended not to use any more Lantus in the outpatient setting.  He should have close follow-up with PCP to monitor blood glucose levels and hemoglobin A1c.  No other acute events noted and he is stable for discharge.  Discharge Diagnoses:  Principal Problem:   Acute metabolic encephalopathy due to hypoglycemia Active Problems:   Hypertension   S/P CABG x 4   Hyperlipidemia LDL goal <70   ESRD (end stage renal disease) on dialysis ([place])   Hypothermia   Uncontrolled type 2 diabetes mellitus with hypoglycemia, with long-term current use of insulin ([place])   History of CVA  (cerebrovascular accident)   DNR (do not resuscitate)  Principal discharge diagnosis: Acute metabolic encephalopathy with hypothermia due to hypoglycemia.  Discharge Instructions  Discharge Instructions     Diet - low sodium heart healthy   Complete by: As directed    Increase activity slowly   Complete by: As directed    No wound care   Complete by: As directed       Allergies as of 12/31/2021   No Known Allergies      Medication List     STOP taking these medications    Lantus SoloStar 100 UNIT/ML Solostar Pen Generic drug: insulin glargine       TAKE these medications    amLODipine 5 MG tablet Commonly known as: NORVASC Take 1 tablet (5 mg total) by mouth at bedtime.   aspirin EC 81 MG tablet Take 1 tablet (81 mg total) by mouth daily. Swallow whole. What changed: additional instructions   atorvastatin 40 MG tablet Commonly known as: LIPITOR Take 1 tablet (40 mg total) by mouth daily. What changed: when to take this   carvedilol 12.5 MG tablet Commonly known as: COREG Take 1 tablet (12.5 mg total) by mouth 2 (two) times daily with a meal.   clopidogrel 75 MG tablet Commonly known as: PLAVIX Take 1 tablet (75 mg total) by mouth daily.   doxercalciferol 4 MCG/2ML injection Commonly known as: HECTOROL Inject 2 mLs (4 mcg total) into the vein every Monday, Wednesday, and Friday with hemodialysis.   furosemide 40 MG tablet Commonly known as: LASIX Take 40 mg by mouth daily.  hydrALAZINE 50 MG tablet Commonly known as: APRESOLINE Take 1 tablet (50 mg total) by mouth 3 (three) times daily.   losartan 25 MG tablet Commonly known as: COZAAR Take 25 mg by mouth daily.   nitroGLYCERIN 0.4 MG SL tablet Commonly known as: NITROSTAT Place 0.4 mg under the tongue every 5 (five) minutes as needed for chest pain.   Velphoro 500 MG chewable tablet Generic drug: sucroferric oxyhydroxide Chew 500 mg by mouth 3 (three) times daily.        Follow-up  Information     Nolene Ebbs, MD. Schedule an appointment as soon as possible for a visit in 1 week(s).   Specialty: Internal Medicine Contact information: [address] [place] [address] [phone]                No Known Allergies  Consultations: Nephrology   Procedures/Studies: CT Head Wo Contrast  Result Date: 12/30/2021 CLINICAL DATA:  Delirium. EXAM: CT HEAD WITHOUT CONTRAST TECHNIQUE: Contiguous axial images were obtained from the base of the skull through the vertex without intravenous contrast. RADIATION DOSE REDUCTION: This exam was performed according to the departmental dose-optimization program which includes automated exposure control, adjustment of the mA and/or kV according to patient size and/or use of iterative reconstruction technique. COMPARISON:  Brain MRI 12/23/2021. FINDINGS: Brain: Moderate generalized cerebral atrophy. Comparatively mild cerebellar atrophy. Known early subacute infarct within the right middle cerebellar peduncle, better appreciated on the prior brain MRI of 12/23/2021. Moderate patchy and ill-defined hypoattenuation within the cerebral white matter, nonspecific but compatible chronic small vessel ischemic disease. Known chronic small-vessel infarct within the left corona radiata/capsule and left basal ganglia, better appreciated on the prior MRI. There is no acute intracranial hemorrhage. No demarcated cortical infarct. No extra-axial fluid collection. No evidence of an intracranial mass. No midline shift. Vascular: No hyperdense vessel.  Atherosclerotic calcifications. Skull: No fracture or aggressive osseous lesion. Sinuses/Orbits: No mass or acute finding within the imaged orbits. Minimal mucosal thickening within the bilateral maxillary sinuses at the imaged levels. IMPRESSION: Known early subacute infarct within the right middle cerebellar peduncle, better appreciated on the prior brain MRI of 12/23/2021 (acute at that time). No CT  evidence of interval acute intracranial abnormality. Background parenchymal atrophy and chronic small vessel ischemic disease, as described and not appreciably changed. Electronically Signed   By: Kellie Simmering D.O.   On: 12/30/2021 08:24   DG Chest Port 1 View  Result Date: 12/30/2021 CLINICAL DATA:  Questionable sepsis EXAM: PORTABLE CHEST 1 VIEW COMPARISON:  12/18/2021 FINDINGS: Perma catheter with tip at the upper cavoatrial junction. Normal heart size and mediastinal contours. Prior CABG. There is no edema, consolidation, effusion, or pneumothorax. Artifact from EKG leads. IMPRESSION: No active disease. Electronically Signed   By: Jorje Guild M.D.   On: 12/30/2021 07:41   MR BRAIN WO CONTRAST  Result Date: 12/23/2021 CLINICAL DATA:  Follow-up examination for stroke. EXAM: MRI HEAD WITHOUT CONTRAST TECHNIQUE: Multiplanar, multiecho pulse sequences of the brain and surrounding structures were obtained without intravenous contrast. COMPARISON:  Prior MRI from 12/19/2021. FINDINGS: Brain: Previously identified 6 mm acute ischemic infarct at the right cerebellar peduncle again seen, stable in size and appearance from prior. No associated hemorrhage or mass effect. Additional previously question subacute ischemic infarct at the posterior limb of the left internal capsule is not visualized on this exam. No other new areas of acute or interval infarction. Underlying atrophy with chronic small vessel ischemic disease again noted. Multiple remote lacunar infarcts  noted about the hemispheric cerebral white matter, deep gray nuclei, and left cerebellum. No acute intracranial hemorrhage. Few subcentimeter chronic micro hemorrhages involving the left cerebellum and right occipital lobe noted, stable. No mass lesion, mass effect, or midline shift. No hydrocephalus or extra-axial fluid collection. Pituitary gland and suprasellar region within normal limits. Vascular: Absent flow void within the left V4 segment,  stable, suspected to be occluded. Major intracranial vascular flow voids are otherwise maintained. Skull and upper cervical spine: Craniocervical junction within normal limits. Bone marrow signal intensity normal. Prominent degenerative spondylosis at C3-4 without high-grade spinal stenosis. No scalp soft tissue abnormality. Sinuses/Orbits: Globes and orbital soft tissues within normal limits. Mild mucosal thickening noted about the ethmoidal air cells and maxillary sinuses. Mastoid air cells are clear. Other: None. IMPRESSION: 1. Stable size and appearance of evolving 6 mm acute ischemic infarct at the right cerebellar peduncle. No associated hemorrhage or mass effect. 2. Additional previously questioned subacute ischemic infarct at the posterior limb of the left internal capsule is not visualized on this exam. 3. No other new infarct or other abnormality identified. 4. Absent flow void within the left V4 segment, consistent with occlusion, stable. 5. Underlying atrophy with chronic small vessel ischemic disease with multiple remote lacunar infarcts as above, stable. Electronically Signed   By: Jeannine Boga M.D.   On: 12/23/2021 04:11   CT ABDOMEN PELVIS W CONTRAST  Result Date: 12/22/2021 CLINICAL DATA:  Abdominal pain, acute, nonlocalized EXAM: CT ABDOMEN AND PELVIS WITH CONTRAST TECHNIQUE: Multidetector CT imaging of the abdomen and pelvis was performed using the standard protocol following bolus administration of intravenous contrast. RADIATION DOSE REDUCTION: This exam was performed according to the departmental dose-optimization program which includes automated exposure control, adjustment of the mA and/or kV according to patient size and/or use of iterative reconstruction technique. CONTRAST:  176mL OMNIPAQUE IOHEXOL 300 MG/ML  SOLN COMPARISON:  CT 10/16/2016 FINDINGS: Lower chest: No acute abnormality. Hepatobiliary: No focal liver abnormality is seen. There is some higher density material  within the gallbladder, likely combination of sludge and vicarious excretion of contrast from prior CT exam. No wall thickening or pericholecystic stranding. Pancreas: Unremarkable. No pancreatic ductal dilatation or surrounding inflammatory changes. Spleen: Normal in size without focal abnormality. Adrenals/Urinary Tract: Adrenal glands are unremarkable. Persistent nephrograms with no significant excretion of contrast on delay compatible with history of end-stage renal disease. There is retained contrast material in the bladder from prior contrast enhanced study. Stomach/Bowel: The stomach is within normal limits. There is no evidence of bowel obstruction. Moderate colonic stool burden.No evidence of appendicitis. Vascular/Lymphatic: Aortoiliac atherosclerosis. No AAA. No lymphadenopathy. Reproductive: Mildly enlarged prostate gland. Other: Trace free fluid in pelvis. 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 This is stable since at least May 2018, and on that exam was noted to be unchanged for at least 6 years and likely benign. Musculoskeletal: No acute osseous abnormality. No suspicious osseous lesion. Mild to moderate bilateral hip osteoarthritis. Multilevel degenerative changes of the spine. IMPRESSION: Moderate colonic stool burden suggesting constipation. No evidence of bowel obstruction or other acute findings in the abdomen or pelvis. Electronically Signed   By: Maurine Simmering M.D.   On: 12/22/2021 12:28   DG Abd Portable 1V  Result Date: 12/22/2021 CLINICAL DATA:  Nausea EXAM: PORTABLE ABDOMEN - 1 VIEW COMPARISON:  None Available. FINDINGS: No dilated large or small bowel. Moderate volume stool in the rectum. No  pathologic calcifications. No organomegaly IMPRESSION: 1. No bowel obstruction. 2. Moderate stool in the rectum. Electronically Signed   By: Suzy Bouchard M.D.   On: 12/22/2021 10:29   CT HEAD WO CONTRAST  (5MM)  Result Date: 12/22/2021 CLINICAL DATA:  Stroke, follow-up.  Hypertension. EXAM: CT HEAD WITHOUT CONTRAST TECHNIQUE: Contiguous axial images were obtained from the base of the skull through the vertex without intravenous contrast. RADIATION DOSE REDUCTION: This exam was performed according to the departmental dose-optimization program which includes automated exposure control, adjustment of the mA and/or kV according to patient size and/or use of iterative reconstruction technique. COMPARISON:  CT and MRI studies 7/22 and 12/19/2021 FINDINGS: Brain: No acute finding by CT. Age related volume loss with chronic small-vessel ischemic changes of the cerebral hemispheric white matter. No cortical or large vessel territory infarction. No mass lesion, hemorrhage, hydrocephalus or extra-axial collection. No CT finding at the site of either punctate acute infarction suggested by MRI. Vascular: There is atherosclerotic calcification of the major vessels at the base of the brain. Skull: Negative Sinuses/Orbits: Clear/normal Other: None IMPRESSION: No acute CT finding. Atrophy and chronic small-vessel ischemic changes as seen previously. Vascular calcification. No CT finding at the site of either punctate acute infarction suggested by MRI. Electronically Signed   By: Nelson Chimes M.D.   On: 12/22/2021 08:35   MR BRAIN WO CONTRAST  Result Date: 12/19/2021 CLINICAL DATA:  Provided history: Neuro deficit, acute, stroke suspected. EXAM: MRI HEAD WITHOUT CONTRAST TECHNIQUE: Multiplanar, multiecho pulse sequences of the brain and surrounding structures were obtained without intravenous contrast. COMPARISON:  CT angiogram head/neck 12/18/2021. Non-contrast head CT 12/18/2021. FINDINGS: Intermittently motion degraded examination (with up to moderate motion degradation of the acquired sequences). Brain: Moderate generalized cerebral atrophy. Comparatively mild cerebellar atrophy. 6 mm acute infarct at the junction of the  posterior pons and middle cerebellar peduncle on the right (series 2, image 15) (series 3, image 17). 3 mm focus of subtle diffusion weighted signal abnormality within the posterior limb of left internal capsule, likely reflecting an additional small acute infarct (for instance as seen on series 2, image 29) (series 3, image 20). Chronic small-vessel infarcts within bilateral cerebral hemispheric white matter and within the left basal ganglia/internal capsule. Background moderate multifocal T2 FLAIR hyperintense signal abnormality within the cerebral white matter, nonspecific but compatible chronic small vessel ischemic disease. Tiny T2 hyperintense focus within the left middle cerebellar peduncle, which may reflect a prominent perivascular space or chronic lacunar infarct (series 6, image 8). Suspected tiny chronic infarct within the inferior right cerebellar hemisphere (series 6, image 5). Chronic microhemorrhage within the medial left cerebellar hemisphere. No evidence of an intracranial mass. No extra-axial fluid collection. No midline shift. Vascular: Signal abnormality within the intracranial left vertebral artery compatible with vessel occlusion (as was demonstrated on the CTA head/neck 12/18/2021). Flow voids preserved elsewhere within the proximal large arterial vessels. Skull and upper cervical spine: No focal suspicious marrow lesion. Incompletely assessed cervical spondylosis. Sinuses/Orbits: Minimal mucosal thickening within the bilateral frontal and ethmoid sinuses. IMPRESSION: 1. Motion degraded examination. 2. 6 mm acute infarct at the junction of the posterior pons and middle cerebellar peduncle on the right. 3. Probable additional 3 mm acute infarct within the posterior limb of left internal capsule. 4. Background cerebral atrophy, chronic small vessel ischemic disease and chronic infarcts as detailed. 5. Signal abnormality within the intracranial left vertebral artery compatible with vessel  occlusion (as was demonstrated on the CTA head/neck of 12/18/2021). Electronically Signed  By: Kellie Simmering D.O.   On: 12/19/2021 18:21   ECHOCARDIOGRAM COMPLETE  Result Date: 12/19/2021    ECHOCARDIOGRAM REPORT   Patient Name:   Noell Pulido Date of Exam: 12/19/2021 Medical Rec #:  [id number]     Height:       68.0 in Accession #:    [id number]    Weight:       121.5 lb Date of Birth:  September 06, 1950     BSA:          1.653 m Patient Age:    16 years      BP:           156/81 mmHg Patient Gender: M             HR:           64 bpm. Exam Location:  Inpatient Procedure: 2D Echo, Cardiac Doppler and Color Doppler Indications:    Stroke I63.9  History:        Patient has prior history of Echocardiogram examinations, most                 recent 12/01/2017. CAD, Prior CABG, COPD; Risk Factors:Diabetes,                 Hypertension and Former Smoker. End stage renal disease.  Sonographer:    Darlina Sicilian RDCS Referring Phys: [id number] [place]  1. Mid cavitary obstruction with max instantaneous gradient of 32 mmHg. Flow acceration at the basal septum and mid cavity.. Left ventricular ejection fraction, by estimation, is >75%. The left ventricle has hyperdynamic function. The left ventricle has  no regional wall motion abnormalities. Left ventricular diastolic parameters are indeterminate.  2. Right ventricular systolic function is hyperdynamic. The right ventricular size is normal. There is normal pulmonary artery systolic pressure. The estimated right ventricular systolic pressure is 19.6 mmHg.  3. Mitral valve has a mild post inflammatory appearance with leaflet excursion. The mitral valve is grossly normal. Trivial mitral valve regurgitation. No evidence of mitral stenosis.  4. The aortic valve is abnormal. There is moderate calcification of the aortic valve. Aortic valve regurgitation is not visualized. Aortic valve sclerosis/calcification is present, without any evidence of aortic stenosis.  5. The  inferior vena cava is normal in size with greater than 50% respiratory variability, suggesting right atrial pressure of 3 mmHg. Conclusion(s)/Recommendation(s): No intracardiac source of embolism detected on this transthoracic study. Consider a transesophageal echocardiogram to exclude cardiac source of embolism if clinically indicated. FINDINGS  Left Ventricle: Mid cavitary obstruction with max instantaneous gradient of 32 mmHg. Flow acceration at the basal septum and mid cavity. Left ventricular ejection fraction, by estimation, is >75%. The left ventricle has hyperdynamic function. The left ventricle has no regional wall motion abnormalities. The left ventricular internal cavity size was normal in size. There is no left ventricular hypertrophy. Left ventricular diastolic parameters are indeterminate. Right Ventricle: The right ventricular size is normal. No increase in right ventricular wall thickness. Right ventricular systolic function is hyperdynamic. There is normal pulmonary artery systolic pressure. The tricuspid regurgitant velocity is 2.28 m/s, and with an assumed right atrial pressure of 3 mmHg, the estimated right ventricular systolic pressure is 22.2 mmHg. Left Atrium: Left atrial size was normal in size. Right Atrium: Right atrial size was normal in size. Pericardium: There is no evidence of pericardial effusion. Mitral Valve: Mitral valve has a mild post inflammatory appearance with leaflet excursion. The mitral valve is grossly normal. Trivial  mitral valve regurgitation. No evidence of mitral valve stenosis. Tricuspid Valve: The tricuspid valve is normal in structure. Tricuspid valve regurgitation is mild . No evidence of tricuspid stenosis. Aortic Valve: The aortic valve is abnormal. There is moderate calcification of the aortic valve. Aortic valve regurgitation is not visualized. Aortic valve sclerosis/calcification is present, without any evidence of aortic stenosis. Pulmonic Valve: The pulmonic  valve was normal in structure. Pulmonic valve regurgitation is not visualized. No evidence of pulmonic stenosis. Aorta: The aortic root is normal in size and structure. Venous: The inferior vena cava is normal in size with greater than 50% respiratory variability, suggesting right atrial pressure of 3 mmHg. IAS/Shunts: The atrial septum is grossly normal.  LEFT VENTRICLE PLAX 2D LVIDd:         3.70 cm     Diastology LVIDs:         2.10 cm     LV e' medial:    4.99 cm/s LV PW:         1.00 cm     LV E/e' medial:  16.0 LV IVS:        0.90 cm     LV e' lateral:   6.13 cm/s LVOT diam:     2.00 cm     LV E/e' lateral: 13.0 LV SV:         74 LV SV Index:   45 LVOT Area:     3.14 cm  LV Volumes (MOD) LV vol d, MOD A4C: 94.0 ml LV vol s, MOD A4C: 10.5 ml LV SV MOD A4C:     94.0 ml RIGHT VENTRICLE RV S prime:     14.00 cm/s TAPSE (M-mode): 1.7 cm LEFT ATRIUM             Index        RIGHT ATRIUM          Index LA diam:        2.40 cm 1.45 cm/m   RA Area:     9.57 cm LA Vol (A2C):   37.1 ml 22.44 ml/m  RA Volume:   17.10 ml 10.34 ml/m LA Vol (A4C):   32.1 ml 19.41 ml/m LA Biplane Vol: 36.0 ml 21.77 ml/m  AORTIC VALVE LVOT Vmax:   112.00 cm/s LVOT Vmean:  75.400 cm/s LVOT VTI:    0.235 m  AORTA Ao Root diam: 2.90 cm MITRAL VALVE               TRICUSPID VALVE MV Area (PHT): 2.15 cm    TR Peak grad:   20.8 mmHg MV Decel Time: 353 msec    TR Vmax:        228.00 cm/s MV E velocity: 79.70 cm/s MV A velocity: 83.80 cm/s  SHUNTS MV E/A ratio:  0.95        Systemic VTI:  0.24 m                            Systemic Diam: 2.00 cm Cherlynn Kaiser MD Electronically signed by Cherlynn Kaiser MD Signature Date/Time: 12/19/2021/12:49:12 PM    Final    CT ANGIO HEAD NECK W WO CM (CODE STROKE)  Result Date: 12/18/2021 CLINICAL DATA:  Provided history: Neuro deficit, acute, stroke suspected. EXAM: CT ANGIOGRAPHY HEAD AND NECK TECHNIQUE: Multidetector CT imaging of the head and neck was performed using the standard protocol during  bolus administration of intravenous contrast. Multiplanar CT image reconstructions and MIPs  were obtained to evaluate the vascular anatomy. Carotid stenosis measurements (when applicable) are obtained utilizing NASCET criteria, using the distal internal carotid diameter as the denominator. RADIATION DOSE REDUCTION: This exam was performed according to the departmental dose-optimization program which includes automated exposure control, adjustment of the mA and/or kV according to patient size and/or use of iterative reconstruction technique. CONTRAST:  73mL OMNIPAQUE IOHEXOL 350 MG/ML SOLN COMPARISON:  Noncontrast head CT performed earlier today 12/18/2021. FINDINGS: CTA NECK FINDINGS Aortic arch: Standard aortic branching. The visualized aortic arch is normal in caliber. Minimal atherosclerotic plaque within the proximal left subclavian artery. No hemodynamically significant innominate or proximal subclavian artery stenosis. No hemodynamically significant innominate or proximal subclavian artery stenosis. Right carotid system: CCA and ICA patent within the neck without hemodynamically significant stenosis (50% or greater). Mild atherosclerotic plaque within the proximal ICA. Left carotid system: CCA and ICA patent within the neck without stenosis or significant atherosclerotic disease. Vertebral arteries: The right vertebral artery is patent within the neck. Mild to moderate atherosclerotic narrowing at the origin of this vessel. There is also mild atherosclerotic narrowing at the level of the skull base. Only intermittent enhancement is seen within the left vertebral artery within the neck, and this vessel is likely functionally occluded. Skeleton: Cervical spondylosis. No acute fracture or aggressive osseous lesion. Other neck: No neck mass or cervical lymphadenopathy. Upper chest: Prior median sternotomy. No consolidation within the imaged lung apices. Review of the MIP images confirms the above findings CTA HEAD  FINDINGS Anterior circulation: The intracranial internal carotid arteries are patent. Atherosclerotic plaque within both vessels. Moderate stenosis within the supraclinoid right ICA. No more than mild stenosis elsewhere within the cranial ICAs. The M1 middle cerebral arteries are patent. Atherosclerotic irregularity of the M2 and more distal MCA vessels, bilaterally. Most notably, there is a moderate stenosis within an inferior division proximal right M2 MCA vessel (series 8, image 18). The anterior cerebral arteries are patent. Posterior circulation: The intracranial right vertebral artery is patent. Atherosclerotic plaque within this vessel with up to moderate stenosis. Only intermittent enhancement is seen within the intracranial left vertebral artery and this vessel is likely functionally occluded. At least some enhancement is seen within the proximal left PICA. The basilar artery is patent. The posterior cerebral arteries are patent. Hypoplastic left P1 segment with sizable left posterior communicating artery. A sizable right posterior communicating artery is also present. Moderate stenosis within the left P2 segment. Venous sinuses: Within the limitations of contrast timing, no convincing thrombus. Anatomic variants: As described. Review of the MIP images confirms the above findings These results were called by telephone at the time of interpretation on 12/18/2021 at 7:23 pm to provider [facility] , who verbally acknowledged these results. IMPRESSION: CTA neck: 1. 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 2. The right vertebral artery is patent. Mild-to-moderate atherosclerotic narrowing at the origin of this vessel. 3. The common carotid and internal carotid arteries are patent within the neck without hemodynamically significant stenosis. Mild atherosclerotic plaque about the right carotid bifurcation and within the  proximal right ICA. 4. Cervical spondylosis. CTA head: 1. Only intermittent enhancement is seen within the intracranial left vertebral artery and this vessel is likely functionally occluded. At least some enhancement is seen within the proximal left PICA. 2. Additional intracranial atherosclerotic disease with multifocal stenoses, as outlined and most notably as follows. Moderate stenosis within the supraclinoid right ICA. Moderate stenosis within an  inferior division proximal M2 right MCA vessel. Electronically Signed   By: Kellie Simmering D.O.   On: 12/18/2021 19:25   CT HEAD CODE STROKE WO CONTRAST  Result Date: 12/18/2021 CLINICAL DATA:  Code stroke. Neuro deficit, acute, stroke suspected. EXAM: CT HEAD WITHOUT CONTRAST TECHNIQUE: Contiguous axial images were obtained from the base of the skull through the vertex without intravenous contrast. RADIATION DOSE REDUCTION: This exam was performed according to the departmental dose-optimization program which includes automated exposure control, adjustment of the mA and/or kV according to patient size and/or use of iterative reconstruction technique. COMPARISON:  No pertinent prior exams available for comparison. FINDINGS: Brain: Moderate generalized cerebral atrophy.  Mild cerebellar atrophy. Chronic appearing lacunar infarct within the right frontoparietal white matter (series 4, image 22). Age-indeterminate lacunar infarct within the posterior limb of left internal capsule (series 4, image 15). Chronic lacunar infarcts elsewhere within the left internal capsule and lentiform nucleus. Background mild-to-moderate patchy and ill-defined hypoattenuation within the cerebral white matter, nonspecific but compatible with chronic small vessel disease. There is no acute intracranial hemorrhage. No demarcated cortical infarct. No extra-axial fluid collection. No evidence of an intracranial mass. No midline shift. Vascular: No hyperdense vessel.  Atherosclerotic  calcifications. Skull: No fracture or aggressive osseous lesion. Sinuses/Orbits: No mass or acute finding within the imaged orbits. Minimal mucosal thickening scattered within the paranasal sinuses at the imaged levels. ASPECTS ([place] Stroke Program Early CT Score) - Ganglionic level infarction (caudate, lentiform nuclei, internal capsule, insula, M1-M3 cortex): 6 - Supraganglionic infarction (M4-M6 cortex): 3 Total score (0-10 with 10 being normal): 9 Age-indeterminate lacunar infarct within the posterior limb of left internal capsule. These results were communicated to Dr. Curly Shores At 6:34 pmon 7/22/2023by text page via the [facility] messaging system. IMPRESSION: Age-indeterminate lacunar infarct within the posterior limb of left internal capsule. ASPECTS is 9. Background parenchymal atrophy, chronic small vessel ischemic disease and chronic lacunar infarcts, as described. Electronically Signed   By: Kellie Simmering D.O.   On: 12/18/2021 18:34   DG Chest Port 1 View  Result Date: 12/18/2021 CLINICAL DATA:  Vomiting and hypertension. EXAM: PORTABLE CHEST 1 VIEW COMPARISON:  01/09/2017 FINDINGS: Postoperative changes in the mediastinum. Right central venous catheter with tip over the low SVC region. Heart size and pulmonary vascularity are normal. Lungs are clear. No pleural effusions. No pneumothorax. Mediastinal contours appear intact. IMPRESSION: No active disease. Electronically Signed   By: Lucienne Capers M.D.   On: 12/18/2021 18:19     Discharge Exam: Vitals:   12/31/21 1130 12/31/21 1200  BP: (!) 150/66 129/64  Pulse: 64 62  Resp: 17 12  Temp:    SpO2: 100% 99%   Vitals:   12/31/21 1030 12/31/21 1100 12/31/21 1130 12/31/21 1200  BP: (!) 130/59 (!) 131/56 (!) 150/66 129/64  Pulse: 63 63 64 62  Resp: 13 14 17 12   Temp:      TempSrc:      SpO2: 97% 99% 100% 99%  Weight:        General: Pt is alert, awake, not in acute distress Cardiovascular: RRR, S1/S2 +, no rubs, no gallops Respiratory:  CTA bilaterally, no wheezing, no rhonchi Abdominal: Soft, NT, ND, bowel sounds + Extremities: no edema, no cyanosis    The results of significant diagnostics from this hospitalization (including imaging, microbiology, ancillary and laboratory) are listed below for reference.     Microbiology: Recent Results (from the past 240 hour(s))  Blood Culture (routine x 2)  Status: None (Preliminary result)   Collection Time: 12/30/21  6:40 AM   Specimen: BLOOD RIGHT ARM  Result Value Ref Range Status   Specimen Description BLOOD RIGHT ARM  Final   Special Requests   Final    BOTTLES DRAWN AEROBIC ONLY Blood Culture results may not be optimal due to an inadequate volume of blood received in culture bottles   Culture   Final    NO GROWTH 1 DAY Performed at [facility] [address] [place], [place] [address]    Report Status PENDING  Incomplete  Blood Culture (routine x 2)     Status: None (Preliminary result)   Collection Time: 12/30/21  7:17 AM   Specimen: BLOOD RIGHT FOREARM  Result Value Ref Range Status   Specimen Description BLOOD RIGHT FOREARM  Final   Special Requests   Final    BOTTLES DRAWN AEROBIC AND ANAEROBIC Blood Culture adequate volume   Culture   Final    NO GROWTH 1 DAY Performed at [facility], [place] [address] [place],  [address]    Report Status PENDING  Incomplete     Labs: BNP (last 3 results) No results for input(s): "BNP" in the last 8760 hours. Basic Metabolic Panel: Recent Labs  Lab 12/27/21 1712 12/30/21 0717 12/31/21 0843  NA 131* 138 135  K 4.8 3.9 4.1  CL 93* 100 100  CO2 26 26 24   GLUCOSE 165* 110* 204*  BUN 63* 27* 47*  CREATININE 10.29* 5.53* 7.83*  CALCIUM 7.4* 8.9 8.2*  PHOS 5.5*  --  5.1*   Liver Function Tests: Recent Labs  Lab 12/27/21 1712 12/30/21 0717 12/31/21 0843  AST  --  42*  --   ALT  --  20  --   ALKPHOS  --  51  --   BILITOT  --  0.6  --   PROT  --  6.3*  --   ALBUMIN 2.7* 3.1* 2.6*    No results for input(s): "LIPASE", "AMYLASE" in the last 168 hours. Recent Labs  Lab 12/30/21 0717  AMMONIA 31   CBC: Recent Labs  Lab 12/30/21 0717 12/31/21 0844  WBC 8.9 7.6  NEUTROABS 7.5  --   HGB 11.6* 9.9*  HCT 35.7* 29.5*  MCV 98.3 97.4  PLT 295 306   Cardiac Enzymes: No results for input(s): "CKTOTAL", "CKMB", "CKMBINDEX", "TROPONINI" in the last 168 hours. BNP: Invalid input(s): "POCBNP" CBG: Recent Labs  Lab 12/30/21 1147 12/30/21 1310 12/30/21 1629 12/30/21 2207 12/31/21 0726  GLUCAP 126* 189* 186* 88 154*   D-Dimer No results for input(s): "DDIMER" in the last 72 hours. Hgb A1c No results for input(s): "HGBA1C" in the last 72 hours. Lipid Profile No results for input(s): "CHOL", "HDL", "LDLCALC", "TRIG", "CHOLHDL", "LDLDIRECT" in the last 72 hours. Thyroid function studies Recent Labs    12/30/21 0717  TSH 1.563   Anemia work up No results for input(s): "VITAMINB12", "FOLATE", "FERRITIN", "TIBC", "IRON", "RETICCTPCT" in the last 72 hours. Urinalysis    Component Value Date/Time   COLORURINE YELLOW 12/20/2021 0312   APPEARANCEUR TURBID (A) 12/20/2021 0312   LABSPEC 1.019 12/20/2021 0312   PHURINE 6.0 12/20/2021 0312   GLUCOSEU NEGATIVE 12/20/2021 0312   HGBUR SMALL (A) 12/20/2021 0312   BILIRUBINUR NEGATIVE 12/20/2021 0312   KETONESUR NEGATIVE 12/20/2021 0312   PROTEINUR 100 (A) 12/20/2021 0312   UROBILINOGEN 1.0 06/27/2010 0631   NITRITE NEGATIVE 12/20/2021 0312   LEUKOCYTESUR MODERATE (A) 12/20/2021 6160  Sepsis Labs Recent Labs  Lab 12/30/21 0717 12/31/21 0844  WBC 8.9 7.6   Microbiology Recent Results (from the past 240 hour(s))  Blood Culture (routine x 2)     Status: None (Preliminary result)   Collection Time: 12/30/21  6:40 AM   Specimen: BLOOD RIGHT ARM  Result Value Ref Range Status   Specimen Description BLOOD RIGHT ARM  Final   Special Requests   Final    BOTTLES DRAWN AEROBIC ONLY Blood Culture results may not  be optimal due to an inadequate volume of blood received in culture bottles   Culture   Final    NO GROWTH 1 DAY Performed at [facility], [address] [place], [place] [address]    Report Status PENDING  Incomplete  Blood Culture (routine x 2)     Status: None (Preliminary result)   Collection Time: 12/30/21  7:17 AM   Specimen: BLOOD RIGHT FOREARM  Result Value Ref Range Status   Specimen Description BLOOD RIGHT FOREARM  Final   Special Requests   Final    BOTTLES DRAWN AEROBIC AND ANAEROBIC Blood Culture adequate volume   Culture   Final    NO GROWTH 1 DAY Performed at [facility], [place] [address] [place], [place] [address]    Report Status PENDING  Incomplete     Time coordinating discharge: 35 minutes  SIGNED:   Rodena Goldmann, DO [facility] 12/31/2021, 12:04 PM  If 7PM-7AM, please contact night-coverage [email or web address]

## 2021-12-31 NOTE — Plan of Care (Signed)
  Problem: Education: Goal: Ability to describe self-care measures that may prevent or decrease complications (Diabetes Survival Skills Education) will improve Outcome: Adequate for Discharge Goal: Individualized Educational Video(s) Outcome: Adequate for Discharge   Problem: Coping: Goal: Ability to adjust to condition or change in health will improve Outcome: Adequate for Discharge   Problem: Health Behavior/Discharge Planning: Goal: Ability to identify and utilize available resources and services will improve Outcome: Adequate for Discharge Goal: Ability to manage health-related needs will improve Outcome: Adequate for Discharge   Problem: Metabolic: Goal: Ability to maintain appropriate glucose levels will improve Outcome: Adequate for Discharge   Problem: Nutritional: Goal: Maintenance of adequate nutrition will improve Outcome: Adequate for Discharge Goal: Progress toward achieving an optimal weight will improve Outcome: Adequate for Discharge   Problem: Skin Integrity: Goal: Risk for impaired skin integrity will decrease Outcome: Adequate for Discharge   Problem: Tissue Perfusion: Goal: Adequacy of tissue perfusion will improve Outcome: Adequate for Discharge

## 2021-12-31 NOTE — Inpatient Diabetes Management (Addendum)
Inpatient Diabetes Program Recommendations  AACE/ADA: New Consensus Statement on Inpatient Glycemic Control (2015)  Target Ranges:  Prepandial:   less than 140 mg/dL      Peak postprandial:   less than 180 mg/dL (1-2 hours)      Critically ill patients:  140 - 180 mg/dL   Lab Results  Component Value Date   GLUCAP 154 (H) 12/31/2021   HGBA1C 5.2 12/18/2021    Review of Glycemic Control  Latest Reference Range & Units 12/30/21 10:02 12/30/21 11:03 12/30/21 11:47 12/30/21 13:10 12/30/21 16:29 12/30/21 22:07 12/31/21 07:26  Glucose-Capillary 70 - 99 mg/dL 78 76 126 (H) 189 (H) 186 (H) 88 154 (H)  (H): Data is abnormally high  Latest Reference Range & Units 12/31/21 08:43  GFR, Estimated >60 mL/min 7 (L)  (L): Data is abnormally low  Latest Reference Range & Units 12/18/21 19:50  Hemoglobin A1C 4.8 - 5.6 % 5.2    Diabetes history: DM2 Outpatient Diabetes medications: Lantus 15 units QD Current orders for Inpatient glycemic control: Novolog 0-6 units TID  Inpatient Diabetes Program Recommendations:    Came to ED with severe hypoglycemia.  Has only needed 1 units of insulin since admission.  Please consider:  Discontinue basal insulin at discharge.  Given renal function,  would not recommend any oral DM medications at this time.  A1C 5.5%.  Will continue to follow while inpatient.  Thank you, Reche Dixon, MSN, [place] Diabetes Coordinator Inpatient Diabetes Program [phone] (team pager from 8a-5p)

## 2021-12-31 NOTE — Progress Notes (Signed)
D/C order noted. Contacted [facility] to advise clinic of pt's d/c today and that pt should resume care on Monday.   Melven Sartorius Renal Navigator [phone]

## 2021-12-31 NOTE — Progress Notes (Signed)
Report given to [facility].

## 2022-01-04 LAB — CULTURE, BLOOD (ROUTINE X 2)
Culture: NO GROWTH
Culture: NO GROWTH
Special Requests: ADEQUATE

## 2022-01-16 NOTE — Progress Notes (Deleted)
Cardiology Office Note:    Date:  01/16/2022   ID:  Shawn Collins, DOB [birth year], MRN [id number]  PCP:  Shawn Ebbs, MD   [facility] Providers Cardiologist:  Fransico Him, MD { Click to update primary MD,subspecialty MD or APP then REFRESH:1}    Referring MD: Shawn Ebbs, MD   Chief Complaint: ***  History of Present Illness:    Shawn Collins is a *** 71 y.o. 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 Servando Snare on 10/26/2016.  Was last seen in our office on 02/18/2020 by Dr. Radford Pax. Multiple hospital admissions since that time.  [facility] 7/22-12/28/21 for acute CVA s/p TNK.  Turn admission 8/3-12/31/21 with reported unresponsive at his facility and hypothermic.  With correction of blood glucose levels he was more responsive and back to baseline, no hypothermia.  Seen by nephrology and hemodialysis per routine schedule on the day of discharge and tolerated this well.  A1C 5.5%, advised to have close follow-up of blood glucose levels.  Echocardiogram 12/19/2021 revealed LV EF greater than 75% hyperdynamic function, no regional wall motion abnormalities, indeterminate diastolic parameters, hyperdynamic RV function, moderate aortic valve calcification without any evidence of aortic stenosis  Today, he is here   Past Medical History:  Diagnosis Date   CAD (coronary artery disease), native coronary artery    s/p NSTEMI with cath showing severe 3VD s/p  CABG x 3 with LIMA to LAD, SVG to OM1/OM2 and SVG D1 by Dr. Servando Snare on 10/26/16.   COPD (chronic obstructive pulmonary disease) ([place]) 09/10/2014   CVA (cerebral vascular accident) ([place]) 12/18/2021   Diabetes mellitus without complication ([place])    Type II   ESRD (end stage renal disease) ([place])    Hyperlipidemia LDL goal <70 04/17/2017   Hypertension     Past  Surgical History:  Procedure Laterality Date   AV FISTULA PLACEMENT Left 12/21/2021   Procedure: INSERTION OF LEFT ARM ARTERIOVENOUS (AV) GORE-TEX GRAFT;  Surgeon: Angelia Mould, MD;  Location: [facility] OR;  Service: Vascular;  Laterality: Left;   CORONARY ARTERY BYPASS GRAFT N/A 10/26/2016   Procedure: CORONARY ARTERY BYPASS GRAFTING (CABG) x 4 USING LEFT INTERNAL MAMMARY ARTERY TO LAD AND ENDOSCOPICALLY HARVESTED GREATER SAPHENOUS VEIN TO OM 1, 2 AND TO DIAG;  Surgeon: Grace Isaac, MD;  Location: [place];  Service: Open Heart Surgery;  Laterality: N/A;   ENDOVEIN HARVEST OF GREATER SAPHENOUS VEIN Right 10/26/2016   Procedure: ENDOVEIN HARVEST OF GREATER SAPHENOUS VEIN;  Surgeon: Grace Isaac, MD;  Location: [place];  Service: Open Heart Surgery;  Laterality: Right;   IR THORACENTESIS ASP PLEURAL SPACE W/IMG GUIDE  11/03/2016   LEFT HEART CATH AND CORONARY ANGIOGRAPHY N/A 10/21/2016   Procedure: Left Heart Cath and Coronary Angiography;  Surgeon: Martinique, Peter M, MD;  Location: [place] [facility];  Service: Cardiovascular;  Laterality: N/A;   TEE WITHOUT CARDIOVERSION N/A 10/26/2016   Procedure: TRANSESOPHAGEAL ECHOCARDIOGRAM (TEE);  Surgeon: Grace Isaac, MD;  Location: [place];  Service: Open Heart Surgery;  Laterality: N/A;    Current Medications: No outpatient medications have been marked as taking for the 01/18/22 encounter (Appointment) with Ann Maki, Lanice Schwab, NP.     Allergies:   Patient has no known allergies.   Social History   Socioeconomic History   Marital  status: Single    Spouse name: Not on file   Number of children: Not on file   Years of education: Not on file   Highest education level: Not on file  Occupational History   Occupation: retired  Tobacco Use   Smoking status: Former    Packs/day: 0.25    Years: 8.00    Total pack years: 2.00    Types: Cigarettes    Quit date: 1972    Years since quitting: 51.6   Smokeless tobacco: Never  Vaping Use    Vaping Use: Never used  Substance and Sexual Activity   Alcohol use: Not Currently   Drug use: Not Currently   Sexual activity: Not on file  Other Topics Concern   Not on file  Social History Narrative   Not on file   Social Determinants of Health   Financial Resource Strain: Not on file  Food Insecurity: Not on file  Transportation Needs: Not on file  Physical Activity: Not on file  Stress: Not on file  Social Connections: Not on file     Family History: The patient's ***family history includes Diabetes Mellitus II in his mother and sister; Hypertension in his father; Stroke in his father.  ROS:   Please see the history of present illness.    *** All other systems reviewed and are negative.  Labs/Other Studies Reviewed:    The following studies were reviewed today:  Echo 12/19/21  1. Mid cavitary obstruction with max instantaneous gradient of 32 mmHg.  Flow acceration at the basal septum and mid cavity.. Left ventricular  ejection fraction, by estimation, is >75%. The left ventricle has  hyperdynamic function. The left ventricle has   no regional wall motion abnormalities. Left ventricular diastolic  parameters are indeterminate.   2. Right ventricular systolic function is hyperdynamic. The right  ventricular size is normal. There is normal pulmonary artery systolic  pressure. The estimated right ventricular systolic pressure is 34.1 mmHg.   3. Mitral valve has a mild post inflammatory appearance with leaflet  excursion. The mitral valve is grossly normal. Trivial mitral valve  regurgitation. No evidence of mitral stenosis.   4. The aortic valve is abnormal. There is moderate calcification of the  aortic valve. Aortic valve regurgitation is not visualized. Aortic valve  sclerosis/calcification is present, without any evidence of aortic  stenosis.   5. The inferior vena cava is normal in size with greater than 50%  respiratory variability, suggesting right atrial  pressure of 3 mmHg.   Conclusion(s)/Recommendation(s): No intracardiac source of embolism  detected on this transthoracic study. Consider a transesophageal  echocardiogram to exclude cardiac source of embolism if clinically  indicated.   ETT 01/11/2017  Blood pressure demonstrated a normal response to exercise. There was no ST segment deviation noted during stress.   1. Poor exercise tolerance.  2. Submaximal study.  3. No evidence for ischemia by ST segment analysis.   LHC 10/21/2016  Mid RCA lesion, 40 %stenosed. 2nd RPLB lesion, 90 %stenosed. Ost LAD to Prox LAD lesion, 95 %stenosed. 1st Diag lesion, 85 %stenosed. Ost Cx to Prox Cx lesion, 70 %stenosed. Ost 1st Mrg lesion, 90 %stenosed. LV end diastolic pressure is normal.   1. Severe 3 vessel obstructive CAD. Co-dominant circulation    - complex critical bifurcation LAD/first diagonal stenosis Medina classification 1,1,0    - Bifurcation proximal LCx/OM1 stenosis.    - 90% small PL branch 2. Normal LV function   Plan: Given complex multivessel disease  in a diabetic would recommend CT surgery consult.   Recent Labs: 12/19/2021: Magnesium 2.2 12/30/2021: ALT 20; TSH 1.563 12/31/2021: BUN 47; Creatinine, Ser 7.83; Hemoglobin 9.9; Platelets 306; Potassium 4.1; Sodium 135  Recent Lipid Panel    Component Value Date/Time   CHOL 160 12/19/2021 0559   CHOL 128 02/18/2020 0950   TRIG 86 12/23/2021 0455   HDL 44 12/19/2021 0559   HDL 40 02/18/2020 0950   CHOLHDL 3.6 12/19/2021 0559   VLDL 12 12/19/2021 0559   LDLCALC 104 (H) 12/19/2021 0559   LDLCALC 71 02/18/2020 0950     Risk Assessment/Calculations:   {Does this patient have ATRIAL FIBRILLATION?:[id number]}       Physical Exam:    VS:  There were no vitals taken for this visit.    Wt Readings from Last 3 Encounters:  12/31/21 119 lb 11.4 oz (54.3 kg)  12/27/21 126 lb 12.2 oz (57.5 kg)  09/30/21 129 lb (58.5 kg)     GEN: *** Well nourished, well developed in  no acute distress HEENT: Normal NECK: No JVD; No carotid bruits CARDIAC: ***RRR, no murmurs, rubs, gallops RESPIRATORY:  Clear to auscultation without rales, wheezing or rhonchi  ABDOMEN: Soft, non-tender, non-distended MUSCULOSKELETAL:  No edema; No deformity. *** pedal pulses, ***bilaterally SKIN: Warm and dry NEUROLOGIC:  Alert and oriented x 3 PSYCHIATRIC:  Normal affect   EKG:  EKG is *** ordered today.  The ekg ordered today demonstrates ***  No BP recorded.  {Refresh Note OR Click here to enter BP  :1}***    Diagnoses:    No diagnosis found. Assessment and Plan:     CAD Hypertension: Hyperlipidemia:  {Are you ordering a CV Procedure (e.g. stress test, cath, DCCV, TEE, etc)?   Press F2        :[id number]}   Disposition:  Medication Adjustments/Labs and Tests Ordered: Current medicines are reviewed at length with the patient today.  Concerns regarding medicines are outlined above.  No orders of the defined types were placed in this encounter.  No orders of the defined types were placed in this encounter.   There are no Patient Instructions on file for this visit.   Signed, Emmaline Life, NP  01/16/2022 6:25 PM    [facility]

## 2022-01-18 ENCOUNTER — Ambulatory Visit: Payer: Medicare Other | Admitting: Nurse Practitioner

## 2022-02-23 NOTE — Progress Notes (Deleted)
[facility] [address] [place] [address] [phone]       HOSPITAL FOLLOW UP NOTE  Mr. Mechel Schutter Date of Birth:  [birth year] Medical Record Number:  [id number]   Reason for Referral:  hospital stroke follow up    SUBJECTIVE:   CHIEF COMPLAINT:  No chief complaint on file.   HPI:   Christon Parada  is a 71 y.o. 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  Personally reviewed hospitalization pertinent progress notes, lab work and imaging.  Evaluated by Dr. Erlinda Hong for small right cerebellar peduncle infarct s/p TNK likely due to small vessel disease.  LDL 104.  A1c 5.2.  Recommended DAPT for 3 weeks then Plavix alone as well as increase atorvastatin from 20 mg to 40 mg daily.  Evaluated by therapies, initially recommended CIR but then eventually recommended SNF and discharged on 8/1.        PERTINENT IMAGING  Per hospitalization 12/18/2021 -12/28/2021 CT Head 7/22: age indeterminate lacunar infarct posterior limb of L internal capsule, ASPECTS 9.  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  Moderate stenosis within the supraclinoid right ICA. Moderate stenosis within an inferior division proximal M2 right MCA vessel. MRI Brain 7/23:  6 mm acute infarct at the junction of the posterior pons and middle cerebellar peduncle on the right. 3 mm later subacute infarct within the PLIC MRI brain 4/19 - stable right cerebellar peduncle infarct, previous small left PLIC infarct not visualized. 2D Echo EF > 75% LDL 104 A1C 5.2    ROS:   14 system review of systems performed and negative with exception of ***  PMH:  Past Medical History:  Diagnosis Date   CAD (coronary artery disease), native coronary artery    s/p NSTEMI with cath showing severe 3VD s/p  CABG x 3 with LIMA to LAD, SVG to  OM1/OM2 and SVG D1 by Dr. Servando Snare on 10/26/16.   COPD (chronic obstructive pulmonary disease) ([place]) 09/10/2014   CVA (cerebral vascular accident) ([place]) 12/18/2021   Diabetes mellitus without complication ([place])    Type II   ESRD (end stage renal disease) ([place])    Hyperlipidemia LDL goal <70 04/17/2017   Hypertension     PSH:  Past Surgical History:  Procedure Laterality Date   AV FISTULA PLACEMENT Left 12/21/2021   Procedure: INSERTION OF LEFT ARM ARTERIOVENOUS (AV) GORE-TEX GRAFT;  Surgeon: Angelia Mould, MD;  [facility];  Service: Vascular;  Laterality: Left;   CORONARY ARTERY BYPASS GRAFT N/A 10/26/2016   Procedure: CORONARY ARTERY BYPASS GRAFTING (CABG) x 4 USING LEFT INTERNAL MAMMARY ARTERY TO LAD AND ENDOSCOPICALLY HARVESTED GREATER SAPHENOUS VEIN TO OM 1, 2 AND TO DIAG;  Surgeon: Grace Isaac, MD;  Location: [place];  Service: Open Heart Surgery;  Laterality: N/A;   ENDOVEIN HARVEST OF GREATER SAPHENOUS VEIN Right 10/26/2016   Procedure: ENDOVEIN HARVEST OF GREATER SAPHENOUS VEIN;  Surgeon: Grace Isaac, MD;  Location: [place];  Service: Open Heart Surgery;  Laterality: Right;   IR THORACENTESIS ASP PLEURAL SPACE W/IMG GUIDE  11/03/2016   LEFT HEART CATH AND CORONARY ANGIOGRAPHY N/A 10/21/2016   Procedure: Left Heart Cath and Coronary Angiography;  Surgeon: Martinique, Peter M, MD;  Location: [place] [facility];  Service: Cardiovascular;  Laterality: N/A;   TEE WITHOUT CARDIOVERSION N/A 10/26/2016  Procedure: TRANSESOPHAGEAL ECHOCARDIOGRAM (TEE);  Surgeon: Grace Isaac, MD;  Location: [place];  Service: Open Heart Surgery;  Laterality: N/A;    Social History:  Social History   Socioeconomic History   Marital status: Single    Spouse name: Not on file   Number of children: Not on file   Years of education: Not on file   Highest education level: Not on file  Occupational History   Occupation: retired  Tobacco Use   Smoking status: Former    Packs/day: 0.25     Years: 8.00    Total pack years: 2.00    Types: Cigarettes    Quit date: 1972    Years since quitting: 51.7   Smokeless tobacco: Never  Vaping Use   Vaping Use: Never used  Substance and Sexual Activity   Alcohol use: Not Currently   Drug use: Not Currently   Sexual activity: Not on file  Other Topics Concern   Not on file  Social History Narrative   Not on file   Social Determinants of Health   Financial Resource Strain: Not on file  Food Insecurity: Not on file  Transportation Needs: Not on file  Physical Activity: Not on file  Stress: Not on file  Social Connections: Not on file  Intimate Partner Violence: Not on file    Family History:  Family History  Problem Relation Age of Onset   Diabetes Mellitus II Mother    Stroke Father    Hypertension Father    Diabetes Mellitus II Sister     Medications:   Current Outpatient Medications on File Prior to Visit  Medication Sig Dispense Refill   amLODipine (NORVASC) 5 MG tablet Take 1 tablet (5 mg total) by mouth at bedtime. 30 tablet 1   aspirin EC 81 MG tablet Take 1 tablet (81 mg total) by mouth daily. Swallow whole. (Patient taking differently: Take 81 mg by mouth daily.) 30 tablet 12   atorvastatin (LIPITOR) 40 MG tablet Take 1 tablet (40 mg total) by mouth daily. (Patient taking differently: Take 40 mg by mouth at bedtime.) 30 tablet 1   carvedilol (COREG) 12.5 MG tablet Take 1 tablet (12.5 mg total) by mouth 2 (two) times daily with a meal. 30 tablet 1   clopidogrel (PLAVIX) 75 MG tablet Take 1 tablet (75 mg total) by mouth daily. 30 tablet 1   doxercalciferol (HECTOROL) 4 MCG/2ML injection Inject 2 mLs (4 mcg total) into the vein every Monday, Wednesday, and Friday with hemodialysis. 2 mL 0   furosemide (LASIX) 40 MG tablet Take 40 mg by mouth daily.     hydrALAZINE (APRESOLINE) 50 MG tablet Take 1 tablet (50 mg total) by mouth 3 (three) times daily. 30 tablet 1   losartan (COZAAR) 25 MG tablet Take 25 mg by  mouth daily.     nitroGLYCERIN (NITROSTAT) 0.4 MG SL tablet Place 0.4 mg under the tongue every 5 (five) minutes as needed for chest pain.     VELPHORO 500 MG chewable tablet Chew 500 mg by mouth 3 (three) times daily.     No current facility-administered medications on file prior to visit.    Allergies:  No Known Allergies    OBJECTIVE:  Physical Exam  There were no vitals filed for this visit. There is no height or weight on file to calculate BMI. No results found.     02/22/2017    4:32 PM  Depression screen PHQ 2/9  Decreased Interest 0  Down,  Depressed, Hopeless 0  PHQ - 2 Score 0     General: well developed, well nourished, seated, in no evident distress Head: head normocephalic and atraumatic.   Neck: supple with no carotid or supraclavicular bruits Cardiovascular: regular rate and rhythm, no murmurs Musculoskeletal: no deformity Skin:  no rash/petichiae Vascular:  Normal pulses all extremities   Neurologic Exam Mental Status: Awake and fully alert. Oriented to place and time. Recent and remote memory intact. Attention span, concentration and fund of knowledge appropriate. Mood and affect appropriate.  Cranial Nerves: Fundoscopic exam reveals sharp disc margins. Pupils equal, briskly reactive to light. Extraocular movements full without nystagmus. Visual fields full to confrontation. Hearing intact. Facial sensation intact. Face, tongue, palate moves normally and symmetrically.  Motor: Normal bulk and tone. Normal strength in all tested extremity muscles Sensory.: intact to touch , pinprick , position and vibratory sensation.  Coordination: Rapid alternating movements normal in all extremities. Finger-to-nose and heel-to-shin performed accurately bilaterally. Gait and Station: Arises from chair without difficulty. Stance is normal. Gait demonstrates normal stride length and balance with ***. Tandem walk and heel toe ***.  Reflexes: 1+ and symmetric. Toes downgoing.      NIHSS  *** Modified [place]  ***      ASSESSMENT: Shawn Collins is a 71 y.o. year old male with small right cerebellar peduncle infarct on 12/18/2021 s/p TNK likely due to small vessel disease. Vascular risk factors include HTN, HLD, DM, advanced age, former tobacco use, and CAD s/p CABG.      PLAN:  Right cerebellar stroke:  Residual deficit: ***.  Continue Plavix and atorvastatin (Lipitor) 40 mg daily for secondary stroke prevention.   Discussed secondary stroke prevention measures and importance of close PCP follow up for aggressive stroke risk factor management including BP goal<130/90, HLD with LDL goal<70 and DM with A1c.<7 .  Stroke labs 11/2021: LDL 104, A1c 5.2 I have gone over the pathophysiology of stroke, warning signs and symptoms, risk factors and their management in some detail with instructions to go to the closest emergency room for symptoms of concern.     Follow up in *** or call earlier if needed   CC:  [facility] provider: Dr. Leonie Man PCP: Nolene Ebbs, MD    I spent *** minutes of face-to-face and non-face-to-face time with patient.  This included previsit chart review including review of recent hospitalization, lab review, study review, order entry, electronic health record documentation, patient education regarding recent stroke including etiology, secondary stroke prevention measures and importance of managing stroke risk factors, residual deficits and typical recovery time and answered all other questions to patient satisfaction   Frann Rider, AGNP-BC  [facility] [address] [place], [place] [address]  Phone [phone] Fax [phone] Note: This document was prepared with digital dictation and possible smart phrase technology. Any transcriptional errors that result from this process are unintentional.

## 2022-02-24 ENCOUNTER — Inpatient Hospital Stay: Payer: Medicare Other | Admitting: Adult Health

## 2022-02-24 ENCOUNTER — Encounter: Payer: Self-pay | Admitting: Adult Health

## 2022-04-05 ENCOUNTER — Other Ambulatory Visit: Payer: Self-pay

## 2022-04-05 NOTE — Patient Outreach (Signed)
First telephone outreach attempt to obtain mRS. No answer. Unable to leave message for returned call.  [place] [facility]-Care Management Assistant [phone]  

## 2022-04-08 ENCOUNTER — Other Ambulatory Visit: Payer: Self-pay

## 2022-04-08 NOTE — Patient Outreach (Signed)
  Telephone outreach to patient's niece to obtain mRS was successfully completed. MRS=0  [place] Care Management Assistant [phone]

## 2022-06-09 ENCOUNTER — Ambulatory Visit: Payer: Medicare Other | Admitting: Cardiology

## 2022-07-04 ENCOUNTER — Other Ambulatory Visit: Payer: Self-pay

## 2022-07-04 ENCOUNTER — Observation Stay (HOSPITAL_COMMUNITY): Payer: Medicare Other

## 2022-07-04 ENCOUNTER — Inpatient Hospital Stay (HOSPITAL_COMMUNITY)
Admission: EM | Admit: 2022-07-04 | Discharge: 2022-07-07 | DRG: 312 | Disposition: A | Discharge status: Home / Self Care | Payer: Medicare Other | Source: Ambulatory Visit | Attending: Internal Medicine | Admitting: Internal Medicine

## 2022-07-04 ENCOUNTER — Encounter (HOSPITAL_COMMUNITY): Payer: Self-pay | Admitting: Emergency Medicine

## 2022-07-04 DIAGNOSIS — Z7982 Long term (current) use of aspirin: Secondary | ICD-10-CM

## 2022-07-04 DIAGNOSIS — Z87891 Personal history of nicotine dependence: Secondary | ICD-10-CM

## 2022-07-04 DIAGNOSIS — N186 End stage renal disease: Secondary | ICD-10-CM

## 2022-07-04 DIAGNOSIS — J449 Chronic obstructive pulmonary disease, unspecified: Secondary | ICD-10-CM | POA: Diagnosis present

## 2022-07-04 DIAGNOSIS — Z7902 Long term (current) use of antithrombotics/antiplatelets: Secondary | ICD-10-CM

## 2022-07-04 DIAGNOSIS — Z8249 Family history of ischemic heart disease and other diseases of the circulatory system: Secondary | ICD-10-CM

## 2022-07-04 DIAGNOSIS — I10 Essential (primary) hypertension: Secondary | ICD-10-CM | POA: Diagnosis present

## 2022-07-04 DIAGNOSIS — I120 Hypertensive chronic kidney disease with stage 5 chronic kidney disease or end stage renal disease: Secondary | ICD-10-CM | POA: Diagnosis present

## 2022-07-04 DIAGNOSIS — N2581 Secondary hyperparathyroidism of renal origin: Secondary | ICD-10-CM | POA: Diagnosis present

## 2022-07-04 DIAGNOSIS — R55 Syncope and collapse: Secondary | ICD-10-CM | POA: Diagnosis not present

## 2022-07-04 DIAGNOSIS — E119 Type 2 diabetes mellitus without complications: Secondary | ICD-10-CM

## 2022-07-04 DIAGNOSIS — Z823 Family history of stroke: Secondary | ICD-10-CM

## 2022-07-04 DIAGNOSIS — I252 Old myocardial infarction: Secondary | ICD-10-CM

## 2022-07-04 DIAGNOSIS — D631 Anemia in chronic kidney disease: Secondary | ICD-10-CM | POA: Diagnosis present

## 2022-07-04 DIAGNOSIS — I2510 Atherosclerotic heart disease of native coronary artery without angina pectoris: Secondary | ICD-10-CM | POA: Diagnosis present

## 2022-07-04 DIAGNOSIS — E1122 Type 2 diabetes mellitus with diabetic chronic kidney disease: Secondary | ICD-10-CM | POA: Diagnosis present

## 2022-07-04 DIAGNOSIS — Z79899 Other long term (current) drug therapy: Secondary | ICD-10-CM

## 2022-07-04 DIAGNOSIS — Z951 Presence of aortocoronary bypass graft: Secondary | ICD-10-CM

## 2022-07-04 DIAGNOSIS — Z992 Dependence on renal dialysis: Secondary | ICD-10-CM

## 2022-07-04 DIAGNOSIS — Y841 Kidney dialysis as the cause of abnormal reaction of the patient, or of later complication, without mention of misadventure at the time of the procedure: Secondary | ICD-10-CM | POA: Diagnosis present

## 2022-07-04 DIAGNOSIS — E785 Hyperlipidemia, unspecified: Secondary | ICD-10-CM | POA: Diagnosis present

## 2022-07-04 DIAGNOSIS — Z833 Family history of diabetes mellitus: Secondary | ICD-10-CM

## 2022-07-04 DIAGNOSIS — D638 Anemia in other chronic diseases classified elsewhere: Secondary | ICD-10-CM | POA: Diagnosis present

## 2022-07-04 DIAGNOSIS — Z8673 Personal history of transient ischemic attack (TIA), and cerebral infarction without residual deficits: Secondary | ICD-10-CM

## 2022-07-04 LAB — CBC WITH DIFFERENTIAL/PLATELET
Abs Immature Granulocytes: 0.01 10*3/uL (ref 0.00–0.07)
Basophils Absolute: 0.1 10*3/uL (ref 0.0–0.1)
Basophils Relative: 1 %
Eosinophils Absolute: 0.1 10*3/uL (ref 0.0–0.5)
Eosinophils Relative: 1 %
HCT: 38.3 % — ABNORMAL LOW (ref 39.0–52.0)
Hemoglobin: 12.5 g/dL — ABNORMAL LOW (ref 13.0–17.0)
Immature Granulocytes: 0 %
Lymphocytes Relative: 19 %
Lymphs Abs: 0.9 10*3/uL (ref 0.7–4.0)
MCH: 32 pg (ref 26.0–34.0)
MCHC: 32.6 g/dL (ref 30.0–36.0)
MCV: 98 fL (ref 80.0–100.0)
Monocytes Absolute: 0.6 10*3/uL (ref 0.1–1.0)
Monocytes Relative: 12 %
Neutro Abs: 3.2 10*3/uL (ref 1.7–7.7)
Neutrophils Relative %: 67 %
Platelets: 226 10*3/uL (ref 150–400)
RBC: 3.91 MIL/uL — ABNORMAL LOW (ref 4.22–5.81)
RDW: 13.2 % (ref 11.5–15.5)
WBC: 4.8 10*3/uL (ref 4.0–10.5)
nRBC: 0 % (ref 0.0–0.2)

## 2022-07-04 LAB — COMPREHENSIVE METABOLIC PANEL
ALT: 19 U/L (ref 0–44)
AST: 30 U/L (ref 15–41)
Albumin: 3.8 g/dL (ref 3.5–5.0)
Alkaline Phosphatase: 58 U/L (ref 38–126)
Anion gap: 17 — ABNORMAL HIGH (ref 5–15)
BUN: 36 mg/dL — ABNORMAL HIGH (ref 8–23)
CO2: 28 mmol/L (ref 22–32)
Calcium: 8.5 mg/dL — ABNORMAL LOW (ref 8.9–10.3)
Chloride: 93 mmol/L — ABNORMAL LOW (ref 98–111)
Creatinine, Ser: 6.61 mg/dL — ABNORMAL HIGH (ref 0.61–1.24)
GFR, Estimated: 8 mL/min — ABNORMAL LOW (ref >60)
Glucose, Bld: 105 mg/dL — ABNORMAL HIGH (ref 70–99)
Potassium: 4.4 mmol/L (ref 3.5–5.1)
Sodium: 138 mmol/L (ref 135–145)
Total Bilirubin: 0.4 mg/dL (ref 0.3–1.2)
Total Protein: 7.3 g/dL (ref 6.5–8.1)

## 2022-07-04 LAB — TROPONIN I (HIGH SENSITIVITY)
Troponin I (High Sensitivity): 23 ng/L — ABNORMAL HIGH (ref <18)
Troponin I (High Sensitivity): 21 ng/L — ABNORMAL HIGH (ref <18)

## 2022-07-04 LAB — CBG MONITORING, ED: Glucose-Capillary: 102 mg/dL — ABNORMAL HIGH (ref 70–99)

## 2022-07-04 MED ORDER — SODIUM CHLORIDE 0.9% FLUSH
3.0000 mL | Freq: Two times a day (BID) | INTRAVENOUS | Status: DC
  Administered 2022-07-04 – 2022-07-07 (×6): 3 mL via INTRAVENOUS

## 2022-07-04 MED ORDER — ATORVASTATIN CALCIUM 40 MG PO TABS
40.0000 mg | ORAL_TABLET | Freq: Every day | ORAL | Status: DC
  Administered 2022-07-04 – 2022-07-06 (×3): 40 mg via ORAL
  Filled 2022-07-04 (×3): qty 1

## 2022-07-04 MED ORDER — ACETAMINOPHEN 325 MG PO TABS: 650.0000 mg | ORAL_TABLET | Freq: Four times a day (QID) | ORAL | Status: DC | PRN

## 2022-07-04 MED ORDER — AMLODIPINE BESYLATE 5 MG PO TABS
5.0000 mg | ORAL_TABLET | Freq: Once | ORAL | Status: AC
  Administered 2022-07-04: 5 mg via ORAL
  Filled 2022-07-04: qty 1

## 2022-07-04 MED ORDER — ENOXAPARIN SODIUM 40 MG/0.4ML IJ SOSY: 40.0000 mg | PREFILLED_SYRINGE | INTRAMUSCULAR | Status: DC

## 2022-07-04 MED ORDER — SUCROFERRIC OXYHYDROXIDE 500 MG PO CHEW
500.0000 mg | CHEWABLE_TABLET | Freq: Three times a day (TID) | ORAL | Status: DC
  Administered 2022-07-04 – 2022-07-07 (×8): 500 mg via ORAL
  Filled 2022-07-04 (×8): qty 1

## 2022-07-04 MED ORDER — ACETAMINOPHEN 650 MG RE SUPP: 650.0000 mg | Freq: Four times a day (QID) | RECTAL | Status: DC | PRN

## 2022-07-04 MED ORDER — PROCHLORPERAZINE EDISYLATE 10 MG/2ML IJ SOLN: 5.0000 mg | INTRAMUSCULAR | Status: DC | PRN

## 2022-07-04 MED ORDER — HYDRALAZINE HCL 25 MG PO TABS
50.0000 mg | ORAL_TABLET | Freq: Once | ORAL | Status: AC
  Administered 2022-07-04: 50 mg via ORAL
  Filled 2022-07-04: qty 2

## 2022-07-04 MED ORDER — CLOPIDOGREL BISULFATE 75 MG PO TABS
75.0000 mg | ORAL_TABLET | Freq: Every day | ORAL | Status: DC
  Administered 2022-07-05 – 2022-07-07 (×3): 75 mg via ORAL
  Filled 2022-07-04 (×3): qty 1

## 2022-07-04 MED ORDER — CARVEDILOL 12.5 MG PO TABS
12.5000 mg | ORAL_TABLET | Freq: Two times a day (BID) | ORAL | Status: DC
  Administered 2022-07-05 – 2022-07-06 (×3): 12.5 mg via ORAL
  Filled 2022-07-04 (×3): qty 1

## 2022-07-04 MED ORDER — HYDRALAZINE HCL 50 MG PO TABS
50.0000 mg | ORAL_TABLET | Freq: Three times a day (TID) | ORAL | Status: DC
  Administered 2022-07-04 – 2022-07-05 (×2): 50 mg via ORAL
  Filled 2022-07-04 (×2): qty 1

## 2022-07-04 MED ORDER — AMLODIPINE BESYLATE 5 MG PO TABS
5.0000 mg | ORAL_TABLET | Freq: Every day | ORAL | Status: DC
  Administered 2022-07-04: 5 mg via ORAL
  Filled 2022-07-04: qty 1

## 2022-07-04 MED ORDER — LOSARTAN POTASSIUM 25 MG PO TABS
25.0000 mg | ORAL_TABLET | Freq: Every day | ORAL | Status: DC
  Administered 2022-07-04 – 2022-07-07 (×4): 25 mg via ORAL
  Filled 2022-07-04 (×4): qty 1

## 2022-07-04 MED ORDER — ASPIRIN 81 MG PO TBEC
81.0000 mg | DELAYED_RELEASE_TABLET | Freq: Every day | ORAL | Status: DC
  Administered 2022-07-05 – 2022-07-07 (×3): 81 mg via ORAL
  Filled 2022-07-04 (×3): qty 1

## 2022-07-04 MED ORDER — NITROGLYCERIN 0.4 MG SL SUBL: 0.4000 mg | SUBLINGUAL_TABLET | SUBLINGUAL | Status: DC | PRN

## 2022-07-04 NOTE — H&P (Signed)
History and Physical    Patient: Shawn Collins VQM:[id number] DOB: 30-Jun-1950 DOA: 07/04/2022 DOS: the patient was seen and examined on 07/04/2022 PCP: Nolene Ebbs, MD  Patient coming from: Home  Chief Complaint:  Chief Complaint  Patient presents with   Loss of Consciousness   HPI: Shawn Collins is a 72 y.o. male with medical history significant of CAD, NSTEMI, history of CVA, hypertension, type 2 diabetes, hyperlipidemia, COPD, ESRD on hemodialysis who had a syncopal episode with no prodromal or postevent symptoms while he was in the last hour of dialysis.  They took an extra liter out today due to the patient having extra weight today.  He stated he was chewing a lot of ice over the weekend. He denied fever, chills, rhinorrhea, sore throat, wheezing or hemoptysis.  No chest pain, palpitations, diaphoresis, PND, orthopnea or pitting edema of the lower extremities.  No abdominal pain, nausea, emesis, diarrhea, constipation, melena or hematochezia.  No flank pain, dysuria, frequency or hematuria.  No polyuria, polydipsia, polyphagia or blurred vision.   Lab work: CBC showed a white count of 4.8, hemoglobin 12.5 g/dL platelets 226.  Troponin was 23 ng/L.  CMP showed normal LFTs, sodium, potassium and CO2.  Chloride was 93 mmol/L with an anion gap of 17.  Glucose 105, BUN 36, creatinine 6.61 and corrected calcium 8.6 mg/dL.  ED course: Initial vital signs were temperature 97.5 F, pulse 69, respiration 12, BP 201/94 mmHg O2 sat 100% on room air.  Patient received amlodipine 5 mg and hydralazine 50 mg p.o. x 1 each.   Review of Systems: As mentioned in the history of present illness. All other systems reviewed and are negative.  Past Medical History:  Diagnosis Date   CAD (coronary artery disease), native coronary artery    s/p NSTEMI with cath showing severe 3VD s/p  CABG x 3 with LIMA to LAD, SVG to OM1/OM2 and SVG D1 by Dr. Servando Snare on 10/26/16.   COPD (chronic obstructive pulmonary disease)  ([place]) 09/10/2014   CVA (cerebral vascular accident) ([place]) 12/18/2021   Diabetes mellitus without complication ([place])    Type II   ESRD (end stage renal disease) ([place])    Hyperlipidemia LDL goal <70 04/17/2017   Hypertension    Past Surgical History:  Procedure Laterality Date   AV FISTULA PLACEMENT Left 12/21/2021   Procedure: INSERTION OF LEFT ARM ARTERIOVENOUS (AV) GORE-TEX GRAFT;  Surgeon: Angelia Mould, MD;  Location[facility];  Service: Vascular;  Laterality: Left;   CORONARY ARTERY BYPASS GRAFT N/A 10/26/2016   Procedure: CORONARY ARTERY BYPASS GRAFTING (CABG) x 4 USING LEFT INTERNAL MAMMARY ARTERY TO LAD AND ENDOSCOPICALLY HARVESTED GREATER SAPHENOUS VEIN TO OM 1, 2 AND TO DIAG;  Surgeon: Grace Isaac, MD;  Location: [place];  Service: Open Heart Surgery;  Laterality: N/A;   ENDOVEIN HARVEST OF GREATER SAPHENOUS VEIN Right 10/26/2016   Procedure: ENDOVEIN HARVEST OF GREATER SAPHENOUS VEIN;  Surgeon: Grace Isaac, MD;  Location: [place];  Service: Open Heart Surgery;  Laterality: Right;   IR THORACENTESIS ASP PLEURAL SPACE W/IMG GUIDE  11/03/2016   LEFT HEART CATH AND CORONARY ANGIOGRAPHY N/A 10/21/2016   Procedure: Left Heart Cath and Coronary Angiography;  Surgeon: Martinique, Peter M, MD;  Location: [place] [facility];  Service: Cardiovascular;  Laterality: N/A;   TEE WITHOUT CARDIOVERSION N/A 10/26/2016   Procedure: TRANSESOPHAGEAL ECHOCARDIOGRAM (TEE);  Surgeon: Grace Isaac, MD;  Location: [place];  Service: Open Heart Surgery;  Laterality: N/A;  Social History:  reports that he quit smoking about 52 years ago. His smoking use included cigarettes. He has a 2.00 pack-year smoking history. He has never used smokeless tobacco. He reports that he does not currently use alcohol. He reports that he does not currently use drugs.  No Known Allergies  Family History  Problem Relation Age of Onset   Diabetes Mellitus II Mother    Stroke Father    Hypertension Father     Diabetes Mellitus II Sister     Prior to Admission medications   Medication Sig Start Date End Date Taking? Authorizing Provider  amLODipine (NORVASC) 5 MG tablet Take 1 tablet (5 mg total) by mouth at bedtime. 12/28/21   de Yolanda Manges, Cortney E, NP  aspirin EC 81 MG tablet Take 1 tablet (81 mg total) by mouth daily. Swallow whole. Patient taking differently: Take 81 mg by mouth daily. 12/29/21   de Yolanda Manges, Cortney E, NP  atorvastatin (LIPITOR) 40 MG tablet Take 1 tablet (40 mg total) by mouth daily. Patient taking differently: Take 40 mg by mouth at bedtime. 12/29/21   de Yolanda Manges, Cortney E, NP  carvedilol (COREG) 12.5 MG tablet Take 1 tablet (12.5 mg total) by mouth 2 (two) times daily with a meal. 12/28/21   de Yolanda Manges, Cortney E, NP  clopidogrel (PLAVIX) 75 MG tablet Take 1 tablet (75 mg total) by mouth daily. 12/29/21   de Yolanda Manges, Cortney E, NP  doxercalciferol (HECTOROL) 4 MCG/2ML injection Inject 2 mLs (4 mcg total) into the vein every Monday, Wednesday, and Friday with hemodialysis. 12/28/21   de Yolanda Manges, Cortney E, NP  furosemide (LASIX) 40 MG tablet Take 40 mg by mouth daily.    [provider]  hydrALAZINE (APRESOLINE) 50 MG tablet Take 1 tablet (50 mg total) by mouth 3 (three) times daily. 12/28/21   de Yolanda Manges, Cortney E, NP  losartan (COZAAR) 25 MG tablet Take 25 mg by mouth daily. 08/11/21   [provider]  nitroGLYCERIN (NITROSTAT) 0.4 MG SL tablet Place 0.4 mg under the tongue every 5 (five) minutes as needed for chest pain.    [provider]  VELPHORO 500 MG chewable tablet Chew 500 mg by mouth 3 (three) times daily. 06/04/21   [provider]    Physical Exam: Vitals:   07/04/22 1349 07/04/22 1354 07/04/22 1530  BP:  (!) 201/94 (!) 149/77  Pulse:  69 77  Resp:  12 19  Temp:  (!) 97.5 F (36.4 C)   TempSrc:  Oral   SpO2:  100% 100%  Weight: 65.8 kg    Height: 5\' 10"  (1.778 m)     Physical Exam Vitals and nursing note reviewed.   Constitutional:      General: He is awake. He is not in acute distress.    Appearance: Normal appearance.  HENT:     Head: Normocephalic.     Mouth/Throat:     Mouth: Mucous membranes are moist.  Eyes:     General: No scleral icterus.    Pupils: Pupils are equal, round, and reactive to light.  Neck:     Vascular: No JVD.  Cardiovascular:     Rate and Rhythm: Normal rate and regular rhythm.     Heart sounds: S1 normal and S2 normal.     Comments: LUE AV fistula. Pulmonary:     Effort: Pulmonary effort is normal.     Breath sounds: Normal breath sounds.  Abdominal:  General: Bowel sounds are normal. There is no distension.     Palpations: Abdomen is soft.     Tenderness: There is no abdominal tenderness.  Musculoskeletal:     Cervical back: Neck supple.     Right lower leg: No edema.     Left lower leg: No edema.  Skin:    General: Skin is warm and dry.  Neurological:     General: No focal deficit present.     Mental Status: He is alert and oriented to person, place, and time.  Psychiatric:        Mood and Affect: Mood normal.        Behavior: Behavior normal. Behavior is cooperative.    Data Reviewed:  Results are pending, will review when available.  Assessment and Plan: Principal Problem:   Syncope and collapse Observation/telemetry. Monitor and correct electrolyte abnormalities. Check carotid Doppler. Check echocardiogram. Cardiology consult requested.  Active Problems:   COPD (chronic obstructive pulmonary disease) (HCC) Supplemental oxygen as needed. Bronchodilators as needed.    Hypertension Continue amlodipine 5 mg p.o. daily. Continue carvedilol 12.5 mg p.o. twice daily. Continue hydralazine 50 mg p.o. 3 times daily. Continue losartan 25 mg p.o. daily. Monitor blood pressure and heart rate.    Hyperlipidemia LDL goal <70 Continue atorvastatin 40 mg p.o. daily.    CAD (coronary artery disease), native coronary artery Continue carvedilol  12.5 mg p.o. twice daily. Continue DAPT with ASA and clopidogrel. Continue losartan 25 mg p.o. daily. Continue statin as above. Sublingual nitroglycerin as needed.    ESRD (end stage renal disease) on dialysis [facility]) Consult nephrology if the patient needs dialysis while in the hospital.    Type 2 diabetes mellitus ([place]) Carbohydrate modified diet. CBG monitoring before meals and bedtime.    ESRD with anemia (HCC) Monitor hematocrit and hemoglobin. Erythropoietin as needed per nephrology.      Advance Care Planning:   Code Status: Full Code   Consults: Cardiology consult requested with cardiology master.  Family Communication:   Severity of Illness: The appropriate patient status for this patient is OBSERVATION. Observation status is judged to be reasonable and necessary in order to provide the required intensity of service to ensure the patient's safety. The patient's presenting symptoms, physical exam findings, and initial radiographic and laboratory data in the context of their medical condition is felt to place them at decreased risk for further clinical deterioration. Furthermore, it is anticipated that the patient will be medically stable for discharge from the hospital within 2 midnights of admission.   Author: Reubin Milan, MD 07/04/2022 4:20 PM  For on call review [email or web address].   This document was prepared using Dragon voice recognition software and may contain some unintended transcription errors.

## 2022-07-04 NOTE — ED Triage Notes (Signed)
Pt BIB [facility] from dialysis center due to witnessed syncopal episode in the last 18 minutes of treatment.  Pt normally has about 2666ml removed; he had excess fluid today and they drew off about 3691ml.  Pt has fistula on left forearm. Hx of stroke.

## 2022-07-04 NOTE — ED Notes (Signed)
ED TO INPATIENT HANDOFF REPORT  ED Nurse Name and Phone #: Duanne Guess [phone]  S Name/Age/Gender Shawn Collins 72 y.o. male Room/Bed: 038C/038C  Code Status   Code Status: Prior  Triage Complete: Triage complete  Chief Complaint Syncope and collapse [R55]  Triage Note Pt BIB [facility] from dialysis center due to witnessed syncopal episode in the last 18 minutes of treatment.  Pt normally has about 2653ml removed; he had excess fluid today and they drew off about 3635ml.  Pt has fistula on left forearm. Hx of stroke.   Allergies No Known Allergies  Level of Care/Admitting Diagnosis ED Disposition     ED Disposition  Admit   Condition  --   Comment  Hospital Area: [facility] [[id number]]  Level of Care: Telemetry Cardiac [103]  May place patient in observation at [facility] or [place] if equivalent level of care is available:: No  Covid Evaluation: Asymptomatic - no recent exposure (last 10 days) testing not required  Diagnosis: Syncope and collapse [780.2.ICD-9-CM]  Admitting Physician: Reubin Milan [[id number]]  Attending Physician: Reubin Milan [[id number]]          B Medical/Surgery History Past Medical History:  Diagnosis Date   CAD (coronary artery disease), native coronary artery    s/p NSTEMI with cath showing severe 3VD s/p  CABG x 3 with LIMA to LAD, SVG to OM1/OM2 and SVG D1 by Dr. Servando Snare on 10/26/16.   COPD (chronic obstructive pulmonary disease) ([place]) 09/10/2014   CVA (cerebral vascular accident) ([place]) 12/18/2021   Diabetes mellitus without complication ([place])    Type II   ESRD (end stage renal disease) ([place])    Hyperlipidemia LDL goal <70 04/17/2017   Hypertension    Past Surgical History:  Procedure Laterality Date   AV FISTULA PLACEMENT Left 12/21/2021   Procedure: INSERTION OF LEFT ARM ARTERIOVENOUS (AV) GORE-TEX GRAFT;  Surgeon: Angelia Mould, MD;  Location: [facility];  Service: Vascular;  Laterality: Left;    CORONARY ARTERY BYPASS GRAFT N/A 10/26/2016   Procedure: CORONARY ARTERY BYPASS GRAFTING (CABG) x 4 USING LEFT INTERNAL MAMMARY ARTERY TO LAD AND ENDOSCOPICALLY HARVESTED GREATER SAPHENOUS VEIN TO OM 1, 2 AND TO DIAG;  Surgeon: Grace Isaac, MD;  Location: [place];  Service: Open Heart Surgery;  Laterality: N/A;   ENDOVEIN HARVEST OF GREATER SAPHENOUS VEIN Right 10/26/2016   Procedure: ENDOVEIN HARVEST OF GREATER SAPHENOUS VEIN;  Surgeon: Grace Isaac, MD;  Location: [place];  Service: Open Heart Surgery;  Laterality: Right;   IR THORACENTESIS ASP PLEURAL SPACE W/IMG GUIDE  11/03/2016   LEFT HEART CATH AND CORONARY ANGIOGRAPHY N/A 10/21/2016   Procedure: Left Heart Cath and Coronary Angiography;  Surgeon: Martinique, Peter M, MD;  Location: [place];  Service: Cardiovascular;  Laterality: N/A;   TEE WITHOUT CARDIOVERSION N/A 10/26/2016   Procedure: TRANSESOPHAGEAL ECHOCARDIOGRAM (TEE);  Surgeon: Grace Isaac, MD;  Location: [place];  Service: Open Heart Surgery;  Laterality: N/A;     A IV Location/Drains/Wounds Patient Lines/Drains/Airways Status     Active Line/Drains/Airways     Name Placement date Placement time Site Days   Fistula / Graft Left Upper arm Arteriovenous vein graft 12/21/21  1103  Upper arm  195   Hemodialysis Catheter Right Subclavian Double lumen Temporary (Non-Tunneled) 12/19/21  0200  Subclavian  197   Incision (Closed) 10/26/16 Chest Other (Comment) 10/26/16  1520  -- 2077   Incision (Closed) 10/26/16 Leg Right 10/26/16  1546  --  2077   Incision (Closed) 12/21/21 Arm Left 12/21/21  1109  -- 195            Intake/Output Last 24 hours No intake or output data in the 24 hours ending 07/04/22 1642  Labs/Imaging Results for orders placed or performed during the hospital encounter of 07/04/22 (from the past 48 hour(s))  CBC WITH DIFFERENTIAL     Status: Abnormal   Collection Time: 07/04/22  2:18 PM  Result Value Ref Range   WBC 4.8 4.0 - 10.5 K/uL    RBC 3.91 (L) 4.22 - 5.81 MIL/uL   Hemoglobin 12.5 (L) 13.0 - 17.0 g/dL   HCT 38.3 (L) 39.0 - 52.0 %   MCV 98.0 80.0 - 100.0 fL   MCH 32.0 26.0 - 34.0 pg   MCHC 32.6 30.0 - 36.0 g/dL   RDW 13.2 11.5 - 15.5 %   Platelets 226 150 - 400 K/uL   nRBC 0.0 0.0 - 0.2 %   Neutrophils Relative % 67 %   Neutro Abs 3.2 1.7 - 7.7 K/uL   Lymphocytes Relative 19 %   Lymphs Abs 0.9 0.7 - 4.0 K/uL   Monocytes Relative 12 %   Monocytes Absolute 0.6 0.1 - 1.0 K/uL   Eosinophils Relative 1 %   Eosinophils Absolute 0.1 0.0 - 0.5 K/uL   Basophils Relative 1 %   Basophils Absolute 0.1 0.0 - 0.1 K/uL   Immature Granulocytes 0 %   Abs Immature Granulocytes 0.01 0.00 - 0.07 K/uL    Comment: Performed at [facility], [address] [place], [place] [address]  Comprehensive metabolic panel     Status: Abnormal   Collection Time: 07/04/22  2:18 PM  Result Value Ref Range   Sodium 138 135 - 145 mmol/L   Potassium 4.4 3.5 - 5.1 mmol/L   Chloride 93 (L) 98 - 111 mmol/L   CO2 28 22 - 32 mmol/L   Glucose, Bld 105 (H) 70 - 99 mg/dL    Comment: Glucose reference range applies only to samples taken after fasting for at least 8 hours.   BUN 36 (H) 8 - 23 mg/dL   Creatinine, Ser 6.61 (H) 0.61 - 1.24 mg/dL   Calcium 8.5 (L) 8.9 - 10.3 mg/dL   Total Protein 7.3 6.5 - 8.1 g/dL   Albumin 3.8 3.5 - 5.0 g/dL   AST 30 15 - 41 U/L   ALT 19 0 - 44 U/L   Alkaline Phosphatase 58 38 - 126 U/L   Total Bilirubin 0.4 0.3 - 1.2 mg/dL   GFR, Estimated 8 (L) >60 mL/min    Comment: (NOTE) Calculated using the CKD-EPI Creatinine Equation (2021)    Anion gap 17 (H) 5 - 15    Comment: Performed at [facility], [place] [address] [place], [place] [address]  Troponin I (High Sensitivity)     Status: Abnormal   Collection Time: 07/04/22  2:18 PM  Result Value Ref Range   Troponin I (High Sensitivity) 23 (H) <18 ng/L    Comment: (NOTE) Elevated high sensitivity troponin I (hsTnI) values and significant  changes across  serial measurements may suggest ACS but many other  chronic and acute conditions are known to elevate hsTnI results.  Refer to the "Links" section for chest pain algorithms and additional  guidance. Performed at [facility], [place] [address], [place], [place] [address]   CBG monitoring, ED     Status: Abnormal   Collection Time:  07/04/22  2:22 PM  Result Value Ref Range   Glucose-Capillary 102 (H) 70 - 99 mg/dL    Comment: Glucose reference range applies only to samples taken after fasting for at least 8 hours.   No results found.  Pending Labs Unresulted Labs (From admission, onward)    None       Vitals/Pain Today's Vitals   07/04/22 1349 07/04/22 1354 07/04/22 1530  BP:  (!) 201/94 (!) 149/77  Pulse:  69 77  Resp:  12 19  Temp:  (!) 97.5 F (36.4 C)   TempSrc:  Oral   SpO2:  100% 100%  Weight: 65.8 kg    Height: 5\' 10"  (1.778 m)    PainSc: 0-No pain 0-No pain     Isolation Precautions No active isolations  Medications Medications  amLODipine (NORVASC) tablet 5 mg (5 mg Oral Given 07/04/22 1424)  hydrALAZINE (APRESOLINE) tablet 50 mg (50 mg Oral Given 07/04/22 1424)    Mobility walks     Focused Assessments Cardiac Assessment Handoff:    Lab Results  Component Value Date   TROPONINI 0.59 ([place]) 10/16/2016   No results found for: "DDIMER" Does the Patient currently have chest pain? No    R Recommendations: See Admitting Provider Note  Report given to:   Additional Notes:

## 2022-07-04 NOTE — ED Provider Notes (Signed)
[facility] Provider Note   CSN: [id number] Arrival date & time: 07/04/22  1343     History  Chief Complaint  Patient presents with   Loss of Consciousness    Shawn Collins is a 72 y.o. male.  Patient with history of CVA, ESRD on hemodialysis, NSTEMI, CAD, hypertension presents today with complaints of syncope.  States that same occurred while he was receiving hemodialysis earlier today. States that he was laying in bed during the event and therefore did not have any injury. He is unable to tell me any other details regarding this episode.  States that he does not remember passing out or if he had any prodromal symptoms.  He did not bite his tongue or urinate on himself.  According to nursing report from EMS, patient did have more fluid drawn off during dialysis today than normal.  Patient denies any headache, vision changes, chest pain, shortness of breath, nausea, vomiting, diarrhea, or any other symptoms. Of note, patient states that he has not been taking any of his medications. States 'I have the medications but do not know how to take them.' He is unable to tell me how long he has not been taking his medications. He resides at a 'rooming house' which is not a group home or a SNF. He states they do not help him with medication management.   The history is provided by the patient. No language interpreter was used.  Loss of Consciousness      Home Medications Prior to Admission medications   Medication Sig Start Date End Date Taking? Authorizing Provider  amLODipine (NORVASC) 5 MG tablet Take 1 tablet (5 mg total) by mouth at bedtime. 12/28/21   de Yolanda Manges, Cortney E, NP  aspirin EC 81 MG tablet Take 1 tablet (81 mg total) by mouth daily. Swallow whole. Patient taking differently: Take 81 mg by mouth daily. 12/29/21   de Yolanda Manges, Cortney E, NP  atorvastatin (LIPITOR) 40 MG tablet Take 1 tablet (40 mg total) by mouth daily. Patient taking  differently: Take 40 mg by mouth at bedtime. 12/29/21   de Yolanda Manges, Cortney E, NP  carvedilol (COREG) 12.5 MG tablet Take 1 tablet (12.5 mg total) by mouth 2 (two) times daily with a meal. 12/28/21   de Yolanda Manges, Cortney E, NP  clopidogrel (PLAVIX) 75 MG tablet Take 1 tablet (75 mg total) by mouth daily. 12/29/21   de Yolanda Manges, Cortney E, NP  doxercalciferol (HECTOROL) 4 MCG/2ML injection Inject 2 mLs (4 mcg total) into the vein every Monday, Wednesday, and Friday with hemodialysis. 12/28/21   de Yolanda Manges, Cortney E, NP  furosemide (LASIX) 40 MG tablet Take 40 mg by mouth daily.    [provider]  hydrALAZINE (APRESOLINE) 50 MG tablet Take 1 tablet (50 mg total) by mouth 3 (three) times daily. 12/28/21   de Yolanda Manges, Cortney E, NP  losartan (COZAAR) 25 MG tablet Take 25 mg by mouth daily. 08/11/21   [provider]  nitroGLYCERIN (NITROSTAT) 0.4 MG SL tablet Place 0.4 mg under the tongue every 5 (five) minutes as needed for chest pain.    [provider]  VELPHORO 500 MG chewable tablet Chew 500 mg by mouth 3 (three) times daily. 06/04/21   [provider]      Allergies    Patient has no known allergies.    Review of Systems   Review of Systems  Cardiovascular:  Positive  for syncope.  Neurological:  Positive for syncope.  All other systems reviewed and are negative.   Physical Exam Updated Vital Signs BP (!) 201/94 (BP Location: Right Arm)   Pulse 69   Temp (!) 97.5 F (36.4 C) (Oral)   Resp 12   Ht 5\' 10"  (1.778 m)   Wt 65.8 kg   SpO2 100%   BMI 20.81 kg/m  Physical Exam Vitals and nursing note reviewed.  Constitutional:      General: He is not in acute distress.    Appearance: Normal appearance. He is normal weight. He is not ill-appearing, toxic-appearing or diaphoretic.  HENT:     Head: Normocephalic and atraumatic.  Eyes:     Extraocular Movements: Extraocular movements intact.     Pupils: Pupils are equal, round, and reactive to light.   Cardiovascular:     Rate and Rhythm: Normal rate and regular rhythm.     Heart sounds: Normal heart sounds.  Pulmonary:     Effort: Pulmonary effort is normal. No respiratory distress.     Breath sounds: Normal breath sounds.  Abdominal:     General: Abdomen is flat.     Palpations: Abdomen is soft.     Tenderness: There is no abdominal tenderness.  Musculoskeletal:        General: Normal range of motion.     Cervical back: Normal range of motion.     Right lower leg: No edema.     Left lower leg: No edema.  Skin:    General: Skin is warm and dry.  Neurological:     General: No focal deficit present.     Mental Status: He is alert and oriented to person, place, and time.  Psychiatric:        Mood and Affect: Mood normal.        Behavior: Behavior normal.     ED Results / Procedures / Treatments   Labs (all labs ordered are listed, but only abnormal results are displayed) Labs Reviewed  CBC WITH DIFFERENTIAL/PLATELET - Abnormal; Notable for the following components:      Result Value   RBC 3.91 (*)    Hemoglobin 12.5 (*)    HCT 38.3 (*)    All other components within normal limits  COMPREHENSIVE METABOLIC PANEL - Abnormal; Notable for the following components:   Chloride 93 (*)    Glucose, Bld 105 (*)    BUN 36 (*)    Creatinine, Ser 6.61 (*)    Calcium 8.5 (*)    GFR, Estimated 8 (*)    Anion gap 17 (*)    All other components within normal limits  CBG MONITORING, ED - Abnormal; Notable for the following components:   Glucose-Capillary 102 (*)    All other components within normal limits  TROPONIN I (HIGH SENSITIVITY) - Abnormal; Notable for the following components:   Troponin I (High Sensitivity) 23 (*)    All other components within normal limits    EKG EKG Interpretation  Date/Time:  Monday July 04 2022 14:26:29 EST Ventricular Rate:  72 PR Interval:  140 QRS Duration: 86 QT Interval:  447 QTC Calculation: 490 R Axis:   68 Text  Interpretation: Sinus rhythm Borderline prolonged QT interval Confirmed by Godfrey Pick ([id number]) on 07/04/2022 3:36:39 PM  Radiology No results found.  Procedures Procedures    Medications Ordered in ED Medications  amLODipine (NORVASC) tablet 5 mg (5 mg Oral Given 07/04/22 1424)  hydrALAZINE (APRESOLINE) tablet 50 mg (50  mg Oral Given 07/04/22 1424)    ED Course/ Medical Decision Making/ A&P                             Medical Decision Making Amount and/or Complexity of Data Reviewed Labs: ordered. ECG/medicine tests: ordered.  Risk Prescription drug management.   This patient is a 72 y.o. male who presents to the ED for concern of syncope, this involves an extensive number of treatment options, and is a complaint that carries with it a high risk of complications and morbidity. The emergent differential diagnosis prior to evaluation includes, but is not limited to,  CVA, ACS, arrhythmia, vasovagal syncope, orthostatic hypotension, sepsis, hypoglycemia, electrolyte disturbance, respiratory failure, symptomatic anemia, dehydration, heat injury, polypharmacy, malignancy, anxiety/panic attack.   This is not an exhaustive differential.   Past Medical History / Co-morbidities / Social History: history of CVA, ESRD on hemodialysis, NSTEMI, CAD, hypertension  Physical Exam: Physical exam performed. The pertinent findings include: no acute physical exam findings  Lab Tests: I ordered, and personally interpreted labs.  The pertinent results include:  hgb 12.5 up from 9.9 6 months ago. Kidney function at baseline. Troponin 23, chart reviewed and patients troponin is always elevated.     Cardiac Monitoring:  The patient was maintained on a cardiac monitor.  My attending physician Dr. Doren Custard viewed and interpreted the cardiac monitored which showed an underlying rhythm of: no STEMI. I agree with this interpretation.   Medications: I ordered medication including amlodopine and hydralazine  for  hypertension. Reevaluation of the patient after these medicines showed that the patient improved. I have reviewed the patients home medicines and have made adjustments as needed.  Disposition:  Patient presents today with complaints of syncope prior to arrival.  He is afebrile, nontoxic-appearing, and in no acute distress. He was initially markedly hypertensive, improved on home meds that he had reportedly not been taking. Patient did have more fluid drawn off at dialysis today is likely etiology of his symptoms, however given patients increased risk factors with concerning story, patient will need admission for further work-up and likely cardiology consultation for repeat echo. Will likely also need social work involvement to assist with medication management. Patient is understanding and amenable with plan.  Discussed patient with hospitalist Dr. Olevia Bowens who agrees to admit.   This is a shared visit with supervising physician Dr. Doren Custard who has independently evaluated patient & provided guidance in evaluation/management/disposition, in agreement with care    Final Clinical Impression(s) / ED Diagnoses Final diagnoses:  Syncope, unspecified syncope type    Rx / DC Orders ED Discharge Orders     None         Nestor Lewandowsky 07/04/22 1649    Godfrey Pick, MD 07/06/22 1606

## 2022-07-05 ENCOUNTER — Observation Stay (HOSPITAL_COMMUNITY): Payer: Medicare Other

## 2022-07-05 DIAGNOSIS — J449 Chronic obstructive pulmonary disease, unspecified: Secondary | ICD-10-CM | POA: Diagnosis present

## 2022-07-05 DIAGNOSIS — Z7982 Long term (current) use of aspirin: Secondary | ICD-10-CM | POA: Diagnosis not present

## 2022-07-05 DIAGNOSIS — R55 Syncope and collapse: Secondary | ICD-10-CM | POA: Diagnosis present

## 2022-07-05 DIAGNOSIS — I2510 Atherosclerotic heart disease of native coronary artery without angina pectoris: Secondary | ICD-10-CM | POA: Diagnosis present

## 2022-07-05 DIAGNOSIS — Z7902 Long term (current) use of antithrombotics/antiplatelets: Secondary | ICD-10-CM | POA: Diagnosis not present

## 2022-07-05 DIAGNOSIS — E785 Hyperlipidemia, unspecified: Secondary | ICD-10-CM | POA: Diagnosis present

## 2022-07-05 DIAGNOSIS — I252 Old myocardial infarction: Secondary | ICD-10-CM | POA: Diagnosis not present

## 2022-07-05 DIAGNOSIS — Z87891 Personal history of nicotine dependence: Secondary | ICD-10-CM | POA: Diagnosis not present

## 2022-07-05 DIAGNOSIS — Z8673 Personal history of transient ischemic attack (TIA), and cerebral infarction without residual deficits: Secondary | ICD-10-CM | POA: Diagnosis not present

## 2022-07-05 DIAGNOSIS — I120 Hypertensive chronic kidney disease with stage 5 chronic kidney disease or end stage renal disease: Secondary | ICD-10-CM | POA: Diagnosis present

## 2022-07-05 DIAGNOSIS — Y841 Kidney dialysis as the cause of abnormal reaction of the patient, or of later complication, without mention of misadventure at the time of the procedure: Secondary | ICD-10-CM | POA: Diagnosis present

## 2022-07-05 DIAGNOSIS — N186 End stage renal disease: Secondary | ICD-10-CM | POA: Diagnosis present

## 2022-07-05 DIAGNOSIS — N2581 Secondary hyperparathyroidism of renal origin: Secondary | ICD-10-CM | POA: Diagnosis present

## 2022-07-05 DIAGNOSIS — I10 Essential (primary) hypertension: Secondary | ICD-10-CM | POA: Diagnosis not present

## 2022-07-05 DIAGNOSIS — Z79899 Other long term (current) drug therapy: Secondary | ICD-10-CM | POA: Diagnosis not present

## 2022-07-05 DIAGNOSIS — E7800 Pure hypercholesterolemia, unspecified: Secondary | ICD-10-CM | POA: Diagnosis not present

## 2022-07-05 DIAGNOSIS — Z8249 Family history of ischemic heart disease and other diseases of the circulatory system: Secondary | ICD-10-CM | POA: Diagnosis not present

## 2022-07-05 DIAGNOSIS — Z833 Family history of diabetes mellitus: Secondary | ICD-10-CM | POA: Diagnosis not present

## 2022-07-05 DIAGNOSIS — Z823 Family history of stroke: Secondary | ICD-10-CM | POA: Diagnosis not present

## 2022-07-05 DIAGNOSIS — D631 Anemia in chronic kidney disease: Secondary | ICD-10-CM | POA: Diagnosis present

## 2022-07-05 DIAGNOSIS — Z951 Presence of aortocoronary bypass graft: Secondary | ICD-10-CM | POA: Diagnosis not present

## 2022-07-05 DIAGNOSIS — E1122 Type 2 diabetes mellitus with diabetic chronic kidney disease: Secondary | ICD-10-CM | POA: Diagnosis present

## 2022-07-05 DIAGNOSIS — Z992 Dependence on renal dialysis: Secondary | ICD-10-CM | POA: Diagnosis not present

## 2022-07-05 LAB — GLUCOSE, CAPILLARY: Glucose-Capillary: 111 mg/dL — ABNORMAL HIGH (ref 70–99)

## 2022-07-05 LAB — CBC
HCT: 34.6 % — ABNORMAL LOW (ref 39.0–52.0)
Hemoglobin: 11.3 g/dL — ABNORMAL LOW (ref 13.0–17.0)
MCH: 31.7 pg (ref 26.0–34.0)
MCHC: 32.7 g/dL (ref 30.0–36.0)
MCV: 97.2 fL (ref 80.0–100.0)
Platelets: 222 10*3/uL (ref 150–400)
RBC: 3.56 MIL/uL — ABNORMAL LOW (ref 4.22–5.81)
RDW: 13.2 % (ref 11.5–15.5)
WBC: 5.5 10*3/uL (ref 4.0–10.5)
nRBC: 0 % (ref 0.0–0.2)

## 2022-07-05 LAB — ECHOCARDIOGRAM COMPLETE
AR max vel: 2.22 cm2
AV Area VTI: 2.26 cm2
AV Area mean vel: 2.23 cm2
AV Mean grad: 6.5 mmHg
AV Peak grad: 11.7 mmHg
Ao pk vel: 1.71 m/s
Area-P 1/2: 2.83 cm2
Height: 70 in
S' Lateral: 2 cm
Weight: 2027.2 oz

## 2022-07-05 LAB — COMPREHENSIVE METABOLIC PANEL
ALT: 17 U/L (ref 0–44)
AST: 28 U/L (ref 15–41)
Albumin: 3.3 g/dL — ABNORMAL LOW (ref 3.5–5.0)
Alkaline Phosphatase: 51 U/L (ref 38–126)
Anion gap: 13 (ref 5–15)
BUN: 50 mg/dL — ABNORMAL HIGH (ref 8–23)
CO2: 28 mmol/L (ref 22–32)
Calcium: 8.2 mg/dL — ABNORMAL LOW (ref 8.9–10.3)
Chloride: 94 mmol/L — ABNORMAL LOW (ref 98–111)
Creatinine, Ser: 8 mg/dL — ABNORMAL HIGH (ref 0.61–1.24)
GFR, Estimated: 7 mL/min — ABNORMAL LOW (ref >60)
Glucose, Bld: 181 mg/dL — ABNORMAL HIGH (ref 70–99)
Potassium: 5 mmol/L (ref 3.5–5.1)
Sodium: 135 mmol/L (ref 135–145)
Total Bilirubin: 0.5 mg/dL (ref 0.3–1.2)
Total Protein: 6.3 g/dL — ABNORMAL LOW (ref 6.5–8.1)

## 2022-07-05 LAB — HEPATITIS B SURFACE ANTIGEN: Hepatitis B Surface Ag: NONREACTIVE

## 2022-07-05 LAB — PHOSPHORUS: Phosphorus: 6 mg/dL — ABNORMAL HIGH (ref 2.5–4.6)

## 2022-07-05 MED ORDER — AMLODIPINE BESYLATE 2.5 MG PO TABS
2.5000 mg | ORAL_TABLET | Freq: Every day | ORAL | Status: DC
  Administered 2022-07-05 – 2022-07-06 (×2): 2.5 mg via ORAL
  Filled 2022-07-05 (×2): qty 1

## 2022-07-05 MED ORDER — HEPARIN SODIUM (PORCINE) 1000 UNIT/ML DIALYSIS: 2000.0000 [IU] | INTRAMUSCULAR | Status: DC | PRN

## 2022-07-05 MED ORDER — SODIUM CHLORIDE 0.9 % IV BOLUS
1000.0000 mL | Freq: Once | INTRAVENOUS | Status: AC
  Administered 2022-07-06: 1000 mL via INTRAVENOUS

## 2022-07-05 MED ORDER — DOXERCALCIFEROL 4 MCG/2ML IV SOLN: 3.0000 ug | INTRAVENOUS | Status: DC

## 2022-07-05 MED ORDER — HEPARIN SODIUM (PORCINE) 1000 UNIT/ML DIALYSIS: 6000.0000 [IU] | Freq: Once | INTRAMUSCULAR | Status: DC

## 2022-07-05 MED ORDER — CHLORHEXIDINE GLUCONATE CLOTH 2 % EX PADS
6.0000 | MEDICATED_PAD | Freq: Every day | CUTANEOUS | Status: DC
  Administered 2022-07-06: 6 via TOPICAL

## 2022-07-05 MED ORDER — HYDRALAZINE HCL 25 MG PO TABS
25.0000 mg | ORAL_TABLET | Freq: Three times a day (TID) | ORAL | Status: DC
  Administered 2022-07-05 – 2022-07-07 (×5): 25 mg via ORAL
  Filled 2022-07-05 (×5): qty 1

## 2022-07-05 NOTE — Progress Notes (Signed)
Carotid duplex has been completed.   Preliminary results in CV Proc.   Heidi Lemay Yolando Gillum 07/05/2022 11:21 AM

## 2022-07-05 NOTE — Progress Notes (Signed)
  Echocardiogram 2D Echocardiogram has been performed.  Ronny Flurry 07/05/2022, 1:17 PM

## 2022-07-05 NOTE — Consult Note (Signed)
Cardiology Consultation   Patient ID: Shawn Collins MRN: [id number]; DOB: Nov 10, 1950  Admit date: 07/04/2022 Date of Consult: 07/05/2022  PCP:  Nolene Ebbs, MD   [facility] Cardiologist:  Fransico Him, MD        Patient Profile:   Shawn Collins is a 72 y.o. male with a hx of ASCAD s/p NSTEMI, with cath showing severe 3VD s/p CABG X 3  ( LIMA to LAD, SVG to OM1/OM2 and SVG D1) in 2018, CKD on HD, DM, HTN, COPD, stroke and HLD who is being seen 07/05/2022 for the evaluation of syncope at the request of Dr. Moses Manners. .  History of Present Illness:   [name] with above hx with CABG 2018 ( LIMA to LAD, SVG to OM1/OM2 and SVG D1)  last echo 11/2021 with EF >75%, Mid cavitary obstruction with max instantaneous gradient of 32 mmHg. Flow acceration at the basal septum and mid cavity, RV is hyperdynamic RVSP is 23.8.  Mitral valve has a mild post inflammatory appearance with leaflet  excursion. The mitral valve is grossly normal. Trivial mitral valve  regurgitation. The echo was done for stroke due to small vessel disease.   Last visit with Dr. Radford Pax in 2021.    Pt presented to ER by EMS 07/04/22 with witnessed syncopal episode at dialysis center. He did have 3600 ml removed compared to his usual 2600 ml. Due to extra weight.   Pt denied chest pain or palpitations, no lower ext edema.   No chest pain in weeks leading up to event and no palpitations or dizziness.    Labs hs troponin 23 and 21 Na 135 K+ 5.0 glucose 181, BUN 50 Cr 8.0 Ca+ 8.2  WBC 4.8 to 5.5  Hgb 11.3 plts 222.   2V CXR NAD  Echo today with EF 70-75%, LV with hyperdynamic function no RWMA. Mild concentric LVH. Trivial MR, RV is normal,   Carotid dopplers 1-39% stenosis Bilaterally    BP in ER 201/94  rec'd amlodipine 5 mg and hydralazine 50 mg.   Orthostatic BP Lying 121/60 to standing 94/49 and standing at 3 min, 87/50.     Past Medical History:  Diagnosis Date   CAD (coronary artery disease), native  coronary artery    s/p NSTEMI with cath showing severe 3VD s/p  CABG x 3 with LIMA to LAD, SVG to OM1/OM2 and SVG D1 by Dr. Servando Snare on 10/26/16.   COPD (chronic obstructive pulmonary disease) ([place]) 09/10/2014   CVA (cerebral vascular accident) ([place]) 12/18/2021   Diabetes mellitus without complication ([place])    Type II   ESRD (end stage renal disease) ([place])    Hyperlipidemia LDL goal <70 04/17/2017   Hypertension     Past Surgical History:  Procedure Laterality Date   AV FISTULA PLACEMENT Left 12/21/2021   Procedure: INSERTION OF LEFT ARM ARTERIOVENOUS (AV) GORE-TEX GRAFT;  Surgeon: Angelia Mould, MD;  Location: [facility];  Service: Vascular;  Laterality: Left;   CORONARY ARTERY BYPASS GRAFT N/A 10/26/2016   Procedure: CORONARY ARTERY BYPASS GRAFTING (CABG) x 4 USING LEFT INTERNAL MAMMARY ARTERY TO LAD AND ENDOSCOPICALLY HARVESTED GREATER SAPHENOUS VEIN TO OM 1, 2 AND TO DIAG;  Surgeon: Grace Isaac, MD;  Location: [facility];  Service: Open Heart Surgery;  Laterality: N/A;   ENDOVEIN HARVEST OF GREATER SAPHENOUS VEIN Right 10/26/2016   Procedure: ENDOVEIN HARVEST OF GREATER SAPHENOUS VEIN;  Surgeon: Grace Isaac, MD;  Location: [facility];  Service: Open Heart Surgery;  Laterality: Right;   IR THORACENTESIS ASP PLEURAL SPACE W/IMG GUIDE  11/03/2016   LEFT HEART CATH AND CORONARY ANGIOGRAPHY N/A 10/21/2016   Procedure: Left Heart Cath and Coronary Angiography;  Surgeon: Martinique, Peter M, MD;  Location: [facility];  Service: Cardiovascular;  Laterality: N/A;   TEE WITHOUT CARDIOVERSION N/A 10/26/2016   Procedure: TRANSESOPHAGEAL ECHOCARDIOGRAM (TEE);  Surgeon: Grace Isaac, MD;  Location: [facility];  Service: Open Heart Surgery;  Laterality: N/A;     Home Medications:  Prior to Admission medications   Medication Sig Start Date End Date Taking? Authorizing Provider  amLODipine (NORVASC) 5 MG tablet Take 1 tablet (5 mg total) by mouth at bedtime. 12/28/21   de Yolanda Manges, Cortney E, NP   aspirin EC 81 MG tablet Take 1 tablet (81 mg total) by mouth daily. Swallow whole. Patient taking differently: Take 81 mg by mouth daily. 12/29/21   de Yolanda Manges, Cortney E, NP  atorvastatin (LIPITOR) 40 MG tablet Take 1 tablet (40 mg total) by mouth daily. Patient taking differently: Take 40 mg by mouth at bedtime. 12/29/21   de Yolanda Manges, Cortney E, NP  carvedilol (COREG) 12.5 MG tablet Take 1 tablet (12.5 mg total) by mouth 2 (two) times daily with a meal. 12/28/21   de Yolanda Manges, Cortney E, NP  clopidogrel (PLAVIX) 75 MG tablet Take 1 tablet (75 mg total) by mouth daily. 12/29/21   de Yolanda Manges, Cortney E, NP  doxercalciferol (HECTOROL) 4 MCG/2ML injection Inject 2 mLs (4 mcg total) into the vein every Monday, Wednesday, and Friday with hemodialysis. 12/28/21   de Yolanda Manges, Cortney E, NP  furosemide (LASIX) 40 MG tablet Take 40 mg by mouth daily.    [provider]  hydrALAZINE (APRESOLINE) 50 MG tablet Take 1 tablet (50 mg total) by mouth 3 (three) times daily. 12/28/21   de Yolanda Manges, Cortney E, NP  losartan (COZAAR) 25 MG tablet Take 25 mg by mouth daily. 08/11/21   [provider]  nitroGLYCERIN (NITROSTAT) 0.4 MG SL tablet Place 0.4 mg under the tongue every 5 (five) minutes as needed for chest pain.    [provider]  VELPHORO 500 MG chewable tablet Chew 500 mg by mouth 3 (three) times daily. 06/04/21   [provider]    Inpatient Medications: Scheduled Meds:  amLODipine  2.5 mg Oral QHS   aspirin EC  81 mg Oral Daily   atorvastatin  40 mg Oral QHS   carvedilol  12.5 mg Oral BID WC   [START ON 07/06/2022] Chlorhexidine Gluconate Cloth  6 each Topical Q0600   clopidogrel  75 mg Oral Daily   [START ON 07/06/2022] doxercalciferol  3 mcg Intravenous Q M,W,F-HD   hydrALAZINE  25 mg Oral TID   losartan  25 mg Oral Daily   sodium chloride flush  3 mL Intravenous Q12H   sucroferric oxyhydroxide  500 mg Oral TID WC   Continuous Infusions:  PRN Meds: acetaminophen  **OR** acetaminophen, nitroGLYCERIN, prochlorperazine  Allergies:   No Known Allergies  Social History:   Social History   Socioeconomic History   Marital status: Single    Spouse name: Not on file   Number of children: Not on file   Years of education: Not on file   Highest education level: Not on file  Occupational History   Occupation: retired  Tobacco Use   Smoking status: Former    Packs/day: 0.25    Years: 8.00  Total pack years: 2.00    Types: Cigarettes    Quit date: 41    Years since quitting: 52.1   Smokeless tobacco: Never  Vaping Use   Vaping Use: Never used  Substance and Sexual Activity   Alcohol use: Not Currently   Drug use: Not Currently   Sexual activity: Not on file  Other Topics Concern   Not on file  Social History Narrative   Not on file   Social Determinants of Health   Financial Resource Strain: Not on file  Food Insecurity: No Food Insecurity (07/05/2022)   Hunger Vital Sign    Worried About Running Out of Food in the Last Year: Never true    Ran Out of Food in the Last Year: Never true  Transportation Needs: No Transportation Needs (07/05/2022)   PRAPARE - Hydrologist (Medical): No    Lack of Transportation (Non-Medical): No  Physical Activity: Not on file  Stress: Not on file  Social Connections: Not on file  Intimate Partner Violence: Not At Risk (07/05/2022)   Humiliation, Afraid, Rape, and Kick questionnaire    Fear of Current or Ex-Partner: No    Emotionally Abused: No    Physically Abused: No    Sexually Abused: No    Family History:    Family History  Problem Relation Age of Onset   Diabetes Mellitus II Mother    Stroke Father    Hypertension Father    Diabetes Mellitus II Sister      ROS:  Please see the history of present illness.  General:no colds or fevers, no weight changes Skin:no rashes or ulcers HEENT:no blurred vision, no congestion CV:see HPI PUL:see HPI GI:no diarrhea  constipation or melena, no indigestion GU:no hematuria, no dysuria MS:no joint pain, no claudication Neuro:+ syncope, no lightheadedness Endo:+ diabetes, no thyroid disease  All other ROS reviewed and negative.     Physical Exam/Data:   Vitals:   07/04/22 2100 07/05/22 0020 07/05/22 0336 07/05/22 0900  BP: (!) 166/79 121/60 (!) 141/63 118/65  Pulse:  78 78 78  Resp:  18 18 18   Temp:  98.4 F (36.9 C) 98.4 F (36.9 C)   TempSrc:  Oral Oral   SpO2:  98% 97% 98%  Weight:  57.5 kg    Height:  5\' 10"  (1.778 m)      Intake/Output Summary (Last 24 hours) at 07/05/2022 1350 Last data filed at 07/05/2022 1324 Gross per 24 hour  Intake 240 ml  Output 1 ml  Net 239 ml      07/05/2022   12:20 AM 07/04/2022    1:49 PM 12/31/2021   12:34 PM  Last 3 Weights  Weight (lbs) 126 lb 11.2 oz 145 lb 119 lb 11.4 oz  Weight (kg) 57.471 kg 65.772 kg 54.3 kg     Body mass index is 18.18 kg/m.  General:  thin male in no acute distress HEENT: normal Neck: no JVD Vascular: No carotid bruits; Distal pulses 2+ bilaterally Cardiac:  normal S1, S2; RRR; soft 4-0/1 systolic murmur no gallup or rub Lungs:  clear to auscultation bilaterally, no wheezing, rhonchi or rales  Abd: soft, nontender, no hepatomegaly  Ext: no edema Musculoskeletal:  No deformities, BUE and BLE strength normal and equal Skin: warm and dry  Neuro: alert and oriented X 3 MAE follows commands, no focal abnormalities noted Psych:  Normal affect   EKG:  The EKG was personally reviewed and demonstrates:  SR borderline  QT no ST changes.   Telemetry:  Telemetry was personally reviewed and demonstrates:  SR  Relevant CV Studies: Echo today    1. Left ventricular ejection fraction, by estimation, is 70 to 75%. The  left ventricle has hyperdynamic function. The left ventricle has no  regional wall motion abnormalities. There is mild concentric left  ventricular hypertrophy. Left ventricular  diastolic parameters are indeterminate.    2. Right ventricular systolic function is normal. The right ventricular  size is normal.   3. The mitral valve is normal in structure. Trivial mitral valve  regurgitation. No evidence of mitral stenosis. Moderate mitral annular  calcification.   4. The aortic valve is tricuspid. There is mild calcification of the  aortic valve. Aortic valve regurgitation is not visualized. Aortic valve  sclerosis/calcification is present, without any evidence of aortic  stenosis.   5. The inferior vena cava is normal in size with greater than 50%  respiratory variability, suggesting right atrial pressure of 3 mmHg.   Comparison(s): No significant change from prior study. Prior images  reviewed side by side.   FINDINGS   Left Ventricle: Left ventricular ejection fraction, by estimation, is 70  to 75%. The left ventricle has hyperdynamic function. The left ventricle  has no regional wall motion abnormalities. 3D left ventricular ejection  fraction analysis performed but not   reported based on interpreter judgement due to suboptimal tracking. The  left ventricular internal cavity size was normal in size. There is mild  concentric left ventricular hypertrophy. Left ventricular diastolic  parameters are indeterminate.   Right Ventricle: The right ventricular size is normal. No increase in  right ventricular wall thickness. Right ventricular systolic function is  normal.   Left Atrium: Left atrial size was normal in size.   Right Atrium: Right atrial size was normal in size.   Pericardium: There is no evidence of pericardial effusion.   Mitral Valve: The mitral valve is normal in structure. There is mild  thickening of the anterior mitral valve leaflet(s). There is mild  calcification of the anterior mitral valve leaflet(s). Moderate mitral  annular calcification. Trivial mitral valve  regurgitation. No evidence of mitral valve stenosis.   Tricuspid Valve: The tricuspid valve is normal in  structure. Tricuspid  valve regurgitation is trivial.   Aortic Valve: The aortic valve is tricuspid. There is mild calcification  of the aortic valve. Aortic valve regurgitation is not visualized. Aortic  valve sclerosis/calcification is present, without any evidence of aortic  stenosis. Aortic valve mean  gradient measures 6.5 mmHg. Aortic valve peak gradient measures 11.7 mmHg.  Aortic valve area, by VTI measures 2.26 cm.   Pulmonic Valve: The pulmonic valve was not well visualized. Pulmonic valve  regurgitation is not visualized.   Aorta: The aortic root is normal in size and structure.   Venous: The inferior vena cava is normal in size with greater than 50%  respiratory variability, suggesting right atrial pressure of 3 mmHg.   IAS/Shunts: No atrial level shunt detected by color flow Doppler.    Echo 12/19/21   1. Mid cavitary obstruction with max instantaneous gradient of 32 mmHg.  Flow acceration at the basal septum and mid cavity.. Left ventricular  ejection fraction, by estimation, is >75%. The left ventricle has  hyperdynamic function. The left ventricle has   no regional wall motion abnormalities. Left ventricular diastolic  parameters are indeterminate.   2. Right ventricular systolic function is hyperdynamic. The right  ventricular size is normal. There is  normal pulmonary artery systolic  pressure. The estimated right ventricular systolic pressure is 62.8 mmHg.   3. Mitral valve has a mild post inflammatory appearance with leaflet  excursion. The mitral valve is grossly normal. Trivial mitral valve  regurgitation. No evidence of mitral stenosis.   4. The aortic valve is abnormal. There is moderate calcification of the  aortic valve. Aortic valve regurgitation is not visualized. Aortic valve  sclerosis/calcification is present, without any evidence of aortic  stenosis.   5. The inferior vena cava is normal in size with greater than 50%  respiratory variability,  suggesting right atrial pressure of 3 mmHg.   Conclusion(s)/Recommendation(s): No intracardiac source of embolism  detected on this transthoracic study. Consider a transesophageal  echocardiogram to exclude cardiac source of embolism if clinically  indicated.   FINDINGS   Left Ventricle: Mid cavitary obstruction with max instantaneous gradient  of 32 mmHg. Flow acceration at the basal septum and mid cavity. Left  ventricular ejection fraction, by estimation, is >75%. The left ventricle  has hyperdynamic function. The left  ventricle has no regional wall motion abnormalities. The left ventricular  internal cavity size was normal in size. There is no left ventricular  hypertrophy. Left ventricular diastolic parameters are indeterminate.   Right Ventricle: The right ventricular size is normal. No increase in  right ventricular wall thickness. Right ventricular systolic function is  hyperdynamic. There is normal pulmonary artery systolic pressure. The  tricuspid regurgitant velocity is 2.28  m/s, and with an assumed right atrial pressure of 3 mmHg, the estimated  right ventricular systolic pressure is 36.6 mmHg.   Left Atrium: Left atrial size was normal in size.   Right Atrium: Right atrial size was normal in size.   Pericardium: There is no evidence of pericardial effusion.   Mitral Valve: Mitral valve has a mild post inflammatory appearance with  leaflet excursion. The mitral valve is grossly normal. Trivial mitral  valve regurgitation. No evidence of mitral valve stenosis.   Tricuspid Valve: The tricuspid valve is normal in structure. Tricuspid  valve regurgitation is mild . No evidence of tricuspid stenosis.   Aortic Valve: The aortic valve is abnormal. There is moderate  calcification of the aortic valve. Aortic valve regurgitation is not  visualized. Aortic valve sclerosis/calcification is present, without any  evidence of aortic stenosis.   Pulmonic Valve: The pulmonic  valve was normal in structure. Pulmonic valve  regurgitation is not visualized. No evidence of pulmonic stenosis.   Aorta: The aortic root is normal in size and structure.   Venous: The inferior vena cava is normal in size with greater than 50%  respiratory variability, suggesting right atrial pressure of 3 mmHg.   IAS/Shunts: The atrial septum is grossly normal.    Laboratory Data:  High Sensitivity Troponin:   Recent Labs  Lab 07/04/22 1418 07/04/22 1801  TROPONINIHS 23* 21*     Chemistry Recent Labs  Lab 07/04/22 1418 07/05/22 0058  NA 138 135  K 4.4 5.0  CL 93* 94*  CO2 28 28  GLUCOSE 105* 181*  BUN 36* 50*  CREATININE 6.61* 8.00*  CALCIUM 8.5* 8.2*  GFRNONAA 8* 7*  ANIONGAP 17* 13    Recent Labs  Lab 07/04/22 1418 07/05/22 0058  PROT 7.3 6.3*  ALBUMIN 3.8 3.3*  AST 30 28  ALT 19 17  ALKPHOS 58 51  BILITOT 0.4 0.5   Lipids No results for input(s): "CHOL", "TRIG", "HDL", "LABVLDL", "LDLCALC", "CHOLHDL" in the last 168 hours.  Hematology Recent Labs  Lab 07/04/22 1418 07/05/22 0058  WBC 4.8 5.5  RBC 3.91* 3.56*  HGB 12.5* 11.3*  HCT 38.3* 34.6*  MCV 98.0 97.2  MCH 32.0 31.7  MCHC 32.6 32.7  RDW 13.2 13.2  PLT 226 222   Thyroid No results for input(s): "TSH", "FREET4" in the last 168 hours.  BNPNo results for input(s): "BNP", "PROBNP" in the last 168 hours.  DDimer No results for input(s): "DDIMER" in the last 168 hours.   Radiology/Studies:  VAS US CAROTID  Result Date: 07/05/2022 Carotid Arterial Duplex Study Patient Name:  JAYDEN KRATOCHVIL  Date of Exam:   07/05/2022 Medical Rec #: [id number]      Accession #:    [id number] Date of Birth: 1950/09/21      Patient Gender: M Patient Age:   25 years Exam [facility]  [facility] Procedure:      VAS US CAROTID Referring Phys: DAVID ORTIZ --------------------------------------------------------------------------------  Indications:       Syncope. Risk Factors:      Hypertension, hyperlipidemia,  Diabetes, coronary artery                    disease, prior CVA. Comparison Study:  no prior Performing Technologist: Archie Patten RVS  Examination Guidelines: A complete evaluation includes B-mode imaging, spectral Doppler, color Doppler, and power Doppler as needed of all accessible portions of each vessel. Bilateral testing is considered an integral part of a complete examination. Limited examinations for reoccurring indications may be performed as noted.  Right Carotid Findings: +----------+--------+--------+--------+------------------+--------+           PSV cm/sEDV cm/sStenosisPlaque DescriptionComments +----------+--------+--------+--------+------------------+--------+ CCA Prox  143     14              heterogenous               +----------+--------+--------+--------+------------------+--------+ CCA Distal83      11              heterogenous               +----------+--------+--------+--------+------------------+--------+ ICA Prox  79      19      1-39%   heterogenous               +----------+--------+--------+--------+------------------+--------+ ICA Distal63      21                                         +----------+--------+--------+--------+------------------+--------+ ECA       157                                                +----------+--------+--------+--------+------------------+--------+ +----------+--------+-------+--------+-------------------+           PSV cm/sEDV cmsDescribeArm Pressure (mmHG) +----------+--------+-------+--------+-------------------+ Subclavian206                                        +----------+--------+-------+--------+-------------------+ +---------+--------+--+--------+--+---------+ VertebralPSV cm/s62EDV cm/s11Antegrade +---------+--------+--+--------+--+---------+  Left Carotid Findings: +----------+--------+--------+--------+------------------+--------+           PSV cm/sEDV cm/sStenosisPlaque  DescriptionComments +----------+--------+--------+--------+------------------+--------+ CCA Prox  132     16  heterogenous               +----------+--------+--------+--------+------------------+--------+ CCA Distal91      13              heterogenous               +----------+--------+--------+--------+------------------+--------+ ICA Prox  72      19      1-39%   heterogenous               +----------+--------+--------+--------+------------------+--------+ ICA Distal64      20                                         +----------+--------+--------+--------+------------------+--------+ ECA       102                                                +----------+--------+--------+--------+------------------+--------+ +----------+--------+--------+--------+-------------------+           PSV cm/sEDV cm/sDescribeArm Pressure (mmHG) +----------+--------+--------+--------+-------------------+ [id number]     57                                  +----------+--------+--------+--------+-------------------+ +---------+--------+--+--------+---------+ VertebralPSV cm/s53EDV cm/sAntegrade +---------+--------+--+--------+---------+   Summary: Right Carotid: Velocities in the right ICA are consistent with a 1-39% stenosis. Left Carotid: Velocities in the left ICA are consistent with a 1-39% stenosis. Vertebrals: Bilateral vertebral arteries demonstrate antegrade flow. *See table(s) above for measurements and observations.     Preliminary    X-ray chest PA and lateral  Result Date: 07/04/2022 CLINICAL DATA:  Syncopal episode today.  Dialysis patient. EXAM: CHEST - 2 VIEW COMPARISON:  12/30/2021 FINDINGS: Previous median sternotomy and CABG. Heart size is normal. The lungs are clear. The vascularity is normal. No edema or effusions. No abnormal bone finding. IMPRESSION: Previous CABG. No active disease. Electronically Signed   By: Nelson Chimes M.D.   On: 07/04/2022  19:11     Assessment and Plan:   Syncope after pulling an extra L off in dialysis. He was in OP HD unit per Dr. Jonnie Finner "Had 4kg on, usual UF goal is 2-3kg. Could have been just pulling too much fluid too fast for his body to tolerate, this is common in elderly ESRD pts. Another cause could be lean body weight gain, causing [place] to pull too much fluid because they need an increase in their dry weight (his CXR's dating back to 2018 show no signs of CHF in his history) "  BP meds could be reason he is orthostatic.  Neg troponin. No chest pain. No arrhythmias on tele.  Echo stable with hyperdynamic LV.  No ischemic work up needed. CAD with hx CABG in 2018 ( LIMA to LAD, SVG to OM1/OM2 and SVG D1) no ischemic studies since surgery but no angina.  ESRD on HD MWF Per nephrology Hx CVA 11/2021.  DM2/COPD/HTN/Anemia per IM   Risk Assessment/Risk Scores:                For questions or updates, please contact [place] Please consult [email or web address] for contact info under    Signed, Cecilie Kicks, NP  07/05/2022 1:50 PM

## 2022-07-05 NOTE — Consult Note (Addendum)
Renal Service Consult Note [facility]  Shawn Collins 07/05/2022 Shawn Blazing, MD Requesting Physician: Dr. Feliberto Collins  Reason for Consult: ESRD pt w/ syncope  HPI: The patient is a 72 y.o. year-old w/ PMH as below who presented to ED for syncopal episode which happened late in dialysis session yesterday. In ED Hb 12, wbc 4K, trop 23, nomral LFT's, creat 6.6, Ca 8.6. BP 200 /94, HR 69, afeb. , not hypoxic. Pt rec'd po norvasc and hydralazine and was admitted. We are asked to see for ESRD.    Pt seen in room. I don't think he really remembers the event very well, could not give much detail about it.  Denies prior syncope on dialysis, has been on HD for about 2 years. Denies any sig wt gain signs such as tight-fitting pants/ clothes. Denies any n/v/d, SOB or CP today. Pt lives in a boarding house w/ other people. A lady friend from church helps him out when needed.   Hospital admissions --->     Jan 2012 - COPD, DM, HTN admit for pyloric ulceration w/ stenosis causing gastric outlet obstruction. N/V improved and did not require any surgery   April 2016 - N/V due to viral GE, a/c renal failure, DM2 uncont, COPD okay, HTN   June 2018 - admit for N/V, malaise, fatigue --> trops were up, LHC showed 3V CAD and pt went for CABG x 4 w/ some vol overload/ CKD causing need for diuresis post op. DC'd to SNF.     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 Went to SNF at [facility].     Aug 3- 4, 2023 - admitted for hypoglycemia causing AMS. Recovered rapidly and was dc'd.     ROS - denies CP, no joint pain, no HA, no blurry vision, no rash, no diarrhea, no nausea/ vomiting, no dysuria, no difficulty voiding   Past Medical History  Past Medical History:  Diagnosis Date   CAD (coronary artery disease), native coronary artery    s/p NSTEMI with cath showing severe 3VD s/p  CABG x 3 with LIMA to LAD, SVG to OM1/OM2 and SVG D1 by Dr. Servando Collins on 10/26/16.    COPD (chronic obstructive pulmonary disease) ([place]) 09/10/2014   CVA (cerebral vascular accident) ([place]) 12/18/2021   Diabetes mellitus without complication (HCC)    Type II   ESRD (end stage renal disease) ([place])    Hyperlipidemia LDL goal <70 04/17/2017   Hypertension    Past Surgical History  Past Surgical History:  Procedure Laterality Date   AV FISTULA PLACEMENT Left 12/21/2021   Procedure: INSERTION OF LEFT ARM ARTERIOVENOUS (AV) GORE-TEX GRAFT;  Surgeon: Shawn Mould, MD;  Location: [place];  Service: Vascular;  Laterality: Left;   CORONARY ARTERY BYPASS GRAFT N/A 10/26/2016   Procedure: CORONARY ARTERY BYPASS GRAFTING (CABG) x 4 USING LEFT INTERNAL MAMMARY ARTERY TO LAD AND ENDOSCOPICALLY HARVESTED GREATER SAPHENOUS VEIN TO OM 1, 2 AND TO DIAG;  Surgeon: Shawn Isaac, MD;  Location: [place];  Service: Open Heart Surgery;  Laterality: N/A;   ENDOVEIN HARVEST OF GREATER SAPHENOUS VEIN Right 10/26/2016   Procedure: ENDOVEIN HARVEST OF GREATER SAPHENOUS VEIN;  Surgeon: Shawn Isaac, MD;  Location: [place];  Service: Open Heart Surgery;  Laterality: Right;   IR THORACENTESIS ASP PLEURAL SPACE W/IMG GUIDE  11/03/2016   LEFT HEART CATH AND CORONARY ANGIOGRAPHY N/A 10/21/2016   Procedure: Left Heart Cath and  Coronary Angiography;  Surgeon: Collins, Shawn M, MD;  Location: [place] [facility];  Service: Cardiovascular;  Laterality: N/A;   TEE WITHOUT CARDIOVERSION N/A 10/26/2016   Procedure: TRANSESOPHAGEAL ECHOCARDIOGRAM (TEE);  Surgeon: Shawn Isaac, MD;  Location: [place];  Service: Open Heart Surgery;  Laterality: N/A;   Family History  Family History  Problem Relation Age of Onset   Diabetes Mellitus II Mother    Stroke Father    Hypertension Father    Diabetes Mellitus II Sister    Social History  reports that he quit smoking about 52 years ago. His smoking use included cigarettes. He has a 2.00 pack-year smoking history. He has never used smokeless tobacco. He reports  that he does not currently use alcohol. He reports that he does not currently use drugs. Allergies No Known Allergies Home medications Prior to Admission medications   Medication Sig Start Date End Date Taking? Authorizing Provider  amLODipine (NORVASC) 5 MG tablet Take 1 tablet (5 mg total) by mouth at bedtime. 12/28/21   de Yolanda Manges, Cortney E, NP  aspirin EC 81 MG tablet Take 1 tablet (81 mg total) by mouth daily. Swallow whole. Patient taking differently: Take 81 mg by mouth daily. 12/29/21   de Yolanda Manges, Cortney E, NP  atorvastatin (LIPITOR) 40 MG tablet Take 1 tablet (40 mg total) by mouth daily. Patient taking differently: Take 40 mg by mouth at bedtime. 12/29/21   de Yolanda Manges, Cortney E, NP  carvedilol (COREG) 12.5 MG tablet Take 1 tablet (12.5 mg total) by mouth 2 (two) times daily with a meal. 12/28/21   de Yolanda Manges, Cortney E, NP  clopidogrel (PLAVIX) 75 MG tablet Take 1 tablet (75 mg total) by mouth daily. 12/29/21   de Yolanda Manges, Cortney E, NP  doxercalciferol (HECTOROL) 4 MCG/2ML injection Inject 2 mLs (4 mcg total) into the vein every Monday, Wednesday, and Friday with hemodialysis. 12/28/21   de Yolanda Manges, Cortney E, NP  furosemide (LASIX) 40 MG tablet Take 40 mg by mouth daily.    [provider]  hydrALAZINE (APRESOLINE) 50 MG tablet Take 1 tablet (50 mg total) by mouth 3 (three) times daily. 12/28/21   de Yolanda Manges, Cortney E, NP  losartan (COZAAR) 25 MG tablet Take 25 mg by mouth daily. 08/11/21   [provider]  nitroGLYCERIN (NITROSTAT) 0.4 MG SL tablet Place 0.4 mg under the tongue every 5 (five) minutes as needed for chest pain.    [provider]  VELPHORO 500 MG chewable tablet Chew 500 mg by mouth 3 (three) times daily. 06/04/21   [provider]     Vitals:   07/04/22 2100 07/05/22 0020 07/05/22 0336 07/05/22 0900  BP: (!) 166/79 121/60 (!) 141/63 118/65  Pulse:  78 78 78  Resp:  18 18 18   Temp:  98.4 F (36.9 C) 98.4 F (36.9 C)   TempSrc:   Oral Oral   SpO2:  98% 97% 98%  Weight:  57.5 kg    Height:  5\' 10"  (1.778 m)     Exam Gen alert, no distress, small -framed No rash, cyanosis or gangrene Sclera anicteric, throat clear  No jvd or bruits, flat neck veins Chest clear bilat to bases, no rales/ wheezing RRR no MRG Abd soft ntnd no mass or ascites +bs GU normal male MS no joint effusions or deformity Ext no LE or UE edema, no wounds or ulcers Neuro is alert, Ox 3 , nf  LFA AVG+bruit   Home meds include - norvasc 5, asa, liptor, coreg 12.5 bid, plavix , hydralazine 50 tid, losartan 25 qd, sl ntg, velphoro 500 mg ac tid, prns/ vits/ supps     OP HD: MWF [facility] 3h 77min   400/800   56.2kg  2/2.5 bath   AVG  Hep 8000  - last HD post wt 57.7kg on 2/5 - hectorol 3 mcg IV tiw - venofer 50 q wk - mircera 50 mcg q 4 wks, last 1/17, due 2/14    CXR - hx of CABG, normal heart shadow, no acute disease      Assessment/ Plan: Syncope - happened late in dialysis yesterday at this OP HD unit. Had 4kg on, usual UF goal is 2-3kg. Could have been just pulling too much fluid too fast for his body to tolerate, this is common in elderly ESRD pts. Another cause could be lean body weight gain, causing [place] to pull too much fluid because they need an increase in their dry weight (his CXR's dating back to 2018 show no signs of CHF in his history). Also could be related to BP meds, other causes. He is at his dry wt today and was orthostatic w/ BP testing here. He looks a bit dry on exam, will give 1 L NS 0.9% overnight. Will not pull fluid w/ HD tomorrow, let weights come up a bit as needed.  ESRD - on HD MWF. HD tomorrow.  HTN/ volume - as above, cautious use of BP lowering meds. Have added hold orders to all 4 bp lowering meds given soft BP's this am.  Anemia esrd - Hb 11-13 here, next esa due on 2/14. Follow.  MBD ckd - CCa in range, add on phos. Cont IV vdra w/ HD tiw. Cont velphoro 500mg  ac tid as binder.  DM2  COPD H/o CVA CAD/ hx of  CABG 2018      Rob Briannie Gutierrez  MD 07/05/2022, 12:02 PM Recent Labs  Lab 07/04/22 1418 07/05/22 0058  HGB 12.5* 11.3*  ALBUMIN 3.8 3.3*  CALCIUM 8.5* 8.2*  CREATININE 6.61* 8.00*  K 4.4 5.0   Inpatient medications:  amLODipine  5 mg Oral QHS   aspirin EC  81 mg Oral Daily   atorvastatin  40 mg Oral QHS   carvedilol  12.5 mg Oral BID WC   clopidogrel  75 mg Oral Daily   hydrALAZINE  50 mg Oral TID   losartan  25 mg Oral Daily   sodium chloride flush  3 mL Intravenous Q12H   sucroferric oxyhydroxide  500 mg Oral TID WC    acetaminophen **OR** acetaminophen, nitroGLYCERIN, prochlorperazine

## 2022-07-05 NOTE — TOC Progression Note (Signed)
Transition of Care [facility]) - Progression Note    Patient Details  Name: Shawn Collins MRN: [id number] Date of Birth: August 22, 1950  Transition of [facility]) CM/SW Contact  Zenon Mayo, RN Phone Number: 07/05/2022, 2:04 PM  Clinical Narrative:    NCM spoke with patient at the bedside, he states he lives in a boarding home, he has a cane/a walker that he does not use, he works part-time.  He states a friend, Kelle Darting will transport him home at dc.  He takes a bus to dialysis and sometimes he drives.  TOC following.         Expected Discharge Plan and Services                                               Social Determinants of Health (SDOH) Interventions SDOH Screenings   Food Insecurity: No Food Insecurity (07/05/2022)  Housing: Low Risk  (07/05/2022)  Transportation Needs: No Transportation Needs (07/05/2022)  Utilities: Not At Risk (07/05/2022)  Tobacco Use: Medium Risk (07/04/2022)    Readmission Risk Interventions     No data to display

## 2022-07-05 NOTE — Progress Notes (Signed)
   07/05/22 0020  Orthostatic Lying   BP- Lying 121/60  Pulse- Lying 78  Orthostatic Sitting  BP- Sitting 99/58  Pulse- Sitting 84  Orthostatic Standing at 0 minutes  BP- Standing at 0 minutes 94/49  Pulse- Standing at 0 minutes 88  Orthostatic Standing at 3 minutes  BP- Standing at 3 minutes (!) 87/50  Pulse- Standing at 3 minutes 86

## 2022-07-05 NOTE — Plan of Care (Signed)
  Problem: Health Behavior/Discharge Planning: Goal: Ability to manage health-related needs will improve Outcome: Progressing   Problem: Clinical Measurements: Goal: Ability to maintain clinical measurements within normal limits will improve Outcome: Progressing   

## 2022-07-05 NOTE — Care Management Obs Status (Signed)
[facility] NOTIFICATION   Patient Details  Name: Shawn Collins MRN: [id number] Date of Birth: May 01, 1951   Medicare Observation Status Notification Given:  Yes    Zenon Mayo, RN 07/05/2022, 1:56 PM

## 2022-07-05 NOTE — Progress Notes (Signed)
  Progress Note   Patient: Shawn Collins KNL:[id number] DOB: May 19, 1951 DOA: 07/04/2022     0 DOS: the patient was seen and examined on 07/05/2022   Brief hospital course:  Assessment and Plan:  Syncope/collapse - ECHO pending - Carotid doppler U/S pending   COPD  - Monitor   HTN - Norvasc 2.5 mg PO daily - Coreg 12.5 mg PO bid  - Hydralazine 25 mg PO tid  - Losartan 25 mg PO daily   HLD - Lipitor 40 mg PO daily   CAD - ASA 81 mg PO daily  - Lipitor 40 mg PO daily  - Plavix 75 mg PO daily   ESRD on HD with anemia  - Dialysis per nephrology  - Velphoro 500 mg PO tid   DM2 - ASA 81 mg PO daily  - Lipitor 40 mg PO daily   DVT prophylaxis: SCDs     Subjective: Pt seen and examined at the bedside. Appreciate nephrology assistance. ECHO and carotid U/S reports are pending. Pt orthostatic today. Pt is planned for dialysis tmr.  Physical Exam: Vitals:   07/04/22 2100 07/05/22 0020 07/05/22 0336 07/05/22 0900  BP: (!) 166/79 121/60 (!) 141/63 118/65  Pulse:  78 78 78  Resp:  18 18 18   Temp:  98.4 F (36.9 C) 98.4 F (36.9 C)   TempSrc:  Oral Oral   SpO2:  98% 97% 98%  Weight:  57.5 kg    Height:  5\' 10"  (1.778 m)     Physical Exam Constitutional:      Appearance: Normal appearance.  HENT:     Head: Normocephalic and atraumatic.  Cardiovascular:     Rate and Rhythm: Normal rate and regular rhythm.  Pulmonary:     Effort: Pulmonary effort is normal.  Abdominal:     General: Abdomen is flat.  Musculoskeletal:     Cervical back: Neck supple.  Skin:    General: Skin is warm.  Neurological:     Mental Status: He is alert. Mental status is at baseline.  Psychiatric:        Mood and Affect: Mood normal.    Data Reviewed:   Disposition: Status is: Inpatient  Planned Discharge Destination: Home    Time spent: 35 minutes  Author: Lucienne Minks , MD 07/05/2022 1:56 PM  For on call review [email or web address].

## 2022-07-05 NOTE — Progress Notes (Signed)
Heart Failure Navigator Progress Note  Assessed for Heart & Vascular TOC clinic readiness.  Patient does not meet criteria due to ESRD on hemodialysis.   Navigator will sign off at this time.    Josemaria Brining, BSN, RN Heart Failure Nurse Navigator Secure Chat Only   

## 2022-07-06 DIAGNOSIS — I10 Essential (primary) hypertension: Secondary | ICD-10-CM

## 2022-07-06 DIAGNOSIS — I2583 Coronary atherosclerosis due to lipid rich plaque: Secondary | ICD-10-CM

## 2022-07-06 DIAGNOSIS — I2510 Atherosclerotic heart disease of native coronary artery without angina pectoris: Secondary | ICD-10-CM | POA: Diagnosis not present

## 2022-07-06 DIAGNOSIS — E7800 Pure hypercholesterolemia, unspecified: Secondary | ICD-10-CM | POA: Diagnosis not present

## 2022-07-06 DIAGNOSIS — Z992 Dependence on renal dialysis: Secondary | ICD-10-CM

## 2022-07-06 DIAGNOSIS — N186 End stage renal disease: Secondary | ICD-10-CM

## 2022-07-06 DIAGNOSIS — R55 Syncope and collapse: Secondary | ICD-10-CM | POA: Diagnosis not present

## 2022-07-06 LAB — CBC
HCT: 33.3 % — ABNORMAL LOW (ref 39.0–52.0)
Hemoglobin: 11.2 g/dL — ABNORMAL LOW (ref 13.0–17.0)
MCH: 32.2 pg (ref 26.0–34.0)
MCHC: 33.6 g/dL (ref 30.0–36.0)
MCV: 95.7 fL (ref 80.0–100.0)
Platelets: 214 10*3/uL (ref 150–400)
RBC: 3.48 MIL/uL — ABNORMAL LOW (ref 4.22–5.81)
RDW: 13.2 % (ref 11.5–15.5)
WBC: 5.2 10*3/uL (ref 4.0–10.5)
nRBC: 0 % (ref 0.0–0.2)

## 2022-07-06 LAB — COMPREHENSIVE METABOLIC PANEL
ALT: 20 U/L (ref 0–44)
AST: 29 U/L (ref 15–41)
Albumin: 3.1 g/dL — ABNORMAL LOW (ref 3.5–5.0)
Alkaline Phosphatase: 47 U/L (ref 38–126)
Anion gap: 10 (ref 5–15)
BUN: 76 mg/dL — ABNORMAL HIGH (ref 8–23)
CO2: 28 mmol/L (ref 22–32)
Calcium: 8.3 mg/dL — ABNORMAL LOW (ref 8.9–10.3)
Chloride: 93 mmol/L — ABNORMAL LOW (ref 98–111)
Creatinine, Ser: 9.75 mg/dL — ABNORMAL HIGH (ref 0.61–1.24)
GFR, Estimated: 5 mL/min — ABNORMAL LOW (ref >60)
Glucose, Bld: 139 mg/dL — ABNORMAL HIGH (ref 70–99)
Potassium: 5.2 mmol/L — ABNORMAL HIGH (ref 3.5–5.1)
Sodium: 131 mmol/L — ABNORMAL LOW (ref 135–145)
Total Bilirubin: 0.4 mg/dL (ref 0.3–1.2)
Total Protein: 6 g/dL — ABNORMAL LOW (ref 6.5–8.1)

## 2022-07-06 LAB — GLUCOSE, CAPILLARY: Glucose-Capillary: 92 mg/dL (ref 70–99)

## 2022-07-06 LAB — MAGNESIUM: Magnesium: 2.5 mg/dL — ABNORMAL HIGH (ref 1.7–2.4)

## 2022-07-06 LAB — HEPATITIS B SURFACE ANTIBODY, QUANTITATIVE: Hep B S AB Quant (Post): 923.8 m[IU]/mL (ref >9.9)

## 2022-07-06 LAB — PHOSPHORUS: Phosphorus: 6.4 mg/dL — ABNORMAL HIGH (ref 2.5–4.6)

## 2022-07-06 LAB — C-REACTIVE PROTEIN: CRP: 0.7 mg/dL (ref <1.0)

## 2022-07-06 MED ORDER — METOPROLOL SUCCINATE ER 50 MG PO TB24: 50.0000 mg | ORAL_TABLET | Freq: Every day | ORAL | Status: DC

## 2022-07-06 NOTE — Progress Notes (Signed)
[name] Progress Note   Subjective:  Seen at onset of HD - no UF planned today, felt that was dry and we have allowed his volume to come up a bit - will have higher dry weight on discharge. Appreciate cardiology evaluation and med adjustments - coreg was changed to metop succinate - will follow BP.  Objective Vitals:   07/06/22 0842 07/06/22 0903 07/06/22 0930 07/06/22 1000  BP: 120/65 126/61 (!) 148/58 139/64  Pulse: (!) 58 (!) 57 (!) 59 60  Resp: 11 13 17 18   Temp: 97.8 F (36.6 C)     TempSrc:      SpO2: 99%   99%  Weight: 59.3 kg     Height:       Physical Exam General: Well appearing man, NAD. Room air Heart: RRR; no murmur Lungs: CTAB; no rales Abdomen: soft Extremities: no LE edema Dialysis Access: L AVG + bruit  Additional Objective Labs: Basic Metabolic Panel: Recent Labs  Lab 07/04/22 1418 07/05/22 0058 07/06/22 0057  NA 138 135 131*  K 4.4 5.0 5.2*  CL 93* 94* 93*  CO2 28 28 28   GLUCOSE 105* 181* 139*  BUN 36* 50* 76*  CREATININE 6.61* 8.00* 9.75*  CALCIUM 8.5* 8.2* 8.3*  PHOS  --  6.0* 6.4*   Liver Function Tests: Recent Labs  Lab 07/04/22 1418 07/05/22 0058 07/06/22 0057  AST 30 28 29   ALT 19 17 20   ALKPHOS 58 51 47  BILITOT 0.4 0.5 0.4  PROT 7.3 6.3* 6.0*  ALBUMIN 3.8 3.3* 3.1*   CBC: Recent Labs  Lab 07/04/22 1418 07/05/22 0058 07/06/22 0057  WBC 4.8 5.5 5.2  NEUTROABS 3.2  --   --   HGB 12.5* 11.3* 11.2*  HCT 38.3* 34.6* 33.3*  MCV 98.0 97.2 95.7  PLT 226 222 214   Studies/Results: VAS US CAROTID  Result Date: 07/05/2022 Carotid Arterial Duplex Study Patient Name:  Shawn Collins  Date of Exam:   07/05/2022 Medical Rec #: [id number]      Accession #:    [id number] Date of Birth: 05-May-1951      Patient Gender: M Patient Age:   73 years Exam Location:  [facility] Procedure:      VAS US CAROTID Referring Phys: DAVID ORTIZ --------------------------------------------------------------------------------   Indications:       Syncope. Risk Factors:      Hypertension, hyperlipidemia, Diabetes, coronary artery                    disease, prior CVA. Comparison Study:  no prior Performing Technologist: Archie Patten RVS  Examination Guidelines: A complete evaluation includes B-mode imaging, spectral Doppler, color Doppler, and power Doppler as needed of all accessible portions of each vessel. Bilateral testing is considered an integral part of a complete examination. Limited examinations for reoccurring indications may be performed as noted.  Right Carotid Findings: +----------+--------+--------+--------+------------------+--------+           PSV cm/sEDV cm/sStenosisPlaque DescriptionComments +----------+--------+--------+--------+------------------+--------+ CCA Prox  143     14              heterogenous               +----------+--------+--------+--------+------------------+--------+ CCA Distal83      11              heterogenous               +----------+--------+--------+--------+------------------+--------+ ICA Prox  79      19  1-39%   heterogenous               +----------+--------+--------+--------+------------------+--------+ ICA Distal63      21                                         +----------+--------+--------+--------+------------------+--------+ ECA       157                                                +----------+--------+--------+--------+------------------+--------+ +----------+--------+-------+--------+-------------------+           PSV cm/sEDV cmsDescribeArm Pressure (mmHG) +----------+--------+-------+--------+-------------------+ Subclavian206                                        +----------+--------+-------+--------+-------------------+ +---------+--------+--+--------+--+---------+ VertebralPSV cm/s62EDV cm/s11Antegrade +---------+--------+--+--------+--+---------+  Left Carotid Findings:  +----------+--------+--------+--------+------------------+--------+           PSV cm/sEDV cm/sStenosisPlaque DescriptionComments +----------+--------+--------+--------+------------------+--------+ CCA Prox  132     16              heterogenous               +----------+--------+--------+--------+------------------+--------+ CCA Distal91      13              heterogenous               +----------+--------+--------+--------+------------------+--------+ ICA Prox  72      19      1-39%   heterogenous               +----------+--------+--------+--------+------------------+--------+ ICA Distal64      20                                         +----------+--------+--------+--------+------------------+--------+ ECA       102                                                +----------+--------+--------+--------+------------------+--------+ +----------+--------+--------+--------+-------------------+           PSV cm/sEDV cm/sDescribeArm Pressure (mmHG) +----------+--------+--------+--------+-------------------+ [id number]     57                                  +----------+--------+--------+--------+-------------------+ +---------+--------+--+--------+---------+ VertebralPSV cm/s53EDV cm/sAntegrade +---------+--------+--+--------+---------+   Summary: Right Carotid: Velocities in the right ICA are consistent with a 1-39% stenosis. Left Carotid: Velocities in the left ICA are consistent with a 1-39% stenosis. Vertebrals: Bilateral vertebral arteries demonstrate antegrade flow. *See table(s) above for measurements and observations.  Electronically signed by Harold Barban MD on 07/05/2022 at 11:48:42 PM.    Final    ECHOCARDIOGRAM COMPLETE  Result Date: 07/05/2022    ECHOCARDIOGRAM REPORT   Patient Name:   Shawn Collins Date of Exam: 07/05/2022 Medical Rec #:  [id number]     Height:       70.0 in Accession #:    [id number]    Weight:  126.7 lb Date of Birth:   1950/08/21     BSA:          1.719 m Patient Age:    63 years      BP:           118/65 mmHg Patient Gender: M             HR:           68 bpm. Exam Location:  Inpatient Procedure: 2D Echo, Cardiac Doppler and Color Doppler Indications:    Syncope R55  History:        Patient has prior history of Echocardiogram examinations, most                 recent 12/19/2021. Previous Myocardial Infarction and CAD, Stroke                 and COPD, Signs/Symptoms:Dyspnea and Syncope; Risk                 Factors:Hypertension, Diabetes and Dyslipidemia. ESRD.  Sonographer:    Ronny Flurry Referring Phys: [id number] DAVID MANUEL [place]  1. Left ventricular ejection fraction, by estimation, is 70 to 75%. The left ventricle has hyperdynamic function. The left ventricle has no regional wall motion abnormalities. There is mild concentric left ventricular hypertrophy. Left ventricular diastolic parameters are indeterminate.  2. Right ventricular systolic function is normal. The right ventricular size is normal.  3. The mitral valve is normal in structure. Trivial mitral valve regurgitation. No evidence of mitral stenosis. Moderate mitral annular calcification.  4. The aortic valve is tricuspid. There is mild calcification of the aortic valve. Aortic valve regurgitation is not visualized. Aortic valve sclerosis/calcification is present, without any evidence of aortic stenosis.  5. The inferior vena cava is normal in size with greater than 50% respiratory variability, suggesting right atrial pressure of 3 mmHg. Comparison(s): No significant change from prior study. Prior images reviewed side by side. FINDINGS  Left Ventricle: Left ventricular ejection fraction, by estimation, is 70 to 75%. The left ventricle has hyperdynamic function. The left ventricle has no regional wall motion abnormalities. 3D left ventricular ejection fraction analysis performed but not  reported based on interpreter judgement due to suboptimal  tracking. The left ventricular internal cavity size was normal in size. There is mild concentric left ventricular hypertrophy. Left ventricular diastolic parameters are indeterminate. Right Ventricle: The right ventricular size is normal. No increase in right ventricular wall thickness. Right ventricular systolic function is normal. Left Atrium: Left atrial size was normal in size. Right Atrium: Right atrial size was normal in size. Pericardium: There is no evidence of pericardial effusion. Mitral Valve: The mitral valve is normal in structure. There is mild thickening of the anterior mitral valve leaflet(s). There is mild calcification of the anterior mitral valve leaflet(s). Moderate mitral annular calcification. Trivial mitral valve regurgitation. No evidence of mitral valve stenosis. Tricuspid Valve: The tricuspid valve is normal in structure. Tricuspid valve regurgitation is trivial. Aortic Valve: The aortic valve is tricuspid. There is mild calcification of the aortic valve. Aortic valve regurgitation is not visualized. Aortic valve sclerosis/calcification is present, without any evidence of aortic stenosis. Aortic valve mean gradient measures 6.5 mmHg. Aortic valve peak gradient measures 11.7 mmHg. Aortic valve area, by VTI measures 2.26 cm. Pulmonic Valve: The pulmonic valve was not well visualized. Pulmonic valve regurgitation is not visualized. Aorta: The aortic root is normal in size and structure. Venous: The inferior vena cava is normal in  size with greater than 50% respiratory variability, suggesting right atrial pressure of 3 mmHg. IAS/Shunts: No atrial level shunt detected by color flow Doppler.  LEFT VENTRICLE PLAX 2D LVIDd:         3.40 cm   Diastology LVIDs:         2.00 cm   LV e' medial:    7.07 cm/s LV PW:         1.14 cm   LV E/e' medial:  13.9 LV IVS:        1.28 cm   LV e' lateral:   8.81 cm/s LVOT diam:     2.00 cm   LV E/e' lateral: 11.1 LV SV:         87 LV SV Index:   50 LVOT Area:      3.14 cm                           3D Volume EF:                          3D EF:        62 %                          LV EDV:       150 ml                          LV ESV:       58 ml                          LV SV:        92 ml RIGHT VENTRICLE RV S prime:     11.70 cm/s TAPSE (M-mode): 2.0 cm LEFT ATRIUM             Index        RIGHT ATRIUM           Index LA diam:        3.50 cm 2.04 cm/m   RA Area:     12.40 cm LA Vol (A2C):   21.3 ml 12.39 ml/m  RA Volume:   26.60 ml  15.47 ml/m LA Vol (A4C):   44.4 ml 25.83 ml/m LA Biplane Vol: 32.2 ml 18.73 ml/m  AORTIC VALVE AV Area (Vmax):    2.22 cm AV Area (Vmean):   2.23 cm AV Area (VTI):     2.26 cm AV Vmax:           171.00 cm/s AV Vmean:          118.000 cm/s AV VTI:            0.383 m AV Peak Grad:      11.7 mmHg AV Mean Grad:      6.5 mmHg LVOT Vmax:         120.67 cm/s LVOT Vmean:        83.733 cm/s LVOT VTI:          0.276 m LVOT/AV VTI ratio: 0.72  AORTA Ao Root diam: 3.10 cm MITRAL VALVE MV Area (PHT): 2.83 cm    SHUNTS MV Decel Time: 268 msec    Systemic VTI:  0.28 m MV E velocity: 98.00 cm/s  Systemic Diam: 2.00 cm MV A velocity: 92.20 cm/s MV E/A ratio:  1.06 Sanda Klein MD Electronically signed by Sanda Klein MD Signature Date/Time: 07/05/2022/2:06:03 PM    Final    X-ray chest PA and lateral  Result Date: 07/04/2022 CLINICAL DATA:  Syncopal episode today.  Dialysis patient. EXAM: CHEST - 2 VIEW COMPARISON:  12/30/2021 FINDINGS: Previous median sternotomy and CABG. Heart size is normal. The lungs are clear. The vascularity is normal. No edema or effusions. No abnormal bone finding. IMPRESSION: Previous CABG. No active disease. Electronically Signed   By: Nelson Chimes M.D.   On: 07/04/2022 19:11   Medications:   amLODipine  2.5 mg Oral QHS   aspirin EC  81 mg Oral Daily   atorvastatin  40 mg Oral QHS   carvedilol  12.5 mg Oral BID WC   Chlorhexidine Gluconate Cloth  6 each Topical Q0600   clopidogrel  75 mg Oral Daily    doxercalciferol  3 mcg Intravenous Q M,W,F-HD   heparin  6,000 Units Dialysis Once in dialysis   hydrALAZINE  25 mg Oral TID   losartan  25 mg Oral Daily   sodium chloride flush  3 mL Intravenous Q12H   sucroferric oxyhydroxide  500 mg Oral TID WC    Dialysis Orders: MWF [facility] 3h 68min 400/800 56.2kg  2/2.5 bath AVG Hep 8000  - last HD post wt 57.7kg on 2/5 - hectorol 3 mcg IV tiw - venofer 50 q wk - mircera 50 mcg q 4 wks, last 1/17, due 2/14  Assessment/Plan: Syncope: During end of dialysis - seems like was dry on admit. 1L NS given overnight. Cardiology consulted, carotids ok, echo with hyperdynamic LV function. Meds have been adjusted. Will see how he does on HD today. ESRD: Continue HD on usual MWF schedule - HD today, no UF. HTN/volume: BP medications have been adjusted/reduced - now on amlod 2.5mg  QHS, losartan 25mg  QHS, hydralazine 25mg  TID, and metoprolol XL 50mg  QD.  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 Secondary HTPH: Ca ok, Phos high - continue VDRA + velphoro as binder (may need to ^ as outpatient).  DM2  COPD Hx CVA CAD (s/p CABG 2018)  Katie Teague Goynes, PA-C 07/06/2022, 10:22 AM  [facility]

## 2022-07-06 NOTE — Progress Notes (Signed)
Progress Note  Patient Name: Shawn Collins Date of Encounter: 07/06/2022  [facility] Cardiologist: Fransico Him, MD   Subjective   Currently in hemodialysis.  Chest pain or shortness of breath.  No further syncope.  He tells me he is only had 1 other heart rate syncopal episode in his life and that was also during dialysis.  Inpatient Medications    Scheduled Meds:  amLODipine  2.5 mg Oral QHS   aspirin EC  81 mg Oral Daily   atorvastatin  40 mg Oral QHS   carvedilol  12.5 mg Oral BID WC   Chlorhexidine Gluconate Cloth  6 each Topical Q0600   clopidogrel  75 mg Oral Daily   doxercalciferol  3 mcg Intravenous Q M,W,F-HD   heparin  6,000 Units Dialysis Once in dialysis   hydrALAZINE  25 mg Oral TID   losartan  25 mg Oral Daily   sodium chloride flush  3 mL Intravenous Q12H   sucroferric oxyhydroxide  500 mg Oral TID WC   Continuous Infusions:  PRN Meds: acetaminophen **OR** acetaminophen, heparin, nitroGLYCERIN, prochlorperazine   Vital Signs    Vitals:   07/06/22 0500 07/06/22 0842 07/06/22 0903 07/06/22 0930  BP:  120/65 126/61 (!) 148/58  Pulse:  (!) 58 (!) 57 (!) 59  Resp:  11 13 17   Temp:  97.8 F (36.6 C)    TempSrc:      SpO2:  99%    Weight: 57.9 kg 59.3 kg    Height:       No intake or output data in the 24 hours ending 07/06/22 0943    07/06/2022    8:42 AM 07/06/2022    5:00 AM 07/06/2022   12:34 AM  Last 3 Weights  Weight (lbs) 130 lb 11.7 oz 127 lb 11.2 oz 127 lb 11.2 oz  Weight (kg) 59.3 kg 57.924 kg 57.924 kg      Telemetry    Sinus rhythm- Personally Reviewed  ECG    No EKG to review- Personally Reviewed  Physical Exam   GEN: No acute distress.   Neck: No JVD Cardiac: RRR, no murmurs, rubs, or gallops.  Respiratory: Clear to auscultation bilaterally. GI: Soft, nontender, non-distended  MS: No edema; No deformity. Neuro:  Nonfocal  Psych: Normal affect   Labs    High Sensitivity Troponin:   Recent Labs  Lab  07/04/22 1418 07/04/22 1801  TROPONINIHS 23* 21*      Chemistry Recent Labs  Lab 07/04/22 1418 07/05/22 0058 07/06/22 0057  NA 138 135 131*  K 4.4 5.0 5.2*  CL 93* 94* 93*  CO2 28 28 28   GLUCOSE 105* 181* 139*  BUN 36* 50* 76*  CREATININE 6.61* 8.00* 9.75*  CALCIUM 8.5* 8.2* 8.3*  PROT 7.3 6.3* 6.0*  ALBUMIN 3.8 3.3* 3.1*  AST 30 28 29   ALT 19 17 20   ALKPHOS 58 51 47  BILITOT 0.4 0.5 0.4  GFRNONAA 8* 7* 5*  ANIONGAP 17* 13 10     Hematology Recent Labs  Lab 07/04/22 1418 07/05/22 0058 07/06/22 0057  WBC 4.8 5.5 5.2  RBC 3.91* 3.56* 3.48*  HGB 12.5* 11.3* 11.2*  HCT 38.3* 34.6* 33.3*  MCV 98.0 97.2 95.7  MCH 32.0 31.7 32.2  MCHC 32.6 32.7 33.6  RDW 13.2 13.2 13.2  PLT 226 222 214    BNPNo results for input(s): "BNP", "PROBNP" in the last 168 hours.   DDimer No results for input(s): "DDIMER" in the last  168 hours.   Radiology    VAS US CAROTID  Result Date: 07/05/2022 Carotid Arterial Duplex Study Patient Name:  Shawn Collins  Date of Exam:   07/05/2022 Medical Rec #: [id number]      Accession #:    [id number] Date of Birth: 03-Feb-1951      Patient Gender: M Patient Age:   72 years Exam Location:  [facility] Procedure:      VAS US CAROTID Referring Phys: DAVID ORTIZ --------------------------------------------------------------------------------  Indications:       Syncope. Risk Factors:      Hypertension, hyperlipidemia, Diabetes, coronary artery                    disease, prior CVA. Comparison Study:  no prior Performing Technologist: Archie Patten RVS  Examination Guidelines: A complete evaluation includes B-mode imaging, spectral Doppler, color Doppler, and power Doppler as needed of all accessible portions of each vessel. Bilateral testing is considered an integral part of a complete examination. Limited examinations for reoccurring indications may be performed as noted.  Right Carotid Findings:  +----------+--------+--------+--------+------------------+--------+           PSV cm/sEDV cm/sStenosisPlaque DescriptionComments +----------+--------+--------+--------+------------------+--------+ CCA Prox  143     14              heterogenous               +----------+--------+--------+--------+------------------+--------+ CCA Distal83      11              heterogenous               +----------+--------+--------+--------+------------------+--------+ ICA Prox  79      19      1-39%   heterogenous               +----------+--------+--------+--------+------------------+--------+ ICA Distal63      21                                         +----------+--------+--------+--------+------------------+--------+ ECA       157                                                +----------+--------+--------+--------+------------------+--------+ +----------+--------+-------+--------+-------------------+           PSV cm/sEDV cmsDescribeArm Pressure (mmHG) +----------+--------+-------+--------+-------------------+ Subclavian206                                        +----------+--------+-------+--------+-------------------+ +---------+--------+--+--------+--+---------+ VertebralPSV cm/s62EDV cm/s11Antegrade +---------+--------+--+--------+--+---------+  Left Carotid Findings: +----------+--------+--------+--------+------------------+--------+           PSV cm/sEDV cm/sStenosisPlaque DescriptionComments +----------+--------+--------+--------+------------------+--------+ CCA Prox  132     16              heterogenous               +----------+--------+--------+--------+------------------+--------+ CCA Distal91      13              heterogenous               +----------+--------+--------+--------+------------------+--------+ ICA Prox  72      19      1-39%   heterogenous                +----------+--------+--------+--------+------------------+--------+  ICA Distal64      20                                         +----------+--------+--------+--------+------------------+--------+ ECA       102                                                +----------+--------+--------+--------+------------------+--------+ +----------+--------+--------+--------+-------------------+           PSV cm/sEDV cm/sDescribeArm Pressure (mmHG) +----------+--------+--------+--------+-------------------+ [id number]     57                                  +----------+--------+--------+--------+-------------------+ +---------+--------+--+--------+---------+ VertebralPSV cm/s53EDV cm/sAntegrade +---------+--------+--+--------+---------+   Summary: Right Carotid: Velocities in the right ICA are consistent with a 1-39% stenosis. Left Carotid: Velocities in the left ICA are consistent with a 1-39% stenosis. Vertebrals: Bilateral vertebral arteries demonstrate antegrade flow. *See table(s) above for measurements and observations.  Electronically signed by Harold Barban MD on 07/05/2022 at 11:48:42 PM.    Final    ECHOCARDIOGRAM COMPLETE  Result Date: 07/05/2022    ECHOCARDIOGRAM REPORT   Patient Name:   Shawn Collins Date of Exam: 07/05/2022 Medical Rec #:  [id number]     Height:       70.0 in Accession #:    [id number]    Weight:       126.7 lb Date of Birth:  1950-08-15     BSA:          1.719 m Patient Age:    64 years      BP:           118/65 mmHg Patient Gender: M             HR:           68 bpm. Exam Location:  Inpatient Procedure: 2D Echo, Cardiac Doppler and Color Doppler Indications:    Syncope R55  History:        Patient has prior history of Echocardiogram examinations, most                 recent 12/19/2021. Previous Myocardial Infarction and CAD, Stroke                 and COPD, Signs/Symptoms:Dyspnea and Syncope; Risk                 Factors:Hypertension, Diabetes and Dyslipidemia.  ESRD.  Sonographer:    Ronny Flurry Referring Phys: [id number] DAVID MANUEL [place]  1. Left ventricular ejection fraction, by estimation, is 70 to 75%. The left ventricle has hyperdynamic function. The left ventricle has no regional wall motion abnormalities. There is mild concentric left ventricular hypertrophy. Left ventricular diastolic parameters are indeterminate.  2. Right ventricular systolic function is normal. The right ventricular size is normal.  3. The mitral valve is normal in structure. Trivial mitral valve regurgitation. No evidence of mitral stenosis. Moderate mitral annular calcification.  4. The aortic valve is tricuspid. There is mild calcification of the aortic valve. Aortic valve regurgitation is not visualized. Aortic valve sclerosis/calcification is present, without any evidence of aortic stenosis.  5. The inferior vena cava is normal in size with greater than  50% respiratory variability, suggesting right atrial pressure of 3 mmHg. Comparison(s): No significant change from prior study. Prior images reviewed side by side. FINDINGS  Left Ventricle: Left ventricular ejection fraction, by estimation, is 70 to 75%. The left ventricle has hyperdynamic function. The left ventricle has no regional wall motion abnormalities. 3D left ventricular ejection fraction analysis performed but not  reported based on interpreter judgement due to suboptimal tracking. The left ventricular internal cavity size was normal in size. There is mild concentric left ventricular hypertrophy. Left ventricular diastolic parameters are indeterminate. Right Ventricle: The right ventricular size is normal. No increase in right ventricular wall thickness. Right ventricular systolic function is normal. Left Atrium: Left atrial size was normal in size. Right Atrium: Right atrial size was normal in size. Pericardium: There is no evidence of pericardial effusion. Mitral Valve: The mitral valve is normal in structure.  There is mild thickening of the anterior mitral valve leaflet(s). There is mild calcification of the anterior mitral valve leaflet(s). Moderate mitral annular calcification. Trivial mitral valve regurgitation. No evidence of mitral valve stenosis. Tricuspid Valve: The tricuspid valve is normal in structure. Tricuspid valve regurgitation is trivial. Aortic Valve: The aortic valve is tricuspid. There is mild calcification of the aortic valve. Aortic valve regurgitation is not visualized. Aortic valve sclerosis/calcification is present, without any evidence of aortic stenosis. Aortic valve mean gradient measures 6.5 mmHg. Aortic valve peak gradient measures 11.7 mmHg. Aortic valve area, by VTI measures 2.26 cm. Pulmonic Valve: The pulmonic valve was not well visualized. Pulmonic valve regurgitation is not visualized. Aorta: The aortic root is normal in size and structure. Venous: The inferior vena cava is normal in size with greater than 50% respiratory variability, suggesting right atrial pressure of 3 mmHg. IAS/Shunts: No atrial level shunt detected by color flow Doppler.  LEFT VENTRICLE PLAX 2D LVIDd:         3.40 cm   Diastology LVIDs:         2.00 cm   LV e' medial:    7.07 cm/s LV PW:         1.14 cm   LV E/e' medial:  13.9 LV IVS:        1.28 cm   LV e' lateral:   8.81 cm/s LVOT diam:     2.00 cm   LV E/e' lateral: 11.1 LV SV:         87 LV SV Index:   50 LVOT Area:     3.14 cm                           3D Volume EF:                          3D EF:        62 %                          LV EDV:       150 ml                          LV ESV:       58 ml                          LV SV:        92 ml RIGHT  VENTRICLE RV S prime:     11.70 cm/s TAPSE (M-mode): 2.0 cm LEFT ATRIUM             Index        RIGHT ATRIUM           Index LA diam:        3.50 cm 2.04 cm/m   RA Area:     12.40 cm LA Vol (A2C):   21.3 ml 12.39 ml/m  RA Volume:   26.60 ml  15.47 ml/m LA Vol (A4C):   44.4 ml 25.83 ml/m LA Biplane Vol: 32.2  ml 18.73 ml/m  AORTIC VALVE AV Area (Vmax):    2.22 cm AV Area (Vmean):   2.23 cm AV Area (VTI):     2.26 cm AV Vmax:           171.00 cm/s AV Vmean:          118.000 cm/s AV VTI:            0.383 m AV Peak Grad:      11.7 mmHg AV Mean Grad:      6.5 mmHg LVOT Vmax:         120.67 cm/s LVOT Vmean:        83.733 cm/s LVOT VTI:          0.276 m LVOT/AV VTI ratio: 0.72  AORTA Ao Root diam: 3.10 cm MITRAL VALVE MV Area (PHT): 2.83 cm    SHUNTS MV Decel Time: 268 msec    Systemic VTI:  0.28 m MV E velocity: 98.00 cm/s  Systemic Diam: 2.00 cm MV A velocity: 92.20 cm/s MV E/A ratio:  1.06 Mihai Croitoru MD Electronically signed by Sanda Klein MD Signature Date/Time: 07/05/2022/2:06:03 PM    Final    X-ray chest PA and lateral  Result Date: 07/04/2022 CLINICAL DATA:  Syncopal episode today.  Dialysis patient. EXAM: CHEST - 2 VIEW COMPARISON:  12/30/2021 FINDINGS: Previous median sternotomy and CABG. Heart size is normal. The lungs are clear. The vascularity is normal. No edema or effusions. No abnormal bone finding. IMPRESSION: Previous CABG. No active disease. Electronically Signed   By: Nelson Chimes M.D.   On: 07/04/2022 19:11    Cardiac Studies   2D echo 07/05/22 IMPRESSIONS    1. Left ventricular ejection fraction, by estimation, is 70 to 75%. The  left ventricle has hyperdynamic function. The left ventricle has no  regional wall motion abnormalities. There is mild concentric left  ventricular hypertrophy. Left ventricular  diastolic parameters are indeterminate.   2. Right ventricular systolic function is normal. The right ventricular  size is normal.   3. The mitral valve is normal in structure. Trivial mitral valve  regurgitation. No evidence of mitral stenosis. Moderate mitral annular  calcification.   4. The aortic valve is tricuspid. There is mild calcification of the  aortic valve. Aortic valve regurgitation is not visualized. Aortic valve  sclerosis/calcification is present, without any  evidence of aortic  stenosis.   5. The inferior vena cava is normal in size with greater than 50%  respiratory variability, suggesting right atrial pressure of 3 mmHg.   Patient Profile     72 y.o. male with a hx of ASCAD s/p NSTEMI, with cath showing severe 3VD s/p CABG X 3  ( LIMA to LAD, SVG to OM1/OM2 and SVG D1) in 2018, CKD on HD, DM, HTN, COPD, stroke and HLD who is being seen 07/05/2022 for the evaluation of syncope at  the request of Dr. Moses Manners. .   Assessment & Plan    Syncope -Occurred after pulling off an extra liter of volume at dialysis -currently usually has ultrafiltration goal of 2 to 3 kg but had 4 kg on and it was felt that too much fluid was pulled off too fast in the setting of hyperdynamic LV function noted on 2D echo -He has not had any chest pain or shortness of breath, palpitations or dizziness -Only other episode of syncope occurred during dialysis a while back -EKG with no ischemia and high-sensitivity troponin was negative -No arrhythmias on telemetry -The echo showed hyperdynamic LV function with EF 70 to 75% mid cavitary obstruction.  Suspect that pulling off an extra liter of fluid resulted in a drop in preload and in the setting of hyperdynamic LV function he had a drop in his blood pressure resulting in syncope -He is currently on carvedilol 12.5 mg twice daily which has beta-blocker and alpha-blocker effects which could potentiate the drop in blood pressure afterload reduction in setting of midcavity obstruction from hyperdynamic LV function -I am going to change his Carvedilol to Toprol XL 50mg  daily and titrate as BP tolerates  ASCAD -Status post CABG in 2018 ( LIMA to LAD, SVG to OM1/OM2 and SVG D1)  -Has any chest pain -Continue aspirin 81 mg daily, Plavix 75 mg daily, beta-blocker and statin therapy  ESRD on hemodialysis -Followed by nephrology  HTN -BP stable today -Continue losartan 25 mg daily, hydralazine 25 mg 3 times daily, amlodipine 2.5 mg  daily and changing carvedilol to Toprol XL 50 mg daily as above  Hyperlipidemia -LDL goal less than 70 -Was 104 in July 2023 -Repeat FLP and ALT in a.m. -Continue atorvastatin 40 mg daily      For questions or updates, please contact [place] Please consult [email or web address] for contact info under        Signed, Fransico Him, MD  07/06/2022, 9:43 AM

## 2022-07-06 NOTE — Progress Notes (Signed)
  Progress Note   Patient: Shawn Collins OVF:[id number] DOB: [birth year] DOA: 07/04/2022     1 DOS: the patient was seen and examined on 07/06/2022   Brief hospital course:  Assessment and Plan: Syncope/collapse - ECHO LVEF 70-75%, no WMA's - Appreciate cardiology following   COPD  - Monitor    HTN - Norvasc 2.5 mg PO daily - Toprol XL 50 mg PO daily  - Hydralazine 25 mg PO tid  - Losartan 25 mg PO daily    HLD - Lipitor 40 mg PO daily    CAD - ASA 81 mg PO daily  - Lipitor 40 mg PO daily  - Plavix 75 mg PO daily    ESRD on HD with anemia  - Dialysis per nephrology  - Velphoro 500 mg PO tid    DM2 - ASA 81 mg PO daily  - Lipitor 40 mg PO daily    DVT prophylaxis: SCDs      Subjective: Pt seen and examined at the bedside. Appreciate cardiology adjusting the pt's cardiac medications.  Continue with dialysis per nephrology. No further syncopal episodes noted. PT/OT ordered.  Physical Exam: Vitals:   07/06/22 1200 07/06/22 1203 07/06/22 1215 07/06/22 1321  BP: (!) 147/67 (!) 153/70 (!) 153/78 (!) 160/73  Pulse: 60 60 60   Resp: 12 13 14    Temp:  (!) 97.5 F (36.4 C)  97.6 F (36.4 C)  TempSrc:  Oral  Oral  SpO2: 100% 100%  100%  Weight:      Height:       Constitutional:      Appearance: Normal appearance.  HENT:     Head: Normocephalic and atraumatic.  Cardiovascular:     Rate and Rhythm: Normal rate and regular rhythm.  Pulmonary:     Effort: Pulmonary effort is normal.  Abdominal:     General: Abdomen is flat.  Musculoskeletal:     Cervical back: Neck supple.  Skin:    General: Skin is warm.  Neurological:     Mental Status: He is alert. Mental status is at baseline.  Psychiatric:        Mood and Affect: Mood normal.   Data Reviewed:   Disposition: Status is: Inpatient  Planned Discharge Destination: Home    Time spent: 35 minutes  Author: Lucienne Minks , MD 07/06/2022 2:20 PM  For on call review [email or web address].

## 2022-07-06 NOTE — Plan of Care (Signed)
  Problem: Education: Goal: Knowledge of condition and prescribed therapy will improve Outcome: Adequate for Discharge   Problem: Education: Goal: Knowledge of General Education information will improve Description: Including pain rating scale, medication(s)/side effects and non-pharmacologic comfort measures Outcome: Adequate for Discharge   Problem: Health Behavior/Discharge Planning: Goal: Ability to manage health-related needs will improve Outcome: Adequate for Discharge

## 2022-07-06 NOTE — Progress Notes (Addendum)
   07/06/22 1203  Vitals  Temp (!) 97.5 F (36.4 C)  Pulse Rate 60  Resp 13  BP (!) 153/70  SpO2 100 %  O2 Device Room Air  Oxygen Therapy  Pulse Oximetry Type Continuous  Post Treatment  Dialyzer Clearance Clear  Duration of HD Treatment -hour(s) 3 hour(s)  Hemodialysis Intake (mL) 0 mL  Liters Processed 72  Fluid Removed (mL) 0 mL  Tolerated HD Treatment Yes  AVG/AVF Arterial Site Held (minutes) 10 minutes  AVG/AVF Venous Site Held (minutes) 10 minutes   Received patient in bed to unit.  Alert and oriented.  Informed consent signed and in chart.   TX duration:3.0 hours per md order Post weight 59kg  Patient tolerated well.  Transported back to the room  Alert, without acute distress.  Hand-off given to patient's nurse.   Access used: LLAA Access issues: no complications  Total UF removed: 0 Medication(s) given: none [facility]

## 2022-07-06 NOTE — Evaluation (Signed)
Physical Therapy Evaluation Patient Details Name: Shawn Collins MRN: [id number] DOB: 10-14-1950 Today's Date: 07/06/2022  History of Present Illness  72 yo male presenting for syncopal episode which happened late in dialysis session 2/5. PMH: COPD, DM2, HTN, Hx of NSTEMI, CKD/ESRD, CAD, CVA  Clinical Impression  Pt is presenting close to baseline. Mod I for all activities due to increased time and guarded movements. Pt encouraged to increase arm swing and step length with gait in order to decrease risk for falls. Discussed using a SPC when first at home in order to be safe. HR remained between 65-72 throughout session.   Pt has one friend who helps with transportation occasionally. Pt states that he lives in a boarding house and has assistance intermittently. Pt currently has no further needs for skilled physical therapy services. Discussed speaking with PCP if he would like to work on balance for OPPT. If further needs arise please re-consult.      Recommendations for follow up therapy are one component of a multi-disciplinary discharge planning process, led by the attending physician.  Recommendations may be updated based on patient status, additional functional criteria and insurance authorization.  Follow Up Recommendations No PT follow up      Assistance Recommended at Discharge PRN  Patient can return home with the following  Assist for transportation    Equipment Recommendations None recommended by PT  Recommendations for Other Services       Functional Status Assessment Patient has not had a recent decline in their functional status     Precautions / Restrictions Precautions Precautions: Fall Restrictions Weight Bearing Restrictions: No      Mobility  Bed Mobility Overal bed mobility: Modified Independent               Patient Response: Cooperative  Transfers Overall transfer level: Modified independent    Ambulation/Gait Ambulation/Gait assistance: Modified  independent (Device/Increase time) Gait Distance (Feet): 200 Feet Assistive device: None Gait Pattern/deviations: Decreased stride length, Decreased dorsiflexion - right   Gait velocity interpretation: <1.8 ft/sec, indicate of risk for recurrent falls   General Gait Details: Decreased arm swing, overall no overt LOB, pt is slightly gaurded with gait.  Stairs Stairs:  (not applicable pt has ramp to get into home.)              Balance Overall balance assessment: Mild deficits observed, not formally tested         Pertinent Vitals/Pain Pain Assessment Pain Assessment: No/denies pain    Home Living Family/patient expects to be discharged to:: Private residence Living Arrangements: Other (Comment) (rents a room in a boarding house) Available Help at Discharge: Available PRN/intermittently Type of Home: House Home Access: Ramped entrance     Alternate Level Stairs-Number of Steps: flight Home Layout: Two level;Able to live on main level with bedroom/bathroom Home Equipment: Rolling Walker (2 wheels);Cane - single point Additional Comments: Pt states that he has a friend who can help him with transportation occasionally    Prior Function Prior Level of Function : Independent/Modified Independent             Mobility Comments: Pt reports taht he takes a little extra time to move and is careful but did not need assistance and was not using an AD. ADLs Comments: Pt reports he likes to fiddle around with stuff but does not work and is on disability.     Hand Dominance   Dominant Hand: Right    Extremity/Trunk Assessment   Upper Extremity  Assessment Upper Extremity Assessment: Overall WFL for tasks assessed    Lower Extremity Assessment Lower Extremity Assessment: Overall WFL for tasks assessed    Cervical / Trunk Assessment Cervical / Trunk Assessment: Normal  Communication   Communication: No difficulties  Cognition Arousal/Alertness: Awake/alert Behavior  During Therapy: WFL for tasks assessed/performed Overall Cognitive Status: No family/caregiver present to determine baseline cognitive functioning                                 General Comments: Mostly intact. Occasional difficulty explaining home set up. Previous history of CVA               Assessment/Plan    PT Assessment Patient does not need any further PT services  PT Problem List         PT Treatment Interventions      PT Goals (Current goals can be found in the Care Plan section)  Acute Rehab PT Goals PT Goal Formulation: All assessment and education complete, DC therapy    Frequency          AM-PAC PT "6 Clicks" Mobility  Outcome Measure Help needed turning from your back to your side while in a flat bed without using bedrails?: None Help needed moving from lying on your back to sitting on the side of a flat bed without using bedrails?: None Help needed moving to and from a bed to a chair (including a wheelchair)?: None Help needed standing up from a chair using your arms (e.g., wheelchair or bedside chair)?: None Help needed to walk in hospital room?: None Help needed climbing 3-5 steps with a railing? : None 6 Click Score: 24    End of Session Equipment Utilized During Treatment: Gait belt Activity Tolerance: Patient tolerated treatment well Patient left: in bed;with bed alarm set;with call bell/phone within reach Nurse Communication: Mobility status      Time: 2458-0998 PT Time Calculation (min) (ACUTE ONLY): 23 min   Charges:   PT Evaluation $PT Eval Low Complexity: 1 Low PT Treatments $Gait Training: 8-22 mins        Tomma Rakers, DPT, [place] Office: [phone] (Secure chat preferred)   Ander Purpura 07/06/2022, 4:00 PM

## 2022-07-06 NOTE — Progress Notes (Signed)
Pt receives out-pt HD at [facility] on MWF. Will assist as needed.   Kohlton Gilpatrick Renal Navigator [phone] 

## 2022-07-07 DIAGNOSIS — E7800 Pure hypercholesterolemia, unspecified: Secondary | ICD-10-CM | POA: Diagnosis not present

## 2022-07-07 DIAGNOSIS — I2510 Atherosclerotic heart disease of native coronary artery without angina pectoris: Secondary | ICD-10-CM | POA: Diagnosis not present

## 2022-07-07 DIAGNOSIS — R55 Syncope and collapse: Secondary | ICD-10-CM | POA: Diagnosis not present

## 2022-07-07 DIAGNOSIS — I10 Essential (primary) hypertension: Secondary | ICD-10-CM | POA: Diagnosis not present

## 2022-07-07 LAB — COMPREHENSIVE METABOLIC PANEL
ALT: 26 U/L (ref 0–44)
AST: 31 U/L (ref 15–41)
Albumin: 2.9 g/dL — ABNORMAL LOW (ref 3.5–5.0)
Alkaline Phosphatase: 47 U/L (ref 38–126)
Anion gap: 11 (ref 5–15)
BUN: 45 mg/dL — ABNORMAL HIGH (ref 8–23)
CO2: 27 mmol/L (ref 22–32)
Calcium: 8.1 mg/dL — ABNORMAL LOW (ref 8.9–10.3)
Chloride: 94 mmol/L — ABNORMAL LOW (ref 98–111)
Creatinine, Ser: 6.48 mg/dL — ABNORMAL HIGH (ref 0.61–1.24)
GFR, Estimated: 8 mL/min — ABNORMAL LOW (ref >60)
Glucose, Bld: 130 mg/dL — ABNORMAL HIGH (ref 70–99)
Potassium: 4.8 mmol/L (ref 3.5–5.1)
Sodium: 132 mmol/L — ABNORMAL LOW (ref 135–145)
Total Bilirubin: 0.6 mg/dL (ref 0.3–1.2)
Total Protein: 5.8 g/dL — ABNORMAL LOW (ref 6.5–8.1)

## 2022-07-07 LAB — CBC
HCT: 32.1 % — ABNORMAL LOW (ref 39.0–52.0)
Hemoglobin: 10.9 g/dL — ABNORMAL LOW (ref 13.0–17.0)
MCH: 32.3 pg (ref 26.0–34.0)
MCHC: 34 g/dL (ref 30.0–36.0)
MCV: 95.3 fL (ref 80.0–100.0)
Platelets: 218 10*3/uL (ref 150–400)
RBC: 3.37 MIL/uL — ABNORMAL LOW (ref 4.22–5.81)
RDW: 12.9 % (ref 11.5–15.5)
WBC: 4.5 10*3/uL (ref 4.0–10.5)
nRBC: 0 % (ref 0.0–0.2)

## 2022-07-07 LAB — GLUCOSE, CAPILLARY: Glucose-Capillary: 95 mg/dL (ref 70–99)

## 2022-07-07 LAB — MAGNESIUM: Magnesium: 1.9 mg/dL (ref 1.7–2.4)

## 2022-07-07 MED ORDER — METOPROLOL SUCCINATE ER 25 MG PO TB24: 75.0000 mg | ORAL_TABLET | Freq: Every day | ORAL | 0 refills | Status: DC

## 2022-07-07 MED ORDER — ASPIRIN 81 MG PO TBEC: 81.0000 mg | DELAYED_RELEASE_TABLET | Freq: Every day | ORAL | 0 refills | Status: DC

## 2022-07-07 MED ORDER — ATORVASTATIN CALCIUM 40 MG PO TABS: 40.0000 mg | ORAL_TABLET | Freq: Every day | ORAL | 0 refills | Status: DC

## 2022-07-07 MED ORDER — AMLODIPINE BESYLATE 2.5 MG PO TABS: 2.5000 mg | ORAL_TABLET | Freq: Every day | ORAL | 0 refills | Status: DC

## 2022-07-07 MED ORDER — HYDRALAZINE HCL 25 MG PO TABS: 25.0000 mg | ORAL_TABLET | Freq: Three times a day (TID) | ORAL | 0 refills | Status: DC

## 2022-07-07 MED ORDER — LOSARTAN POTASSIUM 25 MG PO TABS: 25.0000 mg | ORAL_TABLET | Freq: Every day | ORAL | 0 refills | Status: DC

## 2022-07-07 MED ORDER — METOPROLOL SUCCINATE ER 50 MG PO TB24
75.0000 mg | ORAL_TABLET | Freq: Every day | ORAL | Status: DC
  Administered 2022-07-07: 75 mg via ORAL
  Filled 2022-07-07: qty 1

## 2022-07-07 NOTE — Plan of Care (Signed)

## 2022-07-07 NOTE — TOC Initial Note (Signed)
Transition of Care [facility]) - Initial/Assessment Note    Patient Details  Name: Shawn Collins MRN: [id number] Date of Birth: 1950/09/20  [facility]) CM/SW Contact:    Zenon Mayo, RN Phone Number: 07/07/2022, 9:56 AM  Clinical Narrative:                 Patient is for dc today,has no needs.  Expected Discharge Plan: Home/Self Care Barriers to Discharge: No Barriers Identified   Patient Goals and CMS Choice Patient states their goals for this hospitalization and ongoing recovery are:: return home   Choice offered to / list presented to : NA      Expected Discharge Plan and Services In-house Referral: NA Discharge Planning Services: CM Consult Post Acute Care Choice: NA Living arrangements for the past 2 months: Single Family Home Expected Discharge Date: 07/07/22               DME Arranged: N/A DME Agency: NA       HH Arranged: NA          Prior Living Arrangements/Services Living arrangements for the past 2 months: Single Family Home Lives with:: Self Patient language and need for interpreter reviewed:: Yes Do you feel safe going back to the place where you live?: Yes      Need for Family Participation in Patient Care: No (Comment) Care giver support system in place?: No (comment) Current home services: DME (has cane and walker but does not use) Criminal Activity/Legal Involvement Pertinent to Current Situation/Hospitalization: No - Comment as needed  Activities of Daily Living Home Assistive Devices/Equipment: Cane (specify quad or straight), Eyeglasses, Walker (specify type) ADL Screening (condition at time of admission) Patient's cognitive ability adequate to safely complete daily activities?: Yes Is the patient deaf or have difficulty hearing?: Yes Does the patient have difficulty seeing, even when wearing glasses/contacts?: No Does the patient have difficulty concentrating, remembering, or making decisions?: No Patient able to  express need for assistance with ADLs?: Yes Does the patient have difficulty dressing or bathing?: No Independently performs ADLs?: Yes (appropriate for developmental age) Does the patient have difficulty walking or climbing stairs?: No Weakness of Legs: Both Weakness of Arms/Hands: Both  Permission Sought/Granted                  Emotional Assessment Appearance:: Appears stated age Attitude/Demeanor/Rapport: Engaged Affect (typically observed): Appropriate Orientation: : Oriented to Self, Oriented to Place, Oriented to  Time, Oriented to Situation Alcohol / Substance Use: Not Applicable Psych Involvement: No (comment)  Admission diagnosis:  Syncope and collapse [R55] Syncope, unspecified syncope type [R55] Syncope [R55] Patient Active Problem List   Diagnosis Date Noted   Syncope 07/05/2022   Syncope and collapse 07/04/2022   Type 2 diabetes mellitus ([place]) 07/04/2022   ESRD with anemia ([place]) 29/56/2130   Acute metabolic encephalopathy due to hypoglycemia 12/30/2021   Hypothermia 12/30/2021   Uncontrolled type 2 diabetes mellitus with hypoglycemia, with long-term current use of insulin ([place]) 12/30/2021   History of CVA (cerebrovascular accident) 12/30/2021   DNR (do not resuscitate) 12/30/2021   Stroke determined by clinical assessment ([place]) 12/18/2021   ESRD (end stage renal disease) on dialysis ([place]) 02/23/2021   DOE (dyspnea on exertion) 11/22/2017   Hyperlipidemia LDL goal <70 04/17/2017   CAD (coronary artery disease), native coronary artery    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 09/10/2014   Diabetes mellitus without complication ([place]) 83/01/4075   Hypertension 09/10/2014   Emphysema lung ([place]) 09/10/2014   PCP:  Nolene Ebbs, MD Pharmacy:   [facility], [address] [place] [address] Phone: [phone] Fax: [phone]     Social Determinants of Health (SDOH) Social History: [place]: No Food Insecurity (07/05/2022)  Housing: Low Risk  (07/05/2022)  Transportation Needs: No Transportation Needs (07/05/2022)  Utilities: Not At Risk (07/05/2022)  Tobacco Use: Medium Risk (07/04/2022)   SDOH Interventions:     Readmission Risk Interventions    07/07/2022    9:49 AM  Readmission Risk Prevention Plan  Transportation Screening Complete  PCP or Specialist Appt within 3-5 Days Complete  HRI or Home Care Consult Complete  Palliative Care Screening Not Applicable  Medication Review (RN Care Manager) Complete

## 2022-07-07 NOTE — Plan of Care (Signed)
  Problem: Education: Goal: Knowledge of condition and prescribed therapy will improve 07/07/2022 0956 by Karlyn Agee, RN Outcome: Completed/Met 07/07/2022 0955 by Karlyn Agee, RN Outcome: Progressing   Problem: Cardiac: Goal: Will achieve and/or maintain adequate cardiac output 07/07/2022 0956 by Karlyn Agee, RN Outcome: Completed/Met 07/07/2022 0955 by Karlyn Agee, RN Outcome: Progressing   Problem: Physical Regulation: Goal: Complications related to the disease process, condition or treatment will be avoided or minimized 07/07/2022 0956 by Karlyn Agee, RN Outcome: Completed/Met 07/07/2022 0955 by Karlyn Agee, RN Outcome: Progressing   Problem: Education: Goal: Knowledge of General Education information will improve Description: Including pain rating scale, medication(s)/side effects and non-pharmacologic comfort measures 07/07/2022 0956 by Karlyn Agee, RN Outcome: Completed/Met 07/07/2022 0955 by Karlyn Agee, RN Outcome: Progressing   Problem: Health Behavior/Discharge Planning: Goal: Ability to manage health-related needs will improve 07/07/2022 0956 by Karlyn Agee, RN Outcome: Completed/Met 07/07/2022 0955 by Karlyn Agee, RN Outcome: Progressing   Problem: Clinical Measurements: Goal: Ability to maintain clinical measurements within normal limits will improve 07/07/2022 0956 by Karlyn Agee, RN Outcome: Completed/Met 07/07/2022 0955 by Karlyn Agee, RN Outcome: Progressing Goal: Will remain free from infection 07/07/2022 0956 by Karlyn Agee, RN Outcome: Completed/Met 07/07/2022 0955 by Karlyn Agee, RN Outcome: Progressing Goal: Diagnostic test results will improve 07/07/2022 0956 by Karlyn Agee, RN Outcome: Completed/Met 07/07/2022 0955 by Karlyn Agee, RN Outcome: Progressing Goal: Respiratory complications will improve 07/07/2022 0956 by Karlyn Agee, RN Outcome: Completed/Met 07/07/2022 0955 by Karlyn Agee, RN Outcome:  Progressing Goal: Cardiovascular complication will be avoided 07/07/2022 0956 by Karlyn Agee, RN Outcome: Completed/Met 07/07/2022 0955 by Karlyn Agee, RN Outcome: Progressing   Problem: Activity: Goal: Risk for activity intolerance will decrease 07/07/2022 0956 by Karlyn Agee, RN Outcome: Completed/Met 07/07/2022 0955 by Karlyn Agee, RN Outcome: Progressing   Problem: Nutrition: Goal: Adequate nutrition will be maintained 07/07/2022 0956 by Karlyn Agee, RN Outcome: Completed/Met 07/07/2022 0955 by Karlyn Agee, RN Outcome: Progressing   Problem: Coping: Goal: Level of anxiety will decrease 07/07/2022 0956 by Karlyn Agee, RN Outcome: Completed/Met 07/07/2022 0955 by Karlyn Agee, RN Outcome: Progressing   Problem: Elimination: Goal: Will not experience complications related to bowel motility 07/07/2022 0956 by Karlyn Agee, RN Outcome: Completed/Met 07/07/2022 0955 by Karlyn Agee, RN Outcome: Progressing Goal: Will not experience complications related to urinary retention 07/07/2022 0956 by Karlyn Agee, RN Outcome: Completed/Met 07/07/2022 0955 by Karlyn Agee, RN Outcome: Progressing   Problem: Pain Managment: Goal: General experience of comfort will improve 07/07/2022 0956 by Karlyn Agee, RN Outcome: Completed/Met 07/07/2022 0955 by Karlyn Agee, RN Outcome: Progressing   Problem: Safety: Goal: Ability to remain free from injury will improve 07/07/2022 0956 by Karlyn Agee, RN Outcome: Completed/Met 07/07/2022 0955 by Karlyn Agee, RN Outcome: Progressing   Problem: Skin Integrity: Goal: Risk for impaired skin integrity will decrease 07/07/2022 0956 by Karlyn Agee, RN Outcome: Completed/Met 07/07/2022 0955 by Karlyn Agee, RN Outcome: Progressing

## 2022-07-07 NOTE — Progress Notes (Signed)
D/C order noted. Contacted [facility] to advise clinic of pt's d/c today and that pt should resume care tomorrow.   Melven Sartorius Renal Navigator [phone]

## 2022-07-07 NOTE — Discharge Summary (Signed)
Physician Discharge Summary   Patient: Shawn Collins MRN: [id number] DOB: Jun 23, 1950  Admit date:     07/04/2022  Discharge date: 07/07/22  Discharge Physician: Lucienne Minks    PCP: Nolene Ebbs, MD   Recommendations at discharge:    Please ensure you take the blood pressure medications as prescribed   Discharge Diagnoses: Principal Problem:   Syncope and collapse Active Problems:   COPD (chronic obstructive pulmonary disease) ([place])   Hypertension   Hyperlipidemia LDL goal <70   CAD (coronary artery disease), native coronary artery   ESRD (end stage renal disease) on dialysis ([place])   Type 2 diabetes mellitus ([place])   ESRD with anemia ([place])   Syncope  Resolved Problems:   * No resolved hospital problems. *  Hospital Course: 72 yo M evaluated for syncope after dialysis. Cardiology and nephrology were both consulted. PT/OT also consulted. Cardiology felt the pt's syncope was due to pulling off an extra liter of volume during dialysis. EKG (no ischemia) and high sensitivity troponin was negative. No arrhythmias on telemetry. ECHO showed LVEF 70-75%.   Cardiology discontinued the pt's coreg due to the alpha blocker effects and instead placed the pt on Toprol XL 75 mg PO daily. Hydralazine 25 mg PO tid, Losartan 25 mg PO daily and Norvasc 2.5 mg PO daily were also continued. ASA, plavix and lipitor were continued. Pt had dialysis in the hospital and did not have any further syncopal episodes. Pt was evaluated by PT/OT and cleared for discharge.  Pt will be discharged on 07/07/2022. Continue with regularly scheudled dialysis and new BP medications.  Assessment and Plan:     Consultants: Cardiology and nephrology  Disposition: Home Diet recommendation:  Renal diet DISCHARGE MEDICATION: Allergies as of 07/07/2022   No Known Allergies      Medication List     STOP taking these medications    carvedilol 12.5 MG tablet Commonly known as: COREG       TAKE these medications     amLODipine 2.5 MG tablet Commonly known as: NORVASC Take 1 tablet (2.5 mg total) by mouth at bedtime. What changed:  medication strength how much to take   aspirin EC 81 MG tablet Take 1 tablet (81 mg total) by mouth daily. Swallow whole. What changed: additional instructions   atorvastatin 40 MG tablet Commonly known as: LIPITOR Take 1 tablet (40 mg total) by mouth daily. What changed: when to take this   clopidogrel 75 MG tablet Commonly known as: PLAVIX Take 1 tablet (75 mg total) by mouth daily.   doxercalciferol 4 MCG/2ML injection Commonly known as: HECTOROL Inject 2 mLs (4 mcg total) into the vein every Monday, Wednesday, and Friday with hemodialysis.   furosemide 40 MG tablet Commonly known as: LASIX Take 40 mg by mouth daily.   hydrALAZINE 25 MG tablet Commonly known as: APRESOLINE Take 1 tablet (25 mg total) by mouth 3 (three) times daily. What changed:  medication strength how much to take   losartan 25 MG tablet Commonly known as: COZAAR Take 1 tablet (25 mg total) by mouth daily.   metoprolol succinate 25 MG 24 hr tablet Commonly known as: TOPROL-XL Take 3 tablets (75 mg total) by mouth daily. Start taking on: July 08, 2022   nitroGLYCERIN 0.4 MG SL tablet Commonly known as: NITROSTAT Place 0.4 mg under the tongue every 5 (five) minutes as needed for chest pain.   Velphoro 500 MG chewable tablet Generic drug: sucroferric oxyhydroxide Chew 500 mg by mouth 3 (three)  times daily.        Discharge Exam: Filed Weights   07/06/22 0500 07/06/22 0842 07/07/22 0355  Weight: 57.9 kg 59.3 kg 59.9 kg   Condition at discharge: fair  The results of significant diagnostics from this hospitalization (including imaging, microbiology, ancillary and laboratory) are listed below for reference.   Imaging Studies: VAS US CAROTID  Result Date: 07/05/2022 Carotid Arterial Duplex Study Patient Name:  FARIS COOLMAN  Date of Exam:   07/05/2022 Medical Rec #:  [id number]      Accession #:    [id number] Date of Birth: [birth year]      Patient Gender: M Patient Age:   72 years Exam Location:  [facility] Procedure:      VAS US CAROTID Referring Phys: DAVID ORTIZ --------------------------------------------------------------------------------  Indications:       Syncope. Risk Factors:      Hypertension, hyperlipidemia, Diabetes, coronary artery                    disease, prior CVA. Comparison Study:  no prior Performing Technologist: Archie Patten RVS  Examination Guidelines: A complete evaluation includes B-mode imaging, spectral Doppler, color Doppler, and power Doppler as needed of all accessible portions of each vessel. Bilateral testing is considered an integral part of a complete examination. Limited examinations for reoccurring indications may be performed as noted.  Right Carotid Findings: +----------+--------+--------+--------+------------------+--------+           PSV cm/sEDV cm/sStenosisPlaque DescriptionComments +----------+--------+--------+--------+------------------+--------+ CCA Prox  143     14              heterogenous               +----------+--------+--------+--------+------------------+--------+ CCA Distal83      11              heterogenous               +----------+--------+--------+--------+------------------+--------+ ICA Prox  79      19      1-39%   heterogenous               +----------+--------+--------+--------+------------------+--------+ ICA Distal63      21                                         +----------+--------+--------+--------+------------------+--------+ ECA       157                                                +----------+--------+--------+--------+------------------+--------+ +----------+--------+-------+--------+-------------------+           PSV cm/sEDV cmsDescribeArm Pressure (mmHG) +----------+--------+-------+--------+-------------------+ Subclavian206                                         +----------+--------+-------+--------+-------------------+ +---------+--------+--+--------+--+---------+ VertebralPSV cm/s62EDV cm/s11Antegrade +---------+--------+--+--------+--+---------+  Left Carotid Findings: +----------+--------+--------+--------+------------------+--------+           PSV cm/sEDV cm/sStenosisPlaque DescriptionComments +----------+--------+--------+--------+------------------+--------+ CCA Prox  132     16              heterogenous               +----------+--------+--------+--------+------------------+--------+ CCA Distal91      13  heterogenous               +----------+--------+--------+--------+------------------+--------+ ICA Prox  72      19      1-39%   heterogenous               +----------+--------+--------+--------+------------------+--------+ ICA Distal64      20                                         +----------+--------+--------+--------+------------------+--------+ ECA       102                                                +----------+--------+--------+--------+------------------+--------+ +----------+--------+--------+--------+-------------------+           PSV cm/sEDV cm/sDescribeArm Pressure (mmHG) +----------+--------+--------+--------+-------------------+ [id number]     57                                  +----------+--------+--------+--------+-------------------+ +---------+--------+--+--------+---------+ VertebralPSV cm/s53EDV cm/sAntegrade +---------+--------+--+--------+---------+   Summary: Right Carotid: Velocities in the right ICA are consistent with a 1-39% stenosis. Left Carotid: Velocities in the left ICA are consistent with a 1-39% stenosis. Vertebrals: Bilateral vertebral arteries demonstrate antegrade flow. *See table(s) above for measurements and observations.  Electronically signed by Harold Barban MD on 07/05/2022 at 11:48:42 PM.    Final     ECHOCARDIOGRAM COMPLETE  Result Date: 07/05/2022    ECHOCARDIOGRAM REPORT   Patient Name:   Javar Laskowski Date of Exam: 07/05/2022 Medical Rec #:  [id number]     Height:       70.0 in Accession #:    [id number]    Weight:       126.7 lb Date of Birth:  [birth year]     BSA:          1.719 m Patient Age:    72 years      BP:           118/65 mmHg Patient Gender: M             HR:           68 bpm. Exam Location:  Inpatient Procedure: 2D Echo, Cardiac Doppler and Color Doppler Indications:    Syncope R55  History:        Patient has prior history of Echocardiogram examinations, most                 recent 12/19/2021. Previous Myocardial Infarction and CAD, Stroke                 and COPD, Signs/Symptoms:Dyspnea and Syncope; Risk                 Factors:Hypertension, Diabetes and Dyslipidemia. ESRD.  Sonographer:    Ronny Flurry Referring Phys: [id number] DAVID MANUEL [place]  1. Left ventricular ejection fraction, by estimation, is 70 to 75%. The left ventricle has hyperdynamic function. The left ventricle has no regional wall motion abnormalities. There is mild concentric left ventricular hypertrophy. Left ventricular diastolic parameters are indeterminate.  2. Right ventricular systolic function is normal. The right ventricular size is normal.  3. The mitral valve is normal in structure. Trivial mitral valve regurgitation. No evidence of mitral  stenosis. Moderate mitral annular calcification.  4. The aortic valve is tricuspid. There is mild calcification of the aortic valve. Aortic valve regurgitation is not visualized. Aortic valve sclerosis/calcification is present, without any evidence of aortic stenosis.  5. The inferior vena cava is normal in size with greater than 50% respiratory variability, suggesting right atrial pressure of 3 mmHg. Comparison(s): No significant change from prior study. Prior images reviewed side by side. FINDINGS  Left Ventricle: Left ventricular ejection fraction, by estimation,  is 70 to 75%. The left ventricle has hyperdynamic function. The left ventricle has no regional wall motion abnormalities. 3D left ventricular ejection fraction analysis performed but not  reported based on interpreter judgement due to suboptimal tracking. The left ventricular internal cavity size was normal in size. There is mild concentric left ventricular hypertrophy. Left ventricular diastolic parameters are indeterminate. Right Ventricle: The right ventricular size is normal. No increase in right ventricular wall thickness. Right ventricular systolic function is normal. Left Atrium: Left atrial size was normal in size. Right Atrium: Right atrial size was normal in size. Pericardium: There is no evidence of pericardial effusion. Mitral Valve: The mitral valve is normal in structure. There is mild thickening of the anterior mitral valve leaflet(s). There is mild calcification of the anterior mitral valve leaflet(s). Moderate mitral annular calcification. Trivial mitral valve regurgitation. No evidence of mitral valve stenosis. Tricuspid Valve: The tricuspid valve is normal in structure. Tricuspid valve regurgitation is trivial. Aortic Valve: The aortic valve is tricuspid. There is mild calcification of the aortic valve. Aortic valve regurgitation is not visualized. Aortic valve sclerosis/calcification is present, without any evidence of aortic stenosis. Aortic valve mean gradient measures 6.5 mmHg. Aortic valve peak gradient measures 11.7 mmHg. Aortic valve area, by VTI measures 2.26 cm. Pulmonic Valve: The pulmonic valve was not well visualized. Pulmonic valve regurgitation is not visualized. Aorta: The aortic root is normal in size and structure. Venous: The inferior vena cava is normal in size with greater than 50% respiratory variability, suggesting right atrial pressure of 3 mmHg. IAS/Shunts: No atrial level shunt detected by color flow Doppler.  LEFT VENTRICLE PLAX 2D LVIDd:         3.40 cm   Diastology  LVIDs:         2.00 cm   LV e' medial:    7.07 cm/s LV PW:         1.14 cm   LV E/e' medial:  13.9 LV IVS:        1.28 cm   LV e' lateral:   8.81 cm/s LVOT diam:     2.00 cm   LV E/e' lateral: 11.1 LV SV:         87 LV SV Index:   50 LVOT Area:     3.14 cm                           3D Volume EF:                          3D EF:        62 %                          LV EDV:       150 ml  LV ESV:       58 ml                          LV SV:        92 ml RIGHT VENTRICLE RV S prime:     11.70 cm/s TAPSE (M-mode): 2.0 cm LEFT ATRIUM             Index        RIGHT ATRIUM           Index LA diam:        3.50 cm 2.04 cm/m   RA Area:     12.40 cm LA Vol (A2C):   21.3 ml 12.39 ml/m  RA Volume:   26.60 ml  15.47 ml/m LA Vol (A4C):   44.4 ml 25.83 ml/m LA Biplane Vol: 32.2 ml 18.73 ml/m  AORTIC VALVE AV Area (Vmax):    2.22 cm AV Area (Vmean):   2.23 cm AV Area (VTI):     2.26 cm AV Vmax:           171.00 cm/s AV Vmean:          118.000 cm/s AV VTI:            0.383 m AV Peak Grad:      11.7 mmHg AV Mean Grad:      6.5 mmHg LVOT Vmax:         120.67 cm/s LVOT Vmean:        83.733 cm/s LVOT VTI:          0.276 m LVOT/AV VTI ratio: 0.72  AORTA Ao Root diam: 3.10 cm MITRAL VALVE MV Area (PHT): 2.83 cm    SHUNTS MV Decel Time: 268 msec    Systemic VTI:  0.28 m MV E velocity: 98.00 cm/s  Systemic Diam: 2.00 cm MV A velocity: 92.20 cm/s MV E/A ratio:  1.06 Mihai Croitoru MD Electronically signed by Sanda Klein MD Signature Date/Time: 07/05/2022/2:06:03 PM    Final    X-ray chest PA and lateral  Result Date: 07/04/2022 CLINICAL DATA:  Syncopal episode today.  Dialysis patient. EXAM: CHEST - 2 VIEW COMPARISON:  12/30/2021 FINDINGS: Previous median sternotomy and CABG. Heart size is normal. The lungs are clear. The vascularity is normal. No edema or effusions. No abnormal bone finding. IMPRESSION: Previous CABG. No active disease. Electronically Signed   By: Nelson Chimes M.D.   On: 07/04/2022 19:11     Microbiology: Results for orders placed or performed during the hospital encounter of 12/30/21  Blood Culture (routine x 2)     Status: None   Collection Time: 12/30/21  6:40 AM   Specimen: BLOOD RIGHT ARM  Result Value Ref Range Status   Specimen Description BLOOD RIGHT ARM  Final   Special Requests   Final    BOTTLES DRAWN AEROBIC ONLY Blood Culture results may not be optimal due to an inadequate volume of blood received in culture bottles   Culture   Final    NO GROWTH 5 DAYS Performed at [facility], [place] [address] [place], [place] [address]    Report Status 01/04/2022 FINAL  Final  Blood Culture (routine x 2)     Status: None   Collection Time: 12/30/21  7:17 AM   Specimen: BLOOD RIGHT FOREARM  Result Value Ref Range Status   Specimen Description BLOOD RIGHT FOREARM  Final   Special Requests   Final  BOTTLES DRAWN AEROBIC AND ANAEROBIC Blood Culture adequate volume   Culture   Final    NO GROWTH 5 DAYS Performed at [facility], [place] [address] [place], [place] [address]    Report Status 01/04/2022 FINAL  Final    Labs: CBC: Recent Labs  Lab 07/04/22 1418 07/05/22 0058 07/06/22 0057 07/07/22 0033  WBC 4.8 5.5 5.2 4.5  NEUTROABS 3.2  --   --   --   HGB 12.5* 11.3* 11.2* 10.9*  HCT 38.3* 34.6* 33.3* 32.1*  MCV 98.0 97.2 95.7 95.3  PLT 226 222 214 672   Basic Metabolic Panel: Recent Labs  Lab 07/04/22 1418 07/05/22 0058 07/06/22 0057 07/07/22 0033  NA 138 135 131* 132*  K 4.4 5.0 5.2* 4.8  CL 93* 94* 93* 94*  CO2 28 28 28 27   GLUCOSE 105* 181* 139* 130*  BUN 36* 50* 76* 45*  CREATININE 6.61* 8.00* 9.75* 6.48*  CALCIUM 8.5* 8.2* 8.3* 8.1*  MG  --   --  2.5* 1.9  PHOS  --  6.0* 6.4*  --    Liver Function Tests: Recent Labs  Lab 07/04/22 1418 07/05/22 0058 07/06/22 0057 07/07/22 0033  AST 30 28 29 31   ALT 19 17 20 26   ALKPHOS 58 51 47 47  BILITOT 0.4 0.5 0.4 0.6  PROT 7.3 6.3* 6.0* 5.8*  ALBUMIN 3.8 3.3* 3.1* 2.9*    CBG: Recent Labs  Lab 07/04/22 1422 07/05/22 0600 07/06/22 0602 07/07/22 0628  GLUCAP 102* 111* 92 95    Discharge time spent: greater than 30 minutes.  Signed: Lucienne Minks , MD [facility] 07/07/2022

## 2022-07-07 NOTE — TOC Transition Note (Signed)
Transition of Care [facility]) - CM/SW Discharge Note   Patient Details  Name: Shawn Collins MRN: [id number] Date of Birth: 1950/06/12  Transition of Care [facility]) CM/SW Contact:  Zenon Mayo, RN Phone Number: 07/07/2022, 10:08 AM   Clinical Narrative:    For dc today, has no needs.   Final next level of care: Home/Self Care Barriers to Discharge: No Barriers Identified   Patient Goals and CMS Choice   Choice offered to / list presented to : NA  Discharge Placement                         Discharge Plan and Services Additional resources added to the After Visit Summary for   In-house Referral: NA Discharge Planning Services: CM Consult Post Acute Care Choice: NA          DME Arranged: N/A DME Agency: NA       HH Arranged: NA          Social Determinants of Health (SDOH) Interventions SDOH Screenings   Food Insecurity: No Food Insecurity (07/05/2022)  Housing: Low Risk  (07/05/2022)  Transportation Needs: No Transportation Needs (07/05/2022)  Utilities: Not At Risk (07/05/2022)  Tobacco Use: Medium Risk (07/04/2022)     Readmission Risk Interventions    07/07/2022    9:49 AM  Readmission Risk Prevention Plan  Transportation Screening Complete  PCP or Specialist Appt within 3-5 Days Complete  HRI or [place] Complete  Palliative Care Screening Not Applicable  Medication Review (RN Care Manager) Complete

## 2022-07-07 NOTE — Progress Notes (Addendum)
Progress Note  Patient Name: Shawn Collins Date of Encounter: 07/07/2022  [facility] Cardiologist: Fransico Him, MD   Subjective   No other episodes of dizziness or syncope during HD.  Denies any chest pain or SOB.   Inpatient Medications    Scheduled Meds:  amLODipine  2.5 mg Oral QHS   aspirin EC  81 mg Oral Daily   atorvastatin  40 mg Oral QHS   Chlorhexidine Gluconate Cloth  6 each Topical Q0600   clopidogrel  75 mg Oral Daily   doxercalciferol  3 mcg Intravenous Q M,W,F-HD   hydrALAZINE  25 mg Oral TID   losartan  25 mg Oral Daily   metoprolol succinate  50 mg Oral Daily   sodium chloride flush  3 mL Intravenous Q12H   sucroferric oxyhydroxide  500 mg Oral TID WC   Continuous Infusions:  PRN Meds: acetaminophen **OR** acetaminophen, nitroGLYCERIN, prochlorperazine   Vital Signs    Vitals:   07/06/22 1510 07/06/22 1923 07/06/22 2041 07/07/22 0355  BP: (!) 176/69 (!) 146/64 (!) 166/66 137/65  Pulse: 60 67  62  Resp: 20 20  20   Temp: 98.4 F (36.9 C) 98.7 F (37.1 C)  98.4 F (36.9 C)  TempSrc: Oral Oral  Oral  SpO2: 100% 99%  99%  Weight:    59.9 kg  Height:        Intake/Output Summary (Last 24 hours) at 07/07/2022 3220 Last data filed at 07/06/2022 2114 Gross per 24 hour  Intake 240 ml  Output 0 ml  Net 240 ml      07/07/2022    3:55 AM 07/06/2022    8:42 AM 07/06/2022    5:00 AM  Last 3 Weights  Weight (lbs) 132 lb 1.6 oz 130 lb 11.7 oz 127 lb 11.2 oz  Weight (kg) 59.92 kg 59.3 kg 57.924 kg      Telemetry    NSR- Personally Reviewed  ECG    No EKG to review- Personally Reviewed  Physical Exam   GEN: Well nourished, well developed in no acute distress HEENT: Normal NECK: No JVD; No carotid bruits LYMPHATICS: No lymphadenopathy CARDIAC:RRR, no rubs, gallops 2/6 SM at RUSB RESPIRATORY:  Clear to auscultation without rales, wheezing or rhonchi  ABDOMEN: Soft, non-tender, non-distended MUSCULOSKELETAL:  No edema; No deformity  SKIN:  Warm and dry NEUROLOGIC:  Alert and oriented x 3 PSYCHIATRIC:  Normal affect  Labs    High Sensitivity Troponin:   Recent Labs  Lab 07/04/22 1418 07/04/22 1801  TROPONINIHS 23* 21*       Chemistry Recent Labs  Lab 07/05/22 0058 07/06/22 0057 07/07/22 0033  NA 135 131* 132*  K 5.0 5.2* 4.8  CL 94* 93* 94*  CO2 28 28 27   GLUCOSE 181* 139* 130*  BUN 50* 76* 45*  CREATININE 8.00* 9.75* 6.48*  CALCIUM 8.2* 8.3* 8.1*  PROT 6.3* 6.0* 5.8*  ALBUMIN 3.3* 3.1* 2.9*  AST 28 29 31   ALT 17 20 26   ALKPHOS 51 47 47  BILITOT 0.5 0.4 0.6  GFRNONAA 7* 5* 8*  ANIONGAP 13 10 11       Hematology Recent Labs  Lab 07/05/22 0058 07/06/22 0057 07/07/22 0033  WBC 5.5 5.2 4.5  RBC 3.56* 3.48* 3.37*  HGB 11.3* 11.2* 10.9*  HCT 34.6* 33.3* 32.1*  MCV 97.2 95.7 95.3  MCH 31.7 32.2 32.3  MCHC 32.7 33.6 34.0  RDW 13.2 13.2 12.9  PLT 222 214 218  BNPNo results for input(s): "BNP", "PROBNP" in the last 168 hours.   DDimer No results for input(s): "DDIMER" in the last 168 hours.   Radiology    VAS US CAROTID  Result Date: 07/05/2022 Carotid Arterial Duplex Study Patient Name:  Shawn Collins  Date of Exam:   07/05/2022 Medical Rec #: [id number]      Accession #:    [id number] Date of Birth: 1950-06-22      Patient Gender: M Patient Age:   30 years Exam Location:  [facility] Procedure:      VAS US CAROTID Referring Phys: DAVID ORTIZ --------------------------------------------------------------------------------  Indications:       Syncope. Risk Factors:      Hypertension, hyperlipidemia, Diabetes, coronary artery                    disease, prior CVA. Comparison Study:  no prior Performing Technologist: Archie Patten RVS  Examination Guidelines: A complete evaluation includes B-mode imaging, spectral Doppler, color Doppler, and power Doppler as needed of all accessible portions of each vessel. Bilateral testing is considered an integral part of a complete examination. Limited  examinations for reoccurring indications may be performed as noted.  Right Carotid Findings: +----------+--------+--------+--------+------------------+--------+           PSV cm/sEDV cm/sStenosisPlaque DescriptionComments +----------+--------+--------+--------+------------------+--------+ CCA Prox  143     14              heterogenous               +----------+--------+--------+--------+------------------+--------+ CCA Distal83      11              heterogenous               +----------+--------+--------+--------+------------------+--------+ ICA Prox  79      19      1-39%   heterogenous               +----------+--------+--------+--------+------------------+--------+ ICA Distal63      21                                         +----------+--------+--------+--------+------------------+--------+ ECA       157                                                +----------+--------+--------+--------+------------------+--------+ +----------+--------+-------+--------+-------------------+           PSV cm/sEDV cmsDescribeArm Pressure (mmHG) +----------+--------+-------+--------+-------------------+ Subclavian206                                        +----------+--------+-------+--------+-------------------+ +---------+--------+--+--------+--+---------+ VertebralPSV cm/s62EDV cm/s11Antegrade +---------+--------+--+--------+--+---------+  Left Carotid Findings: +----------+--------+--------+--------+------------------+--------+           PSV cm/sEDV cm/sStenosisPlaque DescriptionComments +----------+--------+--------+--------+------------------+--------+ CCA Prox  132     16              heterogenous               +----------+--------+--------+--------+------------------+--------+ CCA Distal91      13              heterogenous               +----------+--------+--------+--------+------------------+--------+ ICA Prox  72      19      1-39%    heterogenous               +----------+--------+--------+--------+------------------+--------+ ICA Distal64      20                                         +----------+--------+--------+--------+------------------+--------+ ECA       102                                                +----------+--------+--------+--------+------------------+--------+ +----------+--------+--------+--------+-------------------+           PSV cm/sEDV cm/sDescribeArm Pressure (mmHG) +----------+--------+--------+--------+-------------------+ [id number]     57                                  +----------+--------+--------+--------+-------------------+ +---------+--------+--+--------+---------+ VertebralPSV cm/s53EDV cm/sAntegrade +---------+--------+--+--------+---------+   Summary: Right Carotid: Velocities in the right ICA are consistent with a 1-39% stenosis. Left Carotid: Velocities in the left ICA are consistent with a 1-39% stenosis. Vertebrals: Bilateral vertebral arteries demonstrate antegrade flow. *See table(s) above for measurements and observations.  Electronically signed by Harold Barban MD on 07/05/2022 at 11:48:42 PM.    Final    ECHOCARDIOGRAM COMPLETE  Result Date: 07/05/2022    ECHOCARDIOGRAM REPORT   Patient Name:   Shawn Collins Date of Exam: 07/05/2022 Medical Rec #:  [id number]     Height:       70.0 in Accession #:    [id number]    Weight:       126.7 lb Date of Birth:  03/04/1951     BSA:          1.719 m Patient Age:    39 years      BP:           118/65 mmHg Patient Gender: M             HR:           68 bpm. Exam Location:  Inpatient Procedure: 2D Echo, Cardiac Doppler and Color Doppler Indications:    Syncope R55  History:        Patient has prior history of Echocardiogram examinations, most                 recent 12/19/2021. Previous Myocardial Infarction and CAD, Stroke                 and COPD, Signs/Symptoms:Dyspnea and Syncope; Risk                 Factors:Hypertension,  Diabetes and Dyslipidemia. ESRD.  Sonographer:    Ronny Flurry Referring Phys: [id number] DAVID MANUEL [place]  1. Left ventricular ejection fraction, by estimation, is 70 to 75%. The left ventricle has hyperdynamic function. The left ventricle has no regional wall motion abnormalities. There is mild concentric left ventricular hypertrophy. Left ventricular diastolic parameters are indeterminate.  2. Right ventricular systolic function is normal. The right ventricular size is normal.  3. The mitral valve is normal in structure. Trivial mitral valve regurgitation. No evidence of mitral stenosis. Moderate mitral annular calcification.  4. The aortic valve is tricuspid. There is mild calcification of the  aortic valve. Aortic valve regurgitation is not visualized. Aortic valve sclerosis/calcification is present, without any evidence of aortic stenosis.  5. The inferior vena cava is normal in size with greater than 50% respiratory variability, suggesting right atrial pressure of 3 mmHg. Comparison(s): No significant change from prior study. Prior images reviewed side by side. FINDINGS  Left Ventricle: Left ventricular ejection fraction, by estimation, is 70 to 75%. The left ventricle has hyperdynamic function. The left ventricle has no regional wall motion abnormalities. 3D left ventricular ejection fraction analysis performed but not  reported based on interpreter judgement due to suboptimal tracking. The left ventricular internal cavity size was normal in size. There is mild concentric left ventricular hypertrophy. Left ventricular diastolic parameters are indeterminate. Right Ventricle: The right ventricular size is normal. No increase in right ventricular wall thickness. Right ventricular systolic function is normal. Left Atrium: Left atrial size was normal in size. Right Atrium: Right atrial size was normal in size. Pericardium: There is no evidence of pericardial effusion. Mitral Valve: The mitral valve  is normal in structure. There is mild thickening of the anterior mitral valve leaflet(s). There is mild calcification of the anterior mitral valve leaflet(s). Moderate mitral annular calcification. Trivial mitral valve regurgitation. No evidence of mitral valve stenosis. Tricuspid Valve: The tricuspid valve is normal in structure. Tricuspid valve regurgitation is trivial. Aortic Valve: The aortic valve is tricuspid. There is mild calcification of the aortic valve. Aortic valve regurgitation is not visualized. Aortic valve sclerosis/calcification is present, without any evidence of aortic stenosis. Aortic valve mean gradient measures 6.5 mmHg. Aortic valve peak gradient measures 11.7 mmHg. Aortic valve area, by VTI measures 2.26 cm. Pulmonic Valve: The pulmonic valve was not well visualized. Pulmonic valve regurgitation is not visualized. Aorta: The aortic root is normal in size and structure. Venous: The inferior vena cava is normal in size with greater than 50% respiratory variability, suggesting right atrial pressure of 3 mmHg. IAS/Shunts: No atrial level shunt detected by color flow Doppler.  LEFT VENTRICLE PLAX 2D LVIDd:         3.40 cm   Diastology LVIDs:         2.00 cm   LV e' medial:    7.07 cm/s LV PW:         1.14 cm   LV E/e' medial:  13.9 LV IVS:        1.28 cm   LV e' lateral:   8.81 cm/s LVOT diam:     2.00 cm   LV E/e' lateral: 11.1 LV SV:         87 LV SV Index:   50 LVOT Area:     3.14 cm                           3D Volume EF:                          3D EF:        62 %                          LV EDV:       150 ml                          LV ESV:       58 ml  LV SV:        92 ml RIGHT VENTRICLE RV S prime:     11.70 cm/s TAPSE (M-mode): 2.0 cm LEFT ATRIUM             Index        RIGHT ATRIUM           Index LA diam:        3.50 cm 2.04 cm/m   RA Area:     12.40 cm LA Vol (A2C):   21.3 ml 12.39 ml/m  RA Volume:   26.60 ml  15.47 ml/m LA Vol (A4C):   44.4 ml 25.83  ml/m LA Biplane Vol: 32.2 ml 18.73 ml/m  AORTIC VALVE AV Area (Vmax):    2.22 cm AV Area (Vmean):   2.23 cm AV Area (VTI):     2.26 cm AV Vmax:           171.00 cm/s AV Vmean:          118.000 cm/s AV VTI:            0.383 m AV Peak Grad:      11.7 mmHg AV Mean Grad:      6.5 mmHg LVOT Vmax:         120.67 cm/s LVOT Vmean:        83.733 cm/s LVOT VTI:          0.276 m LVOT/AV VTI ratio: 0.72  AORTA Ao Root diam: 3.10 cm MITRAL VALVE MV Area (PHT): 2.83 cm    SHUNTS MV Decel Time: 268 msec    Systemic VTI:  0.28 m MV E velocity: 98.00 cm/s  Systemic Diam: 2.00 cm MV A velocity: 92.20 cm/s MV E/A ratio:  1.06 Mihai Croitoru MD Electronically signed by Sanda Klein MD Signature Date/Time: 07/05/2022/2:06:03 PM    Final     Cardiac Studies   2D echo 07/05/22 IMPRESSIONS    1. Left ventricular ejection fraction, by estimation, is 70 to 75%. The  left ventricle has hyperdynamic function. The left ventricle has no  regional wall motion abnormalities. There is mild concentric left  ventricular hypertrophy. Left ventricular  diastolic parameters are indeterminate.   2. Right ventricular systolic function is normal. The right ventricular  size is normal.   3. The mitral valve is normal in structure. Trivial mitral valve  regurgitation. No evidence of mitral stenosis. Moderate mitral annular  calcification.   4. The aortic valve is tricuspid. There is mild calcification of the  aortic valve. Aortic valve regurgitation is not visualized. Aortic valve  sclerosis/calcification is present, without any evidence of aortic  stenosis.   5. The inferior vena cava is normal in size with greater than 50%  respiratory variability, suggesting right atrial pressure of 3 mmHg.   Patient Profile     72 y.o. male with a hx of ASCAD s/p NSTEMI, with cath showing severe 3VD s/p CABG X 3  ( LIMA to LAD, SVG to OM1/OM2 and SVG D1) in 2018, CKD on HD, DM, HTN, COPD, stroke and HLD who is being seen 07/05/2022 for the  evaluation of syncope at the request of Dr. Moses Manners. .   Assessment & Plan    Syncope -Occurred after pulling off an extra liter of volume at dialysis -currently usually has ultrafiltration goal of 2 to 3 kg but had 4 kg on and it was felt that too much fluid was pulled off too fast in the setting of hyperdynamic LV function noted  on 2D echo -He has not had any chest pain or shortness of breath, palpitations or dizziness -Only other episode of syncope occurred during dialysis a while back -EKG with no ischemia and high-sensitivity troponin was negative -No arrhythmias on tele -The echo showed hyperdynamic LV function with EF 70 to 75% mid cavitary obstruction.  Suspect that pulling off an extra liter of fluid resulted in a drop in preload and in the setting of hyperdynamic LV function he had a drop in his blood pressure resulting in syncope -Carvedilol stopped due to potentiation of orthostasis with alpha-blocker effects which could potentiate the drop in blood pressure afterload reduction in setting of midcavity obstruction from hyperdynamic LV function -Increase Toprol XL to 75mg  daily due to elevated BP and to help with hyperdynamic LVF with mid cavitary obliteration during systole  ASCAD -Status post CABG in 2018 ( LIMA to LAD, SVG to OM1/OM2 and SVG D1)  -No CP -Continue aspirin 81 mg daily, Plavix 75 mg daily, beta-blocker and statin therapy  ESRD on hemodialysis -Followed by nephrology  HTN -BP elevated today -changed Carvedilol to Toprol yesterday to avoid alpha blocking effects of Carvedilol to avoid potentiation of orthostatic hypotension -Continue losartan 25 mg daily, hydralazine 25 mg 3 times daily, amlodipine 2.5 mg daily  -increase Toprol XL to 75mg  daily  Hyperlipidemia -LDL goal less than 70 -Was 104 in July 2023 -Repeat FLP and ALT in a.m. -Continue atorvastatin 40 mg daily      For questions or updates, please contact [place] Please consult  [email or web address] for contact info under        Signed, Fransico Him, MD  07/07/2022, 8:23 AM

## 2022-07-07 NOTE — Evaluation (Signed)
Occupational Therapy Evaluation Patient Details Name: Shawn Collins MRN: [id number] DOB: 05-04-1951 Today's Date: 07/07/2022   History of Present Illness 72 yo male presenting for syncopal episode which happened late in dialysis session 2/5. PMH: COPD, DM2, HTN, Hx of NSTEMI, CKD/ESRD, CAD, CVA   Clinical Impression   Pt was admitted as above presenting with deficits as listed below (please refer to OT problem list). Pt was seen for OT assessment today where he is overall Mod I-supervision for upper body and lower body ADL's and functional mobility/transfers given increased time for tasks. He lives in a boarding house where he rents a room and states that he has intermittent assist from friends and church family. He appears to be at or near baseline level of function overall and denies any further acute OT needs at this time. Pt is hopeful to d/c home today, will sign off OT.      Recommendations for follow up therapy are one component of a multi-disciplinary discharge planning process, led by the attending physician.  Recommendations may be updated based on patient status, additional functional criteria and insurance authorization.   Follow Up Recommendations  No OT follow up     Assistance Recommended at Discharge Intermittent Supervision/Assistance  Patient can return home with the following A little help with walking and/or transfers;A little help with bathing/dressing/bathroom    Functional Status Assessment  Patient has not had a recent decline in their functional status  Equipment Recommendations  None recommended by OT    Recommendations for Other Services       Precautions / Restrictions Precautions Precautions: Fall Restrictions Weight Bearing Restrictions: No      Mobility Bed Mobility Overal bed mobility: Modified Independent    Transfers Overall transfer level: Modified independent Equipment used: None      Balance Overall balance assessment: Mild deficits  observed, not formally tested     ADL either performed or assessed with clinical judgement   ADL Overall ADL's : Needs assistance/impaired;At baseline Eating/Feeding: Modified independent;Sitting   Grooming: Wash/dry hands;Wash/dry face;Applying deodorant;Standing;Modified independent Grooming Details (indicate cue type and reason): Standing at sink for grooming Upper Body Bathing: Modified independent;Standing;Sitting   Lower Body Bathing: Modified independent;Sitting/lateral leans;Sit to/from stand   Upper Body Dressing : Sitting;Set up;Modified independent   Lower Body Dressing: Modified independent;Sitting/lateral leans;Sit to/from stand Lower Body Dressing Details (indicate cue type and reason): Sitting to don/doff socks using figure 4 technique Toilet Transfer: Modified Independent;Regular Toilet;Grab bars;Ambulation Toilet Transfer Details (indicate cue type and reason): Pt states that he has a countertop/sink that he uses when at boarding house where he rents a room [place] and Hygiene: Modified independent;Sitting/lateral lean;Sit to/from stand   Tub/ Shower Transfer: Walk-in shower;Supervision/safety;Ambulation Tub/Shower Transfer Details (indicate cue type and reason): Recommended sitting on shower seat if able Functional mobility during ADLs: Modified independent;Supervision/safety General ADL Comments: Pt was seen for Acute OT assessment today where he is overall Mod I-supervision for ADL's and functional mobility/transfers. He lives in a boarding house where he rents a room and states that he has intermittent assist from friends and church family. He appears to be at or near baseline level of function overall and denies any further acute OT needs at this time. Pt is hopeful to d/c home today, will sign off OT.     Vision Baseline Vision/History: 1 Wears glasses Ability to See in Adequate Light: 0 Adequate Patient Visual Report: No change from  baseline  Pertinent Vitals/Pain Pain Assessment Pain Assessment: No/denies pain     Hand Dominance Right   Extremity/Trunk Assessment Upper Extremity Assessment Upper Extremity Assessment: Overall WFL for tasks assessed   Lower Extremity Assessment Lower Extremity Assessment: Defer to PT evaluation;Overall WFL for tasks assessed       Communication     Cognition Arousal/Alertness: Awake/alert Behavior During Therapy: WFL for tasks assessed/performed Overall Cognitive Status: No family/caregiver present to determine baseline cognitive functioning   General Comments: Mostly intact. Occasional difficulty explaining home set up. Previous history of CVA                Home Living Family/patient expects to be discharged to:: Private residence Living Arrangements: Other (Comment) (rents a room in a boarding house) Available Help at Discharge: Available PRN/intermittently Type of Home: House Home Access: [place]: Two level;Able to live on main level with bedroom/bathroom Alternate Level Stairs-Number of Steps: flight   Bathroom Shower/Tub: Tub/shower unit;Walk-in shower   Bathroom Toilet: Standard Bathroom Accessibility: Yes   Home Equipment: Conservation officer, nature (2 wheels);Cane - single point   Additional Comments: Pt states that he has a friend who can help him with transportation occasionally      Prior Functioning/Environment Prior Level of Function : Independent/Modified Independent   Mobility Comments: Pt reports taht he takes a little extra time to move and is careful but did not need assistance and was not using an AD. ADLs Comments: Pt reports he is Mod I ADL's and IADL's.  likes to fiddle around with stuff but does not work and is on disability.        OT Problem List: Decreased knowledge of use of DME or AE;Decreased knowledge of precautions      OT Treatment/Interventions:   Eval only and sign off   OT  Goals(Current goals can be found in the care plan section) Acute Rehab OT Goals Patient Stated Goal: Go home today OT Goal Formulation: All assessment and education complete, DC therapy Time For Goal Achievement: 07/07/22 Potential to Achieve Goals: Good  OT Frequency:         AM-PAC OT "6 Clicks" Daily Activity     Outcome Measure Help from another person eating meals?: None Help from another person taking care of personal grooming?: None Help from another person toileting, which includes using toliet, bedpan, or urinal?: A Little Help from another person bathing (including washing, rinsing, drying)?: A Little Help from another person to put on and taking off regular upper body clothing?: None Help from another person to put on and taking off regular lower body clothing?: None 6 Click Score: 22   End of Session Equipment Utilized During Treatment: Gait belt Nurse Communication: Other (comment);Mobility status (Pt ADL's completed at sink level for bathing and dressing)  Activity Tolerance: Patient tolerated treatment well Patient left: in bed;with call bell/phone within reach;with nursing/sitter in room  OT Visit Diagnosis: Unsteadiness on feet (R26.81)                Time: 1694-5038 OT Time Calculation (min): 18 min Charges:  OT General Charges $OT Visit: 1 Visit OT Evaluation $OT Eval Low Complexity: 1 Low  Merdith Adan Beth Dixon, OTR/L 07/07/2022, 8:25 AM

## 2022-07-23 NOTE — Progress Notes (Deleted)
Office Visit    Patient Name: Shawn Collins Date of Encounter: 07/23/2022  PCP:  Nolene Ebbs, MD   [facility]  Cardiologist:  Fransico Him, MD  Advanced Practice Provider:  No care team member to display Electrophysiologist:  None   HPI    Shawn Collins is a 72 y.o. male with a past medical history of COPD, syncope and collapse, hypertension, hyperlipidemia, CAD, ER SD, type 2 diabetes mellitus presents today for hospital follow-up.  Patient presented to the ER for evaluation of syncope after dialysis.  Cardiology and nephrology were both consulted.  PT/OT was consulted.  Cardiology felt the patient's syncope was due to pulling off an extra liter volume during dialysis.  EKG showed no ischemia and high-sensitivity troponin was negative.  No arrhythmias on telemetry.  Echo showed LVEF 70 to 75%.  Cardiology discontinued the patient's Coreg due to the alpha blocker effects and instead placed the patient on metoprolol succinate 75 mg p.o. daily.  Hydralazine 25 mg p.o. 3 times daily, losartan 25 mg p.o. daily and amlodipine 2.5 mg p.o. daily were continued.  Aspirin and Plavix as well as Lipitor was continued.  Patient had dialysis in the hospital did not have any further syncopal events.  Evaluated by PT/OT and cleared for discharge.  Today, he***  Past Medical History    Past Medical History:  Diagnosis Date   CAD (coronary artery disease), native coronary artery    s/p NSTEMI with cath showing severe 3VD s/p  CABG x 3 with LIMA to LAD, SVG to OM1/OM2 and SVG D1 by Dr. Servando Snare on 10/26/16.   COPD (chronic obstructive pulmonary disease) ([place]) 09/10/2014   CVA (cerebral vascular accident) ([place]) 12/18/2021   Diabetes mellitus without complication ([place])    Type II   ESRD (end stage renal disease) ([place])    Hyperlipidemia LDL goal <70 04/17/2017   Hypertension    Past Surgical History:  Procedure Laterality Date   AV FISTULA PLACEMENT Left 12/21/2021    Procedure: INSERTION OF LEFT ARM ARTERIOVENOUS (AV) GORE-TEX GRAFT;  Surgeon: Angelia Mould, MD;  [facility]: [facility] OR;  Service: Vascular;  Laterality: Left;   CORONARY ARTERY BYPASS GRAFT N/A 10/26/2016   Procedure: CORONARY ARTERY BYPASS GRAFTING (CABG) x 4 USING LEFT INTERNAL MAMMARY ARTERY TO LAD AND ENDOSCOPICALLY HARVESTED GREATER SAPHENOUS VEIN TO OM 1, 2 AND TO DIAG;  Surgeon: Grace Isaac, MD;  Location: [place];  Service: Open Heart Surgery;  Laterality: N/A;   ENDOVEIN HARVEST OF GREATER SAPHENOUS VEIN Right 10/26/2016   Procedure: ENDOVEIN HARVEST OF GREATER SAPHENOUS VEIN;  Surgeon: Grace Isaac, MD;  Location: [place];  Service: Open Heart Surgery;  Laterality: Right;   IR THORACENTESIS ASP PLEURAL SPACE W/IMG GUIDE  11/03/2016   LEFT HEART CATH AND CORONARY ANGIOGRAPHY N/A 10/21/2016   Procedure: Left Heart Cath and Coronary Angiography;  Surgeon: Martinique, Peter M, MD;  Location: [place] CV LAB;  Service: Cardiovascular;  Laterality: N/A;   TEE WITHOUT CARDIOVERSION N/A 10/26/2016   Procedure: TRANSESOPHAGEAL ECHOCARDIOGRAM (TEE);  Surgeon: Grace Isaac, MD;  Location: [place];  Service: Open Heart Surgery;  Laterality: N/A;    Allergies  No Known Allergies  EKGs/Labs/Other Studies Reviewed:   The following studies were reviewed today:  Echo 07/05/2022  IMPRESSIONS     1. Left ventricular ejection fraction, by estimation, is 70 to 75%. The  left ventricle has hyperdynamic function. The left ventricle has no  regional wall motion abnormalities.  There is mild concentric left  ventricular hypertrophy. Left ventricular  diastolic parameters are indeterminate.   2. Right ventricular systolic function is normal. The right ventricular  size is normal.   3. The mitral valve is normal in structure. Trivial mitral valve  regurgitation. No evidence of mitral stenosis. Moderate mitral annular  calcification.   4. The aortic valve is tricuspid. There is mild  calcification of the  aortic valve. Aortic valve regurgitation is not visualized. Aortic valve  sclerosis/calcification is present, without any evidence of aortic  stenosis.   5. The inferior vena cava is normal in size with greater than 50%  respiratory variability, suggesting right atrial pressure of 3 mmHg.   Comparison(s): No significant change from prior study. Prior images  reviewed side by side.   FINDINGS   Left Ventricle: Left ventricular ejection fraction, by estimation, is 70  to 75%. The left ventricle has hyperdynamic function. The left ventricle  has no regional wall motion abnormalities. 3D left ventricular ejection  fraction analysis performed but not   reported based on interpreter judgement due to suboptimal tracking. The  left ventricular internal cavity size was normal in size. There is mild  concentric left ventricular hypertrophy. Left ventricular diastolic  parameters are indeterminate.   Right Ventricle: The right ventricular size is normal. No increase in  right ventricular wall thickness. Right ventricular systolic function is  normal.   Left Atrium: Left atrial size was normal in size.   Right Atrium: Right atrial size was normal in size.   Pericardium: There is no evidence of pericardial effusion.   Mitral Valve: The mitral valve is normal in structure. There is mild  thickening of the anterior mitral valve leaflet(s). There is mild  calcification of the anterior mitral valve leaflet(s). Moderate mitral  annular calcification. Trivial mitral valve  regurgitation. No evidence of mitral valve stenosis.   Tricuspid Valve: The tricuspid valve is normal in structure. Tricuspid  valve regurgitation is trivial.   Aortic Valve: The aortic valve is tricuspid. There is mild calcification  of the aortic valve. Aortic valve regurgitation is not visualized. Aortic  valve sclerosis/calcification is present, without any evidence of aortic  stenosis. Aortic valve  mean  gradient measures 6.5 mmHg. Aortic valve peak gradient measures 11.7 mmHg.  Aortic valve area, by VTI measures 2.26 cm.   Pulmonic Valve: The pulmonic valve was not well visualized. Pulmonic valve  regurgitation is not visualized.   Aorta: The aortic root is normal in size and structure.   Venous: The inferior vena cava is normal in size with greater than 50%  respiratory variability, suggesting right atrial pressure of 3 mmHg.   IAS/Shunts: No atrial level shunt detected by color flow Doppler.   EKG:  EKG is *** ordered today.  The ekg ordered today demonstrates ***  Recent Labs: 12/30/2021: TSH 1.563 07/07/2022: ALT 26; BUN 45; Creatinine, Ser 6.48; Hemoglobin 10.9; Magnesium 1.9; Platelets 218; Potassium 4.8; Sodium 132  Recent Lipid Panel    Component Value Date/Time   CHOL 160 12/19/2021 0559   CHOL 128 02/18/2020 0950   TRIG 86 12/23/2021 0455   HDL 44 12/19/2021 0559   HDL 40 02/18/2020 0950   CHOLHDL 3.6 12/19/2021 0559   VLDL 12 12/19/2021 0559   LDLCALC 104 (H) 12/19/2021 0559   LDLCALC 71 02/18/2020 0950    Risk Assessment/Calculations:  {Does this patient have ATRIAL FIBRILLATION?:[phone]}  Home Medications   No outpatient medications have been marked as taking for the 07/26/22  encounter (Appointment) with Elgie Collard, PA-C.     Review of Systems   ***   All other systems reviewed and are otherwise negative except as noted above.  Physical Exam    VS:  There were no vitals taken for this visit. , BMI There is no height or weight on file to calculate BMI.  Wt Readings from Last 3 Encounters:  07/07/22 132 lb 1.6 oz (59.9 kg)  12/31/21 119 lb 11.4 oz (54.3 kg)  12/27/21 126 lb 12.2 oz (57.5 kg)     GEN: Well nourished, well developed, in no acute distress. HEENT: normal. Neck: Supple, no JVD, carotid bruits, or masses. Cardiac: ***RRR, no murmurs, rubs, or gallops. No clubbing, cyanosis, edema.  ***Radials/PT 2+ and equal bilaterally.   Respiratory:  ***Respirations regular and unlabored, clear to auscultation bilaterally. GI: Soft, nontender, nondistended. MS: No deformity or atrophy. Skin: Warm and dry, no rash. Neuro:  Strength and sensation are intact. Psych: Normal affect.  Assessment & Plan    Syncope and collapse Hypertension Hyperlipidemia CAD Type 2 DM  No BP recorded.  {Refresh Note OR Click here to enter BP  :1}***      Disposition: Follow up {follow up:15908} with Fransico Him, MD or APP.  Signed, Elgie Collard, PA-C 07/23/2022, 3:53 PM [facility]

## 2022-07-26 ENCOUNTER — Ambulatory Visit: Payer: Medicare Other | Attending: Physician Assistant | Admitting: Physician Assistant

## 2022-07-26 DIAGNOSIS — I2510 Atherosclerotic heart disease of native coronary artery without angina pectoris: Secondary | ICD-10-CM

## 2022-07-26 DIAGNOSIS — E785 Hyperlipidemia, unspecified: Secondary | ICD-10-CM

## 2022-07-26 DIAGNOSIS — I10 Essential (primary) hypertension: Secondary | ICD-10-CM

## 2022-07-26 DIAGNOSIS — N183 Chronic kidney disease, stage 3 unspecified: Secondary | ICD-10-CM

## 2022-10-11 ENCOUNTER — Other Ambulatory Visit: Payer: Self-pay | Admitting: Internal Medicine

## 2022-10-12 LAB — LIPID PANEL
Cholesterol: 152 mg/dL (ref <200)
HDL: 47 mg/dL (ref >=40)
LDL Cholesterol (Calc): 81 mg/dL (calc)
Non-HDL Cholesterol (Calc): 105 mg/dL (calc) (ref <130)
Total CHOL/HDL Ratio: 3.2 (calc) (ref <5.0)
Triglycerides: 139 mg/dL (ref <150)

## 2022-10-12 LAB — CBC
HCT: 33 % — ABNORMAL LOW (ref 38.5–50.0)
Hemoglobin: 11 g/dL — ABNORMAL LOW (ref 13.2–17.1)
MCH: 31.2 pg (ref 27.0–33.0)
MCHC: 33.3 g/dL (ref 32.0–36.0)
MCV: 93.5 fL (ref 80.0–100.0)
MPV: 9.6 fL (ref 7.5–12.5)
Platelets: 270 10*3/uL (ref 140–400)
RBC: 3.53 10*6/uL — ABNORMAL LOW (ref 4.20–5.80)
RDW: 13.2 % (ref 11.0–15.0)
WBC: 5.3 10*3/uL (ref 3.8–10.8)

## 2022-10-12 LAB — COMPLETE METABOLIC PANEL WITH GFR
AG Ratio: 1.4 (calc) (ref 1.0–2.5)
ALT: 14 U/L (ref 9–46)
AST: 21 U/L (ref 10–35)
Albumin: 4.6 g/dL (ref 3.6–5.1)
Alkaline phosphatase (APISO): 59 U/L (ref 35–144)
BUN/Creatinine Ratio: 5 (calc) — ABNORMAL LOW (ref 6–22)
BUN: 41 mg/dL — ABNORMAL HIGH (ref 7–25)
CO2: 17 mmol/L — ABNORMAL LOW (ref 20–32)
Calcium: 8.5 mg/dL — ABNORMAL LOW (ref 8.6–10.3)
Chloride: 96 mmol/L — ABNORMAL LOW (ref 98–110)
Creat: 8.71 mg/dL — ABNORMAL HIGH (ref 0.70–1.28)
Globulin: 3.2 g/dL (calc) (ref 1.9–3.7)
Glucose, Bld: 119 mg/dL — ABNORMAL HIGH (ref 65–99)
Potassium: 5.6 mmol/L — ABNORMAL HIGH (ref 3.5–5.3)
Sodium: 139 mmol/L (ref 135–146)
Total Bilirubin: 0.4 mg/dL (ref 0.2–1.2)
Total Protein: 7.8 g/dL (ref 6.1–8.1)
eGFR: 6 mL/min/{1.73_m2} — ABNORMAL LOW (ref >=60)

## 2022-10-12 LAB — TSH: TSH: 1.64 mIU/L (ref 0.40–4.50)

## 2022-12-15 ENCOUNTER — Telehealth: Payer: Self-pay | Admitting: *Deleted

## 2022-12-15 NOTE — Telephone Encounter (Signed)
Attempted to contact the patient but his number is invalid.  Attempted to reach his niece but no answer left message requesting she have the patient contact the office. Contacted the patients friend, Bo Merino but it was a number to [employer]. Contacted the patients sister, Reuven Braver, but no answer and unable to leave a message.

## 2022-12-15 NOTE — Telephone Encounter (Signed)
   Pre-operative Risk Assessment    Patient Name: Shawn Collins  DOB: 03-Oct-1950 MRN: [id number]      Request for Surgical Clearance    Procedure:   Colonoscopy  Date of Surgery:  Clearance 01/05/23                                 Surgeon:  Dr. Jeani Hawking Surgeon's Group or Practice Name:  [facility] Phone number:  [phone] Fax number:  [phone]   Type of Clearance Requested:   - Medical  - Pharmacy:  Hold Aspirin and Clopidogrel (Plavix) Not indicated   Type of Anesthesia:   Propofol   Additional requests/questions:    Signed, Emmit Pomfret   12/15/2022, 8:03 AM

## 2022-12-15 NOTE — Telephone Encounter (Signed)
   Name: [name]  DOB: 1951-03-20  MRN: [id number]  Primary Cardiologist: Armanda Magic, MD  Chart reviewed as part of pre-operative protocol coverage. Because of Sadler Taborda's past medical history and time since last visit, he will require a follow-up in-office visit in order to better assess preoperative cardiovascular risk.  Pre-op covering staff: - Please schedule appointment and call patient to inform them. If patient already had an upcoming appointment within acceptable timeframe, please add "pre-op clearance" to the appointment notes so provider is aware. - Please contact requesting surgeon's office via preferred method (i.e, phone, fax) to inform them of need for appointment prior to surgery.    Joylene Grapes, NP  12/15/2022, 11:16 AM

## 2022-12-16 NOTE — Telephone Encounter (Signed)
We have tried to reach the pt to schedule an appt IN OFFICE for pre op clearance , though unable to reach pt. I will update the surgeon's office. Pt will need to call back to make IN OFFICE appt. Pt last seen by cardiology was 02/18/2020.

## 2022-12-21 NOTE — Telephone Encounter (Signed)
Our office has tried to make contact x3. I will notify surgeons office.

## 2023-02-09 ENCOUNTER — Ambulatory Visit (INDEPENDENT_AMBULATORY_CARE_PROVIDER_SITE_OTHER): Payer: Medicare Other | Admitting: Podiatry

## 2023-02-09 DIAGNOSIS — Z91199 Patient's noncompliance with other medical treatment and regimen due to unspecified reason: Secondary | ICD-10-CM

## 2023-02-12 NOTE — Progress Notes (Signed)
Patient was no-show for appointment today 

## 2023-03-31 ENCOUNTER — Emergency Department (HOSPITAL_COMMUNITY)
Admission: EM | Admit: 2023-03-31 | Discharge: 2023-04-01 | Disposition: A | Discharge status: Home / Self Care | Payer: Medicare Other | Attending: Emergency Medicine | Admitting: Emergency Medicine

## 2023-03-31 ENCOUNTER — Encounter (HOSPITAL_COMMUNITY): Payer: Self-pay

## 2023-03-31 ENCOUNTER — Other Ambulatory Visit: Payer: Self-pay

## 2023-03-31 ENCOUNTER — Emergency Department (HOSPITAL_COMMUNITY): Payer: Medicare Other

## 2023-03-31 DIAGNOSIS — Z992 Dependence on renal dialysis: Secondary | ICD-10-CM | POA: Diagnosis not present

## 2023-03-31 DIAGNOSIS — Z955 Presence of coronary angioplasty implant and graft: Secondary | ICD-10-CM | POA: Diagnosis not present

## 2023-03-31 DIAGNOSIS — N186 End stage renal disease: Secondary | ICD-10-CM | POA: Insufficient documentation

## 2023-03-31 DIAGNOSIS — I120 Hypertensive chronic kidney disease with stage 5 chronic kidney disease or end stage renal disease: Secondary | ICD-10-CM | POA: Insufficient documentation

## 2023-03-31 DIAGNOSIS — R1030 Lower abdominal pain, unspecified: Secondary | ICD-10-CM | POA: Diagnosis not present

## 2023-03-31 DIAGNOSIS — E119 Type 2 diabetes mellitus without complications: Secondary | ICD-10-CM | POA: Diagnosis not present

## 2023-03-31 DIAGNOSIS — I2510 Atherosclerotic heart disease of native coronary artery without angina pectoris: Secondary | ICD-10-CM | POA: Diagnosis not present

## 2023-03-31 DIAGNOSIS — Z79899 Other long term (current) drug therapy: Secondary | ICD-10-CM | POA: Diagnosis not present

## 2023-03-31 DIAGNOSIS — R4182 Altered mental status, unspecified: Secondary | ICD-10-CM | POA: Diagnosis present

## 2023-03-31 DIAGNOSIS — I160 Hypertensive urgency: Secondary | ICD-10-CM

## 2023-03-31 DIAGNOSIS — Z7982 Long term (current) use of aspirin: Secondary | ICD-10-CM | POA: Insufficient documentation

## 2023-03-31 LAB — CBC WITH DIFFERENTIAL/PLATELET
Abs Immature Granulocytes: 0.01 10*3/uL (ref 0.00–0.07)
Basophils Absolute: 0.1 10*3/uL (ref 0.0–0.1)
Basophils Relative: 1 %
Eosinophils Absolute: 0.1 10*3/uL (ref 0.0–0.5)
Eosinophils Relative: 2 %
HCT: 26.9 % — ABNORMAL LOW (ref 39.0–52.0)
Hemoglobin: 9.1 g/dL — ABNORMAL LOW (ref 13.0–17.0)
Immature Granulocytes: 0 %
Lymphocytes Relative: 12 %
Lymphs Abs: 0.5 10*3/uL — ABNORMAL LOW (ref 0.7–4.0)
MCH: 31.7 pg (ref 26.0–34.0)
MCHC: 33.8 g/dL (ref 30.0–36.0)
MCV: 93.7 fL (ref 80.0–100.0)
Monocytes Absolute: 0.4 10*3/uL (ref 0.1–1.0)
Monocytes Relative: 9 %
Neutro Abs: 3.3 10*3/uL (ref 1.7–7.7)
Neutrophils Relative %: 76 %
Platelets: 262 10*3/uL (ref 150–400)
RBC: 2.87 MIL/uL — ABNORMAL LOW (ref 4.22–5.81)
RDW: 13.8 % (ref 11.5–15.5)
WBC: 4.3 10*3/uL (ref 4.0–10.5)
nRBC: 0 % (ref 0.0–0.2)

## 2023-03-31 LAB — URINALYSIS, W/ REFLEX TO CULTURE (INFECTION SUSPECTED)
Bilirubin Urine: NEGATIVE
Glucose, UA: NEGATIVE mg/dL
Ketones, ur: NEGATIVE mg/dL
Nitrite: NEGATIVE
Protein, ur: 100 mg/dL — AB
Specific Gravity, Urine: 1.015 (ref 1.005–1.030)
pH: 5 (ref 5.0–8.0)

## 2023-03-31 LAB — I-STAT CHEM 8, ED
BUN: >130 mg/dL — ABNORMAL HIGH (ref 8–23)
Calcium, Ion: 0.91 mmol/L — ABNORMAL LOW (ref 1.15–1.40)
Chloride: 103 mmol/L (ref 98–111)
Creatinine, Ser: >18 mg/dL — ABNORMAL HIGH (ref 0.61–1.24)
Glucose, Bld: 69 mg/dL — ABNORMAL LOW (ref 70–99)
HCT: 25 % — ABNORMAL LOW (ref 39.0–52.0)
Hemoglobin: 8.5 g/dL — ABNORMAL LOW (ref 13.0–17.0)
Potassium: 6 mmol/L — ABNORMAL HIGH (ref 3.5–5.1)
Sodium: 133 mmol/L — ABNORMAL LOW (ref 135–145)
TCO2: 16 mmol/L — ABNORMAL LOW (ref 22–32)

## 2023-03-31 LAB — COMPREHENSIVE METABOLIC PANEL
ALT: 46 U/L — ABNORMAL HIGH (ref 0–44)
AST: 56 U/L — ABNORMAL HIGH (ref 15–41)
Albumin: 3.9 g/dL (ref 3.5–5.0)
Alkaline Phosphatase: 50 U/L (ref 38–126)
Anion gap: 21 — ABNORMAL HIGH (ref 5–15)
BUN: 176 mg/dL — ABNORMAL HIGH (ref 8–23)
CO2: 17 mmol/L — ABNORMAL LOW (ref 22–32)
Calcium: 8.2 mg/dL — ABNORMAL LOW (ref 8.9–10.3)
Chloride: 96 mmol/L — ABNORMAL LOW (ref 98–111)
Creatinine, Ser: 19.86 mg/dL — ABNORMAL HIGH (ref 0.61–1.24)
GFR, Estimated: 2 mL/min — ABNORMAL LOW (ref >60)
Glucose, Bld: 73 mg/dL (ref 70–99)
Potassium: 6 mmol/L — ABNORMAL HIGH (ref 3.5–5.1)
Sodium: 134 mmol/L — ABNORMAL LOW (ref 135–145)
Total Bilirubin: 0.7 mg/dL (ref 0.3–1.2)
Total Protein: 7.7 g/dL (ref 6.5–8.1)

## 2023-03-31 LAB — HEPATITIS B SURFACE ANTIGEN: Hepatitis B Surface Ag: NONREACTIVE

## 2023-03-31 LAB — CBG MONITORING, ED
Glucose-Capillary: 69 mg/dL — ABNORMAL LOW (ref 70–99)
Glucose-Capillary: 74 mg/dL (ref 70–99)
Glucose-Capillary: 142 mg/dL — ABNORMAL HIGH (ref 70–99)

## 2023-03-31 LAB — PHOSPHORUS: Phosphorus: 11 mg/dL — ABNORMAL HIGH (ref 2.5–4.6)

## 2023-03-31 MED ORDER — HEPARIN SODIUM (PORCINE) 1000 UNIT/ML DIALYSIS: 1000.0000 [IU] | INTRAMUSCULAR | Status: DC | PRN

## 2023-03-31 MED ORDER — HEPARIN SODIUM (PORCINE) 1000 UNIT/ML IJ SOLN
INTRAMUSCULAR | Status: AC
  Administered 2023-03-31: 3000 [IU] via INTRAVENOUS_CENTRAL
  Filled 2023-03-31: qty 3

## 2023-03-31 MED ORDER — HEPARIN SODIUM (PORCINE) 1000 UNIT/ML IJ SOLN: 2000.0000 [IU] | Freq: Once | INTRAMUSCULAR | Status: DC

## 2023-03-31 MED ORDER — CHLORHEXIDINE GLUCONATE CLOTH 2 % EX PADS: 6.0000 | MEDICATED_PAD | Freq: Every day | CUTANEOUS | Status: DC

## 2023-03-31 MED ORDER — HEPARIN SODIUM (PORCINE) 1000 UNIT/ML DIALYSIS: 3000.0000 [IU] | Freq: Once | INTRAMUSCULAR | Status: AC

## 2023-03-31 MED ORDER — DOXERCALCIFEROL 4 MCG/2ML IV SOLN
3.0000 ug | INTRAVENOUS | Status: DC
  Administered 2023-03-31: 3 ug via INTRAVENOUS
  Filled 2023-03-31 (×2): qty 2

## 2023-03-31 MED ORDER — SUCROFERRIC OXYHYDROXIDE 500 MG PO CHEW
500.0000 mg | CHEWABLE_TABLET | Freq: Three times a day (TID) | ORAL | Status: DC
  Filled 2023-03-31 (×2): qty 1

## 2023-03-31 MED ORDER — CINACALCET HCL 30 MG PO TABS
30.0000 mg | ORAL_TABLET | ORAL | Status: DC
  Administered 2023-03-31: 30 mg via ORAL
  Filled 2023-03-31: qty 1

## 2023-03-31 MED ORDER — IOHEXOL 350 MG/ML SOLN
75.0000 mL | Freq: Once | INTRAVENOUS | Status: AC | PRN
  Administered 2023-03-31: 75 mL via INTRAVENOUS

## 2023-03-31 MED ORDER — HEPARIN SODIUM (PORCINE) 1000 UNIT/ML IJ SOLN: 3000.0000 [IU] | Freq: Once | INTRAMUSCULAR | Status: DC

## 2023-03-31 MED ORDER — ALBUTEROL SULFATE (2.5 MG/3ML) 0.083% IN NEBU
10.0000 mg | INHALATION_SOLUTION | Freq: Once | RESPIRATORY_TRACT | Status: AC
  Administered 2023-03-31: 10 mg via RESPIRATORY_TRACT
  Filled 2023-03-31: qty 12

## 2023-03-31 MED ORDER — HEPARIN SODIUM (PORCINE) 1000 UNIT/ML DIALYSIS
2000.0000 [IU] | INTRAMUSCULAR | Status: AC | PRN
  Administered 2023-03-31: 2000 [IU] via INTRAVENOUS_CENTRAL

## 2023-03-31 MED ORDER — LABETALOL HCL 5 MG/ML IV SOLN
20.0000 mg | INTRAVENOUS | Status: DC | PRN
Start: 2023-03-31 — End: 2023-04-01
  Administered 2023-03-31: 20 mg via INTRAVENOUS
  Filled 2023-03-31: qty 4

## 2023-03-31 MED ORDER — ALTEPLASE 2 MG IJ SOLR: 2.0000 mg | Freq: Once | INTRAMUSCULAR | Status: DC | PRN

## 2023-03-31 MED ORDER — SODIUM ZIRCONIUM CYCLOSILICATE 10 G PO PACK
10.0000 g | PACK | Freq: Once | ORAL | Status: AC
  Administered 2023-03-31: 10 g via ORAL
  Filled 2023-03-31: qty 1

## 2023-03-31 MED ORDER — HYDRALAZINE HCL 20 MG/ML IJ SOLN: 10.0000 mg | INTRAMUSCULAR | Status: DC

## 2023-03-31 MED ORDER — DARBEPOETIN ALFA 40 MCG/0.4ML IJ SOSY
40.0000 ug | PREFILLED_SYRINGE | INTRAMUSCULAR | Status: DC
  Filled 2023-03-31: qty 0.4

## 2023-03-31 NOTE — ED Triage Notes (Signed)
Pt BIB EMS from home due to AMS. Pt missed dialysis for 11 days. Pt states he didn't feel good doing dialysis so he stopped. C/O brain fog and weakness. 234 systolic  by EMS. Axox4 on arrival.

## 2023-03-31 NOTE — ED Provider Notes (Signed)
Care of the patient assumed at signout.  Prior to my opportunity to evaluate the patient he went for dialysis.  CT reviewed without acute hepatobiliary dysfunction, which was a concern.   Gerhard Munch, MD 03/31/23 1705

## 2023-03-31 NOTE — ED Notes (Signed)
Pt transported to HD.

## 2023-03-31 NOTE — ED Notes (Signed)
RN paused continuous neb so pt could eat and drink due to blood sugar being 69

## 2023-03-31 NOTE — Consult Note (Addendum)
Renal Service Consult Note [facility]  Shawn Collins 03/31/2023 [name], MD Requesting Physician: Dr. Anitra Lauth  Reason for Consult: ESRD pt w/ gen'd weakness  HPI: The patient is a 71 y.o. year-old w/ PMH as below who presented to ED c/o gen'd weakness. He was "feeling bad" so he stopped going to his dialysis and has missed about 4 HD sessions. In ED BP 230/83, HR 65, RR 15, temp 97, RA 100%.  wBC 4K Hb 8.5  K+ 6.0  BUN 176, creat 19.8.  CO2 17. Alb 3.9.  Pt is to be admitted  Main c/o fatigue, weakness, and "nerve pains" over his stomach and anterior chest.  Not sure if these are his current symptoms or the reason he stopped coming to dialysis. He is difficult to understand w/ the O2 mask on. He denies any SOB, orthopnea or LE edema.   ROS - denies CP, no joint pain, no HA, no blurry vision, no rash, no diarrhea, no nausea/ vomiting   Past Medical History  Past Medical History:  Diagnosis Date   CAD (coronary artery disease), native coronary artery    s/p NSTEMI with cath showing severe 3VD s/p  CABG x 3 with LIMA to LAD, SVG to OM1/OM2 and SVG D1 by Dr. Tyrone Sage on 10/26/16.   COPD (chronic obstructive pulmonary disease) ([facility]) 09/10/2014   CVA (cerebral vascular accident) ([facility]) 12/18/2021   Diabetes mellitus without complication ([facility])    Type II   ESRD (end stage renal disease) ([facility])    Hyperlipidemia LDL goal <70 04/17/2017   Hypertension    Past Surgical History  Past Surgical History:  Procedure Laterality Date   AV FISTULA PLACEMENT Left 12/21/2021   Procedure: INSERTION OF LEFT ARM ARTERIOVENOUS (AV) GORE-TEX GRAFT;  Surgeon: Chuck Hint, MD;  Location: [facility] OR;  Service: Vascular;  Laterality: Left;   CORONARY ARTERY BYPASS GRAFT N/A 10/26/2016   Procedure: CORONARY ARTERY BYPASS GRAFTING (CABG) x 4 USING LEFT INTERNAL MAMMARY ARTERY TO LAD AND ENDOSCOPICALLY HARVESTED GREATER SAPHENOUS VEIN TO OM 1, 2 AND TO DIAG;  Surgeon: Delight Ovens, MD;  Location: [facility] OR;  Service: Open Heart Surgery;  Laterality: N/A;   ENDOVEIN HARVEST OF GREATER SAPHENOUS VEIN Right 10/26/2016   Procedure: ENDOVEIN HARVEST OF GREATER SAPHENOUS VEIN;  Surgeon: Delight Ovens, MD;  Location: [facility] OR;  Service: Open Heart Surgery;  Laterality: Right;   IR THORACENTESIS ASP PLEURAL SPACE W/IMG GUIDE  11/03/2016   LEFT HEART CATH AND CORONARY ANGIOGRAPHY N/A 10/21/2016   Procedure: Left Heart Cath and Coronary Angiography;  Surgeon: Swazil[place], Peter M, MD;  Location: [facility] INVASIVE CV LAB;  Service: Cardiovascular;  Laterality: N/A;   TEE WITHOUT CARDIOVERSION N/A 10/26/2016   Procedure: TRANSESOPHAGEAL ECHOCARDIOGRAM (TEE);  Surgeon: Delight Ovens, MD;  Location: [facility] OR;  Service: Open Heart Surgery;  Laterality: N/A;   Family History  Family History  Problem Relation Age of Onset   Diabetes Mellitus II Mother    Stroke Father    Hypertension Father    Diabetes Mellitus II Sister    Social History  reports that he quit smoking about 52 years ago. His smoking use included cigarettes. He started smoking about 60 years ago. He has a 2 pack-year smoking history. He has never used smokeless tobacco. He reports that he does not currently use alcohol. He reports that he does not currently use drugs. Allergies No Known Allergies Home medications Prior to Admission medications   Medication Sig  Start Date End Date Taking? Authorizing Provider  amLODipine (NORVASC) 2.5 MG tablet Take 1 tablet (2.5 mg total) by mouth at bedtime. 07/07/22 08/06/22  Baron Hamper, MD  aspirin EC 81 MG tablet Take 1 tablet (81 mg total) by mouth daily. Swallow whole. 07/07/22   Baron Hamper, MD  atorvastatin (LIPITOR) 40 MG tablet Take 1 tablet (40 mg total) by mouth daily. 07/07/22   Baron Hamper, MD  clopidogrel (PLAVIX) 75 MG tablet Take 1 tablet (75 mg total) by mouth daily. 12/29/21   de Saintclair Halsted, Cortney E, NP  doxercalciferol (HECTOROL) 4 MCG/2ML injection Inject 2 mLs (4 mcg total) into  the vein every Monday, Wednesday, and Friday with hemodialysis. 12/28/21   de Saintclair Halsted, Cortney E, NP  furosemide (LASIX) 40 MG tablet Take 40 mg by mouth daily.    [provider]  hydrALAZINE (APRESOLINE) 25 MG tablet Take 1 tablet (25 mg total) by mouth 3 (three) times daily. 07/07/22 08/06/22  Baron Hamper, MD  losartan (COZAAR) 25 MG tablet Take 1 tablet (25 mg total) by mouth daily. 07/07/22 08/06/22  Baron Hamper, MD  metoprolol succinate (TOPROL-XL) 25 MG 24 hr tablet Take 3 tablets (75 mg total) by mouth daily. 07/08/22 08/07/22  Baron Hamper, MD  nitroGLYCERIN (NITROSTAT) 0.4 MG SL tablet Place 0.4 mg under the tongue every 5 (five) minutes as needed for chest pain.    [provider]  VELPHORO 500 MG chewable tablet Chew 500 mg by mouth 3 (three) times daily. 06/04/21   [provider]     Vitals:   03/31/23 1145 03/31/23 1320 03/31/23 1322 03/31/23 1323  BP:    (!) 230/101  Pulse: 66 68  65  Resp: 14 18  16   Temp:   (!) 97.4 F (36.3 C)   TempSrc:   Oral   SpO2: 100% 99%  100%  Weight:      Height:       Exam Gen alert, no distress, getting albuterol nebs for ^K No rash, cyanosis or gangrene Sclera anicteric, throat clear  No jvd or bruits Chest clear bilat to bases, no rales/ wheezing RRR no RG Abd soft ntnd no mass or ascites +bs GU defer MS no joint effusions or deformity Ext no LE or UE edema, no wounds or ulcers Neuro is alert, Ox 3 , nf, no asterixis    LFA AVG+bruit       Renal-related home meds: - norvasc 2.5 hs - asa/ plavix - lasix 40 every day - hydralazine 25 tid - losartan 25 every day - toprol xl 75mg  every day - velphoro 500mg  ac tid    OP HD: [facility] MWF  3.5h  250/500   55.1kg   2/2.5 bath  LFA AVG   Heparin 5000 - last OP HD 10/21, post wt 56.2kg   - missed the last 4 sessions - was getting to dry wt when coming - sensipar 30mg  - hectorol 3 mcg  - venofer 50mg  weekly - mircera 30 mcg IV q 2 wks, ordered 10/03, hasn't  rec'd it yet that I can see   Assessment/ Plan: Gen'd weakness - and also brain fog per patient. Stopped going to dialysis because he didn't feel good. Probably is uremic, but his "nerve pains" not sure are related. Plan HD as below.  ESKD - on HD MWF. Last HD was 10/21, missed 4 sessions. Plan HD today / tonight w/ 3 hrs. May need extra HD tomorrow as well for 3 hr given  admission azotemia.  HTN/ BP - BP's very high in ED 230/83 Hyperkalemia - getting temporizing measures. Low K+ bath w/ HD.  Volume - has been getting to dry wt. No edema on exam, not requiring O2 here, CXR w/ chronic pleura calcifications but not wet.  Anemia of eskd - Hb 8-10 here. Was supposed to start esa this month but never given. Will start darbe 40 mcg weekly sq while here. Tsat was 54% on 10/14 at outpt unit.   MBD ckd - CCa in range, add on phos. Cont sensipar, IV vdra and velphoro as binder.  H/o CABG - in 2018 H/o COPD      Vinson Moselle  MD [facility] 03/31/2023, 1:55 PM  Recent Labs  Lab 03/31/23 1151 03/31/23 1210  HGB 9.1* 8.5*  ALBUMIN 3.9  --   CALCIUM 8.2*  --   CREATININE 19.86* >18.00*  K 6.0* 6.0*   Inpatient medications:

## 2023-03-31 NOTE — Progress Notes (Signed)
Received patient in bed to unit.  Alert and orientedx4 Informed consent signed and in chart.   TX duration:2hours 45 minutes  Patient tolerated well.  Transported back to the room  Alert, without acute distress.  Hand-off given to patient's nurse.   Access used: AVF/G Access issues: NONE  Total UF removed: 2000 Medication(s) given: sensipar 30mg  tab, hectorol , heparin 5,000 units  Post HD VS: see table below Post HD weight: 56.7kg standing scale    03/31/23 2027  Vitals  BP (!) 225/106  MAP (mmHg) 140  BP Location Right Arm  BP Method Automatic  Patient Position (if appropriate) Lying  Pulse Rate 81  Pulse Rate Source Monitor  ECG Heart Rate 81  Resp 17  Oxygen Therapy  SpO2 100 %  O2 Device Room Air  Patient Activity (if Appropriate) In bed  Pulse Oximetry Type Continuous  During Treatment Monitoring  Blood Flow Rate (mL/min) 0 mL/min  Arterial Pressure (mmHg) -0.61 mmHg  Venous Pressure (mmHg) -1.82 mmHg  TMP (mmHg) -51.11 mmHg  Ultrafiltration Rate (mL/min) 1875 mL/min  Dialysate Flow Rate (mL/min) 0 ml/min  Dialysate Potassium Concentration 2  Dialysate Calcium Concentration 2.5  Duration of HD Treatment -hour(s) 2.75 hour(s)  Cumulative Fluid Removed (mL) per Treatment  2000.29  HD Safety Checks Performed Yes  Intra-Hemodialysis Comments Tolerated well  Post Treatment  Dialyzer Clearance Clear  Hemodialysis Intake (mL) 100 mL  Liters Processed 49.5  Fluid Removed (mL) 2000 mL  Tolerated HD Treatment Yes  Post-Hemodialysis Comments goal met  AVG/AVF Arterial Site Held (minutes) 15 minutes  AVG/AVF Venous Site Held (minutes) 10 minutes  Fistula / Graft Left Upper arm Arteriovenous vein graft  Placement Date/Time: 12/21/21 1103   Orientation: Left  Access Location: Upper arm  Access Type: Arteriovenous vein graft  Site Condition No complications  Fistula / Graft Assessment Present;Thrill;Bruit  Status Deaccessed;Flushed;Patent      [facility]

## 2023-03-31 NOTE — Procedures (Signed)
HD Note:  Some information was entered later than the data was gathered due to patient care needs. The stated time with the data is accurate.  Received patient in bed to unit.   Alert and oriented.   Informed consent signed and in chart.   Access used: left lower arm graft Access issues: None  1K acid bath used for the first 90 min, 2K switched out per perscription  Patient tolerating treatment thus far.  Hand off given to oncoming dialysis nurse.  Nhyira Leano L. Dareen Piano, RN [facility].

## 2023-03-31 NOTE — ED Provider Notes (Signed)
[place] EMERGENCY DEPARTMENT AT [facility] Provider Note   CSN: [id number] Arrival date & time: 03/31/23  1132     History  Chief Complaint  Patient presents with   Altered Mental Status    Shawn Collins is a 72 y.o. male.  Patient is a 72 year old male with multiple medical problems including end-stage renal disease on dialysis Monday Wednesday Friday, CAD status post CABG, hypertension, diabetes and hyperlipidemia who is presenting today with multiple vague complaints.  Patient reports that for the last week and a half he is just felt unwell.  It is gradually worsening but initially started with pain in his groin.  He reports that will intermittently swell and be very painful.  He denies any urinary symptoms associated with this has not noticed any fever denies any nausea or vomiting.  He reports he has been feeling so bad he has not gone to dialysis but the unit called him today and said it has been 11 days since he has been to dialysis.  He reports normally he is good about going to dialysis regularly on schedule.  He has not had any chest pain or shortness of breath.  He does still make urine but has not had any dysuria frequency or urgency.  He reports that he is still eating but he does not eat much.  He just has continued to feel weaker and weaker and does not feel like doing anything.  Also he has felt a little bit confused.  He also reports that he has had no change in his medications but he cannot remember the last time he actually took his medications and he needs to be better about taking them.  He denies alcohol or drug use  The history is provided by the patient.  Altered Mental Status      Home Medications Prior to Admission medications   Medication Sig Start Date End Date Taking? Authorizing Provider  amLODipine (NORVASC) 2.5 MG tablet Take 1 tablet (2.5 mg total) by mouth at bedtime. 07/07/22 08/06/22  Baron Hamper, MD  aspirin EC 81 MG tablet Take 1 tablet  (81 mg total) by mouth daily. Swallow whole. 07/07/22   Baron Hamper, MD  atorvastatin (LIPITOR) 40 MG tablet Take 1 tablet (40 mg total) by mouth daily. 07/07/22   Baron Hamper, MD  clopidogrel (PLAVIX) 75 MG tablet Take 1 tablet (75 mg total) by mouth daily. 12/29/21   de Saintclair Halsted, Cortney E, NP  doxercalciferol (HECTOROL) 4 MCG/2ML injection Inject 2 mLs (4 mcg total) into the vein every Monday, Wednesday, and Friday with hemodialysis. 12/28/21   de Saintclair Halsted, Cortney E, NP  furosemide (LASIX) 40 MG tablet Take 40 mg by mouth daily.    [provider]  hydrALAZINE (APRESOLINE) 25 MG tablet Take 1 tablet (25 mg total) by mouth 3 (three) times daily. 07/07/22 08/06/22  Baron Hamper, MD  losartan (COZAAR) 25 MG tablet Take 1 tablet (25 mg total) by mouth daily. 07/07/22 08/06/22  Baron Hamper, MD  metoprolol succinate (TOPROL-XL) 25 MG 24 hr tablet Take 3 tablets (75 mg total) by mouth daily. 07/08/22 08/07/22  Baron Hamper, MD  nitroGLYCERIN (NITROSTAT) 0.4 MG SL tablet Place 0.4 mg under the tongue every 5 (five) minutes as needed for chest pain.    [provider]  VELPHORO 500 MG chewable tablet Chew 500 mg by mouth 3 (three) times daily. 06/04/21   [provider]      Allergies    Patient  has no known allergies.    Review of Systems   Review of Systems  Physical Exam Updated Vital Signs BP (!) 230/101 (BP Location: Right Arm)   Pulse 65   Temp (!) 97.4 F (36.3 C) (Oral)   Resp 16   Ht 5\' 10"  (1.778 m)   Wt 56.7 kg   SpO2 100%   BMI 17.94 kg/m  Physical Exam Vitals and nursing note reviewed.  Constitutional:      General: He is not in acute distress.    Appearance: He is well-developed.  HENT:     Head: Normocephalic and atraumatic.  Eyes:     Pupils: Pupils are equal, round, and reactive to light.     Comments: Pale conjunctiva  Cardiovascular:     Rate and Rhythm: Normal rate and regular rhythm.     Heart sounds: No murmur heard. Pulmonary:     Effort:  Pulmonary effort is normal. No respiratory distress.     Breath sounds: Normal breath sounds. No wheezing or rales.  Abdominal:     General: There is no distension.     Palpations: Abdomen is soft.     Tenderness: There is no abdominal tenderness. There is no guarding or rebound.     Hernia: A hernia is present. Hernia is present in the right inguinal area.     Comments: Appears to have hernia in the right inguinal area that goes down into the testicle.  Significant tenderness with palpation of the right testicle.  No skin changes in the groin region concerning for cellulitis or abscess  Musculoskeletal:        General: No tenderness. Normal range of motion.     Cervical back: Normal range of motion and neck supple.     Right lower leg: No edema.     Left lower leg: No edema.     Comments: Graft present with palpable thrill in the left arm  Skin:    General: Skin is warm and dry.     Coloration: Skin is pale.     Findings: No erythema or rash.  Neurological:     Mental Status: He is alert and oriented to person, place, and time. Mental status is at baseline.     Sensory: No sensory deficit.  Psychiatric:        Behavior: Behavior normal.     ED Results / Procedures / Treatments   Labs (all labs ordered are listed, but only abnormal results are displayed) Labs Reviewed  CBC WITH DIFFERENTIAL/PLATELET - Abnormal; Notable for the following components:      Result Value   RBC 2.87 (*)    Hemoglobin 9.1 (*)    HCT 26.9 (*)    Lymphs Abs 0.5 (*)    All other components within normal limits  COMPREHENSIVE METABOLIC PANEL - Abnormal; Notable for the following components:   Sodium 134 (*)    Potassium 6.0 (*)    Chloride 96 (*)    CO2 17 (*)    BUN 176 (*)    Creatinine, Ser 19.86 (*)    Calcium 8.2 (*)    AST 56 (*)    ALT 46 (*)    GFR, Estimated 2 (*)    Anion gap 21 (*)    All other components within normal limits  I-STAT CHEM 8, ED - Abnormal; Notable for the following  components:   Sodium 133 (*)    Potassium 6.0 (*)    BUN >130 (*)  Creatinine, Ser >18.00 (*)    Glucose, Bld 69 (*)    Calcium, Ion 0.91 (*)    TCO2 16 (*)    Hemoglobin 8.5 (*)    HCT 25.0 (*)    All other components within normal limits  CBG MONITORING, ED - Abnormal; Notable for the following components:   Glucose-Capillary 69 (*)    All other components within normal limits  URINALYSIS, W/ REFLEX TO CULTURE (INFECTION SUSPECTED)    EKG EKG Interpretation Date/Time:  Friday March 31 2023 11:40:15 EDT Ventricular Rate:  66 PR Interval:  162 QRS Duration:  91 QT Interval:  442 QTC Calculation: 464 R Axis:   18  Text Interpretation: Sinus rhythm Borderline low voltage, extremity leads Baseline wander in lead(s) V1 slightly peaked t-waves Confirmed by Gwyneth Sprout ([id number]) on 03/31/2023 11:57:54 AM  Radiology No results found.  Procedures Procedures    Medications Ordered in ED Medications  sodium zirconium cyclosilicate (LOKELMA) packet 10 g (10 g Oral Given 03/31/23 1320)  albuterol (PROVENTIL) (2.5 MG/3ML) 0.083% nebulizer solution 10 mg (10 mg Nebulization Given 03/31/23 1320)    ED Course/ Medical Decision Making/ A&P                                 Medical Decision Making Amount and/or Complexity of Data Reviewed Labs: ordered. Radiology: ordered.  Risk Prescription drug management.   Pt with multiple medical problems and comorbidities and presenting today with a complaint that caries a high risk for morbidity and mortality.  Here today with multiple complaints but concern for uremia, hyperkalemia due to missing 4 dialysis treatments.  Also concern for possible incarcerated hernia as patient is having pain and has swelling in his right groin and testicle area versus epididymitis.  Lower suspicion for sepsis at this time.  Hemodynamically stable no significant signs of fluid overload.  Patient is not having any chest pain or shortness of breath.   Imaging and scan pending.  1:29 PM I independently interpreted patient's EKG and labs.  EKG with slight increased amplitude of T waves throughout but no other acute findings.  CBC with increased anemia with hemoglobin of 9 today from 11 on prior, normal white count and platelets, CMP with uremia today with a creatinine of almost 20, BUN of 176 with an anion gap of 21 and mild hyperkalemia at 6.0.  Patient given albuterol, Lokelma but due to his blood sugar of 69 insulin was held.  Discussed and consulted with nephrology Dr. Arlean Hopping who will consult on the patient for dialysis.  CRITICAL CARE Performed by: Piercen Covino Total critical care time: 30 minutes Critical care time was exclusive of separately billable procedures and treating other patients. Critical care was necessary to treat or prevent imminent or life-threatening deterioration. Critical care was time spent personally by me on the following activities: development of treatment plan with patient and/or surrogate as well as nursing, discussions with consultants, evaluation of patient's response to treatment, examination of patient, obtaining history from patient or surrogate, ordering and performing treatments and interventions, ordering and review of laboratory studies, ordering and review of radiographic studies, pulse oximetry and re-evaluation of patient's condition.        Final Clinical Impression(s) / ED Diagnoses Final diagnoses:  None    Rx / DC Orders ED Discharge Orders     None         Gwyneth Sprout, MD 03/31/23 1600

## 2023-04-01 LAB — HEPATITIS B SURFACE ANTIBODY, QUANTITATIVE: Hep B S AB Quant (Post): 302 m[IU]/mL

## 2023-04-01 NOTE — ED Provider Notes (Signed)
Patient seen on return from dialysis.  He has no complaints, states that he feels better, has no increased work of breathing.  However, the patient is found to have systolic greater than 230.  It is noted that the patient acknowledged taking no blood pressure medication or any medication recently.  Patient received labetalol, hydralazine, had decrease in blood pressure 10%, continue to have no complaints, stated that he will resume taking his medication will follow-up with his physician and will go to dialysis as scheduled.   Gerhard Munch, MD 04/01/23 [phone]

## 2023-04-01 NOTE — ED Notes (Signed)
[employer] called [facility] pick up main entrance

## 2023-04-12 ENCOUNTER — Emergency Department (HOSPITAL_COMMUNITY)
Admission: EM | Admit: 2023-04-12 | Discharge: 2023-04-13 | Disposition: A | Discharge status: Home / Self Care | Payer: Medicare Other | Attending: Emergency Medicine | Admitting: Emergency Medicine

## 2023-04-12 ENCOUNTER — Encounter (HOSPITAL_COMMUNITY): Payer: Self-pay

## 2023-04-12 ENCOUNTER — Emergency Department (HOSPITAL_COMMUNITY): Payer: Medicare Other

## 2023-04-12 DIAGNOSIS — I10 Essential (primary) hypertension: Secondary | ICD-10-CM

## 2023-04-12 DIAGNOSIS — I6782 Cerebral ischemia: Secondary | ICD-10-CM | POA: Diagnosis not present

## 2023-04-12 DIAGNOSIS — J449 Chronic obstructive pulmonary disease, unspecified: Secondary | ICD-10-CM | POA: Insufficient documentation

## 2023-04-12 DIAGNOSIS — E1122 Type 2 diabetes mellitus with diabetic chronic kidney disease: Secondary | ICD-10-CM | POA: Diagnosis not present

## 2023-04-12 DIAGNOSIS — Z79899 Other long term (current) drug therapy: Secondary | ICD-10-CM | POA: Diagnosis not present

## 2023-04-12 DIAGNOSIS — R112 Nausea with vomiting, unspecified: Secondary | ICD-10-CM | POA: Insufficient documentation

## 2023-04-12 DIAGNOSIS — Z950 Presence of cardiac pacemaker: Secondary | ICD-10-CM | POA: Diagnosis not present

## 2023-04-12 DIAGNOSIS — Z992 Dependence on renal dialysis: Secondary | ICD-10-CM | POA: Insufficient documentation

## 2023-04-12 DIAGNOSIS — N189 Chronic kidney disease, unspecified: Secondary | ICD-10-CM | POA: Insufficient documentation

## 2023-04-12 DIAGNOSIS — I129 Hypertensive chronic kidney disease with stage 1 through stage 4 chronic kidney disease, or unspecified chronic kidney disease: Secondary | ICD-10-CM | POA: Insufficient documentation

## 2023-04-12 DIAGNOSIS — Z7982 Long term (current) use of aspirin: Secondary | ICD-10-CM | POA: Insufficient documentation

## 2023-04-12 DIAGNOSIS — R110 Nausea: Secondary | ICD-10-CM

## 2023-04-12 LAB — COMPREHENSIVE METABOLIC PANEL
ALT: 38 U/L (ref 0–44)
AST: 40 U/L (ref 15–41)
Albumin: 3.9 g/dL (ref 3.5–5.0)
Alkaline Phosphatase: 59 U/L (ref 38–126)
Anion gap: 13 (ref 5–15)
BUN: 12 mg/dL (ref 8–23)
CO2: 28 mmol/L (ref 22–32)
Calcium: 8.8 mg/dL — ABNORMAL LOW (ref 8.9–10.3)
Chloride: 95 mmol/L — ABNORMAL LOW (ref 98–111)
Creatinine, Ser: 3.93 mg/dL — ABNORMAL HIGH (ref 0.61–1.24)
GFR, Estimated: 15 mL/min — ABNORMAL LOW (ref >60)
Glucose, Bld: 68 mg/dL — ABNORMAL LOW (ref 70–99)
Potassium: 3.9 mmol/L (ref 3.5–5.1)
Sodium: 136 mmol/L (ref 135–145)
Total Bilirubin: 0.8 mg/dL (ref <1.2)
Total Protein: 7.7 g/dL (ref 6.5–8.1)

## 2023-04-12 LAB — URINALYSIS, ROUTINE W REFLEX MICROSCOPIC
Bilirubin Urine: NEGATIVE
Glucose, UA: 50 mg/dL — AB
Ketones, ur: NEGATIVE mg/dL
Nitrite: NEGATIVE
Protein, ur: 100 mg/dL — AB
Specific Gravity, Urine: 1.01 (ref 1.005–1.030)
pH: 9 — ABNORMAL HIGH (ref 5.0–8.0)

## 2023-04-12 LAB — CBC WITH DIFFERENTIAL/PLATELET
Abs Immature Granulocytes: 0.02 10*3/uL (ref 0.00–0.07)
Basophils Absolute: 0.1 10*3/uL (ref 0.0–0.1)
Basophils Relative: 1 %
Eosinophils Absolute: 0 10*3/uL (ref 0.0–0.5)
Eosinophils Relative: 1 %
HCT: 31.1 % — ABNORMAL LOW (ref 39.0–52.0)
Hemoglobin: 9.7 g/dL — ABNORMAL LOW (ref 13.0–17.0)
Immature Granulocytes: 0 %
Lymphocytes Relative: 9 %
Lymphs Abs: 0.5 10*3/uL — ABNORMAL LOW (ref 0.7–4.0)
MCH: 31.4 pg (ref 26.0–34.0)
MCHC: 31.2 g/dL (ref 30.0–36.0)
MCV: 100.6 fL — ABNORMAL HIGH (ref 80.0–100.0)
Monocytes Absolute: 0.7 10*3/uL (ref 0.1–1.0)
Monocytes Relative: 11 %
Neutro Abs: 4.8 10*3/uL (ref 1.7–7.7)
Neutrophils Relative %: 78 %
Platelets: 262 10*3/uL (ref 150–400)
RBC: 3.09 MIL/uL — ABNORMAL LOW (ref 4.22–5.81)
RDW: 14.3 % (ref 11.5–15.5)
WBC: 6.1 10*3/uL (ref 4.0–10.5)
nRBC: 0 % (ref 0.0–0.2)

## 2023-04-12 LAB — TROPONIN I (HIGH SENSITIVITY): Troponin I (High Sensitivity): 39 ng/L — ABNORMAL HIGH (ref <18)

## 2023-04-12 LAB — LIPASE, BLOOD: Lipase: 48 U/L (ref 11–51)

## 2023-04-12 MED ORDER — HYDRALAZINE HCL 20 MG/ML IJ SOLN
10.0000 mg | Freq: Once | INTRAMUSCULAR | Status: AC
  Administered 2023-04-12: 10 mg via INTRAVENOUS
  Filled 2023-04-12: qty 1

## 2023-04-12 MED ORDER — AMLODIPINE BESYLATE 5 MG PO TABS
5.0000 mg | ORAL_TABLET | Freq: Every day | ORAL | Status: DC
  Administered 2023-04-12: 5 mg via ORAL
  Filled 2023-04-12: qty 1

## 2023-04-12 NOTE — ED Notes (Signed)
Patient transported to x-ray. ?

## 2023-04-12 NOTE — Discharge Instructions (Signed)
Please follow closely with your primary care doctor to discuss increasing your blood pressure medication as soon as you are able, and return to the emergency department if you begin to have worsening chest pain, shortness of breath

## 2023-04-12 NOTE — ED Provider Notes (Signed)
  Physical Exam  BP (!) 178/71   Pulse 78   Temp 98 F (36.7 C) (Oral)   Resp 12   SpO2 100%   Physical Exam  Procedures  Procedures  ED Course / MDM    Medical Decision Making Amount and/or Complexity of Data Reviewed Labs: ordered. Radiology: ordered.  Risk Prescription drug management.   Patient care handed off from [facility] at shift change.  See prior note for more full details.  In short, 72 year old male presents emergency department with complaints of elevated blood pressure as well as feelings of nausea after receiving hemodialysis earlier today.  States he was having some epigastric pain

## 2023-04-12 NOTE — ED Triage Notes (Signed)
[facility] reports pt coming from home c/o elevated BP and nausea.  Pt had dialysis and came home feeling nauseated. Pt states nurse gave him 2 green pills instead of his normal one, not sure what the green pills are.

## 2023-04-12 NOTE — ED Notes (Signed)
ED Provider at bedside. 

## 2023-04-12 NOTE — ED Notes (Signed)
Pt returned back to room from imaging.

## 2023-04-12 NOTE — ED Provider Notes (Signed)
[place] EMERGENCY DEPARTMENT AT [facility] Provider Note   CSN: [id number] Arrival date & time: 04/12/23  1843     History  Chief Complaint  Patient presents with   Hypertension    Shawn Collins is a 72 y.o. male.  Patient presents to the ED today after going through dialysis earlier today.  He appears to have difficulty concentrating but is able to follow instructions and give a coherent story.  EMS picked him up stating that he had high blood pressure, nausea and vomiting.  Patient states that he is now experiencing mild epigastric pain radiating toward the left side of his chest Similar to previous MI.  He endorses continued nausea and vomiting.  Denies shortness of breath, fatigue.   Hypertension Associated symptoms include abdominal pain (Epigastric pain radiating into left-sided chest). Pertinent negatives include no headaches.       Home Medications Prior to Admission medications   Medication Sig Start Date End Date Taking? Authorizing Provider  amLODipine (NORVASC) 2.5 MG tablet Take 1 tablet (2.5 mg total) by mouth at bedtime. 07/07/22 03/31/23  Baron Hamper, MD  aspirin EC 81 MG tablet Take 1 tablet (81 mg total) by mouth daily. Swallow whole. 07/07/22   Baron Hamper, MD  atorvastatin (LIPITOR) 40 MG tablet Take 1 tablet (40 mg total) by mouth daily. 07/07/22   Baron Hamper, MD  clopidogrel (PLAVIX) 75 MG tablet Take 1 tablet (75 mg total) by mouth daily. 12/29/21   de Saintclair Halsted, Cortney E, NP  doxercalciferol (HECTOROL) 4 MCG/2ML injection Inject 2 mLs (4 mcg total) into the vein every Monday, Wednesday, and Friday with hemodialysis. 12/28/21   de Saintclair Halsted, Cortney E, NP  furosemide (LASIX) 40 MG tablet Take 40 mg by mouth daily. Patient not taking: Reported on 03/31/2023    [provider]  hydrALAZINE (APRESOLINE) 25 MG tablet Take 1 tablet (25 mg total) by mouth 3 (three) times daily. Patient not taking: Reported on 03/31/2023 07/07/22 08/06/22  Baron Hamper, MD  losartan (COZAAR) 25 MG tablet Take 1 tablet (25 mg total) by mouth daily. Patient not taking: Reported on 03/31/2023 07/07/22 08/06/22  Baron Hamper, MD  metoprolol succinate (TOPROL-XL) 25 MG 24 hr tablet Take 3 tablets (75 mg total) by mouth daily. Patient not taking: Reported on 03/31/2023 07/08/22 08/07/22  Baron Hamper, MD  nitroGLYCERIN (NITROSTAT) 0.4 MG SL tablet Place 0.4 mg under the tongue every 5 (five) minutes as needed for chest pain.    [provider]  VELPHORO 500 MG chewable tablet Chew 500 mg by mouth 3 (three) times daily. Patient not taking: Reported on 03/31/2023 06/04/21   [provider]      Allergies    Patient has no known allergies.    Review of Systems   Review of Systems  Constitutional:  Negative for fatigue and fever.  Gastrointestinal:  Positive for abdominal pain (Epigastric pain radiating into left-sided chest), nausea and vomiting. Negative for constipation and diarrhea.  Genitourinary:  Negative for dysuria, enuresis and flank pain.  Skin:  Negative for color change and pallor.  Neurological:  Negative for dizziness, seizures, syncope, numbness and headaches.  Psychiatric/Behavioral:  Positive for decreased concentration.     Physical Exam Updated Vital Signs BP (!) 228/83   Pulse 73   Temp 98.1 F (36.7 C) (Oral)   Resp 11   SpO2 100%  Physical Exam Constitutional:      Appearance: Normal appearance.  Eyes:     General:  No scleral icterus.    Conjunctiva/sclera: Conjunctivae normal.  Cardiovascular:     Rate and Rhythm: Normal rate and regular rhythm.  Pulmonary:     Effort: Pulmonary effort is normal.     Breath sounds: Normal breath sounds.  Abdominal:     General: Abdomen is flat. There is no distension.     Palpations: Abdomen is soft. There is no mass.     Tenderness: There is no abdominal tenderness. There is no guarding or rebound.  Skin:    General: Skin is warm and dry.  Neurological:     General: No  focal deficit present.     Mental Status: He is alert and oriented to person, place, and time.     ED Results / Procedures / Treatments   Labs (all labs ordered are listed, but only abnormal results are displayed) Labs Reviewed  COMPREHENSIVE METABOLIC PANEL  CBC WITH DIFFERENTIAL/PLATELET  LIPASE, BLOOD  URINALYSIS, ROUTINE W REFLEX MICROSCOPIC  TROPONIN I (HIGH SENSITIVITY)    EKG None  Radiology No results found.  Procedures Procedures    Medications Ordered in ED Medications - No data to display  ED Course/ Medical Decision Making/ A&P                                  Medical Decision Making Amount and/or Complexity of Data Reviewed Labs: ordered. Radiology: ordered.     Patient presents to the ED for concern of nausea, vomiting, Epigastric pain, chest pain, this involves an extensive number of treatment options, and is a complaint that carries with it a high risk of complications and morbidity.  The differential diagnosis includes ACS, gastritis, pancreatitis, cholecystitis, esophageal disease   Co morbidities that complicate the patient evaluation  CKD COPD Previous NSTEMI Previous CABG Previous stroke Type 2 diabetes   Additional history obtained:  Additional history obtained from  EMS, Nursing, Outside Medical Records, and Past Admission   External records from outside source obtained and reviewed including previous admissions.    Lab Tests:  I Ordered, and personally interpreted labs.  The pertinent results include:   Troponin 39 with a repeat delta of Leukocytes trace/bacteria rare --do not suspect any pathology and is not experiencing any urinary symptoms.   Imaging Studies ordered:  I ordered imaging studies including CT head without contrast and chest x-ray I independently visualized and interpreted imaging which showed no abnormal pathology. I agree with the radiologist interpretation   Cardiac Monitoring:  The patient was  maintained on a cardiac monitor.  I personally viewed and interpreted the cardiac monitored which showed an underlying rhythm of: sinus rhythm   Medicines ordered and prescription drug management:  Patient was given 5 mg of amlodipine and 10 mg of hydralazine for high blood pressure. No nausea medications were ordered due to patient saying that his nausea had improved and did not need any further nausea medications. After blood pressure medication given patient improved to a blood pressure of 181/75.  I have reviewed the patients home medicines and have made adjustments as needed   Test Considered:  BNP not indicated as there were no concerns for worsening heart failure.   Critical Interventions:  Amlodipine and hydralazine for high blood pressure Patient improved upon getting medication.   Consultations Obtained:  I requested consultation with attending,  and discussed lab and imaging findings as well as pertinent plan - they recommend: Adding ACS workup due  to previous admissions and stating that current pain is similar to previous MI.   Problem List / ED Course:  Epigastric pain, nausea/vomiting --patient initially presented to the ED complaining of nausea/vomiting after dialysis.  After further questioning he states he is also experiencing mild epigastric pain radiating into the left side of his chest stating it feels similar to his previous MI.  He seems mildly confused in presenting his story.  CT head and chest x-ray were ordered due to ACS and altered mental status concerns. HTN -5 mg amlodipine and 10 mg of hydralazine given and patient responded well.  Patient should consult PCP to continue further management of blood pressure medications considering possibly raising his amlodipine.   Reevaluation:  After the interventions noted above, I reevaluated the patient and found that they have :improved   Social Determinants of Health:  On dialysis  11:57 PM Care of  @PATIENTNAME @ transferred to [facility] and Dr. Clayborne Dana at the end of my shift as the patient will require reassessment once labs/imaging have resulted. Patient presentation, ED course, and plan of care discussed with review of all pertinent labs and imaging. Please see his/her note for further details regarding further ED course and disposition. Plan at time of handoff is 0000. This may be altered or completely changed at the discretion of the oncoming team pending results of further workup.   Final Clinical Impression(s) / ED Diagnoses Final diagnoses:  None    Rx / DC Orders ED Discharge Orders     None         Lunette Stands, PA-C 04/12/23 2358    Melene Plan, DO 04/13/23 1525

## 2023-04-13 LAB — TROPONIN I (HIGH SENSITIVITY): Troponin I (High Sensitivity): 42 ng/L — ABNORMAL HIGH (ref <18)

## 2023-04-13 NOTE — ED Notes (Signed)
Pt reports he will call for him a cab himself.

## 2023-04-13 NOTE — ED Notes (Signed)
Called pt's RP, niece, and informed her pt was getting d/c. She states she lives in [place] and is unable to come and get him.

## 2023-06-27 ENCOUNTER — Ambulatory Visit (INDEPENDENT_AMBULATORY_CARE_PROVIDER_SITE_OTHER): Payer: Medicare Other | Admitting: Podiatry

## 2023-06-27 DIAGNOSIS — Z91199 Patient's noncompliance with other medical treatment and regimen due to unspecified reason: Secondary | ICD-10-CM

## 2023-06-27 NOTE — Progress Notes (Signed)
No show x 2.

## 2023-07-10 ENCOUNTER — Inpatient Hospital Stay (HOSPITAL_COMMUNITY)
Admission: EM | Admit: 2023-07-10 | Discharge: 2023-07-20 | DRG: 177 | Disposition: A | Discharge status: Skilled Nursing Facility | Payer: Medicare Other | Attending: Internal Medicine | Admitting: Internal Medicine

## 2023-07-10 ENCOUNTER — Other Ambulatory Visit: Payer: Self-pay

## 2023-07-10 ENCOUNTER — Emergency Department (HOSPITAL_COMMUNITY): Payer: Medicare Other

## 2023-07-10 ENCOUNTER — Encounter (HOSPITAL_COMMUNITY): Payer: Self-pay | Admitting: Internal Medicine

## 2023-07-10 DIAGNOSIS — E11649 Type 2 diabetes mellitus with hypoglycemia without coma: Secondary | ICD-10-CM | POA: Diagnosis present

## 2023-07-10 DIAGNOSIS — Z992 Dependence on renal dialysis: Secondary | ICD-10-CM

## 2023-07-10 DIAGNOSIS — D638 Anemia in other chronic diseases classified elsewhere: Secondary | ICD-10-CM | POA: Diagnosis present

## 2023-07-10 DIAGNOSIS — E162 Hypoglycemia, unspecified: Secondary | ICD-10-CM | POA: Diagnosis not present

## 2023-07-10 DIAGNOSIS — Z833 Family history of diabetes mellitus: Secondary | ICD-10-CM

## 2023-07-10 DIAGNOSIS — Z681 Body mass index (BMI) 19 or less, adult: Secondary | ICD-10-CM | POA: Diagnosis not present

## 2023-07-10 DIAGNOSIS — E1122 Type 2 diabetes mellitus with diabetic chronic kidney disease: Secondary | ICD-10-CM | POA: Diagnosis present

## 2023-07-10 DIAGNOSIS — E43 Unspecified severe protein-calorie malnutrition: Secondary | ICD-10-CM | POA: Diagnosis present

## 2023-07-10 DIAGNOSIS — N451 Epididymitis: Secondary | ICD-10-CM | POA: Diagnosis present

## 2023-07-10 DIAGNOSIS — E872 Acidosis, unspecified: Secondary | ICD-10-CM | POA: Diagnosis present

## 2023-07-10 DIAGNOSIS — G9341 Metabolic encephalopathy: Secondary | ICD-10-CM | POA: Diagnosis not present

## 2023-07-10 DIAGNOSIS — Z79899 Other long term (current) drug therapy: Secondary | ICD-10-CM

## 2023-07-10 DIAGNOSIS — D631 Anemia in chronic kidney disease: Secondary | ICD-10-CM | POA: Diagnosis present

## 2023-07-10 DIAGNOSIS — N179 Acute kidney failure, unspecified: Secondary | ICD-10-CM | POA: Diagnosis present

## 2023-07-10 DIAGNOSIS — U071 COVID-19: Secondary | ICD-10-CM | POA: Diagnosis present

## 2023-07-10 DIAGNOSIS — E871 Hypo-osmolality and hyponatremia: Secondary | ICD-10-CM | POA: Diagnosis present

## 2023-07-10 DIAGNOSIS — I252 Old myocardial infarction: Secondary | ICD-10-CM

## 2023-07-10 DIAGNOSIS — N2581 Secondary hyperparathyroidism of renal origin: Secondary | ICD-10-CM | POA: Diagnosis present

## 2023-07-10 DIAGNOSIS — Z951 Presence of aortocoronary bypass graft: Secondary | ICD-10-CM | POA: Diagnosis not present

## 2023-07-10 DIAGNOSIS — Z8249 Family history of ischemic heart disease and other diseases of the circulatory system: Secondary | ICD-10-CM

## 2023-07-10 DIAGNOSIS — N186 End stage renal disease: Secondary | ICD-10-CM | POA: Diagnosis present

## 2023-07-10 DIAGNOSIS — G928 Other toxic encephalopathy: Secondary | ICD-10-CM | POA: Diagnosis present

## 2023-07-10 DIAGNOSIS — R531 Weakness: Secondary | ICD-10-CM | POA: Diagnosis not present

## 2023-07-10 DIAGNOSIS — I160 Hypertensive urgency: Secondary | ICD-10-CM | POA: Diagnosis present

## 2023-07-10 DIAGNOSIS — Z8673 Personal history of transient ischemic attack (TIA), and cerebral infarction without residual deficits: Secondary | ICD-10-CM

## 2023-07-10 DIAGNOSIS — I2510 Atherosclerotic heart disease of native coronary artery without angina pectoris: Secondary | ICD-10-CM | POA: Diagnosis present

## 2023-07-10 DIAGNOSIS — Z87891 Personal history of nicotine dependence: Secondary | ICD-10-CM | POA: Diagnosis not present

## 2023-07-10 DIAGNOSIS — T7401XA Adult neglect or abandonment, confirmed, initial encounter: Secondary | ICD-10-CM

## 2023-07-10 DIAGNOSIS — Z91158 Patient's noncompliance with renal dialysis for other reason: Secondary | ICD-10-CM

## 2023-07-10 DIAGNOSIS — E785 Hyperlipidemia, unspecified: Secondary | ICD-10-CM | POA: Diagnosis present

## 2023-07-10 DIAGNOSIS — I120 Hypertensive chronic kidney disease with stage 5 chronic kidney disease or end stage renal disease: Secondary | ICD-10-CM | POA: Diagnosis present

## 2023-07-10 DIAGNOSIS — Z751 Person awaiting admission to adequate facility elsewhere: Secondary | ICD-10-CM

## 2023-07-10 DIAGNOSIS — E875 Hyperkalemia: Secondary | ICD-10-CM | POA: Diagnosis present

## 2023-07-10 DIAGNOSIS — J449 Chronic obstructive pulmonary disease, unspecified: Secondary | ICD-10-CM | POA: Diagnosis present

## 2023-07-10 DIAGNOSIS — N432 Other hydrocele: Secondary | ICD-10-CM | POA: Diagnosis present

## 2023-07-10 DIAGNOSIS — Z823 Family history of stroke: Secondary | ICD-10-CM

## 2023-07-10 DIAGNOSIS — Z7902 Long term (current) use of antithrombotics/antiplatelets: Secondary | ICD-10-CM

## 2023-07-10 LAB — CBC WITH DIFFERENTIAL/PLATELET
Abs Immature Granulocytes: 0.02 10*3/uL (ref 0.00–0.07)
Basophils Absolute: 0 10*3/uL (ref 0.0–0.1)
Basophils Relative: 1 %
Eosinophils Absolute: 0 10*3/uL (ref 0.0–0.5)
Eosinophils Relative: 0 %
HCT: 37.1 % — ABNORMAL LOW (ref 39.0–52.0)
Hemoglobin: 12.2 g/dL — ABNORMAL LOW (ref 13.0–17.0)
Immature Granulocytes: 0 %
Lymphocytes Relative: 8 %
Lymphs Abs: 0.4 10*3/uL — ABNORMAL LOW (ref 0.7–4.0)
MCH: 31 pg (ref 26.0–34.0)
MCHC: 32.9 g/dL (ref 30.0–36.0)
MCV: 94.2 fL (ref 80.0–100.0)
Monocytes Absolute: 0.5 10*3/uL (ref 0.1–1.0)
Monocytes Relative: 10 %
Neutro Abs: 3.9 10*3/uL (ref 1.7–7.7)
Neutrophils Relative %: 81 %
Platelets: 166 10*3/uL (ref 150–400)
RBC: 3.94 MIL/uL — ABNORMAL LOW (ref 4.22–5.81)
RDW: 14.7 % (ref 11.5–15.5)
WBC: 4.8 10*3/uL (ref 4.0–10.5)
nRBC: 0 % (ref 0.0–0.2)

## 2023-07-10 LAB — LIPID PANEL
Cholesterol: 146 mg/dL (ref 0–200)
HDL: 41 mg/dL (ref >40)
LDL Cholesterol: 81 mg/dL (ref 0–99)
Total CHOL/HDL Ratio: 3.6 {ratio}
Triglycerides: 122 mg/dL (ref <150)
VLDL: 24 mg/dL (ref 0–40)

## 2023-07-10 LAB — COMPREHENSIVE METABOLIC PANEL
ALT: 77 U/L — ABNORMAL HIGH (ref 0–44)
AST: 89 U/L — ABNORMAL HIGH (ref 15–41)
Albumin: 3.6 g/dL (ref 3.5–5.0)
Alkaline Phosphatase: 90 U/L (ref 38–126)
Anion gap: 29 — ABNORMAL HIGH (ref 5–15)
BUN: 162 mg/dL — ABNORMAL HIGH (ref 8–23)
CO2: 15 mmol/L — ABNORMAL LOW (ref 22–32)
Calcium: 8.1 mg/dL — ABNORMAL LOW (ref 8.9–10.3)
Chloride: 94 mmol/L — ABNORMAL LOW (ref 98–111)
Creatinine, Ser: 18.2 mg/dL — ABNORMAL HIGH (ref 0.61–1.24)
GFR, Estimated: 2 mL/min — ABNORMAL LOW (ref >60)
Glucose, Bld: 85 mg/dL (ref 70–99)
Potassium: 5.7 mmol/L — ABNORMAL HIGH (ref 3.5–5.1)
Sodium: 138 mmol/L (ref 135–145)
Total Bilirubin: 0.8 mg/dL (ref 0.0–1.2)
Total Protein: 7.2 g/dL (ref 6.5–8.1)

## 2023-07-10 LAB — CBG MONITORING, ED
Glucose-Capillary: 70 mg/dL (ref 70–99)
Glucose-Capillary: 189 mg/dL — ABNORMAL HIGH (ref 70–99)

## 2023-07-10 LAB — BRAIN NATRIURETIC PEPTIDE: B Natriuretic Peptide: >4500 pg/mL — ABNORMAL HIGH (ref 0.0–100.0)

## 2023-07-10 LAB — RESP PANEL BY RT-PCR (RSV, FLU A&B, COVID)  RVPGX2
Influenza A by PCR: NEGATIVE
Influenza B by PCR: NEGATIVE
Resp Syncytial Virus by PCR: NEGATIVE
SARS Coronavirus 2 by RT PCR: POSITIVE — AB

## 2023-07-10 LAB — I-STAT VENOUS BLOOD GAS, ED
Acid-base deficit: 8 mmol/L — ABNORMAL HIGH (ref 0.0–2.0)
Bicarbonate: 17.8 mmol/L — ABNORMAL LOW (ref 20.0–28.0)
Calcium, Ion: 0.87 mmol/L — CL (ref 1.15–1.40)
HCT: 38 % — ABNORMAL LOW (ref 39.0–52.0)
Hemoglobin: 12.9 g/dL — ABNORMAL LOW (ref 13.0–17.0)
O2 Saturation: 52 %
Potassium: 5.5 mmol/L — ABNORMAL HIGH (ref 3.5–5.1)
Sodium: 135 mmol/L (ref 135–145)
TCO2: 19 mmol/L — ABNORMAL LOW (ref 22–32)
pCO2, Ven: 35.9 mm[Hg] — ABNORMAL LOW (ref 44–60)
pH, Ven: 7.304 (ref 7.25–7.43)
pO2, Ven: 30 mm[Hg] — CL (ref 32–45)

## 2023-07-10 LAB — HEPATITIS B SURFACE ANTIGEN: Hepatitis B Surface Ag: NONREACTIVE

## 2023-07-10 LAB — PHOSPHORUS: Phosphorus: >30 mg/dL — ABNORMAL HIGH (ref 2.5–4.6)

## 2023-07-10 LAB — MAGNESIUM: Magnesium: 2.7 mg/dL — ABNORMAL HIGH (ref 1.7–2.4)

## 2023-07-10 LAB — GLUCOSE, CAPILLARY: Glucose-Capillary: 138 mg/dL — ABNORMAL HIGH (ref 70–99)

## 2023-07-10 LAB — I-STAT CG4 LACTIC ACID, ED: Lactic Acid, Venous: 1.4 mmol/L (ref 0.5–1.9)

## 2023-07-10 MED ORDER — HYDRALAZINE HCL 20 MG/ML IJ SOLN
10.0000 mg | INTRAMUSCULAR | Status: DC | PRN
  Administered 2023-07-10 – 2023-07-11 (×2): 10 mg via INTRAVENOUS
  Filled 2023-07-10 (×2): qty 1

## 2023-07-10 MED ORDER — ACETAMINOPHEN 650 MG RE SUPP: 650.0000 mg | Freq: Four times a day (QID) | RECTAL | Status: DC | PRN

## 2023-07-10 MED ORDER — HEPARIN SODIUM (PORCINE) 1000 UNIT/ML DIALYSIS: 1000.0000 [IU] | INTRAMUSCULAR | Status: DC | PRN

## 2023-07-10 MED ORDER — HEPARIN SODIUM (PORCINE) 5000 UNIT/ML IJ SOLN
5000.0000 [IU] | Freq: Three times a day (TID) | INTRAMUSCULAR | Status: DC
  Administered 2023-07-10 – 2023-07-19 (×27): 5000 [IU] via SUBCUTANEOUS
  Filled 2023-07-10 (×27): qty 1

## 2023-07-10 MED ORDER — PENTAFLUOROPROP-TETRAFLUOROETH EX AERO: 1.0000 | INHALATION_SPRAY | CUTANEOUS | Status: DC | PRN

## 2023-07-10 MED ORDER — LOSARTAN POTASSIUM 50 MG PO TABS
25.0000 mg | ORAL_TABLET | Freq: Every day | ORAL | Status: DC
  Administered 2023-07-10: 25 mg via ORAL
  Filled 2023-07-10: qty 1

## 2023-07-10 MED ORDER — AMLODIPINE BESYLATE 5 MG PO TABS
2.5000 mg | ORAL_TABLET | Freq: Every day | ORAL | Status: DC
Start: 2023-07-10 — End: 2023-07-10

## 2023-07-10 MED ORDER — CHLORHEXIDINE GLUCONATE CLOTH 2 % EX PADS
6.0000 | MEDICATED_PAD | Freq: Every day | CUTANEOUS | Status: DC
  Administered 2023-07-11 – 2023-07-19 (×9): 6 via TOPICAL

## 2023-07-10 MED ORDER — DEXTROSE 50 % IV SOLN
1.0000 | INTRAVENOUS | Status: DC | PRN
  Administered 2023-07-11: 50 mL via INTRAVENOUS
  Filled 2023-07-10: qty 50

## 2023-07-10 MED ORDER — LIDOCAINE HCL (PF) 1 % IJ SOLN: 5.0000 mL | INTRAMUSCULAR | Status: DC | PRN

## 2023-07-10 MED ORDER — ALBUTEROL SULFATE (2.5 MG/3ML) 0.083% IN NEBU: 2.5000 mg | INHALATION_SOLUTION | RESPIRATORY_TRACT | Status: DC | PRN

## 2023-07-10 MED ORDER — HEPARIN SODIUM (PORCINE) 1000 UNIT/ML IJ SOLN
5000.0000 [IU] | Freq: Once | INTRAMUSCULAR | Status: AC
  Administered 2023-07-11: 5000 [IU] via INTRAVENOUS
  Filled 2023-07-10 (×2): qty 5

## 2023-07-10 MED ORDER — HYDRALAZINE HCL 25 MG PO TABS
25.0000 mg | ORAL_TABLET | Freq: Three times a day (TID) | ORAL | Status: DC
  Administered 2023-07-11 (×2): 25 mg via ORAL
  Filled 2023-07-10 (×2): qty 1

## 2023-07-10 MED ORDER — AMLODIPINE BESYLATE 5 MG PO TABS: 2.5000 mg | ORAL_TABLET | Freq: Every day | ORAL | Status: DC

## 2023-07-10 MED ORDER — DEXTROSE 50 % IV SOLN
INTRAVENOUS | Status: AC
  Administered 2023-07-10: 50 mL
  Filled 2023-07-10: qty 50

## 2023-07-10 MED ORDER — CALCIUM GLUCONATE-NACL 1-0.675 GM/50ML-% IV SOLN
1.0000 g | Freq: Once | INTRAVENOUS | Status: AC
  Administered 2023-07-10: 1000 mg via INTRAVENOUS
  Filled 2023-07-10: qty 50

## 2023-07-10 MED ORDER — ACETAMINOPHEN 325 MG PO TABS
650.0000 mg | ORAL_TABLET | Freq: Four times a day (QID) | ORAL | Status: DC | PRN
  Administered 2023-07-11 – 2023-07-12 (×4): 650 mg via ORAL
  Filled 2023-07-10 (×4): qty 2

## 2023-07-10 MED ORDER — LIDOCAINE-PRILOCAINE 2.5-2.5 % EX CREA: 1.0000 | TOPICAL_CREAM | CUTANEOUS | Status: DC | PRN

## 2023-07-10 MED ORDER — GUAIFENESIN ER 600 MG PO TB12
600.0000 mg | ORAL_TABLET | Freq: Two times a day (BID) | ORAL | Status: DC
  Administered 2023-07-11 – 2023-07-20 (×19): 600 mg via ORAL
  Filled 2023-07-10 (×19): qty 1

## 2023-07-10 MED ORDER — ALTEPLASE 2 MG IJ SOLR: 2.0000 mg | Freq: Once | INTRAMUSCULAR | Status: DC | PRN

## 2023-07-10 NOTE — Consult Note (Addendum)
 [facility] Renal Consultation Note    Indication for Consultation:  Management of ESRD/hemodialysis; anemia, hypertension/volume and secondary hyperparathyroidism  ZOX:WRUEAVW, Shawn Llano, MD  HPI: Shawn Collins is a 73 y.o. male with ESRD on HD MWF at [facility]. His past medical history is significant for CAD, NSTEMI, history of CVA, hypertension, type 2 diabetes, hyperlipidemia, and COPD. Patient presented to the ED c/o fatigue, weakness, and missed hemodialysis for over a week.  Discussed with our Renal Navigator. It was noted of patient of being brought in today after a wellness check by local law enforcement. Shawn Collins is well known at our outpatient clinic. There has been ongoing concerns from HD staff on whether patient is able to take care of himself independently at home. Patient with longstanding hypertension which has also been an ongoing issue at his outpatient hemodialysis center. There is concern of patient not taking his blood pressure medications as prescribed and question whether he has underline dementia which has not been confirmed.   Reviewed his outside medical records. It appears his blood pressure regimen was adjusted by Cardiology last year due to a noted syncopal episode after hemodialysis. Per his dc summary from Feb 2024, he should be on Toprol  75mg  daily, Losartan  25mg  daily, Amlodipine  2.5mg  daily, and Hydralazine  25mg  TID. Seen and examined patient at bedside. He is oriented to himself and location but not to the year/month. He reports not feeling well; however, he denies SOB, CP, palpitations, and vomiting. Last HD was on 06/30/23 and is not reaching his EDW. Patient does miss treatments in outpatient as well. He has a functional L AVG. SBP is very high in the 200s. CXR shows no active disease and recent K+ level is reassuring. Plan for HD this evening or tonight and also to resume his home Bp medications. I think at this point, patient would  benefit going to a SNF. Will have to see if patient is amendable to this. Discussed dispo with the ED MD.  Past Medical History:  Diagnosis Date   CAD (coronary artery disease), native coronary artery    s/p NSTEMI with cath showing severe 3VD s/p  CABG x 3 with LIMA to LAD, SVG to OM1/OM2 and SVG D1 by Dr. Nicanor Barge on 10/26/16.   COPD (chronic obstructive pulmonary disease) (HCC) 09/10/2014   CVA (cerebral vascular accident) (HCC) 12/18/2021   Diabetes mellitus without complication (HCC)    Type II   ESRD (end stage renal disease) (HCC)    Hyperlipidemia LDL goal <70 04/17/2017   Hypertension    Past Surgical History:  Procedure Laterality Date   AV FISTULA PLACEMENT Left 12/21/2021   Procedure: INSERTION OF LEFT ARM ARTERIOVENOUS (AV) GORE-TEX GRAFT;  Surgeon: Dannis Dy, MD;  Location: [facility] OR;  Service: Vascular;  Laterality: Left;   CORONARY ARTERY BYPASS GRAFT N/A 10/26/2016   Procedure: CORONARY ARTERY BYPASS GRAFTING (CABG) x 4 USING LEFT INTERNAL MAMMARY ARTERY TO LAD AND ENDOSCOPICALLY HARVESTED GREATER SAPHENOUS VEIN TO OM 1, 2 AND TO DIAG;  Surgeon: Norita Beauvais, MD;  Location: [facility] OR;  Service: Open Heart Surgery;  Laterality: N/A;   ENDOVEIN HARVEST OF GREATER SAPHENOUS VEIN Right 10/26/2016   Procedure: ENDOVEIN HARVEST OF GREATER SAPHENOUS VEIN;  Surgeon: Norita Beauvais, MD;  Location: [facility] OR;  Service: Open Heart Surgery;  Laterality: Right;   IR THORACENTESIS ASP PLEURAL SPACE W/IMG GUIDE  11/03/2016   LEFT HEART CATH AND CORONARY ANGIOGRAPHY N/A 10/21/2016   Procedure: Left Heart Cath and  Coronary Angiography;  Surgeon: Swaziland, Peter M, MD;  Location: [facility] INVASIVE CV LAB;  Service: Cardiovascular;  Laterality: N/A;   TEE WITHOUT CARDIOVERSION N/A 10/26/2016   Procedure: TRANSESOPHAGEAL ECHOCARDIOGRAM (TEE);  Surgeon: Norita Beauvais, MD;  Location: [facility] OR;  Service: Open Heart Surgery;  Laterality: N/A;   Family History  Problem Relation Age of Onset    Diabetes Mellitus II Mother    Stroke Father    Hypertension Father    Diabetes Mellitus II Sister    Social History:  reports that he quit smoking about 53 years ago. His smoking use included cigarettes. He started smoking about 61 years ago. He has a 2 pack-year smoking history. He has never used smokeless tobacco. He reports that he does not currently use alcohol. He reports that he does not currently use drugs. No Known Allergies Prior to Admission medications   Medication Sig Start Date End Date Taking? Authorizing Provider  amLODipine  (NORVASC ) 2.5 MG tablet Take 1 tablet (2.5 mg total) by mouth at bedtime. 07/07/22 04/12/23  Sira, Zackery, MD  atorvastatin  (LIPITOR ) 40 MG tablet Take 1 tablet (40 mg total) by mouth daily. 07/07/22   Sira, Zackery, MD  clopidogrel  (PLAVIX ) 75 MG tablet Take 1 tablet (75 mg total) by mouth daily. 12/29/21   de Thayne Fine, Cortney E, NP  nitroGLYCERIN  (NITROSTAT ) 0.4 MG SL tablet Place 0.4 mg under the tongue every 5 (five) minutes as needed for chest pain.    [provider]   Current Facility-Administered Medications  Medication Dose Route Frequency Provider Last Rate Last Admin   [START ON 07/11/2023] Chlorhexidine  Gluconate Cloth 2 % PADS 6 each  6 each Topical Q0600 Kitty Perkins, NP       Current Outpatient Medications  Medication Sig Dispense Refill   amLODipine  (NORVASC ) 2.5 MG tablet Take 1 tablet (2.5 mg total) by mouth at bedtime. 30 tablet 0   atorvastatin  (LIPITOR ) 40 MG tablet Take 1 tablet (40 mg total) by mouth daily. 30 tablet 0   clopidogrel  (PLAVIX ) 75 MG tablet Take 1 tablet (75 mg total) by mouth daily. 30 tablet 1   nitroGLYCERIN  (NITROSTAT ) 0.4 MG SL tablet Place 0.4 mg under the tongue every 5 (five) minutes as needed for chest pain.     Labs: Basic Metabolic Panel: Recent Labs  Lab 07/10/23 1302 07/10/23 1304  NA 138 135  K 5.7* 5.5*  CL 94*  --   CO2 15*  --   GLUCOSE 85  --   BUN 162*  --   CREATININE 18.20*   --   CALCIUM  8.1*  --   PHOS >30.0*  --    Liver Function Tests: Recent Labs  Lab 07/10/23 1302  AST 89*  ALT 77*  ALKPHOS 90  BILITOT 0.8  PROT 7.2  ALBUMIN  3.6   No results for input(s): "LIPASE", "AMYLASE" in the last 168 hours. No results for input(s): "AMMONIA" in the last 168 hours. CBC: Recent Labs  Lab 07/10/23 1302 07/10/23 1304  WBC 4.8  --   NEUTROABS 3.9  --   HGB 12.2* 12.9*  HCT 37.1* 38.0*  MCV 94.2  --   PLT 166  --    Cardiac Enzymes: No results for input(s): "CKTOTAL", "CKMB", "CKMBINDEX", "TROPONINI" in the last 168 hours. CBG: Recent Labs  Lab 07/10/23 1244 07/10/23 1353  GLUCAP 70 189*   Iron Studies: No results for input(s): "IRON", "TIBC", "TRANSFERRIN", "FERRITIN" in the last 72 hours. Studies/Results: DG Chest Portable 1  View Result Date: 07/10/2023 CLINICAL DATA:  Edema.  End-stage renal disease.  Weakness. EXAM: PORTABLE CHEST 1 VIEW COMPARISON:  04/12/2023 FINDINGS: Bilateral lung fields are clear. No pulmonary edema. Bilateral costophrenic angles are clear. Note is made of elevated right hemidiaphragm. Stable cardio-mediastinal silhouette. There are surgical staples along the heart border and sternotomy wires, status post CABG (coronary artery bypass graft). No acute osseous abnormalities. The soft tissues are within normal limits. IMPRESSION: No active disease. Electronically Signed   By: Beula Brunswick M.D.   On: 07/10/2023 15:34    ROS: All others negative except those listed in HPI.  Physical Exam: Vitals:   07/10/23 1256 07/10/23 1400 07/10/23 1515 07/10/23 1530  BP: (!) 229/143 (!) 203/116 (!) 208/118 (!) 204/118  Pulse: 77 70 64 65  Resp: 20 13 17 19   Temp: (!) 94 F (34.4 C)     TempSrc: Rectal     SpO2: 100% 100% 98% 100%     General: Elderly male; appear unkept; NAD Head: Sclera not icteric  Lungs: CTA bilaterally. No wheeze, rales or rhonchi. Breathing is unlabored. Heart: RRR. No murmur, rubs or gallops.  Abdomen:  soft and non-tender Lower extremities: no LE edema  Dialysis Access: L AVG (+) B/T  Dialysis Orders:  MWF- [facility] 3hrs78min, BFR 400, DFR 800,  EDW 57kg, 2K/ 2.5Ca Heparin  5000 unit bolus with HD Mircera 75 mcg q2wks - recently restarted Hectorol  4mcg IV qHD - last dose 06/30/23 Sensipar  30mg  with HD - last dose 06/30/23  Home meds Per DC summary from Feb 2024. Do not see a recent OV note: Toprol  XL 75mg  daily Losartan  25mg  daily Amlodipine  2.5mg  daily Hydralazine  25mg  TID Renvela  2 tabs with meals and 1 tab BID with snacks   Assessment/Plan: ESRD -  on HD MWF. Missed HD for over a week. Plan for HD evening/tonight. See below. Hypertension/volume  - Not overloaded but very hypertensive. There is concern that patient is unable to take care of himself independently and not taking his medications as prescribed. Plan to resume his home BP medications per dc summary from 2024. Will adjust dosing if indicated. Anemia of CKD - Hgb 12.9. No Fe/ESA is indicated at this time Secondary Hyperparathyroidism -  Phos > 30?Aaron Aas Will re-check level in AM and restart his binders here. Nutrition - Renal diet with fluid restriction Dispo - There is concern from his outpatient hemodialysis center of patient having difficulty taking care of himself. Also concern whether he's taking his medications as prescribed. I do think SNF placement would be of benefit for him if he meets the criteria. I discussed this with the ED MD. More likely patient will need to be admitted for BP control and SNF placement. Need to ensure patient is amendable to this though.  Jadene Maxwell, NP [facility] 07/10/2023, 3:44 PM

## 2023-07-10 NOTE — ED Triage Notes (Signed)
 Patient BIB [facility] from home for generalized weakness, fatigue, cough, anorexia, and decreased intake. Patient has missed last 3 dialysis treatments, hypoglycemic with EMS CBG 58, received 2 doses 15g oral gluocse since CBG remained unchanged after first dose. Patient is ambulatory and hypertensive at 230/160.
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
## 2023-07-10 NOTE — H&P (Signed)
 History and Physical    Patient: Shawn Collins WGN:[id number] DOB: Oct 22, 1950 DOA: 07/10/2023 DOS: the patient was seen and examined on 07/10/2023 PCP: Charle Congo, MD  Patient coming from: Home via EMS  Chief Complaint: Weakness and cough  HPI: Shawn Collins is a 73 y.o. male with medical history significant of hypertension, hyperlipidemia, CAD s/p CABG, CVA, COPD, and ESRD on HD presents with complaints of weakness and cough.  His dialysis center had called for welfare check after he had missed his last 3 hemodialysis sessions.  Patient normally lives alone and is able to care for himself.  He reports that he has has not been feeling well as the cause for him missing his last hemodialysis sessions.  Also notes decreased p.o. intake EMS found him to be disheveled with unkept home for which she was brought to the hospital for further evaluation.  His initial blood glucose was 58 received 15 g of oral glucose.  Subsequent glucose 57 for which patient received a second dose of 15 g of oral glucose prior to arrival..    In the emergency department patient was noted to be afebrile, blood pressures elevated up to 229/143.  Labs significant for WBC 4.8, hemoglobin 12.2, sodium 138, potassium 5.7, CO2 15, BUN 162, creatinine 18.2, anion gap 29, calcium  8.1, phosphorus greater than 30, magnesium  2.7, AST 89, ALT 77, BNP greater than 4500. Covid-19 screening was positive.  Chest x-ray showed no active disease.  Review of Systems: As mentioned in the history of present illness. All other systems reviewed and are negative. Past Medical History:  Diagnosis Date   CAD (coronary artery disease), native coronary artery    s/p NSTEMI with cath showing severe 3VD s/p  CABG x 3 with LIMA to LAD, SVG to OM1/OM2 and SVG D1 by Dr. Nicanor Barge on 10/26/16.   COPD (chronic obstructive pulmonary disease) (HCC) 09/10/2014   CVA (cerebral vascular accident) (HCC) 12/18/2021   Diabetes mellitus without complication (HCC)     Type II   ESRD (end stage renal disease) (HCC)    Hyperlipidemia LDL goal <70 04/17/2017   Hypertension    Past Surgical History:  Procedure Laterality Date   AV FISTULA PLACEMENT Left 12/21/2021   Procedure: INSERTION OF LEFT ARM ARTERIOVENOUS (AV) GORE-TEX GRAFT;  Surgeon: Dannis Dy, MD;  Location: [facility] OR;  Service: Vascular;  Laterality: Left;   CORONARY ARTERY BYPASS GRAFT N/A 10/26/2016   Procedure: CORONARY ARTERY BYPASS GRAFTING (CABG) x 4 USING LEFT INTERNAL MAMMARY ARTERY TO LAD AND ENDOSCOPICALLY HARVESTED GREATER SAPHENOUS VEIN TO OM 1, 2 AND TO DIAG;  Surgeon: Norita Beauvais, MD;  Location: [facility] OR;  Service: Open Heart Surgery;  Laterality: N/A;   ENDOVEIN HARVEST OF GREATER SAPHENOUS VEIN Right 10/26/2016   Procedure: ENDOVEIN HARVEST OF GREATER SAPHENOUS VEIN;  Surgeon: Norita Beauvais, MD;  Location: [facility] OR;  Service: Open Heart Surgery;  Laterality: Right;   IR THORACENTESIS ASP PLEURAL SPACE W/IMG GUIDE  11/03/2016   LEFT HEART CATH AND CORONARY ANGIOGRAPHY N/A 10/21/2016   Procedure: Left Heart Cath and Coronary Angiography;  Surgeon: Swaziland, Peter M, MD;  Location: [facility] INVASIVE CV LAB;  Service: Cardiovascular;  Laterality: N/A;   TEE WITHOUT CARDIOVERSION N/A 10/26/2016   Procedure: TRANSESOPHAGEAL ECHOCARDIOGRAM (TEE);  Surgeon: Norita Beauvais, MD;  Location: [facility] OR;  Service: Open Heart Surgery;  Laterality: N/A;   Social History:  reports that he quit smoking about 53 years ago. His smoking use included cigarettes. He started smoking  about 61 years ago. He has a 2 pack-year smoking history. He has never used smokeless tobacco. He reports that he does not currently use alcohol. He reports that he does not currently use drugs.  No Known Allergies  Family History  Problem Relation Age of Onset   Diabetes Mellitus II Mother    Stroke Father    Hypertension Father    Diabetes Mellitus II Sister     Prior to Admission medications   Medication Sig Start  Date End Date Taking? Authorizing Provider  amLODipine  (NORVASC ) 2.5 MG tablet Take 1 tablet (2.5 mg total) by mouth at bedtime. 07/07/22 04/12/23  Sira, Zackery, MD  atorvastatin  (LIPITOR ) 40 MG tablet Take 1 tablet (40 mg total) by mouth daily. 07/07/22   Sira, Zackery, MD  clopidogrel  (PLAVIX ) 75 MG tablet Take 1 tablet (75 mg total) by mouth daily. 12/29/21   de Thayne Fine, Cortney E, NP  nitroGLYCERIN  (NITROSTAT ) 0.4 MG SL tablet Place 0.4 mg under the tongue every 5 (five) minutes as needed for chest pain.    [provider]    Physical Exam: Vitals:   07/10/23 1247 07/10/23 1256 07/10/23 1400 07/10/23 1515  BP: (!) 229/124 (!) 229/143 (!) 203/116 (!) 208/118  Pulse: 84 77 70 64  Resp: 13 20 13 17   Temp:  (!) 94 F (34.4 C)    TempSrc:  Rectal    SpO2: 97% 100% 100% 98%  Exam  Constitutional: Elderly male who appears ill but in no acute distress Eyes: PERRL, lids and conjunctivae normal ENMT: Mucous membranes are moist.  Neck: normal, supple  Respiratory: clear to auscultation bilaterally, no wheezing, no crackles. Normal respiratory effort. No accessory muscle use.  Cardiovascular: Regular rate and rhythm, no murmurs / rubs / gallops. No extremity edema.  Left upper extremity fistula in place Abdomen: no tenderness, no masses palpated. Bowel sounds positive.  Musculoskeletal: no clubbing / cyanosis. No joint deformity upper and lower extremities. Good ROM, no contractures. Normal muscle tone.  Skin: no rashes, lesions, ulcers. No induration Neurologic: CN 2-12 grossly intact.  Generalized weakness but able to move all extremities.   Psychiatric: Patient is alert and oriented to self.  Seems to have some confusion at times  Data Reviewed:  EKG revealed normal sinus rhythm 83 bpm. Reviewed labs, imaging, and pertinent records as documented.  Assessment and Plan:  ESRD on HD Hyperkalemia Metabolic acidosis with increased anion gap Patient presents after missing at least a  week of hemodialysis.  Labs significant for potassium 5.7, CO2 15, BUN 162, creatinine 18.2, calcium  8.1, phosphorus greater than 30, magnesium  2.7,   anion gap 29, and BNP > 4500.  Chest x-ray showed no active disease.  He does not appear grossly fluid overloaded on physical exam.  She had been consulted for need of hemodialysis. -Admit to a telemetry bed -Renal diet with fluid restriction -Appreciate nephrology consultative services and orders for hemodialysis  COVID-19 infection Patient was found to be positive for COVID-19.  Patient is not a candidate for remdesivir. -Incentive spirometry and flutter valve -Mucinex  -Breathing treatments as needed for shortness of breath/wheezing  Hypertensive urgency/emergency On admission blood pressures noted to be elevated up to 229/143.  It was not clear how long patient had not been taking blood pressure medicines. -Restart hydralazine  25 mg 3 times daily, losartan  25 mg daily, amlodipine  2.5 mg  Metabolic encephalopathy Patient noted to have some confusion on  Hypoglycemia Resolved.  Initially glucose levels were noted to be 58  and subsequently 57 after initial oral glucose.  In the ED after second dose of oral glucose blood sugar was noted to be 85. -Hypoglycemic protocols -Amp of D50 as needed for CBGs less than 70  Anemia chronic disease Hemoglobin 12.2 which appears higher than previous. -Recheck CBC tomorrow morning  Weakness On physical exam patient has generalized weakness. -PT/OT to evaluate and treat  COPD Patient without acute exacerbation noted at this time. -Albuterol  treatments as needed  CAD Patient with prior history of CABG.  Previously on aspirin , Plavix , and statin. -Add on lipid panel -Consider resuming prior medications  DVT prophylaxis: Heparin  Advance Care Planning:   Code Status: Full Code   Consults: Nephrology Family Communication: Attempted to call patient's friend Ms. Timm Foot without answer and therefore  voicemail was left  Severity of Illness: The appropriate patient status for this patient is INPATIENT. Inpatient status is judged to be reasonable and necessary in order to provide the required intensity of service to ensure the patient's safety. The patient's presenting symptoms, physical exam findings, and initial radiographic and laboratory data in the context of their chronic comorbidities is felt to place them at high risk for further clinical deterioration. Furthermore, it is not anticipated that the patient will be medically stable for discharge from the hospital within 2 midnights of admission.   * I certify that at the point of admission it is my clinical judgment that the patient will require inpatient hospital care spanning beyond 2 midnights from the point of admission due to high intensity of service, high risk for further deterioration and high frequency of surveillance required.*  Author: Lena Qualia, MD 07/10/2023 3:26 PM  For on call review [email or web address].

## 2023-07-10 NOTE — Progress Notes (Signed)
 Contacted by pt's out-pt HD clinic Child psychotherapist. Advised that pt has been brought to the hospital after a wellness check by local law enforcement. Clinic staff have concerns if pt is able to care for self and live independently. Clinic staff feel pt may benefit from placement if pt meets criteria. Will provide this info to hospital staff. Pt receives out-pt HD at [facility] on MWF 10:25 am chair time. Will assist as needed.   Lauraine Polite Renal Navigator [phone]

## 2023-07-11 DIAGNOSIS — N186 End stage renal disease: Secondary | ICD-10-CM | POA: Diagnosis not present

## 2023-07-11 DIAGNOSIS — Z992 Dependence on renal dialysis: Secondary | ICD-10-CM | POA: Diagnosis not present

## 2023-07-11 LAB — HEPATITIS B SURFACE ANTIBODY, QUANTITATIVE: Hep B S AB Quant (Post): 127 m[IU]/mL

## 2023-07-11 LAB — RENAL FUNCTION PANEL
Albumin: 2.7 g/dL — ABNORMAL LOW (ref 3.5–5.0)
Anion gap: 18 — ABNORMAL HIGH (ref 5–15)
BUN: 87 mg/dL — ABNORMAL HIGH (ref 8–23)
CO2: 21 mmol/L — ABNORMAL LOW (ref 22–32)
Calcium: 7.7 mg/dL — ABNORMAL LOW (ref 8.9–10.3)
Chloride: 93 mmol/L — ABNORMAL LOW (ref 98–111)
Creatinine, Ser: 12.01 mg/dL — ABNORMAL HIGH (ref 0.61–1.24)
GFR, Estimated: 4 mL/min — ABNORMAL LOW (ref >60)
Glucose, Bld: 145 mg/dL — ABNORMAL HIGH (ref 70–99)
Phosphorus: 8 mg/dL — ABNORMAL HIGH (ref 2.5–4.6)
Potassium: 4 mmol/L (ref 3.5–5.1)
Sodium: 132 mmol/L — ABNORMAL LOW (ref 135–145)

## 2023-07-11 LAB — CBC
HCT: 32.2 % — ABNORMAL LOW (ref 39.0–52.0)
Hemoglobin: 11.1 g/dL — ABNORMAL LOW (ref 13.0–17.0)
MCH: 30.7 pg (ref 26.0–34.0)
MCHC: 34.5 g/dL (ref 30.0–36.0)
MCV: 89.2 fL (ref 80.0–100.0)
Platelets: 161 10*3/uL (ref 150–400)
RBC: 3.61 MIL/uL — ABNORMAL LOW (ref 4.22–5.81)
RDW: 14.6 % (ref 11.5–15.5)
WBC: 5.1 10*3/uL (ref 4.0–10.5)
nRBC: 0 % (ref 0.0–0.2)

## 2023-07-11 LAB — GLUCOSE, CAPILLARY
Glucose-Capillary: 57 mg/dL — ABNORMAL LOW (ref 70–99)
Glucose-Capillary: 133 mg/dL — ABNORMAL HIGH (ref 70–99)
Glucose-Capillary: 141 mg/dL — ABNORMAL HIGH (ref 70–99)
Glucose-Capillary: 166 mg/dL — ABNORMAL HIGH (ref 70–99)

## 2023-07-11 MED ORDER — AMLODIPINE BESYLATE 10 MG PO TABS
10.0000 mg | ORAL_TABLET | Freq: Every day | ORAL | Status: DC
  Administered 2023-07-11 – 2023-07-19 (×9): 10 mg via ORAL
  Filled 2023-07-11 (×9): qty 1

## 2023-07-11 MED ORDER — LOSARTAN POTASSIUM 50 MG PO TABS
100.0000 mg | ORAL_TABLET | Freq: Every day | ORAL | Status: DC
  Administered 2023-07-11 – 2023-07-20 (×9): 100 mg via ORAL
  Filled 2023-07-11 (×9): qty 2

## 2023-07-11 MED ORDER — HYDRALAZINE HCL 50 MG PO TABS
50.0000 mg | ORAL_TABLET | Freq: Three times a day (TID) | ORAL | Status: DC
  Administered 2023-07-11 – 2023-07-20 (×23): 50 mg via ORAL
  Filled 2023-07-11 (×24): qty 1

## 2023-07-11 NOTE — Progress Notes (Signed)
   07/11/23 0635  Vitals  Temp 98.8 F (37.1 C)  Pulse Rate 75  Resp 17  BP (!) 203/98  SpO2 100 %  O2 Device Room Air  Weight 54.5 kg  Type of Weight Post-Dialysis  Post Treatment  Dialyzer Clearance Lightly streaked  Hemodialysis Intake (mL) 0 mL  Liters Processed 84  Fluid Removed (mL) 2100 mL  Tolerated HD Treatment Yes  AVG/AVF Arterial Site Held (minutes) 15 minutes  AVG/AVF Venous Site Held (minutes) 25 minutes   Received patient in bed to unit.  Alert and oriented.  Informed consent signed and in chart.   TX duration:3.30  Patient tolerated well.  Transported back to the room  Alert, without acute distress.  Hand-off given to patient's nurse.   Access used: LLAG Access issues: bleeding of up to 25 minates when venous needles removed--even with the use of surgifoam  Total UF removed: 2100 Medication(s) given: hydralizine 10mg  iv given per primary rn mid tx---to notify primary md for bp tx   [facility]

## 2023-07-11 NOTE — Progress Notes (Signed)
Admit: 07/10/2023 LOS: 1  34M ESRD MWF presenting with weakness, uremia and confusion after missing dialysis for over 1 week, COVID-19 positive; hypertensive urgency  Subjective:  Dialysis overnight with 2.1 L UF Blood pressures remain elevated after initiation/starting amlodipine, hydralazine, losartan No direct therapies for COVID-19 He is oriented to self, location, year, month He says he did not go to dialysis because he was feeling weak and fatigued  02/10 0701 - 02/11 0700 In: 241.6 [P.O.:240; IV Piggyback:1.6] Out: 2300 [Urine:200]  Filed Weights   07/11/23 0152 07/11/23 0600 07/11/23 0635  Weight: 56.7 kg 54.5 kg 54.5 kg    Scheduled Meds:  amLODipine  10 mg Oral QHS   Chlorhexidine Gluconate Cloth  6 each Topical Q0600   guaiFENesin  600 mg Oral BID   heparin  5,000 Units Subcutaneous Q8H   hydrALAZINE  25 mg Oral Q8H   losartan  100 mg Oral Daily   Continuous Infusions: PRN Meds:.acetaminophen **OR** acetaminophen, albuterol, alteplase, dextrose, heparin, hydrALAZINE, lidocaine (PF), lidocaine-prilocaine, pentafluoroprop-tetrafluoroeth  Current Labs: reviewed    Physical Exam:  Blood pressure (!) 182/85, pulse 73, temperature 98.1 F (36.7 C), temperature source Oral, resp. rate 17, height 5\' 10"  (1.778 m), weight 54.5 kg, SpO2 100%. NAD, poor historian, disheveled/unkempt appearance Regular, normal S1 and S2 Clear bilaterally without wheezing or crackles Left upper arm AV graft with bruit and thrill No peripheral edema  Dialysis Orders:  M[facility], BFR 400, DFR 800,  EDW 57kg, 2K/ 2.5Ca Heparin 5000 unit bolus with HD Mircera 75 mcg q2wks - recently restarted Hectorol IV qHD - last dose 06/30/23 Sensipar 30mg  with HD - last dose 06/30/23  A Confusion/weakness: Likely uremia contributing, also COVID-19 Uremia after missing dialysis ESRD on hemodialysis MWF: Dialysis overnight 2/10.  Next treatment 2/12.  A.m. labs  pending.   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  Admission phosphorus greater than 30 likely artifact, trend.  If remains elevated can restart binders. Metabolic acidosis secondary to ESRD and missing dialysis Severe hypertension with nonadherence: 3 agents have been restarted, trend blood pressures over the course of today  P Next dialysis tomorrow, 2-3L UF Amlodipine and losartan increased to full dose PT/OT, disposition pending Medication Issues; Preferred narcotic agents for pain control are hydromorphone, fentanyl, and methadone. Morphine should not be used.  Baclofen should be avoided Avoid oral sodium phosphate and magnesium citrate based laxatives / bowel preps    Sabra Heck MD 07/11/2023, 9:58 AM  Recent Labs  Lab 07/10/23 1302 07/10/23 1304  NA 138 135  K 5.7* 5.5*  CL 94*  --   CO2 15*  --   GLUCOSE 85  --   BUN 162*  --   CREATININE 18.20*  --   CALCIUM 8.1*  --   PHOS >30.0*  --    Recent Labs  Lab 07/10/23 1302 07/10/23 1304  WBC 4.8  --   NEUTROABS 3.9  --   HGB 12.2* 12.9*  HCT 37.1* 38.0*  MCV 94.2  --   PLT 166  --

## 2023-07-11 NOTE — Plan of Care (Signed)
Problem: Education: Goal: Knowledge of risk factors and measures for prevention of condition will improve Outcome: Progressing   Problem: Coping: Goal: Psychosocial and spiritual needs will be supported Outcome: Progressing   Problem: Respiratory: Goal: Will maintain a patent airway Outcome: Progressing

## 2023-07-11 NOTE — Progress Notes (Signed)
Provided out-pt HD clinic concerns to RN CM and CSW. Will assist as needed.   Olivia Canter Renal Navigator [phone]

## 2023-07-11 NOTE — Evaluation (Addendum)
Occupational Therapy Evaluation Patient Details Name: Shawn Collins MRN: [id number] DOB: 1950/06/17 Today's Date: 07/11/2023   History of Present Illness   55M  presenting 2/10 with weakness, uremia and confusion after missing dialysis for over 1 week, found to be COVID-19 positive; hypertensive urgency. PMHx: hypertension, hyperlipidemia, CAD s/p CABG, CVA, COPD, ESRD on HD MWF.     Clinical Impressions Pt c/o of difficulty breathing/congestion in chest, no pain. Pt lives in group home, has friends around most of each day, states his PLOF independent without RW/cane, 4 STE. Pt currently at supervision/CGA level for ADLs/mobility, did require min A a couple of times to maintain balance with/without RW, cueing to use RW as Pt did grab it when trying to ambulate without verbal cues. Pt has instability with turns, but able to maintain balance with RW for support. Due to decreased safety awareness, unsure if Pt will consistently use RW when up, instructed to use call bell when needing to get OOB, bed alarm set. Pt would benefit from continued acute OT to maximize independence and return to PLOF, would benefit from [facility] for [facility]. No OT follow up expected if Pt able to return to group home with supervision. If Pt continues to have trouble with safety awareness and limited balance, and does not have supervision upon return home, may need postacute rehab.      If plan is discharge home, recommend the following:   A little help with walking and/or transfers;A little help with bathing/dressing/bathroom;Assistance with cooking/housework;Assist for transportation;Help with stairs or ramp for entrance     Functional Status Assessment   Patient has had a recent decline in their functional status and demonstrates the ability to make significant improvements in function in a reasonable and predictable amount of time.     Equipment Recommendations   BSC/3in1     Recommendations for Other  Services         Precautions/Restrictions   Precautions Precautions: Fall Recall of Precautions/Restrictions: Impaired Restrictions Weight Bearing Restrictions Per Provider Order: No     Mobility Bed Mobility Overal bed mobility: Modified Independent             General bed mobility comments: increased time    Transfers Overall transfer level: Needs assistance Equipment used: Rolling walker (2 wheels) Transfers: Sit to/from Stand Sit to Stand: Contact guard assist, Min assist           General transfer comment: CGA-min A to maintain balance, did have LOB without RW standing, cueing to use RW consistently, and also some LOB with RW as well.      Balance Overall balance assessment: Needs assistance Sitting-balance support: No upper extremity supported, Feet supported Sitting balance-Leahy Scale: Fair Sitting balance - Comments: EOB ADLs Postural control: Posterior lean Standing balance support: Bilateral upper extremity supported, During functional activity Standing balance-Leahy Scale: Poor Standing balance comment: LOB with static standing                           ADL either performed or assessed with clinical judgement   ADL Overall ADL's : Needs assistance/impaired                                       General ADL Comments: Pt set up/supervision for ADLs, CGA for ambulation with RW, LOB several times with/without RW     Vision Baseline Vision/History:  0 No visual deficits Ability to See in Adequate Light: 0 Adequate Patient Visual Report: No change from baseline       Perception         Praxis         Pertinent Vitals/Pain Pain Assessment Pain Assessment: No/denies pain     Extremity/Trunk Assessment Upper Extremity Assessment Upper Extremity Assessment: Overall WFL for tasks assessed   Lower Extremity Assessment Lower Extremity Assessment: Defer to PT evaluation       Communication     Cognition  Arousal: Lethargic Behavior During Therapy: WFL for tasks assessed/performed Cognition: No apparent impairments                                       Cueing  General Comments          Exercises     Shoulder Instructions      Home Living Family/patient expects to be discharged to:: Private residence Living Arrangements: Group Home Available Help at Discharge: Friend(s) Type of Home: House Home Access: Stairs to enter Secretary/administrator of Steps: 3-4 Entrance Stairs-Rails: Right;Left Home Layout: Able to live on main level with bedroom/bathroom     Bathroom Shower/Tub: Walk-in shower         Home Equipment: Agricultural consultant (2 wheels);Cane - single point          Prior Functioning/Environment Prior Level of Function : Independent/Modified Independent             Mobility Comments: Ambulates with SPC typically ADLs Comments: Only states that "they can help a little bit" referring to staff at group home.    OT Problem List: Decreased strength;Decreased activity tolerance;Impaired balance (sitting and/or standing);Decreased safety awareness   OT Treatment/Interventions: Self-care/ADL training;Therapeutic exercise;Therapeutic activities;Energy conservation;DME and/or AE instruction      OT Goals(Current goals can be found in the care plan section)   Acute Rehab OT Goals Patient Stated Goal: to improve breathing OT Goal Formulation: With patient Time For Goal Achievement: 07/25/23 Potential to Achieve Goals: Good   OT Frequency:  Min 1X/week    Co-evaluation              AM-PAC OT "6 Clicks" Daily Activity     Outcome Measure Help from another person eating meals?: None Help from another person taking care of personal grooming?: A Little Help from another person toileting, which includes using toliet, bedpan, or urinal?: A Little Help from another person bathing (including washing, rinsing, drying)?: A Little Help from another  person to put on and taking off regular upper body clothing?: A Little Help from another person to put on and taking off regular lower body clothing?: A Little 6 Click Score: 19   End of Session Equipment Utilized During Treatment: Gait belt;Rolling walker (2 wheels) Nurse Communication: Mobility status  Activity Tolerance: Patient tolerated treatment well Patient left: in bed;with call bell/phone within reach;with bed alarm set  OT Visit Diagnosis: Unsteadiness on feet (R26.81);Other abnormalities of gait and mobility (R26.89);Muscle weakness (generalized) (M62.81)                Time: 0981-1914 OT Time Calculation (min): 25 min Charges:  OT General Charges $OT Visit: 1 Visit OT Evaluation $OT Eval Low Complexity: 1 Low OT Treatments $Self Care/Home Management : 8-22 mins  [place], OTR/L   Alexis Goodell 07/11/2023, 2:16 PM

## 2023-07-11 NOTE — Progress Notes (Signed)
Progress Note   Patient: Shawn Collins JYN:[id number] DOB: [birth year] DOA: 07/10/2023     1 DOS: the patient was seen and examined on 07/11/2023   Brief hospital course: Shawn Collins is a 73 y.o. male with medical history significant of hypertension, hyperlipidemia, CAD s/p CABG, CVA, COPD, and ESRD on HD presents with complaints of weakness and cough.  His dialysis center had called for welfare check after he had missed his last 3 hemodialysis sessions.  Patient normally lives alone and is able to care for himself.  He reports that he has has not been feeling well as the cause for him missing his last hemodialysis sessions.  Also notes decreased p.o. intake EMS found him to be disheveled with unkept home for which she was brought to the hospital for further evaluation.  His initial blood glucose was 58 received 15 g of oral glucose.  Subsequent glucose 57 for which patient received a second dose of 15 g of oral glucose prior to arrival..       In the emergency department patient was noted to be afebrile, blood pressures elevated up to 229/143.  Labs significant for WBC 4.8, hemoglobin 12.2, sodium 138, potassium 5.7, CO2 15, BUN 162, creatinine 18.2, anion gap 29, calcium 8.1, phosphorus greater than 30, magnesium 2.7, AST 89, ALT 77, BNP greater than 4500. Covid-19 screening was positive.  Chest x-ray showed no active disease.  Assessment and Plan:  COVID 19 infection  Patient presented after his HD center called for a wellness check. Patient was noted to be disheveled and confused. Patient states he has had malaise for the past week causing him to miss his HD sessions. He has no dyspnea.  -Continue supportive care -IS/flutter valve -As needed breathing treatments  ESRD on HD Metabolic acidosis with increased anion gap Resolving  Patient has missed several HD sessions due to above. He has also missed HD sessions in the past. Patient had electrolyte abnormalities and severely elevated blood  pressure. Patient also appears volume overloaded on exam with elevated JVP and rales.  -Continue HD per nephrology  -May require phos binder  -CM/SW for placement   SW/CM team noted that his clinic SW has concerns about patient's living situation and his ability to care for himself. He has had 2 wellness checks this past month.     Toxic/Metabolic encephalopathy Resolved  In the setting of COVID infection and uremia. Resolved with HD.   Hypoglycemia Resolved   Hyperkalemia  Resolved  Anemia of chronic kidney disease     Latest Ref Rng & Units 07/11/2023    9:33 AM 07/10/2023    1:04 PM 07/10/2023    1:02 PM  CBC  WBC 4.0 - 10.5 K/uL 5.1   4.8   Hemoglobin 13.0 - 17.0 g/dL 86.5  78.4  69.6   Hematocrit 39.0 - 52.0 % 32.2  38.0  37.1   Platelets 150 - 400 K/uL 161   166   Stable  Down 1 point. No evidence of bleeding.  -Check iron panel -ESA per nephrology   CAD  Stable. S/p CABG Previously on aspirin plavix and statin.  Resume aspirin statin  COPD without acute exacerbation PRN albuterol      Subjective: No acute complaints. Mentation clearer.   Physical Exam: Vitals:   07/11/23 0600 07/11/23 0635 07/11/23 0840 07/11/23 1254  BP: (!) 210/95 (!) 203/98 (!) 182/85 (!) 182/91  Pulse: 73 75 73 68  Resp: 17 17    Temp: 98.8 F (37.1  C) 98.8 F (37.1 C) 98.1 F (36.7 C)   TempSrc:   Oral   SpO2: 100% 100% 100%   Weight: 54.5 kg 54.5 kg    Height:       Physical Exam  Constitutional: In no distress.  Cardiovascular: Normal rate, regular rhythm. Trace to 1+ lower extremity edema, elevated JVP  Pulmonary: Non labored breathing on room air, no wheezing or rales.   Abdominal: Soft. Normal bowel sounds. Non distended and non tender Musculoskeletal: Normal range of motion.     Neurological: Alert and oriented to person, place, and time. Non focal  Skin: Skin is warm and dry.   Data Reviewed:  I have reviewed labs and images     Latest Ref Rng & Units 07/11/2023     9:33 AM 07/10/2023    1:04 PM 07/10/2023    1:02 PM  BMP  Glucose 70 - 99 mg/dL 161   85   BUN 8 - 23 mg/dL 87   096   Creatinine 0.45 - 1.24 mg/dL 40.98   11.91   Sodium 135 - 145 mmol/L 132  135  138   Potassium 3.5 - 5.1 mmol/L 4.0  5.5  5.7   Chloride 98 - 111 mmol/L 93   94   CO2 22 - 32 mmol/L 21   15   Calcium 8.9 - 10.3 mg/dL 7.7   8.1       Latest Ref Rng & Units 07/11/2023    9:33 AM 07/10/2023    1:04 PM 07/10/2023    1:02 PM  CBC  WBC 4.0 - 10.5 K/uL 5.1   4.8   Hemoglobin 13.0 - 17.0 g/dL 47.8  29.5  62.1   Hematocrit 39.0 - 52.0 % 32.2  38.0  37.1   Platelets 150 - 400 K/uL 161   166      Family Communication: None  Disposition: Status is: Inpatient Remains inpatient appropriate because: continued need for HD treatments, safe dispotision   Planned Discharge Destination:  Pending clinical course     Time spent: 35 minutes  Author: Marolyn Haller, MD 07/11/2023 4:54 PM  For on call review [email or web address].

## 2023-07-11 NOTE — Progress Notes (Signed)
he DR Notified that pt has been twitching and has random jerking motions. Pt denies pain.

## 2023-07-11 NOTE — Evaluation (Signed)
Physical Therapy Evaluation Patient Details Name: Shawn Collins MRN: [id number] DOB: Apr 30, 1951 Today's Date: 07/11/2023  History of Present Illness  50M  presenting 2/10 with weakness, uremia and confusion after missing dialysis for over 1 week, found to be COVID-19 positive; hypertensive urgency. PMHx: hypertension, hyperlipidemia, CAD s/p CABG, CVA, COPD, ESRD on HD MWF.   Clinical Impression  Pt admitted with above diagnosis. Mostly limited by impaired cognition today which I believe is altering his safety with mobility and awareness. He was able to ambulate with RW 45 feet in room at a supervision level, demonstrates mild instability with turns. SpO2 98% on RA, denies SOB. Has several stairs to navigate at his group home. Acute rehab goals set to progress patient to home with HHPT follow-up. If he fails to improve rapidly (improved safety awareness and safe DME use with RW,) may need to consider some short term post acute rehab, unless group home is able to supervise pt with AMS. Hopefully this fully resolves prior to d/c. We will continue to follow and progress as tolerated during admission. Pt currently with functional limitations due to the deficits listed below (see PT Problem List). Pt will benefit from acute skilled PT to increase their independence and safety with mobility to allow discharge.           If plan is discharge home, recommend the following: A little help with walking and/or transfers;A little help with bathing/dressing/bathroom;Direct supervision/assist for medications management;Assist for transportation;Direct supervision/assist for financial management;Help with stairs or ramp for entrance;Supervision due to cognitive status   Can travel by private vehicle        Equipment Recommendations None recommended by PT  Recommendations for Other Services       Functional Status Assessment Patient has had a recent decline in their functional status and demonstrates the  ability to make significant improvements in function in a reasonable and predictable amount of time.     Precautions / Restrictions Precautions Precautions: Fall Recall of Precautions/Restrictions: Impaired Restrictions Weight Bearing Restrictions Per Provider Order: No      Mobility  Bed Mobility Overal bed mobility: Modified Independent             General bed mobility comments: extra time    Transfers Overall transfer level: Needs assistance Equipment used: Rolling walker (2 wheels) Transfers: Sit to/from Stand Sit to Stand: Supervision           General transfer comment: Supervision for safety, slower to rise, reduced control with descent. Stabilizes with RW for support today.    Ambulation/Gait Ambulation/Gait assistance: Supervision Gait Distance (Feet): 45 Feet Assistive device: Rolling walker (2 wheels) Gait Pattern/deviations: Step-through pattern, Decreased stride length, Steppage, Decreased dorsiflexion - right, Decreased dorsiflexion - left, Drifts right/left, Trunk flexed Gait velocity: dec Gait velocity interpretation: <1.31 ft/sec, indicative of household ambulator   General Gait Details: Reviewed RW use for safety. Trunk fairly flexed, steppage gait compensating for reduced ankle dorsiflexion. No staggering however demonstrates mild instability with turns, requiring supervision for safety and cues for safety/awareness. Spo2 98% on RA, HR in 80s. Pt requires cues for directions, reduced short term memory.  Stairs            Wheelchair Mobility     Tilt Bed    Modified Rankin (Stroke Patients Only)       Balance Overall balance assessment: Needs assistance Sitting-balance support: No upper extremity supported, Feet supported Sitting balance-Leahy Scale: Fair   Postural control: Posterior lean Standing balance support: Single extremity  supported Standing balance-Leahy Scale: Poor                                Pertinent Vitals/Pain Pain Assessment Pain Assessment: No/denies pain    Home Living Family/patient expects to be discharged to:: Private residence Living Arrangements: Group Home Available Help at Discharge: Friend(s) Type of Home: House Home Access: Stairs to enter Entrance Stairs-Rails: Doctor, general practice of Steps: 3-4   Home Layout: Able to live on main level with bedroom/bathroom Home Equipment: Agricultural consultant (2 wheels);Cane - single point      Prior Function Prior Level of Function : Independent/Modified Independent             Mobility Comments: Ambulates with SPC typically ADLs Comments: Only states that "they can help a little bit" referring to staff at group home.     Extremity/Trunk Assessment   Upper Extremity Assessment Upper Extremity Assessment: Defer to OT evaluation    Lower Extremity Assessment Lower Extremity Assessment: Generalized weakness       Communication   Communication Communication: No apparent difficulties    Cognition Arousal: Lethargic Behavior During Therapy: Flat affect   PT - Cognitive impairments: No family/caregiver present to determine baseline, Orientation   Orientation impairments: Time, Situation (incorrect year stated)                     Following commands: Impaired Following commands impaired: Only follows one step commands consistently     Cueing Cueing Techniques: Verbal cues, Gestural cues     General Comments General comments (skin integrity, edema, etc.): SpO2 98% on RA with activity, denies SOB.    Exercises     Assessment/Plan    PT Assessment Patient needs continued PT services  PT Problem List Decreased strength;Decreased activity tolerance;Decreased mobility;Decreased balance;Decreased range of motion;Decreased cognition;Decreased coordination;Decreased knowledge of use of DME;Decreased safety awareness;Decreased knowledge of precautions       PT Treatment  Interventions DME instruction;Gait training;Stair training;Functional mobility training;Therapeutic activities;Therapeutic exercise;Neuromuscular re-education;Balance training;Cognitive remediation;Patient/family education    PT Goals (Current goals can be found in the Care Plan section)  Acute Rehab PT Goals Patient Stated Goal: Get well PT Goal Formulation: With patient Time For Goal Achievement: 07/25/23 Potential to Achieve Goals: Good    Frequency Min 1X/week     Co-evaluation               AM-PAC PT "6 Clicks" Mobility  Outcome Measure Help needed turning from your back to your side while in a flat bed without using bedrails?: None Help needed moving from lying on your back to sitting on the side of a flat bed without using bedrails?: None Help needed moving to and from a bed to a chair (including a wheelchair)?: A Little Help needed standing up from a chair using your arms (e.g., wheelchair or bedside chair)?: A Little Help needed to walk in hospital room?: A Little Help needed climbing 3-5 steps with a railing? : A Little 6 Click Score: 20    End of Session Equipment Utilized During Treatment: Gait belt Activity Tolerance: Patient tolerated treatment well Patient left: in bed;with call bell/phone within reach;with bed alarm set   PT Visit Diagnosis: Unsteadiness on feet (R26.81);Other abnormalities of gait and mobility (R26.89);Muscle weakness (generalized) (M62.81);Other symptoms and signs involving the nervous system (N56.213)    Time: 0865-7846 PT Time Calculation (min) (ACUTE ONLY): 19 min   Charges:  PT Evaluation $PT Eval Low Complexity: 1 Low   PT General Charges $$ ACUTE PT VISIT: 1 Visit         Kathlyn Sacramento, PT, DPT [facility]  Rehabilitation Services Physical Therapist Office: [phone] Website: [email or web address]   Berton Mount 07/11/2023, 11:33 AM

## 2023-07-12 ENCOUNTER — Inpatient Hospital Stay (HOSPITAL_COMMUNITY): Payer: Medicare Other

## 2023-07-12 DIAGNOSIS — N186 End stage renal disease: Secondary | ICD-10-CM | POA: Diagnosis not present

## 2023-07-12 DIAGNOSIS — Z992 Dependence on renal dialysis: Secondary | ICD-10-CM | POA: Diagnosis not present

## 2023-07-12 LAB — RENAL FUNCTION PANEL
Albumin: 2.6 g/dL — ABNORMAL LOW (ref 3.5–5.0)
Anion gap: 21 — ABNORMAL HIGH (ref 5–15)
BUN: 99 mg/dL — ABNORMAL HIGH (ref 8–23)
CO2: 19 mmol/L — ABNORMAL LOW (ref 22–32)
Calcium: 7.3 mg/dL — ABNORMAL LOW (ref 8.9–10.3)
Chloride: 93 mmol/L — ABNORMAL LOW (ref 98–111)
Creatinine, Ser: 12.84 mg/dL — ABNORMAL HIGH (ref 0.61–1.24)
GFR, Estimated: 4 mL/min — ABNORMAL LOW
Glucose, Bld: 77 mg/dL (ref 70–99)
Phosphorus: 9.4 mg/dL — ABNORMAL HIGH (ref 2.5–4.6)
Potassium: 4.4 mmol/L (ref 3.5–5.1)
Sodium: 133 mmol/L — ABNORMAL LOW (ref 135–145)

## 2023-07-12 LAB — IRON AND TIBC
Iron: 22 ug/dL — ABNORMAL LOW (ref 45–182)
Saturation Ratios: 12 % — ABNORMAL LOW (ref 17.9–39.5)
TIBC: 181 ug/dL — ABNORMAL LOW (ref 250–450)
UIBC: 159 ug/dL

## 2023-07-12 LAB — GLUCOSE, CAPILLARY
Glucose-Capillary: 102 mg/dL — ABNORMAL HIGH (ref 70–99)
Glucose-Capillary: 139 mg/dL — ABNORMAL HIGH (ref 70–99)
Glucose-Capillary: 183 mg/dL — ABNORMAL HIGH (ref 70–99)

## 2023-07-12 LAB — FERRITIN: Ferritin: 821 ng/mL — ABNORMAL HIGH (ref 24–336)

## 2023-07-12 MED ORDER — SODIUM CHLORIDE 0.9 % IV SOLN
1.0000 g | Freq: Every day | INTRAVENOUS | Status: DC
  Administered 2023-07-12 – 2023-07-19 (×8): 1 g via INTRAVENOUS
  Filled 2023-07-12 (×9): qty 10

## 2023-07-12 MED ORDER — ATORVASTATIN CALCIUM 40 MG PO TABS
40.0000 mg | ORAL_TABLET | Freq: Every day | ORAL | Status: DC
  Administered 2023-07-12 – 2023-07-19 (×8): 40 mg via ORAL
  Filled 2023-07-12 (×8): qty 1

## 2023-07-12 MED ORDER — FERROUS SULFATE 325 (65 FE) MG PO TABS
325.0000 mg | ORAL_TABLET | Freq: Every day | ORAL | Status: DC
  Administered 2023-07-13 – 2023-07-20 (×8): 325 mg via ORAL
  Filled 2023-07-12 (×8): qty 1

## 2023-07-12 MED ORDER — POLYETHYLENE GLYCOL 3350 17 G PO PACK
17.0000 g | PACK | Freq: Every day | ORAL | Status: DC
  Administered 2023-07-13 – 2023-07-20 (×7): 17 g via ORAL
  Filled 2023-07-12 (×9): qty 1

## 2023-07-12 MED ORDER — CALCIUM CARBONATE ANTACID 500 MG PO CHEW
400.0000 mg | CHEWABLE_TABLET | Freq: Three times a day (TID) | ORAL | Status: AC
  Administered 2023-07-12 – 2023-07-13 (×3): 400 mg via ORAL
  Filled 2023-07-12 (×3): qty 2

## 2023-07-12 MED ORDER — HYDROMORPHONE HCL 2 MG PO TABS
2.0000 mg | ORAL_TABLET | ORAL | Status: DC | PRN
  Administered 2023-07-12 – 2023-07-13 (×2): 2 mg via ORAL
  Filled 2023-07-12 (×2): qty 1

## 2023-07-12 MED ORDER — ASPIRIN 81 MG PO CHEW
81.0000 mg | CHEWABLE_TABLET | Freq: Every day | ORAL | Status: DC
  Administered 2023-07-13 – 2023-07-20 (×8): 81 mg via ORAL
  Filled 2023-07-12 (×8): qty 1

## 2023-07-12 NOTE — Plan of Care (Signed)
Problem: Education: Goal: Knowledge of risk factors and measures for prevention of condition will improve Outcome: Progressing   Problem: Coping: Goal: Psychosocial and spiritual needs will be supported Outcome: Progressing   Problem: Respiratory: Goal: Will maintain a patent airway Outcome: Progressing Goal: Complications related to the disease process, condition or treatment will be avoided or minimized Outcome: Progressing   Problem: Education: Goal: Knowledge of General Education information will improve Description: Including pain rating scale, medication(s)/side effects and non-pharmacologic comfort measures Outcome: Progressing   Problem: Health Behavior/Discharge Planning: Goal: Ability to manage health-related needs will improve Outcome: Progressing   Problem: Clinical Measurements: Goal: Ability to maintain clinical measurements within normal limits will improve Outcome: Progressing Goal: Will remain free from infection Outcome: Progressing Goal: Diagnostic test results will improve Outcome: Progressing Goal: Respiratory complications will improve Outcome: Progressing Goal: Cardiovascular complication will be avoided Outcome: Progressing   Problem: Activity: Goal: Risk for activity intolerance will decrease Outcome: Progressing   Problem: Nutrition: Goal: Adequate nutrition will be maintained Outcome: Progressing   Problem: Coping: Goal: Level of anxiety will decrease Outcome: Progressing   Problem: Elimination: Goal: Will not experience complications related to bowel motility Outcome: Progressing Goal: Will not experience complications related to urinary retention Outcome: Progressing   Problem: Pain Managment: Goal: General experience of comfort will improve and/or be controlled Outcome: Progressing   Problem: Safety: Goal: Ability to remain free from injury will improve Outcome: Progressing   Problem: Skin Integrity: Goal: Risk for impaired  skin integrity will decrease Outcome: Progressing   Problem: Education: Goal: Ability to describe self-care measures that may prevent or decrease complications (Diabetes Survival Skills Education) will improve Outcome: Progressing Goal: Individualized Educational Video(s) Outcome: Progressing   Problem: Coping: Goal: Ability to adjust to condition or change in health will improve Outcome: Progressing   Problem: Fluid Volume: Goal: Ability to maintain a balanced intake and output will improve Outcome: Progressing   Problem: Health Behavior/Discharge Planning: Goal: Ability to identify and utilize available resources and services will improve Outcome: Progressing Goal: Ability to manage health-related needs will improve Outcome: Progressing   Problem: Metabolic: Goal: Ability to maintain appropriate glucose levels will improve Outcome: Progressing   Problem: Nutritional: Goal: Maintenance of adequate nutrition will improve Outcome: Progressing Goal: Progress toward achieving an optimal weight will improve Outcome: Progressing   Problem: Skin Integrity: Goal: Risk for impaired skin integrity will decrease Outcome: Progressing   Problem: Tissue Perfusion: Goal: Adequacy of tissue perfusion will improve Outcome: Progressing

## 2023-07-12 NOTE — Plan of Care (Signed)
  Problem: Education: Goal: Knowledge of risk factors and measures for prevention of condition will improve Outcome: Progressing   Problem: Coping: Goal: Psychosocial and spiritual needs will be supported Outcome: Progressing   Problem: Respiratory: Goal: Will maintain a patent airway Outcome: Progressing Goal: Complications related to the disease process, condition or treatment will be avoided or minimized Outcome: Progressing   Problem: Education: Goal: Knowledge of General Education information will improve Description: Including pain rating scale, medication(s)/side effects and non-pharmacologic comfort measures Outcome: Progressing   Problem: Health Behavior/Discharge Planning: Goal: Ability to manage health-related needs will improve Outcome: Progressing   Problem: Clinical Measurements: Goal: Ability to maintain clinical measurements within normal limits will improve Outcome: Progressing Goal: Will remain free from infection Outcome: Progressing Goal: Diagnostic test results will improve Outcome: Progressing Goal: Respiratory complications will improve Outcome: Progressing Goal: Cardiovascular complication will be avoided Outcome: Progressing   Problem: Activity: Goal: Risk for activity intolerance will decrease Outcome: Progressing   Problem: Nutrition: Goal: Adequate nutrition will be maintained Outcome: Progressing   Problem: Coping: Goal: Level of anxiety will decrease Outcome: Progressing   Problem: Elimination: Goal: Will not experience complications related to bowel motility Outcome: Progressing Goal: Will not experience complications related to urinary retention Outcome: Progressing   Problem: Pain Managment: Goal: General experience of comfort will improve and/or be controlled Outcome: Progressing   Problem: Safety: Goal: Ability to remain free from injury will improve Outcome: Progressing   Problem: Skin Integrity: Goal: Risk for impaired  skin integrity will decrease Outcome: Progressing   Problem: Education: Goal: Ability to describe self-care measures that may prevent or decrease complications (Diabetes Survival Skills Education) will improve Outcome: Progressing Goal: Individualized Educational Video(s) Outcome: Progressing   Problem: Coping: Goal: Ability to adjust to condition or change in health will improve Outcome: Progressing   Problem: Fluid Volume: Goal: Ability to maintain a balanced intake and output will improve Outcome: Progressing   Problem: Health Behavior/Discharge Planning: Goal: Ability to identify and utilize available resources and services will improve Outcome: Progressing Goal: Ability to manage health-related needs will improve Outcome: Progressing   Problem: Metabolic: Goal: Ability to maintain appropriate glucose levels will improve Outcome: Progressing   Problem: Nutritional: Goal: Maintenance of adequate nutrition will improve Outcome: Progressing Goal: Progress toward achieving an optimal weight will improve Outcome: Progressing   Problem: Skin Integrity: Goal: Risk for impaired skin integrity will decrease Outcome: Progressing   Problem: Tissue Perfusion: Goal: Adequacy of tissue perfusion will improve Outcome: Progressing

## 2023-07-12 NOTE — Progress Notes (Signed)
Mobility Specialist: Progress Note   07/12/23 1121  Mobility  Activity Ambulated with assistance in room  Level of Assistance Standby assist, set-up cues, supervision of patient - no hands on  Assistive Device Front wheel walker  Distance Ambulated (ft) 50 ft  Activity Response Tolerated fair  Mobility Referral Yes  Mobility visit 1 Mobility  Mobility Specialist Start Time (ACUTE ONLY) 1042  Mobility Specialist Stop Time (ACUTE ONLY) 1051  Mobility Specialist Time Calculation (min) (ACUTE ONLY) 9 min    Pt was agreeable to mobility session - received in bed. C/o intense pain in genital/scrotum area. First two attempts to stand pt fell back onto the bed in pain. Third attempt, pt was able to stand SV. CG initially for safety and impulsive behavior but eventually became SV by EOS. Left in bed with all needs met, call bell in reach. Bed alarm on.   Maurene Capes Mobility Specialist Please contact via SecureChat or Rehab office at [phone]

## 2023-07-12 NOTE — Progress Notes (Signed)
Progress Note   Patient: Shawn Collins UJW:[id number] DOB: 1950-09-01 DOA: 07/10/2023     2 DOS: the patient was seen and examined on 07/12/2023   Brief hospital course: Shawn Collins is a 72 y.o. male with medical history significant of hypertension, hyperlipidemia, CAD s/p CABG, CVA, COPD, and ESRD on HD presents with complaints of weakness and cough.  His dialysis center had called for welfare check after he had missed his last 3 hemodialysis sessions.  Patient normally lives alone and is able to care for himself.  He reports that he has has not been feeling well as the cause for him missing his last hemodialysis sessions.  Also notes decreased p.o. intake EMS found him to be disheveled with unkept home for which she was brought to the hospital for further evaluation.  His initial blood glucose was 58 received 15 g of oral glucose.  Subsequent glucose 57 for which patient received a second dose of 15 g of oral glucose prior to arrival..       In the emergency department patient was noted to be afebrile, blood pressures elevated up to 229/143.  Labs significant for WBC 4.8, hemoglobin 12.2, sodium 138, potassium 5.7, CO2 15, BUN 162, creatinine 18.2, anion gap 29, calcium 8.1, phosphorus greater than 30, magnesium 2.7, AST 89, ALT 77, BNP greater than 4500. Covid-19 screening was positive.  Chest x-ray showed no active disease.  Assessment and Plan:  Possible epididymitis,Testicular pain Bilateral Hydroceles   Patient reports that he has no testicular pain 2 weeks ago.  He was scheduled to see a urologist in the outpatient setting.  Since hospitalization he has had worsening of this pain. Testicular ultrasound with moderate bilateral hydroceles right greater than left.  Possible left epididymitis.  Discussed case with urology team who recommended IV antibiotics while patient is hospitalized, for a 10-day course. -Will obtain urinalysis and start IV ceftriaxone  COVID 19 infection  Patient's  primary symptom is malaise which has caused him to not take care of himself and miss hemodialysis sessions.  He has no respiratory symptoms. -Continue supportive care -IS/flutter valve -As needed breathing treatments  ESRD on HD Metabolic acidosis with increased anion gap Resolving  Patient has missed several HD sessions in the past.  -Continue HD per nephrology  -May require phos binder  -CM/SW for placement  SW/CM team noted that his clinic SW has concerns about patient's living situation and his ability to care for himself. He has had 2 wellness checks this past month.     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 Resolved with HD.   Hypoglycemia Resolved   Hyperkalemia  Resolved  CAD s/p CABG  Stable. S/p CABG Previously  on aspirin plavix and statin.  Resume aspirin statin  COPD without acute exacerbation PRN albuterol      Subjective: Had testicular pain about two weeks ago. Primary care doctor referred patient to urology but he was not able to make this appointment. Since that time he has had intermittent testicular pain. It has gotten progressively worse here in the hospital though APAP helps with symptoms.   Physical Exam: Vitals:   07/11/23 1735 07/11/23 1943 07/12/23 0542 07/12/23 0744  BP: (!) 168/80 (!) 167/78 (!) 162/78 (!) 150/81  Pulse: 67 68 73 80   Resp: 17 17 18 20   Temp:   99.5 F (37.5 C) 99.5 F (37.5 C)  TempSrc:   Oral Oral  SpO2: 100%  98% 98%  Weight:      Height:        Constitutional: In mild distress.  Cardiovascular: Normal rate, regular rhythm. No lower extremity edema  Pulmonary: Non labored breathing on room air, no wheezing or rales.   Abdominal: Soft. Normal bowel sounds. Non distended and non tender Musculoskeletal: Normal range of motion.     Neurological: Alert and oriented to person, place, and time. Non focal  Skin: Skin is warm and dry.  GU: Large R testicle, tender to light touch and minimal manipulation. No erythema of overlying skin. Exam limited due to pain.   Data Reviewed:  I have reviewed labs and images     Latest Ref Rng & Units 07/11/2023    9:33 AM 07/10/2023    1:04 PM 07/10/2023    1:02 PM  BMP  Glucose 70 - 99 mg/dL 147   85   BUN 8 - 23 mg/dL 87   829   Creatinine 5.62 - 1.24 mg/dL 13.08   65.78   Sodium 135 - 145 mmol/L 132  135  138   Potassium 3.5 - 5.1 mmol/L 4.0  5.5  5.7   Chloride 98 - 111 mmol/L 93   94   CO2 22 - 32 mmol/L 21   15   Calcium 8.9 - 10.3 mg/dL 7.7   8.1       Latest Ref Rng & Units 07/11/2023    9:33 AM 07/10/2023    1:04 PM 07/10/2023    1:02 PM  CBC  WBC 4.0 - 10.5 K/uL 5.1   4.8   Hemoglobin 13.0 - 17.0 g/dL 46.9  62.9  52.8   Hematocrit 39.0 - 52.0 % 32.2  38.0  37.1   Platelets 150 - 400 K/uL 161   166      Family Communication: None  Disposition: Status is: Inpatient Remains inpatient appropriate because: continued need for HD treatments, safe dispotision   Planned Discharge Destination:  Pending clinical course     Time spent: 35 minutes  Author: Marolyn Haller, MD 07/12/2023 8:28 AM  For on call review [email or web address].

## 2023-07-13 DIAGNOSIS — Z992 Dependence on renal dialysis: Secondary | ICD-10-CM | POA: Diagnosis not present

## 2023-07-13 DIAGNOSIS — N186 End stage renal disease: Secondary | ICD-10-CM | POA: Diagnosis not present

## 2023-07-13 DIAGNOSIS — N451 Epididymitis: Secondary | ICD-10-CM | POA: Diagnosis not present

## 2023-07-13 LAB — CBC
HCT: 33.6 % — ABNORMAL LOW (ref 39.0–52.0)
Hemoglobin: 11.2 g/dL — ABNORMAL LOW (ref 13.0–17.0)
MCH: 30.3 pg (ref 26.0–34.0)
MCHC: 33.3 g/dL (ref 30.0–36.0)
MCV: 90.8 fL (ref 80.0–100.0)
Platelets: 178 10*3/uL (ref 150–400)
RBC: 3.7 MIL/uL — ABNORMAL LOW (ref 4.22–5.81)
RDW: 14.6 % (ref 11.5–15.5)
WBC: 6.8 10*3/uL (ref 4.0–10.5)
nRBC: 0 % (ref 0.0–0.2)

## 2023-07-13 LAB — RENAL FUNCTION PANEL
Albumin: 2.7 g/dL — ABNORMAL LOW (ref 3.5–5.0)
Anion gap: 17 — ABNORMAL HIGH (ref 5–15)
BUN: 42 mg/dL — ABNORMAL HIGH (ref 8–23)
CO2: 24 mmol/L (ref 22–32)
Calcium: 7.9 mg/dL — ABNORMAL LOW (ref 8.9–10.3)
Chloride: 92 mmol/L — ABNORMAL LOW (ref 98–111)
Creatinine, Ser: 7.5 mg/dL — ABNORMAL HIGH (ref 0.61–1.24)
GFR, Estimated: 7 mL/min — ABNORMAL LOW (ref >60)
Glucose, Bld: 94 mg/dL (ref 70–99)
Phosphorus: 5.3 mg/dL — ABNORMAL HIGH (ref 2.5–4.6)
Potassium: 3.8 mmol/L (ref 3.5–5.1)
Sodium: 133 mmol/L — ABNORMAL LOW (ref 135–145)

## 2023-07-13 LAB — GLUCOSE, CAPILLARY
Glucose-Capillary: 99 mg/dL (ref 70–99)
Glucose-Capillary: 98 mg/dL (ref 70–99)
Glucose-Capillary: 95 mg/dL (ref 70–99)
Glucose-Capillary: 83 mg/dL (ref 70–99)

## 2023-07-13 MED ORDER — CHLORHEXIDINE GLUCONATE CLOTH 2 % EX PADS: 6.0000 | MEDICATED_PAD | Freq: Every day | CUTANEOUS | Status: DC

## 2023-07-13 MED ORDER — ENSURE ENLIVE PO LIQD
237.0000 mL | Freq: Two times a day (BID) | ORAL | Status: DC
  Administered 2023-07-14 – 2023-07-20 (×10): 237 mL via ORAL

## 2023-07-13 MED ORDER — DOXYCYCLINE HYCLATE 100 MG PO TABS
100.0000 mg | ORAL_TABLET | Freq: Two times a day (BID) | ORAL | Status: DC
  Administered 2023-07-13 – 2023-07-20 (×15): 100 mg via ORAL
  Filled 2023-07-13 (×15): qty 1

## 2023-07-13 NOTE — [place] FL2 (Signed)
[place] MEDICAID FL2 LEVEL OF CARE FORM     IDENTIFICATION  Patient Name: Shawn Collins Birthdate: 1950-10-10 Sex: male Admission Date (Current Location): 07/10/2023  [facility] and [place] Number:  Producer, television/film/video and Address:  [facility], [address], [place], [place] [address]      Provider Number: [id number]  Attending Physician Name and Address:  Marolyn Haller, MD  Relative Name and Phone Number:  Denice Paradise (Niece)  [phone]    Current Level of Care: Hospital Recommended Level of Care: Skilled Nursing Facility Prior Approval Number:    Date Approved/Denied:   PASRR Number: [id number]  Discharge Plan: SNF    Current Diagnoses: Patient Active Problem List   Diagnosis Date Noted   COVID-19 07/10/2023   Hyperkalemia 07/10/2023   Metabolic acidosis with increased anion gap and accumulation of organic acids 07/10/2023   Hypertensive urgency 07/10/2023   Hypoglycemia 07/10/2023   Weakness 07/10/2023   Syncope 07/05/2022   Syncope and collapse 07/04/2022   Type 2 diabetes mellitus (HCC) 07/04/2022   Anemia of chronic disease 07/04/2022   Acute metabolic encephalopathy due to hypoglycemia 12/30/2021   Hypothermia 12/30/2021   Uncontrolled type 2 diabetes mellitus with hypoglycemia, with long-term current use of insulin (HCC) 12/30/2021   History of CVA (cerebrovascular accident) 12/30/2021   DNR (do not resuscitate) 12/30/2021   Stroke determined by clinical assessment (HCC) 12/18/2021   ESRD (end stage renal disease) on dialysis (HCC) 02/23/2021   DOE (dyspnea on exertion) 11/22/2017   Hyperlipidemia LDL goal <70 04/17/2017   CAD (coronary artery disease), native coronary artery    S/P CABG x 4 10/26/2016   Acute kidney injury superimposed on CKD (HCC)    Abnormal nuclear stress test    Nausea & vomiting 10/16/2016   NSTEMI (non-ST elevated myocardial infarction) (HCC) 10/16/2016   CKD (chronic kidney disease), stage III  (HCC) 10/16/2016   COPD exacerbation (HCC) 09/10/2014   COPD (chronic obstructive pulmonary disease) (HCC) 09/10/2014   Viral gastroenteritis 09/10/2014   Hypoxia 09/10/2014   Dizzy-->?Orthostatic? 09/10/2014   Diabetes mellitus without complication (HCC) 09/10/2014   Hypertension 09/10/2014   Emphysema lung (HCC) 09/10/2014    Orientation RESPIRATION BLADDER Height & Weight     Time, Self, Situation, Place  Normal Continent Weight: 135 lb 2.3 oz (61.3 kg) Height:  5\' 10"  (177.8 cm)  BEHAVIORAL SYMPTOMS/MOOD NEUROLOGICAL BOWEL NUTRITION STATUS      Continent Diet (see DC summary)  AMBULATORY STATUS COMMUNICATION OF NEEDS Skin   Limited Assist Verbally Normal                       Personal Care Assistance Level of Assistance  Bathing, Feeding, Dressing Bathing Assistance: Limited assistance Feeding assistance: Independent Dressing Assistance: Limited assistance     Functional Limitations Info  Sight, Hearing, Speech Sight Info: Adequate Hearing Info: Adequate Speech Info: Adequate    SPECIAL CARE FACTORS FREQUENCY  PT (By licensed PT), OT (By licensed OT)     PT Frequency: 5x/week OT Frequency: 5x/week            Contractures      Additional Factors Info  Allergies, Code Status, Isolation Precautions Code Status Info: FULL Allergies Info: No Known Allergies     Isolation Precautions Info: Airborne and contact precautions, Covid, 07/10/23     Current Medications (07/13/2023):  This is the current hospital active medication list Current Facility-Administered Medications  Medication Dose Route Frequency Provider Last Rate  Last Admin   acetaminophen (TYLENOL) tablet 650 mg  650 mg Oral Q6H PRN Madelyn Flavors A, MD   650 mg at 07/12/23 1526   Or   acetaminophen (TYLENOL) suppository 650 mg  650 mg Rectal Q6H PRN Madelyn Flavors A, MD       albuterol (PROVENTIL) (2.5 MG/3ML) 0.083% nebulizer solution 2.5 mg  2.5 mg Nebulization Q2H PRN Katrinka Blazing, Rondell A, MD        amLODipine (NORVASC) tablet 10 mg  10 mg Oral QHS Sabra Heck B, MD   10 mg at 07/12/23 2109   aspirin chewable tablet 81 mg  81 mg Oral Daily Marolyn Haller, MD   81 mg at 07/13/23 0734   atorvastatin (LIPITOR) tablet 40 mg  40 mg Oral QHS Marolyn Haller, MD   40 mg at 07/12/23 2108   cefTRIAXone (ROCEPHIN) 1 g in sodium chloride 0.9 % 100 mL IVPB  1 g Intravenous Daily Marolyn Haller, MD 200 mL/hr at 07/12/23 1800 Infusion Verify at 07/12/23 1800   Chlorhexidine Gluconate Cloth 2 % PADS 6 each  6 each Topical Q0600 Berenda Morale, NP   6 each at 07/13/23 0557   dextrose 50 % solution 50 mL  1 ampule Intravenous PRN Madelyn Flavors A, MD   50 mL at 07/11/23 0854   doxycycline (VIBRA-TABS) tablet 100 mg  100 mg Oral Q12H Marolyn Haller, MD   100 mg at 07/13/23 1039   ferrous sulfate tablet 325 mg  325 mg Oral Q breakfast Marolyn Haller, MD   325 mg at 07/13/23 0734   guaiFENesin (MUCINEX) 12 hr tablet 600 mg  600 mg Oral BID Madelyn Flavors A, MD   600 mg at 07/13/23 0734   heparin injection 5,000 Units  5,000 Units Subcutaneous Q8H Madelyn Flavors A, MD   5,000 Units at 07/13/23 0554   hydrALAZINE (APRESOLINE) tablet 50 mg  50 mg Oral Q8H Marolyn Haller, MD   50 mg at 07/13/23 0557   HYDROmorphone (DILAUDID) tablet 2 mg  2 mg Oral Q4H PRN Marolyn Haller, MD   2 mg at 07/13/23 1043   losartan (COZAAR) tablet 100 mg  100 mg Oral Daily Sabra Heck B, MD   100 mg at 07/13/23 0740   polyethylene glycol (MIRALAX / GLYCOLAX) packet 17 g  17 g Oral Daily Marolyn Haller, MD   17 g at 07/13/23 4098     Discharge Medications: Please see discharge summary for a list of discharge medications.  Relevant Imaging Results:  Relevant Lab Results:   Additional Information SSN: [id number]; Covid + 07/10/23 out-pt HD at [facility] on MWF 10:25 am chair time.  Micaiah Litle A [place], LCSW

## 2023-07-13 NOTE — Progress Notes (Signed)
Physical Therapy Treatment Patient Details Name: Shawn Collins MRN: [id number] DOB: 12/12/1950 Today's Date: 07/13/2023   History of Present Illness 51M  presenting 2/10 with weakness, uremia and confusion after missing dialysis for over 1 week, found to be COVID-19 positive; hypertensive urgency. PMHx: hypertension, hyperlipidemia, CAD s/p CABG, CVA, COPD, ESRD on HD MWF.    PT Comments  Pt resting in bed on arrival and agreeable to session with slow but steady progress towards acute goals. Pt demonstrating bed mobility with CGA for safety as pt moving quickly due to scrotal pain. Pt requiring up to mod A to steady on rise to stand as pt with posterior lean, bracing Les against EOB, and unable to correct without assist. Pt needing up to mod A to maintain balance during gait and max cues for safety with RW for support. Pt continues to be limited by decreased activity tolerance, impaired balance/postural reactions, poor safety awareness and weakness, all placing pt at increased risk for falls. Pt without adequate home assist and will benefit from continued inpatient follow up therapy, <3 hours/day to maximize functional mobility, independence and safety. Will continue to follow acutely.    If plan is discharge home, recommend the following: A little help with walking and/or transfers;A little help with bathing/dressing/bathroom;Direct supervision/assist for medications management;Assist for transportation;Direct supervision/assist for financial management;Help with stairs or ramp for entrance;Supervision due to cognitive status;Assistance with cooking/housework   Can travel by private vehicle     No  Equipment Recommendations  None recommended by PT    Recommendations for Other Services       Precautions / Restrictions Precautions Precautions: Fall Recall of Precautions/Restrictions: Impaired Restrictions Weight Bearing Restrictions Per Provider Order: No     Mobility  Bed  Mobility Overal bed mobility: Needs Assistance Bed Mobility: Supine to Sit, Sit to Supine     Supine to sit: Contact guard Sit to supine: Contact guard assist   General bed mobility comments: CGA for safety as pt moving quickly due to scrotal pain    Transfers Overall transfer level: Needs assistance Equipment used: Rolling walker (2 wheels) Transfers: Sit to/from Stand Sit to Stand: Min assist, Mod assist           General transfer comment: min A to boost to stand from EOB and standard arm chair, mod A to gain balance as pt bracing LES against bed and with posterior lean needing physcial assist to correct    Ambulation/Gait Ambulation/Gait assistance: Min assist, Mod assist Gait Distance (Feet): 35 Feet (+ 70') Assistive device: Rolling walker (2 wheels) Gait Pattern/deviations: Step-through pattern, Decreased stride length, Steppage, Decreased dorsiflexion - right, Decreased dorsiflexion - left, Drifts right/left, Trunk flexed Gait velocity: dec     General Gait Details: max cues for safety with keeping RW in contact with floor, mod A to maintain balance and prevent fall in tight spaces as pt with posterior lean, min A to steady in hall   Stairs             Wheelchair Mobility     Tilt Bed    Modified Rankin (Stroke Patients Only)       Balance Overall balance assessment: Needs assistance Sitting-balance support: No upper extremity supported, Feet supported Sitting balance-Leahy Scale: Fair Sitting balance - Comments: EOB ADLs Postural control: Posterior lean Standing balance support: Bilateral upper extremity supported, During functional activity Standing balance-Leahy Scale: Poor Standing balance comment: mod A to maintain static standing without UE support  Communication Communication Communication: No apparent difficulties  Cognition Arousal: Alert Behavior During Therapy: Impulsive, Flat affect   PT -  Cognitive impairments: No family/caregiver present to determine baseline, Orientation, Awareness, Sequencing, Problem solving, Safety/Judgement                       PT - Cognition Comments: pt impulsive, picking up RW  throughout session, needing max cues to keep on floor, and max cues generally for safety Following commands: Impaired Following commands impaired: Only follows one step commands consistently, Follows multi-step commands inconsistently    Cueing Cueing Techniques: Verbal cues, Gestural cues, Tactile cues  Exercises      General Comments General comments (skin integrity, edema, etc.): VSS      Pertinent Vitals/Pain Pain Assessment Pain Assessment: Faces Faces Pain Scale: Hurts even more Pain Location: scrotum Pain Descriptors / Indicators: Grimacing, Guarding Pain Intervention(s): Premedicated before session, Limited activity within patient's tolerance, Monitored during session    Home Living                          Prior Function            PT Goals (current goals can now be found in the care plan section) Acute Rehab PT Goals PT Goal Formulation: With patient Time For Goal Achievement: 07/25/23 Progress towards PT goals: Progressing toward goals    Frequency    Min 1X/week      PT Plan      Co-evaluation              AM-PAC PT "6 Clicks" Mobility   Outcome Measure  Help needed turning from your back to your side while in a flat bed without using bedrails?: A Little Help needed moving from lying on your back to sitting on the side of a flat bed without using bedrails?: A Little Help needed moving to and from a bed to a chair (including a wheelchair)?: A Little Help needed standing up from a chair using your arms (e.g., wheelchair or bedside chair)?: A Little Help needed to walk in hospital room?: A Lot Help needed climbing 3-5 steps with a railing? : Total 6 Click Score: 15    End of Session Equipment Utilized During  Treatment: Gait belt Activity Tolerance: Patient tolerated treatment well Patient left: in bed;with call bell/phone within reach;with bed alarm set Nurse Communication: Mobility status PT Visit Diagnosis: Unsteadiness on feet (R26.81);Other abnormalities of gait and mobility (R26.89);Muscle weakness (generalized) (M62.81);Other symptoms and signs involving the nervous system (R29.898)     Time: 4098-1191 PT Time Calculation (min) (ACUTE ONLY): 24 min  Charges:    $Gait Training: 8-22 mins $Therapeutic Activity: 8-22 mins PT General Charges $$ ACUTE PT VISIT: 1 Visit                     Davied Nocito R. PTA Acute Rehabilitation Services Office: [phone]   Catalina Antigua 07/13/2023, 11:33 AM

## 2023-07-13 NOTE — Plan of Care (Signed)
  Problem: Education: Goal: Knowledge of risk factors and measures for prevention of condition will improve Outcome: Progressing   Problem: Coping: Goal: Psychosocial and spiritual needs will be supported Outcome: Progressing   Problem: Respiratory: Goal: Will maintain a patent airway Outcome: Progressing Goal: Complications related to the disease process, condition or treatment will be avoided or minimized Outcome: Progressing   Problem: Education: Goal: Knowledge of General Education information will improve Description: Including pain rating scale, medication(s)/side effects and non-pharmacologic comfort measures Outcome: Progressing   Problem: Health Behavior/Discharge Planning: Goal: Ability to manage health-related needs will improve Outcome: Progressing   Problem: Clinical Measurements: Goal: Ability to maintain clinical measurements within normal limits will improve Outcome: Progressing Goal: Will remain free from infection Outcome: Progressing Goal: Diagnostic test results will improve Outcome: Progressing Goal: Respiratory complications will improve Outcome: Progressing Goal: Cardiovascular complication will be avoided Outcome: Progressing   Problem: Activity: Goal: Risk for activity intolerance will decrease Outcome: Progressing   Problem: Nutrition: Goal: Adequate nutrition will be maintained Outcome: Progressing   Problem: Coping: Goal: Level of anxiety will decrease Outcome: Progressing   Problem: Elimination: Goal: Will not experience complications related to bowel motility Outcome: Progressing Goal: Will not experience complications related to urinary retention Outcome: Progressing   Problem: Pain Managment: Goal: General experience of comfort will improve and/or be controlled Outcome: Progressing   Problem: Safety: Goal: Ability to remain free from injury will improve Outcome: Progressing   Problem: Skin Integrity: Goal: Risk for impaired  skin integrity will decrease Outcome: Progressing   Problem: Education: Goal: Ability to describe self-care measures that may prevent or decrease complications (Diabetes Survival Skills Education) will improve Outcome: Progressing Goal: Individualized Educational Video(s) Outcome: Progressing   Problem: Coping: Goal: Ability to adjust to condition or change in health will improve Outcome: Progressing   Problem: Fluid Volume: Goal: Ability to maintain a balanced intake and output will improve Outcome: Progressing   Problem: Health Behavior/Discharge Planning: Goal: Ability to identify and utilize available resources and services will improve Outcome: Progressing Goal: Ability to manage health-related needs will improve Outcome: Progressing   Problem: Metabolic: Goal: Ability to maintain appropriate glucose levels will improve Outcome: Progressing   Problem: Nutritional: Goal: Maintenance of adequate nutrition will improve Outcome: Progressing Goal: Progress toward achieving an optimal weight will improve Outcome: Progressing   Problem: Skin Integrity: Goal: Risk for impaired skin integrity will decrease Outcome: Progressing   Problem: Tissue Perfusion: Goal: Adequacy of tissue perfusion will improve Outcome: Progressing

## 2023-07-13 NOTE — Progress Notes (Signed)
Received patient in bed to unit.  Alert and oriented.  Informed consent signed and in chart.   TX duration:3.5 hours  Patient tolerated well.  Transported back to the room  Alert, without acute distress.  Hand-off given to patient's nurse.   Access used: graft Access issues: none  Total UF removed: 3000 ml Medication(s) given: none Post HD VS: 181/66 Post HD weight: 61.3 kg   07/13/23 0418  Vitals  Temp 98.7 F (37.1 C)  Temp Source Oral  BP (!) 181/66  MAP (mmHg) 94  BP Location Left Arm  BP Method Automatic  Patient Position (if appropriate) Lying  Pulse Rate 82  Pulse Rate Source Monitor  ECG Heart Rate 81  Resp 12  Oxygen Therapy  SpO2 100 %  O2 Device Nasal Cannula  O2 Flow Rate (L/min) 2 L/min  Patient Activity (if Appropriate) In bed  Pulse Oximetry Type Intermittent  During Treatment Monitoring  Blood Flow Rate (mL/min) 0 mL/min  Arterial Pressure (mmHg) -0.2 mmHg  Venous Pressure (mmHg) -0.61 mmHg  TMP (mmHg) -26.26 mmHg  Ultrafiltration Rate (mL/min) 1225 mL/min  Dialysate Flow Rate (mL/min) 300 ml/min  Duration of HD Treatment -hour(s) 3.5 hour(s)  Cumulative Fluid Removed (mL) per Treatment  3000.27  Post Treatment  Dialyzer Clearance Lightly streaked  Hemodialysis Intake (mL) 0 mL  Liters Processed 84  Fluid Removed (mL) 3000 mL  Tolerated HD Treatment Yes  Post-Hemodialysis Comments HD tx achieved as expected, tolerated well, no complaints.  pt is stable.  AVG/AVF Arterial Site Held (minutes) 10 minutes  AVG/AVF Venous Site Held (minutes) 10 minutes  Note  Patient Observations pt is in bed resting  Fistula / Graft Left Upper arm Arteriovenous vein graft  Placement Date/Time: 12/21/21 1103   Orientation: Left  Access Location: Upper arm  Access Type: Arteriovenous vein graft  Site Condition No complications  Fistula / Graft Assessment Present;Thrill;Bruit  Status Deaccessed  Drainage Description None      [facility]

## 2023-07-13 NOTE — Progress Notes (Signed)
Progress Note   Patient: Shawn Collins MVH:[id number] DOB: 1951-02-07 DOA: 07/10/2023     3 DOS: the patient was seen and examined on 07/13/2023   Brief hospital course: Shawn Collins is a 73 y.o. male with medical history significant of hypertension, hyperlipidemia, CAD s/p CABG, CVA, COPD, and ESRD on HD presents with complaints of weakness and cough.  His dialysis center had called for welfare check after he had missed his last 3 hemodialysis sessions.  Patient normally lives alone and is able to care for himself.  He reports that he has has not been feeling well as the cause for him missing his last hemodialysis sessions.  Also notes decreased p.o. intake EMS found him to be disheveled with unkept home for which she was brought to the hospital for further evaluation.  His initial blood glucose was 58 received 15 g of oral glucose.  Subsequent glucose 57 for which patient received a second dose of 15 g of oral glucose prior to arrival..       In the emergency department patient was noted to be afebrile, blood pressures elevated up to 229/143.  Labs significant for WBC 4.8, hemoglobin 12.2, sodium 138, potassium 5.7, CO2 15, BUN 162, creatinine 18.2, anion gap 29, calcium 8.1, phosphorus greater than 30, magnesium 2.7, AST 89, ALT 77, BNP greater than 4500. Covid-19 screening was positive.  Chest x-ray showed no active disease.  Assessment and Plan:  Possible epididymitis,Testicular pain Bilateral Hydroceles   Patient reports that he has had testicular pain starting 2 weeks ago.  Notes he did have a recent sexual partner.  He was scheduled to see a urologist in the outpatient setting.  Since hospitalization he has had worsening of this pain. Testicular ultrasound with moderate bilateral hydroceles right greater than left.  Possible left epididymitis.  Discussed case with urology team who recommended IV antibiotics while patient is hospitalized, for a 10-day course. -Continue IV ceftriaxone while  inpatient.  Will start doxycycline given recent sexual intercourse prior to episode.   COVID 19 infection  Patient's primary symptom is malaise which has caused him to not take care of himself and miss hemodialysis sessions recently.  He has no respiratory symptoms. -Continue supportive care -IS/flutter valve -As needed breathing treatments  ESRD on HD Metabolic acidosis with increased anion gap resolved Patient has missed several HD sessions in the past.  -Continue HD per nephrology  -May require phos binder  -CM/SW for placement  SW/CM team noted that his clinic SW has concerns about patient's living situation and his ability to care for himself. He has had 2 wellness checks this past month.     Anemia of CKD with iron deficiency     Latest Ref Rng & Units 07/13/2023    8:08 AM 07/11/2023    9:33 AM 07/10/2023    1:04 PM  CBC  WBC 4.0 - 10.5 K/uL 6.8  5.1    Hemoglobin 13.0 - 17.0 g/dL 84.1  32.4  40.1   Hematocrit 39.0 - 52.0 % 33.6  32.2  38.0   Platelets 150 - 400 K/uL 178  161      Component Value Date/Time   IRON 22 (L) 07/12/2023 0630   TIBC 181 (L) 07/12/2023 0630   FERRITIN 821 (H) 07/12/2023 0630   IRONPCTSAT 12 (L) 07/12/2023 0630  Stable. -Continue PO iron supplementation  -ESA per nephrology   Hyponatremia , Likely in the setting of ESRD and malnutrition To monitor  Hypertension  Continue losartan, hydralazine, and  amlodipine   Toxic/Metabolic encephalopathy Resolved  In the setting of COVID infection and uremia. Resolved with HD.   Hypoglycemia Resolved  CBG (last 3)  Recent Labs    07/13/23 0807 07/13/23 1219 07/13/23 1634  GLUCAP 99 98 95    Hyperkalemia  Resolved  CAD s/p CABG  Stable. S/p CABG Previously on aspirin plavix and statin.  Resume aspirin statin  COPD without acute exacerbation PRN albuterol    Severe malnutrition BMI of 19 Consult dietitian Meal supplements    Subjective: Had testicular pain about two weeks ago.  Primary care doctor referred patient to urology but he was not able to make this appointment. Since that time he has had intermittent testicular pain. It has gotten progressively worse here in the hospital though APAP helps with symptoms.   Physical Exam: Vitals:   07/13/23 0514 07/13/23 0553 07/13/23 0732 07/13/23 1230  BP:  (!) 171/85 (!) 160/71 (!) 158/62  Pulse:  81 87 73  Resp:  17 20   Temp:   99.1 F (37.3 C)   TempSrc:   Oral   SpO2:  100% 100% 100%  Weight: 61.3 kg     Height:         Physical Exam  Constitutional: In no distress. Thin, disheveled  Cardiovascular: Normal rate, regular rhythm. No lower extremity edema  Pulmonary: Non labored breathing on room air, no wheezing or rales.   Abdominal: Soft. Normal bowel sounds. Non distended and non tender Musculoskeletal: Normal range of motion.     Neurological: Alert and oriented to person, place, and time. Non focal  Skin: Skin is warm and dry.  GU: deferred    Data Reviewed:  I have reviewed labs and images     Latest Ref Rng & Units 07/13/2023    6:36 AM 07/12/2023    6:30 AM 07/11/2023    9:33 AM  BMP  Glucose 70 - 99 mg/dL 94  77  161   BUN 8 - 23 mg/dL 42  99  87   Creatinine 0.61 - 1.24 mg/dL 0.96  04.54  09.81   Sodium 135 - 145 mmol/L 133  133  132   Potassium 3.5 - 5.1 mmol/L 3.8  4.4  4.0   Chloride 98 - 111 mmol/L 92  93  93   CO2 22 - 32 mmol/L 24  19  21    Calcium 8.9 - 10.3 mg/dL 7.9  7.3  7.7       Latest Ref Rng & Units 07/13/2023    8:08 AM 07/11/2023    9:33 AM 07/10/2023    1:04 PM  CBC  WBC 4.0 - 10.5 K/uL 6.8  5.1    Hemoglobin 13.0 - 17.0 g/dL 19.1  47.8  29.5   Hematocrit 39.0 - 52.0 % 33.6  32.2  38.0   Platelets 150 - 400 K/uL 178  161       Family Communication: None  Disposition: Status is: Inpatient Remains inpatient appropriate because: continued need for HD treatments, safe dispotision   Planned Discharge Destination: SNF     Time spent: 35  minutes  Author: Marolyn Haller, MD 07/13/2023 4:54 PM  For on call review [email or web address].

## 2023-07-13 NOTE — Progress Notes (Signed)
Occupational Therapy Treatment Patient Details Name: Deaunte Dente MRN: [id number] DOB: 1951/04/29 Today's Date: 07/13/2023   History of present illness 57M  presenting 2/10 with weakness, uremia and confusion after missing dialysis for over 1 week, found to be COVID-19 positive; hypertensive urgency. PMHx: hypertension, hyperlipidemia, CAD s/p CABG, CVA, COPD, ESRD on HD MWF.   OT comments  Pt c/o scrotal pain, grimacing, significant pain affecting ability to sit on EOB, notable swelling. Pt continues to have poor balance when standing with RW, several instances of LOB, mod A from therapist to maintain balance. Pt unable to recover balance and is high fall risk. Pt ambulated to toilet and back and became nauseous, small amount of emesis after a few minutes of nausea. Pt would benefit from continued acute OT to progress as able. Changing DC plans to postacute rehab, currently not at level to return home safely without constant assist for all OOB activities.       If plan is discharge home, recommend the following:  A little help with walking and/or transfers;A little help with bathing/dressing/bathroom;Assistance with cooking/housework;Assist for transportation;Help with stairs or ramp for entrance   Equipment Recommendations  Other (comment) (defer)    Recommendations for Other Services      Precautions / Restrictions Precautions Precautions: Fall Recall of Precautions/Restrictions: Impaired Restrictions Weight Bearing Restrictions Per Provider Order: No       Mobility Bed Mobility Overal bed mobility: Needs Assistance Bed Mobility: Supine to Sit, Sit to Supine     Supine to sit: Min assist Sit to supine: Contact guard assist   General bed mobility comments: min A to power sitting up due to scrotal pain    Transfers Overall transfer level: Needs assistance Equipment used: Rolling walker (2 wheels) Transfers: Sit to/from Stand, Bed to chair/wheelchair/BSC Sit to Stand: Mod  assist     Step pivot transfers: Mod assist     General transfer comment: mod A to maintain balance with RW, several instances of LOB     Balance Overall balance assessment: Needs assistance Sitting-balance support: Single extremity supported, Feet supported Sitting balance-Leahy Scale: Fair Sitting balance - Comments: EOB ADLs, but due to scrotal pain leans back or to the side to relieve pain Postural control: Posterior lean Standing balance support: Bilateral upper extremity supported, During functional activity, Reliant on assistive device for balance Standing balance-Leahy Scale: Poor Standing balance comment: mod A to maintain balance, several instances of LOB                           ADL either performed or assessed with clinical judgement   ADL Overall ADL's : Needs assistance/impaired Eating/Feeding: Set up;Sitting   Grooming: Set up;Sitting                   Toilet Transfer: Moderate assistance;Rolling walker (2 wheels);Comfort height toilet;Ambulation   Toileting- Clothing Manipulation and Hygiene: Moderate assistance;Sitting/lateral lean;Sit to/from stand       Functional mobility during ADLs: Moderate assistance;Rolling walker (2 wheels) General ADL Comments: Pt has poor balance, several instances of LOB standing with RW, mod A to maintain. Pt has poor safety awareness, able to complete UB ADLs sitting EOB    Extremity/Trunk Assessment              Vision       Perception     Praxis     Communication Communication Communication: Impaired Factors Affecting Communication: Reduced clarity of speech   Cognition  Arousal: Alert Behavior During Therapy: Impulsive, Flat affect Cognition: No family/caregiver present to determine baseline             OT - Cognition Comments: Pt has poor safety awareness, but able to follow commands as needed throughout session. Pt has poor balance and unaware of fall risk with standing.                  Following commands: Intact        Cueing      Exercises      Shoulder Instructions       General Comments VSS    Pertinent Vitals/ Pain       Pain Assessment Pain Assessment: Faces Faces Pain Scale: Hurts even more Pain Location: scrotum Pain Descriptors / Indicators: Grimacing, Guarding Pain Intervention(s): Monitored during session  Home Living                                          Prior Functioning/Environment              Frequency  Min 1X/week        Progress Toward Goals  OT Goals(current goals can now be found in the care plan section)  Progress towards OT goals: Progressing toward goals  Acute Rehab OT Goals Patient Stated Goal: to improve balance OT Goal Formulation: With patient Time For Goal Achievement: 07/25/23 Potential to Achieve Goals: Good ADL Goals Pt Will Perform Lower Body Dressing: with modified independence Pt Will Transfer to Toilet: with modified independence Pt Will Perform Toileting - Clothing Manipulation and hygiene: with modified independence Additional ADL Goal #1: Pt will be able to maintain safety awareness 5/5 times without need for verbal cueing to use RW for mobility as needed.  Plan      Co-evaluation                 AM-PAC OT "6 Clicks" Daily Activity     Outcome Measure   Help from another person eating meals?: A Little Help from another person taking care of personal grooming?: A Little Help from another person toileting, which includes using toliet, bedpan, or urinal?: A Lot Help from another person bathing (including washing, rinsing, drying)?: A Lot Help from another person to put on and taking off regular upper body clothing?: A Little Help from another person to put on and taking off regular lower body clothing?: A Lot 6 Click Score: 15    End of Session Equipment Utilized During Treatment: Gait belt;Rolling walker (2 wheels)  OT Visit Diagnosis: Unsteadiness  on feet (R26.81);Other abnormalities of gait and mobility (R26.89);Muscle weakness (generalized) (M62.81)   Activity Tolerance Patient tolerated treatment well   Patient Left in bed;with call bell/phone within reach;with nursing/sitter in room   Nurse Communication Mobility status        Time: 1150-1206 OT Time Calculation (min): 16 min  Charges: OT General Charges $OT Visit: 1 Visit OT Treatments $Self Care/Home Management : 8-22 mins  [place], OTR/L   Alexis Goodell 07/13/2023, 12:43 PM

## 2023-07-13 NOTE — Progress Notes (Signed)
Admit: 07/10/2023 LOS: 3  63M ESRD MWF presenting with weakness, uremia and confusion after missing dialysis for over 1 week, COVID-19 positive; hypertensive urgency  Subjective:  Blood pressures much better after initiation/starting amlodipine, hydralazine, losartan. 210/110 on admission --> 150-180/ 60-90 today.  No direct therapies for COVID-19 He says he did not go to outpatient HD because he was feeling weak and fatigued  02/12 0701 - 02/13 0700 In: 100 [IV Piggyback:100] Out: 3000   Filed Weights   07/12/23 1523 07/13/23 0513 07/13/23 0514  Weight: 60 kg 64.6 kg 61.3 kg    Scheduled Meds:  amLODipine  10 mg Oral QHS   aspirin  81 mg Oral Daily   atorvastatin  40 mg Oral QHS   Chlorhexidine Gluconate Cloth  6 each Topical Q0600   doxycycline  100 mg Oral Q12H   ferrous sulfate  325 mg Oral Q breakfast   guaiFENesin  600 mg Oral BID   heparin  5,000 Units Subcutaneous Q8H   hydrALAZINE  50 mg Oral Q8H   losartan  100 mg Oral Daily   polyethylene glycol  17 g Oral Daily   Continuous Infusions:  cefTRIAXone (ROCEPHIN)  IV 200 mL/hr at 07/12/23 1800   PRN Meds:.acetaminophen **OR** acetaminophen, albuterol, dextrose, HYDROmorphone  Current Labs: reviewed    Physical Exam:  Blood pressure (!) 160/71, pulse 87, temperature 99.1 F (37.3 C), temperature source Oral, resp. rate 20, height 5\' 10"  (1.778 m), weight 61.3 kg, SpO2 100%. NAD, poor historian, disheveled/unkempt appearance Regular, normal S1 and S2 Clear bilaterally without wheezing or crackles Left arm AV graft +bruit and thrill No peripheral edema   Renal-related home meds:  - norvasc 2.5 hs - others: sl ntg prn    Dialysis Orders: MWF [facility]  3h  B400  57kg 2/2 bath  Heparin 5000   L AVG Mircera 75 mcg q2wks - recently restarted Hectorol IV qHD - last dose 06/30/23 Sensipar 30mg  with HD - last dose 06/30/23  A Confusion/weakness: Likely uremia contributing, also COVID-19 Uremia: after  missing dialysis ESRD: on hemodialysis MWF: has had HD here 2/10 and 2/12. Next HD tomorrow.  Chronic COVID-19 infection: no specific therapies, on room air with normal SpO2 Anemia of CKD: Hemoglobin 12.9 at presentation, trend CKD-BMD/secondary hyperparathyroidism: corrected calcium stable.  Admission phosphorus greater than 30 likely artifact, trend.  If remains elevated can restart binders. Metabolic acidosis: secondary to ESRD and missing dialysis Severe hypertension w/ nonadherence: getting norvasc (2.5 home > 10mg  here) and new losartan and hydralazine here. Initial BP's 210/105, now around 160/ 70-85 sig improved.   P Next dialysis tomorrow, 2-3L UF PT/OT, disposition pending Medication Issues; Preferred narcotic agents for pain control are hydromorphone, fentanyl, and methadone. Morphine should not be used.  Baclofen should be avoided Avoid oral sodium phosphate and magnesium citrate based laxatives / bowel preps    Sabra Heck MD 07/13/2023, 10:56 AM  Recent Labs  Lab 07/11/23 0933 07/12/23 0630 07/13/23 0636  NA 132* 133* 133*  K 4.0 4.4 3.8  CL 93* 93* 92*  CO2 21* 19* 24  GLUCOSE 145* 77 94  BUN 87* 99* 42*  CREATININE 12.01* 12.84* 7.50*  CALCIUM 7.7* 7.3* 7.9*  PHOS 8.0* 9.4* 5.3*   Recent Labs  Lab 07/10/23 1302 07/10/23 1304 07/11/23 0933 07/13/23 0808  WBC 4.8  --  5.1 6.8  NEUTROABS 3.9  --   --   --   HGB 12.2* 12.9* 11.1* 11.2*  HCT 37.1* 38.0*  32.2* 33.6*  MCV 94.2  --  89.2 90.8  PLT 166  --  161 178

## 2023-07-13 NOTE — TOC Progression Note (Signed)
Transition of Care [facility]) - Progression Note    Patient Details  Name: Shawn Collins MRN: [id number] Date of Birth: 10-16-1950  [facility]) CM/SW Contact  Shandee Jergens A [place], LCSW Phone Number: 07/13/2023, 12:33 PM  Clinical Narrative:     CSW was informed by PT that when they were working with pt, he said he was agreeable to SNF at discharge. CSW completed SNF workup, bed offers pending. Pt is Covid +, 07/10/23, may need 10 quarantine before able to DC, depending on selected facility's rules. CSW to follow up with pt with bed offers when they are available.    TOC will continue to follow.        Expected Discharge Plan and Services                                               Social Determinants of Health (SDOH) Interventions SDOH Screenings   Food Insecurity: No Food Insecurity (07/10/2023)  Housing: Low Risk  (07/10/2023)  Transportation Needs: No Transportation Needs (07/10/2023)  Utilities: Not At Risk (07/10/2023)  Social Connections: Moderately Isolated (07/10/2023)  Tobacco Use: Medium Risk (07/10/2023)    Readmission Risk Interventions    07/07/2022    9:49 AM  Readmission Risk Prevention Plan  Transportation Screening Complete  PCP or Specialist Appt within 3-5 Days Complete  HRI or Home Care Consult Complete  Social Work Consult for Recovery Care Planning/Counseling --  Palliative Care Screening Not Applicable  Medication Review Oceanographer) Complete

## 2023-07-14 DIAGNOSIS — E872 Acidosis, unspecified: Secondary | ICD-10-CM | POA: Diagnosis not present

## 2023-07-14 DIAGNOSIS — G9341 Metabolic encephalopathy: Secondary | ICD-10-CM | POA: Diagnosis not present

## 2023-07-14 DIAGNOSIS — N186 End stage renal disease: Secondary | ICD-10-CM | POA: Diagnosis not present

## 2023-07-14 DIAGNOSIS — E162 Hypoglycemia, unspecified: Secondary | ICD-10-CM | POA: Diagnosis not present

## 2023-07-14 LAB — GLUCOSE, CAPILLARY
Glucose-Capillary: 60 mg/dL — ABNORMAL LOW (ref 70–99)
Glucose-Capillary: 91 mg/dL (ref 70–99)
Glucose-Capillary: 69 mg/dL — ABNORMAL LOW (ref 70–99)
Glucose-Capillary: 156 mg/dL — ABNORMAL HIGH (ref 70–99)
Glucose-Capillary: 142 mg/dL — ABNORMAL HIGH (ref 70–99)
Glucose-Capillary: 108 mg/dL — ABNORMAL HIGH (ref 70–99)

## 2023-07-14 LAB — RENAL FUNCTION PANEL
Albumin: 2.3 g/dL — ABNORMAL LOW (ref 3.5–5.0)
Anion gap: 14 (ref 5–15)
BUN: 55 mg/dL — ABNORMAL HIGH (ref 8–23)
CO2: 24 mmol/L (ref 22–32)
Calcium: 7.6 mg/dL — ABNORMAL LOW (ref 8.9–10.3)
Chloride: 93 mmol/L — ABNORMAL LOW (ref 98–111)
Creatinine, Ser: 10.24 mg/dL — ABNORMAL HIGH (ref 0.61–1.24)
GFR, Estimated: 5 mL/min — ABNORMAL LOW (ref >60)
Glucose, Bld: 64 mg/dL — ABNORMAL LOW (ref 70–99)
Phosphorus: 8.3 mg/dL — ABNORMAL HIGH (ref 2.5–4.6)
Potassium: 4.3 mmol/L (ref 3.5–5.1)
Sodium: 131 mmol/L — ABNORMAL LOW (ref 135–145)

## 2023-07-14 MED ORDER — HEPARIN SODIUM (PORCINE) 1000 UNIT/ML DIALYSIS
2000.0000 [IU] | INTRAMUSCULAR | Status: AC | PRN
  Administered 2023-07-15: 2000 [IU] via INTRAVENOUS_CENTRAL
  Filled 2023-07-14: qty 2

## 2023-07-14 MED ORDER — ANTICOAGULANT SODIUM CITRATE 4% (200MG/5ML) IV SOLN: 5.0000 mL | Status: DC | PRN

## 2023-07-14 MED ORDER — LIDOCAINE HCL (PF) 1 % IJ SOLN: 5.0000 mL | INTRAMUSCULAR | Status: DC | PRN

## 2023-07-14 MED ORDER — HYDROCOD POLI-CHLORPHE POLI ER 10-8 MG/5ML PO SUER
5.0000 mL | Freq: Two times a day (BID) | ORAL | Status: DC
  Administered 2023-07-14 – 2023-07-20 (×13): 5 mL via ORAL
  Filled 2023-07-14 (×13): qty 5

## 2023-07-14 MED ORDER — LIDOCAINE-PRILOCAINE 2.5-2.5 % EX CREA: 1.0000 | TOPICAL_CREAM | CUTANEOUS | Status: DC | PRN

## 2023-07-14 MED ORDER — HEPARIN SODIUM (PORCINE) 1000 UNIT/ML DIALYSIS: 1000.0000 [IU] | INTRAMUSCULAR | Status: DC | PRN

## 2023-07-14 MED ORDER — NEPRO/CARBSTEADY PO LIQD: 237.0000 mL | ORAL | Status: DC | PRN

## 2023-07-14 MED ORDER — PENTAFLUOROPROP-TETRAFLUOROETH EX AERO: 1.0000 | INHALATION_SPRAY | CUTANEOUS | Status: DC | PRN

## 2023-07-14 MED ORDER — ALUM & MAG HYDROXIDE-SIMETH 200-200-20 MG/5ML PO SUSP: 30.0000 mL | Freq: Four times a day (QID) | ORAL | Status: DC | PRN

## 2023-07-14 MED ORDER — HEPARIN SODIUM (PORCINE) 1000 UNIT/ML DIALYSIS
3000.0000 [IU] | Freq: Once | INTRAMUSCULAR | Status: AC
  Administered 2023-07-15: 3000 [IU] via INTRAVENOUS_CENTRAL
  Filled 2023-07-14: qty 3

## 2023-07-14 MED ORDER — ALTEPLASE 2 MG IJ SOLR: 2.0000 mg | Freq: Once | INTRAMUSCULAR | Status: DC | PRN

## 2023-07-14 NOTE — Plan of Care (Signed)
  Problem: Coping: Goal: Psychosocial and spiritual needs will be supported Outcome: Progressing   Problem: Respiratory: Goal: Will maintain a patent airway Outcome: Progressing   Problem: Activity: Goal: Risk for activity intolerance will decrease Outcome: Progressing   Problem: Nutrition: Goal: Adequate nutrition will be maintained Outcome: Progressing

## 2023-07-14 NOTE — Progress Notes (Signed)
Mobility Specialist: Progress Note   07/14/23 1235  Mobility  Activity Ambulated with assistance in hallway  Level of Assistance Contact guard assist, steadying assist  Assistive Device Front wheel walker  Distance Ambulated (ft) 100 ft  Activity Response Tolerated well  Mobility Referral Yes  Mobility visit 1 Mobility  Mobility Specialist Start Time (ACUTE ONLY) 1100  Mobility Specialist Stop Time (ACUTE ONLY) 1118  Mobility Specialist Time Calculation (min) (ACUTE ONLY) 18 min    Pt was agreeable to mobility session - received in bed. Cg for bed mobility d/t scrotal pain, MinA for STS, minG for ambulation for steadiness. No complaints of pain during ambulation just stated he was feeling tired. Returned to room without fault. Left in bed with all needs met, call bell in reach. Bed alarm on.   Maurene Capes Mobility Specialist Please contact via SecureChat or Rehab office at [phone]

## 2023-07-14 NOTE — TOC Progression Note (Signed)
Transition of Care [facility]) - Progression Note    Patient Details  Name: Antoneo Ghrist MRN: [id number] Date of Birth: 01-29-1951  [facility]) CM/SW Contact  Briane Birden A [place], LCSW Phone Number: 07/14/2023, 3:58 PM  Clinical Narrative:     CSW met with pt at bedside to provide bed offers with Medicare.gov ratings. PT stated they would discuss and make a decision on a facility and let CSW know. CSW contact information provided. Pt selected [facility], CSW to reach out to facility regarding bed offer. Second choice for [facility].   TOC will continue to follow.        Expected Discharge Plan and Services                                               Social Determinants of Health (SDOH) Interventions SDOH Screenings   Food Insecurity: No Food Insecurity (07/10/2023)  Housing: Low Risk  (07/10/2023)  Transportation Needs: No Transportation Needs (07/10/2023)  Utilities: Not At Risk (07/10/2023)  Social Connections: Moderately Isolated (07/10/2023)  Tobacco Use: Medium Risk (07/10/2023)    Readmission Risk Interventions    07/07/2022    9:49 AM  Readmission Risk Prevention Plan  Transportation Screening Complete  PCP or Specialist Appt within 3-5 Days Complete  HRI or Home Care Consult Complete  Social Work Consult for Recovery Care Planning/Counseling --  Palliative Care Screening Not Applicable  Medication Review Oceanographer) Complete

## 2023-07-14 NOTE — Progress Notes (Addendum)
Admit: 07/10/2023 LOS: 4  66M ESRD MWF presenting with weakness, uremia and confusion after missing dialysis for over 1 week, COVID-19 positive; hypertensive urgency  Subjective:  Seen in room, miserable still having fevers, cough blood pressures much better after initiation/starting amlodipine, hydralazine, losartan. 210/110 on admission --> 140/ 60 today No direct therapies for COVID-19  No intake/output data recorded.  Filed Weights   07/12/23 1523 07/13/23 0513 07/13/23 0514  Weight: 60 kg 64.6 kg 61.3 kg    Scheduled Meds:  amLODipine  10 mg Oral QHS   aspirin  81 mg Oral Daily   atorvastatin  40 mg Oral QHS   Chlorhexidine Gluconate Cloth  6 each Topical Q0600   chlorpheniramine-HYDROcodone  5 mL Oral Q12H   doxycycline  100 mg Oral Q12H   feeding supplement  237 mL Oral BID BM   ferrous sulfate  325 mg Oral Q breakfast   guaiFENesin  600 mg Oral BID   heparin  3,000 Units Dialysis Once in dialysis   heparin  5,000 Units Subcutaneous Q8H   hydrALAZINE  50 mg Oral Q8H   losartan  100 mg Oral Daily   polyethylene glycol  17 g Oral Daily   Continuous Infusions:  anticoagulant sodium citrate     cefTRIAXone (ROCEPHIN)  IV 1 g (07/13/23 2059)   PRN Meds:.acetaminophen **OR** acetaminophen, albuterol, alteplase, anticoagulant sodium citrate, dextrose, feeding supplement (NEPRO CARB STEADY), heparin, [START ON 07/15/2023] heparin, HYDROmorphone, lidocaine (PF), lidocaine-prilocaine, pentafluoroprop-tetrafluoroeth  Current Labs: reviewed    Physical Exam:  Blood pressure (!) 129/47, pulse 64, temperature 98 F (36.7 C), temperature source Oral, resp. rate 18, height 5\' 10"  (1.778 m), weight 61.3 kg, SpO2 97%. NAD, alert, hard to understand, pleasant, uncomfortable d/t fevers, others Regular, normal S1 and S2 Clear bilat coarse rhonchi, coughing off and on  Left arm AV graft +bruit and thrill No peripheral edema   Renal-related home meds:  - norvasc 2.5 hs - others: sl  ntg prn    Dialysis Orders: MWF [facility]  3h  B400  57kg 2/2 bath  Heparin 5000   L AVG Mircera 75 mcg q2wks - recently restarted Hectorol IV qHD - last dose 06/30/23 Sensipar 30mg  with HD - last dose 06/30/23  A Confusion/weakness: Likely uremia contributing, also COVID-19. Looks to be improving.  Uremia: after missing dialysis, resolved after HD x 2.  ESRD: on hemodialysis MWF: has had HD here 2/10 and 2/12. Next HD today.  Volume: up 4 kg, UF goal 3 L w/ HD today Chronic COVID-19 infection: no specific therapies, on room air with normal SpO2. Low grade fevers may be calming down.  Anemia of CKD: Hemoglobin 12.9 at presentation, trend CKD-BMD/secondary hyperparathyroidism: corrected calcium stable.  Phos levels labile. If remains elevated can restart binders. Metabolic acidosis: resolved w/ HD Severe hypertension w/ nonadherence: getting norvasc (2.5mg  home ^'d to 10mg  here) and losartan and hydralazine were added here. Initial BP's 210/105, now around 140/ 65. Sig improved.  Possible epididymitis - getting IV abx rocephin and doxy here per urology rec's.   P Dialysis today 3L UF PT/OT, disposition pending Medication Issues; Preferred narcotic agents for pain control are hydromorphone, fentanyl, and methadone. Morphine should not be used.  Baclofen should be avoided Avoid oral sodium phosphate and magnesium citrate based laxatives / bowel preps    Sabra Heck MD 07/14/2023, 11:38 AM  Recent Labs  Lab 07/12/23 0630 07/13/23 0636 07/14/23 0829  NA 133* 133* 131*  K 4.4 3.8 4.3  CL 93* 92* 93*  CO2 19* 24 24  GLUCOSE 77 94 64*  BUN 99* 42* 55*  CREATININE 12.84* 7.50* 10.24*  CALCIUM 7.3* 7.9* 7.6*  PHOS 9.4* 5.3* 8.3*   Recent Labs  Lab 07/10/23 1302 07/10/23 1304 07/11/23 0933 07/13/23 0808  WBC 4.8  --  5.1 6.8  NEUTROABS 3.9  --   --   --   HGB 12.2* 12.9* 11.1* 11.2*  HCT 37.1* 38.0* 32.2* 33.6*  MCV 94.2  --  89.2 90.8  PLT 166  --  161 178

## 2023-07-14 NOTE — Plan of Care (Signed)

## 2023-07-14 NOTE — Plan of Care (Signed)
  Problem: Education: Goal: Knowledge of risk factors and measures for prevention of condition will improve Outcome: Progressing   Problem: Respiratory: Goal: Complications related to the disease process, condition or treatment will be avoided or minimized Outcome: Progressing   Problem: Education: Goal: Knowledge of General Education information will improve Description: Including pain rating scale, medication(s)/side effects and non-pharmacologic comfort measures Outcome: Progressing   Problem: Health Behavior/Discharge Planning: Goal: Ability to manage health-related needs will improve Outcome: Progressing   Problem: Nutrition: Goal: Adequate nutrition will be maintained Outcome: Progressing   Problem: Pain Managment: Goal: General experience of comfort will improve and/or be controlled Outcome: Progressing

## 2023-07-14 NOTE — Progress Notes (Signed)
Progress Note   Patient: Shawn Collins ZOX:[id number] DOB: [birth year] DOA: 07/10/2023     4 DOS: the patient was seen and examined on 07/14/2023   Brief hospital course: Shawn Collins is a 73 y.o. male with medical history significant of hypertension, hyperlipidemia, CAD s/p CABG, CVA, COPD, and ESRD on HD presents with complaints of weakness and cough. He missed HD sessions as he felt weak, eating poor. EMS found him disheveled, unkempt, blood glucose low. COVID 19 tested positive admitted to hospitalist service for further management and evaluation.  Assessment and Plan: Possible epididymitis Bilateral hydrocele Still has groin pain. Continue IV rocephin per urology for 10 days.  COVID 19 infection. Continue supportive care. Encourage incentive spirometry. Continue mucinex.  ESRD on HD Nephrology on board for HD needs. Continue phosphate binders.  Anemia of chronic kidney disease. Hb stable, no active bleeding.  Acute metabolic encephalopathy- Mental status improved. He is back to baseline.  Hypoglycemia - resolved. Encourage oral diet.  Hyperkalemia - resolved. Continue ESRD.  CAD s/p CABG Continue aspirin, statin.  Severe malnutrition BMI 19. Encourage oral diet, supplements.       Out of bed to chair. Incentive spirometry. Nursing supportive care. Fall, aspiration precautions. DVT prophylaxis   Code Status: Full Code  Subjective: Patient is seen and examined today morning. He is weak, has dry cough. Did not get out if bed. Eating poor.   Physical Exam: Vitals:   07/13/23 2007 07/13/23 2342 07/14/23 0447 07/14/23 0832  BP: (!) 179/80 (!) 134/52 (!) 142/65 (!) 129/47  Pulse: 81 69 76 64  Resp: 18 18 18 18   Temp: 99.6 F (37.6 C) 99 F (37.2 C) 99.3 F (37.4 C) 98 F (36.7 C)  TempSrc: Oral Oral Oral Oral  SpO2: 99% 95% 96% 97%  Weight:      Height:        General - Elderly Caucasian ill male, no apparent distress HEENT - PERRLA, EOMI,  atraumatic head, non tender sinuses. Lung - Clear, basal rales, diffuse rhonchi, wheezes. Heart - S1, S2 heard, no murmurs, rubs, trace pedal edema. Abdomen - Soft, non tender, bowel sounds good Neuro - Alert, awake and oriented x 3, non focal exam. Skin - Warm and dry.  Data Reviewed:      Latest Ref Rng & Units 07/13/2023    8:08 AM 07/11/2023    9:33 AM 07/10/2023    1:04 PM  CBC  WBC 4.0 - 10.5 K/uL 6.8  5.1    Hemoglobin 13.0 - 17.0 g/dL 81.1  91.4  78.2   Hematocrit 39.0 - 52.0 % 33.6  32.2  38.0   Platelets 150 - 400 K/uL 178  161        Latest Ref Rng & Units 07/14/2023    8:29 AM 07/13/2023    6:36 AM 07/12/2023    6:30 AM  BMP  Glucose 70 - 99 mg/dL 64  94  77   BUN 8 - 23 mg/dL 55  42  99   Creatinine 0.61 - 1.24 mg/dL 95.62  1.30  86.57   Sodium 135 - 145 mmol/L 131  133  133   Potassium 3.5 - 5.1 mmol/L 4.3  3.8  4.4   Chloride 98 - 111 mmol/L 93  92  93   CO2 22 - 32 mmol/L 24  24  19    Calcium 8.9 - 10.3 mg/dL 7.6  7.9  7.3    No results found.   Family Communication: Discussed with patient,  he understand and agree. All questions answereed.    Disposition: Status is: Inpatient Remains inpatient appropriate because: IV antibiotics, encourage out of bed, PT, oral intake.  Planned Discharge Destination: Home with Home Health     Time spent: 39 minutes  Author: Marcelino Duster, MD 07/14/2023 2:14 PM Secure chat 7am to 7pm For on call review [email or web address].

## 2023-07-15 DIAGNOSIS — E872 Acidosis, unspecified: Secondary | ICD-10-CM | POA: Diagnosis not present

## 2023-07-15 DIAGNOSIS — G9341 Metabolic encephalopathy: Secondary | ICD-10-CM | POA: Diagnosis not present

## 2023-07-15 DIAGNOSIS — N186 End stage renal disease: Secondary | ICD-10-CM | POA: Diagnosis not present

## 2023-07-15 DIAGNOSIS — E162 Hypoglycemia, unspecified: Secondary | ICD-10-CM | POA: Diagnosis not present

## 2023-07-15 LAB — RENAL FUNCTION PANEL
Albumin: 2.3 g/dL — ABNORMAL LOW (ref 3.5–5.0)
Anion gap: 15 (ref 5–15)
BUN: 67 mg/dL — ABNORMAL HIGH (ref 8–23)
CO2: 24 mmol/L (ref 22–32)
Calcium: 7.5 mg/dL — ABNORMAL LOW (ref 8.9–10.3)
Chloride: 92 mmol/L — ABNORMAL LOW (ref 98–111)
Creatinine, Ser: 11.3 mg/dL — ABNORMAL HIGH (ref 0.61–1.24)
GFR, Estimated: 4 mL/min — ABNORMAL LOW (ref >60)
Glucose, Bld: 76 mg/dL (ref 70–99)
Phosphorus: 7.6 mg/dL — ABNORMAL HIGH (ref 2.5–4.6)
Potassium: 4.4 mmol/L (ref 3.5–5.1)
Sodium: 131 mmol/L — ABNORMAL LOW (ref 135–145)

## 2023-07-15 LAB — CBC
HCT: 29.4 % — ABNORMAL LOW (ref 39.0–52.0)
Hemoglobin: 9.9 g/dL — ABNORMAL LOW (ref 13.0–17.0)
MCH: 31.2 pg (ref 26.0–34.0)
MCHC: 33.7 g/dL (ref 30.0–36.0)
MCV: 92.7 fL (ref 80.0–100.0)
Platelets: 176 10*3/uL (ref 150–400)
RBC: 3.17 MIL/uL — ABNORMAL LOW (ref 4.22–5.81)
RDW: 14.6 % (ref 11.5–15.5)
WBC: 5.7 10*3/uL (ref 4.0–10.5)
nRBC: 0 % (ref 0.0–0.2)

## 2023-07-15 LAB — GLUCOSE, CAPILLARY
Glucose-Capillary: 71 mg/dL (ref 70–99)
Glucose-Capillary: 105 mg/dL — ABNORMAL HIGH (ref 70–99)
Glucose-Capillary: 92 mg/dL (ref 70–99)

## 2023-07-15 NOTE — Plan of Care (Signed)
  Problem: Education: Goal: Knowledge of risk factors and measures for prevention of condition will improve Outcome: Progressing   Problem: Coping: Goal: Psychosocial and spiritual needs will be supported Outcome: Progressing   Problem: Respiratory: Goal: Will maintain a patent airway Outcome: Progressing Goal: Complications related to the disease process, condition or treatment will be avoided or minimized Outcome: Progressing   Problem: Education: Goal: Knowledge of General Education information will improve Description: Including pain rating scale, medication(s)/side effects and non-pharmacologic comfort measures Outcome: Progressing   Problem: Health Behavior/Discharge Planning: Goal: Ability to manage health-related needs will improve Outcome: Progressing   Problem: Clinical Measurements: Goal: Ability to maintain clinical measurements within normal limits will improve Outcome: Progressing Goal: Will remain free from infection Outcome: Progressing Goal: Diagnostic test results will improve Outcome: Progressing Goal: Respiratory complications will improve Outcome: Progressing Goal: Cardiovascular complication will be avoided Outcome: Progressing   Problem: Activity: Goal: Risk for activity intolerance will decrease Outcome: Progressing   Problem: Nutrition: Goal: Adequate nutrition will be maintained Outcome: Progressing   Problem: Coping: Goal: Level of anxiety will decrease Outcome: Progressing   Problem: Elimination: Goal: Will not experience complications related to bowel motility Outcome: Progressing Goal: Will not experience complications related to urinary retention Outcome: Progressing   Problem: Pain Managment: Goal: General experience of comfort will improve and/or be controlled Outcome: Progressing   Problem: Safety: Goal: Ability to remain free from injury will improve Outcome: Progressing   Problem: Skin Integrity: Goal: Risk for impaired  skin integrity will decrease Outcome: Progressing   Problem: Education: Goal: Ability to describe self-care measures that may prevent or decrease complications (Diabetes Survival Skills Education) will improve Outcome: Progressing   Problem: Coping: Goal: Ability to adjust to condition or change in health will improve Outcome: Progressing   Problem: Fluid Volume: Goal: Ability to maintain a balanced intake and output will improve Outcome: Progressing   Problem: Health Behavior/Discharge Planning: Goal: Ability to identify and utilize available resources and services will improve Outcome: Progressing Goal: Ability to manage health-related needs will improve Outcome: Progressing   Problem: Metabolic: Goal: Ability to maintain appropriate glucose levels will improve Outcome: Progressing   Problem: Nutritional: Goal: Maintenance of adequate nutrition will improve Outcome: Progressing Goal: Progress toward achieving an optimal weight will improve Outcome: Progressing   Problem: Skin Integrity: Goal: Risk for impaired skin integrity will decrease Outcome: Progressing   Problem: Tissue Perfusion: Goal: Adequacy of tissue perfusion will improve Outcome: Progressing

## 2023-07-15 NOTE — Plan of Care (Signed)

## 2023-07-15 NOTE — Progress Notes (Signed)
Admit: 07/10/2023 LOS: 5  36M ESRD MWF presenting with weakness, uremia and confusion after missing dialysis for over 1 week, COVID-19 positive; hypertensive urgency  Subjective:  Seen in room, continues to feel better   02/14 0701 - 02/15 0700 In: -  Out: 100 [Urine:100]  Filed Weights   07/12/23 1523 07/13/23 0513 07/13/23 0514  Weight: 60 kg 64.6 kg 61.3 kg    Scheduled Meds:  amLODipine  10 mg Oral QHS   aspirin  81 mg Oral Daily   atorvastatin  40 mg Oral QHS   Chlorhexidine Gluconate Cloth  6 each Topical Q0600   chlorpheniramine-HYDROcodone  5 mL Oral Q12H   doxycycline  100 mg Oral Q12H   feeding supplement  237 mL Oral BID BM   ferrous sulfate  325 mg Oral Q breakfast   guaiFENesin  600 mg Oral BID   heparin  3,000 Units Dialysis Once in dialysis   heparin  5,000 Units Subcutaneous Q8H   hydrALAZINE  50 mg Oral Q8H   losartan  100 mg Oral Daily   polyethylene glycol  17 g Oral Daily   Continuous Infusions:  anticoagulant sodium citrate     cefTRIAXone (ROCEPHIN)  IV 1 g (07/14/23 2136)   PRN Meds:.acetaminophen **OR** acetaminophen, albuterol, alteplase, alum & mag hydroxide-simeth, anticoagulant sodium citrate, dextrose, feeding supplement (NEPRO CARB STEADY), heparin, heparin, HYDROmorphone, lidocaine (PF), lidocaine-prilocaine, pentafluoroprop-tetrafluoroeth  Current Labs: reviewed    Physical Exam:  Blood pressure (!) 142/59, pulse 64, temperature 98.7 F (37.1 C), resp. rate 18, height 5\' 10"  (1.778 m), weight 61.3 kg, SpO2 95%. NAD, alert, hard to understand, pleasant, uncomfortable d/t fevers, others Regular, normal S1 and S2 Clear bilat coarse rhonchi, coughing off and on  Left arm AV graft +bruit and thrill No peripheral edema   Renal-related home meds:  - norvasc 2.5 hs - others: sl ntg prn    Dialysis Orders: MWF [facility]  3h  B400  57kg 2/2 bath  Heparin 5000   L AVG Mircera 75 mcg q2wks - recently restarted Hectorol IV qHD - last  dose 06/30/23 Sensipar 30mg  with HD - last dose 06/30/23  A Confusion/weakness: Likely uremia contributing, also COVID-19. Oriented x 3 today.  Uremia: after missing dialysis, resolved after HD x 2.  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 HD rolled over to today.  Volume: 2-3 kg over today, BP's good. 2-3 L UFG today.  Chronic COVID-19 infection: no specific therapies, on room air with normal SpO2. Low grade fevers may be calming down.  Anemia of CKD: Hb 12s on admit, now down to 9-10 range. Follow.  CKD-BMD/secondary hyperparathyroidism: corrected calcium stable.  Phos levels labile. If remains elevated can restart binders. Severe hypertension w/ nonadherence: getting norvasc (2.5mg  home ^'d to 10mg  here) and losartan and hydralazine were added here. Initial BP's 210/105, now very good at 140/ 60. Possible epididymitis - getting IV abx rocephin and doxy here per urology rec's.   P HD today , rolled over from yesterday PT/OT, disposition pending Medication Issues; Preferred narcotic agents for pain control are hydromorphone, fentanyl, and methadone. Morphine should not be used.  Baclofen should be avoided Avoid oral sodium phosphate and magnesium citrate based laxatives / bowel preps    Sabra Heck MD 07/15/2023, 10:30 AM  Recent Labs  Lab 07/13/23 0636 07/14/23 0829 07/15/23 0708  NA 133* 131* 131*  K 3.8 4.3 4.4  CL 92* 93* 92*  CO2 24 24 24  GLUCOSE 94 64* 76  BUN 42* 55* 67*  CREATININE 7.50* 10.24* 11.30*  CALCIUM 7.9* 7.6* 7.5*  PHOS 5.3* 8.3* 7.6*   Recent Labs  Lab 07/10/23 1302 07/10/23 1304 07/11/23 0933 07/13/23 0808 07/15/23 0708  WBC 4.8  --  5.1 6.8 5.7  NEUTROABS 3.9  --   --   --   --   HGB 12.2*   < > 11.1* 11.2* 9.9*  HCT 37.1*   < > 32.2* 33.6* 29.4*  MCV 94.2  --  89.2 90.8 92.7  PLT 166  --  161 178 176   < > = values in this interval not displayed.

## 2023-07-15 NOTE — Progress Notes (Signed)
Received patient in bed to unit.  Alert and oriented.  Informed consent signed and in chart.   TX duration:3.25  Patient tolerated well.  Transported back to the room  Alert, without acute distress.  Hand-off given to patient's nurse.   Access used: LAVG Access issues: prolonged bleeding time post tx  Total UF removed: 2L Medication(s) given: heparin bolus   07/15/23 1355  Vitals  BP (!) 156/71  MAP (mmHg) 96  Pulse Rate 66  Oxygen Therapy  SpO2 99 %  During Treatment Monitoring  Blood Flow Rate (mL/min) 400 mL/min  Arterial Pressure (mmHg) -156.36 mmHg  Venous Pressure (mmHg) 263.22 mmHg  TMP (mmHg) -0.61 mmHg  Ultrafiltration Rate (mL/min) 860 mL/min  Dialysate Flow Rate (mL/min) 299 ml/min  Dialysate Potassium Concentration 3  Dialysate Calcium Concentration 2.5  Duration of HD Treatment -hour(s) 3.22 hour(s)  Cumulative Fluid Removed (mL) per Treatment  2971.18  HD Safety Checks Performed Yes  Intra-Hemodialysis Comments Tx completed  Dialysis Fluid Bolus Normal Saline  Bolus Amount (mL) 300 mL  Post Treatment  Dialyzer Clearance Lightly streaked  Liters Processed 78  Fluid Removed (mL) 3000 mL  Tolerated HD Treatment Yes  AVG/AVF Arterial Site Held (minutes) 10 minutes  AVG/AVF Venous Site Held (minutes) 10 minutes  Fistula / Graft Left Upper arm Arteriovenous vein graft  Placement Date/Time: 12/21/21 1103   Orientation: Left  Access Location: Upper arm  Access Type: Arteriovenous vein graft  Site Condition Other (Comment) (prolonged bleeding time post tx)  Fistula / Graft Assessment Present;Thrill;Bruit  Status Deaccessed;Flushed;Patent      Freddi Starr, RN [facility]

## 2023-07-15 NOTE — Progress Notes (Signed)
Progress Note   Patient: Shawn Collins ZOX:[id number] DOB: 12/11/1950 DOA: 07/10/2023     5 DOS: the patient was seen and examined on 07/15/2023   Brief hospital course: Shawn Collins is a 73 y.o. male with medical history significant of hypertension, hyperlipidemia, CAD s/p CABG, CVA, COPD, and ESRD on HD presents with complaints of weakness and cough. He missed HD sessions as he felt weak, eating poor. EMS found him disheveled, unkempt, blood glucose low. COVID 19 tested positive admitted to hospitalist service for further management and evaluation.  Assessment and Plan: Possible epididymitis Bilateral hydrocele Continue IV rocephin per urology for 10 days.  COVID 19 infection. Continue supportive care. Encourage incentive spirometry. Continue mucinex. Isolation precautions  ESRD on HD Nephrology on board for HD needs. Continue phosphate binders.  Anemia of chronic kidney disease. Hb stable 10-11, no active bleeding.  Acute metabolic encephalopathy- Mental status improved. He is back to baseline.  Hypoglycemia - resolved. Encourage oral diet.  Hyperkalemia - resolved. Continue ESRD.  CAD s/p CABG Continue aspirin, statin.  Severe malnutrition BMI 19. Encourage oral diet, supplements.    Out of bed to chair. Incentive spirometry. Nursing supportive care. Fall, aspiration precautions. DVT prophylaxis   Code Status: Full Code  Subjective: Patient is seen and examined today morning. He has poor energy. Eating poor.   Physical Exam: Vitals:   07/15/23 1355 07/15/23 1417 07/15/23 1423 07/15/23 1606  BP: (!) 156/71 (!) 168/72  (!) 167/70  Pulse: 66 70  70  Resp:  15  16  Temp:    98.3 F (36.8 C)  TempSrc:    Oral  SpO2: 99% 99%  96%  Weight:   56.8 kg   Height:        General - Elderly Caucasian ill male, no apparent distress HEENT - PERRLA, EOMI, atraumatic head, non tender sinuses. Lung - Clear, basal rales, diffuse rhonchi, wheezes. Heart - S1, S2  heard, no murmurs, rubs, trace pedal edema. Abdomen - Soft, non tender, bowel sounds good Neuro - Alert, awake and oriented x 3, non focal exam. Skin - Warm and dry.  Data Reviewed:      Latest Ref Rng & Units 07/15/2023    7:08 AM 07/13/2023    8:08 AM 07/11/2023    9:33 AM  CBC  WBC 4.0 - 10.5 K/uL 5.7  6.8  5.1   Hemoglobin 13.0 - 17.0 g/dL 9.9  81.1  91.4   Hematocrit 39.0 - 52.0 % 29.4  33.6  32.2   Platelets 150 - 400 K/uL 176  178  161       Latest Ref Rng & Units 07/15/2023    7:08 AM 07/14/2023    8:29 AM 07/13/2023    6:36 AM  BMP  Glucose 70 - 99 mg/dL 76  64  94   BUN 8 - 23 mg/dL 67  55  42   Creatinine 0.61 - 1.24 mg/dL 78.29  56.21  3.08   Sodium 135 - 145 mmol/L 131  131  133   Potassium 3.5 - 5.1 mmol/L 4.4  4.3  3.8   Chloride 98 - 111 mmol/L 92  93  92   CO2 22 - 32 mmol/L 24  24  24    Calcium 8.9 - 10.3 mg/dL 7.5  7.6  7.9    No results found.   Family Communication: Discussed with patient, he understand and agree. All questions answereed.  Disposition: Status is: Inpatient Remains inpatient appropriate because: IV antibiotics, encourage  out of bed, PT, oral intake.  Planned Discharge Destination: Home with Home Health     Time spent: 38 minutes  Author: Marcelino Duster, MD 07/15/2023 4:53 PM Secure chat 7am to 7pm For on call review [email or web address].

## 2023-07-16 DIAGNOSIS — G9341 Metabolic encephalopathy: Secondary | ICD-10-CM | POA: Diagnosis not present

## 2023-07-16 DIAGNOSIS — E162 Hypoglycemia, unspecified: Secondary | ICD-10-CM | POA: Diagnosis not present

## 2023-07-16 DIAGNOSIS — N186 End stage renal disease: Secondary | ICD-10-CM | POA: Diagnosis not present

## 2023-07-16 DIAGNOSIS — E872 Acidosis, unspecified: Secondary | ICD-10-CM | POA: Diagnosis not present

## 2023-07-16 LAB — RENAL FUNCTION PANEL
Albumin: 2.5 g/dL — ABNORMAL LOW (ref 3.5–5.0)
Anion gap: 17 — ABNORMAL HIGH (ref 5–15)
BUN: 41 mg/dL — ABNORMAL HIGH (ref 8–23)
CO2: 24 mmol/L (ref 22–32)
Calcium: 7.8 mg/dL — ABNORMAL LOW (ref 8.9–10.3)
Chloride: 91 mmol/L — ABNORMAL LOW (ref 98–111)
Creatinine, Ser: 9.09 mg/dL — ABNORMAL HIGH (ref 0.61–1.24)
GFR, Estimated: 6 mL/min — ABNORMAL LOW (ref >60)
Glucose, Bld: 58 mg/dL — ABNORMAL LOW (ref 70–99)
Phosphorus: 6.1 mg/dL — ABNORMAL HIGH (ref 2.5–4.6)
Potassium: 4.3 mmol/L (ref 3.5–5.1)
Sodium: 132 mmol/L — ABNORMAL LOW (ref 135–145)

## 2023-07-16 LAB — GLUCOSE, CAPILLARY
Glucose-Capillary: 75 mg/dL (ref 70–99)
Glucose-Capillary: 81 mg/dL (ref 70–99)
Glucose-Capillary: 207 mg/dL — ABNORMAL HIGH (ref 70–99)
Glucose-Capillary: 52 mg/dL — ABNORMAL LOW (ref 70–99)

## 2023-07-16 MED ORDER — CINACALCET HCL 30 MG PO TABS
30.0000 mg | ORAL_TABLET | Freq: Every day | ORAL | Status: DC
  Administered 2023-07-16 – 2023-07-19 (×4): 30 mg via ORAL
  Filled 2023-07-16 (×4): qty 1

## 2023-07-16 MED ORDER — DEXTROSE 50 % IV SOLN
25.0000 g | Freq: Once | INTRAVENOUS | Status: AC
  Administered 2023-07-16: 25 g via INTRAVENOUS
  Filled 2023-07-16: qty 50

## 2023-07-16 MED ORDER — SEVELAMER CARBONATE 800 MG PO TABS
1600.0000 mg | ORAL_TABLET | Freq: Three times a day (TID) | ORAL | Status: DC
  Administered 2023-07-16 – 2023-07-20 (×10): 1600 mg via ORAL
  Filled 2023-07-16 (×10): qty 2

## 2023-07-16 MED ORDER — CHLORHEXIDINE GLUCONATE CLOTH 2 % EX PADS
6.0000 | MEDICATED_PAD | Freq: Every day | CUTANEOUS | Status: DC
  Administered 2023-07-17 – 2023-07-19 (×2): 6 via TOPICAL

## 2023-07-16 MED ORDER — DOXERCALCIFEROL 4 MCG/2ML IV SOLN: 4.0000 ug | INTRAVENOUS | Status: DC

## 2023-07-16 NOTE — Plan of Care (Signed)
  Problem: Education: Goal: Knowledge of risk factors and measures for prevention of condition will improve Outcome: Progressing   Problem: Coping: Goal: Psychosocial and spiritual needs will be supported Outcome: Progressing   Problem: Respiratory: Goal: Will maintain a patent airway Outcome: Progressing Goal: Complications related to the disease process, condition or treatment will be avoided or minimized Outcome: Progressing   Problem: Education: Goal: Knowledge of General Education information will improve Description: Including pain rating scale, medication(s)/side effects and non-pharmacologic comfort measures Outcome: Progressing   Problem: Health Behavior/Discharge Planning: Goal: Ability to manage health-related needs will improve Outcome: Progressing   Problem: Clinical Measurements: Goal: Ability to maintain clinical measurements within normal limits will improve Outcome: Progressing Goal: Will remain free from infection Outcome: Progressing Goal: Diagnostic test results will improve Outcome: Progressing Goal: Respiratory complications will improve Outcome: Progressing Goal: Cardiovascular complication will be avoided Outcome: Progressing   Problem: Activity: Goal: Risk for activity intolerance will decrease Outcome: Progressing   Problem: Nutrition: Goal: Adequate nutrition will be maintained Outcome: Progressing   Problem: Coping: Goal: Level of anxiety will decrease Outcome: Progressing   Problem: Elimination: Goal: Will not experience complications related to bowel motility Outcome: Progressing Goal: Will not experience complications related to urinary retention Outcome: Progressing   Problem: Pain Managment: Goal: General experience of comfort will improve and/or be controlled Outcome: Progressing   Problem: Safety: Goal: Ability to remain free from injury will improve Outcome: Progressing   Problem: Skin Integrity: Goal: Risk for impaired  skin integrity will decrease Outcome: Progressing   Problem: Education: Goal: Ability to describe self-care measures that may prevent or decrease complications (Diabetes Survival Skills Education) will improve Outcome: Progressing   Problem: Coping: Goal: Ability to adjust to condition or change in health will improve Outcome: Progressing   Problem: Fluid Volume: Goal: Ability to maintain a balanced intake and output will improve Outcome: Progressing   Problem: Health Behavior/Discharge Planning: Goal: Ability to identify and utilize available resources and services will improve Outcome: Progressing Goal: Ability to manage health-related needs will improve Outcome: Progressing   Problem: Metabolic: Goal: Ability to maintain appropriate glucose levels will improve Outcome: Progressing   Problem: Nutritional: Goal: Maintenance of adequate nutrition will improve Outcome: Progressing Goal: Progress toward achieving an optimal weight will improve Outcome: Progressing   Problem: Skin Integrity: Goal: Risk for impaired skin integrity will decrease Outcome: Progressing   Problem: Tissue Perfusion: Goal: Adequacy of tissue perfusion will improve Outcome: Progressing

## 2023-07-16 NOTE — Progress Notes (Signed)
Progress Note   Patient: Shawn Collins [id number] DOB: 1951/03/17 DOA: 07/10/2023     6 DOS: the patient was seen and examined on 07/16/2023   Brief hospital course: Nahome Bublitz is a 73 y.o. male with medical history significant of hypertension, hyperlipidemia, CAD s/p CABG, CVA, COPD, and ESRD on HD presents with complaints of weakness and cough. He missed HD sessions as he felt weak, eating poor. EMS found him disheveled, unkempt, blood glucose low. COVID 19 tested positive admitted to hospitalist service for further management and evaluation.  Assessment and Plan: Possible epididymitis Bilateral hydrocele Continue IV rocephin per urology for 10 days. Pain control.   COVID 19 infection. Continue supportive care. Encourage incentive spirometry. Continue mucinex. Isolation precautions.  ESRD on HD Nephrology on board for HD needs. Continue phosphate binders.  Anemia of chronic kidney disease. Hb stable 10-11, no active bleeding.  Acute metabolic encephalopathy- Mental status improved. He is back to baseline. Encouraged out of bed to chair, PT.  Hypoglycemia - resolved. Encourage oral diet.  Hyperkalemia - resolved. Continue ESRD.  CAD s/p CABG Continue aspirin, statin.  Severe malnutrition BMI 19. Encourage oral diet, supplements.    Out of bed to chair. Incentive spirometry. Nursing supportive care. Fall, aspiration precautions. DVT prophylaxis   Code Status: Full Code  Subjective: Patient is seen and examined today morning. He feels better today. Denies any complaints. Eating fair. Advised out of bed.  Physical Exam: Vitals:   07/15/23 1606 07/15/23 2110 07/16/23 0516 07/16/23 0840  BP: (!) 167/70 (!) 163/67 (!) 157/62 (!) 160/66  Pulse: 70 70 72 74  Resp: 16 16 16 18   Temp: 98.3 F (36.8 C) 98.4 F (36.9 C) 98.9 F (37.2 C) 99.6 F (37.6 C)  TempSrc: Oral Oral Oral Oral  SpO2: 96% 95% 96% 95%  Weight:      Height:        General -  Elderly thin built Caucasian male, no apparent distress HEENT - PERRLA, EOMI, atraumatic head, non tender sinuses. Lung - Clear, basal rales, diffuse rhonchi, wheezes. Heart - S1, S2 heard, no murmurs, rubs, trace pedal edema. Abdomen - Soft, non tender, bowel sounds good Neuro - Alert, awake and oriented x 3, non focal exam. Skin - Warm and dry.  Data Reviewed:      Latest Ref Rng & Units 07/15/2023    7:08 AM 07/13/2023    8:08 AM 07/11/2023    9:33 AM  CBC  WBC 4.0 - 10.5 K/uL 5.7  6.8  5.1   Hemoglobin 13.0 - 17.0 g/dL 9.9  57.8  46.9   Hematocrit 39.0 - 52.0 % 29.4  33.6  32.2   Platelets 150 - 400 K/uL 176  178  161       Latest Ref Rng & Units 07/16/2023    7:13 AM 07/15/2023    7:08 AM 07/14/2023    8:29 AM  BMP  Glucose 70 - 99 mg/dL 58  76  64   BUN 8 - 23 mg/dL 41  67  55   Creatinine 0.61 - 1.24 mg/dL 6.29  52.84  13.24   Sodium 135 - 145 mmol/L 132  131  131   Potassium 3.5 - 5.1 mmol/L 4.3  4.4  4.3   Chloride 98 - 111 mmol/L 91  92  93   CO2 22 - 32 mmol/L 24  24  24    Calcium 8.9 - 10.3 mg/dL 7.8  7.5  7.6    No results found.  Family Communication: Discussed with patient, he understand and agree. All questions answereed.  Disposition: Status is: Inpatient Remains inpatient appropriate because: IV antibiotics, encourage out of bed, PT, oral intake.  Planned Discharge Destination: Home with Home Health     Time spent: 39 minutes  Author: Marcelino Duster, MD 07/16/2023 11:29 AM Secure chat 7am to 7pm For on call review [email or web address]

## 2023-07-16 NOTE — Progress Notes (Signed)
Mobility Specialist Progress Note:    07/16/23 0900  Mobility  Activity Ambulated with assistance in hallway  Level of Assistance Contact guard assist, steadying assist  Assistive Device Front wheel walker  Distance Ambulated (ft) 100 ft  Activity Response Tolerated well  Mobility Referral Yes  Mobility visit 1 Mobility  Mobility Specialist Start Time (ACUTE ONLY) 0912  Mobility Specialist Stop Time (ACUTE ONLY) [id number]  Mobility Specialist Time Calculation (min) (ACUTE ONLY) 12 min   Pt received in bed and agreeable. Had x1 LOB upon standing from bed, corrected w/ minA. No complaints throughout. Pt left in bed with call bell and bed alarm on.  [facility]'Vante Earlene Plater Mobility Specialist Please contact via Special educational needs teacher or Rehab office at [phone]

## 2023-07-16 NOTE — Progress Notes (Signed)
Admit: 07/10/2023 LOS: 6  50M ESRD MWF presenting with weakness, uremia and confusion after missing dialysis for over 1 week, COVID-19 positive; hypertensive urgency  Subjective:  Seen in room, continues to feel better   02/15 0701 - 02/16 0700 In: 60 [P.O.:60] Out: 3000   Filed Weights   07/13/23 0514 07/15/23 1030 07/15/23 1423  Weight: 61.3 kg 59.8 kg 56.8 kg    Scheduled Meds:  amLODipine  10 mg Oral QHS   aspirin  81 mg Oral Daily   atorvastatin  40 mg Oral QHS   Chlorhexidine Gluconate Cloth  6 each Topical Q0600   chlorpheniramine-HYDROcodone  5 mL Oral Q12H   doxycycline  100 mg Oral Q12H   feeding supplement  237 mL Oral BID BM   ferrous sulfate  325 mg Oral Q breakfast   guaiFENesin  600 mg Oral BID   heparin  5,000 Units Subcutaneous Q8H   hydrALAZINE  50 mg Oral Q8H   losartan  100 mg Oral Daily   polyethylene glycol  17 g Oral Daily   sevelamer carbonate  1,600 mg Oral TID WC   Continuous Infusions:  anticoagulant sodium citrate     cefTRIAXone (ROCEPHIN)  IV 1 g (07/15/23 2105)   PRN Meds:.acetaminophen **OR** acetaminophen, albuterol, alteplase, alum & mag hydroxide-simeth, anticoagulant sodium citrate, dextrose, feeding supplement (NEPRO CARB STEADY), heparin, HYDROmorphone, lidocaine (PF), lidocaine-prilocaine, pentafluoroprop-tetrafluoroeth  Current Labs: reviewed    Physical Exam:  Blood pressure (!) 162/67, pulse 69, temperature 98.6 F (37 C), temperature source Oral, resp. rate 16, height 5\' 10"  (1.778 m), weight 56.8 kg, SpO2 97%. NAD, alert, hard to understand, pleasant, uncomfortable d/t fevers, others Regular, normal S1 and S2 Clear bilat coarse rhonchi, coughing off and on  Left arm AV graft +bruit and thrill No peripheral edema   Renal-related home meds:  - norvasc 2.5 hs - others: sl ntg prn    Dialysis Orders: MWF [facility]  3h  B400  57kg 2/2 bath  Heparin 5000   L AVG Mircera 75 mcg q2wks - recently restarted Hectorol IV  qHD - last dose 06/30/23 Sensipar 30mg  with HD - last dose 06/30/23  A Confusion/weakness: Likely uremia contributing, also COVID-19. Oriented x 3 today. Looking a little better every day.  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 HD rolled over to today.  Volume: down to dry wt now, and looks euvolemic. Keep even w/ next HD.  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 Consider esa soon if Hb keeps lowering.  CKD-BMD/secondary hyperparathyroidism: CCa in range, will resume IV vdra and sensipar w hd.  Phos is up. Resuming renvela 2 ac tid as binder.  Severe hypertension w/ nonadherence: getting norvasc (2.5mg  home ^'d to 10mg  here) and losartan and hydralazine were added here. Initial BP's 210/105, now good at 160/ 60. Possible epididymitis - getting IV abx rocephin and doxy here per urology rec's.   P HD Monday  PT/OT, disposition pending Medication Issues; Preferred narcotic agents for pain control are hydromorphone, fentanyl, and methadone. Morphine should not be used.  Baclofen should be avoided Avoid oral sodium phosphate and magnesium citrate based laxatives or bowel preps   Vinson Moselle  MD  [facility] 07/16/2023, 3:54 PM  Recent Labs  Lab 07/13/23 0808 07/14/23 0829 07/15/23 0708 07/16/23 0713  HGB 11.2*  --  9.9*  --   ALBUMIN  --    < >  2.3* 2.5*  CALCIUM  --    < > 7.5* 7.8*  PHOS  --    < > 7.6* 6.1*  CREATININE  --    < > 11.30* 9.09*  K  --    < > 4.4 4.3   < > = values in this interval not displayed.    Inpatient medications:  amLODipine  10 mg Oral QHS   aspirin  81 mg Oral Daily   atorvastatin  40 mg Oral QHS   Chlorhexidine Gluconate Cloth  6 each Topical Q0600   chlorpheniramine-HYDROcodone  5 mL Oral Q12H   doxycycline  100 mg Oral Q12H   feeding supplement  237 mL Oral BID BM   ferrous sulfate  325 mg Oral Q breakfast   guaiFENesin  600 mg Oral BID    heparin  5,000 Units Subcutaneous Q8H   hydrALAZINE  50 mg Oral Q8H   losartan  100 mg Oral Daily   polyethylene glycol  17 g Oral Daily   sevelamer carbonate  1,600 mg Oral TID WC    anticoagulant sodium citrate     cefTRIAXone (ROCEPHIN)  IV 1 g (07/15/23 2105)   acetaminophen **OR** acetaminophen, albuterol, alteplase, alum & mag hydroxide-simeth, anticoagulant sodium citrate, dextrose, feeding supplement (NEPRO CARB STEADY), heparin, HYDROmorphone, lidocaine (PF), lidocaine-prilocaine, pentafluoroprop-tetrafluoroeth

## 2023-07-17 DIAGNOSIS — G9341 Metabolic encephalopathy: Secondary | ICD-10-CM | POA: Diagnosis not present

## 2023-07-17 DIAGNOSIS — E872 Acidosis, unspecified: Secondary | ICD-10-CM | POA: Diagnosis not present

## 2023-07-17 DIAGNOSIS — E162 Hypoglycemia, unspecified: Secondary | ICD-10-CM | POA: Diagnosis not present

## 2023-07-17 DIAGNOSIS — N186 End stage renal disease: Secondary | ICD-10-CM | POA: Diagnosis not present

## 2023-07-17 LAB — RENAL FUNCTION PANEL
Albumin: 2.5 g/dL — ABNORMAL LOW (ref 3.5–5.0)
Anion gap: 17 — ABNORMAL HIGH (ref 5–15)
BUN: 55 mg/dL — ABNORMAL HIGH (ref 8–23)
CO2: 20 mmol/L — ABNORMAL LOW (ref 22–32)
Calcium: 7.4 mg/dL — ABNORMAL LOW (ref 8.9–10.3)
Chloride: 95 mmol/L — ABNORMAL LOW (ref 98–111)
Creatinine, Ser: 10.3 mg/dL — ABNORMAL HIGH (ref 0.61–1.24)
GFR, Estimated: 5 mL/min — ABNORMAL LOW (ref >60)
Glucose, Bld: 92 mg/dL (ref 70–99)
Phosphorus: 7.6 mg/dL — ABNORMAL HIGH (ref 2.5–4.6)
Potassium: 4.5 mmol/L (ref 3.5–5.1)
Sodium: 132 mmol/L — ABNORMAL LOW (ref 135–145)

## 2023-07-17 LAB — GLUCOSE, CAPILLARY
Glucose-Capillary: 133 mg/dL — ABNORMAL HIGH (ref 70–99)
Glucose-Capillary: 134 mg/dL — ABNORMAL HIGH (ref 70–99)
Glucose-Capillary: 149 mg/dL — ABNORMAL HIGH (ref 70–99)
Glucose-Capillary: 188 mg/dL — ABNORMAL HIGH (ref 70–99)
Glucose-Capillary: 79 mg/dL (ref 70–99)
Glucose-Capillary: 85 mg/dL (ref 70–99)

## 2023-07-17 NOTE — Progress Notes (Signed)
Progress Note   Patient: Shawn Collins AVW:[id number] DOB: 31-Oct-1950 DOA: 07/10/2023     7 DOS: the patient was seen and examined on 07/17/2023   Brief hospital course: Shawn Collins is a 73 y.o. male with medical history significant of hypertension, hyperlipidemia, CAD s/p CABG, CVA, COPD, and ESRD on HD presents with complaints of weakness and cough. He missed HD sessions as he felt weak, eating poor. EMS found him disheveled, unkempt, blood glucose low. COVID 19 tested positive admitted to hospitalist service for further management and evaluation.  Assessment and Plan: Possible epididymitis Bilateral hydrocele Continue IV rocephin per urology for 10 days. Pain control.   COVID 19 infection. Continue supportive care. Encourage incentive spirometry. Continue mucinex. Isolation precautions.  ESRD on HD Nephrology on board for HD needs. Continue phosphate binders.  Anemia of chronic kidney disease. Hb stable 10-11, no active bleeding.  Acute metabolic encephalopathy- Mental status improved. He is back to baseline. Encouraged out of bed to chair, PT.  Hypoglycemia - resolved. Encourage oral diet.  Hyperkalemia - resolved. Continue ESRD.  CAD s/p CABG Continue aspirin, statin.  Severe malnutrition BMI 19. Encourage oral diet, supplements.    PT advised nursing facility, [facility] working on placement. Out of bed to chair. Incentive spirometry. Nursing supportive care. Fall, aspiration precautions. DVT prophylaxis   Code Status: Full Code  Subjective: Patient is seen and examined today morning. He is eating better, no complaints. Advised to work with PT.  Physical Exam: Vitals:   07/17/23 0016 07/17/23 0425 07/17/23 0638 07/17/23 0858  BP: (!) 160/63 (!) 157/69 (!) 163/64 (!) 165/67  Pulse: 66 65 63 66  Resp: 18 18  18   Temp: 98.2 F (36.8 C) 98.3 F (36.8 C)  98.4 F (36.9 C)  TempSrc: Oral Oral  Oral  SpO2: 95% 98%  97%  Weight:      Height:         General - Elderly thin built Caucasian male, no apparent distress HEENT - PERRLA, EOMI, atraumatic head, non tender sinuses. Lung - Clear, basal rales, diffuse rhonchi, wheezes. Heart - S1, S2 heard, no murmurs, rubs, trace pedal edema. Abdomen - Soft, non tender, bowel sounds good Neuro - Alert, awake and oriented x 3, non focal exam. Skin - Warm and dry.  Data Reviewed:      Latest Ref Rng & Units 07/15/2023    7:08 AM 07/13/2023    8:08 AM 07/11/2023    9:33 AM  CBC  WBC 4.0 - 10.5 K/uL 5.7  6.8  5.1   Hemoglobin 13.0 - 17.0 g/dL 9.9  82.9  56.2   Hematocrit 39.0 - 52.0 % 29.4  33.6  32.2   Platelets 150 - 400 K/uL 176  178  161       Latest Ref Rng & Units 07/17/2023    6:50 AM 07/16/2023    7:13 AM 07/15/2023    7:08 AM  BMP  Glucose 70 - 99 mg/dL 92  58  76   BUN 8 - 23 mg/dL 55  41  67   Creatinine 0.61 - 1.24 mg/dL 13.08  6.57  84.69   Sodium 135 - 145 mmol/L 132  132  131   Potassium 3.5 - 5.1 mmol/L 4.5  4.3  4.4   Chloride 98 - 111 mmol/L 95  91  92   CO2 22 - 32 mmol/L 20  24  24    Calcium 8.9 - 10.3 mg/dL 7.4  7.8  7.5  No results found.   Family Communication: Discussed with patient, he understand and agree. All questions answereed.  Disposition: Status is: Inpatient Remains inpatient appropriate because: IV antibiotics, encourage out of bed, PT, SNF placement  Planned Discharge Destination: Skilled nursing facility     Time spent: 37 minutes  Author: Marcelino Duster, MD 07/17/2023 1:53 PM Secure chat 7am to 7pm For on call review [email or web address].

## 2023-07-17 NOTE — Progress Notes (Signed)
Admit: 07/10/2023 LOS: 7  73M ESRD MWF presenting with weakness, uremia and confusion after missing dialysis for over 1 week, COVID-19 positive; hypertensive urgency  Subjective:  Seen in room, sitting up and alert    02/16 0701 - 02/17 0700 In: 60 [P.O.:60] Out: -   Filed Weights   07/13/23 0514 07/15/23 1030 07/15/23 1423  Weight: 61.3 kg 59.8 kg 56.8 kg    Scheduled Meds:  amLODipine  10 mg Oral QHS   aspirin  81 mg Oral Daily   atorvastatin  40 mg Oral QHS   Chlorhexidine Gluconate Cloth  6 each Topical Q0600   Chlorhexidine Gluconate Cloth  6 each Topical Q0600   chlorpheniramine-HYDROcodone  5 mL Oral Q12H   cinacalcet  30 mg Oral Q supper   doxercalciferol  4 mcg Intravenous Q M,W,F-HD   doxycycline  100 mg Oral Q12H   feeding supplement  237 mL Oral BID BM   ferrous sulfate  325 mg Oral Q breakfast   guaiFENesin  600 mg Oral BID   heparin  5,000 Units Subcutaneous Q8H   hydrALAZINE  50 mg Oral Q8H   losartan  100 mg Oral Daily   polyethylene glycol  17 g Oral Daily   sevelamer carbonate  1,600 mg Oral TID WC   Continuous Infusions:  anticoagulant sodium citrate     cefTRIAXone (ROCEPHIN)  IV 1 g (07/16/23 2143)   PRN Meds:.acetaminophen **OR** acetaminophen, albuterol, alteplase, alum & mag hydroxide-simeth, anticoagulant sodium citrate, dextrose, feeding supplement (NEPRO CARB STEADY), heparin, HYDROmorphone, lidocaine (PF), lidocaine-prilocaine, pentafluoroprop-tetrafluoroeth  Current Labs: reviewed    Physical Exam:  Blood pressure (!) 165/67, pulse 66, temperature 98.4 F (36.9 C), temperature source Oral, resp. rate 18, height 5\' 10"  (1.778 m), weight 56.8 kg, SpO2 97%. NAD, alert, hard to understand, pleasant, uncomfortable d/t fevers, others Regular, normal S1 and S2 Clear bilat coarse rhonchi, coughing off and on  Left arm AV graft +bruit and thrill No peripheral edema   Renal-related home meds:  - norvasc 2.5 hs - others: sl ntg prn     Dialysis Orders: MWF [facility]  3h  B400  57kg 2/2 bath  Heparin 5000   L AVG Mircera 75 mcg q2wks - recently restarted Hectorol IV qHD - last dose 06/30/23 Sensipar 30mg  with HD - last dose 06/30/23  A Confusion/weakness: Likely uremia contributing, also COVID-19. Oriented x 3 today. Looking close to normal today.  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 On schedule for HD today/ tonight.  Volume: down to dry wt now, and looks euvolemic. Keep even w/ next HD.  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 Consider esa soon if Hb keeps lowering.  CKD-BMD/secondary hyperparathyroidism: CCa in range, will resume IV vdra and sensipar w hd.  Phos is up. Resuming renvela 2 ac tid as binder.  Severe hypertension w/ nonadherence: getting norvasc (2.5mg  home ^'d to 10mg  here) and losartan and hydralazine were added here. BP's good.  Possible epididymitis - getting IV abx rocephin and doxy here per urology rec's.   P HD Monday  Awaiting SNF placement  Medication Issues; Preferred narcotic agents for pain control are hydromorphone, fentanyl, and methadone. Morphine should not be used.  Baclofen should be avoided Avoid oral sodium phosphate and magnesium citrate based laxatives or bowel preps   Vinson Moselle  MD  [facility] 07/17/2023, 2:55 PM  Recent Labs  Lab 07/13/23 [id number]  07/14/23 0829 07/15/23 0708 07/16/23 0713 07/17/23 0650  HGB 11.2*  --  9.9*  --   --   ALBUMIN  --    < > 2.3* 2.5* 2.5*  CALCIUM  --    < > 7.5* 7.8* 7.4*  PHOS  --    < > 7.6* 6.1* 7.6*  CREATININE  --    < > 11.30* 9.09* 10.30*  K  --    < > 4.4 4.3 4.5   < > = values in this interval not displayed.    Inpatient medications:  amLODipine  10 mg Oral QHS   aspirin  81 mg Oral Daily   atorvastatin  40 mg Oral QHS   Chlorhexidine Gluconate Cloth  6 each Topical Q0600   Chlorhexidine Gluconate Cloth  6 each Topical Q0600    chlorpheniramine-HYDROcodone  5 mL Oral Q12H   cinacalcet  30 mg Oral Q supper   doxercalciferol  4 mcg Intravenous Q M,W,F-HD   doxycycline  100 mg Oral Q12H   feeding supplement  237 mL Oral BID BM   ferrous sulfate  325 mg Oral Q breakfast   guaiFENesin  600 mg Oral BID   heparin  5,000 Units Subcutaneous Q8H   hydrALAZINE  50 mg Oral Q8H   losartan  100 mg Oral Daily   polyethylene glycol  17 g Oral Daily   sevelamer carbonate  1,600 mg Oral TID WC    anticoagulant sodium citrate     cefTRIAXone (ROCEPHIN)  IV 1 g (07/16/23 2143)   acetaminophen **OR** acetaminophen, albuterol, alteplase, alum & mag hydroxide-simeth, anticoagulant sodium citrate, dextrose, feeding supplement (NEPRO CARB STEADY), heparin, HYDROmorphone, lidocaine (PF), lidocaine-prilocaine, pentafluoroprop-tetrafluoroeth

## 2023-07-17 NOTE — TOC Progression Note (Signed)
Transition of Care [facility]) - Progression Note    Patient Details  Name: Shawn Collins MRN: [id number] Date of Birth: 1950/07/24  Transition of [facility]) CM/SW Contact  Kaymen Adrian A [place], LCSW Phone Number: 07/17/2023, 4:32 PM  Clinical Narrative:     Update 07/19/23 1630 CSW was contacted by pt's niece, she stated that family and pt wanted to change placement back to [facility]. CSW reached out to [facility], bed is available at facility tomorrow. Pt estimated DC tomorrow.    CSW met with pt at bedside to discuss bed offers. CSW followed up with [facility], they are unable to provide transportation to HD until Friday.   CSW reached out to [facility], they can provide transportation for pt to outpatient HD facility. However, cannot take pt until Thursday 2/20, due to 10day quarantine rule. EDD Thursday.    TOC will continue to follow.        Expected Discharge Plan and Services                                               Social Determinants of Health (SDOH) Interventions SDOH Screenings   Food Insecurity: No Food Insecurity (07/10/2023)  Housing: Low Risk  (07/10/2023)  Transportation Needs: No Transportation Needs (07/10/2023)  Utilities: Not At Risk (07/10/2023)  Social Connections: Moderately Isolated (07/10/2023)  Tobacco Use: Medium Risk (07/10/2023)    Readmission Risk Interventions    07/07/2022    9:49 AM  Readmission Risk Prevention Plan  Transportation Screening Complete  PCP or Specialist Appt within 3-5 Days Complete  HRI or Home Care Consult Complete  Social Work Consult for Recovery Care Planning/Counseling --  Palliative Care Screening Not Applicable  Medication Review [employer]) Complete

## 2023-07-17 NOTE — Progress Notes (Signed)
Physical Therapy Treatment Patient Details Name: Shawn Collins MRN: [id number] DOB: [birth year] Today's Date: 07/17/2023   History of Present Illness 10M  presenting 2/10 with weakness, uremia and confusion after missing dialysis for over 1 week, found to be COVID-19 positive; hypertensive urgency. PMHx: hypertension, hyperlipidemia, CAD s/p CABG, CVA, COPD, ESRD on HD MWF.    PT Comments  Gradual progression towards acute rehab goals. Required min assist to stand from bed, holding RW for support, had one episode of Lt knee buckling while ambulating required min assist to stabilize/correct. Difficulty initiating, requires multimodal cues at times. SpO2 96% and greater on RA during session. Eager to mobilize OOB. Up in recliner with alarm on. Patient will continue to benefit from skilled physical therapy services to further improve independence with functional mobility. Patient will benefit from continued inpatient follow up therapy, <3 hours/day     If plan is discharge home, recommend the following: A little help with walking and/or transfers;A little help with bathing/dressing/bathroom;Direct supervision/assist for medications management;Assist for transportation;Direct supervision/assist for financial management;Help with stairs or ramp for entrance;Supervision due to cognitive status;Assistance with cooking/housework   Can travel by private vehicle     Yes  Equipment Recommendations  None recommended by PT    Recommendations for Other Services       Precautions / Restrictions Precautions Precautions: Fall Recall of Precautions/Restrictions: Impaired Restrictions Weight Bearing Restrictions Per Provider Order: No     Mobility  Bed Mobility Overal bed mobility: Needs Assistance Bed Mobility: Supine to Sit     Supine to sit: Contact guard     General bed mobility comments: CGA to rise, cues for sequencing to EOB. Extra time and effort.    Transfers Overall transfer level:  Needs assistance Equipment used: Rolling walker (2 wheels) Transfers: Sit to/from Stand Sit to Stand: Min assist           General transfer comment: Min assist for boost and balance, leaning slightly to posterior. RW to stabilize. Cues to shift weight forward.    Ambulation/Gait Ambulation/Gait assistance: Min assist Gait Distance (Feet): 60 Feet Assistive device: Rolling walker (2 wheels) Gait Pattern/deviations: Step-through pattern, Decreased stride length, Steppage, Decreased dorsiflexion - right, Decreased dorsiflexion - left, Drifts right/left, Trunk flexed, Knees buckling Gait velocity: dec Gait velocity interpretation: <1.31 ft/sec, indicative of household ambulator   General Gait Details: Cues for upright posture and proximity to walker to maximize support and safety. Pt had one episode of Lt knee buckle during turn in room. Required min assist to stabilize, otherwise CGA majority of distance. SpO2 96% on RA.   Stairs             Wheelchair Mobility     Tilt Bed    Modified Rankin (Stroke Patients Only)       Balance Overall balance assessment: Needs assistance Sitting-balance support: Feet supported, No upper extremity supported Sitting balance-Leahy Scale: Fair     Standing balance support: Bilateral upper extremity supported, During functional activity, Reliant on assistive device for balance Standing balance-Leahy Scale: Poor Standing balance comment: CGA with hands on RW. Posterior sway                            Communication Communication Communication: No apparent difficulties  Cognition Arousal: Alert Behavior During Therapy: Flat affect   PT - Cognitive impairments: No family/caregiver present to determine baseline, Sequencing, Problem solving, Safety/Judgement, Initiation  PT - Cognition Comments: Requires cues to facilitate. Following commands: Intact Following commands impaired: Only follows  one step commands consistently, Follows one step commands with increased time, Follows multi-step commands inconsistently    Cueing Cueing Techniques: Verbal cues, Gestural cues, Tactile cues  Exercises General Exercises - Lower Extremity Ankle Circles/Pumps: AROM, Both, 15 reps, Seated Quad Sets: Strengthening, Both, 10 reps, Seated Gluteal Sets: Strengthening, Both, 10 reps, Seated Long Arc Quad: Strengthening, Both, 20 reps, Seated    General Comments General comments (skin integrity, edema, etc.): SpO2 96% and greater on RA during session. No dyspnea noted with activity. LEs fatigued at end of ambulatory distance.      Pertinent Vitals/Pain Pain Assessment Pain Assessment: No/denies pain    Home Living                          Prior Function            PT Goals (current goals can now be found in the care plan section) Acute Rehab PT Goals Patient Stated Goal: Get well PT Goal Formulation: With patient Time For Goal Achievement: 07/25/23 Potential to Achieve Goals: Good Progress towards PT goals: Progressing toward goals    Frequency    Min 1X/week      PT Plan      Co-evaluation              AM-PAC PT "6 Clicks" Mobility   Outcome Measure  Help needed turning from your back to your side while in a flat bed without using bedrails?: A Little Help needed moving from lying on your back to sitting on the side of a flat bed without using bedrails?: A Little Help needed moving to and from a bed to a chair (including a wheelchair)?: A Little Help needed standing up from a chair using your arms (e.g., wheelchair or bedside chair)?: A Little Help needed to walk in hospital room?: A Little Help needed climbing 3-5 steps with a railing? : A Lot 6 Click Score: 17    End of Session Equipment Utilized During Treatment: Gait belt Activity Tolerance: Patient tolerated treatment well Patient left: with call bell/phone within reach;in chair;with chair alarm  set Nurse Communication: Mobility status PT Visit Diagnosis: Unsteadiness on feet (R26.81);Other abnormalities of gait and mobility (R26.89);Muscle weakness (generalized) (M62.81);Other symptoms and signs involving the nervous system (R29.898)     Time: 1610-9604 PT Time Calculation (min) (ACUTE ONLY): 23 min  Charges:    $Gait Training: 8-22 mins $Therapeutic Activity: 8-22 mins PT General Charges $$ ACUTE PT VISIT: 1 Visit                     Kathlyn Sacramento, PT, DPT [facility]  Rehabilitation Services Physical Therapist Office: [phone] Website: [email or web address]    Berton Mount 07/17/2023, 12:28 PM

## 2023-07-17 NOTE — Plan of Care (Signed)
  Problem: Education: Goal: Knowledge of risk factors and measures for prevention of condition will improve Outcome: Progressing   Problem: Coping: Goal: Psychosocial and spiritual needs will be supported Outcome: Progressing   Problem: Respiratory: Goal: Will maintain a patent airway Outcome: Progressing Goal: Complications related to the disease process, condition or treatment will be avoided or minimized Outcome: Progressing   Problem: Education: Goal: Knowledge of General Education information will improve Description: Including pain rating scale, medication(s)/side effects and non-pharmacologic comfort measures Outcome: Progressing   Problem: Health Behavior/Discharge Planning: Goal: Ability to manage health-related needs will improve Outcome: Progressing   Problem: Clinical Measurements: Goal: Ability to maintain clinical measurements within normal limits will improve Outcome: Progressing Goal: Will remain free from infection Outcome: Progressing Goal: Diagnostic test results will improve Outcome: Progressing Goal: Respiratory complications will improve Outcome: Progressing Goal: Cardiovascular complication will be avoided Outcome: Progressing   Problem: Activity: Goal: Risk for activity intolerance will decrease Outcome: Progressing   Problem: Nutrition: Goal: Adequate nutrition will be maintained Outcome: Progressing   Problem: Coping: Goal: Level of anxiety will decrease Outcome: Progressing   Problem: Elimination: Goal: Will not experience complications related to bowel motility Outcome: Progressing Goal: Will not experience complications related to urinary retention Outcome: Progressing   Problem: Pain Managment: Goal: General experience of comfort will improve and/or be controlled Outcome: Progressing   Problem: Safety: Goal: Ability to remain free from injury will improve Outcome: Progressing   Problem: Skin Integrity: Goal: Risk for impaired  skin integrity will decrease Outcome: Progressing   Problem: Education: Goal: Ability to describe self-care measures that may prevent or decrease complications (Diabetes Survival Skills Education) will improve Outcome: Progressing   Problem: Coping: Goal: Ability to adjust to condition or change in health will improve Outcome: Progressing   Problem: Fluid Volume: Goal: Ability to maintain a balanced intake and output will improve Outcome: Progressing   Problem: Health Behavior/Discharge Planning: Goal: Ability to identify and utilize available resources and services will improve Outcome: Progressing Goal: Ability to manage health-related needs will improve Outcome: Progressing   Problem: Metabolic: Goal: Ability to maintain appropriate glucose levels will improve Outcome: Progressing   Problem: Nutritional: Goal: Maintenance of adequate nutrition will improve Outcome: Progressing Goal: Progress toward achieving an optimal weight will improve Outcome: Progressing   Problem: Skin Integrity: Goal: Risk for impaired skin integrity will decrease Outcome: Progressing   Problem: Tissue Perfusion: Goal: Adequacy of tissue perfusion will improve Outcome: Progressing

## 2023-07-17 NOTE — Progress Notes (Signed)
Hypoglycemic Event  CBG: 52  Treatment: D50 50 mL (25 gm)  Symptoms: None  Follow-up CBG: Time:2125 CBG Result:207  Possible Reasons for Event: Inadequate meal intake  Comments/MD notified:Dr Joanette Gula notified of event    Elmore Guise

## 2023-07-18 DIAGNOSIS — E162 Hypoglycemia, unspecified: Secondary | ICD-10-CM | POA: Diagnosis not present

## 2023-07-18 DIAGNOSIS — G9341 Metabolic encephalopathy: Secondary | ICD-10-CM | POA: Diagnosis not present

## 2023-07-18 DIAGNOSIS — N186 End stage renal disease: Secondary | ICD-10-CM | POA: Diagnosis not present

## 2023-07-18 DIAGNOSIS — E872 Acidosis, unspecified: Secondary | ICD-10-CM | POA: Diagnosis not present

## 2023-07-18 LAB — CBC
HCT: 32.2 % — ABNORMAL LOW (ref 39.0–52.0)
Hemoglobin: 10.5 g/dL — ABNORMAL LOW (ref 13.0–17.0)
MCH: 30.2 pg (ref 26.0–34.0)
MCHC: 32.6 g/dL (ref 30.0–36.0)
MCV: 92.5 fL (ref 80.0–100.0)
Platelets: 189 10*3/uL (ref 150–400)
RBC: 3.48 MIL/uL — ABNORMAL LOW (ref 4.22–5.81)
RDW: 14.1 % (ref 11.5–15.5)
WBC: 4.5 10*3/uL (ref 4.0–10.5)
nRBC: 0 % (ref 0.0–0.2)

## 2023-07-18 LAB — GLUCOSE, CAPILLARY
Glucose-Capillary: 110 mg/dL — ABNORMAL HIGH (ref 70–99)
Glucose-Capillary: 131 mg/dL — ABNORMAL HIGH (ref 70–99)
Glucose-Capillary: 143 mg/dL — ABNORMAL HIGH (ref 70–99)
Glucose-Capillary: 148 mg/dL — ABNORMAL HIGH (ref 70–99)
Glucose-Capillary: 87 mg/dL (ref 70–99)
Glucose-Capillary: 98 mg/dL (ref 70–99)

## 2023-07-18 LAB — RENAL FUNCTION PANEL
Albumin: 2.5 g/dL — ABNORMAL LOW (ref 3.5–5.0)
Anion gap: 14 (ref 5–15)
BUN: 59 mg/dL — ABNORMAL HIGH (ref 8–23)
CO2: 21 mmol/L — ABNORMAL LOW (ref 22–32)
Calcium: 7 mg/dL — ABNORMAL LOW (ref 8.9–10.3)
Chloride: 93 mmol/L — ABNORMAL LOW (ref 98–111)
Creatinine, Ser: 12.11 mg/dL — ABNORMAL HIGH (ref 0.61–1.24)
GFR, Estimated: 4 mL/min — ABNORMAL LOW (ref >60)
Glucose, Bld: 87 mg/dL (ref 70–99)
Phosphorus: 7.3 mg/dL — ABNORMAL HIGH (ref 2.5–4.6)
Potassium: 4.7 mmol/L (ref 3.5–5.1)
Sodium: 128 mmol/L — ABNORMAL LOW (ref 135–145)

## 2023-07-18 MED ORDER — HEPARIN SODIUM (PORCINE) 1000 UNIT/ML DIALYSIS
2000.0000 [IU] | INTRAMUSCULAR | Status: AC | PRN
  Administered 2023-07-20: 2000 [IU] via INTRAVENOUS_CENTRAL
  Filled 2023-07-18: qty 2

## 2023-07-18 MED ORDER — HEPARIN SODIUM (PORCINE) 1000 UNIT/ML IJ SOLN
INTRAMUSCULAR | Status: AC
  Administered 2023-07-18: 3000 [IU] via INTRAVENOUS_CENTRAL
  Filled 2023-07-18: qty 5

## 2023-07-18 MED ORDER — HEPARIN SODIUM (PORCINE) 1000 UNIT/ML DIALYSIS: 3000.0000 [IU] | Freq: Once | INTRAMUSCULAR | Status: AC

## 2023-07-18 NOTE — Plan of Care (Signed)
  Problem: Education: Goal: Knowledge of risk factors and measures for prevention of condition will improve Outcome: Progressing   Problem: Coping: Goal: Psychosocial and spiritual needs will be supported Outcome: Progressing   Problem: Respiratory: Goal: Will maintain a patent airway Outcome: Progressing Goal: Complications related to the disease process, condition or treatment will be avoided or minimized Outcome: Progressing   Problem: Education: Goal: Knowledge of General Education information will improve Description: Including pain rating scale, medication(s)/side effects and non-pharmacologic comfort measures Outcome: Progressing   Problem: Health Behavior/Discharge Planning: Goal: Ability to manage health-related needs will improve Outcome: Progressing   Problem: Clinical Measurements: Goal: Ability to maintain clinical measurements within normal limits will improve Outcome: Progressing Goal: Will remain free from infection Outcome: Progressing Goal: Diagnostic test results will improve Outcome: Progressing Goal: Respiratory complications will improve Outcome: Progressing Goal: Cardiovascular complication will be avoided Outcome: Progressing   Problem: Activity: Goal: Risk for activity intolerance will decrease Outcome: Progressing   Problem: Nutrition: Goal: Adequate nutrition will be maintained Outcome: Progressing   Problem: Coping: Goal: Level of anxiety will decrease Outcome: Progressing   Problem: Elimination: Goal: Will not experience complications related to bowel motility Outcome: Progressing Goal: Will not experience complications related to urinary retention Outcome: Progressing   Problem: Pain Managment: Goal: General experience of comfort will improve and/or be controlled Outcome: Progressing   Problem: Safety: Goal: Ability to remain free from injury will improve Outcome: Progressing   Problem: Skin Integrity: Goal: Risk for impaired  skin integrity will decrease Outcome: Progressing   Problem: Education: Goal: Ability to describe self-care measures that may prevent or decrease complications (Diabetes Survival Skills Education) will improve Outcome: Progressing   Problem: Coping: Goal: Ability to adjust to condition or change in health will improve Outcome: Progressing   Problem: Fluid Volume: Goal: Ability to maintain a balanced intake and output will improve Outcome: Progressing   Problem: Health Behavior/Discharge Planning: Goal: Ability to identify and utilize available resources and services will improve Outcome: Progressing Goal: Ability to manage health-related needs will improve Outcome: Progressing   Problem: Metabolic: Goal: Ability to maintain appropriate glucose levels will improve Outcome: Progressing   Problem: Nutritional: Goal: Maintenance of adequate nutrition will improve Outcome: Progressing Goal: Progress toward achieving an optimal weight will improve Outcome: Progressing   Problem: Skin Integrity: Goal: Risk for impaired skin integrity will decrease Outcome: Progressing   Problem: Tissue Perfusion: Goal: Adequacy of tissue perfusion will improve Outcome: Progressing

## 2023-07-18 NOTE — Progress Notes (Signed)
Received patient in bed to unit.  Alert and oriented.  Informed consent signed and in chart.   TX duration:3.5  Patient tolerated well.  Transported back to the room  Alert, without acute distress.  Hand-off given to patient's nurse.   Access used: LAVG Access issues: prolonged venous bleeding time  Total UF removed: 500cc Medication(s) given: heparin bolus   07/18/23 1723  Vitals  Temp 98.5 F (36.9 C)  BP (!) 184/80  MAP (mmHg) 111  ECG Heart Rate 77  During Treatment Monitoring  Blood Flow Rate (mL/min) 0 mL/min  Arterial Pressure (mmHg) 32.12 mmHg  Venous Pressure (mmHg) -31.11 mmHg  TMP (mmHg) 18.99 mmHg  Ultrafiltration Rate (mL/min) 294 mL/min  Dialysate Flow Rate (mL/min) 300 ml/min  Duration of HD Treatment -hour(s) 3.5 hour(s)  Cumulative Fluid Removed (mL) per Treatment  500.08  HD Safety Checks Performed Yes  Intra-Hemodialysis Comments Tx completed  Post Treatment  Dialyzer Clearance Lightly streaked  Liters Processed 84  Fluid Removed (mL) 500 mL  Tolerated HD Treatment Yes  AVG/AVF Arterial Site Held (minutes) 8 minutes  AVG/AVF Venous Site Held (minutes) 45 minutes  Fistula / Graft Left Upper arm Arteriovenous vein graft  Placement Date/Time: 12/21/21 1103   Orientation: Left  Access Location: Upper arm  Access Type: Arteriovenous vein graft  Site Condition No complications  Fistula / Graft Assessment Present;Thrill;Bruit  Status Deaccessed (dressing c/d/i)      Freddi Starr, RN [facility]

## 2023-07-18 NOTE — Progress Notes (Signed)
Admit: 07/10/2023 LOS: 8  14M ESRD MWF presenting with weakness, uremia and confusion after missing dialysis for over 1 week, COVID-19 positive; hypertensive urgency  Subjective:  Seen in room, sitting up and alert   02/17 0701 - 02/18 0700 In: -  Out: 110 [Urine:110]  Filed Weights   07/15/23 1030 07/15/23 1423 07/18/23 1303  Weight: 59.8 kg 56.8 kg 57.2 kg    Scheduled Meds:  amLODipine  10 mg Oral QHS   aspirin  81 mg Oral Daily   atorvastatin  40 mg Oral QHS   Chlorhexidine Gluconate Cloth  6 each Topical Q0600   Chlorhexidine Gluconate Cloth  6 each Topical Q0600   chlorpheniramine-HYDROcodone  5 mL Oral Q12H   cinacalcet  30 mg Oral Q supper   doxercalciferol  4 mcg Intravenous Q M,W,F-HD   doxycycline  100 mg Oral Q12H   feeding supplement  237 mL Oral BID BM   ferrous sulfate  325 mg Oral Q breakfast   guaiFENesin  600 mg Oral BID   heparin  5,000 Units Subcutaneous Q8H   hydrALAZINE  50 mg Oral Q8H   losartan  100 mg Oral Daily   polyethylene glycol  17 g Oral Daily   sevelamer carbonate  1,600 mg Oral TID WC   Continuous Infusions:  anticoagulant sodium citrate     cefTRIAXone (ROCEPHIN)  IV 1 g (07/17/23 2136)   PRN Meds:.acetaminophen **OR** acetaminophen, albuterol, alteplase, alum & mag hydroxide-simeth, anticoagulant sodium citrate, dextrose, feeding supplement (NEPRO CARB STEADY), heparin, [START ON 07/19/2023] heparin, HYDROmorphone, lidocaine (PF), lidocaine-prilocaine, pentafluoroprop-tetrafluoroeth  Current Labs: reviewed    Physical Exam:  Blood pressure (!) 188/75, pulse 65, temperature 98.4 F (36.9 C), temperature source Oral, resp. rate 17, height 5\' 10"  (1.778 m), weight 57.2 kg, SpO2 99%. NAD, alert, hard to understand, pleasant, uncomfortable d/t fevers, others Regular, normal S1 and S2 Clear bilat coarse rhonchi, coughing off and on  Left arm AV graft +bruit and thrill No peripheral edema   Renal-related home meds:  - norvasc 2.5 hs -  others: sl ntg prn    Dialysis Orders: MWF [facility]  3h  B400  57kg 2/2 bath  Heparin 5000   L AVG Mircera 75 mcg q2wks - recently restarted Hectorol IV qHD - last dose 06/30/23 Sensipar 30mg  with HD - last dose 06/30/23  A Confusion/weakness: Likely uremia contributing, also COVID-19. Oriented x 3 today. Looking close to normal today.  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 Getting HD today off schedule.  Volume: down to dry wt now, and looks euvolemic. Keep even w/ HD today.  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 Consider esa soon if Hb drops again CKD-BMD/secondary hyperparathyroidism: CCa in range, cont IV vdra and sensipar w hd.  Phos is high, cont binders w/ meals.  Severe hypertension w/ nonadherence: getting norvasc (higher dose here) and 2 new meds losartan and hydralazine. BP's stable. Possible epididymitis - getting IV abx rocephin and doxy here per urology rec's.   P HD today off schedule. Keep him on TTS schedule this week Comes off precautions on Thursday Awaiting SNF placement  Medication Issues; Preferred narcotic agents for pain control are hydromorphone, fentanyl, and methadone. Morphine should not be used.  Baclofen should be avoided Avoid oral sodium phosphate and magnesium citrate based laxatives or bowel preps   Vinson Moselle  MD  [facility] 07/18/2023, 4:17 PM  Recent Labs  Lab 07/15/23 0708 07/16/23 0713 07/17/23 0650 07/18/23 0634 07/18/23 0839  HGB 9.9*  --   --   --  10.5*  ALBUMIN 2.3*   < > 2.5* 2.5*  --   CALCIUM 7.5*   < > 7.4* 7.0*  --   PHOS 7.6*   < > 7.6* 7.3*  --   CREATININE 11.30*   < > 10.30* 12.11*  --   K 4.4   < > 4.5 4.7  --    < > = values in this interval not displayed.    Inpatient medications:  amLODipine  10 mg Oral QHS   aspirin  81 mg Oral Daily   atorvastatin  40 mg Oral QHS   Chlorhexidine Gluconate Cloth  6 each Topical Q0600    Chlorhexidine Gluconate Cloth  6 each Topical Q0600   chlorpheniramine-HYDROcodone  5 mL Oral Q12H   cinacalcet  30 mg Oral Q supper   doxercalciferol  4 mcg Intravenous Q M,W,F-HD   doxycycline  100 mg Oral Q12H   feeding supplement  237 mL Oral BID BM   ferrous sulfate  325 mg Oral Q breakfast   guaiFENesin  600 mg Oral BID   heparin  5,000 Units Subcutaneous Q8H   hydrALAZINE  50 mg Oral Q8H   losartan  100 mg Oral Daily   polyethylene glycol  17 g Oral Daily   sevelamer carbonate  1,600 mg Oral TID WC    anticoagulant sodium citrate     cefTRIAXone (ROCEPHIN)  IV 1 g (07/17/23 2136)   acetaminophen **OR** acetaminophen, albuterol, alteplase, alum & mag hydroxide-simeth, anticoagulant sodium citrate, dextrose, feeding supplement (NEPRO CARB STEADY), heparin, [START ON 07/19/2023] heparin, HYDROmorphone, lidocaine (PF), lidocaine-prilocaine, pentafluoroprop-tetrafluoroeth

## 2023-07-18 NOTE — Progress Notes (Signed)
Mobility Specialist: Progress Note   07/18/23 1223  Mobility  Activity Ambulated with assistance in hallway  Level of Assistance Contact guard assist, steadying assist  Assistive Device Front wheel walker  Distance Ambulated (ft) 70 ft  Activity Response Tolerated well  Mobility Referral Yes  Mobility visit 1 Mobility  Mobility Specialist Start Time (ACUTE ONLY) 1009  Mobility Specialist Stop Time (ACUTE ONLY) 1029  Mobility Specialist Time Calculation (min) (ACUTE ONLY) 20 min    Pt was agreeable to mobility session - received in bed. SV for bed mobility, minA for STS. MinG for ambulation d/t unsteadiness but no overt LOB. Required min verbal and tactile cues for steering RW and RW proximity. Returned to room without fault. Left in bed with all needs met, call bell in reach. Bed alarm on.   Maurene Capes Mobility Specialist Please contact via SecureChat or Rehab office at [phone]

## 2023-07-18 NOTE — Progress Notes (Signed)
PT Cancellation Note  Patient Details Name: Shawn Collins MRN: [id number] DOB: 1951/04/30   Cancelled Treatment:    Reason Eval/Treat Not Completed: Patient at procedure or test/unavailable  Patient starting bedside dialysis. Will attempt next date.    Jerolyn Center, PT Acute Rehabilitation Services  Office [phone]  Zena Amos 07/18/2023, 1:35 PM

## 2023-07-18 NOTE — Progress Notes (Signed)
Progress Note   Patient: Shawn Collins ONG:[id number] DOB: 1951-02-26 DOA: 07/10/2023     8 DOS: the patient was seen and examined on 07/18/2023   Brief hospital course: Shawn Collins is a 73 y.o. male with medical history significant of hypertension, hyperlipidemia, CAD s/p CABG, CVA, COPD, and ESRD on HD presents with complaints of weakness and cough. He missed HD sessions as he felt weak, eating poor. EMS found him disheveled, unkempt, blood glucose low. COVID 19 tested positive admitted to hospitalist service for further management and evaluation.  Assessment and Plan: Possible epididymitis Bilateral hydrocele Continue IV rocephin and doxy per urology for 10 days. Pain control.   COVID 19 infection. Continue supportive care. Encourage incentive spirometry. Continue antitussive meds, he is not hypoxic. Isolation precautions can be discontinued 07/20/23.  ESRD on HD Nephrology on board for HD needs. Continue phosphate binders.  Anemia of chronic kidney disease. Hb stable 10-11, no active bleeding.  Acute metabolic encephalopathy- Mental status improved. He is back to baseline. Encouraged out of bed to chair, PT. He is awaiting SNF placement.  Hypoglycemia - resolved. Encourage oral diet.  Hyperkalemia - resolved. Continue ESRD.  CAD s/p CABG Continue aspirin, statin.  Severe malnutrition BMI 19. Encourage oral diet, supplements.    PT advised nursing facility, [facility] working on placement. Out of bed to chair. Incentive spirometry. Nursing supportive care. Fall, aspiration precautions. DVT prophylaxis   Code Status: Full Code  Subjective: Patient is seen and examined today morning. Appetite poor today. Did not get out of bed. Feels weak. Cough better. He is awaiting SNF placement.  Physical Exam: Vitals:   07/18/23 1303 07/18/23 1340 07/18/23 1400 07/18/23 1430  BP:  (!) 158/72 (!) 168/72 (!) 166/84  Pulse:  65    Resp:      Temp:      TempSrc:      SpO2:   99%    Weight: 57.2 kg     Height:        General - Elderly thin built Caucasian ill male, no apparent distress HEENT - PERRLA, EOMI, atraumatic head, non tender sinuses. Lung - Clear, basal rales, diffuse rhonchi, wheezes. Heart - S1, S2 heard, no murmurs, rubs, trace pedal edema. Abdomen - Soft, non tender, bowel sounds good Neuro - Alert, awake and oriented x 3, non focal exam. Skin - Warm and dry.  Data Reviewed:      Latest Ref Rng & Units 07/18/2023    8:39 AM 07/15/2023    7:08 AM 07/13/2023    8:08 AM  CBC  WBC 4.0 - 10.5 K/uL 4.5  5.7  6.8   Hemoglobin 13.0 - 17.0 g/dL 44.0  9.9  10.2   Hematocrit 39.0 - 52.0 % 32.2  29.4  33.6   Platelets 150 - 400 K/uL 189  176  178       Latest Ref Rng & Units 07/18/2023    6:34 AM 07/17/2023    6:50 AM 07/16/2023    7:13 AM  BMP  Glucose 70 - 99 mg/dL 87  92  58   BUN 8 - 23 mg/dL 59  55  41   Creatinine 0.61 - 1.24 mg/dL 72.53  66.44  0.34   Sodium 135 - 145 mmol/L 128  132  132   Potassium 3.5 - 5.1 mmol/L 4.7  4.5  4.3   Chloride 98 - 111 mmol/L 93  95  91   CO2 22 - 32 mmol/L 21  20  24  Calcium 8.9 - 10.3 mg/dL 7.0  7.4  7.8    No results found.   Family Communication: Discussed with patient, he understand and agree. All questions answereed.  Disposition: Status is: Inpatient Remains inpatient appropriate because: IV antibiotics, encourage out of bed, SNF placement  Planned Discharge Destination: Skilled nursing facility     Time spent: 36 minutes  Author: Marcelino Duster, MD 07/18/2023 2:59 PM Secure chat 7am to 7pm For on call review [email or web address].

## 2023-07-19 DIAGNOSIS — U071 COVID-19: Secondary | ICD-10-CM | POA: Diagnosis not present

## 2023-07-19 LAB — RENAL FUNCTION PANEL
Albumin: 2.5 g/dL — ABNORMAL LOW (ref 3.5–5.0)
Anion gap: 13 (ref 5–15)
BUN: 34 mg/dL — ABNORMAL HIGH (ref 8–23)
CO2: 24 mmol/L (ref 22–32)
Calcium: 7.2 mg/dL — ABNORMAL LOW (ref 8.9–10.3)
Chloride: 94 mmol/L — ABNORMAL LOW (ref 98–111)
Creatinine, Ser: 8.23 mg/dL — ABNORMAL HIGH (ref 0.61–1.24)
GFR, Estimated: 6 mL/min — ABNORMAL LOW (ref >60)
Glucose, Bld: 83 mg/dL (ref 70–99)
Phosphorus: 4.9 mg/dL — ABNORMAL HIGH (ref 2.5–4.6)
Potassium: 4.4 mmol/L (ref 3.5–5.1)
Sodium: 131 mmol/L — ABNORMAL LOW (ref 135–145)

## 2023-07-19 LAB — GLUCOSE, CAPILLARY
Glucose-Capillary: 111 mg/dL — ABNORMAL HIGH (ref 70–99)
Glucose-Capillary: 70 mg/dL (ref 70–99)
Glucose-Capillary: 81 mg/dL (ref 70–99)
Glucose-Capillary: 93 mg/dL (ref 70–99)
Glucose-Capillary: 97 mg/dL (ref 70–99)
Glucose-Capillary: 98 mg/dL (ref 70–99)

## 2023-07-19 MED ORDER — CHLORHEXIDINE GLUCONATE CLOTH 2 % EX PADS
6.0000 | MEDICATED_PAD | Freq: Every day | CUTANEOUS | Status: DC
  Administered 2023-07-20: 6 via TOPICAL

## 2023-07-19 MED ORDER — HEPARIN SODIUM (PORCINE) 1000 UNIT/ML IJ SOLN
4000.0000 [IU] | Freq: Once | INTRAMUSCULAR | Status: AC
  Administered 2023-07-20: 4000 [IU] via INTRAVENOUS
  Filled 2023-07-19: qty 4

## 2023-07-19 NOTE — Progress Notes (Signed)
Physical Therapy Treatment Patient Details Name: Shawn Collins MRN: [id number] DOB: [birth year] Today's Date: 07/19/2023   History of Present Illness 53M  presenting 2/10 with weakness, uremia and confusion after missing dialysis for over 1 week, found to be COVID-19 positive; hypertensive urgency. PMHx: hypertension, hyperlipidemia, CAD s/p CABG, CVA, COPD, ESRD on HD MWF.    PT Comments  Pt making slow, steady progress. Will continue to work on incr ambulation distance and activity tolerance. Continue to feel patient will benefit from continued inpatient follow up therapy, <3 hours/day.      If plan is discharge home, recommend the following: A little help with walking and/or transfers;A little help with bathing/dressing/bathroom;Direct supervision/assist for medications management;Assist for transportation;Direct supervision/assist for financial management;Help with stairs or ramp for entrance;Supervision due to cognitive status;Assistance with cooking/housework   Can travel by private vehicle     Yes  Equipment Recommendations  None recommended by PT    Recommendations for Other Services       Precautions / Restrictions Precautions Precautions: Fall Recall of Precautions/Restrictions: Impaired Restrictions Weight Bearing Restrictions Per Provider Order: No     Mobility  Bed Mobility Overal bed mobility: Needs Assistance Bed Mobility: Supine to Sit     Supine to sit: Contact guard     General bed mobility comments: Incr time and effort    Transfers Overall transfer level: Needs assistance Equipment used: Rolling walker (2 wheels) Transfers: Sit to/from Stand Sit to Stand: Contact guard assist           General transfer comment: Assist for safety    Ambulation/Gait Ambulation/Gait assistance: Contact guard assist Gait Distance (Feet): 95 Feet Assistive device: Rolling walker (2 wheels) Gait Pattern/deviations: Step-through pattern, Decreased stride length,  Decreased dorsiflexion - right, Decreased dorsiflexion - left, Drifts right/left, Trunk flexed Gait velocity: decr Gait velocity interpretation: <1.31 ft/sec, indicative of household ambulator   General Gait Details: Verbal cues for upright posture   Stairs             Wheelchair Mobility     Tilt Bed    Modified Rankin (Stroke Patients Only)       Balance Overall balance assessment: Needs assistance Sitting-balance support: Feet supported, No upper extremity supported Sitting balance-Leahy Scale: Fair     Standing balance support: Bilateral upper extremity supported, During functional activity, Reliant on assistive device for balance Standing balance-Leahy Scale: Poor Standing balance comment: walker and CGA for static standing                            Communication Communication Communication: No apparent difficulties  Cognition Arousal: Alert Behavior During Therapy: Flat affect   PT - Cognitive impairments: No family/caregiver present to determine baseline, Sequencing, Problem solving, Safety/Judgement, Initiation                       PT - Cognition Comments: Needed repeated cues to distinguish call bell from telephone Following commands: Intact      Cueing Cueing Techniques: Verbal cues, Gestural cues, Tactile cues  Exercises      General Comments        Pertinent Vitals/Pain Pain Assessment Pain Assessment: No/denies pain    Home Living                          Prior Function            PT Goals (current  goals can now be found in the care plan section) Acute Rehab PT Goals Patient Stated Goal: Get well Progress towards PT goals: Progressing toward goals    Frequency    Min 1X/week      PT Plan      Co-evaluation              AM-PAC PT "6 Clicks" Mobility   Outcome Measure  Help needed turning from your back to your side while in a flat bed without using bedrails?: A Little Help needed  moving from lying on your back to sitting on the side of a flat bed without using bedrails?: A Little Help needed moving to and from a bed to a chair (including a wheelchair)?: A Little Help needed standing up from a chair using your arms (e.g., wheelchair or bedside chair)?: A Little Help needed to walk in hospital room?: A Little Help needed climbing 3-5 steps with a railing? : A Lot 6 Click Score: 17    End of Session Equipment Utilized During Treatment: Gait belt Activity Tolerance: Patient tolerated treatment well Patient left: with call bell/phone within reach;in chair;with chair alarm set   PT Visit Diagnosis: Unsteadiness on feet (R26.81);Other abnormalities of gait and mobility (R26.89);Muscle weakness (generalized) (M62.81);Other symptoms and signs involving the nervous system (R29.898)     Time: 1610-9604 PT Time Calculation (min) (ACUTE ONLY): 23 min  Charges:    $Gait Training: 23-37 mins PT General Charges $$ ACUTE PT VISIT: 1 Visit                     [facility] PT Acute Rehabilitation Services Office [phone]    Angelina Ok [facility] 07/19/2023, 1:29 PM

## 2023-07-19 NOTE — Progress Notes (Signed)
Admit: 07/10/2023 LOS: 9  51M ESRD MWF presenting with weakness, uremia and confusion after missing dialysis for over 1 week, COVID-19 positive; hypertensive urgency  Subjective:  Seen in room No c/o's today SNF has accepted possibly for tomorrow    02/18 0701 - 02/19 0700 In: 118 [P.O.:118] Out: 625 [Urine:125]  Filed Weights   07/15/23 1030 07/15/23 1423 07/18/23 1303  Weight: 59.8 kg 56.8 kg 57.2 kg    Scheduled Meds:  amLODipine  10 mg Oral QHS   aspirin  81 mg Oral Daily   atorvastatin  40 mg Oral QHS   Chlorhexidine Gluconate Cloth  6 each Topical Q0600   Chlorhexidine Gluconate Cloth  6 each Topical Q0600   chlorpheniramine-HYDROcodone  5 mL Oral Q12H   cinacalcet  30 mg Oral Q supper   doxercalciferol  4 mcg Intravenous Q M,W,F-HD   doxycycline  100 mg Oral Q12H   feeding supplement  237 mL Oral BID BM   ferrous sulfate  325 mg Oral Q breakfast   guaiFENesin  600 mg Oral BID   heparin  5,000 Units Subcutaneous Q8H   hydrALAZINE  50 mg Oral Q8H   losartan  100 mg Oral Daily   polyethylene glycol  17 g Oral Daily   sevelamer carbonate  1,600 mg Oral TID WC   Continuous Infusions:  anticoagulant sodium citrate     cefTRIAXone (ROCEPHIN)  IV 1 g (07/18/23 2120)   PRN Meds:.acetaminophen **OR** acetaminophen, albuterol, alteplase, alum & mag hydroxide-simeth, anticoagulant sodium citrate, dextrose, feeding supplement (NEPRO CARB STEADY), heparin, heparin, HYDROmorphone, lidocaine (PF), lidocaine-prilocaine, pentafluoroprop-tetrafluoroeth  Current Labs: reviewed    Physical Exam:  Blood pressure (!) 159/71, pulse 72, temperature 99.3 F (37.4 C), temperature source Oral, resp. rate 18, height 5\' 10"  (1.778 m), weight 57.2 kg, SpO2 96%. NAD, alert, hard to understand, pleasant, uncomfortable d/t fevers, others Regular, normal S1 and S2 Clear bilat coarse rhonchi, coughing off and on  Left arm AV graft +bruit and thrill No peripheral edema   Renal-related home  meds:  - norvasc 2.5 hs - others: sl ntg prn    Dialysis Orders: MWF [facility]  3h  B400  57kg 2/2 bath  Heparin 5000   L AVG Mircera 75 mcg q2wks - recently restarted Hectorol IV qHD - last dose 06/30/23 Sensipar 30mg  with HD - last dose 06/30/23  A Confusion/weakness: Likely uremia contributing, also COVID-19. Oriented x 3 today. Looking close to normal today.  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 Is off schedule this week, HD tomorrow.  Volume: down to dry wt now, and looks euvolemic. 1.5 - 2L next HD.  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 CV-19 precautions expire 2/20 tomorrow.  Anemia of CKD: Hb 12s on admit, now down to 9-10 range. Consider esa soon if Hb drops again CKD-BMD/secondary hyperparathyroidism: CCa in range, cont IV vdra and sensipar w hd.  Phos is high, cont binders w/ meals.  Severe hypertension w/ nonadherence: getting norvasc (higher dose here) and 2 new meds losartan and hydralazine. BP's stable. Possible epididymitis - getting IV abx rocephin and doxy here per urology rec's.   P Keep him on TTS schedule this week When discharged will go back to MWF Comes off precautions on Thursday SNF placement possibly tomorrow  Medication Issues; Preferred narcotic agents for pain control are hydromorphone, fentanyl, and methadone. Morphine should not be used.  Baclofen should be avoided Avoid oral sodium phosphate and magnesium citrate based laxatives or bowel preps   Rob  Jann Milkovich  MD  [facility] 07/19/2023, 12:18 PM  Recent Labs  Lab 07/15/23 0708 07/16/23 0713 07/18/23 0634 07/18/23 0839 07/19/23 0641  HGB 9.9*  --   --  10.5*  --   ALBUMIN 2.3*   < > 2.5*  --  2.5*  CALCIUM 7.5*   < > 7.0*  --  7.2*  PHOS 7.6*   < > 7.3*  --  4.9*  CREATININE 11.30*   < > 12.11*  --  8.23*  K 4.4   < > 4.7  --  4.4   < > = values in this interval not displayed.    Inpatient medications:  amLODipine  10 mg Oral QHS   aspirin  81 mg Oral Daily   atorvastatin  40 mg Oral QHS    Chlorhexidine Gluconate Cloth  6 each Topical Q0600   Chlorhexidine Gluconate Cloth  6 each Topical Q0600   chlorpheniramine-HYDROcodone  5 mL Oral Q12H   cinacalcet  30 mg Oral Q supper   doxercalciferol  4 mcg Intravenous Q M,W,F-HD   doxycycline  100 mg Oral Q12H   feeding supplement  237 mL Oral BID BM   ferrous sulfate  325 mg Oral Q breakfast   guaiFENesin  600 mg Oral BID   heparin  5,000 Units Subcutaneous Q8H   hydrALAZINE  50 mg Oral Q8H   losartan  100 mg Oral Daily   polyethylene glycol  17 g Oral Daily   sevelamer carbonate  1,600 mg Oral TID WC    anticoagulant sodium citrate     cefTRIAXone (ROCEPHIN)  IV 1 g (07/18/23 2120)   acetaminophen **OR** acetaminophen, albuterol, alteplase, alum & mag hydroxide-simeth, anticoagulant sodium citrate, dextrose, feeding supplement (NEPRO CARB STEADY), heparin, heparin, HYDROmorphone, lidocaine (PF), lidocaine-prilocaine, pentafluoroprop-tetrafluoroeth

## 2023-07-19 NOTE — Plan of Care (Signed)
  Problem: Education: Goal: Knowledge of risk factors and measures for prevention of condition will improve Outcome: Progressing   Problem: Coping: Goal: Psychosocial and spiritual needs will be supported Outcome: Progressing   Problem: Respiratory: Goal: Will maintain a patent airway Outcome: Progressing Goal: Complications related to the disease process, condition or treatment will be avoided or minimized Outcome: Progressing   Problem: Education: Goal: Knowledge of General Education information will improve Description: Including pain rating scale, medication(s)/side effects and non-pharmacologic comfort measures Outcome: Progressing   Problem: Health Behavior/Discharge Planning: Goal: Ability to manage health-related needs will improve Outcome: Progressing   Problem: Clinical Measurements: Goal: Ability to maintain clinical measurements within normal limits will improve Outcome: Progressing Goal: Will remain free from infection Outcome: Progressing Goal: Diagnostic test results will improve Outcome: Progressing Goal: Respiratory complications will improve Outcome: Progressing Goal: Cardiovascular complication will be avoided Outcome: Progressing   Problem: Activity: Goal: Risk for activity intolerance will decrease Outcome: Progressing   Problem: Nutrition: Goal: Adequate nutrition will be maintained Outcome: Progressing   Problem: Coping: Goal: Level of anxiety will decrease Outcome: Progressing   Problem: Elimination: Goal: Will not experience complications related to bowel motility Outcome: Progressing Goal: Will not experience complications related to urinary retention Outcome: Progressing   Problem: Pain Managment: Goal: General experience of comfort will improve and/or be controlled Outcome: Progressing   Problem: Safety: Goal: Ability to remain free from injury will improve Outcome: Progressing   Problem: Skin Integrity: Goal: Risk for impaired  skin integrity will decrease Outcome: Progressing   Problem: Education: Goal: Ability to describe self-care measures that may prevent or decrease complications (Diabetes Survival Skills Education) will improve Outcome: Progressing   Problem: Coping: Goal: Ability to adjust to condition or change in health will improve Outcome: Progressing   Problem: Fluid Volume: Goal: Ability to maintain a balanced intake and output will improve Outcome: Progressing   Problem: Health Behavior/Discharge Planning: Goal: Ability to identify and utilize available resources and services will improve Outcome: Progressing Goal: Ability to manage health-related needs will improve Outcome: Progressing   Problem: Metabolic: Goal: Ability to maintain appropriate glucose levels will improve Outcome: Progressing   Problem: Nutritional: Goal: Maintenance of adequate nutrition will improve Outcome: Progressing Goal: Progress toward achieving an optimal weight will improve Outcome: Progressing   Problem: Skin Integrity: Goal: Risk for impaired skin integrity will decrease Outcome: Progressing   Problem: Tissue Perfusion: Goal: Adequacy of tissue perfusion will improve Outcome: Progressing

## 2023-07-19 NOTE — Progress Notes (Signed)
PROGRESS NOTE    Shawn Collins  HQI:[id number] DOB: 1951/04/17 DOA: 07/10/2023 PCP: Fleet Contras, MD     Brief Narrative:  Shawn Collins is a 73 y.o. male with medical history significant of hypertension, hyperlipidemia, CAD s/p CABG, CVA, COPD, and ESRD on HD presents with complaints of weakness and cough. He missed HD sessions as he felt weak, eating poor. EMS found him disheveled, unkempt, blood glucose low. COVID 19 tested positive admitted to hospitalist service for further management and evaluation.  New events last 24 hours / Subjective: Patient without any new complaints today.  Today is last day of COVID isolation precaution  Assessment & Plan:   Principal Problem:   COVID-19 Active Problems:   ESRD (end stage renal disease) on dialysis (HCC)   Hyperkalemia   Metabolic acidosis with increased anion gap and accumulation of organic acids   Hypertensive urgency   Hypoglycemia   Anemia of chronic disease   Weakness   COPD (chronic obstructive pulmonary disease) (HCC)   CAD (coronary artery disease), native coronary artery   COVID-19 -Supportive care -Tested positive 2/10 and will complete isolation 2/20  Possible epididymitis, bilateral hydrocele -Previous hospitalist discussed with urology, see note 2/12 -Rocephin, doxycycline for 10 days   ESRD on HD -Per nephrology  Anemia of chronic kidney disease -Stable  Acute metabolic encephalopathy -Resolved  Hypoglycemia -Resolved  Hyperkalemia -Resolved  CAD status post CABG -Aspirin, statin  HTN -Norvasc, cozaar   Severe malnutrition -Estimated body mass index is 18.09 kg/m as calculated from the following:   Height as of this encounter: 5\' 10"  (1.778 m).   Weight as of this encounter: 57.2 kg.   DVT prophylaxis:  heparin injection 5,000 Units Start: 07/10/23 1730  Code Status: Full Family Communication: None at bedside Disposition Plan: SNF  Status is: Inpatient Remains inpatient appropriate  because: COVID isolation    Antimicrobials:  Anti-infectives (From admission, onward)    Start     Dose/Rate Route Frequency Ordered Stop   07/13/23 1045  doxycycline (VIBRA-TABS) tablet 100 mg        100 mg Oral Every 12 hours 07/13/23 0959 07/23/23 1501   07/12/23 1515  cefTRIAXone (ROCEPHIN) 1 g in sodium chloride 0.9 % 100 mL IVPB        1 g 200 mL/hr over 30 Minutes Intravenous Daily 07/12/23 1457 07/22/23 1501        Objective: Vitals:   07/19/23 0009 07/19/23 0312 07/19/23 0444 07/19/23 0807  BP: (!) 181/72 (!) 180/77 (!) 164/71 (!) 159/71  Pulse: 75 70 75 72  Resp: 18  18 18   Temp: 99.4 F (37.4 C)  98.8 F (37.1 C) 99.3 F (37.4 C)  TempSrc: Oral  Oral Oral  SpO2: 96%  95% 96%  Weight:      Height:        Intake/Output Summary (Last 24 hours) at 07/19/2023 1037 Last data filed at 07/18/2023 1723 Gross per 24 hour  Intake --  Output 625 ml  Net -625 ml   Filed Weights   07/15/23 1030 07/15/23 1423 07/18/23 1303  Weight: 59.8 kg 56.8 kg 57.2 kg    Examination:  General exam: Appears calm and comfortable  Respiratory system: Clear to auscultation. Respiratory effort normal. No respiratory distress. No conversational dyspnea.  Gastrointestinal system: Abdomen is nondistended, soft  Central nervous system: Alert and oriented.  Extremities: Symmetric in appearance  Psychiatry: Judgement and insight appear normal. Mood & affect appropriate.   Data Reviewed: I have personally reviewed  following labs and imaging studies  CBC: Recent Labs  Lab 07/13/23 0808 07/15/23 0708 07/18/23 0839  WBC 6.8 5.7 4.5  HGB 11.2* 9.9* 10.5*  HCT 33.6* 29.4* 32.2*  MCV 90.8 92.7 92.5  PLT 178 176 189   Basic Metabolic Panel: Recent Labs  Lab 07/15/23 0708 07/16/23 0713 07/17/23 0650 07/18/23 0634 07/19/23 0641  NA 131* 132* 132* 128* 131*  K 4.4 4.3 4.5 4.7 4.4  CL 92* 91* 95* 93* 94*  CO2 24 24 20* 21* 24  GLUCOSE 76 58* 92 87 83  BUN 67* 41* 55* 59* 34*   CREATININE 11.30* 9.09* 10.30* 12.11* 8.23*  CALCIUM 7.5* 7.8* 7.4* 7.0* 7.2*  PHOS 7.6* 6.1* 7.6* 7.3* 4.9*   GFR: Estimated Creatinine Clearance: 6.4 mL/min (A) (by C-G formula based on SCr of 8.23 mg/dL (H)). Liver Function Tests: Recent Labs  Lab 07/15/23 0708 07/16/23 0713 07/17/23 0650 07/18/23 0634 07/19/23 0641  ALBUMIN 2.3* 2.5* 2.5* 2.5* 2.5*   No results for input(s): "LIPASE", "AMYLASE" in the last 168 hours. No results for input(s): "AMMONIA" in the last 168 hours. Coagulation Profile: No results for input(s): "INR", "PROTIME" in the last 168 hours. Cardiac Enzymes: No results for input(s): "CKTOTAL", "CKMB", "CKMBINDEX", "TROPONINI" in the last 168 hours. BNP (last 3 results) No results for input(s): "PROBNP" in the last 8760 hours. HbA1C: No results for input(s): "HGBA1C" in the last 72 hours. CBG: Recent Labs  Lab 07/18/23 1900 07/18/23 2019 07/19/23 0010 07/19/23 0436 07/19/23 0803  GLUCAP 131* 148* 98 97 70   Lipid Profile: No results for input(s): "CHOL", "HDL", "LDLCALC", "TRIG", "CHOLHDL", "LDLDIRECT" in the last 72 hours. Thyroid Function Tests: No results for input(s): "TSH", "T4TOTAL", "FREET4", "T3FREE", "THYROIDAB" in the last 72 hours. Anemia Panel: No results for input(s): "VITAMINB12", "FOLATE", "FERRITIN", "TIBC", "IRON", "RETICCTPCT" in the last 72 hours. Sepsis Labs: No results for input(s): "PROCALCITON", "LATICACIDVEN" in the last 168 hours.  Recent Results (from the past 240 hours)  Resp panel by RT-PCR (RSV, Flu A&B, Covid) Anterior Nasal Swab     Status: Abnormal   Collection Time: 07/10/23  1:20 PM   Specimen: Anterior Nasal Swab  Result Value Ref Range Status   SARS Coronavirus 2 by RT PCR POSITIVE (A) NEGATIVE Final   Influenza A by PCR NEGATIVE NEGATIVE Final   Influenza B by PCR NEGATIVE NEGATIVE Final    Comment: (NOTE) The Xpert Xpress SARS-CoV-2/FLU/RSV plus assay is intended as an aid in the diagnosis of influenza  from Nasopharyngeal swab specimens and should not be used as a sole basis for treatment. Nasal washings and aspirates are unacceptable for Xpert Xpress SARS-CoV-2/FLU/RSV testing.  Fact Sheet for Patients: BloggerCourse.com  Fact Sheet for Healthcare Providers: SeriousBroker.it  This test is not yet approved or cleared by the [place] FDA and has been authorized for detection and/or diagnosis of SARS-CoV-2 by FDA under an Emergency Use Authorization (EUA). This EUA will remain in effect (meaning this test can be used) for the duration of the COVID-19 declaration under Section 564(b)(1) of the Act, 21 U.S.C. section 360bbb-3(b)(1), unless the authorization is terminated or revoked.     Resp Syncytial Virus by PCR NEGATIVE NEGATIVE Final    Comment: (NOTE) Fact Sheet for Patients: BloggerCourse.com  Fact Sheet for Healthcare Providers: SeriousBroker.it  This test is not yet approved or cleared by the [place] FDA and has been authorized for detection and/or diagnosis of SARS-CoV-2 by FDA under an Emergency Use Authorization (EUA). This EUA  will remain in effect (meaning this test can be used) for the duration of the COVID-19 declaration under Section 564(b)(1) of the Act, 21 U.S.C. section 360bbb-3(b)(1), unless the authorization is terminated or revoked.  Performed at [facility], [address], [place], [place] [address]       Radiology Studies: No results found.    Scheduled Meds:  amLODipine  10 mg Oral QHS   aspirin  81 mg Oral Daily   atorvastatin  40 mg Oral QHS   Chlorhexidine Gluconate Cloth  6 each Topical Q0600   Chlorhexidine Gluconate Cloth  6 each Topical Q0600   chlorpheniramine-HYDROcodone  5 mL Oral Q12H   cinacalcet  30 mg Oral Q supper   doxercalciferol  4 mcg Intravenous Q M,W,F-HD   doxycycline  100 mg Oral Q12H   feeding  supplement  237 mL Oral BID BM   ferrous sulfate  325 mg Oral Q breakfast   guaiFENesin  600 mg Oral BID   heparin  5,000 Units Subcutaneous Q8H   hydrALAZINE  50 mg Oral Q8H   losartan  100 mg Oral Daily   polyethylene glycol  17 g Oral Daily   sevelamer carbonate  1,600 mg Oral TID WC   Continuous Infusions:  anticoagulant sodium citrate     cefTRIAXone (ROCEPHIN)  IV 1 g (07/18/23 2120)     LOS: 9 days   Time spent: 25 minutes   Noralee Stain, DO [facility] 07/19/2023, 10:37 AM   Available via Epic secure chat 7am-7pm After these hours, please refer to coverage provider listed on [email or web address]

## 2023-07-20 DIAGNOSIS — U071 COVID-19: Secondary | ICD-10-CM | POA: Diagnosis not present

## 2023-07-20 LAB — RENAL FUNCTION PANEL
Albumin: 2.6 g/dL — ABNORMAL LOW (ref 3.5–5.0)
Anion gap: 11 (ref 5–15)
BUN: 19 mg/dL (ref 8–23)
CO2: 27 mmol/L (ref 22–32)
Calcium: 7.4 mg/dL — ABNORMAL LOW (ref 8.9–10.3)
Chloride: 95 mmol/L — ABNORMAL LOW (ref 98–111)
Creatinine, Ser: 5.49 mg/dL — ABNORMAL HIGH (ref 0.61–1.24)
GFR, Estimated: 10 mL/min — ABNORMAL LOW (ref >60)
Glucose, Bld: 56 mg/dL — ABNORMAL LOW (ref 70–99)
Phosphorus: 3.1 mg/dL (ref 2.5–4.6)
Potassium: 4 mmol/L (ref 3.5–5.1)
Sodium: 133 mmol/L — ABNORMAL LOW (ref 135–145)

## 2023-07-20 LAB — GLUCOSE, CAPILLARY
Glucose-Capillary: 53 mg/dL — ABNORMAL LOW (ref 70–99)
Glucose-Capillary: 77 mg/dL (ref 70–99)
Glucose-Capillary: 85 mg/dL (ref 70–99)

## 2023-07-20 MED ORDER — DOXYCYCLINE HYCLATE 100 MG PO TABS: 100.0000 mg | ORAL_TABLET | Freq: Two times a day (BID) | ORAL | Status: AC

## 2023-07-20 MED ORDER — HYDRALAZINE HCL 50 MG PO TABS: 50.0000 mg | ORAL_TABLET | Freq: Three times a day (TID) | ORAL | Status: DC

## 2023-07-20 MED ORDER — ASPIRIN 81 MG PO CHEW: 81.0000 mg | CHEWABLE_TABLET | Freq: Every day | ORAL | Status: DC

## 2023-07-20 MED ORDER — ATORVASTATIN CALCIUM 40 MG PO TABS: 40.0000 mg | ORAL_TABLET | Freq: Every day | ORAL | Status: DC

## 2023-07-20 MED ORDER — AMLODIPINE BESYLATE 10 MG PO TABS: 10.0000 mg | ORAL_TABLET | Freq: Every day | ORAL | Status: DC

## 2023-07-20 MED ORDER — LOSARTAN POTASSIUM 100 MG PO TABS: 100.0000 mg | ORAL_TABLET | Freq: Every day | ORAL | Status: DC

## 2023-07-20 NOTE — Care Management Important Message (Signed)
Important Message  Patient Details  Name: Shawn Collins MRN: [id number] Date of Birth: Dec 01, 1950   Important Message Given:  Yes - Medicare IM     Dorena Bodo 07/20/2023, 12:32 PM

## 2023-07-20 NOTE — Discharge Planning (Signed)
[facility] Patient Discharge Orders- [facility] CL[facility]IC: [facility]  Patient's name: Coleston Dirosa Admit/DC Dates: 07/10/2023 - 07/20/2023  Discharge Diagnoses: Covid   Confusion/Weakness-missed HD  Aranesp: Given: No   Date and amount of last dose: NA  Last Hgb: 10.5 PRBC's Given: No Date/# of units: NA ESA dose for discharge: mircera 75 mcg IV q 2 weeks  IV Iron dose at discharge: Per anemia protocol  Heparin change: No  EDW Change: Yes  New EDW: 55.5 kg  Bath Change: No  Access intervention/Change: NA Details:  Hectorol/Calcitriol change: NA  Discharge Labs: Calcium  7.4 Phosphorus 3.1 Albumin 2.6 K+ 4.0  IV Antibiotics: No Details:  On Coumadin?: NO Last INR: Next INR: Managed By:   OTHER/APPTS/LAB ORDERS: NA    D/C Meds to be reconciled by nurse after every discharge.  Completed By: Alonna Buckler [facility] [phone]    Reviewed by: MD:______ RN_______

## 2023-07-20 NOTE — TOC Transition Note (Signed)
Transition of Care [facility]) - Discharge Note   Patient Details  Name: Shawn Collins MRN: [id number] Date of Birth: [birth year]  Transition of [facility]) CM/SW Contact:  Taylinn Brabant A [place], LCSW Phone Number: 07/20/2023, 12:38 PM   Clinical Narrative:     Patient will DC to: [facility]  Anticipated DC date: 07/20/23  Family notified: Denice Paradise  Transport by: Sharin Mons      Per MD patient ready for DC to .[facility] RN, patient, patient's family, and facility notified of DC. Discharge Summary and FL2 sent to facility. RN to call report prior to discharge ( 807p, [phone]). DC packet on chart. Ambulance transport requested for patient.     CSW will sign off for now as social work intervention is no longer needed. Please consult [place] again if new needs arise.   Final next level of care: Skilled Nursing Facility Barriers to Discharge: Barriers Resolved   Patient Goals and CMS Choice            Discharge Placement              Patient chooses bed at: [facility] Patient to be transferred to facility by: [facility] Name of family member notified: Denice Paradise Patient and family notified of of transfer: 07/20/23  Discharge Plan and Services Additional resources added to the After Visit Summary for                                       Social Drivers of Health (SDOH) Interventions SDOH Screenings   Food Insecurity: No Food Insecurity (07/10/2023)  Housing: Low Risk  (07/10/2023)  Transportation Needs: No Transportation Needs (07/10/2023)  Utilities: Not At Risk (07/10/2023)  Social Connections: Moderately Isolated (07/10/2023)  Tobacco Use: Medium Risk (07/10/2023)     Readmission Risk Interventions    07/07/2022    9:49 AM  Readmission Risk Prevention Plan  Transportation Screening Complete  PCP or Specialist Appt within 3-5 Days Complete  HRI or Home Care Consult Complete  Social Work Consult for Recovery Care Planning/Counseling --  Palliative Care Screening  Not Applicable  Medication Review [employer]) Complete

## 2023-07-20 NOTE — Plan of Care (Signed)
  Problem: Education: Goal: Knowledge of risk factors and measures for prevention of condition will improve Outcome: Progressing   Problem: Coping: Goal: Psychosocial and spiritual needs will be supported Outcome: Progressing   Problem: Respiratory: Goal: Will maintain a patent airway Outcome: Progressing Goal: Complications related to the disease process, condition or treatment will be avoided or minimized Outcome: Progressing   Problem: Education: Goal: Knowledge of General Education information will improve Description: Including pain rating scale, medication(s)/side effects and non-pharmacologic comfort measures Outcome: Progressing   Problem: Health Behavior/Discharge Planning: Goal: Ability to manage health-related needs will improve Outcome: Progressing   Problem: Clinical Measurements: Goal: Ability to maintain clinical measurements within normal limits will improve Outcome: Progressing Goal: Will remain free from infection Outcome: Progressing Goal: Diagnostic test results will improve Outcome: Progressing Goal: Respiratory complications will improve Outcome: Progressing Goal: Cardiovascular complication will be avoided Outcome: Progressing   Problem: Activity: Goal: Risk for activity intolerance will decrease Outcome: Progressing   Problem: Nutrition: Goal: Adequate nutrition will be maintained Outcome: Progressing   Problem: Coping: Goal: Level of anxiety will decrease Outcome: Progressing   Problem: Elimination: Goal: Will not experience complications related to bowel motility Outcome: Progressing Goal: Will not experience complications related to urinary retention Outcome: Progressing   Problem: Pain Managment: Goal: General experience of comfort will improve and/or be controlled Outcome: Progressing   Problem: Safety: Goal: Ability to remain free from injury will improve Outcome: Progressing   Problem: Skin Integrity: Goal: Risk for impaired  skin integrity will decrease Outcome: Progressing   Problem: Education: Goal: Ability to describe self-care measures that may prevent or decrease complications (Diabetes Survival Skills Education) will improve Outcome: Progressing   Problem: Coping: Goal: Ability to adjust to condition or change in health will improve Outcome: Progressing   Problem: Fluid Volume: Goal: Ability to maintain a balanced intake and output will improve Outcome: Progressing   Problem: Health Behavior/Discharge Planning: Goal: Ability to identify and utilize available resources and services will improve Outcome: Progressing Goal: Ability to manage health-related needs will improve Outcome: Progressing   Problem: Metabolic: Goal: Ability to maintain appropriate glucose levels will improve Outcome: Progressing   Problem: Nutritional: Goal: Maintenance of adequate nutrition will improve Outcome: Progressing Goal: Progress toward achieving an optimal weight will improve Outcome: Progressing   Problem: Skin Integrity: Goal: Risk for impaired skin integrity will decrease Outcome: Progressing   Problem: Tissue Perfusion: Goal: Adequacy of tissue perfusion will improve Outcome: Progressing

## 2023-07-20 NOTE — Discharge Summary (Signed)
Physician Discharge Summary  Shawn Collins KVQ:[id number] DOB: Jul 24, 1950 DOA: 07/10/2023  PCP: Fleet Contras, MD  Admit date: 07/10/2023 Discharge date: 07/20/2023  Disposition:  SNF  Recommendations for Outpatient Follow-up:  Follow up with PCP  Discharge Condition: Stable CODE STATUS: Full  Diet recommendation: Renal diet   Brief/Interim Summary: Shawn Collins is a 73 y.o. male with medical history significant of hypertension, hyperlipidemia, CAD s/p CABG, CVA, COPD, and ESRD on HD presents with complaints of weakness and cough. He missed HD sessions as he felt weak, eating poor. EMS found him disheveled, unkempt, blood glucose low. COVID 19 tested positive admitted to hospitalist service for further management and evaluation.  Patient was managed with supportive care for COVID and remained in isolation prior to discharge to skilled nursing facility.  During hospitalization, he underwent hemodialysis per nephrology.  Discharge Diagnoses:   Principal Problem:   COVID-19 Active Problems:   ESRD (end stage renal disease) on dialysis (HCC)   Hyperkalemia   Metabolic acidosis with increased anion gap and accumulation of organic acids   Hypertensive urgency   Hypoglycemia   Anemia of chronic disease   Weakness   COPD (chronic obstructive pulmonary disease) (HCC)   CAD (coronary artery disease), native coronary artery    COVID-19 -Supportive care -Tested positive 2/10    Possible epididymitis, bilateral hydrocele -Previous hospitalist discussed with urology, see note 2/12 -Rocephin, doxycycline for 10 days (last day doxy 2/23)    ESRD on HD -Per nephrology   Anemia of chronic kidney disease -Stable   Acute metabolic encephalopathy -Resolved   Hypoglycemia -Resolved   Hyperkalemia -Resolved   CAD status post CABG -Aspirin, statin   HTN -Norvasc, hydralazine, cozaar    Severe malnutrition -Estimated body mass index is 18.09 kg/m as calculated from the  following:   Height as of this encounter: 5\' 10"  (1.778 m).   Weight as of this encounter: 57.2 kg.    Discharge Instructions  Discharge Instructions     Increase activity slowly   Complete by: As directed       Allergies as of 07/20/2023   No Known Allergies      Medication List     TAKE these medications    amLODipine 10 MG tablet Commonly known as: NORVASC Take 1 tablet (10 mg total) by mouth at bedtime. What changed:  medication strength how much to take   aspirin 81 MG chewable tablet Chew 1 tablet (81 mg total) by mouth daily.   atorvastatin 40 MG tablet Commonly known as: LIPITOR Take 1 tablet (40 mg total) by mouth at bedtime.   doxycycline 100 MG tablet Commonly known as: VIBRA-TABS Take 1 tablet (100 mg total) by mouth every 12 (twelve) hours for 3 days.   hydrALAZINE 50 MG tablet Commonly known as: APRESOLINE Take 1 tablet (50 mg total) by mouth every 8 (eight) hours.   losartan 100 MG tablet Commonly known as: COZAAR Take 1 tablet (100 mg total) by mouth daily.   nitroGLYCERIN 0.4 MG SL tablet Commonly known as: NITROSTAT Place 0.4 mg under the tongue every 5 (five) minutes as needed for chest pain.        Follow-up Information     Fleet Contras, MD Follow up.   Specialty: Internal Medicine Contact information: [address] [place] [address] [phone]                No Known Allergies    Procedures/Studies: US SCROTUM W/DOPPLER Result Date: 07/12/2023 CLINICAL DATA:  Persistent  testicular pain. EXAM: SCROTAL ULTRASOUND DOPPLER ULTRASOUND OF THE TESTICLES TECHNIQUE: Complete ultrasound examination of the testicles, epididymis, and other scrotal structures was performed. Color and spectral Doppler ultrasound were also utilized to evaluate blood flow to the testicles. COMPARISON:  None Available. FINDINGS: Right testicle Measurements: 3.7 x 1.9 x 2.7 cm. No mass or microlithiasis visualized. Left testicle  Measurements: 3.7 x 1.7 x 2.6 cm. No mass or microlithiasis visualized. Right epididymis:  A 3 mm right epididymal head cyst. Left epididymis: There is slight hyperemia of the left tenderness. Clinical correlation recommended to evaluate for possibility of epididymitis. Hydrocele: Moderate bilateral hydroceles, right greater than left, and containing low-level floating echogenic debris. Varicocele:  Borderline right varicocele. Pulsed Doppler interrogation of both testes demonstrates normal low resistance arterial and venous waveforms bilaterally. IMPRESSION: 1. Unremarkable testicles. 2. Possible left epididymitis.  Clinical correlation recommended. 3. Moderate minimally complex bilateral hydroceles, right greater than left. Electronically Signed   By: Elgie Collard M.D.   On: 07/12/2023 13:23   DG Chest Portable 1 View Result Date: 07/10/2023 CLINICAL DATA:  Edema.  End-stage renal disease.  Weakness. EXAM: PORTABLE CHEST 1 VIEW COMPARISON:  04/12/2023 FINDINGS: Bilateral lung fields are clear. No pulmonary edema. Bilateral costophrenic angles are clear. Note is made of elevated right hemidiaphragm. Stable cardio-mediastinal silhouette. There are surgical staples along the heart border and sternotomy wires, status post CABG (coronary artery bypass graft). No acute osseous abnormalities. The soft tissues are within normal limits. IMPRESSION: No active disease. Electronically Signed   By: Jules Schick M.D.   On: 07/10/2023 15:34      Discharge Exam: Vitals:   07/20/23 0644 07/20/23 0749  BP: 139/70 (!) 163/71  Pulse:  74  Resp:  18  Temp:  98.7 F (37.1 C)  SpO2:  96%     General: Pt is alert, awake, not in acute distress Cardiovascular: RRR, S1/S2 +, no edema Respiratory: CTA bilaterally, no wheezing, no rhonchi, no respiratory distress, no conversational dyspnea  Abdominal: Soft, NT, ND, bowel sounds + Extremities: no edema, no cyanosis Psych: Normal mood and affect, stable judgement  and insight     The results of significant diagnostics from this hospitalization (including imaging, microbiology, ancillary and laboratory) are listed below for reference.     Microbiology: Recent Results (from the past 240 hours)  Resp panel by RT-PCR (RSV, Flu A&B, Covid) Anterior Nasal Swab     Status: Abnormal   Collection Time: 07/10/23  1:20 PM   Specimen: Anterior Nasal Swab  Result Value Ref Range Status   SARS Coronavirus 2 by RT PCR POSITIVE (A) NEGATIVE Final   Influenza A by PCR NEGATIVE NEGATIVE Final   Influenza B by PCR NEGATIVE NEGATIVE Final    Comment: (NOTE) The Xpert Xpress SARS-CoV-2/FLU/RSV plus assay is intended as an aid in the diagnosis of influenza from Nasopharyngeal swab specimens and should not be used as a sole basis for treatment. Nasal washings and aspirates are unacceptable for Xpert Xpress SARS-CoV-2/FLU/RSV testing.  Fact Sheet for Patients: BloggerCourse.com  Fact Sheet for Healthcare Providers: SeriousBroker.it  This test is not yet approved or cleared by the [place] FDA and has been authorized for detection and/or diagnosis of SARS-CoV-2 by FDA under an Emergency Use Authorization (EUA). This EUA will remain in effect (meaning this test can be used) for the duration of the COVID-19 declaration under Section 564(b)(1) of the Act, 21 U.S.C. section 360bbb-3(b)(1), unless the authorization is terminated or revoked.     Resp  Syncytial Virus by PCR NEGATIVE NEGATIVE Final    Comment: (NOTE) Fact Sheet for Patients: BloggerCourse.com  Fact Sheet for Healthcare Providers: SeriousBroker.it  This test is not yet approved or cleared by the [place] FDA and has been authorized for detection and/or diagnosis of SARS-CoV-2 by FDA under an Emergency Use Authorization (EUA). This EUA will remain in effect (meaning this test can be used)  for the duration of the COVID-19 declaration under Section 564(b)(1) of the Act, 21 U.S.C. section 360bbb-3(b)(1), unless the authorization is terminated or revoked.  Performed at [facility], [address], [place], [place] [address]      Labs: BNP (last 3 results) Recent Labs    07/10/23 1302  BNP >4,500.0*   Basic Metabolic Panel: Recent Labs  Lab 07/16/23 0713 07/17/23 0650 07/18/23 0634 07/19/23 0641 07/20/23 0640  NA 132* 132* 128* 131* 133*  K 4.3 4.5 4.7 4.4 4.0  CL 91* 95* 93* 94* 95*  CO2 24 20* 21* 24 27  GLUCOSE 58* 92 87 83 56*  BUN 41* 55* 59* 34* 19  CREATININE 9.09* 10.30* 12.11* 8.23* 5.49*  CALCIUM 7.8* 7.4* 7.0* 7.2* 7.4*  PHOS 6.1* 7.6* 7.3* 4.9* 3.1   Liver Function Tests: Recent Labs  Lab 07/16/23 0713 07/17/23 0650 07/18/23 0634 07/19/23 0641 07/20/23 0640  ALBUMIN 2.5* 2.5* 2.5* 2.5* 2.6*   No results for input(s): "LIPASE", "AMYLASE" in the last 168 hours. No results for input(s): "AMMONIA" in the last 168 hours. CBC: Recent Labs  Lab 07/15/23 0708 07/18/23 0839  WBC 5.7 4.5  HGB 9.9* 10.5*  HCT 29.4* 32.2*  MCV 92.7 92.5  PLT 176 189   Cardiac Enzymes: No results for input(s): "CKTOTAL", "CKMB", "CKMBINDEX", "TROPONINI" in the last 168 hours. BNP: Invalid input(s): "POCBNP" CBG: Recent Labs  Lab 07/19/23 1543 07/19/23 2155 07/20/23 0634 07/20/23 0740 07/20/23 0758  GLUCAP 81 93 53* 85 77   D-Dimer No results for input(s): "DDIMER" in the last 72 hours. Hgb A1c No results for input(s): "HGBA1C" in the last 72 hours. Lipid Profile No results for input(s): "CHOL", "HDL", "LDLCALC", "TRIG", "CHOLHDL", "LDLDIRECT" in the last 72 hours. Thyroid function studies No results for input(s): "TSH", "T4TOTAL", "T3FREE", "THYROIDAB" in the last 72 hours.  Invalid input(s): "FREET3" Anemia work up No results for input(s): "VITAMINB12", "FOLATE", "FERRITIN", "TIBC", "IRON", "RETICCTPCT" in the last 72  hours. Urinalysis    Component Value Date/Time   COLORURINE YELLOW 04/12/2023 1953   APPEARANCEUR CLEAR 04/12/2023 1953   LABSPEC 1.010 04/12/2023 1953   PHURINE 9.0 (H) 04/12/2023 1953   GLUCOSEU 50 (A) 04/12/2023 1953   HGBUR SMALL (A) 04/12/2023 1953   BILIRUBINUR NEGATIVE 04/12/2023 1953   KETONESUR NEGATIVE 04/12/2023 1953   PROTEINUR 100 (A) 04/12/2023 1953   UROBILINOGEN 1.0 06/27/2010 0631   NITRITE NEGATIVE 04/12/2023 1953   LEUKOCYTESUR TRACE (A) 04/12/2023 1953   Sepsis Labs Recent Labs  Lab 07/15/23 0708 07/18/23 0839  WBC 5.7 4.5   Microbiology Recent Results (from the past 240 hours)  Resp panel by RT-PCR (RSV, Flu A&B, Covid) Anterior Nasal Swab     Status: Abnormal   Collection Time: 07/10/23  1:20 PM   Specimen: Anterior Nasal Swab  Result Value Ref Range Status   SARS Coronavirus 2 by RT PCR POSITIVE (A) NEGATIVE Final   Influenza A by PCR NEGATIVE NEGATIVE Final   Influenza B by PCR NEGATIVE NEGATIVE Final    Comment: (NOTE) The Xpert Xpress SARS-CoV-2/FLU/RSV plus assay  is intended as an aid in the diagnosis of influenza from Nasopharyngeal swab specimens and should not be used as a sole basis for treatment. Nasal washings and aspirates are unacceptable for Xpert Xpress SARS-CoV-2/FLU/RSV testing.  Fact Sheet for Patients: BloggerCourse.com  Fact Sheet for Healthcare Providers: SeriousBroker.it  This test is not yet approved or cleared by the [place] FDA and has been authorized for detection and/or diagnosis of SARS-CoV-2 by FDA under an Emergency Use Authorization (EUA). This EUA will remain in effect (meaning this test can be used) for the duration of the COVID-19 declaration under Section 564(b)(1) of the Act, 21 U.S.C. section 360bbb-3(b)(1), unless the authorization is terminated or revoked.     Resp Syncytial Virus by PCR NEGATIVE NEGATIVE Final    Comment: (NOTE) Fact Sheet for  Patients: BloggerCourse.com  Fact Sheet for Healthcare Providers: SeriousBroker.it  This test is not yet approved or cleared by the [place] FDA and has been authorized for detection and/or diagnosis of SARS-CoV-2 by FDA under an Emergency Use Authorization (EUA). This EUA will remain in effect (meaning this test can be used) for the duration of the COVID-19 declaration under Section 564(b)(1) of the Act, 21 U.S.C. section 360bbb-3(b)(1), unless the authorization is terminated or revoked.  Performed at [facility], [address], [place], [place] [address]      Patient was seen and examined on the day of discharge and was found to be in stable condition. Time coordinating discharge: 35 minutes including assessment and coordination of care, as well as examination of the patient.   SIGNED:  Noralee Stain, DO [facility] 07/20/2023, 10:09 AM

## 2023-07-20 NOTE — Progress Notes (Signed)
D/C order noted. Contacted [facility] to advise clinic of pt's d/c to snf today and that pt should resume care tomorrow. Clinic advised of snf name and that pt tested positive for covid on 2/10. HD info placed on AVS as well.   Olivia Canter Renal Navigator [phone]

## 2023-07-20 NOTE — Progress Notes (Signed)
Completed 3.25 hours of dialysis. Tolerated net fluid removal of 1.5L. VS stable, no significant events. Left lower arm AVF deaccessed. Manual pressure applied to sites until hemostasis was achieved. Sites secured with pressure dressing. Patient is alert and conversant. No signs of distress post HD. Handoff report to Israel Simpson,RN

## 2023-07-20 NOTE — Progress Notes (Signed)
[facility] Progress Note   Subjective: Seen in room, eating breakfast. No C/Os. Denies SOB.    Objective Vitals:   07/20/23 0636 07/20/23 0644 07/20/23 0749 07/20/23 1024  BP: 139/70 139/70 (!) 163/71 (!) 160/67  Pulse: 76  74 78  Resp: 17  18   Temp: 97.6 F (36.4 C)  98.7 F (37.1 C)   TempSrc: Oral  Oral   SpO2: 94%  96%   Weight:      Height:       Physical Exam General: Chronically ill appearing male in NAD Heart: S1,S2 RRR No M/R/G Lungs: CTAB Decreased in bases. No WOB. RA Abdomen: nabs, NT Extremities: No LE edema Dialysis Access: L AVG +T/B   Additional Objective Labs: Basic Metabolic Panel: Recent Labs  Lab 07/18/23 0634 07/19/23 0641 07/20/23 0640  NA 128* 131* 133*  K 4.7 4.4 4.0  CL 93* 94* 95*  CO2 21* 24 27  GLUCOSE 87 83 56*  BUN 59* 34* 19  CREATININE 12.11* 8.23* 5.49*  CALCIUM 7.0* 7.2* 7.4*  PHOS 7.3* 4.9* 3.1   Liver Function Tests: Recent Labs  Lab 07/18/23 0634 07/19/23 0641 07/20/23 0640  ALBUMIN 2.5* 2.5* 2.6*   No results for input(s): "LIPASE", "AMYLASE" in the last 168 hours. CBC: Recent Labs  Lab 07/15/23 0708 07/18/23 0839  WBC 5.7 4.5  HGB 9.9* 10.5*  HCT 29.4* 32.2*  MCV 92.7 92.5  PLT 176 189   Blood Culture    Component Value Date/Time   SDES BLOOD RIGHT FOREARM 12/30/2021 0717   SPECREQUEST  12/30/2021 0717    BOTTLES DRAWN AEROBIC AND ANAEROBIC Blood Culture adequate volume   CULT  12/30/2021 0717    NO GROWTH 5 DAYS Performed at [facility], [address], [place], [place] [address]    REPTSTATUS 01/04/2022 FINAL 12/30/2021 1914    Cardiac Enzymes: No results for input(s): "CKTOTAL", "CKMB", "CKMBINDEX", "TROPONINI" in the last 168 hours. CBG: Recent Labs  Lab 07/19/23 1543 07/19/23 2155 07/20/23 0634 07/20/23 0740 07/20/23 0758  GLUCAP 81 93 53* 85 77   Iron Studies: No results for input(s): "IRON", "TIBC", "TRANSFERRIN", "FERRITIN" in the last 72  hours. @lablastinr3 @ Studies/Results: No results found. Medications:  cefTRIAXone (ROCEPHIN)  IV Stopped (07/19/23 2150)    amLODipine  10 mg Oral QHS   aspirin  81 mg Oral Daily   atorvastatin  40 mg Oral QHS   Chlorhexidine Gluconate Cloth  6 each Topical Q0600   chlorpheniramine-HYDROcodone  5 mL Oral Q12H   cinacalcet  30 mg Oral Q supper   doxercalciferol  4 mcg Intravenous Q M,W,F-HD   doxycycline  100 mg Oral Q12H   feeding supplement  237 mL Oral BID BM   ferrous sulfate  325 mg Oral Q breakfast   guaiFENesin  600 mg Oral BID   heparin  5,000 Units Subcutaneous Q8H   hydrALAZINE  50 mg Oral Q8H   losartan  100 mg Oral Daily   polyethylene glycol  17 g Oral Daily   sevelamer carbonate  1,600 mg Oral TID WC     Dialysis Orders: MWF [facility]  3h  B400  57kg 2/2 bath  Heparin 5000   L AVG Mircera 75 mcg q2wks - recently restarted Hectorol IV qHD - last dose 06/30/23 Sensipar 30mg  with HD - last dose 06/30/23   A Confusion/weakness: Likely uremia contributing, also COVID-19. Oriented x 3 today. Looking close to normal today.  ESRD: on hemodialysis  MWF. Is off schedule this week, HD 07/21/2023 at OP center Volume: down to dry wt now, and looks euvolemic. 1.5 - 2L next HD.  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 CV-19 precautions expire 2/20 tomorrow.  Anemia of CKD: Hb 12s on admit, now down to 9-10 range. Consider esa soon if Hb drops again CKD-BMD/secondary hyperparathyroidism: CCa in range, cont IV vdra and sensipar w hd.  Phos is high, cont binders w/ meals.  Severe hypertension w/ nonadherence: getting norvasc (higher dose here) and 2 new meds losartan and hydralazine. BP's stable. Possible epididymitis - getting IV abx rocephin and doxy here per urology rec's.    P Keep him on TTS schedule this week When discharged will go back to MWF Comes off precautions on Thursday SNF placement possibly tomorrow  Medication Issues; Preferred narcotic agents for pain  control are hydromorphone, fentanyl, and methadone. Morphine should not be used.  Baclofen should be avoided Avoid oral sodium phosphate and magnesium citrate based laxatives or bowel preps   Disposition: Return to SNF today.    Rodgerick Gilliand H. Saher Davee NP-C 07/20/2023, 12:02 PM  [employer] [phone]

## 2023-07-20 NOTE — Progress Notes (Signed)
Report called to [facility] RN from [facility] all questions answered

## 2023-07-25 ENCOUNTER — Emergency Department (HOSPITAL_COMMUNITY)
Admission: EM | Admit: 2023-07-25 | Discharge: 2023-07-25 | Disposition: A | Discharge status: Home / Self Care | Payer: Medicare Other | Attending: Emergency Medicine | Admitting: Emergency Medicine

## 2023-07-25 ENCOUNTER — Other Ambulatory Visit: Payer: Self-pay

## 2023-07-25 ENCOUNTER — Encounter (HOSPITAL_COMMUNITY): Payer: Self-pay

## 2023-07-25 ENCOUNTER — Emergency Department (HOSPITAL_COMMUNITY): Payer: Medicare Other

## 2023-07-25 DIAGNOSIS — J449 Chronic obstructive pulmonary disease, unspecified: Secondary | ICD-10-CM | POA: Insufficient documentation

## 2023-07-25 DIAGNOSIS — I120 Hypertensive chronic kidney disease with stage 5 chronic kidney disease or end stage renal disease: Secondary | ICD-10-CM | POA: Diagnosis not present

## 2023-07-25 DIAGNOSIS — Z8673 Personal history of transient ischemic attack (TIA), and cerebral infarction without residual deficits: Secondary | ICD-10-CM | POA: Insufficient documentation

## 2023-07-25 DIAGNOSIS — K5909 Other constipation: Secondary | ICD-10-CM | POA: Diagnosis not present

## 2023-07-25 DIAGNOSIS — Z951 Presence of aortocoronary bypass graft: Secondary | ICD-10-CM | POA: Insufficient documentation

## 2023-07-25 DIAGNOSIS — U071 COVID-19: Secondary | ICD-10-CM | POA: Diagnosis not present

## 2023-07-25 DIAGNOSIS — Z992 Dependence on renal dialysis: Secondary | ICD-10-CM | POA: Insufficient documentation

## 2023-07-25 DIAGNOSIS — I2510 Atherosclerotic heart disease of native coronary artery without angina pectoris: Secondary | ICD-10-CM | POA: Insufficient documentation

## 2023-07-25 DIAGNOSIS — E1122 Type 2 diabetes mellitus with diabetic chronic kidney disease: Secondary | ICD-10-CM | POA: Insufficient documentation

## 2023-07-25 DIAGNOSIS — I252 Old myocardial infarction: Secondary | ICD-10-CM | POA: Diagnosis not present

## 2023-07-25 DIAGNOSIS — R1110 Vomiting, unspecified: Secondary | ICD-10-CM | POA: Diagnosis present

## 2023-07-25 DIAGNOSIS — N186 End stage renal disease: Secondary | ICD-10-CM | POA: Insufficient documentation

## 2023-07-25 DIAGNOSIS — Z7982 Long term (current) use of aspirin: Secondary | ICD-10-CM | POA: Insufficient documentation

## 2023-07-25 DIAGNOSIS — R112 Nausea with vomiting, unspecified: Secondary | ICD-10-CM

## 2023-07-25 LAB — COMPREHENSIVE METABOLIC PANEL
ALT: 25 U/L (ref 0–44)
AST: 46 U/L — ABNORMAL HIGH (ref 15–41)
Albumin: 2.7 g/dL — ABNORMAL LOW (ref 3.5–5.0)
Alkaline Phosphatase: 69 U/L (ref 38–126)
Anion gap: 11 (ref 5–15)
BUN: 23 mg/dL (ref 8–23)
CO2: 25 mmol/L (ref 22–32)
Calcium: 7.3 mg/dL — ABNORMAL LOW (ref 8.9–10.3)
Chloride: 95 mmol/L — ABNORMAL LOW (ref 98–111)
Creatinine, Ser: 6.61 mg/dL — ABNORMAL HIGH (ref 0.61–1.24)
GFR, Estimated: 8 mL/min — ABNORMAL LOW (ref >60)
Glucose, Bld: 103 mg/dL — ABNORMAL HIGH (ref 70–99)
Potassium: 4.2 mmol/L (ref 3.5–5.1)
Sodium: 131 mmol/L — ABNORMAL LOW (ref 135–145)
Total Bilirubin: 0.6 mg/dL (ref 0.0–1.2)
Total Protein: 6.5 g/dL (ref 6.5–8.1)

## 2023-07-25 LAB — CBC WITH DIFFERENTIAL/PLATELET
Abs Immature Granulocytes: 0.02 10*3/uL (ref 0.00–0.07)
Basophils Absolute: 0 10*3/uL (ref 0.0–0.1)
Basophils Relative: 1 %
Eosinophils Absolute: 0 10*3/uL (ref 0.0–0.5)
Eosinophils Relative: 1 %
HCT: 33.2 % — ABNORMAL LOW (ref 39.0–52.0)
Hemoglobin: 11.1 g/dL — ABNORMAL LOW (ref 13.0–17.0)
Immature Granulocytes: 0 %
Lymphocytes Relative: 8 %
Lymphs Abs: 0.4 10*3/uL — ABNORMAL LOW (ref 0.7–4.0)
MCH: 30.5 pg (ref 26.0–34.0)
MCHC: 33.4 g/dL (ref 30.0–36.0)
MCV: 91.2 fL (ref 80.0–100.0)
Monocytes Absolute: 0.3 10*3/uL (ref 0.1–1.0)
Monocytes Relative: 7 %
Neutro Abs: 3.8 10*3/uL (ref 1.7–7.7)
Neutrophils Relative %: 83 %
Platelets: 267 10*3/uL (ref 150–400)
RBC: 3.64 MIL/uL — ABNORMAL LOW (ref 4.22–5.81)
RDW: 13.7 % (ref 11.5–15.5)
WBC: 4.5 10*3/uL (ref 4.0–10.5)
nRBC: 0 % (ref 0.0–0.2)

## 2023-07-25 LAB — TYPE AND SCREEN
ABO/RH(D): O POS
Antibody Screen: NEGATIVE

## 2023-07-25 LAB — RESP PANEL BY RT-PCR (RSV, FLU A&B, COVID)  RVPGX2
Influenza A by PCR: NEGATIVE
Influenza B by PCR: NEGATIVE
Resp Syncytial Virus by PCR: NEGATIVE
SARS Coronavirus 2 by RT PCR: POSITIVE — AB

## 2023-07-25 LAB — HEMOGLOBIN AND HEMATOCRIT, BLOOD
HCT: 33.8 % — ABNORMAL LOW (ref 39.0–52.0)
Hemoglobin: 11.3 g/dL — ABNORMAL LOW (ref 13.0–17.0)

## 2023-07-25 LAB — LIPASE, BLOOD: Lipase: 28 U/L (ref 11–51)

## 2023-07-25 MED ORDER — ONDANSETRON 4 MG PO TBDP: 4.0000 mg | ORAL_TABLET | Freq: Three times a day (TID) | ORAL | 0 refills | Status: DC | PRN

## 2023-07-25 MED ORDER — ONDANSETRON HCL 4 MG/2ML IJ SOLN
4.0000 mg | Freq: Once | INTRAMUSCULAR | Status: AC
  Administered 2023-07-25: 4 mg via INTRAVENOUS
  Filled 2023-07-25: qty 2

## 2023-07-25 MED ORDER — PANTOPRAZOLE SODIUM 40 MG IV SOLR
40.0000 mg | Freq: Once | INTRAVENOUS | Status: AC
  Administered 2023-07-25: 40 mg via INTRAVENOUS
  Filled 2023-07-25: qty 10

## 2023-07-25 NOTE — ED Provider Notes (Signed)
 [facility]ERGENC[facility] DEPARTMENT AT [facility] Provider Note   CSN: [id number] Arrival date & time: 07/25/23  0104     History  Chief Complaint  Patient presents with   Emesis    Shawn Collins is a 73 y.o. male.  HPI     This is a 73 year old male who presents with concern for emesis.  Per EMS report he reportedly had coffee-ground emesis at his living facility.  He has had emesis for EMS and they have noted some streaks of blood.  Patient reports 1 prior episode of this "a long time ago."  It appears based on chart review that he had peptic ulcer in 2012 on EGD.  Patient denies any significant upper abdominal pain.  He is on dialysis and does not normally take NSAIDs and does not drink alcohol.  Denies any recent fevers, chest pain, shortness of breath.  Does report some upper abdominal discomfort.  No diarrhea or dark tarry stools.  Home Medications Prior to Admission medications   Medication Sig Start Date End Date Taking? Authorizing Provider  ondansetron (ZOFRAN-ODT) 4 MG disintegrating tablet Take 1 tablet (4 mg total) by mouth every 8 (eight) hours as needed. 07/25/23  Yes Saranya Harlin, Mayer Masker, MD  amLODipine (NORVASC) 10 MG tablet Take 1 tablet (10 mg total) by mouth at bedtime. 07/20/23   Noralee Stain, DO  aspirin 81 MG chewable tablet Chew 1 tablet (81 mg total) by mouth daily. 07/20/23   Noralee Stain, DO  atorvastatin (LIPITOR) 40 MG tablet Take 1 tablet (40 mg total) by mouth at bedtime. 07/20/23   Noralee Stain, DO  hydrALAZINE (APRESOLINE) 50 MG tablet Take 1 tablet (50 mg total) by mouth every 8 (eight) hours. 07/20/23   Noralee Stain, DO  losartan (COZAAR) 100 MG tablet Take 1 tablet (100 mg total) by mouth daily. 07/20/23   Noralee Stain, DO  nitroGLYCERIN (NITROSTAT) 0.4 MG SL tablet Place 0.4 mg under the tongue every 5 (five) minutes as needed for chest pain.    [provider]      Allergies    Patient has no known allergies.    Review of  Systems   Review of Systems  Constitutional:  Negative for fever.  Respiratory:  Negative for shortness of breath.   Cardiovascular:  Negative for chest pain.  Gastrointestinal:  Positive for abdominal pain, nausea and vomiting. Negative for constipation and diarrhea.  All other systems reviewed and are negative.   Physical Exam Updated Vital Signs BP (!) 171/80   Pulse 79   Temp 98.5 F (36.9 C) (Oral)   Resp 13   Ht 1.778 m (5\' 10" )   Wt 63 kg   SpO2 96%   BMI 19.94 kg/m  Physical Exam Vitals and nursing note reviewed.  Constitutional:      Appearance: He is well-developed.     Comments: Chronically ill-appearing but nontoxic, actively vomiting  HENT:     Head: Normocephalic and atraumatic.     Mouth/Throat:     Mouth: Mucous membranes are dry.  Eyes:     Pupils: Pupils are equal, round, and reactive to light.  Cardiovascular:     Rate and Rhythm: Normal rate and regular rhythm.     Heart sounds: Normal heart sounds. No murmur heard. Pulmonary:     Effort: Pulmonary effort is normal. No respiratory distress.     Breath sounds: Normal breath sounds. No wheezing.  Abdominal:     General: Bowel sounds are normal.  Palpations: Abdomen is soft.     Tenderness: There is abdominal tenderness. There is no guarding or rebound.     Comments: Epigastric tenderness without signs of peritonitis  Musculoskeletal:     Cervical back: Neck supple.  Lymphadenopathy:     Cervical: No cervical adenopathy.  Skin:    General: Skin is warm and dry.  Neurological:     Mental Status: He is alert and oriented to person, place, and time.  Psychiatric:        Mood and Affect: Mood normal.     ED Results / Procedures / Treatments   Labs (all labs ordered are listed, but only abnormal results are displayed) Labs Reviewed  RESP PANEL BY RT-PCR (RSV, FLU A&B, COVID)  RVPGX2 - Abnormal; Notable for the following components:      Result Value   SARS Coronavirus 2 by RT PCR POSITIVE  (*)    All other components within normal limits  CBC WITH DIFFERENTIAL/PLATELET - Abnormal; Notable for the following components:   RBC 3.64 (*)    Hemoglobin 11.1 (*)    HCT 33.2 (*)    Lymphs Abs 0.4 (*)    All other components within normal limits  COMPREHENSIVE METABOLIC PANEL - Abnormal; Notable for the following components:   Sodium 131 (*)    Chloride 95 (*)    Glucose, Bld 103 (*)    Creatinine, Ser 6.61 (*)    Calcium 7.3 (*)    Albumin 2.7 (*)    AST 46 (*)    GFR, Estimated 8 (*)    All other components within normal limits  HEMOGLOBIN AND HEMATOCRIT, BLOOD - Abnormal; Notable for the following components:   Hemoglobin 11.3 (*)    HCT 33.8 (*)    All other components within normal limits  LIPASE, BLOOD  TYPE AND SCREEN    EKG EKG Interpretation Date/Time:  Tuesday July 25 2023 02:58:09 EST Ventricular Rate:  80 PR Interval:  174 QRS Duration:  89 QT Interval:  410 QTC Calculation: 473 R Axis:   86  Text Interpretation: Sinus rhythm Borderline right axis deviation Borderline low voltage, extremity leads Confirmed by Ross Marcus ([id number]) on 07/25/2023 3:58:17 AM  Radiology CT ABDOMEN PELVIS WO CONTRAST Result Date: 07/25/2023 CLINICAL DATA:  Abdominal pain EXAM: CT ABDOMEN AND PELVIS WITHOUT CONTRAST TECHNIQUE: Multidetector CT imaging of the abdomen and pelvis was performed following the standard protocol without IV contrast. RADIATION DOSE REDUCTION: This exam was performed according to the departmental dose-optimization program which includes automated exposure control, adjustment of the mA and/or kV according to patient size and/or use of iterative reconstruction technique. COMPARISON:  03/31/2023 FINDINGS: Lower chest: Small bilateral pleural effusions, right greater than left. Dependent atelectasis in the right lower lobe. Coronary artery and aortic atherosclerosis. Hepatobiliary: No focal hepatic abnormality. Gallbladder unremarkable. Pancreas: No focal  abnormality or ductal dilatation. Spleen: No focal abnormality.  Normal size. Adrenals/Urinary Tract: Adrenal glands normal. Bilateral nonobstructing nephrolithiasis. No hydronephrosis. Urinary bladder unremarkable. Stomach/Bowel: Large stool burden throughout the colon compatible with constipation. Large stool burden in the rectum could reflect fecal impaction. Stomach and small bowel decompressed. Vascular/Lymphatic: Aortoiliac atherosclerosis. No evidence of aneurysm or adenopathy. Reproductive: Mildly prominent prostate Other: No free fluid or free air. Musculoskeletal: No acute bony abnormality. IMPRESSION: Small bilateral pleural effusions, right greater than left. Coronary artery disease, aortic atherosclerosis. Bilateral nonobstructing nephrolithiasis.  No hydronephrosis. Large stool burden in the rectum and colon could reflect fecal impaction and constipation. Prominent prostate. Electronically  Signed   By: Charlett Nose M.D.   On: 07/25/2023 03:29    Procedures Procedures    Medications Ordered in ED Medications  ondansetron (ZOFRAN) injection 4 mg (4 mg Intravenous Given 07/25/23 0130)  pantoprazole (PROTONIX) injection 40 mg (40 mg Intravenous Given 07/25/23 0126)    ED Course/ Medical Decision Making/ A&P Clinical Course as of 07/25/23 0616  Tue Jul 25, 2023  0615 Per nursing, patient is tolerating fluids.  Hemoglobin is stable at 11.3.  He has not had any obvious hematemesis or coffee-ground emesis here in the emergency department.  COVID testing remains positive. [CH]    Clinical Course User Index [CH] Jovannie Ulibarri, Mayer Masker, MD                                 Medical Decision Making Amount and/or Complexity of Data Reviewed Labs: ordered. Radiology: ordered.  Risk Prescription drug management.   This patient presents to the ED for concern of vomiting, questionable bloody emesis, this involves an extensive number of treatment options, and is a complaint that carries with it a  high risk of complications and morbidity.  I considered the following differential and admission for this acute, potentially life threatening condition.  The differential diagnosis includes gastritis, gastroenteritis, peptic ulcer, varices  MDM:    This is a 73 year old male who presents with reported dark coffee ground emesis and blood in emesis.  He is nontoxic-appearing.  Vital signs notable for hypertension.  No obvious blood in his emesis.  He is actively vomiting on my evaluation.  It appears nonbilious as well.  Patient was given nausea medication and IV Protonix.  Labs obtained.  Hemoglobin is 11.1.  CMP is close to the patient's baseline.  Patient with end-stage renal disease and on dialysis.  Repeat hemoglobin is 11.3.  No visualized hematemesis while in the emergency department for greater than 5 hours.  Was able to p.o. challenge.  I did get a CT scan that did not show any intra-abdominal pathology.  Does show Concern for Constipation.  Will Start on a Bowel Regimen.  Will Discharge Back to His Living Facility.  Of Note, COVID Test Remains Positive.  Likely Ongoing Positive from Prior Evaluation Approximately 15 Days Ago.  (Labs, imaging, consults)  Labs: I Ordered, and personally interpreted labs.  The pertinent results include: CBC, CMP, lipase, type and screen  Imaging Studies ordered: I ordered imaging studies including CT abdomen pelvis I independently visualized and interpreted imaging. I agree with the radiologist interpretation  Additional history obtained from chart review.  External records from outside source obtained and reviewed including prior evaluations  Cardiac Monitoring: The patient was maintained on a cardiac monitor.  If on the cardiac monitor, I personally viewed and interpreted the cardiac monitored which showed an underlying rhythm of: Sinus  Reevaluation: After the interventions noted above, I reevaluated the patient and found that they have  :improved  Social Determinants of Health:  end-stage renal disease Disposition: Discharge  Co morbidities that complicate the patient evaluation  Past Medical History:  Diagnosis Date   CAD (coronary artery disease), native coronary artery    s/p NSTEMI with cath showing severe 3VD s/p  CABG x 3 with LIMA to LAD, SVG to OM1/OM2 and SVG D1 by Dr. Tyrone Sage on 10/26/16.   COPD (chronic obstructive pulmonary disease) (HCC) 09/10/2014   CVA (cerebral vascular accident) (HCC) 12/18/2021   Diabetes mellitus without complication (  HCC)    Type II   ESRD (end stage renal disease) (HCC)    Hyperlipidemia LDL goal <70 04/17/2017   Hypertension      Medicines Meds ordered this encounter  Medications   ondansetron (ZOFRAN) injection 4 mg   pantoprazole (PROTONIX) injection 40 mg   ondansetron (ZOFRAN-ODT) 4 MG disintegrating tablet    Sig: Take 1 tablet (4 mg total) by mouth every 8 (eight) hours as needed.    Dispense:  20 tablet    Refill:  0    I have reviewed the patients home medicines and have made adjustments as needed  Problem List / ED Course: Problem List Items Addressed This Visit       Digestive   Nausea & vomiting - Primary   Other Visit Diagnoses       Other constipation                       Final Clinical Impression(s) / ED Diagnoses Final diagnoses:  Nausea and vomiting, unspecified vomiting type  Other constipation    Rx / DC Orders ED Discharge Orders          Ordered    ondansetron (ZOFRAN-ODT) 4 MG disintegrating tablet  Every 8 hours PRN        07/25/23 0614              Shon Baton, MD 07/25/23 [phone]

## 2023-07-25 NOTE — ED Notes (Signed)
 Pt tolerated PO fluids with no vomiting or nausea.

## 2023-07-25 NOTE — ED Triage Notes (Signed)
 Pt [facility] from [facility]. Staff at facility stated pt was vomiting coffee ground emesis. EMS stated he has vomited with them with no coffee ground emesis but visible blood. Pt on ABX  Bp 140/60 Hr 80 94% Cbg 109

## 2023-07-25 NOTE — Discharge Instructions (Addendum)
 You were seen today for nausea and vomiting.  Your workup today is reassuring.  Take Zofran as needed.  Your CT scan did show significant constipation.  Start MiraLAX 1 capful daily.

## 2023-08-03 ENCOUNTER — Ambulatory Visit (INDEPENDENT_AMBULATORY_CARE_PROVIDER_SITE_OTHER): Payer: Medicare Other | Admitting: Podiatry

## 2023-08-03 ENCOUNTER — Encounter: Payer: Self-pay | Admitting: Podiatry

## 2023-08-03 DIAGNOSIS — E1151 Type 2 diabetes mellitus with diabetic peripheral angiopathy without gangrene: Secondary | ICD-10-CM | POA: Diagnosis not present

## 2023-08-03 DIAGNOSIS — M79674 Pain in right toe(s): Secondary | ICD-10-CM

## 2023-08-03 DIAGNOSIS — M79675 Pain in left toe(s): Secondary | ICD-10-CM | POA: Diagnosis not present

## 2023-08-03 DIAGNOSIS — L84 Corns and callosities: Secondary | ICD-10-CM

## 2023-08-03 DIAGNOSIS — B351 Tinea unguium: Secondary | ICD-10-CM

## 2023-08-03 NOTE — Progress Notes (Signed)
 Subjective:   Patient ID: Shawn Collins, male   DOB: 73 y.o.   MRN: [id number]   HPI Chief Complaint  Patient presents with   [facility]     RM#11 DFC/CALLUSES-right bottom side of foot has callus growing out of foot.   73 year old male presents the office with above concerns.  He states he has a callus on the bottom of his foot which is growing out causing pain.  No recent injury or treatment.  Denies stepping on any foreign objects.  Nails are elongated he cannot trim himself may cause discomfort.   Review of Systems  All other systems reviewed and are negative.  Past Medical History:  Diagnosis Date   CAD (coronary artery disease), native coronary artery    s/p NSTEMI with cath showing severe 3VD s/p  CABG x 3 with LIMA to LAD, SVG to OM1/OM2 and SVG D1 by Dr. Tyrone Sage on 10/26/16.   COPD (chronic obstructive pulmonary disease) (HCC) 09/10/2014   CVA (cerebral vascular accident) (HCC) 12/18/2021   Diabetes mellitus without complication (HCC)    Type II   ESRD (end stage renal disease) (HCC)    Hyperlipidemia LDL goal <70 04/17/2017   Hypertension     Past Surgical History:  Procedure Laterality Date   AV FISTULA PLACEMENT Left 12/21/2021   Procedure: INSERTION OF LEFT ARM ARTERIOVENOUS (AV) GORE-TEX GRAFT;  Surgeon: Chuck Hint, MD;  Location: [facility] OR;  Service: Vascular;  Laterality: Left;   CORONARY ARTERY BYPASS GRAFT N/A 10/26/2016   Procedure: CORONARY ARTERY BYPASS GRAFTING (CABG) x 4 USING LEFT INTERNAL MAMMARY ARTERY TO LAD AND ENDOSCOPICALLY HARVESTED GREATER SAPHENOUS VEIN TO OM 1, 2 AND TO DIAG;  Surgeon: Delight Ovens, MD;  Location: [facility] OR;  Service: Open Heart Surgery;  Laterality: N/A;   ENDOVEIN HARVEST OF GREATER SAPHENOUS VEIN Right 10/26/2016   Procedure: ENDOVEIN HARVEST OF GREATER SAPHENOUS VEIN;  Surgeon: Delight Ovens, MD;  Location: [facility] OR;  Service: Open Heart Surgery;  Laterality: Right;   IR THORACENTESIS ASP PLEURAL SPACE W/IMG GUIDE  11/03/2016    LEFT HEART CATH AND CORONARY ANGIOGRAPHY N/A 10/21/2016   Procedure: Left Heart Cath and Coronary Angiography;  Surgeon: Swaziland, Peter M, MD;  Location: [facility] INVASIVE CV LAB;  Service: Cardiovascular;  Laterality: N/A;   TEE WITHOUT CARDIOVERSION N/A 10/26/2016   Procedure: TRANSESOPHAGEAL ECHOCARDIOGRAM (TEE);  Surgeon: Delight Ovens, MD;  Location: [facility] OR;  Service: Open Heart Surgery;  Laterality: N/A;     Current Outpatient Medications:    amLODipine (NORVASC) 10 MG tablet, Take 1 tablet (10 mg total) by mouth at bedtime., Disp: , Rfl:    aspirin 81 MG chewable tablet, Chew 1 tablet (81 mg total) by mouth daily., Disp: , Rfl:    atorvastatin (LIPITOR) 40 MG tablet, Take 1 tablet (40 mg total) by mouth at bedtime., Disp: , Rfl:    hydrALAZINE (APRESOLINE) 50 MG tablet, Take 1 tablet (50 mg total) by mouth every 8 (eight) hours., Disp: , Rfl:    losartan (COZAAR) 100 MG tablet, Take 1 tablet (100 mg total) by mouth daily., Disp: , Rfl:    nitroGLYCERIN (NITROSTAT) 0.4 MG SL tablet, Place 0.4 mg under the tongue every 5 (five) minutes as needed for chest pain., Disp: , Rfl:    ondansetron (ZOFRAN-ODT) 4 MG disintegrating tablet, Take 1 tablet (4 mg total) by mouth every 8 (eight) hours as needed., Disp: 20 tablet, Rfl: 0  No Known Allergies  Objective:  Physical Exam  General: AAO x3, NAD  Dermatological: Nails are hypertrophic, dystrophic, brittle, discolored, elongated 10. No surrounding redness or drainage. Tenderness nails 1-5 bilaterally.  Hyperkeratotic lesion submetatarsal without any underlying ulceration, drainage or any signs of infection.  No open lesions are noted.   Vascular: Dorsalis Pedis artery and Posterior Tibial artery pedal pulses are 1/4 bilateral with immedate capillary fill time.  There is no pain with calf compression, swelling, warmth, erythema.   Neruologic: Grossly intact via light touch bilateral.  Musculoskeletal: Digital contractures  present.      Assessment:   Symptomatic onychomycosis, preulcerative callus     Plan:  -Treatment options discussed including all alternatives, risks, and complications -Etiology of symptoms were discussed -Nails debrided 10 without complications or bleeding. -Separately appear to have lesion x 1 without any complications or bleeding.  Discussed moisturizer, offloading. -Currently no ulcerations or signs of complication.  Continue to monitor -Daily foot inspection -Follow-up in 3 months or sooner if any problems arise. In the meantime, encouraged to call the office with any questions, concerns, change in symptoms.   Ovid Curd, DPM

## 2023-08-10 ENCOUNTER — Encounter (HOSPITAL_COMMUNITY): Admission: RE | Payer: Self-pay | Source: Home / Self Care

## 2023-08-10 ENCOUNTER — Ambulatory Visit (HOSPITAL_COMMUNITY): Admission: RE | Admit: 2023-08-10 | Source: Home / Self Care | Admitting: Internal Medicine

## 2023-08-10 SURGERY — A/V FISTULAGRAM
Anesthesia: LOCAL

## 2023-08-17 ENCOUNTER — Other Ambulatory Visit: Payer: Self-pay

## 2023-08-17 ENCOUNTER — Ambulatory Visit (HOSPITAL_COMMUNITY)
Admission: RE | Admit: 2023-08-17 | Discharge: 2023-08-17 | Disposition: A | Discharge status: Skilled Nursing Facility | Attending: Internal Medicine | Admitting: Internal Medicine

## 2023-08-17 ENCOUNTER — Encounter (HOSPITAL_COMMUNITY)
Admission: RE | Disposition: A | Discharge status: Skilled Nursing Facility | Payer: Self-pay | Source: Home / Self Care | Attending: Internal Medicine

## 2023-08-17 DIAGNOSIS — Z8249 Family history of ischemic heart disease and other diseases of the circulatory system: Secondary | ICD-10-CM | POA: Diagnosis not present

## 2023-08-17 DIAGNOSIS — Z951 Presence of aortocoronary bypass graft: Secondary | ICD-10-CM | POA: Diagnosis not present

## 2023-08-17 DIAGNOSIS — Z833 Family history of diabetes mellitus: Secondary | ICD-10-CM | POA: Diagnosis not present

## 2023-08-17 DIAGNOSIS — Z992 Dependence on renal dialysis: Secondary | ICD-10-CM | POA: Insufficient documentation

## 2023-08-17 DIAGNOSIS — E1122 Type 2 diabetes mellitus with diabetic chronic kidney disease: Secondary | ICD-10-CM | POA: Insufficient documentation

## 2023-08-17 DIAGNOSIS — I2510 Atherosclerotic heart disease of native coronary artery without angina pectoris: Secondary | ICD-10-CM | POA: Insufficient documentation

## 2023-08-17 DIAGNOSIS — Z8673 Personal history of transient ischemic attack (TIA), and cerebral infarction without residual deficits: Secondary | ICD-10-CM | POA: Insufficient documentation

## 2023-08-17 DIAGNOSIS — N186 End stage renal disease: Secondary | ICD-10-CM | POA: Insufficient documentation

## 2023-08-17 DIAGNOSIS — I120 Hypertensive chronic kidney disease with stage 5 chronic kidney disease or end stage renal disease: Secondary | ICD-10-CM | POA: Diagnosis not present

## 2023-08-17 DIAGNOSIS — Z823 Family history of stroke: Secondary | ICD-10-CM | POA: Diagnosis not present

## 2023-08-17 DIAGNOSIS — Z87891 Personal history of nicotine dependence: Secondary | ICD-10-CM | POA: Diagnosis not present

## 2023-08-17 DIAGNOSIS — I871 Compression of vein: Secondary | ICD-10-CM | POA: Diagnosis present

## 2023-08-17 DIAGNOSIS — J449 Chronic obstructive pulmonary disease, unspecified: Secondary | ICD-10-CM | POA: Diagnosis not present

## 2023-08-17 HISTORY — PX: A/V SHUNT INTERVENTION: CATH118220

## 2023-08-17 SURGERY — A/V SHUNT INTERVENTION
Anesthesia: LOCAL

## 2023-08-17 MED ORDER — LIDOCAINE HCL (PF) 1 % IJ SOLN
INTRAMUSCULAR | Status: DC | PRN
  Administered 2023-08-17: 4 mL via SUBCUTANEOUS

## 2023-08-17 MED ORDER — HEPARIN (PORCINE) IN NACL 1000-0.9 UT/500ML-% IV SOLN
INTRAVENOUS | Status: DC | PRN
  Administered 2023-08-17: 500 mL

## 2023-08-17 MED ORDER — FENTANYL CITRATE (PF) 100 MCG/2ML IJ SOLN
INTRAMUSCULAR | Status: AC
  Filled 2023-08-17: qty 2

## 2023-08-17 MED ORDER — SODIUM CHLORIDE 0.9 % IV SOLN: INTRAVENOUS | Status: DC

## 2023-08-17 MED ORDER — FENTANYL CITRATE (PF) 100 MCG/2ML IJ SOLN
INTRAMUSCULAR | Status: DC | PRN
  Administered 2023-08-17: 50 ug via INTRAVENOUS

## 2023-08-17 MED ORDER — IODIXANOL 320 MG/ML IV SOLN
INTRAVENOUS | Status: DC | PRN
Start: 2023-08-17 — End: 2023-08-17
  Administered 2023-08-17: 15 mL via INTRAVENOUS

## 2023-08-17 MED ORDER — MIDAZOLAM HCL 2 MG/2ML IJ SOLN
INTRAMUSCULAR | Status: DC | PRN
  Administered 2023-08-17: 1 mg via INTRAVENOUS

## 2023-08-17 MED ORDER — ACETAMINOPHEN 325 MG PO TABS: 650.0000 mg | ORAL_TABLET | ORAL | Status: DC | PRN

## 2023-08-17 MED ORDER — ONDANSETRON HCL 4 MG/2ML IJ SOLN: 4.0000 mg | Freq: Four times a day (QID) | INTRAMUSCULAR | Status: DC | PRN

## 2023-08-17 MED ORDER — MIDAZOLAM HCL 2 MG/2ML IJ SOLN
INTRAMUSCULAR | Status: AC
  Filled 2023-08-17: qty 2

## 2023-08-17 MED ORDER — LIDOCAINE HCL (PF) 1 % IJ SOLN
INTRAMUSCULAR | Status: AC
  Filled 2023-08-17: qty 30

## 2023-08-17 SURGICAL SUPPLY — 5 items
COVER DOME SNAP 22 D (MISCELLANEOUS) ×1 IMPLANT
GUIDEWIRE ANGLED .035 180CM (WIRE) IMPLANT
KIT MICROPUNCTURE NIT STIFF (SHEATH) IMPLANT
SHEATH PINNACLE R/O II 6F 4CM (SHEATH) IMPLANT
TRAY PV CATH (CUSTOM PROCEDURE TRAY) ×1 IMPLANT

## 2023-08-17 NOTE — Op Note (Signed)
 Patient presents for evaluation of L FA loop AVG due to decreased flows and recent infiltration.  AVG was placed in 11/2021 and he notes no prior interventions - had been referred to [facility] but wasn't NPO and didn't reschedule.  On exam the AVG (AT) is pulsatile with a decent bruit which becomes high pitched in the upper arm past the elbow.    Summary:  1) Patent forearm loop AVG and VA. 2)        Sclerotic upper arm brachial vein mid upper arm with outflow through a collateral to the basilic vein.  3)        Widely patent axillary vein and left central vessels.    Description of procedure: The right forearm arm was prepped and draped in the usual fashion. The right forearm loop AVG was cannulated (40981) in the arterial limb of the graft in an antegrade direction with an 21G micropuncture needle and then a 6 Fr sheath was inserted by guidewire exchange technique. The angiogram revealed a patent body of the graft and VA.  The outflow is through the brachial vein which becomes sclerotic in the mid upper arm.  Outflow goes through a collateral over to the basilic vein and collaterals which drain through widely patent axillary vein and central vessels.  There was no stenosis that was amenable to percutaneous angioplasty.  Hemostasis: A 3-0 ethilon purse string suture was placed at the cannulation site on removal of the sheath.  Sedation: 1mg  Versed, Fentanyl.  Sedation time: 7 minutes  Contrast. 15 mL  Monitoring: Because of the patient's comorbid conditions and sedation during the procedure, continuous EKG monitoring and O2 saturation monitoring was performed throughout the procedure by the RN. There were no abnormal arrhythmias encountered.  Complications: None.   Diagnoses: I87.1 Stricture of vein  N18.6 ESRD Z99.2 dependence on dialysis  Procedure Coding:  36901 Cannulation and angiogram of graft [id number] Contrast  Recommendations:  1. Continue to cannulate the graft with 15G needles.   2. Refer the surgeon for intervention, possible new access.  3.  If the AVG thromboses would need bridging catheter. 4. Remove the suture next treatment.   Discharge: The patient was discharged home in stable condition. The patient was given education regarding the care of the dialysis access AVF and specific instructions in case of any problems.

## 2023-08-17 NOTE — H&P (Signed)
 Shawn Collins  Vascular Access Procedure H&P  Reason for Consultation: low AF AV graft Requesting Provider: Dr. Marisue Humble  HPI: Shawn Collins is an 73 y.o. male with ESRD on HD, COPD, H/o CVA, HL, HTN CAD s/p CABG who presents for evaluation of HD access dysfunction.   Has L forearm loop AVG created in 11/2021.  Says no h/o percutaneous intervention.  Recent had an infiltration and noted to have declining AF.  He's NPO with a driver and has no h/o contrast allergy.    PMH: Past Medical History:  Diagnosis Date   CAD (coronary artery disease), native coronary artery    s/p NSTEMI with cath showing severe 3VD s/p  CABG x 3 with LIMA to LAD, SVG to OM1/OM2 and SVG D1 by Dr. Tyrone Sage on 10/26/16.   COPD (chronic obstructive pulmonary disease) (HCC) 09/10/2014   CVA (cerebral vascular accident) (HCC) 12/18/2021   Diabetes mellitus without complication (HCC)    Type II   ESRD (end stage renal disease) (HCC)    Hyperlipidemia LDL goal <70 04/17/2017   Hypertension    PSH: Past Surgical History:  Procedure Laterality Date   AV FISTULA PLACEMENT Left 12/21/2021   Procedure: INSERTION OF LEFT ARM ARTERIOVENOUS (AV) GORE-TEX GRAFT;  Surgeon: Chuck Hint, MD;  Location: [facility] OR;  Service: Vascular;  Laterality: Left;   CORONARY ARTERY BYPASS GRAFT N/A 10/26/2016   Procedure: CORONARY ARTERY BYPASS GRAFTING (CABG) x 4 USING LEFT INTERNAL MAMMARY ARTERY TO LAD AND ENDOSCOPICALLY HARVESTED GREATER SAPHENOUS VEIN TO OM 1, 2 AND TO DIAG;  Surgeon: Delight Ovens, MD;  Location[facility];  Service: Open Heart Surgery;  Laterality: N/A;   ENDOVEIN HARVEST OF GREATER SAPHENOUS VEIN Right 10/26/2016   Procedure: ENDOVEIN HARVEST OF GREATER SAPHENOUS VEIN;  Surgeon: Delight Ovens, MD;  Location: [facility] OR;  Service: Open Heart Surgery;  Laterality: Right;   IR THORACENTESIS ASP PLEURAL SPACE W/IMG GUIDE  11/03/2016   LEFT HEART CATH AND CORONARY ANGIOGRAPHY N/A 10/21/2016   Procedure:  Left Heart Cath and Coronary Angiography;  Surgeon: Swazil[place], Peter M, MD;  Location: [facility] INVASIVE CV LAB;  Service: Cardiovascular;  Laterality: N/A;   TEE WITHOUT CARDIOVERSION N/A 10/26/2016   Procedure: TRANSESOPHAGEAL ECHOCARDIOGRAM (TEE);  Surgeon: Delight Ovens, MD;  Location: [facility] OR;  Service: Open Heart Surgery;  Laterality: N/A;   Past Medical History:  Diagnosis Date   CAD (coronary artery disease), native coronary artery    s/p NSTEMI with cath showing severe 3VD s/p  CABG x 3 with LIMA to LAD, SVG to OM1/OM2 and SVG D1 by Dr. Tyrone Sage on 10/26/16.   COPD (chronic obstructive pulmonary disease) (HCC) 09/10/2014   CVA (cerebral vascular accident) (HCC) 12/18/2021   Diabetes mellitus without complication (HCC)    Type II   ESRD (end stage renal disease) (HCC)    Hyperlipidemia LDL goal <70 04/17/2017   Hypertension     Medications:  I have reviewed the patient's current medications.  Medications Prior to Admission  Medication Sig Dispense Refill   amLODipine (NORVASC) 10 MG tablet Take 1 tablet (10 mg total) by mouth at bedtime.     aspirin 81 MG chewable tablet Chew 1 tablet (81 mg total) by mouth daily.     atorvastatin (LIPITOR) 40 MG tablet Take 1 tablet (40 mg total) by mouth at bedtime.     hydrALAZINE (APRESOLINE) 50 MG tablet Take 1 tablet (50 mg total) by mouth every 8 (eight) hours.  losartan (COZAAR) 100 MG tablet Take 1 tablet (100 mg total) by mouth daily.     Methoxy PEG-Epoetin Beta (MIRCERA IJ) Mircera     nitroGLYCERIN (NITROSTAT) 0.4 MG SL tablet Place 0.4 mg under the tongue every 5 (five) minutes as needed for chest pain.     ondansetron (ZOFRAN-ODT) 4 MG disintegrating tablet Take 1 tablet (4 mg total) by mouth every 8 (eight) hours as needed. (Patient not taking: Reported on 08/09/2023) 20 tablet 0    ALLERGIES:  No Known Allergies  FAM HX: Family History  Problem Relation Age of Onset   Diabetes Mellitus II Mother    Stroke Father     Hypertension Father    Diabetes Mellitus II Sister     Social History:   reports that he quit smoking about 53 years ago. His smoking use included cigarettes. He started smoking about 61 years ago. He has a 2 pack-year smoking history. He has never used smokeless tobacco. He reports that he does not currently use alcohol. He reports that he does not currently use drugs.  ROS: 12 system relevant ROS neg except per HPI above  Blood pressure (!) 173/74, pulse 73, temperature 97.7 F (36.5 C), temperature source Oral, resp. rate 12, SpO2 96%. PHYSICAL EXAM: Gen: thin frail appearing man who  is comfortable  Eyes: EOMI ENT: poor dentition, class 3 airway CV:  RRR, II/VI SEM Lungs: clear ant on RA Extr:  L forearm Loop AVG pulsatile with high pitched bruit in upper arm past elbow Neuro: conversant, poor historian about details but oriented    No results found for this or any previous visit (from the past 48 hours).  No results found.  Assessment/PlanMark Collins is an 73 y.o. male with ESRD on HD, COPD, H/o CVA, HL, HTN CAD s/p CABG who presents for evaluation of HD access dysfunction.   **ESRD with HD access dysfunction:  AVG with evidence of outflow stenosis.  Consented for percutaneous angiogram and angioplasty with local and conscious sedation.    Shawn Collins 08/17/2023, 8:11 AM

## 2023-08-17 NOTE — Discharge Instructions (Addendum)
 General care instructions: - Do not drive or operate heavy machinery for 24hrs - Avoid making any important decisions for the remainder of the day. - You should be able to eat, drink, and resume your normal medications. - Avoid any strenuous activity for the remainder of the day. Potential complications: - Your hand is more cold or numb than usual. - You are bleeding at the site and it will not stop with direct pressure. If it was a declot expect some oozing at the site. Avoid extreme pressure to the site. - You have a change in the bruit and /or thrill in your fistula or graft. - You have a fever, swelling, see redness or feel heat at or near the puncture site. Medication instructions: - Continue routine medications unless otherwise instructed. 4. Please have your sutures removed at your next scheduled dialysis treatment.   X-ray of Your Dialysis Access (Dialysis Fistulogram): What to Know After After having an X-ray of your dialysis access, also called a dialysis fistulogram, it's common to have sleepiness and tiredness. It's also common to have: A small amount of discomfort in the area where the soft tube, called a catheter, was put in. A small amount of bruising around the fistula. A fistula is a connection between an artery and a vein. Follow these instructions at home: Puncture site care  Take care of your puncture site as told. Make sure you: Wash your hands with soap and water for at least 20 seconds before and after you change your bandage. If you can't use soap and water, use hand sanitizer. Change your bandage. Leave stitches or skin glue alone. Leave tape strips alone unless you're told to take them off. You may trim the edges of the tape strips if they curl up. Check your puncture area every day for signs of infection. Check for: More redness, swelling, or pain. Fluid or blood. Warmth. Pus or a bad smell. Activity Rest as much as you can. If you were given a sedative,  do not drive or use machines until you're told it's safe. A sedative can make you sleepy. Do not lift anything heavy until you're told it's OK. Do not do anything that takes a lot of effort with your arm for the rest of the day. Avoid chores like vacuuming. Ask what things are safe for you to do at home. Ask when you can go back to work or school. Safety To prevent damage to your graft or fistula: Do not wear tight-fitting clothes or jewelry on the arm or leg that has your graft or fistula. Tell all your health care providers that you have a dialysis fistula or graft. Do not have blood draws, IVs, or blood pressure readings in the arm that has your fistula or graft. Do not get flu shots or other shots in the arm with your fistula or graft. General instructions Take your medicines only as told. Do not take baths, swim, or use a hot tub until you're told it's OK. Ask if you can shower. Check your dialysis fistula closely. Check to make sure that you can feel a vibration or buzz, also called a thrill, when you put your fingers over the fistula. Keep all follow-up visits. Your provider will need to check on how you're doing. Your provider may give you more instructions. Make sure you know what you can and can't do. Contact a health care provider if: You have any signs of infection at your catheter site. You have a fever or  chills. Get help right away if: You have bleeding from the vascular access site that won't stop. You feel weak. You have trouble balancing. You have trouble moving your arms or legs. You have problems talking or seeing. You can no longer feel a vibration or buzz when you put your fingers over your fistula. The arm that was used for the procedure swells or becomes: Painful. Cold. Blue. Pale white. You have chest pain or shortness of breath. These symptoms may be an emergency. Call 911 right away. Do not wait to see if the symptoms will go away. Do not drive yourself to  the hospital This information is not intended to replace advice given to you by your health care provider. Make sure you discuss any questions you have with your health care provider. Document Revised: 01/09/2023 Document Reviewed: 01/09/2023 Elsevier Patient Education  2024 [employer].

## 2023-08-18 ENCOUNTER — Encounter (HOSPITAL_COMMUNITY): Payer: Self-pay | Admitting: Internal Medicine

## 2023-09-07 ENCOUNTER — Ambulatory Visit (INDEPENDENT_AMBULATORY_CARE_PROVIDER_SITE_OTHER): Admitting: Surgery

## 2023-09-07 ENCOUNTER — Encounter: Payer: Self-pay | Admitting: Surgery

## 2023-09-07 ENCOUNTER — Telehealth: Payer: Self-pay

## 2023-09-07 VITALS — BP 178/76 | HR 66 | Temp 97.8°F | Resp 20 | Ht 70.0 in | Wt 128.0 lb

## 2023-09-07 DIAGNOSIS — N186 End stage renal disease: Secondary | ICD-10-CM | POA: Diagnosis not present

## 2023-09-07 DIAGNOSIS — Z992 Dependence on renal dialysis: Secondary | ICD-10-CM

## 2023-09-07 NOTE — Progress Notes (Signed)
 [facility]  Patient name: Shawn Collins MRN: [id number] DOB: 12-11-1950 Sex: male   REASON FOR VISIT:    Follow-up  HISOTRY OF PRESENT ILLNESS:    Shawn Collins is a 73 y.o. male who is status post left forearm AV Gore-Tex graft (4-7 PTFE) by Dr. Edilia Bo on 12/21/2021.  He recently underwent a shuntogram by Dr. Glenna Fellows and was found to have a sclerotic vein.  No intervention was performed.  He does state that he has some bleeding after dialysis.  There are some dry eschars on the lateral side of his graft that he says have been there for few months.   PAST MEDICAL HISTORY:   Past Medical History:  Diagnosis Date   CAD (coronary artery disease), native coronary artery    s/p NSTEMI with cath showing severe 3VD s/p  CABG x 3 with LIMA to LAD, SVG to OM1/OM2 and SVG D1 by Dr. Tyrone Sage on 10/26/16.   COPD (chronic obstructive pulmonary disease) (HCC) 09/10/2014   CVA (cerebral vascular accident) (HCC) 12/18/2021   Diabetes mellitus without complication (HCC)    Type II   ESRD (end stage renal disease) (HCC)    Hyperlipidemia LDL goal <70 04/17/2017   Hypertension      FAMILY HISTORY:   Family History  Problem Relation Age of Onset   Diabetes Mellitus II Mother    Stroke Father    Hypertension Father    Diabetes Mellitus II Sister     SOCIAL HISTORY:   Social History   Tobacco Use   Smoking status: Former    Current packs/day: 0.00    Average packs/day: 0.3 packs/day for 8.0 years (2.0 ttl pk-yrs)    Types: Cigarettes    Start date: 1964    Quit date: 81    Years since quitting: 53.3   Smokeless tobacco: Never  Substance Use Topics   Alcohol use: Not Currently     ALLERGIES:   No Known Allergies   CURRENT MEDICATIONS:   Current Outpatient Medications  Medication Sig Dispense Refill   amLODipine (NORVASC) 10 MG tablet Take 1 tablet (10 mg total) by mouth at bedtime.     aspirin 81 MG  chewable tablet Chew 1 tablet (81 mg total) by mouth daily.     atorvastatin (LIPITOR) 40 MG tablet Take 1 tablet (40 mg total) by mouth at bedtime.     hydrALAZINE (APRESOLINE) 50 MG tablet Take 1 tablet (50 mg total) by mouth every 8 (eight) hours.     losartan (COZAAR) 100 MG tablet Take 1 tablet (100 mg total) by mouth daily.     Methoxy PEG-Epoetin Beta (MIRCERA IJ) Mircera     nitroGLYCERIN (NITROSTAT) 0.4 MG SL tablet Place 0.4 mg under the tongue every 5 (five) minutes as needed for chest pain.     ondansetron (ZOFRAN-ODT) 4 MG disintegrating tablet Take 1 tablet (4 mg total) by mouth every 8 (eight) hours as needed. 20 tablet 0   No current facility-administered medications for this visit.    REVIEW OF SYSTEMS:   [X]  denotes positive finding, [ ]  denotes negative finding Cardiac  Comments:  Chest pain or chest pressure:    Shortness of breath upon exertion:    Short of breath when lying flat:    Irregular heart rhythm:        Vascular    Pain in calf, thigh, or hip brought on by ambulation:    Pain in feet at night that wakes you  up from your sleep:     Blood clot in your veins:    Leg swelling:         Pulmonary    Oxygen at home:    Productive cough:     Wheezing:         Neurologic    Sudden weakness in arms or legs:     Sudden numbness in arms or legs:     Sudden onset of difficulty speaking or slurred speech:    Temporary loss of vision in one eye:     Problems with dizziness:         Gastrointestinal    Blood in stool:     Vomited blood:         Genitourinary    Burning when urinating:     Blood in urine:        Psychiatric    Major depression:         Hematologic    Bleeding problems:    Problems with blood clotting too easily:        Skin    Rashes or ulcers:        Constitutional    Fever or chills:      PHYSICAL EXAM:   Vitals:   09/07/23 0832  BP: (!) 178/76  Pulse: 66  Resp: 20  Temp: 97.8 F (36.6 C)  SpO2: 100%  Weight: 128  lb (58.1 kg)  Height: 5\' 10"  (1.778 m)    GENERAL: The patient is a well-nourished male, in no acute distress. The vital signs are documented above. CARDIAC: There is a regular rate and rhythm.  VASCULAR: Pulsatility to left forearm graft PULMONARY: Non-labored respirations MUSCULOSKELETAL: There are no major deformities or cyanosis. NEUROLOGIC: No focal weakness or paresthesias are detected. SKIN: See photo below PSYCHIATRIC: The patient has a normal affect.  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  Apparently these have been there for several months.  He recently underwent a shuntogram that shows an occluded outflow brachial vein.  I looked at his veins with ultrasound and he has an adequate basilic vein.  I think the best option for him is a jump graft from his existing venous outflow up to the basilic vein and then placing a Viabahn over top of the eschar.  This has been scheduled for Thursday, April 24.  He knows to contact me sooner if he develops any changes in his ulcers    Charlena Cross, MD, FACS [facility] [phone] Pager [phone]

## 2023-09-07 NOTE — Telephone Encounter (Signed)
 Attempted to call for surgery scheduling. LVM

## 2023-09-11 ENCOUNTER — Telehealth: Payer: Self-pay

## 2023-09-11 NOTE — Telephone Encounter (Signed)
 Spoke to pt's friend Genevia Kern, who's number he gave in office to call for surgery scheduling. Genevia Kern was not with him but will give him our number and have him return our call.

## 2023-09-12 ENCOUNTER — Telehealth: Payer: Self-pay

## 2023-09-12 NOTE — Telephone Encounter (Signed)
 Spoke to patient's friend, Genevia Kern, who is also his transportation.  Genevia Kern states that she will not arrive at 5:30am.  It is too early for her since it will be dark.  Also schedule is very busy and limited on dates/times.  This nurse suggested allowing schedule to fill up and then we would call with available dates/times.  She can say yes or no.

## 2023-09-12 NOTE — Telephone Encounter (Signed)
 Marland Kitchen

## 2023-09-13 ENCOUNTER — Other Ambulatory Visit: Payer: Self-pay

## 2023-09-13 ENCOUNTER — Emergency Department (HOSPITAL_COMMUNITY)

## 2023-09-13 ENCOUNTER — Observation Stay (HOSPITAL_COMMUNITY)
Admission: EM | Admit: 2023-09-13 | Discharge: 2023-09-14 | Disposition: A | Discharge status: Home Health | Attending: Internal Medicine | Admitting: Internal Medicine

## 2023-09-13 DIAGNOSIS — E875 Hyperkalemia: Principal | ICD-10-CM | POA: Diagnosis present

## 2023-09-13 DIAGNOSIS — J449 Chronic obstructive pulmonary disease, unspecified: Secondary | ICD-10-CM | POA: Insufficient documentation

## 2023-09-13 DIAGNOSIS — E785 Hyperlipidemia, unspecified: Secondary | ICD-10-CM | POA: Diagnosis not present

## 2023-09-13 DIAGNOSIS — N186 End stage renal disease: Secondary | ICD-10-CM | POA: Diagnosis not present

## 2023-09-13 DIAGNOSIS — Z7982 Long term (current) use of aspirin: Secondary | ICD-10-CM | POA: Insufficient documentation

## 2023-09-13 DIAGNOSIS — I120 Hypertensive chronic kidney disease with stage 5 chronic kidney disease or end stage renal disease: Secondary | ICD-10-CM | POA: Insufficient documentation

## 2023-09-13 DIAGNOSIS — Z951 Presence of aortocoronary bypass graft: Secondary | ICD-10-CM | POA: Insufficient documentation

## 2023-09-13 DIAGNOSIS — Z87891 Personal history of nicotine dependence: Secondary | ICD-10-CM | POA: Insufficient documentation

## 2023-09-13 DIAGNOSIS — Z992 Dependence on renal dialysis: Secondary | ICD-10-CM

## 2023-09-13 DIAGNOSIS — M25552 Pain in left hip: Principal | ICD-10-CM

## 2023-09-13 DIAGNOSIS — Z79899 Other long term (current) drug therapy: Secondary | ICD-10-CM | POA: Insufficient documentation

## 2023-09-13 DIAGNOSIS — Z8673 Personal history of transient ischemic attack (TIA), and cerebral infarction without residual deficits: Secondary | ICD-10-CM | POA: Insufficient documentation

## 2023-09-13 DIAGNOSIS — E11649 Type 2 diabetes mellitus with hypoglycemia without coma: Secondary | ICD-10-CM | POA: Diagnosis not present

## 2023-09-13 DIAGNOSIS — E1122 Type 2 diabetes mellitus with diabetic chronic kidney disease: Secondary | ICD-10-CM | POA: Diagnosis not present

## 2023-09-13 DIAGNOSIS — I2510 Atherosclerotic heart disease of native coronary artery without angina pectoris: Secondary | ICD-10-CM | POA: Diagnosis not present

## 2023-09-13 DIAGNOSIS — E162 Hypoglycemia, unspecified: Secondary | ICD-10-CM

## 2023-09-13 DIAGNOSIS — I10 Essential (primary) hypertension: Secondary | ICD-10-CM | POA: Insufficient documentation

## 2023-09-13 LAB — CBC WITH DIFFERENTIAL/PLATELET
Abs Immature Granulocytes: 0.03 10*3/uL (ref 0.00–0.07)
Basophils Absolute: 0.1 10*3/uL (ref 0.0–0.1)
Basophils Relative: 1 %
Eosinophils Absolute: 0.3 10*3/uL (ref 0.0–0.5)
Eosinophils Relative: 4 %
HCT: 29.8 % — ABNORMAL LOW (ref 39.0–52.0)
Hemoglobin: 9.6 g/dL — ABNORMAL LOW (ref 13.0–17.0)
Immature Granulocytes: 0 %
Lymphocytes Relative: 12 %
Lymphs Abs: 0.8 10*3/uL (ref 0.7–4.0)
MCH: 31.2 pg (ref 26.0–34.0)
MCHC: 32.2 g/dL (ref 30.0–36.0)
MCV: 96.8 fL (ref 80.0–100.0)
Monocytes Absolute: 0.7 10*3/uL (ref 0.1–1.0)
Monocytes Relative: 11 %
Neutro Abs: 4.9 10*3/uL (ref 1.7–7.7)
Neutrophils Relative %: 72 %
Platelets: 289 10*3/uL (ref 150–400)
RBC: 3.08 MIL/uL — ABNORMAL LOW (ref 4.22–5.81)
RDW: 16.8 % — ABNORMAL HIGH (ref 11.5–15.5)
WBC: 6.8 10*3/uL (ref 4.0–10.5)
nRBC: 0 % (ref 0.0–0.2)

## 2023-09-13 LAB — MRSA NEXT GEN BY PCR, NASAL: MRSA by PCR Next Gen: NOT DETECTED

## 2023-09-13 LAB — BASIC METABOLIC PANEL WITH GFR
Anion gap: 13 (ref 5–15)
BUN: 93 mg/dL — ABNORMAL HIGH (ref 8–23)
CO2: 21 mmol/L — ABNORMAL LOW (ref 22–32)
Calcium: 8.9 mg/dL (ref 8.9–10.3)
Chloride: 99 mmol/L (ref 98–111)
Creatinine, Ser: 10.73 mg/dL — ABNORMAL HIGH (ref 0.61–1.24)
GFR, Estimated: 5 mL/min — ABNORMAL LOW (ref >60)
Glucose, Bld: 86 mg/dL (ref 70–99)
Potassium: 6.1 mmol/L — ABNORMAL HIGH (ref 3.5–5.1)
Sodium: 133 mmol/L — ABNORMAL LOW (ref 135–145)

## 2023-09-13 LAB — CBG MONITORING, ED
Glucose-Capillary: 136 mg/dL — ABNORMAL HIGH (ref 70–99)
Glucose-Capillary: 39 mg/dL — CL (ref 70–99)
Glucose-Capillary: 110 mg/dL — ABNORMAL HIGH (ref 70–99)

## 2023-09-13 LAB — GLUCOSE, CAPILLARY: Glucose-Capillary: 189 mg/dL — ABNORMAL HIGH (ref 70–99)

## 2023-09-13 MED ORDER — LIDOCAINE HCL (PF) 1 % IJ SOLN: 5.0000 mL | INTRAMUSCULAR | Status: DC | PRN

## 2023-09-13 MED ORDER — HEPARIN SODIUM (PORCINE) 1000 UNIT/ML DIALYSIS: 3500.0000 [IU] | Freq: Once | INTRAMUSCULAR | Status: DC

## 2023-09-13 MED ORDER — INSULIN ASPART 100 UNIT/ML IV SOLN
5.0000 [IU] | Freq: Once | INTRAVENOUS | Status: AC
  Administered 2023-09-13: 5 [IU] via INTRAVENOUS

## 2023-09-13 MED ORDER — SODIUM ZIRCONIUM CYCLOSILICATE 10 G PO PACK
10.0000 g | PACK | Freq: Once | ORAL | Status: AC
Start: 2023-09-13 — End: 2023-09-13
  Administered 2023-09-13: 10 g via ORAL
  Filled 2023-09-13: qty 1

## 2023-09-13 MED ORDER — LOSARTAN POTASSIUM 50 MG PO TABS
100.0000 mg | ORAL_TABLET | Freq: Every day | ORAL | Status: DC
  Administered 2023-09-14: 100 mg via ORAL
  Filled 2023-09-13: qty 2

## 2023-09-13 MED ORDER — NEPRO/CARBSTEADY PO LIQD: 237.0000 mL | ORAL | Status: DC | PRN

## 2023-09-13 MED ORDER — ACETAMINOPHEN 325 MG PO TABS
650.0000 mg | ORAL_TABLET | Freq: Once | ORAL | Status: AC
  Administered 2023-09-13: 650 mg via ORAL
  Filled 2023-09-13: qty 2

## 2023-09-13 MED ORDER — DEXTROSE 50 % IV SOLN: 1.0000 | Freq: Once | INTRAVENOUS | Status: AC

## 2023-09-13 MED ORDER — AMLODIPINE BESYLATE 10 MG PO TABS: 10.0000 mg | ORAL_TABLET | Freq: Every day | ORAL | Status: DC

## 2023-09-13 MED ORDER — FUROSEMIDE 10 MG/ML IJ SOLN
100.0000 mg | Freq: Once | INTRAVENOUS | Status: DC
  Filled 2023-09-13: qty 10

## 2023-09-13 MED ORDER — HYDRALAZINE HCL 25 MG PO TABS
50.0000 mg | ORAL_TABLET | Freq: Once | ORAL | Status: AC
  Administered 2023-09-13: 50 mg via ORAL
  Filled 2023-09-13: qty 2

## 2023-09-13 MED ORDER — AMLODIPINE BESYLATE 5 MG PO TABS
10.0000 mg | ORAL_TABLET | Freq: Once | ORAL | Status: AC
  Administered 2023-09-13: 10 mg via ORAL
  Filled 2023-09-13: qty 2

## 2023-09-13 MED ORDER — FUROSEMIDE 10 MG/ML IJ SOLN
20.0000 mg | Freq: Once | INTRAMUSCULAR | Status: AC
  Administered 2023-09-13: 20 mg via INTRAVENOUS
  Filled 2023-09-13: qty 2

## 2023-09-13 MED ORDER — DEXTROSE 50 % IV SOLN
INTRAVENOUS | Status: AC
  Administered 2023-09-13: 50 mL via INTRAVENOUS
  Filled 2023-09-13: qty 50

## 2023-09-13 MED ORDER — ASPIRIN 81 MG PO CHEW
81.0000 mg | CHEWABLE_TABLET | Freq: Every day | ORAL | Status: DC
  Administered 2023-09-14: 81 mg via ORAL
  Filled 2023-09-13: qty 1

## 2023-09-13 MED ORDER — ALTEPLASE 2 MG IJ SOLR: 2.0000 mg | Freq: Once | INTRAMUSCULAR | Status: DC | PRN

## 2023-09-13 MED ORDER — ANTICOAGULANT SODIUM CITRATE 4% (200MG/5ML) IV SOLN: 5.0000 mL | Status: DC | PRN

## 2023-09-13 MED ORDER — PENTAFLUOROPROP-TETRAFLUOROETH EX AERO: 1.0000 | INHALATION_SPRAY | CUTANEOUS | Status: DC | PRN

## 2023-09-13 MED ORDER — HEPARIN SODIUM (PORCINE) 1000 UNIT/ML DIALYSIS: 1000.0000 [IU] | INTRAMUSCULAR | Status: DC | PRN

## 2023-09-13 MED ORDER — ALBUTEROL SULFATE (2.5 MG/3ML) 0.083% IN NEBU
10.0000 mg | INHALATION_SOLUTION | Freq: Once | RESPIRATORY_TRACT | Status: AC
  Administered 2023-09-13: 10 mg via RESPIRATORY_TRACT
  Filled 2023-09-13: qty 12

## 2023-09-13 MED ORDER — HYDRALAZINE HCL 50 MG PO TABS
50.0000 mg | ORAL_TABLET | Freq: Three times a day (TID) | ORAL | Status: DC
  Administered 2023-09-13 – 2023-09-14 (×2): 50 mg via ORAL
  Filled 2023-09-13 (×2): qty 1

## 2023-09-13 MED ORDER — CHLORHEXIDINE GLUCONATE CLOTH 2 % EX PADS: 6.0000 | MEDICATED_PAD | Freq: Every day | CUTANEOUS | Status: DC

## 2023-09-13 MED ORDER — LIDOCAINE-PRILOCAINE 2.5-2.5 % EX CREA: 1.0000 | TOPICAL_CREAM | CUTANEOUS | Status: DC | PRN

## 2023-09-13 MED ORDER — ATORVASTATIN CALCIUM 40 MG PO TABS
40.0000 mg | ORAL_TABLET | Freq: Every day | ORAL | Status: DC
Start: 2023-09-13 — End: 2023-09-14
  Administered 2023-09-13: 40 mg via ORAL
  Filled 2023-09-13: qty 1

## 2023-09-13 MED ORDER — FUROSEMIDE 10 MG/ML IJ SOLN
80.0000 mg | Freq: Once | INTRAMUSCULAR | Status: AC
  Administered 2023-09-13: 80 mg via INTRAVENOUS
  Filled 2023-09-13: qty 8

## 2023-09-13 MED ORDER — HEPARIN SODIUM (PORCINE) 1000 UNIT/ML DIALYSIS: 1500.0000 [IU] | INTRAMUSCULAR | Status: DC | PRN

## 2023-09-13 NOTE — ED Provider Notes (Signed)
 Patient was initially seen by Dr. Morris Arch.  Please see his note  Patient presented to the ED for evaluation of hip pain.  Patient's ED workup did not show any signs of any acute injury.  While the patient was in the ED he did have his electrolytes measured and his potassium was elevated 6.1.  Dr. Morris Arch discussed the case with nephrology and the patient was given treatment for his hyperkalemia including insulin.  Patient was holding the ED to have his glucose level rechecked.  Patient's repeat CBG is now 39.  1 amp glucose ordered.  Will recheck CBG.  Diet ordered for the patient.  Case reviewed with Dr. Zana Hesselbach.  He will plan on dialyzing the patient today.  Considering his hypoglycemia will consult with the medical service for admission observation   Trish Furl, MD 09/13/23 1031

## 2023-09-13 NOTE — Progress Notes (Signed)
 Patient completed his hemodialysis treatment. Patient dialyzed using a 1+K bath for hyperkalemia for 45 minutes and the remaining time dialyzed with a standard 2+K bath. Patient tolerated his treatment. Set goal was met without difficulty. Patient to be sent back to the ED post HD

## 2023-09-13 NOTE — H&P (Signed)
 History and Physical    Shawn Collins ZHY:[id number] DOB: 04/07/1951 DOA: 09/13/2023  PCP: Charle Congo, MD   Patient coming from: Home    Chief Complaint: Fall   HPI: Shawn Collins is a 73 y.o. male with medical history significant of ESRD on dialysis on Monday, Wednesday, Friday schedule who presented to the Emergency Department with complaint of left-sided hip pain.  Report of fall about 3 days ago while walking at a [employer].  Fell on his left side.  Since then he started having left hip and knee pain.  Pain worsened with ambulation.  He missed dialysis on Monday due to difficulty in ambulation.  No history of head injury, loss of consciousness or neck pain.  On presentation, he was hemodynamically stable with elevated blood pressure.  Lab work showed sodium of 133, potassium of 6.1, creatinine of 10.7.  To treat his hyperkalemia, patient was given insulin which caused hypoglycemia with sugars dropping to 39.  Sugar improved after giving an amp of dextrose. Patient requested to be observed overnight.  Nephrology consulted for dialysis. Patient seen and examined at the bedside in dialysis suite.  During evaluation, he was comfortably sleeping.  Woke up on calling his name.  Denies any hip pain or knee pain.  He says he lives alone and ambulates with the help of a cane  ED Course: Remains hemodynamically stable.  Complaint of left-sided hip pain on presentation.  X-ray of the hip, lumbar spine does not show any fracture or dislocation but showed degenerative changes.  Nephrology consulted for dialysis  Review of Systems: As per HPI otherwise 10 point review of systems negative.    Past Medical History:  Diagnosis Date   CAD (coronary artery disease), native coronary artery    s/p NSTEMI with cath showing severe 3VD s/p  CABG x 3 with LIMA to LAD, SVG to OM1/OM2 and SVG D1 by Dr. Nicanor Barge on 10/26/16.   COPD (chronic obstructive pulmonary disease) (HCC) 09/10/2014   CVA (cerebral  vascular accident) (HCC) 12/18/2021   Diabetes mellitus without complication (HCC)    Type II   ESRD (end stage renal disease) (HCC)    Hyperlipidemia LDL goal <70 04/17/2017   Hypertension     Past Surgical History:  Procedure Laterality Date   A/V SHUNT INTERVENTION N/A 08/17/2023   Procedure: A/V SHUNT INTERVENTION;  Surgeon: Baron Border, MD;  Location: [facility] INVASIVE CV LAB;  Service: Cardiovascular;  Laterality: N/A;   AV FISTULA PLACEMENT Left 12/21/2021   Procedure: INSERTION OF LEFT ARM ARTERIOVENOUS (AV) GORE-TEX GRAFT;  Surgeon: Dannis Dy, MD;  Location: [facility] OR;  Service: Vascular;  Laterality: Left;   CORONARY ARTERY BYPASS GRAFT N/A 10/26/2016   Procedure: CORONARY ARTERY BYPASS GRAFTING (CABG) x 4 USING LEFT INTERNAL MAMMARY ARTERY TO LAD AND ENDOSCOPICALLY HARVESTED GREATER SAPHENOUS VEIN TO OM 1, 2 AND TO DIAG;  Surgeon: Norita Beauvais, MD;  Location: [facility] OR;  Service: Open Heart Surgery;  Laterality: N/A;   ENDOVEIN HARVEST OF GREATER SAPHENOUS VEIN Right 10/26/2016   Procedure: ENDOVEIN HARVEST OF GREATER SAPHENOUS VEIN;  Surgeon: Norita Beauvais, MD;  Location: [facility] OR;  Service: Open Heart Surgery;  Laterality: Right;   IR THORACENTESIS ASP PLEURAL SPACE W/IMG GUIDE  11/03/2016   LEFT HEART CATH AND CORONARY ANGIOGRAPHY N/A 10/21/2016   Procedure: Left Heart Cath and Coronary Angiography;  Surgeon: Swaziland, Peter M, MD;  Location: [facility] INVASIVE CV LAB;  Service: Cardiovascular;  Laterality: N/A;   TEE WITHOUT CARDIOVERSION  N/A 10/26/2016   Procedure: TRANSESOPHAGEAL ECHOCARDIOGRAM (TEE);  Surgeon: Norita Beauvais, MD;  Location: [facility] OR;  Service: Open Heart Surgery;  Laterality: N/A;     reports that he quit smoking about 53 years ago. His smoking use included cigarettes. He started smoking about 61 years ago. He has a 2 pack-year smoking history. He has never used smokeless tobacco. He reports that he does not currently use alcohol. He reports that he does not  currently use drugs.  No Known Allergies  Family History  Problem Relation Age of Onset   Diabetes Mellitus II Mother    Stroke Father    Hypertension Father    Diabetes Mellitus II Sister      Prior to Admission medications   Medication Sig Start Date End Date Taking? Authorizing Provider  amLODipine (NORVASC) 10 MG tablet Take 1 tablet (10 mg total) by mouth at bedtime. 07/20/23   [name], DO  aspirin 81 MG chewable tablet Chew 1 tablet (81 mg total) by mouth daily. 07/20/23   [name], DO  atorvastatin (LIPITOR) 40 MG tablet Take 1 tablet (40 mg total) by mouth at bedtime. 07/20/23   [name], DO  hydrALAZINE (APRESOLINE) 50 MG tablet Take 1 tablet (50 mg total) by mouth every 8 (eight) hours. 07/20/23   [name], DO  losartan (COZAAR) 100 MG tablet Take 1 tablet (100 mg total) by mouth daily. 07/20/23   [name], DO  Methoxy PEG-Epoetin Beta (MIRCERA IJ) Mircera 08/09/23 08/07/24  [provider]  nitroGLYCERIN (NITROSTAT) 0.4 MG SL tablet Place 0.4 mg under the tongue every 5 (five) minutes as needed for chest pain.    [provider]  ondansetron (ZOFRAN-ODT) 4 MG disintegrating tablet Take 1 tablet (4 mg total) by mouth every 8 (eight) hours as needed. 07/25/23   [name], MD    Physical Exam: Vitals:   09/13/23 0930 09/13/23 1000 09/13/23 1030 09/13/23 1100  BP: (!) 136/59 131/61 136/64 (!) 161/73  Pulse: 76 79 71 70  Resp: 15  16   Temp:      TempSrc:      SpO2: 98% 99% 99% 100%  Weight:      Height:        Constitutional: Comfortable lying on dialysis bed.  Being dialyzed  Eyes: PERRL, lids and conjunctivae normal ENMT: Mucous membranes are moist.  Neck: normal, supple, no masses, no thyromegaly Respiratory: clear to auscultation bilaterally, no wheezing, no crackles. Normal respiratory effort. No accessory muscle use.  Cardiovascular: Regular rate and rhythm, no murmurs / rubs / gallops. No extremity edema.   Abdomen: no tenderness, no masses palpated. No hepatosplenomegaly. Bowel sounds positive.  Musculoskeletal: no clubbing / cyanosis. No joint deformity upper and lower extremities.  AV fistula on the left upper extremity Skin: no rashes, lesions, ulcers. No induration Neurologic: CN 2-12 grossly intact.  Strength 5/5 in all 4.  Psychiatric: Normal judgment and insight. Alert and oriented x 3. Normal mood.   Foley Catheter:None  Labs on Admission: I have personally reviewed following labs and imaging studies  CBC: Recent Labs  Lab 09/13/23 0257  WBC 6.8  NEUTROABS 4.9  HGB 9.6*  HCT 29.8*  MCV 96.8  PLT 289   Basic Metabolic Panel: Recent Labs  Lab 09/13/23 0257  NA 133*  K 6.1*  CL 99  CO2 21*  GLUCOSE 86  BUN 93*  CREATININE 10.73*  CALCIUM 8.9   GFR: Estimated Creatinine Clearance: 5 mL/min (A) (by  C-G formula based on SCr of 10.73 mg/dL (H)). Liver Function Tests: No results for input(s): "AST", "ALT", "ALKPHOS", "BILITOT", "PROT", "ALBUMIN" in the last 168 hours. No results for input(s): "LIPASE", "AMYLASE" in the last 168 hours. No results for input(s): "AMMONIA" in the last 168 hours. Coagulation Profile: No results for input(s): "INR", "PROTIME" in the last 168 hours. Cardiac Enzymes: No results for input(s): "CKTOTAL", "CKMB", "CKMBINDEX", "TROPONINI" in the last 168 hours. BNP (last 3 results) No results for input(s): "PROBNP" in the last 8760 hours. HbA1C: No results for input(s): "HGBA1C" in the last 72 hours. CBG: Recent Labs  Lab 09/13/23 0956 09/13/23 1029  GLUCAP 39* 136*   Lipid Profile: No results for input(s): "CHOL", "HDL", "LDLCALC", "TRIG", "CHOLHDL", "LDLDIRECT" in the last 72 hours. Thyroid Function Tests: No results for input(s): "TSH", "T4TOTAL", "FREET4", "T3FREE", "THYROIDAB" in the last 72 hours. Anemia Panel: No results for input(s): "VITAMINB12", "FOLATE", "FERRITIN", "TIBC", "IRON", "RETICCTPCT" in the last 72 hours. Urine  analysis:    Component Value Date/Time   COLORURINE YELLOW 04/12/2023 1953   APPEARANCEUR CLEAR 04/12/2023 1953   LABSPEC 1.010 04/12/2023 1953   PHURINE 9.0 (H) 04/12/2023 1953   GLUCOSEU 50 (A) 04/12/2023 1953   HGBUR SMALL (A) 04/12/2023 1953   BILIRUBINUR NEGATIVE 04/12/2023 1953   KETONESUR NEGATIVE 04/12/2023 1953   PROTEINUR 100 (A) 04/12/2023 1953   UROBILINOGEN 1.0 06/27/2010 0631   NITRITE NEGATIVE 04/12/2023 1953   LEUKOCYTESUR TRACE (A) 04/12/2023 1953    Radiological Exams on Admission: DG Lumbar Spine Complete Result Date: 09/13/2023 CLINICAL DATA:  Low back pain, left hip pain.  Fell 4 days ago. EXAM: LUMBAR SPINE - COMPLETE 4+ VIEW; DG HIP (WITH OR WITHOUT PELVIS) 2-3V LEFT COMPARISON:  CT abdomen pelvis without contrast and reconstructions 07/25/2023. FINDINGS: AP pelvis and frog-leg left hip, two views: There is osteopenia. There is no evidence of left hip fracture or dislocation, no evidence of pelvic fracture, diastasis or focal bone lesions. The visualized proximal right femur is unremarkable. There are minimal degenerative features symmetrically at both hips. There are bridging osteophytes of both SI joints. Pubic symphysis is patent. There is facet hypertrophy of the lower lumbar segments. There are scattered pelvic phleboliths. The iliofemoral arteries are heavily calcified. Lumbar spine, four views: There is osteopenia without evidence of fractures. The vertebra are normal in heights. There is prominent facet hypertrophy at L4-5 and L5-S1, grade 1 anterolisthesis at both levels. The lumbar alignment is otherwise normal. The discs are normal in heights. There is mild lumbar spondylosis. There are punctate scattered nonobstructive stones in both kidneys. The abdominal aorta is heavily calcified. IMPRESSION: 1. Osteopenia without evidence of fractures. 2. Degenerative changes of the hips, SI joints and lower lumbar segments. 3. Grade 1 degenerative spondylolisthesis L4-5 and  L5-S1. 4. Heavy aortic and iliofemoral atherosclerosis. 5. Punctate nonobstructive caliceal stones in both kidneys. Electronically Signed   By: Denman Fischer M.D.   On: 09/13/2023 02:16   DG HIP UNILAT W OR W/O PELVIS 2-3 VIEWS LEFT Result Date: 09/13/2023 CLINICAL DATA:  Low back pain, left hip pain.  Fell 4 days ago. EXAM: LUMBAR SPINE - COMPLETE 4+ VIEW; DG HIP (WITH OR WITHOUT PELVIS) 2-3V LEFT COMPARISON:  CT abdomen pelvis without contrast and reconstructions 07/25/2023. FINDINGS: AP pelvis and frog-leg left hip, two views: There is osteopenia. There is no evidence of left hip fracture or dislocation, no evidence of pelvic fracture, diastasis or focal bone lesions. The visualized proximal right femur is unremarkable. There  are minimal degenerative features symmetrically at both hips. There are bridging osteophytes of both SI joints. Pubic symphysis is patent. There is facet hypertrophy of the lower lumbar segments. There are scattered pelvic phleboliths. The iliofemoral arteries are heavily calcified. Lumbar spine, four views: There is osteopenia without evidence of fractures. The vertebra are normal in heights. There is prominent facet hypertrophy at L4-5 and L5-S1, grade 1 anterolisthesis at both levels. The lumbar alignment is otherwise normal. The discs are normal in heights. There is mild lumbar spondylosis. There are punctate scattered nonobstructive stones in both kidneys. The abdominal aorta is heavily calcified. IMPRESSION: 1. Osteopenia without evidence of fractures. 2. Degenerative changes of the hips, SI joints and lower lumbar segments. 3. Grade 1 degenerative spondylolisthesis L4-5 and L5-S1. 4. Heavy aortic and iliofemoral atherosclerosis. 5. Punctate nonobstructive caliceal stones in both kidneys. Electronically Signed   By: Denman Fischer M.D.   On: 09/13/2023 02:16     Assessment/Plan Principal Problem:   Hyperkalemia Active Problems:   ESRD (end stage renal disease) on dialysis  (HCC)   Hyperlipidemia LDL goal <70   HTN (hypertension)   Hyperkalemia/ESRD on dialysis: Presented with potassium of 6.1.  Given albuterol nebulization, Lokelma, insulin.  Nephrology following.  Plan for dialysis today.  Typically dialyzed on Monday, Wednesday, Friday.  Missed dialysis on Monday because of difficulty in ambulation.  Hypertension: Continue home medications.  Monitor blood pressure  Hyperlipidemia: Continue Lipitor  History of coronary artery disease: No anginal symptoms.  Continue current medications  Recent history of fall: Presented with left hip/knee pain.  Imaging did not show any fracture or dislocation but showed generative changes.  PT/OT consulted. No history of head injury.  Denies any pain in the left hip or  knee today       Severity of Illness: The appropriate patient status for this patient is OBSERVATION. Observation status is judged to be reasonable and necessary in order to provide the required intensity of service to ensure the patient's safety. The patient's presenting symptoms, physical exam findings, and initial radiographic and laboratory data in the context of their medical condition is felt to place them at decreased risk for further clinical deterioration. Furthermore, it is anticipated that the patient will be medically stable for discharge from the hospital within 2 midnights of admission.    DVT prophylaxis: Heparin [place] Code Status: Full Family Communication: None at bedside Consults called: Nephrology     Leona Rake MD [facility]  09/13/2023, 11:25 AM

## 2023-09-13 NOTE — ED Notes (Signed)
 NT alerted secondary RN that pt CBG 39. Secondary RN administered Dw50 via IV and pt drank a can of soda. Pt is Shawn Collins.   Dr. [name] en route to discuss care with pt.   NT will recheck CBG in 15-30 minutes

## 2023-09-13 NOTE — Progress Notes (Signed)
New Admission Note:   Arrival Method:  Mental Orientation: Telemetry:  Assessment: Completed Skin: IV: Pain:  Tubes: Safety Measures: Safety Fall Prevention Plan has been discussed.  Admission: Completed 5MW Orientation: Patient has been oriented to the room, unit and staff.  Family:  Orders have been reviewed and implemented. Will continue to monitor the patient. Call light has been placed within reach and bed alarm has been activated.   Charito Bokiagon BSN, RN-BC Phone number: [phone] 

## 2023-09-13 NOTE — Care Management Obs Status (Signed)
 MEDICARE OBSERVATION STATUS NOTIFICATION   Patient Details  Name: Shawn Collins MRN: [id number] Date of Birth: [birth year]   Medicare Observation Status Notification Given:  Yes    Keyra Virella, RN 09/13/2023, 6:53 PM

## 2023-09-13 NOTE — ED Notes (Signed)
 Assumed care of the patient - Pt consuming his meal tray and was provided a gingerale. Call bell within reach.

## 2023-09-13 NOTE — ED Notes (Signed)
 Pt transported to XRAY

## 2023-09-13 NOTE — Consult Note (Signed)
 Renal Service Consult Note [facility]  Shawn Collins 09/13/2023 [name], MD Requesting Physician: Dr [name]  Reason for Consult: ESRD patient with hip pain after a fall HPI: The patient is a 73 y.o. year-old w/ PMH as below who presented to ED early on this morning complaining of left hip and knee pain.  He had a fall this past Sunday at [place].  He missed dialysis Monday due to the pain.  In the ED blood pressure was 189/80, heart rate 70, RR 14, temp 98, 100% sats on room air.  Potassium 6.1, BUN 93, creatinine 10.7, sodium 133, Hgb 9.6, WBC 6K.  Plain films of the lumbar spine and left hip were negative for fracture. Pt was to be dc'd but his BS dropped and he was not able to dc'd to OP HD at 10 am. Pt will be admitted. We are asked to see for dialysis.    Pt seen in ED. Pt is drowsy, awakens easily, no c/o's at this time.    ROS - denies CP, no joint pain, no HA, no blurry vision, no rash, no diarrhea, no nausea/ vomiting  PMH: CAD COPD History of CVA Diabetes mellitus ESRD on HD HL HTN  Past Surgical History  Past Surgical History:  Procedure Laterality Date   A/V SHUNT INTERVENTION N/A 08/17/2023   Procedure: A/V SHUNT INTERVENTION;  Surgeon: Baron Border, MD;  Location: [facility] INVASIVE CV LAB;  Service: Cardiovascular;  Laterality: N/A;   AV FISTULA PLACEMENT Left 12/21/2021   Procedure: INSERTION OF LEFT ARM ARTERIOVENOUS (AV) GORE-TEX GRAFT;  Surgeon: Dannis Dy, MD;  Location: [facility] OR;  Service: Vascular;  Laterality: Left;   CORONARY ARTERY BYPASS GRAFT N/A 10/26/2016   Procedure: CORONARY ARTERY BYPASS GRAFTING (CABG) x 4 USING LEFT INTERNAL MAMMARY ARTERY TO LAD AND ENDOSCOPICALLY HARVESTED GREATER SAPHENOUS VEIN TO OM 1, 2 AND TO DIAG;  Surgeon: Norita Beauvais, MD;  Location: [facility] OR;  Service: Open Heart Surgery;  Laterality: N/A;   ENDOVEIN HARVEST OF GREATER SAPHENOUS VEIN Right 10/26/2016   Procedure: ENDOVEIN HARVEST OF  GREATER SAPHENOUS VEIN;  Surgeon: Norita Beauvais, MD;  Location: [facility] OR;  Service: Open Heart Surgery;  Laterality: Right;   IR THORACENTESIS ASP PLEURAL SPACE W/IMG GUIDE  11/03/2016   LEFT HEART CATH AND CORONARY ANGIOGRAPHY N/A 10/21/2016   Procedure: Left Heart Cath and Coronary Angiography;  Surgeon: Swaziland, Peter M, MD;  [facility] INVASIVE CV LAB;  Service: Cardiovascular;  Laterality: N/A;   TEE WITHOUT CARDIOVERSION N/A 10/26/2016   Procedure: TRANSESOPHAGEAL ECHOCARDIOGRAM (TEE);  Surgeon: Norita Beauvais, MD;  Location: [facility] OR;  Service: Open Heart Surgery;  Laterality: N/A;   Family History  Family History  Problem Relation Age of Onset   Diabetes Mellitus II Mother    Stroke Father    Hypertension Father    Diabetes Mellitus II Sister    Social History  reports that he quit smoking about 53 years ago. His smoking use included cigarettes. He started smoking about 61 years ago. He has a 2 pack-year smoking history. He has never used smokeless tobacco. He reports that he does not currently use alcohol. He reports that he does not currently use drugs. Allergies No Known Allergies Home medications Prior to Admission medications   Medication Sig Start Date End Date Taking? Authorizing Provider  amLODipine (NORVASC) 10 MG tablet Take 1 tablet (10 mg total) by mouth at bedtime. 07/20/23   [name], DO  aspirin 81  MG chewable tablet Chew 1 tablet (81 mg total) by mouth daily. 07/20/23   [name], DO  atorvastatin (LIPITOR) 40 MG tablet Take 1 tablet (40 mg total) by mouth at bedtime. 07/20/23   [name], DO  hydrALAZINE (APRESOLINE) 50 MG tablet Take 1 tablet (50 mg total) by mouth every 8 (eight) hours. 07/20/23   [name], DO  losartan (COZAAR) 100 MG tablet Take 1 tablet (100 mg total) by mouth daily. 07/20/23   [name], DO  Methoxy PEG-Epoetin Beta (MIRCERA IJ) Mircera 08/09/23 08/07/24  [provider]  nitroGLYCERIN (NITROSTAT) 0.4 MG SL tablet  Place 0.4 mg under the tongue every 5 (five) minutes as needed for chest pain.    [provider]  ondansetron (ZOFRAN-ODT) 4 MG disintegrating tablet Take 1 tablet (4 mg total) by mouth every 8 (eight) hours as needed. 07/25/23   [name], MD     Vitals:   09/13/23 0815 09/13/23 0930 09/13/23 1000 09/13/23 1030  BP: (!) 183/78 (!) 136/59 131/61 136/64  Pulse: 67 76 79 71  Resp: 15 15  16   Temp:      TempSrc:      SpO2: 99% 98% 99% 99%  Weight:      Height:       Exam Gen alert, RA, elderly AAM no distress No rash, cyanosis or gangrene Sclera anicteric, throat clear  No jvd or bruits Chest clear bilat to bases RRR no MRG Abd soft ntnd no mass or ascites +bs GU deferred MS no joint effusions or deformity Ext no LE or UE edema, no other edema Neuro is lethargic, awakens, Ox 3 , nf    LFA AVF+bruit       Renal-related home meds: Norvasc 10 daily Hydralazine 50 3 times daily Losartan 100 daily    OP HD:  MWF [facility] 3h   B400   55.5kg    2K bath   AVF   Heparin 5000 Last HD 4/11, post wt 57.4kg Coming off 1-3kg over Mircera 120mcg  q 4 wks Venofer 50 weekly  Hectorol 3 mcg IV Sensipar 30mg  three times per week    Assessment/ Plan: Hyperkalemia: s/p temporizing measures in ED, plan HD later today.  L hip / knee pain: after fall this weekend. Per pmd.  ESRD: on HD MWF. HD as above.  HTN: Bp's are stable here, resume home meds after HD.  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  Secondary hyperparathyroidism: Ca in range, add on phos.    [name]  MD [facility] 09/13/2023, 10:54 AM  Recent Labs  Lab 09/13/23 0257  HGB 9.6*  CALCIUM 8.9  CREATININE 10.73*  K 6.1*   Inpatient medications:

## 2023-09-13 NOTE — ED Triage Notes (Signed)
 Pt BIB [facility] from home. Pt reports a fall Sunday at [place]. Pt denies LOC, denies hitting head and denies blood thinners. Pt c/o L hip and L knee pain. Pt did miss dialysis Monday due to the pain. EMS reports pt did ambulate on scene with minimal assistance.

## 2023-09-13 NOTE — ED Provider Notes (Signed)
 [place] EMERGENCY DEPARTMENT AT [facility] Provider Note  CSN: [id number] Arrival date & time: 09/13/23 0044  Chief Complaint(s) Hip Pain  HPI Shawn Collins is a 73 y.o. male with a past medical history listed below including ESRD on dialysis MWF who presents to the emergency department with left-sided hip pain following a mechanical fall 3 days ago.  Patient reports tripping while walking causing him to fall onto his left side.  He endorses feeling left hip and knee pain following the fall.  Worse with ambulation.  Reports that it took him an hour to walk a block.  Reports missing dialysis on Monday due to the pain.  Denied any headache or loss of consciousness.  Denies any neck pain, chest pain, abdominal pain, upper or mid back pain.  No other extremity pain.  No other physical complaints.  The history is provided by the patient.    Past Medical History Past Medical History:  Diagnosis Date   CAD (coronary artery disease), native coronary artery    s/p NSTEMI with cath showing severe 3VD s/p  CABG x 3 with LIMA to LAD, SVG to OM1/OM2 and SVG D1 by Dr. Nicanor Barge on 10/26/16.   COPD (chronic obstructive pulmonary disease) (HCC) 09/10/2014   CVA (cerebral vascular accident) (HCC) 12/18/2021   Diabetes mellitus without complication (HCC)    Type II   ESRD (end stage renal disease) (HCC)    Hyperlipidemia LDL goal <70 04/17/2017   Hypertension    Patient Active Problem List   Diagnosis Date Noted   COVID-19 07/10/2023   Hyperkalemia 07/10/2023   Metabolic acidosis with increased anion gap and accumulation of organic acids 07/10/2023   Hypertensive urgency 07/10/2023   Hypoglycemia 07/10/2023   Weakness 07/10/2023   Syncope 07/05/2022   Syncope and collapse 07/04/2022   Type 2 diabetes mellitus (HCC) 07/04/2022   Anemia of chronic disease 07/04/2022   Acute metabolic encephalopathy due to hypoglycemia 12/30/2021   Hypothermia 12/30/2021   Uncontrolled type 2 diabetes  mellitus with hypoglycemia, with long-term current use of insulin (HCC) 12/30/2021   History of CVA (cerebrovascular accident) 12/30/2021   DNR (do not resuscitate) 12/30/2021   Stroke determined by clinical assessment (HCC) 12/18/2021   ESRD (end stage renal disease) on dialysis (HCC) 02/23/2021   DOE (dyspnea on exertion) 11/22/2017   Hyperlipidemia LDL goal <70 04/17/2017   CAD (coronary artery disease), native coronary artery    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 09/10/2014   Diabetes mellitus without complication (HCC) 09/10/2014   Hypertension 09/10/2014   Emphysema lung (HCC) 09/10/2014   Home Medication(s) Prior to Admission medications   Medication Sig Start Date End Date Taking? Authorizing Provider  amLODipine (NORVASC) 10 MG tablet Take 1 tablet (10 mg total) by mouth at bedtime. 07/20/23   [name], DO  aspirin 81 MG chewable tablet Chew 1 tablet (81 mg total) by mouth daily. 07/20/23   [name], DO  atorvastatin (LIPITOR) 40 MG tablet Take 1 tablet (40 mg total) by mouth at bedtime. 07/20/23   [name], DO  hydrALAZINE (APRESOLINE) 50 MG tablet  Take 1 tablet (50 mg total) by mouth every 8 (eight) hours. 07/20/23   Noralee Stain, DO  losartan (COZAAR) 100 MG tablet Take 1 tablet (100 mg total) by mouth daily. 07/20/23   Noralee Stain, DO  Methoxy PEG-Epoetin Beta (MIRCERA IJ) Mircera 08/09/23 08/07/24  [provider]  nitroGLYCERIN (NITROSTAT) 0.4 MG SL tablet Place 0.4 mg under the tongue every 5 (five) minutes as needed for chest pain.    [provider]  ondansetron (ZOFRAN-ODT) 4 MG disintegrating tablet Take 1 tablet (4 mg total) by mouth every 8 (eight) hours as needed. 07/25/23   Horton, Mayer Masker, MD                                                                                                                                    Allergies Patient has no known allergies.  Review of Systems Review of Systems As noted in HPI  Physical Exam Vital Signs  I have reviewed the triage vital signs BP (!) 191/81   Pulse 69   Temp 98.7 F (37.1 C) (Oral)   Resp 12   Ht 5\' 10"  (1.778 m)   Wt 58 kg   SpO2 99%   BMI 18.35 kg/m   Physical Exam Constitutional:      General: He is not in acute distress.    Appearance: He is well-developed. He is not diaphoretic.  HENT:     Head: Normocephalic.     Right Ear: External ear normal.     Left Ear: External ear normal.  Eyes:     General: No scleral icterus.       Right eye: No discharge.        Left eye: No discharge.     Conjunctiva/sclera: Conjunctivae normal.     Pupils: Pupils are equal, round, and reactive to light.  Cardiovascular:     Rate and Rhythm: Regular rhythm.     Pulses:          Radial pulses are 2+ on the right side and 2+ on the left side.       Dorsalis pedis pulses are 2+ on the right side and 2+ on the left side.     Heart sounds: Normal heart sounds. No murmur heard.    No friction rub. No gallop.  Pulmonary:     Effort: Pulmonary effort is normal. No respiratory distress.     Breath sounds: Normal breath sounds. No stridor.  Abdominal:     General: There is no distension.     Palpations: Abdomen is soft.     Tenderness: There is no abdominal tenderness.  Musculoskeletal:     Cervical back: Normal range of motion and neck supple. No bony tenderness.     Thoracic back: No bony tenderness.     Lumbar back: No bony tenderness.     Right hip: No tenderness.     Left hip: Tenderness  present. Normal range of motion. Normal strength.     Right  knee: No bony tenderness. Normal range of motion. No tenderness.     Left knee: No bony tenderness. Normal range of motion. No tenderness.       Legs:     Comments: Clavicle stable. Chest stable to AP/Lat compression. Pelvis stable to Lat compression. No obvious extremity deformity. No chest or abdominal wall contusion.  Skin:    General: Skin is warm.       Neurological:     Mental Status: He is alert and oriented to person, place, and time.     GCS: GCS eye subscore is 4. GCS verbal subscore is 5. GCS motor subscore is 6.     Comments: Moving all extremities      ED Results and Treatments Labs (all labs ordered are listed, but only abnormal results are displayed) Labs Reviewed  CBC WITH DIFFERENTIAL/PLATELET - Abnormal; Notable for the following components:      Result Value   RBC 3.08 (*)    Hemoglobin 9.6 (*)    HCT 29.8 (*)    RDW 16.8 (*)    All other components within normal limits  BASIC METABOLIC PANEL WITH GFR - Abnormal; Notable for the following components:   Sodium 133 (*)    Potassium 6.1 (*)    CO2 21 (*)    BUN 93 (*)    Creatinine, Ser 10.73 (*)    GFR, Estimated 5 (*)    All other components within normal limits                                                                                                                         EKG  EKG Interpretation Date/Time:  Wednesday September 13 2023 00:54:35 EDT Ventricular Rate:  73 PR Interval:  161 QRS Duration:  90 QT Interval:  405 QTC Calculation: 447 R Axis:   8  Text Interpretation: Sinus rhythm Probable left ventricular hypertrophy peaked T waves Confirmed by Townsend Freud [phone]) on 09/13/2023 4:06:30 AM       Radiology DG Lumbar Spine Complete Result Date: 09/13/2023 CLINICAL DATA:  Low back pain, left hip pain.  Fell 4 days ago. EXAM: LUMBAR SPINE - COMPLETE 4+ VIEW; DG HIP (WITH OR WITHOUT PELVIS) 2-3V LEFT COMPARISON:  CT abdomen pelvis without contrast and reconstructions 07/25/2023.  FINDINGS: AP pelvis and frog-leg left hip, two views: There is osteopenia. There is no evidence of left hip fracture or dislocation, no evidence of pelvic fracture, diastasis or focal bone lesions. The visualized proximal right femur is unremarkable. There are minimal degenerative features symmetrically at both hips. There are bridging osteophytes of both SI joints. Pubic symphysis is patent. There is facet hypertrophy of the lower lumbar segments. There are scattered pelvic phleboliths. The iliofemoral arteries are heavily calcified. Lumbar spine, four views: There is osteopenia without evidence of fractures. The vertebra are normal in heights. There is prominent facet hypertrophy at L4-5 and  L5-S1, grade 1 anterolisthesis at both levels. The lumbar alignment is otherwise normal. The discs are normal in heights. There is mild lumbar spondylosis. There are punctate scattered nonobstructive stones in both kidneys. The abdominal aorta is heavily calcified. IMPRESSION: 1. Osteopenia without evidence of fractures. 2. Degenerative changes of the hips, SI joints and lower lumbar segments. 3. Grade 1 degenerative spondylolisthesis L4-5 and L5-S1. 4. Heavy aortic and iliofemoral atherosclerosis. 5. Punctate nonobstructive caliceal stones in both kidneys. Electronically Signed   By: Denman Fischer M.D.   On: 09/13/2023 02:16   DG HIP UNILAT W OR W/O PELVIS 2-3 VIEWS LEFT Result Date: 09/13/2023 CLINICAL DATA:  Low back pain, left hip pain.  Fell 4 days ago. EXAM: LUMBAR SPINE - COMPLETE 4+ VIEW; DG HIP (WITH OR WITHOUT PELVIS) 2-3V LEFT COMPARISON:  CT abdomen pelvis without contrast and reconstructions 07/25/2023. FINDINGS: AP pelvis and frog-leg left hip, two views: There is osteopenia. There is no evidence of left hip fracture or dislocation, no evidence of pelvic fracture, diastasis or focal bone lesions. The visualized proximal right femur is unremarkable. There are minimal degenerative features symmetrically at both  hips. There are bridging osteophytes of both SI joints. Pubic symphysis is patent. There is facet hypertrophy of the lower lumbar segments. There are scattered pelvic phleboliths. The iliofemoral arteries are heavily calcified. Lumbar spine, four views: There is osteopenia without evidence of fractures. The vertebra are normal in heights. There is prominent facet hypertrophy at L4-5 and L5-S1, grade 1 anterolisthesis at both levels. The lumbar alignment is otherwise normal. The discs are normal in heights. There is mild lumbar spondylosis. There are punctate scattered nonobstructive stones in both kidneys. The abdominal aorta is heavily calcified. IMPRESSION: 1. Osteopenia without evidence of fractures. 2. Degenerative changes of the hips, SI joints and lower lumbar segments. 3. Grade 1 degenerative spondylolisthesis L4-5 and L5-S1. 4. Heavy aortic and iliofemoral atherosclerosis. 5. Punctate nonobstructive caliceal stones in both kidneys. Electronically Signed   By: Denman Fischer M.D.   On: 09/13/2023 02:16    Medications Ordered in ED Medications  insulin aspart (novoLOG) injection 5 Units (has no administration in time range)  albuterol (PROVENTIL) (2.5 MG/3ML) 0.083% nebulizer solution 10 mg (has no administration in time range)  acetaminophen (TYLENOL) tablet 650 mg (650 mg Oral Given 09/13/23 0333)  sodium zirconium cyclosilicate (LOKELMA) packet 10 g (10 g Oral Given 09/13/23 0524)  furosemide (LASIX) injection 80 mg (80 mg Intravenous Given 09/13/23 0539)  furosemide (LASIX) injection 20 mg (20 mg Intravenous Given 09/13/23 0540)   Procedures .Critical Care  Performed by: Lindle Rhea, MD Authorized by: Lindle Rhea, MD   Critical care provider statement:    Critical care time (minutes):  43   Critical care time was exclusive of:  Separately billable procedures and treating other patients   Critical care was necessary to treat or prevent imminent or life-threatening  deterioration of the following conditions:  Renal failure   Critical care was time spent personally by me on the following activities:  Development of treatment plan with patient or surrogate, discussions with consultants, evaluation of patient's response to treatment, examination of patient, obtaining history from patient or surrogate, review of old charts, re-evaluation of patient's condition, pulse oximetry, ordering and review of radiographic studies, ordering and review of laboratory studies and ordering and performing treatments and interventions   (including critical care time) Medical Decision Making / ED Course   Medical Decision Making Amount and/or Complexity of Data Reviewed Labs:  ordered. Decision-making details documented in ED Course. Radiology: ordered and independent interpretation performed. Decision-making details documented in ED Course. ECG/medicine tests: ordered and independent interpretation performed. Decision-making details documented in ED Course.  Risk OTC drugs. Prescription drug management. Decision regarding hospitalization.    Left hip pain due to mechanical fall.  X-rays negative for any acute injuries of the lumbar or pelvic region. Since patient missed dialysis, basic labs obtained and patient noted to have hyperkalemia at 6.1 with peaked T waves on EKG. Lokelma ordered.  Discussed case with Dr. Zelda Hickman from nephrology who recommended 100 mg of Lasix since patient still makes urine.  Also recommended to temporize patient.  Since patient does not have respiratory distress, he feels patient will likely be stable to be discharged to his facility[facility] for dialysis at 10 AM.  Lasix given. Albuterol, Insulin/Glucose ordered.  If stable after 1 hour, anticipate discharge directly to HD center. Reconsult Nephorlogy if needed.  Patient care turned over to oncoming provider. Patient case and results discussed in detail; please see their note for  further ED managment.       Final Clinical Impression(s) / ED Diagnoses Final diagnoses:  Left hip pain  Hyperkalemia    This chart was dictated using voice recognition software.  Despite best efforts to proofread,  errors can occur which can change the documentation meaning.    Lindle Rhea, MD 09/13/23 [phone]

## 2023-09-14 DIAGNOSIS — E875 Hyperkalemia: Secondary | ICD-10-CM | POA: Diagnosis not present

## 2023-09-14 LAB — BASIC METABOLIC PANEL WITH GFR
Anion gap: 12 (ref 5–15)
BUN: 54 mg/dL — ABNORMAL HIGH (ref 8–23)
CO2: 25 mmol/L (ref 22–32)
Calcium: 8.6 mg/dL — ABNORMAL LOW (ref 8.9–10.3)
Chloride: 98 mmol/L (ref 98–111)
Creatinine, Ser: 7.89 mg/dL — ABNORMAL HIGH (ref 0.61–1.24)
GFR, Estimated: 7 mL/min — ABNORMAL LOW (ref >60)
Glucose, Bld: 79 mg/dL (ref 70–99)
Potassium: 5 mmol/L (ref 3.5–5.1)
Sodium: 135 mmol/L (ref 135–145)

## 2023-09-14 LAB — MISC LABCORP TEST (SEND OUT): Labcorp test code: 6510

## 2023-09-14 LAB — HEPATITIS B SURFACE ANTIBODY, QUANTITATIVE: Hep B S AB Quant (Post): 86.6 m[IU]/mL

## 2023-09-14 NOTE — Discharge Summary (Signed)
 Physician Discharge Summary  Shawn Collins ZOX:[id number] DOB: 1950-06-16 DOA: 09/13/2023  PCP: Charle Congo, MD  Admit date: 09/13/2023 Discharge date: 09/14/2023  Admitted From: Home Disposition:  Home  Discharge Condition:Stable CODE STATUS:FULL Diet recommendation: Renal diet   Brief/Interim Summary: Shawn Collins is a 73 y.o. male with medical history significant of ESRD on dialysis on Monday, Wednesday, Friday schedule who presented to the Emergency Department with complaint of left-sided hip pain.  Report of fall about 3 days ago while walking at a [employer].  Fell on his left side.  Since then he started having left hip and knee pain.  Pain worsened with ambulation.  He missed dialysis on Monday due to difficulty in ambulation.  On presentation, he was hemodynamically stable with elevated blood pressure.  Lab work showed sodium of 133, potassium of 6.1, creatinine of 10.7.  To treat his hyperkalemia, patient was given insulin which caused hypoglycemia with sugars dropping to 39.  Sugar improved after giving an amp of dextrose. Patient requested to be observed overnight.  Nephrology consulted for dialysis.  Underwent dialysis on 4/16.  Remains very comfortable today.  PT recommended home health.  He feels ready to go home.  He continues dialysis as an outpatient.  Following problems were addressed during the hospitalization:  Hyperkalemia/ESRD on dialysis: Presented with potassium of 6.1.  Given albuterol nebulization, Lokelma, insulin.  Nephrology was following,dialyzed on 4/16. Missed dialysis on Monday because of difficulty in ambulation.  Remains euvolemic this morning.  He continues outpatient dialysis.   Hypertension: Continue home medications.  Monitor blood pressure at home   Hyperlipidemia: Continue Lipitor   History of coronary artery disease: No anginal symptoms.  Continue current medications   Recent history of fall: Presented with left hip/knee pain.  Imaging did not  show any fracture or dislocation but showed generative changes.  PT/OT consulted.  Recommend home health No history of head injury.  Denies any pain in the left hip or  knee today   Discharge Diagnoses:  Principal Problem:   Hyperkalemia Active Problems:   ESRD (end stage renal disease) on dialysis (HCC)   Hyperlipidemia LDL goal <70   HTN (hypertension)    Discharge Instructions  Discharge Instructions     Diet - low sodium heart healthy   Complete by: As directed    Discharge instructions   Complete by: As directed    1)Continue your outpatient dialysis 2)Follow up with your PCP in a week 3)Monitor your blood pressure at home   Increase activity slowly   Complete by: As directed       Allergies as of 09/14/2023   No Known Allergies      Medication List     TAKE these medications    amLODipine 10 MG tablet Commonly known as: NORVASC Take 1 tablet (10 mg total) by mouth at bedtime.   aspirin 81 MG chewable tablet Chew 1 tablet (81 mg total) by mouth daily.   atorvastatin 40 MG tablet Commonly known as: LIPITOR Take 1 tablet (40 mg total) by mouth at bedtime.   hydrALAZINE 50 MG tablet Commonly known as: APRESOLINE Take 1 tablet (50 mg total) by mouth every 8 (eight) hours.   losartan 100 MG tablet Commonly known as: COZAAR Take 1 tablet (100 mg total) by mouth daily.   MIRCERA IJ Mircera   nitroGLYCERIN 0.4 MG SL tablet Commonly known as: NITROSTAT Place 0.4 mg under the tongue every 5 (five) minutes as needed for chest pain.   sevelamer carbonate  800 MG tablet Commonly known as: RENVELA Take 1,600 mg by mouth 3 (three) times daily with meals. Take 800mg  (1 tablet) with snacks   spironolactone 25 MG tablet Commonly known as: ALDACTONE Take 25 mg by mouth daily.        Follow-up Information     Fleet Contras, MD. Schedule an appointment as soon as possible for a visit in 1 week(s).   Specialty: Internal Medicine Contact information: [address]  [address] [place] [address] [phone]                No Known Allergies  Consultations: Nephrology   Procedures/Studies: DG Lumbar Spine Complete Result Date: 09/13/2023 CLINICAL DATA:  Low back pain, left hip pain.  Fell 4 days ago. EXAM: LUMBAR SPINE - COMPLETE 4+ VIEW; DG HIP (WITH OR WITHOUT PELVIS) 2-3V LEFT COMPARISON:  CT abdomen pelvis without contrast and reconstructions 07/25/2023. FINDINGS: AP pelvis and frog-leg left hip, two views: There is osteopenia. There is no evidence of left hip fracture or dislocation, no evidence of pelvic fracture, diastasis or focal bone lesions. The visualized proximal right femur is unremarkable. There are minimal degenerative features symmetrically at both hips. There are bridging osteophytes of both SI joints. Pubic symphysis is patent. There is facet hypertrophy of the lower lumbar segments. There are scattered pelvic phleboliths. The iliofemoral arteries are heavily calcified. Lumbar spine, four views: There is osteopenia without evidence of fractures. The vertebra are normal in heights. There is prominent facet hypertrophy at L4-5 and L5-S1, grade 1 anterolisthesis at both levels. The lumbar alignment is otherwise normal. The discs are normal in heights. There is mild lumbar spondylosis. There are punctate scattered nonobstructive stones in both kidneys. The abdominal aorta is heavily calcified. IMPRESSION: 1. Osteopenia without evidence of fractures. 2. Degenerative changes of the hips, SI joints and lower lumbar segments. 3. Grade 1 degenerative spondylolisthesis L4-5 and L5-S1. 4. Heavy aortic and iliofemoral atherosclerosis. 5. Punctate nonobstructive caliceal stones in both kidneys. Electronically Signed   By: Almira Bar M.D.   On: 09/13/2023 02:16   DG HIP UNILAT W OR W/O PELVIS 2-3 VIEWS LEFT Result Date: 09/13/2023 CLINICAL DATA:  Low back pain, left hip pain.  Fell 4 days ago. EXAM: LUMBAR SPINE - COMPLETE 4+ VIEW; DG HIP  (WITH OR WITHOUT PELVIS) 2-3V LEFT COMPARISON:  CT abdomen pelvis without contrast and reconstructions 07/25/2023. FINDINGS: AP pelvis and frog-leg left hip, two views: There is osteopenia. There is no evidence of left hip fracture or dislocation, no evidence of pelvic fracture, diastasis or focal bone lesions. The visualized proximal right femur is unremarkable. There are minimal degenerative features symmetrically at both hips. There are bridging osteophytes of both SI joints. Pubic symphysis is patent. There is facet hypertrophy of the lower lumbar segments. There are scattered pelvic phleboliths. The iliofemoral arteries are heavily calcified. Lumbar spine, four views: There is osteopenia without evidence of fractures. The vertebra are normal in heights. There is prominent facet hypertrophy at L4-5 and L5-S1, grade 1 anterolisthesis at both levels. The lumbar alignment is otherwise normal. The discs are normal in heights. There is mild lumbar spondylosis. There are punctate scattered nonobstructive stones in both kidneys. The abdominal aorta is heavily calcified. IMPRESSION: 1. Osteopenia without evidence of fractures. 2. Degenerative changes of the hips, SI joints and lower lumbar segments. 3. Grade 1 degenerative spondylolisthesis L4-5 and L5-S1. 4. Heavy aortic and iliofemoral atherosclerosis. 5. Punctate nonobstructive caliceal stones in both kidneys. Electronically Signed   By: Mellody Dance  Chesser M.D.   On: 09/13/2023 02:16   PERIPHERAL VASCULAR CATHETERIZATION Result Date: 08/17/2023 Outflow stenosis identified, not amenable to percutaneous intervention.  Refer to surgeon.  Ok to continue using AVG in meantime.      Subjective: Patient seen and examined at bedside today.  Hemodynamically stable.  Comfortable.  Sitting at the edge of the bed.  Just worked  with the physical therapist.  Feels ready to go home.  Medically stable for discharge.  Discharge Exam: Vitals:   09/14/23 0500 09/14/23 0601   BP: (!) 169/65 (!) 169/65  Pulse: 71   Resp:    Temp: 98 F (36.7 C)   SpO2: 100%    Vitals:   09/13/23 2133 09/13/23 2207 09/14/23 0500 09/14/23 0601  BP:  (!) 176/85 (!) 169/65 (!) 169/65  Pulse:  81 71   Resp:  18    Temp:  98.3 F (36.8 C) 98 F (36.7 C)   TempSrc:  Oral Oral   SpO2:  99% 100%   Weight: 56.6 kg     Height: 5\' 10"  (1.778 m)       General: Pt is alert, awake, not in acute distress Cardiovascular: RRR, S1/S2 +, no rubs, no gallops Respiratory: CTA bilaterally, no wheezing, no rhonchi Abdominal: Soft, NT, ND, bowel sounds + Extremities: no edema, no cyanosis, AV fistula on the left upper extremity    The results of significant diagnostics from this hospitalization (including imaging, microbiology, ancillary and laboratory) are listed below for reference.     Microbiology: Recent Results (from the past 240 hours)  MRSA Next Gen by PCR, Nasal     Status: None   Collection Time: 09/13/23  9:41 PM   Specimen: Nasal Mucosa; Nasal Swab  Result Value Ref Range Status   MRSA by PCR Next Gen NOT DETECTED NOT DETECTED Final    Comment: (NOTE) The GeneXpert MRSA Assay (FDA approved for NASAL specimens only), is one component of a comprehensive MRSA colonization surveillance program. It is not intended to diagnose MRSA infection nor to guide or monitor treatment for MRSA infections. Test performance is not FDA approved in patients less than 29 years old. Performed at [facility], [address], [place], [place] [address]      Labs: BNP (last 3 results) Recent Labs    07/10/23 1302  BNP >4,500.0*   Basic Metabolic Panel: Recent Labs  Lab 09/13/23 0257 09/14/23 0442  NA 133* 135  K 6.1* 5.0  CL 99 98  CO2 21* 25  GLUCOSE 86 79  BUN 93* 54*  CREATININE 10.73* 7.89*  CALCIUM 8.9 8.6*   Liver Function Tests: No results for input(s): "AST", "ALT", "ALKPHOS", "BILITOT", "PROT", "ALBUMIN" in the last 168 hours. No results for input(s):  "LIPASE", "AMYLASE" in the last 168 hours. No results for input(s): "AMMONIA" in the last 168 hours. CBC: Recent Labs  Lab 09/13/23 0257  WBC 6.8  NEUTROABS 4.9  HGB 9.6*  HCT 29.8*  MCV 96.8  PLT 289   Cardiac Enzymes: No results for input(s): "CKTOTAL", "CKMB", "CKMBINDEX", "TROPONINI" in the last 168 hours. BNP: Invalid input(s): "POCBNP" CBG: Recent Labs  Lab 09/13/23 0956 09/13/23 1029 09/13/23 1210 09/13/23 2142  GLUCAP 39* 136* 110* 189*   D-Dimer No results for input(s): "DDIMER" in the last 72 hours. Hgb A1c No results for input(s): "HGBA1C" in the last 72 hours. Lipid Profile No results for input(s): "CHOL", "HDL", "LDLCALC", "TRIG", "CHOLHDL", "LDLDIRECT" in the last 72 hours. Thyroid function  studies No results for input(s): "TSH", "T4TOTAL", "T3FREE", "THYROIDAB" in the last 72 hours.  Invalid input(s): "FREET3" Anemia work up No results for input(s): "VITAMINB12", "FOLATE", "FERRITIN", "TIBC", "IRON", "RETICCTPCT" in the last 72 hours. Urinalysis    Component Value Date/Time   COLORURINE YELLOW 04/12/2023 1953   APPEARANCEUR CLEAR 04/12/2023 1953   LABSPEC 1.010 04/12/2023 1953   PHURINE 9.0 (H) 04/12/2023 1953   GLUCOSEU 50 (A) 04/12/2023 1953   HGBUR SMALL (A) 04/12/2023 1953   BILIRUBINUR NEGATIVE 04/12/2023 1953   KETONESUR NEGATIVE 04/12/2023 1953   PROTEINUR 100 (A) 04/12/2023 1953   UROBILINOGEN 1.0 06/27/2010 0631   NITRITE NEGATIVE 04/12/2023 1953   LEUKOCYTESUR TRACE (A) 04/12/2023 1953   Sepsis Labs Recent Labs  Lab 09/13/23 0257  WBC 6.8   Microbiology Recent Results (from the past 240 hours)  MRSA Next Gen by PCR, Nasal     Status: None   Collection Time: 09/13/23  9:41 PM   Specimen: Nasal Mucosa; Nasal Swab  Result Value Ref Range Status   MRSA by PCR Next Gen NOT DETECTED NOT DETECTED Final    Comment: (NOTE) The GeneXpert MRSA Assay (FDA approved for NASAL specimens only), is one component of a comprehensive MRSA  colonization surveillance program. It is not intended to diagnose MRSA infection nor to guide or monitor treatment for MRSA infections. Test performance is not FDA approved in patients less than 77 years old. Performed at [facility], [address], [place], [place] [address]     Please note: You were cared for by a hospitalist during your hospital stay. Once you are discharged, your primary care physician will handle any further medical issues. Please note that NO REFILLS for any discharge medications will be authorized once you are discharged, as it is imperative that you return to your primary care physician (or establish a relationship with a primary care physician if you do not have one) for your post hospital discharge needs so that they can reassess your need for medications and monitor your lab values.    Time coordinating discharge: 40 minutes  SIGNED:   Leona Rake, MD  [facility] 09/14/2023, 10:08 AM Pager [phone]  If 7PM-7AM, please contact night-coverage [email or web address] Password [email or web address]

## 2023-09-14 NOTE — TOC Transition Note (Signed)
 Transition of Care [facility]) - Discharge Note   Patient Details  Name: Shawn Collins MRN: [id number] Date of Birth: 24-May-1951  Transition [facility]) CM/SW Contact:  Tom-Johnson, Angelique Ken, RN Phone Number: 09/14/2023, 10:26 AM   Clinical Narrative:     Patient is scheduled for discharge today.  Home health info, hospital f/u and discharge instructions on AVS. Friend, Genevia Kern to transport at discharge, if Genevia Kern does not transport, patient will be given a cab voucher at the discharge lounge.   No further TOC needs noted.      Final next level of care: Home w Home Health Services Barriers to Discharge: Barriers Resolved   Patient Goals and CMS Choice Patient states their goals for this hospitalization and ongoing recovery are:: To return home CMS Medicare.gov Compare Post Acute Care list provided to:: Patient Choice offered to / list presented to : Patient      Discharge Placement                Patient to be transferred to facility by: Friend Name of family member notified: [facility] Additional resources added to the After Visit Summary for                  DME Arranged: N/A DME Agency: NA       HH Arranged: PT, Nurse's Aide HH Agency: [facility] Date [facility] Agency Contacted: 09/14/23 Time HH Agency Contacted: 1020 Representative spoke with at [facility] Agency: Amy  Social Drivers of Health (SDOH) Interventions SDOH Screenings   Food Insecurity: No Food Insecurity (09/14/2023)  Housing: Low Risk  (09/14/2023)  Transportation Needs: No Transportation Needs (09/14/2023)  Utilities: Not At Risk (09/14/2023)  Social Connections: Moderately Isolated (09/14/2023)  Tobacco Use: Medium Risk (09/07/2023)     Readmission Risk Interventions    07/07/2022    9:49 AM  Readmission Risk Prevention Plan  Transportation Screening Complete  PCP or Specialist Appt within 3-5 Days Complete  HRI or Home Care Consult Complete  Social Work  Consult for Recovery Care Planning/Counseling --  Palliative Care Screening Not Applicable  Medication Review [employer]) Complete

## 2023-09-14 NOTE — Evaluation (Signed)
 Physical Therapy Evaluation and Discharge Patient Details Name: Shawn Collins MRN: [id number] DOB: Sep 30, 1950 Today's Date: 09/14/2023  History of Present Illness  73 yo male presenting 09/13/23 with L LE pain. Shawn Collins outside on 4/13. Xrays showed degenerative changes L hip and spine but no acute fx. Pt noted to have hyperkalemia.  PMH: COPD, DM2, HTN, Hx of NSTEMI, CKD/ESRD (HD MWF), CAD, CVA  Clinical Impression  Patient evaluated by Physical Therapy with no further acute PT needs identified. All education has been completed and the patient has no further questions. Pt from home where he live sin a boardinghouse, takes city bus to HD because the stop is right in front of the house. Has a friend that can drive him to appts and the grocery store. Walks and collects cans for exercise but has had several falls in the community. Gave him a reacher to decrease the need to bend to ground, though he was able to do this on eval without LOB. L hip and knee pain has remitted. Would benefit from HHPT for balance program.  See below for any follow-up Physical Therapy or equipment needs. PT is signing off. Thank you for this referral.         If plan is discharge home, recommend the following: Assistance with cooking/housework;Assist for transportation   Can travel by private vehicle        Equipment Recommendations None recommended by PT  Recommendations for Other Services       Functional Status Assessment Patient has had a recent decline in their functional status and demonstrates the ability to make significant improvements in function in a reasonable and predictable amount of time.     Precautions / Restrictions Precautions Precautions: Fall Recall of Precautions/Restrictions: Intact Restrictions Weight Bearing Restrictions Per Provider Order: No      Mobility  Bed Mobility Overal bed mobility: Modified Independent                  Transfers Overall transfer level: Needs  assistance Equipment used: Straight cane Transfers: Sit to/from Stand Sit to Stand: Supervision           General transfer comment: mildly unsteady with initial standing. Gave pt standing exercises to perform with initial standing instead of leaving bedside right away    Ambulation/Gait Ambulation/Gait assistance: Supervision Gait Distance (Feet): 200 Feet Assistive device: Straight cane Gait Pattern/deviations: Decreased weight shift to left Gait velocity: mildly decreased Gait velocity interpretation: >2.62 ft/sec, indicative of community ambulatory Pre-gait activities: marching in place General Gait Details: decreased L arm swing but pt can relax LUE when cued. Holds cane R hand. No LOB with level walking  Stairs Stairs: Yes Stairs assistance: Contact guard assist Stair Management: One rail Right, Step to pattern, Forwards Number of Stairs: 5 General stair comments: pt safe with use of rail  Wheelchair Mobility     Tilt Bed    Modified Rankin (Stroke Patients Only)       Balance Overall balance assessment: Needs assistance, History of Falls Sitting-balance support: No upper extremity supported, Feet supported Sitting balance-Leahy Scale: Good     Standing balance support: Single extremity supported, During functional activity Standing balance-Leahy Scale: Fair Standing balance comment: limitation evident, would benefit from balance program               High Level Balance Comments: practiced picking object up off floor with support of cane and pt was able to complete without LOB but still risky for him, will give reacher  for picking up cans             Pertinent Vitals/Pain Pain Assessment Pain Assessment: No/denies pain    Home Living Family/patient expects to be discharged to:: Private residence Living Arrangements: Other (Comment) (boardinghouse) Available Help at Discharge: Family Type of Home: House Home Access: Stairs to enter Entrance  Stairs-Rails: Right Entrance Stairs-Number of Steps: 3-4 Alternate Level Stairs-Number of Steps: flight Home Layout: Two level;1/2 bath on main level Home Equipment: Cane - single point Additional Comments: friend can help with transportation when needed    Prior Function Prior Level of Function : Independent/Modified Independent             Mobility Comments: ambulates with cane. Has had one other recent fall besides the one that led to this admission. Likes to walk and pick up cans for exercise. Takes city bus to HD because stops right in front of his house, more convenient for him than [place] ADLs Comments: has a friend that brings food occasionally but also cooks for self     Extremity/Trunk Assessment   Upper Extremity Assessment Upper Extremity Assessment:  (Decreased L arm swing due to past CVA)    Lower Extremity Assessment Lower Extremity Assessment: LLE deficits/detail LLE Deficits / Details: mildly weaker than R since CVA with decreased coordination LLE Sensation: decreased proprioception LLE Coordination: decreased gross motor    Cervical / Trunk Assessment Cervical / Trunk Assessment: Normal  Communication   Communication Communication: No apparent difficulties Factors Affecting Communication: Reduced clarity of speech    Cognition Arousal: Alert Behavior During Therapy: WFL for tasks assessed/performed   PT - Cognitive impairments: No apparent impairments                       PT - Cognition Comments: pt reports he has not been the most consistent taking his home meds. Spent some time discussing this and strategies for improvement including, filling pill box on same day each week, setting alarm on phone Following commands: Intact       Cueing Cueing Techniques: Verbal cues     General Comments      Exercises Other Exercises Other Exercises: supine hip stretches in bed to perform before getting up   Assessment/Plan    PT Assessment All  further PT needs can be met in the next venue of care  PT Problem List Decreased balance;Decreased mobility;Decreased coordination       PT Treatment Interventions      PT Goals (Current goals can be found in the Care Plan section)  Acute Rehab PT Goals Patient Stated Goal: return home PT Goal Formulation: All assessment and education complete, DC therapy    Frequency       Co-evaluation               AM-PAC PT "6 Clicks" Mobility  Outcome Measure Help needed turning from your back to your side while in a flat bed without using bedrails?: None Help needed moving from lying on your back to sitting on the side of a flat bed without using bedrails?: None Help needed moving to and from a bed to a chair (including a wheelchair)?: None Help needed standing up from a chair using your arms (e.g., wheelchair or bedside chair)?: A Little Help needed to walk in hospital room?: A Little Help needed climbing 3-5 steps with a railing? : A Little 6 Click Score: 21    End of Session   Activity Tolerance:  Patient tolerated treatment well Patient left: in bed;with call bell/phone within reach;with bed alarm set Nurse Communication: Mobility status PT Visit Diagnosis: Unsteadiness on feet (R26.81);History of falling (Z91.81)    Time: 1610-9604 PT Time Calculation (min) (ACUTE ONLY): 32 min   Charges:   PT Evaluation $PT Eval Moderate Complexity: 1 Mod PT Treatments $Gait Training: 8-22 mins PT General Charges $$ ACUTE PT VISIT: 1 Visit         Shawn Collins, PT  Acute Rehab Services Secure chat preferred Office [phone]   Shawn Collins Adonai Selsor 09/14/2023, 9:55 AM

## 2023-09-14 NOTE — Progress Notes (Signed)
 OT Cancellation Note  Patient Details Name: Shawn Collins MRN: [id number] DOB: [birth year]   Cancelled Treatment:    Reason Eval/Treat Not Completed: OT screened, no needs identified, will sign off (Pt functioning at his baseline.)  Jonette Nestle 09/14/2023, 9:38 AM Avanell Leigh, OTR/L Acute Rehabilitation Services Office: [phone]

## 2023-09-14 NOTE — Progress Notes (Signed)
 Noted pt's d/c this am. Contacted [facility] to be advised of pt's d/c today and that pt should resume care tomorrow.   Lauraine Polite Renal Navigator [phone]

## 2023-09-14 NOTE — Plan of Care (Signed)
   Problem: Education: Goal: Knowledge of General Education information will improve Description: Including pain rating scale, medication(s)/side effects and non-pharmacologic comfort measures Outcome: Progressing   Problem: Clinical Measurements: Goal: Respiratory complications will improve Outcome: Progressing   Problem: Pain Managment: Goal: General experience of comfort will improve and/or be controlled Outcome: Progressing

## 2023-09-14 NOTE — Discharge Planning (Signed)
 [facility] Patient Discharge Orders- [facility] CLINIC: [facility]  Patient's name: Shawn Collins Admit/DC Dates: 09/13/2023 - 09/14/2023  Discharge Diagnoses: Hyperkalemia     Aranesp: Given: No   Date and amount of last dose: NA  Last Hgb: 9.6 PRBC's Given: No Date/# of units: NA ESA dose for discharge: mircera 150 mcg IV q 2 weeks  IV Iron dose at discharge: Per protocol  Heparin change: No  EDW Change: No New EDW: NA  Bath Change: No  Access intervention/Change: NA Details:  Hectorol/Calcitriol change: NA  Discharge Labs: Calcium 8.6 Phosphorus NA Albumin NA K+ 5.0  IV Antibiotics: NA Details:  On Coumadin?: NA Last INR: Next INR: Managed By:   OTHER/APPTS/LAB ORDERS:    D/C Meds to be reconciled by nurse after every discharge.  Completed By: Jacobo Masters [facility] [phone]    Reviewed by: MD:______ RN_______

## 2023-09-18 ENCOUNTER — Telehealth: Payer: Self-pay

## 2023-09-18 NOTE — Telephone Encounter (Signed)
 This nurse attempted to offer 2 dates for surgery.  Patient's friend and transportation Genevia Kern) is unable to commit to scheduling on either day.  This nurse will continue to attempt to schedule when convenient for patient and transportation.

## 2023-09-25 ENCOUNTER — Telehealth: Payer: Self-pay

## 2023-09-25 NOTE — Telephone Encounter (Signed)
 Attempted to call patient to ask if he has anyone other than Shawn Collins to help him with transportation.  No answer/No VM.  Letter sent.

## 2023-09-25 NOTE — Telephone Encounter (Signed)
 Spoke with patient's friend, Genevia Kern, who provides transportation.  Have offered several appt to patient's friend, to no avail.  She either has prior engagements, other scheduled appts or will not come out early (will not accept 5:30 arrival time).  Letter mailed.

## 2023-09-25 NOTE — Telephone Encounter (Signed)
 Spoke to patient re: transportation issues.  He states that he has no one else that can assist him with getting to the hospital.  This nurse relayed information that we haven't forgotten him, it has just been challenging to schedule him for appt that is acceptable to his transportation.

## 2023-10-20 ENCOUNTER — Emergency Department (HOSPITAL_COMMUNITY): Admission: EM | Admit: 2023-10-20 | Discharge: 2023-10-20 | Disposition: A | Discharge status: Home / Self Care

## 2023-10-20 ENCOUNTER — Encounter (HOSPITAL_COMMUNITY): Payer: Self-pay

## 2023-10-20 ENCOUNTER — Other Ambulatory Visit: Payer: Self-pay

## 2023-10-20 ENCOUNTER — Emergency Department (HOSPITAL_COMMUNITY)

## 2023-10-20 DIAGNOSIS — N186 End stage renal disease: Secondary | ICD-10-CM | POA: Insufficient documentation

## 2023-10-20 DIAGNOSIS — D649 Anemia, unspecified: Secondary | ICD-10-CM | POA: Diagnosis not present

## 2023-10-20 DIAGNOSIS — Z7982 Long term (current) use of aspirin: Secondary | ICD-10-CM | POA: Diagnosis not present

## 2023-10-20 DIAGNOSIS — I120 Hypertensive chronic kidney disease with stage 5 chronic kidney disease or end stage renal disease: Secondary | ICD-10-CM | POA: Diagnosis not present

## 2023-10-20 DIAGNOSIS — I159 Secondary hypertension, unspecified: Secondary | ICD-10-CM

## 2023-10-20 DIAGNOSIS — J449 Chronic obstructive pulmonary disease, unspecified: Secondary | ICD-10-CM | POA: Insufficient documentation

## 2023-10-20 DIAGNOSIS — R030 Elevated blood-pressure reading, without diagnosis of hypertension: Secondary | ICD-10-CM | POA: Diagnosis present

## 2023-10-20 DIAGNOSIS — Z992 Dependence on renal dialysis: Secondary | ICD-10-CM | POA: Diagnosis not present

## 2023-10-20 DIAGNOSIS — Z79899 Other long term (current) drug therapy: Secondary | ICD-10-CM | POA: Insufficient documentation

## 2023-10-20 LAB — BASIC METABOLIC PANEL WITH GFR
Anion gap: 13 (ref 5–15)
BUN: 21 mg/dL (ref 8–23)
CO2: 27 mmol/L (ref 22–32)
Calcium: 8.8 mg/dL — ABNORMAL LOW (ref 8.9–10.3)
Chloride: 96 mmol/L — ABNORMAL LOW (ref 98–111)
Creatinine, Ser: 2.95 mg/dL — ABNORMAL HIGH (ref 0.61–1.24)
GFR, Estimated: 22 mL/min — ABNORMAL LOW (ref >60)
Glucose, Bld: 108 mg/dL — ABNORMAL HIGH (ref 70–99)
Potassium: 3.5 mmol/L (ref 3.5–5.1)
Sodium: 136 mmol/L (ref 135–145)

## 2023-10-20 LAB — CBC
HCT: 30.9 % — ABNORMAL LOW (ref 39.0–52.0)
Hemoglobin: 10 g/dL — ABNORMAL LOW (ref 13.0–17.0)
MCH: 30.6 pg (ref 26.0–34.0)
MCHC: 32.4 g/dL (ref 30.0–36.0)
MCV: 94.5 fL (ref 80.0–100.0)
Platelets: 210 10*3/uL (ref 150–400)
RBC: 3.27 MIL/uL — ABNORMAL LOW (ref 4.22–5.81)
RDW: 13.9 % (ref 11.5–15.5)
WBC: 4.7 10*3/uL (ref 4.0–10.5)
nRBC: 0 % (ref 0.0–0.2)

## 2023-10-20 LAB — TROPONIN I (HIGH SENSITIVITY)
Troponin I (High Sensitivity): 33 ng/L — ABNORMAL HIGH (ref <18)
Troponin I (High Sensitivity): 35 ng/L — ABNORMAL HIGH (ref <18)

## 2023-10-20 MED ORDER — HYDRALAZINE HCL 25 MG PO TABS
50.0000 mg | ORAL_TABLET | Freq: Once | ORAL | Status: AC
  Administered 2023-10-20: 50 mg via ORAL
  Filled 2023-10-20: qty 2

## 2023-10-20 MED ORDER — LOSARTAN POTASSIUM 50 MG PO TABS
100.0000 mg | ORAL_TABLET | Freq: Once | ORAL | Status: AC
  Administered 2023-10-20: 100 mg via ORAL
  Filled 2023-10-20: qty 2

## 2023-10-20 NOTE — ED Provider Notes (Signed)
 [place] EMERGENCY DEPARTMENT AT [facility] Provider Note   CSN: [id number] Arrival date & time: 10/20/23  1525     History  Chief Complaint  Patient presents with   Hypertension    Shawn Collins is a 73 y.o. male.  73 year old ESRD coming from dialysis today with elevated blood pressure.  Had some chest pain and shortness of breath during dialysis.  Resolved currently.  Has been out of his blood pressure medications for the past week.  He had the medications filled yesterday, but only took clonidine today prior to dialysis.   Hypertension       Home Medications Prior to Admission medications   Medication Sig Start Date End Date Taking? Authorizing Provider  amLODipine  (NORVASC ) 10 MG tablet Take 1 tablet (10 mg total) by mouth at bedtime. 07/20/23   [name], DO  aspirin  81 MG chewable tablet Chew 1 tablet (81 mg total) by mouth daily. 07/20/23   [name], DO  atorvastatin  (LIPITOR ) 40 MG tablet Take 1 tablet (40 mg total) by mouth at bedtime. 07/20/23   [name], DO  hydrALAZINE  (APRESOLINE ) 50 MG tablet Take 1 tablet (50 mg total) by mouth every 8 (eight) hours. 07/20/23   [name], DO  losartan  (COZAAR ) 100 MG tablet Take 1 tablet (100 mg total) by mouth daily. 07/20/23   [name], DO  Methoxy PEG-Epoetin Beta (MIRCERA IJ) Mircera 08/09/23 08/07/24  [provider]  nitroGLYCERIN  (NITROSTAT ) 0.4 MG SL tablet Place 0.4 mg under the tongue every 5 (five) minutes as needed for chest pain.    [provider]  sevelamer  carbonate (RENVELA ) 800 MG tablet Take 1,600 mg by mouth 3 (three) times daily with meals. Take 800mg  (1 tablet) with snacks 08/21/23   [provider]  spironolactone (ALDACTONE) 25 MG tablet Take 25 mg by mouth daily. 08/23/23   [provider]      Allergies    Patient has no known allergies.    Review of Systems   Review of Systems  Physical Exam Updated Vital Signs BP (!) 199/81    Pulse (!) 58   Temp 98.2 F (36.8 C) (Oral)   Resp 16   Ht 5\' 10"  (1.778 m)   Wt 56.6 kg   SpO2 100%   BMI 17.90 kg/m  Physical Exam Vitals and nursing note reviewed.  Constitutional:      General: He is not in acute distress.    Appearance: He is not toxic-appearing.  HENT:     Head: Normocephalic.  Eyes:     Conjunctiva/sclera: Conjunctivae normal.  Cardiovascular:     Rate and Rhythm: Normal rate and regular rhythm.  Pulmonary:     Effort: Pulmonary effort is normal.     Breath sounds: Normal breath sounds.  Abdominal:     General: Abdomen is flat. There is no distension.     Tenderness: There is abdominal tenderness. There is rebound. There is no guarding.  Musculoskeletal:     Right lower leg: No edema.     Left lower leg: No edema.  Skin:    General: Skin is warm.     Capillary Refill: Capillary refill takes less than 2 seconds.  Neurological:     Mental Status: He is oriented to person, place, and time.  Psychiatric:        Mood and Affect: Mood normal.        Behavior: Behavior normal.     ED Results / Procedures / Treatments  Labs (all labs ordered are listed, but only abnormal results are displayed) Labs Reviewed  BASIC METABOLIC PANEL WITH GFR - Abnormal; Notable for the following components:      Result Value   Chloride 96 (*)    Glucose, Bld 108 (*)    Creatinine, Ser 2.95 (*)    Calcium  8.8 (*)    GFR, Estimated 22 (*)    All other components within normal limits  CBC - Abnormal; Notable for the following components:   RBC 3.27 (*)    Hemoglobin 10.0 (*)    HCT 30.9 (*)    All other components within normal limits  TROPONIN I (HIGH SENSITIVITY) - Abnormal; Notable for the following components:   Troponin I (High Sensitivity) 33 (*)    All other components within normal limits  TROPONIN I (HIGH SENSITIVITY) - Abnormal; Notable for the following components:   Troponin I (High Sensitivity) 35 (*)    All other components within normal limits     EKG EKG Interpretation Date/Time:  Friday Oct 20 2023 15:59:01 EDT Ventricular Rate:  62 PR Interval:  172 QRS Duration:  89 QT Interval:  509 QTC Calculation: 517 R Axis:   16  Text Interpretation: Sinus rhythm Probable left atrial enlargement Nonspecific T abnormalities, lateral leads Prolonged QT interval Confirmed by Elise Guile [phone]) on 10/20/2023 4:29:11 PM  Radiology DG Chest Portable 1 View Result Date: 10/20/2023 CLINICAL DATA:  Hypertension EXAM: PORTABLE CHEST 1 VIEW COMPARISON:  07/10/2023 FINDINGS: Single frontal view of the chest demonstrates postsurgical changes from CABG. Stable enlargement of the cardiac silhouette. No acute airspace disease, effusion, or pneumothorax. No acute bony abnormalities. IMPRESSION: 1. Stable chest, no acute process. Electronically Signed   By: Bobbye Burrow M.D.   On: 10/20/2023 17:54    Procedures Procedures    Medications Ordered in ED Medications  hydrALAZINE  (APRESOLINE ) tablet 50 mg (50 mg Oral Given 10/20/23 1621)  losartan  (COZAAR ) tablet 100 mg (100 mg Oral Given 10/20/23 1620)    ED Course/ Medical Decision Making/ A&P                                 Medical Decision Making 73 year old male ESRD on dialysis, hypertension, CAD, COPD, history of CVA presenting emergency department for hypertension.  Blood pressure 245/92.  EMS reported patient getting 0.2 mg of clonidine today at dialysis.  Initial blood pressure 260/112 with EMS.  Patient is asymptomatic currently.  Per review of home medications we will dose his hydralazine  and Cozaar .  Will get screening labs given his complaint of chest pain and shortness of breath as well as x-ray.  Update.  Blood pressure improved 199/81 after home medications.  I suspect his noncompliance with driver of his blood pressure.  Nonfocal neuroexam.  He is symptom-free.  Workup reassuring.  Mild elevation in troponin, but flat troponin.  Appears chronically elevated likely secondary to his  ESRD.  No significant metabolic derangements.  No leukocytosis.  Stable anemia.  Chest x-ray without volume overload.  Will discharge in stable condition.  Amount and/or Complexity of Data Reviewed Labs: ordered. Radiology: ordered. ECG/medicine tests: ordered.  Risk Prescription drug management.         Final Clinical Impression(s) / ED Diagnoses Final diagnoses:  None    Rx / DC Orders ED Discharge Orders     None         Rolinda Climes, DO 10/20/23 6045

## 2023-10-20 NOTE — ED Triage Notes (Signed)
 Pt BIB [facility] from dialysis for BP consistently 200+ systolic. Pt has been out of home meds for "a few days" but picked up his meds today and has taken only his hydralazine . Pt complained of CP during dialysis treatment but none currently.  0.2 clonidine at dialysis 260/112 with EMS

## 2023-10-20 NOTE — Discharge Instructions (Signed)
 Please take your blood pressure medications.  Please follow-up with your primary doctor.  Return immediately if develop fevers, chills, chest pain, shortness of breath, lightheadedness, passout, unilateral weakness, seizures, facial droop, difficulty finding words or he develop any new or worsening symptoms that are concerning to you.

## 2023-10-31 ENCOUNTER — Ambulatory Visit: Admitting: Podiatry

## 2023-11-29 ENCOUNTER — Emergency Department (HOSPITAL_COMMUNITY)
Admission: EM | Admit: 2023-11-29 | Discharge: 2023-11-29 | Disposition: A | Discharge status: Home / Self Care | Attending: Emergency Medicine | Admitting: Emergency Medicine

## 2023-11-29 DIAGNOSIS — I120 Hypertensive chronic kidney disease with stage 5 chronic kidney disease or end stage renal disease: Secondary | ICD-10-CM | POA: Diagnosis present

## 2023-11-29 DIAGNOSIS — J449 Chronic obstructive pulmonary disease, unspecified: Secondary | ICD-10-CM | POA: Diagnosis not present

## 2023-11-29 DIAGNOSIS — Z7982 Long term (current) use of aspirin: Secondary | ICD-10-CM | POA: Insufficient documentation

## 2023-11-29 DIAGNOSIS — Z951 Presence of aortocoronary bypass graft: Secondary | ICD-10-CM | POA: Insufficient documentation

## 2023-11-29 DIAGNOSIS — Z79899 Other long term (current) drug therapy: Secondary | ICD-10-CM | POA: Diagnosis not present

## 2023-11-29 DIAGNOSIS — N186 End stage renal disease: Secondary | ICD-10-CM | POA: Diagnosis not present

## 2023-11-29 DIAGNOSIS — T82838A Hemorrhage of vascular prosthetic devices, implants and grafts, initial encounter: Secondary | ICD-10-CM | POA: Insufficient documentation

## 2023-11-29 DIAGNOSIS — I10 Essential (primary) hypertension: Secondary | ICD-10-CM

## 2023-11-29 DIAGNOSIS — Z992 Dependence on renal dialysis: Secondary | ICD-10-CM | POA: Diagnosis not present

## 2023-11-29 DIAGNOSIS — I2510 Atherosclerotic heart disease of native coronary artery without angina pectoris: Secondary | ICD-10-CM | POA: Diagnosis not present

## 2023-11-29 DIAGNOSIS — E1122 Type 2 diabetes mellitus with diabetic chronic kidney disease: Secondary | ICD-10-CM | POA: Insufficient documentation

## 2023-11-29 LAB — CBC WITH DIFFERENTIAL/PLATELET
Abs Immature Granulocytes: 0.01 10*3/uL (ref 0.00–0.07)
Basophils Absolute: 0.1 10*3/uL (ref 0.0–0.1)
Basophils Relative: 2 %
Eosinophils Absolute: 0.1 10*3/uL (ref 0.0–0.5)
Eosinophils Relative: 1 %
HCT: 46 % (ref 39.0–52.0)
Hemoglobin: 14.8 g/dL (ref 13.0–17.0)
Immature Granulocytes: 0 %
Lymphocytes Relative: 22 %
Lymphs Abs: 1 10*3/uL (ref 0.7–4.0)
MCH: 31.4 pg (ref 26.0–34.0)
MCHC: 32.2 g/dL (ref 30.0–36.0)
MCV: 97.7 fL (ref 80.0–100.0)
Monocytes Absolute: 0.5 10*3/uL (ref 0.1–1.0)
Monocytes Relative: 12 %
Neutro Abs: 2.7 10*3/uL (ref 1.7–7.7)
Neutrophils Relative %: 63 %
Platelets: 222 10*3/uL (ref 150–400)
RBC: 4.71 MIL/uL (ref 4.22–5.81)
RDW: 15.9 % — ABNORMAL HIGH (ref 11.5–15.5)
WBC: 4.4 10*3/uL (ref 4.0–10.5)
nRBC: 0 % (ref 0.0–0.2)

## 2023-11-29 LAB — BASIC METABOLIC PANEL WITH GFR
Anion gap: 11 (ref 5–15)
BUN: 16 mg/dL (ref 8–23)
CO2: 28 mmol/L (ref 22–32)
Calcium: 9.4 mg/dL (ref 8.9–10.3)
Chloride: 97 mmol/L — ABNORMAL LOW (ref 98–111)
Creatinine, Ser: 4.63 mg/dL — ABNORMAL HIGH (ref 0.61–1.24)
GFR, Estimated: 12 mL/min — ABNORMAL LOW (ref >60)
Glucose, Bld: 114 mg/dL — ABNORMAL HIGH (ref 70–99)
Potassium: 4.9 mmol/L (ref 3.5–5.1)
Sodium: 136 mmol/L (ref 135–145)

## 2023-11-29 MED ORDER — AMLODIPINE BESYLATE 5 MG PO TABS
10.0000 mg | ORAL_TABLET | Freq: Once | ORAL | Status: AC
  Administered 2023-11-29: 10 mg via ORAL
  Filled 2023-11-29: qty 2

## 2023-11-29 MED ORDER — HYDRALAZINE HCL 25 MG PO TABS
50.0000 mg | ORAL_TABLET | Freq: Once | ORAL | Status: AC
  Administered 2023-11-29: 50 mg via ORAL
  Filled 2023-11-29: qty 2

## 2023-11-29 NOTE — ED Provider Notes (Signed)
 [place] EMERGENCY DEPARTMENT AT [facility] Provider Note   CSN: [id number] Arrival date & time: 11/29/23  1614     Patient presents with: No chief complaint on file.   Shawn Collins is a 73 y.o. male with past medical history of COPD, DM, NSTEMI, ESRD on HD, CABG x 4, CAD, CVA presents to emergency department via EMS for evaluation of left fistula site bleeding.  He reports that he was at dialysis today at 1030 when his fistula started bleeding. No complaints of dizziness lightheadedness, AC  Of note, L FA loop AVG on 08/17/23 by Dr. Norine   HPI     Prior to Admission medications   Medication Sig Start Date End Date Taking? Authorizing Provider  amLODipine  (NORVASC ) 10 MG tablet Take 1 tablet (10 mg total) by mouth at bedtime. 07/20/23   Rojelio Nest, DO  aspirin  81 MG chewable tablet Chew 1 tablet (81 mg total) by mouth daily. 07/20/23   Rojelio Nest, DO  atorvastatin  (LIPITOR ) 40 MG tablet Take 1 tablet (40 mg total) by mouth at bedtime. 07/20/23   Rojelio Nest, DO  hydrALAZINE  (APRESOLINE ) 50 MG tablet Take 1 tablet (50 mg total) by mouth every 8 (eight) hours. 07/20/23   Rojelio Nest, DO  losartan  (COZAAR ) 100 MG tablet Take 1 tablet (100 mg total) by mouth daily. 07/20/23   Rojelio Nest, DO  Methoxy PEG-Epoetin Beta (MIRCERA IJ) Mircera 08/09/23 08/07/24  [provider]  nitroGLYCERIN  (NITROSTAT ) 0.4 MG SL tablet Place 0.4 mg under the tongue every 5 (five) minutes as needed for chest pain.    [provider]  sevelamer  carbonate (RENVELA ) 800 MG tablet Take 1,600 mg by mouth 3 (three) times daily with meals. Take 800mg  (1 tablet) with snacks 08/21/23   [provider]  spironolactone (ALDACTONE) 25 MG tablet Take 25 mg by mouth daily. 08/23/23   [provider]    Allergies: Patient has no known allergies.    Review of Systems  Constitutional:  Negative for chills, fatigue and fever.  Respiratory:  Negative for cough, chest  tightness, shortness of breath and wheezing.   Cardiovascular:  Negative for chest pain and palpitations.  Gastrointestinal:  Negative for abdominal pain, constipation, diarrhea, nausea and vomiting.  Neurological:  Negative for dizziness, seizures, weakness, light-headedness, numbness and headaches.    Updated Vital Signs BP 131/63   Pulse (!) 57   Temp 97.6 F (36.4 C) (Temporal)   Resp 16   SpO2 100%   Physical Exam Vitals and nursing note reviewed.  Constitutional:      General: He is not in acute distress.    Appearance: Normal appearance.  HENT:     Head: Normocephalic and atraumatic.  Eyes:     Conjunctiva/sclera: Conjunctivae normal.  Cardiovascular:     Rate and Rhythm: Normal rate.  Pulmonary:     Effort: Pulmonary effort is normal. No respiratory distress.     Breath sounds: Normal breath sounds.  Skin:    Coloration: Skin is not jaundiced or pale.     Comments: LUE fistula with punctuate hole with steady strong stream of blood controlled with direct pressure, combat gauze  Neurological:     Mental Status: He is alert and oriented to person, place, and time. Mental status is at baseline.     (all labs ordered are listed, but only abnormal results are displayed) Labs Reviewed  CBC WITH DIFFERENTIAL/PLATELET - Abnormal; Notable for the following components:      Result  Value   RDW 15.9 (*)    All other components within normal limits  BASIC METABOLIC PANEL WITH GFR - Abnormal; Notable for the following components:   Chloride 97 (*)    Glucose, Bld 114 (*)    Creatinine, Ser 4.63 (*)    GFR, Estimated 12 (*)    All other components within normal limits    EKG: None  Radiology: No results found.   Medications Ordered in the ED  amLODipine  (NORVASC ) tablet 10 mg (10 mg Oral Given 11/29/23 1923)  hydrALAZINE  (APRESOLINE ) tablet 50 mg (50 mg Oral Given 11/29/23 1923)                                    Medical Decision Making Amount and/or Complexity  of Data Reviewed Labs: ordered.  Risk Prescription drug management.   Patient presents to the ED for concern of bleeding from fistula, this involves an extensive number of treatment options, and is a complaint that carries with it a high risk of complications and morbidity.     Co morbidities that complicate the patient evaluation  fistula   Additional history obtained:  Additional history obtained from EMS, Nursing, and Outside Medical Records   External records from outside source obtained and reviewed including triage RN note, fistula placement procedure note on 08/17/2023   Lab Tests:  I Ordered, and personally interpreted labs.  The pertinent results include:   Hgb 14.8 Creatinine 4.63 (ESRD on HD)     Medicines ordered and prescription drug management:  I ordered medication including hydralazine , amlodipine  for HTN Reevaluation of the patient after these medicines showed that the patient improved I have reviewed the patients home medicines and have made adjustments as needed     Consultations Obtained:  I requested consultation with vascular Dr. Serene,  and discussed lab and imaging findings as well as pertinent plan - no additional recommendations if bleeding is controlled with direct pressure   Problem List / ED Course:  Fistula bleeding No pulsatile but strong consistent stream Hemodynamically stable. No anemia Hemorrhage controlled with combat gauze, elevation, and direct pressure.  Evaluated him for six hours following with no further bleeding. No complaints of lightheadedness, dizziness HTN No visual disturbances no chest pain. Neurologically intact with no signs of acute EOD Provided him his home medications that he did not take yet of amlodipine , hydralazine  improving BP   Reevaluation:  After the interventions noted above, I reevaluated the patient and found that they have :resolved    Dispostion:  After consideration of the diagnostic  results and the patients response to treatment, I feel that the patent would benefit from outpatient management.   Discussed ED workup, disposition, return to ED precautions with patient who expresses understanding agrees with plan.  All questions answered to their satisfaction.  They are agreeable to plan.  Discharge instructions provided on paperwork  Dr. Armenta individually assessed patient and agrees with plan  Final diagnoses:  Asymptomatic hypertension  Bleeding from dialysis shunt, initial encounter [facility])    ED Discharge Orders     None          Minnie Tinnie BRAVO, PA 11/29/23 2244    Armenta Canning, MD 12/07/23 1814

## 2023-11-29 NOTE — Discharge Instructions (Addendum)
 Thank you for letting us  evaluate you.  We have started her fistula from bleeding with, gauze, direct pressure, elevation.  Please keep this on for the next 12 hours at least to ensure there is no additional bleeding but must not be on longer than 24 hrs total. You can take off tomorrow morning  We have also given you a dose of your amlodipine , hydralazine .  Please dont take these tonight. You may take your other nighttime meds

## 2023-11-29 NOTE — ED Provider Notes (Signed)
 I provided a substantive portion of the care of this patient.  I personally made/approved the management plan for this patient and take responsibility for the patient management.     Patient came directly from dialysis center.  He had bleeding fistula which they had tried to manage with pressure at the dialysis center.  EMS applied bulky dressing and pressure clamp.  On examination patient has significant blood soaking through an ACE wrap and bulky dressing.  Dressing and Ace wrap removed.  Patient has a steady pressurized stream of blood from the access site.  I could control this with direct pressure.  With release of pressure there is resumption of steady pressurized blood stream.  Combat gauze directly applied over the bleeding site with pressure and no immediate bleeding through.  Pressure maintained by nursing staff for 5 minutes and then transition to bulky dressing with Ace.  I rechecked and at this time there is no repeat bleeding through.  Will continue to evaluate.   Armenta Canning, MD 12/07/23 [phone]

## 2023-11-29 NOTE — ED Triage Notes (Addendum)
 Pt BIb [facility] from Dialysis (MWF) because 2 sites would not stop bleeding x 1 hr at center.  EMS got bleeding controlled.  PT has the clamp on at this time.  Per EMS pt is also non-compliant with BP meds and was over 200 systolic on scene.  Pt is 184/96 currently.  HR 565 100% PT was given a Klonidine at center for BP  PT has no complaints at this time.

## 2023-11-29 NOTE — ED Notes (Signed)
 Nurse notified about BP

## 2023-11-29 NOTE — ED Notes (Signed)
 Pressure dressing at left forearm intact .

## 2023-12-23 ENCOUNTER — Encounter (HOSPITAL_COMMUNITY): Payer: Self-pay

## 2023-12-23 ENCOUNTER — Other Ambulatory Visit: Payer: Self-pay

## 2023-12-23 ENCOUNTER — Emergency Department (HOSPITAL_COMMUNITY)

## 2023-12-23 ENCOUNTER — Emergency Department (HOSPITAL_COMMUNITY)
Admission: EM | Admit: 2023-12-23 | Discharge: 2023-12-23 | Disposition: A | Discharge status: Home / Self Care | Attending: Emergency Medicine | Admitting: Emergency Medicine

## 2023-12-23 DIAGNOSIS — S92355A Nondisplaced fracture of fifth metatarsal bone, left foot, initial encounter for closed fracture: Secondary | ICD-10-CM | POA: Insufficient documentation

## 2023-12-23 DIAGNOSIS — Z79899 Other long term (current) drug therapy: Secondary | ICD-10-CM | POA: Diagnosis not present

## 2023-12-23 DIAGNOSIS — I10 Essential (primary) hypertension: Secondary | ICD-10-CM | POA: Diagnosis not present

## 2023-12-23 DIAGNOSIS — M25572 Pain in left ankle and joints of left foot: Secondary | ICD-10-CM | POA: Diagnosis present

## 2023-12-23 DIAGNOSIS — X501XXA Overexertion from prolonged static or awkward postures, initial encounter: Secondary | ICD-10-CM | POA: Insufficient documentation

## 2023-12-23 DIAGNOSIS — Z7982 Long term (current) use of aspirin: Secondary | ICD-10-CM | POA: Insufficient documentation

## 2023-12-23 MED ORDER — HYDRALAZINE HCL 25 MG PO TABS
50.0000 mg | ORAL_TABLET | Freq: Once | ORAL | Status: AC
  Administered 2023-12-23: 50 mg via ORAL
  Filled 2023-12-23: qty 2

## 2023-12-23 MED ORDER — AMLODIPINE BESYLATE 5 MG PO TABS: 10.0000 mg | ORAL_TABLET | Freq: Every day | ORAL | Status: DC

## 2023-12-23 MED ORDER — AMLODIPINE BESYLATE 5 MG PO TABS
5.0000 mg | ORAL_TABLET | Freq: Once | ORAL | Status: AC
  Administered 2023-12-23: 5 mg via ORAL
  Filled 2023-12-23: qty 1

## 2023-12-23 MED ORDER — HYDRALAZINE HCL 25 MG PO TABS: 50.0000 mg | ORAL_TABLET | Freq: Three times a day (TID) | ORAL | Status: DC

## 2023-12-23 NOTE — ED Provider Notes (Signed)
 [place] EMERGENCY DEPARTMENT AT [facility] Provider Note   CSN: [id number] Arrival date & time: 12/23/23  1019     Patient presents with: Ankle Pain (Left)   Shawn Collins is a 73 y.o. male presents today after stumbling and hurting his left ankle.  Patient reports tenderness to the medial left ankle and lateral left foot.  Patient denies head injury, blood thinners, dizziness, or any other injuries at this time.  Patient states his last dialysis was on Friday and he has not taken his hypertension medications today.    Ankle Pain      Prior to Admission medications   Medication Sig Start Date End Date Taking? Authorizing Provider  amLODipine  (NORVASC ) 10 MG tablet Take 1 tablet (10 mg total) by mouth at bedtime. 07/20/23   Rojelio Nest, DO  aspirin  81 MG chewable tablet Chew 1 tablet (81 mg total) by mouth daily. 07/20/23   Rojelio Nest, DO  atorvastatin  (LIPITOR ) 40 MG tablet Take 1 tablet (40 mg total) by mouth at bedtime. 07/20/23   Rojelio Nest, DO  hydrALAZINE  (APRESOLINE ) 50 MG tablet Take 1 tablet (50 mg total) by mouth every 8 (eight) hours. 07/20/23   Rojelio Nest, DO  losartan  (COZAAR ) 100 MG tablet Take 1 tablet (100 mg total) by mouth daily. 07/20/23   Rojelio Nest, DO  Methoxy PEG-Epoetin Beta (MIRCERA IJ) Mircera 08/09/23 08/07/24  [provider]  nitroGLYCERIN  (NITROSTAT ) 0.4 MG SL tablet Place 0.4 mg under the tongue every 5 (five) minutes as needed for chest pain.    [provider]  sevelamer  carbonate (RENVELA ) 800 MG tablet Take 1,600 mg by mouth 3 (three) times daily with meals. Take 800mg  (1 tablet) with snacks 08/21/23   [provider]  spironolactone (ALDACTONE) 25 MG tablet Take 25 mg by mouth daily. 08/23/23   [provider]    Allergies: Patient has no known allergies.    Review of Systems  Musculoskeletal:  Positive for arthralgias.    Updated Vital Signs BP (!) 212/73 (BP Location: Right Arm)    Pulse 68   Temp 97.7 F (36.5 C) (Oral)   Resp 20   Ht 5' 10 (1.778 m)   Wt 56.6 kg   SpO2 100%   BMI 17.90 kg/m   Physical Exam Vitals and nursing note reviewed.  Constitutional:      General: He is not in acute distress.    Appearance: Normal appearance. He is well-developed. He is not ill-appearing.  HENT:     Head: Normocephalic and atraumatic.     Right Ear: External ear normal.     Left Ear: External ear normal.     Nose: Nose normal.     Mouth/Throat:     Mouth: Mucous membranes are moist.     Pharynx: Oropharynx is clear.  Eyes:     Conjunctiva/sclera: Conjunctivae normal.     Pupils: Pupils are equal, round, and reactive to light.  Cardiovascular:     Rate and Rhythm: Normal rate and regular rhythm.     Pulses: Normal pulses.  Pulmonary:     Effort: Pulmonary effort is normal. No respiratory distress.  Abdominal:     Palpations: Abdomen is soft.     Tenderness: There is no abdominal tenderness.  Musculoskeletal:        General: Tenderness present. No swelling or deformity.     Cervical back: Neck supple.     Comments: Patient with tenderness to palpation of the proximal left fifth  metatarsal and medial left malleolus.  Patient denies tenderness to palpation of the lateral malleolus or navicular.  Patient has no swelling, ecchymosis, or deformity noted on exam.  Patient is neurovascularly intact.  Patient's ROM limited due to pain.  Skin:    General: Skin is warm and dry.     Capillary Refill: Capillary refill takes less than 2 seconds.     Findings: No bruising.  Neurological:     General: No focal deficit present.     Mental Status: He is alert and oriented to person, place, and time.  Psychiatric:        Mood and Affect: Mood normal.     (all labs ordered are listed, but only abnormal results are displayed) Labs Reviewed - No data to display  EKG: None  Radiology: DG Ankle Complete Left Result Date: 12/23/2023 CLINICAL DATA:  Twisted ankle  yesterday.  Ankle pain and swelling. EXAM: LEFT ANKLE COMPLETE - 3+ VIEW COMPARISON:  Left foot radiographs also obtained today FINDINGS: There is no evidence of ankle fracture, dislocation, or joint effusion. There is no evidence of arthropathy. Generalized osteopenia severe peripheral vascular calcification seen. Nondisplaced fracture of the 5th metatarsal base noted, as better visualized on dedicated foot radiograph reported separately. IMPRESSION: No evidence of ankle fracture or dislocation. Nondisplaced fracture of 5th metatarsal base, as better visualized on dedicated foot radiographs reported separately. Electronically Signed   By: Norleen DELENA Kil M.D.   On: 12/23/2023 11:27   DG Foot Complete Left Result Date: 12/23/2023 CLINICAL DATA:  Twisted foot yesterday.  Foot pain. EXAM: LEFT FOOT - COMPLETE 3+ VIEW COMPARISON:  None Available. FINDINGS: Nondisplaced fracture is seen through the base of the 5th metatarsal. No other fractures identified. Generalized osteopenia and severe peripheral vascular calcification noted. IMPRESSION: Nondisplaced fracture through base of 5th metatarsal. Electronically Signed   By: Norleen DELENA Kil M.D.   On: 12/23/2023 11:25     Procedures   Medications Ordered in the ED  amLODipine  (NORVASC ) tablet 5 mg (5 mg Oral Given 12/23/23 1113)  hydrALAZINE  (APRESOLINE ) tablet 50 mg (50 mg Oral Given 12/23/23 1111)                                    Medical Decision Making Amount and/or Complexity of Data Reviewed Radiology: ordered.  Risk Prescription drug management.   This patient presents to the ED for concern of left foot/ankle injury differential diagnosis includes musculoskeletal pain, fracture, dislocation   Additional history obtained   Additional history obtained from Electronic Medical Record External records from outside source obtained and reviewed including previous hospital admission   Imaging Studies ordered:  I ordered imaging studies  including left foot and ankle x-rays I independently visualized and interpreted imaging which showed nondisplaced fifth metatarsal base fracture I agree with the radiologist interpretation   Medicines ordered and prescription drug management:  I ordered medication including hydralazine  and amlodipine     I have reviewed the patients home medicines and have made adjustments as needed   Problem List / ED Course:  Patient made to be hypertensive while in ED and states that he has not taken his hypertension medications, patient's at home medications ordered. Patient placed in cam walker boot. Considered for admission or further workup however patient's vital signs, physical exam, and imaging are reassuring.  Patient's hypertension while in ED likely due to not taking his hypertension medication while in ED.  Patient is asymptomatic and has no signs or symptoms concerning for hypertensive emergency.  Patient placed in cam walker boot and advised to take Motrin as needed for pain.  Patient to follow-up with orthopedics for further evaluation or workup.  Patient given return precautions.  I feel patient safe for discharge at this time.        Final diagnoses:  Nondisplaced fracture of fifth metatarsal bone, left foot, initial encounter for closed fracture    ED Discharge Orders     None          Francis Ileana SAILOR, PA-C 12/23/23 1316    Charlyn Sora, MD 12/24/23 [phone]

## 2023-12-23 NOTE — ED Triage Notes (Signed)
 Pt coming in from home. Pt stumbles last night possible sprain to left ankle . Pt complaining of pain and tenderness in left ankle.  Bp 148/78 Pulse 78 Rr 18  97% on ra.

## 2023-12-23 NOTE — Progress Notes (Signed)
 Orthopedic Tech Progress Note Patient Details:  Shawn Collins 14-Aug-1950 [id number]  Ortho Devices Type of Ortho Device: Postop shoe/boot Ortho Device/Splint Location: Left foot Ortho Device/Splint Interventions: Application   Post Interventions Patient Tolerated: Well  Shawn Collins 12/23/2023, 12:30 PM

## 2023-12-23 NOTE — Discharge Instructions (Addendum)
 Today you were seen for a left foot injury.  You were found to have a fracture of your fifth metatarsal.  Please rest, ice, compress, and elevate the affected limb.  Please follow-up with orthopedics for further evaluation and workup.  Thank you for letting us  treat you today. After reviewing your labs and imaging, I feel you are safe to go home. Please follow up with your PCP in the next several days and provide them with your records from this visit. Return to the Emergency Room if pain becomes severe or symptoms worsen.

## 2023-12-23 NOTE — ED Notes (Signed)
 Patient transported to X-ray

## 2024-01-10 ENCOUNTER — Encounter (HOSPITAL_COMMUNITY): Payer: Self-pay

## 2024-01-10 ENCOUNTER — Emergency Department (HOSPITAL_COMMUNITY)
Admission: EM | Admit: 2024-01-10 | Discharge: 2024-01-10 | Disposition: A | Discharge status: Home / Self Care | Attending: Student | Admitting: Student

## 2024-01-10 DIAGNOSIS — I2510 Atherosclerotic heart disease of native coronary artery without angina pectoris: Secondary | ICD-10-CM | POA: Diagnosis not present

## 2024-01-10 DIAGNOSIS — J449 Chronic obstructive pulmonary disease, unspecified: Secondary | ICD-10-CM | POA: Insufficient documentation

## 2024-01-10 DIAGNOSIS — Z951 Presence of aortocoronary bypass graft: Secondary | ICD-10-CM | POA: Diagnosis not present

## 2024-01-10 DIAGNOSIS — T82838A Hemorrhage of vascular prosthetic devices, implants and grafts, initial encounter: Secondary | ICD-10-CM | POA: Insufficient documentation

## 2024-01-10 DIAGNOSIS — I120 Hypertensive chronic kidney disease with stage 5 chronic kidney disease or end stage renal disease: Secondary | ICD-10-CM | POA: Diagnosis not present

## 2024-01-10 DIAGNOSIS — E1122 Type 2 diabetes mellitus with diabetic chronic kidney disease: Secondary | ICD-10-CM | POA: Insufficient documentation

## 2024-01-10 DIAGNOSIS — Z8616 Personal history of COVID-19: Secondary | ICD-10-CM | POA: Insufficient documentation

## 2024-01-10 DIAGNOSIS — D649 Anemia, unspecified: Secondary | ICD-10-CM | POA: Diagnosis not present

## 2024-01-10 DIAGNOSIS — Y732 Prosthetic and other implants, materials and accessory gastroenterology and urology devices associated with adverse incidents: Secondary | ICD-10-CM | POA: Diagnosis not present

## 2024-01-10 DIAGNOSIS — Z79899 Other long term (current) drug therapy: Secondary | ICD-10-CM | POA: Diagnosis not present

## 2024-01-10 DIAGNOSIS — Z992 Dependence on renal dialysis: Secondary | ICD-10-CM | POA: Diagnosis not present

## 2024-01-10 DIAGNOSIS — Z7982 Long term (current) use of aspirin: Secondary | ICD-10-CM | POA: Insufficient documentation

## 2024-01-10 DIAGNOSIS — T829XXA Unspecified complication of cardiac and vascular prosthetic device, implant and graft, initial encounter: Secondary | ICD-10-CM

## 2024-01-10 DIAGNOSIS — N186 End stage renal disease: Secondary | ICD-10-CM | POA: Diagnosis not present

## 2024-01-10 DIAGNOSIS — Z87891 Personal history of nicotine dependence: Secondary | ICD-10-CM | POA: Diagnosis not present

## 2024-01-10 LAB — CBC WITH DIFFERENTIAL/PLATELET
Abs Immature Granulocytes: 0.02 K/uL (ref 0.00–0.07)
Basophils Absolute: 0.1 K/uL (ref 0.0–0.1)
Basophils Relative: 2 %
Eosinophils Absolute: 0.1 K/uL (ref 0.0–0.5)
Eosinophils Relative: 2 %
HCT: 34 % — ABNORMAL LOW (ref 39.0–52.0)
Hemoglobin: 11.1 g/dL — ABNORMAL LOW (ref 13.0–17.0)
Immature Granulocytes: 0 %
Lymphocytes Relative: 15 %
Lymphs Abs: 0.8 K/uL (ref 0.7–4.0)
MCH: 30.6 pg (ref 26.0–34.0)
MCHC: 32.6 g/dL (ref 30.0–36.0)
MCV: 93.7 fL (ref 80.0–100.0)
Monocytes Absolute: 0.7 K/uL (ref 0.1–1.0)
Monocytes Relative: 13 %
Neutro Abs: 3.6 K/uL (ref 1.7–7.7)
Neutrophils Relative %: 68 %
Platelets: 233 K/uL (ref 150–400)
RBC: 3.63 MIL/uL — ABNORMAL LOW (ref 4.22–5.81)
RDW: 14 % (ref 11.5–15.5)
WBC: 5.3 K/uL (ref 4.0–10.5)
nRBC: 0 % (ref 0.0–0.2)

## 2024-01-10 LAB — CBG MONITORING, ED: Glucose-Capillary: 93 mg/dL (ref 70–99)

## 2024-01-10 LAB — PROTIME-INR
INR: 1.2 (ref 0.8–1.2)
Prothrombin Time: 15.5 s — ABNORMAL HIGH (ref 11.4–15.2)

## 2024-01-10 LAB — COMPREHENSIVE METABOLIC PANEL WITH GFR
ALT: 22 U/L (ref 0–44)
AST: 33 U/L (ref 15–41)
Albumin: 3.5 g/dL (ref 3.5–5.0)
Alkaline Phosphatase: 56 U/L (ref 38–126)
Anion gap: 12 (ref 5–15)
BUN: 16 mg/dL (ref 8–23)
CO2: 29 mmol/L (ref 22–32)
Calcium: 8.6 mg/dL — ABNORMAL LOW (ref 8.9–10.3)
Chloride: 97 mmol/L — ABNORMAL LOW (ref 98–111)
Creatinine, Ser: 3.49 mg/dL — ABNORMAL HIGH (ref 0.61–1.24)
GFR, Estimated: 18 mL/min — ABNORMAL LOW (ref >60)
Glucose, Bld: 67 mg/dL — ABNORMAL LOW (ref 70–99)
Potassium: 4.5 mmol/L (ref 3.5–5.1)
Sodium: 138 mmol/L (ref 135–145)
Total Bilirubin: 1 mg/dL (ref 0.0–1.2)
Total Protein: 7.7 g/dL (ref 6.5–8.1)

## 2024-01-10 MED ORDER — SPIRONOLACTONE 12.5 MG HALF TABLET
25.0000 mg | ORAL_TABLET | Freq: Every day | ORAL | Status: DC
  Administered 2024-01-10 (×2): 25 mg via ORAL
  Filled 2024-01-10: qty 2

## 2024-01-10 MED ORDER — AMLODIPINE BESYLATE 5 MG PO TABS
10.0000 mg | ORAL_TABLET | Freq: Every day | ORAL | Status: DC
  Administered 2024-01-10 (×2): 10 mg via ORAL
  Filled 2024-01-10: qty 2

## 2024-01-10 MED ORDER — LOSARTAN POTASSIUM 50 MG PO TABS
100.0000 mg | ORAL_TABLET | Freq: Every day | ORAL | Status: DC
  Administered 2024-01-10 (×2): 100 mg via ORAL
  Filled 2024-01-10: qty 2

## 2024-01-10 MED ORDER — HYDRALAZINE HCL 25 MG PO TABS
50.0000 mg | ORAL_TABLET | Freq: Three times a day (TID) | ORAL | Status: DC
  Administered 2024-01-10 (×2): 50 mg via ORAL
  Filled 2024-01-10: qty 2

## 2024-01-10 MED ORDER — LIDOCAINE-EPINEPHRINE (PF) 2 %-1:200000 IJ SOLN
INTRAMUSCULAR | Status: AC
  Filled 2024-01-10: qty 20

## 2024-01-10 NOTE — ED Triage Notes (Addendum)
 Documented triage note on wrong patient

## 2024-01-10 NOTE — ED Provider Notes (Signed)
 [place] EMERGENCY DEPARTMENT AT [facility] Provider Note  CSN: [id number] Arrival date & time: 01/10/24 1628  Chief Complaint(s) No chief complaint on file.  HPI Shawn Collins is a 73 y.o. male with PMH ESRD on hemodialysis Monday Wednesday Friday, CAD, COPD, previous CVA, T2DM, HTN who presents emerged part for evaluation of persistent bleeding from his AV fistula.  Patient states that this has happened before and he completed his entire dialysis treatment today but upon decannulation they were unable to control the bleeding.  Patient arrives with a compressive dressing in place.  Denies numbness, tingling, weakness of the upper extremity.  Denies chest pain, shortness of breath, abdominal pain, nausea, vomiting or other systemic symptoms.   Past Medical History Past Medical History:  Diagnosis Date   CAD (coronary artery disease), native coronary artery    s/p NSTEMI with cath showing severe 3VD s/p  CABG x 3 with LIMA to LAD, SVG to OM1/OM2 and SVG D1 by Dr. Army on 10/26/16.   COPD (chronic obstructive pulmonary disease) (HCC) 09/10/2014   CVA (cerebral vascular accident) (HCC) 12/18/2021   Diabetes mellitus without complication (HCC)    Type II   ESRD (end stage renal disease) (HCC)    Hyperlipidemia LDL goal <70 04/17/2017   Hypertension    Patient Active Problem List   Diagnosis Date Noted   HTN (hypertension) 09/13/2023   COVID-19 07/10/2023   Hyperkalemia 07/10/2023   Metabolic acidosis with increased anion gap and accumulation of organic acids 07/10/2023   Hypertensive urgency 07/10/2023   Hypoglycemia 07/10/2023   Weakness 07/10/2023   Syncope 07/05/2022   Syncope and collapse 07/04/2022   Type 2 diabetes mellitus (HCC) 07/04/2022   Anemia of chronic disease 07/04/2022   Acute metabolic encephalopathy due to hypoglycemia 12/30/2021   Hypothermia 12/30/2021   Uncontrolled type 2 diabetes mellitus with hypoglycemia, with long-term current use of  insulin  (HCC) 12/30/2021   History of CVA (cerebrovascular accident) 12/30/2021   DNR (do not resuscitate) 12/30/2021   Stroke determined by clinical assessment (HCC) 12/18/2021   ESRD (end stage renal disease) on dialysis (HCC) 02/23/2021   DOE (dyspnea on exertion) 11/22/2017   Hyperlipidemia LDL goal <70 04/17/2017   CAD (coronary artery disease), native coronary artery    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 09/10/2014   Diabetes mellitus without complication (HCC) 09/10/2014   Hypertension 09/10/2014   Emphysema lung (HCC) 09/10/2014   Home Medication(s) Prior to Admission medications   Medication Sig Start Date End Date Taking? Authorizing Provider  amLODipine  (NORVASC ) 10 MG tablet Take 1 tablet (10 mg total) by mouth at bedtime. 07/20/23   Rojelio Nest, DO  aspirin  81 MG chewable tablet Chew 1 tablet (81 mg total) by mouth daily. 07/20/23   Rojelio Nest, DO  atorvastatin  (LIPITOR ) 40 MG tablet Take 1 tablet (40 mg total) by mouth at bedtime. 07/20/23   Rojelio Nest, DO  hydrALAZINE  (APRESOLINE ) 50 MG tablet Take 1 tablet (50 mg total) by mouth every 8 (eight) hours. 07/20/23   Rojelio Nest, DO  losartan  (COZAAR ) 100 MG tablet Take 1 tablet (100  mg total) by mouth daily. 07/20/23   Rojelio Nest, DO  Methoxy PEG-Epoetin Beta (MIRCERA IJ) Mircera 08/09/23 08/07/24  [provider]  nitroGLYCERIN  (NITROSTAT ) 0.4 MG SL tablet Place 0.4 mg under the tongue every 5 (five) minutes as needed for chest pain.    [provider]  sevelamer  carbonate (RENVELA ) 800 MG  tablet Take 1,600 mg by mouth 3 (three) times daily with meals. Take 800mg  (1 tablet) with snacks 08/21/23   [provider]  spironolactone  (ALDACTONE ) 25 MG tablet Take 25 mg by mouth daily. 08/23/23   [provider]                                                                                                                                    Past Surgical History Past Surgical History:  Procedure Laterality Date   A/V SHUNT INTERVENTION N/A 08/17/2023   Procedure: A/V SHUNT INTERVENTION;  Surgeon: Norine Manuelita LABOR, MD;  Location: [facility] INVASIVE CV LAB;  Service: Cardiovascular;  Laterality: N/A;   AV FISTULA PLACEMENT Left 12/21/2021   Procedure: INSERTION OF LEFT ARM ARTERIOVENOUS (AV) GORE-TEX GRAFT;  Surgeon: Eliza Lonni RAMAN, MD;  Location: [facility] OR;  Service: Vascular;  Laterality: Left;   CORONARY ARTERY BYPASS GRAFT N/A 10/26/2016   Procedure: CORONARY ARTERY BYPASS GRAFTING (CABG) x 4 USING LEFT INTERNAL MAMMARY ARTERY TO LAD AND ENDOSCOPICALLY HARVESTED GREATER SAPHENOUS VEIN TO OM 1, 2 AND TO DIAG;  Surgeon: Army Dallas NOVAK, MD;  Location: [facility] OR;  Service: Open Heart Surgery;  Laterality: N/A;   ENDOVEIN HARVEST OF GREATER SAPHENOUS VEIN Right 10/26/2016   Procedure: ENDOVEIN HARVEST OF GREATER SAPHENOUS VEIN;  Surgeon: Army Dallas NOVAK, MD;  Location: [facility] OR;  Service: Open Heart Surgery;  Laterality: Right;   IR THORACENTESIS ASP PLEURAL SPACE W/IMG GUIDE  11/03/2016   LEFT HEART CATH AND CORONARY ANGIOGRAPHY N/A 10/21/2016   Procedure: Left Heart Cath and Coronary Angiography;  Surgeon: [place]az[place], Peter M, MD;  Location: [facility] INVASIVE CV LAB;  Service: Cardiovascular;  Laterality: N/A;   TEE WITHOUT CARDIOVERSION N/A 10/26/2016   Procedure: TRANSESOPHAGEAL ECHOCARDIOGRAM (TEE);  Surgeon: Army Dallas NOVAK, MD;  Location: [facility] OR;  Service: Open Heart Surgery;  Laterality: N/A;   Family History Family History  Problem Relation Age of Onset   Diabetes Mellitus II  Mother    Stroke Father    Hypertension Father    Diabetes Mellitus II Sister     Social History Social History   Tobacco Use   Smoking status: Former    Current packs/day: 0.00    Average packs/day: 0.3 packs/day for 8.0 years (2.0 ttl pk-yrs)    Types: Cigarettes    Start date: 1964    Quit date: 38    Years since quitting: 53.6   Smokeless tobacco: Never  Vaping Use   Vaping status: Never Used  Substance Use Topics   Alcohol use: Not Currently  Drug use: Not Currently   Allergies Patient has no known allergies.  Review of Systems Review of Systems  All other systems reviewed and are negative.   Physical Exam Vital Signs  I have reviewed the triage vital signs BP (!) 176/76   Pulse 71   Temp (!) 97.5 F (36.4 C) (Oral)   Resp 16   Ht 5' 10 (1.778 m)   Wt 56.2 kg   SpO2 100%   BMI 17.79 kg/m   Physical Exam Constitutional:      General: He is not in acute distress.    Appearance: Normal appearance.  HENT:     Head: Normocephalic and atraumatic.     Nose: No congestion or rhinorrhea.  Eyes:     General:        Right eye: No discharge.        Left eye: No discharge.     Extraocular Movements: Extraocular movements intact.     Pupils: Pupils are equal, round, and reactive to light.  Cardiovascular:     Rate and Rhythm: Normal rate and regular rhythm.     Heart sounds: No murmur heard. Pulmonary:     Effort: No respiratory distress.     Breath sounds: No wheezing or rales.  Abdominal:     General: There is no distension.     Tenderness: There is no abdominal tenderness.  Musculoskeletal:        General: Normal range of motion.     Cervical back: Normal range of motion.     Comments: Persistent bleeding from left AV fistula  Skin:    General: Skin is warm and dry.  Neurological:     General: No focal deficit present.     Mental Status: He is alert.     ED Results and Treatments Labs (all labs ordered are listed, but only abnormal  results are displayed) Labs Reviewed  COMPREHENSIVE METABOLIC PANEL WITH GFR - Abnormal; Notable for the following components:      Result Value   Chloride 97 (*)    Glucose, Bld 67 (*)    Creatinine, Ser 3.49 (*)    Calcium  8.6 (*)    GFR, Estimated 18 (*)    All other components within normal limits  CBC WITH DIFFERENTIAL/PLATELET - Abnormal; Notable for the following components:   RBC 3.63 (*)    Hemoglobin 11.1 (*)    HCT 34.0 (*)    All other components within normal limits  PROTIME-INR - Abnormal; Notable for the following components:   Prothrombin Time 15.5 (*)    All other components within normal limits  CBG MONITORING, ED                                                                                                                          Radiology No results found.  Pertinent labs & imaging results that were available during my care of the patient were reviewed by me and considered in  my medical decision making (see MDM for details).  Medications Ordered in ED Medications  amLODipine  (NORVASC ) tablet 10 mg (10 mg Oral Given 01/10/24 1648)  hydrALAZINE  (APRESOLINE ) tablet 50 mg (50 mg Oral Given 01/10/24 1648)  losartan  (COZAAR ) tablet 100 mg (100 mg Oral Given 01/10/24 1648)  spironolactone  (ALDACTONE ) tablet 25 mg (25 mg Oral Given 01/10/24 1648)  lidocaine -EPINEPHrine  (XYLOCAINE  W/EPI) 2 %-1:200000 (PF) injection (  Given 01/10/24 1945)                                                                                                                                     Procedures .Laceration Repair  Date/Time: 01/10/2024 10:30 PM  Performed by: Albertina Dixon, MD Authorized by: Albertina Dixon, MD   Anesthesia:    Anesthesia method:  Local infiltration   Local anesthetic:  Lidocaine  2% WITH epi Laceration details:    Location:  Shoulder/arm   Shoulder/arm location:  L upper arm   Length (cm):  0.5 Skin repair:    Repair method:  Sutures   Suture size:  3-0    Suture material:  Plain gut   Suture technique:  Figure eight   Number of sutures:  2 Approximation:    Approximation:  Close Repair type:    Repair type:  Simple Post-procedure details:    Dressing:  Non-adherent dressing   Procedure completion:  Tolerated well, no immediate complications   (including critical care time)  Medical Decision Making / ED Course   This patient presents to the ED for concern of AV fistula bleed, this involves an extensive number of treatment options, and is a complaint that carries with it a high risk of complications and morbidity.  The differential diagnosis includes coagulopathy, skin defect from cannulation, laceration  MDM: Patient seen in the emergency department for a evaluation of a bleeding AV fistula.  Physical exam with 2 puncture sites both of which are oozing.  Quick clot placed, direct pressure held and a compressive Coban bandage was placed.  Laboratory valuation with a hemoglobin 11.1 which is near patient's baseline, glucose 67, creatinine 3.49, INR 1.2.  On reevaluation, we were able to successfully achieve hemostasis at one of the wounds but the other puncture site had persistent bleeding.  We were able to successfully lower his blood pressure to systolics in the 170s using his oral blood pressure medications that he forgot to take this morning.  Quick clot replaced and on reevaluation patient is still oozing.  I spoke with the vascular surgeon on-call Dr. Pearline who recommends doing a figure-of-eight stitch.  I placed 2 figure-of-eight stitches and was able to successfully achieve hemostasis.  Compressive bandage replaced.  Patient's blood sugar was slightly low but improved with oral glucose repletion.  On final reevaluation continues to have appropriate hemostasis and thus will be discharged with outpatient follow-up.  Sutures placed were absorbable sutures and he does not need to return to  have them removed.  Return precautions given which he  voiced understanding he was discharged.   Additional history obtained:  -External records from outside source obtained and reviewed including: Chart review including previous notes, labs, imaging, consultation notes   Lab Tests: -I ordered, reviewed, and interpreted labs.   The pertinent results include:   Labs Reviewed  COMPREHENSIVE METABOLIC PANEL WITH GFR - Abnormal; Notable for the following components:      Result Value   Chloride 97 (*)    Glucose, Bld 67 (*)    Creatinine, Ser 3.49 (*)    Calcium  8.6 (*)    GFR, Estimated 18 (*)    All other components within normal limits  CBC WITH DIFFERENTIAL/PLATELET - Abnormal; Notable for the following components:   RBC 3.63 (*)    Hemoglobin 11.1 (*)    HCT 34.0 (*)    All other components within normal limits  PROTIME-INR - Abnormal; Notable for the following components:   Prothrombin Time 15.5 (*)    All other components within normal limits  CBG MONITORING, ED       Medicines ordered and prescription drug management: Meds ordered this encounter  Medications   amLODipine  (NORVASC ) tablet 10 mg   hydrALAZINE  (APRESOLINE ) tablet 50 mg   losartan  (COZAAR ) tablet 100 mg   spironolactone  (ALDACTONE ) tablet 25 mg   lidocaine -EPINEPHrine  (XYLOCAINE  W/EPI) 2 %-1:200000 (PF) injection    Young, Tiffany M: cabinet override    -I have reviewed the patients home medicines and have made adjustments as needed  Critical interventions none  Consultations Obtained: I requested consultation with the vascular surgeon on-call Dr. Pearline,  and discussed lab and imaging findings as well as pertinent plan - they recommend: Figure-of-eight stitch   Cardiac Monitoring: The patient was maintained on a cardiac monitor.  I personally viewed and interpreted the cardiac monitored which showed an underlying rhythm of: NSR  Social Determinants of Health:  Factors impacting patients care include: none   Reevaluation: After the  interventions noted above, I reevaluated the patient and found that they have :improved  Co morbidities that complicate the patient evaluation  Past Medical History:  Diagnosis Date   CAD (coronary artery disease), native coronary artery    s/p NSTEMI with cath showing severe 3VD s/p  CABG x 3 with LIMA to LAD, SVG to OM1/OM2 and SVG D1 by Dr. Army on 10/26/16.   COPD (chronic obstructive pulmonary disease) (HCC) 09/10/2014   CVA (cerebral vascular accident) (HCC) 12/18/2021   Diabetes mellitus without complication (HCC)    Type II   ESRD (end stage renal disease) (HCC)    Hyperlipidemia LDL goal <70 04/17/2017   Hypertension       Dispostion: I considered admission for this patient, but at this time he does not meet inpatient criteria for admission will be discharged with outpatient follow-up     Final Clinical Impression(s) / ED Diagnoses Final diagnoses:  Complication of arteriovenous dialysis fistula, initial encounter     @PCDICTATION @    Albertina Dixon, MD 01/10/24 2236

## 2024-01-10 NOTE — ED Triage Notes (Signed)
 Per EMS, Pt, from dialysis, presents d/t bleeding L forearm fistula starting at 1600.  Denies pain.  Pt noted to be hypertensive.    Pt reports only taking part of his HTN medications.

## 2024-01-10 NOTE — ED Notes (Signed)
 Combat gauze and coban applied to left forearm.

## 2024-01-10 NOTE — ED Notes (Signed)
 Upon arrival, EDP ay bedside.  EDP placed quick clot, combat gauze, and coban on fistula.  Bleeding currently controlled.

## 2024-01-18 ENCOUNTER — Encounter: Payer: Self-pay | Admitting: Podiatry

## 2024-01-18 ENCOUNTER — Ambulatory Visit (INDEPENDENT_AMBULATORY_CARE_PROVIDER_SITE_OTHER): Admitting: Podiatry

## 2024-01-18 DIAGNOSIS — M79674 Pain in right toe(s): Secondary | ICD-10-CM

## 2024-01-18 DIAGNOSIS — B351 Tinea unguium: Secondary | ICD-10-CM | POA: Diagnosis not present

## 2024-01-18 DIAGNOSIS — M79675 Pain in left toe(s): Secondary | ICD-10-CM

## 2024-01-18 DIAGNOSIS — L84 Corns and callosities: Secondary | ICD-10-CM | POA: Diagnosis not present

## 2024-01-18 DIAGNOSIS — E1151 Type 2 diabetes mellitus with diabetic peripheral angiopathy without gangrene: Secondary | ICD-10-CM

## 2024-01-22 NOTE — Progress Notes (Signed)
 Subjective:   Patient ID: Shawn Collins, male   DOB: 73 y.o.   MRN: [id number]   HPI Chief Complaint  Patient presents with   Callouses    PAINFUL RIGHT 2ND TOE DISTAL CALLUS. 8 pain with pressure. Dialysis patient. A1C 5.2. Onychomycosis nail debridement.     73 year old male presents the office with above concerns.  He states he has a callus on the bottom of his foot which is growing out causing pain.  Nails are also thick elongated he has difficulty trimming them himself.  No open lesions otherwise.      Objective:  Physical Exam  General: AAO x3, NAD  Dermatological: Nails are hypertrophic, dystrophic, brittle, discolored, elongated 10. No surrounding redness or drainage. Tenderness nails 1-5 bilaterally.  Thick hyperkeratotic lesion distal aspect the second toe without any underlying ulceration, drainage or signs of infection.  No open lesions.  Vascular: Dorsalis Pedis artery and Posterior Tibial artery pedal pulses are 1/4 bilateral with immedate capillary fill time.  There is no pain with calf compression, swelling, warmth, erythema.   Neruologic: Grossly intact via light touch bilateral.  Musculoskeletal: Digital contractures present.  Tender the skin lesion on the second toe right foot.      Assessment:   Symptomatic onychomycosis, preulcerative callus     Plan:  -Treatment options discussed including all alternatives, risks, and complications -Etiology of symptoms were discussed -Nails debrided 10 without complications or bleeding. - Sharply debrided lesion x 1 without any complications or bleeding.  Discussed moisturizer, offloading.  Debridement I applied lidocaine  cream. -Currently no ulcerations or signs of complication.  Continue to monitor -Daily foot inspection -Follow-up in 3 months or sooner if any problems arise. In the meantime, encouraged to call the office with any questions, concerns, change in symptoms.   Donnice Fees, DPM

## 2024-02-09 ENCOUNTER — Emergency Department (HOSPITAL_COMMUNITY)

## 2024-02-09 ENCOUNTER — Emergency Department (HOSPITAL_COMMUNITY)
Admission: EM | Admit: 2024-02-09 | Discharge: 2024-02-10 | Disposition: A | Discharge status: Home / Self Care | Attending: Emergency Medicine | Admitting: Emergency Medicine

## 2024-02-09 ENCOUNTER — Encounter (HOSPITAL_COMMUNITY): Payer: Self-pay

## 2024-02-09 ENCOUNTER — Other Ambulatory Visit: Payer: Self-pay

## 2024-02-09 DIAGNOSIS — W07XXXA Fall from chair, initial encounter: Secondary | ICD-10-CM | POA: Insufficient documentation

## 2024-02-09 DIAGNOSIS — E1122 Type 2 diabetes mellitus with diabetic chronic kidney disease: Secondary | ICD-10-CM | POA: Diagnosis not present

## 2024-02-09 DIAGNOSIS — J449 Chronic obstructive pulmonary disease, unspecified: Secondary | ICD-10-CM | POA: Diagnosis not present

## 2024-02-09 DIAGNOSIS — I120 Hypertensive chronic kidney disease with stage 5 chronic kidney disease or end stage renal disease: Secondary | ICD-10-CM | POA: Insufficient documentation

## 2024-02-09 DIAGNOSIS — S2241XA Multiple fractures of ribs, right side, initial encounter for closed fracture: Secondary | ICD-10-CM | POA: Diagnosis not present

## 2024-02-09 DIAGNOSIS — I2510 Atherosclerotic heart disease of native coronary artery without angina pectoris: Secondary | ICD-10-CM | POA: Diagnosis not present

## 2024-02-09 DIAGNOSIS — Z7982 Long term (current) use of aspirin: Secondary | ICD-10-CM | POA: Diagnosis not present

## 2024-02-09 DIAGNOSIS — I10 Essential (primary) hypertension: Secondary | ICD-10-CM

## 2024-02-09 DIAGNOSIS — Z992 Dependence on renal dialysis: Secondary | ICD-10-CM | POA: Insufficient documentation

## 2024-02-09 DIAGNOSIS — N186 End stage renal disease: Secondary | ICD-10-CM | POA: Insufficient documentation

## 2024-02-09 DIAGNOSIS — S299XXA Unspecified injury of thorax, initial encounter: Secondary | ICD-10-CM | POA: Diagnosis present

## 2024-02-09 DIAGNOSIS — Z79899 Other long term (current) drug therapy: Secondary | ICD-10-CM | POA: Insufficient documentation

## 2024-02-09 DIAGNOSIS — Z951 Presence of aortocoronary bypass graft: Secondary | ICD-10-CM | POA: Diagnosis not present

## 2024-02-09 DIAGNOSIS — W19XXXA Unspecified fall, initial encounter: Secondary | ICD-10-CM

## 2024-02-09 MED ORDER — HYDRALAZINE HCL 25 MG PO TABS
50.0000 mg | ORAL_TABLET | Freq: Once | ORAL | Status: AC
  Administered 2024-02-09: 50 mg via ORAL
  Filled 2024-02-09: qty 2

## 2024-02-09 MED ORDER — AMLODIPINE BESYLATE 5 MG PO TABS
10.0000 mg | ORAL_TABLET | Freq: Once | ORAL | Status: AC
  Administered 2024-02-09: 10 mg via ORAL
  Filled 2024-02-09: qty 2

## 2024-02-09 NOTE — ED Provider Notes (Signed)
 [facility] Provider Note   CSN: [id number] Arrival date & time: 02/09/24  1427     History {Add pertinent medical, surgical, social history, OB history to HPI:1} Chief Complaint  Patient presents with   Shawn Collins is a 73 y.o. male with PMH as listed below who presents with right rib pain after mechanical fall from a chair.  No other injuries appreciated.. Pt fell today out of bus seat and landed on right side. C/O right sided flank pain. Pt received 2 hours of dialysis today.    Past Medical History:  Diagnosis Date   CAD (coronary artery disease), native coronary artery    s/p NSTEMI with cath showing severe 3VD s/p  CABG x 3 with LIMA to LAD, SVG to OM1/OM2 and SVG D1 by Dr. Army on 10/26/16.   COPD (chronic obstructive pulmonary disease) (HCC) 09/10/2014   CVA (cerebral vascular accident) (HCC) 12/18/2021   Diabetes mellitus without complication (HCC)    Type II   ESRD (end stage renal disease) (HCC)    Hyperlipidemia LDL goal <70 04/17/2017   Hypertension        Home Medications Prior to Admission medications   Medication Sig Start Date End Date Taking? Authorizing Provider  amLODipine  (NORVASC ) 10 MG tablet Take 1 tablet (10 mg total) by mouth at bedtime. 07/20/23   Rojelio Nest, DO  aspirin  81 MG chewable tablet Chew 1 tablet (81 mg total) by mouth daily. 07/20/23   Rojelio Nest, DO  atorvastatin  (LIPITOR ) 40 MG tablet Take 1 tablet (40 mg total) by mouth at bedtime. 07/20/23   Rojelio Nest, DO  hydrALAZINE  (APRESOLINE ) 50 MG tablet Take 1 tablet (50 mg total) by mouth every 8 (eight) hours. 07/20/23   Rojelio Nest, DO  losartan  (COZAAR ) 100 MG tablet Take 1 tablet (100 mg total) by mouth daily. 07/20/23   Rojelio Nest, DO  Methoxy PEG-Epoetin Beta (MIRCERA IJ) Mircera 08/09/23 08/07/24  [provider]  nitroGLYCERIN  (NITROSTAT ) 0.4 MG SL tablet Place 0.4 mg under the tongue every 5 (five)  minutes as needed for chest pain.    [provider]  sevelamer  carbonate (RENVELA ) 800 MG tablet Take 1,600 mg by mouth 3 (three) times daily with meals. Take 800mg  (1 tablet) with snacks 08/21/23   [provider]  spironolactone  (ALDACTONE ) 25 MG tablet Take 25 mg by mouth daily. 08/23/23   [provider]      Allergies    Patient has no known allergies.    Review of Systems   Review of Systems A 10 point review of systems was performed and is negative unless otherwise reported in HPI.  Physical Exam Updated Vital Signs BP (!) 193/76 (BP Location: Right Arm)   Pulse 65   Temp 98.3 F (36.8 C) (Oral)   Resp 15   Ht 5' 9 (1.753 m)   Wt 59 kg   SpO2 100%   BMI 19.20 kg/m  Physical Exam General: Normal appearing {Desc; male/male:11659}, lying in bed.  HEENT: PERRLA, Sclera anicteric, MMM, trachea midline.  Cardiology: RRR, no murmurs/rubs/gallops. BL radial and DP pulses equal bilaterally.  Resp: Normal respiratory rate and effort. CTAB, no wheezes, rhonchi, crackles.  Abd: Soft, non-tender, non-distended. No rebound tenderness or guarding.  GU: Deferred. MSK: No peripheral edema or signs of trauma. Extremities without deformity or TTP. No cyanosis or clubbing. Skin: warm, dry. No rashes or lesions. Back: No CVA tenderness Neuro: A&Ox4, CNs II-XII  grossly intact. MAEs. Sensation grossly intact.  Psych: Normal mood and affect.   ED Results / Procedures / Treatments   Labs (all labs ordered are listed, but only abnormal results are displayed) Labs Reviewed - No data to display  EKG None  Radiology DG Ribs Unilateral W/Chest Right Result Date: 02/09/2024 EXAM: 3 VIEW(S) XRAY OF THE UNILATERAL RIBS AND CHEST 02/09/2024 03:28:00 PM COMPARISON: None available. CLINICAL HISTORY: Fall. FINDINGS: BONES: No acute displaced rib fracture. LUNGS AND PLEURA: No consolidation or pulmonary edema. No pleural effusion or pneumothorax. HEART AND MEDIASTINUM: No  acute abnormality of the cardiac and mediastinal silhouettes. CABG markers noted. Sternotomy wires noted. IMPRESSION: 1. No acute rib fracture. Electronically signed by: Lonni Necessary MD 02/09/2024 03:37 PM EDT RP Workstation: [id number]    Procedures Procedures  {Document cardiac monitor, telemetry assessment procedure when appropriate:1}  Medications Ordered in ED Medications  amLODipine  (NORVASC ) tablet 10 mg (10 mg Oral Given 02/09/24 2008)    ED Course/ Medical Decision Making/ A&P                          Medical Decision Making   This patient presents to the ED for concern of ***, this involves an extensive number of treatment options, and is a complaint that carries with it a high risk of complications and morbidity.  I considered the following differential and admission for this acute, potentially life threatening condition.   MDM:    ***  Clinical Course as of 02/09/24 2347  Fri Feb 09, 2024  1958 Elevated BP, likely because he has not had any of his night time BP medications. Asymptomatic. Will order his amlodipine .  [RR]  2342 DG Ribs Unilateral W/Chest Right 1. No acute rib fracture. [HN]    Clinical Course User Index [HN] Franklyn Sid SAILOR, MD [RR] Bernis Ernst, PA-C    Labs: I Ordered, and personally interpreted labs.  The pertinent results include:  ***  Imaging Studies ordered: I ordered imaging studies including *** I independently visualized and interpreted imaging. I agree with the radiologist interpretation  Additional history obtained from ***.  External records from outside source obtained and reviewed including ***  Cardiac Monitoring: The patient was maintained on a cardiac monitor.  I personally viewed and interpreted the cardiac monitored which showed an underlying rhythm of: ***  Reevaluation: After the interventions noted above, I reevaluated the patient and found that they have :{resolved/improved/worsened:23923::improved}  Social  Determinants of Health: ***  Disposition:  ***  Co morbidities that complicate the patient evaluation  Past Medical History:  Diagnosis Date   CAD (coronary artery disease), native coronary artery    s/p NSTEMI with cath showing severe 3VD s/p  CABG x 3 with LIMA to LAD, SVG to OM1/OM2 and SVG D1 by Dr. Army on 10/26/16.   COPD (chronic obstructive pulmonary disease) (HCC) 09/10/2014   CVA (cerebral vascular accident) (HCC) 12/18/2021   Diabetes mellitus without complication (HCC)    Type II   ESRD (end stage renal disease) (HCC)    Hyperlipidemia LDL goal <70 04/17/2017   Hypertension      Medicines Meds ordered this encounter  Medications   amLODipine  (NORVASC ) tablet 10 mg    I have reviewed the patients home medicines and have made adjustments as needed  Problem List / ED Course: Problem List Items Addressed This Visit   None        {Document critical care time when appropriate:1} {Document review  of labs and clinical decision tools ie heart score, Chads2Vasc2 etc:1}  {Document your independent review of radiology images, and any outside records:1} {Document your discussion with family members, caretakers, and with consultants:1} {Document social determinants of health affecting pt's care:1} {Document your decision making why or why not admission, treatments were needed:1}  This note was created using dictation software, which may contain spelling or grammatical errors.

## 2024-02-09 NOTE — ED Notes (Signed)
 Pt blood pressure significantly elevated. Triage PA notified, pt brought to triage 6

## 2024-02-09 NOTE — ED Triage Notes (Signed)
 Pt fell today out of bus seat and landed on right side. C/O right sided flank pain. Pt received 2 hours of dialysis today. Axox4. Last BP 180 systolic.

## 2024-02-09 NOTE — ED Provider Triage Note (Signed)
 Emergency Medicine Provider Triage Evaluation Note  Shawn Collins , a 73 y.o. male  was evaluated in triage.  Pt complains of right rib pain after mechanical fall from a chair.  No other injuries appreciated..  Review of Systems  Positive: As above Negative: No shortness of breath  Physical Exam  BP (!) 163/69 (BP Location: Right Arm)   Pulse (!) 56   Temp 97.7 F (36.5 C)   Resp 17   Ht 1.753 m (5' 9)   Wt 59 kg   SpO2 100%   BMI 19.20 kg/m  Gen:   Awake, no distress   Resp:  Normal effort  MSK:   Moves extremities without difficulty tender to palpation at right costal margin.  No crepitus appreciated Other:    Medical Decision Making  Medically screening exam initiated at 2:49 PM.  Appropriate orders placed.  Shawn Collins was informed that the remainder of the evaluation will be completed by another provider, this initial triage assessment does not replace that evaluation, and the importance of remaining in the ED until their evaluation is complete.     Shawn Faden, MD 02/09/24 1450

## 2024-02-10 ENCOUNTER — Emergency Department (HOSPITAL_COMMUNITY)

## 2024-02-10 DIAGNOSIS — S2241XA Multiple fractures of ribs, right side, initial encounter for closed fracture: Secondary | ICD-10-CM | POA: Diagnosis not present

## 2024-02-10 LAB — COMPREHENSIVE METABOLIC PANEL WITH GFR
ALT: 18 U/L (ref 0–44)
AST: 29 U/L (ref 15–41)
Albumin: 3.7 g/dL (ref 3.5–5.0)
Alkaline Phosphatase: 52 U/L (ref 38–126)
Anion gap: 15 (ref 5–15)
BUN: 36 mg/dL — ABNORMAL HIGH (ref 8–23)
CO2: 22 mmol/L (ref 22–32)
Calcium: 8.9 mg/dL (ref 8.9–10.3)
Chloride: 93 mmol/L — ABNORMAL LOW (ref 98–111)
Creatinine, Ser: 6.59 mg/dL — ABNORMAL HIGH (ref 0.61–1.24)
GFR, Estimated: 8 mL/min — ABNORMAL LOW (ref >60)
Glucose, Bld: 89 mg/dL (ref 70–99)
Potassium: 5 mmol/L (ref 3.5–5.1)
Sodium: 130 mmol/L — ABNORMAL LOW (ref 135–145)
Total Bilirubin: 0.7 mg/dL (ref 0.0–1.2)
Total Protein: 7.6 g/dL (ref 6.5–8.1)

## 2024-02-10 LAB — CBC WITH DIFFERENTIAL/PLATELET
Abs Immature Granulocytes: 0.02 K/uL (ref 0.00–0.07)
Basophils Absolute: 0.1 K/uL (ref 0.0–0.1)
Basophils Relative: 1 %
Eosinophils Absolute: 0.1 K/uL (ref 0.0–0.5)
Eosinophils Relative: 2 %
HCT: 37.2 % — ABNORMAL LOW (ref 39.0–52.0)
Hemoglobin: 11.8 g/dL — ABNORMAL LOW (ref 13.0–17.0)
Immature Granulocytes: 0 %
Lymphocytes Relative: 12 %
Lymphs Abs: 0.7 K/uL (ref 0.7–4.0)
MCH: 30.6 pg (ref 26.0–34.0)
MCHC: 31.7 g/dL (ref 30.0–36.0)
MCV: 96.6 fL (ref 80.0–100.0)
Monocytes Absolute: 0.8 K/uL (ref 0.1–1.0)
Monocytes Relative: 14 %
Neutro Abs: 3.9 K/uL (ref 1.7–7.7)
Neutrophils Relative %: 71 %
Platelets: 261 K/uL (ref 150–400)
RBC: 3.85 MIL/uL — ABNORMAL LOW (ref 4.22–5.81)
RDW: 15 % (ref 11.5–15.5)
WBC: 5.5 K/uL (ref 4.0–10.5)
nRBC: 0 % (ref 0.0–0.2)

## 2024-02-10 MED ORDER — ACETAMINOPHEN 500 MG PO TABS
1000.0000 mg | ORAL_TABLET | Freq: Once | ORAL | Status: AC
  Administered 2024-02-10: 1000 mg via ORAL
  Filled 2024-02-10: qty 2

## 2024-02-10 MED ORDER — OXYCODONE HCL 5 MG PO TABS: 5.0000 mg | ORAL_TABLET | Freq: Four times a day (QID) | ORAL | 0 refills | Status: DC | PRN

## 2024-02-10 NOTE — Progress Notes (Signed)
 RT instructed pt on how to use an incentive spirometer, pt then demonstrated with great effort.

## 2024-02-10 NOTE — Discharge Instructions (Addendum)
 Thank you for coming to [facility] Emergency Department. You were seen for a fall on the bus.  You have 4 rib fractures on the right side.  Please take Tylenol  1,000 mg every 8 hours. You can also take oxycodone  5 mg every 6 hours as needed for severe breakthrough pain. Please do not drive while taking this medication as it can make you drowsy.  Please take MiraLAX  1-2 capfuls per day if you are taking the oxycodone  to help prevent constipation. Please use the incentive spirometer once per hour while you are awake to help prevent pneumonia.  Please call your dialysis in the morning to see if you can get the remainder of your treatment. Please follow up with your primary care provider within 1 week.   Do not hesitate to return to the ED or call 911 if you experience: -Worsening symptoms -Shortness of breath -Lightheadedness, passing out -Fevers/chills -Anything else that concerns you

## 2024-02-14 ENCOUNTER — Other Ambulatory Visit: Payer: Self-pay

## 2024-02-14 ENCOUNTER — Emergency Department (HOSPITAL_COMMUNITY)

## 2024-02-14 ENCOUNTER — Emergency Department (HOSPITAL_COMMUNITY)
Admission: EM | Admit: 2024-02-14 | Discharge: 2024-02-15 | Disposition: A | Discharge status: Home / Self Care | Attending: Emergency Medicine | Admitting: Emergency Medicine

## 2024-02-14 DIAGNOSIS — E875 Hyperkalemia: Secondary | ICD-10-CM

## 2024-02-14 DIAGNOSIS — R519 Headache, unspecified: Secondary | ICD-10-CM | POA: Insufficient documentation

## 2024-02-14 DIAGNOSIS — Z7982 Long term (current) use of aspirin: Secondary | ICD-10-CM | POA: Diagnosis not present

## 2024-02-14 DIAGNOSIS — Z992 Dependence on renal dialysis: Secondary | ICD-10-CM | POA: Insufficient documentation

## 2024-02-14 DIAGNOSIS — D631 Anemia in chronic kidney disease: Secondary | ICD-10-CM | POA: Insufficient documentation

## 2024-02-14 DIAGNOSIS — R0602 Shortness of breath: Secondary | ICD-10-CM | POA: Diagnosis present

## 2024-02-14 DIAGNOSIS — I120 Hypertensive chronic kidney disease with stage 5 chronic kidney disease or end stage renal disease: Secondary | ICD-10-CM | POA: Insufficient documentation

## 2024-02-14 DIAGNOSIS — J449 Chronic obstructive pulmonary disease, unspecified: Secondary | ICD-10-CM | POA: Diagnosis not present

## 2024-02-14 DIAGNOSIS — G988 Other disorders of nervous system: Secondary | ICD-10-CM

## 2024-02-14 DIAGNOSIS — E1122 Type 2 diabetes mellitus with diabetic chronic kidney disease: Secondary | ICD-10-CM | POA: Diagnosis not present

## 2024-02-14 DIAGNOSIS — N186 End stage renal disease: Secondary | ICD-10-CM | POA: Diagnosis not present

## 2024-02-14 DIAGNOSIS — Z7902 Long term (current) use of antithrombotics/antiplatelets: Secondary | ICD-10-CM | POA: Diagnosis not present

## 2024-02-14 DIAGNOSIS — I10 Essential (primary) hypertension: Secondary | ICD-10-CM

## 2024-02-14 LAB — CBC WITH DIFFERENTIAL/PLATELET
Abs Immature Granulocytes: 0.02 K/uL (ref 0.00–0.07)
Basophils Absolute: 0.1 K/uL (ref 0.0–0.1)
Basophils Relative: 1 %
Eosinophils Absolute: 0.1 K/uL (ref 0.0–0.5)
Eosinophils Relative: 1 %
HCT: 38 % — ABNORMAL LOW (ref 39.0–52.0)
Hemoglobin: 12.3 g/dL — ABNORMAL LOW (ref 13.0–17.0)
Immature Granulocytes: 0 %
Lymphocytes Relative: 10 %
Lymphs Abs: 0.6 K/uL — ABNORMAL LOW (ref 0.7–4.0)
MCH: 30.5 pg (ref 26.0–34.0)
MCHC: 32.4 g/dL (ref 30.0–36.0)
MCV: 94.3 fL (ref 80.0–100.0)
Monocytes Absolute: 0.5 K/uL (ref 0.1–1.0)
Monocytes Relative: 8 %
Neutro Abs: 4.4 K/uL (ref 1.7–7.7)
Neutrophils Relative %: 80 %
Platelets: 277 K/uL (ref 150–400)
RBC: 4.03 MIL/uL — ABNORMAL LOW (ref 4.22–5.81)
RDW: 15 % (ref 11.5–15.5)
WBC: 5.6 K/uL (ref 4.0–10.5)
nRBC: 0 % (ref 0.0–0.2)

## 2024-02-14 LAB — I-STAT CHEM 8, ED
BUN: 93 mg/dL — ABNORMAL HIGH (ref 8–23)
Calcium, Ion: 1.09 mmol/L — ABNORMAL LOW (ref 1.15–1.40)
Chloride: 100 mmol/L (ref 98–111)
Creatinine, Ser: 14.7 mg/dL — ABNORMAL HIGH (ref 0.61–1.24)
Glucose, Bld: 83 mg/dL (ref 70–99)
HCT: 42 % (ref 39.0–52.0)
Hemoglobin: 14.3 g/dL (ref 13.0–17.0)
Potassium: 6.4 mmol/L (ref 3.5–5.1)
Sodium: 130 mmol/L — ABNORMAL LOW (ref 135–145)
TCO2: 20 mmol/L — ABNORMAL LOW (ref 22–32)

## 2024-02-14 LAB — COMPREHENSIVE METABOLIC PANEL WITH GFR
ALT: 15 U/L (ref 0–44)
AST: 27 U/L (ref 15–41)
Albumin: 4 g/dL (ref 3.5–5.0)
Alkaline Phosphatase: 54 U/L (ref 38–126)
Anion gap: 20 — ABNORMAL HIGH (ref 5–15)
BUN: 97 mg/dL — ABNORMAL HIGH (ref 8–23)
CO2: 18 mmol/L — ABNORMAL LOW (ref 22–32)
Calcium: 9.3 mg/dL (ref 8.9–10.3)
Chloride: 95 mmol/L — ABNORMAL LOW (ref 98–111)
Creatinine, Ser: 13.57 mg/dL — ABNORMAL HIGH (ref 0.61–1.24)
GFR, Estimated: 3 mL/min — ABNORMAL LOW (ref >60)
Glucose, Bld: 86 mg/dL (ref 70–99)
Potassium: 6.4 mmol/L (ref 3.5–5.1)
Sodium: 133 mmol/L — ABNORMAL LOW (ref 135–145)
Total Bilirubin: 1 mg/dL (ref 0.0–1.2)
Total Protein: 7.9 g/dL (ref 6.5–8.1)

## 2024-02-14 LAB — HEPATITIS B SURFACE ANTIGEN: Hepatitis B Surface Ag: NONREACTIVE

## 2024-02-14 LAB — CBG MONITORING, ED
Glucose-Capillary: 84 mg/dL (ref 70–99)
Glucose-Capillary: 66 mg/dL — ABNORMAL LOW (ref 70–99)
Glucose-Capillary: 90 mg/dL (ref 70–99)
Glucose-Capillary: 90 mg/dL (ref 70–99)

## 2024-02-14 LAB — TROPONIN I (HIGH SENSITIVITY)
Troponin I (High Sensitivity): 101 ng/L (ref <18)
Troponin I (High Sensitivity): 96 ng/L — ABNORMAL HIGH (ref <18)

## 2024-02-14 MED ORDER — INSULIN ASPART 100 UNIT/ML IV SOLN
5.0000 [IU] | Freq: Once | INTRAVENOUS | Status: AC
  Administered 2024-02-14: 5 [IU] via INTRAVENOUS

## 2024-02-14 MED ORDER — SODIUM BICARBONATE 8.4 % IV SOLN
50.0000 meq | Freq: Once | INTRAVENOUS | Status: AC
  Administered 2024-02-14: 50 meq via INTRAVENOUS
  Filled 2024-02-14: qty 50

## 2024-02-14 MED ORDER — CHLORHEXIDINE GLUCONATE CLOTH 2 % EX PADS: 6.0000 | MEDICATED_PAD | Freq: Every day | CUTANEOUS | Status: DC

## 2024-02-14 MED ORDER — CALCIUM GLUCONATE 10 % IV SOLN
1.0000 g | Freq: Once | INTRAVENOUS | Status: AC
  Administered 2024-02-14: 1 g via INTRAVENOUS
  Filled 2024-02-14: qty 10

## 2024-02-14 MED ORDER — AMLODIPINE BESYLATE 5 MG PO TABS
5.0000 mg | ORAL_TABLET | Freq: Once | ORAL | Status: AC
  Administered 2024-02-14: 5 mg via ORAL
  Filled 2024-02-14: qty 1

## 2024-02-14 MED ORDER — CLONIDINE HCL 0.1 MG PO TABS
0.1000 mg | ORAL_TABLET | Freq: Once | ORAL | Status: AC
  Administered 2024-02-14: 0.1 mg via ORAL
  Filled 2024-02-14: qty 1

## 2024-02-14 MED ORDER — ALBUTEROL SULFATE (2.5 MG/3ML) 0.083% IN NEBU
10.0000 mg | INHALATION_SOLUTION | Freq: Once | RESPIRATORY_TRACT | Status: AC
  Administered 2024-02-14: 10 mg via RESPIRATORY_TRACT
  Filled 2024-02-14: qty 12

## 2024-02-14 MED ORDER — SODIUM ZIRCONIUM CYCLOSILICATE 10 G PO PACK
10.0000 g | PACK | Freq: Once | ORAL | Status: AC
  Administered 2024-02-14: 10 g via ORAL
  Filled 2024-02-14: qty 1

## 2024-02-14 MED ORDER — ONDANSETRON HCL 4 MG/2ML IJ SOLN
4.0000 mg | Freq: Once | INTRAMUSCULAR | Status: AC
  Administered 2024-02-14: 4 mg via INTRAVENOUS
  Filled 2024-02-14: qty 2

## 2024-02-14 MED ORDER — HYDRALAZINE HCL 25 MG PO TABS
50.0000 mg | ORAL_TABLET | Freq: Three times a day (TID) | ORAL | Status: DC
  Administered 2024-02-14: 50 mg via ORAL
  Filled 2024-02-14: qty 2

## 2024-02-14 MED ORDER — DEXTROSE 50 % IV SOLN
1.0000 | Freq: Once | INTRAVENOUS | Status: AC
  Administered 2024-02-14: 50 mL via INTRAVENOUS
  Filled 2024-02-14: qty 50

## 2024-02-14 MED ORDER — HYDRALAZINE HCL 20 MG/ML IJ SOLN
10.0000 mg | Freq: Once | INTRAMUSCULAR | Status: AC
  Administered 2024-02-14: 10 mg via INTRAVENOUS
  Filled 2024-02-14: qty 1

## 2024-02-14 MED ORDER — HYDRALAZINE HCL 20 MG/ML IJ SOLN
5.0000 mg | Freq: Once | INTRAMUSCULAR | Status: AC
  Administered 2024-02-14: 5 mg via INTRAVENOUS
  Filled 2024-02-14: qty 1

## 2024-02-14 NOTE — ED Triage Notes (Signed)
 Patient arrived with EMS from home , missed several hemodialysis treatments last week , hypertensive with mild SOB and intermittent confusion this morning .

## 2024-02-14 NOTE — Progress Notes (Signed)
 Pt receives out-pt HD at [facility], MWF, 1105 chair time. Will continue to assist as needed.   Shawn Collins Dialysis Navigator [phone]  Late note entry 9/18 9:17am Contacted [facility] to inform pt should be back Friday for normal schedule. No further assistance needed.

## 2024-02-14 NOTE — Discharge Instructions (Signed)
Return if symptoms worsen. Follow up with your primary care doctor.

## 2024-02-14 NOTE — Procedures (Signed)
 Asked to see this patient for hospital dialysis. Pt presented to ED c/o missed HD, high BP's w/ mild SOB and mild confusion this am. On exam his is on RA, no resp issues, CXR clear, minimal LE edema. K+ 6.4, creat 14. The plan will be for ED HD. Pt is not to be admitted at this time. Pt will go to the dialysis unit when they are ready for the patient. When dialysis is completed pt will be sent back to ED for reassessment.   Vitals:   02/14/24 0909 02/14/24 0930 02/14/24 1045 02/14/24 1100  BP:  (!) 191/128 (!) 187/108 (!) 188/98  Pulse: 91 90 93 87  Resp: 15 16 20 14   Temp:      TempSrc:      SpO2: 100% 99% 97% 100%   OP HD: [facility] MWF 3h  B450   55.5kg  2K bath  AVG  Heparin  none    I was present at the procedure, reviewed the HD regimen and made appropriate changes.   Myer Fret MD  [facility] 02/14/2024, 11:05 AM    Recent Labs  Lab 02/10/24 0202 02/14/24 0625 02/14/24 0651  HGB 11.8* 12.3* 14.3  ALBUMIN  3.7 4.0  --   CALCIUM  8.9 9.3  --   CREATININE 6.59* 13.57* 14.70*  K 5.0 6.4* 6.4*   No results for input(s): IRON, TIBC, FERRITIN in the last 168 hours. Inpatient medications:

## 2024-02-14 NOTE — ED Notes (Signed)
 CBG 66, patient given OJ

## 2024-02-14 NOTE — ED Provider Notes (Signed)
 Patient returned from dialysis.  He has been having nausea and vomiting here recently to finish dialysis.  Blood pressure still elevated 240/90.  Denies any headache or abdominal pain.  Feels like he needs something to eat.  Will give him his home dose of p.o. hydralazine  and give him a dose of IV hydralazine  and get a head CT.  Will recheck blood pressure.  Talked with nephrology he is cleared from their standpoint.  His potassium was 6.4 prior to dialysis but now corrected.  Repeat blood sugar here is 90.  Will reevaluate after Zofran  and IV blood pressure meds.  He does not have any chest pain.  Overall head CT is unremarkable.  Blood pressures improved to 180 systolic which seems to be around his baseline.  Multiple visits shows blood pressure greater than 200 systolic.  Ultimately has been able to eat and drink without any issues.  Denies any abdominal pain.  Overall patient was able to eat and drink without any issues.  He is a little bit concerned about trying to get home as no one can pick him up.  He stable for discharge at this time.  Will try to give him a voucher/ride home.  This chart was dictated using voice recognition software.  Despite best efforts to proofread,  errors can occur which can change the documentation meaning.    Ruthe Cornet, DO 02/14/24 2158

## 2024-02-14 NOTE — ED Notes (Signed)
 CCMD called by this RN

## 2024-02-14 NOTE — ED Notes (Signed)
 Phlebotomy to collect troponin due.

## 2024-02-14 NOTE — Progress Notes (Addendum)
 The patient completed dialysis treatment and goal met. UF off 3.5 L.  Rechecked BP 134/89 mmHg. HR 86. R 16. During the dialysis procedure, the patient's BP continued keep high. The patient's dialysis access site AVG prolong bleeding stopped needles out after 30 mins.  02/14/24 1716  Vitals  Temp 98.8 F (37.1 C)  BP (!) 210/84  Pulse Rate 87  Resp 16  Oxygen Therapy  SpO2 99 %  Patient Activity (if Appropriate) In bed  During Treatment Monitoring  Intra-Hemodialysis Comments Tx completed  Post Treatment  Dialyzer Clearance Clear  Hemodialysis Intake (mL) 0 mL  Liters Processed 58.6  Fluid Removed (mL) 3500 mL  Tolerated HD Treatment Yes  AVG/AVF Arterial Site Held (minutes) 10 minutes  AVG/AVF Venous Site Held (minutes) 10 minutes

## 2024-02-14 NOTE — ED Provider Notes (Signed)
 [place] EMERGENCY DEPARTMENT AT [facility] Provider Note   CSN: [id number] Arrival date & time: 02/14/24  9387     Patient presents with: Missed Hemodialysis / Hypertension    Shawn Collins is a 73 y.o. male with history of COPD, diabetes, ESRD on dialysis presents to the emerged from today for evaluation of missed dialysis sessions.  He reports that his last full session was a week ago.  He had a partial session on Friday however had to leave due to his rib pain.  He reports that he was unable to go for the rest of the time and took EMS here due to his missed sessions.  EMS note/nursing mentions that he was planing of shortness breath and confusion however the patient is alert and oriented x 4 and is not complaining of any chest pain or shortness of breath.  His blood pressure is elevated here however he has not had any of his medications.  HPI     Prior to Admission medications   Medication Sig Start Date End Date Taking? Authorizing Provider  amLODipine  (NORVASC ) 10 MG tablet Take 1 tablet (10 mg total) by mouth at bedtime. 07/20/23  Yes Rojelio Nest, DO  aspirin  81 MG chewable tablet Chew 1 tablet (81 mg total) by mouth daily. 07/20/23  Yes Rojelio Nest, DO  atorvastatin  (LIPITOR ) 40 MG tablet Take 1 tablet (40 mg total) by mouth at bedtime. 07/20/23  Yes Rojelio Nest, DO  clopidogrel  (PLAVIX ) 75 MG tablet Take 75 mg by mouth daily. 01/14/24  Yes [provider]  hydrALAZINE  (APRESOLINE ) 50 MG tablet Take 1 tablet (50 mg total) by mouth every 8 (eight) hours. 07/20/23  Yes Rojelio Nest, DO  losartan  (COZAAR ) 100 MG tablet Take 1 tablet (100 mg total) by mouth daily. 07/20/23  Yes Rojelio Nest, DO  nitroGLYCERIN  (NITROSTAT ) 0.4 MG SL tablet Place 0.4 mg under the tongue every 5 (five) minutes as needed for chest pain.   Yes [provider]  oxyCODONE  (ROXICODONE ) 5 MG immediate release tablet Take 1 tablet (5 mg total) by mouth every 6 (six) hours as  needed for up to 18 doses for severe pain (pain score 7-10). 02/10/24  Yes Franklyn Sid SAILOR, MD  sevelamer  carbonate (RENVELA ) 800 MG tablet Take 1,600 mg by mouth 3 (three) times daily with meals. Take 800mg  (1 tablet) with snacks 08/21/23  Yes [provider]  spironolactone  (ALDACTONE ) 25 MG tablet Take 25 mg by mouth daily. 08/23/23  Yes [provider]    Allergies: Patient has no known allergies.    Review of Systems  Constitutional:  Negative for chills and fever.  Respiratory:  Negative for shortness of breath.   Cardiovascular:  Negative for chest pain.    Updated Vital Signs BP (!) 225/140   Pulse 91   Temp (!) 97.4 F (36.3 C) (Oral)   Resp 15   SpO2 100%   Physical Exam Vitals and nursing note reviewed.  Constitutional:      Comments: Chronically ill-appearing   Eyes:     General: No scleral icterus. Cardiovascular:     Rate and Rhythm: Normal rate.  Pulmonary:     Effort: Pulmonary effort is normal. No respiratory distress.     Comments: Rales/rhonchi in the bilateral lower bases.  Speaks in full sentences.  No acute respiratory distress. Skin:    General: Skin is warm and dry.  Neurological:     Mental Status: He is alert and oriented to person,  place, and time.     (all labs ordered are listed, but only abnormal results are displayed) Labs Reviewed  CBC WITH DIFFERENTIAL/PLATELET - Abnormal; Notable for the following components:      Result Value   RBC 4.03 (*)    Hemoglobin 12.3 (*)    HCT 38.0 (*)    Lymphs Abs 0.6 (*)    All other components within normal limits  COMPREHENSIVE METABOLIC PANEL WITH GFR - Abnormal; Notable for the following components:   Sodium 133 (*)    Potassium 6.4 (*)    Chloride 95 (*)    CO2 18 (*)    BUN 97 (*)    Creatinine, Ser 13.57 (*)    GFR, Estimated 3 (*)    Anion gap 20 (*)    All other components within normal limits  I-STAT CHEM 8, ED - Abnormal; Notable for the following components:   Sodium  130 (*)    Potassium 6.4 (*)    BUN 93 (*)    Creatinine, Ser 14.70 (*)    Calcium , Ion 1.09 (*)    TCO2 20 (*)    All other components within normal limits  TROPONIN I (HIGH SENSITIVITY) - Abnormal; Notable for the following components:   Troponin I (High Sensitivity) 101 (*)    All other components within normal limits  CBG MONITORING, ED  TROPONIN I (HIGH SENSITIVITY)    EKG: EKG Interpretation Date/Time:  Wednesday February 14 2024 06:28:17 EDT Ventricular Rate:  78 PR Interval:  164 QRS Duration:  84 QT Interval:  372 QTC Calculation: 424 R Axis:   -38  Text Interpretation: Normal sinus rhythm Left axis deviation Minimal voltage criteria for LVH, may be normal variant ( Cornell product ) Nonspecific ST abnormality Abnormal ECG When compared with ECG of 10-Feb-2024 00:44, No significant change was found Confirmed by Raford Lenis ([id number]) on 02/14/2024 6:34:22 AM  Radiology: [facility] Chest Portable 1 View Result Date: 02/14/2024 CLINICAL DATA:  SOB EXAM: PORTABLE CHEST - 1 VIEW COMPARISON:  02/09/2024 FINDINGS: No focal airspace consolidation, pleural effusion, or pneumothorax. No cardiomegaly. Aortic atherosclerosis. Sternotomy wires and CABG markers. The right rib fractures on the prior CT are not well visualized by plain radiography. Multilevel thoracic osteophytosis. IMPRESSION: The right rib fractures on the prior CT are not well visualized by plain radiography. Otherwise, no acute cardiopulmonary abnormality. Electronically Signed   By: Rogelia Myers M.D.   On: 02/14/2024 08:13   Procedures   Medications Ordered in the ED  sodium zirconium cyclosilicate  (LOKELMA ) packet 10 g (10 g Oral Given 02/14/24 0718)  albuterol  (PROVENTIL ) (2.5 MG/3ML) 0.083% nebulizer solution 10 mg (10 mg Nebulization Given 02/14/24 0836)  insulin  aspart (novoLOG ) injection 5 Units (5 Units Intravenous Given 02/14/24 0717)    And  dextrose  50 % solution 50 mL (50 mLs Intravenous Given 02/14/24 0718)   calcium  gluconate inj 10% (1 g) URGENT USE ONLY! (1 g Intravenous Given 02/14/24 0717)  sodium bicarbonate  injection 50 mEq (50 mEq Intravenous Given 02/14/24 0718)  amLODipine  (NORVASC ) tablet 5 mg (5 mg Oral Given 02/14/24 0718)  hydrALAZINE  (APRESOLINE ) injection 5 mg (5 mg Intravenous Given 02/14/24 0725)    Clinical Course as of 02/14/24 0947  Wed Feb 14, 2024  0812 Spoke with Dr. Geralynn with nephrology who will come down to eval the patient and arrange ED dialysis for now.  [RR]  9177 Potassium 6.4, Troponin 101.  [RR]    Clinical Course User Index [RR] Bernis Ernst, PA-C  Medical Decision Making Amount and/or Complexity of Data Reviewed Labs: ordered. Radiology: ordered.  Risk OTC drugs. Prescription drug management.   73 y.o. male presents to the ER for evaluation of missed dialysis. Differential diagnosis includes but is not limited to volume overload, hyperkalemia, pulmonary edema, pleural effusions. Vital signs significantly hypertensive at 269/119.  Temperature at 97.4, satting well on room air without increased work of breathing. Physical exam as noted above.   I-STAT labs and other labs were ordered.  EKG ordered as well.  Chest x-ray ordered.  I independently reviewed and interpreted the patient's labs.  CBC shows mild anemia with hemoglobin of 12.3 hematocrit 38.  No leukocytosis.  Anemia at baseline.  I-STAT Chem-8 shows a potassium 6.4, sodium 130, BUN at 93 with a creatinine of 14.7.  Calcium  ionized at 1.09 and bicarb of 20.  Troponin at 101 with repeat at 96.  Given the elevated potassium, discussed this with my attending who recommended ordering calcium  gluconate urgently however he was unsure if the T waves were acute or not.  Also advised to give insulin , glucose, sodium bicarb, Lokelma .  Consulted nephrology who will take him for ED dialysis.  Spoke with Dr. Geralynn with nephrology.   I have ordered the patient some IV hydralizine and PO amlodipine  for his  blood pressures.   EKG reviewed and interpreted by my attending and read as Normal sinus rhythm Left axis deviation Minimal voltage criteria for LVH, may be normal variant ( Cornell product ) Nonspecific ST abnormality Abnormal ECG When compared with ECG of 10-Feb-2024 00:44, No significant change was found   CXR shows The right rib fractures on the prior CT are not well visualized by plain radiography. Otherwise, no acute cardiopulmonary abnormality. Per radiologist's interpretation.    Patient does not have any chest pain or shortness of breath.  Think of the troponins are likely from hypertensive crisis. After discussing with my attending, if the patient is still not having any chest pain or SOB and his BP has improved after dialysis, he can be discharged home.     I discussed this case with my attending physician who cosigned this note including patient's presenting symptoms, physical exam, and planned diagnostics and interventions. Attending physician stated agreement with plan or made changes to plan which were implemented.   Portions of this report may have been transcribed using voice recognition software. Every effort was made to ensure accuracy; however, inadvertent computerized transcription errors may be present.    Final diagnoses:  None    ED Discharge Orders     None          Bernis Ernst, [place] 02/14/24 1458    Raford Lenis, MD 02/15/24 [phone]

## 2024-02-15 DIAGNOSIS — R0602 Shortness of breath: Secondary | ICD-10-CM | POA: Diagnosis not present

## 2024-02-15 LAB — HEPATITIS B SURFACE ANTIBODY, QUANTITATIVE: Hep B S AB Quant (Post): 37.5 m[IU]/mL

## 2024-03-08 ENCOUNTER — Other Ambulatory Visit: Payer: Self-pay

## 2024-03-08 ENCOUNTER — Emergency Department (HOSPITAL_COMMUNITY)
Admission: EM | Admit: 2024-03-08 | Discharge: 2024-03-08 | Disposition: A | Discharge status: Home / Self Care | Attending: Emergency Medicine | Admitting: Emergency Medicine

## 2024-03-08 DIAGNOSIS — I2510 Atherosclerotic heart disease of native coronary artery without angina pectoris: Secondary | ICD-10-CM | POA: Diagnosis not present

## 2024-03-08 DIAGNOSIS — J449 Chronic obstructive pulmonary disease, unspecified: Secondary | ICD-10-CM | POA: Diagnosis not present

## 2024-03-08 DIAGNOSIS — E875 Hyperkalemia: Secondary | ICD-10-CM | POA: Insufficient documentation

## 2024-03-08 DIAGNOSIS — N186 End stage renal disease: Secondary | ICD-10-CM | POA: Insufficient documentation

## 2024-03-08 DIAGNOSIS — I10 Essential (primary) hypertension: Secondary | ICD-10-CM

## 2024-03-08 DIAGNOSIS — Z992 Dependence on renal dialysis: Secondary | ICD-10-CM | POA: Insufficient documentation

## 2024-03-08 DIAGNOSIS — Z7982 Long term (current) use of aspirin: Secondary | ICD-10-CM | POA: Diagnosis not present

## 2024-03-08 DIAGNOSIS — I120 Hypertensive chronic kidney disease with stage 5 chronic kidney disease or end stage renal disease: Secondary | ICD-10-CM | POA: Insufficient documentation

## 2024-03-08 DIAGNOSIS — Z79899 Other long term (current) drug therapy: Secondary | ICD-10-CM | POA: Diagnosis not present

## 2024-03-08 DIAGNOSIS — E1122 Type 2 diabetes mellitus with diabetic chronic kidney disease: Secondary | ICD-10-CM | POA: Insufficient documentation

## 2024-03-08 DIAGNOSIS — D631 Anemia in chronic kidney disease: Secondary | ICD-10-CM | POA: Diagnosis not present

## 2024-03-08 LAB — BASIC METABOLIC PANEL WITH GFR
Anion gap: 18 — ABNORMAL HIGH (ref 5–15)
BUN: 54 mg/dL — ABNORMAL HIGH (ref 8–23)
CO2: 25 mmol/L (ref 22–32)
Calcium: 8.8 mg/dL — ABNORMAL LOW (ref 8.9–10.3)
Chloride: 94 mmol/L — ABNORMAL LOW (ref 98–111)
Creatinine, Ser: 8.69 mg/dL — ABNORMAL HIGH (ref 0.61–1.24)
GFR, Estimated: 6 mL/min — ABNORMAL LOW (ref >60)
Glucose, Bld: 75 mg/dL (ref 70–99)
Potassium: 5.5 mmol/L — ABNORMAL HIGH (ref 3.5–5.1)
Sodium: 137 mmol/L (ref 135–145)

## 2024-03-08 LAB — CBC WITH DIFFERENTIAL/PLATELET
Abs Immature Granulocytes: 0.02 K/uL (ref 0.00–0.07)
Basophils Absolute: 0.1 K/uL (ref 0.0–0.1)
Basophils Relative: 1 %
Eosinophils Absolute: 0.1 K/uL (ref 0.0–0.5)
Eosinophils Relative: 2 %
HCT: 37.4 % — ABNORMAL LOW (ref 39.0–52.0)
Hemoglobin: 11.8 g/dL — ABNORMAL LOW (ref 13.0–17.0)
Immature Granulocytes: 0 %
Lymphocytes Relative: 16 %
Lymphs Abs: 0.9 K/uL (ref 0.7–4.0)
MCH: 30.7 pg (ref 26.0–34.0)
MCHC: 31.6 g/dL (ref 30.0–36.0)
MCV: 97.4 fL (ref 80.0–100.0)
Monocytes Absolute: 0.6 K/uL (ref 0.1–1.0)
Monocytes Relative: 11 %
Neutro Abs: 3.6 K/uL (ref 1.7–7.7)
Neutrophils Relative %: 70 %
Platelets: 240 K/uL (ref 150–400)
RBC: 3.84 MIL/uL — ABNORMAL LOW (ref 4.22–5.81)
RDW: 15.4 % (ref 11.5–15.5)
WBC: 5.3 K/uL (ref 4.0–10.5)
nRBC: 0 % (ref 0.0–0.2)

## 2024-03-08 MED ORDER — SODIUM ZIRCONIUM CYCLOSILICATE 10 G PO PACK: 10.0000 g | PACK | Freq: Once | ORAL | Status: DC

## 2024-03-08 MED ORDER — HYDRALAZINE HCL 20 MG/ML IJ SOLN
25.0000 mg | Freq: Once | INTRAMUSCULAR | Status: AC
  Administered 2024-03-08: 25 mg via INTRAVENOUS
  Filled 2024-03-08: qty 2

## 2024-03-08 NOTE — ED Triage Notes (Signed)
 Pt bibgcems from home c/o high bp with personal bp cuff 240/120. No other symptoms noted. Doesn't remember last time he took bp meds. Ems bp noted to be 230/100. Dialysis pt MWF pt went to last dialysis session but did not complete full dialysis.   Hr 60 98%

## 2024-03-08 NOTE — ED Provider Notes (Signed)
 [place] EMERGENCY DEPARTMENT AT [facility] Provider Note   CSN: [id number] Arrival date & time: 03/08/24  0401     Patient presents with: Hypertension   Zekiel Torian is a 73 y.o. male.   The history is provided by the patient.  Hypertension   He has history of hypertension, diabetes, hyperlipidemia, coronary artery disease, stroke, COPD, end-stage renal disease on hemodialysis Monday-Wednesday-Friday and comes in with just generally not feeling well.  He states he has been having difficulty sleeping.  Denies any headache or tinnitus or epistaxes.  He denies chest pain, heaviness, tightness, pressure.  He checked his blood pressure at home and was 240/120.  He states he was unable to get his blood pressure at home.  EMS noted blood pressure 230/100.  Triage note states that he does not remember when the last time was he took his blood pressure medication.  However, of the medication he has not been taking is atorvastatin .  His blood pressure medication is amlodipine  and he has been taking that nightly and he denies missing any doses of that.  He states that he had his full dialysis 2 days ago.  He is scheduled for dialysis at 11 AM today.    Prior to Admission medications   Medication Sig Start Date End Date Taking? Authorizing Provider  amLODipine  (NORVASC ) 10 MG tablet Take 1 tablet (10 mg total) by mouth at bedtime. 07/20/23   Rojelio Nest, DO  aspirin  81 MG chewable tablet Chew 1 tablet (81 mg total) by mouth daily. 07/20/23   Rojelio Nest, DO  atorvastatin  (LIPITOR ) 40 MG tablet Take 1 tablet (40 mg total) by mouth at bedtime. 07/20/23   Rojelio Nest, DO  clopidogrel  (PLAVIX ) 75 MG tablet Take 75 mg by mouth daily. 01/14/24   [provider]  hydrALAZINE  (APRESOLINE ) 50 MG tablet Take 1 tablet (50 mg total) by mouth every 8 (eight) hours. 07/20/23   Rojelio Nest, DO  losartan  (COZAAR ) 100 MG tablet Take 1 tablet (100 mg total) by mouth daily. 07/20/23    Rojelio Nest, DO  nitroGLYCERIN  (NITROSTAT ) 0.4 MG SL tablet Place 0.4 mg under the tongue every 5 (five) minutes as needed for chest pain.    [provider]  oxyCODONE  (ROXICODONE ) 5 MG immediate release tablet Take 1 tablet (5 mg total) by mouth every 6 (six) hours as needed for up to 18 doses for severe pain (pain score 7-10). 02/10/24   Franklyn Sid SAILOR, MD  sevelamer  carbonate (RENVELA ) 800 MG tablet Take 1,600 mg by mouth 3 (three) times daily with meals. Take 800mg  (1 tablet) with snacks 08/21/23   [provider]  spironolactone  (ALDACTONE ) 25 MG tablet Take 25 mg by mouth daily. 08/23/23   [provider]    Allergies: Patient has no known allergies.    Review of Systems  All other systems reviewed and are negative.   Updated Vital Signs BP (!) 231/92 (BP Location: Right Arm)   Pulse 63   Temp 97.9 F (36.6 C)   Resp 20   Ht 5' 10 (1.778 m)   Wt 61.8 kg   SpO2 100%   BMI 19.55 kg/m   Physical Exam Vitals and nursing note reviewed.   73 year old male, resting comfortably and in no acute distress. Vital signs are significant for markedly elevated blood pressure. Oxygen saturation is 100%, which is normal. Head is normocephalic and atraumatic. PERRLA, EOMI.  Pupils are constricted, unable to do funduscopic exam. Neck is nontender  and supple. Lungs are clear without rales, wheezes, or rhonchi. Chest is nontender. Heart has regular rate and rhythm without murmur. Abdomen is soft, flat, nontender. Extremities have no cyanosis or edema, full range of motion is present.  AV fistula is present on the left forearm with thrill present. Skin is warm and dry without rash. Neurologic: Mental status is normal, cranial nerves are intact, moves all extremities equally.  (all labs ordered are listed, but only abnormal results are displayed) Labs Reviewed  BASIC METABOLIC PANEL WITH GFR - Abnormal; Notable for the following components:      Result Value    Potassium 5.5 (*)    Chloride 94 (*)    BUN 54 (*)    Creatinine, Ser 8.69 (*)    Calcium  8.8 (*)    GFR, Estimated 6 (*)    Anion gap 18 (*)    All other components within normal limits  CBC WITH DIFFERENTIAL/PLATELET - Abnormal; Notable for the following components:   RBC 3.84 (*)    Hemoglobin 11.8 (*)    HCT 37.4 (*)    All other components within normal limits    EKG: EKG Interpretation Date/Time:  Friday March 08 2024 04:50:14 EDT Ventricular Rate:  67 PR Interval:  170 QRS Duration:  92 QT Interval:  422 QTC Calculation: 446 R Axis:   38  Text Interpretation: Sinus rhythm ST elevation, consider anterior injury When compared with ECG of 02/14/2024, T wave amplitude has increased in Anterior leads Confirmed by Raford Lenis ([id number]) on 03/08/2024 5:11:12 AM    Procedures  Cardiac monitor shows normal sinus rhythm, per my interpretation. Medications Ordered in the ED  hydrALAZINE  (APRESOLINE ) injection 25 mg (25 mg Intravenous Given 03/08/24 0451)                                    Medical Decision Making Amount and/or Complexity of Data Reviewed Labs: ordered.  Risk Prescription drug management.   Severe hypertension without evidence of endorgan damage.  I have ordered a dose of hydralazine .  I have reviewed his past records, and he was seen in the ED on 02/14/2024 needing emergent dialysis for hyperkalemia and severely elevated blood pressure.  Following hydralazine , blood pressure has come down to a reasonable level with last blood pressure reading 182/79.  I have reviewed his laboratory tests, my interpretation is stable anemia which is presumably secondary to end-stage renal disease, mild hyperkalemia, elevated BUN and creatinine consistent with known end-stage renal disease.  In the absence of I have reviewed his electrocardiogram, and my interpretation is sinus rhythm with anterior J-point elevation which is unchanged from prior.  Prior tall peaked T waves  have normalized.  In the absence of ECG changes, and with's schedule dialysis in a few hours, I feel he is safe for discharge.  I have ordered a dose of sodium zirconium cyclosilicate  8.  I have also instructed him to work with his primary care provider and with his nephrologist to adjust his antihypertensive regimen to get better blood pressure control.  CRITICAL CARE Performed by: Lenis Raford Total critical care time: 45 minutes Critical care time was exclusive of separately billable procedures and treating other patients. Critical care was necessary to treat or prevent imminent or life-threatening deterioration. Critical care was time spent personally by me on the following activities: development of treatment plan with patient and/or surrogate as well as nursing, discussions with  consultants, evaluation of patient's response to treatment, examination of patient, obtaining history from patient or surrogate, ordering and performing treatments and interventions, ordering and review of laboratory studies, ordering and review of radiographic studies, pulse oximetry and re-evaluation of patient's condition.      Final diagnoses:  Poorly-controlled hypertension  Hyperkalemia  End-stage renal disease on hemodialysis (HCC)  Anemia associated with chronic renal failure    ED Discharge Orders     None          Raford Lenis, MD 03/08/24 [phone]

## 2024-03-08 NOTE — Discharge Instructions (Addendum)
 Make sure to go for your scheduled dialysis today.  You need to work with your primary care provider and with your nephrologist to adjust your high blood pressure medication to get it under better control.  If you are concerned about how the atorvastatin  is affecting your sleep, consider taking it in the morning instead of at night.

## 2024-03-18 ENCOUNTER — Encounter (HOSPITAL_COMMUNITY): Payer: Self-pay

## 2024-03-18 ENCOUNTER — Other Ambulatory Visit: Payer: Self-pay

## 2024-03-18 ENCOUNTER — Emergency Department (HOSPITAL_COMMUNITY)

## 2024-03-18 ENCOUNTER — Emergency Department (HOSPITAL_COMMUNITY)
Admission: EM | Admit: 2024-03-18 | Discharge: 2024-03-18 | Disposition: A | Discharge status: Home / Self Care | Attending: Emergency Medicine | Admitting: Emergency Medicine

## 2024-03-18 DIAGNOSIS — I160 Hypertensive urgency: Secondary | ICD-10-CM | POA: Diagnosis not present

## 2024-03-18 DIAGNOSIS — I120 Hypertensive chronic kidney disease with stage 5 chronic kidney disease or end stage renal disease: Secondary | ICD-10-CM | POA: Insufficient documentation

## 2024-03-18 DIAGNOSIS — N186 End stage renal disease: Secondary | ICD-10-CM | POA: Diagnosis not present

## 2024-03-18 DIAGNOSIS — Z992 Dependence on renal dialysis: Secondary | ICD-10-CM | POA: Diagnosis not present

## 2024-03-18 LAB — CBC WITH DIFFERENTIAL/PLATELET
Abs Immature Granulocytes: 0.01 K/uL (ref 0.00–0.07)
Basophils Absolute: 0.1 K/uL (ref 0.0–0.1)
Basophils Relative: 1 %
Eosinophils Absolute: 0.1 K/uL (ref 0.0–0.5)
Eosinophils Relative: 2 %
HCT: 33.4 % — ABNORMAL LOW (ref 39.0–52.0)
Hemoglobin: 10.7 g/dL — ABNORMAL LOW (ref 13.0–17.0)
Immature Granulocytes: 0 %
Lymphocytes Relative: 22 %
Lymphs Abs: 0.9 K/uL (ref 0.7–4.0)
MCH: 30.7 pg (ref 26.0–34.0)
MCHC: 32 g/dL (ref 30.0–36.0)
MCV: 96 fL (ref 80.0–100.0)
Monocytes Absolute: 0.6 K/uL (ref 0.1–1.0)
Monocytes Relative: 14 %
Neutro Abs: 2.4 K/uL (ref 1.7–7.7)
Neutrophils Relative %: 61 %
Platelets: 210 K/uL (ref 150–400)
RBC: 3.48 MIL/uL — ABNORMAL LOW (ref 4.22–5.81)
RDW: 14.5 % (ref 11.5–15.5)
WBC: 4 K/uL (ref 4.0–10.5)
nRBC: 0 % (ref 0.0–0.2)

## 2024-03-18 LAB — COMPREHENSIVE METABOLIC PANEL WITH GFR
ALT: 17 U/L (ref 0–44)
AST: 29 U/L (ref 15–41)
Albumin: 3.5 g/dL (ref 3.5–5.0)
Alkaline Phosphatase: 56 U/L (ref 38–126)
Anion gap: 16 — ABNORMAL HIGH (ref 5–15)
BUN: 77 mg/dL — ABNORMAL HIGH (ref 8–23)
CO2: 21 mmol/L — ABNORMAL LOW (ref 22–32)
Calcium: 8.8 mg/dL — ABNORMAL LOW (ref 8.9–10.3)
Chloride: 99 mmol/L (ref 98–111)
Creatinine, Ser: 11.31 mg/dL — ABNORMAL HIGH (ref 0.61–1.24)
GFR, Estimated: 4 mL/min — ABNORMAL LOW (ref >60)
Glucose, Bld: 79 mg/dL (ref 70–99)
Potassium: 5.2 mmol/L — ABNORMAL HIGH (ref 3.5–5.1)
Sodium: 136 mmol/L (ref 135–145)
Total Bilirubin: 0.6 mg/dL (ref 0.0–1.2)
Total Protein: 6.8 g/dL (ref 6.5–8.1)

## 2024-03-18 LAB — TROPONIN I (HIGH SENSITIVITY)
Troponin I (High Sensitivity): 52 ng/L — ABNORMAL HIGH (ref <18)
Troponin I (High Sensitivity): 58 ng/L — ABNORMAL HIGH (ref <18)

## 2024-03-18 MED ORDER — SPIRONOLACTONE 12.5 MG HALF TABLET
25.0000 mg | ORAL_TABLET | Freq: Once | ORAL | Status: AC
  Administered 2024-03-18: 25 mg via ORAL
  Filled 2024-03-18: qty 2

## 2024-03-18 MED ORDER — LOSARTAN POTASSIUM 50 MG PO TABS
100.0000 mg | ORAL_TABLET | Freq: Once | ORAL | Status: AC
  Administered 2024-03-18: 100 mg via ORAL
  Filled 2024-03-18: qty 2

## 2024-03-18 MED ORDER — HYDRALAZINE HCL 20 MG/ML IJ SOLN
5.0000 mg | Freq: Once | INTRAMUSCULAR | Status: AC
  Administered 2024-03-18: 5 mg via INTRAVENOUS
  Filled 2024-03-18: qty 1

## 2024-03-18 NOTE — ED Notes (Signed)
 Reported new BP to EDP.

## 2024-03-18 NOTE — Discharge Instructions (Addendum)
 Go to your dialysis session this morning.  Make sure you take your medications every day as directed by your doctor.

## 2024-03-18 NOTE — ED Provider Notes (Signed)
 [place] EMERGENCY DEPARTMENT AT [facility] Provider Note   CSN: [id number] Arrival date & time: 03/18/24  9478     History Chief Complaint  Patient presents with   Hypertension    HPI Shawn Collins is a 73 y.o. male presenting for chief complaint of impedning sense of doom at night. States that it comes and goes. He has been having this sensation daily for weeks. States he is scheduled for dialysis and wants to keep that appointment. Denies any pain at all. Skipped his BP medications yesterday. Patient's recorded medical, surgical, social, medication list and allergies were reviewed in the Snapshot window as part of the initial history.   Review of Systems   Review of Systems  Constitutional:  Negative for chills and fever.  HENT:  Negative for ear pain and sore throat.   Eyes:  Negative for pain and visual disturbance.  Respiratory:  Negative for cough and shortness of breath.   Cardiovascular:  Negative for chest pain and palpitations.  Gastrointestinal:  Negative for abdominal pain and vomiting.  Genitourinary:  Negative for dysuria and hematuria.  Musculoskeletal:  Negative for arthralgias and back pain.  Skin:  Negative for color change and rash.  Neurological:  Negative for seizures and syncope.  All other systems reviewed and are negative.   Physical Exam Updated Vital Signs BP (!) 243/99   Pulse 69   Resp 16   SpO2 96%  Physical Exam Vitals and nursing note reviewed.  Constitutional:      General: He is not in acute distress.    Appearance: He is well-developed.  HENT:     Head: Normocephalic and atraumatic.  Eyes:     Conjunctiva/sclera: Conjunctivae normal.  Cardiovascular:     Rate and Rhythm: Normal rate and regular rhythm.     Heart sounds: No murmur heard. Pulmonary:     Effort: Pulmonary effort is normal. No respiratory distress.     Breath sounds: Normal breath sounds.  Abdominal:     Palpations: Abdomen is soft.      Tenderness: There is no abdominal tenderness.  Musculoskeletal:        General: No swelling.     Cervical back: Neck supple.  Skin:    General: Skin is warm and dry.     Capillary Refill: Capillary refill takes less than 2 seconds.  Neurological:     Mental Status: He is alert.  Psychiatric:        Mood and Affect: Mood normal.      ED Course/ Medical Decision Making/ A&P    Procedures Procedures   Medications Ordered in ED Medications  hydrALAZINE  (APRESOLINE ) injection 5 mg (5 mg Intravenous Given 03/18/24 0657)    Medical Decision Making:   This is a 73 year old male presenting with an impending sense of doom.  He denies any active chest pain and he is otherwise ambulatory tolerating p.o. intake.  He states it has been nightly for multiple nights.  Yesterday he tried not taking his medications to see if he would feel better but only felt worse tonight.  He arrives grossly hypertensive though otherwise in no acute distress.  EKG looks similar to prior.  ACS/PE/pneumothorax all considered less likely given the chronicity of the symptoms.  However given his multiple medical comorbidities, patient will warrant rule out for ACS with serial troponins.  Initial resulted lower than his baseline of 100.  Serial is still pending at time of signout to oncoming team.  EKG at baseline,  x-ray without other acute pathology.  Lab work consistent with his known ESRD.  He is scheduled for dialysis at 10:00 today.  Care patient signed out to oncoming team pending serial troponin and expected discharge to dialysis center for management of his hypervolemia evidence by his hypertension.  He was treated acutely with hydralazine  given this being a home medication for him and nonadherence yesterday with likely some rebound hypertension from his nonadherence.   Clinical Impression:  1. Hypertensive urgency      Data Unavailable   Final Clinical Impression(s) / ED Diagnoses Final diagnoses:   Hypertensive urgency    Rx / DC Orders ED Discharge Orders     None         Jerral Meth, MD 03/18/24 [phone]

## 2024-03-18 NOTE — ED Notes (Signed)
 [place], MD approved pt DC with BP 240/98 - Patient is going strait to dialysis from here.

## 2024-03-18 NOTE — ED Provider Notes (Signed)
 Pt signed out by Dr. Jerral pending 2nd trop.  2nd trop flat.  Pt has dialysis today at 1030 as an outpatient which is quicker than he could get it if he were an inpatient.   I think pt will feel better after he gets the fluid removed with dialysis.  Pt is stable for d/c.  Return if worse.    Dean Clarity, MD 03/18/24 [phone]

## 2024-03-18 NOTE — ED Triage Notes (Signed)
 Patient is coming from home. Patient tora called out because he stated that he feels like I'm about to die and has not slept in 5 days. Patient claims no substance use. Dialysis MWF, did receive dialysis Friday. A/Ox4 and no complaints of pain. EMS VS 231/102 BP 60 HR 100% RA 104 CBG

## 2024-03-22 ENCOUNTER — Observation Stay (HOSPITAL_COMMUNITY)

## 2024-03-22 ENCOUNTER — Emergency Department (HOSPITAL_COMMUNITY)

## 2024-03-22 ENCOUNTER — Inpatient Hospital Stay (HOSPITAL_COMMUNITY)
Admission: EM | Admit: 2024-03-22 | Discharge: 2024-03-29 | DRG: 304 | Disposition: A | Discharge status: Skilled Nursing Facility | Attending: Internal Medicine | Admitting: Internal Medicine

## 2024-03-22 ENCOUNTER — Other Ambulatory Visit: Payer: Self-pay

## 2024-03-22 ENCOUNTER — Encounter (HOSPITAL_COMMUNITY): Payer: Self-pay | Admitting: Internal Medicine

## 2024-03-22 DIAGNOSIS — Z8673 Personal history of transient ischemic attack (TIA), and cerebral infarction without residual deficits: Secondary | ICD-10-CM

## 2024-03-22 DIAGNOSIS — I96 Gangrene, not elsewhere classified: Secondary | ICD-10-CM

## 2024-03-22 DIAGNOSIS — I120 Hypertensive chronic kidney disease with stage 5 chronic kidney disease or end stage renal disease: Secondary | ICD-10-CM | POA: Diagnosis present

## 2024-03-22 DIAGNOSIS — I5A Non-ischemic myocardial injury (non-traumatic): Secondary | ICD-10-CM | POA: Diagnosis present

## 2024-03-22 DIAGNOSIS — Z7902 Long term (current) use of antithrombotics/antiplatelets: Secondary | ICD-10-CM

## 2024-03-22 DIAGNOSIS — Z833 Family history of diabetes mellitus: Secondary | ICD-10-CM

## 2024-03-22 DIAGNOSIS — Z79899 Other long term (current) drug therapy: Secondary | ICD-10-CM

## 2024-03-22 DIAGNOSIS — Z87891 Personal history of nicotine dependence: Secondary | ICD-10-CM

## 2024-03-22 DIAGNOSIS — J449 Chronic obstructive pulmonary disease, unspecified: Secondary | ICD-10-CM | POA: Diagnosis present

## 2024-03-22 DIAGNOSIS — I159 Secondary hypertension, unspecified: Secondary | ICD-10-CM

## 2024-03-22 DIAGNOSIS — N2581 Secondary hyperparathyroidism of renal origin: Secondary | ICD-10-CM | POA: Diagnosis present

## 2024-03-22 DIAGNOSIS — Z8249 Family history of ischemic heart disease and other diseases of the circulatory system: Secondary | ICD-10-CM

## 2024-03-22 DIAGNOSIS — N186 End stage renal disease: Secondary | ICD-10-CM | POA: Diagnosis present

## 2024-03-22 DIAGNOSIS — Z681 Body mass index (BMI) 19 or less, adult: Secondary | ICD-10-CM

## 2024-03-22 DIAGNOSIS — I1A0 Resistant hypertension: Secondary | ICD-10-CM | POA: Diagnosis present

## 2024-03-22 DIAGNOSIS — E1152 Type 2 diabetes mellitus with diabetic peripheral angiopathy with gangrene: Secondary | ICD-10-CM | POA: Diagnosis present

## 2024-03-22 DIAGNOSIS — D631 Anemia in chronic kidney disease: Secondary | ICD-10-CM | POA: Diagnosis present

## 2024-03-22 DIAGNOSIS — F419 Anxiety disorder, unspecified: Secondary | ICD-10-CM | POA: Diagnosis present

## 2024-03-22 DIAGNOSIS — Z66 Do not resuscitate: Secondary | ICD-10-CM | POA: Diagnosis present

## 2024-03-22 DIAGNOSIS — I252 Old myocardial infarction: Secondary | ICD-10-CM

## 2024-03-22 DIAGNOSIS — Z823 Family history of stroke: Secondary | ICD-10-CM

## 2024-03-22 DIAGNOSIS — E119 Type 2 diabetes mellitus without complications: Secondary | ICD-10-CM

## 2024-03-22 DIAGNOSIS — Z951 Presence of aortocoronary bypass graft: Secondary | ICD-10-CM

## 2024-03-22 DIAGNOSIS — Z992 Dependence on renal dialysis: Secondary | ICD-10-CM

## 2024-03-22 DIAGNOSIS — E785 Hyperlipidemia, unspecified: Secondary | ICD-10-CM | POA: Diagnosis present

## 2024-03-22 DIAGNOSIS — I161 Hypertensive emergency: Secondary | ICD-10-CM | POA: Diagnosis not present

## 2024-03-22 DIAGNOSIS — E1122 Type 2 diabetes mellitus with diabetic chronic kidney disease: Secondary | ICD-10-CM | POA: Diagnosis present

## 2024-03-22 DIAGNOSIS — R636 Underweight: Secondary | ICD-10-CM | POA: Diagnosis present

## 2024-03-22 DIAGNOSIS — I2510 Atherosclerotic heart disease of native coronary artery without angina pectoris: Secondary | ICD-10-CM | POA: Diagnosis present

## 2024-03-22 DIAGNOSIS — R4189 Other symptoms and signs involving cognitive functions and awareness: Secondary | ICD-10-CM

## 2024-03-22 DIAGNOSIS — I674 Hypertensive encephalopathy: Secondary | ICD-10-CM | POA: Diagnosis present

## 2024-03-22 DIAGNOSIS — Z7982 Long term (current) use of aspirin: Secondary | ICD-10-CM

## 2024-03-22 DIAGNOSIS — I10 Essential (primary) hypertension: Secondary | ICD-10-CM | POA: Diagnosis present

## 2024-03-22 LAB — CBC WITH DIFFERENTIAL/PLATELET
Abs Immature Granulocytes: 0.04 K/uL (ref 0.00–0.07)
Basophils Absolute: 0.1 K/uL (ref 0.0–0.1)
Basophils Relative: 1 %
Eosinophils Absolute: 0.1 K/uL (ref 0.0–0.5)
Eosinophils Relative: 2 %
HCT: 34.2 % — ABNORMAL LOW (ref 39.0–52.0)
Hemoglobin: 10.7 g/dL — ABNORMAL LOW (ref 13.0–17.0)
Immature Granulocytes: 1 %
Lymphocytes Relative: 18 %
Lymphs Abs: 0.9 K/uL (ref 0.7–4.0)
MCH: 30.8 pg (ref 26.0–34.0)
MCHC: 31.3 g/dL (ref 30.0–36.0)
MCV: 98.6 fL (ref 80.0–100.0)
Monocytes Absolute: 0.8 K/uL (ref 0.1–1.0)
Monocytes Relative: 16 %
Neutro Abs: 3.2 K/uL (ref 1.7–7.7)
Neutrophils Relative %: 62 %
Platelets: 218 K/uL (ref 150–400)
RBC: 3.47 MIL/uL — ABNORMAL LOW (ref 4.22–5.81)
RDW: 14.2 % (ref 11.5–15.5)
WBC: 5.1 K/uL (ref 4.0–10.5)
nRBC: 0 % (ref 0.0–0.2)

## 2024-03-22 LAB — COMPREHENSIVE METABOLIC PANEL WITH GFR
ALT: 19 U/L (ref 0–44)
AST: 29 U/L (ref 15–41)
Albumin: 3.7 g/dL (ref 3.5–5.0)
Alkaline Phosphatase: 59 U/L (ref 38–126)
Anion gap: 12 (ref 5–15)
BUN: 47 mg/dL — ABNORMAL HIGH (ref 8–23)
CO2: 25 mmol/L (ref 22–32)
Calcium: 8.8 mg/dL — ABNORMAL LOW (ref 8.9–10.3)
Chloride: 98 mmol/L (ref 98–111)
Creatinine, Ser: 7.86 mg/dL — ABNORMAL HIGH (ref 0.61–1.24)
GFR, Estimated: 7 mL/min — ABNORMAL LOW (ref >60)
Glucose, Bld: 99 mg/dL (ref 70–99)
Potassium: 5 mmol/L (ref 3.5–5.1)
Sodium: 135 mmol/L (ref 135–145)
Total Bilirubin: 0.6 mg/dL (ref 0.0–1.2)
Total Protein: 7.3 g/dL (ref 6.5–8.1)

## 2024-03-22 LAB — GLUCOSE, CAPILLARY: Glucose-Capillary: 191 mg/dL — ABNORMAL HIGH (ref 70–99)

## 2024-03-22 LAB — TROPONIN I (HIGH SENSITIVITY)
Troponin I (High Sensitivity): 48 ng/L — ABNORMAL HIGH (ref <18)
Troponin I (High Sensitivity): 50 ng/L — ABNORMAL HIGH (ref <18)

## 2024-03-22 LAB — HEMOGLOBIN A1C
Hgb A1c MFr Bld: 5 % (ref 4.8–5.6)
Mean Plasma Glucose: 96.8 mg/dL

## 2024-03-22 LAB — MRSA NEXT GEN BY PCR, NASAL: MRSA by PCR Next Gen: NOT DETECTED

## 2024-03-22 LAB — HEPATITIS B SURFACE ANTIGEN: Hepatitis B Surface Ag: NONREACTIVE

## 2024-03-22 LAB — VAS US ABI WITH/WO TBI
Left ABI: 1.21
Right ABI: 0.71

## 2024-03-22 LAB — TSH: TSH: 1.615 u[IU]/mL (ref 0.350–4.500)

## 2024-03-22 MED ORDER — ALTEPLASE 2 MG IJ SOLR: 2.0000 mg | Freq: Once | INTRAMUSCULAR | Status: DC | PRN

## 2024-03-22 MED ORDER — HEPARIN SODIUM (PORCINE) 5000 UNIT/ML IJ SOLN
5000.0000 [IU] | Freq: Three times a day (TID) | INTRAMUSCULAR | Status: DC
  Administered 2024-03-22 – 2024-03-29 (×19): 5000 [IU] via SUBCUTANEOUS
  Filled 2024-03-22 (×18): qty 1

## 2024-03-22 MED ORDER — INSULIN ASPART 100 UNIT/ML IJ SOLN
0.0000 [IU] | Freq: Three times a day (TID) | INTRAMUSCULAR | Status: DC
  Administered 2024-03-22 – 2024-03-23 (×2): 1 [IU] via SUBCUTANEOUS
  Administered 2024-03-24: 3 [IU] via SUBCUTANEOUS
  Administered 2024-03-26: 1 [IU] via SUBCUTANEOUS
  Administered 2024-03-27: 2 [IU] via SUBCUTANEOUS
  Administered 2024-03-28: 1 [IU] via SUBCUTANEOUS

## 2024-03-22 MED ORDER — HYDRALAZINE HCL 50 MG PO TABS
50.0000 mg | ORAL_TABLET | Freq: Three times a day (TID) | ORAL | Status: DC
  Administered 2024-03-22 – 2024-03-23 (×3): 50 mg via ORAL
  Filled 2024-03-22 (×3): qty 1

## 2024-03-22 MED ORDER — ALBUTEROL SULFATE (2.5 MG/3ML) 0.083% IN NEBU: 2.5000 mg | INHALATION_SOLUTION | RESPIRATORY_TRACT | Status: DC | PRN

## 2024-03-22 MED ORDER — SPIRONOLACTONE 25 MG PO TABS
25.0000 mg | ORAL_TABLET | Freq: Every day | ORAL | Status: DC
  Administered 2024-03-22 – 2024-03-28 (×7): 25 mg via ORAL
  Filled 2024-03-22 (×5): qty 1
  Filled 2024-03-22: qty 2
  Filled 2024-03-22: qty 1

## 2024-03-22 MED ORDER — LABETALOL HCL 5 MG/ML IV SOLN
INTRAVENOUS | Status: AC
  Filled 2024-03-22: qty 4

## 2024-03-22 MED ORDER — CLOPIDOGREL BISULFATE 75 MG PO TABS
75.0000 mg | ORAL_TABLET | Freq: Every day | ORAL | Status: DC
  Administered 2024-03-22 – 2024-03-29 (×8): 75 mg via ORAL
  Filled 2024-03-22 (×8): qty 1

## 2024-03-22 MED ORDER — SODIUM CHLORIDE 0.9% FLUSH
3.0000 mL | Freq: Two times a day (BID) | INTRAVENOUS | Status: DC
  Administered 2024-03-22 – 2024-03-28 (×14): 3 mL via INTRAVENOUS

## 2024-03-22 MED ORDER — PENTAFLUOROPROP-TETRAFLUOROETH EX AERO: 1.0000 | INHALATION_SPRAY | CUTANEOUS | Status: DC | PRN

## 2024-03-22 MED ORDER — LIDOCAINE-PRILOCAINE 2.5-2.5 % EX CREA
1.0000 | TOPICAL_CREAM | CUTANEOUS | Status: DC | PRN
Start: 2024-03-22 — End: 2024-03-22

## 2024-03-22 MED ORDER — AMLODIPINE BESYLATE 10 MG PO TABS
10.0000 mg | ORAL_TABLET | Freq: Every day | ORAL | Status: DC
  Administered 2024-03-22 – 2024-03-28 (×7): 10 mg via ORAL
  Filled 2024-03-22 (×5): qty 1
  Filled 2024-03-22: qty 2
  Filled 2024-03-22: qty 1

## 2024-03-22 MED ORDER — ANTICOAGULANT SODIUM CITRATE 4% (200MG/5ML) IV SOLN: 5.0000 mL | Status: DC | PRN

## 2024-03-22 MED ORDER — NEPRO/CARBSTEADY PO LIQD: 237.0000 mL | ORAL | Status: DC | PRN

## 2024-03-22 MED ORDER — LOSARTAN POTASSIUM 50 MG PO TABS: 100.0000 mg | ORAL_TABLET | Freq: Every day | ORAL | Status: DC

## 2024-03-22 MED ORDER — POLYETHYLENE GLYCOL 3350 17 G PO PACK
17.0000 g | PACK | Freq: Every day | ORAL | Status: DC | PRN
  Administered 2024-03-24 – 2024-03-25 (×2): 17 g via ORAL
  Filled 2024-03-22 (×2): qty 1

## 2024-03-22 MED ORDER — HYDROXYZINE HCL 10 MG PO TABS
10.0000 mg | ORAL_TABLET | Freq: Two times a day (BID) | ORAL | Status: DC | PRN
  Administered 2024-03-22 – 2024-03-28 (×6): 10 mg via ORAL
  Filled 2024-03-22 (×6): qty 1

## 2024-03-22 MED ORDER — HYDRALAZINE HCL 20 MG/ML IJ SOLN
20.0000 mg | Freq: Once | INTRAMUSCULAR | Status: AC
  Administered 2024-03-22: 20 mg via INTRAVENOUS
  Filled 2024-03-22: qty 1

## 2024-03-22 MED ORDER — HYDRALAZINE HCL 25 MG PO TABS
50.0000 mg | ORAL_TABLET | Freq: Once | ORAL | Status: AC
  Administered 2024-03-22: 50 mg via ORAL
  Filled 2024-03-22: qty 2

## 2024-03-22 MED ORDER — HEPARIN SODIUM (PORCINE) 1000 UNIT/ML DIALYSIS: 1000.0000 [IU] | INTRAMUSCULAR | Status: DC | PRN

## 2024-03-22 MED ORDER — MELATONIN 3 MG PO TABS
6.0000 mg | ORAL_TABLET | Freq: Every evening | ORAL | Status: DC | PRN
  Administered 2024-03-24 – 2024-03-28 (×4): 6 mg via ORAL
  Filled 2024-03-22 (×4): qty 2

## 2024-03-22 MED ORDER — LIDOCAINE HCL (PF) 1 % IJ SOLN: 5.0000 mL | INTRAMUSCULAR | Status: DC | PRN

## 2024-03-22 MED ORDER — LOSARTAN POTASSIUM 50 MG PO TABS
100.0000 mg | ORAL_TABLET | Freq: Every day | ORAL | Status: DC
  Administered 2024-03-22 – 2024-03-28 (×7): 100 mg via ORAL
  Filled 2024-03-22: qty 2
  Filled 2024-03-22 (×3): qty 4
  Filled 2024-03-22 (×2): qty 2
  Filled 2024-03-22: qty 4

## 2024-03-22 MED ORDER — LABETALOL HCL 5 MG/ML IV SOLN
10.0000 mg | Freq: Once | INTRAVENOUS | Status: AC
  Administered 2024-03-22: 10 mg via INTRAVENOUS
  Filled 2024-03-22: qty 4

## 2024-03-22 MED ORDER — LABETALOL HCL 5 MG/ML IV SOLN
10.0000 mg | INTRAVENOUS | Status: DC | PRN
  Administered 2024-03-22 (×3): 10 mg via INTRAVENOUS
  Filled 2024-03-22 (×2): qty 4

## 2024-03-22 MED ORDER — ONDANSETRON HCL 4 MG/2ML IJ SOLN
4.0000 mg | Freq: Four times a day (QID) | INTRAMUSCULAR | Status: DC | PRN
  Administered 2024-03-24 – 2024-03-26 (×2): 4 mg via INTRAVENOUS
  Filled 2024-03-22 (×2): qty 2

## 2024-03-22 MED ORDER — CHLORHEXIDINE GLUCONATE CLOTH 2 % EX PADS
6.0000 | MEDICATED_PAD | Freq: Every day | CUTANEOUS | Status: DC
  Administered 2024-03-22 – 2024-03-26 (×4): 6 via TOPICAL

## 2024-03-22 MED ORDER — ACETAMINOPHEN 500 MG PO TABS
1000.0000 mg | ORAL_TABLET | Freq: Four times a day (QID) | ORAL | Status: DC | PRN
  Administered 2024-03-26 – 2024-03-28 (×2): 1000 mg via ORAL
  Filled 2024-03-22 (×2): qty 2

## 2024-03-22 MED ORDER — LIDOCAINE-PRILOCAINE 2.5-2.5 % EX CREA: 1.0000 | TOPICAL_CREAM | CUTANEOUS | Status: DC | PRN

## 2024-03-22 MED ORDER — ATORVASTATIN CALCIUM 40 MG PO TABS
40.0000 mg | ORAL_TABLET | Freq: Every day | ORAL | Status: DC
  Administered 2024-03-22 – 2024-03-28 (×7): 40 mg via ORAL
  Filled 2024-03-22 (×7): qty 1

## 2024-03-22 MED ORDER — HYDRALAZINE HCL 20 MG/ML IJ SOLN
10.0000 mg | Freq: Once | INTRAMUSCULAR | Status: DC
  Filled 2024-03-22: qty 1

## 2024-03-22 MED ORDER — PENTAFLUOROPROP-TETRAFLUOROETH EX AERO
INHALATION_SPRAY | CUTANEOUS | Status: AC
  Filled 2024-03-22: qty 30

## 2024-03-22 NOTE — Assessment & Plan Note (Addendum)
-   patient oriented in ER, but cognition certainly slowed; unable to really explain reasoning for why he misses meds at home - HD center has also reached out with concerns about patient returning home safely and continuing to care for himself - I briefly brought up assisted living with patient this morning and he was amenable however not sure financial situation or if this has been evaluated in the past -SLP consulted for cognitive assessment - TOC consulted for placement of possible.  PT/OT evals ordered; SNF recommended

## 2024-03-22 NOTE — ED Notes (Signed)
 CCMD called. Pt on monitor

## 2024-03-22 NOTE — Assessment & Plan Note (Addendum)
-   s/p CABG 2018 - continue plavix , ARB, and statin

## 2024-03-22 NOTE — H&P (Addendum)
 History and Physical    Shawn Collins FMW:[id number] DOB: 1950-06-10 DOA: 03/22/2024  PCP: Shelda Atlas, MD   Patient coming from: Home   Chief Complaint:  Chief Complaint  Patient presents with   Hypertension    HPI:  Shawn Collins is a 73 y.o. male with hx of ESRD on MWF HD, CAD s/p CABG, CVA, COPD, hypertension, hyperlipidemia, recent frequent ED visits with hypertensive urgency/emergency, intermittent nonadherence with dialysis and antihypertensives, who presented due to uncontrolled hypertension at home, concerned he was going to die. On interview, he begins by telling me that he lost his wallet on the bus and forgot to see his primary care doctor. Not sure about what happened to bring him in today. Says that he thinks his BP numbers were in 140-150s then were reading as high which scared him and called EMS. He prepares his own medication, no family to help per his report. He has trouble listing his medications by memory. He does not like to prepare a medication tray because it scares him to take all of his medications at once. However, has never had issues with low BP at home. He acknowledges was not taking BP meds regularly until past couple days then reports taking as prescribed. Has been going to HD as Rx, due today. EDW per OP notes is ~55kg. He denies any headache, chest pain, SOB. Does seem to have significant anxiety, very fearful about being alone at night. He does not endorse generalized anxiety issues during the day. Is not on medication for mood at this point.       Review of Systems:  ROS complete and negative except as marked above   No Known Allergies  Prior to Admission medications   Medication Sig Start Date End Date Taking? Authorizing Provider  amLODipine  (NORVASC ) 10 MG tablet Take 1 tablet (10 mg total) by mouth at bedtime. 07/20/23   Rojelio Nest, DO  aspirin  81 MG chewable tablet Chew 1 tablet (81 mg total) by mouth daily. 07/20/23   Rojelio Nest, DO   atorvastatin  (LIPITOR ) 40 MG tablet Take 1 tablet (40 mg total) by mouth at bedtime. 07/20/23   Rojelio Nest, DO  clopidogrel  (PLAVIX ) 75 MG tablet Take 75 mg by mouth daily. 01/14/24   [provider]  hydrALAZINE  (APRESOLINE ) 50 MG tablet Take 1 tablet (50 mg total) by mouth every 8 (eight) hours. 07/20/23   Rojelio Nest, DO  losartan  (COZAAR ) 100 MG tablet Take 1 tablet (100 mg total) by mouth daily. 07/20/23   Rojelio Nest, DO  nitroGLYCERIN  (NITROSTAT ) 0.4 MG SL tablet Place 0.4 mg under the tongue every 5 (five) minutes as needed for chest pain.    [provider]  oxyCODONE  (ROXICODONE ) 5 MG immediate release tablet Take 1 tablet (5 mg total) by mouth every 6 (six) hours as needed for up to 18 doses for severe pain (pain score 7-10). 02/10/24   Franklyn Sid SAILOR, MD  sevelamer  carbonate (RENVELA ) 800 MG tablet Take 1,600 mg by mouth 3 (three) times daily with meals. Take 800mg  (1 tablet) with snacks 08/21/23   [provider]  spironolactone  (ALDACTONE ) 25 MG tablet Take 25 mg by mouth daily. 08/23/23   [provider]    Past Medical History:  Diagnosis Date   CAD (coronary artery disease), native coronary artery    s/p NSTEMI with cath showing severe 3VD s/p  CABG x 3 with LIMA to LAD, SVG to OM1/OM2 and SVG D1 by Dr. Army on 10/26/16.  COPD (chronic obstructive pulmonary disease) (HCC) 09/10/2014   CVA (cerebral vascular accident) (HCC) 12/18/2021   Diabetes mellitus without complication (HCC)    Type II   ESRD (end stage renal disease) (HCC)    Hyperlipidemia LDL goal <70 04/17/2017   Hypertension     Past Surgical History:  Procedure Laterality Date   A/V SHUNT INTERVENTION N/A 08/17/2023   Procedure: A/V SHUNT INTERVENTION;  Surgeon: Norine Manuelita LABOR, MD;  Location: [facility] INVASIVE CV LAB;  Service: Cardiovascular;  Laterality: N/A;   AV FISTULA PLACEMENT Left 12/21/2021   Procedure: INSERTION OF LEFT ARM ARTERIOVENOUS (AV) GORE-TEX GRAFT;   Surgeon: Eliza Lonni RAMAN, MD;  Location: [facility] OR;  Service: Vascular;  Laterality: Left;   CORONARY ARTERY BYPASS GRAFT N/A 10/26/2016   Procedure: CORONARY ARTERY BYPASS GRAFTING (CABG) x 4 USING LEFT INTERNAL MAMMARY ARTERY TO LAD AND ENDOSCOPICALLY HARVESTED GREATER SAPHENOUS VEIN TO OM 1, 2 AND TO DIAG;  Surgeon: Army Dallas NOVAK, MD;  Location: [facility] OR;  Service: Open Heart Surgery;  Laterality: N/A;   ENDOVEIN HARVEST OF GREATER SAPHENOUS VEIN Right 10/26/2016   Procedure: ENDOVEIN HARVEST OF GREATER SAPHENOUS VEIN;  Surgeon: Army Dallas NOVAK, MD;  Location: [facility] OR;  Service: Open Heart Surgery;  Laterality: Right;   IR THORACENTESIS ASP PLEURAL SPACE W/IMG GUIDE  11/03/2016   LEFT HEART CATH AND CORONARY ANGIOGRAPHY N/A 10/21/2016   Procedure: Left Heart Cath and Coronary Angiography;  Surgeon: Swaziland, Peter M, MD;  Location: [facility] INVASIVE CV LAB;  Service: Cardiovascular;  Laterality: N/A;   TEE WITHOUT CARDIOVERSION N/A 10/26/2016   Procedure: TRANSESOPHAGEAL ECHOCARDIOGRAM (TEE);  Surgeon: Army Dallas NOVAK, MD;  Location: [facility] OR;  Service: Open Heart Surgery;  Laterality: N/A;     reports that he quit smoking about 53 years ago. His smoking use included cigarettes. He started smoking about 61 years ago. He has a 2 pack-year smoking history. He has never used smokeless tobacco. He reports that he does not currently use alcohol. He reports that he does not currently use drugs.  Family History  Problem Relation Age of Onset   Diabetes Mellitus II Mother    Stroke Father    Hypertension Father    Diabetes Mellitus II Sister      Physical Exam: Vitals:   03/22/24 0400 03/22/24 0410 03/22/24 0413 03/22/24 0430  BP: (!) 257/109  (!) 231/90 (!) 222/91  Pulse: 69  71 75  Resp: 12  19 18   Temp:  (!) 97.4 F (36.3 C) (!) 97.4 F (36.3 C)   TempSrc:  Axillary Axillary   SpO2: 100%  100% 100%  Weight:      Height:        Gen: Awake, alert, chronically ill appearing.   CV: Regular,  normal S1, S2, 1/6 SEM  Resp: Normal WOB, CTAB  Abd: Flat, normoactive, nontender MSK: Symmetric, trace edema  Skin: Dry appearing gangrene in the R 2nd distal toe and the L 5th toe. No other rashes or lesions to exposed skin  Neuro: Alert and interactive, he is oriented although slow to respond on the year.  Psych: affect is anxious and uncomfortable appearing, thought process is sometimes tangiential. Appears to have issues with short term memory    Data review:   Labs reviewed, notable for:   Creatinine 7.86, yesterday K5 Bicarb 25 BUN 47 High-sensitivity Trop 58 -> 48 Hemoglobin 10.7 near recent  Micro:  Results for orders placed or performed during the hospital encounter of 09/13/23  MRSA  Next Gen by PCR, Nasal     Status: None   Collection Time: 09/13/23  9:41 PM   Specimen: Nasal Mucosa; Nasal Swab  Result Value Ref Range Status   MRSA by PCR Next Gen NOT DETECTED NOT DETECTED Final    Comment: (NOTE) The GeneXpert MRSA Assay (FDA approved for NASAL specimens only), is one component of a comprehensive MRSA colonization surveillance program. It is not intended to diagnose MRSA infection nor to guide or monitor treatment for MRSA infections. Test performance is not FDA approved in patients less than 21 years old. Performed at [facility], [address] [place], [place] [address]     Imaging reviewed:  CT HEAD WO CONTRAST ( ) Result Date: 03/22/2024 EXAM: CT HEAD WITHOUT CONTRAST 03/22/2024 04:48:24 AM TECHNIQUE: CT of the head was performed without the administration of intravenous contrast. Automated exposure control, iterative reconstruction, and/or weight based adjustment of the mA/kV was utilized to reduce the radiation dose to as low as reasonably achievable. COMPARISON: MRI 12/23/2021. Head CT 02/14/2024. CLINICAL HISTORY: Mental status change, unknown cause. Patient arrives home BIB [facility] who called because patient thought his CBG monitor was reading as  high but it was his BP that was elevated. Patient was confused, Aox3. Dialysis patient going MWF, fistula to L forearm. Patient reporting that he called EMS as well because when I'm by myself I feel like I'm dying. FINDINGS: BRAIN AND VENTRICLES: No acute hemorrhage. No evidence of acute infarct. No hydrocephalus. No extra-axial collection. No mass effect or midline shift. Advanced calcified atherosclerosis. Stable cerebral volume. Patchy chronic white matter disease, asymmetric chronic involvement of the left deep white matter capsules stable from previous MRI. Stable mild deep gray nuclei heterogeneity elsewhere. Stable faint basal ganglia vascular calcifications. No suspicious intracranial vascular hyperdensity. ORBITS: No acute abnormality. SINUSES: No acute abnormality. SOFT TISSUES AND SKULL: No acute soft tissue abnormality. No skull fracture. IMPRESSION: 1. No acute intracranial abnormality. 2. 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 Electronically signed by: Helayne Hurst MD 03/22/2024 05:32 AM EDT RP Workstation: [id number]    EKG:  Personally reviewed, sinus rhythm, PRWP, likely repolarization changes anteriorly, no acute ischemic changes  ED Course:  Treated with hydralazine  20 mg IV x1,   Assessment/Plan:  73 y.o. male with hx ESRD on MWF HD, CAD s/p CABG, CVA, COPD, hypertension, hyperlipidemia, recent frequent ED visits with hypertensive urgency/emergency, intermittent nonadherence with dialysis and antihypertensives, who presented due to uncontrolled hypertension at home  Hypertensive emergency   Associated acute on chronic myocardial injury  Recurrent ED visits with severe range hypertension, chronic associated myocardial injury. Has predominantly systolic hypertension. Reports checking BP at home and reading high, no associated symptoms. On initial eval BP max 255/90, MAP 146. HS trop 58 -> 48, EKG without acute ischemic changes. CXR pending for volume status. CT head  no acute findings.  -- s/p Hydralazine  20 mg IV x1, give Labetalol  10 mg IV x1 -- OK without gtt for now as myocardial injury improving trend and no other end organ damage  -- Resume home antihypertensives including Amlodipine  10 mg, Hydralazine  50 mg TID, Losartan  100 mg daily, Spironolactone  25 mg daily; Fill history reviewed and appears appropriate  -- Considering his frequent hospital utilization and intermittent issues with adherence, would consider something like a Clonidine  patch / longer acting therapies rather than short acting like hydralazine . Would also benefit from help in home to set up his meds for the week.  -- Should f/u for further  evaluation of his resistant hypertension outpatient   ESRD on MWF HD  -- Routine Nephrology consult for HD this morning, not contacted overnight   Dry gangrene, PAD  Appears to have dry gangrene of the L 5th and R 2nd distal toes, likely underlying significant PAD with his hx vasculopathy.  -- Screening ABI, should establish with vascular surgery as outpatient   ? Neurocognitive disorder  He reports more issues with short term memory recently.  -- Recommend OP evaluation for dementia  -- Could benefit from home health aide to help especially set up meds, TOC eval   ? Underlying anxiety disorder  -- trial hydroxyzine  10 mg BID prn  -- Would consider SSRI as outpatient   Chronic medical problems:  CAD s/p CABG: continue home DAPT, Atorvastatin  CVA: see CAD above  COPD: nebs prn  HLD: see CAD    Body mass index is 19.55 kg/m. Underweight    DVT prophylaxis:  heparin   Code Status:  DNR with Intubation; confirmed with patient  Diet:  Diet Orders (From admission, onward)    None      Family Communication:  None   Consults:  None   Admission status:   Observation, Step Down Unit  Severity of Illness: The appropriate patient status for this patient is OBSERVATION. Observation status is judged to be reasonable and necessary in order  to provide the required intensity of service to ensure the patient's safety. The patient's presenting symptoms, physical exam findings, and initial radiographic and laboratory data in the context of their medical condition is felt to place them at decreased risk for further clinical deterioration. Furthermore, it is anticipated that the patient will be medically stable for discharge from the hospital within 2 midnights of admission.    Dorn Dawson, MD [facility]  How to contact the [facility] Attending or Consulting provider 7A - 7P or covering provider during after hours 7P -7A, for this patient.  Check the care team in [facility] and look for a) attending/consulting [facility] provider listed and b) the [facility] team listed Log into [email or web address] and use [facility] universal password to access. If you do not have the password, please contact the hospital operator. Locate the [facility] provider you are looking for under [facility] and page to a number that you can be directly reached. If you still have difficulty reaching the provider, please page the [facility] (Director on Call) for the Hospitalists listed on amion for assistance.  03/22/2024, 5:43 AM

## 2024-03-22 NOTE — Assessment & Plan Note (Addendum)
-   A1c 5% - SSI

## 2024-03-22 NOTE — Progress Notes (Signed)
 Pt receives out-pt HD at [facility] on [facility] on MWF 11:05 am chair time. Navigator was contacted by pt's out-pt HD CSW to voice some concerns regarding pt's safety at home and feels SNF/ALF may need to be discussed with pt. Navigator made attending and renal PA aware of clinic's concerns. Will assist as needed.   Randine Mungo Dialysis Navigator [phone]

## 2024-03-22 NOTE — Assessment & Plan Note (Addendum)
-   Recurrent ED visits with severe range hypertension, chronic associated myocardial injury. Has predominantly systolic hypertension. Reports checking BP at home and reading high, no associated symptoms.  - On initial eval BP max 255/90, MAP 146. HS trop 58 -> 48, EKG without acute ischemic changes. CT head no acute findings.  - Continue home regimen along with PRN meds - increasing hydralazine  10/25 - Fill history reviewed and appears appropriate

## 2024-03-22 NOTE — Assessment & Plan Note (Signed)
 Continue breathing treatments

## 2024-03-22 NOTE — Progress Notes (Signed)
 Progress Note    Shawn Collins   FMW:[id number]  DOB: [birth year]  DOA: 03/22/2024     0 PCP: Shelda Atlas, MD  Initial CC: elevated BP  Hospital Course: Shawn Collins is a 73 yo male with PMH ESRD on HD, CAD, COPD, CVA, DM II, HTN, HLD who presented to the ER via EMS with elevated blood pressure. He has had 7 ER visits over the past 2 months all for uncontrolled blood pressure. He has been forgetful at home with possibly not taking medications.  There has been concern that he may not be able to continue living safely at home due to cognitive impairment. Presentation this time similar.  He thought he was checking glucose levels when in fact his blood pressure was found to be uncontrolled. He states he did go to dialysis this past Wednesday. HD center has also contacted the hospital due to concerns for patient returning safely at home.  He was admitted for further blood pressure control and ongoing dialysis during hospitalization.  PT, OT consulted as well as speech for cognitive evaluation.  Interval History:  Laying in bed when seen in the ER this morning.  No distress but appears confused.  Difficult time explaining his living situation and why he misses some of his meds at home.  States he went to dialysis on Wednesday. There is concern for him returning home and continuing to manage taking care of himself.  Assessment and Plan: * Hypertensive emergency-resolved as of 03/22/2024 - Recurrent ED visits with severe range hypertension, chronic associated myocardial injury. Has predominantly systolic hypertension. Reports checking BP at home and reading high, no associated symptoms.  - On initial eval BP max 255/90, MAP 146. HS trop 58 -> 48, EKG without acute ischemic changes. CT head no acute findings.  - Continue home regimen along with PRN meds - Fill history reviewed and appears appropriate  Cognitive impairment - patient oriented in ER, but cognition certainly slowed; unable  to really explain reasoning for why he misses meds at home - HD center has also reached out with concerns about patient returning home safely and continuing to care for himself - I briefly brought up assisted living with patient this morning and he was amenable however not sure financial situation or if this has been evaluated in the past -SLP consulted for cognitive assessment - TOC consulted for placement of possible.  PT/OT evals ordered  ESRD (end stage renal disease) on dialysis [facility]) - Nephrology consulted for ongoing HD during hospitalization  HTN (hypertension) - continue amlodipine , hydralazine , losartan , spironolactone   Dry gangrene (HCC) Appears to have dry gangrene of the L 5th and R 2nd distal toes, likely underlying significant PAD with his hx vasculopathy.  -- Screening ABI, should establish with vascular surgery as outpatient   COPD (chronic obstructive pulmonary disease) (HCC) - Continue breathing treatments  CAD (coronary artery disease) - s/p CABG 2018 - continue plavix , ARB, and statin  Type 2 diabetes mellitus (HCC) - repeat A1c now - SSI  History of CVA (cerebrovascular accident) - continue statin and plavix    Hyperlipidemia LDL goal <70 - continue statin   Antimicrobials:   DVT prophylaxis:  heparin  injection 5,000 Units Start: 03/22/24 1400   Code Status:   Code Status: Do not attempt resuscitation (DNR) PRE-ARREST INTERVENTIONS DESIRED  Mobility Assessment (Last 72 Hours)     Mobility Assessment   No documentation.     Barriers to discharge: Potential disposition issues Disposition Plan: TBD HH orders placed: N/A Status  is: Observation  Objective: Blood pressure (!) 166/75, pulse 66, temperature 98 F (36.7 C), temperature source Oral, resp. rate 15, height 5' 10 (1.778 m), weight 61.8 kg, SpO2 100%.  Examination:  Physical Exam Constitutional:      General: He is not in acute distress.    Appearance: Normal appearance.  HENT:      Head: Normocephalic and atraumatic.     Mouth/Throat:     Mouth: Mucous membranes are moist.  Eyes:     Extraocular Movements: Extraocular movements intact.  Cardiovascular:     Rate and Rhythm: Normal rate and regular rhythm.  Pulmonary:     Effort: Pulmonary effort is normal. No respiratory distress.     Breath sounds: Normal breath sounds. No wheezing.  Abdominal:     General: Bowel sounds are normal. There is no distension.     Palpations: Abdomen is soft.     Tenderness: There is no abdominal tenderness.  Musculoskeletal:        General: Normal range of motion.     Cervical back: Normal range of motion and neck supple.  Skin:    General: Skin is warm and dry.  Neurological:     General: No focal deficit present.     Mental Status: He is alert and oriented to person, place, and time.  Psychiatric:        Mood and Affect: Mood normal.      Consultants:  Nephrology  Procedures:    Data Reviewed: Results for orders placed or performed during the hospital encounter of 03/22/24 (from the past 24 hours)  CBC with Differential/Platelet     Status: Abnormal   Collection Time: 03/22/24  3:46 AM  Result Value Ref Range   WBC 5.1 4.0 - 10.5 K/uL   RBC 3.47 (L) 4.22 - 5.81 MIL/uL   Hemoglobin 10.7 (L) 13.0 - 17.0 g/dL   HCT 65.7 (L) 60.9 - 47.9 %   MCV 98.6 80.0 - 100.0 fL   MCH 30.8 26.0 - 34.0 pg   MCHC 31.3 30.0 - 36.0 g/dL   RDW 85.7 88.4 - 84.4 %   Platelets 218 150 - 400 K/uL   nRBC 0.0 0.0 - 0.2 %   Neutrophils Relative % 62 %   Neutro Abs 3.2 1.7 - 7.7 K/uL   Lymphocytes Relative 18 %   Lymphs Abs 0.9 0.7 - 4.0 K/uL   Monocytes Relative 16 %   Monocytes Absolute 0.8 0.1 - 1.0 K/uL   Eosinophils Relative 2 %   Eosinophils Absolute 0.1 0.0 - 0.5 K/uL   Basophils Relative 1 %   Basophils Absolute 0.1 0.0 - 0.1 K/uL   Immature Granulocytes 1 %   Abs Immature Granulocytes 0.04 0.00 - 0.07 K/uL  Comprehensive metabolic panel with GFR     Status: Abnormal    Collection Time: 03/22/24  3:46 AM  Result Value Ref Range   Sodium 135 135 - 145 mmol/L   Potassium 5.0 3.5 - 5.1 mmol/L   Chloride 98 98 - 111 mmol/L   CO2 25 22 - 32 mmol/L   Glucose, Bld 99 70 - 99 mg/dL   BUN 47 (H) 8 - 23 mg/dL   Creatinine, Ser 2.13 (H) 0.61 - 1.24 mg/dL   Calcium  8.8 (L) 8.9 - 10.3 mg/dL   Total Protein 7.3 6.5 - 8.1 g/dL   Albumin  3.7 3.5 - 5.0 g/dL   AST 29 15 - 41 U/L   ALT 19 0 - 44 U/L  Alkaline Phosphatase 59 38 - 126 U/L   Total Bilirubin 0.6 0.0 - 1.2 mg/dL   GFR, Estimated 7 (L) >60 mL/min   Anion gap 12 5 - 15  Troponin I (High Sensitivity)     Status: Abnormal   Collection Time: 03/22/24  3:46 AM  Result Value Ref Range   Troponin I (High Sensitivity) 48 (H) <18 ng/L  Troponin I (High Sensitivity)     Status: Abnormal   Collection Time: 03/22/24  6:15 AM  Result Value Ref Range   Troponin I (High Sensitivity) 50 (H) <18 ng/L  TSH     Status: None   Collection Time: 03/22/24  6:15 AM  Result Value Ref Range   TSH 1.615 0.350 - 4.500 uIU/mL  Hepatitis B surface antigen     Status: None   Collection Time: 03/22/24  8:36 AM  Result Value Ref Range   Hepatitis B Surface Ag NON REACTIVE NON REACTIVE    I have reviewed pertinent nursing notes, vitals, labs, and images as necessary. I have ordered labwork to follow up on as indicated.  I have reviewed the last notes from staff over past 24 hours. I have discussed patient's care plan and test results with nursing staff, CM/SW, and other staff as appropriate.  Old records reviewed in assessment of this patient  Time spent: Greater than 50% of the 55 minute visit was spent in counseling/coordination of care for the patient as laid out in the A&P.   LOS: 0 days   Alm Apo, MD [facility] 03/22/2024, 12:19 PM

## 2024-03-22 NOTE — Assessment & Plan Note (Addendum)
-   Nephrology consulted for ongoing HD during hospitalization

## 2024-03-22 NOTE — Assessment & Plan Note (Signed)
 continue statin

## 2024-03-22 NOTE — Hospital Course (Addendum)
 Shawn Collins is a 73 yo male with PMH ESRD on HD, CAD, COPD, CVA, DM II, HTN, HLD who presented to the ER via EMS with elevated blood pressure. He has had 7 ER visits over the past 2 months all for uncontrolled blood pressure. He has been forgetful at home with possibly not taking medications.  There has been concern that he may not be able to continue living safely at home due to cognitive impairment. Presentation this time similar.  He thought he was checking glucose levels when in fact his blood pressure was found to be uncontrolled. He states he did go to dialysis this past Wednesday. HD center has also contacted the hospital due to concerns for patient returning safely at home.  He was admitted for further blood pressure control and ongoing dialysis during hospitalization.  PT, OT consulted as well as speech for cognitive evaluation.  Assessment and Plan: Hypertensive emergency Recurrent ED visits with severe range hypertension, chronic associated myocardial injury. Has predominantly systolic hypertension. Reports checking BP at home and reading high, no associated symptoms.  On initial eval BP max 255/90, MAP 146. HS trop 58 -> 48, EKG without acute ischemic changes. CT head no acute findings.  Continue home regimen along with PRN meds   Hypertensive encephalopathy. patient oriented in ER, but cognition certainly slowed; unable to really explain reasoning for why he misses meds at home HD center has also reached out with concerns about patient returning home safely and continuing to care for himself SLP consulted for cognitive assessment TOC consulted for placement of possible.  PT/OT evals ordered; SNF recommended   ESRD (end stage renal disease) on dialysis [facility]) Nephrology consulted for ongoing HD during hospitalization   Dry gangrene right foot. 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 Appears to have dry gangrene of the L 5th and R 2nd distal  toes, likely underlying significant PAD with his hx vasculopathy.  Establish with vascular surgery as outpatient   COPD Continue breathing treatments   CAD (coronary artery disease) s/p CABG 2018 continue plavix , ARB, and statin   Type 2 diabetes mellitus (HCC) - A1c 5% - SSI   History of CVA (cerebrovascular accident) - continue statin and plavix     Hyperlipidemia LDL goal <70 - continue statin   Underweight. Body mass index is 18.22 kg/m.  Placing the patient at high risk for poor outcome.

## 2024-03-22 NOTE — ED Provider Notes (Signed)
 [place] EMERGENCY DEPARTMENT AT [facility] Provider Note   CSN: [id number] Arrival date & time: 03/22/24  9667     Patient presents with: Hypertension   Shawn Collins is a 73 y.o. male.   Patient presents to the emergency department for evaluation of hypertension.  It is not exactly clear what prompted him to check his blood pressure.  He was alone, became concerned that he was going to die, called EMS.  Patient is a dialysis patient, reports that he did undergo dialysis yesterday.       Prior to Admission medications   Medication Sig Start Date End Date Taking? Authorizing Provider  amLODipine  (NORVASC ) 10 MG tablet Take 1 tablet (10 mg total) by mouth at bedtime. 07/20/23  Yes Rojelio Nest, DO  aspirin  81 MG chewable tablet Chew 1 tablet (81 mg total) by mouth daily. 07/20/23  Yes Rojelio Nest, DO  atorvastatin  (LIPITOR ) 40 MG tablet Take 1 tablet (40 mg total) by mouth at bedtime. 07/20/23  Yes Rojelio Nest, DO  clopidogrel  (PLAVIX ) 75 MG tablet Take 75 mg by mouth daily. 01/14/24  Yes [provider]  hydrALAZINE  (APRESOLINE ) 50 MG tablet Take 1 tablet (50 mg total) by mouth every 8 (eight) hours. 07/20/23  Yes Rojelio Nest, DO  losartan  (COZAAR ) 100 MG tablet Take 1 tablet (100 mg total) by mouth daily. 07/20/23  Yes Rojelio Nest, DO  spironolactone  (ALDACTONE ) 25 MG tablet Take 25 mg by mouth daily. 08/23/23  Yes [provider]  nitroGLYCERIN  (NITROSTAT ) 0.4 MG SL tablet Place 0.4 mg under the tongue every 5 (five) minutes as needed for chest pain.    [provider]  oxyCODONE  (ROXICODONE ) 5 MG immediate release tablet Take 1 tablet (5 mg total) by mouth every 6 (six) hours as needed for up to 18 doses for severe pain (pain score 7-10). Patient not taking: Reported on 03/22/2024 02/10/24   Franklyn Sid SAILOR, MD  sevelamer  carbonate (RENVELA ) 800 MG tablet Take 1,600 mg by mouth 3 (three) times daily with meals. Take 800mg  (1 tablet)  with snacks Patient not taking: Reported on 03/22/2024 08/21/23   [provider]    Allergies: Patient has no known allergies.    Review of Systems  Updated Vital Signs BP (!) 184/92 (BP Location: Right Arm)   Pulse 69   Temp 98.2 F (36.8 C) (Oral)   Resp 18   Ht 5' 10 (1.778 m)   Wt 56.5 kg   SpO2 96%   BMI 17.87 kg/m   Physical Exam Vitals and nursing note reviewed.  Constitutional:      General: He is not in acute distress.    Appearance: He is well-developed.  HENT:     Head: Normocephalic and atraumatic.     Mouth/Throat:     Mouth: Mucous membranes are moist.  Eyes:     General: Vision grossly intact. Gaze aligned appropriately.     Extraocular Movements: Extraocular movements intact.     Conjunctiva/sclera: Conjunctivae normal.  Cardiovascular:     Rate and Rhythm: Normal rate and regular rhythm.     Pulses: Normal pulses.     Heart sounds: Normal heart sounds, S1 normal and S2 normal. No murmur heard.    No friction rub. No gallop.  Pulmonary:     Effort: Pulmonary effort is normal. No respiratory distress.     Breath sounds: Normal breath sounds.  Abdominal:     Palpations: Abdomen is soft.     Tenderness: There  is no abdominal tenderness. There is no guarding or rebound.     Hernia: No hernia is present.  Musculoskeletal:        General: No swelling.     Cervical back: Full passive range of motion without pain, normal range of motion and neck supple. No pain with movement, spinous process tenderness or muscular tenderness. Normal range of motion.     Right lower leg: No edema.     Left lower leg: No edema.  Skin:    General: Skin is warm and dry.     Capillary Refill: Capillary refill takes less than 2 seconds.     Findings: No ecchymosis, erythema, lesion or wound.  Neurological:     Mental Status: He is alert and oriented to person, place, and time.     GCS: GCS eye subscore is 4. GCS verbal subscore is 5. GCS motor subscore is 6.      Cranial Nerves: Cranial nerves 2-12 are intact.     Sensory: Sensation is intact.     Motor: Motor function is intact. No weakness or abnormal muscle tone.     Coordination: Coordination is intact.  Psychiatric:        Mood and Affect: Mood normal.        Speech: Speech normal.        Behavior: Behavior normal.     (all labs ordered are listed, but only abnormal results are displayed) Labs Reviewed  CBC WITH DIFFERENTIAL/PLATELET - Abnormal; Notable for the following components:      Result Value   RBC 3.47 (*)    Hemoglobin 10.7 (*)    HCT 34.2 (*)    All other components within normal limits  COMPREHENSIVE METABOLIC PANEL WITH GFR - Abnormal; Notable for the following components:   BUN 47 (*)    Creatinine, Ser 7.86 (*)    Calcium  8.8 (*)    GFR, Estimated 7 (*)    All other components within normal limits  CBC WITH DIFFERENTIAL/PLATELET - Abnormal; Notable for the following components:   RBC 3.32 (*)    Hemoglobin 10.2 (*)    HCT 31.0 (*)    All other components within normal limits  RENAL FUNCTION PANEL - Abnormal; Notable for the following components:   Sodium 132 (*)    Chloride 94 (*)    Glucose, Bld 158 (*)    Creatinine, Ser 5.10 (*)    Calcium  8.3 (*)    Albumin  3.0 (*)    GFR, Estimated 11 (*)    All other components within normal limits  GLUCOSE, CAPILLARY - Abnormal; Notable for the following components:   Glucose-Capillary 191 (*)    All other components within normal limits  TROPONIN I (HIGH SENSITIVITY) - Abnormal; Notable for the following components:   Troponin I (High Sensitivity) 48 (*)    All other components within normal limits  TROPONIN I (HIGH SENSITIVITY) - Abnormal; Notable for the following components:   Troponin I (High Sensitivity) 50 (*)    All other components within normal limits  MRSA NEXT GEN BY PCR, NASAL  TSH  HEPATITIS B SURFACE ANTIGEN  HEPATITIS B SURFACE ANTIBODY, QUANTITATIVE  HEMOGLOBIN A1C  MAGNESIUM     EKG: EKG  Interpretation Date/Time:  Friday March 22 2024 04:06:43 EDT Ventricular Rate:  72 PR Interval:  168 QRS Duration:  90 QT Interval:  418 QTC Calculation: 458 R Axis:   42  Text Interpretation: Sinus rhythm Consider left ventricular hypertrophy No significant change  since last tracing Confirmed by Haze Lonni PARAS [phone]) on 03/22/2024 5:03:29 AM  Radiology: VAS US  ABI WITH/WO TBI Result Date: 03/22/2024  LOWER EXTREMITY DOPPLER STUDY Patient Name:  DANYAEL ALIPIO  Date of Exam:   03/22/2024 Medical Rec #: [id number]      Accession #:    [id number] Date of Birth: Jun 20, 1950      Patient Gender: M Patient Age:   17 years Exam Location:  [facility] Procedure:      VAS US  ABI WITH/WO TBI Referring Phys: DORN DAWSON --------------------------------------------------------------------------------  Indications: Gangrene. Dry extremity. High Risk Factors: Hypertension, hyperlipidemia, Diabetes, past history of                    smoking, coronary artery disease, prior CVA.  Vascular Interventions: S/P CABG. Limitations: Today's exam was limited due to involuntary patient movement and              patient positioning. Comparison Study: No prior exam. Performing Technologist: Edilia Elden Appl  Examination Guidelines: A complete evaluation includes at minimum, Doppler waveform signals and systolic blood pressure reading at the level of bilateral brachial, anterior tibial, and posterior tibial arteries, when vessel segments are accessible. Bilateral testing is considered an integral part of a complete examination. Photoelectric Plethysmograph (PPG) waveforms and toe systolic pressure readings are included as required and additional duplex testing as needed. Limited examinations for reoccurring indications may be performed as noted.  ABI Findings: +---------+------------------+-----+----------+--------+ Right    Rt Pressure (mmHg)IndexWaveform  Comment   +---------+------------------+-----+----------+--------+ Brachial 181                    triphasic          +---------+------------------+-----+----------+--------+ PTA      254               1.40 monophasic         +---------+------------------+-----+----------+--------+ DP       127               0.70 monophasic         +---------+------------------+-----+----------+--------+ Great Toe114               0.63 Abnormal           +---------+------------------+-----+----------+--------+ +---------+------------------+-----+----------+-----------+ Left     Lt Pressure (mmHg)IndexWaveform  Comment     +---------+------------------+-----+----------+-----------+ Brachial                                  Restricted. +---------+------------------+-----+----------+-----------+ PTA      219               1.21 monophasic            +---------+------------------+-----+----------+-----------+ DP       129               0.71 monophasic            +---------+------------------+-----+----------+-----------+ Great Toe101               0.56 Abnormal              +---------+------------------+-----+----------+-----------+ +-------+-----------+-----------+------------+------------+ ABI/TBIToday's ABIToday's TBIPrevious ABIPrevious TBI +-------+-----------+-----------+------------+------------+ Right  0.71       0.63                                +-------+-----------+-----------+------------+------------+ Left   1.21  0.63                                +-------+-----------+-----------+------------+------------+ Arterial wall calcification precludes accurate ankle pressures and ABIs. Falsely elevated velocities in the left lower extremity.  Summary: Right: Resting right ankle-brachial index indicates noncompressible right lower extremity arteries. The right toe-brachial index is abnormal.  Left: Resting left ankle-brachial index is within normal range. The left  toe-brachial index is abnormal.  *See table(s) above for measurements and observations.  Electronically signed by Fonda Rim on 03/22/2024 at 6:53:58 PM.    Final    DG CHEST PORT 1 VIEW Result Date: 03/22/2024 EXAM: 1 VIEW(S) XRAY OF THE CHEST 03/22/2024 06:24:58 AM COMPARISON: 03/18/2024 CLINICAL HISTORY: Severe hypertension [id number]. home BIB [facility] who called because patient thought his CBG monitor was reading as high but it was his BP that was elevated. FINDINGS: LUNGS AND PLEURA: No focal pulmonary opacity. No pulmonary edema. No pleural effusion. No pneumothorax. HEART AND MEDIASTINUM: Aortic arch calcification. CABG markers noted. BONES AND SOFT TISSUES: Sternotomy wires noted. No acute osseous abnormality. IMPRESSION: 1. No acute cardiopulmonary process identified. Electronically signed by: Waddell Calk MD 03/22/2024 07:07 AM EDT RP Workstation: [id number]   CT HEAD WO CONTRAST ( ) Result Date: 03/22/2024 EXAM: CT HEAD WITHOUT CONTRAST 03/22/2024 04:48:24 AM TECHNIQUE: CT of the head was performed without the administration of intravenous contrast. Automated exposure control, iterative reconstruction, and/or weight based adjustment of the mA/kV was utilized to reduce the radiation dose to as low as reasonably achievable. COMPARISON: MRI 12/23/2021. Head CT 02/14/2024. CLINICAL HISTORY: Mental status change, unknown cause. Patient arrives home BIB [facility] who called because patient thought his CBG monitor was reading as high but it was his BP that was elevated. Patient was confused, Aox3. Dialysis patient going MWF, fistula to L forearm. Patient reporting that he called EMS as well because when I'm by myself I feel like I'm dying. FINDINGS: BRAIN AND VENTRICLES: No acute hemorrhage. No evidence of acute infarct. No hydrocephalus. No extra-axial collection. No mass effect or midline shift. Advanced calcified atherosclerosis. Stable cerebral volume. Patchy chronic white matter disease,  asymmetric chronic involvement of the left deep white matter capsules stable from previous MRI. Stable mild deep gray nuclei heterogeneity elsewhere. Stable faint basal ganglia vascular calcifications. No suspicious intracranial vascular hyperdensity. ORBITS: No acute abnormality. SINUSES: No acute abnormality. SOFT TISSUES AND SKULL: No acute soft tissue abnormality. No skull fracture. IMPRESSION: 1. No acute intracranial abnormality. 2. 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 Electronically signed by: Helayne Hurst MD 03/22/2024 05:32 AM EDT RP Workstation: [id number]     Procedures   Medications Ordered in the ED  amLODipine  (NORVASC ) tablet 10 mg (10 mg Oral Given 03/22/24 0701)  atorvastatin  (LIPITOR ) tablet 40 mg (40 mg Oral Given 03/22/24 2140)  hydrALAZINE  (APRESOLINE ) tablet 50 mg (50 mg Oral Given 03/23/24 0149)  losartan  (COZAAR ) tablet 100 mg (100 mg Oral Given 03/22/24 0701)  spironolactone  (ALDACTONE ) tablet 25 mg (25 mg Oral Given 03/22/24 0701)  clopidogrel  (PLAVIX ) tablet 75 mg (75 mg Oral Given 03/22/24 0926)  hydrOXYzine  (ATARAX ) tablet 10 mg (10 mg Oral Given 03/22/24 1950)  labetalol  (NORMODYNE ) injection 10 mg (10 mg Intravenous Given 03/22/24 1947)  heparin  injection 5,000 Units (5,000 Units Subcutaneous Given 03/23/24 0149)  sodium chloride  flush (NS) 0.9 % injection 3 mL (3 mLs Intravenous Given 03/22/24 2141)  acetaminophen  (TYLENOL ) tablet 1,000 mg (has no administration in time  range)  albuterol  (PROVENTIL ) (2.5 MG/3ML) 0.083% nebulizer solution 2.5 mg (has no administration in time range)  melatonin tablet 6 mg (has no administration in time range)  ondansetron  (ZOFRAN ) injection 4 mg (has no administration in time range)  polyethylene glycol (MIRALAX  / GLYCOLAX ) packet 17 g (has no administration in time range)  Chlorhexidine  Gluconate Cloth 2 % PADS 6 each (6 each Topical Given 03/22/24 1934)  insulin  aspart (novoLOG ) injection 0-6 Units (1 Units  Subcutaneous Given 03/22/24 2140)  hydrALAZINE  (APRESOLINE ) injection 20 mg (20 mg Intravenous Given 03/22/24 0407)  hydrALAZINE  (APRESOLINE ) tablet 50 mg (50 mg Oral Given 03/22/24 0605)  labetalol  (NORMODYNE ) injection 10 mg (10 mg Intravenous Given 03/22/24 0608)                                    Medical Decision Making Amount and/or Complexity of Data Reviewed Labs: ordered. Radiology: ordered.  Risk Prescription drug management. Decision regarding hospitalization.   Presents to the urgency department for evaluation of hypertension.  Patient reports that he has been compliant with dialysis and his medications recently, but was off of his medications for some time in the recent past.  Patient reports that this has caused him to check his blood pressures at home and it was very elevated.  Patient with blood pressures over 250 systolic at arrival.  He was treated with IV hydralazine  with some improvement.  Will give additional doses of IV hydralazine  and administer his home medications, admit patient for further management and likely dialysis.  CRITICAL CARE Performed by: Lonni JINNY Seats   Total critical care time: 30 minutes  Critical care time was exclusive of separately billable procedures and treating other patients.  Critical care was necessary to treat or prevent imminent or life-threatening deterioration.  Critical care was time spent personally by me on the following activities: development of treatment plan with patient and/or surrogate as well as nursing, discussions with consultants, evaluation of patient's response to treatment, examination of patient, obtaining history from patient or surrogate, ordering and performing treatments and interventions, ordering and review of laboratory studies, ordering and review of radiographic studies, pulse oximetry and re-evaluation of patient's condition.      Final diagnoses:  None    ED Discharge Orders     None           Cheskel Silverio, Lonni JINNY, MD 03/23/24 [phone]

## 2024-03-22 NOTE — Assessment & Plan Note (Addendum)
-   continue amlodipine , hydralazine , losartan , spironolactone  - Hydralazine  increased on 03/23/2024 - BP still above goal at times; clonidine  added 03/26/2024; follow-up blood pressure response

## 2024-03-22 NOTE — Assessment & Plan Note (Signed)
-  continue statin and plavix

## 2024-03-22 NOTE — Assessment & Plan Note (Signed)
 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  -- Screening ABI, should establish with vascular surgery as outpatient

## 2024-03-22 NOTE — Consult Note (Addendum)
 [facility] Renal Consultation Note    Indication for Consultation:  Management of ESRD/hemodialysis; anemia, hypertension/volume and secondary hyperparathyroidism  ERE:Jcalzmz, Aliene, MD  HPI: Shawn Collins is a 73 y.o. male with ESRD on HD MWF at [facility] He has a past medical history significant for CAD, COPD, CVA, DM II, HTN, HLD.   Patient presented to the ED for uncontrolled HTN. It was noted patient has been in the ED seven times 2nd to this issue. There has been ongoing concerns, especially from his outpatient HD center, on whether he is able to take care of himself. There is concern that he is not taking his home medications which is causing this situation. Patient with noted cognitive impairment but unclear if this has been officially diagnosed. Patient has been in a SNF in the past.  Seen and examined patient at bedside. He remains on RA and denies SOB, CP, and N/V. I again explained to him of our concerns on whether he can take care of himself at home including taking his home medications as prescribed. Current renal labs are acceptable and CXR is unremarkable. Discussed with Hospitalist. Plan for patient to be admitted. SLP, TOC, and PT/OT have all been consulted. Will continue dialysis while he in inpatient. Plan for HD today per his usual schedule.  Past Medical History:  Diagnosis Date   CAD (coronary artery disease), native coronary artery    s/p NSTEMI with cath showing severe 3VD s/p  CABG x 3 with LIMA to LAD, SVG to OM1/OM2 and SVG D1 by Dr. Army on 10/26/16.   COPD (chronic obstructive pulmonary disease) (HCC) 09/10/2014   CVA (cerebral vascular accident) (HCC) 12/18/2021   Diabetes mellitus without complication (HCC)    Type II   ESRD (end stage renal disease) (HCC)    Hyperlipidemia LDL goal <70 04/17/2017   Hypertension    Past Surgical History:  Procedure Laterality Date   A/V SHUNT INTERVENTION N/A 08/17/2023   Procedure: A/V  SHUNT INTERVENTION;  Surgeon: Norine Manuelita LABOR, MD;  Location: [facility] INVASIVE CV LAB;  Service: Cardiovascular;  Laterality: N/A;   AV FISTULA PLACEMENT Left 12/21/2021   Procedure: INSERTION OF LEFT ARM ARTERIOVENOUS (AV) GORE-TEX GRAFT;  Surgeon: Eliza Lonni RAMAN, MD;  Location: [facility] OR;  Service: Vascular;  Laterality: Left;   CORONARY ARTERY BYPASS GRAFT N/A 10/26/2016   Procedure: CORONARY ARTERY BYPASS GRAFTING (CABG) x 4 USING LEFT INTERNAL MAMMARY ARTERY TO LAD AND ENDOSCOPICALLY HARVESTED GREATER SAPHENOUS VEIN TO OM 1, 2 AND TO DIAG;  Surgeon: Army Dallas NOVAK, MD;  Location: [facility] OR;  Service: Open Heart Surgery;  Laterality: N/A;   ENDOVEIN HARVEST OF GREATER SAPHENOUS VEIN Right 10/26/2016   Procedure: ENDOVEIN HARVEST OF GREATER SAPHENOUS VEIN;  Surgeon: Army Dallas NOVAK, MD;  Location: [facility] OR;  Service: Open Heart Surgery;  Laterality: Right;   IR THORACENTESIS ASP PLEURAL SPACE W/IMG GUIDE  11/03/2016   LEFT HEART CATH AND CORONARY ANGIOGRAPHY N/A 10/21/2016   Procedure: Left Heart Cath and Coronary Angiography;  Surgeon: Swazil[place], Peter M, MD;  Location: [facility] INVASIVE CV LAB;  Service: Cardiovascular;  Laterality: N/A;   TEE WITHOUT CARDIOVERSION N/A 10/26/2016   Procedure: TRANSESOPHAGEAL ECHOCARDIOGRAM (TEE);  Surgeon: Army Dallas NOVAK, MD;  Location: [facility] OR;  Service: Open Heart Surgery;  Laterality: N/A;   Family History  Problem Relation Age of Onset   Diabetes Mellitus II Mother    Stroke Father    Hypertension Father    Diabetes Mellitus II Sister  Social History:  reports that he quit smoking about 53 years ago. His smoking use included cigarettes. He started smoking about 61 years ago. He has a 2 pack-year smoking history. He has never used smokeless tobacco. He reports that he does not currently use alcohol. He reports that he does not currently use drugs. No Known Allergies Prior to Admission medications   Medication Sig Start Date End Date Taking? Authorizing Provider   amLODipine  (NORVASC ) 10 MG tablet Take 1 tablet (10 mg total) by mouth at bedtime. 07/20/23  Yes Rojelio Nest, DO  aspirin  81 MG chewable tablet Chew 1 tablet (81 mg total) by mouth daily. 07/20/23  Yes Rojelio Nest, DO  atorvastatin  (LIPITOR ) 40 MG tablet Take 1 tablet (40 mg total) by mouth at bedtime. 07/20/23  Yes Rojelio Nest, DO  clopidogrel  (PLAVIX ) 75 MG tablet Take 75 mg by mouth daily. 01/14/24  Yes [provider]  hydrALAZINE  (APRESOLINE ) 50 MG tablet Take 1 tablet (50 mg total) by mouth every 8 (eight) hours. 07/20/23  Yes Rojelio Nest, DO  losartan  (COZAAR ) 100 MG tablet Take 1 tablet (100 mg total) by mouth daily. 07/20/23  Yes Rojelio Nest, DO  spironolactone  (ALDACTONE ) 25 MG tablet Take 25 mg by mouth daily. 08/23/23  Yes [provider]  nitroGLYCERIN  (NITROSTAT ) 0.4 MG SL tablet Place 0.4 mg under the tongue every 5 (five) minutes as needed for chest pain.    [provider]  oxyCODONE  (ROXICODONE ) 5 MG immediate release tablet Take 1 tablet (5 mg total) by mouth every 6 (six) hours as needed for up to 18 doses for severe pain (pain score 7-10). Patient not taking: Reported on 03/22/2024 02/10/24   Franklyn Sid SAILOR, MD  sevelamer  carbonate (RENVELA ) 800 MG tablet Take 1,600 mg by mouth 3 (three) times daily with meals. Take 800mg  (1 tablet) with snacks Patient not taking: Reported on 03/22/2024 08/21/23   [provider]   Current Facility-Administered Medications  Medication Dose Route Frequency Provider Last Rate Last Admin   acetaminophen  (TYLENOL ) tablet 1,000 mg  1,000 mg Oral Q6H PRN Segars, Dorn, MD       albuterol  (PROVENTIL ) (2.5 MG/3ML) 0.083% nebulizer solution 2.5 mg  2.5 mg Nebulization Q4H PRN Segars, Dorn, MD       alteplase  (CATHFLO ACTIVASE ) injection 2 mg  2 mg Intracatheter Once PRN Zarian Colpitts E, NP       amLODipine  (NORVASC ) tablet 10 mg  10 mg Oral Daily Segars, Dorn, MD   10 mg at 03/22/24 0701    anticoagulant sodium citrate  solution 5 mL  5 mL Intracatheter PRN Patsy Lenis, MD       atorvastatin  (LIPITOR ) tablet 40 mg  40 mg Oral QHS Segars, Dorn, MD       Chlorhexidine  Gluconate Cloth 2 % PADS 6 each  6 each Topical Q0600 Lenon Charmaine BRAVO, NP       clopidogrel  (PLAVIX ) tablet 75 mg  75 mg Oral Daily Segars, Dorn, MD   75 mg at 03/22/24 0926   feeding supplement (NEPRO CARB STEADY) liquid 237 mL  237 mL Oral PRN Patsy Lenis, MD       heparin  injection 1,000 Units  1,000 Units Dialysis PRN Lenon Charmaine BRAVO, NP       heparin  injection 5,000 Units  5,000 Units Subcutaneous Q8H Segars, Dorn, MD       hydrALAZINE  (APRESOLINE ) tablet 50 mg  50 mg Oral Q8H Segars, Jonathan, MD       hydrOXYzine  (ATARAX )  tablet 10 mg  10 mg Oral BID PRN Segars, Jonathan, MD       insulin  aspart (novoLOG ) injection 0-6 Units  0-6 Units Subcutaneous TID AC & HS Patsy Lenis, MD       labetalol  (NORMODYNE ) injection 10 mg  10 mg Intravenous Q2H PRN Segars, Jonathan, MD   10 mg at 03/22/24 0749   lidocaine  (PF) (XYLOCAINE ) 1 % injection 5 mL  5 mL Intradermal PRN Lenon Charmaine BRAVO, NP       lidocaine -prilocaine  (EMLA ) cream 1 Application  1 Application Topical PRN Lenon Charmaine BRAVO, NP       losartan  (COZAAR ) tablet 100 mg  100 mg Oral Daily Segars, Jonathan, MD   100 mg at 03/22/24 0701   melatonin tablet 6 mg  6 mg Oral QHS PRN Segars, Jonathan, MD       ondansetron  (ZOFRAN ) injection 4 mg  4 mg Intravenous Q6H PRN Segars, Dorn, MD       pentafluoroprop-tetrafluoroeth (GEBAUERS) aerosol 1 Application  1 Application Topical PRN Lenon Charmaine BRAVO, NP       polyethylene glycol (MIRALAX  / GLYCOLAX ) packet 17 g  17 g Oral Daily PRN Keturah Dorn, MD       sodium chloride  flush (NS) 0.9 % injection 3 mL  3 mL Intravenous Q12H Segars, Jonathan, MD   3 mL at 03/22/24 0836   spironolactone  (ALDACTONE ) tablet 25 mg  25 mg Oral Daily Segars, Jonathan, MD   25 mg at 03/22/24 0701    Current Outpatient Medications  Medication Sig Dispense Refill   amLODipine  (NORVASC ) 10 MG tablet Take 1 tablet (10 mg total) by mouth at bedtime.     aspirin  81 MG chewable tablet Chew 1 tablet (81 mg total) by mouth daily.     atorvastatin  (LIPITOR ) 40 MG tablet Take 1 tablet (40 mg total) by mouth at bedtime.     clopidogrel  (PLAVIX ) 75 MG tablet Take 75 mg by mouth daily.     hydrALAZINE  (APRESOLINE ) 50 MG tablet Take 1 tablet (50 mg total) by mouth every 8 (eight) hours.     losartan  (COZAAR ) 100 MG tablet Take 1 tablet (100 mg total) by mouth daily.     spironolactone  (ALDACTONE ) 25 MG tablet Take 25 mg by mouth daily.     nitroGLYCERIN  (NITROSTAT ) 0.4 MG SL tablet Place 0.4 mg under the tongue every 5 (five) minutes as needed for chest pain.     oxyCODONE  (ROXICODONE ) 5 MG immediate release tablet Take 1 tablet (5 mg total) by mouth every 6 (six) hours as needed for up to 18 doses for severe pain (pain score 7-10). (Patient not taking: Reported on 03/22/2024) 18 tablet 0   sevelamer  carbonate (RENVELA ) 800 MG tablet Take 1,600 mg by mouth 3 (three) times daily with meals. Take 800mg  (1 tablet) with snacks (Patient not taking: Reported on 03/22/2024)     Labs: Basic Metabolic Panel: Recent Labs  Lab 03/18/24 0532 03/22/24 0346  NA 136 135  K 5.2* 5.0  CL 99 98  CO2 21* 25  GLUCOSE 79 99  BUN 77* 47*  CREATININE 11.31* 7.86*  CALCIUM  8.8* 8.8*   Liver Function Tests: Recent Labs  Lab 03/18/24 0532 03/22/24 0346  AST 29 29  ALT 17 19  ALKPHOS 56 59  BILITOT 0.6 0.6  PROT 6.8 7.3  ALBUMIN  3.5 3.7   No results for input(s): LIPASE, AMYLASE in the last 168 hours. No results for input(s): AMMONIA in the last 168 hours.  CBC: Recent Labs  Lab 03/18/24 0532 03/22/24 0346  WBC 4.0 5.1  NEUTROABS 2.4 3.2  HGB 10.7* 10.7*  HCT 33.4* 34.2*  MCV 96.0 98.6  PLT 210 218   Cardiac Enzymes: No results for input(s): CKTOTAL, CKMB, CKMBINDEX, TROPONINI in  the last 168 hours. CBG: No results for input(s): GLUCAP in the last 168 hours. Iron Studies: No results for input(s): IRON, TIBC, TRANSFERRIN, FERRITIN in the last 72 hours. Studies/Results: DG CHEST PORT 1 VIEW Result Date: 03/22/2024 EXAM: 1 VIEW(S) XRAY OF THE CHEST 03/22/2024 06:24:58 AM COMPARISON: 03/18/2024 CLINICAL HISTORY: Severe hypertension [id number]. home BIB [facility] who called because patient thought his CBG monitor was reading as high but it was his BP that was elevated. FINDINGS: LUNGS AND PLEURA: No focal pulmonary opacity. No pulmonary edema. No pleural effusion. No pneumothorax. HEART AND MEDIASTINUM: Aortic arch calcification. CABG markers noted. BONES AND SOFT TISSUES: Sternotomy wires noted. No acute osseous abnormality. IMPRESSION: 1. No acute cardiopulmonary process identified. Electronically signed by: Waddell Calk MD 03/22/2024 07:07 AM EDT RP Workstation: [id number]   CT HEAD WO CONTRAST ( ) Result Date: 03/22/2024 EXAM: CT HEAD WITHOUT CONTRAST 03/22/2024 04:48:24 AM TECHNIQUE: CT of the head was performed without the administration of intravenous contrast. Automated exposure control, iterative reconstruction, and/or weight based adjustment of the mA/kV was utilized to reduce the radiation dose to as low as reasonably achievable. COMPARISON: MRI 12/23/2021. Head CT 02/14/2024. CLINICAL HISTORY: Mental status change, unknown cause. Patient arrives home BIB [facility] who called because patient thought his CBG monitor was reading as high but it was his BP that was elevated. Patient was confused, Aox3. Dialysis patient going MWF, fistula to L forearm. Patient reporting that he called EMS as well because when I'm by myself I feel like I'm dying. FINDINGS: BRAIN AND VENTRICLES: No acute hemorrhage. No evidence of acute infarct. No hydrocephalus. No extra-axial collection. No mass effect or midline shift. Advanced calcified atherosclerosis. Stable cerebral volume. Patchy  chronic white matter disease, asymmetric chronic involvement of the left deep white matter capsules stable from previous MRI. Stable mild deep gray nuclei heterogeneity elsewhere. Stable faint basal ganglia vascular calcifications. No suspicious intracranial vascular hyperdensity. ORBITS: No acute abnormality. SINUSES: No acute abnormality. SOFT TISSUES AND SKULL: No acute soft tissue abnormality. No skull fracture. IMPRESSION: 1. No acute intracranial abnormality. 2. 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 Electronically signed by: Helayne Hurst MD 03/22/2024 05:32 AM EDT RP Workstation: [id number]    ROS: All others negative except those listed in HPI.   Physical Exam: Vitals:   03/22/24 1015 03/22/24 1030 03/22/24 1045 03/22/24 1100  BP: (!) 172/70 (!) 179/87 (!) 175/74 (!) 166/75  Pulse: 62 66 70 66  Resp: 11 18 (!) 9 15  Temp:      TempSrc:      SpO2: 99% 100% 97% 100%  Weight:      Height:         General: Alert to himself; forgetful, on RA, NAD Head: Sclera not icteric Lungs: CTA bilaterally. No wheeze, rales or rhonchi. Breathing is unlabored. Heart: RRR. No murmur, rubs or gallops.  Abdomen: soft and non-tender  Lower extremities: no edema, ischemic changes, or open wounds  Neuro: AAOx3. Moves all extremities spontaneously. Dialysis Access: L AVG (+) B/T  Dialysis Orders:  MWF - [facility] 3hrs8min, BFR 450, DFR 800,  EDW 55.5kg, 2K/ 2.5Ca Heparin  None ordered Mircera 75 mcg q4wks - last 10/22 Hectorol  3mcg IV qHD  - last 10/22 Sensipar   30mg  with HD - last 10/22  Last Labs: Hgb 10.7, K 5.0, Ca 8.8, Alb 3.7  Assessment/Plan: Hypertensive emergency - 2nd not taking home BP medications. Resume home regimen here ESRD - on HD MWF. Plan for HD today per his usual schedule Hypertension/volume  - Not overloaded on exam. See above. Anemia of CKD - Hgb 10.7. ESA given on 10/22 in outpatient so not due yet. Monitor trend Secondary Hyperparathyroidism -   Checking phos in AM. Resuming Hectorol  and Sensipar  Nutrition - Renal diet with fluid restriction Dispo - Admitted. There has been ongoing concerns from his outpatient HD center on whether he can take care of himself at home. There is high suspicion he is not taking any of his medications 2nd cognitive impairment. Discussed with Hospitalist. Patient will be getting the w/u for this. TOC and PT/OT consulted for placement recommendations. Appreciate assistance with all disciplines.  Charmaine Piety, NP [facility] 03/22/2024, 12:27 PM

## 2024-03-22 NOTE — ED Triage Notes (Signed)
 Patient arrives home BIB [facility] who called because patient thought his CBG monitor was reading as high but it was his BP that was elevated. Patient was confused, Aox3. Dialysis patient going MWF, fistula to L forearm. Patient reporting that he called EMS as well because when I'm by myself I feel like I'm dying.    BP: 250/90 HR: 70

## 2024-03-22 NOTE — Procedures (Signed)
 Received patient in bed to unit.  Alert and oriented.  Informed consent signed and in chart.  All procedures explained. LLA AVG cannulated x 2 with 15g. Needles x 2  per policy. Tx initiated per MD order. UF goal 2000 ml. UF goal reduced to 1500 ml per C.Anderson NP. 1725 B/P high, medicated per MAR.  TX duration: 3.5 hours  Tx complete. Blood returned. Needles removed, sites held x 2 until hemostasis achieved.   Patient tolerated well.  Transported back to the room  Alert, without acute distress.  Hand-off given to patient's nurse.   Access used: LLA AVG Access issues: none  Total UF removed: 1500 ml Medication(s) given: See MAR   [facility]

## 2024-03-23 DIAGNOSIS — N186 End stage renal disease: Secondary | ICD-10-CM | POA: Diagnosis not present

## 2024-03-23 DIAGNOSIS — Z7902 Long term (current) use of antithrombotics/antiplatelets: Secondary | ICD-10-CM | POA: Diagnosis not present

## 2024-03-23 DIAGNOSIS — E785 Hyperlipidemia, unspecified: Secondary | ICD-10-CM | POA: Diagnosis present

## 2024-03-23 DIAGNOSIS — E1152 Type 2 diabetes mellitus with diabetic peripheral angiopathy with gangrene: Secondary | ICD-10-CM | POA: Diagnosis present

## 2024-03-23 DIAGNOSIS — I161 Hypertensive emergency: Secondary | ICD-10-CM | POA: Diagnosis not present

## 2024-03-23 DIAGNOSIS — R636 Underweight: Secondary | ICD-10-CM | POA: Diagnosis present

## 2024-03-23 DIAGNOSIS — Z87891 Personal history of nicotine dependence: Secondary | ICD-10-CM | POA: Diagnosis not present

## 2024-03-23 DIAGNOSIS — Z7982 Long term (current) use of aspirin: Secondary | ICD-10-CM | POA: Diagnosis not present

## 2024-03-23 DIAGNOSIS — R4189 Other symptoms and signs involving cognitive functions and awareness: Secondary | ICD-10-CM | POA: Diagnosis not present

## 2024-03-23 DIAGNOSIS — I2510 Atherosclerotic heart disease of native coronary artery without angina pectoris: Secondary | ICD-10-CM | POA: Diagnosis present

## 2024-03-23 DIAGNOSIS — I159 Secondary hypertension, unspecified: Secondary | ICD-10-CM | POA: Diagnosis not present

## 2024-03-23 DIAGNOSIS — Z992 Dependence on renal dialysis: Secondary | ICD-10-CM

## 2024-03-23 DIAGNOSIS — E1122 Type 2 diabetes mellitus with diabetic chronic kidney disease: Secondary | ICD-10-CM | POA: Diagnosis present

## 2024-03-23 DIAGNOSIS — I120 Hypertensive chronic kidney disease with stage 5 chronic kidney disease or end stage renal disease: Secondary | ICD-10-CM | POA: Diagnosis present

## 2024-03-23 DIAGNOSIS — Z8249 Family history of ischemic heart disease and other diseases of the circulatory system: Secondary | ICD-10-CM | POA: Diagnosis not present

## 2024-03-23 DIAGNOSIS — Z66 Do not resuscitate: Secondary | ICD-10-CM | POA: Diagnosis present

## 2024-03-23 DIAGNOSIS — D631 Anemia in chronic kidney disease: Secondary | ICD-10-CM | POA: Diagnosis present

## 2024-03-23 DIAGNOSIS — J449 Chronic obstructive pulmonary disease, unspecified: Secondary | ICD-10-CM | POA: Diagnosis present

## 2024-03-23 DIAGNOSIS — Z833 Family history of diabetes mellitus: Secondary | ICD-10-CM | POA: Diagnosis not present

## 2024-03-23 DIAGNOSIS — I674 Hypertensive encephalopathy: Secondary | ICD-10-CM | POA: Diagnosis present

## 2024-03-23 DIAGNOSIS — Z681 Body mass index (BMI) 19 or less, adult: Secondary | ICD-10-CM | POA: Diagnosis not present

## 2024-03-23 DIAGNOSIS — Z951 Presence of aortocoronary bypass graft: Secondary | ICD-10-CM | POA: Diagnosis not present

## 2024-03-23 DIAGNOSIS — Z79899 Other long term (current) drug therapy: Secondary | ICD-10-CM | POA: Diagnosis not present

## 2024-03-23 DIAGNOSIS — I5A Non-ischemic myocardial injury (non-traumatic): Secondary | ICD-10-CM | POA: Diagnosis present

## 2024-03-23 DIAGNOSIS — I1A0 Resistant hypertension: Secondary | ICD-10-CM | POA: Diagnosis present

## 2024-03-23 DIAGNOSIS — N2581 Secondary hyperparathyroidism of renal origin: Secondary | ICD-10-CM | POA: Diagnosis present

## 2024-03-23 LAB — RENAL FUNCTION PANEL
Albumin: 3 g/dL — ABNORMAL LOW (ref 3.5–5.0)
Anion gap: 11 (ref 5–15)
BUN: 23 mg/dL (ref 8–23)
CO2: 27 mmol/L (ref 22–32)
Calcium: 8.3 mg/dL — ABNORMAL LOW (ref 8.9–10.3)
Chloride: 94 mmol/L — ABNORMAL LOW (ref 98–111)
Creatinine, Ser: 5.1 mg/dL — ABNORMAL HIGH (ref 0.61–1.24)
GFR, Estimated: 11 mL/min — ABNORMAL LOW (ref >60)
Glucose, Bld: 158 mg/dL — ABNORMAL HIGH (ref 70–99)
Phosphorus: 3.8 mg/dL (ref 2.5–4.6)
Potassium: 3.7 mmol/L (ref 3.5–5.1)
Sodium: 132 mmol/L — ABNORMAL LOW (ref 135–145)

## 2024-03-23 LAB — CBC WITH DIFFERENTIAL/PLATELET
Abs Immature Granulocytes: 0.04 K/uL (ref 0.00–0.07)
Basophils Absolute: 0.1 K/uL (ref 0.0–0.1)
Basophils Relative: 2 %
Eosinophils Absolute: 0.1 K/uL (ref 0.0–0.5)
Eosinophils Relative: 2 %
HCT: 31 % — ABNORMAL LOW (ref 39.0–52.0)
Hemoglobin: 10.2 g/dL — ABNORMAL LOW (ref 13.0–17.0)
Immature Granulocytes: 1 %
Lymphocytes Relative: 16 %
Lymphs Abs: 0.8 K/uL (ref 0.7–4.0)
MCH: 30.7 pg (ref 26.0–34.0)
MCHC: 32.9 g/dL (ref 30.0–36.0)
MCV: 93.4 fL (ref 80.0–100.0)
Monocytes Absolute: 0.7 K/uL (ref 0.1–1.0)
Monocytes Relative: 14 %
Neutro Abs: 3.3 K/uL (ref 1.7–7.7)
Neutrophils Relative %: 65 %
Platelets: 237 K/uL (ref 150–400)
RBC: 3.32 MIL/uL — ABNORMAL LOW (ref 4.22–5.81)
RDW: 13.9 % (ref 11.5–15.5)
WBC: 4.9 K/uL (ref 4.0–10.5)
nRBC: 0 % (ref 0.0–0.2)

## 2024-03-23 LAB — GLUCOSE, CAPILLARY
Glucose-Capillary: 69 mg/dL — ABNORMAL LOW (ref 70–99)
Glucose-Capillary: 91 mg/dL (ref 70–99)
Glucose-Capillary: 144 mg/dL — ABNORMAL HIGH (ref 70–99)
Glucose-Capillary: 187 mg/dL — ABNORMAL HIGH (ref 70–99)
Glucose-Capillary: 196 mg/dL — ABNORMAL HIGH (ref 70–99)

## 2024-03-23 LAB — HEPATITIS B SURFACE ANTIBODY, QUANTITATIVE: Hep B S AB Quant (Post): 23.3 m[IU]/mL

## 2024-03-23 LAB — MAGNESIUM: Magnesium: 1.8 mg/dL (ref 1.7–2.4)

## 2024-03-23 MED ORDER — LABETALOL HCL 5 MG/ML IV SOLN
10.0000 mg | INTRAVENOUS | Status: DC | PRN
  Administered 2024-03-23 – 2024-03-26 (×6): 10 mg via INTRAVENOUS
  Filled 2024-03-23 (×7): qty 4

## 2024-03-23 MED ORDER — HYDRALAZINE HCL 50 MG PO TABS
100.0000 mg | ORAL_TABLET | Freq: Three times a day (TID) | ORAL | Status: DC
  Administered 2024-03-23 – 2024-03-29 (×17): 100 mg via ORAL
  Filled 2024-03-23 (×19): qty 2

## 2024-03-23 MED ORDER — HYDRALAZINE HCL 50 MG PO TABS
50.0000 mg | ORAL_TABLET | ORAL | Status: AC
  Administered 2024-03-23: 50 mg via ORAL
  Filled 2024-03-23: qty 1

## 2024-03-23 NOTE — Progress Notes (Signed)
 Patient ID: Shawn Collins, male   DOB: [birth year], 73 y.o.   MRN: [id number] [facility] Progress Note   Assessment/ Plan:   1.  Hypertensive urgency: With malignant hypertension upon presentation secondary to nonadherence with oral antihypertensive therapy.  Suspect that he has an underlying cognitive impairment that has been progressive and limiting his insight/adherence to prescribed therapies.  Restarted on antihypertensive therapy with blood pressures still remaining elevated and anticipate to improve with time. 2. ESRD: Typically on MWF dialysis schedule and underwent hemodialysis yesterday afternoon without problems. 3. Anemia: Hemoglobin and hematocrit level currently within acceptable range.  No overt blood loss and will defer ESA therapy at this time. 4. CKD-MBD: Corrected calcium  level and phosphorus level are both at goal, continue current management with renal diet. 5.  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 6.  Cognitive impairment: Ongoing evaluation by primary service/SLP and associated PT/OT evaluations ordered with consideration for assisted living facility.  Subjective:   Without acute events overnight, tolerated dialysis without problems.  He inquires about going home.   Objective:   BP (!) 208/76 (BP Location: Right Arm)   Pulse 71   Temp 98.4 F (36.9 C) (Oral)   Resp 19   Ht 5' 10 (1.778 m)   Wt 59.9 kg   SpO2 95%   BMI 18.95 kg/m   Physical Exam: Gen: Comfortably sleeping in bed, awakens to conversation and responds questions appropriately CVS: Pulse regular rhythm, normal rate, S1 and S2 normal Resp: Clear to auscultation bilaterally, no rales/rhonchi Abd: Soft, flat, nontender, bowel sounds normal Ext: No lower extremity edema, intact dressings over left upper arm AV graft that has a palpable thrill.  Labs: BMET Recent Labs   Lab 03/18/24 0532 03/22/24 0346 03/23/24 0231  NA 136 135 132*  K 5.2* 5.0 3.7  CL 99 98 94*  CO2 21* 25 27  GLUCOSE 79 99 158*  BUN 77* 47* 23  CREATININE 11.31* 7.86* 5.10*  CALCIUM  8.8* 8.8* 8.3*  PHOS  --   --  3.8   CBC Recent Labs  Lab 03/18/24 0532 03/22/24 0346 03/23/24 0231  WBC 4.0 5.1 4.9  NEUTROABS 2.4 3.2 3.3  HGB 10.7* 10.7* 10.2*  HCT 33.4* 34.2* 31.0*  MCV 96.0 98.6 93.4  PLT 210 218 237     Medications:     amLODipine   10 mg Oral Daily   atorvastatin   40 mg Oral QHS   Chlorhexidine  Gluconate Cloth  6 each Topical Q0600   clopidogrel   75 mg Oral Daily   heparin   5,000 Units Subcutaneous Q8H   hydrALAZINE   100 mg Oral Q8H   hydrALAZINE   50 mg Oral STAT   insulin  aspart  0-6 Units Subcutaneous TID AC & HS   losartan   100 mg Oral Daily   sodium chloride  flush  3 mL Intravenous Q12H   spironolactone   25 mg Oral Daily   Gordy Blanch, MD 03/23/2024, 8:06 AM

## 2024-03-23 NOTE — Evaluation (Signed)
 Speech Language Pathology Evaluation Patient Details Name: Shawn Collins MRN: [id number] DOB: 1951/05/21 Today's Date: 03/23/2024 Time: 8282-8265 SLP Time Calculation (min) (ACUTE ONLY): 17 min  Problem List:  Patient Active Problem List   Diagnosis Date Noted   Hypertensive emergency 03/23/2024   Cognitive impairment 03/22/2024   Dry gangrene (HCC) 03/22/2024   HTN (hypertension) 09/13/2023   COVID-19 07/10/2023   Hyperkalemia 07/10/2023   Metabolic acidosis with increased anion gap and accumulation of organic acids 07/10/2023   Hypertensive urgency 07/10/2023   Hypoglycemia 07/10/2023   Weakness 07/10/2023   Syncope 07/05/2022   Syncope and collapse 07/04/2022   Type 2 diabetes mellitus (HCC) 07/04/2022   Anemia of chronic disease 07/04/2022   Acute metabolic encephalopathy due to hypoglycemia 12/30/2021   Hypothermia 12/30/2021   Uncontrolled type 2 diabetes mellitus with hypoglycemia, with long-term current use of insulin  (HCC) 12/30/2021   History of CVA (cerebrovascular accident) 12/30/2021   DNR (do not resuscitate) 12/30/2021   Stroke determined by clinical assessment (HCC) 12/18/2021   ESRD (end stage renal disease) on dialysis (HCC) 02/23/2021   DOE (dyspnea on exertion) 11/22/2017   Hyperlipidemia LDL goal <70 04/17/2017   CAD (coronary artery disease)    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 09/10/2014   Diabetes mellitus without complication (HCC) 09/10/2014   Hypertension 09/10/2014   Emphysema lung (HCC) 09/10/2014   Past Medical History:  Past Medical History:   Diagnosis Date   CAD (coronary artery disease), native coronary artery    s/p NSTEMI with cath showing severe 3VD s/p  CABG x 3 with LIMA to LAD, SVG to OM1/OM2 and SVG D1 by Dr. Army on 10/26/16.   COPD (chronic obstructive pulmonary disease) (HCC) 09/10/2014   CVA (cerebral vascular accident) (HCC) 12/18/2021   Diabetes mellitus without complication (HCC)    Type II   ESRD (end stage renal disease) (HCC)    Hyperlipidemia LDL goal <70 04/17/2017   Hypertension    Past Surgical History:  Past Surgical History:  Procedure Laterality Date   A/V SHUNT INTERVENTION N/A 08/17/2023   Procedure: A/V SHUNT INTERVENTION;  Surgeon: Norine Manuelita LABOR, MD;  Location: [facility] INVASIVE CV LAB;  Service: Cardiovascular;  Laterality: N/A;   AV FISTULA PLACEMENT Left 12/21/2021   Procedure: INSERTION OF LEFT ARM ARTERIOVENOUS (AV) GORE-TEX GRAFT;  Surgeon: Eliza Lonni RAMAN, MD;  Location: [facility] OR;  Service: Vascular;  Laterality: Left;   CORONARY ARTERY BYPASS GRAFT N/A 10/26/2016   Procedure: CORONARY ARTERY BYPASS GRAFTING (CABG) x 4 USING LEFT INTERNAL MAMMARY ARTERY TO LAD AND ENDOSCOPICALLY HARVESTED GREATER SAPHENOUS VEIN TO OM 1, 2 AND TO DIAG;  Surgeon: Army Dallas NOVAK, MD;  Location: [facility] OR;  Service: Open Heart Surgery;  Laterality: N/A;   ENDOVEIN HARVEST OF GREATER SAPHENOUS VEIN Right 10/26/2016   Procedure: ENDOVEIN HARVEST OF GREATER SAPHENOUS VEIN;  Surgeon: Army Dallas NOVAK, MD;  Location: [facility] OR;  Service: Open Heart Surgery;  Laterality: Right;   IR THORACENTESIS ASP PLEURAL SPACE W/IMG GUIDE  11/03/2016   LEFT HEART CATH AND CORONARY ANGIOGRAPHY N/A 10/21/2016   Procedure: Left Heart Cath and  Coronary Angiography;  Surgeon: Jordan, Peter M, MD;  Location: [facility] INVASIVE CV LAB;  Service: Cardiovascular;  Laterality: N/A;   TEE WITHOUT CARDIOVERSION N/A 10/26/2016   Procedure: TRANSESOPHAGEAL ECHOCARDIOGRAM (TEE);  Surgeon: Army Dallas NOVAK, MD;  Location: [facility] OR;  Service: Open Heart  Surgery;  Laterality: N/A;   HPI:  Shawn Collins is a 73 y.o. male admitted with hypertensive urgency/emergency, intermittent nonadherence with dialysis and antihypertensives, who presented due to uncontrolled hypertension.  Hx of ESRD on MWF HD, CAD s/p CABG, CVA, COPD, hypertension, hyperlipidemia, recent frequent   Assessment / Plan / Recommendation Clinical Impression   Pt presents with a moderate cognitive impairment, patient would benefit from continued inpatient follow up therapy, <3 hours/day post acute stay if pt cannot find 24 hour supervision post discharge.   Pt was seen for skilled ST evaluation for cognitive-linguistic evaluation. The pt was given the [facility]  Mental Status Exam (SLUMS), where he scored a 12/30 on the SLUMS which is well below the norm and may be indicative of dementia. His areas of strength were orientation (however he did require delayed processing time), visual discrimination, and simple attention. His areas of weaknesses were: immediate verbal recall, delayed verbal recall, immediate story recall, executive function (organization), and numeric calculation. He appeared to have poor insight into his deficits, claiming that he isn't very worried about taking care of himself or taking all his pills. Given he does not have support going home and lives alone, recommend patient will benefit from continued inpatient follow up therapy, <3 hours/day post acute stay. Additionally, he may benefit transitioning to assisted living.   SLP Assessment  SLP Recommendation/Assessment: Patient needs continued Speech Language Pathology Services SLP Visit Diagnosis: Cognitive communication deficit (R41.841)     Assistance Recommended at Discharge  Frequent or constant Supervision/Assistance  Functional Status Assessment Patient has had a recent decline in their functional status and/or demonstrates limited ability to make significant improvements in function in a  reasonable and predictable amount of time  Frequency and Duration min 2x/week  1 week      SLP Evaluation Cognition  Overall Cognitive Status: Difficult to assess Arousal/Alertness: Lethargic Orientation Level: Oriented X4 (required delayed processing speed. I don't keep up with the date real good) Year: 2025 Day of Week: Correct Attention: Focused Focused Attention: Impaired Focused Attention Impairment: Verbal complex;Functional complex;Functional basic Memory: Impaired Memory Impairment: Storage deficit;Retrieval deficit;Decreased recall of new information Awareness: Impaired Awareness Impairment: Intellectual impairment;Emergent impairment;Anticipatory impairment Problem Solving: Impaired Problem Solving Impairment: Verbal complex;Functional complex Executive Function: Reasoning;Organizing Reasoning: Impaired Reasoning Impairment: Functional basic Organizing: Impaired Organizing Impairment: Verbal complex;Functional basic Safety/Judgment: Impaired       Comprehension  Auditory Comprehension Overall Auditory Comprehension: Appears within functional limits for tasks assessed Visual Recognition/Discrimination Discrimination: Within Function Limits    Expression Expression Primary Mode of Expression: Verbal Verbal Expression Overall Verbal Expression: Appears within functional limits for tasks assessed Written Expression Dominant Hand: Right   Oral / Motor  Oral Motor/Sensory Function Overall Oral Motor/Sensory Function: Within functional limits Motor Speech Overall Motor Speech: Appears within functional limits for tasks assessed            Manuelita Blew M.S. CCC-SLP

## 2024-03-23 NOTE — Care Management Obs Status (Signed)
 MEDICARE OBSERVATION STATUS NOTIFICATION   Patient Details  Name: Shawn Collins MRN: [id number] Date of Birth: 1950-09-04   Medicare Observation Status Notification Given:  Yes    Robynn Eileen Hoose, RN 03/23/2024, 2:52 PM

## 2024-03-23 NOTE — Evaluation (Signed)
 Physical Therapy Evaluation Patient Details Name: Shawn Collins MRN: [id number] DOB: [birth year] Today's Date: 03/23/2024  History of Present Illness  Shawn Collins is a 73 y.o. male admitted with hypertensive urgency/emergency, intermittent nonadherence with dialysis and antihypertensives, who presented due to uncontrolled hypertension.  Hx of ESRD on MWF HD, CAD s/p CABG, CVA, COPD, hypertension, hyperlipidemia, recent frequent  Clinical Impression  Pt admitted with above diagnosis. Pt was able to ambulate with RW with CGA in hallway. Pt usually using cane PTA and definite need of RW for balance. Pt has been forgetting to take his medication and this is causing his medical issues. Pt will benefit from post acute rehab < 3 hours day with transition to A living.  Will follow acutely. Pt currently with functional limitations due to the deficits listed below (see PT Problem List). Pt will benefit from acute skilled PT to increase their independence and safety with mobility to allow discharge.           If plan is discharge home, recommend the following: Assistance with cooking/housework;Assist for transportation;Supervision due to cognitive status;Help with stairs or ramp for entrance   Can travel by private vehicle   Yes    Equipment Recommendations None recommended by PT  Recommendations for Other Services       Functional Status Assessment Patient has had a recent decline in their functional status and demonstrates the ability to make significant improvements in function in a reasonable and predictable amount of time.     Precautions / Restrictions Precautions Precautions: Fall Restrictions Weight Bearing Restrictions Per Provider Order: No      Mobility  Bed Mobility Overal bed mobility: Needs Assistance Bed Mobility: Sit to Supine       Sit to supine: Supervision        Transfers Overall transfer level: Needs assistance Equipment used: Rolling walker (2  wheels) Transfers: Sit to/from Stand, Bed to chair/wheelchair/BSC Sit to Stand: Min assist           General transfer comment: Pt needed steadying assist to power up. once up can balance with RW however used cane PTA    Ambulation/Gait Ambulation/Gait assistance: Contact guard assist Gait Distance (Feet): 300 Feet Assistive device: Rolling walker (2 wheels) Gait Pattern/deviations: Step-through pattern, Decreased stride length, Trunk flexed   Gait velocity interpretation: <1.31 ft/sec, indicative of household ambulator   General Gait Details: Pt was able to progress ambulation to hallway with RW with overall good safety. Pt used cane PTA and would like to do the same at home. At present only safe with RW use/bil UE support.  Stairs            Wheelchair Mobility     Tilt Bed    Modified Rankin (Stroke Patients Only)       Balance Overall balance assessment: Needs assistance Sitting-balance support: Feet supported Sitting balance-Leahy Scale: Fair     Standing balance support: No upper extremity supported, During functional activity, Bilateral upper extremity supported Standing balance-Leahy Scale: Poor Standing balance comment: Posterior lean and LOB with initial standing from EOB without use of assistive device.  Min guard for balance once ambulating with the RW.                             Pertinent Vitals/Pain Pain Assessment Pain Assessment: No/denies pain    Home Living Family/patient expects to be discharged to:: Private residence Living Arrangements: Alone Available Help at Discharge: Family Type  of Home: House Home Access: Ramped entrance       Home Layout: Two level;1/2 bath on main level;Other (Comment) (Has to go up to the second floor for a shower) Home Equipment: Cane - single point;Rolling Walker (2 wheels) Additional Comments: States he can drive but his car isn't working right now.  He states he has someone that picks him up  occassionaly to go to dialysis or he will catch the bus.    Prior Function Prior Level of Function : Independent/Modified Independent             Mobility Comments: Used cane sometimes for mobility but would forget it. ADLs Comments: Reports that he completes his own shower and gets dressed.  He has to go up to the second level for showers     Extremity/Trunk Assessment   Upper Extremity Assessment Upper Extremity Assessment: Defer to OT evaluation    Lower Extremity Assessment Lower Extremity Assessment: Overall WFL for tasks assessed    Cervical / Trunk Assessment Cervical / Trunk Assessment: Kyphotic  Communication   Communication Communication: No apparent difficulties Factors Affecting Communication: Reduced clarity of speech    Cognition Arousal: Alert Behavior During Therapy: WFL for tasks assessed/performed   PT - Cognitive impairments: History of cognitive impairments                         Following commands: Intact       Cueing Cueing Techniques: Verbal cues     General Comments General comments (skin integrity, edema, etc.): VSS    Exercises     Assessment/Plan    PT Assessment Patient needs continued PT services  PT Problem List Decreased activity tolerance;Decreased balance;Decreased mobility;Decreased knowledge of use of DME;Decreased safety awareness;Decreased knowledge of precautions       PT Treatment Interventions DME instruction;Gait training;Functional mobility training;Therapeutic activities;Therapeutic exercise;Balance training;Patient/family education    PT Goals (Current goals can be found in the Care Plan section)  Acute Rehab PT Goals Patient Stated Goal: to be able to take his medications PT Goal Formulation: With patient Time For Goal Achievement: 04/06/24 Potential to Achieve Goals: Good    Frequency Min 2X/week     Co-evaluation               AM-PAC PT 6 Clicks Mobility  Outcome Measure Help  needed turning from your back to your side while in a flat bed without using bedrails?: A Little Help needed moving from lying on your back to sitting on the side of a flat bed without using bedrails?: A Little Help needed moving to and from a bed to a chair (including a wheelchair)?: A Little Help needed standing up from a chair using your arms (e.g., wheelchair or bedside chair)?: A Little Help needed to walk in hospital room?: A Lot Help needed climbing 3-5 steps with a railing? : Total 6 Click Score: 15    End of Session Equipment Utilized During Treatment: Gait belt Activity Tolerance: Patient limited by fatigue Patient left: in bed;with call bell/phone within reach;with bed alarm set Nurse Communication: Mobility status PT Visit Diagnosis: Muscle weakness (generalized) (M62.81)    Time: 8641-8585 PT Time Calculation (min) (ACUTE ONLY): 16 min   Charges:   PT Evaluation $PT Eval Moderate Complexity: 1 Mod   PT General Charges $$ ACUTE PT VISIT: 1 Visit         Denver Harder M,PT Acute Rehab Services [phone]   Stephane JULIANNA Bevel 03/23/2024, 3:40  PM

## 2024-03-23 NOTE — Progress Notes (Signed)
 Patient's blood sugar was 69 this a.m. Given 8 oz of juice.

## 2024-03-23 NOTE — Evaluation (Signed)
 Occupational Therapy Evaluation Patient Details Name: Shawn Collins MRN: [id number] DOB: [birth year] Today's Date: 03/23/2024   History of Present Illness   Shawn Collins is a 73 y.o. male admitted with hypertensive urgency/emergency, intermittent nonadherence with dialysis and antihypertensives, who presented due to uncontrolled hypertension.  Hx of ESRD on MWF HD, CAD s/p CABG, CVA, COPD, hypertension, hyperlipidemia, recent frequent     Clinical Impressions Pt currently at min assist level for selfcare tasks and toileting with use of the RW for support.  Memory deficits noted and acknowledged by patient with 0/3 words recalled after 5 min delay.  He was able to state place, month, day of the week however.  Prior to admission, pt lived alone and had occasional assist from friends and family however not specific when asked.  Feel he will benefit from acute care OT at this time in order to increase balance, safety, and independence with basic selfcare tasks.  Recommend patient will benefit from continued inpatient follow up therapy, <3 hours/day post acute stay as pt does not have 24 hour supervision post discharge.         If plan is discharge home, recommend the following:   A little help with walking and/or transfers;A little help with bathing/dressing/bathroom;Assistance with cooking/housework;Assist for transportation;Help with stairs or ramp for entrance;Direct supervision/assist for medications management;Supervision due to cognitive status     Functional Status Assessment   Patient has had a recent decline in their functional status and demonstrates the ability to make significant improvements in function in a reasonable and predictable amount of time.     Equipment Recommendations   Other (comment) (TBD next venue of care)      Precautions/Restrictions   Precautions Precautions: Fall Restrictions Weight Bearing Restrictions Per Provider Order: No     Mobility Bed  Mobility Overal bed mobility: Needs Assistance Bed Mobility: Supine to Sit     Supine to sit: Contact guard          Transfers Overall transfer level: Needs assistance Equipment used: Rolling walker (2 wheels) Transfers: Sit to/from Stand, Bed to chair/wheelchair/BSC Sit to Stand: Min assist     Step pivot transfers: Min assist            Balance Overall balance assessment: Needs assistance Sitting-balance support: Feet supported Sitting balance-Leahy Scale: Fair     Standing balance support: No upper extremity supported, During functional activity Standing balance-Leahy Scale: Poor Standing balance comment: Posterior lean and LOB with initial standing from EOB without use of assistive device.  Min guard for balance once ambulating with the RW.                           ADL either performed or assessed with clinical judgement   ADL Overall ADL's : Needs assistance/impaired Eating/Feeding: Independent;Sitting   Grooming: Wash/dry hands;Wash/dry face;Contact guard assist;Standing   Upper Body Bathing: Set up;Sitting   Lower Body Bathing: Minimal assistance;Sit to/from stand   Upper Body Dressing : Set up;Sitting   Lower Body Dressing: Minimal assistance;Sit to/from stand   Toilet Transfer: Minimal assistance;Ambulation;Rolling walker (2 wheels);Regular Toilet;Grab bars   Toileting- Clothing Manipulation and Hygiene: Minimal assistance;Sit to/from stand       Functional mobility during ADLs: Minimal assistance;Rolling walker (2 wheels) General ADL Comments: Pt with increased posterior lean initially when standing from bed.  This improved with longer standing and integration of RW.  BP in supine at 192/79 and then in sitting at 158/71.  His HR maintained at around 70 BPM in sitting.     Vision Baseline Vision/History: 1 Wears glasses (pt reports visual trouble with his right eye but it is improving) Ability to See in Adequate Light: 1  Impaired Patient Visual Report: Nausea/blurring vision with head movement Vision Assessment?: Wears glasses for reading     Perception Perception: Within Functional Limits       Praxis Praxis: WFL       Pertinent Vitals/Pain Pain Assessment Pain Assessment: Faces Pain Score: 0-No pain     Extremity/Trunk Assessment Upper Extremity Assessment Upper Extremity Assessment: Overall WFL for tasks assessed   Lower Extremity Assessment Lower Extremity Assessment: Defer to PT evaluation   Cervical / Trunk Assessment Cervical / Trunk Assessment: Kyphotic   Communication Communication Communication: No apparent difficulties   Cognition Arousal: Alert Behavior During Therapy: WFL for tasks assessed/performed Cognition: Cognition impaired   Orientation impairments: Situation Awareness: Online awareness impaired, Intellectual awareness intact Memory impairment (select all impairments): Short-term memory Attention impairment (select first level of impairment): Sustained attention Executive functioning impairment (select all impairments): Organization, Sequencing, Problem solving OT - Cognition Comments: Pt unable to recall any of 3 words given after approximately 5 min delay, even when cues were given.                 Following commands: Intact       Cueing  General Comments   Cueing Techniques: Verbal cues              Home Living Family/patient expects to be discharged to:: Private residence Living Arrangements: Alone   Type of Home: House Home Access: Ramped entrance     Home Layout: Two level;1/2 bath on main level;Other (Comment) (Has to go up to the second floor for a shower)     Bathroom Shower/Tub: Producer, Television/film/video: Standard     Home Equipment: Cane - single Librarian, Academic (2 wheels)   Additional Comments: States he can drive but his car isn't working right now.  He states he has someone that picks him up occassionaly to  go to dialysis or he will catch the bus.      Prior Functioning/Environment Prior Level of Function : Independent/Modified Independent             Mobility Comments: Used cane sometimes for mobility but would forget it. ADLs Comments: Reports that he completes his own shower and gets dressed.  He has to go up to the second level for showers    OT Problem List: Decreased strength;Decreased knowledge of use of DME or AE;Impaired balance (sitting and/or standing)   OT Treatment/Interventions: Self-care/ADL training;Patient/family education;Balance training;Therapeutic activities;DME and/or AE instruction;Cognitive remediation/compensation      OT Goals(Current goals can be found in the care plan section)   Acute Rehab OT Goals Patient Stated Goal: Pt wants to walk. OT Goal Formulation: With patient Time For Goal Achievement: 04/06/24 Potential to Achieve Goals: Good   OT Frequency:  Min 2X/week       AM-PAC OT 6 Clicks Daily Activity     Outcome Measure Help from another person eating meals?: None Help from another person taking care of personal grooming?: A Little Help from another person toileting, which includes using toliet, bedpan, or urinal?: A Little Help from another person bathing (including washing, rinsing, drying)?: A Little Help from another person to put on and taking off regular upper body clothing?: A Little Help from another person to put on and  taking off regular lower body clothing?: A Little 6 Click Score: 19   End of Session Equipment Utilized During Treatment: Gait belt;Rollator (4 wheels) Nurse Communication: Mobility status  Activity Tolerance: Patient tolerated treatment well Patient left: in chair;with call bell/phone within reach;with chair alarm set  OT Visit Diagnosis: Unsteadiness on feet (R26.81);Other abnormalities of gait and mobility (R26.89);Repeated falls (R29.6);Muscle weakness (generalized) (M62.81);Other symptoms and signs  involving cognitive function                Time: 1026-1110 OT Time Calculation (min): 44 min Charges:  OT General Charges $OT Visit: 1 Visit OT Evaluation $OT Eval Moderate Complexity: 1 Mod OT Treatments $Self Care/Home Management : 23-37 mins  Lynwood Constant, OTR/L Acute Rehabilitation Services  Office [phone] 03/23/2024

## 2024-03-23 NOTE — Progress Notes (Signed)
 Progress Note    Shawn Collins   FMW:[id number]  DOB: [birth year]  DOA: 03/22/2024     0 PCP: Shelda Atlas, MD  Initial CC: elevated BP  Hospital Course: Shawn Collins is a 73 yo male with PMH ESRD on HD, CAD, COPD, CVA, DM II, HTN, HLD who presented to the ER via EMS with elevated blood pressure. He has had 7 ER visits over the past 2 months all for uncontrolled blood pressure. He has been forgetful at home with possibly not taking medications.  There has been concern that he may not be able to continue living safely at home due to cognitive impairment. Presentation this time similar.  He thought he was checking glucose levels when in fact his blood pressure was found to be uncontrolled. He states he did go to dialysis this past Wednesday. HD center has also contacted the hospital due to concerns for patient returning safely at home.  He was admitted for further blood pressure control and ongoing dialysis during hospitalization.  PT, OT consulted as well as speech for cognitive evaluation.  Interval History:  No events overnight.  Resting comfortably when seen.  When we talked, he was amenable with consideration of alternative living arrangements however still had obvious difficulty really understanding the conversation further concerning for sending him home.  Assessment and Plan: * Hypertensive emergency-resolved as of 03/22/2024 - Recurrent ED visits with severe range hypertension, chronic associated myocardial injury. Has predominantly systolic hypertension. Reports checking BP at home and reading high, no associated symptoms.  - On initial eval BP max 255/90, MAP 146. HS trop 58 -> 48, EKG without acute ischemic changes. CT head no acute findings.  - Continue home regimen along with PRN meds - increasing hydralazine  10/25 - Fill history reviewed and appears appropriate  Cognitive impairment - patient oriented in ER, but cognition certainly slowed; unable to really explain  reasoning for why he misses meds at home - HD center has also reached out with concerns about patient returning home safely and continuing to care for himself - I briefly brought up assisted living with patient this morning and he was amenable however not sure financial situation or if this has been evaluated in the past -SLP consulted for cognitive assessment - TOC consulted for placement of possible.  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  -- Screening ABI, should establish with vascular surgery as outpatient   COPD (chronic obstructive pulmonary disease) (HCC) - Continue breathing treatments  CAD (coronary artery disease) - s/p CABG 2018 - continue plavix , ARB, and statin  Type 2 diabetes mellitus (HCC) - A1c 5% - SSI  History of CVA (cerebrovascular accident) - continue statin and plavix    Hyperlipidemia LDL goal <70 - continue statin   Antimicrobials:   DVT prophylaxis:  heparin  injection 5,000 Units Start: 03/22/24 1400   Code Status:   Code Status: Do not attempt resuscitation (DNR) PRE-ARREST INTERVENTIONS DESIRED  Mobility Assessment (Last 72 Hours)     Mobility Assessment     Row Name 03/23/24 1122 03/23/24 0800 03/22/24 2000 03/22/24 1834     Does the patient have exclusion criteria? -- No - Perform mobility assessment No - Perform mobility  assessment No - Perform mobility assessment    What is the highest level of mobility based on the mobility assessment? Level 4 (Ambulates with assistance) - Balance while stepping forward/back - Complete Level 4 (Ambulates with assistance) - Balance while stepping  forward/back - Complete Level 2 (Chairfast) - Balance while sitting on edge of bed and cannot stand Level 2 (Chairfast) - Balance while sitting on edge of bed and cannot stand    Is the above level different from baseline mobility prior to current illness? -- Yes - Recommend PT order Yes - Recommend PT order Yes - Recommend PT order       Barriers to discharge: Potential disposition issues Disposition Plan: TBD HH orders placed: N/A Status is: Observation  Objective: Blood pressure (!) 184/76, pulse 67, temperature 97.9 F (36.6 C), temperature source Oral, resp. rate 18, height 5' 10 (1.778 m), weight 59.9 kg, SpO2 98%.  Examination:  Physical Exam Constitutional:      General: He is not in acute distress.    Appearance: Normal appearance.  HENT:     Head: Normocephalic and atraumatic.     Mouth/Throat:     Mouth: Mucous membranes are moist.  Eyes:     Extraocular Movements: Extraocular movements intact.  Cardiovascular:     Rate and Rhythm: Normal rate and regular rhythm.  Pulmonary:     Effort: Pulmonary effort is normal. No respiratory distress.     Breath sounds: Normal breath sounds. No wheezing.  Abdominal:     General: Bowel sounds are normal. There is no distension.     Palpations: Abdomen is soft.     Tenderness: There is no abdominal tenderness.  Musculoskeletal:        General: Normal range of motion.     Cervical back: Normal range of motion and neck supple.  Skin:    General: Skin is warm and dry.  Neurological:     General: No focal deficit present.     Mental Status: He is alert and oriented to person, place, and time.  Psychiatric:        Mood and Affect: Mood normal.      Consultants:  Nephrology  Procedures:    Data Reviewed: Results for orders placed or performed during the hospital encounter of 03/22/24 (from the past 24 hours)  Hemoglobin A1c     Status: None   Collection Time: 03/22/24  6:47 PM  Result Value Ref Range   Hgb A1c MFr Bld  5.0 4.8 - 5.6 %   Mean Plasma Glucose 96.8 mg/dL  MRSA Next Gen by PCR, Nasal     Status: None   Collection Time: 03/22/24  6:55 PM   Specimen: Nasal Mucosa; Nasal Swab  Result Value Ref Range   MRSA by PCR Next Gen NOT DETECTED NOT DETECTED  Glucose, capillary     Status: Abnormal   Collection Time: 03/22/24  9:13 PM  Result Value Ref Range   Glucose-Capillary 191 (H) 70 - 99 mg/dL  CBC with Differential/Platelet     Status: Abnormal   Collection Time: 03/23/24  2:31 AM  Result Value Ref Range   WBC 4.9 4.0 - 10.5 K/uL   RBC 3.32 (L) 4.22 - 5.81 MIL/uL   Hemoglobin 10.2 (L) 13.0 - 17.0 g/dL   HCT 68.9 (L) 60.9 - 47.9 %   MCV 93.4 80.0 - 100.0 fL   MCH 30.7 26.0 - 34.0 pg   MCHC 32.9 30.0 - 36.0 g/dL   RDW 13.9  11.5 - 15.5 %   Platelets 237 150 - 400 K/uL   nRBC 0.0 0.0 - 0.2 %   Neutrophils Relative % 65 %   Neutro Abs 3.3 1.7 - 7.7 K/uL   Lymphocytes Relative 16 %   Lymphs Abs 0.8 0.7 - 4.0 K/uL   Monocytes Relative 14 %   Monocytes Absolute 0.7 0.1 - 1.0 K/uL   Eosinophils Relative 2 %   Eosinophils Absolute 0.1 0.0 - 0.5 K/uL   Basophils Relative 2 %   Basophils Absolute 0.1 0.0 - 0.1 K/uL   Immature Granulocytes 1 %   Abs Immature Granulocytes 0.04 0.00 - 0.07 K/uL  Magnesium      Status: None   Collection Time: 03/23/24  2:31 AM  Result Value Ref Range   Magnesium  1.8 1.7 - 2.4 mg/dL  Renal function panel     Status: Abnormal   Collection Time: 03/23/24  2:31 AM  Result Value Ref Range   Sodium 132 (L) 135 - 145 mmol/L   Potassium 3.7 3.5 - 5.1 mmol/L   Chloride 94 (L) 98 - 111 mmol/L   CO2 27 22 - 32 mmol/L   Glucose, Bld 158 (H) 70 - 99 mg/dL   BUN 23 8 - 23 mg/dL   Creatinine, Ser 4.89 (H) 0.61 - 1.24 mg/dL   Calcium  8.3 (L) 8.9 - 10.3 mg/dL   Phosphorus 3.8 2.5 - 4.6 mg/dL   Albumin  3.0 (L) 3.5 - 5.0 g/dL   GFR, Estimated 11 (L) >60 mL/min   Anion gap 11 5 - 15  Glucose, capillary     Status: Abnormal   Collection Time: 03/23/24  6:31 AM  Result Value  Ref Range   Glucose-Capillary 69 (L) 70 - 99 mg/dL  Glucose, capillary     Status: None   Collection Time: 03/23/24  7:11 AM  Result Value Ref Range   Glucose-Capillary 91 70 - 99 mg/dL  Glucose, capillary     Status: Abnormal   Collection Time: 03/23/24 11:15 AM  Result Value Ref Range   Glucose-Capillary 144 (H) 70 - 99 mg/dL    I have reviewed pertinent nursing notes, vitals, labs, and images as necessary. I have ordered labwork to follow up on as indicated.  I have reviewed the last notes from staff over past 24 hours. I have discussed patient's care plan and test results with nursing staff, CM/SW, and other staff as appropriate.  Old records reviewed in assessment of this patient  Time spent: Greater than 50% of the 55 minute visit was spent in counseling/coordination of care for the patient as laid out in the A&P.   LOS: 0 days   Alm Apo, MD [facility] 03/23/2024, 3:08 PM

## 2024-03-24 DIAGNOSIS — Z992 Dependence on renal dialysis: Secondary | ICD-10-CM | POA: Diagnosis not present

## 2024-03-24 DIAGNOSIS — I161 Hypertensive emergency: Secondary | ICD-10-CM | POA: Diagnosis not present

## 2024-03-24 DIAGNOSIS — R4189 Other symptoms and signs involving cognitive functions and awareness: Secondary | ICD-10-CM | POA: Diagnosis not present

## 2024-03-24 DIAGNOSIS — N186 End stage renal disease: Secondary | ICD-10-CM | POA: Diagnosis not present

## 2024-03-24 LAB — GLUCOSE, CAPILLARY
Glucose-Capillary: 99 mg/dL (ref 70–99)
Glucose-Capillary: 296 mg/dL — ABNORMAL HIGH (ref 70–99)
Glucose-Capillary: 111 mg/dL — ABNORMAL HIGH (ref 70–99)
Glucose-Capillary: 165 mg/dL — ABNORMAL HIGH (ref 70–99)

## 2024-03-24 NOTE — Progress Notes (Signed)
 Patient ID: Shawn Collins, male   DOB: Dec 26, 1950, 73 y.o.   MRN: [id number] [facility] Progress Note   Assessment/ Plan:   1.  Hypertensive urgency: With malignant hypertension upon presentation secondary to nonadherence with oral antihypertensive therapy.  Blood pressures trending down with regular/observed administration of antihypertensive therapy.  Ongoing efforts for safe placement upon discharge. 2. ESRD: Typically on MWF dialysis schedule and will order for his next hemodialysis again tomorrow. 3. Anemia: Hemoglobin and hematocrit level currently within acceptable range.  No overt blood loss and will defer ESA therapy at this time. 4. CKD-MBD: Corrected calcium  level and phosphorus level are both at goal, continue current management with renal diet. 5.  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 6.  Cognitive impairment: Difficult to assess cognitive function per SLP note but reassuring to see that the patient acknowledges that he has difficulties with recall/taking his medications as prescribed.  When seen this morning, he is amenable to assistance with this-we discussed options including ALF and SNF.  Subjective:   Expresses concern that he has been messing up how he takes his medications and understands that he needs extra help to prevent this.   Objective:   BP (!) 182/77   Pulse 62   Temp 98.6 F (37 C) (Oral)   Resp 18   Ht 5' 10 (1.778 m)   Wt 57.2 kg   SpO2 93%   BMI 18.09 kg/m   Physical Exam: Gen: Resting comfortably in bed, awake and engages appropriately in conversation CVS: Pulse regular rhythm, normal rate, S1 and S2 normal Resp: Clear to auscultation bilaterally, no rales/rhonchi Abd: Soft, flat, nontender, bowel sounds normal Ext: No lower extremity edema, intact dressings over left upper arm AV graft that has a palpable  thrill.  Labs: BMET Recent Labs  Lab 03/18/24 0532 03/22/24 0346 03/23/24 0231  NA 136 135 132*  K 5.2* 5.0 3.7  CL 99 98 94*  CO2 21* 25 27  GLUCOSE 79 99 158*  BUN 77* 47* 23  CREATININE 11.31* 7.86* 5.10*  CALCIUM  8.8* 8.8* 8.3*  PHOS  --   --  3.8   CBC Recent Labs  Lab 03/18/24 0532 03/22/24 0346 03/23/24 0231  WBC 4.0 5.1 4.9  NEUTROABS 2.4 3.2 3.3  HGB 10.7* 10.7* 10.2*  HCT 33.4* 34.2* 31.0*  MCV 96.0 98.6 93.4  PLT 210 218 237     Medications:     amLODipine   10 mg Oral Daily   atorvastatin   40 mg Oral QHS   Chlorhexidine  Gluconate Cloth  6 each Topical Q0600   clopidogrel   75 mg Oral Daily   heparin   5,000 Units Subcutaneous Q8H   hydrALAZINE   100 mg Oral Q8H   insulin  aspart  0-6 Units Subcutaneous TID AC & HS   losartan   100 mg Oral Daily   sodium chloride  flush  3 mL Intravenous Q12H   spironolactone   25 mg Oral Daily   Gordy Blanch, MD 03/24/2024, 7:07 AM

## 2024-03-24 NOTE — Plan of Care (Signed)
  Problem: Education: Goal: Individualized Educational Video(s) Outcome: Progressing   Problem: Coping: Goal: Ability to adjust to condition or change in health will improve Outcome: Progressing   Problem: Fluid Volume: Goal: Ability to maintain a balanced intake and output will improve Outcome: Progressing

## 2024-03-24 NOTE — Progress Notes (Signed)
 Progress Note    Shawn Collins   FMW:[id number]  DOB: 12-May-1951  DOA: 03/22/2024     1 PCP: Shelda Atlas, MD  Initial CC: elevated BP  Hospital Course: Shawn Collins is a 73 yo male with PMH ESRD on HD, CAD, COPD, CVA, DM II, HTN, HLD who presented to the ER via EMS with elevated blood pressure. He has had 7 ER visits over the past 2 months all for uncontrolled blood pressure. He has been forgetful at home with possibly not taking medications.  There has been concern that he may not be able to continue living safely at home due to cognitive impairment. Presentation this time similar.  He thought he was checking glucose levels when in fact his blood pressure was found to be uncontrolled. He states he did go to dialysis this past Wednesday. HD center has also contacted the hospital due to concerns for patient returning safely at home.  He was admitted for further blood pressure control and ongoing dialysis during hospitalization.  PT, OT consulted as well as speech for cognitive evaluation.  Interval History:  No events overnight. Confused still but at least admits he can tell he's confused too. We're still trying to pursue SNF if able.  Next HD due tomorrow.  Assessment and Plan: * Hypertensive emergency-resolved as of 03/22/2024 - Recurrent ED visits with severe range hypertension, chronic associated myocardial injury. Has predominantly systolic hypertension. Reports checking BP at home and reading high, no associated symptoms.  - On initial eval BP max 255/90, MAP 146. HS trop 58 -> 48, EKG without acute ischemic changes. CT head no acute findings.  - Continue home regimen along with PRN meds - increasing hydralazine  10/25 - Fill history reviewed and appears appropriate  Cognitive impairment - patient oriented in ER, but cognition certainly slowed; unable to really explain reasoning for why he misses meds at home - HD center has also reached out with concerns about patient  returning home safely and continuing to care for himself - I briefly brought up assisted living with patient this morning and he was amenable however not sure financial situation or if this has been evaluated in the past -SLP consulted for cognitive assessment - TOC consulted for placement of possible.  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  -- Screening ABI, should establish with vascular surgery as outpatient   COPD (chronic obstructive pulmonary disease) (HCC) - Continue breathing treatments  CAD (coronary artery disease) - s/p CABG 2018 - continue plavix , ARB, and statin  Type 2 diabetes mellitus (HCC) - A1c 5% - SSI  History of CVA (cerebrovascular accident) - continue statin and plavix    Hyperlipidemia LDL goal <70 - continue statin   Antimicrobials:   DVT prophylaxis:  heparin  injection 5,000 Units Start: 03/22/24 1400   Code Status:   Code Status: Do not attempt resuscitation (DNR) PRE-ARREST INTERVENTIONS DESIRED  Mobility Assessment (Last 72 Hours)     Mobility Assessment     Row Name 03/24/24 0800 03/23/24 2000 03/23/24 1500 03/23/24 1122 03/23/24 0800   Does the patient have exclusion criteria? No - Perform mobility assessment No - Perform mobility assessment -- -- No - Perform mobility assessment  What is the highest level of mobility based on the mobility assessment? Level 4 (Ambulates with assistance) - Balance while stepping forward/back - Complete Level 4 (Ambulates with assistance) - Balance while stepping forward/back - Complete Level 4 (Ambulates with assistance) - Balance while stepping  forward/back - Complete Level 4 (Ambulates with assistance) - Balance while stepping forward/back - Complete Level 4 (Ambulates with assistance) - Balance while stepping forward/back - Complete   Is the above level different from baseline mobility prior to current illness? Yes - Recommend PT order Yes - Recommend PT order -- -- Yes - Recommend PT order    Row Name 03/22/24 2000 03/22/24 1834         Does the patient have exclusion criteria? No - Perform mobility assessment No - Perform mobility assessment      What is the highest level of mobility based on the mobility assessment? Level 2 (Chairfast) - Balance while sitting on edge of bed and cannot stand Level 2 (Chairfast) - Balance while sitting on edge of bed and cannot stand      Is the above level different from baseline mobility prior to current illness? Yes - Recommend PT order Yes - Recommend PT order         Barriers to discharge: Potential disposition issues Disposition Plan: TBD HH orders placed: N/A Status is: Observation  Objective: Blood pressure (!) 161/67, pulse 60, temperature 98.4 F (36.9 C), temperature source Oral, resp. rate 16, height 5' 10 (1.778 m), weight 57.2 kg, SpO2 95%.  Examination:  Physical Exam Constitutional:      General: He is not in acute distress.    Appearance: Normal appearance.  HENT:     Head: Normocephalic and atraumatic.     Mouth/Throat:     Mouth: Mucous membranes are moist.  Eyes:     Extraocular Movements: Extraocular movements intact.  Cardiovascular:     Rate and Rhythm: Normal rate and regular rhythm.  Pulmonary:     Effort: Pulmonary effort is normal. No respiratory distress.     Breath sounds: Normal breath sounds. No wheezing.  Abdominal:     General: Bowel sounds are normal. There is no distension.     Palpations: Abdomen is soft.     Tenderness: There is no abdominal tenderness.  Musculoskeletal:        General: Normal range of motion.     Cervical back: Normal  range of motion and neck supple.  Skin:    General: Skin is warm and dry.  Neurological:     General: No focal deficit present.     Mental Status: He is alert and oriented to person, place, and time.  Psychiatric:        Mood and Affect: Mood normal.      Consultants:  Nephrology  Procedures:    Data Reviewed: Results for orders placed or performed during the hospital encounter of 03/22/24 (from the past 24 hours)  Glucose, capillary     Status: Abnormal   Collection Time: 03/23/24  4:21 PM  Result Value Ref Range   Glucose-Capillary 187 (H) 70 - 99 mg/dL  Glucose, capillary     Status: Abnormal   Collection Time: 03/23/24  9:39 PM  Result Value Ref Range   Glucose-Capillary 196 (H) 70 - 99 mg/dL  Glucose, capillary     Status: None   Collection Time: 03/24/24  6:07 AM  Result Value Ref Range   Glucose-Capillary 99 70 - 99 mg/dL  I have reviewed pertinent nursing notes, vitals, labs, and images as necessary. I have ordered labwork to follow up on as indicated.  I have reviewed the last notes from staff over past 24 hours. I have discussed patient's care plan and test results with nursing staff, CM/SW, and other staff as appropriate.  Old records reviewed in assessment of this patient  Time spent: Greater than 50% of the 55 minute visit was spent in counseling/coordination of care for the patient as laid out in the A&P.   LOS: 1 day   Alm Apo, MD [facility] 03/24/2024, 11:24 AM

## 2024-03-24 NOTE — Plan of Care (Signed)
  Problem: Coping: Goal: Ability to adjust to condition or change in health will improve Outcome: Progressing   Problem: Fluid Volume: Goal: Ability to maintain a balanced intake and output will improve Outcome: Progressing   Problem: Education: Goal: Knowledge of General Education information will improve Description: Including pain rating scale, medication(s)/side effects and non-pharmacologic comfort measures Outcome: Progressing   Problem: Clinical Measurements: Goal: Diagnostic test results will improve Outcome: Progressing   Problem: Coping: Goal: Level of anxiety will decrease Outcome: Progressing

## 2024-03-25 ENCOUNTER — Ambulatory Visit: Admitting: Podiatry

## 2024-03-25 DIAGNOSIS — N186 End stage renal disease: Secondary | ICD-10-CM | POA: Diagnosis not present

## 2024-03-25 DIAGNOSIS — Z992 Dependence on renal dialysis: Secondary | ICD-10-CM | POA: Diagnosis not present

## 2024-03-25 DIAGNOSIS — I161 Hypertensive emergency: Secondary | ICD-10-CM | POA: Diagnosis not present

## 2024-03-25 LAB — CBC WITH DIFFERENTIAL/PLATELET
Abs Immature Granulocytes: 0.08 K/uL — ABNORMAL HIGH (ref 0.00–0.07)
Basophils Absolute: 0.1 K/uL (ref 0.0–0.1)
Basophils Relative: 1 %
Eosinophils Absolute: 0.1 K/uL (ref 0.0–0.5)
Eosinophils Relative: 2 %
HCT: 30.9 % — ABNORMAL LOW (ref 39.0–52.0)
Hemoglobin: 10.1 g/dL — ABNORMAL LOW (ref 13.0–17.0)
Immature Granulocytes: 1 %
Lymphocytes Relative: 14 %
Lymphs Abs: 1 K/uL (ref 0.7–4.0)
MCH: 31.1 pg (ref 26.0–34.0)
MCHC: 32.7 g/dL (ref 30.0–36.0)
MCV: 95.1 fL (ref 80.0–100.0)
Monocytes Absolute: 0.8 K/uL (ref 0.1–1.0)
Monocytes Relative: 12 %
Neutro Abs: 4.8 K/uL (ref 1.7–7.7)
Neutrophils Relative %: 70 %
Platelets: 261 K/uL (ref 150–400)
RBC: 3.25 MIL/uL — ABNORMAL LOW (ref 4.22–5.81)
RDW: 14.4 % (ref 11.5–15.5)
WBC: 6.8 K/uL (ref 4.0–10.5)
nRBC: 0.3 % — ABNORMAL HIGH (ref 0.0–0.2)

## 2024-03-25 LAB — RENAL FUNCTION PANEL
Albumin: 3 g/dL — ABNORMAL LOW (ref 3.5–5.0)
Anion gap: 14 (ref 5–15)
BUN: 45 mg/dL — ABNORMAL HIGH (ref 8–23)
CO2: 23 mmol/L (ref 22–32)
Calcium: 8.6 mg/dL — ABNORMAL LOW (ref 8.9–10.3)
Chloride: 90 mmol/L — ABNORMAL LOW (ref 98–111)
Creatinine, Ser: 8.83 mg/dL — ABNORMAL HIGH (ref 0.61–1.24)
GFR, Estimated: 6 mL/min — ABNORMAL LOW (ref >60)
Glucose, Bld: 86 mg/dL (ref 70–99)
Phosphorus: 5.2 mg/dL — ABNORMAL HIGH (ref 2.5–4.6)
Potassium: 5.1 mmol/L (ref 3.5–5.1)
Sodium: 127 mmol/L — ABNORMAL LOW (ref 135–145)

## 2024-03-25 LAB — GLUCOSE, CAPILLARY
Glucose-Capillary: 77 mg/dL (ref 70–99)
Glucose-Capillary: 100 mg/dL — ABNORMAL HIGH (ref 70–99)
Glucose-Capillary: 129 mg/dL — ABNORMAL HIGH (ref 70–99)
Glucose-Capillary: 153 mg/dL — ABNORMAL HIGH (ref 70–99)

## 2024-03-25 LAB — MAGNESIUM: Magnesium: 2 mg/dL (ref 1.7–2.4)

## 2024-03-25 MED ORDER — HEPARIN SODIUM (PORCINE) 1000 UNIT/ML IJ SOLN
INTRAMUSCULAR | Status: AC
Start: 2024-03-25 — End: 2024-03-25
  Filled 2024-03-25: qty 5

## 2024-03-25 MED ORDER — HEPARIN SODIUM (PORCINE) 1000 UNIT/ML DIALYSIS: 40.0000 [IU]/kg | INTRAMUSCULAR | Status: DC | PRN

## 2024-03-25 NOTE — Plan of Care (Signed)

## 2024-03-25 NOTE — TOC Initial Note (Signed)
 Transition of Care [facility]) - Initial/Assessment Note    Patient Details  Name: Shawn Collins MRN: [id number] Date of Birth: Aug 07, 1950  [facility]) CM/SW Contact:    Lauraine FORBES Saa, LCSWA Phone Number: 03/25/2024, 2:00 PM  Clinical Narrative:                  2:03 PM MD informed bedside RN and CSW that patient was agreeable with discharging to SNF. CSW sent patient's FL2 to SNFs in [facility]. Per chart review, patient resides at home alone. Patient has a PCP and insurance. Patient has SNF history with [facility] and [facility]. Patient has HH history with [facility]. Patient has DME (walker) history. Patient's preferred pharmacy's are [facility] and [facility] [phone] [place]. CSW will continue to follow.  Expected Discharge Plan: Skilled Nursing Facility Barriers to Discharge: Continued Medical Work up, SNF Pending bed offer   Patient Goals and CMS Choice            Expected Discharge Plan and Services In-house Referral: Clinical Social Work   Post Acute Care Choice: Skilled Nursing Facility Living arrangements for the past 2 months: Apartment                                      Prior Living Arrangements/Services Living arrangements for the past 2 months: Apartment Lives with:: Self Patient language and need for interpreter reviewed:: Yes          Care giver support system in place?: No (comment) Current home services: DME Criminal Activity/Legal Involvement Pertinent to Current Situation/Hospitalization: No - Comment as needed  Activities of Daily Living   ADL Screening (condition at time of admission) Independently performs ADLs?: Yes (appropriate for developmental age) Is the patient deaf or have difficulty hearing?: No Does the patient have difficulty seeing, even when wearing glasses/contacts?: No Does the patient have difficulty concentrating, remembering, or making decisions?:  No  Permission Sought/Granted Permission sought to share information with : Family Supports, Oceanographer granted to share information with : No (Contact information on chart)  Share Information with NAME: [name] Sniff  Permission granted to share info w AGENCY: SNF  Permission granted to share info w Relationship: Niece  Permission granted to share info w Contact Information: [phone]  Emotional Assessment       Orientation: : Oriented to Self, Oriented to Place, Oriented to  Time, Oriented to Situation Alcohol / Substance Use: Not Applicable Psych Involvement: No (comment)  Admission diagnosis:  Hypertensive emergency [I16.1] Patient Active Problem List   Diagnosis Date Noted   Hypertensive emergency 03/23/2024   Cognitive impairment 03/22/2024   Dry gangrene (HCC) 03/22/2024   HTN (hypertension) 09/13/2023   COVID-19 07/10/2023   Hyperkalemia 07/10/2023   Metabolic acidosis with increased anion gap and accumulation of organic acids 07/10/2023   Hypertensive urgency 07/10/2023   Hypoglycemia 07/10/2023   Weakness 07/10/2023   Syncope 07/05/2022   Syncope and collapse 07/04/2022   Type 2 diabetes mellitus (HCC) 07/04/2022   Anemia of chronic disease 07/04/2022   Acute metabolic encephalopathy due to hypoglycemia 12/30/2021   Hypothermia 12/30/2021   Uncontrolled type 2 diabetes mellitus with hypoglycemia, with long-term current use of insulin  (HCC) 12/30/2021   History of CVA (cerebrovascular accident) 12/30/2021   DNR (do not resuscitate) 12/30/2021   Stroke determined by clinical assessment (HCC) 12/18/2021   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 09/10/2014   Diabetes mellitus without complication (HCC) 09/10/2014   Hypertension 09/10/2014   Emphysema lung (HCC) 09/10/2014   PCP:  Shelda Atlas, MD Pharmacy:   [facility] [address], [place] - [address] [place] [address] Phone: [phone] Fax: [phone]  [place] [facility] [address] [place] [address] Phone: [phone] Fax: [phone]     Social Drivers of Health (SDOH) Social History: SDOH Screenings   Food Insecurity: No Food Insecurity (03/22/2024)  Housing: Low Risk  (03/22/2024)  Transportation Needs: No Transportation Needs (03/22/2024)  Utilities: Not At Risk (03/22/2024)  Social Connections: Moderately Isolated (03/22/2024)  Tobacco Use: Medium Risk (03/22/2024)   SDOH Interventions:     Readmission Risk Interventions    07/07/2022    9:49 AM  Readmission Risk Prevention Plan  Transportation Screening Complete  PCP or Specialist Appt within 3-5 Days Complete  HRI or Home Care Consult Complete  Social Work Consult for Recovery Care Planning/Counseling --  Palliative Care Screening Not Applicable  Medication Review Oceanographer) Complete

## 2024-03-25 NOTE — Progress Notes (Signed)
 Progress Note    Shawn Collins   FMW:[id number]  DOB: [birth year]  DOA: 03/22/2024     2 PCP: Shelda Atlas, MD  Initial CC: elevated BP  Hospital Course: Mr. Knutzen is a 73 yo male with PMH ESRD on HD, CAD, COPD, CVA, DM II, HTN, HLD who presented to the ER via EMS with elevated blood pressure. He has had 7 ER visits over the past 2 months all for uncontrolled blood pressure. He has been forgetful at home with possibly not taking medications.  There has been concern that he may not be able to continue living safely at home due to cognitive impairment. Presentation this time similar.  He thought he was checking glucose levels when in fact his blood pressure was found to be uncontrolled. He states he did go to dialysis this past Wednesday. HD center has also contacted the hospital due to concerns for patient returning safely at home.  He was admitted for further blood pressure control and ongoing dialysis during hospitalization.  PT, OT consulted as well as speech for cognitive evaluation.  Interval History:  Resting comfortably in bed this morning.  Still chronically confused state but able to answer most questions but he has obvious difficulty with higher level thinking and/or organizing thoughts.  Placement is definitely safest if possible.  Assessment and Plan: * Hypertensive emergency-resolved as of 03/22/2024 - Recurrent ED visits with severe range hypertension, chronic associated myocardial injury. Has predominantly systolic hypertension. Reports checking BP at home and reading high, no associated symptoms.  - On initial eval BP max 255/90, MAP 146. HS trop 58 -> 48, EKG without acute ischemic changes. CT head no acute findings.  - Continue home regimen along with PRN meds - increased hydralazine  10/25 - Fill history reviewed and appears appropriate  Cognitive impairment - patient oriented in ER, but cognition certainly slowed; unable to really explain reasoning for why he  misses meds at home - HD center has also reached out with concerns about patient returning home safely and continuing to care for himself - I briefly brought up assisted living with patient this morning and he was amenable however not sure financial situation or if this has been evaluated in the past -SLP consulted for cognitive assessment - TOC consulted for placement of possible.  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  -- Screening ABI, should establish with vascular surgery as outpatient   COPD (chronic obstructive pulmonary disease) (HCC) - Continue breathing treatments  CAD (coronary artery disease) - s/p CABG 2018 - continue plavix , ARB, and statin  Type 2 diabetes mellitus (HCC) - A1c 5% - SSI  History of CVA (cerebrovascular accident) - continue statin and plavix    Hyperlipidemia LDL goal <70 - continue statin   Antimicrobials:   DVT prophylaxis:  heparin  injection 5,000 Units Start: 03/22/24 1400   Code Status:   Code Status: Do not attempt resuscitation (DNR) PRE-ARREST INTERVENTIONS DESIRED  Mobility Assessment (Last 72 Hours)     Mobility Assessment     Row Name 03/24/24 1942 03/24/24 0800 03/23/24 2000 03/23/24 1500 03/23/24 1122   Does the patient have exclusion criteria? No - Perform mobility assessment No - Perform  mobility assessment No - Perform mobility assessment -- --   What is the highest level of mobility based on the mobility assessment? Level 4 (Ambulates with assistance) - Balance while stepping forward/back - Complete Level 4 (Ambulates with assistance) - Balance while  stepping forward/back - Complete Level 4 (Ambulates with assistance) - Balance while stepping forward/back - Complete Level 4 (Ambulates with assistance) - Balance while stepping forward/back - Complete Level 4 (Ambulates with assistance) - Balance while stepping forward/back - Complete   Is the above level different from baseline mobility prior to current illness? Yes - Recommend PT order Yes - Recommend PT order Yes - Recommend PT order -- --    Row Name 03/23/24 0800 03/22/24 2000 03/22/24 1834       Does the patient have exclusion criteria? No - Perform mobility assessment No - Perform mobility assessment No - Perform mobility assessment     What is the highest level of mobility based on the mobility assessment? Level 4 (Ambulates with assistance) - Balance while stepping forward/back - Complete Level 2 (Chairfast) - Balance while sitting on edge of bed and cannot stand Level 2 (Chairfast) - Balance while sitting on edge of bed and cannot stand     Is the above level different from baseline mobility prior to current illness? Yes - Recommend PT order Yes - Recommend PT order Yes - Recommend PT order        Barriers to discharge: Potential disposition issues Disposition Plan: TBD HH orders placed: N/A Status is: Inpatient  Objective: Blood pressure (!) 147/64, pulse 65, temperature (!) 97.5 F (36.4 C), resp. rate 12, height 5' 10 (1.778 m), weight 60.7 kg, SpO2 100%.  Examination:  Physical Exam Constitutional:      General: He is not in acute distress.    Appearance: Normal appearance.  HENT:     Head: Normocephalic and atraumatic.     Mouth/Throat:     Mouth: Mucous membranes are moist.  Eyes:     Extraocular Movements: Extraocular movements intact.  Cardiovascular:     Rate and Rhythm: Normal rate and regular rhythm.  Pulmonary:     Effort: Pulmonary effort is normal. No respiratory distress.     Breath sounds: Normal breath sounds. No wheezing.  Abdominal:     General:  Bowel sounds are normal. There is no distension.     Palpations: Abdomen is soft.     Tenderness: There is no abdominal tenderness.  Musculoskeletal:        General: Normal range of motion.     Cervical back: Normal range of motion and neck supple.  Skin:    General: Skin is warm and dry.  Neurological:     General: No focal deficit present.     Mental Status: He is alert.     Comments: Difficulty with comprehension and organizing thoughts  Psychiatric:        Mood and Affect: Mood normal.      Consultants:  Nephrology  Procedures:    Data Reviewed: Results for orders placed or performed during the hospital encounter of 03/22/24 (from the past 24 hours)  Glucose, capillary     Status: Abnormal   Collection Time: 03/24/24 11:35 AM  Result Value Ref Range   Glucose-Capillary 296 (H) 70 - 99 mg/dL  Glucose, capillary     Status: Abnormal   Collection Time: 03/24/24  4:20 PM  Result Value Ref Range   Glucose-Capillary 111 (H) 70 - 99 mg/dL  Glucose,  capillary     Status: Abnormal   Collection Time: 03/24/24  9:13 PM  Result Value Ref Range   Glucose-Capillary 165 (H) 70 - 99 mg/dL  CBC with Differential/Platelet     Status: Abnormal   Collection Time: 03/25/24  3:24 AM  Result Value Ref Range   WBC 6.8 4.0 - 10.5 K/uL   RBC 3.25 (L) 4.22 - 5.81 MIL/uL   Hemoglobin 10.1 (L) 13.0 - 17.0 g/dL   HCT 69.0 (L) 60.9 - 47.9 %   MCV 95.1 80.0 - 100.0 fL   MCH 31.1 26.0 - 34.0 pg   MCHC 32.7 30.0 - 36.0 g/dL   RDW 85.5 88.4 - 84.4 %   Platelets 261 150 - 400 K/uL   nRBC 0.3 (H) 0.0 - 0.2 %   Neutrophils Relative % 70 %   Neutro Abs 4.8 1.7 - 7.7 K/uL   Lymphocytes Relative 14 %   Lymphs Abs 1.0 0.7 - 4.0 K/uL   Monocytes Relative 12 %   Monocytes Absolute 0.8 0.1 - 1.0 K/uL   Eosinophils Relative 2 %   Eosinophils Absolute 0.1 0.0 - 0.5 K/uL   Basophils Relative 1 %   Basophils Absolute 0.1 0.0 - 0.1 K/uL   Immature Granulocytes 1 %   Abs Immature Granulocytes 0.08 (H)  0.00 - 0.07 K/uL  Magnesium      Status: None   Collection Time: 03/25/24  3:24 AM  Result Value Ref Range   Magnesium  2.0 1.7 - 2.4 mg/dL  Renal function panel     Status: Abnormal   Collection Time: 03/25/24  3:24 AM  Result Value Ref Range   Sodium 127 (L) 135 - 145 mmol/L   Potassium 5.1 3.5 - 5.1 mmol/L   Chloride 90 (L) 98 - 111 mmol/L   CO2 23 22 - 32 mmol/L   Glucose, Bld 86 70 - 99 mg/dL   BUN 45 (H) 8 - 23 mg/dL   Creatinine, Ser 1.16 (H) 0.61 - 1.24 mg/dL   Calcium  8.6 (L) 8.9 - 10.3 mg/dL   Phosphorus 5.2 (H) 2.5 - 4.6 mg/dL   Albumin  3.0 (L) 3.5 - 5.0 g/dL   GFR, Estimated 6 (L) >60 mL/min   Anion gap 14 5 - 15  Glucose, capillary     Status: None   Collection Time: 03/25/24  6:32 AM  Result Value Ref Range   Glucose-Capillary 77 70 - 99 mg/dL    I have reviewed pertinent nursing notes, vitals, labs, and images as necessary. I have ordered labwork to follow up on as indicated.  I have reviewed the last notes from staff over past 24 hours. I have discussed patient's care plan and test results with nursing staff, CM/SW, and other staff as appropriate.  Old records reviewed in assessment of this patient  Time spent: Greater than 50% of the 55 minute visit was spent in counseling/coordination of care for the patient as laid out in the A&P.   LOS: 2 days   Alm Apo, MD [facility] 03/25/2024, 10:22 AM

## 2024-03-25 NOTE — Progress Notes (Signed)
 [facility] Progress Note   Subjective:   Seen on HD. Having pain in his toe. Denies SOB, CP, dizziness, nausea.   Objective Vitals:   03/25/24 0426 03/25/24 0450 03/25/24 0700 03/25/24 0834  BP: (!) 189/77 (!) 158/88 (!) 121/99 (!) 154/70  Pulse: 71 70  65  Resp: 20 19 19 20   Temp: 98.2 F (36.8 C)  98.2 F (36.8 C)   TempSrc: Oral  Oral   SpO2: 95% 100% 97% 98%  Weight: 58.6 kg     Height:       Physical Exam General: Alert male in NAD Heart: RRR, no murmurs, rubs or gallops Lungs: CTA bilaterally, respirations unlabored Abdomen: Soft, non-distended, +BS Extremities: No edema b/l lower extremities Dialysis Access:  AVF cannulated  Additional Objective Labs: Basic Metabolic Panel: Recent Labs  Lab 03/22/24 0346 03/23/24 0231 03/25/24 0324  NA 135 132* 127*  K 5.0 3.7 5.1  CL 98 94* 90*  CO2 25 27 23   GLUCOSE 99 158* 86  BUN 47* 23 45*  CREATININE 7.86* 5.10* 8.83*  CALCIUM  8.8* 8.3* 8.6*  PHOS  --  3.8 5.2*   Liver Function Tests: Recent Labs  Lab 03/22/24 0346 03/23/24 0231 03/25/24 0324  AST 29  --   --   ALT 19  --   --   ALKPHOS 59  --   --   BILITOT 0.6  --   --   PROT 7.3  --   --   ALBUMIN  3.7 3.0* 3.0*   No results for input(s): LIPASE, AMYLASE in the last 168 hours. CBC: Recent Labs  Lab 03/22/24 0346 03/23/24 0231 03/25/24 0324  WBC 5.1 4.9 6.8  NEUTROABS 3.2 3.3 4.8  HGB 10.7* 10.2* 10.1*  HCT 34.2* 31.0* 30.9*  MCV 98.6 93.4 95.1  PLT 218 237 261   Blood Culture    Component Value Date/Time   SDES BLOOD RIGHT FOREARM 12/30/2021 0717   SPECREQUEST  12/30/2021 0717    BOTTLES DRAWN AEROBIC AND ANAEROBIC Blood Culture adequate volume   CULT  12/30/2021 0717    NO GROWTH 5 DAYS Performed at [facility], [address], [place], [place] [address]    REPTSTATUS 01/04/2022 FINAL 12/30/2021 9282    Cardiac Enzymes: No results for input(s): CKTOTAL, CKMB, CKMBINDEX, TROPONINI in the last 168  hours. CBG: Recent Labs  Lab 03/24/24 0607 03/24/24 1135 03/24/24 1620 03/24/24 2113 03/25/24 0632  GLUCAP 99 296* 111* 165* 77   Iron Studies: No results for input(s): IRON, TIBC, TRANSFERRIN, FERRITIN in the last 72 hours. @lablastinr3 @ Studies/Results: No results found. Medications:   amLODipine   10 mg Oral Daily   atorvastatin   40 mg Oral QHS   Chlorhexidine  Gluconate Cloth  6 each Topical Q0600   clopidogrel   75 mg Oral Daily   heparin   5,000 Units Subcutaneous Q8H   hydrALAZINE   100 mg Oral Q8H   insulin  aspart  0-6 Units Subcutaneous TID AC & HS   losartan   100 mg Oral Daily   sodium chloride  flush  3 mL Intravenous Q12H   spironolactone   25 mg Oral Daily    Dialysis Orders: MWF - [facility] 3hrs36min, BFR 450, DFR 800,  EDW 55.5kg, 2K/ 2.5Ca Heparin  None ordered Mircera 75 mcg q4wks - last 10/22 Hectorol  3mcg IV qHD  - last 10/22 Sensipar  30mg  with HD - last 10/22   Last Labs: Hgb 10.7, K 5.0, Ca 8.8, Alb 3.7  Assessment/Plan: 1.  Hypertensive urgency:  With malignant hypertension upon presentation secondary to nonadherence with oral antihypertensive therapy.  Blood pressures improved but still elevated this AM, follow post HD. Ongoing efforts for safe placement upon discharge. 2. ESRD: Typically on MWF dialysis schedule  3. Anemia: Hemoglobin and hematocrit level currently within acceptable range.  No overt blood loss and will defer ESA therapy at this time. 4. CKD-MBD: Corrected calcium  level and phosphorus level are both at goal, continue current management with renal diet. 5.  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 6.  Cognitive impairment: Difficult to assess cognitive function per SLP note but reassuring to see that the patient acknowledges that he has difficulties with recall/taking his medications as prescribed.     Lucie Collet, PA-C 03/25/2024, 8:39 AM  [facility] Pager: [phone]

## 2024-03-25 NOTE — [place] FL2 (Signed)
 [place]  MEDICAID FL2 LEVEL OF CARE FORM     IDENTIFICATION  Patient Name: Shawn Collins Birthdate: February 17, 1951 Sex: male Admission Date (Current Location): 03/22/2024  [facility] and [place] Number:  Producer, Television/film/video and Address:  The . [facility], [address], [place], [place] [address]      Provider Number: [id number]  Attending Physician Name and Address:  Patsy Lenis, MD  Relative Name and Phone Number:  [name] Sniff; Niece; [phone] and [name] Collum; Friend; [phone]    Current Level of Care: Hospital Recommended Level of Care: Skilled Nursing Facility Prior Approval Number:    Date Approved/Denied:   PASRR Number: [id number]  Discharge Plan: SNF    Current Diagnoses: Patient Active Problem List   Diagnosis Date Noted   Hypertensive emergency 03/23/2024   Cognitive impairment 03/22/2024   Dry gangrene (HCC) 03/22/2024   HTN (hypertension) 09/13/2023   COVID-19 07/10/2023   Hyperkalemia 07/10/2023   Metabolic acidosis with increased anion gap and accumulation of organic acids 07/10/2023   Hypertensive urgency 07/10/2023   Hypoglycemia 07/10/2023   Weakness 07/10/2023   Syncope 07/05/2022   Syncope and collapse 07/04/2022   Type 2 diabetes mellitus (HCC) 07/04/2022   Anemia of chronic disease 07/04/2022   Acute metabolic encephalopathy due to hypoglycemia 12/30/2021   Hypothermia 12/30/2021   Uncontrolled type 2 diabetes mellitus with hypoglycemia, with long-term current use of insulin  (HCC) 12/30/2021   History of CVA (cerebrovascular accident) 12/30/2021   DNR (do not resuscitate) 12/30/2021   Stroke determined by clinical assessment (HCC) 12/18/2021   ESRD (end stage renal disease) on dialysis (HCC) 02/23/2021   DOE (dyspnea on exertion) 11/22/2017   Hyperlipidemia LDL goal <70 04/17/2017   CAD (coronary artery disease)    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 09/10/2014   Diabetes mellitus without complication (HCC) 09/10/2014   Hypertension 09/10/2014   Emphysema lung (HCC) 09/10/2014    Orientation RESPIRATION BLADDER Height & Weight     Self, Time, Situation, Place  Normal (Room Air) Continent Weight: 132 lb 4.4 oz (60 kg) Height:  5' 10 (177.8 cm)  BEHAVIORAL SYMPTOMS/MOOD NEUROLOGICAL BOWEL NUTRITION STATUS      Continent Diet (Please see discharge summary)  AMBULATORY STATUS COMMUNICATION OF NEEDS Skin   Limited Assist Verbally Normal                       Personal Care Assistance Level of Assistance  Bathing, Feeding, Dressing Bathing Assistance: Limited assistance Feeding assistance: Limited assistance Dressing Assistance: Limited assistance     Functional Limitations Info  Sight Sight Info: Impaired (R and L)        SPECIAL CARE FACTORS FREQUENCY  PT (By licensed PT), OT (By licensed OT)     PT Frequency: 5x OT Frequency: 5x            Contractures Contractures Info: Not present    Additional Factors Info  Code Status, Allergies Code Status Info: DNR PRE-ARREST INTERVENTIONS DESIRED Allergies Info: NKA           Current Medications (03/25/2024):  This is the current hospital active medication list Current Facility-Administered Medications  Medication Dose Route Frequency Provider Last Rate Last Admin   acetaminophen  (TYLENOL ) tablet 1,000 mg  1,000 mg Oral Q6H PRN Segars, Dorn, MD       albuterol  (PROVENTIL ) (2.5 MG/3ML) 0.083% nebulizer solution 2.5 mg  2.5 mg Nebulization Q4H PRN Segars, Dorn, MD       amLODipine  (NORVASC ) tablet 10 mg  10 mg  Oral Daily Segars, Dorn, MD   10 mg at 03/25/24 1319   atorvastatin  (LIPITOR ) tablet 40 mg  40 mg Oral QHS Segars, Jonathan, MD   40 mg at 03/24/24 2125   Chlorhexidine  Gluconate Cloth 2 % PADS 6 each  6 each Topical Q0600 Lenon Charmaine BRAVO, NP   6 each at 03/24/24 9367   clopidogrel  (PLAVIX ) tablet 75 mg  75 mg Oral Daily Segars, Jonathan, MD   75 mg at 03/25/24 1319   heparin  injection 5,000 Units  5,000 Units Subcutaneous Q8H Segars, Jonathan, MD   5,000 Units at 03/25/24 0615   hydrALAZINE  (APRESOLINE ) tablet 100 mg  100 mg Oral Q8H Patsy Lenis, MD   100 mg at 03/25/24 9383   hydrOXYzine  (ATARAX ) tablet 10 mg  10 mg Oral BID PRN Segars, Jonathan, MD   10 mg at 03/25/24 0435   insulin  aspart (novoLOG ) injection 0-6 Units  0-6 Units Subcutaneous TID AC & HS Patsy Lenis, MD   3 Units at 03/24/24 1211   labetalol  (NORMODYNE ) injection 10 mg  10 mg Intravenous Q4H PRN Patsy Lenis, MD   10 mg at 03/25/24 0436   losartan  (COZAAR ) tablet 100 mg  100 mg Oral Daily Segars, Dorn, MD   100 mg at 03/25/24 1319   melatonin tablet 6 mg  6 mg Oral QHS PRN Segars, Jonathan, MD   6 mg at 03/24/24 2125   ondansetron  (ZOFRAN ) injection 4 mg  4 mg Intravenous Q6H PRN Keturah Dorn, MD   4 mg at 03/24/24 0101   polyethylene glycol (MIRALAX  / GLYCOLAX ) packet 17 g  17 g Oral Daily PRN Keturah Dorn, MD   17 g at 03/25/24 1322   sodium chloride  flush (NS) 0.9 % injection 3 mL  3 mL Intravenous Q12H Segars, Jonathan, MD   3 mL at 03/25/24 1319   spironolactone  (ALDACTONE ) tablet 25 mg  25 mg Oral Daily Segars, Jonathan, MD   25 mg at 03/25/24 1319     Discharge Medications: Please see discharge summary for a list of discharge medications.  Relevant Imaging Results:  Relevant Lab Results:   Additional Information SSN: [id number]; out-pt HD at [facility] on MWF 10:25 am chair time.  Jaquaya Coyle E Carry Ortez, LCSWA

## 2024-03-25 NOTE — Progress Notes (Signed)
  Received patient in bed to unit.   Informed consent signed and in chart.    TX duration:3.25     Transported by  Hand-off given to patient's nurse.    Access used: Left AVF Access issues: None   Total UF removed: 2300 Medication(s) given: None Post HD VS: 154-60      Hunter Hacking LPN [facility]

## 2024-03-26 DIAGNOSIS — I161 Hypertensive emergency: Secondary | ICD-10-CM | POA: Diagnosis not present

## 2024-03-26 DIAGNOSIS — Z992 Dependence on renal dialysis: Secondary | ICD-10-CM | POA: Diagnosis not present

## 2024-03-26 DIAGNOSIS — N186 End stage renal disease: Secondary | ICD-10-CM | POA: Diagnosis not present

## 2024-03-26 DIAGNOSIS — I159 Secondary hypertension, unspecified: Secondary | ICD-10-CM | POA: Diagnosis not present

## 2024-03-26 LAB — CBC WITH DIFFERENTIAL/PLATELET
Abs Immature Granulocytes: 0.05 K/uL (ref 0.00–0.07)
Basophils Absolute: 0 K/uL (ref 0.0–0.1)
Basophils Relative: 1 %
Eosinophils Absolute: 0.1 K/uL (ref 0.0–0.5)
Eosinophils Relative: 2 %
HCT: 30 % — ABNORMAL LOW (ref 39.0–52.0)
Hemoglobin: 10 g/dL — ABNORMAL LOW (ref 13.0–17.0)
Immature Granulocytes: 1 %
Lymphocytes Relative: 13 %
Lymphs Abs: 0.9 K/uL (ref 0.7–4.0)
MCH: 31.3 pg (ref 26.0–34.0)
MCHC: 33.3 g/dL (ref 30.0–36.0)
MCV: 93.8 fL (ref 80.0–100.0)
Monocytes Absolute: 1 K/uL (ref 0.1–1.0)
Monocytes Relative: 15 %
Neutro Abs: 4.5 K/uL (ref 1.7–7.7)
Neutrophils Relative %: 68 %
Platelets: 284 K/uL (ref 150–400)
RBC: 3.2 MIL/uL — ABNORMAL LOW (ref 4.22–5.81)
RDW: 14.9 % (ref 11.5–15.5)
WBC: 6.6 K/uL (ref 4.0–10.5)
nRBC: 0.3 % — ABNORMAL HIGH (ref 0.0–0.2)

## 2024-03-26 LAB — RENAL FUNCTION PANEL
Albumin: 2.8 g/dL — ABNORMAL LOW (ref 3.5–5.0)
Anion gap: 12 (ref 5–15)
BUN: 25 mg/dL — ABNORMAL HIGH (ref 8–23)
CO2: 26 mmol/L (ref 22–32)
Calcium: 8.3 mg/dL — ABNORMAL LOW (ref 8.9–10.3)
Chloride: 91 mmol/L — ABNORMAL LOW (ref 98–111)
Creatinine, Ser: 5.93 mg/dL — ABNORMAL HIGH (ref 0.61–1.24)
GFR, Estimated: 9 mL/min — ABNORMAL LOW (ref >60)
Glucose, Bld: 73 mg/dL (ref 70–99)
Phosphorus: 4.6 mg/dL (ref 2.5–4.6)
Potassium: 4.6 mmol/L (ref 3.5–5.1)
Sodium: 129 mmol/L — ABNORMAL LOW (ref 135–145)

## 2024-03-26 LAB — GLUCOSE, CAPILLARY
Glucose-Capillary: 73 mg/dL (ref 70–99)
Glucose-Capillary: 110 mg/dL — ABNORMAL HIGH (ref 70–99)
Glucose-Capillary: 158 mg/dL — ABNORMAL HIGH (ref 70–99)

## 2024-03-26 LAB — MAGNESIUM: Magnesium: 2 mg/dL (ref 1.7–2.4)

## 2024-03-26 MED ORDER — CLONIDINE HCL 0.1 MG PO TABS
0.1000 mg | ORAL_TABLET | Freq: Two times a day (BID) | ORAL | Status: DC
  Administered 2024-03-26 – 2024-03-28 (×6): 0.1 mg via ORAL
  Filled 2024-03-26 (×6): qty 1

## 2024-03-26 MED ORDER — CHLORHEXIDINE GLUCONATE CLOTH 2 % EX PADS: 6.0000 | MEDICATED_PAD | Freq: Every day | CUTANEOUS | Status: DC

## 2024-03-26 NOTE — Care Management Important Message (Signed)
 Important Message  Patient Details  Name: Shawn Collins MRN: [id number] Date of Birth: 07-06-1950   Important Message Given:  Yes - Medicare IM     Claretta Deed 03/26/2024, 1:07 PM

## 2024-03-26 NOTE — Progress Notes (Signed)
 [facility] Progress Note   Subjective:   Pt seen in room. Reports he is tired, no other complaints. Denies SOB, CP, dizziness, nausea. BP is still elevated.   Objective Vitals:   03/25/24 2328 03/26/24 0549 03/26/24 0800 03/26/24 1107  BP: (!) 158/68 (!) 183/76 (!) 169/72 (!) 197/84  Pulse: 72  73 71  Resp: 18 19 20 18   Temp:   98.2 F (36.8 C) 98.1 F (36.7 C)  TempSrc:   Oral Oral  SpO2: 96% 98% 96% 97%  Weight:      Height:       Physical Exam General: Alert male in NAD Heart: RRR, no murmurs, rubs or gallops Lungs: CTA bilaterally, respirations unlabored Abdomen: Soft, non-distended, +BS Extremities: No edema b/l lower extremities Dialysis Access:  AVF +t/b    Additional Objective Labs: Basic Metabolic Panel: Recent Labs  Lab 03/23/24 0231 03/25/24 0324 03/26/24 0313  NA 132* 127* 129*  K 3.7 5.1 4.6  CL 94* 90* 91*  CO2 27 23 26   GLUCOSE 158* 86 73  BUN 23 45* 25*  CREATININE 5.10* 8.83* 5.93*  CALCIUM  8.3* 8.6* 8.3*  PHOS 3.8 5.2* 4.6   Liver Function Tests: Recent Labs  Lab 03/22/24 0346 03/23/24 0231 03/25/24 0324 03/26/24 0313  AST 29  --   --   --   ALT 19  --   --   --   ALKPHOS 59  --   --   --   BILITOT 0.6  --   --   --   PROT 7.3  --   --   --   ALBUMIN  3.7 3.0* 3.0* 2.8*   No results for input(s): LIPASE, AMYLASE in the last 168 hours. CBC: Recent Labs  Lab 03/22/24 0346 03/23/24 0231 03/25/24 0324 03/26/24 0313  WBC 5.1 4.9 6.8 6.6  NEUTROABS 3.2 3.3 4.8 4.5  HGB 10.7* 10.2* 10.1* 10.0*  HCT 34.2* 31.0* 30.9* 30.0*  MCV 98.6 93.4 95.1 93.8  PLT 218 237 261 284   Blood Culture    Component Value Date/Time   SDES BLOOD RIGHT FOREARM 12/30/2021 0717   SPECREQUEST  12/30/2021 0717    BOTTLES DRAWN AEROBIC AND ANAEROBIC Blood Culture adequate volume   CULT  12/30/2021 0717    NO GROWTH 5 DAYS Performed at [facility], [address] [place], [place] [address]    REPTSTATUS 01/04/2022 FINAL  12/30/2021 9282    Cardiac Enzymes: No results for input(s): CKTOTAL, CKMB, CKMBINDEX, TROPONINI in the last 168 hours. CBG: Recent Labs  Lab 03/25/24 1312 03/25/24 1612 03/25/24 2126 03/26/24 0629 03/26/24 1106  GLUCAP 100* 129* 153* 73 110*   Iron Studies: No results for input(s): IRON, TIBC, TRANSFERRIN, FERRITIN in the last 72 hours. @lablastinr3 @ Studies/Results: No results found. Medications:   amLODipine   10 mg Oral Daily   atorvastatin   40 mg Oral QHS   Chlorhexidine  Gluconate Cloth  6 each Topical Q0600   clopidogrel   75 mg Oral Daily   heparin   5,000 Units Subcutaneous Q8H   hydrALAZINE   100 mg Oral Q8H   insulin  aspart  0-6 Units Subcutaneous TID AC & HS   losartan   100 mg Oral Daily   sodium chloride  flush  3 mL Intravenous Q12H   spironolactone   25 mg Oral Daily    Dialysis Orders:  MWF - [facility] 3hrs48min, BFR 450, DFR 800,  EDW 55.5kg, 2K/ 2.5Ca Heparin  None ordered Mircera 75 mcg q4wks - last 10/22  Hectorol  3mcg IV qHD  - last 10/22 Sensipar  30mg  with HD - last 10/22   Last Labs: Hgb 10.7, K 5.0, Ca 8.8, Alb 3.7  Assessment/Plan: 1.  Hypertensive urgency: With malignant hypertension upon presentation secondary to nonadherence with oral antihypertensive therapy.  Blood pressures remain elevated, last med adjustment was 03/23/24. Na is still low, will increase UF goal tomorrow and try to get fluid down further 2. ESRD: Typically on MWF dialysis schedule, HD tomorrow 3. Anemia: Hemoglobin and hematocrit level currently within acceptable range.  No overt blood loss and will defer ESA therapy at this time. 4. CKD-MBD: Corrected calcium  level and phosphorus level are both at goal, continue current management with renal diet. 5.  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 6.  Cognitive impairment: Difficult to  assess cognitive function per SLP note but reassuring to see that the patient acknowledges that he has difficulties with recall/taking his medications as prescribed.  Lucie Collet, PA-C 03/26/2024, 11:37 AM  [facility] Pager: [phone]

## 2024-03-26 NOTE — Care Management Important Message (Signed)
 Important Message  Patient Details  Name: Shawn Collins MRN: [id number] Date of Birth: 04/28/1951   Important Message Given:  Yes - Medicare IM     Claretta Deed 03/26/2024, 1:22 PM

## 2024-03-26 NOTE — Progress Notes (Signed)
 Progress Note    Shawn Collins   FMW:[id number]  DOB: [birth year]  DOA: 03/22/2024     3 PCP: Shelda Atlas, MD  Initial CC: elevated BP  Hospital Course: Shawn Collins is a 73 yo male with PMH ESRD on HD, CAD, COPD, CVA, DM II, HTN, HLD who presented to the ER via EMS with elevated blood pressure. He has had 7 ER visits over the past 2 months all for uncontrolled blood pressure. He has been forgetful at home with possibly not taking medications.  There has been concern that he may not be able to continue living safely at home due to cognitive impairment. Presentation this time similar.  He thought he was checking glucose levels when in fact his blood pressure was found to be uncontrolled. He states he did go to dialysis this past Wednesday. HD center has also contacted the hospital due to concerns for patient returning safely at home.  He was admitted for further blood pressure control and ongoing dialysis during hospitalization.  PT, OT consulted as well as speech for cognitive evaluation.  Interval History:  No events overnight.  Resting comfortably in bed this morning.  Blood pressures have relatively been improved, some elevation noted today and regimen further adjusted. He is still amenable with placement.  Assessment and Plan: * Hypertensive emergency-resolved as of 03/22/2024 - Recurrent ED visits with severe range hypertension, chronic associated myocardial injury. Has predominantly systolic hypertension. Reports checking BP at home and reading high, no associated symptoms.  - On initial eval BP max 255/90, MAP 146. HS trop 58 -> 48, EKG without acute ischemic changes. CT head no acute findings.  - Continue home regimen along with PRN meds - increased hydralazine  10/25 - Fill history reviewed and appears appropriate  Cognitive impairment - patient oriented in ER, but cognition certainly slowed; unable to really explain reasoning for why he misses meds at home - HD center  has also reached out with concerns about patient returning home safely and continuing to care for himself - I briefly brought up assisted living with patient this morning and he was amenable however not sure financial situation or if this has been evaluated in the past -SLP consulted for cognitive assessment - TOC consulted for placement of possible.  PT/OT evals ordered; SNF recommended  ESRD (end stage renal disease) on dialysis [facility]) - Nephrology consulted for ongoing HD during hospitalization  HTN (hypertension) - continue amlodipine , hydralazine , losartan , spironolactone  - Hydralazine  increased on 03/23/2024 - BP still above goal at times; clonidine  added 03/26/2024; follow-up blood pressure response  Dry gangrene (HCC) Appears to have dry gangrene of the L 5th and R 2nd distal toes, likely underlying significant PAD with his hx vasculopathy.  -- Screening ABI, should establish with vascular surgery as outpatient   COPD (chronic obstructive pulmonary disease) (HCC) - Continue breathing treatments  CAD (coronary artery disease) - s/p CABG 2018 - continue plavix , ARB, and statin  Type 2 diabetes mellitus (HCC) - A1c 5% - SSI  History of CVA (cerebrovascular accident) - continue statin and plavix    Hyperlipidemia LDL goal <70 - continue statin   Antimicrobials:   DVT prophylaxis:  heparin  injection 5,000 Units Start: 03/22/24 1400   Code Status:   Code Status: Do not attempt resuscitation (DNR) PRE-ARREST INTERVENTIONS DESIRED  Mobility Assessment (Last 72 Hours)     Mobility Assessment     Row Name 03/26/24 1000 03/25/24 1945 03/25/24 0750 03/24/24 1942 03/24/24 0800   Does the patient have exclusion  criteria? -- No - Perform mobility assessment No - Perform mobility assessment No - Perform mobility assessment No - Perform mobility assessment   What is the highest level of mobility based on the mobility assessment? Level 4 (Ambulates with assistance) - Balance  while stepping forward/back - Complete Level 4 (Ambulates with assistance) - Balance while stepping forward/back - Complete Level 4 (Ambulates with assistance) - Balance while stepping forward/back - Complete Level 4 (Ambulates with assistance) - Balance while stepping forward/back - Complete Level 4 (Ambulates with assistance) - Balance while stepping forward/back - Complete   Is the above level different from baseline mobility prior to current illness? -- Yes - Recommend PT order Yes - Recommend PT order Yes - Recommend PT order Yes - Recommend PT order    Row Name 03/23/24 2000 03/23/24 1500         Does the patient have exclusion criteria? No - Perform mobility assessment --      What is the highest level of mobility based on the mobility assessment? Level 4 (Ambulates with assistance) - Balance while stepping forward/back - Complete Level 4 (Ambulates with assistance) - Balance while stepping forward/back - Complete      Is the above level different from baseline mobility prior to current illness? Yes - Recommend PT order --         Barriers to discharge: Potential disposition issues Disposition Plan: TBD.  Aiming for SNF but needs long-term care HH orders placed: N/A Status is: Inpatient  Objective: Blood pressure (!) 197/84, pulse 71, temperature 98.1 F (36.7 C), temperature source Oral, resp. rate 18, height 5' 10 (1.778 m), weight 60 kg, SpO2 97%.  Examination:  Physical Exam Constitutional:      General: He is not in acute distress.    Appearance: Normal appearance.  HENT:     Head: Normocephalic and atraumatic.     Mouth/Throat:     Mouth: Mucous membranes are moist.  Eyes:     Extraocular Movements: Extraocular movements intact.  Cardiovascular:     Rate and Rhythm: Normal rate and regular rhythm.  Pulmonary:     Effort: Pulmonary effort is normal. No respiratory distress.     Breath sounds: Normal breath sounds. No wheezing.  Abdominal:     General: Bowel sounds  are normal. There is no distension.     Palpations: Abdomen is soft.     Tenderness: There is no abdominal tenderness.  Musculoskeletal:        General: Normal range of motion.     Cervical back: Normal range of motion and neck supple.  Skin:    General: Skin is warm and dry.  Neurological:     General: No focal deficit present.     Mental Status: He is alert.     Comments: Difficulty with comprehension and organizing thoughts  Psychiatric:        Mood and Affect: Mood normal.      Consultants:  Nephrology  Procedures:    Data Reviewed: Results for orders placed or performed during the hospital encounter of 03/22/24 (from the past 24 hours)  Glucose, capillary     Status: Abnormal   Collection Time: 03/25/24  1:12 PM  Result Value Ref Range   Glucose-Capillary 100 (H) 70 - 99 mg/dL  Glucose, capillary     Status: Abnormal   Collection Time: 03/25/24  4:12 PM  Result Value Ref Range   Glucose-Capillary 129 (H) 70 - 99 mg/dL  Glucose, capillary  Status: Abnormal   Collection Time: 03/25/24  9:26 PM  Result Value Ref Range   Glucose-Capillary 153 (H) 70 - 99 mg/dL  CBC with Differential/Platelet     Status: Abnormal   Collection Time: 03/26/24  3:13 AM  Result Value Ref Range   WBC 6.6 4.0 - 10.5 K/uL   RBC 3.20 (L) 4.22 - 5.81 MIL/uL   Hemoglobin 10.0 (L) 13.0 - 17.0 g/dL   HCT 69.9 (L) 60.9 - 47.9 %   MCV 93.8 80.0 - 100.0 fL   MCH 31.3 26.0 - 34.0 pg   MCHC 33.3 30.0 - 36.0 g/dL   RDW 85.0 88.4 - 84.4 %   Platelets 284 150 - 400 K/uL   nRBC 0.3 (H) 0.0 - 0.2 %   Neutrophils Relative % 68 %   Neutro Abs 4.5 1.7 - 7.7 K/uL   Lymphocytes Relative 13 %   Lymphs Abs 0.9 0.7 - 4.0 K/uL   Monocytes Relative 15 %   Monocytes Absolute 1.0 0.1 - 1.0 K/uL   Eosinophils Relative 2 %   Eosinophils Absolute 0.1 0.0 - 0.5 K/uL   Basophils Relative 1 %   Basophils Absolute 0.0 0.0 - 0.1 K/uL   Immature Granulocytes 1 %   Abs Immature Granulocytes 0.05 0.00 - 0.07 K/uL   Magnesium      Status: None   Collection Time: 03/26/24  3:13 AM  Result Value Ref Range   Magnesium  2.0 1.7 - 2.4 mg/dL  Renal function panel     Status: Abnormal   Collection Time: 03/26/24  3:13 AM  Result Value Ref Range   Sodium 129 (L) 135 - 145 mmol/L   Potassium 4.6 3.5 - 5.1 mmol/L   Chloride 91 (L) 98 - 111 mmol/L   CO2 26 22 - 32 mmol/L   Glucose, Bld 73 70 - 99 mg/dL   BUN 25 (H) 8 - 23 mg/dL   Creatinine, Ser 4.06 (H) 0.61 - 1.24 mg/dL   Calcium  8.3 (L) 8.9 - 10.3 mg/dL   Phosphorus 4.6 2.5 - 4.6 mg/dL   Albumin  2.8 (L) 3.5 - 5.0 g/dL   GFR, Estimated 9 (L) >60 mL/min   Anion gap 12 5 - 15  Glucose, capillary     Status: None   Collection Time: 03/26/24  6:29 AM  Result Value Ref Range   Glucose-Capillary 73 70 - 99 mg/dL  Glucose, capillary     Status: Abnormal   Collection Time: 03/26/24 11:06 AM  Result Value Ref Range   Glucose-Capillary 110 (H) 70 - 99 mg/dL    I have reviewed pertinent nursing notes, vitals, labs, and images as necessary. I have ordered labwork to follow up on as indicated.  I have reviewed the last notes from staff over past 24 hours. I have discussed patient's care plan and test results with nursing staff, CM/SW, and other staff as appropriate.  Old records reviewed in assessment of this patient  Time spent: Greater than 50% of the 55 minute visit was spent in counseling/coordination of care for the patient as laid out in the A&P.   LOS: 3 days   Alm Apo, MD [facility] 03/26/2024, 11:42 AM

## 2024-03-26 NOTE — Progress Notes (Signed)
 Physical Therapy Treatment Patient Details Name: Sony Schlarb MRN: [id number] DOB: 03/25/1951 Today's Date: 03/26/2024   History of Present Illness Indie Nickerson is a 73 y.o. male admitted with hypertensive urgency/emergency, intermittent nonadherence with dialysis and antihypertensives, who presented due to uncontrolled hypertension.  Dry gangrene of left 5th and right 2nd toe. Hx of ESRD on MWF HD, CAD s/p CABG, CVA, COPD, hypertension, hyperlipidemia, recent frequent    PT Comments  Pt admitted with above diagnosis. Pt was able to ambulate with RW with cues and min assist overall as pt needs assist with challenges to balance.  Pt scored 14/24 on DGI suggesting risk of falls without device. Pt needed min assist to perform stairs needing a lot of cues for technique. Continues to need post acute rehab < 3 hours day.  Pt agrees.  Pt currently with functional limitations due to the deficits listed below (see PT Problem List). Pt will benefit from acute skilled PT to increase their independence and safety with mobility to allow discharge.       If plan is discharge home, recommend the following: Assistance with cooking/housework;Assist for transportation;Supervision due to cognitive status;Help with stairs or ramp for entrance   Can travel by private vehicle     Yes  Equipment Recommendations  None recommended by PT    Recommendations for Other Services       Precautions / Restrictions Precautions Precautions: Fall Restrictions Weight Bearing Restrictions Per Provider Order: No     Mobility  Bed Mobility Overal bed mobility: Needs Assistance Bed Mobility: Supine to Sit     Supine to sit: Supervision          Transfers Overall transfer level: Needs assistance Equipment used: Rolling walker (2 wheels) Transfers: Sit to/from Stand, Bed to chair/wheelchair/BSC Sit to Stand: Min assist           General transfer comment: Pt needed steadying assist to power up. once up can  balance with RW however used cane PTA    Ambulation/Gait Ambulation/Gait assistance: Min assist Gait Distance (Feet): 550 Feet Assistive device: Rolling walker (2 wheels) Gait Pattern/deviations: Step-through pattern, Decreased stride length, Trunk flexed   Gait velocity interpretation: 1.31 - 2.62 ft/sec, indicative of limited community ambulator   General Gait Details: Pt was able to progress ambulation to hallway with RW with overall good safety. Pt used cane PTA and would like to do the same at home. At present only safe with RW use/bil UE support. Pt needed cues to stay close to RW.  Needed closer assist wtih challenges to balance and does fatigue with longer walk.   Stairs Stairs: Yes Stairs assistance: Min assist Stair Management: One rail Right, Step to pattern, Alternating pattern, Forwards, Sideways Number of Stairs: 6 General stair comments: Pt initially attempting to do alternating pattern and due to right 2nd toe, had difficulty needing min assist for safety and balance.  Once pt did step to pattern, less assist needed. Also helped with pt holding rail with both hands for incr support.   Wheelchair Mobility     Tilt Bed    Modified Rankin (Stroke Patients Only)       Balance Overall balance assessment: Needs assistance Sitting-balance support: Feet supported Sitting balance-Leahy Scale: Fair     Standing balance support: No upper extremity supported, During functional activity, Bilateral upper extremity supported Standing balance-Leahy Scale: Poor Standing balance comment: Can stand statically with RW with CGA.  Min assist for balance once ambulating with the RW with challenges.  Standardized Balance Assessment Standardized Balance Assessment : Dynamic Gait Index   Dynamic Gait Index Level Surface: Mild Impairment Change in Gait Speed: Mild Impairment Gait with Horizontal Head Turns: Mild Impairment Gait with Vertical Head Turns:  Mild Impairment Gait and Pivot Turn: Mild Impairment Step Over Obstacle: Mild Impairment Step Around Obstacles: Mild Impairment Steps: Severe Impairment Total Score: 14      Communication Communication Communication: No apparent difficulties Factors Affecting Communication: Reduced clarity of speech  Cognition Arousal: Alert Behavior During Therapy: WFL for tasks assessed/performed   PT - Cognitive impairments: History of cognitive impairments                         Following commands: Intact      Cueing Cueing Techniques: Verbal cues  Exercises      General Comments General comments (skin integrity, edema, etc.): VSS      Pertinent Vitals/Pain Pain Assessment Pain Assessment: Faces Faces Pain Scale: Hurts even more Pain Location: right 2nd toe Pain Descriptors / Indicators: Discomfort, Grimacing, Guarding Pain Intervention(s): Limited activity within patient's tolerance, Monitored during session, Repositioned    Home Living                          Prior Function            PT Goals (current goals can now be found in the care plan section) Acute Rehab PT Goals Patient Stated Goal: to be able to take his medications Progress towards PT goals: Progressing toward goals    Frequency    Min 2X/week      PT Plan      Co-evaluation              AM-PAC PT 6 Clicks Mobility   Outcome Measure  Help needed turning from your back to your side while in a flat bed without using bedrails?: A Little Help needed moving from lying on your back to sitting on the side of a flat bed without using bedrails?: A Little Help needed moving to and from a bed to a chair (including a wheelchair)?: A Lot Help needed standing up from a chair using your arms (e.g., wheelchair or bedside chair)?: A Little Help needed to walk in hospital room?: A Little Help needed climbing 3-5 steps with a railing? : Total 6 Click Score: 15    End of Session  Equipment Utilized During Treatment: Gait belt Activity Tolerance: Patient limited by fatigue Patient left: with call bell/phone within reach;in chair;with chair alarm set Nurse Communication: Mobility status PT Visit Diagnosis: Muscle weakness (generalized) (M62.81)     Time: 0912-0928 PT Time Calculation (min) (ACUTE ONLY): 16 min  Charges:    $Gait Training: 8-22 mins PT General Charges $$ ACUTE PT VISIT: 1 Visit                     Tniya Bowditch M,PT Acute Rehab Services [phone]    Stephane JULIANNA Bevel 03/26/2024, 10:17 AM

## 2024-03-26 NOTE — Plan of Care (Signed)

## 2024-03-27 DIAGNOSIS — I161 Hypertensive emergency: Secondary | ICD-10-CM | POA: Diagnosis not present

## 2024-03-27 LAB — RENAL FUNCTION PANEL
Albumin: 2.7 g/dL — ABNORMAL LOW (ref 3.5–5.0)
Anion gap: 13 (ref 5–15)
BUN: 43 mg/dL — ABNORMAL HIGH (ref 8–23)
CO2: 24 mmol/L (ref 22–32)
Calcium: 8.2 mg/dL — ABNORMAL LOW (ref 8.9–10.3)
Chloride: 90 mmol/L — ABNORMAL LOW (ref 98–111)
Creatinine, Ser: 7.88 mg/dL — ABNORMAL HIGH (ref 0.61–1.24)
GFR, Estimated: 7 mL/min — ABNORMAL LOW (ref >60)
Glucose, Bld: 170 mg/dL — ABNORMAL HIGH (ref 70–99)
Phosphorus: 6.4 mg/dL — ABNORMAL HIGH (ref 2.5–4.6)
Potassium: 5.2 mmol/L — ABNORMAL HIGH (ref 3.5–5.1)
Sodium: 127 mmol/L — ABNORMAL LOW (ref 135–145)

## 2024-03-27 LAB — CBC WITH DIFFERENTIAL/PLATELET
Abs Immature Granulocytes: 0.05 K/uL (ref 0.00–0.07)
Basophils Absolute: 0.1 K/uL (ref 0.0–0.1)
Basophils Relative: 1 %
Eosinophils Absolute: 0.1 K/uL (ref 0.0–0.5)
Eosinophils Relative: 2 %
HCT: 28.5 % — ABNORMAL LOW (ref 39.0–52.0)
Hemoglobin: 9.4 g/dL — ABNORMAL LOW (ref 13.0–17.0)
Immature Granulocytes: 1 %
Lymphocytes Relative: 17 %
Lymphs Abs: 0.8 K/uL (ref 0.7–4.0)
MCH: 31.1 pg (ref 26.0–34.0)
MCHC: 33 g/dL (ref 30.0–36.0)
MCV: 94.4 fL (ref 80.0–100.0)
Monocytes Absolute: 0.8 K/uL (ref 0.1–1.0)
Monocytes Relative: 16 %
Neutro Abs: 3.2 K/uL (ref 1.7–7.7)
Neutrophils Relative %: 63 %
Platelets: 259 K/uL (ref 150–400)
RBC: 3.02 MIL/uL — ABNORMAL LOW (ref 4.22–5.81)
RDW: 15.3 % (ref 11.5–15.5)
WBC: 5 K/uL (ref 4.0–10.5)
nRBC: 0 % (ref 0.0–0.2)

## 2024-03-27 LAB — GLUCOSE, CAPILLARY
Glucose-Capillary: 108 mg/dL — ABNORMAL HIGH (ref 70–99)
Glucose-Capillary: 209 mg/dL — ABNORMAL HIGH (ref 70–99)

## 2024-03-27 LAB — MAGNESIUM: Magnesium: 2 mg/dL (ref 1.7–2.4)

## 2024-03-27 MED ORDER — NEPRO/CARBSTEADY PO LIQD
237.0000 mL | Freq: Three times a day (TID) | ORAL | Status: DC
  Administered 2024-03-27 – 2024-03-28 (×4): 237 mL via ORAL

## 2024-03-27 MED ORDER — ASPIRIN 81 MG PO TBEC
81.0000 mg | DELAYED_RELEASE_TABLET | Freq: Every day | ORAL | Status: DC
  Administered 2024-03-28 – 2024-03-29 (×2): 81 mg via ORAL
  Filled 2024-03-27 (×2): qty 1

## 2024-03-27 NOTE — TOC Progression Note (Signed)
 Transition of Care [facility]) - Progression Note    Patient Details  Name: Shawn Collins MRN: [id number] Date of Birth: [birth year]  [facility]) CM/SW Contact  Lendia Dais, [place] Phone Number: 03/27/2024, 4:33 PM  Clinical Narrative:   CSW spoke to pt at bedside and pt stated that his preferences are [place], [facility], and [facility].  CSW spoke to Quandra via phone and stated that they do not have a bed available at the moment but could change. Pt still needs insurance auth, website for insurance shara is down today. CSW will start auth tomorrow.  CSW will continue to follow.    Expected Discharge Plan: Skilled Nursing Facility Barriers to Discharge: Continued Medical Work up, SNF Pending bed offer               Expected Discharge Plan and Services In-house Referral: Clinical Social Work   Post Acute Care Choice: Skilled Nursing Facility Living arrangements for the past 2 months: Apartment                                       Social Drivers of Health (SDOH) Interventions SDOH Screenings   Food Insecurity: No Food Insecurity (03/22/2024)  Housing: Low Risk  (03/22/2024)  Transportation Needs: No Transportation Needs (03/22/2024)  Utilities: Not At Risk (03/22/2024)  Social Connections: Moderately Isolated (03/22/2024)  Tobacco Use: Medium Risk (03/22/2024)    Readmission Risk Interventions    07/07/2022    9:49 AM  Readmission Risk Prevention Plan  Transportation Screening Complete  PCP or Specialist Appt within 3-5 Days Complete  HRI or Home Care Consult Complete  Social Work Consult for Recovery Care Planning/Counseling --  Palliative Care Screening Not Applicable  Medication Review [employer]) Complete

## 2024-03-27 NOTE — Progress Notes (Signed)
 SLP Cancellation Note  Patient Details Name: Shawn Collins MRN: [id number] DOB: 14-May-1951   Cancelled treatment:       Reason Eval/Treat Not Completed: Patient at procedure or test/unavailable (HD). SLP will f/u as able.    Damien Blumenthal, M.A., CCC-SLP Speech Language Pathology, Acute Rehabilitation Services  Secure Chat preferred [phone]  03/27/2024, 10:11 AM

## 2024-03-27 NOTE — Progress Notes (Signed)
 [facility] Progress Note   Subjective:   Seen at the start of HD. BP still elevated and he reports he feels like he has a lot of fluid on, but denies SOB, CP, dizziness. Increased his UF goal.   Objective Vitals:   03/26/24 1641 03/26/24 2030 03/26/24 2032 03/27/24 0803  BP: (!) 146/71 (!) 141/76 (!) 155/70 (!) 170/74  Pulse: 62 70 68 62  Resp: 18 18 18 13   Temp: 97.9 F (36.6 C) 97.8 F (36.6 C) 97.8 F (36.6 C) (!) 97.2 F (36.2 C)  TempSrc:    Axillary  SpO2: 100% 99% 98% 99%  Weight:    60.6 kg  Height:       Physical Exam General: Alert male in NAD Heart: RRR, no murmurs, rubs or gallops Lungs: CTA bilaterally, respirations unlabored Abdomen: Soft, non-distended, +BS Extremities: No edema b/l lower extremities Dialysis Access:  AVF +t/b    Additional Objective Labs: Basic Metabolic Panel: Recent Labs  Lab 03/25/24 0324 03/26/24 0313 03/27/24 0443  NA 127* 129* 127*  K 5.1 4.6 5.2*  CL 90* 91* 90*  CO2 23 26 24   GLUCOSE 86 73 170*  BUN 45* 25* 43*  CREATININE 8.83* 5.93* 7.88*  CALCIUM  8.6* 8.3* 8.2*  PHOS 5.2* 4.6 6.4*   Liver Function Tests: Recent Labs  Lab 03/22/24 0346 03/23/24 0231 03/25/24 0324 03/26/24 0313 03/27/24 0443  AST 29  --   --   --   --   ALT 19  --   --   --   --   ALKPHOS 59  --   --   --   --   BILITOT 0.6  --   --   --   --   PROT 7.3  --   --   --   --   ALBUMIN  3.7   < > 3.0* 2.8* 2.7*   < > = values in this interval not displayed.   No results for input(s): LIPASE, AMYLASE in the last 168 hours. CBC: Recent Labs  Lab 03/22/24 0346 03/23/24 0231 03/25/24 0324 03/26/24 0313 03/27/24 0443  WBC 5.1 4.9 6.8 6.6 5.0  NEUTROABS 3.2 3.3 4.8 4.5 3.2  HGB 10.7* 10.2* 10.1* 10.0* 9.4*  HCT 34.2* 31.0* 30.9* 30.0* 28.5*  MCV 98.6 93.4 95.1 93.8 94.4  PLT 218 237 261 284 259   Blood Culture    Component Value Date/Time   SDES BLOOD RIGHT FOREARM 12/30/2021 0717   SPECREQUEST  12/30/2021 0717     BOTTLES DRAWN AEROBIC AND ANAEROBIC Blood Culture adequate volume   CULT  12/30/2021 0717    NO GROWTH 5 DAYS Performed at [facility], [address] [place], [place] [address]    REPTSTATUS 01/04/2022 FINAL 12/30/2021 9282    Cardiac Enzymes: No results for input(s): CKTOTAL, CKMB, CKMBINDEX, TROPONINI in the last 168 hours. CBG: Recent Labs  Lab 03/25/24 1612 03/25/24 2126 03/26/24 0629 03/26/24 1106 03/26/24 1643  GLUCAP 129* 153* 73 110* 158*   Iron Studies: No results for input(s): IRON, TIBC, TRANSFERRIN, FERRITIN in the last 72 hours. @lablastinr3 @ Studies/Results: No results found. Medications:   amLODipine   10 mg Oral Daily   atorvastatin   40 mg Oral QHS   Chlorhexidine  Gluconate Cloth  6 each Topical Q0600   cloNIDine   0.1 mg Oral BID   clopidogrel   75 mg Oral Daily   heparin   5,000 Units Subcutaneous Q8H   hydrALAZINE   100 mg Oral Q8H  insulin  aspart  0-6 Units Subcutaneous TID AC & HS   losartan   100 mg Oral Daily   sodium chloride  flush  3 mL Intravenous Q12H   spironolactone   25 mg Oral Daily    Dialysis Orders:  MWF - [facility] 3hrs31min, BFR 450, DFR 800,  EDW 55.5kg, 2K/ 2.5Ca Heparin  None ordered Mircera 75 mcg q4wks - last 10/22 Hectorol  3mcg IV qHD  - last 10/22 Sensipar  30mg  with HD - last 10/22   Last Labs: Hgb 10.7, K 5.0, Ca 8.8, Alb 3.7  Assessment/Plan: 1.  Hypertensive urgency: With malignant hypertension upon presentation secondary to nonadherence with oral antihypertensive therapy.  Blood pressures remain elevated, last med adjustment was 03/23/24. Na is still low, will increase UF goal today and try to get fluid down further 2. ESRD: Typically on MWF dialysis schedule, HD today 3. Anemia: Hemoglobin and hematocrit level currently within acceptable range.  No overt blood loss and will defer ESA therapy at this time. 4. CKD-MBD: Corrected calcium  level and phosphorus level are both at goal,  continue current management with renal diet. 5.  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 6.  Cognitive impairment: Difficult to assess cognitive function per SLP note but reassuring to see that the patient acknowledges that he has difficulties with recall/taking his medications as prescribed.  Lucie Collet, PA-C 03/27/2024, 8:10 AM  [employer] Pager: [phone]

## 2024-03-27 NOTE — Progress Notes (Signed)
 [facility] Progress Note Patient: Shawn Collins FMW:[id number] DOB: 1950/11/28  DOA: 03/22/2024 DOS: the patient was seen and examined on 03/27/2024  Brief Hospital Course: Mr. Duross is a 73 yo male with PMH ESRD on HD, CAD, COPD, CVA, DM II, HTN, HLD who presented to the ER via EMS with elevated blood pressure. He has had 7 ER visits over the past 2 months all for uncontrolled blood pressure. He has been forgetful at home with possibly not taking medications.  There has been concern that he may not be able to continue living safely at home due to cognitive impairment. Presentation this time similar.  He thought he was checking glucose levels when in fact his blood pressure was found to be uncontrolled. He states he did go to dialysis this past Wednesday. HD center has also contacted the hospital due to concerns for patient returning safely at home.  He was admitted for further blood pressure control and ongoing dialysis during hospitalization.  PT, OT consulted as well as speech for cognitive evaluation.  Assessment and Plan: Hypertensive emergency Recurrent ED visits with severe range hypertension, chronic associated myocardial injury. Has predominantly systolic hypertension. Reports checking BP at home and reading high, no associated symptoms.  On initial eval BP max 255/90, MAP 146. HS trop 58 -> 48, EKG without acute ischemic changes. CT head no acute findings.  Continue home regimen along with PRN meds   Hypertensive encephalopathy. patient oriented in ER, but cognition certainly slowed; unable to really explain reasoning for why he misses meds at home HD center has also reached out with concerns about patient returning home safely and continuing to care for himself SLP consulted for cognitive assessment TOC consulted for placement of possible.  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  Establish with vascular surgery as outpatient   COPD Continue breathing treatments   CAD (coronary artery disease) s/p CABG 2018 continue plavix , ARB, and statin   Type 2 diabetes mellitus (HCC) - A1c 5% - SSI   History of CVA (cerebrovascular accident) - continue statin and plavix     Hyperlipidemia LDL goal <70 - continue statin   Underweight. Body mass index is 18.22 kg/m.  Placing the patient at high risk for poor outcome.   Subjective: No nausea no vomiting.  Seen on hemodialysis.  No acute complaint.  Physical Exam: Clear to auscultation. S1-S2 present Bowel sound present.  Nontender. No edema. Alert awake and oriented.  Data Reviewed: I have Reviewed nursing notes, Vitals, and Lab results. Since last encounter, pertinent lab results CBC BMP   . I have ordered test including CBC and BMP  .   Disposition: Status is: Inpatient Remains inpatient appropriate because: Monitor for improvement in mentation.  heparin  injection 5,000 Units Start: 03/22/24 1400   Family Communication: No at bedside Level of care: Med-Surg   Vitals:   03/27/24 1147 03/27/24 1148 03/27/24 1152 03/27/24 1700  BP: (!) 178/68 (!) 178/68 (!) 179/64 (!) 129/57  Pulse: 68 67 66 60  Resp: 13 14 14 16   Temp: 98.1 F (36.7 C)   98.3 F (36.8 C)  TempSrc:      SpO2: 98% 98% 98% 99%  Weight:  57.6 kg     Height:         Author: Yetta Blanch, MD 03/27/2024 6:04 PM  Please look on [email or web address] to find out who is on call.

## 2024-03-27 NOTE — Progress Notes (Signed)
 Occupational Therapy Treatment Patient Details Name: Shawn Collins MRN: [id number] DOB: 1950-10-28 Today's Date: 03/27/2024   History of present illness Shawn Collins is a 73 y.o. male admitted with hypertensive urgency/emergency, intermittent nonadherence with dialysis and antihypertensives, who presented due to uncontrolled hypertension.  Dry gangrene of left 5th and right 2nd toe. Hx of ESRD on MWF HD, CAD s/p CABG, CVA, COPD, hypertension, hyperlipidemia, recent frequent   OT comments  Pt was attempting to get breakfast why laying in the bed. He agreed to complete light hygiene at the EOB with cues on sequencing for UE and then was set up for breakfast. Transport then arrived to take pt to HD. Pt did make comments in session about how they do not feel right and confused. Patient will benefit from continued inpatient follow up therapy, <3 hours/day.       If plan is discharge home, recommend the following:  A little help with walking and/or transfers;A little help with bathing/dressing/bathroom;Assistance with cooking/housework;Assist for transportation;Help with stairs or ramp for entrance;Direct supervision/assist for medications management;Supervision due to cognitive status   Equipment Recommendations  Other (comment) (TBD at next venue)    Recommendations for Other Services      Precautions / Restrictions Precautions Precautions: Fall Restrictions Weight Bearing Restrictions Per Provider Order: No       Mobility Bed Mobility Overal bed mobility: Modified Independent Bed Mobility: Supine to Sit, Sit to Supine     Supine to sit: Supervision Sit to supine: Supervision   General bed mobility comments: increase in time    Transfers                   General transfer comment: pt was taking out to HD     Balance Overall balance assessment: Needs assistance Sitting-balance support: Feet supported Sitting balance-Leahy Scale: Good Sitting balance - Comments:  no BUE support and able to eat breakfast                                   ADL either performed or assessed with clinical judgement   ADL Overall ADL's : Needs assistance/impaired Eating/Feeding: Set up;Sitting   Grooming: Wash/dry hands;Wash/dry face;Set up;Sitting   Upper Body Bathing: Set up;Sitting       Upper Body Dressing : Set up;Sitting                          Extremity/Trunk Assessment Upper Extremity Assessment Upper Extremity Assessment: Overall WFL for tasks assessed            Vision   Vision Assessment?: Wears glasses for reading   Perception Perception Perception: Within Functional Limits   Praxis Praxis Praxis: WFL   Communication Communication Communication: No apparent difficulties Factors Affecting Communication: Reduced clarity of speech   Cognition Arousal: Alert Behavior During Therapy: WFL for tasks assessed/performed Cognition: Cognition impaired   Orientation impairments: Time, Situation Awareness: Online awareness impaired, Intellectual awareness intact Memory impairment (select all impairments): Short-term memory Attention impairment (select first level of impairment): Selective attention Executive functioning impairment (select all impairments): Organization, Sequencing, Problem solving OT - Cognition Comments: Pt reporting they feel confused but could not report on why.                 Following commands: Intact        Cueing   Cueing Techniques: Verbal cues  Exercises  Shoulder Instructions       General Comments      Pertinent Vitals/ Pain       Pain Assessment Pain Assessment: No/denies pain  Home Living                                          Prior Functioning/Environment              Frequency  Min 2X/week        Progress Toward Goals  OT Goals(current goals can now be found in the care plan section)  Progress towards OT goals: Progressing  toward goals  Acute Rehab OT Goals Patient Stated Goal: to feel better OT Goal Formulation: With patient Time For Goal Achievement: 04/06/24 Potential to Achieve Goals: Good ADL Goals Pt Will Perform Grooming: with supervision;standing Pt Will Perform Lower Body Bathing: with supervision;sit to/from stand Pt Will Perform Lower Body Dressing: with supervision;sit to/from stand Pt Will Transfer to Toilet: with supervision;ambulating;grab bars;regular height toilet Pt Will Perform Toileting - Clothing Manipulation and hygiene: with supervision;sit to/from stand Additional ADL Goal #1: Pt will verbalize need for use of RW for safety with transfers and mobility with no more than one questioning cue.  Plan      Co-evaluation                 AM-PAC OT 6 Clicks Daily Activity     Outcome Measure   Help from another person eating meals?: None Help from another person taking care of personal grooming?: A Little Help from another person toileting, which includes using toliet, bedpan, or urinal?: A Little Help from another person bathing (including washing, rinsing, drying)?: A Little Help from another person to put on and taking off regular upper body clothing?: A Little Help from another person to put on and taking off regular lower body clothing?: A Little 6 Click Score: 19    End of Session    OT Visit Diagnosis: Unsteadiness on feet (R26.81);Other abnormalities of gait and mobility (R26.89);Repeated falls (R29.6);Muscle weakness (generalized) (M62.81);Other symptoms and signs involving cognitive function   Activity Tolerance Patient tolerated treatment well   Patient Left in bed (pt was being transport to HD)   Nurse Communication Mobility status        Time: 9269-9250 OT Time Calculation (min): 19 min  Charges: OT General Charges $OT Visit: 1 Visit OT Treatments $Self Care/Home Management : 8-22 mins  Shawn POUR OTR/L  Acute Rehab Services  [phone] office  number   Shawn Berber 03/27/2024, 7:59 AM

## 2024-03-27 NOTE — Progress Notes (Signed)
 Received patient in bed to unit.  Alert and oriented.  Informed consent signed and in chart.   TX duration:3.5 hours  Patient tolerated well.  Transported back to the room  Alert, without acute distress.  Hand-off given to patient's nurse.   Access used: Left forearm fistula Access issues: none  Total UF removed: 3L   03/27/24 1147  Vitals  Temp 98.1 F (36.7 C)  BP (!) 178/68  Pulse Rate 68  ECG Heart Rate 69  Resp 13  Weight 57.6 kg  Oxygen Therapy  SpO2 98 %  O2 Device Room Air  During Treatment Monitoring  Duration of HD Treatment -hour(s) 3.5 hour(s)  HD Safety Checks Performed Yes  Intra-Hemodialysis Comments Tx completed  Post Treatment  Dialyzer Clearance Clear  Liters Processed 84  Fluid Removed (mL) 3000 mL  Tolerated HD Treatment Yes  Post-Hemodialysis Comments tolerated well  AVG/AVF Arterial Site Held (minutes) 7 minutes  AVG/AVF Venous Site Held (minutes) 7 minutes  Fistula / Graft Left Upper arm Arteriovenous vein graft  Placement Date/Time: 12/21/21 1103   Orientation: Left  Access Location: Upper arm  Access Type: Arteriovenous vein graft  Site Condition No complications  Fistula / Graft Assessment Present;Thrill;Bruit  Status Patent;Deaccessed  Drainage Description None     Camellia Brasil LPN Kidney Dialysis Unit

## 2024-03-28 DIAGNOSIS — I161 Hypertensive emergency: Secondary | ICD-10-CM | POA: Diagnosis not present

## 2024-03-28 LAB — CBC WITH DIFFERENTIAL/PLATELET
Abs Immature Granulocytes: 0.04 K/uL (ref 0.00–0.07)
Basophils Absolute: 0.1 K/uL (ref 0.0–0.1)
Basophils Relative: 1 %
Eosinophils Absolute: 0.1 K/uL (ref 0.0–0.5)
Eosinophils Relative: 2 %
HCT: 30.4 % — ABNORMAL LOW (ref 39.0–52.0)
Hemoglobin: 9.8 g/dL — ABNORMAL LOW (ref 13.0–17.0)
Immature Granulocytes: 1 %
Lymphocytes Relative: 15 %
Lymphs Abs: 0.7 K/uL (ref 0.7–4.0)
MCH: 30.6 pg (ref 26.0–34.0)
MCHC: 32.2 g/dL (ref 30.0–36.0)
MCV: 95 fL (ref 80.0–100.0)
Monocytes Absolute: 0.9 K/uL (ref 0.1–1.0)
Monocytes Relative: 19 %
Neutro Abs: 2.9 K/uL (ref 1.7–7.7)
Neutrophils Relative %: 62 %
Platelets: 255 K/uL (ref 150–400)
RBC: 3.2 MIL/uL — ABNORMAL LOW (ref 4.22–5.81)
RDW: 15.4 % (ref 11.5–15.5)
WBC: 4.7 K/uL (ref 4.0–10.5)
nRBC: 0 % (ref 0.0–0.2)

## 2024-03-28 LAB — GLUCOSE, CAPILLARY
Glucose-Capillary: 129 mg/dL — ABNORMAL HIGH (ref 70–99)
Glucose-Capillary: 147 mg/dL — ABNORMAL HIGH (ref 70–99)
Glucose-Capillary: 158 mg/dL — ABNORMAL HIGH (ref 70–99)
Glucose-Capillary: 144 mg/dL — ABNORMAL HIGH (ref 70–99)
Glucose-Capillary: 161 mg/dL — ABNORMAL HIGH (ref 70–99)

## 2024-03-28 LAB — RENAL FUNCTION PANEL
Albumin: 2.7 g/dL — ABNORMAL LOW (ref 3.5–5.0)
Anion gap: 14 (ref 5–15)
BUN: 39 mg/dL — ABNORMAL HIGH (ref 8–23)
CO2: 26 mmol/L (ref 22–32)
Calcium: 8 mg/dL — ABNORMAL LOW (ref 8.9–10.3)
Chloride: 89 mmol/L — ABNORMAL LOW (ref 98–111)
Creatinine, Ser: 6.97 mg/dL — ABNORMAL HIGH (ref 0.61–1.24)
GFR, Estimated: 8 mL/min — ABNORMAL LOW (ref >60)
Glucose, Bld: 112 mg/dL — ABNORMAL HIGH (ref 70–99)
Phosphorus: 5.8 mg/dL — ABNORMAL HIGH (ref 2.5–4.6)
Potassium: 5.2 mmol/L — ABNORMAL HIGH (ref 3.5–5.1)
Sodium: 129 mmol/L — ABNORMAL LOW (ref 135–145)

## 2024-03-28 LAB — MAGNESIUM: Magnesium: 1.9 mg/dL (ref 1.7–2.4)

## 2024-03-28 MED ORDER — CHLORHEXIDINE GLUCONATE CLOTH 2 % EX PADS: 6.0000 | MEDICATED_PAD | Freq: Every day | CUTANEOUS | Status: DC

## 2024-03-28 NOTE — Progress Notes (Signed)
 [facility] Progress Note   Subjective:    Seen and examined patient at bedside. He denies any current issues. Tolerated yesterday's HD with net UF 3L. Next HD 10/31.  Objective Vitals:   03/27/24 2129 03/28/24 0500 03/28/24 0821 03/28/24 1439  BP: 135/65 (!) 159/63 (!) 130/54 138/63  Pulse: 63 62 (!) 59 64  Resp:  18 18 18   Temp:  97.9 F (36.6 C) (!) 97.5 F (36.4 C)   TempSrc:  Oral    SpO2:  98% 99% 100%  Weight:      Height:       Physical Exam General: Alert male in NAD Heart: RRR, no murmurs, rubs or gallops Lungs: CTA bilaterally, respirations unlabored Abdomen: Soft, non-distended, +BS Extremities: No edema b/l lower extremities Dialysis Access:  AVF +t/b  Filed Weights   03/25/24 1208 03/27/24 0803 03/27/24 1147  Weight: 60 kg 60.6 kg 57.6 kg    Intake/Output Summary (Last 24 hours) at 03/28/2024 1535 Last data filed at 03/28/2024 1445 Gross per 24 hour  Intake 540 ml  Output 100 ml  Net 440 ml    Additional Objective Labs: Basic Metabolic Panel: Recent Labs  Lab 03/26/24 0313 03/27/24 0443 03/28/24 0324  NA 129* 127* 129*  K 4.6 5.2* 5.2*  CL 91* 90* 89*  CO2 26 24 26   GLUCOSE 73 170* 112*  BUN 25* 43* 39*  CREATININE 5.93* 7.88* 6.97*  CALCIUM  8.3* 8.2* 8.0*  PHOS 4.6 6.4* 5.8*   Liver Function Tests: Recent Labs  Lab 03/22/24 0346 03/23/24 0231 03/26/24 0313 03/27/24 0443 03/28/24 0324  AST 29  --   --   --   --   ALT 19  --   --   --   --   ALKPHOS 59  --   --   --   --   BILITOT 0.6  --   --   --   --   PROT 7.3  --   --   --   --   ALBUMIN  3.7   < > 2.8* 2.7* 2.7*   < > = values in this interval not displayed.   No results for input(s): LIPASE, AMYLASE in the last 168 hours. CBC: Recent Labs  Lab 03/23/24 0231 03/25/24 0324 03/26/24 0313 03/27/24 0443 03/28/24 0324  WBC 4.9 6.8 6.6 5.0 4.7  NEUTROABS 3.3 4.8 4.5 3.2 2.9  HGB 10.2* 10.1* 10.0* 9.4* 9.8*  HCT 31.0* 30.9* 30.0* 28.5* 30.4*  MCV  93.4 95.1 93.8 94.4 95.0  PLT 237 261 284 259 255   Blood Culture    Component Value Date/Time   SDES BLOOD RIGHT FOREARM 12/30/2021 0717   SPECREQUEST  12/30/2021 0717    BOTTLES DRAWN AEROBIC AND ANAEROBIC Blood Culture adequate volume   CULT  12/30/2021 0717    NO GROWTH 5 DAYS Performed at [facility], [address] [place], [place] [address]    REPTSTATUS 01/04/2022 FINAL 12/30/2021 9282    Cardiac Enzymes: No results for input(s): CKTOTAL, CKMB, CKMBINDEX, TROPONINI in the last 168 hours. CBG: Recent Labs  Lab 03/27/24 1237 03/27/24 1703 03/27/24 2001 03/28/24 0741 03/28/24 1220  GLUCAP 108* 209* 129* 147* 158*   Iron Studies: No results for input(s): IRON, TIBC, TRANSFERRIN, FERRITIN in the last 72 hours. Lab Results  Component Value Date   INR 1.2 01/10/2024   INR 1.2 12/30/2021   INR 1.1 12/18/2021   Studies/Results: No results found.  Medications:   amLODipine   10 mg Oral Daily   aspirin  EC  81 mg Oral Daily   atorvastatin   40 mg Oral QHS   Chlorhexidine  Gluconate Cloth  6 each Topical Q0600   cloNIDine   0.1 mg Oral BID   clopidogrel   75 mg Oral Daily   feeding supplement (NEPRO CARB STEADY)  237 mL Oral TID BM   heparin   5,000 Units Subcutaneous Q8H   hydrALAZINE   100 mg Oral Q8H   insulin  aspart  0-6 Units Subcutaneous TID AC & HS   losartan   100 mg Oral Daily   sodium chloride  flush  3 mL Intravenous Q12H   spironolactone   25 mg Oral Daily    Dialysis Orders: MWF - [facility] 3hrs49min, BFR 450, DFR 800,  EDW 55.5kg, 2K/ 2.5Ca Heparin  None ordered Mircera 75 mcg q4wks - last 10/22 Hectorol  3mcg IV qHD  - last 10/22 Sensipar  30mg  with HD - last 10/22  Assessment/Plan: 1.  Hypertensive urgency: With malignant hypertension upon presentation secondary to nonadherence with oral antihypertensive therapy.  Blood pressures remain elevated, last med adjustment was 03/23/24. Na is still low, will increase UF  goal as tolerated and try to get fluid down further. Last Na 129. 2. ESRD: Typically on MWF dialysis schedule, next HD 10/31 3. Anemia: Hemoglobin and hematocrit level currently within acceptable range.  No overt blood loss and will defer ESA therapy at this time. 4. CKD-MBD: Corrected calcium  level and phosphorus level are both at goal, continue current management with renal diet. 5.  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 6.  Cognitive impairment: Difficult to assess cognitive function per SLP note but reassuring to see that the patient acknowledges that he has difficulties with recall/taking his medications as prescribed.  Charmaine Piety, NP [facility] 03/28/2024,3:35 PM  LOS: 5 days

## 2024-03-28 NOTE — Progress Notes (Signed)
 Physical Therapy Treatment Patient Details Name: Shawn Collins MRN: [id number] DOB: 1950-09-22 Today's Date: 03/28/2024   History of Present Illness Shawn Collins is a 73 y.o. male admitted with hypertensive urgency/emergency, intermittent nonadherence with dialysis and antihypertensives, who presented due to uncontrolled hypertension.  Dry gangrene of left 5th and right 2nd toe. Hx of ESRD on MWF HD, CAD s/p CABG, CVA, COPD, hypertension, hyperlipidemia, recent frequent    PT Comments  Pt received in bed, agreeable to participation in therapy. He required supervision bed mobility, min assist transfers, and CGA amb 450' with RW. Pt in recliner with feet elevated at end of session. Current POC remains appropriate.     If plan is discharge home, recommend the following: Assistance with cooking/housework;Assist for transportation;Supervision due to cognitive status;Help with stairs or ramp for entrance   Can travel by private vehicle     Yes  Equipment Recommendations  None recommended by PT    Recommendations for Other Services       Precautions / Restrictions Precautions Precautions: Fall Recall of Precautions/Restrictions: Impaired     Mobility  Bed Mobility   Bed Mobility: Supine to Sit     Supine to sit: Supervision, HOB elevated, Used rails     General bed mobility comments: increased time    Transfers Overall transfer level: Needs assistance Equipment used: Rolling walker (2 wheels) Transfers: Sit to/from Stand Sit to Stand: Min assist           General transfer comment: cues for sequencing, assist to power up    Ambulation/Gait Ambulation/Gait assistance: Contact guard assist Gait Distance (Feet): 450 Feet Assistive device: Rolling walker (2 wheels) Gait Pattern/deviations: Step-through pattern, Trunk flexed, Decreased stride length Gait velocity: decreased Gait velocity interpretation: 1.31 - 2.62 ft/sec, indicative of limited community ambulator    General Gait Details: cues for proximity to RW and posture. Directional cues needed.   Stairs             Wheelchair Mobility     Tilt Bed    Modified Rankin (Stroke Patients Only)       Balance Overall balance assessment: Needs assistance Sitting-balance support: Feet supported, No upper extremity supported Sitting balance-Leahy Scale: Good     Standing balance support: No upper extremity supported, During functional activity, Bilateral upper extremity supported Standing balance-Leahy Scale: Poor                              Communication Communication Communication: No apparent difficulties  Cognition Arousal: Alert Behavior During Therapy: WFL for tasks assessed/performed   PT - Cognitive impairments: History of cognitive impairments                         Following commands: Intact      Cueing Cueing Techniques: Verbal cues  Exercises      General Comments        Pertinent Vitals/Pain Pain Assessment Pain Assessment: Faces Faces Pain Scale: Hurts little more Pain Location: toes Pain Descriptors / Indicators: Discomfort, Grimacing Pain Intervention(s): Monitored during session, Repositioned    Home Living                          Prior Function            PT Goals (current goals can now be found in the care plan section) Acute Rehab PT Goals Patient Stated Goal:  get stronger Progress towards PT goals: Progressing toward goals    Frequency    Min 2X/week      PT Plan      Co-evaluation              AM-PAC PT 6 Clicks Mobility   Outcome Measure  Help needed turning from your back to your side while in a flat bed without using bedrails?: None Help needed moving from lying on your back to sitting on the side of a flat bed without using bedrails?: A Little Help needed moving to and from a bed to a chair (including a wheelchair)?: A Little Help needed standing up from a chair using your  arms (e.g., wheelchair or bedside chair)?: A Little Help needed to walk in hospital room?: A Little Help needed climbing 3-5 steps with a railing? : A Lot 6 Click Score: 18    End of Session Equipment Utilized During Treatment: Gait belt Activity Tolerance: Patient tolerated treatment well Patient left: in chair;with call bell/phone within reach Nurse Communication: Mobility status PT Visit Diagnosis: Muscle weakness (generalized) (M62.81);Difficulty in walking, not elsewhere classified (R26.2)     Time: 9243-9179 PT Time Calculation (min) (ACUTE ONLY): 24 min  Charges:    $Gait Training: 23-37 mins PT General Charges $$ ACUTE PT VISIT: 1 Visit                     Sari MATSU., PT  Office # [phone]    Shawn Collins 03/28/2024, 9:05 AM

## 2024-03-28 NOTE — Progress Notes (Signed)
 Speech Language Pathology Treatment:    Patient Details Name: Shawn Collins MRN: [id number] DOB: Nov 16, 1950 Today's Date: 03/28/2024 Time: 8957-8885 SLP Time Calculation (min) (ACUTE ONLY): 32 min  Assessment / Plan / Recommendation Clinical Impression  Sitting in recliner and receptive to ST. Pt stated he feels that his thinking/memory has decreased - SLP suspects may be at or close to baseline. States forgets to take meds and has been shown how to use pill box but doesn't use or recall learning how to use phone alarm as strategy several years ago during hospital admission. Pt receptive to [employer] which SLP made. Pt recalled and wrote activity earlier with PT accurately, activity in ST and plan for discharge to SNF (cues needed as he thought he was discharging to rooming house) as discussed earlier. He problem solved filling out dates in notebook and recalled tomorrow is Halloween. Encouraged to write daily activity, information he wants to recall or questions for staff and refer to it. Pt stated he would use but question ability to initiate. Stated he would be receptive to using pill box if/once discharged from rehab and attempted activity using mock (picture) of completed pill box for problem solving. He needed max-total cues and was evident this task was too difficult for him. ST will follow up once more while here however will likely recommend defer further tx to SNF if needed as he may be at baseline.   HPI HPI: Shawn Collins is a 73 y.o. male admitted with hypertensive urgency/emergency, intermittent nonadherence with dialysis and antihypertensives, who presented due to uncontrolled hypertension.  Hx of ESRD on MWF HD, CAD s/p CABG, CVA, COPD, hypertension, hyperlipidemia, recent frequent      SLP Plan  Continue with current plan of care          Recommendations   SNF                  Oral care BID   Frequent or constant Supervision/Assistance Cognitive communication  deficit (M58.158)     Continue with current plan of care     Dustin Olam Bull  03/28/2024, 11:18 AM

## 2024-03-28 NOTE — Progress Notes (Signed)
 [facility] PROGRESS NOTE Patient: Shawn Collins FMW:[id number]   PCP: Shelda Atlas, MD DOB: December 13, 1950   DOA: 03/22/2024   DOS: 03/28/2024   Length of stay:  5   Subjective: Denies any acute complaint.  No nausea vomiting but no fever no chills.  Objective:   Vitals:   03/28/24 0500 03/28/24 0821 03/28/24 1439 03/28/24 1704  BP: (!) 159/63 (!) 130/54 138/63 (!) 146/59  Pulse: 62 (!) 59 64 67  Resp: 18 18 18 18   Temp: 97.9 F (36.6 C) (!) 97.5 F (36.4 C)  98.2 F (36.8 C)  TempSrc: Oral     SpO2: 98% 99% 100% 98%  Weight:      Height:        Clear to auscultation protrusion is present present No asterixis. Orthotopic and oriented x 2. Assessment and plan for Active Issues Medically stable.  Awaiting placement.  Brief history and detailed Assessment and plan for other problems.  Shawn Collins is a 73 yo male with PMH ESRD on HD, CAD, COPD, CVA, DM II, HTN, HLD who presented to the ER via EMS with elevated blood pressure. He has had 7 ER visits over the past 2 months all for uncontrolled blood pressure. He has been forgetful at home with possibly not taking medications.  There has been concern that he may not be able to continue living safely at home due to cognitive impairment. Presentation this time similar.  He thought he was checking glucose levels when in fact his blood pressure was found to be uncontrolled. He states he did go to dialysis this past Wednesday. HD center has also contacted the hospital due to concerns for patient returning safely at home.  He was admitted for further blood pressure control and ongoing dialysis during hospitalization.  PT, OT consulted as well as speech for cognitive evaluation.  Assessment and Plan: Hypertensive emergency Recurrent ED visits with severe range hypertension, chronic associated myocardial injury. Has predominantly systolic hypertension. Reports checking BP at home and reading high, no associated symptoms.  On  initial eval BP max 255/90, MAP 146. HS trop 58 -> 48, EKG without acute ischemic changes. CT head no acute findings.  Continue home regimen along with PRN meds   Hypertensive encephalopathy. patient oriented in ER, but cognition certainly slowed; unable to really explain reasoning for why he misses meds at home HD center has also reached out with concerns about patient returning home safely and continuing to care for himself SLP consulted for cognitive assessment TOC consulted for placement of possible.  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  Establish with vascular surgery as outpatient   COPD Continue breathing treatments   CAD (coronary artery disease) s/p CABG 2018 continue plavix , ARB, and statin   Type 2 diabetes mellitus (HCC) - A1c 5% - SSI   History of CVA (cerebrovascular accident) - continue statin and plavix     Hyperlipidemia LDL goal <70 - continue statin   Underweight. Body mass index is 18.22 kg/m.  Placing the patient at high risk for poor outcome.    Author: Yetta Blanch, MD [facility] 03/28/2024 6:02 PM   If 7PM-7AM, please contact night-coverage at [email or web address]

## 2024-03-28 NOTE — Plan of Care (Signed)
  Problem: Education: Goal: Ability to describe self-care measures that may prevent or decrease complications (Diabetes Survival Skills Education) will improve Outcome: Progressing   Problem: Coping: Goal: Ability to adjust to condition or change in health will improve Outcome: Progressing   Problem: Nutritional: Goal: Maintenance of adequate nutrition will improve Outcome: Progressing   OOB to chair all morning

## 2024-03-28 NOTE — Plan of Care (Signed)
  Problem: Nutritional: Goal: Maintenance of adequate nutrition will improve Outcome: Progressing   Problem: Skin Integrity: Goal: Risk for impaired skin integrity will decrease Outcome: Progressing   Problem: Activity: Goal: Risk for activity intolerance will decrease Outcome: Progressing   Problem: Nutrition: Goal: Adequate nutrition will be maintained Outcome: Progressing   

## 2024-03-29 DIAGNOSIS — I161 Hypertensive emergency: Secondary | ICD-10-CM | POA: Diagnosis not present

## 2024-03-29 LAB — CBC WITH DIFFERENTIAL/PLATELET
Abs Immature Granulocytes: 0.05 K/uL (ref 0.00–0.07)
Basophils Absolute: 0 K/uL (ref 0.0–0.1)
Basophils Relative: 1 %
Eosinophils Absolute: 0.1 K/uL (ref 0.0–0.5)
Eosinophils Relative: 2 %
HCT: 29.1 % — ABNORMAL LOW (ref 39.0–52.0)
Hemoglobin: 9.3 g/dL — ABNORMAL LOW (ref 13.0–17.0)
Immature Granulocytes: 1 %
Lymphocytes Relative: 17 %
Lymphs Abs: 0.8 K/uL (ref 0.7–4.0)
MCH: 30.7 pg (ref 26.0–34.0)
MCHC: 32 g/dL (ref 30.0–36.0)
MCV: 96 fL (ref 80.0–100.0)
Monocytes Absolute: 0.8 K/uL (ref 0.1–1.0)
Monocytes Relative: 18 %
Neutro Abs: 2.8 K/uL (ref 1.7–7.7)
Neutrophils Relative %: 61 %
Platelets: 236 K/uL (ref 150–400)
RBC: 3.03 MIL/uL — ABNORMAL LOW (ref 4.22–5.81)
RDW: 15.8 % — ABNORMAL HIGH (ref 11.5–15.5)
WBC: 4.5 K/uL (ref 4.0–10.5)
nRBC: 0 % (ref 0.0–0.2)

## 2024-03-29 LAB — RENAL FUNCTION PANEL
Albumin: 2.8 g/dL — ABNORMAL LOW (ref 3.5–5.0)
Anion gap: 14 (ref 5–15)
BUN: 70 mg/dL — ABNORMAL HIGH (ref 8–23)
CO2: 24 mmol/L (ref 22–32)
Calcium: 8.4 mg/dL — ABNORMAL LOW (ref 8.9–10.3)
Chloride: 89 mmol/L — ABNORMAL LOW (ref 98–111)
Creatinine, Ser: 8.74 mg/dL — ABNORMAL HIGH (ref 0.61–1.24)
GFR, Estimated: 6 mL/min — ABNORMAL LOW (ref >60)
Glucose, Bld: 190 mg/dL — ABNORMAL HIGH (ref 70–99)
Phosphorus: 5.5 mg/dL — ABNORMAL HIGH (ref 2.5–4.6)
Potassium: 5.4 mmol/L — ABNORMAL HIGH (ref 3.5–5.1)
Sodium: 127 mmol/L — ABNORMAL LOW (ref 135–145)

## 2024-03-29 LAB — MAGNESIUM: Magnesium: 2.2 mg/dL (ref 1.7–2.4)

## 2024-03-29 LAB — GLUCOSE, CAPILLARY: Glucose-Capillary: 83 mg/dL (ref 70–99)

## 2024-03-29 MED ORDER — NEPRO/CARBSTEADY PO LIQD: 237.0000 mL | Freq: Three times a day (TID) | ORAL | Status: DC

## 2024-03-29 MED ORDER — CLONIDINE HCL 0.1 MG PO TABS: 0.1000 mg | ORAL_TABLET | Freq: Two times a day (BID) | ORAL | 0 refills | Status: DC

## 2024-03-29 NOTE — Progress Notes (Signed)
 [facility] Progress Note   Subjective:   Seen at start of HD. Reports he felt bad last HD but still feels like he has excess fluid on, admits to eating a lot of ice. UF with HD as tolerated. No SOB at present.   Objective Vitals:   03/29/24 0900 03/29/24 0930 03/29/24 1000 03/29/24 1030  BP: (!) 127/58 (!) 137/54 (!) 146/61 (!) 150/60  Pulse: (!) 58 61 60 (!) 57  Resp: 11 13 (!) 9 13  Temp:      TempSrc:      SpO2: 97% 99% 99% 99%  Weight:      Height:       Physical Exam General: Alert male in NAD Heart: RRR, no murmurs, rubs or gallops Lungs: CTA bilaterally, respirations unlabored Abdomen: Soft, non-distended, +BS Extremities: No edema b/l lower extremities Dialysis Access:  AVF +t/b  Additional Objective Labs: Basic Metabolic Panel: Recent Labs  Lab 03/27/24 0443 03/28/24 0324 03/29/24 0231  NA 127* 129* 127*  K 5.2* 5.2* 5.4*  CL 90* 89* 89*  CO2 24 26 24   GLUCOSE 170* 112* 190*  BUN 43* 39* 70*  CREATININE 7.88* 6.97* 8.74*  CALCIUM  8.2* 8.0* 8.4*  PHOS 6.4* 5.8* 5.5*   Liver Function Tests: Recent Labs  Lab 03/27/24 0443 03/28/24 0324 03/29/24 0231  ALBUMIN  2.7* 2.7* 2.8*   No results for input(s): LIPASE, AMYLASE in the last 168 hours. CBC: Recent Labs  Lab 03/25/24 0324 03/26/24 0313 03/27/24 0443 03/28/24 0324 03/29/24 0231  WBC 6.8 6.6 5.0 4.7 4.5  NEUTROABS 4.8 4.5 3.2 2.9 2.8  HGB 10.1* 10.0* 9.4* 9.8* 9.3*  HCT 30.9* 30.0* 28.5* 30.4* 29.1*  MCV 95.1 93.8 94.4 95.0 96.0  PLT 261 284 259 255 236   Blood Culture    Component Value Date/Time   SDES BLOOD RIGHT FOREARM 12/30/2021 0717   SPECREQUEST  12/30/2021 0717    BOTTLES DRAWN AEROBIC AND ANAEROBIC Blood Culture adequate volume   CULT  12/30/2021 0717    NO GROWTH 5 DAYS Performed at [facility], [address] [place], [place] [address]    REPTSTATUS 01/04/2022 FINAL 12/30/2021 9282    Cardiac Enzymes: No results for input(s): CKTOTAL,  CKMB, CKMBINDEX, TROPONINI in the last 168 hours. CBG: Recent Labs  Lab 03/27/24 2001 03/28/24 0741 03/28/24 1220 03/28/24 1631 03/28/24 2027  GLUCAP 129* 147* 158* 144* 161*   Iron Studies: No results for input(s): IRON, TIBC, TRANSFERRIN, FERRITIN in the last 72 hours. @lablastinr3 @ Studies/Results: No results found. Medications:   amLODipine   10 mg Oral Daily   aspirin  EC  81 mg Oral Daily   atorvastatin   40 mg Oral QHS   cloNIDine   0.1 mg Oral BID   clopidogrel   75 mg Oral Daily   feeding supplement (NEPRO CARB STEADY)  237 mL Oral TID BM   heparin   5,000 Units Subcutaneous Q8H   hydrALAZINE   100 mg Oral Q8H   insulin  aspart  0-6 Units Subcutaneous TID AC & HS   losartan   100 mg Oral Daily   sodium chloride  flush  3 mL Intravenous Q12H   spironolactone   25 mg Oral Daily    [facility]:  [facility] BFR [facility], DFR 800,  EDW 55.5kg, 2K/ 2.5Ca Heparin  None ordered Mircera 75 mcg q4wks - last 10/22 Hectorol  3mcg IV qHD  - last 10/22 Sensipar  30mg  with HD - last 10/22  Assessment/Plan:  1.  Hypertensive urgency: With malignant  hypertension upon presentation secondary to nonadherence with oral antihypertensive therapy.  Blood pressures remain elevated, last med adjustment was 03/23/24. Na is still low, will increase UF goal as tolerated and try to get fluid down further. Last Na 127. Reminded patient not to eat ice chips 2. ESRD: Typically on MWF dialysis schedule, next HD 10/31 3. Anemia: Hgb 9.3. Likely d/c today, resume ESA outpatient 4. CKD-MBD: Corrected calcium  level and phosphorus level are both at goal, continue current management with renal diet. 5.  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 6.  Cognitive impairment: Difficult to assess cognitive function per SLP note but reassuring to see that the  patient acknowledges that he has difficulties with recall/taking his medications as prescribed.  Lucie Collet, PA-C 03/29/2024, 10:51 AM  [facility] Pager: [phone]

## 2024-03-29 NOTE — Progress Notes (Signed)
 2.4 liters ultrafiltration, condition stable, and report was given to the RN assuming care.

## 2024-03-29 NOTE — Progress Notes (Signed)
 DISCHARGE NOTE HOME Trigger Frasier to be discharged to [facility] per MD order. Discussed prescriptions and follow up appointments with the patient. Prescriptions given to patient; medication list explained in detail. Patient verbalized understanding.  Skin clean, dry and intact without evidence of skin break down, no evidence of skin tears noted. IV catheter discontinued intact. Site without signs and symptoms of complications. Dressing and pressure applied. Pt denies pain at the site currently. No complaints noted.  Patient free of lines, drains, and wounds.   An After Visit Summary (AVS) was printed and given to the patient. Patient escorted via wheelchair to d/c lounge. Report called to [facility] and family will transport there.  Sefora Tietje A Proctor-Gann, RN

## 2024-03-29 NOTE — Progress Notes (Signed)
 Ride is here pt transported directly to family member. Not d/c lounge.

## 2024-03-29 NOTE — Progress Notes (Signed)
 Advised by CSW that pt will d/c to [facility] today. Contacted [facility] to advise clinic of pt's d/c to snf today and that pt should resume care on Monday. Clinic provided snf name and HD arrangements added to pt's AVS.   Randine Mungo Dialysis Navigator [phone]

## 2024-03-29 NOTE — Progress Notes (Signed)
 Dr. Tobie filled out MOST form for pt and it was sent in d/c envelope.

## 2024-03-29 NOTE — TOC Transition Note (Addendum)
 Transition of Care [facility]) - Discharge Note   Patient Details  Name: Shawn Collins MRN: [id number] Date of Birth: 15-Aug-1950  [facility] CM/SW Contact:  Lendia Dais, LCSWA Phone Number: 03/29/2024, 2:36 PM   Clinical Narrative: Pt is discharging to [facility] RN report to [phone]. Pt did not have a PT rec of [facility] and will be transported by pt's friend [name]. [name] stated she would be here by 1530-1600.  No further TOC needs.    Final next level of care: Skilled Nursing Facility Barriers to Discharge: Barriers Resolved   Patient Goals and CMS Choice            Discharge Placement                Patient to be transferred to facility by: [name] [name] Name of family member notified: [name] (niece) Patient and family notified of of transfer: 03/29/24  Discharge Plan and Services Additional resources added to the After Visit Summary for   In-house Referral: Clinical Social Work   Post Acute Care Choice: Skilled Nursing Facility                               Social Drivers of Health (SDOH) Interventions SDOH Screenings   Food Insecurity: No Food Insecurity (03/22/2024)  Housing: Low Risk  (03/22/2024)  Transportation Needs: No Transportation Needs (03/22/2024)  Utilities: Not At Risk (03/22/2024)  Social Connections: Moderately Isolated (03/22/2024)  Tobacco Use: Medium Risk (03/22/2024)     Readmission Risk Interventions    07/07/2022    9:49 AM  Readmission Risk Prevention Plan  Transportation Screening Complete  PCP or Specialist Appt within 3-5 Days Complete  HRI or Home Care Consult Complete  Social Work Consult for Recovery Care Planning/Counseling --  Palliative Care Screening Not Applicable  Medication Review [employer]) Complete

## 2024-03-29 NOTE — Discharge Summary (Signed)
 Physician Discharge Summary   Patient: Shawn Collins MRN: [id number] DOB: 09/20/1950  Admit date:     03/22/2024  Discharge date: 03/29/24  Discharge Physician: Yetta Blanch  PCP: Shelda Atlas, MD  Recommendations at discharge: Follow-up with PCP in 1 week. Follow-up with nephrology for hemodialysis.   Contact information for follow-up providers     Shelda Atlas, MD. Schedule an appointment as soon as possible for a visit in 1 week(s).   Specialty: Internal Medicine Contact information: [address] [place] [address] [phone]              Contact information for after-discharge care     Destination     [facility] [place], [place] .   Service: Skilled Nursing Contact information: [address] [place]  [address] [phone]                    Hospital Course: Mr. Nudelman is a 73 yo male with PMH ESRD on HD, CAD, COPD, CVA, DM II, HTN, HLD who presented to the ER via EMS with elevated blood pressure. He has had 7 ER visits over the past 2 months all for uncontrolled blood pressure. He has been forgetful at home with possibly not taking medications.  There has been concern that he may not be able to continue living safely at home due to cognitive impairment. Presentation this time similar.  He thought he was checking glucose levels when in fact his blood pressure was found to be uncontrolled. He states he did go to dialysis this past Wednesday. HD center has also contacted the hospital due to concerns for patient returning safely at home.  He was admitted for further blood pressure control and ongoing dialysis during hospitalization.  PT, OT consulted as well as speech for cognitive evaluation.  Assessment and Plan: Hypertensive emergency Recurrent ED visits with severe range hypertension, chronic associated myocardial injury. Has predominantly systolic hypertension. Reports checking BP at home and reading high, no  associated symptoms.  On initial eval BP max 255/90, MAP 146. HS trop 58 -> 48, EKG without acute ischemic changes. CT head no acute findings.  Continue home regimen along with newly added clonidine    Hypertensive encephalopathy. patient oriented in ER, but cognition certainly slowed; unable to really explain reasoning for why he misses meds at home HD center has also reached out with concerns about patient returning home safely and continuing to care for himself SLP consulted for cognitive assessment TOC consulted for placement of possible.  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  Establish with vascular surgery as outpatient   COPD Continue breathing treatments   CAD (coronary artery disease) s/p CABG 2018 continue plavix , ARB, and statin   Type 2 diabetes mellitus (HCC) - A1c 5% Was on sliding scale here.    History of CVA (cerebrovascular accident) - continue statin and plavix     Hyperlipidemia LDL goal <70 - continue statin   Underweight. Body mass index is 19.87 kg/m.  Placing the patient at high risk for poor outcome.   Consultants:  Nephrology   Procedures performed:  none  DISCHARGE MEDICATION: Allergies as of 03/29/2024   No Known Allergies  Medication List     STOP taking these medications    oxyCODONE  5 MG immediate release tablet Commonly known as: Roxicodone    sevelamer  carbonate 800 MG tablet Commonly known as: RENVELA        TAKE these medications    amLODipine  10 MG tablet Commonly known as: NORVASC  Take 1 tablet (10 mg total) by mouth at bedtime.   aspirin  81 MG chewable tablet Chew 1 tablet  (81 mg total) by mouth daily.   atorvastatin  40 MG tablet Commonly known as: LIPITOR  Take 1 tablet (40 mg total) by mouth at bedtime.   cloNIDine  0.1 MG tablet Commonly known as: CATAPRES  Take 1 tablet (0.1 mg total) by mouth 2 (two) times daily.   clopidogrel  75 MG tablet Commonly known as: PLAVIX  Take 75 mg by mouth daily.   feeding supplement (NEPRO CARB STEADY) Liqd Take 237 mLs by mouth 3 (three) times daily between meals.   hydrALAZINE  50 MG tablet Commonly known as: APRESOLINE  Take 1 tablet (50 mg total) by mouth every 8 (eight) hours.   losartan  100 MG tablet Commonly known as: COZAAR  Take 1 tablet (100 mg total) by mouth daily.   nitroGLYCERIN  0.4 MG SL tablet Commonly known as: NITROSTAT  Place 0.4 mg under the tongue every 5 (five) minutes as needed for chest pain.   spironolactone  25 MG tablet Commonly known as: ALDACTONE  Take 25 mg by mouth daily.       Disposition: SNF Diet recommendation: Renal diet  Discharge Exam: Vitals:   03/29/24 1000 03/29/24 1030 03/29/24 1100 03/29/24 1130  BP: (!) 146/61 (!) 150/60 139/60 (!) 147/58  Pulse: 60 (!) 57 (!) 58 (!) 59  Resp: (!) 9 13 11 15   Temp:      TempSrc:      SpO2: 99% 99% 98% 99%  Weight:      Height:       Clear to auscultation. S1-S2 present Bowel sound present Nontender.  Filed Weights   03/27/24 0803 03/27/24 1147 03/29/24 0849  Weight: 60.6 kg 57.6 kg 62.8 kg   Condition at discharge: stable  The results of significant diagnostics from this hospitalization (including imaging, microbiology, ancillary and laboratory) are listed below for reference.   Imaging Studies: VAS US  ABI WITH/WO TBI Result Date: 03/22/2024  LOWER EXTREMITY DOPPLER STUDY Patient Name:  Shawn Collins  Date of Exam:   03/22/2024 Medical Rec #: [id number]      Accession #:    [id number] Date of Birth: 12-Feb-1951      Patient Gender: M Patient Age:   73 years Exam Location:  [facility] Procedure:      VAS US  ABI  WITH/WO TBI Referring Phys: DORN DAWSON --------------------------------------------------------------------------------  Indications: Gangrene. Dry extremity. High Risk Factors: Hypertension, hyperlipidemia, Diabetes, past history of                    smoking, coronary artery disease, prior CVA.  Vascular Interventions: S/P CABG. Limitations: Today's exam was limited due to involuntary patient movement and              patient positioning. Comparison Study: No prior exam. Performing Technologist: Edilia Elden Appl  Examination Guidelines: A complete evaluation includes at minimum, Doppler waveform signals and systolic blood pressure reading at the level of bilateral brachial, anterior tibial, and posterior tibial arteries, when vessel segments are accessible. Bilateral testing is considered an integral part of a complete examination. Photoelectric Plethysmograph (PPG) waveforms and toe systolic pressure readings are included as  required and additional duplex testing as needed. Limited examinations for reoccurring indications may be performed as noted.  ABI Findings: +---------+------------------+-----+----------+--------+ Right    Rt Pressure (mmHg)IndexWaveform  Comment  +---------+------------------+-----+----------+--------+ Brachial 181                    triphasic          +---------+------------------+-----+----------+--------+ PTA      254               1.40 monophasic         +---------+------------------+-----+----------+--------+ DP       127               0.70 monophasic         +---------+------------------+-----+----------+--------+ Great Toe114               0.63 Abnormal           +---------+------------------+-----+----------+--------+ +---------+------------------+-----+----------+-----------+ Left     Lt Pressure (mmHg)IndexWaveform  Comment     +---------+------------------+-----+----------+-----------+ Brachial                                   Restricted. +---------+------------------+-----+----------+-----------+ PTA      219               1.21 monophasic            +---------+------------------+-----+----------+-----------+ DP       129               0.71 monophasic            +---------+------------------+-----+----------+-----------+ Great Toe101               0.56 Abnormal              +---------+------------------+-----+----------+-----------+ +-------+-----------+-----------+------------+------------+ ABI/TBIToday's ABIToday's TBIPrevious ABIPrevious TBI +-------+-----------+-----------+------------+------------+ Right  0.71       0.63                                +-------+-----------+-----------+------------+------------+ Left   1.21       0.63                                +-------+-----------+-----------+------------+------------+ Arterial wall calcification precludes accurate ankle pressures and ABIs. Falsely elevated velocities in the left lower extremity.  Summary: Right: Resting right ankle-brachial index indicates noncompressible right lower extremity arteries. The right toe-brachial index is abnormal.  Left: Resting left ankle-brachial index is within normal range. The left toe-brachial index is abnormal.  *See table(s) above for measurements and observations.  Electronically signed by Fonda Rim on 03/22/2024 at 6:53:58 PM.    Final    DG CHEST PORT 1 VIEW Result Date: 03/22/2024 EXAM: 1 VIEW(S) XRAY OF THE CHEST 03/22/2024 06:24:58 AM COMPARISON: 03/18/2024 CLINICAL HISTORY: Severe hypertension [id number]. home BIB [facility] who called because patient thought his CBG monitor was reading as high but it was his BP that was elevated. FINDINGS: LUNGS AND PLEURA: No focal pulmonary opacity. No pulmonary edema. No pleural effusion. No pneumothorax. HEART AND MEDIASTINUM: Aortic arch calcification. CABG markers noted. BONES AND SOFT TISSUES: Sternotomy wires noted. No acute osseous abnormality.  IMPRESSION: 1. No acute cardiopulmonary process identified. Electronically signed by: Waddell Calk MD 03/22/2024 07:07 AM EDT RP Workstation: [id number]   CT HEAD WO CONTRAST ( ) Result Date: 03/22/2024 EXAM:  CT HEAD WITHOUT CONTRAST 03/22/2024 04:48:24 AM TECHNIQUE: CT of the head was performed without the administration of intravenous contrast. Automated exposure control, iterative reconstruction, and/or weight based adjustment of the mA/kV was utilized to reduce the radiation dose to as low as reasonably achievable. COMPARISON: MRI 12/23/2021. Head CT 02/14/2024. CLINICAL HISTORY: Mental status change, unknown cause. Patient arrives home BIB [facility] who called because patient thought his CBG monitor was reading as high but it was his BP that was elevated. Patient was confused, Aox3. Dialysis patient going MWF, fistula to L forearm. Patient reporting that he called EMS as well because when I'm by myself I feel like I'm dying. FINDINGS: BRAIN AND VENTRICLES: No acute hemorrhage. No evidence of acute infarct. No hydrocephalus. No extra-axial collection. No mass effect or midline shift. Advanced calcified atherosclerosis. Stable cerebral volume. Patchy chronic white matter disease, asymmetric chronic involvement of the left deep white matter capsules stable from previous MRI. Stable mild deep gray nuclei heterogeneity elsewhere. Stable faint basal ganglia vascular calcifications. No suspicious intracranial vascular hyperdensity. ORBITS: No acute abnormality. SINUSES: No acute abnormality. SOFT TISSUES AND SKULL: No acute soft tissue abnormality. No skull fracture. IMPRESSION: 1. No acute intracranial abnormality. 2. 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 Electronically signed by: Helayne Hurst MD 03/22/2024 05:32 AM EDT RP Workstation: [id number]   DG Chest Portable 1 View Result Date: 03/18/2024 EXAM: 1 VIEW(S) XRAY OF THE CHEST 03/18/2024 05:54:36 AM COMPARISON: 02/14/2024 CLINICAL HISTORY:  Near Syncope. Near syncope. FINDINGS: LUNGS AND PLEURA: No focal pulmonary opacity. No pulmonary edema. No pleural effusion. No pneumothorax. HEART AND MEDIASTINUM: CABG markers noted. Sternotomy wires noted. No acute abnormality of the cardiac and mediastinal silhouettes. BONES AND SOFT TISSUES: No acute osseous abnormality. IMPRESSION: 1. No acute process. Electronically signed by: Evalene Coho MD 03/18/2024 06:25 AM EDT RP Workstation: [id number]    Microbiology: Results for orders placed or performed during the hospital encounter of 03/22/24  MRSA Next Gen by PCR, Nasal     Status: None   Collection Time: 03/22/24  6:55 PM   Specimen: Nasal Mucosa; Nasal Swab  Result Value Ref Range Status   MRSA by PCR Next Gen NOT DETECTED NOT DETECTED Final    Comment: (NOTE) The GeneXpert MRSA Assay (FDA approved for NASAL specimens only), is one component of a comprehensive MRSA colonization surveillance program. It is not intended to diagnose MRSA infection nor to guide or monitor treatment for MRSA infections. Test performance is not FDA approved in patients less than 77 years old. Performed at [facility], [address] [place], [place] [address]    Labs: CBC: Recent Labs  Lab 03/25/24 [phone] 03/26/24 0313 03/27/24 0443 03/28/24 0324 03/29/24 0231  WBC 6.8 6.6 5.0 4.7 4.5  NEUTROABS 4.8 4.5 3.2 2.9 2.8  HGB 10.1* 10.0* 9.4* 9.8* 9.3*  HCT 30.9* 30.0* 28.5* 30.4* 29.1*  MCV 95.1 93.8 94.4 95.0 96.0  PLT 261 284 259 255 236   Basic Metabolic Panel: Recent Labs  Lab 03/25/24 0324 03/26/24 0313 03/27/24 0443 03/28/24 0324 03/29/24 0231  NA 127* 129* 127* 129* 127*  K 5.1 4.6 5.2* 5.2* 5.4*  CL 90* 91* 90* 89* 89*  CO2 23 26 24 26 24   GLUCOSE 86 73 170* 112* 190*  BUN 45* 25* 43* 39* 70*  CREATININE 8.83* 5.93* 7.88* 6.97* 8.74*  CALCIUM  8.6* 8.3* 8.2* 8.0* 8.4*  MG 2.0 2.0 2.0 1.9 2.2  PHOS 5.2* 4.6 6.4* 5.8* 5.5*   Liver Function Tests:  Recent Labs  Lab  03/25/24 0324 03/26/24 0313 03/27/24 0443 03/28/24 0324 03/29/24 0231  ALBUMIN  3.0* 2.8* 2.7* 2.7* 2.8*   CBG: Recent Labs  Lab 03/27/24 2001 03/28/24 0741 03/28/24 1220 03/28/24 1631 03/28/24 2027  GLUCAP 129* 147* 158* 144* 161*    Discharge time spent: greater than 30 minutes.  Author: Yetta Blanch, MD  [facility]

## 2024-03-29 NOTE — TOC Progression Note (Addendum)
 Transition of Care [facility]) - Progression Note    Patient Details  Name: Anees Vanecek MRN: [id number] Date of Birth: 01-19-1951  [facility]) CM/SW Contact  Lendia Dais, [place] Phone Number: 03/29/2024, 8:49 AM  Clinical Narrative: Per MD pt is medically stable. [facility] stated that they can take the pt today once he has received HD. No insurance auth needed.  Per PT recs, PTAR is not medically necessary. CSW spoke to pt's friend [name] who stated that could provide transportation between 4-5pm.   CSW will continue to monitor.     Expected Discharge Plan: Skilled Nursing Facility Barriers to Discharge: Continued Medical Work up, SNF Pending bed offer               Expected Discharge Plan and Services In-house Referral: Clinical Social Work   Post Acute Care Choice: Skilled Nursing Facility Living arrangements for the past 2 months: Apartment                                       Social Drivers of Health (SDOH) Interventions SDOH Screenings   Food Insecurity: No Food Insecurity (03/22/2024)  Housing: Low Risk  (03/22/2024)  Transportation Needs: No Transportation Needs (03/22/2024)  Utilities: Not At Risk (03/22/2024)  Social Connections: Moderately Isolated (03/22/2024)  Tobacco Use: Medium Risk (03/22/2024)    Readmission Risk Interventions    07/07/2022    9:49 AM  Readmission Risk Prevention Plan  Transportation Screening Complete  PCP or Specialist Appt within 3-5 Days Complete  HRI or Home Care Consult Complete  Social Work Consult for Recovery Care Planning/Counseling --  Palliative Care Screening Not Applicable  Medication Review Oceanographer) Complete

## 2024-03-30 NOTE — Discharge Planning (Signed)
 [facility] Patient Discharge Orders- [facility]INIC: [facility]  Patient's name: Shawn Collins Admit/DC Dates: 03/22/2024 - 03/29/2024  Discharge Diagnoses: Hypertensive urgency   Dry gangrene of toes  Aranesp : Given: no   Date and amount of last dose: n/a  Last Hgb: 9.3 PRBC's Given: no Date/# of units: n/a ESA dose for discharge: mircera 75 mcg IV q 2 weeks  IV Iron dose at discharge: none  Heparin  change: no  EDW Change: no New EDW:   [place] Change: no  Access intervention/Change: no Details:  Hectorol /Calcitriol change: no  Discharge Labs: Calcium  8.4 Phosphorus 5.5 Albumin  2.8 K+ 5.4  IV Antibiotics: no Details:  On Coumadin?: no Last INR: Next INR: Managed By:   OTHER/APPTS/LAB ORDERS: keep reinforcing fluid restrictions, has been eating a lot of ice    D/C Meds to be reconciled by nurse after every discharge.  Completed By: Lucie Collet, PA-C 03/30/2024, 8:11 AM  [facility] Pager: [phone]    Reviewed by: MD:______ RN_______

## 2024-04-24 ENCOUNTER — Emergency Department (HOSPITAL_COMMUNITY)
Admission: EM | Admit: 2024-04-24 | Discharge: 2024-04-24 | Disposition: A | Discharge status: Home / Self Care | Attending: Emergency Medicine | Admitting: Emergency Medicine

## 2024-04-24 ENCOUNTER — Other Ambulatory Visit: Payer: Self-pay

## 2024-04-24 ENCOUNTER — Emergency Department (HOSPITAL_COMMUNITY)

## 2024-04-24 ENCOUNTER — Encounter (HOSPITAL_COMMUNITY): Payer: Self-pay

## 2024-04-24 DIAGNOSIS — I120 Hypertensive chronic kidney disease with stage 5 chronic kidney disease or end stage renal disease: Secondary | ICD-10-CM | POA: Diagnosis not present

## 2024-04-24 DIAGNOSIS — Z992 Dependence on renal dialysis: Secondary | ICD-10-CM | POA: Insufficient documentation

## 2024-04-24 DIAGNOSIS — I2510 Atherosclerotic heart disease of native coronary artery without angina pectoris: Secondary | ICD-10-CM | POA: Insufficient documentation

## 2024-04-24 DIAGNOSIS — M25532 Pain in left wrist: Secondary | ICD-10-CM | POA: Diagnosis not present

## 2024-04-24 DIAGNOSIS — W19XXXA Unspecified fall, initial encounter: Secondary | ICD-10-CM | POA: Insufficient documentation

## 2024-04-24 DIAGNOSIS — Z79899 Other long term (current) drug therapy: Secondary | ICD-10-CM | POA: Diagnosis not present

## 2024-04-24 DIAGNOSIS — S63502A Unspecified sprain of left wrist, initial encounter: Secondary | ICD-10-CM | POA: Insufficient documentation

## 2024-04-24 DIAGNOSIS — Y9301 Activity, walking, marching and hiking: Secondary | ICD-10-CM | POA: Insufficient documentation

## 2024-04-24 DIAGNOSIS — N186 End stage renal disease: Secondary | ICD-10-CM | POA: Insufficient documentation

## 2024-04-24 DIAGNOSIS — S6992XA Unspecified injury of left wrist, hand and finger(s), initial encounter: Secondary | ICD-10-CM | POA: Diagnosis present

## 2024-04-24 DIAGNOSIS — Z7982 Long term (current) use of aspirin: Secondary | ICD-10-CM | POA: Diagnosis not present

## 2024-04-24 MED ORDER — MORPHINE SULFATE (PF) 4 MG/ML IV SOLN
4.0000 mg | Freq: Once | INTRAVENOUS | Status: AC
  Administered 2024-04-24: 4 mg via INTRAMUSCULAR
  Filled 2024-04-24: qty 1

## 2024-04-24 MED ORDER — HYDROCODONE-ACETAMINOPHEN 5-325 MG PO TABS: 1.0000 | ORAL_TABLET | ORAL | 0 refills | Status: DC | PRN

## 2024-04-24 NOTE — ED Notes (Signed)
Ortho tech called for wrist brace  

## 2024-04-24 NOTE — ED Provider Notes (Signed)
 [place] EMERGENCY DEPARTMENT AT [facility] Provider Note   CSN: [id number] Arrival date & time: 04/24/24  2150     Patient presents with: Wrist Pain   Shawn Collins is a 73 y.o. male.   Pt is a 73 yo male with pmhx significant for ESRD on HD (MWF), HLD, HTN, CAD, and CVA.  Pt was walking downtown and tripped.  He put out his left hand to try to catch his fall and landed on the left hand.  He has pain to the left wrist.  He is left handed.  No other injuries.  He has been ambulatory since the fall.       Prior to Admission medications   Medication Sig Start Date End Date Taking? Authorizing Provider  HYDROcodone -acetaminophen  (NORCO/VICODIN) 5-325 MG tablet Take 1 tablet by mouth every 4 (four) hours as needed. 04/24/24  Yes Dean Clarity, MD  amLODipine  (NORVASC ) 10 MG tablet Take 1 tablet (10 mg total) by mouth at bedtime. 07/20/23   Rojelio Nest, DO  aspirin  81 MG chewable tablet Chew 1 tablet (81 mg total) by mouth daily. 07/20/23   Rojelio Nest, DO  atorvastatin  (LIPITOR ) 40 MG tablet Take 1 tablet (40 mg total) by mouth at bedtime. 07/20/23   Rojelio Nest, DO  cloNIDine  (CATAPRES ) 0.1 MG tablet Take 1 tablet (0.1 mg total) by mouth 2 (two) times daily. 03/29/24   Tobie Yetta HERO, MD  clopidogrel  (PLAVIX ) 75 MG tablet Take 75 mg by mouth daily. 01/14/24   [provider]  hydrALAZINE  (APRESOLINE ) 50 MG tablet Take 1 tablet (50 mg total) by mouth every 8 (eight) hours. 07/20/23   Rojelio Nest, DO  losartan  (COZAAR ) 100 MG tablet Take 1 tablet (100 mg total) by mouth daily. 07/20/23   Rojelio Nest, DO  nitroGLYCERIN  (NITROSTAT ) 0.4 MG SL tablet Place 0.4 mg under the tongue every 5 (five) minutes as needed for chest pain.    [provider]  Nutritional Supplements (FEEDING SUPPLEMENT, NEPRO CARB STEADY,) LIQD Take 237 mLs by mouth 3 (three) times daily between meals. 03/29/24   Tobie Yetta HERO, MD  spironolactone  (ALDACTONE ) 25 MG tablet Take  25 mg by mouth daily. 08/23/23   [provider]    Allergies: Patient has no known allergies.    Review of Systems  Musculoskeletal:        Left wrist pain  All other systems reviewed and are negative.   Updated Vital Signs BP (!) 204/78 (BP Location: Right Arm)   Pulse 64   Temp 97.8 F (36.6 C) (Oral)   Resp 19   Ht 5' 10 (1.778 m)   Wt 56.7 kg   SpO2 100%   BMI 17.94 kg/m   Physical Exam Vitals and nursing note reviewed.  Constitutional:      Appearance: Normal appearance.  HENT:     Head: Normocephalic and atraumatic.     Right Ear: External ear normal.     Left Ear: External ear normal.     Nose: Nose normal.     Mouth/Throat:     Mouth: Mucous membranes are moist.     Pharynx: Oropharynx is clear.  Eyes:     Extraocular Movements: Extraocular movements intact.     Conjunctiva/sclera: Conjunctivae normal.     Pupils: Pupils are equal, round, and reactive to light.  Cardiovascular:     Rate and Rhythm: Normal rate and regular rhythm.     Pulses: Normal pulses.     Heart sounds:  Normal heart sounds.  Pulmonary:     Effort: Pulmonary effort is normal.     Breath sounds: Normal breath sounds.  Abdominal:     General: Abdomen is flat. Bowel sounds are normal.     Palpations: Abdomen is soft.  Musculoskeletal:       Arms:     Cervical back: Normal range of motion and neck supple.     Comments: + AVF with good thrill to left forearm  Skin:    General: Skin is warm.     Capillary Refill: Capillary refill takes less than 2 seconds.  Neurological:     General: No focal deficit present.     Mental Status: He is alert and oriented to person, place, and time.  Psychiatric:        Mood and Affect: Mood normal.        Behavior: Behavior normal.     (all labs ordered are listed, but only abnormal results are displayed) Labs Reviewed - No data to display  EKG: None  Radiology: DG Wrist Complete Left Result Date: 04/24/2024 EXAM: 3 OR MORE  VIEW(S) XRAY OF THE LEFT WRIST 04/24/2024 10:19:00 PM COMPARISON: None available. CLINICAL HISTORY: pain FINDINGS: BONES AND JOINTS: No acute fracture. No focal osseous lesion. No joint dislocation. SOFT TISSUES: Dorsal wrist soft tissue swelling. Vascular calcifications. IMPRESSION: 1. No acute bony abnormality is noted. Electronically signed by: Oneil Devonshire MD 04/24/2024 10:23 PM EST RP Workstation: [id number]     Procedures   Medications Ordered in the ED  morphine  (PF) 4 MG/ML injection 4 mg (4 mg Intramuscular Given 04/24/24 2218)                                    Medical Decision Making Amount and/or Complexity of Data Reviewed Radiology: ordered.  Risk Prescription drug management.   This patient presents to the ED for concern of l wrist pain, this involves an extensive number of treatment options, and is a complaint that carries with it a high risk of complications and morbidity.  The differential diagnosis includes fx, sprain   Co morbidities that complicate the patient evaluation  ESRD on HD (MWF), HLD, HTN, CAD, and CVA   Additional history obtained:  Additional history obtained from epic chart review External records from outside source obtained and reviewed including EMS Report  Imaging Studies ordered:  I ordered imaging studies including left wrist  I independently visualized and interpreted imaging which showed No acute bony abnormality is noted.  I agree with the radiologist interpretation  Medicines ordered and prescription drug management:  I ordered medication including morphine   for pain  Reevaluation of the patient after these medicines showed that the patient improved I have reviewed the patients home medicines and have made adjustments as needed   Test Considered:  ct   Problem List / ED Course:  L wrist sprain:  pt placed in a wrist brace.  He is stable for d/c.  Return if worse.  F/u with hand.   Reevaluation:  After the  interventions noted above, I reevaluated the patient and found that they have :improved   Social Determinants of Health:  Lives at home   Dispostion:  After consideration of the diagnostic results and the patients response to treatment, I feel that the patent would benefit from discharge with outpatient f/u.       Final diagnoses:  Sprain of left wrist,  unspecified location, initial encounter    ED Discharge Orders          Ordered    HYDROcodone -acetaminophen  (NORCO/VICODIN) 5-325 MG tablet  Every 4 hours PRN        04/24/24 2229               Dean Clarity, MD 04/24/24 2230

## 2024-04-24 NOTE — ED Triage Notes (Addendum)
 Pt BIBA from home s/p mechanical fall with left wrist pain. Pt has fistula on the left, last dialysis was yesterday. Pt not on blood thinners, did not hit head, no other injuries noted. CMS intact, pt denies tingling or numbness to left hand

## 2024-04-24 NOTE — Progress Notes (Addendum)
 Orthopedic Tech Progress Note Patient Details:  Shawn Collins 04/02/1951 [id number] Difficulty with application. Unable to extend wrist w/o severe pain, but was able to get it on through several attempts. Ortho Devices Type of Ortho Device: Velcro wrist splint Ortho Device/Splint Location: L WRIST Ortho Device/Splint Interventions: Ordered, Application   Post Interventions Patient Tolerated: Poor Instructions Provided: Care of device  [name] [name] 04/24/2024, 11:24 PM

## 2024-04-24 NOTE — ED Notes (Signed)
 Ortho tech at bedside applying brace before discharge.

## 2024-05-02 ENCOUNTER — Encounter: Payer: Self-pay | Admitting: Podiatry

## 2024-05-02 ENCOUNTER — Ambulatory Visit

## 2024-05-02 ENCOUNTER — Ambulatory Visit: Admitting: Podiatry

## 2024-05-02 VITALS — Ht 70.0 in | Wt 125.0 lb

## 2024-05-02 DIAGNOSIS — S92344A Nondisplaced fracture of fourth metatarsal bone, right foot, initial encounter for closed fracture: Secondary | ICD-10-CM | POA: Diagnosis not present

## 2024-05-02 DIAGNOSIS — M79674 Pain in right toe(s): Secondary | ICD-10-CM

## 2024-05-02 NOTE — Progress Notes (Unsigned)
 Subjective: Chief Complaint  Patient presents with   Foot Pain    Pt is here due to right foot pain, states he fell last week and has been having pain and swelling to the foot since, no other complaints   73 year old male presents the office today with concerns of a fall that occurred on Wednesday.  Since that time he was treated for injury to his wrist but he was not have any issues with the foot at the time.  He does not report any ulcerations.  DOI: 04/24/2024  Objective: AAO x3, NAD DP, PT pulses decreased. No open wounds. Diffuse discomfort along the dorsal aspect of the forefoot on the metatarsal heads.  There is no pain to the ankle.  No pain in the Achilles tendon and no deficit is noted along the Achilles tendon.  Flexor, extensor tendons appear to be intact. No pain with calf compression, swelling, warmth, erythema  Assessment: Concern for metatarsal fractures  Plan: -All treatment options discussed with the patient including all alternatives, risks, complications.  -X-rays obtained reviewed.  Multiple views obtained.  Extensive vessel calcifications present.  Concern for fractures along the metatarsal necks. -Given his pain, injury recommend immobilization in a surgical shoe which was dispensed to help facilitate healing -Elevation -Patient encouraged to call the office with any questions, concerns, change in symptoms.   Return in about 3 weeks (around 05/23/2024) for right foot injury, x-ray.  Donnice JONELLE Fees DPM

## 2024-05-09 ENCOUNTER — Observation Stay (HOSPITAL_COMMUNITY)
Admission: RE | Admit: 2024-05-09 | Discharge: 2024-05-10 | Disposition: A | Discharge status: Home / Self Care | Source: Ambulatory Visit | Attending: Vascular Surgery | Admitting: Vascular Surgery

## 2024-05-09 ENCOUNTER — Other Ambulatory Visit (HOSPITAL_COMMUNITY): Payer: Self-pay

## 2024-05-09 ENCOUNTER — Inpatient Hospital Stay: Admit: 2024-05-09 | Admitting: Vascular Surgery

## 2024-05-09 ENCOUNTER — Other Ambulatory Visit: Payer: Self-pay

## 2024-05-09 ENCOUNTER — Inpatient Hospital Stay (HOSPITAL_COMMUNITY): Admitting: Anesthesiology

## 2024-05-09 ENCOUNTER — Encounter (HOSPITAL_COMMUNITY)
Admission: RE | Disposition: A | Discharge status: Home / Self Care | Payer: Self-pay | Source: Ambulatory Visit | Attending: Vascular Surgery

## 2024-05-09 ENCOUNTER — Ambulatory Visit (HOSPITAL_COMMUNITY)
Admission: RE | Admit: 2024-05-09 | Discharge: 2024-05-09 | Disposition: A | Discharge status: Home / Self Care | Attending: Surgery | Admitting: Surgery

## 2024-05-09 ENCOUNTER — Encounter (HOSPITAL_COMMUNITY)
Admission: RE | Disposition: A | Discharge status: Home / Self Care | Payer: Self-pay | Source: Home / Self Care | Attending: Surgery

## 2024-05-09 ENCOUNTER — Encounter (HOSPITAL_COMMUNITY): Payer: Self-pay | Admitting: Vascular Surgery

## 2024-05-09 DIAGNOSIS — T82868A Thrombosis of vascular prosthetic devices, implants and grafts, initial encounter: Secondary | ICD-10-CM

## 2024-05-09 DIAGNOSIS — Z992 Dependence on renal dialysis: Secondary | ICD-10-CM | POA: Diagnosis not present

## 2024-05-09 DIAGNOSIS — I120 Hypertensive chronic kidney disease with stage 5 chronic kidney disease or end stage renal disease: Secondary | ICD-10-CM | POA: Diagnosis not present

## 2024-05-09 DIAGNOSIS — N186 End stage renal disease: Secondary | ICD-10-CM | POA: Diagnosis present

## 2024-05-09 DIAGNOSIS — Z79899 Other long term (current) drug therapy: Secondary | ICD-10-CM | POA: Diagnosis not present

## 2024-05-09 DIAGNOSIS — I2510 Atherosclerotic heart disease of native coronary artery without angina pectoris: Secondary | ICD-10-CM | POA: Diagnosis not present

## 2024-05-09 DIAGNOSIS — Z87891 Personal history of nicotine dependence: Secondary | ICD-10-CM | POA: Diagnosis not present

## 2024-05-09 DIAGNOSIS — J449 Chronic obstructive pulmonary disease, unspecified: Secondary | ICD-10-CM | POA: Diagnosis not present

## 2024-05-09 DIAGNOSIS — Z8673 Personal history of transient ischemic attack (TIA), and cerebral infarction without residual deficits: Secondary | ICD-10-CM | POA: Diagnosis not present

## 2024-05-09 DIAGNOSIS — Z955 Presence of coronary angioplasty implant and graft: Secondary | ICD-10-CM | POA: Diagnosis not present

## 2024-05-09 DIAGNOSIS — E1122 Type 2 diabetes mellitus with diabetic chronic kidney disease: Secondary | ICD-10-CM | POA: Diagnosis not present

## 2024-05-09 HISTORY — PX: THROMBECTOMY W/ EMBOLECTOMY: SHX2507

## 2024-05-09 LAB — POCT I-STAT, CHEM 8
BUN: 74 mg/dL — ABNORMAL HIGH (ref 8–23)
Calcium, Ion: 1.11 mmol/L — ABNORMAL LOW (ref 1.15–1.40)
Chloride: 103 mmol/L (ref 98–111)
Creatinine, Ser: 12.1 mg/dL — ABNORMAL HIGH (ref 0.61–1.24)
Glucose, Bld: 71 mg/dL (ref 70–99)
HCT: 31 % — ABNORMAL LOW (ref 39.0–52.0)
Hemoglobin: 10.5 g/dL — ABNORMAL LOW (ref 13.0–17.0)
Potassium: 4.7 mmol/L (ref 3.5–5.1)
Sodium: 135 mmol/L (ref 135–145)
TCO2: 21 mmol/L — ABNORMAL LOW (ref 22–32)

## 2024-05-09 LAB — CBC
HCT: 31.4 % — ABNORMAL LOW (ref 39.0–52.0)
Hemoglobin: 10.2 g/dL — ABNORMAL LOW (ref 13.0–17.0)
MCH: 31.4 pg (ref 26.0–34.0)
MCHC: 32.5 g/dL (ref 30.0–36.0)
MCV: 96.6 fL (ref 80.0–100.0)
Platelets: 362 K/uL (ref 150–400)
RBC: 3.25 MIL/uL — ABNORMAL LOW (ref 4.22–5.81)
RDW: 16.1 % — ABNORMAL HIGH (ref 11.5–15.5)
WBC: 8.1 K/uL (ref 4.0–10.5)
nRBC: 0 % (ref 0.0–0.2)

## 2024-05-09 LAB — GLUCOSE, CAPILLARY
Glucose-Capillary: 76 mg/dL (ref 70–99)
Glucose-Capillary: 71 mg/dL (ref 70–99)
Glucose-Capillary: 123 mg/dL — ABNORMAL HIGH (ref 70–99)
Glucose-Capillary: 124 mg/dL — ABNORMAL HIGH (ref 70–99)
Glucose-Capillary: 111 mg/dL — ABNORMAL HIGH (ref 70–99)

## 2024-05-09 LAB — COMPREHENSIVE METABOLIC PANEL WITH GFR
ALT: 13 U/L (ref 0–44)
AST: 25 U/L (ref 15–41)
Albumin: 3.2 g/dL — ABNORMAL LOW (ref 3.5–5.0)
Alkaline Phosphatase: 54 U/L (ref 38–126)
Anion gap: 17 — ABNORMAL HIGH (ref 5–15)
BUN: 84 mg/dL — ABNORMAL HIGH (ref 8–23)
CO2: 19 mmol/L — ABNORMAL LOW (ref 22–32)
Calcium: 8.4 mg/dL — ABNORMAL LOW (ref 8.9–10.3)
Chloride: 96 mmol/L — ABNORMAL LOW (ref 98–111)
Creatinine, Ser: 11.52 mg/dL — ABNORMAL HIGH (ref 0.61–1.24)
GFR, Estimated: 4 mL/min — ABNORMAL LOW (ref >60)
Glucose, Bld: 253 mg/dL — ABNORMAL HIGH (ref 70–99)
Potassium: 5.5 mmol/L — ABNORMAL HIGH (ref 3.5–5.1)
Sodium: 132 mmol/L — ABNORMAL LOW (ref 135–145)
Total Bilirubin: 0.9 mg/dL (ref 0.0–1.2)
Total Protein: 6.9 g/dL (ref 6.5–8.1)

## 2024-05-09 LAB — PROTIME-INR
INR: 1.3 — ABNORMAL HIGH (ref 0.8–1.2)
Prothrombin Time: 16.6 s — ABNORMAL HIGH (ref 11.4–15.2)

## 2024-05-09 SURGERY — A/V FISTULAGRAM
Anesthesia: LOCAL

## 2024-05-09 SURGERY — PERIPHERAL VASCULAR THROMBECTOMY
Anesthesia: LOCAL | Site: Arm Lower | Laterality: Left

## 2024-05-09 SURGERY — THROMBECTOMY ARTERIOVENOUS GORE-TEX GRAFT
Anesthesia: General | Site: Arm Lower | Laterality: Left

## 2024-05-09 MED ORDER — EPHEDRINE 5 MG/ML INJ
INTRAVENOUS | Status: AC
  Filled 2024-05-09: qty 5

## 2024-05-09 MED ORDER — HEMOSTATIC AGENTS (NO CHARGE) OPTIME
TOPICAL | Status: DC | PRN
  Administered 2024-05-09 (×2): 1 via TOPICAL

## 2024-05-09 MED ORDER — ONDANSETRON HCL 4 MG/2ML IJ SOLN: 4.0000 mg | Freq: Four times a day (QID) | INTRAMUSCULAR | Status: DC | PRN

## 2024-05-09 MED ORDER — HEPARIN 6000 UNIT IRRIGATION SOLUTION
Status: AC
  Filled 2024-05-09: qty 500

## 2024-05-09 MED ORDER — HEPARIN 6000 UNIT IRRIGATION SOLUTION
Status: DC | PRN
  Administered 2024-05-09: 1

## 2024-05-09 MED ORDER — LIDOCAINE 2% (20 MG/ML) 5 ML SYRINGE
INTRAMUSCULAR | Status: AC
  Filled 2024-05-09: qty 5

## 2024-05-09 MED ORDER — HYDRALAZINE HCL 50 MG PO TABS
50.0000 mg | ORAL_TABLET | Freq: Two times a day (BID) | ORAL | Status: DC
  Administered 2024-05-09: 50 mg via ORAL
  Filled 2024-05-09: qty 1

## 2024-05-09 MED ORDER — CHLORHEXIDINE GLUCONATE 0.12 % MT SOLN
15.0000 mL | Freq: Once | OROMUCOSAL | Status: AC
  Administered 2024-05-09: 15 mL via OROMUCOSAL
  Filled 2024-05-09: qty 15

## 2024-05-09 MED ORDER — ASPIRIN 81 MG PO CHEW
81.0000 mg | CHEWABLE_TABLET | Freq: Every day | ORAL | Status: DC
  Administered 2024-05-09: 81 mg via ORAL
  Filled 2024-05-09: qty 1

## 2024-05-09 MED ORDER — PROPOFOL 10 MG/ML IV BOLUS
INTRAVENOUS | Status: DC | PRN
  Administered 2024-05-09: 100 mg via INTRAVENOUS
  Administered 2024-05-09: 20 mg via INTRAVENOUS

## 2024-05-09 MED ORDER — PANTOPRAZOLE SODIUM 40 MG PO TBEC
40.0000 mg | DELAYED_RELEASE_TABLET | Freq: Every day | ORAL | Status: DC
  Administered 2024-05-09: 40 mg via ORAL
  Filled 2024-05-09: qty 1

## 2024-05-09 MED ORDER — ATORVASTATIN CALCIUM 40 MG PO TABS
40.0000 mg | ORAL_TABLET | Freq: Every day | ORAL | Status: DC
  Administered 2024-05-09: 40 mg via ORAL
  Filled 2024-05-09: qty 1

## 2024-05-09 MED ORDER — DEXTROSE 50 % IV SOLN
INTRAVENOUS | Status: AC
  Administered 2024-05-09: 12.5 g via INTRAVENOUS
  Filled 2024-05-09: qty 50

## 2024-05-09 MED ORDER — 0.9 % SODIUM CHLORIDE (POUR BTL) OPTIME
TOPICAL | Status: DC | PRN
  Administered 2024-05-09: 1000 mL

## 2024-05-09 MED ORDER — PHENOL 1.4 % MT LIQD: 1.0000 | OROMUCOSAL | Status: DC | PRN

## 2024-05-09 MED ORDER — HYDRALAZINE HCL 20 MG/ML IJ SOLN
5.0000 mg | INTRAMUSCULAR | Status: DC | PRN
  Administered 2024-05-10: 5 mg via INTRAVENOUS
  Filled 2024-05-09: qty 1

## 2024-05-09 MED ORDER — GUAIFENESIN-DM 100-10 MG/5ML PO SYRP: 15.0000 mL | ORAL_SOLUTION | ORAL | Status: DC | PRN

## 2024-05-09 MED ORDER — CEFAZOLIN SODIUM-DEXTROSE 2-4 GM/100ML-% IV SOLN
2.0000 g | Freq: Once | INTRAVENOUS | Status: AC
  Administered 2024-05-09: 2 g via INTRAVENOUS

## 2024-05-09 MED ORDER — ORAL CARE MOUTH RINSE: 15.0000 mL | Freq: Once | OROMUCOSAL | Status: AC

## 2024-05-09 MED ORDER — SODIUM CHLORIDE 0.9 % IV SOLN: INTRAVENOUS | Status: DC

## 2024-05-09 MED ORDER — CEFAZOLIN SODIUM-DEXTROSE 2-4 GM/100ML-% IV SOLN
INTRAVENOUS | Status: AC
  Filled 2024-05-09: qty 100

## 2024-05-09 MED ORDER — FENTANYL CITRATE (PF) 100 MCG/2ML IJ SOLN
INTRAMUSCULAR | Status: AC
  Filled 2024-05-09: qty 2

## 2024-05-09 MED ORDER — OXYCODONE HCL 5 MG PO TABS
5.0000 mg | ORAL_TABLET | ORAL | 0 refills | Status: DC | PRN
  Filled 2024-05-09: qty 10, 2d supply, fill #0

## 2024-05-09 MED ORDER — LIDOCAINE-EPINEPHRINE (PF) 1 %-1:200000 IJ SOLN
INTRAMUSCULAR | Status: AC
  Filled 2024-05-09: qty 30

## 2024-05-09 MED ORDER — PROTAMINE SULFATE 10 MG/ML IV SOLN
INTRAVENOUS | Status: DC | PRN
  Administered 2024-05-09: 40 mg via INTRAVENOUS

## 2024-05-09 MED ORDER — POTASSIUM CHLORIDE CRYS ER 20 MEQ PO TBCR
20.0000 meq | EXTENDED_RELEASE_TABLET | Freq: Once | ORAL | Status: AC
  Administered 2024-05-09: 40 meq via ORAL
  Filled 2024-05-09: qty 2

## 2024-05-09 MED ORDER — OXYCODONE HCL 5 MG PO TABS
5.0000 mg | ORAL_TABLET | ORAL | Status: DC | PRN
  Administered 2024-05-10: 5 mg via ORAL
  Filled 2024-05-09: qty 1

## 2024-05-09 MED ORDER — DEXTROSE 50 % IV SOLN: 12.5000 g | Freq: Once | INTRAVENOUS | Status: AC

## 2024-05-09 MED ORDER — ALUM & MAG HYDROXIDE-SIMETH 200-200-20 MG/5ML PO SUSP: 15.0000 mL | ORAL | Status: DC | PRN

## 2024-05-09 MED ORDER — HYDRALAZINE HCL 50 MG PO TABS
50.0000 mg | ORAL_TABLET | Freq: Once | ORAL | Status: AC
  Administered 2024-05-09: 50 mg via ORAL
  Filled 2024-05-09: qty 1

## 2024-05-09 MED ORDER — LIDOCAINE 2% (20 MG/ML) 5 ML SYRINGE
INTRAMUSCULAR | Status: DC | PRN
  Administered 2024-05-09: 20 mg via INTRAVENOUS

## 2024-05-09 MED ORDER — HEPARIN SODIUM (PORCINE) 1000 UNIT/ML IJ SOLN
INTRAMUSCULAR | Status: AC
  Filled 2024-05-09: qty 10

## 2024-05-09 MED ORDER — FENTANYL CITRATE (PF) 250 MCG/5ML IJ SOLN
INTRAMUSCULAR | Status: DC | PRN
  Administered 2024-05-09: 25 ug via INTRAVENOUS

## 2024-05-09 MED ORDER — HEPARIN SODIUM (PORCINE) 5000 UNIT/ML IJ SOLN
5000.0000 [IU] | Freq: Three times a day (TID) | INTRAMUSCULAR | Status: DC
  Administered 2024-05-10: 5000 [IU] via SUBCUTANEOUS
  Filled 2024-05-09: qty 1

## 2024-05-09 MED ORDER — HYDROMORPHONE HCL 1 MG/ML IJ SOLN: 0.5000 mg | INTRAMUSCULAR | Status: DC | PRN

## 2024-05-09 MED ORDER — AMLODIPINE BESYLATE 5 MG PO TABS
10.0000 mg | ORAL_TABLET | Freq: Once | ORAL | Status: AC
  Administered 2024-05-09: 10 mg via ORAL
  Filled 2024-05-09: qty 2

## 2024-05-09 MED ORDER — OXYCODONE HCL 5 MG/5ML PO SOLN: 5.0000 mg | Freq: Once | ORAL | Status: DC | PRN

## 2024-05-09 MED ORDER — OXYCODONE HCL 5 MG PO TABS: 5.0000 mg | ORAL_TABLET | Freq: Once | ORAL | Status: DC | PRN

## 2024-05-09 MED ORDER — LACTATED RINGERS IV SOLN: INTRAVENOUS | Status: DC

## 2024-05-09 MED ORDER — CLONIDINE HCL 0.2 MG PO TABS
0.1000 mg | ORAL_TABLET | Freq: Once | ORAL | Status: AC
  Administered 2024-05-09: 0.1 mg via ORAL
  Filled 2024-05-09: qty 1

## 2024-05-09 MED ORDER — LABETALOL HCL 5 MG/ML IV SOLN: 10.0000 mg | INTRAVENOUS | Status: DC | PRN

## 2024-05-09 MED ORDER — PROTAMINE SULFATE 10 MG/ML IV SOLN
INTRAVENOUS | Status: AC
  Filled 2024-05-09: qty 5

## 2024-05-09 MED ORDER — PHENYLEPHRINE HCL-NACL 20-0.9 MG/250ML-% IV SOLN
INTRAVENOUS | Status: DC | PRN
  Administered 2024-05-09: 30 ug/min via INTRAVENOUS

## 2024-05-09 MED ORDER — DEXAMETHASONE SOD PHOSPHATE PF 10 MG/ML IJ SOLN
INTRAMUSCULAR | Status: DC | PRN
  Administered 2024-05-09: 5 mg via INTRAVENOUS

## 2024-05-09 MED ORDER — AMLODIPINE BESYLATE 10 MG PO TABS: 10.0000 mg | ORAL_TABLET | Freq: Every day | ORAL | Status: DC

## 2024-05-09 MED ORDER — HEPARIN SODIUM (PORCINE) 1000 UNIT/ML IJ SOLN
INTRAMUSCULAR | Status: DC | PRN
  Administered 2024-05-09: 3000 [IU] via INTRAVENOUS
  Administered 2024-05-09: 5000 [IU] via INTRAVENOUS

## 2024-05-09 MED ORDER — EPHEDRINE SULFATE (PRESSORS) 25 MG/5ML IV SOSY
PREFILLED_SYRINGE | INTRAVENOUS | Status: DC | PRN
  Administered 2024-05-09: 5 mg via INTRAVENOUS

## 2024-05-09 MED ORDER — FENTANYL CITRATE (PF) 100 MCG/2ML IJ SOLN
25.0000 ug | INTRAMUSCULAR | Status: DC | PRN
  Administered 2024-05-09: 25 ug via INTRAVENOUS
  Administered 2024-05-09 (×2): 50 ug via INTRAVENOUS
  Administered 2024-05-09: 25 ug via INTRAVENOUS

## 2024-05-09 MED ORDER — ONDANSETRON HCL 4 MG/2ML IJ SOLN
INTRAMUSCULAR | Status: DC | PRN
  Administered 2024-05-09: 4 mg via INTRAVENOUS

## 2024-05-09 MED ORDER — MIDAZOLAM HCL (PF) 2 MG/2ML IJ SOLN: 0.5000 mg | Freq: Once | INTRAMUSCULAR | Status: DC | PRN

## 2024-05-09 MED ORDER — SPIRONOLACTONE 25 MG PO TABS
25.0000 mg | ORAL_TABLET | Freq: Every day | ORAL | Status: DC
  Administered 2024-05-09: 25 mg via ORAL
  Filled 2024-05-09: qty 1

## 2024-05-09 MED ORDER — METOPROLOL TARTRATE 5 MG/5ML IV SOLN: 2.0000 mg | INTRAVENOUS | Status: DC | PRN

## 2024-05-09 MED ORDER — PROPOFOL 1000 MG/100ML IV EMUL
INTRAVENOUS | Status: AC
  Filled 2024-05-09: qty 100

## 2024-05-09 MED ORDER — LIDOCAINE HCL (PF) 1 % IJ SOLN
INTRAMUSCULAR | Status: AC
  Filled 2024-05-09: qty 30

## 2024-05-09 MED ADMIN — Losartan Potassium Tab 50 MG: 100 mg | ORAL | NDC 68180037709

## 2024-05-09 MED FILL — Losartan Potassium Tab 50 MG: 100.0000 mg | ORAL | Qty: 2 | Status: AC

## 2024-05-09 SURGICAL SUPPLY — 37 items
ARMBAND PINK RESTRICT EXTREMIT (MISCELLANEOUS) ×2 IMPLANT
BAG COUNTER SPONGE SURGICOUNT (BAG) ×1 IMPLANT
BAG DECANTER FOR FLEXI CONT (MISCELLANEOUS) ×1 IMPLANT
BIOPATCH RED 1 DISK 7.0 (GAUZE/BANDAGES/DRESSINGS) ×1 IMPLANT
CANISTER SUCTION 3000ML PPV (SUCTIONS) ×1 IMPLANT
CATH EMB 4FR 40 (CATHETERS) IMPLANT
CLIP TI MEDIUM 6 (CLIP) IMPLANT
CLIP TI WIDE RED SMALL 6 (CLIP) IMPLANT
COVER PROBE W GEL 5X96 (DRAPES) ×1 IMPLANT
COVER SURGICAL LIGHT HANDLE (MISCELLANEOUS) ×1 IMPLANT
DERMABOND ADVANCED .7 DNX12 (GAUZE/BANDAGES/DRESSINGS) IMPLANT
DRAPE C-ARM 42X72 X-RAY (DRAPES) ×1 IMPLANT
DRAPE HALF SHEET 40X57 (DRAPES) IMPLANT
DRAPE SURG ORHT 6 SPLT 77X108 (DRAPES) IMPLANT
ELECTRODE REM PT RTRN 9FT ADLT (ELECTROSURGICAL) ×1 IMPLANT
GAUZE SPONGE 4X4 12PLY STRL (GAUZE/BANDAGES/DRESSINGS) IMPLANT
GOWN STRL REUS W/ TWL LRG LVL3 (GOWN DISPOSABLE) ×2 IMPLANT
GOWN STRL REUS W/ TWL XL LVL3 (GOWN DISPOSABLE) ×1 IMPLANT
GRAFT VASCULAR 7X40 (Vascular Products) IMPLANT
HEMOSTAT SNOW SURGICEL 2X4 (HEMOSTASIS) IMPLANT
KIT BASIN OR (CUSTOM PROCEDURE TRAY) ×1 IMPLANT
KIT TURNOVER KIT B (KITS) ×1 IMPLANT
PACK CV ACCESS (CUSTOM PROCEDURE TRAY) ×1 IMPLANT
PAD ARMBOARD POSITIONER FOAM (MISCELLANEOUS) ×2 IMPLANT
SET MICROPUNCTURE 5F STIFF (MISCELLANEOUS) ×1 IMPLANT
SOAP 2 % CHG 4 OZ (WOUND CARE) ×1 IMPLANT
SOLN 0.9% NACL POUR BTL 1000ML (IV SOLUTION) ×1 IMPLANT
SOLN STERILE WATER BTL 1000 ML (IV SOLUTION) ×1 IMPLANT
SUT GORETEX 6 0 TT 9 (SUTURE) IMPLANT
SUT MNCRL AB 4-0 PS2 18 (SUTURE) ×1 IMPLANT
SUT PROLENE 5 0 C 1 24 (SUTURE) IMPLANT
SUT PROLENE 6 0 BV (SUTURE) ×2 IMPLANT
SUT SILK 2 0 SH CR/8 (SUTURE) IMPLANT
SUT VIC AB 3-0 SH 27X BRD (SUTURE) ×2 IMPLANT
TOWEL GREEN STERILE (TOWEL DISPOSABLE) ×1 IMPLANT
TOWEL GREEN STERILE FF (TOWEL DISPOSABLE) ×2 IMPLANT
UNDERPAD 30X36 HEAVY ABSORB (UNDERPADS AND DIAPERS) ×1 IMPLANT

## 2024-05-09 NOTE — Discharge Instructions (Signed)
° °  [facility]  Discharge Instructions  AV Fistula or Graft Surgery for Dialysis Access  Please refer to the following instructions for your post-procedure care. Your surgeon or physician assistant will discuss any changes with you.  Activity  You may drive the day following your surgery, if you are comfortable and no longer taking prescription pain medication. Resume full activity as the soreness in your incision resolves.  Bathing/Showering  You may shower after you go home. Keep your incision dry for 48 hours. Do not soak in a bathtub, hot tub, or swim until the incision heals completely. You may not shower if you have a hemodialysis catheter.  Incision Care  Clean your incision with mild soap and water after 48 hours. Pat the area dry with a clean towel. You do not need a bandage unless otherwise instructed. Do not apply any ointments or creams to your incision. You may have skin glue on your incision. Do not peel it off. It will come off on its own in about one week. Your arm may swell a bit after surgery. To reduce swelling use pillows to elevate your arm so it is above your heart. Your doctor will tell you if you need to lightly wrap your arm with an ACE bandage.  Diet  Resume your normal diet. There are not special food restrictions following this procedure. In order to heal from your surgery, it is CRITICAL to get adequate nutrition. Your body requires vitamins, minerals, and protein. Vegetables are the best source of vitamins and minerals. Vegetables also provide the perfect balance of protein. Processed food has little nutritional value, so try to avoid this.  Medications  Resume taking all of your medications. If your incision is causing pain, you may take over-the counter pain relievers such as acetaminophen  (Tylenol ). If you were prescribed a stronger pain medication, please be aware these medications can cause nausea and constipation. Prevent  nausea by taking the medication with a snack or meal. Avoid constipation by drinking plenty of fluids and eating foods with high amount of fiber, such as fruits, vegetables, and grains.  Do not take Tylenol  if you are taking prescription pain medications.  Follow up Your surgeon may want to see you in the office following your access surgery. If so, this will be arranged at the time of your surgery.  Please call us  immediately for any of the following conditions:  Increased pain, redness, drainage (pus) from your incision site Fever of 101 degrees or higher Severe or worsening pain at your incision site Hand pain or numbness.  Reduce your risk of vascular disease:  Stop smoking. If you would like help, call [facility] at 1-800-QUIT-NOW (501 097 1801) or [place] at [phone]  Manage your cholesterol Maintain a desired weight Control your diabetes Keep your blood pressure down  Dialysis  Your graft can be used immediately for dialysis access   05/09/2024 Shawn Collins [id number] 18-Apr-1951  Surgeon(s): Pearline Norman RAMAN, MD  Procedures: THROMBECTOMY AND REVISION OF LEFT FOREARM ARTERIOVENOUS GORE-TEX GRAFT, USING GORE-TEX 7 MM STRETCH AS INTERPOSITION GRAFT  x May stick graft immediately   May stick graft on designated area only:        If you have any questions, please call the office at [phone].

## 2024-05-09 NOTE — Op Note (Signed)
 OPERATIVE NOTE  PROCEDURE:   Open thrombectomy of the left forearm AV loop graft Revision of left forearm loop graft with interposition grafting from the venous limb to the basilic vein, 7 mm PTFE  PRE-OPERATIVE DIAGNOSIS: ESRD on HD with a thrombosed left forearm loop graft  POST-OPERATIVE DIAGNOSIS: same as above   SURGEON: Norman GORMAN Serve MD  ASSISTANT(S): Ahmed Holster, PA  Given the complexity of the case,  the assistant was necessary in order to expedient the procedure and safely perform the technical aspects of the operation.  The assistant provided traction and countertraction to assist with exposure of the graft proximally and distally.  They assisted with suture ligature of branches.  Their assistance was critical in the performance of the anastomosis.These skills, especially following the Prolene suture for the anastomosis, could not have been adequately performed by a scrub tech assistant.  ANESTHESIA: general  ESTIMATED BLOOD LOSS: 100 cc  FINDING(S): Acute thrombosis of the left forearm loop graft.  Fresh thrombus removed from the graft and pulsatile inflow was restored.  An interposition bypass was then performed from the venous limb to the left basilic vein which was much better outflow as the brachial vein was thrombosed.  SPECIMEN(S): None  INDICATIONS:   Shawn Collins is a 73 y.o. male with ESRD on HD with a thrombosed left forearm loop graft and compromised brachial vein outflow.  His last dialysis session was Monday, on Wednesday his access was found to be occluded.  When reviewing the fistulogram that his had previously been there appeared to be a adequately sized and patent basilic vein that could be used as adequate outflow.  Risks and benefits of thrombectomy with interposition grafting versus left brachial to axillary AV grafting and tunneled dialysis catheter placement were reviewed.  I explained we would have to proceed with a new graft, dialysis catheter  if thrombectomy is unsuccessful.  He expressed understanding and elected to proceed.  DESCRIPTION: The patient was brought to the operating room and positioned supine on the operating table.  Anesthesia was induced and the left arm was prepped and draped in the usual sterile fashion.  Preoperative antibiotics were administered and a timeout was performed. We started by ultrasound mapping the left arm.  The venous anastomosis was noted and the basilic vein was mapped and noted to be adequately sized for outflow.  A transverse incision was made over the venous limb of the loop graft.  This was isolated for approximately 2 to 3 cm and clamped proximally and distally.  The graft was then transected and the side of the venous anastomosis was ligated with a 5-0 Prolene.  Using a #4 Fogarty the entirety of the loop graft was thrombectomized, multiple passes were made and pulsatile inflow was restored.  There continued to be a radial pulse throughout this process.  The patient was then systemically heparinized.  A longitudinal incision was made overlying the basilic vein.  This was isolated for approximately 3 cm.  It was transected and ligated with a silk tie distally and proximally it was spatulated.  A 7 mm PTFE graft was then tunneled subcutaneously from the transected venous limb of the forearm loop graft across the medial aspect of the elbow joint to the basilic vein.  An end-to-end anastomosis was then performed between the new interposition bypass graft and the basilic vein with 6-0 Prolene in a running quadrant fashion.  The graft was then trimmed to length and fashion and and to the transected venous  limb of the forearm loop graft with a CV 6 Gore-Tex suture.  The graft was de-aired and forward flushed and backbled prior to releasing the clamps.  The anastomosis was then tied down, clamps were released and there was a palpable thrill.  Hemostatic agents were applied and a Doppler was brought onto the field.   There is a multiphasic radial signal at the wrist and an excellent Doppler bruit and the upper arm basilic vein.  Hemostasis was achieved with a few repair sutures and both incisions were closed in layers with 3-0 Vicryl and 4-0 Monocryl for the skin.  Dermabond was then applied. All counts were correct at the end of the procedure, he tolerated the procedure well and was brought to the PACU in stable condition.    COMPLICATIONS: None apparent  CONDITION: Stable  Norman GORMAN Serve MD [facility] Phone Number: [phone] 05/09/2024 1:58 PM

## 2024-05-09 NOTE — Anesthesia Postprocedure Evaluation (Signed)
 Anesthesia Post Note  Patient: Shawn Collins  Procedure(s) Performed: THROMBECTOMY AND REVISION OF LEFT FOREARM ARTERIOVENOUS GORE-TEX GRAFT, USING GORE-TEX 7 MM STRETCH AS INTERPOSITION GRAFT (Left: Arm Lower)     Patient location during evaluation: PACU Anesthesia Type: General Level of consciousness: awake Pain management: pain level controlled Vital Signs Assessment: post-procedure vital signs reviewed and stable Respiratory status: spontaneous breathing Cardiovascular status: blood pressure returned to baseline Postop Assessment: no apparent nausea or vomiting Anesthetic complications: no   No notable events documented.                 Lauraine DASEN Colhoun

## 2024-05-09 NOTE — Anesthesia Preprocedure Evaluation (Addendum)
 Anesthesia Evaluation  Patient identified by MRN, date of birth, ID bandGeneral Assessment Comment:Pt somnolent  Reviewed: Allergy & Precautions, NPO status , Patient's Chart, lab work & pertinent test results  History of Anesthesia Complications Negative for: history of anesthetic complications  Airway Mallampati: II  TM Distance: >3 FB Neck ROM: Full    Dental  (+) Missing, Poor Dentition, Dental Advisory Given, Chipped   Pulmonary COPD, former smoker   breath sounds clear to auscultation       Cardiovascular hypertension, Pt. on medications (-) angina + CAD, + Past MI and + CABG   Rhythm:Regular Rate:Normal  '24 ECHO: EF 70 to 75%. 1. The LV has hyperdynamic function, no regional wall motion abnormalities. There is mild concentric LVH  2. RVF is normal. The right ventricular size is normal.   3. The mitral valve is normal in structure. Trivial mitral valve regurgitation. No evidence of mitral stenosis. Moderate mitral annular calcification.   4. The aortic valve is tricuspid. There is mild calcification of the aortic valve. Aortic valve regurgitation is not visualized. Aortic valve sclerosis/calcification is present, without any evidence of aortic stenosis.     Neuro/Psych CVA, Residual Symptoms    GI/Hepatic negative GI ROS, Neg liver ROS,,,  Endo/Other  diabetes (glu 71)    Renal/GU ESRF and DialysisRenal disease (K+ 4.7)     Musculoskeletal   Abdominal   Peds  Hematology  (+) Blood dyscrasia (Hb 10.5), anemia   Anesthesia Other Findings   Reproductive/Obstetrics                              Anesthesia Physical Anesthesia Plan  ASA: 3  Anesthesia Plan: General   Post-op Pain Management: Ofirmev  IV (intra-op)*   Induction: Intravenous  PONV Risk Score and Plan: 2 and Ondansetron  and Dexamethasone  Airway Management Planned: LMA  Additional Equipment: None  Intra-op Plan:    Post-operative Plan:   Informed Consent: I have reviewed the patients History and Physical, chart, labs and discussed the procedure including the risks, benefits and alternatives for the proposed anesthesia with the patient or authorized representative who has indicated his/her understanding and acceptance.     Dental advisory given  Plan Discussed with: CRNA and Surgeon  Anesthesia Plan Comments:          Anesthesia Quick Evaluation

## 2024-05-09 NOTE — Anesthesia Procedure Notes (Signed)
 Procedure Name: LMA Insertion Date/Time: 05/09/2024 10:05 AM  Performed by: Delores Dus, CRNAPre-anesthesia Checklist: Patient identified, Emergency Drugs available, Suction available, Patient being monitored and Timeout performed Patient Re-evaluated:Patient Re-evaluated prior to induction Oxygen Delivery Method: Circle system utilized Preoxygenation: Pre-oxygenation with 100% oxygen Induction Type: IV induction LMA Size: 4.0 Number of attempts: 1

## 2024-05-09 NOTE — Transfer of Care (Signed)
 Immediate Anesthesia Transfer of Care Note  Patient: Shawn Collins  Procedure(s) Performed: THROMBECTOMY AND REVISION OF LEFT FOREARM ARTERIOVENOUS GORE-TEX GRAFT, USING GORE-TEX 7 MM STRETCH AS INTERPOSITION GRAFT (Left: Arm Lower)  Patient Location: PACU  Anesthesia Type:General  Level of Consciousness: awake and sedated  Airway & Oxygen Therapy: Patient Spontanous Breathing and Patient connected to face mask oxygen  Post-op Assessment: Report given to RN and Post -op Vital signs reviewed and stable  Post vital signs: Reviewed and stable  Last Vitals:  Vitals Value Taken Time  BP 170/70 05/09/24 12:06  Temp 36.2 C 05/09/24 12:06  Pulse 69 05/09/24 12:07  Resp 14 05/09/24 12:07  SpO2 99 % 05/09/24 12:07  Vitals shown include unfiled device data.  Last Pain:  Vitals:   05/09/24 0902  TempSrc: Oral  PainSc: 0-No pain         Complications: No notable events documented.

## 2024-05-09 NOTE — H&P (Signed)
 HD ACCESS CENTER H&P   Patient ID: Mearl Olver, male   DOB: 08-03-1950, 73 y.o.   MRN: [id number]  Subjective:     HPI Khush Pasion is a 73 y.o. male with ESRD presenting for a clotted forearm graft.  He underwent dialysis on Monday but on Wednesday was noted to have a clotted access.  Past Medical History:  Diagnosis Date   CAD (coronary artery disease), native coronary artery    s/p NSTEMI with cath showing severe 3VD s/p  CABG x 3 with LIMA to LAD, SVG to OM1/OM2 and SVG D1 by Dr. Army on 10/26/16.   COPD (chronic obstructive pulmonary disease) (HCC) 09/10/2014   CVA (cerebral vascular accident) (HCC) 12/18/2021   Diabetes mellitus without complication (HCC)    Type II   ESRD (end stage renal disease) (HCC)    Hyperlipidemia LDL goal <70 04/17/2017   Hypertension    Family History  Problem Relation Age of Onset   Diabetes Mellitus II Mother    Stroke Father    Hypertension Father    Diabetes Mellitus II Sister    Past Surgical History:  Procedure Laterality Date   A/V SHUNT INTERVENTION N/A 08/17/2023   Procedure: A/V SHUNT INTERVENTION;  Surgeon: Norine Manuelita LABOR, MD;  Location: [facility] INVASIVE CV LAB;  Service: Cardiovascular;  Laterality: N/A;   AV FISTULA PLACEMENT Left 12/21/2021   Procedure: INSERTION OF LEFT ARM ARTERIOVENOUS (AV) GORE-TEX GRAFT;  Surgeon: Eliza Lonni RAMAN, MD;  Location: [facility] OR;  Service: Vascular;  Laterality: Left;   CORONARY ARTERY BYPASS GRAFT N/A 10/26/2016   Procedure: CORONARY ARTERY BYPASS GRAFTING (CABG) x 4 USING LEFT INTERNAL MAMMARY ARTERY TO LAD AND ENDOSCOPICALLY HARVESTED GREATER SAPHENOUS VEIN TO OM 1, 2 AND TO DIAG;  Surgeon: Army Dallas NOVAK, MD;  Location: [facility] OR;  Service: Open Heart Surgery;  Laterality: N/A;   ENDOVEIN HARVEST OF GREATER SAPHENOUS VEIN Right 10/26/2016   Procedure: ENDOVEIN HARVEST OF GREATER SAPHENOUS VEIN;  Surgeon: Army Dallas NOVAK, MD;  Location: [facility] OR;  Service: Open Heart Surgery;  Laterality:  Right;   IR THORACENTESIS ASP PLEURAL SPACE W/IMG GUIDE  11/03/2016   LEFT HEART CATH AND CORONARY ANGIOGRAPHY N/A 10/21/2016   Procedure: Left Heart Cath and Coronary Angiography;  Surgeon: Jordan, Peter M, MD;  Location: [facility] INVASIVE CV LAB;  Service: Cardiovascular;  Laterality: N/A;   TEE WITHOUT CARDIOVERSION N/A 10/26/2016   Procedure: TRANSESOPHAGEAL ECHOCARDIOGRAM (TEE);  Surgeon: Army Dallas NOVAK, MD;  Location: [facility] OR;  Service: Open Heart Surgery;  Laterality: N/A;    Short Social History:  Social History   Tobacco Use   Smoking status: Former    Current packs/day: 0.00    Average packs/day: 0.3 packs/day for 8.0 years (2.0 ttl pk-yrs)    Types: Cigarettes    Start date: 1964    Quit date: 42    Years since quitting: 53.9   Smokeless tobacco: Never  Substance Use Topics   Alcohol use: Not Currently    Allergies[1]  Current Facility-Administered Medications  Medication Dose Route Frequency Provider Last Rate Last Admin   0.9 %  sodium chloride  infusion   Intravenous Continuous Stoltzfus, Gregory P, DO        REVIEW OF SYSTEMS All other systems were reviewed and are negative     Objective:   Objective   Vitals:   05/09/24 0826 05/09/24 0836 05/09/24 0902  BP: (!) (P) 256/110 (!) 156/110   Pulse: (P) 76  77  Resp: (P) 18  17  Temp: (P) 97.9 F (36.6 C)  97.7 F (36.5 C)  TempSrc: (P) Oral  Oral  SpO2: (P) 98%    Weight: (P) 59 kg    Height: (P) 5' 10 (1.778 m)     Body mass index is 18.65 kg/m (pended).  Physical Exam General: no acute distress Cardiac: hemodynamically stable Extremities: No thrill in the left forearm loop graft  Data: Reviewed fistulogram from earlier in the year.  There appears to be a patent and adequately sized basilic vein and a brachial vein at the axilla     Assessment/Plan:   Artavius Stearns is a 73 y.o. male with ESRD with a clotted left forearm loop graft.  We discussed that we can attempt thrombectomy with interposition  from the venous end of the graft to the basilic vein which would restore his current access and avoid a tunneled dialysis catheter.  If this is unsuccessful explained we would need to do a right upper arm brachial to axillary StrataGraft with a tunneled dialysis catheter placement. Risk and benefits were reviewed, he expressed understanding and elected to proceed.   Norman Serve, MD [facility]      [1] No Known Allergies

## 2024-05-10 ENCOUNTER — Encounter (HOSPITAL_COMMUNITY): Payer: Self-pay | Admitting: Vascular Surgery

## 2024-05-10 DIAGNOSIS — Z48812 Encounter for surgical aftercare following surgery on the circulatory system: Secondary | ICD-10-CM

## 2024-05-10 DIAGNOSIS — N186 End stage renal disease: Secondary | ICD-10-CM | POA: Diagnosis not present

## 2024-05-10 NOTE — Plan of Care (Signed)
  Problem: Clinical Measurements: Goal: Will remain free from infection Outcome: Progressing   Problem: Elimination: Goal: Will not experience complications related to urinary retention Outcome: Progressing   Problem: Pain Managment: Goal: General experience of comfort will improve and/or be controlled Outcome: Progressing   Problem: Safety: Goal: Ability to remain free from injury will improve Outcome: Progressing

## 2024-05-10 NOTE — Progress Notes (Addendum)
°  Progress Note    05/10/2024 7:46 AM 1 Day Post-Op  Subjective: left arm a little sore   Vitals:   05/10/24 0435 05/10/24 0500  BP: (!) 177/75 (!) 167/69  Pulse: 67 68  Resp: 18 18  Temp: 98 F (36.7 C) 98 F (36.7 C)  SpO2: 100% 100%   Physical Exam: Cardiac:  regular Lungs:  non labored Incisions:  left AC incision is c/d/I without swelling or hematoma Extremities:  left forearm loop graft with good thrill Neurologic: alert and oriented   CBC    Component Value Date/Time   WBC 8.1 05/09/2024 2026   RBC 3.25 (L) 05/09/2024 2026   HGB 10.2 (L) 05/09/2024 2026   HGB 12.2 (L) 12/17/2019 1243   HCT 31.4 (L) 05/09/2024 2026   HCT Shawn.0 (L) 12/17/2019 1243   PLT 362 05/09/2024 2026   PLT 282 12/17/2019 1243   MCV 96.6 05/09/2024 2026   MCV 91 12/17/2019 1243   MCH 31.4 05/09/2024 2026   MCHC 32.5 05/09/2024 2026   RDW 16.1 (H) 05/09/2024 2026   RDW 12.9 12/17/2019 1243   LYMPHSABS 0.8 03/29/2024 0231   MONOABS 0.8 03/29/2024 0231   EOSABS 0.1 03/29/2024 0231   BASOSABS 0.0 03/29/2024 0231    BMET    Component Value Date/Time   NA 132 (L) 05/09/2024 2026   NA 138 12/17/2019 1243   K 5.5 (H) 05/09/2024 2026   CL 96 (L) 05/09/2024 2026   CO2 19 (L) 05/09/2024 2026   GLUCOSE 253 (H) 05/09/2024 2026   BUN 84 (H) 05/09/2024 2026   BUN 30 (H) 12/17/2019 1243   CREATININE 11.52 (H) 05/09/2024 2026   CREATININE 8.71 (H) 10/11/2022 0000   CALCIUM  8.4 (L) 05/09/2024 2026   GFRNONAA 4 (L) 05/09/2024 2026   GFRAA 31 (L) 12/17/2019 1243    INR    Component Value Date/Time   INR 1.3 (H) 05/09/2024 2026     Intake/Output Summary (Last 24 hours) at 05/10/2024 0746 Last data filed at 05/09/2024 2155 Gross per 24 hour  Intake 620 ml  Output 100 ml  Net 520 ml     Assessment/Plan:  73 y.o. Collins is s/p Open thrombectomy of the left forearm AV loop graft; Revision of left forearm loop graft with interposition grafting from the venous limb to the basilic vein,  7 mm PTFE 1 Day Post-Op   Left forearm loop graft with good thrill. Okay for use immediately Incision is clean, dry and intact Stable for discharge home today He has a friend that can come pick him up He normally dialyzes on MWF with his seat at 10:30. He hopefully can make his chair this morning He has follow up arranged in our office in 2 weeks  Teretha Damme, [place] [facility] [phone] 05/10/2024 7:46 AM   I agree with above. Ok for discharge this morning.   Norman GORMAN Serve MD [facility] Phone Number: [phone] 05/10/2024 8:24 AM

## 2024-05-13 NOTE — Discharge Summary (Signed)
 Discharge Summary    Shawn Collins [birth year] 73 y.o. male  [id number]  Admission Date: 05/09/2024  Discharge Date: 05/10/2024  Physician: Dr. Norman Serve  Admission Diagnosis: ESRD (end stage renal disease) on dialysis [facility]) [N18.6, Z99.2]   HPI:   This is a 73 y.o. male with ESRD presenting with clotted left forearm graft. He underwent dialysis on Monday 05/06/24 but on Wednesday when presenting to dialysis he was noted to have a clotted access.   Hospital Course:  The patient was admitted to the hospital and taken to the operating room on 05/09/2024 and underwent:Open thrombectomy of the left forearm AV loop graft and Revision of left forearm loop graft with interposition grafting from the venous limb to the basilic vein, 7 mm PTFE by Dr. Serve.    The pt tolerated the procedure well and was transported to the PACU in good condition.   He was kept for overnight observation as he lives independently and did not have anyone to stay with him  No overnight events. Left forearm incision remained intact and well appearing without swelling or hematoma. Left forearm loop graft with good thrill. No signs or symptoms of steal. He remained stable for discharge home POD#1. He will go to his outpatient hemodialysis per his usual schedule. Post operative pain medication was sent to the transition of care pharmacy. He will have follow up arranged in our office in 2 weeks   CBC    Component Value Date/Time   WBC 8.1 05/09/2024 2026   RBC 3.25 (L) 05/09/2024 2026   HGB 10.2 (L) 05/09/2024 2026   HGB 12.2 (L) 12/17/2019 1243   HCT 31.4 (L) 05/09/2024 2026   HCT 37.0 (L) 12/17/2019 1243   PLT 362 05/09/2024 2026   PLT 282 12/17/2019 1243   MCV 96.6 05/09/2024 2026   MCV 91 12/17/2019 1243   MCH 31.4 05/09/2024 2026   MCHC 32.5 05/09/2024 2026   RDW 16.1 (H) 05/09/2024 2026   RDW 12.9 12/17/2019 1243   LYMPHSABS 0.8 03/29/2024 0231   MONOABS 0.8 03/29/2024 0231   EOSABS 0.1  03/29/2024 0231   BASOSABS 0.0 03/29/2024 0231    BMET    Component Value Date/Time   NA 132 (L) 05/09/2024 2026   NA 138 12/17/2019 1243   K 5.5 (H) 05/09/2024 2026   CL 96 (L) 05/09/2024 2026   CO2 19 (L) 05/09/2024 2026   GLUCOSE 253 (H) 05/09/2024 2026   BUN 84 (H) 05/09/2024 2026   BUN 30 (H) 12/17/2019 1243   CREATININE 11.52 (H) 05/09/2024 2026   CREATININE 8.71 (H) 10/11/2022 0000   CALCIUM  8.4 (L) 05/09/2024 2026   GFRNONAA 4 (L) 05/09/2024 2026   GFRAA 31 (L) 12/17/2019 1243      Discharge Instructions     Call MD for:   Complete by: As directed    Pain in arm or hand that is unrelieved with pain medication, weakness, numbness, tingling, coldness or discoloration of forearm or hand   Call MD for:  redness, tenderness, or signs of infection (pain, swelling, bleeding, redness, odor or green/yellow discharge around incision site)   Complete by: As directed    Call MD for:  redness, tenderness, or signs of infection (pain, swelling, redness, odor or green/yellow discharge around incision site)   Complete by: As directed    Call MD for:  severe or increased pain, loss or decreased feeling  in affected limb(s)   Complete by: As directed    Call MD for:  severe uncontrolled pain   Complete by: As directed    Call MD for:  temperature >100.4   Complete by: As directed    Call MD for:  temperature >100.5   Complete by: As directed    Discharge wound care:   Complete by: As directed    Keep incision dry for 24 hours. You can then wash with mild soap and water, pat dry. Do not soak in bathtub, pool, etc   Driving Restrictions   Complete by: As directed    No driving while taking narcotic pain medication   Increase activity slowly   Complete by: As directed    Increase activity slowly   Complete by: As directed    Walk with assistance use walker or cane as needed   Lifting restrictions   Complete by: As directed    No lifting for 4 weeks   Resume previous diet    Complete by: As directed        Discharge Diagnosis:  ESRD (end stage renal disease) on dialysis (HCC) [N18.6, Z99.2]  Secondary Diagnosis: Patient Active Problem List   Diagnosis Date Noted   Hypertensive emergency 03/23/2024   Cognitive impairment 03/22/2024   Dry gangrene (HCC) 03/22/2024   HTN (hypertension) 09/13/2023   COVID-19 07/10/2023   Hyperkalemia 07/10/2023   Metabolic acidosis with increased anion gap and accumulation of organic acids 07/10/2023   Hypertensive urgency 07/10/2023   Hypoglycemia 07/10/2023   Weakness 07/10/2023   Syncope 07/05/2022   Syncope and collapse 07/04/2022   Type 2 diabetes mellitus (HCC) 07/04/2022   Anemia of chronic disease 07/04/2022   Acute metabolic encephalopathy due to hypoglycemia 12/30/2021   Hypothermia 12/30/2021   Uncontrolled type 2 diabetes mellitus with hypoglycemia, with long-term current use of insulin  (HCC) 12/30/2021   History of CVA (cerebrovascular accident) 12/30/2021   DNR (do not resuscitate) 12/30/2021   Stroke determined by clinical assessment (HCC) 12/18/2021   ESRD (end stage renal disease) on dialysis (HCC) 02/23/2021   DOE (dyspnea on exertion) 11/22/2017   Hyperlipidemia LDL goal <70 04/17/2017   CAD (coronary artery disease)    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 09/10/2014   Diabetes mellitus without complication (HCC) 09/10/2014   Hypertension 09/10/2014   Emphysema lung (HCC) 09/10/2014   Past Medical History:  Diagnosis Date   CAD (coronary artery disease), native coronary artery    s/p NSTEMI  with cath showing severe 3VD s/p  CABG x 3 with LIMA to LAD, SVG to OM1/OM2 and SVG D1 by Dr. Army on 10/26/16.   COPD (chronic obstructive pulmonary disease) (HCC) 09/10/2014   CVA (cerebral vascular accident) (HCC) 12/18/2021   Diabetes mellitus without complication (HCC)    Type II   ESRD (end stage renal disease) (HCC)    Hyperlipidemia LDL goal <70 04/17/2017   Hypertension      Allergies as of 05/10/2024   No Known Allergies      Medication List     STOP taking these medications    HYDROcodone -acetaminophen  5-325 MG tablet Commonly known as: NORCO/VICODIN       TAKE these  medications    amLODipine  10 MG tablet Commonly known as: NORVASC  Take 1 tablet (10 mg total) by mouth at bedtime.   aspirin  81 MG chewable tablet Chew 1 tablet (81 mg total) by mouth daily.   atorvastatin  40 MG tablet Commonly known as: LIPITOR  Take 1 tablet (40 mg total) by mouth at bedtime.   cloNIDine  0.1 MG tablet Commonly known as: CATAPRES  Take 1 tablet (0.1 mg total) by mouth 2 (two) times daily.   clopidogrel  75 MG tablet Commonly known as: PLAVIX  Take 75 mg by mouth daily.   feeding supplement (NEPRO CARB STEADY) Liqd Take 237 mLs by mouth 3 (three) times daily between meals.   hydrALAZINE  50 MG tablet Commonly known as: APRESOLINE  Take 1 tablet (50 mg total) by mouth every 8 (eight) hours.   losartan  100 MG tablet Commonly known as: COZAAR  Take 1 tablet (100 mg total) by mouth daily.   nitroGLYCERIN  0.4 MG SL tablet Commonly known as: NITROSTAT  Place 0.4 mg under the tongue every 5 (five) minutes as needed for chest pain.   oxyCODONE  5 MG immediate release tablet Commonly known as: Roxicodone  Take 1 tablet (5 mg total) by mouth every 4 (four) hours as needed for severe pain (pain score 7-10).   sevelamer  carbonate 800 MG tablet Commonly known as: RENVELA  Take 800-1,600 mg by mouth See admin instructions. Take 2 tablets by mouth tid daily with meals and 1 tablet  bid with snacks   spironolactone  25 MG tablet Commonly known as: ALDACTONE  Take 25 mg by mouth daily.               Discharge Care Instructions  (From admission, onward)           Start     Ordered   05/10/24 0000  Discharge wound care:       Comments: Keep incision dry for 24 hours. You can then wash with mild soap and water, pat dry. Do not soak in bathtub, pool, etc   05/10/24 0749             Instructions: [facility] Discharge Instructions AV Fistula or Graft Surgery for Dialysis Access  Please refer to the following instructions for your post-procedure care. Your surgeon or physician assistant will discuss any changes with you.  Activity  You may drive the day following your surgery, if you are comfortable and no longer taking prescription pain medication. Resume full activity as the soreness in your incision resolves.  Bathing/Showering  You may shower after you go home. Keep your incision dry for 48 hours. Do not soak in a bathtub, hot tub, or swim until the incision heals completely. You may not shower if you have a hemodialysis catheter.  Incision Care  Clean your incision with mild soap and water after 48 hours. Pat the area dry with a clean towel. You do not need a bandage unless otherwise instructed. Do not apply any ointments or creams to your incision. You may have skin glue on your incision. Do not peel it off. It will come off on its own in about one week. Your arm may swell a bit after surgery. To reduce swelling use pillows to elevate your arm so it is above your heart. Your doctor will tell you if you need to lightly wrap your arm with an ACE bandage.  Diet  Resume your normal diet. There are not special food restrictions following this procedure. In order to heal from your surgery, it  is CRITICAL to get adequate nutrition. Your body requires vitamins, minerals, and protein. Vegetables are the best source of  vitamins and minerals. Vegetables also provide the perfect balance of protein. Processed food has little nutritional value, so try to avoid this.  Medications  Resume taking all of your medications. If your incision is causing pain, you may take over-the counter pain relievers such as acetaminophen  (Tylenol ). If you were prescribed a stronger pain medication, please be aware these medications can cause nausea and constipation. Prevent nausea by taking the medication with a snack or meal. Avoid constipation by drinking plenty of fluids and eating foods with high amount of fiber, such as fruits, vegetables, and grains. Do not take Tylenol  if you are taking prescription pain medications.  Follow up Your surgeon may want to see you in the office following your access surgery. If so, this will be arranged at the time of your surgery.  Please call us  immediately for any of the following conditions:  Increased pain, redness, drainage (pus) from your incision site Fever of 101 degrees or higher Severe or worsening pain at your incision site Hand pain or numbness.  Reduce your risk of vascular disease:  Stop smoking. If you would like help, call [facility] at 1-800-QUIT-NOW (8655554690) or [place] at 5202313014  Manage your cholesterol Maintain a desired weight Control your diabetes Keep your blood pressure down  Dialysis  It will take several weeks to several months for your new dialysis access to be ready for use. Your surgeon will determine when it is OK to use it. Your nephrologist will continue to direct your dialysis. You can continue to use your Permcath until your new access is ready for use.   05/13/2024 Astin Sayre [id number] [birth year]  Surgeon(s): Pearline Norman RAMAN, MD  Procedures: THROMBECTOMY AND REVISION OF LEFT FOREARM ARTERIOVENOUS GORE-TEX GRAFT, USING GORE-TEX 7 MM STRETCH AS INTERPOSITION GRAFT   May stick graft immediately   May stick graft on designated  area only:   X Do not stick left AV loop graft for 4 weeks    If you have any questions, please call the office at [phone].  Prescriptions given: Oxycodone  5 mg IR # 10 No Refill.  PDMP reviewed  Disposition: home  Patient's condition: is Good  Follow up: 1. [facility] in 2 weeks   Curry Damme, [place] [facility] [phone] 05/13/2024  3:22 PM

## 2024-05-20 ENCOUNTER — Emergency Department (HOSPITAL_COMMUNITY)
Admission: EM | Admit: 2024-05-20 | Discharge: 2024-05-21 | Disposition: A | Discharge status: Home / Self Care | Attending: Emergency Medicine | Admitting: Emergency Medicine

## 2024-05-20 ENCOUNTER — Other Ambulatory Visit: Payer: Self-pay

## 2024-05-20 DIAGNOSIS — E1122 Type 2 diabetes mellitus with diabetic chronic kidney disease: Secondary | ICD-10-CM | POA: Insufficient documentation

## 2024-05-20 DIAGNOSIS — Z7902 Long term (current) use of antithrombotics/antiplatelets: Secondary | ICD-10-CM | POA: Insufficient documentation

## 2024-05-20 DIAGNOSIS — Z7982 Long term (current) use of aspirin: Secondary | ICD-10-CM | POA: Insufficient documentation

## 2024-05-20 DIAGNOSIS — E871 Hypo-osmolality and hyponatremia: Secondary | ICD-10-CM | POA: Diagnosis not present

## 2024-05-20 DIAGNOSIS — H547 Unspecified visual loss: Secondary | ICD-10-CM | POA: Insufficient documentation

## 2024-05-20 DIAGNOSIS — R001 Bradycardia, unspecified: Secondary | ICD-10-CM | POA: Insufficient documentation

## 2024-05-20 DIAGNOSIS — I120 Hypertensive chronic kidney disease with stage 5 chronic kidney disease or end stage renal disease: Secondary | ICD-10-CM | POA: Diagnosis not present

## 2024-05-20 DIAGNOSIS — Z992 Dependence on renal dialysis: Secondary | ICD-10-CM | POA: Insufficient documentation

## 2024-05-20 DIAGNOSIS — N186 End stage renal disease: Secondary | ICD-10-CM | POA: Insufficient documentation

## 2024-05-20 DIAGNOSIS — D631 Anemia in chronic kidney disease: Secondary | ICD-10-CM | POA: Diagnosis not present

## 2024-05-20 DIAGNOSIS — Z79899 Other long term (current) drug therapy: Secondary | ICD-10-CM | POA: Insufficient documentation

## 2024-05-20 DIAGNOSIS — J449 Chronic obstructive pulmonary disease, unspecified: Secondary | ICD-10-CM | POA: Insufficient documentation

## 2024-05-20 LAB — BASIC METABOLIC PANEL WITH GFR
Anion gap: 14 (ref 5–15)
BUN: 40 mg/dL — ABNORMAL HIGH (ref 8–23)
CO2: 25 mmol/L (ref 22–32)
Calcium: 9.5 mg/dL (ref 8.9–10.3)
Chloride: 95 mmol/L — ABNORMAL LOW (ref 98–111)
Creatinine, Ser: 6.6 mg/dL — ABNORMAL HIGH (ref 0.61–1.24)
GFR, Estimated: 8 mL/min — ABNORMAL LOW
Glucose, Bld: 101 mg/dL — ABNORMAL HIGH (ref 70–99)
Potassium: 4.9 mmol/L (ref 3.5–5.1)
Sodium: 134 mmol/L — ABNORMAL LOW (ref 135–145)

## 2024-05-20 LAB — CBC WITH DIFFERENTIAL/PLATELET
Abs Immature Granulocytes: 0.02 K/uL (ref 0.00–0.07)
Basophils Absolute: 0.1 K/uL (ref 0.0–0.1)
Basophils Relative: 1 %
Eosinophils Absolute: 0.2 K/uL (ref 0.0–0.5)
Eosinophils Relative: 3 %
HCT: 33.2 % — ABNORMAL LOW (ref 39.0–52.0)
Hemoglobin: 10 g/dL — ABNORMAL LOW (ref 13.0–17.0)
Immature Granulocytes: 0 %
Lymphocytes Relative: 15 %
Lymphs Abs: 0.9 K/uL (ref 0.7–4.0)
MCH: 31.4 pg (ref 26.0–34.0)
MCHC: 30.1 g/dL (ref 30.0–36.0)
MCV: 104.4 fL — ABNORMAL HIGH (ref 80.0–100.0)
Monocytes Absolute: 0.7 K/uL (ref 0.1–1.0)
Monocytes Relative: 11 %
Neutro Abs: 4.4 K/uL (ref 1.7–7.7)
Neutrophils Relative %: 70 %
Platelets: 307 K/uL (ref 150–400)
RBC: 3.18 MIL/uL — ABNORMAL LOW (ref 4.22–5.81)
RDW: 16.3 % — ABNORMAL HIGH (ref 11.5–15.5)
WBC: 6.3 K/uL (ref 4.0–10.5)
nRBC: 0 % (ref 0.0–0.2)

## 2024-05-20 NOTE — ED Triage Notes (Signed)
 Patient with progressive vision loss over the last several years. Almost all vision in R eye gone and recently has noticed decline in L eye vision. Concerned that he is unable to do perform basic tasks at home due to this. States he is feeling nervous.

## 2024-05-20 NOTE — ED Provider Triage Note (Addendum)
 Emergency Medicine Provider Triage Evaluation Note  Gifford Ballon , a 73 y.o. male  was evaluated in triage.  Pt complains of bilat vision loss. R eye 5 months ago. L eye worse over past week. No injuries to either eye. ESRD last session yest  Review of Systems  Positive:  Negative:   Physical Exam  BP (!) 184/72 (BP Location: Right Arm)   Pulse 62   Temp 98.9 F (37.2 C) (Oral)   Resp 14   SpO2 97%  Gen:   Awake, no distress   Resp:  Normal effort  MSK:   Moves extremities without difficulty  Other:    Medical Decision Making  Medically screening exam initiated at 7:41 PM.  Appropriate orders placed.  Kylen Schliep was informed that the remainder of the evaluation will be completed by another provider, this initial triage assessment does not replace that evaluation, and the importance of remaining in the ED until their evaluation is complete.     Pamella Ozell LABOR, DO 05/20/24 1943    Pamella Ozell LABOR, DO 05/20/24 1944

## 2024-05-21 ENCOUNTER — Emergency Department (HOSPITAL_COMMUNITY)
Admission: EM | Admit: 2024-05-21 | Discharge: 2024-05-22 | Disposition: A | Discharge status: Home / Self Care | Source: Home / Self Care | Attending: Emergency Medicine | Admitting: Emergency Medicine

## 2024-05-21 ENCOUNTER — Ambulatory Visit: Admitting: Podiatry

## 2024-05-21 ENCOUNTER — Emergency Department (HOSPITAL_COMMUNITY)

## 2024-05-21 ENCOUNTER — Encounter (HOSPITAL_COMMUNITY): Payer: Self-pay

## 2024-05-21 DIAGNOSIS — Z7902 Long term (current) use of antithrombotics/antiplatelets: Secondary | ICD-10-CM | POA: Insufficient documentation

## 2024-05-21 DIAGNOSIS — G479 Sleep disorder, unspecified: Secondary | ICD-10-CM | POA: Insufficient documentation

## 2024-05-21 DIAGNOSIS — R600 Localized edema: Secondary | ICD-10-CM | POA: Insufficient documentation

## 2024-05-21 DIAGNOSIS — Z7982 Long term (current) use of aspirin: Secondary | ICD-10-CM | POA: Insufficient documentation

## 2024-05-21 MED ORDER — ACETAMINOPHEN 325 MG PO TABS
ORAL_TABLET | ORAL | Status: AC
  Filled 2024-05-21: qty 2

## 2024-05-21 MED ORDER — HYDRALAZINE HCL 20 MG/ML IJ SOLN
10.0000 mg | Freq: Once | INTRAMUSCULAR | Status: AC
  Administered 2024-05-21: 10 mg via INTRAVENOUS
  Filled 2024-05-21: qty 1

## 2024-05-21 MED ORDER — CLONIDINE HCL 0.2 MG PO TABS
0.2000 mg | ORAL_TABLET | Freq: Once | ORAL | Status: AC
  Administered 2024-05-21: 0.2 mg via ORAL
  Filled 2024-05-21: qty 1

## 2024-05-21 MED ORDER — ACETAMINOPHEN 325 MG PO TABS
650.0000 mg | ORAL_TABLET | Freq: Once | ORAL | Status: AC
  Administered 2024-05-21: 650 mg via ORAL

## 2024-05-21 NOTE — Progress Notes (Signed)
 Resources for additional caregiver assistance added to AVS.

## 2024-05-21 NOTE — ED Notes (Signed)
Attempted x2 for PIV, unsuccessful.

## 2024-05-21 NOTE — Evaluation (Signed)
 Physical Therapy Evaluation Patient Details Name: Othniel Maret MRN: [id number] DOB: 09/13/1950 Today's Date: 05/21/2024  History of Present Illness  73 y.o. male presents to [facility] hospital on 05/20/2024 with worsening vision and difficult performing ADLs at home. PMH: COPD, DM2, HTN, Hx of NSTEMI, CKD/ESRD (HD MWF), CAD, CVA  Clinical Impression  Pt presents to PT with deficits in vision, gait, balance, strength, power. Pt reports chronic vision deficits in R eye with more acute decline in vision in L eye. Brief vision assessment performed by PT is documented in general comments portion of this note. Pt demonstrates improved stability with use of a RW when ambulating at this time. PT attempts to assist the pt in improving accessibility on his cell phone due to low vision. PT was able to increase font size on the phone but was unable to activate google voice assistant as the pt does not currently have an account setup at this time. The voice assistant would make phone use much more simple for Mr. Keep as he could make phone calls or utilize phone functions without needing to find specific buttons to push. PT recommends HHPT follow-up along with use of a RW when the patient discharges.        If plan is discharge home, recommend the following: Assistance with cooking/housework;Assist for transportation;Help with stairs or ramp for entrance   Can travel by private vehicle        Equipment Recommendations Rolling walker (2 wheels)  Recommendations for Other Services       Functional Status Assessment Patient has had a recent decline in their functional status and demonstrates the ability to make significant improvements in function in a reasonable and predictable amount of time.     Precautions / Restrictions Precautions Precautions: Fall Recall of Precautions/Restrictions: Intact Precaution/Restrictions Comments: low vision L eye, very low vision R eye Restrictions Weight Bearing  Restrictions Per Provider Order: No      Mobility  Bed Mobility Overal bed mobility: Modified Independent                  Transfers Overall transfer level: Needs assistance Equipment used: Rolling walker (2 wheels), Straight cane, None Transfers: Sit to/from Stand Sit to Stand: Supervision, Modified independent (Device/Increase time)           General transfer comment: supervision without DME and with SPC, modI with RW    Ambulation/Gait Ambulation/Gait assistance: Supervision Gait Distance (Feet): 60 Feet (60' x 2, initial trial with SPC, following trial with RW) Assistive device: Rolling walker (2 wheels), Straight cane Gait Pattern/deviations: Step-through pattern, Staggering left, Staggering right Gait velocity: reduced Gait velocity interpretation: <1.8 ft/sec, indicate of risk for recurrent falls   General Gait Details: pt with slowed step-through gait, multiple instances of staggering laterally with use of SPC. Much improved stability with use of RW, no significant LOB noted  Stairs            Wheelchair Mobility     Tilt Bed    Modified Rankin (Stroke Patients Only)       Balance Overall balance assessment: Needs assistance Sitting-balance support: No upper extremity supported, Feet supported Sitting balance-Leahy Scale: Good     Standing balance support: Single extremity supported, Reliant on assistive device for balance Standing balance-Leahy Scale: Poor                               Pertinent Vitals/Pain Pain Assessment Pain Assessment:  No/denies pain    Home Living Family/patient expects to be discharged to:: Private residence Living Arrangements: Alone Available Help at Discharge: Friend(s);Available PRN/intermittently Gennie) Type of Home: Apartment Home Access: Stairs to enter Entrance Stairs-Rails: Right Entrance Stairs-Number of Steps: 3   Home Layout: One level Home Equipment: Cane - single point       Prior Function Prior Level of Function : Needs assist             Mobility Comments: ambulates with SPC, reports 3 falls in the last 3 months ADLs Comments: independent with ADLs, has not been driving recently due to vision impairment     Extremity/Trunk Assessment   Upper Extremity Assessment Upper Extremity Assessment: Overall WFL for tasks assessed    Lower Extremity Assessment Lower Extremity Assessment: Overall WFL for tasks assessed (grossly 4/5)    Cervical / Trunk Assessment Cervical / Trunk Assessment: Normal  Communication   Communication Communication: No apparent difficulties    Cognition Arousal: Alert Behavior During Therapy: WFL for tasks assessed/performed   PT - Cognitive impairments: Problem solving                       PT - Cognition Comments: slowed processing Following commands: Intact       Cueing Cueing Techniques: Verbal cues     General Comments General comments (skin integrity, edema, etc.): VSS on RA, Pt is only able to see shadows out of R eye. Pt able to consistently identify number of fingers PT is holding up with L hand 4/4. PT notes difficulty tracking objects in R visual field. Pt does report some vision improvement since arrival to ED    Exercises     Assessment/Plan    PT Assessment Patient needs continued PT services  PT Problem List Decreased balance;Decreased mobility;Decreased knowledge of use of DME;Decreased safety awareness       PT Treatment Interventions DME instruction;Gait training;Stair training;Functional mobility training;Therapeutic activities;Therapeutic exercise;Balance training;Neuromuscular re-education;Patient/family education    PT Goals (Current goals can be found in the Care Plan section)  Acute Rehab PT Goals Patient Stated Goal: to find different housing, reduce falls risk and maintain independence with mobility and ADLs PT Goal Formulation: With patient Time For Goal Achievement:  06/04/24 Potential to Achieve Goals: Fair Additional Goals Additional Goal #1: Pt will score >19/24 on the DGI to indicate a reduced risk for falls    Frequency Min 2X/week     Co-evaluation               AM-PAC PT 6 Clicks Mobility  Outcome Measure Help needed turning from your back to your side while in a flat bed without using bedrails?: None Help needed moving from lying on your back to sitting on the side of a flat bed without using bedrails?: None Help needed moving to and from a bed to a chair (including a wheelchair)?: A Little Help needed standing up from a chair using your arms (e.g., wheelchair or bedside chair)?: A Little Help needed to walk in hospital room?: A Little Help needed climbing 3-5 steps with a railing? : A Little 6 Click Score: 20    End of Session Equipment Utilized During Treatment: Gait belt Activity Tolerance: Patient tolerated treatment well Patient left: in bed Nurse Communication: Mobility status PT Visit Diagnosis: Other abnormalities of gait and mobility (R26.89);Other symptoms and signs involving the nervous system (M70.101)    Time: 9187-9152 PT Time Calculation (min) (ACUTE ONLY): 35 min  Charges:   PT Evaluation $PT Eval Low Complexity: 1 Low   PT General Charges $$ ACUTE PT VISIT: 1 Visit         Bernardino JINNY Ruth, PT, DPT Acute Rehabilitation Office [phone]   Bernardino JINNY Ruth 05/21/2024, 9:15 AM

## 2024-05-21 NOTE — Progress Notes (Signed)
 Physical Therapy Quick Note  PT has completed initial evaluation.    Overall, patient at supervision assistance level.   PT Follow up recommended: Home Health PT Equipment recommended:  Rolling walker Complete evaluation note to follow.     Bernardino JINNY Ruth, PT, DPT Acute Rehabilitation Office [phone]

## 2024-05-21 NOTE — ED Provider Notes (Signed)
 " [place] [facility]ERGENCY DEPARTMENT AT [facility] Provider Note   CSN: [id number] Arrival date & time: 05/20/24  1801     Patient presents with: Loss of Vision   Shawn Collins is a 73 y.o. male patient initial complaint with worsening vision loss over the last several years, nearly complete loss of vision in the right eye, has noticed decline in left eye vision over the last week.  Difficult to perform tasks at home due to this.  He has history of ESRD, poorly controlled blood pressure.  He has history of having difficulty taking his medications due to his vision loss, multiple comorbidities, and lack of help.  Patient reports that he does not have anybody that lives with him and so it is difficult to take his medications and get treatment the way he needs.  He thinks he may need placement in a facility.  He denies any new numbness, tingling, chest pain, shortness of breath.   HPI     Prior to Admission medications  Medication Sig Start Date End Date Taking? Authorizing Provider  amLODipine  (NORVASC ) 10 MG tablet Take 1 tablet (10 mg total) by mouth at bedtime. 07/20/23   Rojelio Nest, DO  aspirin  81 MG chewable tablet Chew 1 tablet (81 mg total) by mouth daily. 07/20/23   Rojelio Nest, DO  atorvastatin  (LIPITOR ) 40 MG tablet Take 1 tablet (40 mg total) by mouth at bedtime. 07/20/23   Rojelio Nest, DO  cloNIDine  (CATAPRES ) 0.1 MG tablet Take 1 tablet (0.1 mg total) by mouth 2 (two) times daily. Patient not taking: Reported on 05/09/2024 03/29/24   Tobie Yetta HERO, MD  clopidogrel  (PLAVIX ) 75 MG tablet Take 75 mg by mouth daily. 01/14/24   [provider]  hydrALAZINE  (APRESOLINE ) 50 MG tablet Take 1 tablet (50 mg total) by mouth every 8 (eight) hours. 07/20/23   Rojelio Nest, DO  losartan  (COZAAR ) 100 MG tablet Take 1 tablet (100 mg total) by mouth daily. 07/20/23   Rojelio Nest, DO  nitroGLYCERIN  (NITROSTAT ) 0.4 MG SL tablet Place 0.4 mg under the tongue every 5  (five) minutes as needed for chest pain.    [provider]  Nutritional Supplements (FEEDING SUPPLEMENT, NEPRO CARB STEADY,) LIQD Take 237 mLs by mouth 3 (three) times daily between meals. 03/29/24   Tobie Yetta HERO, MD  oxyCODONE  (ROXICODONE ) 5 MG immediate release tablet Take 1 tablet (5 mg total) by mouth every 4 (four) hours as needed for severe pain (pain score 7-10). 05/09/24   Schuh, McKenzi P, PA-C  sevelamer  carbonate (RENVELA ) 800 MG tablet Take 800-1,600 mg by mouth See admin instructions. Take 2 tablets by mouth tid daily with meals and 1 tablet bid with snacks 04/02/24   [provider]  spironolactone  (ALDACTONE ) 25 MG tablet Take 25 mg by mouth daily. 08/23/23   [provider]    Allergies: Patient has no known allergies.    Review of Systems  All other systems reviewed and are negative.   Updated Vital Signs BP (!) 152/62   Pulse (!) 50   Temp (!) 97.2 F (36.2 C) (Oral)   Resp 14   Ht 5' 10 (1.778 m)   Wt 61.2 kg   SpO2 100%   BMI 19.37 kg/m   Physical Exam Vitals and nursing note reviewed.  Constitutional:      General: He is not in acute distress.    Appearance: Normal appearance.  HENT:     Head: Normocephalic and atraumatic.  Eyes:     General:        Right eye: No discharge.        Left eye: No discharge.  Cardiovascular:     Rate and Rhythm: Normal rate and regular rhythm.     Heart sounds: No murmur heard.    No friction rub. No gallop.  Pulmonary:     Effort: Pulmonary effort is normal.     Breath sounds: Normal breath sounds.  Abdominal:     General: Bowel sounds are normal.     Palpations: Abdomen is soft.  Skin:    General: Skin is warm and dry.     Capillary Refill: Capillary refill takes less than 2 seconds.  Neurological:     Mental Status: He is alert and oriented to person, place, and time.     Comments: Answers all questions overall appropriately, moving all 4 limbs spontaneously, does have some signs of  cognitive decline, overall appropriate visual exam on the left, no double vision  Psychiatric:        Mood and Affect: Mood normal.        Behavior: Behavior normal.     (all labs ordered are listed, but only abnormal results are displayed) Labs Reviewed  BASIC METABOLIC PANEL WITH GFR - Abnormal; Notable for the following components:      Result Value   Sodium 134 (*)    Chloride 95 (*)    Glucose, Bld 101 (*)    BUN 40 (*)    Creatinine, Ser 6.60 (*)    GFR, Estimated 8 (*)    All other components within normal limits  CBC WITH DIFFERENTIAL/PLATELET - Abnormal; Notable for the following components:   RBC 3.18 (*)    Hemoglobin 10.0 (*)    HCT 33.2 (*)    MCV 104.4 (*)    RDW 16.3 (*)    All other components within normal limits    EKG: EKG Interpretation Date/Time:  Monday May 20 2024 19:56:06 EST Ventricular Rate:  59 PR Interval:  148 QRS Duration:  82 QT Interval:  426 QTC Calculation: 421 R Axis:   29  Text Interpretation: Sinus bradycardia Minimal voltage criteria for LVH, may be normal variant ( Cornell product ) Nonspecific ST and T wave abnormality Abnormal ECG Confirmed by Melvenia Motto [phone]) on 05/21/2024 3:39:39 AM  Radiology: CT Head Wo Contrast Result Date: 05/21/2024 EXAM: CT HEAD WITHOUT CONTRAST 05/21/2024 03:49:41 AM TECHNIQUE: CT of the head was performed without the administration of intravenous contrast. Automated exposure control, iterative reconstruction, and/or weight based adjustment of the mA/kV was utilized to reduce the radiation dose to as low as reasonably achievable. COMPARISON: Comparison with 03/22/2024. CLINICAL HISTORY: Neuro deficit, acute, stroke suspected. FINDINGS: BRAIN AND VENTRICLES: No acute hemorrhage. No evidence of acute infarct. No hydrocephalus. No extra-axial collection. No mass effect or midline shift. Chronic microvascular ischemia and generalized atrophy. ORBITS: No acute abnormality. SINUSES: No acute abnormality. SOFT  TISSUES AND SKULL: No acute soft tissue abnormality. No skull fracture. IMPRESSION: 1. No acute intracranial abnormality. Electronically signed by: Norman Gatlin MD 05/21/2024 04:00 AM EST RP Workstation: [id number]     Procedures   Medications Ordered in the ED  acetaminophen  (TYLENOL ) tablet 650 mg (650 mg Oral Given 05/21/24 0216)  hydrALAZINE  (APRESOLINE ) injection 10 mg (10 mg Intravenous Given 05/21/24 0332)  cloNIDine  (CATAPRES ) tablet 0.2 mg (0.2 mg Oral Given 05/21/24 0306)  Medical Decision Making Amount and/or Complexity of Data Reviewed Radiology: ordered.  Risk OTC drugs. Prescription drug management.   This patient is a 73 y.o. male  who presents to the ED for concern of visual changes.   Differential diagnoses prior to evaluation: The emergent differential diagnosis includes, but is not limited to, chronic hypertensive/diabetic retinopathy, stroke, retinal detachment, vitreous hemorrhage, CRAO, versus other acute ocular emergency. This is not an exhaustive differential.   Past Medical History / Co-morbidities / Social History: COPD, diabetes, hypertension, ESRD on dialysis, cognitive impairment  Additional history: Chart reviewed. Pertinent results include: Reviewed lab work, imaging from recent previous emergency department visits, patient was admitted somewhat recently for hypertensive emergency, thought to be secondary largely to poor medical compliance during his last evaluation.  Physical Exam: Physical exam performed. The pertinent findings include: Answers all questions overall appropriately, moving all 4 limbs spontaneously, does have some signs of cognitive decline, overall appropriate visual exam on the left, no double vision  Initially quite hypertensive, blood pressure max 226/82, proved to 152/62 with home medication, hydralazine , clonidine   Lab Tests/Imaging studies: I personally interpreted labs/imaging and the  pertinent results include: CBC overall unremarkable, mild anemia, hemoglobin 10.0.  BMP is consistent with ESRD on dialysis with chronically elevated BUN, creatinine but no critical hyperkalemia, very mild hyponatremia, sodium 134. CT head without contrast shows no evidence of acute intracranial abnormality. I agree with the radiologist interpretation.  Cardiac monitoring: EKG obtained and interpreted by myself and attending physician which shows: Mild sinus bradycardia, no acute ST-T changes   Medications: I ordered medication including Tylenol  for pain, hydralazine , clonidine  for elevated blood pressure.  I have reviewed the patients home medicines and have made adjustments as needed.   Disposition: After consideration of the diagnostic results and the patients response to treatment, I feel that patient does not meet criteria for hospital admission at this time, but due to his decreased capacity to take care of himself at home will plan for [facility] eval for SNF placement.   Final diagnoses:  None    ED Discharge Orders     None          Rosan Sherlean VEAR DEVONNA 05/21/24 0450    Melvenia Motto, MD 05/21/24 [phone]  "

## 2024-05-21 NOTE — ED Triage Notes (Addendum)
 BIB [facility], EMS states that they were called out for eye pain. Left eye blurry, also c/o generalized weakness after dialysis. Pt states that he is afraid to be at home, states that he does not want to be at home by himself per EMS. Pt lives in boarding.   BP 192/74 HR 68 Spo2 98% RA CBG 115

## 2024-05-21 NOTE — ED Notes (Signed)
 Ryan with [employer] at bedside with walker for pt.

## 2024-05-21 NOTE — TOC Initial Note (Signed)
 Transition of Care [facility]) - Initial/Assessment Note    Patient Details  Name: Shawn Collins MRN: [id number] Date of Birth: 12/22/1950  [facility] of [facility]) CM/SW Contact:    Debarah Saunas, RN Phone Number: 05/21/2024, 9:49 AM  Clinical Narrative:                 Pt qualifies for DME (Durable Medical Equipment) rolling walker.  DME ordered through [employer].  Ryan, liaison of [employer] notified to deliver DME to pt room prior to D/C home.  DME agency information placed on After Visit Summary (AVS) for pt to refer to with any DME questions after discharge. Saunas Debarah, BSN, RN, [facility] [phone]     Barriers to Discharge: ED DME delivery   Patient Goals and CMS Choice Patient states their goals for this hospitalization and ongoing recovery are:: go home with resumption on home health sevices and DME rolling walker          Expected Discharge Plan and Services                                              Prior Living Arrangements/Services                       Activities of Daily Living      Permission Sought/Granted                  Emotional Assessment              Admission diagnosis:  Decreased Vision Patient Active Problem List   Diagnosis Date Noted   Hypertensive emergency 03/23/2024   Cognitive impairment 03/22/2024   Dry gangrene (HCC) 03/22/2024   HTN (hypertension) 09/13/2023   COVID-19 07/10/2023   Hyperkalemia 07/10/2023   Metabolic acidosis with increased anion gap and accumulation of organic acids 07/10/2023   Hypertensive urgency 07/10/2023   Hypoglycemia 07/10/2023   Weakness 07/10/2023   Syncope 07/05/2022   Syncope and collapse 07/04/2022   Type 2 diabetes mellitus (HCC) 07/04/2022   Anemia of chronic disease 07/04/2022   Acute metabolic encephalopathy due to hypoglycemia 12/30/2021   Hypothermia 12/30/2021   Uncontrolled type 2 diabetes mellitus with hypoglycemia, with long-term current use of insulin  (HCC)  12/30/2021   History of CVA (cerebrovascular accident) 12/30/2021   DNR (do not resuscitate) 12/30/2021   Stroke determined by clinical assessment (HCC) 12/18/2021   ESRD (end stage renal disease) on dialysis (HCC) 02/23/2021   DOE (dyspnea on exertion) 11/22/2017   Hyperlipidemia LDL goal <70 04/17/2017   CAD (coronary artery disease)    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 09/10/2014   Diabetes mellitus without complication (HCC) 09/10/2014   Hypertension 09/10/2014   Emphysema lung (HCC) 09/10/2014   PCP:  Shelda Atlas, MD Pharmacy:   [facility] [address] [place] - [address] [place] [address] Phone: [phone] Fax: [phone]  [facility]  [address] [place] [address] Phone: [phone] Fax: [phone]  [facility] [address] [place] [address] Phone: [phone] Fax: [phone]     Social Drivers of Health (SDOH) Social History: SDOH Screenings   Food Insecurity: No Food Insecurity (05/09/2024)  Housing: Unknown (05/09/2024)  Transportation Needs: No Transportation Needs (05/09/2024)  Utilities: Not At Risk (05/09/2024)  Social Connections: Moderately Isolated (05/09/2024)  Tobacco Use: Medium Risk (05/21/2024)   SDOH Interventions:     Readmission Risk Interventions    07/07/2022    9:49 AM  Readmission Risk Prevention Plan  Transportation Screening Complete  PCP or Specialist Appt within 3-5 Days Complete   HRI or Home Care Consult Complete  Social Work Consult for Recovery Care Planning/Counseling --  Palliative Care Screening Not Applicable  Medication Review Oceanographer) Complete

## 2024-05-21 NOTE — Discharge Instructions (Signed)
 It was a pleasure taking care of you today. You were seen in the Emergency Department for evaluation of loss of vision. Your work-up was reassuring. Your CT/Xray/Labs showed no acute abnormality. Refer to the attached documentation for further management of your symptoms. Follow up with your primary care doctor as soon as possible for emergency department follow-up.  Please return to the ER if you experience chest pain, trouble breathing, intractable nausea/vomiting or any other life threatening illnesses.

## 2024-05-21 NOTE — Discharge Planning (Signed)
 Pt currently active with [facility] for Home Health services PT and OT as confirmed by [facility] with Jon of [facility].  Pt will resume HH services at discharge. No DME needs identified at this time.  Carson Meche J. Debarah, BSN, RN, [facility]  Inpatient Care Management  Nurse Case Manager  [facility] Emergency Departments  Operative Services  [phone].

## 2024-05-22 ENCOUNTER — Encounter (HOSPITAL_COMMUNITY): Payer: Self-pay | Admitting: Emergency Medicine

## 2024-05-22 ENCOUNTER — Other Ambulatory Visit: Payer: Self-pay

## 2024-05-22 MED ORDER — HYDRALAZINE HCL 10 MG PO TABS
10.0000 mg | ORAL_TABLET | Freq: Once | ORAL | Status: AC
  Administered 2024-05-22: 10 mg via ORAL
  Filled 2024-05-22: qty 1

## 2024-05-22 MED ORDER — MELATONIN 3 MG PO TABS: 3.0000 mg | ORAL_TABLET | Freq: Every day | ORAL | Status: DC

## 2024-05-22 MED ORDER — ACETAMINOPHEN 325 MG PO TABS
650.0000 mg | ORAL_TABLET | Freq: Once | ORAL | Status: AC
  Administered 2024-05-22: 650 mg via ORAL
  Filled 2024-05-22: qty 2

## 2024-05-22 MED ORDER — AMLODIPINE BESYLATE 5 MG PO TABS
10.0000 mg | ORAL_TABLET | Freq: Once | ORAL | Status: AC
  Administered 2024-05-22: 10 mg via ORAL
  Filled 2024-05-22: qty 2

## 2024-05-22 MED ORDER — CLONIDINE HCL 0.2 MG PO TABS
0.2000 mg | ORAL_TABLET | Freq: Once | ORAL | Status: AC
  Administered 2024-05-22: 0.2 mg via ORAL
  Filled 2024-05-22: qty 1

## 2024-05-22 NOTE — ED Notes (Signed)
 [facility] called

## 2024-05-22 NOTE — ED Provider Notes (Signed)
 " [facility] EMERGENCY DEPARTMENT AT [facility] Provider Note   CSN: [id number] Arrival date & time: 05/21/24  2354     Patient presents with: Weakness   Neeraj Housand is a 73 y.o. male.   73 year old male presents emergency room via EMS with complaint of difficulty sleeping.  Patient states that he feels like he cannot turn off his mind and go to sleep.  He states when he was here the other night he was given Tylenol  and this helped him sleep.  Requesting same.  He denies any acute complaints otherwise.       Prior to Admission medications  Medication Sig Start Date End Date Taking? Authorizing Provider  amLODipine  (NORVASC ) 10 MG tablet Take 1 tablet (10 mg total) by mouth at bedtime. 07/20/23   Rojelio Nest, DO  aspirin  81 MG chewable tablet Chew 1 tablet (81 mg total) by mouth daily. 07/20/23   Rojelio Nest, DO  atorvastatin  (LIPITOR ) 40 MG tablet Take 1 tablet (40 mg total) by mouth at bedtime. 07/20/23   Rojelio Nest, DO  cloNIDine  (CATAPRES ) 0.1 MG tablet Take 1 tablet (0.1 mg total) by mouth 2 (two) times daily. Patient not taking: Reported on 05/09/2024 03/29/24   Tobie Yetta HERO, MD  clopidogrel  (PLAVIX ) 75 MG tablet Take 75 mg by mouth daily. 01/14/24   [provider]  hydrALAZINE  (APRESOLINE ) 50 MG tablet Take 1 tablet (50 mg total) by mouth every 8 (eight) hours. 07/20/23   Rojelio Nest, DO  losartan  (COZAAR ) 100 MG tablet Take 1 tablet (100 mg total) by mouth daily. 07/20/23   Rojelio Nest, DO  nitroGLYCERIN  (NITROSTAT ) 0.4 MG SL tablet Place 0.4 mg under the tongue every 5 (five) minutes as needed for chest pain.    [provider]  Nutritional Supplements (FEEDING SUPPLEMENT, NEPRO CARB STEADY,) LIQD Take 237 mLs by mouth 3 (three) times daily between meals. 03/29/24   Tobie Yetta HERO, MD  oxyCODONE  (ROXICODONE ) 5 MG immediate release tablet Take 1 tablet (5 mg total) by mouth every 4 (four) hours as needed for severe pain (pain score  7-10). 05/09/24   Schuh, McKenzi P, PA-C  sevelamer  carbonate (RENVELA ) 800 MG tablet Take 800-1,600 mg by mouth See admin instructions. Take 2 tablets by mouth tid daily with meals and 1 tablet bid with snacks 04/02/24   [provider]  spironolactone  (ALDACTONE ) 25 MG tablet Take 25 mg by mouth daily. 08/23/23   [provider]    Allergies: Patient has no known allergies.    Review of Systems Negative except as per HPI Updated Vital Signs BP (!) 186/70 (BP Location: Right Arm)   Pulse (!) 59   Temp 97.9 F (36.6 C) (Oral)   Resp 17   Ht 5' 10 (1.778 m)   Wt 61.2 kg   SpO2 97%   BMI 19.37 kg/m   Physical Exam Vitals and nursing note reviewed.  Constitutional:      General: He is not in acute distress.    Appearance: He is well-developed. He is not diaphoretic.  HENT:     Head: Normocephalic and atraumatic.     Mouth/Throat:     Mouth: Mucous membranes are moist.  Cardiovascular:     Rate and Rhythm: Normal rate and regular rhythm.     Heart sounds: Normal heart sounds.  Pulmonary:     Effort: Pulmonary effort is normal.     Breath sounds: Normal breath sounds.  Abdominal:     Palpations: Abdomen  is soft.     Tenderness: There is no abdominal tenderness.  Musculoskeletal:     Right lower leg: Edema present.     Left lower leg: Edema present.  Skin:    General: Skin is warm and dry.  Neurological:     Mental Status: He is alert and oriented to person, place, and time.  Psychiatric:        Behavior: Behavior normal.     (all labs ordered are listed, but only abnormal results are displayed) Labs Reviewed - No data to display  EKG: None  Radiology: CT Head Wo Contrast Result Date: 05/21/2024 EXAM: CT HEAD WITHOUT CONTRAST 05/21/2024 03:49:41 AM TECHNIQUE: CT of the head was performed without the administration of intravenous contrast. Automated exposure control, iterative reconstruction, and/or weight based adjustment of the mA/kV was  utilized to reduce the radiation dose to as low as reasonably achievable. COMPARISON: Comparison with 03/22/2024. CLINICAL HISTORY: Neuro deficit, acute, stroke suspected. FINDINGS: BRAIN AND VENTRICLES: No acute hemorrhage. No evidence of acute infarct. No hydrocephalus. No extra-axial collection. No mass effect or midline shift. Chronic microvascular ischemia and generalized atrophy. ORBITS: No acute abnormality. SINUSES: No acute abnormality. SOFT TISSUES AND SKULL: No acute soft tissue abnormality. No skull fracture. IMPRESSION: 1. No acute intracranial abnormality. Electronically signed by: Norman Gatlin MD 05/21/2024 04:00 AM EST RP Workstation: [id number]     Procedures   Medications Ordered in the ED  melatonin tablet 3 mg (has no administration in time range)  acetaminophen  (TYLENOL ) tablet 650 mg (has no administration in time range)                                    Medical Decision Making  73 year old male presents with request for help with sleeping.  Patient was seen in this emergency room last night with request for placement.  He had a PT and social work evaluation and was ultimately discharged home with increase in services at home. Patient does have a primary care provider, he plans to follow-up with the primary care to discuss his sleep difficulties. He is agreeable with plan for melatonin and Tylenol  in the ER.  He will be discharged back to home via transport.     Final diagnoses:  Sleeping difficulty    ED Discharge Orders     None          Beverley Leita DELENA DEVONNA 05/22/24 0119    Pollina, Christopher J, MD 05/22/24 0425  "

## 2024-05-22 NOTE — Discharge Instructions (Signed)
 Follow up with your PCP to discuss sleep medications.

## 2024-05-24 ENCOUNTER — Other Ambulatory Visit: Payer: Self-pay

## 2024-05-24 ENCOUNTER — Emergency Department (HOSPITAL_COMMUNITY)

## 2024-05-24 ENCOUNTER — Inpatient Hospital Stay (HOSPITAL_COMMUNITY)
Admission: EM | Admit: 2024-05-24 | Discharge: 2024-06-02 | DRG: 640 | Disposition: A | Discharge status: Skilled Nursing Facility | Attending: Family Medicine | Admitting: Family Medicine

## 2024-05-24 DIAGNOSIS — E1122 Type 2 diabetes mellitus with diabetic chronic kidney disease: Secondary | ICD-10-CM | POA: Diagnosis present

## 2024-05-24 DIAGNOSIS — I2510 Atherosclerotic heart disease of native coronary artery without angina pectoris: Secondary | ICD-10-CM | POA: Diagnosis present

## 2024-05-24 DIAGNOSIS — J101 Influenza due to other identified influenza virus with other respiratory manifestations: Secondary | ICD-10-CM | POA: Diagnosis present

## 2024-05-24 DIAGNOSIS — Z79899 Other long term (current) drug therapy: Secondary | ICD-10-CM

## 2024-05-24 DIAGNOSIS — E875 Hyperkalemia: Principal | ICD-10-CM | POA: Diagnosis present

## 2024-05-24 DIAGNOSIS — R188 Other ascites: Secondary | ICD-10-CM | POA: Diagnosis present

## 2024-05-24 DIAGNOSIS — E785 Hyperlipidemia, unspecified: Secondary | ICD-10-CM | POA: Diagnosis present

## 2024-05-24 DIAGNOSIS — J449 Chronic obstructive pulmonary disease, unspecified: Secondary | ICD-10-CM | POA: Diagnosis present

## 2024-05-24 DIAGNOSIS — I160 Hypertensive urgency: Secondary | ICD-10-CM | POA: Diagnosis present

## 2024-05-24 DIAGNOSIS — Z992 Dependence on renal dialysis: Secondary | ICD-10-CM | POA: Diagnosis not present

## 2024-05-24 DIAGNOSIS — R54 Age-related physical debility: Secondary | ICD-10-CM | POA: Diagnosis present

## 2024-05-24 DIAGNOSIS — I739 Peripheral vascular disease, unspecified: Secondary | ICD-10-CM | POA: Diagnosis present

## 2024-05-24 DIAGNOSIS — N2581 Secondary hyperparathyroidism of renal origin: Secondary | ICD-10-CM | POA: Diagnosis present

## 2024-05-24 DIAGNOSIS — K810 Acute cholecystitis: Secondary | ICD-10-CM

## 2024-05-24 DIAGNOSIS — K819 Cholecystitis, unspecified: Secondary | ICD-10-CM | POA: Diagnosis present

## 2024-05-24 DIAGNOSIS — F329 Major depressive disorder, single episode, unspecified: Secondary | ICD-10-CM | POA: Diagnosis present

## 2024-05-24 DIAGNOSIS — D631 Anemia in chronic kidney disease: Secondary | ICD-10-CM | POA: Diagnosis present

## 2024-05-24 DIAGNOSIS — Z951 Presence of aortocoronary bypass graft: Secondary | ICD-10-CM

## 2024-05-24 DIAGNOSIS — Z7902 Long term (current) use of antithrombotics/antiplatelets: Secondary | ICD-10-CM

## 2024-05-24 DIAGNOSIS — Z7982 Long term (current) use of aspirin: Secondary | ICD-10-CM

## 2024-05-24 DIAGNOSIS — I252 Old myocardial infarction: Secondary | ICD-10-CM

## 2024-05-24 DIAGNOSIS — I120 Hypertensive chronic kidney disease with stage 5 chronic kidney disease or end stage renal disease: Secondary | ICD-10-CM | POA: Diagnosis present

## 2024-05-24 DIAGNOSIS — E1165 Type 2 diabetes mellitus with hyperglycemia: Secondary | ICD-10-CM | POA: Diagnosis present

## 2024-05-24 DIAGNOSIS — H5461 Unqualified visual loss, right eye, normal vision left eye: Secondary | ICD-10-CM | POA: Diagnosis present

## 2024-05-24 DIAGNOSIS — E11649 Type 2 diabetes mellitus with hypoglycemia without coma: Secondary | ICD-10-CM | POA: Diagnosis present

## 2024-05-24 DIAGNOSIS — Z91148 Patient's other noncompliance with medication regimen for other reason: Secondary | ICD-10-CM

## 2024-05-24 DIAGNOSIS — Z833 Family history of diabetes mellitus: Secondary | ICD-10-CM

## 2024-05-24 DIAGNOSIS — Z8249 Family history of ischemic heart disease and other diseases of the circulatory system: Secondary | ICD-10-CM

## 2024-05-24 DIAGNOSIS — N186 End stage renal disease: Secondary | ICD-10-CM | POA: Diagnosis present

## 2024-05-24 DIAGNOSIS — E162 Hypoglycemia, unspecified: Secondary | ICD-10-CM | POA: Diagnosis present

## 2024-05-24 DIAGNOSIS — Z8673 Personal history of transient ischemic attack (TIA), and cerebral infarction without residual deficits: Secondary | ICD-10-CM

## 2024-05-24 DIAGNOSIS — Z66 Do not resuscitate: Secondary | ICD-10-CM | POA: Diagnosis present

## 2024-05-24 DIAGNOSIS — Z87891 Personal history of nicotine dependence: Secondary | ICD-10-CM

## 2024-05-24 DIAGNOSIS — Z823 Family history of stroke: Secondary | ICD-10-CM

## 2024-05-24 LAB — CBC WITH DIFFERENTIAL/PLATELET
Abs Immature Granulocytes: 0.02 K/uL (ref 0.00–0.07)
Basophils Absolute: 0.1 K/uL (ref 0.0–0.1)
Basophils Relative: 1 %
Eosinophils Absolute: 0 K/uL (ref 0.0–0.5)
Eosinophils Relative: 0 %
HCT: 32.4 % — ABNORMAL LOW (ref 39.0–52.0)
Hemoglobin: 10.3 g/dL — ABNORMAL LOW (ref 13.0–17.0)
Immature Granulocytes: 0 %
Lymphocytes Relative: 6 %
Lymphs Abs: 0.3 K/uL — ABNORMAL LOW (ref 0.7–4.0)
MCH: 31.6 pg (ref 26.0–34.0)
MCHC: 31.8 g/dL (ref 30.0–36.0)
MCV: 99.4 fL (ref 80.0–100.0)
Monocytes Absolute: 0.7 K/uL (ref 0.1–1.0)
Monocytes Relative: 14 %
Neutro Abs: 4.2 K/uL (ref 1.7–7.7)
Neutrophils Relative %: 79 %
Platelets: 263 K/uL (ref 150–400)
RBC: 3.26 MIL/uL — ABNORMAL LOW (ref 4.22–5.81)
RDW: 16.7 % — ABNORMAL HIGH (ref 11.5–15.5)
WBC: 5.3 K/uL (ref 4.0–10.5)
nRBC: 0 % (ref 0.0–0.2)

## 2024-05-24 LAB — HEPATIC FUNCTION PANEL
ALT: 26 U/L (ref 0–44)
AST: 62 U/L — ABNORMAL HIGH (ref 15–41)
Albumin: 4 g/dL (ref 3.5–5.0)
Alkaline Phosphatase: 94 U/L (ref 38–126)
Bilirubin, Direct: 0.1 mg/dL (ref 0.0–0.2)
Indirect Bilirubin: 0.4 mg/dL (ref 0.3–0.9)
Total Bilirubin: 0.5 mg/dL (ref 0.0–1.2)
Total Protein: 7.4 g/dL (ref 6.5–8.1)

## 2024-05-24 LAB — BASIC METABOLIC PANEL WITH GFR
Anion gap: 18 — ABNORMAL HIGH (ref 5–15)
BUN: 76 mg/dL — ABNORMAL HIGH (ref 8–23)
CO2: 21 mmol/L — ABNORMAL LOW (ref 22–32)
Calcium: 8.9 mg/dL (ref 8.9–10.3)
Chloride: 93 mmol/L — ABNORMAL LOW (ref 98–111)
Creatinine, Ser: 10.8 mg/dL — ABNORMAL HIGH (ref 0.61–1.24)
GFR, Estimated: 5 mL/min — ABNORMAL LOW
Glucose, Bld: 66 mg/dL — ABNORMAL LOW (ref 70–99)
Potassium: 6.3 mmol/L (ref 3.5–5.1)
Sodium: 133 mmol/L — ABNORMAL LOW (ref 135–145)

## 2024-05-24 LAB — I-STAT CHEM 8, ED
BUN: 85 mg/dL — ABNORMAL HIGH (ref 8–23)
Calcium, Ion: 0.95 mmol/L — ABNORMAL LOW (ref 1.15–1.40)
Chloride: 99 mmol/L (ref 98–111)
Creatinine, Ser: 12.1 mg/dL — ABNORMAL HIGH (ref 0.61–1.24)
Glucose, Bld: 63 mg/dL — ABNORMAL LOW (ref 70–99)
HCT: 34 % — ABNORMAL LOW (ref 39.0–52.0)
Hemoglobin: 11.6 g/dL — ABNORMAL LOW (ref 13.0–17.0)
Potassium: 6 mmol/L — ABNORMAL HIGH (ref 3.5–5.1)
Sodium: 131 mmol/L — ABNORMAL LOW (ref 135–145)
TCO2: 23 mmol/L (ref 22–32)

## 2024-05-24 LAB — CBG MONITORING, ED: Glucose-Capillary: 107 mg/dL — ABNORMAL HIGH (ref 70–99)

## 2024-05-24 LAB — LIPASE, BLOOD: Lipase: 12 U/L (ref 11–51)

## 2024-05-24 MED ORDER — FENTANYL CITRATE (PF) 50 MCG/ML IJ SOSY
50.0000 ug | PREFILLED_SYRINGE | Freq: Once | INTRAMUSCULAR | Status: AC
  Administered 2024-05-24: 50 ug via INTRAVENOUS
  Filled 2024-05-24: qty 1

## 2024-05-24 MED ORDER — CALCIUM GLUCONATE-NACL 1-0.675 GM/50ML-% IV SOLN
1.0000 g | Freq: Once | INTRAVENOUS | Status: AC
  Administered 2024-05-25: 1000 mg via INTRAVENOUS
  Filled 2024-05-24 (×2): qty 50

## 2024-05-24 MED ORDER — ACETAMINOPHEN 325 MG PO TABS
650.0000 mg | ORAL_TABLET | Freq: Once | ORAL | Status: AC
  Administered 2024-05-24: 650 mg via ORAL
  Filled 2024-05-24: qty 2

## 2024-05-24 MED ORDER — SODIUM CHLORIDE 0.9 % IV SOLN
1.0000 g | Freq: Once | INTRAVENOUS | Status: AC
  Administered 2024-05-25: 1 g via INTRAVENOUS
  Filled 2024-05-24 (×2): qty 10

## 2024-05-24 MED ORDER — METRONIDAZOLE 500 MG/100ML IV SOLN
500.0000 mg | Freq: Once | INTRAVENOUS | Status: AC
  Administered 2024-05-25: 500 mg via INTRAVENOUS
  Filled 2024-05-24: qty 100

## 2024-05-24 MED ORDER — ALBUTEROL SULFATE (2.5 MG/3ML) 0.083% IN NEBU
10.0000 mg | INHALATION_SOLUTION | Freq: Once | RESPIRATORY_TRACT | Status: AC
  Administered 2024-05-24: 10 mg via RESPIRATORY_TRACT
  Filled 2024-05-24: qty 12

## 2024-05-24 MED ORDER — INSULIN ASPART 100 UNIT/ML IV SOLN
10.0000 [IU] | Freq: Once | INTRAVENOUS | Status: DC
  Filled 2024-05-24: qty 10

## 2024-05-24 MED ORDER — SODIUM ZIRCONIUM CYCLOSILICATE 10 G PO PACK
10.0000 g | PACK | Freq: Once | ORAL | Status: AC
  Administered 2024-05-25: 10 g via ORAL
  Filled 2024-05-24: qty 1

## 2024-05-24 MED ORDER — DEXTROSE 50 % IV SOLN
50.0000 mL | Freq: Once | INTRAVENOUS | Status: AC
  Administered 2024-05-25: 50 mL via INTRAVENOUS
  Filled 2024-05-24: qty 50

## 2024-05-24 MED ORDER — CLONIDINE HCL 0.1 MG PO TABS
0.1000 mg | ORAL_TABLET | Freq: Once | ORAL | Status: AC
  Administered 2024-05-24: 0.1 mg via ORAL
  Filled 2024-05-24: qty 1

## 2024-05-24 MED ORDER — CHLORHEXIDINE GLUCONATE CLOTH 2 % EX PADS
6.0000 | MEDICATED_PAD | Freq: Every day | CUTANEOUS | Status: DC
  Administered 2024-05-26: 6 via TOPICAL

## 2024-05-24 NOTE — Plan of Care (Incomplete)
 73 year old male with history of ESRD on HD MWF, CAD status post CABG in 2018, COPD, CVA, type 2 diabetes, PAD, hypertension, hyperlipidemia, medication noncompliance, vision impairment/blindness.  Patient recently had clotted left forearm graft and was admitted by vascular surgery service and taken to the OR on 12/11 for open thrombectomy of the left forearm AV loop graft and revision of left forearm loop graft with interposition grafting from the venous limb to the basal leg vein.  Patient presents to the ED today via EMS for evaluation of generalized weakness and hypoglycemia.  Blood glucose was 53 with EMS and was given D10 with improvement of glucose to 117.  Patient AAO x 4.  Vital signs on arrival to the ED: Temperature 97.5 F, pulse 65, respiratory rate 16, blood pressure 238/89, and SpO2 95-96% on room air.  Labs notable for hemoglobin 10.3 (stable), sodium 133, chloride 93, bicarb 21, anion gap 18, glucose 66, AST 62 but remainder of LFTs normal, lipase normal, UA pending.  Potassium 6.3 on initial BMP but there was concern for possible hemolysis so i-STAT chemistry repeated which confirmed hyperkalemia with potassium of 6.0.  Chest x-ray showing heart size at upper limits of normal and no acute findings.  CT abdomen pelvis showing: IMPRESSION: 1. Poorly visualized, suggestion of gallbladder wall thickening and pericholecystic fluid. Recommend correlation with liver function tests. If clinically indicated, further evaluation with right upper quadrant ultrasound for acute cholecystitis. 2. Trace shallow left inguinal hernia containing fat, ascites, and the antimesenteric wall of the sigmoid colon. No findings to suggest incarceration or obstruction of the bowel. 3. Trace right pleural effusion. 4. 3 cm simple free fluid consistent with ascites. 5. Cluster of left lower lobe peribronchovascular pulmonary nodules measuring up to 7 x 6 mm. Findings suggestive of infectious/inflammation.  Follow-up noncontrast chest CT in 6-12 months recommended per Fleischner Society Guidelines (and if high-risk, repeat noncontrast chest CT at 18-24 months). 6. Severe atherosclerotic plaque. 7. Limited evaluation of this noncontrast study.  Abdominal right upper quadrant ultrasound showing acute acalculous cholecystitis and trace simple free fluid ascites   Patient was given Tylenol , albuterol , clonidine , fentanyl , insulin /dextrose , Lokelma , calcium  gluconate, ceftriaxone , and metronidazole .  Nephrology Dr. Dolan consulted and planning on dialysis in the morning.  General surgery Dr. Paola recommended antibiotics and will consult.  EKG: Normal sinus rhythm and no acute ischemic changes.   There was concern for patient not being able to take good care of himself at home and not taking medications consistently.   Last echo done in February 2024 showing EF 70 to 75%, mild LVH, trivial MVR.

## 2024-05-24 NOTE — ED Notes (Signed)
 Pt keeps saying  lord let me pass away, I can't take this anymore

## 2024-05-24 NOTE — Progress Notes (Signed)
 [facility] note: Received a call from ER to manage this ESRD patient. BP and K elevated. Receiving medical management of hyperkalemia including ca, dextrose , insulin , lokelma  etc. Will plan to do HD in am. Resume home meds. Full consult note to follow. D/w EDP  Geraldin Habermehl Dolan, CKA

## 2024-05-24 NOTE — ED Triage Notes (Signed)
 Pt BIBA for weakness and hypoglycemia. Pt missed HD (MWF) today due to his weakness. EMS found him hypoglycemic in the 53 and his BS went up to 117 after D10. Per EMS pt Aox4, 12 Lead - SR. Pt here for dialysis.

## 2024-05-24 NOTE — ED Provider Notes (Signed)
 "  EMERGENCY DEPARTMENT AT [facility] ([facility]) Provider Note   CSN: [id number] Arrival date & time: 05/24/24  1511     Patient presents with: Hypoglycemia   Alfred Harrel is a 73 y.o. male.   Patient is a 73 year old male who presents with weakness.  He has a history of end-stage renal disease on dialysis Monday Wednesday Friday, hypertension, hyperlipidemia, coronary artery disease, prior CVA.  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  This is not a new complaint.  He does state he is having hard time taking care of himself at home.  He has been seen in the ED frequently for various complaints.  He was seen in both December 22 and December 23.  He was admitted earlier this month overnight for thrombectomy of his left forearm graft.  He says he did not go to dialysis today because he did not know what day it was.  He says his blindness is making it hard for him to do things at home.  He has a hard time finding his medications.  He did not take his medications today.  He did go to dialysis on Wednesday but did not go today.  He denies any chest pain or shortness of breath.  He does have a little bit of coughing while I am in the room.  He denies any fevers.  He is holding his abdomen but he is denying any specific abdominal pain.  He says it just catches him and he has to balled up.  He was noted to be hypoglycemic by EMS with a blood sugar of 53.  He was given D10 and route and improved up to 117.       Prior to Admission medications  Medication Sig Start Date End Date Taking? Authorizing Provider  amLODipine  (NORVASC ) 10 MG tablet Take 1 tablet (10 mg total) by mouth at bedtime. 07/20/23   Rojelio Nest, DO  aspirin  81 MG chewable tablet Chew 1 tablet (81 mg total) by mouth daily. 07/20/23   Rojelio Nest, DO  atorvastatin  (LIPITOR ) 40 MG tablet Take 1 tablet (40 mg total) by mouth at bedtime. 07/20/23   Rojelio Nest, DO  cloNIDine  (CATAPRES )  0.1 MG tablet Take 1 tablet (0.1 mg total) by mouth 2 (two) times daily. Patient not taking: Reported on 05/09/2024 03/29/24   Tobie Yetta HERO, MD  clopidogrel  (PLAVIX ) 75 MG tablet Take 75 mg by mouth daily. 01/14/24   [provider]  hydrALAZINE  (APRESOLINE ) 50 MG tablet Take 1 tablet (50 mg total) by mouth every 8 (eight) hours. 07/20/23   Rojelio Nest, DO  losartan  (COZAAR ) 100 MG tablet Take 1 tablet (100 mg total) by mouth daily. 07/20/23   Rojelio Nest, DO  nitroGLYCERIN  (NITROSTAT ) 0.4 MG SL tablet Place 0.4 mg under the tongue every 5 (five) minutes as needed for chest pain.    [provider]  Nutritional Supplements (FEEDING SUPPLEMENT, NEPRO CARB STEADY,) LIQD Take 237 mLs by mouth 3 (three) times daily between meals. 03/29/24   Tobie Yetta HERO, MD  oxyCODONE  (ROXICODONE ) 5 MG immediate release tablet Take 1 tablet (5 mg total) by mouth every 4 (four) hours as needed for severe pain (pain score 7-10). 05/09/24   Schuh, McKenzi P, PA-C  sevelamer  carbonate (RENVELA ) 800 MG tablet Take 800-1,600 mg by mouth See admin instructions. Take 2 tablets by mouth tid daily with meals and 1 tablet bid with snacks 04/02/24  [provider]  spironolactone  (ALDACTONE ) 25 MG tablet Take 25 mg by mouth daily. 08/23/23   [provider]    Allergies: Patient has no known allergies.    Review of Systems  Constitutional:  Positive for fatigue. Negative for chills, diaphoresis and fever.  HENT:  Negative for congestion, rhinorrhea and sneezing.   Eyes: Negative.   Respiratory:  Positive for cough. Negative for chest tightness and shortness of breath.   Cardiovascular:  Negative for chest pain and leg swelling.  Gastrointestinal:  Positive for abdominal pain. Negative for diarrhea, nausea and vomiting.  Genitourinary:  Negative for difficulty urinating, flank pain, frequency and hematuria.  Musculoskeletal:  Negative for arthralgias and back pain.  Skin:  Negative  for rash.  Neurological:  Positive for weakness (Generalized). Negative for dizziness, speech difficulty, numbness and headaches.    Updated Vital Signs BP (!) 182/72   Pulse (!) 52   Temp 98 F (36.7 C) (Oral)   Resp (!) 22   Ht 5' 10 (1.778 m)   Wt 61.2 kg   SpO2 99%   BMI 19.36 kg/m   Physical Exam Constitutional:      Appearance: He is well-developed.  HENT:     Head: Normocephalic and atraumatic.  Eyes:     Pupils: Pupils are equal, round, and reactive to light.  Cardiovascular:     Rate and Rhythm: Normal rate and regular rhythm.     Heart sounds: Normal heart sounds.  Pulmonary:     Effort: Pulmonary effort is normal. No respiratory distress.     Breath sounds: Normal breath sounds. No wheezing or rales.  Chest:     Chest wall: No tenderness.  Abdominal:     General: Bowel sounds are normal.     Palpations: Abdomen is soft.     Tenderness: There is no abdominal tenderness. There is no guarding or rebound.  Musculoskeletal:        General: Normal range of motion.     Cervical back: Normal range of motion and neck supple.  Lymphadenopathy:     Cervical: No cervical adenopathy.  Skin:    General: Skin is warm and dry.     Findings: No rash.  Neurological:     General: No focal deficit present.     Mental Status: He is alert.     Comments: Oriented to person and place.  He told me the year was 2026 after much thought.  Took him a while to answer that question.  He did tell me that it was December.  I do not appreciate any focal deficits.     (all labs ordered are listed, but only abnormal results are displayed) Labs Reviewed  CBC WITH DIFFERENTIAL/PLATELET - Abnormal; Notable for the following components:      Result Value   RBC 3.26 (*)    Hemoglobin 10.3 (*)    HCT 32.4 (*)    RDW 16.7 (*)    Lymphs Abs 0.3 (*)    All other components within normal limits  BASIC METABOLIC PANEL WITH GFR - Abnormal; Notable for the following components:   Sodium 133  (*)    Potassium 6.3 (*)    Chloride 93 (*)    CO2 21 (*)    Glucose, Bld 66 (*)    BUN 76 (*)    Creatinine, Ser 10.80 (*)    GFR, Estimated 5 (*)    Anion gap 18 (*)    All other components within normal  limits  HEPATIC FUNCTION PANEL - Abnormal; Notable for the following components:   AST 62 (*)    All other components within normal limits  CBG MONITORING, ED - Abnormal; Notable for the following components:   Glucose-Capillary 107 (*)    All other components within normal limits  I-STAT CHEM 8, ED - Abnormal; Notable for the following components:   Sodium 131 (*)    Potassium 6.0 (*)    BUN 85 (*)    Creatinine, Ser 12.10 (*)    Glucose, Bld 63 (*)    Calcium , Ion 0.95 (*)    Hemoglobin 11.6 (*)    HCT 34.0 (*)    All other components within normal limits  LIPASE, BLOOD  URINALYSIS, W/ REFLEX TO CULTURE (INFECTION SUSPECTED)  HEPATITIS B SURFACE ANTIGEN  HEPATITIS B SURFACE ANTIBODY, QUANTITATIVE    EKG: EKG Interpretation Date/Time:  Friday May 24 2024 16:04:12 EST Ventricular Rate:  68 PR Interval:  174 QRS Duration:  90 QT Interval:  435 QTC Calculation: 463 R Axis:   66  Text Interpretation: Sinus rhythm Abnormal R-wave progression, late transition Nonspecific T abnrm, anterolateral leads since last tracing no significant change Confirmed by Lenor Hollering [phone]) on 05/24/2024 4:17:06 PM  Radiology: US  Abdomen Limited RUQ (LIVER/GB) Result Date: 05/24/2024 EXAM: Right Upper Quadrant Abdominal Ultrasound 05/24/2024 08:54:06 PM TECHNIQUE: Real-time ultrasonography of the right upper quadrant of the abdomen was performed. COMPARISON: CT abdomen and pelvis 05/24/2024. CLINICAL HISTORY: RUQ pain. FINDINGS: LIVER: Normal echogenicity. No intrahepatic biliary ductal dilatation. No evidence of mass. Hepatopetal flow in the portal vein. BILIARY SYSTEM: Gallbladder wall thickening and pericholecystic fluid. No definite evidence of calcified gallstones. Common bile  duct is within normal limits measuring  mm. RIGHT KIDNEY: No hydronephrosis. No echogenic calculi. No mass. PANCREAS: Visualized portions of the pancreas are unremarkable. OTHER: Trace a simple free fluid ascites. IMPRESSION: 1. Acute acalculous cholecystitis. 2. Trace simple free fluid ascites. Electronically signed by: Morgane Naveau MD 05/24/2024 10:09 PM EST RP Workstation: [id number]   CT ABDOMEN PELVIS WO CONTRAST Result Date: 05/24/2024 EXAM: CT ABDOMEN AND PELVIS WITHOUT CONTRAST 05/24/2024 05:26:00 PM TECHNIQUE: CT of the abdomen and pelvis was performed without the administration of intravenous contrast. Multiplanar reformatted images are provided for review. Automated exposure control, iterative reconstruction, and/or weight-based adjustment of the mA/kV was utilized to reduce the radiation dose to as low as reasonably achievable. COMPARISON: None available. CLINICAL HISTORY: Abdominal pain, acute, nonlocalized. FINDINGS: LOWER CHEST: Trace right pleural effusion. Cluster of peribronchovascular pulmonary nodules within the left medial lower lobe with nodules measuring up to 7 x 6 mm. LIVER: The liver is unremarkable. GALLBLADDER AND BILE DUCTS: Gallbladder wall thickening and pericholecystic fluid. No biliary ductal dilatation. SPLEEN: No acute abnormality. PANCREAS: No acute abnormality. ADRENAL GLANDS: No acute abnormality. KIDNEYS, URETERS AND BLADDER: Bilateral kidneys are atrophic. Calcifications associated with the kidneys are likely vascular. No stones in the kidneys or ureters. No hydronephrosis. No perinephric or periureteral stranding. Urinary bladder is unremarkable. GI AND BOWEL: Stomach demonstrates no acute abnormality. No small or large bowel thickening or dilatation. The appendix is not definitely identified. PERITONEUM AND RETROPERITONEUM: Trace volume abdominal and pelvic simple free fluid ascites. No free air. VASCULATURE: Aorta is normal in caliber. Severe atherosclerotic plaque.  LYMPH NODES: No lymphadenopathy. REPRODUCTIVE ORGANS: At least small volume right hydrocele. BONES AND SOFT TISSUES: Small fat-containing umbilical hernia. Trace shallow left inguinal hernia containing fat, free fluid ascites, and the antimesenteric wall of the sigmoid colon (3:66).  No acute osseous abnormality. No focal soft tissue abnormality. IMPRESSION: 1. Poorly visualized, suggestion of gallbladder wall thickening and pericholecystic fluid. Recommend correlation with liver function tests. If clinically indicated, further evaluation with right upper quadrant ultrasound for acute cholecystitis. 2. Trace shallow left inguinal hernia containing fat, ascites, and the antimesenteric wall of the sigmoid colon. No findings to suggest incarceration or obstruction of the bowel. 3. Trace right pleural effusion. 4. 3 cm simple free fluid consistent with ascites. 5. Cluster of left lower lobe peribronchovascular pulmonary nodules measuring up to 7 x 6 mm. Findings suggestive of infectious/inflammation. Follow-up noncontrast chest CT in 6-12 months recommended per Fleischner Society Guidelines (and if high-risk, repeat noncontrast chest CT at 18-24 months). 6. Severe atherosclerotic plaque. 7. Limited evaluation of this noncontrast study. Electronically signed by: Morgane Naveau MD 05/24/2024 07:06 PM EST RP Workstation: [id number]   DG Chest 2 View Result Date: 05/24/2024 EXAM: 2 VIEW(S) XRAY OF THE CHEST 05/24/2024 04:51:00 PM COMPARISON: 03/22/2024 CLINICAL HISTORY: cough FINDINGS: LUNGS AND PLEURA: No focal pulmonary opacity. No pleural effusion. No pneumothorax. HEART AND MEDIASTINUM: Heart size at upper limits of normal. CABG markers noted. Sternotomy wires noted. BONES AND SOFT TISSUES: No acute osseous abnormality. IMPRESSION: 1. No acute findings. 2. Heart size at upper limits of normal. Electronically signed by: Greig Pique MD 05/24/2024 05:52 PM EST RP Workstation: [id number]     Procedures    Medications Ordered in the ED  sodium zirconium cyclosilicate  (LOKELMA ) packet 10 g (has no administration in time range)  calcium  gluconate 1 g/ 50 mL sodium chloride  IVPB (has no administration in time range)  insulin  aspart (novoLOG ) injection 10 Units (has no administration in time range)  dextrose  50 % solution 50 mL (has no administration in time range)  Chlorhexidine  Gluconate Cloth 2 % PADS 6 each (has no administration in time range)  cefTRIAXone  (ROCEPHIN ) 1 g in sodium chloride  0.9 % 100 mL IVPB (has no administration in time range)  metroNIDAZOLE  (FLAGYL ) IVPB 500 mg (has no administration in time range)  cloNIDine  (CATAPRES ) tablet 0.1 mg (0.1 mg Oral Given 05/24/24 1655)  acetaminophen  (TYLENOL ) tablet 650 mg (650 mg Oral Given 05/24/24 2110)  fentaNYL  (SUBLIMAZE ) injection 50 mcg (50 mcg Intravenous Given 05/24/24 2148)  albuterol  (PROVENTIL ) (2.5 MG/3ML) 0.083% nebulizer solution 10 mg (10 mg Nebulization Given 05/24/24 2254)                                    Medical Decision Making Amount and/or Complexity of Data Reviewed Labs: ordered. Radiology: ordered.  Risk OTC drugs. Prescription drug management. Decision regarding hospitalization.   This patient presents to the ED for concern of abdominal pain, weakness, this involves an extensive number of treatment options, and is a complaint that carries with it a high risk of complications and morbidity.  I considered the following differential and admission for this acute, potentially life threatening condition.  The differential diagnosis includes cholecystitis, pancreatitis, bowel obstruction, appendicitis, UTI, electrolyte abnormality, anemia  MDM:    Patient is 73 year old who does not seem to be taking very good care of himself at home and take his medications consistently.  He was hypertensive on arrival and was given a dose of his home clonidine .  He was having some vague pain in his abdomen.  Given this, CT  scan was performed which showed questionable gallbladder abnormality.  Follow-up ultrasound showed evidence of acalculous cholecystitis.  There  was a large several hour delay in getting his blood work obtained.  Once this resulted, he was noted to be hyperkalemic.  He was started on Lokelma  as well as albuterol  and insulin /D50.  Discussed with Dr. Dolan with nephrology who will arrange for patient to have dialysis in the morning.  Chest x-ray does not show any evidence of pulmonary edema or pneumonia.  Discussed with Dr. Paola regarding his acalculous cholecystitis.  Will start patient on antibiotics.  They will consult on the patient.  Discussed with Dr. Alfornia who will admit the patient.  CRITICAL CARE Performed by: Andrea Ness Total critical care time: 80 minutes Critical care time was exclusive of separately billable procedures and treating other patients. Critical care was necessary to treat or prevent imminent or life-threatening deterioration. Critical care was time spent personally by me on the following activities: development of treatment plan with patient and/or surrogate as well as nursing, discussions with consultants, evaluation of patient's response to treatment, examination of patient, obtaining history from patient or surrogate, ordering and performing treatments and interventions, ordering and review of laboratory studies, ordering and review of radiographic studies, pulse oximetry and re-evaluation of patient's condition.   (Labs, imaging, consults)  Labs: I Ordered, and personally interpreted labs.  The pertinent results include: Normal WBC count, minimally elevated ALT, normal lipase, elevated potassium, elevated creatinine  Imaging Studies ordered: I ordered imaging studies including CT abdomen pelvis, chest x-ray, ultrasound right upper quadrant I independently visualized and interpreted imaging. I agree with the radiologist interpretation  Additional history  obtained from  .  External records from outside source obtained and reviewed including prior notes  Cardiac Monitoring: The patient was maintained on a cardiac monitor.  If on the cardiac monitor, I personally viewed and interpreted the cardiac monitored which showed an underlying rhythm of:    Reevaluation: After the interventions noted above, I reevaluated the patient and found that they have :improved  Social Determinants of Health:    Disposition: Admit to hospital  Co morbidities that complicate the patient evaluation  Past Medical History:  Diagnosis Date   CAD (coronary artery disease), native coronary artery    s/p NSTEMI with cath showing severe 3VD s/p  CABG x 3 with LIMA to LAD, SVG to OM1/OM2 and SVG D1 by Dr. Army on 10/26/16.   COPD (chronic obstructive pulmonary disease) (HCC) 09/10/2014   CVA (cerebral vascular accident) (HCC) 12/18/2021   Diabetes mellitus without complication (HCC)    Type II   ESRD (end stage renal disease) (HCC)    Hyperlipidemia LDL goal <70 04/17/2017   Hypertension      Medicines Meds ordered this encounter  Medications   cloNIDine  (CATAPRES ) tablet 0.1 mg   acetaminophen  (TYLENOL ) tablet 650 mg   fentaNYL  (SUBLIMAZE ) injection 50 mcg   sodium zirconium cyclosilicate  (LOKELMA ) packet 10 g   calcium  gluconate 1 g/ 50 mL sodium chloride  IVPB   albuterol  (PROVENTIL ) (2.5 MG/3ML) 0.083% nebulizer solution 10 mg   insulin  aspart (novoLOG ) injection 10 Units   dextrose  50 % solution 50 mL   Chlorhexidine  Gluconate Cloth 2 % PADS 6 each   cefTRIAXone  (ROCEPHIN ) 1 g in sodium chloride  0.9 % 100 mL IVPB    Antibiotic Indication::   Intra-abdominal   metroNIDAZOLE  (FLAGYL ) IVPB 500 mg    Antibiotic Indication::   Intra-abdominal Infection    I have reviewed the patients home medicines and have made adjustments as needed  Problem List / ED Course: Problem List Items Addressed  This Visit       Other   Hyperkalemia - Primary   Other  Visit Diagnoses       Acalculous cholecystitis                    Final diagnoses:  Hyperkalemia  Acalculous cholecystitis    ED Discharge Orders     None          Lenor Hollering, MD 05/24/24 2332  "

## 2024-05-24 NOTE — ED Notes (Signed)
 Patient transported to Ultrasound

## 2024-05-25 ENCOUNTER — Inpatient Hospital Stay (HOSPITAL_COMMUNITY)

## 2024-05-25 DIAGNOSIS — E875 Hyperkalemia: Secondary | ICD-10-CM | POA: Diagnosis not present

## 2024-05-25 DIAGNOSIS — K810 Acute cholecystitis: Secondary | ICD-10-CM

## 2024-05-25 LAB — CBC
HCT: 29.8 % — ABNORMAL LOW (ref 39.0–52.0)
Hemoglobin: 9.4 g/dL — ABNORMAL LOW (ref 13.0–17.0)
MCH: 31.5 pg (ref 26.0–34.0)
MCHC: 31.5 g/dL (ref 30.0–36.0)
MCV: 100 fL (ref 80.0–100.0)
Platelets: 254 K/uL (ref 150–400)
RBC: 2.98 MIL/uL — ABNORMAL LOW (ref 4.22–5.81)
RDW: 16.6 % — ABNORMAL HIGH (ref 11.5–15.5)
WBC: 6.1 K/uL (ref 4.0–10.5)
nRBC: 0 % (ref 0.0–0.2)

## 2024-05-25 LAB — COMPREHENSIVE METABOLIC PANEL WITH GFR
ALT: 24 U/L (ref 0–44)
AST: 67 U/L — ABNORMAL HIGH (ref 15–41)
Albumin: 3.8 g/dL (ref 3.5–5.0)
Alkaline Phosphatase: 83 U/L (ref 38–126)
Anion gap: 22 — ABNORMAL HIGH (ref 5–15)
BUN: 77 mg/dL — ABNORMAL HIGH (ref 8–23)
CO2: 17 mmol/L — ABNORMAL LOW (ref 22–32)
Calcium: 8.8 mg/dL — ABNORMAL LOW (ref 8.9–10.3)
Chloride: 92 mmol/L — ABNORMAL LOW (ref 98–111)
Creatinine, Ser: 11 mg/dL — ABNORMAL HIGH (ref 0.61–1.24)
GFR, Estimated: 4 mL/min — ABNORMAL LOW
Glucose, Bld: 107 mg/dL — ABNORMAL HIGH (ref 70–99)
Potassium: 5.5 mmol/L — ABNORMAL HIGH (ref 3.5–5.1)
Sodium: 131 mmol/L — ABNORMAL LOW (ref 135–145)
Total Bilirubin: 0.4 mg/dL (ref 0.0–1.2)
Total Protein: 7 g/dL (ref 6.5–8.1)

## 2024-05-25 LAB — URINALYSIS, W/ REFLEX TO CULTURE (INFECTION SUSPECTED)
Bacteria, UA: NONE SEEN
Bilirubin Urine: NEGATIVE
Glucose, UA: 50 mg/dL — AB
Hgb urine dipstick: NEGATIVE
Ketones, ur: NEGATIVE mg/dL
Leukocytes,Ua: NEGATIVE
Nitrite: NEGATIVE
Protein, ur: 100 mg/dL — AB
Specific Gravity, Urine: 1.011 (ref 1.005–1.030)
pH: 7 (ref 5.0–8.0)

## 2024-05-25 LAB — CBG MONITORING, ED
Glucose-Capillary: 103 mg/dL — ABNORMAL HIGH (ref 70–99)
Glucose-Capillary: 132 mg/dL — ABNORMAL HIGH (ref 70–99)
Glucose-Capillary: 57 mg/dL — ABNORMAL LOW (ref 70–99)

## 2024-05-25 LAB — GLUCOSE, CAPILLARY
Glucose-Capillary: 71 mg/dL (ref 70–99)
Glucose-Capillary: 69 mg/dL — ABNORMAL LOW (ref 70–99)

## 2024-05-25 LAB — HEPATITIS B SURFACE ANTIGEN: Hepatitis B Surface Ag: NONREACTIVE

## 2024-05-25 MED ORDER — OXYCODONE HCL 5 MG PO TABS
5.0000 mg | ORAL_TABLET | Freq: Once | ORAL | Status: AC | PRN
  Administered 2024-05-25: 5 mg via ORAL
  Filled 2024-05-25: qty 1

## 2024-05-25 MED ORDER — CLONIDINE HCL 0.1 MG PO TABS
0.1000 mg | ORAL_TABLET | Freq: Two times a day (BID) | ORAL | Status: DC
  Administered 2024-05-25: 0.1 mg via ORAL
  Filled 2024-05-25: qty 1

## 2024-05-25 MED ORDER — PENTAFLUOROPROP-TETRAFLUOROETH EX AERO
INHALATION_SPRAY | CUTANEOUS | Status: AC
  Filled 2024-05-25: qty 30

## 2024-05-25 MED ORDER — METRONIDAZOLE 500 MG/100ML IV SOLN
500.0000 mg | Freq: Two times a day (BID) | INTRAVENOUS | Status: DC
  Administered 2024-05-25 – 2024-05-29 (×8): 500 mg via INTRAVENOUS
  Filled 2024-05-25 (×6): qty 100

## 2024-05-25 MED ORDER — LOSARTAN POTASSIUM 50 MG PO TABS
100.0000 mg | ORAL_TABLET | Freq: Every day | ORAL | Status: DC
  Administered 2024-05-25 – 2024-05-28 (×4): 100 mg via ORAL
  Filled 2024-05-25 (×3): qty 2

## 2024-05-25 MED ORDER — ATORVASTATIN CALCIUM 40 MG PO TABS
40.0000 mg | ORAL_TABLET | Freq: Every day | ORAL | Status: AC
  Administered 2024-05-25 – 2024-06-01 (×7): 40 mg via ORAL
  Filled 2024-05-25 (×4): qty 1

## 2024-05-25 MED ORDER — CLOPIDOGREL BISULFATE 75 MG PO TABS
75.0000 mg | ORAL_TABLET | Freq: Every day | ORAL | Status: DC
  Administered 2024-05-25 – 2024-06-02 (×8): 75 mg via ORAL
  Filled 2024-05-25 (×3): qty 1

## 2024-05-25 MED ORDER — DEXTROSE 50 % IV SOLN
12.5000 g | INTRAVENOUS | Status: AC | PRN
  Administered 2024-06-01: 12.5 g via INTRAVENOUS

## 2024-05-25 MED ORDER — HEPARIN SODIUM (PORCINE) 1000 UNIT/ML DIALYSIS: 20.0000 [IU]/kg | INTRAMUSCULAR | Status: DC | PRN

## 2024-05-25 MED ORDER — NALOXONE HCL 0.4 MG/ML IJ SOLN: 0.4000 mg | INTRAMUSCULAR | Status: DC | PRN

## 2024-05-25 MED ORDER — ACETAMINOPHEN 650 MG RE SUPP: 650.0000 mg | Freq: Four times a day (QID) | RECTAL | Status: DC | PRN

## 2024-05-25 MED ORDER — FENTANYL CITRATE (PF) 50 MCG/ML IJ SOSY
50.0000 ug | PREFILLED_SYRINGE | Freq: Once | INTRAMUSCULAR | Status: AC
  Administered 2024-05-25: 50 ug via INTRAVENOUS

## 2024-05-25 MED ORDER — HEPARIN SODIUM (PORCINE) 1000 UNIT/ML DIALYSIS: 1000.0000 [IU] | INTRAMUSCULAR | Status: DC | PRN

## 2024-05-25 MED ORDER — PENTAFLUOROPROP-TETRAFLUOROETH EX AERO: 1.0000 | INHALATION_SPRAY | CUTANEOUS | Status: DC | PRN

## 2024-05-25 MED ORDER — SEVELAMER CARBONATE 800 MG PO TABS
800.0000 mg | ORAL_TABLET | ORAL | Status: AC | PRN
  Administered 2024-05-25: 800 mg via ORAL
  Filled 2024-05-25: qty 1

## 2024-05-25 MED ORDER — SODIUM CHLORIDE 0.9 % IV SOLN
2.0000 g | Freq: Every day | INTRAVENOUS | Status: DC
  Administered 2024-05-25 – 2024-05-28 (×4): 2 g via INTRAVENOUS
  Filled 2024-05-25 (×3): qty 20

## 2024-05-25 MED ORDER — HYDRALAZINE HCL 20 MG/ML IJ SOLN
5.0000 mg | Freq: Four times a day (QID) | INTRAMUSCULAR | Status: DC | PRN
  Administered 2024-05-25: 5 mg via INTRAVENOUS
  Filled 2024-05-25: qty 1

## 2024-05-25 MED ORDER — FENTANYL CITRATE (PF) 50 MCG/ML IJ SOSY
PREFILLED_SYRINGE | INTRAMUSCULAR | Status: AC
  Filled 2024-05-25: qty 1

## 2024-05-25 MED ORDER — ALTEPLASE 2 MG IJ SOLR: 2.0000 mg | Freq: Once | INTRAMUSCULAR | Status: DC | PRN

## 2024-05-25 MED ORDER — AMLODIPINE BESYLATE 10 MG PO TABS
10.0000 mg | ORAL_TABLET | Freq: Every day | ORAL | Status: AC
  Administered 2024-05-25 – 2024-06-01 (×7): 10 mg via ORAL
  Filled 2024-05-25 (×2): qty 1
  Filled 2024-05-25: qty 2
  Filled 2024-05-25: qty 1

## 2024-05-25 MED ORDER — HYDRALAZINE HCL 50 MG PO TABS
50.0000 mg | ORAL_TABLET | Freq: Three times a day (TID) | ORAL | Status: DC
  Administered 2024-05-25 – 2024-05-27 (×7): 50 mg via ORAL
  Filled 2024-05-25 (×5): qty 1
  Filled 2024-05-25: qty 2
  Filled 2024-05-25: qty 1

## 2024-05-25 MED ORDER — TECHNETIUM TC 99M MEBROFENIN IV KIT
5.2000 | PACK | Freq: Once | INTRAVENOUS | Status: AC | PRN
  Administered 2024-05-25: 5.2 via INTRAVENOUS

## 2024-05-25 MED ORDER — ANTICOAGULANT SODIUM CITRATE 4% (200MG/5ML) IV SOLN
5.0000 mL | Status: DC | PRN
  Filled 2024-05-25: qty 5

## 2024-05-25 MED ORDER — SEVELAMER CARBONATE 800 MG PO TABS
1600.0000 mg | ORAL_TABLET | Freq: Three times a day (TID) | ORAL | Status: AC
  Administered 2024-05-26 – 2024-06-02 (×10): 1600 mg via ORAL
  Filled 2024-05-25 (×3): qty 2

## 2024-05-25 MED ORDER — LIDOCAINE HCL (PF) 1 % IJ SOLN: 5.0000 mL | INTRAMUSCULAR | Status: DC | PRN

## 2024-05-25 MED ORDER — ACETAMINOPHEN 325 MG PO TABS
650.0000 mg | ORAL_TABLET | Freq: Four times a day (QID) | ORAL | Status: DC | PRN
  Administered 2024-05-26 – 2024-05-29 (×4): 650 mg via ORAL
  Filled 2024-05-25 (×2): qty 2

## 2024-05-25 MED ORDER — LIDOCAINE-PRILOCAINE 2.5-2.5 % EX CREA: 1.0000 | TOPICAL_CREAM | CUTANEOUS | Status: DC | PRN

## 2024-05-25 NOTE — Progress Notes (Signed)
 Pt recd evening meal- and juice- BCG 69 addressed

## 2024-05-25 NOTE — Progress Notes (Signed)
 Received patient in sign out.  Briefly this is a 73 y.o. male with ESRD on HD MWF, CAD status post CABG in 2018, COPD, CVA, type 2 diabetes, PAD, hypertension, hyperlipidemia who presented to the ED w/ weakness and hypoglycemia. Found to be afebrile. No tachycardia or hypotension. WBC 6.1. AST 67. LFT's otherwise wnl. Lipase wnl. RUQ US  Gallbladder wall thickening and pericholecystic fluid. No definite evidence of calcified gallstones. Common bile duct is within normal limits.  Pt missed HD recently and is hyperkalemic with Cr 11 and BUN 77. Going to HD.  Agree with HIDA to r/o acalculous cholecystitis.  We will plan to see if HIDA is positive. Suspect if positive will need abx and perc chole.  If HIDA is negative we will plan to sign off.  Please call earlier if needed.   Shawn Collins , [facility] 05/25/2024, 8:07 AM Please see Amion for pager number during day hours 7:00am-4:30pm

## 2024-05-25 NOTE — Progress Notes (Signed)
 Received patient in bed to unit.  Alert and oriented.  Informed consent signed and in chart.   TX duration: 2HR 55MIN; Pt requested to end tx early d/t increased pain. AMA signed and pt verbalized understanding.  Patient tolerated well.  Transported back to the room  Alert, without acute distress.  Hand-off given to patient's nurse.   Access used: L. AVG Access issues: N/A  Total UF removed: Medication(s) given: Fentanyl  50mcg injection Post HD VS: 98.4 oral, BP 157/70, HR 74  05/25/24 1049  Vitals  Temp 98.4 F (36.9 C)  Temp Source Oral  BP (!) 157/70  MAP (mmHg) 95  BP Location Right Arm  BP Method Automatic  Patient Position (if appropriate) Lying  Pulse Rate 74  Pulse Rate Source Monitor  ECG Heart Rate 74  Resp 20  Oxygen Therapy  SpO2 96 %  O2 Device Room Air  Patient Activity (if Appropriate) In bed  Pulse Oximetry Type Continuous  Oximetry Probe Site Changed No  During Treatment Monitoring  Intra-Hemodialysis Comments Tolerated well;Tx completed (Tx ended early per pt request d/t increased pain)  Post Treatment  Dialyzer Clearance Lightly streaked  Hemodialysis Intake (mL) 0 mL  Liters Processed 70.7  Fluid Removed (mL) 2400 mL  Tolerated HD Treatment Yes  AVG/AVF Arterial Site Held (minutes) 10 minutes  AVG/AVF Venous Site Held (minutes) 10 minutes  Fistula / Graft Left Upper arm Arteriovenous vein graft  Placement Date/Time: 12/21/21 1103   Orientation: Left  Access Location: Upper arm  Access Type: Arteriovenous vein graft  Site Condition No complications  Fistula / Graft Assessment Present;Thrill;Bruit  Status Deaccessed  Needle Size 15gx2  Drainage Description None    Post HD weight: unable to weight d/t specialty bed.   Carlyon Rhein, RN [facility]

## 2024-05-25 NOTE — H&P (Signed)
 " History and Physical    Shawn Collins FMW:[id number] DOB: 12-13-1950 DOA: 05/24/2024  PCP: Shelda Atlas, MD  Patient coming from: Home  Chief Complaint: Generalized weakness  HPI: Shawn Collins is a 73 y.o. male with medical history significant of ESRD on HD MWF, CAD status post CABG in 2018, COPD, CVA, type 2 diabetes, PAD, hypertension, hyperlipidemia, medication noncompliance.  Patient recently had clotted left forearm graft and was admitted by vascular surgery service and taken to the OR on 12/11 for open thrombectomy of the left forearm AV loop graft and revision of left forearm loop graft with interposition grafting from the venous limb to the basal leg vein.  Patient presents to the ED today via EMS for evaluation of generalized weakness and hypoglycemia.  Blood glucose was 53 with EMS and was given D10 with improvement of glucose to 117.  Patient AAO x 4.  Vital signs on arrival to the ED: Temperature 97.5 F, pulse 65, respiratory rate 16, blood pressure 238/89, and SpO2 95-96% on room air.  Labs notable for hemoglobin 10.3 (stable), sodium 133, chloride 93, bicarb 21, anion gap 18, glucose 66, AST 62 but remainder of LFTs normal, lipase normal, UA pending.  Potassium 6.3 on initial BMP but there was concern for possible hemolysis so i-STAT chemistry repeated which confirmed hyperkalemia with potassium of 6.0.  Chest x-ray showing heart size at upper limits of normal and no acute findings.  EKG showing normal sinus rhythm, slightly peaked T waves, and no acute ischemic changes.   CT abdomen pelvis showing: IMPRESSION: 1. Poorly visualized, suggestion of gallbladder wall thickening and pericholecystic fluid. Recommend correlation with liver function tests. If clinically indicated, further evaluation with right upper quadrant ultrasound for acute cholecystitis. 2. Trace shallow left inguinal hernia containing fat, ascites, and the antimesenteric wall of the sigmoid colon. No findings to  suggest incarceration or obstruction of the bowel. 3. Trace right pleural effusion. 4. 3 cm simple free fluid consistent with ascites. 5. Cluster of left lower lobe peribronchovascular pulmonary nodules measuring up to 7 x 6 mm. Findings suggestive of infectious/inflammation. Follow-up noncontrast chest CT in 6-12 months recommended per Fleischner Society Guidelines (and if high-risk, repeat noncontrast chest CT at 18-24 months). 6. Severe atherosclerotic plaque. 7. Limited evaluation of this noncontrast study.   Abdominal right upper quadrant ultrasound showing acute acalculous cholecystitis and trace simple free fluid ascites   Patient was given Tylenol , albuterol , clonidine , fentanyl , IV dextrose , Lokelma , calcium  gluconate, ceftriaxone , and metronidazole .  Nephrology (Dr. Dolan) consulted and planning on dialysis in the morning.  General surgery (Dr. Paola) recommended continuing antibiotics and will consult.  TR[facility] called to admit.  Patient is a poor historian.  He is not able to tell me why exactly he came to the ED today.  Resting comfortably.  Initially stated he was having pain all over his body and requested pain medication but later stated he was not in any pain.  Reports missing dialysis due to the holidays.  His last dialysis treatment was 4 days ago.  He is not on insulin  or any oral hypoglycemic agents for diabetes.  He reports very poor oral intake/barely eating anything for the past 3 days.  He is concerned that he has lost vision in both of his eyes over time which has affected the quality of his life and his ability to take his medications consistently.  He lives alone.  He is seen by ophthalmology for his chronic vision problems.  He reports feeling depressed but  denies any suicidal thoughts/plan/ideation.  Denies abdominal pain, nausea, or vomiting.  Denies fevers or chills.  Denies chest pain or shortness of breath.  Review of Systems:  Review of Systems  All other systems  reviewed and are negative.   Past Medical History:  Diagnosis Date   CAD (coronary artery disease), native coronary artery    s/p NSTEMI with cath showing severe 3VD s/p  CABG x 3 with LIMA to LAD, SVG to OM1/OM2 and SVG D1 by Dr. Army on 10/26/16.   COPD (chronic obstructive pulmonary disease) (HCC) 09/10/2014   CVA (cerebral vascular accident) (HCC) 12/18/2021   Diabetes mellitus without complication (HCC)    Type II   ESRD (end stage renal disease) (HCC)    Hyperlipidemia LDL goal <70 04/17/2017   Hypertension     Past Surgical History:  Procedure Laterality Date   A/V SHUNT INTERVENTION N/A 08/17/2023   Procedure: A/V SHUNT INTERVENTION;  Surgeon: Norine Manuelita LABOR, MD;  Location: [facility] INVASIVE CV LAB;  Service: Cardiovascular;  Laterality: N/A;   AV FISTULA PLACEMENT Left 12/21/2021   Procedure: INSERTION OF LEFT ARM ARTERIOVENOUS (AV) GORE-TEX GRAFT;  Surgeon: Eliza Lonni RAMAN, MD;  Location: [facility] OR;  Service: Vascular;  Laterality: Left;   CORONARY ARTERY BYPASS GRAFT N/A 10/26/2016   Procedure: CORONARY ARTERY BYPASS GRAFTING (CABG) x 4 USING LEFT INTERNAL MAMMARY ARTERY TO LAD AND ENDOSCOPICALLY HARVESTED GREATER SAPHENOUS VEIN TO OM 1, 2 AND TO DIAG;  Surgeon: Army Dallas NOVAK, MD;  Location: [facility] OR;  Service: Open Heart Surgery;  Laterality: N/A;   ENDOVEIN HARVEST OF GREATER SAPHENOUS VEIN Right 10/26/2016   Procedure: ENDOVEIN HARVEST OF GREATER SAPHENOUS VEIN;  Surgeon: Army Dallas NOVAK, MD;  Location: [facility] OR;  Service: Open Heart Surgery;  Laterality: Right;   IR THORACENTESIS ASP PLEURAL SPACE W/IMG GUIDE  11/03/2016   LEFT HEART CATH AND CORONARY ANGIOGRAPHY N/A 10/21/2016   Procedure: Left Heart Cath and Coronary Angiography;  Surgeon: Jordan, Peter M, MD;  Location: [facility] INVASIVE CV LAB;  Service: Cardiovascular;  Laterality: N/A;   TEE WITHOUT CARDIOVERSION N/A 10/26/2016   Procedure: TRANSESOPHAGEAL ECHOCARDIOGRAM (TEE);  Surgeon: Army Dallas NOVAK, MD;  Location: [facility]  OR;  Service: Open Heart Surgery;  Laterality: N/A;   THROMBECTOMY W/ EMBOLECTOMY Left 05/09/2024   Procedure: THROMBECTOMY AND REVISION OF LEFT FOREARM ARTERIOVENOUS GORE-TEX GRAFT, USING GORE-TEX 7 MM STRETCH AS INTERPOSITION GRAFT;  Surgeon: Pearline Norman RAMAN, MD;  Location: [facility] OR;  Service: Vascular;  Laterality: Left;     reports that he quit smoking about 54 years ago. His smoking use included cigarettes. He started smoking about 62 years ago. He has a 2 pack-year smoking history. He has never used smokeless tobacco. He reports that he does not currently use alcohol. He reports that he does not currently use drugs.  Allergies[1]  Family History  Problem Relation Age of Onset   Diabetes Mellitus II Mother    Stroke Father    Hypertension Father    Diabetes Mellitus II Sister     Prior to Admission medications  Medication Sig Start Date End Date Taking? Authorizing Provider  amLODipine  (NORVASC ) 10 MG tablet Take 1 tablet (10 mg total) by mouth at bedtime. 07/20/23   Rojelio Nest, DO  aspirin  81 MG chewable tablet Chew 1 tablet (81 mg total) by mouth daily. 07/20/23   Rojelio Nest, DO  atorvastatin  (LIPITOR ) 40 MG tablet Take 1 tablet (40 mg total) by mouth at bedtime. 07/20/23   Rojelio Nest,  DO  cloNIDine  (CATAPRES ) 0.1 MG tablet Take 1 tablet (0.1 mg total) by mouth 2 (two) times daily. Patient not taking: Reported on 05/09/2024 03/29/24   Tobie Yetta HERO, MD  clopidogrel  (PLAVIX ) 75 MG tablet Take 75 mg by mouth daily. 01/14/24   [provider]  hydrALAZINE  (APRESOLINE ) 50 MG tablet Take 1 tablet (50 mg total) by mouth every 8 (eight) hours. 07/20/23   Rojelio Nest, DO  losartan  (COZAAR ) 100 MG tablet Take 1 tablet (100 mg total) by mouth daily. 07/20/23   Rojelio Nest, DO  nitroGLYCERIN  (NITROSTAT ) 0.4 MG SL tablet Place 0.4 mg under the tongue every 5 (five) minutes as needed for chest pain.    [provider]  Nutritional Supplements (FEEDING SUPPLEMENT,  NEPRO CARB STEADY,) LIQD Take 237 mLs by mouth 3 (three) times daily between meals. 03/29/24   Tobie Yetta HERO, MD  oxyCODONE  (ROXICODONE ) 5 MG immediate release tablet Take 1 tablet (5 mg total) by mouth every 4 (four) hours as needed for severe pain (pain score 7-10). 05/09/24   Schuh, McKenzi P, PA-C  sevelamer  carbonate (RENVELA ) 800 MG tablet Take 800-1,600 mg by mouth See admin instructions. Take 2 tablets by mouth tid daily with meals and 1 tablet bid with snacks 04/02/24   [provider]  spironolactone  (ALDACTONE ) 25 MG tablet Take 25 mg by mouth daily. 08/23/23   [provider]    Physical Exam: Vitals:   05/24/24 2149 05/24/24 2150 05/24/24 2151 05/24/24 2152  BP:  (!) 182/72    Pulse: 61 60 (!) 52   Resp: (!) 22 18 (!) 22   Temp:    98 F (36.7 C)  TempSrc:    Oral  SpO2: 99% 99% 99%   Weight:      Height:        Physical Exam Vitals reviewed.  Constitutional:      General: He is not in acute distress. HENT:     Head: Normocephalic and atraumatic.  Eyes:     Extraocular Movements: Extraocular movements intact.  Cardiovascular:     Rate and Rhythm: Normal rate and regular rhythm.  Pulmonary:     Effort: Pulmonary effort is normal. No respiratory distress.     Breath sounds: Normal breath sounds. No stridor. No wheezing, rhonchi or rales.  Abdominal:     General: Bowel sounds are normal.     Palpations: Abdomen is soft.     Tenderness: There is no abdominal tenderness. There is no guarding.  Musculoskeletal:     Cervical back: Normal range of motion.     Right lower leg: No edema.     Left lower leg: No edema.  Skin:    General: Skin is warm and dry.  Neurological:     General: No focal deficit present.     Mental Status: He is alert and oriented to person, place, and time.     Sensory: No sensory deficit.     Motor: No weakness.     Labs on Admission: I have personally reviewed following labs and imaging studies  CBC: Recent Labs   Lab 05/20/24 2022 05/24/24 1603 05/24/24 2137  WBC 6.3 5.3  --   NEUTROABS 4.4 4.2  --   HGB 10.0* 10.3* 11.6*  HCT 33.2* 32.4* 34.0*  MCV 104.4* 99.4  --   PLT 307 263  --    Basic Metabolic Panel: Recent Labs  Lab 05/20/24 2022 05/24/24 2109 05/24/24 2137  NA 134* 133* 131*  K 4.9 6.3* 6.0*  CL 95* 93* 99  CO2 25 21*  --   GLUCOSE 101* 66* 63*  BUN 40* 76* 85*  CREATININE 6.60* 10.80* 12.10*  CALCIUM  9.5 8.9  --    GFR: Estimated Creatinine Clearance: 4.6 mL/min (A) (by C-G formula based on SCr of 12.1 mg/dL (H)). Liver Function Tests: Recent Labs  Lab 05/24/24 2109  AST 62*  ALT 26  ALKPHOS 94  BILITOT 0.5  PROT 7.4  ALBUMIN  4.0   Recent Labs  Lab 05/24/24 2109  LIPASE 12   No results for input(s): AMMONIA in the last 168 hours. Coagulation Profile: No results for input(s): INR, PROTIME in the last 168 hours. Cardiac Enzymes: No results for input(s): CKTOTAL, CKMB, CKMBINDEX, TROPONINI in the last 168 hours. BNP (last 3 results) No results for input(s): PROBNP in the last 8760 hours. HbA1C: No results for input(s): HGBA1C in the last 72 hours. CBG: Recent Labs  Lab 05/24/24 1602 05/25/24 0013 05/25/24 0046  GLUCAP 107* 57* 132*   Lipid Profile: No results for input(s): CHOL, HDL, LDLCALC, TRIG, CHOLHDL, LDLDIRECT in the last 72 hours. Thyroid  Function Tests: No results for input(s): TSH, T4TOTAL, FREET4, T3FREE, THYROIDAB in the last 72 hours. Anemia Panel: No results for input(s): VITAMINB12, FOLATE, FERRITIN, TIBC, IRON, RETICCTPCT in the last 72 hours. Urine analysis:    Component Value Date/Time   COLORURINE YELLOW 05/24/2024 2109   APPEARANCEUR CLEAR 05/24/2024 2109   LABSPEC 1.011 05/24/2024 2109   PHURINE 7.0 05/24/2024 2109   GLUCOSEU 50 (A) 05/24/2024 2109   HGBUR NEGATIVE 05/24/2024 2109   BILIRUBINUR NEGATIVE 05/24/2024 2109   KETONESUR NEGATIVE 05/24/2024 2109   PROTEINUR  100 (A) 05/24/2024 2109   UROBILINOGEN 1.0 06/27/2010 0631   NITRITE NEGATIVE 05/24/2024 2109   LEUKOCYTESUR NEGATIVE 05/24/2024 2109    Radiological Exams on Admission: US  Abdomen Limited RUQ (LIVER/GB) Result Date: 05/24/2024 EXAM: Right Upper Quadrant Abdominal Ultrasound 05/24/2024 08:54:06 PM TECHNIQUE: Real-time ultrasonography of the right upper quadrant of the abdomen was performed. COMPARISON: CT abdomen and pelvis 05/24/2024. CLINICAL HISTORY: RUQ pain. FINDINGS: LIVER: Normal echogenicity. No intrahepatic biliary ductal dilatation. No evidence of mass. Hepatopetal flow in the portal vein. BILIARY SYSTEM: Gallbladder wall thickening and pericholecystic fluid. No definite evidence of calcified gallstones. Common bile duct is within normal limits measuring  mm. RIGHT KIDNEY: No hydronephrosis. No echogenic calculi. No mass. PANCREAS: Visualized portions of the pancreas are unremarkable. OTHER: Trace a simple free fluid ascites. IMPRESSION: 1. Acute acalculous cholecystitis. 2. Trace simple free fluid ascites. Electronically signed by: Morgane Naveau MD 05/24/2024 10:09 PM EST RP Workstation: [id number]   CT ABDOMEN PELVIS WO CONTRAST Result Date: 05/24/2024 EXAM: CT ABDOMEN AND PELVIS WITHOUT CONTRAST 05/24/2024 05:26:00 PM TECHNIQUE: CT of the abdomen and pelvis was performed without the administration of intravenous contrast. Multiplanar reformatted images are provided for review. Automated exposure control, iterative reconstruction, and/or weight-based adjustment of the mA/kV was utilized to reduce the radiation dose to as low as reasonably achievable. COMPARISON: None available. CLINICAL HISTORY: Abdominal pain, acute, nonlocalized. FINDINGS: LOWER CHEST: Trace right pleural effusion. Cluster of peribronchovascular pulmonary nodules within the left medial lower lobe with nodules measuring up to 7 x 6 mm. LIVER: The liver is unremarkable. GALLBLADDER AND BILE DUCTS: Gallbladder wall  thickening and pericholecystic fluid. No biliary ductal dilatation. SPLEEN: No acute abnormality. PANCREAS: No acute abnormality. ADRENAL GLANDS: No acute abnormality. KIDNEYS, URETERS AND BLADDER: Bilateral kidneys are atrophic. Calcifications associated with the kidneys are likely  vascular. No stones in the kidneys or ureters. No hydronephrosis. No perinephric or periureteral stranding. Urinary bladder is unremarkable. GI AND BOWEL: Stomach demonstrates no acute abnormality. No small or large bowel thickening or dilatation. The appendix is not definitely identified. PERITONEUM AND RETROPERITONEUM: Trace volume abdominal and pelvic simple free fluid ascites. No free air. VASCULATURE: Aorta is normal in caliber. Severe atherosclerotic plaque. LYMPH NODES: No lymphadenopathy. REPRODUCTIVE ORGANS: At least small volume right hydrocele. BONES AND SOFT TISSUES: Small fat-containing umbilical hernia. Trace shallow left inguinal hernia containing fat, free fluid ascites, and the antimesenteric wall of the sigmoid colon (3:66). No acute osseous abnormality. No focal soft tissue abnormality. IMPRESSION: 1. Poorly visualized, suggestion of gallbladder wall thickening and pericholecystic fluid. Recommend correlation with liver function tests. If clinically indicated, further evaluation with right upper quadrant ultrasound for acute cholecystitis. 2. Trace shallow left inguinal hernia containing fat, ascites, and the antimesenteric wall of the sigmoid colon. No findings to suggest incarceration or obstruction of the bowel. 3. Trace right pleural effusion. 4. 3 cm simple free fluid consistent with ascites. 5. Cluster of left lower lobe peribronchovascular pulmonary nodules measuring up to 7 x 6 mm. Findings suggestive of infectious/inflammation. Follow-up noncontrast chest CT in 6-12 months recommended per Fleischner Society Guidelines (and if high-risk, repeat noncontrast chest CT at 18-24 months). 6. Severe atherosclerotic  plaque. 7. Limited evaluation of this noncontrast study. Electronically signed by: Morgane Naveau MD 05/24/2024 07:06 PM EST RP Workstation: [id number]   DG Chest 2 View Result Date: 05/24/2024 EXAM: 2 VIEW(S) XRAY OF THE CHEST 05/24/2024 04:51:00 PM COMPARISON: 03/22/2024 CLINICAL HISTORY: cough FINDINGS: LUNGS AND PLEURA: No focal pulmonary opacity. No pleural effusion. No pneumothorax. HEART AND MEDIASTINUM: Heart size at upper limits of normal. CABG markers noted. Sternotomy wires noted. BONES AND SOFT TISSUES: No acute osseous abnormality. IMPRESSION: 1. No acute findings. 2. Heart size at upper limits of normal. Electronically signed by: Greig Pique MD 05/24/2024 05:52 PM EST RP Workstation: [id number]    Assessment and Plan  Hyperkalemia in the setting of missed hemodialysis ESRD on HD MWF Insulin  cannot be given due to hypoglycemia.  Patient was given albuterol , Lokelma , and calcium  gluconate in the ED.  Nephrology consulted and planning for dialysis in the morning.  Continue to monitor potassium level.  Hold home spironolactone  and losartan .  Acute acalculous cholecystitis noted on ultrasound No nausea or vomiting.  No abdominal pain or tenderness.  No fever, leukocytosis, or signs of sepsis.  General Surgery consulted.  Keep n.p.o. for now except sips with meds. Continue antibiotics including ceftriaxone  and metronidazole .  Hold aspirin  and Plavix .  Hypoglycemia Type 2 diabetes Hypoglycemic in the setting of very poor p.o. intake.  Not on insulin  or oral hypoglycemic agents.  Last hemoglobin A1c 5.0 in October 2025.  CBG now improved to 130s after IV dextrose .  He is currently n.p.o. given imaging findings concerning for cholecystitis/pending general surgery evaluation.  Continue CBG checks every 4 hours and IV D50 PRN hypoglycemia.  Hypertensive urgency Due to medication noncompliance.  Patient was given a dose of his home clonidine  in the ED and SBP now improved to 180s.  Continue  home oral meds including clonidine , amlodipine , and hydralazine .  IV hydralazine  PRN SBP >160.  Hold spironolactone  and losartan  in the setting of hyperkalemia.  Depression Patient attributes his depression to his chronic vision problems/progressive vision loss for which he is seen by ophthalmology.  He denies any suicidal thoughts/plan/ideation.  He will need close outpatient follow-up with ophthalmology  and psychiatry.  If he continues to endorse depression, then consider consulting psychiatry inpatient.  Since he lives alone and is having difficulty taking care of himself, [facility] consulted for nursing home placement.  Left lower lobe peribronchovascular pulmonary nodules Noted on CT and radiologist feels could be infectious versus inflammatory in etiology.  Infection less likely as he is not endorsing any respiratory symptoms and has no fever or leukocytosis.  Patient will need outpatient follow-up for repeat noncontrast CT chest in 6 months for further evaluation.  Anemia of chronic disease Hemoglobin stable, monitor labs.  COPD Stable, no signs of acute exacerbation.  Does not use any inhalers at home.  CAD status post CABG: Not endorsing any anginal symptoms and EKG without acute ischemic changes. History of CVA PAD Hold aspirin  and Plavix  given finding of acute cholecystitis on ultrasound and pending evaluation by general surgery.  Continue Lipitor .  DVT prophylaxis: SCDs Code Status: DNR pre-arrest interventions desired (discussed with the patient) Level of care: Progressive Care Unit Admission status: It is my clinical opinion that admission to INPATIENT is reasonable and necessary because of the expectation that this patient will require hospital care that crosses at least 2 midnights to treat this condition based on the medical complexity of the problems presented.  Given the aforementioned information, the predictability of an adverse outcome is felt to be significant.  Editha Ram MD [facility]  If 7PM-7AM, please contact night-coverage [email or web address]  05/25/2024, 1:16 AM       [1] No Known Allergies  "

## 2024-05-25 NOTE — ED Notes (Signed)
 Awaiting patient to return from [facility]

## 2024-05-25 NOTE — Progress Notes (Signed)
 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 Rollin at the time in the hospital Recent hospitalization hypertensive emergency 10/24 through 03/29/2024 and had hypertensive encephalopathy in setting of ESRD Recent admission 12/11 for clotted access and AV graft repair by Dr. Pearline Fowler to hospital with weakness hypoglycemia in the setting of missed dialysis hyperkalemic 6.3 emergently dialyzed RUQ showed gallbladder wall thickening HIDA scan performed which was negative General Surgery saw and signed off  S Asking to eat-complaining of groin pain-nursing clarifies that he did not stay on dialysis to hold 3 hours he was cramping so requested to stop He has been n.p.o. since this morning--wants to eat.    O/e BP (!) 177/69   Pulse 100   Temp 99.8 F (37.7 C) (Oral)   Resp 18   Ht 5' 10 (1.778 m)   Wt 56.3 kg   SpO2 97%   BMI 17.81 kg/m - Looks well feels fair no distress CTAB no rales rhonchi wheeze-frail habitus supraclavicular bitemporal wasting S1-S2 RRR-telemetry reviewed mild sinus tach this morning Abdomen soft no rebound no guarding-no HSM no abdominal distention No lower extremity edema Postop changes to left arm where fistula was repaired just several weeks ago No lower extremity edema   Plan Defer to nephrology-dialyzed emergently for hyperkalemia- Not really sure but it seems like he is on Aldactone  25 in the setting of HD which may account for the hyperkalemia?-Unclear who prescribed same-probably should never be given again to him in the outpatient setting because of risk for hyperkalemia-expect that this will trend downwards going forward Looking at the [facility] patient  is not on clonidine  anymore at home apparently per records so I have discontinued it and that is probably safer because of risk of severe rebound hypertension Continue hydralazine  50 every 8 It looks like dialysis went well today-his left arm still looks a little funky from the recent surgery so I am hopeful that the access holes Nephrology is aware of him and have been following  He asked me questions about his poor vision-although he does not take insulin  or any injectable medications he tells me that he lives alone and with impending visual issues he may need some assistance-consult TOC-PT OT to ensure that he is able to manage at home if he goes home

## 2024-05-25 NOTE — Progress Notes (Signed)
 Patient received to 4E 01, AO, breathing even and unlabored in RA. CHG wipe completed, connected to tele and CCMD notified. Oriented patient to room and call bell system. Call bell within reach. Plan of care continues.

## 2024-05-25 NOTE — ED Notes (Signed)
 To Nuc med now

## 2024-05-25 NOTE — Consult Note (Signed)
 [facility] Renal Consultation Note    Indication for Consultation:  Management of ESRD/hemodialysis; anemia, hypertension/volume and secondary hyperparathyroidism   HPI: Shawn Collins is a 73 y.o. male with ESRD on HD, HTN, DM, CAD s/p CABG, prior CVA who presented to the ED with weakness and hypoglycemia. Treated with dextrose . Labs on arrival notable for Na 133, K 6.3, BUN 77 Bicarb 18, Hgb 10.3. Nephrology consulted for dialysis. Also presented with abdominal pain. RUQ US  notable for gallbladder thickening. Surgery consulted  Seen and examined in dialysis unit. Receiving dialysis via AVF. BP elevated. Attempting 3L UF. States his abdominal pain is better after receiving pain meds. He denies chest pain, sob.   Last HD 12/23 for 1:45 mins. Did not reach dry weight. He had uncontrolled HTN with questionable medication compliance.   Past Medical History:  Diagnosis Date   CAD (coronary artery disease), native coronary artery    s/p NSTEMI with cath showing severe 3VD s/p  CABG x 3 with LIMA to LAD, SVG to OM1/OM2 and SVG D1 by Dr. Army on 10/26/16.   COPD (chronic obstructive pulmonary disease) (HCC) 09/10/2014   CVA (cerebral vascular accident) (HCC) 12/18/2021   Diabetes mellitus without complication (HCC)    Type II   ESRD (end stage renal disease) (HCC)    Hyperlipidemia LDL goal <70 04/17/2017   Hypertension    Past Surgical History:  Procedure Laterality Date   A/V SHUNT INTERVENTION N/A 08/17/2023   Procedure: A/V SHUNT INTERVENTION;  Surgeon: Norine Manuelita LABOR, MD;  Location: [facility] INVASIVE CV LAB;  Service: Cardiovascular;  Laterality: N/A;   AV FISTULA PLACEMENT Left 12/21/2021   Procedure: INSERTION OF LEFT ARM ARTERIOVENOUS (AV) GORE-TEX GRAFT;  Surgeon: Eliza Lonni RAMAN, MD;  Location: [facility] OR;  Service: Vascular;  Laterality: Left;   CORONARY ARTERY BYPASS GRAFT N/A 10/26/2016   Procedure: CORONARY ARTERY BYPASS GRAFTING (CABG) x 4 USING LEFT INTERNAL  MAMMARY ARTERY TO LAD AND ENDOSCOPICALLY HARVESTED GREATER SAPHENOUS VEIN TO OM 1, 2 AND TO DIAG;  Surgeon: Army Dallas NOVAK, MD;  Location: [facility] OR;  Service: Open Heart Surgery;  Laterality: N/A;   ENDOVEIN HARVEST OF GREATER SAPHENOUS VEIN Right 10/26/2016   Procedure: ENDOVEIN HARVEST OF GREATER SAPHENOUS VEIN;  Surgeon: Army Dallas NOVAK, MD;  Location: [facility] OR;  Service: Open Heart Surgery;  Laterality: Right;   IR THORACENTESIS ASP PLEURAL SPACE W/IMG GUIDE  11/03/2016   LEFT HEART CATH AND CORONARY ANGIOGRAPHY N/A 10/21/2016   Procedure: Left Heart Cath and Coronary Angiography;  Surgeon: Jordan, Peter M, MD;  Location: [facility] INVASIVE CV LAB;  Service: Cardiovascular;  Laterality: N/A;   TEE WITHOUT CARDIOVERSION N/A 10/26/2016   Procedure: TRANSESOPHAGEAL ECHOCARDIOGRAM (TEE);  Surgeon: Army Dallas NOVAK, MD;  Location: [facility] OR;  Service: Open Heart Surgery;  Laterality: N/A;   THROMBECTOMY W/ EMBOLECTOMY Left 05/09/2024   Procedure: THROMBECTOMY AND REVISION OF LEFT FOREARM ARTERIOVENOUS GORE-TEX GRAFT, USING GORE-TEX 7 MM STRETCH AS INTERPOSITION GRAFT;  Surgeon: Pearline Norman RAMAN, MD;  Location: [facility] OR;  Service: Vascular;  Laterality: Left;   Family History  Problem Relation Age of Onset   Diabetes Mellitus II Mother    Stroke Father    Hypertension Father    Diabetes Mellitus II Sister    Social History:  reports that he quit smoking about 54 years ago. His smoking use included cigarettes. He started smoking about 62 years ago. He has a 2 pack-year smoking history. He has never used smokeless tobacco. He reports that  he does not currently use alcohol. He reports that he does not currently use drugs. Allergies[1] Prior to Admission medications  Medication Sig Start Date End Date Taking? Authorizing Provider  amLODipine  (NORVASC ) 10 MG tablet Take 1 tablet (10 mg total) by mouth at bedtime. 07/20/23  Yes Rojelio Nest, DO  clopidogrel  (PLAVIX ) 75 MG tablet Take 75 mg by mouth daily. 01/14/24   Yes [provider]  hydrALAZINE  (APRESOLINE ) 50 MG tablet Take 1 tablet (50 mg total) by mouth every 8 (eight) hours. 07/20/23  Yes Rojelio Nest, DO  losartan  (COZAAR ) 100 MG tablet Take 1 tablet (100 mg total) by mouth daily. 07/20/23  Yes Rojelio Nest, DO  nitroGLYCERIN  (NITROSTAT ) 0.4 MG SL tablet Place 0.4 mg under the tongue every 5 (five) minutes as needed for chest pain.   Yes [provider]  sevelamer  carbonate (RENVELA ) 800 MG tablet Take 800-1,600 mg by mouth See admin instructions. Take 2 tablets by mouth tid daily with meals and 1 tablet bid with snacks 04/02/24  Yes [provider]  spironolactone  (ALDACTONE ) 25 MG tablet Take 25 mg by mouth daily. 08/23/23  Yes [provider]  atorvastatin  (LIPITOR ) 40 MG tablet Take 1 tablet (40 mg total) by mouth at bedtime. 07/20/23   Rojelio Nest, DO  cloNIDine  (CATAPRES ) 0.1 MG tablet Take 1 tablet (0.1 mg total) by mouth 2 (two) times daily. Patient not taking: Reported on 05/25/2024 03/29/24   Tobie Yetta HERO, MD   Current Facility-Administered Medications  Medication Dose Route Frequency Provider Last Rate Last Admin   acetaminophen  (TYLENOL ) tablet 650 mg  650 mg Oral Q6H PRN Alfornia Madison, MD       Or   acetaminophen  (TYLENOL ) suppository 650 mg  650 mg Rectal Q6H PRN Alfornia Madison, MD       amLODipine  (NORVASC ) tablet 10 mg  10 mg Oral QHS Rathore, Vasundhra, MD   10 mg at 05/25/24 0319   atorvastatin  (LIPITOR ) tablet 40 mg  40 mg Oral QHS Rathore, Vasundhra, MD   40 mg at 05/25/24 0320   cefTRIAXone  (ROCEPHIN ) 2 g in sodium chloride  0.9 % 100 mL IVPB  2 g Intravenous Daily Alfornia Madison, MD       Chlorhexidine  Gluconate Cloth 2 % PADS 6 each  6 each Topical Q0600 Dolan Mateo Larger, MD       cloNIDine  (CATAPRES ) tablet 0.1 mg  0.1 mg Oral BID Alfornia Madison, MD       dextrose  50 % solution 12.5 g  12.5 g Intravenous PRN Rathore, Vasundhra, MD       hydrALAZINE  (APRESOLINE )  injection 5 mg  5 mg Intravenous Q6H PRN Rathore, Vasundhra, MD       hydrALAZINE  (APRESOLINE ) tablet 50 mg  50 mg Oral Q8H Rathore, Vasundhra, MD   50 mg at 05/25/24 0536   metroNIDAZOLE  (FLAGYL ) IVPB 500 mg  500 mg Intravenous Q12H Alfornia Madison, MD       naloxone  (NARCAN ) injection 0.4 mg  0.4 mg Intravenous PRN Rathore, Vasundhra, MD       sevelamer  carbonate (RENVELA ) tablet 1,600 mg  1,600 mg Oral TID WC Rathore, Vasundhra, MD       sevelamer  carbonate (RENVELA ) tablet 800 mg  800 mg Oral PRN Rathore, Vasundhra, MD   800 mg at 05/25/24 0319     ROS: As per HPI otherwise negative.  Physical Exam: Vitals:   05/25/24 0728 05/25/24 0732 05/25/24 0740 05/25/24 0800  BP:  (!) 157/85 (!) 157/77 (!) 160/144  Pulse:  77 71 73  Resp:  20 20 18   Temp: 98.5 F (36.9 C)     TempSrc: Oral     SpO2:  95% 96% 96%  Weight:      Height:         General: Alert, frail appearing, nad  Head: NCAT sclera not icteric  CV: Regular rate, no murmur, no rub  Pulm: normal respiratory effort, lungs clear  Abdomen: non-tender, no masses  Lower extremities: no edema  Neuro: A & O X 3. Moves all extremities spontaneously. Psych:  Normal affect  Dialysis Access: L AVG +t/b  Labs: Basic Metabolic Panel: Recent Labs  Lab 05/20/24 2022 05/24/24 2109 05/24/24 2137 05/25/24 0242  NA 134* 133* 131* 131*  K 4.9 6.3* 6.0* 5.5*  CL 95* 93* 99 92*  CO2 25 21*  --  17*  GLUCOSE 101* 66* 63* 107*  BUN 40* 76* 85* 77*  CREATININE 6.60* 10.80* 12.10* 11.00*  CALCIUM  9.5 8.9  --  8.8*   Liver Function Tests: Recent Labs  Lab 05/24/24 2109 05/25/24 0242  AST 62* 67*  ALT 26 24  ALKPHOS 94 83  BILITOT 0.5 0.4  PROT 7.4 7.0  ALBUMIN  4.0 3.8   Recent Labs  Lab 05/24/24 2109  LIPASE 12   No results for input(s): AMMONIA in the last 168 hours. CBC: Recent Labs  Lab 05/20/24 2022 05/24/24 1603 05/24/24 2137 05/25/24 0242  WBC 6.3 5.3  --  6.1  NEUTROABS 4.4 4.2  --   --   HGB 10.0*  10.3* 11.6* 9.4*  HCT 33.2* 32.4* 34.0* 29.8*  MCV 104.4* 99.4  --  100.0  PLT 307 263  --  254   Cardiac Enzymes: No results for input(s): CKTOTAL, CKMB, CKMBINDEX, TROPONINI in the last 168 hours. CBG: Recent Labs  Lab 05/24/24 1602 05/25/24 0013 05/25/24 0046 05/25/24 0413  GLUCAP 107* 57* 132* 103*   Iron Studies: No results for input(s): IRON, TIBC, TRANSFERRIN, FERRITIN in the last 72 hours. Studies/Results: US  Abdomen Limited RUQ (LIVER/GB) Result Date: 05/24/2024 EXAM: Right Upper Quadrant Abdominal Ultrasound 05/24/2024 08:54:06 PM TECHNIQUE: Real-time ultrasonography of the right upper quadrant of the abdomen was performed. COMPARISON: CT abdomen and pelvis 05/24/2024. CLINICAL HISTORY: RUQ pain. FINDINGS: LIVER: Normal echogenicity. No intrahepatic biliary ductal dilatation. No evidence of mass. Hepatopetal flow in the portal vein. BILIARY SYSTEM: Gallbladder wall thickening and pericholecystic fluid. No definite evidence of calcified gallstones. Common bile duct is within normal limits measuring  mm. RIGHT KIDNEY: No hydronephrosis. No echogenic calculi. No mass. PANCREAS: Visualized portions of the pancreas are unremarkable. OTHER: Trace a simple free fluid ascites. IMPRESSION: 1. Acute acalculous cholecystitis. 2. Trace simple free fluid ascites. Electronically signed by: Morgane Naveau MD 05/24/2024 10:09 PM EST RP Workstation: [id number]   CT ABDOMEN PELVIS WO CONTRAST Result Date: 05/24/2024 EXAM: CT ABDOMEN AND PELVIS WITHOUT CONTRAST 05/24/2024 05:26:00 PM TECHNIQUE: CT of the abdomen and pelvis was performed without the administration of intravenous contrast. Multiplanar reformatted images are provided for review. Automated exposure control, iterative reconstruction, and/or weight-based adjustment of the mA/kV was utilized to reduce the radiation dose to as low as reasonably achievable. COMPARISON: None available. CLINICAL HISTORY: Abdominal pain, acute,  nonlocalized. FINDINGS: LOWER CHEST: Trace right pleural effusion. Cluster of peribronchovascular pulmonary nodules within the left medial lower lobe with nodules measuring up to 7 x 6 mm. LIVER: The liver is unremarkable. GALLBLADDER AND BILE DUCTS: Gallbladder wall thickening and pericholecystic fluid. No biliary ductal  dilatation. SPLEEN: No acute abnormality. PANCREAS: No acute abnormality. ADRENAL GLANDS: No acute abnormality. KIDNEYS, URETERS AND BLADDER: Bilateral kidneys are atrophic. Calcifications associated with the kidneys are likely vascular. No stones in the kidneys or ureters. No hydronephrosis. No perinephric or periureteral stranding. Urinary bladder is unremarkable. GI AND BOWEL: Stomach demonstrates no acute abnormality. No small or large bowel thickening or dilatation. The appendix is not definitely identified. PERITONEUM AND RETROPERITONEUM: Trace volume abdominal and pelvic simple free fluid ascites. No free air. VASCULATURE: Aorta is normal in caliber. Severe atherosclerotic plaque. LYMPH NODES: No lymphadenopathy. REPRODUCTIVE ORGANS: At least small volume right hydrocele. BONES AND SOFT TISSUES: Small fat-containing umbilical hernia. Trace shallow left inguinal hernia containing fat, free fluid ascites, and the antimesenteric wall of the sigmoid colon (3:66). No acute osseous abnormality. No focal soft tissue abnormality. IMPRESSION: 1. Poorly visualized, suggestion of gallbladder wall thickening and pericholecystic fluid. Recommend correlation with liver function tests. If clinically indicated, further evaluation with right upper quadrant ultrasound for acute cholecystitis. 2. Trace shallow left inguinal hernia containing fat, ascites, and the antimesenteric wall of the sigmoid colon. No findings to suggest incarceration or obstruction of the bowel. 3. Trace right pleural effusion. 4. 3 cm simple free fluid consistent with ascites. 5. Cluster of left lower lobe peribronchovascular pulmonary  nodules measuring up to 7 x 6 mm. Findings suggestive of infectious/inflammation. Follow-up noncontrast chest CT in 6-12 months recommended per Fleischner Society Guidelines (and if high-risk, repeat noncontrast chest CT at 18-24 months). 6. Severe atherosclerotic plaque. 7. Limited evaluation of this noncontrast study. Electronically signed by: Morgane Naveau MD 05/24/2024 07:06 PM EST RP Workstation: [id number]   DG Chest 2 View Result Date: 05/24/2024 EXAM: 2 VIEW(S) XRAY OF THE CHEST 05/24/2024 04:51:00 PM COMPARISON: 03/22/2024 CLINICAL HISTORY: cough FINDINGS: LUNGS AND PLEURA: No focal pulmonary opacity. No pleural effusion. No pneumothorax. HEART AND MEDIASTINUM: Heart size at upper limits of normal. CABG markers noted. Sternotomy wires noted. BONES AND SOFT TISSUES: No acute osseous abnormality. IMPRESSION: 1. No acute findings. 2. Heart size at upper limits of normal. Electronically signed by: Greig Pique MD 05/24/2024 05:52 PM EST RP Workstation: [id number]   Dialysis Orders:  Unit: [facility] Schedule: MWF Time: 3:45 EDW: 58.6 kg Flows: 450/800 Bath: 2K/2.5Ca  Access: AVG Heparin : None  ESA: Mircera 60 q 2 wks (last 12/17) VDRA: Hectorol  3 q HD   Assessment/Plan: Hyperkalemia 2/2 Missed/incomplete dialysis. Dialysis today to correct. Trend labs.  Abdominal pain. Gallbladder wall thickening on US . Surgery consulted. Plan for HIDA scan per notes.  ESRD.  HD MWF. HD off schedule today. Access.  L arm AVG  Hypertension. Uncontrolled HTN with questionable medication compliance. Resume home meds.  Volume. UF to dry weight  Anemia. Hgb 9.4>11.6. On ESA as outpatient. Follow trends  Metabolic bone disease.  Follow Ca/Phos trend. Resume home meds.  Nutrition. Renal diet with fluid restriction   Maisie Ronnald Acosta PA-C [facility] 05/25/2024, 8:28 AM           [1] No Known Allergies

## 2024-05-26 ENCOUNTER — Other Ambulatory Visit (HOSPITAL_COMMUNITY): Payer: Self-pay

## 2024-05-26 DIAGNOSIS — E875 Hyperkalemia: Secondary | ICD-10-CM | POA: Diagnosis not present

## 2024-05-26 LAB — RENAL FUNCTION PANEL
Albumin: 3.1 g/dL — ABNORMAL LOW (ref 3.5–5.0)
Anion gap: 14 (ref 5–15)
BUN: 50 mg/dL — ABNORMAL HIGH (ref 8–23)
CO2: 21 mmol/L — ABNORMAL LOW (ref 22–32)
Calcium: 7.9 mg/dL — ABNORMAL LOW (ref 8.9–10.3)
Chloride: 93 mmol/L — ABNORMAL LOW (ref 98–111)
Creatinine, Ser: 8.08 mg/dL — ABNORMAL HIGH (ref 0.61–1.24)
GFR, Estimated: 6 mL/min — ABNORMAL LOW
Glucose, Bld: 130 mg/dL — ABNORMAL HIGH (ref 70–99)
Phosphorus: 5.6 mg/dL — ABNORMAL HIGH (ref 2.5–4.6)
Potassium: 5.5 mmol/L — ABNORMAL HIGH (ref 3.5–5.1)
Sodium: 128 mmol/L — ABNORMAL LOW (ref 135–145)

## 2024-05-26 LAB — CBC WITH DIFFERENTIAL/PLATELET
Abs Immature Granulocytes: 0.03 K/uL (ref 0.00–0.07)
Basophils Absolute: 0.1 K/uL (ref 0.0–0.1)
Basophils Relative: 1 %
Eosinophils Absolute: 0.1 K/uL (ref 0.0–0.5)
Eosinophils Relative: 3 %
HCT: 27.3 % — ABNORMAL LOW (ref 39.0–52.0)
Hemoglobin: 8.8 g/dL — ABNORMAL LOW (ref 13.0–17.0)
Immature Granulocytes: 1 %
Lymphocytes Relative: 10 %
Lymphs Abs: 0.4 K/uL — ABNORMAL LOW (ref 0.7–4.0)
MCH: 31.4 pg (ref 26.0–34.0)
MCHC: 32.2 g/dL (ref 30.0–36.0)
MCV: 97.5 fL (ref 80.0–100.0)
Monocytes Absolute: 0.8 K/uL (ref 0.1–1.0)
Monocytes Relative: 19 %
Neutro Abs: 2.8 K/uL (ref 1.7–7.7)
Neutrophils Relative %: 66 %
Platelets: 196 K/uL (ref 150–400)
RBC: 2.8 MIL/uL — ABNORMAL LOW (ref 4.22–5.81)
RDW: 16.3 % — ABNORMAL HIGH (ref 11.5–15.5)
WBC: 4.2 K/uL (ref 4.0–10.5)
nRBC: 0 % (ref 0.0–0.2)

## 2024-05-26 LAB — BASIC METABOLIC PANEL WITH GFR
Anion gap: 14 (ref 5–15)
BUN: 51 mg/dL — ABNORMAL HIGH (ref 8–23)
CO2: 24 mmol/L (ref 22–32)
Calcium: 7.9 mg/dL — ABNORMAL LOW (ref 8.9–10.3)
Chloride: 93 mmol/L — ABNORMAL LOW (ref 98–111)
Creatinine, Ser: 8.55 mg/dL — ABNORMAL HIGH (ref 0.61–1.24)
GFR, Estimated: 6 mL/min — ABNORMAL LOW
Glucose, Bld: 95 mg/dL (ref 70–99)
Potassium: 4.9 mmol/L (ref 3.5–5.1)
Sodium: 131 mmol/L — ABNORMAL LOW (ref 135–145)

## 2024-05-26 LAB — GLUCOSE, CAPILLARY
Glucose-Capillary: 103 mg/dL — ABNORMAL HIGH (ref 70–99)
Glucose-Capillary: 150 mg/dL — ABNORMAL HIGH (ref 70–99)
Glucose-Capillary: 158 mg/dL — ABNORMAL HIGH (ref 70–99)

## 2024-05-26 LAB — RESP PANEL BY RT-PCR (RSV, FLU A&B, COVID)  RVPGX2
Influenza A by PCR: POSITIVE — AB
Influenza B by PCR: NEGATIVE
Resp Syncytial Virus by PCR: NEGATIVE
SARS Coronavirus 2 by RT PCR: NEGATIVE

## 2024-05-26 MED ORDER — SODIUM ZIRCONIUM CYCLOSILICATE 10 G PO PACK
10.0000 g | PACK | Freq: Two times a day (BID) | ORAL | Status: AC
  Administered 2024-05-26 (×2): 10 g via ORAL
  Filled 2024-05-26 (×2): qty 1

## 2024-05-26 MED ORDER — DOXERCALCIFEROL 4 MCG/2ML IV SOLN
3.0000 ug | INTRAVENOUS | Status: DC
  Administered 2024-05-27: 3 ug via INTRAVENOUS
  Filled 2024-05-26: qty 2

## 2024-05-26 MED ORDER — CHLORHEXIDINE GLUCONATE CLOTH 2 % EX PADS
6.0000 | MEDICATED_PAD | Freq: Every day | CUTANEOUS | Status: DC
  Administered 2024-05-27 – 2024-05-28 (×2): 6 via TOPICAL

## 2024-05-26 MED ORDER — DARBEPOETIN ALFA 100 MCG/0.5ML IJ SOSY
100.0000 ug | PREFILLED_SYRINGE | INTRAMUSCULAR | Status: AC
  Administered 2024-05-27: 100 ug via SUBCUTANEOUS
  Filled 2024-05-26: qty 0.5

## 2024-05-26 NOTE — Progress Notes (Addendum)
 " Progress Note   Patient: Shawn Collins FMW:[id number] DOB: [birth year] DOA: 05/24/2024     2 DOS: the patient was seen and examined on 05/26/2024   Brief hospital course: 73 y.o. male with medical history significant of ESRD on HD MWF, CAD status post CABG in 2018, COPD, CVA, type 2 diabetes, PAD, hypertension, hyperlipidemia, medication noncompliance.  Patient recently had clotted left forearm graft and was admitted by vascular surgery service and taken to the OR on 12/11 for open thrombectomy of the left forearm AV loop graft and revision of left forearm loop graft with interposition grafting from the venous limb to the basal leg vein.  Patient presents to the ED today via EMS for evaluation of generalized weakness and hypoglycemia.  Blood glucose was 53 with EMS and was given D10 with improvement of glucose to 117.  Patient AAO x 4.  Vital signs on arrival to the ED: Temperature 97.5 F, pulse 65, respiratory rate 16, blood pressure 238/89, and SpO2 95-96% on room air.  Labs notable for hemoglobin 10.3 (stable), sodium 133, chloride 93, bicarb 21, anion gap 18, glucose 66, AST 62 but remainder of LFTs normal, lipase normal, UA pending.  Potassium 6.3 on initial BMP but there was concern for possible hemolysis so i-STAT chemistry repeated which confirmed hyperkalemia with potassium of 6.0.  Chest x-ray showing heart size at upper limits of normal and no acute findings.  EKG showing normal sinus rhythm, slightly peaked T waves, and no acute ischemic changes.   Assessment and Plan: Hyperkalemia in the setting of missed hemodialysis ESRD on HD MWF Insulin  cannot be given due to hypoglycemia.  Patient was given albuterol , Lokelma , and calcium  gluconate in the ED.   Nephrology consulted and cont HD as per Nephro Hold home spironolactone  and losartan .   Acute acalculous cholecystitis noted on ultrasound No nausea or vomiting.  No abdominal pain or tenderness.  No fever, leukocytosis, or signs of sepsis.    General Surgery consulted. HIDA reviewed, unremarkable Plavix  was initially held. Resumed   Hypoglycemia Type 2 diabetes Hypoglycemic in the setting of very poor p.o. intake.   Last hemoglobin A1c 5.0 in October 2025.   Glycemic trends improved   Hypertensive urgency Due to medication noncompliance.  Patient was given a dose of his home clonidine  in the ED and SBP now improved to 180s.   Continue amlodipine , and hydralazine .  IV hydralazine  PRN SBP >160.   Held spironolactone  and losartan  in the setting of hyperkalemia. Clonidine  held given concerns of compliance   Depression Patient attributes his depression to his chronic vision problems/progressive vision loss for which he is seen by ophthalmology.  -Since he lives alone and is having difficulty taking care of himself, [facility] consulted for nursing home placement. PT/OT consulted   Left lower lobe peribronchovascular pulmonary nodules Noted on CT and radiologist feels could be infectious versus inflammatory in etiology.  Infection less likely as he is not endorsing any respiratory symptoms and has no fever or leukocytosis.  Patient will need outpatient follow-up for repeat noncontrast CT chest in 6 months for further evaluation.   Anemia of chronic disease Hemoglobin stable, monitor labs.   COPD Stable, no signs of acute exacerbation.  Does not use any inhalers at home.   CAD status post CABG: Not endorsing any anginal symptoms and EKG without acute ischemic changes. History of CVA PAD Hold aspirin  and Plavix  given finding of acute cholecystitis on ultrasound and pending evaluation by general surgery.  Continue Lipitor .  Fever -Temp of 103 noted earlier this AM - f/u on nasal swab for COVID, flu, RSV - Will check blood cx - Presenting CXR reviewed. Unremarkable    Subjective: Overall feeling better. Still reporting chronic vision issues for which pt states he is following ophthalmology   Physical Exam: Vitals:   05/26/24  0348 05/26/24 0732 05/26/24 1056 05/26/24 1237  BP: (!) 157/64 (!) 164/62 (!) 155/61 (!) 181/79  Pulse: 63 74 64 74  Resp: 20 19 19 17   Temp:  (!) 103 F (39.4 C) 99.2 F (37.3 C) 98.6 F (37 C)  TempSrc:  Oral Oral Oral  SpO2: 100% 99% 94% 98%  Weight:      Height:       General exam: Awake, laying in bed, in nad Respiratory system: Normal respiratory effort, no wheezing Cardiovascular system: regular rate, s1, s2 Gastrointestinal system: Soft, nondistended, positive BS Central nervous system: CN2-12 grossly intact, strength intact Extremities: Perfused, no clubbing Skin: Normal skin turgor, no notable skin lesions seen Psychiatry: Mood normal // no visual hallucinations   Data Reviewed:  Labs reviewed: Na 131, K 4.9, Cr 8.55, WBC 4.2, Hgb 8.8, Plts 196  Family Communication: Pt in room, family not at bedside  Disposition: Status is: Inpatient Remains inpatient appropriate because: Severity of illness  Planned Discharge Destination: Pending PT/OT eval     Author: Garnette Pelt, MD 05/26/2024 5:15 PM  For on call review [email or web address].  "

## 2024-05-26 NOTE — Progress Notes (Signed)
 [facility] NEPHROLOGY PROGRESS NOTE  Assessment/ Plan: Pt is a 73 y.o. yo male  with ESRD on HD, HTN, DM, CAD s/p CABG, prior CVA who presented to the ED with weakness and hypoglycemia.  Dialysis Orders:  Unit: [facility] Schedule: MWF Time: 3:45 EDW: 58.6 kg Flows: 450/800 Bath: 2K/2.5Ca  Access: AVG Heparin : None  ESA: Mircera 60 q 2 wks (last 12/17) VDRA: Hectorol  3 q HD  # Hyperkalemia due to missed/incomplete outpatient dialysis.  Improved with HD.  # ESRD: Plan for regular dialysis tomorrow.  AV graft for the access.  # Anemia: Start weekly Aranesp , monitor hemoglobin.  # Secondary hyperparathyroidism/hyperphosphatemia: Currently on Renvela .  Resume Hectorol .  # HTN/volume: Monitor BP.  On amlodipine , losartan .  Volume status acceptable.  # Abdominal pain, fever: HIDA scan normal.  Seen by general surgery.  On ceftriaxone .  # Disposition: Patient said he lives alone and has impaired vision.  Likely discharge to SNF.  Subjective: Seen and examined at the bedside.  Denies nausea, vomiting, chest pain, shortness of breath.  No abdominal pain this morning. Objective Vital signs in last 24 hours: Vitals:   05/25/24 2341 05/26/24 0348 05/26/24 0732 05/26/24 1056  BP: (!) 137/56 (!) 157/64 (!) 164/62 (!) 155/61  Pulse: 61 63 74 64  Resp: 20 20 19 19   Temp: 98.9 F (37.2 C)  (!) 103 F (39.4 C) 99.2 F (37.3 C)  TempSrc: Oral  Oral Oral  SpO2: 100% 100% 99% 94%  Weight:      Height:       Weight change: -4.9 kg  Intake/Output Summary (Last 24 hours) at 05/26/2024 1151 Last data filed at 05/25/2024 2321 Gross per 24 hour  Intake 720 ml  Output --  Net 720 ml       Labs: RENAL PANEL Recent Labs  Lab 05/20/24 2022 05/24/24 2109 05/24/24 2137 05/25/24 0242 05/26/24 0322 05/26/24 1026  NA 134* 133* 131* 131* 128* 131*  K 4.9 6.3* 6.0* 5.5* 5.5* 4.9  CL 95* 93* 99 92* 93* 93*  CO2 25 21*  --  17* 21* 24  GLUCOSE 101* 66* 63* 107* 130* 95   BUN 40* 76* 85* 77* 50* 51*  CREATININE 6.60* 10.80* 12.10* 11.00* 8.08* 8.55*  CALCIUM  9.5 8.9  --  8.8* 7.9* 7.9*  PHOS  --   --   --   --  5.6*  --   ALBUMIN   --  4.0  --  3.8 3.1*  --     Liver Function Tests: Recent Labs  Lab 05/24/24 2109 05/25/24 0242 05/26/24 0322  AST 62* 67*  --   ALT 26 24  --   ALKPHOS 94 83  --   BILITOT 0.5 0.4  --   PROT 7.4 7.0  --   ALBUMIN  4.0 3.8 3.1*   Recent Labs  Lab 05/24/24 2109  LIPASE 12   No results for input(s): AMMONIA in the last 168 hours. CBC: Recent Labs    07/12/23 0630 07/13/23 0808 05/20/24 2022 05/24/24 1603 05/24/24 2137 05/25/24 0242 05/26/24 0322  HGB  --    < > 10.0* 10.3* 11.6* 9.4* 8.8*  MCV  --    < > 104.4* 99.4  --  100.0 97.5  FERRITIN 821*  --   --   --   --   --   --   TIBC 181*  --   --   --   --   --   --  IRON 22*  --   --   --   --   --   --    < > = values in this interval not displayed.    Cardiac Enzymes: No results for input(s): CKTOTAL, CKMB, CKMBINDEX, TROPONINI in the last 168 hours. CBG: Recent Labs  Lab 05/25/24 0046 05/25/24 0413 05/25/24 1530 05/25/24 1958 05/26/24 0018  GLUCAP 132* 103* 71 69* 158*    Iron Studies: No results for input(s): IRON, TIBC, TRANSFERRIN, FERRITIN in the last 72 hours. Studies/Results: NM Hepatobiliary Liver Func Result Date: 05/25/2024 CLINICAL DATA:  Abdominal pain. EXAM: NUCLEAR MEDICINE HEPATOBILIARY IMAGING TECHNIQUE: Sequential images of the abdomen were obtained out to 60 minutes following intravenous administration of radiopharmaceutical. RADIOPHARMACEUTICALS:  5.2 mCi Tc-34m  Choletec  IV COMPARISON:  None Available. FINDINGS: Prompt uptake and biliary excretion of activity by the liver is seen. Gallbladder activity is visualized, consistent with patency of cystic duct. Gallbladder has a vertically oriented appearance, similar to CT scan from yesterday. Biliary activity passes into small bowel, consistent with patent  common bile duct. IMPRESSION: Normal hepatobiliary patency study. Electronically Signed   By: Camellia Candle M.D.   On: 05/25/2024 14:01   US  Abdomen Limited RUQ (LIVER/GB) Result Date: 05/24/2024 EXAM: Right Upper Quadrant Abdominal Ultrasound 05/24/2024 08:54:06 PM TECHNIQUE: Real-time ultrasonography of the right upper quadrant of the abdomen was performed. COMPARISON: CT abdomen and pelvis 05/24/2024. CLINICAL HISTORY: RUQ pain. FINDINGS: LIVER: Normal echogenicity. No intrahepatic biliary ductal dilatation. No evidence of mass. Hepatopetal flow in the portal vein. BILIARY SYSTEM: Gallbladder wall thickening and pericholecystic fluid. No definite evidence of calcified gallstones. Common bile duct is within normal limits measuring  mm. RIGHT KIDNEY: No hydronephrosis. No echogenic calculi. No mass. PANCREAS: Visualized portions of the pancreas are unremarkable. OTHER: Trace a simple free fluid ascites. IMPRESSION: 1. Acute acalculous cholecystitis. 2. Trace simple free fluid ascites. Electronically signed by: Morgane Naveau MD 05/24/2024 10:09 PM EST RP Workstation: [id number]   CT ABDOMEN PELVIS WO CONTRAST Result Date: 05/24/2024 EXAM: CT ABDOMEN AND PELVIS WITHOUT CONTRAST 05/24/2024 05:26:00 PM TECHNIQUE: CT of the abdomen and pelvis was performed without the administration of intravenous contrast. Multiplanar reformatted images are provided for review. Automated exposure control, iterative reconstruction, and/or weight-based adjustment of the mA/kV was utilized to reduce the radiation dose to as low as reasonably achievable. COMPARISON: None available. CLINICAL HISTORY: Abdominal pain, acute, nonlocalized. FINDINGS: LOWER CHEST: Trace right pleural effusion. Cluster of peribronchovascular pulmonary nodules within the left medial lower lobe with nodules measuring up to 7 x 6 mm. LIVER: The liver is unremarkable. GALLBLADDER AND BILE DUCTS: Gallbladder wall thickening and pericholecystic fluid. No  biliary ductal dilatation. SPLEEN: No acute abnormality. PANCREAS: No acute abnormality. ADRENAL GLANDS: No acute abnormality. KIDNEYS, URETERS AND BLADDER: Bilateral kidneys are atrophic. Calcifications associated with the kidneys are likely vascular. No stones in the kidneys or ureters. No hydronephrosis. No perinephric or periureteral stranding. Urinary bladder is unremarkable. GI AND BOWEL: Stomach demonstrates no acute abnormality. No small or large bowel thickening or dilatation. The appendix is not definitely identified. PERITONEUM AND RETROPERITONEUM: Trace volume abdominal and pelvic simple free fluid ascites. No free air. VASCULATURE: Aorta is normal in caliber. Severe atherosclerotic plaque. LYMPH NODES: No lymphadenopathy. REPRODUCTIVE ORGANS: At least small volume right hydrocele. BONES AND SOFT TISSUES: Small fat-containing umbilical hernia. Trace shallow left inguinal hernia containing fat, free fluid ascites, and the antimesenteric wall of the sigmoid colon (3:66). No acute osseous abnormality. No focal soft tissue abnormality. IMPRESSION:  1. Poorly visualized, suggestion of gallbladder wall thickening and pericholecystic fluid. Recommend correlation with liver function tests. If clinically indicated, further evaluation with right upper quadrant ultrasound for acute cholecystitis. 2. Trace shallow left inguinal hernia containing fat, ascites, and the antimesenteric wall of the sigmoid colon. No findings to suggest incarceration or obstruction of the bowel. 3. Trace right pleural effusion. 4. 3 cm simple free fluid consistent with ascites. 5. Cluster of left lower lobe peribronchovascular pulmonary nodules measuring up to 7 x 6 mm. Findings suggestive of infectious/inflammation. Follow-up noncontrast chest CT in 6-12 months recommended per Fleischner Society Guidelines (and if high-risk, repeat noncontrast chest CT at 18-24 months). 6. Severe atherosclerotic plaque. 7. Limited evaluation of this  noncontrast study. Electronically signed by: Morgane Naveau MD 05/24/2024 07:06 PM EST RP Workstation: [id number]   DG Chest 2 View Result Date: 05/24/2024 EXAM: 2 VIEW(S) XRAY OF THE CHEST 05/24/2024 04:51:00 PM COMPARISON: 03/22/2024 CLINICAL HISTORY: cough FINDINGS: LUNGS AND PLEURA: No focal pulmonary opacity. No pleural effusion. No pneumothorax. HEART AND MEDIASTINUM: Heart size at upper limits of normal. CABG markers noted. Sternotomy wires noted. BONES AND SOFT TISSUES: No acute osseous abnormality. IMPRESSION: 1. No acute findings. 2. Heart size at upper limits of normal. Electronically signed by: Greig Pique MD 05/24/2024 05:52 PM EST RP Workstation: [id number]    Medications: Infusions:  cefTRIAXone  (ROCEPHIN )  IV 2 g (05/26/24 0802)   metronidazole  500 mg (05/26/24 0601)    Scheduled Medications:  amLODipine   10 mg Oral QHS   atorvastatin   40 mg Oral QHS   Chlorhexidine  Gluconate Cloth  6 each Topical Q0600   clopidogrel   75 mg Oral Daily   hydrALAZINE   50 mg Oral Q8H   losartan   100 mg Oral Daily   sevelamer  carbonate  1,600 mg Oral TID WC   sodium zirconium cyclosilicate   10 g Oral BID    have reviewed scheduled and prn medications.  Physical Exam: General: Elderly frail male lying on bed Heart:RRR, s1s2 nl Lungs:clear b/l, no crackle Abdomen:soft, Non-tender, non-distended Extremities:No edema Dialysis Access: AV graft.  Shawn Collins Prasad Demaya Hardge 05/26/2024,11:51 AM  LOS: 2 days

## 2024-05-26 NOTE — Hospital Course (Signed)
 73 y.o. male with medical history significant of ESRD on HD MWF, CAD status post CABG in 2018, COPD, CVA, type 2 diabetes, PAD, hypertension, hyperlipidemia, medication noncompliance.  Patient recently had clotted left forearm graft and was admitted by vascular surgery service and taken to the OR on 12/11 for open thrombectomy of the left forearm AV loop graft and revision of left forearm loop graft with interposition grafting from the venous limb to the basal leg vein.  Patient presents to the ED today via EMS for evaluation of generalized weakness and hypoglycemia.  Blood glucose was 53 with EMS and was given D10 with improvement of glucose to 117.  Patient AAO x 4.  Vital signs on arrival to the ED: Temperature 97.5 F, pulse 65, respiratory rate 16, blood pressure 238/89, and SpO2 95-96% on room air.  Labs notable for hemoglobin 10.3 (stable), sodium 133, chloride 93, bicarb 21, anion gap 18, glucose 66, AST 62 but remainder of LFTs normal, lipase normal, UA pending.  Potassium 6.3 on initial BMP but there was concern for possible hemolysis so i-STAT chemistry repeated which confirmed hyperkalemia with potassium of 6.0.  Chest x-ray showing heart size at upper limits of normal and no acute findings.  EKG showing normal sinus rhythm, slightly peaked T waves, and no acute ischemic changes.

## 2024-05-27 DIAGNOSIS — K819 Cholecystitis, unspecified: Secondary | ICD-10-CM

## 2024-05-27 DIAGNOSIS — E875 Hyperkalemia: Secondary | ICD-10-CM | POA: Diagnosis not present

## 2024-05-27 LAB — COMPREHENSIVE METABOLIC PANEL WITH GFR
ALT: 18 U/L (ref 0–44)
AST: 61 U/L — ABNORMAL HIGH (ref 15–41)
Albumin: 3.3 g/dL — ABNORMAL LOW (ref 3.5–5.0)
Alkaline Phosphatase: 62 U/L (ref 38–126)
Anion gap: 16 — ABNORMAL HIGH (ref 5–15)
BUN: 58 mg/dL — ABNORMAL HIGH (ref 8–23)
CO2: 23 mmol/L (ref 22–32)
Calcium: 8.4 mg/dL — ABNORMAL LOW (ref 8.9–10.3)
Chloride: 92 mmol/L — ABNORMAL LOW (ref 98–111)
Creatinine, Ser: 9.75 mg/dL — ABNORMAL HIGH (ref 0.61–1.24)
GFR, Estimated: 5 mL/min — ABNORMAL LOW
Glucose, Bld: 67 mg/dL — ABNORMAL LOW (ref 70–99)
Potassium: 4.2 mmol/L (ref 3.5–5.1)
Sodium: 130 mmol/L — ABNORMAL LOW (ref 135–145)
Total Bilirubin: 0.3 mg/dL (ref 0.0–1.2)
Total Protein: 5.8 g/dL — ABNORMAL LOW (ref 6.5–8.1)

## 2024-05-27 LAB — RENAL FUNCTION PANEL
Albumin: 3.6 g/dL (ref 3.5–5.0)
Anion gap: 14 (ref 5–15)
BUN: 25 mg/dL — ABNORMAL HIGH (ref 8–23)
CO2: 26 mmol/L (ref 22–32)
Calcium: 8.5 mg/dL — ABNORMAL LOW (ref 8.9–10.3)
Chloride: 94 mmol/L — ABNORMAL LOW (ref 98–111)
Creatinine, Ser: 4.82 mg/dL — ABNORMAL HIGH (ref 0.61–1.24)
GFR, Estimated: 12 mL/min — ABNORMAL LOW
Glucose, Bld: 47 mg/dL — ABNORMAL LOW (ref 70–99)
Phosphorus: 3.2 mg/dL (ref 2.5–4.6)
Potassium: 3.5 mmol/L (ref 3.5–5.1)
Sodium: 134 mmol/L — ABNORMAL LOW (ref 135–145)

## 2024-05-27 LAB — CBC
HCT: 27.3 % — ABNORMAL LOW (ref 39.0–52.0)
HCT: 31.5 % — ABNORMAL LOW (ref 39.0–52.0)
Hemoglobin: 8.9 g/dL — ABNORMAL LOW (ref 13.0–17.0)
Hemoglobin: 10.4 g/dL — ABNORMAL LOW (ref 13.0–17.0)
MCH: 31.2 pg (ref 26.0–34.0)
MCH: 31.1 pg (ref 26.0–34.0)
MCHC: 32.6 g/dL (ref 30.0–36.0)
MCHC: 33 g/dL (ref 30.0–36.0)
MCV: 95.8 fL (ref 80.0–100.0)
MCV: 94.3 fL (ref 80.0–100.0)
Platelets: 195 K/uL (ref 150–400)
Platelets: 214 K/uL (ref 150–400)
RBC: 2.85 MIL/uL — ABNORMAL LOW (ref 4.22–5.81)
RBC: 3.34 MIL/uL — ABNORMAL LOW (ref 4.22–5.81)
RDW: 15.9 % — ABNORMAL HIGH (ref 11.5–15.5)
RDW: 15.7 % — ABNORMAL HIGH (ref 11.5–15.5)
WBC: 4.6 K/uL (ref 4.0–10.5)
WBC: 3.6 K/uL — ABNORMAL LOW (ref 4.0–10.5)
nRBC: 0 % (ref 0.0–0.2)
nRBC: 0 % (ref 0.0–0.2)

## 2024-05-27 LAB — HEPATITIS B SURFACE ANTIBODY, QUANTITATIVE: Hep B S AB Quant (Post): 23.5 m[IU]/mL

## 2024-05-27 MED ORDER — HYDRALAZINE HCL 20 MG/ML IJ SOLN
10.0000 mg | Freq: Four times a day (QID) | INTRAMUSCULAR | Status: DC | PRN
  Administered 2024-05-27 – 2024-06-02 (×11): 10 mg via INTRAVENOUS
  Filled 2024-05-27 (×12): qty 1

## 2024-05-27 MED ORDER — HEPARIN SODIUM (PORCINE) 1000 UNIT/ML DIALYSIS: 20.0000 [IU]/kg | INTRAMUSCULAR | Status: DC | PRN

## 2024-05-27 MED ORDER — HEPARIN SODIUM (PORCINE) 1000 UNIT/ML DIALYSIS: 1000.0000 [IU] | INTRAMUSCULAR | Status: DC | PRN

## 2024-05-27 MED ORDER — OSELTAMIVIR PHOSPHATE 30 MG PO CAPS
30.0000 mg | ORAL_CAPSULE | ORAL | Status: DC
  Filled 2024-05-27: qty 1

## 2024-05-27 MED ORDER — ACETAMINOPHEN 325 MG PO TABS
ORAL_TABLET | ORAL | Status: AC
  Filled 2024-05-27: qty 2

## 2024-05-27 MED ORDER — DOXERCALCIFEROL 4 MCG/2ML IV SOLN
INTRAVENOUS | Status: AC
  Filled 2024-05-27: qty 2

## 2024-05-27 MED ORDER — OSELTAMIVIR PHOSPHATE 30 MG PO CAPS
30.0000 mg | ORAL_CAPSULE | ORAL | Status: AC
  Administered 2024-05-27: 30 mg via ORAL
  Filled 2024-05-27: qty 1

## 2024-05-27 MED ORDER — HYDRALAZINE HCL 50 MG PO TABS
75.0000 mg | ORAL_TABLET | Freq: Three times a day (TID) | ORAL | Status: DC
  Administered 2024-05-27 – 2024-05-28 (×3): 75 mg via ORAL
  Filled 2024-05-27 (×3): qty 1

## 2024-05-27 MED ORDER — PENTAFLUOROPROP-TETRAFLUOROETH EX AERO: 1.0000 | INHALATION_SPRAY | CUTANEOUS | Status: DC | PRN

## 2024-05-27 MED ORDER — LIDOCAINE-PRILOCAINE 2.5-2.5 % EX CREA: 1.0000 | TOPICAL_CREAM | CUTANEOUS | Status: DC | PRN

## 2024-05-27 MED ORDER — HYDROMORPHONE HCL 1 MG/ML IJ SOLN
0.5000 mg | Freq: Once | INTRAMUSCULAR | Status: AC | PRN
  Administered 2024-05-27: 0.5 mg via INTRAVENOUS
  Filled 2024-05-27: qty 0.5

## 2024-05-27 MED ORDER — ALTEPLASE 2 MG IJ SOLR: 2.0000 mg | Freq: Once | INTRAMUSCULAR | Status: DC | PRN

## 2024-05-27 MED ORDER — ANTICOAGULANT SODIUM CITRATE 4% (200MG/5ML) IV SOLN: 5.0000 mL | Status: DC | PRN

## 2024-05-27 MED ORDER — LIDOCAINE HCL (PF) 1 % IJ SOLN: 5.0000 mL | INTRAMUSCULAR | Status: DC | PRN

## 2024-05-27 NOTE — Progress Notes (Signed)
 " Progress Note   Patient: Shawn Collins FMW:[id number] DOB: [birth year] DOA: 05/24/2024     3 DOS: the patient was seen and examined on 05/27/2024   Brief hospital course: 73 y.o. male with medical history significant of ESRD on HD MWF, CAD status post CABG in 2018, COPD, CVA, type 2 diabetes, PAD, hypertension, hyperlipidemia, medication noncompliance.  Patient recently had clotted left forearm graft and was admitted by vascular surgery service and taken to the OR on 12/11 for open thrombectomy of the left forearm AV loop graft and revision of left forearm loop graft with interposition grafting from the venous limb to the basal leg vein.  Patient presents to the ED today via EMS for evaluation of generalized weakness and hypoglycemia.  Blood glucose was 53 with EMS and was given D10 with improvement of glucose to 117.  Patient AAO x 4.  Vital signs on arrival to the ED: Temperature 97.5 F, pulse 65, respiratory rate 16, blood pressure 238/89, and SpO2 95-96% on room air.  Labs notable for hemoglobin 10.3 (stable), sodium 133, chloride 93, bicarb 21, anion gap 18, glucose 66, AST 62 but remainder of LFTs normal, lipase normal, UA pending.  Potassium 6.3 on initial BMP but there was concern for possible hemolysis so i-STAT chemistry repeated which confirmed hyperkalemia with potassium of 6.0.  Chest x-ray showing heart size at upper limits of normal and no acute findings.  EKG showing normal sinus rhythm, slightly peaked T waves, and no acute ischemic changes.   Assessment and Plan: Hyperkalemia in the setting of missed hemodialysis ESRD on HD MWF Insulin  cannot be given due to hypoglycemia.  Patient was given albuterol , Lokelma , and calcium  gluconate in the ED.   Nephrology consulted and cont HD as per Nephro Held home spironolactone  and losartan .   Acute acalculous cholecystitis noted on ultrasound No nausea or vomiting.  No abdominal pain or tenderness.  No fever, leukocytosis, or signs of sepsis.    General Surgery consulted. HIDA reviewed, unremarkable. Surgery signed off Plavix  was initially held. Resumed   Hypoglycemia Type 2 diabetes Hypoglycemic in the setting of very poor p.o. intake.   Last hemoglobin A1c 5.0 in October 2025.   Glycemic trends improved   Hypertensive urgency Due to medication noncompliance.  Continue amlodipine , and hydralazine .  IV hydralazine  PRN SBP >160.   Held spironolactone  and losartan  in the setting of hyperkalemia. SBP remains in the 180-190's. Increased hydralazine  to 75mg  TID   Depression Patient attributes his depression to his chronic vision problems/progressive vision loss for which he is seen by ophthalmology.  -Since he lives alone and is having difficulty taking care of himself, [facility] consulted for nursing home placement. PT/OT consulted   Left lower lobe peribronchovascular pulmonary nodules Noted on CT and radiologist feels could be infectious versus inflammatory in etiology.  Infection less likely as he is not endorsing any respiratory symptoms and has no fever or leukocytosis.  Patient will need outpatient follow-up for repeat noncontrast CT chest in 6 months for further evaluation.   Anemia of chronic disease Hemoglobin stable, monitor labs.   COPD Stable, no signs of acute exacerbation.  Does not use any inhalers at home.   CAD status post CABG: Not endorsing any anginal symptoms and EKG without acute ischemic changes. History of CVA PAD Hold aspirin  and Plavix  given finding of acute cholecystitis on ultrasound and pending evaluation by general surgery.  Continue Lipitor .   Fever secondary to flu A -Temp of 103 noted on the AM of  12/28 - nasal swab pos for flu A - Pt reporting increased malaise this AM - Started tamiflu  12/29     Subjective: Reporting generalized malaise this AM when seen on HD  Physical Exam: Vitals:   05/27/24 1208 05/27/24 1216 05/27/24 1300 05/27/24 1530  BP: (!) 180/64 (!) (P) 189/60 (!) 194/66 (!)  177/61  Pulse: (!) 57 (P) 61 64 70  Resp: 14 (P) 19 15 16   Temp:  (P) 98.1 F (36.7 C) 97.7 F (36.5 C) 97.9 F (36.6 C)  TempSrc:   Oral Oral  SpO2: 100% (P) 100% 100% 100%  Weight:      Height:       General exam: Conversant, in no acute distress Respiratory system: normal chest rise, clear, no audible wheezing Cardiovascular system: regular rhythm, s1-s2 Gastrointestinal system: Nondistended, nontender, pos BS Central nervous system: No seizures, no tremors Extremities: No cyanosis, no joint deformities Skin: No rashes, no pallor Psychiatry: Affect normal // mood seems normal  Data Reviewed:  Labs reviewed: Na 134, K 3.5, Cr 4.82, WBC 3.6, Hgb 10.4, Plts 214  Family Communication: Pt in room, family not at bedside  Disposition: Status is: Inpatient Remains inpatient appropriate because: Severity of illness  Planned Discharge Destination: Skilled nursing facility    Author: Garnette Pelt, MD 05/27/2024 3:43 PM  For on call review [email or web address].  "

## 2024-05-27 NOTE — Progress Notes (Addendum)
 Pt receives out-pt HD at [facility], MWF, 1105 chair time. Will continue to assist as needed.   Out-pt HD [facility] clinic called with concerns regarding pt safety at home. Clinic is requesting someone speak with pt about going to SNF. Have informed care team including nephrology, attending, RN, etc. Will continue to assist as needed.   Shawn Collins Dialysis Navigator [phone]

## 2024-05-27 NOTE — Progress Notes (Incomplete)
 SABRA

## 2024-05-27 NOTE — Progress Notes (Addendum)
 [facility] NEPHROLOGY PROGRESS NOTE  Assessment/ Plan: Pt is a 73 y.o. yo male  with ESRD on HD, HTN, DM, CAD s/p CABG, prior CVA who presented to the ED with weakness and hypoglycemia.   Dialysis Orders:  Unit: [facility] Schedule: MWF Time: 3:45 EDW: 58.6 kg Flows: 450/800 Bath: 2K/2.5Ca  Access: AVG Heparin : None  ESA: Mircera 60 q 2 wks (last 12/17) VDRA: Hectorol  3 q HD   # Hyperkalemia due to missed/incomplete outpatient dialysis.   - Improved with HD.  # ESRD:  - HD per MWF schedule   # Anemia:  - just started on weekly Aranesp  100 mcg every Monday  - monitor hemoglobin  # Secondary hyperparathyroidism/hyperphosphatemia: Currently on Renvela  and Hectorol .  # HTN/volume: - optimize volume with HD   # Abdominal pain, fever: HIDA scan normal.  Seen by general surgery.  On ceftriaxone .  Abx per primary team   # Disposition: Patient said he lives alone and has impaired vision.  Likely discharge to SNF.  Disposition is pending PT/oT eval per charting     Subjective:  Seen and examined on dialysis.  Blood pressure 171/66 and HR 62.  AVG in use.  Tolerating goal.  He states that he doesn't think that he'll be able to go home.   Reviews of system:   Denies shortness of breath or chest pain  No n/v; some abdominal pain    Objective Vital signs in last 24 hours: Vitals:   05/27/24 0831 05/27/24 0834 05/27/24 0900 05/27/24 0930  BP: (!) 155/62 (!) 153/57 (!) 172/69 (!) 151/76  Pulse: (!) 58 (!) 56 61 67  Resp: 14 13 13 16   Temp:      TempSrc:      SpO2: 94% 100% 100% 100%  Weight: 59.2 kg     Height:       Weight change:   Intake/Output Summary (Last 24 hours) at 05/27/2024 0947 Last data filed at 05/27/2024 9687 Gross per 24 hour  Intake 410 ml  Output 200 ml  Net 210 ml       Labs: RENAL PANEL Recent Labs  Lab 05/24/24 2109 05/24/24 2137 05/25/24 0242 05/26/24 0322 05/26/24 1026 05/27/24 0319  NA 133* 131* 131* 128* 131* 130*   K 6.3* 6.0* 5.5* 5.5* 4.9 4.2  CL 93* 99 92* 93* 93* 92*  CO2 21*  --  17* 21* 24 23  GLUCOSE 66* 63* 107* 130* 95 67*  BUN 76* 85* 77* 50* 51* 58*  CREATININE 10.80* 12.10* 11.00* 8.08* 8.55* 9.75*  CALCIUM  8.9  --  8.8* 7.9* 7.9* 8.4*  PHOS  --   --   --  5.6*  --   --   ALBUMIN  4.0  --  3.8 3.1*  --  3.3*    Liver Function Tests: Recent Labs  Lab 05/24/24 2109 05/25/24 0242 05/26/24 0322 05/27/24 0319  AST 62* 67*  --  61*  ALT 26 24  --  18  ALKPHOS 94 83  --  62  BILITOT 0.5 0.4  --  0.3  PROT 7.4 7.0  --  5.8*  ALBUMIN  4.0 3.8 3.1* 3.3*   Recent Labs  Lab 05/24/24 2109  LIPASE 12   No results for input(s): AMMONIA in the last 168 hours. CBC: Recent Labs    07/12/23 0630 07/13/23 0808 05/24/24 1603 05/24/24 2137 05/25/24 0242 05/26/24 0322 05/27/24 0319  HGB  --    < > 10.3* 11.6* 9.4* 8.8* 8.9*  MCV  --    < >  99.4  --  100.0 97.5 95.8  FERRITIN 821*  --   --   --   --   --   --   TIBC 181*  --   --   --   --   --   --   IRON 22*  --   --   --   --   --   --    < > = values in this interval not displayed.    Cardiac Enzymes: No results for input(s): CKTOTAL, CKMB, CKMBINDEX, TROPONINI in the last 168 hours. CBG: Recent Labs  Lab 05/25/24 1530 05/25/24 1958 05/26/24 0018 05/26/24 1512 05/26/24 1717  GLUCAP 71 69* 158* 150* 103*    Iron Studies: No results for input(s): IRON, TIBC, TRANSFERRIN, FERRITIN in the last 72 hours. Studies/Results: NM Hepatobiliary Liver Func Result Date: 05/25/2024 CLINICAL DATA:  Abdominal pain. EXAM: NUCLEAR MEDICINE HEPATOBILIARY IMAGING TECHNIQUE: Sequential images of the abdomen were obtained out to 60 minutes following intravenous administration of radiopharmaceutical. RADIOPHARMACEUTICALS:  5.2 mCi Tc-70m  Choletec  IV COMPARISON:  None Available. FINDINGS: Prompt uptake and biliary excretion of activity by the liver is seen. Gallbladder activity is visualized, consistent with patency of cystic  duct. Gallbladder has a vertically oriented appearance, similar to CT scan from yesterday. Biliary activity passes into small bowel, consistent with patent common bile duct. IMPRESSION: Normal hepatobiliary patency study. Electronically Signed   By: Camellia Candle M.D.   On: 05/25/2024 14:01    Medications: Infusions:  anticoagulant sodium citrate      cefTRIAXone  (ROCEPHIN )  IV Stopped (05/26/24 0835)   metronidazole  500 mg (05/27/24 0602)    Scheduled Medications:  amLODipine   10 mg Oral QHS   atorvastatin   40 mg Oral QHS   Chlorhexidine  Gluconate Cloth  6 each Topical Q0600   clopidogrel   75 mg Oral Daily   darbepoetin (ARANESP ) injection - DIALYSIS  100 mcg Subcutaneous Q Mon-1800   doxercalciferol   3 mcg Intravenous Q M,W,F-HD   hydrALAZINE   50 mg Oral Q8H   losartan   100 mg Oral Daily   sevelamer  carbonate  1,600 mg Oral TID WC    have reviewed scheduled and prn medications.  Physical Exam:   General adult male in bed in no acute distress HEENT normocephalic atraumatic extraocular movements intact sclera anicteric Neck supple trachea midline Lungs clear to auscultation bilaterally normal work of breathing at rest  Heart S1S2 no rub  Abdomen soft nontender nondistended Extremities no edema appreciated Psych normal mood and affect Neuro conversant and answers questions  Access LUE AVG in use   Katheryn BROCKS Larren Copes 05/27/2024,10:03 AM  LOS: 3 days     Addendum - spoke with HD SW and outpatient nephrologist.  Per prior patterns, his outpatient team strongly recommends a SNF which sounds like may need to be a permanent residence for him.  He has had recurrent issues with self care after discharge from SNF in the past per his outpatient team.  He cannot manage at home  Katheryn BROCKS Saba, MD 11:38 AM 05/27/2024

## 2024-05-27 NOTE — Progress Notes (Signed)
 OT Cancellation Note  Patient Details Name: Yidel Teuscher MRN: [id number] DOB: 03-Jan-1951   Cancelled Treatment:    Reason Eval/Treat Not Completed: Patient at procedure or test/ unavailable. Pt off unit at HD, OT will follow up next available time  Jacques Karna Loose 05/27/2024, 9:48 AM

## 2024-05-27 NOTE — Progress Notes (Signed)
 Report called to Valarie RN in dialysis.

## 2024-05-27 NOTE — Evaluation (Signed)
 Occupational Therapy Evaluation Patient Details Name: Shawn Collins MRN: [id number] DOB: 1950/06/27 Today's Date: 05/27/2024   History of Present Illness   Shawn Collins is a 73 y.o. male with medical history significant of ESRD on HD MWF, CAD status post CABG in 2018, COPD, CVA, type 2 diabetes, PAD, hypertension, hyperlipidemia, medication noncompliance.  Patient recently had clotted left forearm graft and was admitted by vascular surgery service and taken to the OR on 12/11 for open thrombectomy of the left forearm AV loop graft and revision of left forearm loop graft with interposition grafting from the venous limb to the basal leg vein.  Patient presented to the ED via EMS for evaluation of generalized weakness and hypoglycemia.     Clinical Impressions Pt presents with decline in function and safety with ADLs and ADL mobility with impaired strength, balance and endurance. Pt reports that PTA he lives at a boarding house and was Ind with ADLs, states he can drive but his car isn't working right now and he has visual impairments, but that he has someone that picks him up occassionaly to go to dialysis or he will catch the bus. Pt uses a cane for mobility, reports 3 falls on past 3 months. Pt currently requires min A to sit EOB, CGA with UB ADLs/grooming tasks, max A with Lb ADLs, max A with toileting tasks and mod/min A STS/SPTs with RW. OT will follow cutely to maximize level of function and safety     If plan is discharge home, recommend the following:   A lot of help with bathing/dressing/bathroom;A lot of help with walking and/or transfers;Direct supervision/assist for financial management;Assist for transportation;Help with stairs or ramp for entrance;Direct supervision/assist for medications management     Functional Status Assessment   Patient has had a recent decline in their functional status and demonstrates the ability to make significant improvements in function in a  reasonable and predictable amount of time.     Equipment Recommendations   Other (comment) (defer)     Recommendations for Other Services         Precautions/Restrictions   Precautions Precautions: Fall Recall of Precautions/Restrictions: Intact Precaution/Restrictions Comments: low vision L eye, very low vision R eye Restrictions Weight Bearing Restrictions Per Provider Order: No     Mobility Bed Mobility Overal bed mobility: Needs Assistance Bed Mobility: Supine to Sit, Sit to Supine     Supine to sit: Min assist, HOB elevated, Used rails Sit to supine: Min assist   General bed mobility comments: min A to elevate trunk and with LEs back onto bed, able to scoot hips to EOB with increased time    Transfers Overall transfer level: Needs assistance Equipment used: Rolling walker (2 wheels), Straight cane, None Transfers: Sit to/from Stand, Bed to chair/wheelchair/BSC Sit to Stand: Mod assist, Min assist Stand pivot transfers: Min assist                Balance Overall balance assessment: Needs assistance Sitting-balance support: No upper extremity supported, Feet supported Sitting balance-Leahy Scale: Good     Standing balance support: Single extremity supported, Reliant on assistive device for balance, During functional activity Standing balance-Leahy Scale: Poor                             ADL either performed or assessed with clinical judgement   ADL Overall ADL's : Needs assistance/impaired Eating/Feeding: Set up;Independent;Sitting   Grooming: Wash/dry hands;Wash/dry face;Contact guard assist;Sitting  Upper Body Bathing: Contact guard assist;Sitting   Lower Body Bathing: Maximal assistance   Upper Body Dressing : Contact guard assist;Sitting   Lower Body Dressing: Maximal assistance   Toilet Transfer: Moderate assistance;Minimal assistance;Rolling walker (2 wheels);Stand-pivot;Cueing for safety   Toileting- Clothing  Manipulation and Hygiene: Maximal assistance;Sit to/from stand       Functional mobility during ADLs: Moderate assistance;Minimal assistance;Rolling walker (2 wheels);Cueing for safety General ADL Comments: pt limited by impaired activity tolerance and weakness     Vision Baseline Vision/History: 1 Wears glasses Ability to See in Adequate Light: 0 Adequate Patient Visual Report: No change from baseline       Perception         Praxis         Pertinent Vitals/Pain Pain Assessment Pain Assessment: No/denies pain     Extremity/Trunk Assessment Upper Extremity Assessment Upper Extremity Assessment: Generalized weakness;Right hand dominant   Lower Extremity Assessment Lower Extremity Assessment: Defer to PT evaluation       Communication Communication Communication: No apparent difficulties Factors Affecting Communication: Reduced clarity of speech   Cognition Arousal: Alert Behavior During Therapy: WFL for tasks assessed/performed                                 Following commands: Intact       Cueing  General Comments   Cueing Techniques: Verbal cues      Exercises     Shoulder Instructions      Home Living Family/patient expects to be discharged to:: Private residence Living Arrangements: Other (Comment) (boarding house) Available Help at Discharge: Friend(s);Available PRN/intermittently Type of Home: Apartment Home Access: Stairs to enter;Ramped entrance Entrance Stairs-Number of Steps: 3 Entrance Stairs-Rails: Right Home Layout: Two level Alternate Level Stairs-Number of Steps: flight of stairs to second floor to get to shower   Bathroom Shower/Tub: Producer, Television/film/video: Standard     Home Equipment: [place] - single point   Additional Comments: States he can drive but his car isn't working right now and he has visual impairments.  He states he has someone that picks him up occassionaly to go to dialysis or he will  catch the bus.      Prior Functioning/Environment Prior Level of Function : Needs assist             Mobility Comments: ambulates with SPC, reports 3 falls in the last 3 months ADLs Comments: Ind with ADLs/selfcare    OT Problem List: Decreased strength;Decreased knowledge of use of DME or AE;Decreased activity tolerance;Impaired balance (sitting and/or standing)   OT Treatment/Interventions: Self-care/ADL training;Therapeutic exercise;Patient/family education;Balance training;Therapeutic activities;DME and/or AE instruction      OT Goals(Current goals can be found in the care plan section)   Acute Rehab OT Goals Patient Stated Goal: get stronger OT Goal Formulation: With patient Time For Goal Achievement: 06/10/24 Potential to Achieve Goals: Good ADL Goals Pt Will Perform Grooming: with supervision;sitting Pt Will Perform Upper Body Bathing: with supervision;sitting Pt Will Perform Lower Body Bathing: with mod assist;sitting/lateral leans;sit to/from stand Pt Will Perform Upper Body Dressing: with supervision;sitting Pt Will Transfer to Toilet: with min assist;with contact guard assist Pt Will Perform Toileting - Clothing Manipulation and hygiene: with mod assist;with min assist;sit to/from stand   OT Frequency:  Min 2X/week    Co-evaluation              AM-PAC OT 6 Clicks Daily  Activity     Outcome Measure Help from another person eating meals?: None Help from another person taking care of personal grooming?: A Little Help from another person toileting, which includes using toliet, bedpan, or urinal?: A Lot Help from another person bathing (including washing, rinsing, drying)?: A Lot Help from another person to put on and taking off regular upper body clothing?: A Little Help from another person to put on and taking off regular lower body clothing?: A Lot 6 Click Score: 16   End of Session Equipment Utilized During Treatment: Gait belt;Rolling walker (2  wheels) Nurse Communication: Mobility status  Activity Tolerance: Patient limited by fatigue Patient left: in bed;with call bell/phone within reach;with bed alarm set  OT Visit Diagnosis: Unsteadiness on feet (R26.81);Other abnormalities of gait and mobility (R26.89);Muscle weakness (generalized) (M62.81);History of falling (Z91.81)                Time: 8592-8561 OT Time Calculation (min): 31 min Charges:  OT General Charges $OT Visit: 1 Visit OT Evaluation $OT Eval Moderate Complexity: 1 Mod OT Treatments $Therapeutic Activity: 8-22 mins    Jacques Karna Loose 05/27/2024, 2:52 PM

## 2024-05-27 NOTE — Procedures (Signed)
 HD Note:  Some information was entered later than the data was gathered due to patient care needs. The stated time with the data is accurate.  Received patient in bed to unit.   Alert and oriented.   Informed consent signed and in chart.   Access used: upper left arm fistula Access issues: None  Patient tolerated treatment well.   TX duration:  Alert, without acute distress.  Total UF removed: 1500 ml  Hand-off given to patient's nurse.   Transported back to the room   Charmain Diosdado L. Lenon, RN Kidney Dialysis Unit.

## 2024-05-27 NOTE — Progress Notes (Signed)
 PT Cancellation Note  Patient Details Name: Shawn Collins MRN: [id number] DOB: [birth year]   Cancelled Treatment:    Reason Eval/Treat Not Completed: (P) Patient at procedure or test/unavailable. Will plan to follow-up as time permits.   Theo Ferretti, PT, DPT Acute Rehabilitation Services  Office: [phone]    Theo CHRISTELLA Ferretti 05/27/2024, 8:30 AM

## 2024-05-27 NOTE — Progress Notes (Signed)
 Patient off to dialysis, CCMD notified.

## 2024-05-27 NOTE — Plan of Care (Signed)

## 2024-05-28 DIAGNOSIS — K819 Cholecystitis, unspecified: Secondary | ICD-10-CM | POA: Diagnosis not present

## 2024-05-28 DIAGNOSIS — E875 Hyperkalemia: Secondary | ICD-10-CM | POA: Diagnosis not present

## 2024-05-28 LAB — GLUCOSE, CAPILLARY
Glucose-Capillary: 62 mg/dL — ABNORMAL LOW (ref 70–99)
Glucose-Capillary: 79 mg/dL (ref 70–99)
Glucose-Capillary: 54 mg/dL — ABNORMAL LOW (ref 70–99)
Glucose-Capillary: 130 mg/dL — ABNORMAL HIGH (ref 70–99)
Glucose-Capillary: 74 mg/dL (ref 70–99)

## 2024-05-28 MED ORDER — HYDRALAZINE HCL 50 MG PO TABS
100.0000 mg | ORAL_TABLET | Freq: Three times a day (TID) | ORAL | Status: DC
  Administered 2024-05-28 – 2024-06-01 (×8): 100 mg via ORAL
  Filled 2024-05-28 (×11): qty 2

## 2024-05-28 MED ORDER — CHLORHEXIDINE GLUCONATE CLOTH 2 % EX PADS
6.0000 | MEDICATED_PAD | Freq: Every day | CUTANEOUS | Status: DC
  Administered 2024-05-29: 6 via TOPICAL

## 2024-05-28 MED ORDER — OXYCODONE HCL 5 MG PO TABS
5.0000 mg | ORAL_TABLET | ORAL | Status: DC | PRN
  Administered 2024-05-28: 5 mg via ORAL
  Filled 2024-05-28: qty 1

## 2024-05-28 MED ORDER — ONDANSETRON HCL 4 MG/2ML IJ SOLN
4.0000 mg | Freq: Three times a day (TID) | INTRAMUSCULAR | Status: DC | PRN
  Administered 2024-05-28: 4 mg via INTRAVENOUS
  Filled 2024-05-28: qty 2

## 2024-05-28 MED ORDER — ALUM & MAG HYDROXIDE-SIMETH 200-200-20 MG/5ML PO SUSP
30.0000 mL | ORAL | Status: DC | PRN
  Filled 2024-05-28: qty 30

## 2024-05-28 MED ORDER — INSULIN ASPART 100 UNIT/ML IJ SOLN
0.0000 [IU] | INTRAMUSCULAR | Status: DC
  Administered 2024-05-29: 1 [IU] via SUBCUTANEOUS
  Filled 2024-05-28: qty 1

## 2024-05-28 NOTE — NC FL2 (Addendum)
 " [place]  MEDICAID FL2 LEVEL OF CARE FORM     IDENTIFICATION  Patient Name: Shawn Collins Birthdate: December 14, 1950 Sex: male Admission Date (Current Location): 05/24/2024  [facility] and [place] Number:  Producer, Television/film/video and Address:  [facility] [place], [address], [place], [place] [address]      Provider Number: [id number]  Attending Physician Name and Address:  Cindy Garnette POUR, MD  Relative Name and Phone Number:       Current Level of Care: Hospital Recommended Level of Care: Skilled Nursing Facility Prior Approval Number:    Date Approved/Denied:   PASRR Number: [id number]  Discharge Plan: SNF    Current Diagnoses: Patient Active Problem List   Diagnosis Date Noted   Acute acalculous cholecystitis 05/25/2024   Hypertensive emergency 03/23/2024   Cognitive impairment 03/22/2024   Dry gangrene (HCC) 03/22/2024   HTN (hypertension) 09/13/2023   COVID-19 07/10/2023   Hyperkalemia 07/10/2023   Metabolic acidosis with increased anion gap and accumulation of organic acids 07/10/2023   Hypertensive urgency 07/10/2023   Hypoglycemia 07/10/2023   Weakness 07/10/2023   Syncope 07/05/2022   Syncope and collapse 07/04/2022   Type 2 diabetes mellitus (HCC) 07/04/2022   Anemia of chronic disease 07/04/2022   Acute metabolic encephalopathy due to hypoglycemia 12/30/2021   Hypothermia 12/30/2021   Uncontrolled type 2 diabetes mellitus with hypoglycemia, with long-term current use of insulin  (HCC) 12/30/2021   History of CVA (cerebrovascular accident) 12/30/2021   DNR (do not resuscitate) 12/30/2021   Stroke determined by clinical assessment (HCC) 12/18/2021   ESRD (end stage renal disease) on dialysis (HCC) 02/23/2021   DOE (dyspnea on exertion) 11/22/2017   Hyperlipidemia LDL goal <70 04/17/2017   CAD (coronary artery disease)    S/P CABG x 4 10/26/2016   Acute kidney injury superimposed on CKD    Abnormal nuclear stress test    Nausea & vomiting  10/16/2016   NSTEMI (non-ST elevated myocardial infarction) (HCC) 10/16/2016   CKD (chronic kidney disease), stage III (HCC) 10/16/2016   COPD exacerbation (HCC) 09/10/2014   COPD (chronic obstructive pulmonary disease) (HCC) 09/10/2014   Viral gastroenteritis 09/10/2014   Hypoxia 09/10/2014   Dizzy-->?Orthostatic? 09/10/2014   Diabetes mellitus without complication (HCC) 09/10/2014   Hypertension 09/10/2014   Emphysema lung (HCC) 09/10/2014    Orientation RESPIRATION BLADDER Height & Weight     Self, Situation, Time, Place  Normal Continent Weight: 130 lb 8.2 oz (59.2 kg) Height:  5' 10 (177.8 cm)  BEHAVIORAL SYMPTOMS/MOOD NEUROLOGICAL BOWEL NUTRITION STATUS      Continent Diet (see d/c summary)  AMBULATORY STATUS COMMUNICATION OF NEEDS Skin   Extensive Assist Verbally Other (Comment) (incision left arm)                       Personal Care Assistance Level of Assistance  Bathing, Feeding, Dressing Bathing Assistance: Limited assistance Feeding assistance: Independent Dressing Assistance: Limited assistance     Functional Limitations Info  Sight, Hearing, Speech Sight Info: Impaired Hearing Info: Impaired Speech Info: Adequate    SPECIAL CARE FACTORS FREQUENCY  PT (By licensed PT), OT (By licensed OT)     PT Frequency: 5x/week OT Frequency: 5x/week            Contractures Contractures Info: Not present    Additional Factors Info  Code Status, Allergies Code Status Info: DNR Allergies Info: no known allergies           Current Medications (05/28/2024):  This is the current hospital active medication list Current Facility-Administered Medications  Medication Dose Route Frequency Provider Last Rate Last Admin   acetaminophen  (TYLENOL ) tablet 650 mg  650 mg Oral Q6H PRN Cindy Garnette POUR, MD   650 mg at 05/27/24 0935   Or   acetaminophen  (TYLENOL ) suppository 650 mg  650 mg Rectal Q6H PRN Cindy Garnette POUR, MD       alteplase  (CATHFLO ACTIVASE ) injection  2 mg  2 mg Intracatheter Once PRN Chiu, Stephen K, MD       alum & mag hydroxide-simeth (MAALOX/MYLANTA) 200-200-20 MG/5ML suspension 30 mL  30 mL Oral Q4H PRN Opyd, Timothy S, MD       amLODipine  (NORVASC ) tablet 10 mg  10 mg Oral QHS Cindy Garnette POUR, MD   10 mg at 05/27/24 2123   anticoagulant sodium citrate  solution 5 mL  5 mL Intracatheter PRN Cindy Garnette POUR, MD       atorvastatin  (LIPITOR ) tablet 40 mg  40 mg Oral QHS Cindy Garnette POUR, MD   40 mg at 05/27/24 2123   cefTRIAXone  (ROCEPHIN ) 2 g in sodium chloride  0.9 % 100 mL IVPB  2 g Intravenous Daily Cindy Garnette POUR, MD 200 mL/hr at 05/28/24 0853 2 g at 05/28/24 0853   Chlorhexidine  Gluconate Cloth 2 % PADS 6 each  6 each Topical Q0600 Cindy Garnette POUR, MD   6 each at 05/28/24 9490   clopidogrel  (PLAVIX ) tablet 75 mg  75 mg Oral Daily Cindy Garnette POUR, MD   75 mg at 05/28/24 9145   Darbepoetin Alfa  (ARANESP ) injection 100 mcg  100 mcg Subcutaneous Q Mon-1800 Cindy Garnette POUR, MD   100 mcg at 05/27/24 1839   dextrose  50 % solution 12.5 g  12.5 g Intravenous PRN Cindy Garnette POUR, MD       doxercalciferol  (HECTOROL ) injection 3 mcg  3 mcg Intravenous Q M,W,F-HD Cindy Garnette POUR, MD   3 mcg at 05/27/24 9062   heparin  injection 1,000 Units  1,000 Units Intracatheter PRN Cindy Garnette POUR, MD       heparin  injection 1,100 Units  20 Units/kg Dialysis PRN Cindy Garnette POUR, MD       hydrALAZINE  (APRESOLINE ) injection 10 mg  10 mg Intravenous Q6H PRN Cindy Garnette POUR, MD   10 mg at 05/28/24 9476   hydrALAZINE  (APRESOLINE ) tablet 75 mg  75 mg Oral Q8H Cindy Garnette POUR, MD   75 mg at 05/28/24 9476   insulin  aspart (novoLOG ) injection 0-6 Units  0-6 Units Subcutaneous Q4H Cindy Garnette POUR, MD       lidocaine  (PF) (XYLOCAINE ) 1 % injection 5 mL  5 mL Intradermal PRN Cindy Garnette POUR, MD       lidocaine -prilocaine  (EMLA ) cream 1 Application  1 Application Topical PRN Cindy Garnette POUR, MD       losartan  (COZAAR ) tablet 100 mg  100 mg Oral Daily Cindy Garnette POUR, MD   100  mg at 05/28/24 9145   metroNIDAZOLE  (FLAGYL ) IVPB 500 mg  500 mg Intravenous Q12H Cindy Garnette POUR, MD   Stopped at 05/28/24 9376   naloxone  (NARCAN ) injection 0.4 mg  0.4 mg Intravenous PRN Cindy Garnette POUR, MD       ondansetron  (ZOFRAN ) injection 4 mg  4 mg Intravenous Q8H PRN Cindy Garnette POUR, MD   4 mg at 05/28/24 1128   [START ON 05/29/2024] oseltamivir  (TAMIFLU ) capsule 30 mg  30 mg Oral Q M,W,F-HD Cindy Garnette POUR, MD  oxyCODONE  (Oxy IR/ROXICODONE ) immediate release tablet 5 mg  5 mg Oral Q4H PRN Opyd, Timothy S, MD   5 mg at 05/28/24 0024   pentafluoroprop-tetrafluoroeth (GEBAUERS) aerosol 1 Application  1 Application Topical PRN Cindy Garnette POUR, MD       sevelamer  carbonate (RENVELA ) tablet 1,600 mg  1,600 mg Oral TID WC Cindy Garnette POUR, MD   1,600 mg at 05/27/24 1836   sevelamer  carbonate (RENVELA ) tablet 800 mg  800 mg Oral PRN Cindy Garnette POUR, MD   800 mg at 05/25/24 9680     Discharge Medications: Please see discharge summary for a list of discharge medications.  Relevant Imaging Results:  Relevant Lab Results:   Additional Information SSN: [id number]; out-pt HD at [facility] on MWF 11:05 am chair time.  Maurio Baize SHAUNNA Cumming, LCSW     "

## 2024-05-28 NOTE — Inpatient Diabetes Management (Signed)
 Inpatient Diabetes Program Recommendations  AACE/ADA: New Consensus Statement on Inpatient Glycemic Control (2015)  Target Ranges:  Prepandial:   less than 140 mg/dL      Peak postprandial:   less than 180 mg/dL (1-2 hours)      Critically ill patients:  140 - 180 mg/dL   Lab Results  Component Value Date   GLUCAP 103 (H) 05/26/2024   HGBA1C 5.0 03/22/2024    Review of Glycemic Control  Latest Reference Range & Units 05/25/24 15:30 05/25/24 19:58 05/26/24 00:18 05/26/24 15:12 05/26/24 17:17  Glucose-Capillary 70 - 99 mg/dL 71 69 (L) 841 (H) 849 (H) 103 (H)  (L): Data is abnormally low (H): Data is abnormally high Diabetes history: no DM noted Outpatient Diabetes medications: none Current orders for Inpatient glycemic control: none  Inpatient Diabetes Program Recommendations:    Serum glucose yesterday of 47 mg/dL. No recheck was completed.  Consider adding CBGs TID & HS.  Secure chat sent to MD and RN to check CBG.  Thanks, Tinnie Minus, MSN, RNC-OB Diabetes Coordinator [phone] (8a-5p)

## 2024-05-28 NOTE — Evaluation (Signed)
 Physical Therapy Evaluation Patient Details Name: Shawn Collins MRN: [id number] DOB: 1950/10/21 Today's Date: 05/28/2024  History of Present Illness  73 y.o. male presents to [facility] hospital on 05/24/2024 for weakness and hypoglycemia. Pt with trips to ED in previous week and reports he can not take care of himself at home and make it to HD consistently.  Found to have hyperkalemia, in the setting of missed HD and acute acalculous cholecystitis EFY:ZDMI on HD MWF, CAD status post CABG in 2018, COPD, CVA, type 2 diabetes, PAD, hypertension, hyperlipidemia, medication noncompliance.  Patient recently had clotted left forearm graft and was admitted by vascular surgery service and taken to the OR on 12/11 for open thrombectomy of the left forearm AV loop graft and revision of left forearm loop graft with interposition grafting from the venous limb to the basal leg vein.  Clinical Impression  Pt has had multiple trips to the ED in the last month and has difficulty with getting to HD consistently and managing his medications. Pt with limited vision and related poor depth perception making it difficult to manage RW around obstacles in a novel environment requiring up to [facility] for navigation. Pt is min A for bed mobility and min A for transfers and modA for ambulation with RW. Patient will benefit from continued inpatient follow up therapy, <3 hours/day. PT will continue to follow acutely and will refer to Mobility Specialist.        If plan is discharge home, recommend the following: A little help with walking and/or transfers;A little help with bathing/dressing/bathroom;Assistance with cooking/housework;Direct supervision/assist for medications management;Direct supervision/assist for financial management;Help with stairs or ramp for entrance   Can travel by private vehicle   Yes    Equipment Recommendations None recommended by PT  Recommendations for Other Services       Functional Status Assessment  Patient has had a recent decline in their functional status and demonstrates the ability to make significant improvements in function in a reasonable and predictable amount of time.     Precautions / Restrictions Precautions Precautions: Fall Recall of Precautions/Restrictions: Intact Precaution/Restrictions Comments: low vision L eye, very low vision R eye Restrictions Weight Bearing Restrictions Per Provider Order: No      Mobility  Bed Mobility Overal bed mobility: Needs Assistance Bed Mobility: Supine to Sit     Supine to sit: Min assist, HOB elevated, Used rails     General bed mobility comments: light minA for bringing trunk to upright and scooting hips to EoB.    Transfers Overall transfer level: Needs assistance Equipment used: Rolling walker (2 wheels), Straight cane, None Transfers: Sit to/from Stand Sit to Stand: Min assist           General transfer comment: power up with both hands on RW despite, cues to push up from bed and starting with hands on bed, min A for steadying in standing    Ambulation/Gait Ambulation/Gait assistance: Min assist, Mod assist Gait Distance (Feet): 25 Feet Assistive device: Rolling walker (2 wheels), Straight cane Gait Pattern/deviations: Step-through pattern, Staggering left, Staggering right Gait velocity: reduced Gait velocity interpretation: <1.31 ft/sec, indicative of household ambulator   General Gait Details: min A for straight line ambulation, but modA for navigation around obstacles in room.     Balance Overall balance assessment: Needs assistance Sitting-balance support: No upper extremity supported, Feet supported Sitting balance-Leahy Scale: Good     Standing balance support: Single extremity supported, Reliant on assistive device for balance, During functional activity Standing  balance-Leahy Scale: Poor                               Pertinent Vitals/Pain Pain Assessment Pain Assessment:  No/denies pain    Home Living Family/patient expects to be discharged to:: Private residence Living Arrangements: Other (Comment) (boarding house) Available Help at Discharge: Friend(s);Available PRN/intermittently Type of Home: Apartment Home Access: Stairs to enter;Ramped entrance Entrance Stairs-Rails: Right Entrance Stairs-Number of Steps: 3 Alternate Level Stairs-Number of Steps: flight of stairs to second floor to get to shower Home Layout: Two level Home Equipment: Cane - single point Additional Comments: States he can drive but his car isn't working right now and he has visual impairments.  He states he has someone that picks him up occassionaly to go to dialysis or he will catch the bus.    Prior Function Prior Level of Function : Needs assist             Mobility Comments: ambulates with SPC, reports 3 falls in the last 3 months ADLs Comments: Ind with ADLs/selfcare     Extremity/Trunk Assessment   Upper Extremity Assessment Upper Extremity Assessment: Defer to OT evaluation    Lower Extremity Assessment Lower Extremity Assessment: Generalized weakness    Cervical / Trunk Assessment Cervical / Trunk Assessment: Kyphotic  Communication   Communication Communication: No apparent difficulties Factors Affecting Communication: Reduced clarity of speech    Cognition Arousal: Lethargic Behavior During Therapy: WFL for tasks assessed/performed   PT - Cognitive impairments: Problem solving                       PT - Cognition Comments: slowed processing Following commands: Intact       Cueing Cueing Techniques: Verbal cues     General Comments General comments (skin integrity, edema, etc.): BP 166/64 HR 64, on RA        Assessment/Plan    PT Assessment Patient needs continued PT services  PT Problem List Decreased balance;Decreased mobility;Decreased knowledge of use of DME;Decreased safety awareness       PT Treatment Interventions DME  instruction;Gait training;Stair training;Functional mobility training;Therapeutic activities;Therapeutic exercise;Balance training;Neuromuscular re-education;Patient/family education    PT Goals (Current goals can be found in the Care Plan section)  Acute Rehab PT Goals PT Goal Formulation: With patient Time For Goal Achievement: 06/11/24 Potential to Achieve Goals: Fair    Frequency Min 2X/week        AM-PAC PT 6 Clicks Mobility  Outcome Measure Help needed turning from your back to your side while in a flat bed without using bedrails?: None Help needed moving from lying on your back to sitting on the side of a flat bed without using bedrails?: A Little Help needed moving to and from a bed to a chair (including a wheelchair)?: A Little Help needed standing up from a chair using your arms (e.g., wheelchair or bedside chair)?: A Little Help needed to walk in hospital room?: A Lot Help needed climbing 3-5 steps with a railing? : A Lot 6 Click Score: 17    End of Session Equipment Utilized During Treatment: Gait belt Activity Tolerance: Patient tolerated treatment well Patient left: in chair;with chair alarm set Nurse Communication: Mobility status PT Visit Diagnosis: Other abnormalities of gait and mobility (R26.89);Other symptoms and signs involving the nervous system (M70.101)    Time: 9065-9041 PT Time Calculation (min) (ACUTE ONLY): 24 min   Charges:  PT Evaluation $PT Eval Low Complexity: 1 Low PT Treatments $Gait Training: 8-22 mins PT General Charges $$ ACUTE PT VISIT: 1 Visit         Rihanna Marseille B. Fleeta Lapidus PT, DPT Acute Rehabilitation Services Please use secure chat or  Call Office [phone]   Almarie KATHEE Fleeta Fleet 05/28/2024, 10:22 AM

## 2024-05-28 NOTE — Progress Notes (Signed)
 Mobility Specialist Progress Note;   05/28/24 1133  Mobility  Activity Ambulated with assistance;Stood at bedside;Respositioned in chair  Level of Assistance Minimal assist, patient does 75% or more  Assistive Device Front wheel walker  Distance Ambulated (ft) 8 ft  Activity Response Tolerated well  Mobility Referral Yes  Mobility visit 1 Mobility  Mobility Specialist Start Time (ACUTE ONLY) 1133  Mobility Specialist Stop Time (ACUTE ONLY) 1143  Mobility Specialist Time Calculation (min) (ACUTE ONLY) 10 min   Pt about to slide out from char, needing assistance repositioning. Required MinA to stand from chair. Pt able to perform standing marches and ambulate a few feet forward and backward w/ Min/Mod assist. VC required for instruction and safety. VSS throughout. Pt returned back to chair and left with all needs met, alarm on.   Lauraine Erm Mobility Specialist Please contact via SecureChat or [employer] [phone]

## 2024-05-28 NOTE — Progress Notes (Signed)
 [facility] NEPHROLOGY PROGRESS NOTE  Assessment/ Plan: Pt is a 73 y.o. yo male  with ESRD on HD, HTN, DM, CAD s/p CABG, prior CVA who presented to the ED with weakness and hypoglycemia.   Dialysis Orders:  Unit: [facility] Schedule: MWF Time: 3:45 EDW: 58.6 kg Flows: 450/800 Bath: 2K/2.5Ca  Access: AVG Heparin : None  ESA: Mircera 60 q 2 wks (last 12/17) VDRA: Hectorol  3 q HD   # Hyperkalemia due to missed/incomplete outpatient dialysis as well as home meds losartan  and spironolactone  - Improved with HD and tolerating current regimen  - would remain off of spironolactone    # ESRD:  - HD per MWF schedule - next tx on 12/31  # Anemia of CKD:  - on weekly Aranesp  100 mcg every Monday - may be able to titrate down over time - monitor hemoglobin  # Secondary hyperparathyroidism/hyperphosphatemia:  - Continue on Renvela  and Hectorol .  # HTN/volume: - optimize volume with HD  - off of spironolactone  which was reported as a home med - increase hydralazine  to 100 mg TID - he is reported as not taking clonidine  and if not compliant would not resume - see that this is off  # Abdominal pain, fever: HIDA scan normal.  evaluated by general surgery.  On ceftriaxone .  Abx per primary team   # Disposition: Patient said he lives alone and has impaired vision.  Spoke with HD SW and outpatient nephrologist.  Per prior patterns, his outpatient team strongly recommends a SNF which sounds like may need to be a permanent residence for him.  He has had recurrent issues with self care after discharge from SNF in the past per his outpatient team.  He cannot manage well at home   Subjective:   He had 100 mL UOP over 12/29.  He had HD on 12/29 with 1.5 kg UF.  His blood glucose is low and NA has prepared crackers and peanut butter and juice for him.  He states that they are working on getting him a SNF.    Reviews of system:     Denies shortness of breath or chest pain  No n/v; some  abdominal pain    Objective Vital signs in last 24 hours: Vitals:   05/28/24 0500 05/28/24 0856 05/28/24 1138 05/28/24 1443  BP: (!) 185/66 (!) 183/57 (!) 157/64 (!) 176/68  Pulse: 72 75 70 68  Resp: (!) 22 16 19    Temp: 98.1 F (36.7 C) 98 F (36.7 C) 97.7 F (36.5 C)   TempSrc: Oral Oral Oral   SpO2: 99% 100%    Weight:      Height:       Weight change:   Intake/Output Summary (Last 24 hours) at 05/28/2024 1654 Last data filed at 05/28/2024 0624 Gross per 24 hour  Intake 566.67 ml  Output 100 ml  Net 466.67 ml       Labs: RENAL PANEL Recent Labs  Lab 05/24/24 2109 05/24/24 2137 05/25/24 0242 05/26/24 0322 05/26/24 1026 05/27/24 0319 05/27/24 1324  NA 133*   < > 131* 128* 131* 130* 134*  K 6.3*   < > 5.5* 5.5* 4.9 4.2 3.5  CL 93*   < > 92* 93* 93* 92* 94*  CO2 21*  --  17* 21* 24 23 26   GLUCOSE 66*   < > 107* 130* 95 67* 47*  BUN 76*   < > 77* 50* 51* 58* 25*  CREATININE 10.80*   < > 11.00* 8.08*  8.55* 9.75* 4.82*  CALCIUM  8.9  --  8.8* 7.9* 7.9* 8.4* 8.5*  PHOS  --   --   --  5.6*  --   --  3.2  ALBUMIN  4.0  --  3.8 3.1*  --  3.3* 3.6   < > = values in this interval not displayed.    Liver Function Tests: Recent Labs  Lab 05/24/24 2109 05/25/24 0242 05/26/24 0322 05/27/24 0319 05/27/24 1324  AST 62* 67*  --  61*  --   ALT 26 24  --  18  --   ALKPHOS 94 83  --  62  --   BILITOT 0.5 0.4  --  0.3  --   PROT 7.4 7.0  --  5.8*  --   ALBUMIN  4.0 3.8 3.1* 3.3* 3.6   Recent Labs  Lab 05/24/24 2109  LIPASE 12   No results for input(s): AMMONIA in the last 168 hours. CBC: Recent Labs    07/12/23 0630 07/13/23 0808 05/24/24 2137 05/25/24 0242 05/26/24 0322 05/27/24 0319 05/27/24 1324  HGB  --    < > 11.6* 9.4* 8.8* 8.9* 10.4*  MCV  --    < >  --  100.0 97.5 95.8 94.3  FERRITIN 821*  --   --   --   --   --   --   TIBC 181*  --   --   --   --   --   --   IRON 22*  --   --   --   --   --   --    < > = values in this interval not  displayed.    Cardiac Enzymes: No results for input(s): CKTOTAL, CKMB, CKMBINDEX, TROPONINI in the last 168 hours. CBG: Recent Labs  Lab 05/26/24 0018 05/26/24 1512 05/26/24 1717 05/28/24 1016 05/28/24 1135  GLUCAP 158* 150* 103* 62* 79    Iron Studies: No results for input(s): IRON, TIBC, TRANSFERRIN, FERRITIN in the last 72 hours. Studies/Results: No results found.   Medications: Infusions:  anticoagulant sodium citrate      cefTRIAXone  (ROCEPHIN )  IV 2 g (05/28/24 0853)   metronidazole  Stopped (05/28/24 9376)    Scheduled Medications:  amLODipine   10 mg Oral QHS   atorvastatin   40 mg Oral QHS   Chlorhexidine  Gluconate Cloth  6 each Topical Q0600   clopidogrel   75 mg Oral Daily   darbepoetin (ARANESP ) injection - DIALYSIS  100 mcg Subcutaneous Q Mon-1800   doxercalciferol   3 mcg Intravenous Q M,W,F-HD   hydrALAZINE   75 mg Oral Q8H   insulin  aspart  0-6 Units Subcutaneous Q4H   losartan   100 mg Oral Daily   [START ON 05/29/2024] oseltamivir   30 mg Oral Q M,W,F-HD   sevelamer  carbonate  1,600 mg Oral TID WC    have reviewed scheduled and prn medications.  Physical Exam:     General adult male in bed in no acute distress HEENT normocephalic atraumatic extraocular movements intact sclera anicteric Neck supple trachea midline Lungs clear to auscultation bilaterally normal work of breathing at rest  Heart S1S2 no rub  Abdomen soft nontender nondistended Extremities no edema appreciated Psych normal mood and affect Neuro conversant and answers questions  Access LUE AVG with bruit  Shawn Collins 05/28/2024,5:17 PM  LOS: 4 days

## 2024-05-28 NOTE — Progress Notes (Signed)
 " Progress Note   Patient: Shawn Collins FMW:[id number] DOB: 06-06-1950 DOA: 05/24/2024     4 DOS: the patient was seen and examined on 05/28/2024   Brief hospital course: 73 y.o. male with medical history significant of ESRD on HD MWF, CAD status post CABG in 2018, COPD, CVA, type 2 diabetes, PAD, hypertension, hyperlipidemia, medication noncompliance.  Patient recently had clotted left forearm graft and was admitted by vascular surgery service and taken to the OR on 12/11 for open thrombectomy of the left forearm AV loop graft and revision of left forearm loop graft with interposition grafting from the venous limb to the basal leg vein.  Patient presents to the ED today via EMS for evaluation of generalized weakness and hypoglycemia.  Blood glucose was 53 with EMS and was given D10 with improvement of glucose to 117.  Patient AAO x 4.  Vital signs on arrival to the ED: Temperature 97.5 F, pulse 65, respiratory rate 16, blood pressure 238/89, and SpO2 95-96% on room air.  Labs notable for hemoglobin 10.3 (stable), sodium 133, chloride 93, bicarb 21, anion gap 18, glucose 66, AST 62 but remainder of LFTs normal, lipase normal, UA pending.  Potassium 6.3 on initial BMP but there was concern for possible hemolysis so i-STAT chemistry repeated which confirmed hyperkalemia with potassium of 6.0.  Chest x-ray showing heart size at upper limits of normal and no acute findings.  EKG showing normal sinus rhythm, slightly peaked T waves, and no acute ischemic changes.   Assessment and Plan: Hyperkalemia in the setting of missed hemodialysis ESRD on HD MWF Insulin  cannot be given due to hypoglycemia.  Patient was given albuterol , Lokelma , and calcium  gluconate in the ED.   Nephrology consulted and cont HD as per Nephro Held home spironolactone  and losartan . Potassium normalized   Acute acalculous cholecystitis noted on ultrasound No nausea or vomiting.  No abdominal pain or tenderness.  No fever,  leukocytosis, or signs of sepsis.   General Surgery consulted. HIDA reviewed, unremarkable. Surgery signed off Plavix  was initially held. Resumed   Hypoglycemia Type 2 diabetes Hypoglycemic in the setting of very poor p.o. intake.   Last hemoglobin A1c 5.0 in October 2025.   Glycemic trends improved   Hypertensive urgency Due to medication noncompliance.  Continue amlodipine , and hydralazine .  IV hydralazine  PRN SBP >160.   Held spironolactone  and losartan  in the setting of hyperkalemia. SBP currently in the 170-180's. Increased hydralazine  to 75mg  TID on 12/29, may further increase to 100mg  TID if bp remains elevated   Depression Patient attributes his depression to his chronic vision problems/progressive vision loss for which he is seen by ophthalmology.  -Since he lives alone and is having difficulty taking care of himself, [facility] consulted for nursing home placement.    Left lower lobe peribronchovascular pulmonary nodules Noted on CT and radiologist feels could be infectious versus inflammatory in etiology.  Infection less likely as he is not endorsing any respiratory symptoms and has no fever or leukocytosis.  Patient will need outpatient follow-up for repeat noncontrast CT chest in 6 months for further evaluation.   Anemia of chronic disease Hemoglobin stable, monitor labs.   COPD Stable, no signs of acute exacerbation.  Does not use any inhalers at home.   CAD status post CABG: Not endorsing any anginal symptoms and EKG without acute ischemic changes. History of CVA PAD Hold aspirin  and Plavix  given finding of acute cholecystitis on ultrasound and pending evaluation by general surgery.  Continue Lipitor .  Fever secondary to flu A -Temp of 103 noted on the AM of 12/28 - nasal swab pos for flu A - Pt reporting increased malaise this AM - Started tamiflu  12/29 -Cont supportive care     Subjective: Complaining of generalized malaise and nausea. Otherwise, reports sleeping  much better last night  Physical Exam: Vitals:   05/28/24 0500 05/28/24 0856 05/28/24 1138 05/28/24 1443  BP: (!) 185/66 (!) 183/57 (!) 157/64 (!) 176/68  Pulse: 72 75 70 68  Resp: (!) 22 16 19    Temp: 98.1 F (36.7 C) 98 F (36.7 C) 97.7 F (36.5 C)   TempSrc: Oral Oral Oral   SpO2: 99% 100%    Weight:      Height:       General exam: Awake, laying in bed, in nad Respiratory system: Normal respiratory effort, no wheezing Cardiovascular system: regular rate, s1, s2 Gastrointestinal system: Soft, nondistended, positive BS Central nervous system: CN2-12 grossly intact, strength intact Extremities: Perfused, no clubbing Skin: Normal skin turgor, no notable skin lesions seen Psychiatry: Mood normal // no visual hallucinations   Data Reviewed:  There are no new results to review at this time.  Family Communication: Pt in room, family not at bedside  Disposition: Status is: Inpatient Remains inpatient appropriate because: Severity of illness  Planned Discharge Destination: Skilled nursing facility    Author: Garnette Pelt, MD 05/28/2024 4:26 PM  For on call review [email or web address].  "

## 2024-05-28 NOTE — TOC Initial Note (Signed)
 Transition of Care [facility]) - Initial/Assessment Note    Patient Details  Name: Shawn Collins MRN: [id number] Date of Birth: [birth year]  Transition of Care [facility]) CM/SW Contact:    Montie LOISE Louder, LCSW Phone Number: 05/28/2024, 3:53 PM  Clinical Narrative:                  CSW met with patient, introduced self and explained role. CSW dicussed with patient recommendation for short term rehab at [facility]. Patient states he lives in a boarding home. He has the support of his friend and niece. He reports disability income each month. He expresses due to his impaired vision he is unable to take his medication correctly. Patient states no other concerns at home. He is agreeable to SNF. CSW explained the SNF process. He has been to [facility] recently. TOC will provide bed offers once available.   Montie Louder, MSW, LCSW Clinical Social Worker    Expected Discharge Plan: Skilled Nursing Facility Barriers to Discharge: English As A Second Language Teacher, Continued Medical Work up, SNF Pending bed offer   Patient Goals and CMS Choice            Expected Discharge Plan and Services In-house Referral: Clinical Social Work     Living arrangements for the past 2 months: Single Family Home                                      Prior Living Arrangements/Services Living arrangements for the past 2 months: Single Family Home Lives with:: Self Patient language and need for interpreter reviewed:: No        Need for Family Participation in Patient Care: Yes (Comment) Care giver support system in place?: Yes (comment)   Criminal Activity/Legal Involvement Pertinent to Current Situation/Hospitalization: No - Comment as needed  Activities of Daily Living      Permission Sought/Granted Permission sought to share information with : Family Supports Permission granted to share information with : Yes, Verbal Permission Granted  Share Information with NAME: Freddy Collum  Permission granted to  share info w AGENCY: SNFs  Permission granted to share info w Relationship: friend  Permission granted to share info w Contact Information: [phone]  Emotional Assessment Appearance:: Appears stated age Attitude/Demeanor/Rapport: Engaged Affect (typically observed): Accepting, Appropriate Orientation: : Oriented to Self, Oriented to Place, Oriented to  Time, Oriented to Situation Alcohol / Substance Use: Not Applicable Psych Involvement: No (comment)  Admission diagnosis:  Acalculous cholecystitis [K81.9] Hyperkalemia [E87.5] Hypertensive urgency [I16.0] Patient Active Problem List   Diagnosis Date Noted   Acute acalculous cholecystitis 05/25/2024   Hypertensive emergency 03/23/2024   Cognitive impairment 03/22/2024   Dry gangrene (HCC) 03/22/2024   HTN (hypertension) 09/13/2023   COVID-19 07/10/2023   Hyperkalemia 07/10/2023   Metabolic acidosis with increased anion gap and accumulation of organic acids 07/10/2023   Hypertensive urgency 07/10/2023   Hypoglycemia 07/10/2023   Weakness 07/10/2023   Syncope 07/05/2022   Syncope and collapse 07/04/2022   Type 2 diabetes mellitus (HCC) 07/04/2022   Anemia of chronic disease 07/04/2022   Acute metabolic encephalopathy due to hypoglycemia 12/30/2021   Hypothermia 12/30/2021   Uncontrolled type 2 diabetes mellitus with hypoglycemia, with long-term current use of insulin  (HCC) 12/30/2021   History of CVA (cerebrovascular accident) 12/30/2021   DNR (do not resuscitate) 12/30/2021   Stroke determined by clinical assessment (HCC) 12/18/2021   ESRD (end stage renal disease) on dialysis (HCC)  02/23/2021   DOE (dyspnea on exertion) 11/22/2017   Hyperlipidemia LDL goal <70 04/17/2017   CAD (coronary artery disease)    S/P CABG x 4 10/26/2016   Acute kidney injury superimposed on CKD    Abnormal nuclear stress test    Nausea & vomiting 10/16/2016   NSTEMI (non-ST elevated myocardial infarction) (HCC) 10/16/2016   CKD (chronic  kidney disease), stage III (HCC) 10/16/2016   COPD exacerbation (HCC) 09/10/2014   COPD (chronic obstructive pulmonary disease) (HCC) 09/10/2014   Viral gastroenteritis 09/10/2014   Hypoxia 09/10/2014   Dizzy-->?Orthostatic? 09/10/2014   Diabetes mellitus without complication (HCC) 09/10/2014   Hypertension 09/10/2014   Emphysema lung (HCC) 09/10/2014   PCP:  Shelda Atlas, MD Pharmacy:   [facility] [address], [place] - [address] [place] [address] Phone: [phone] Fax: [phone]  [place] [facility] [address] [place] [address] Phone: [phone] Fax: [phone]  [facility] [address] [place] [address] Phone: [phone] Fax: [phone]     Social Drivers of Health (SDOH) Social History: SDOH Screenings   Food Insecurity: No Food Insecurity (05/09/2024)  Housing: Unknown (05/25/2024)  Transportation Needs: No Transportation Needs (05/09/2024)  Utilities: Not At Risk (05/09/2024)  Social Connections: Moderately Isolated (05/09/2024)  Tobacco Use: Medium Risk (05/22/2024)   SDOH Interventions:     Readmission Risk Interventions    07/07/2022    9:49 AM  Readmission Risk Prevention Plan  Transportation Screening Complete  PCP or Specialist Appt within 3-5 Days Complete  HRI or Home Care Consult Complete  Social Work Consult for Recovery Care Planning/Counseling --  Palliative Care Screening Not Applicable  Medication Review Oceanographer) Complete

## 2024-05-29 ENCOUNTER — Encounter (HOSPITAL_COMMUNITY): Payer: Self-pay | Admitting: Internal Medicine

## 2024-05-29 DIAGNOSIS — E875 Hyperkalemia: Secondary | ICD-10-CM | POA: Diagnosis not present

## 2024-05-29 LAB — GLUCOSE, CAPILLARY
Glucose-Capillary: 62 mg/dL — ABNORMAL LOW (ref 70–99)
Glucose-Capillary: 106 mg/dL — ABNORMAL HIGH (ref 70–99)
Glucose-Capillary: 169 mg/dL — ABNORMAL HIGH (ref 70–99)
Glucose-Capillary: 88 mg/dL (ref 70–99)
Glucose-Capillary: 84 mg/dL (ref 70–99)

## 2024-05-29 MED ORDER — ORAL CARE MOUTH RINSE: 15.0000 mL | OROMUCOSAL | Status: DC | PRN

## 2024-05-29 MED ORDER — LOSARTAN POTASSIUM 50 MG PO TABS
100.0000 mg | ORAL_TABLET | Freq: Every evening | ORAL | Status: DC
  Administered 2024-05-29: 100 mg via ORAL
  Filled 2024-05-29 (×2): qty 2

## 2024-05-29 NOTE — Progress Notes (Addendum)
 Noted that pt going to snf. Attempted to call clinic to inform, no one available, social work at clinic not in office. Will provide this update on Friday after holiday.   Shawn Collins Dialysis Navigator [phone]

## 2024-05-29 NOTE — TOC Progression Note (Signed)
 Transition of Care [facility]) - Progression Note    Patient Details  Name: Shawn Collins MRN: [id number] Date of Birth: 13-Feb-1951  Transition of Care [facility]) CM/SW Contact  Montie LOISE Louder, [place] Phone Number: 05/29/2024, 1:30 PM  Clinical Narrative:     1:30pm- met w/patient- informed of bed offers- he accepted bed offer with [facility]. CSW sent message to [facility] to confirm availability- waiting on response   11;02 am - patient was sleep, unable to provide bed offfers  Expected Discharge Plan: Skilled Nursing Facility Barriers to Discharge: English As A Second Language Teacher, Continued Medical Work up, SNF Pending bed offer               Expected Discharge Plan and Services In-house Referral: Clinical Social Work     Living arrangements for the past 2 months: Single Family Home                                       Social Drivers of Health (SDOH) Interventions SDOH Screenings   Food Insecurity: No Food Insecurity (05/29/2024)  Housing: Low Risk (05/29/2024)  Transportation Needs: No Transportation Needs (05/29/2024)  Utilities: Not At Risk (05/29/2024)  Social Connections: Moderately Isolated (05/29/2024)  Tobacco Use: Medium Risk (05/29/2024)    Readmission Risk Interventions    07/07/2022    9:49 AM  Readmission Risk Prevention Plan  Transportation Screening Complete  PCP or Specialist Appt within 3-5 Days Complete  HRI or Home Care Consult Complete  Social Work Consult for Recovery Care Planning/Counseling --  Palliative Care Screening Not Applicable  Medication Review Oceanographer) Complete

## 2024-05-29 NOTE — TOC Progression Note (Signed)
 Transition of Care [facility]) - Progression Note    Patient Details  Name: Shawn Collins MRN: [id number] Date of Birth: [birth year]  Transition of Care [facility]) CM/SW Contact  Montie LOISE Louder, [place] Phone Number: 05/29/2024, 2:52 PM  Clinical Narrative:     [name] can admit on Friday after HD.  Sent message to Renal Navigator - expected d/c Friday  TOC will continue to follow and assist with discharge planning.   Montie Louder, MSW, LCSW Clinical Social Worker    Expected Discharge Plan: Skilled Nursing Facility Barriers to Discharge:  [facility]  can admit on Friday, after HD)               Expected Discharge Plan and Services In-house Referral: Clinical Social Work     Living arrangements for the past 2 months: Single Family Home                                       Social Drivers of Health (SDOH) Interventions SDOH Screenings   Food Insecurity: No Food Insecurity (05/29/2024)  Housing: Low Risk (05/29/2024)  Transportation Needs: No Transportation Needs (05/29/2024)  Utilities: Not At Risk (05/29/2024)  Social Connections: Moderately Isolated (05/29/2024)  Tobacco Use: Medium Risk (05/29/2024)    Readmission Risk Interventions    07/07/2022    9:49 AM  Readmission Risk Prevention Plan  Transportation Screening Complete  PCP or Specialist Appt within 3-5 Days Complete  HRI or Home Care Consult Complete  Social Work Consult for Recovery Care Planning/Counseling --  Palliative Care Screening Not Applicable  Medication Review Oceanographer) Complete

## 2024-05-29 NOTE — Progress Notes (Signed)
 " PROGRESS NOTE    Shawn Collins  FMW:[id number] DOB: [birth year] DOA: 05/24/2024 PCP: Shelda Atlas, MD   Brief Narrative:  This 73 y.o. male with medical history significant of ESRD on HD MWF, CAD status post CABG in 2018, COPD, CVA, type 2 diabetes, PAD, hypertension, hyperlipidemia, medication noncompliance.  Patient recently had clotted left forearm graft and was admitted by vascular surgery service and taken to the OR on 12/11 for open thrombectomy of the left forearm AV loop graft and revision of left forearm loop graft with interposition grafting from the venous limb to the basal leg vein.  Patient presents to the ED today via EMS for evaluation of generalized weakness and hypoglycemia.  Blood glucose was 53 with EMS and was given D10 with improvement of glucose to 117.  Patient AAO x 4.  Vital signs on arrival to the ED: Temperature 97.5 F, pulse 65, respiratory rate 16, blood pressure 238/89, and SpO2 95-96% on room air.  Labs notable for hemoglobin 10.3 (stable), sodium 133, chloride 93, bicarb 21, anion gap 18, glucose 66, AST 62 but remainder of LFTs normal, lipase normal, UA pending.  Potassium 6.3 on initial BMP but there was concern for possible hemolysis so i-STAT chemistry repeated which confirmed hyperkalemia with potassium of 6.0.  Chest x-ray showing heart size at upper limits of normal and no acute findings.  EKG showing normal sinus rhythm, slightly peaked T waves, and no acute ischemic changes.   Assessment & Plan:   Principal Problem:   Hyperkalemia Active Problems:   ESRD (end stage renal disease) on dialysis (HCC)   Hypertensive urgency   Hypoglycemia   Acute acalculous cholecystitis  Hyperkalemia in the setting of missed hemodialysis: ESRD on HD MWF Insulin  was not given due to hypoglycemia.  Patient was given albuterol , Lokelma , and calcium  gluconate in the ED.   Nephrology consulted and continue HD as per Nephro Held home spironolactone  and losartan . Potassium  normalized.   Acute acalculous cholecystitis noted on ultrasound: No nausea or vomiting.  No abdominal pain or tenderness.  No fever, leukocytosis, or signs of sepsis.   General Surgery consulted. HIDA reviewed, unremarkable. Surgery signed off Plavix  was initially held. Resumed Plavix    Hypoglycemia: Type 2 diabetes Hypoglycemic in the setting of very poor p.o. intake.   Last hemoglobin A1c 5.0 in October 2025.   Glycemic trends improved.   Hypertensive urgency: Due to medication noncompliance.  Continue amlodipine , and hydralazine .  IV hydralazine  PRN SBP >160.   Held spironolactone  and losartan  in the setting of hyperkalemia. SBP currently in the 170-180's. Increased hydralazine  to 75mg  TID on 12/29, may further increase to 100mg  TID if bp remains elevated.   Major Depression: Patient attributes his depression to his chronic vision problems / progressive vision loss for which he is seen by ophthalmology.  -Since he lives alone and is having difficulty taking care of himself, [facility] consulted for nursing home placement.    Left lower lobe peribronchovascular pulmonary nodules: Noted on CT and radiologist feels could be infectious versus inflammatory in etiology.  Infection less likely as he is not endorsing any respiratory symptoms and has no fever or leukocytosis.  Patient will need outpatient follow-up for repeat noncontrast CT chest in 6 months for further evaluation.   Anemia of chronic disease: Hemoglobin stable, monitor labs.   COPD: Stable, no signs of acute exacerbation.  Does not use any inhalers at home.   CAD status post CABG:  Not endorsing any anginal symptoms and EKG without  acute ischemic changes.  History of CVA PAD Held aspirin  and Plavix  given finding of acute cholecystitis on ultrasound and pending evaluation by general surgery.  Continue Lipitor .  Aspirin  and Plavix  resumed.   Fever secondary to flu A -Temp of 103 noted on the AM of 12/28 - nasal swab pos  for flu A - Pt reporting increased malaise this AM - Started tamiflu  12/29 -Cont supportive care.    DVT prophylaxis: Heparin  sq Code Status: (DNR) Family Communication:No family at bed side Disposition Plan:    Status is: Inpatient Remains inpatient appropriate because:  Patient admitted for hyperkalemia due to missed hemodialysis.  Patient was evaluated by nephrology and started on dialysis. Patient is medically ready for discharge.   Consultants:  Nephrology  Procedures: HD  Antimicrobials:  Anti-infectives (From admission, onward)    Start     Dose/Rate Route Frequency Ordered Stop   05/29/24 1200  oseltamivir  (TAMIFLU ) capsule 30 mg        30 mg Oral Every M-W-F (Hemodialysis) 05/27/24 1227 06/03/24 1159   05/27/24 1230  oseltamivir  (TAMIFLU ) capsule 30 mg        30 mg Oral NOW 05/27/24 1225 05/27/24 1400   05/25/24 1000  cefTRIAXone  (ROCEPHIN ) 2 g in sodium chloride  0.9 % 100 mL IVPB  Status:  Discontinued        2 g 200 mL/hr over 30 Minutes Intravenous Daily 05/25/24 0159 05/29/24 0856   05/25/24 1000  metroNIDAZOLE  (FLAGYL ) IVPB 500 mg  Status:  Discontinued        500 mg 100 mL/hr over 60 Minutes Intravenous Every 12 hours 05/25/24 0159 05/29/24 0857   05/24/24 2300  cefTRIAXone  (ROCEPHIN ) 1 g in sodium chloride  0.9 % 100 mL IVPB        1 g 200 mL/hr over 30 Minutes Intravenous  Once 05/24/24 2258 05/25/24 0117   05/24/24 2300  metroNIDAZOLE  (FLAGYL ) IVPB 500 mg        500 mg 100 mL/hr over 60 Minutes Intravenous  Once 05/24/24 2258 05/25/24 9786      Subjective: Patient was seen and examined at bedside.  Overnight events noted. Patient reports doing much better.  He is going to have dialysis today.  He denies any concerns. He is on isolation for flu A infection.  On Tamiflu   Objective: Vitals:   05/28/24 2318 05/29/24 0342 05/29/24 0813 05/29/24 1200  BP: (!) 145/74 (!) 170/64 (!) 181/68 (!) 161/66  Pulse: 69 (!) 102 66 63  Resp: 20 20 16 11   Temp:  98.4 F (36.9 C) 98 F (36.7 C) 98.5 F (36.9 C) 98.6 F (37 C)  TempSrc: Oral Oral Oral Oral  SpO2: 99% 97% 96% 97%  Weight:      Height:       No intake or output data in the 24 hours ending 05/29/24 1334 Filed Weights   05/24/24 1529 05/25/24 1527 05/27/24 0831  Weight: 61.2 kg 56.3 kg 59.2 kg    Examination:  General exam: Appears calm and comfortable, deconditioned, not in any acute distress. Respiratory system: Clear to auscultation. Respiratory effort normal.  RR 14 Cardiovascular system: S1 & S2 heard, RRR. No JVD, murmurs, rubs, gallops or clicks.  Gastrointestinal system: Abdomen is non distended, soft and non tender. Normal bowel sounds heard. Central nervous system: Alert and oriented x 3. No focal neurological deficits. Extremities: No edema, no cyanosis, no clubbing. Skin: No rashes, lesions or ulcers Psychiatry: Judgement and insight appear normal. Mood & affect appropriate.  Data Reviewed: I have personally reviewed following labs and imaging studies  CBC: Recent Labs  Lab 05/24/24 1603 05/24/24 2137 05/25/24 0242 05/26/24 0322 05/27/24 0319 05/27/24 1324  WBC 5.3  --  6.1 4.2 4.6 3.6*  NEUTROABS 4.2  --   --  2.8  --   --   HGB 10.3* 11.6* 9.4* 8.8* 8.9* 10.4*  HCT 32.4* 34.0* 29.8* 27.3* 27.3* 31.5*  MCV 99.4  --  100.0 97.5 95.8 94.3  PLT 263  --  254 196 195 214   Basic Metabolic Panel: Recent Labs  Lab 05/25/24 0242 05/26/24 0322 05/26/24 1026 05/27/24 0319 05/27/24 1324  NA 131* 128* 131* 130* 134*  K 5.5* 5.5* 4.9 4.2 3.5  CL 92* 93* 93* 92* 94*  CO2 17* 21* 24 23 26   GLUCOSE 107* 130* 95 67* 47*  BUN 77* 50* 51* 58* 25*  CREATININE 11.00* 8.08* 8.55* 9.75* 4.82*  CALCIUM  8.8* 7.9* 7.9* 8.4* 8.5*  PHOS  --  5.6*  --   --  3.2   GFR: Estimated Creatinine Clearance: 11.1 mL/min (A) (by C-G formula based on SCr of 4.82 mg/dL (H)). Liver Function Tests: Recent Labs  Lab 05/24/24 2109 05/25/24 0242 05/26/24 0322  05/27/24 0319 05/27/24 1324  AST 62* 67*  --  61*  --   ALT 26 24  --  18  --   ALKPHOS 94 83  --  62  --   BILITOT 0.5 0.4  --  0.3  --   PROT 7.4 7.0  --  5.8*  --   ALBUMIN  4.0 3.8 3.1* 3.3* 3.6   Recent Labs  Lab 05/24/24 2109  LIPASE 12   No results for input(s): AMMONIA in the last 168 hours. Coagulation Profile: No results for input(s): INR, PROTIME in the last 168 hours. Cardiac Enzymes: No results for input(s): CKTOTAL, CKMB, CKMBINDEX, TROPONINI in the last 168 hours. BNP (last 3 results) No results for input(s): PROBNP in the last 8760 hours. HbA1C: No results for input(s): HGBA1C in the last 72 hours. CBG: Recent Labs  Lab 05/28/24 1910 05/28/24 2321 05/29/24 0344 05/29/24 0811 05/29/24 1159  GLUCAP 130* 74 62* 106* 169*   Lipid Profile: No results for input(s): CHOL, HDL, LDLCALC, TRIG, CHOLHDL, LDLDIRECT in the last 72 hours. Thyroid  Function Tests: No results for input(s): TSH, T4TOTAL, FREET4, T3FREE, THYROIDAB in the last 72 hours. Anemia Panel: No results for input(s): VITAMINB12, FOLATE, FERRITIN, TIBC, IRON, RETICCTPCT in the last 72 hours. Sepsis Labs: No results for input(s): PROCALCITON, LATICACIDVEN in the last 168 hours.  Recent Results (from the past 240 hours)  Resp panel by RT-PCR (RSV, Flu A&B, Covid) Anterior Nasal Swab     Status: Abnormal   Collection Time: 05/26/24  8:06 AM   Specimen: Anterior Nasal Swab  Result Value Ref Range Status   SARS Coronavirus 2 by RT PCR NEGATIVE NEGATIVE Final   Influenza A by PCR POSITIVE (A) NEGATIVE Final   Influenza B by PCR NEGATIVE NEGATIVE Final    Comment: (NOTE) The Xpert Xpress SARS-CoV-2/FLU/RSV plus assay is intended as an aid in the diagnosis of influenza from Nasopharyngeal swab specimens and should not be used as a sole basis for treatment. Nasal washings and aspirates are unacceptable for Xpert Xpress  SARS-CoV-2/FLU/RSV testing.  Fact Sheet for Patients: bloggercourse.com  Fact Sheet for Healthcare Providers: seriousbroker.it  This test is not yet approved or cleared by the [place]  FDA and has been authorized for detection and/or  diagnosis of SARS-CoV-2 by FDA under an Emergency Use Authorization (EUA). This EUA will remain in effect (meaning this test can be used) for the duration of the COVID-19 declaration under Section 564(b)(1) of the Act, 21 U.S.C. section 360bbb-3(b)(1), unless the authorization is terminated or revoked.     Resp Syncytial Virus by PCR NEGATIVE NEGATIVE Final    Comment: (NOTE) Fact Sheet for Patients: bloggercourse.com  Fact Sheet for Healthcare Providers: seriousbroker.it  This test is not yet approved or cleared by the [place]  FDA and has been authorized for detection and/or diagnosis of SARS-CoV-2 by FDA under an Emergency Use Authorization (EUA). This EUA will remain in effect (meaning this test can be used) for the duration of the COVID-19 declaration under Section 564(b)(1) of the Act, 21 U.S.C. section 360bbb-3(b)(1), unless the authorization is terminated or revoked.  Performed at [facility], [address] [place], [place] [address]   Culture, blood (Routine X 2) w Reflex to ID Panel     Status: None (Preliminary result)   Collection Time: 05/26/24 11:30 AM   Specimen: BLOOD  Result Value Ref Range Status   Specimen Description BLOOD RIGHT ARM  Final   Special Requests   Final    BOTTLES DRAWN AEROBIC AND ANAEROBIC Blood Culture adequate volume   Culture   Final    NO GROWTH 3 DAYS Performed at [facility], [address], [place], [place] [address]    Report Status PENDING  Incomplete  Culture, blood (Routine X 2) w Reflex to ID Panel     Status: None (Preliminary result)   Collection Time: 05/26/24 11:30  AM   Specimen: BLOOD  Result Value Ref Range Status   Specimen Description BLOOD RIGHT HAND  Final   Special Requests   Final    BOTTLES DRAWN AEROBIC ONLY Blood Culture results may not be optimal due to an inadequate volume of blood received in culture bottles   Culture   Final    NO GROWTH 3 DAYS Performed at [facility], [address] [place], [place] [address]    Report Status PENDING  Incomplete    Radiology Studies: No results found.  Scheduled Meds:  amLODipine   10 mg Oral QHS   atorvastatin   40 mg Oral QHS   Chlorhexidine  Gluconate Cloth  6 each Topical Q0600   Chlorhexidine  Gluconate Cloth  6 each Topical Q0600   clopidogrel   75 mg Oral Daily   darbepoetin (ARANESP ) injection - DIALYSIS  100 mcg Subcutaneous Q Mon-1800   doxercalciferol   3 mcg Intravenous Q M,W,F-HD   hydrALAZINE   100 mg Oral Q8H   insulin  aspart  0-6 Units Subcutaneous Q4H   losartan   100 mg Oral QPM   oseltamivir   30 mg Oral Q M,W,F-HD   sevelamer  carbonate  1,600 mg Oral TID WC   Continuous Infusions:  anticoagulant sodium citrate        LOS: 5 days    Time spent: 50 mins    Darcel Dawley, MD [facility]   If 7PM-7AM, please contact night-coverage  "

## 2024-05-29 NOTE — Progress Notes (Signed)
 [facility] NEPHROLOGY PROGRESS NOTE  Assessment/ Plan: Pt is a 73 y.o. yo male  with ESRD on HD, HTN, DM, CAD s/p CABG, prior CVA who presented to the ED with weakness and hypoglycemia.   Dialysis Orders:  Unit: [facility] Schedule: MWF Time: 3:45 EDW: 58.6 kg Flows: 450/800 Bath: 2K/2.5Ca  Access: AVG Heparin : None  ESA: Mircera 60 q 2 wks (last 12/17) VDRA: Hectorol  3 q HD   # Hyperkalemia due to missed/incomplete outpatient dialysis as well as home meds losartan  and spironolactone  - Improved with HD and tolerating current regimen  - would remain off of spironolactone    # ESRD:  - HD per MWF schedule - next tx on 12/31.  Note that due to patient volumes and staffing, his treatment may be rolled over to 1/1.  We will get him today if we can, though - renal panel in AM  # Anemia of CKD:  - on weekly Aranesp  100 mcg every Monday currently and may be able to titrate down over time - CBC in AM   # Secondary hyperparathyroidism/hyperphosphatemia:  - Continue on Renvela  and Hectorol .  # HTN/volume: - optimize volume with HD  - off of spironolactone  which was reported as a home med - increased hydralazine  to 100 mg TID - he is reported as not taking clonidine  and if not compliant would not resume this - see that it is off - retime losartan  for evening so that it's less likely to interfere with HD  # Abdominal pain, fever: HIDA scan normal.  evaluated by general surgery.  On ceftriaxone .  Abx per primary team   # Disposition: Patient said he lives alone and has impaired vision.  Spoke with HD SW and outpatient nephrologist.  Per prior patterns, his outpatient team strongly recommends a SNF which sounds like may need to be a permanent residence for him.  He has had recurrent issues with self care after discharge from SNF in the past per his outpatient team.  He cannot manage well at home    Subjective:   He had no UOP documented over 12/30.  He last had HD on 12/29  with 1.5 kg UF.  He feels ok today.  Worked with therapy.  Hasn't had HD yet today.  Spoke with RN and let her know ok to give hydralazine  and that losartan  was being moved to evening dosing to facilitate not missing on HD days.   Reviews of system:      Denies shortness of breath or chest pain  No n/v; some abdominal pain    Objective Vital signs in last 24 hours: Vitals:   05/28/24 2318 05/29/24 0342 05/29/24 0813 05/29/24 1200  BP: (!) 145/74 (!) 170/64 (!) 181/68 (!) 161/66  Pulse: 69 (!) 102 66 63  Resp: 20 20 16 11   Temp: 98.4 F (36.9 C) 98 F (36.7 C) 98.5 F (36.9 C) 98.6 F (37 C)  TempSrc: Oral Oral Oral Oral  SpO2: 99% 97% 96% 97%  Weight:      Height:       Weight change:  No intake or output data in the 24 hours ending 05/29/24 1251      Labs: RENAL PANEL Recent Labs  Lab 05/24/24 2109 05/24/24 2137 05/25/24 0242 05/26/24 0322 05/26/24 1026 05/27/24 0319 05/27/24 1324  NA 133*   < > 131* 128* 131* 130* 134*  K 6.3*   < > 5.5* 5.5* 4.9 4.2 3.5  CL 93*   < > 92*  93* 93* 92* 94*  CO2 21*  --  17* 21* 24 23 26   GLUCOSE 66*   < > 107* 130* 95 67* 47*  BUN 76*   < > 77* 50* 51* 58* 25*  CREATININE 10.80*   < > 11.00* 8.08* 8.55* 9.75* 4.82*  CALCIUM  8.9  --  8.8* 7.9* 7.9* 8.4* 8.5*  PHOS  --   --   --  5.6*  --   --  3.2  ALBUMIN  4.0  --  3.8 3.1*  --  3.3* 3.6   < > = values in this interval not displayed.    Liver Function Tests: Recent Labs  Lab 05/24/24 2109 05/25/24 0242 05/26/24 0322 05/27/24 0319 05/27/24 1324  AST 62* 67*  --  61*  --   ALT 26 24  --  18  --   ALKPHOS 94 83  --  62  --   BILITOT 0.5 0.4  --  0.3  --   PROT 7.4 7.0  --  5.8*  --   ALBUMIN  4.0 3.8 3.1* 3.3* 3.6   Recent Labs  Lab 05/24/24 2109  LIPASE 12   No results for input(s): AMMONIA in the last 168 hours. CBC: Recent Labs    07/12/23 0630 07/13/23 0808 05/24/24 2137 05/25/24 0242 05/26/24 0322 05/27/24 0319 05/27/24 1324  HGB  --    < > 11.6*  9.4* 8.8* 8.9* 10.4*  MCV  --    < >  --  100.0 97.5 95.8 94.3  FERRITIN 821*  --   --   --   --   --   --   TIBC 181*  --   --   --   --   --   --   IRON 22*  --   --   --   --   --   --    < > = values in this interval not displayed.    Cardiac Enzymes: No results for input(s): CKTOTAL, CKMB, CKMBINDEX, TROPONINI in the last 168 hours. CBG: Recent Labs  Lab 05/28/24 1910 05/28/24 2321 05/29/24 0344 05/29/24 0811 05/29/24 1159  GLUCAP 130* 74 62* 106* 169*    Iron Studies: No results for input(s): IRON, TIBC, TRANSFERRIN, FERRITIN in the last 72 hours. Studies/Results: No results found.   Medications: Infusions:  anticoagulant sodium citrate       Scheduled Medications:  amLODipine   10 mg Oral QHS   atorvastatin   40 mg Oral QHS   Chlorhexidine  Gluconate Cloth  6 each Topical Q0600   Chlorhexidine  Gluconate Cloth  6 each Topical Q0600   clopidogrel   75 mg Oral Daily   darbepoetin (ARANESP ) injection - DIALYSIS  100 mcg Subcutaneous Q Mon-1800   doxercalciferol   3 mcg Intravenous Q M,W,F-HD   hydrALAZINE   100 mg Oral Q8H   insulin  aspart  0-6 Units Subcutaneous Q4H   losartan   100 mg Oral Daily   oseltamivir   30 mg Oral Q M,W,F-HD   sevelamer  carbonate  1,600 mg Oral TID WC    have reviewed scheduled and prn medications.  Physical Exam:      General adult male in bed in no acute distress HEENT normocephalic atraumatic extraocular movements intact sclera anicteric Neck supple trachea midline Lungs clear to auscultation bilaterally normal work of breathing at rest  Heart S1S2 no rub  Abdomen soft nontender nondistended Extremities no edema appreciated Psych normal mood and affect Neuro conversant and answers questions  Access LUE AVG with bruit  Katheryn BROCKS Zahra Peffley 05/29/2024,1:09 PM  LOS: 5 days

## 2024-05-29 NOTE — Plan of Care (Signed)
  Problem: Education: Goal: Knowledge of General Education information will improve Description: Including pain rating scale, medication(s)/side effects and non-pharmacologic comfort measures Outcome: Progressing   Problem: Health Behavior/Discharge Planning: Goal: Ability to manage health-related needs will improve Outcome: Progressing   Problem: Clinical Measurements: Goal: Will remain free from infection Outcome: Progressing Goal: Diagnostic test results will improve Outcome: Progressing Goal: Respiratory complications will improve Outcome: Progressing Goal: Cardiovascular complication will be avoided Outcome: Progressing   Problem: Coping: Goal: Level of anxiety will decrease Outcome: Progressing

## 2024-05-29 NOTE — Progress Notes (Signed)
 Mobility Specialist Progress Note:    05/29/24 1223  Mobility  Activity Pivoted/transferred from bed to chair  Level of Assistance Minimal assist, patient does 75% or more  Assistive Device Front wheel walker  Distance Ambulated (ft) 8 ft  Activity Response Tolerated well  Mobility Referral Yes  Mobility visit 1 Mobility  Mobility Specialist Start Time (ACUTE ONLY) 1206  Mobility Specialist Stop Time (ACUTE ONLY) 1220  Mobility Specialist Time Calculation (min) (ACUTE ONLY) 14 min   Received pt in bed and eager for mobility. Pt required MinA STS, otherwise MinG. Pt required VC for instruction. VSS throughout. Pt left in chair with alarm on. Personal belongings and call light within reach. All needs met.  [name] Mobility Specialist  Please contact via [employer] or  Rehab Office [phone]

## 2024-05-30 DIAGNOSIS — E875 Hyperkalemia: Secondary | ICD-10-CM | POA: Diagnosis not present

## 2024-05-30 LAB — CBC
HCT: 31.2 % — ABNORMAL LOW (ref 39.0–52.0)
Hemoglobin: 10.4 g/dL — ABNORMAL LOW (ref 13.0–17.0)
MCH: 31.1 pg (ref 26.0–34.0)
MCHC: 33.3 g/dL (ref 30.0–36.0)
MCV: 93.4 fL (ref 80.0–100.0)
Platelets: 260 K/uL (ref 150–400)
RBC: 3.34 MIL/uL — ABNORMAL LOW (ref 4.22–5.81)
RDW: 15.1 % (ref 11.5–15.5)
WBC: 3.8 K/uL — ABNORMAL LOW (ref 4.0–10.5)
nRBC: 0 % (ref 0.0–0.2)

## 2024-05-30 LAB — GLUCOSE, CAPILLARY
Glucose-Capillary: 104 mg/dL — ABNORMAL HIGH (ref 70–99)
Glucose-Capillary: 111 mg/dL — ABNORMAL HIGH (ref 70–99)
Glucose-Capillary: 134 mg/dL — ABNORMAL HIGH (ref 70–99)
Glucose-Capillary: 59 mg/dL — ABNORMAL LOW (ref 70–99)
Glucose-Capillary: 72 mg/dL (ref 70–99)
Glucose-Capillary: 90 mg/dL (ref 70–99)
Glucose-Capillary: 92 mg/dL (ref 70–99)

## 2024-05-30 LAB — MAGNESIUM: Magnesium: 2.1 mg/dL (ref 1.7–2.4)

## 2024-05-30 LAB — RENAL FUNCTION PANEL
Albumin: 3.6 g/dL (ref 3.5–5.0)
Anion gap: 13 (ref 5–15)
BUN: 16 mg/dL (ref 8–23)
CO2: 28 mmol/L (ref 22–32)
Calcium: 8.5 mg/dL — ABNORMAL LOW (ref 8.9–10.3)
Chloride: 94 mmol/L — ABNORMAL LOW (ref 98–111)
Creatinine, Ser: 4.39 mg/dL — ABNORMAL HIGH (ref 0.61–1.24)
GFR, Estimated: 13 mL/min — ABNORMAL LOW
Glucose, Bld: 54 mg/dL — ABNORMAL LOW (ref 70–99)
Phosphorus: 2.8 mg/dL (ref 2.5–4.6)
Potassium: 3.8 mmol/L (ref 3.5–5.1)
Sodium: 134 mmol/L — ABNORMAL LOW (ref 135–145)

## 2024-05-30 LAB — PHOSPHORUS: Phosphorus: 2.8 mg/dL (ref 2.5–4.6)

## 2024-05-30 MED ORDER — DOXERCALCIFEROL 4 MCG/2ML IV SOLN
3.0000 ug | INTRAVENOUS | Status: DC
  Administered 2024-05-30: 3 ug via INTRAVENOUS
  Filled 2024-05-30 (×2): qty 2

## 2024-05-30 MED ORDER — OSELTAMIVIR PHOSPHATE 30 MG PO CAPS
30.0000 mg | ORAL_CAPSULE | ORAL | Status: AC
  Administered 2024-05-30: 30 mg via ORAL
  Filled 2024-05-30: qty 1

## 2024-05-30 MED ORDER — CHLORHEXIDINE GLUCONATE CLOTH 2 % EX PADS
6.0000 | MEDICATED_PAD | Freq: Every day | CUTANEOUS | Status: DC
  Administered 2024-05-31: 6 via TOPICAL

## 2024-05-30 NOTE — Progress Notes (Signed)
 Hypoglycemic Event  CBG: 59 mg/dl at 9193  Treatment: 3 Oz Juice/soda  Symptoms:  Drowsy/sleepy  Follow-up CBG: Time:0916 CBG Result:134 mg/dl  Possible Reasons for Event:  poor oral intake, pt was in HD.     Christophr Calix

## 2024-05-30 NOTE — Progress Notes (Signed)
 Patient remained drowsy throughout the day, had no food intake, offered drinks frequently, patient was able to answers questions in short sentences, denied any pain/ discomfort, informed MD regarding hypertension and drowsiness.    05/30/24 1648  Assess: MEWS Score  Temp 98.5 F (36.9 C)  BP (!) 188/74  MAP (mmHg) 107  Pulse Rate 71  Resp 18  Level of Consciousness Alert  SpO2 93 %  O2 Device Room Air  Assess: MEWS Score  MEWS Temp 0  MEWS Systolic 0  MEWS Pulse 0  MEWS RR 0  MEWS LOC 0  MEWS Score 0  MEWS Score Color Green  Provider Notification  Provider Severiano Dawley, MD  Date Provider Notified 05/30/24  Time Provider Notified 1650  Method of Notification Page  Notification Reason Other (Comment) (Hypertension)  Assess: SIRS CRITERIA  SIRS Temperature  0  SIRS Respirations  0  SIRS Pulse 0  SIRS WBC 1  SIRS Score Sum  1

## 2024-05-30 NOTE — Progress Notes (Signed)
" °   05/30/24 0530  Vitals  BP (!) 166/75  BP Location Left Arm  BP Method Automatic  Patient Position (if appropriate) Lying  Pulse Rate 60  Resp 20  Oxygen Therapy  SpO2 100 %  O2 Device Room Air  During Treatment Monitoring  Blood Flow Rate (mL/min) 350 mL/min  Arterial Pressure (mmHg) -130 mmHg  Venous Pressure (mmHg) 300 mmHg  TMP (mmHg) 11 mmHg  Ultrafiltration Rate (mL/min) 786 mL/min  Dialysate Flow Rate (mL/min) 300 ml/min  Dialysate Potassium Concentration 3  Dialysate Calcium  Concentration 2.5  HD Safety Checks Performed Yes  Intra-Hemodialysis Comments Tx completed (blood return due to clotting)  Post Treatment  Dialyzer Clearance Heavily streaked  Liters Processed 1100  Fluid Removed (mL) 72 mL  Tolerated HD Treatment Yes  AVG/AVF Arterial Site Held (minutes) 10 minutes  AVG/AVF Venous Site Held (minutes) 10 minutes  Fistula / Graft Left Upper arm Arteriovenous vein graft  Placement Date/Time: 12/21/21 1103   Orientation: Left  Access Location: Upper arm  Access Type: Arteriovenous vein graft  Site Condition No complications  Fistula / Graft Assessment Present;Thrill;Bruit  Needle Size 15  Drainage Description None    "

## 2024-05-30 NOTE — Progress Notes (Signed)
 [facility] NEPHROLOGY PROGRESS NOTE  Assessment/ Plan: Pt is a 74 y.o. yo male  with ESRD on HD, HTN, DM, CAD s/p CABG, prior CVA who presented to the ED with weakness and hypoglycemia.   Dialysis Orders:  Unit: [facility] Schedule: MWF Time: 3:45 EDW: 58.6 kg Flows: 450/800 Bath: 2K/2.5Ca  Access: AVG Heparin : None  ESA: Mircera 60 q 2 wks (last 12/17) VDRA: Hectorol  3 q HD   # Hyperkalemia due to missed/incomplete outpatient dialysis as well as home meds losartan  and spironolactone  - Improved with HD and tolerating current regimen  - would remain off of spironolactone    # ESRD:  - HD per MWF schedule   # HTN/volume: - optimize volume with HD  - off of spironolactone  which was reported as a home med - would not resume due to hyperkalemia - he is reported as not taking clonidine  and if not compliant would not resume this - see that it is off - retimed losartan  for evening so that it's less likely to interfere with HD  # Anemia of CKD:  - on weekly Aranesp  100 mcg every Monday currently and may be able to titrate down over time  # Secondary hyperparathyroidism/hyperphosphatemia:  - Continue on Renvela  and Hectorol .  # Abdominal pain, fever: HIDA scan normal.  evaluated by general surgery.  On ceftriaxone .  Abx per primary team   # Disposition: Patient said he lives alone and has impaired vision.  Spoke with HD SW and outpatient nephrologist.  Per prior patterns, his outpatient team strongly recommends a SNF which sounds like may need to be a permanent residence for him.  He has had recurrent issues with self care after discharge from SNF in the past per his outpatient team.  He cannot manage well at home    Subjective:   He had no UOP documented over 12/31.  He last had HD overnight - ended this AM with under 100 mL UF per note (however goal had been 1.5 kg and the HD RN verbally reported to the receiving nurse that he'd gotten 1.1 liters off with HD).  Had a  hypoglycemic event charted and per nursing doesn't have good oral intake.  Primary team is aware. Not on insulin  or other similar meds.     Reviews of system:      Denies shortness of breath or chest pain  No n/v; some abdominal pain    Objective Vital signs in last 24 hours: Vitals:   05/30/24 0430 05/30/24 0500 05/30/24 0530 05/30/24 0816  BP: (!) 186/72 (!) 176/7 (!) 166/75 (!) 179/74  Pulse: 60  60 73  Resp: 20  20 15   Temp:    97.6 F (36.4 C)  TempSrc:    Oral  SpO2: 100%  100% 98%  Weight:      Height:       Weight change:   Intake/Output Summary (Last 24 hours) at 05/30/2024 1135 Last data filed at 05/30/2024 0800 Gross per 24 hour  Intake 240 ml  Output 72 ml  Net 168 ml        Labs: RENAL PANEL Recent Labs  Lab 05/25/24 0242 05/26/24 0322 05/26/24 1026 05/27/24 0319 05/27/24 1324 05/30/24 0805 05/30/24 0806  NA 131* 128* 131* 130* 134*  --  134*  K 5.5* 5.5* 4.9 4.2 3.5  --  3.8  CL 92* 93* 93* 92* 94*  --  94*  CO2 17* 21* 24 23 26   --  28  GLUCOSE 107*  130* 95 67* 47*  --  54*  BUN 77* 50* 51* 58* 25*  --  16  CREATININE 11.00* 8.08* 8.55* 9.75* 4.82*  --  4.39*  CALCIUM  8.8* 7.9* 7.9* 8.4* 8.5*  --  8.5*  MG  --   --   --   --   --  2.1  --   PHOS  --  5.6*  --   --  3.2 2.8 2.8  ALBUMIN  3.8 3.1*  --  3.3* 3.6  --  3.6    Liver Function Tests: Recent Labs  Lab 05/24/24 2109 05/25/24 0242 05/26/24 0322 05/27/24 0319 05/27/24 1324 05/30/24 0806  AST 62* 67*  --  61*  --   --   ALT 26 24  --  18  --   --   ALKPHOS 94 83  --  62  --   --   BILITOT 0.5 0.4  --  0.3  --   --   PROT 7.4 7.0  --  5.8*  --   --   ALBUMIN  4.0 3.8   < > 3.3* 3.6 3.6   < > = values in this interval not displayed.   Recent Labs  Lab 05/24/24 2109  LIPASE 12   No results for input(s): AMMONIA in the last 168 hours. CBC: Recent Labs    07/12/23 0630 07/13/23 0808 05/25/24 0242 05/26/24 0322 05/27/24 0319 05/27/24 1324 05/30/24 0805  HGB  --    <  > 9.4* 8.8* 8.9* 10.4* 10.4*  MCV  --    < > 100.0 97.5 95.8 94.3 93.4  FERRITIN 821*  --   --   --   --   --   --   TIBC 181*  --   --   --   --   --   --   IRON 22*  --   --   --   --   --   --    < > = values in this interval not displayed.    Cardiac Enzymes: No results for input(s): CKTOTAL, CKMB, CKMBINDEX, TROPONINI in the last 168 hours. CBG: Recent Labs  Lab 05/29/24 1639 05/29/24 1959 05/29/24 2359 05/30/24 0806 05/30/24 0916  GLUCAP 88 84 72 59* 134*    Iron Studies: No results for input(s): IRON, TIBC, TRANSFERRIN, FERRITIN in the last 72 hours. Studies/Results: No results found.   Medications: Infusions:  anticoagulant sodium citrate       Scheduled Medications:  amLODipine   10 mg Oral QHS   atorvastatin   40 mg Oral QHS   clopidogrel   75 mg Oral Daily   darbepoetin (ARANESP ) injection - DIALYSIS  100 mcg Subcutaneous Q Mon-1800   doxercalciferol   3 mcg Intravenous Q M,W,F-HD   hydrALAZINE   100 mg Oral Q8H   insulin  aspart  0-6 Units Subcutaneous Q4H   losartan   100 mg Oral QPM   sevelamer  carbonate  1,600 mg Oral TID WC    have reviewed scheduled and prn medications.  Physical Exam:        General adult male in bed in no acute distress HEENT normocephalic atraumatic extraocular movements intact sclera anicteric Neck supple trachea midline Lungs clear to auscultation bilaterally normal work of breathing at rest  Heart S1S2 no rub  Abdomen soft nontender nondistended Extremities no edema appreciated Psych normal mood and affect Neuro eyes closed but answers questions. Oriented to person and then on year states 74 then 73 and finally 20[birth year].  Access LUE  AVG with bruit  Katheryn JAYSON Saba 05/30/2024,11:59 AM  LOS: 6 days

## 2024-05-30 NOTE — Plan of Care (Signed)
" °  Problem: Activity: Goal: Risk for activity intolerance will decrease Outcome: Progressing   Problem: Nutrition: Goal: Adequate nutrition will be maintained Outcome: Progressing   Problem: Safety: Goal: Ability to remain free from injury will improve Outcome: Progressing   Problem: Fluid Volume: Goal: Ability to maintain a balanced intake and output will improve Outcome: Progressing   Problem: Metabolic: Goal: Ability to maintain appropriate glucose levels will improve Outcome: Progressing   "

## 2024-05-30 NOTE — Progress Notes (Signed)
 " PROGRESS NOTE    Shawn Collins  FMW:[id number] DOB: March 26, 1951 DOA: 05/24/2024 PCP: Shelda Atlas, MD   Brief Narrative:  This 74 y.o. male with medical history significant of ESRD on HD MWF, CAD status post CABG in 2018, COPD, CVA, type 2 diabetes, PAD, hypertension, hyperlipidemia, medication noncompliance.  Patient recently had clotted left forearm graft and was admitted by vascular surgery service and taken to the OR on 12/11 for open thrombectomy of the left forearm AV loop graft and revision of left forearm loop graft with interposition grafting from the venous limb to the basal leg vein.  Patient presents to the ED today via EMS for evaluation of generalized weakness and hypoglycemia.  Blood glucose was 53 with EMS and was given D10 with improvement of glucose to 117.  Patient AAO x 4.  Vital signs on arrival to the ED: Temperature 97.5 F, pulse 65, respiratory rate 16, blood pressure 238/89, and SpO2 95-96% on room air.  Labs notable for hemoglobin 10.3 (stable), sodium 133, chloride 93, bicarb 21, anion gap 18, glucose 66, AST 62 but remainder of LFTs normal, lipase normal, UA pending.  Potassium 6.3 on initial BMP but there was concern for possible hemolysis so i-STAT chemistry repeated which confirmed hyperkalemia with potassium of 6.0.  Chest x-ray showing heart size at upper limits of normal and no acute findings.  EKG showing normal sinus rhythm, slightly peaked T waves, and no acute ischemic changes.   Assessment & Plan:   Principal Problem:   Hyperkalemia Active Problems:   ESRD (end stage renal disease) on dialysis (HCC)   Hypertensive urgency   Hypoglycemia   Acute acalculous cholecystitis  Hyperkalemia in the setting of missed hemodialysis: ESRD on HD MWF Insulin  was not given due to hypoglycemia.  Patient was given albuterol , Lokelma , and calcium  gluconate in the ED.   Nephrology consulted and continue HD as per Nephro Held home spironolactone  and losartan . Potassium  normalized.   Acute acalculous cholecystitis noted on ultrasound: No nausea or vomiting.  No abdominal pain or tenderness.  No fever, leukocytosis, or signs of sepsis.   General Surgery consulted. HIDA reviewed, unremarkable. Surgery signed off Plavix  was initially held. Resumed Plavix    Hypoglycemia: Type 2 diabetes: Hypoglycemic in the setting of very poor p.o. intake.   Last hemoglobin A1c 5.0 in October 2025.   Glycemic trends improved.   Hypertensive urgency: Due to medication noncompliance.  Continue amlodipine , and hydralazine .  IV hydralazine  PRN SBP >160.   Held spironolactone  and losartan  in the setting of hyperkalemia. SBP currently in the 170-180's. Increased hydralazine  to 75mg  TID on 12/29, may further increase to 100mg  TID if bp remains elevated.   Major Depression: Patient attributes his depression to his chronic vision problems / progressive vision loss for which he is seen by ophthalmology.  -Since he lives alone and is having difficulty taking care of himself, [facility] consulted for nursing home placement.    Left lower lobe peribronchovascular pulmonary nodules: Noted on CT and radiologist feels could be infectious versus inflammatory in etiology.  Infection less likely as he is not endorsing any respiratory symptoms and has no fever or leukocytosis.  Patient will need outpatient follow-up for repeat noncontrast CT chest in 6 months for further evaluation.   Anemia of chronic disease: Hemoglobin stable, monitor labs.   COPD: Stable, no signs of acute exacerbation.  Does not use any inhalers at home.   CAD status post CABG:  Not endorsing any anginal symptoms and EKG without  acute ischemic changes.  History of CVA PAD Held aspirin  and Plavix  given finding of acute cholecystitis on ultrasound and pending evaluation by general surgery.  Continue Lipitor .  Aspirin  and Plavix  resumed.   Fever secondary to flu A -Temp of 103 noted on the AM of 12/28 - nasal swab pos  for flu A - Pt reporting increased malaise this AM - Started tamiflu  12/29 -Cont supportive care.    DVT prophylaxis: Heparin  sq Code Status: (DNR) Family Communication:No family at bed side Disposition Plan:    Status is: Inpatient Remains inpatient appropriate because:  Patient admitted for hyperkalemia due to missed hemodialysis.  Patient was evaluated by nephrology and started on dialysis.  Patient is medically ready for discharge.  Needs placement in SNF.   Consultants:  Nephrology  Procedures: HD  Antimicrobials:  Anti-infectives (From admission, onward)    Start     Dose/Rate Route Frequency Ordered Stop   05/30/24 0800  oseltamivir  (TAMIFLU ) capsule 30 mg        30 mg Oral Every M-W-F (Hemodialysis) 05/30/24 0750 05/30/24 1032   05/29/24 1200  oseltamivir  (TAMIFLU ) capsule 30 mg  Status:  Discontinued        30 mg Oral Every M-W-F (Hemodialysis) 05/27/24 1227 05/30/24 0750   05/27/24 1230  oseltamivir  (TAMIFLU ) capsule 30 mg        30 mg Oral NOW 05/27/24 1225 05/27/24 1400   05/25/24 1000  cefTRIAXone  (ROCEPHIN ) 2 g in sodium chloride  0.9 % 100 mL IVPB  Status:  Discontinued        2 g 200 mL/hr over 30 Minutes Intravenous Daily 05/25/24 0159 05/29/24 0856   05/25/24 1000  metroNIDAZOLE  (FLAGYL ) IVPB 500 mg  Status:  Discontinued        500 mg 100 mL/hr over 60 Minutes Intravenous Every 12 hours 05/25/24 0159 05/29/24 0857   05/24/24 2300  cefTRIAXone  (ROCEPHIN ) 1 g in sodium chloride  0.9 % 100 mL IVPB        1 g 200 mL/hr over 30 Minutes Intravenous  Once 05/24/24 2258 05/25/24 0117   05/24/24 2300  metroNIDAZOLE  (FLAGYL ) IVPB 500 mg        500 mg 100 mL/hr over 60 Minutes Intravenous  Once 05/24/24 2258 05/25/24 9786      Subjective: Patient was seen and examined at bedside.  Overnight events noted. He is status post hemodialysis today slightly tired.  He denies any concerns. He is on isolation for flu A infection.  On Tamiflu   Objective: Vitals:    05/30/24 0816 05/30/24 1239 05/30/24 1300 05/30/24 1428  BP: (!) 179/74 (!) 188/72 (!) 178/70 (!) 189/78  Pulse: 73 74 72 72  Resp: 15 14 13    Temp: 97.6 F (36.4 C) 98.5 F (36.9 C)    TempSrc: Oral Oral    SpO2: 98% 100% 96% 98%  Weight:      Height:        Intake/Output Summary (Last 24 hours) at 05/30/2024 1550 Last data filed at 05/30/2024 1435 Gross per 24 hour  Intake 480 ml  Output 72 ml  Net 408 ml   Filed Weights   05/24/24 1529 05/25/24 1527 05/27/24 0831  Weight: 61.2 kg 56.3 kg 59.2 kg    Examination:  General exam: Appears calm and comfortable, deconditioned, not in any acute distress. Respiratory system: Clear to auscultation. Respiratory effort normal.  RR 15 Cardiovascular system: S1 & S2 heard, RRR. No JVD, murmurs, rubs, gallops or clicks.  Gastrointestinal system: Abdomen is  non distended, soft and non tender. Normal bowel sounds heard. Central nervous system: Alert and oriented x 3. No focal neurological deficits. Extremities: No edema, no cyanosis, no clubbing. Skin: No rashes, lesions or ulcers Psychiatry: Judgement and insight appear normal. Mood & affect appropriate.     Data Reviewed: I have personally reviewed following labs and imaging studies  CBC: Recent Labs  Lab 05/24/24 1603 05/24/24 2137 05/25/24 0242 05/26/24 0322 05/27/24 0319 05/27/24 1324 05/30/24 0805  WBC 5.3  --  6.1 4.2 4.6 3.6* 3.8*  NEUTROABS 4.2  --   --  2.8  --   --   --   HGB 10.3*   < > 9.4* 8.8* 8.9* 10.4* 10.4*  HCT 32.4*   < > 29.8* 27.3* 27.3* 31.5* 31.2*  MCV 99.4  --  100.0 97.5 95.8 94.3 93.4  PLT 263  --  254 196 195 214 260   < > = values in this interval not displayed.   Basic Metabolic Panel: Recent Labs  Lab 05/26/24 0322 05/26/24 1026 05/27/24 0319 05/27/24 1324 05/30/24 0805 05/30/24 0806  NA 128* 131* 130* 134*  --  134*  K 5.5* 4.9 4.2 3.5  --  3.8  CL 93* 93* 92* 94*  --  94*  CO2 21* 24 23 26   --  28  GLUCOSE 130* 95 67* 47*  --  54*   BUN 50* 51* 58* 25*  --  16  CREATININE 8.08* 8.55* 9.75* 4.82*  --  4.39*  CALCIUM  7.9* 7.9* 8.4* 8.5*  --  8.5*  MG  --   --   --   --  2.1  --   PHOS 5.6*  --   --  3.2 2.8 2.8   GFR: Estimated Creatinine Clearance: 12.2 mL/min (A) (by C-G formula based on SCr of 4.39 mg/dL (H)). Liver Function Tests: Recent Labs  Lab 05/24/24 2109 05/25/24 0242 05/26/24 0322 05/27/24 0319 05/27/24 1324 05/30/24 0806  AST 62* 67*  --  61*  --   --   ALT 26 24  --  18  --   --   ALKPHOS 94 83  --  62  --   --   BILITOT 0.5 0.4  --  0.3  --   --   PROT 7.4 7.0  --  5.8*  --   --   ALBUMIN  4.0 3.8 3.1* 3.3* 3.6 3.6   Recent Labs  Lab 05/24/24 2109  LIPASE 12   No results for input(s): AMMONIA in the last 168 hours. Coagulation Profile: No results for input(s): INR, PROTIME in the last 168 hours. Cardiac Enzymes: No results for input(s): CKTOTAL, CKMB, CKMBINDEX, TROPONINI in the last 168 hours. BNP (last 3 results) No results for input(s): PROBNP in the last 8760 hours. HbA1C: No results for input(s): HGBA1C in the last 72 hours. CBG: Recent Labs  Lab 05/29/24 1959 05/29/24 2359 05/30/24 0806 05/30/24 0916 05/30/24 1239  GLUCAP 84 72 59* 134* 111*   Lipid Profile: No results for input(s): CHOL, HDL, LDLCALC, TRIG, CHOLHDL, LDLDIRECT in the last 72 hours. Thyroid  Function Tests: No results for input(s): TSH, T4TOTAL, FREET4, T3FREE, THYROIDAB in the last 72 hours. Anemia Panel: No results for input(s): VITAMINB12, FOLATE, FERRITIN, TIBC, IRON, RETICCTPCT in the last 72 hours. Sepsis Labs: No results for input(s): PROCALCITON, LATICACIDVEN in the last 168 hours.  Recent Results (from the past 240 hours)  Resp panel by RT-PCR (RSV, Flu A&B, Covid) Anterior Nasal Swab  Status: Abnormal   Collection Time: 05/26/24  8:06 AM   Specimen: Anterior Nasal Swab  Result Value Ref Range Status   SARS Coronavirus 2 by RT PCR  NEGATIVE NEGATIVE Final   Influenza A by PCR POSITIVE (A) NEGATIVE Final   Influenza B by PCR NEGATIVE NEGATIVE Final    Comment: (NOTE) The Xpert Xpress SARS-CoV-2/FLU/RSV plus assay is intended as an aid in the diagnosis of influenza from Nasopharyngeal swab specimens and should not be used as a sole basis for treatment. Nasal washings and aspirates are unacceptable for Xpert Xpress SARS-CoV-2/FLU/RSV testing.  Fact Sheet for Patients: bloggercourse.com  Fact Sheet for Healthcare Providers: seriousbroker.it  This test is not yet approved or cleared by the [place]  FDA and has been authorized for detection and/or diagnosis of SARS-CoV-2 by FDA under an Emergency Use Authorization (EUA). This EUA will remain in effect (meaning this test can be used) for the duration of the COVID-19 declaration under Section 564(b)(1) of the Act, 21 U.S.C. section 360bbb-3(b)(1), unless the authorization is terminated or revoked.     Resp Syncytial Virus by PCR NEGATIVE NEGATIVE Final    Comment: (NOTE) Fact Sheet for Patients: bloggercourse.com  Fact Sheet for Healthcare Providers: seriousbroker.it  This test is not yet approved or cleared by the [place]  FDA and has been authorized for detection and/or diagnosis of SARS-CoV-2 by FDA under an Emergency Use Authorization (EUA). This EUA will remain in effect (meaning this test can be used) for the duration of the COVID-19 declaration under Section 564(b)(1) of the Act, 21 U.S.C. section 360bbb-3(b)(1), unless the authorization is terminated or revoked.  Performed at [facility], [address] [place], [place] [address]   Culture, blood (Routine X 2) w Reflex to ID Panel     Status: None (Preliminary result)   Collection Time: 05/26/24 11:30 AM   Specimen: BLOOD  Result Value Ref Range Status   Specimen Description BLOOD  RIGHT ARM  Final   Special Requests   Final    BOTTLES DRAWN AEROBIC AND ANAEROBIC Blood Culture adequate volume   Culture   Final    NO GROWTH 4 DAYS Performed at [facility], [address] [place], [place] [address]    Report Status PENDING  Incomplete  Culture, blood (Routine X 2) w Reflex to ID Panel     Status: None (Preliminary result)   Collection Time: 05/26/24 11:30 AM   Specimen: BLOOD  Result Value Ref Range Status   Specimen Description BLOOD RIGHT HAND  Final   Special Requests   Final    BOTTLES DRAWN AEROBIC ONLY Blood Culture results may not be optimal due to an inadequate volume of blood received in culture bottles   Culture   Final    NO GROWTH 4 DAYS Performed at [facility], [address], [place], [place] [address]    Report Status PENDING  Incomplete    Radiology Studies: No results found.  Scheduled Meds:  amLODipine   10 mg Oral QHS   atorvastatin   40 mg Oral QHS   clopidogrel   75 mg Oral Daily   darbepoetin (ARANESP ) injection - DIALYSIS  100 mcg Subcutaneous Q Mon-1800   doxercalciferol   3 mcg Intravenous Q M,W,F-HD   hydrALAZINE   100 mg Oral Q8H   insulin  aspart  0-6 Units Subcutaneous Q4H   losartan   100 mg Oral QPM   sevelamer  carbonate  1,600 mg Oral TID WC   Continuous Infusions:  anticoagulant  sodium citrate        LOS: 6 days    Time spent: 35 mins    Darcel Dawley, MD [facility]   If 7PM-7AM, please contact night-coverage  "

## 2024-05-30 NOTE — Progress Notes (Signed)
 Mobility Specialist Progress Note;   05/30/24 0855  Mobility  Activity Pivoted/transferred from bed to chair  Level of Assistance Minimal assist, patient does 75% or more  Assistive Device Front wheel walker  Distance Ambulated (ft) 10 ft  Activity Response Tolerated fair  Mobility Referral Yes  Mobility visit 1 Mobility  Mobility Specialist Start Time (ACUTE ONLY) 0855  Mobility Specialist Stop Time (ACUTE ONLY) 0906  Mobility Specialist Time Calculation (min) (ACUTE ONLY) 11 min   Pt w/ groggy appearance however agreeable to sit up in the char. Required Min/ModA for bed mobility and MinA to safely ambulate around bed to chair. VSS throughout. Requested for warm blanket, provided for pt. Pt left in chair with all needs met, alarm on. Belongings and call bell in reach for pt.   Lauraine Erm Mobility Specialist Please contact via SecureChat or [employer] [phone]

## 2024-05-30 NOTE — Progress Notes (Signed)
 Patient refuses his medications, charge Nurse notified, she came to the room and tried with no success, the patient contact listed on his chart Shawn Collins also helped with no success. I offer the patient fruits, water and Juice but he declines them as well. I will notify the MD and try at a later time.

## 2024-05-31 DIAGNOSIS — E875 Hyperkalemia: Secondary | ICD-10-CM | POA: Diagnosis not present

## 2024-05-31 LAB — CBC
HCT: 32.6 % — ABNORMAL LOW (ref 39.0–52.0)
Hemoglobin: 10.9 g/dL — ABNORMAL LOW (ref 13.0–17.0)
MCH: 31.7 pg (ref 26.0–34.0)
MCHC: 33.4 g/dL (ref 30.0–36.0)
MCV: 94.8 fL (ref 80.0–100.0)
Platelets: 318 K/uL (ref 150–400)
RBC: 3.44 MIL/uL — ABNORMAL LOW (ref 4.22–5.81)
RDW: 15.4 % (ref 11.5–15.5)
WBC: 4.6 K/uL (ref 4.0–10.5)
nRBC: 0.9 % — ABNORMAL HIGH (ref 0.0–0.2)

## 2024-05-31 LAB — BASIC METABOLIC PANEL WITH GFR
Anion gap: 12 (ref 5–15)
BUN: 19 mg/dL (ref 8–23)
CO2: 28 mmol/L (ref 22–32)
Calcium: 8.6 mg/dL — ABNORMAL LOW (ref 8.9–10.3)
Chloride: 93 mmol/L — ABNORMAL LOW (ref 98–111)
Creatinine, Ser: 6.31 mg/dL — ABNORMAL HIGH (ref 0.61–1.24)
GFR, Estimated: 9 mL/min — ABNORMAL LOW
Glucose, Bld: 68 mg/dL — ABNORMAL LOW (ref 70–99)
Potassium: 4 mmol/L (ref 3.5–5.1)
Sodium: 133 mmol/L — ABNORMAL LOW (ref 135–145)

## 2024-05-31 LAB — GLUCOSE, CAPILLARY
Glucose-Capillary: 76 mg/dL (ref 70–99)
Glucose-Capillary: 67 mg/dL — ABNORMAL LOW (ref 70–99)
Glucose-Capillary: 66 mg/dL — ABNORMAL LOW (ref 70–99)
Glucose-Capillary: 100 mg/dL — ABNORMAL HIGH (ref 70–99)
Glucose-Capillary: 102 mg/dL — ABNORMAL HIGH (ref 70–99)
Glucose-Capillary: 77 mg/dL (ref 70–99)

## 2024-05-31 LAB — CULTURE, BLOOD (ROUTINE X 2)
Culture: NO GROWTH
Culture: NO GROWTH
Special Requests: ADEQUATE

## 2024-05-31 LAB — PHOSPHORUS: Phosphorus: 4.2 mg/dL (ref 2.5–4.6)

## 2024-05-31 LAB — MAGNESIUM: Magnesium: 2.3 mg/dL (ref 1.7–2.4)

## 2024-05-31 MED ORDER — LIDOCAINE-PRILOCAINE 2.5-2.5 % EX CREA: 1.0000 | TOPICAL_CREAM | CUTANEOUS | Status: DC | PRN

## 2024-05-31 MED ORDER — LIDOCAINE HCL (PF) 1 % IJ SOLN: 5.0000 mL | INTRAMUSCULAR | Status: DC | PRN

## 2024-05-31 MED ORDER — ANTICOAGULANT SODIUM CITRATE 4% (200MG/5ML) IV SOLN: 5.0000 mL | Status: DC | PRN

## 2024-05-31 MED ORDER — HEPARIN SODIUM (PORCINE) 1000 UNIT/ML DIALYSIS: 1000.0000 [IU] | INTRAMUSCULAR | Status: DC | PRN

## 2024-05-31 MED ORDER — PENTAFLUOROPROP-TETRAFLUOROETH EX AERO: 1.0000 | INHALATION_SPRAY | CUTANEOUS | Status: DC | PRN

## 2024-05-31 MED ORDER — HYDRALAZINE HCL 20 MG/ML IJ SOLN
10.0000 mg | Freq: Once | INTRAMUSCULAR | Status: AC
  Administered 2024-05-31: 10 mg via INTRAVENOUS
  Filled 2024-05-31: qty 1

## 2024-05-31 MED ORDER — HEPARIN SODIUM (PORCINE) 1000 UNIT/ML IJ SOLN
INTRAMUSCULAR | Status: AC
  Filled 2024-05-31: qty 4

## 2024-05-31 NOTE — Progress Notes (Signed)
" °   05/31/24 1220  Vitals  Temp 98.3 F (36.8 C)  Pulse Rate 74  Resp 15  BP (!) 195/80  SpO2 97 %  O2 Device Room Air  Weight 58.9 kg  Type of Weight Post-Dialysis  Oxygen Therapy  Patient Activity (if Appropriate) In bed  Pulse Oximetry Type Continuous  Oximetry Probe Site Changed No  Post Treatment  Dialyzer Clearance Clear  Hemodialysis Intake (mL) 0 mL  Liters Processed 78.9  Fluid Removed (mL) 1500 mL  Tolerated HD Treatment Yes  AVG/AVF Arterial Site Held (minutes) 10 minutes  AVG/AVF Venous Site Held (minutes) 10 minutes   Received patient in bed to unit.  Alert and oriented.  Informed consent signed and in chart.   TX duration:3.5  Patient tolerated well.  Transported back to the room  Alert, without acute distress.  Hand-off given to patient's nurse.   Access used: LLAG Access issues: increased arterial time for cessation of flow--approx 20 minutes  Total UF removed: 1500 Medication(s) given: none   Shawn Collins [facility] "

## 2024-05-31 NOTE — Progress Notes (Signed)
 Pt arrived from ...HD.., A/ox .SABRASABRApt denies any pain, MD aware,CCMD called. CHG bath given,no further needs at this time

## 2024-05-31 NOTE — Progress Notes (Signed)
 Physical Therapy Treatment Patient Details Name: Shawn Collins MRN: [id number] DOB: 11/20/1950 Today's Date: 05/31/2024   History of Present Illness 74 y.o. male presents to [facility] hospital on 05/24/2024 for weakness and hypoglycemia. Pt with trips to ED in previous week and reports he can not take care of himself at home and make it to HD consistently.  Found to have hyperkalemia, in the setting of missed HD and acute acalculous cholecystitis EFY:ZDMI on HD MWF, CAD status post CABG in 2018, COPD, CVA, type 2 diabetes, PAD, hypertension, hyperlipidemia, medication noncompliance.  Patient recently had clotted left forearm graft and was admitted by vascular surgery service and taken to the OR on 12/11 for open thrombectomy of the left forearm AV loop graft and revision of left forearm loop graft with interposition grafting from the venous limb to the basal leg vein.    PT Comments  Patient with limited progress noting fatigue post dialysis.  He was much safer walking with RW than with HHA.  Notes posterior bias initially standing with mod support posteriorly for balance.  PT will continue to follow while admitted.  Continue to recommend post-acute inpatient rehab (<3 hours/day) at d/c.     If plan is discharge home, recommend the following: A little help with walking and/or transfers;A little help with bathing/dressing/bathroom;Assistance with cooking/housework;Direct supervision/assist for medications management;Direct supervision/assist for financial management;Help with stairs or ramp for entrance   Can travel by private vehicle     Yes  Equipment Recommendations  Rolling walker (2 wheels)    Recommendations for Other Services       Precautions / Restrictions Precautions Precautions: Fall Precaution/Restrictions Comments: low vision L eye, very low vision R eye     Mobility  Bed Mobility Overal bed mobility: Needs Assistance Bed Mobility: Supine to Sit     Supine to sit: Min assist, HOB  elevated, Used rails Sit to supine: Min assist   General bed mobility comments: assist for lifting trunk, to supine for positioning legs onto bed    Transfers Overall transfer level: Needs assistance Equipment used: 1 person hand held assist, Rolling walker (2 wheels) Transfers: Sit to/from Stand Sit to Stand: Mod assist, Min assist           General transfer comment: up to stand with HHA only noting posterior bias mod support for balance, second trial with RW and min A    Ambulation/Gait Ambulation/Gait assistance: Min assist, Mod assist Gait Distance (Feet): 20 Feet (x 2) Assistive device: 1 person hand held assist, Rolling walker (2 wheels) Gait Pattern/deviations: Step-to pattern, Step-through pattern, Decreased stride length, Shuffle, Trunk flexed       General Gait Details: poor foot clearance when with HHA only and posterior bias needing support for balance and pt unsteady, with RW improved with min A for walker management and safety   Stairs             Wheelchair Mobility     Tilt Bed    Modified Rankin (Stroke Patients Only)       Balance Overall balance assessment: Needs assistance Sitting-balance support: No upper extremity supported, Feet supported Sitting balance-Leahy Scale: Good   Postural control: Posterior lean Standing balance support: Bilateral upper extremity supported, Single extremity supported Standing balance-Leahy Scale: Poor Standing balance comment: initial posterior lean with A for preventing fall; with RW improved anterior weight shift  Communication Communication Communication: Impaired Factors Affecting Communication: Reduced clarity of speech  Cognition Arousal: Lethargic Behavior During Therapy: Flat affect   PT - Cognitive impairments: Problem solving                       PT - Cognition Comments: slowed processing Following commands: Impaired Following commands  impaired: Follows one step commands with increased time    Cueing    Exercises      General Comments General comments (skin integrity, edema, etc.): assisted to order dinner tray per pt request, NT aware      Pertinent Vitals/Pain Pain Assessment Pain Assessment: No/denies pain    Home Living                          Prior Function            PT Goals (current goals can now be found in the care plan section) Progress towards PT goals: Progressing toward goals    Frequency    Min 2X/week      PT Plan      Co-evaluation              AM-PAC PT 6 Clicks Mobility   Outcome Measure  Help needed turning from your back to your side while in a flat bed without using bedrails?: None Help needed moving from lying on your back to sitting on the side of a flat bed without using bedrails?: A Little Help needed moving to and from a bed to a chair (including a wheelchair)?: A Little Help needed standing up from a chair using your arms (e.g., wheelchair or bedside chair)?: A Lot Help needed to walk in hospital room?: A Lot Help needed climbing 3-5 steps with a railing? : Total 6 Click Score: 15    End of Session Equipment Utilized During Treatment: Gait belt Activity Tolerance: Patient limited by fatigue Patient left: in bed;with call bell/phone within reach;with bed alarm set   PT Visit Diagnosis: Other abnormalities of gait and mobility (R26.89);Other symptoms and signs involving the nervous system (R29.898)     Time: 8454-8392 PT Time Calculation (min) (ACUTE ONLY): 22 min  Charges:    $Gait Training: 8-22 mins PT General Charges $$ ACUTE PT VISIT: 1 Visit                     Micheline Portal, PT Acute Rehabilitation Services Office:[phone] 05/31/2024    Montie Portal 05/31/2024, 5:03 PM

## 2024-05-31 NOTE — Progress Notes (Signed)
 Patient continues to refuse po meds. Unable to convince to take scheduled po hydralazine  but reluctantly accepted PRN IV dose. Attending messaged. Will continue to monitor. BP (!) 193/77 (BP Location: Right Arm)   Pulse 78   Temp 98.3 F (36.8 C)   Resp 16   Ht 5' 10 (1.778 m)   Wt 58.9 kg   SpO2 98%   BMI 18.63 kg/m

## 2024-05-31 NOTE — Progress Notes (Signed)
 " PROGRESS NOTE    Shawn Collins  FMW:[id number] DOB: Jun 29, 1950 DOA: 05/24/2024 PCP: Shelda Atlas, MD   Brief Narrative:  This 74 y.o. male with medical history significant of ESRD on HD MWF, CAD status post CABG in 2018, COPD, CVA, type 2 diabetes, PAD, hypertension, hyperlipidemia, medication noncompliance.  Patient recently had clotted left forearm graft and was admitted by vascular surgery service and taken to the OR on 12/11 for open thrombectomy of the left forearm AV loop graft and revision of left forearm loop graft with interposition grafting from the venous limb to the basal leg vein.  Patient presents to the ED today via EMS for evaluation of generalized weakness and hypoglycemia.  Blood glucose was 53 with EMS and was given D10 with improvement of glucose to 117.  Patient AAO x 4.  Vital signs on arrival to the ED: Temperature 97.5 F, pulse 65, respiratory rate 16, blood pressure 238/89, and SpO2 95-96% on room air.  Labs notable for hemoglobin 10.3 (stable), sodium 133, chloride 93, bicarb 21, anion gap 18, glucose 66, AST 62 but remainder of LFTs normal, lipase normal, UA pending.  Potassium 6.3 on initial BMP but there was concern for possible hemolysis so i-STAT chemistry repeated which confirmed hyperkalemia with potassium of 6.0.  Chest x-ray showing heart size at upper limits of normal and no acute findings.  EKG showing normal sinus rhythm, slightly peaked T waves, and no acute ischemic changes.   Assessment & Plan:   Principal Problem:   Hyperkalemia Active Problems:   ESRD (end stage renal disease) on dialysis (HCC)   Hypertensive urgency   Hypoglycemia   Acute acalculous cholecystitis  Hyperkalemia in the setting of missed hemodialysis: ESRD on HD MWF Insulin  was not given due to hypoglycemia.  Patient was given albuterol , Lokelma , and calcium  gluconate in the ED.   Nephrology consulted and continue HD as per Nephro Held home spironolactone  and losartan . Potassium  normalized.   Acute acalculous cholecystitis noted on ultrasound: No nausea or vomiting.  No abdominal pain or tenderness.  No fever, leukocytosis, or signs of sepsis.   General Surgery consulted. HIDA reviewed, unremarkable. Surgery signed off Plavix  was initially held. Resumed Plavix  now   Hypoglycemia: Type 2 diabetes: Hypoglycemic in the setting of very poor p.o. intake.   Last hemoglobin A1c 5.0 in October 2025.   Glycemic trends improved.   Hypertensive urgency: Due to medication noncompliance.  Continue amlodipine , and hydralazine .  IV hydralazine  PRN SBP >160.   Held spironolactone  and losartan  in the setting of hyperkalemia. SBP currently in the 170-180's. Increased hydralazine  to 75mg  TID on 12/29, may further increase to 100mg  TID if bp remains elevated.   Major Depression: Patient attributes his depression to his chronic vision problems / progressive vision loss for which he is seen by ophthalmology.  -Since he lives alone and is having difficulty taking care of himself, [facility] consulted for nursing home placement.    Left lower lobe peribronchovascular pulmonary nodules: Noted on CT and radiologist feels could be infectious versus inflammatory in etiology.  Infection less likely as he is not endorsing any respiratory symptoms and has no fever or leukocytosis.  Patient will need outpatient follow-up for repeat noncontrast CT chest in 6 months for further evaluation.   Anemia of chronic disease: Hemoglobin stable, monitor labs.   COPD: Stable, no signs of acute exacerbation.  Does not use any inhalers at home.   CAD status post CABG:  Not endorsing any anginal symptoms and EKG  without acute ischemic changes.  History of CVA PAD Held aspirin  and Plavix  given finding of acute cholecystitis on ultrasound and pending evaluation by general surgery.   Continue Lipitor .  Aspirin  and Plavix  resumed.   Fever secondary to flu A -Temp of 103 noted on the AM of 12/28 - nasal  swab pos for flu A - Pt reporting increased malaise this AM - Started tamiflu  12/29 -Cont supportive care.    DVT prophylaxis: Heparin  sq Code Status: (DNR) Family Communication:No family at bed side Disposition Plan:    Status is: Inpatient Remains inpatient appropriate because:  Patient admitted for hyperkalemia due to missed hemodialysis.  Patient was evaluated by nephrology and started on dialysis.  Patient is medically ready for discharge.  Needs placement in SNF.   Consultants:  Nephrology  Procedures: HD  Antimicrobials:  Anti-infectives (From admission, onward)    Start     Dose/Rate Route Frequency Ordered Stop   05/30/24 0800  oseltamivir  (TAMIFLU ) capsule 30 mg        30 mg Oral Every M-W-F (Hemodialysis) 05/30/24 0750 05/30/24 1032   05/29/24 1200  oseltamivir  (TAMIFLU ) capsule 30 mg  Status:  Discontinued        30 mg Oral Every M-W-F (Hemodialysis) 05/27/24 1227 05/30/24 0750   05/27/24 1230  oseltamivir  (TAMIFLU ) capsule 30 mg        30 mg Oral NOW 05/27/24 1225 05/27/24 1400   05/25/24 1000  cefTRIAXone  (ROCEPHIN ) 2 g in sodium chloride  0.9 % 100 mL IVPB  Status:  Discontinued        2 g 200 mL/hr over 30 Minutes Intravenous Daily 05/25/24 0159 05/29/24 0856   05/25/24 1000  metroNIDAZOLE  (FLAGYL ) IVPB 500 mg  Status:  Discontinued        500 mg 100 mL/hr over 60 Minutes Intravenous Every 12 hours 05/25/24 0159 05/29/24 0857   05/24/24 2300  cefTRIAXone  (ROCEPHIN ) 1 g in sodium chloride  0.9 % 100 mL IVPB        1 g 200 mL/hr over 30 Minutes Intravenous  Once 05/24/24 2258 05/25/24 0117   05/24/24 2300  metroNIDAZOLE  (FLAGYL ) IVPB 500 mg        500 mg 100 mL/hr over 60 Minutes Intravenous  Once 05/24/24 2258 05/25/24 9786      Subjective: Patient was seen and examined at bedside.  Overnight events noted. He is status post hemodialysis today,  reports feeling very tired and fatigued.  He denies any concerns. He is on isolation for flu A infection.  On  Tamiflu   Objective: Vitals:   05/31/24 1200 05/31/24 1213 05/31/24 1220 05/31/24 1329  BP: (!) 179/65 (!) 181/76 (!) 195/80 (!) 193/77  Pulse: 69 74 74 78  Resp: 17 14 15 16   Temp:   98.3 F (36.8 C)   TempSrc:      SpO2: 99% 98% 97% 98%  Weight:   58.9 kg   Height:        Intake/Output Summary (Last 24 hours) at 05/31/2024 1359 Last data filed at 05/31/2024 1220 Gross per 24 hour  Intake 240 ml  Output 1600 ml  Net -1360 ml   Filed Weights   05/27/24 0831 05/31/24 0820 05/31/24 1220  Weight: 59.2 kg 60.4 kg 58.9 kg    Examination:  General exam: Appears calm and comfortable, deconditioned, not in any acute distress. Respiratory system: CTA Bilaterally . Respiratory effort normal.  RR 14 Cardiovascular system: S1 & S2 heard, RRR. No JVD, murmurs, rubs, gallops or clicks.  Gastrointestinal system: Abdomen is non distended, soft and non tender. Normal bowel sounds heard. Central nervous system: Alert and oriented x 3. No focal neurological deficits. Extremities: No edema, no cyanosis, no clubbing. Skin: No rashes, lesions or ulcers Psychiatry: Judgement and insight appear normal. Mood & affect appropriate.     Data Reviewed: I have personally reviewed following labs and imaging studies  CBC: Recent Labs  Lab 05/24/24 1603 05/24/24 2137 05/26/24 0322 05/27/24 0319 05/27/24 1324 05/30/24 0805 05/31/24 0251  WBC 5.3   < > 4.2 4.6 3.6* 3.8* 4.6  NEUTROABS 4.2  --  2.8  --   --   --   --   HGB 10.3*   < > 8.8* 8.9* 10.4* 10.4* 10.9*  HCT 32.4*   < > 27.3* 27.3* 31.5* 31.2* 32.6*  MCV 99.4   < > 97.5 95.8 94.3 93.4 94.8  PLT 263   < > 196 195 214 260 318   < > = values in this interval not displayed.   Basic Metabolic Panel: Recent Labs  Lab 05/26/24 0322 05/26/24 1026 05/27/24 0319 05/27/24 1324 05/30/24 0805 05/30/24 0806 05/31/24 0251  NA 128* 131* 130* 134*  --  134* 133*  K 5.5* 4.9 4.2 3.5  --  3.8 4.0  CL 93* 93* 92* 94*  --  94* 93*  CO2 21* 24 23  26   --  28 28  GLUCOSE 130* 95 67* 47*  --  54* 68*  BUN 50* 51* 58* 25*  --  16 19  CREATININE 8.08* 8.55* 9.75* 4.82*  --  4.39* 6.31*  CALCIUM  7.9* 7.9* 8.4* 8.5*  --  8.5* 8.6*  MG  --   --   --   --  2.1  --  2.3  PHOS 5.6*  --   --  3.2 2.8 2.8 4.2   GFR: Estimated Creatinine Clearance: 8.4 mL/min (A) (by C-G formula based on SCr of 6.31 mg/dL (H)). Liver Function Tests: Recent Labs  Lab 05/24/24 2109 05/25/24 0242 05/26/24 0322 05/27/24 0319 05/27/24 1324 05/30/24 0806  AST 62* 67*  --  61*  --   --   ALT 26 24  --  18  --   --   ALKPHOS 94 83  --  62  --   --   BILITOT 0.5 0.4  --  0.3  --   --   PROT 7.4 7.0  --  5.8*  --   --   ALBUMIN  4.0 3.8 3.1* 3.3* 3.6 3.6   Recent Labs  Lab 05/24/24 2109  LIPASE 12   No results for input(s): AMMONIA in the last 168 hours. Coagulation Profile: No results for input(s): INR, PROTIME in the last 168 hours. Cardiac Enzymes: No results for input(s): CKTOTAL, CKMB, CKMBINDEX, TROPONINI in the last 168 hours. BNP (last 3 results) No results for input(s): PROBNP in the last 8760 hours. HbA1C: No results for input(s): HGBA1C in the last 72 hours. CBG: Recent Labs  Lab 05/30/24 1949 05/30/24 2318 05/31/24 0321 05/31/24 0704 05/31/24 1331  GLUCAP 90 92 76 67* 66*   Lipid Profile: No results for input(s): CHOL, HDL, LDLCALC, TRIG, CHOLHDL, LDLDIRECT in the last 72 hours. Thyroid  Function Tests: No results for input(s): TSH, T4TOTAL, FREET4, T3FREE, THYROIDAB in the last 72 hours. Anemia Panel: No results for input(s): VITAMINB12, FOLATE, FERRITIN, TIBC, IRON, RETICCTPCT in the last 72 hours. Sepsis Labs: No results for input(s): PROCALCITON, LATICACIDVEN in the last 168 hours.  Recent  Results (from the past 240 hours)  Resp panel by RT-PCR (RSV, Flu A&B, Covid) Anterior Nasal Swab     Status: Abnormal   Collection Time: 05/26/24  8:06 AM   Specimen: Anterior Nasal  Swab  Result Value Ref Range Status   SARS Coronavirus 2 by RT PCR NEGATIVE NEGATIVE Final   Influenza A by PCR POSITIVE (A) NEGATIVE Final   Influenza B by PCR NEGATIVE NEGATIVE Final    Comment: (NOTE) The Xpert Xpress SARS-CoV-2/FLU/RSV plus assay is intended as an aid in the diagnosis of influenza from Nasopharyngeal swab specimens and should not be used as a sole basis for treatment. Nasal washings and aspirates are unacceptable for Xpert Xpress SARS-CoV-2/FLU/RSV testing.  Fact Sheet for Patients: bloggercourse.com  Fact Sheet for Healthcare Providers: seriousbroker.it  This test is not yet approved or cleared by the [place]  FDA and has been authorized for detection and/or diagnosis of SARS-CoV-2 by FDA under an Emergency Use Authorization (EUA). This EUA will remain in effect (meaning this test can be used) for the duration of the COVID-19 declaration under Section 564(b)(1) of the Act, 21 U.S.C. section 360bbb-3(b)(1), unless the authorization is terminated or revoked.     Resp Syncytial Virus by PCR NEGATIVE NEGATIVE Final    Comment: (NOTE) Fact Sheet for Patients: bloggercourse.com  Fact Sheet for Healthcare Providers: seriousbroker.it  This test is not yet approved or cleared by the [place]  FDA and has been authorized for detection and/or diagnosis of SARS-CoV-2 by FDA under an Emergency Use Authorization (EUA). This EUA will remain in effect (meaning this test can be used) for the duration of the COVID-19 declaration under Section 564(b)(1) of the Act, 21 U.S.C. section 360bbb-3(b)(1), unless the authorization is terminated or revoked.  Performed at [facility], [address] [place], [place] [address]   Culture, blood (Routine X 2) w Reflex to ID Panel     Status: None   Collection Time: 05/26/24 11:30 AM   Specimen: BLOOD  Result Value  Ref Range Status   Specimen Description BLOOD RIGHT ARM  Final   Special Requests   Final    BOTTLES DRAWN AEROBIC AND ANAEROBIC Blood Culture adequate volume   Culture   Final    NO GROWTH 5 DAYS Performed at [facility], [address] [place], [place] [address]    Report Status 05/31/2024 FINAL  Final  Culture, blood (Routine X 2) w Reflex to ID Panel     Status: None   Collection Time: 05/26/24 11:30 AM   Specimen: BLOOD  Result Value Ref Range Status   Specimen Description BLOOD RIGHT HAND  Final   Special Requests   Final    BOTTLES DRAWN AEROBIC ONLY Blood Culture results may not be optimal due to an inadequate volume of blood received in culture bottles   Culture   Final    NO GROWTH 5 DAYS Performed at [facility], [address] [place], [place] [address]    Report Status 05/31/2024 FINAL  Final    Radiology Studies: No results found.  Scheduled Meds:  amLODipine   10 mg Oral QHS   atorvastatin   40 mg Oral QHS   Chlorhexidine  Gluconate Cloth  6 each Topical Q0600   clopidogrel   75 mg Oral Daily   darbepoetin (ARANESP ) injection - DIALYSIS  100 mcg Subcutaneous Q Mon-1800   doxercalciferol   3 mcg Intravenous Q M,W,F-HD   hydrALAZINE   100 mg Oral Q8H   insulin   aspart  0-6 Units Subcutaneous Q4H   losartan   100 mg Oral QPM   sevelamer  carbonate  1,600 mg Oral TID WC   Continuous Infusions:     LOS: 7 days    Time spent: 35 mins    Darcel Dawley, MD [facility]   If 7PM-7AM, please contact night-coverage  "

## 2024-05-31 NOTE — Progress Notes (Signed)
 PT Cancellation Note  Patient Details Name: Shawn Collins MRN: [id number] DOB: 1950/08/22   Cancelled Treatment:    Reason Eval/Treat Not Completed: Patient at procedure or test/unavailable; patient in HD, will follow up later as schedule allows and pt available.    Montie Portal 05/31/2024, 9:55 AM Micheline Portal, PT Acute Rehabilitation Services Office:[phone] 05/31/2024

## 2024-05-31 NOTE — Plan of Care (Signed)

## 2024-05-31 NOTE — Progress Notes (Signed)
 [facility] NEPHROLOGY PROGRESS NOTE  Assessment/ Plan: Pt is a 74 y.o. yo male  with ESRD on HD, HTN, DM, CAD s/p CABG, prior CVA who presented to the ED with weakness and hypoglycemia.   Dialysis Orders:  Unit: [facility] Schedule: MWF Time: 3:45 EDW: 58.6 kg Flows: 450/800 Bath: 2K/2.5Ca  Access: AVG Heparin : None  ESA: Mircera 60 q 2 wks (last 12/17) VDRA: Hectorol  3 q HD   # Hyperkalemia due to missed/incomplete outpatient dialysis as well as home meds losartan  and spironolactone  - resolved with HD and after stopping spironolactone   - tolerating current regimen  - would remain off of spironolactone    # ESRD:  - HD per MWF schedule   # Influenza A infection - Per primary team  - Will need to watch weights as anticipate he may lose weight with acute illness  # HTN/volume: - optimize volume with HD  - remain off of spironolactone  - he is reported as not taking clonidine  and if not compliant would not resume this - see that it is off - he missed both losartan  and amlodipine  on 1/1 - charting as refusing  # Anemia of CKD:  - on weekly Aranesp  100 mcg every Monday   # Secondary hyperparathyroidism/hyperphosphatemia:  - Continue on Renvela  and Hectorol .  # Abdominal pain, fever: HIDA scan normal.  evaluated by general surgery.  On ceftriaxone .  Abx per primary team   # Disposition: Patient said he lives alone and has impaired vision.  Spoke with HD SW and outpatient nephrologist.  Per prior patterns, his outpatient team strongly recommends a SNF which sounds like may need to be a permanent residence for him.  He has had recurrent issues with self care after discharge from SNF in the past per his outpatient team.  He cannot manage well at home    Subjective:   He had 100 mL UOP documented over 1/1.  He completely missed amlodipine  and losartan  on 1/1 - this is charted as being due to patient/family refused.  He states he's just been tired.  Seen and examined on  dialysis.  Procedure supervised.  Blood pressure 165/75 and HR 73.  Tolerating goal.  Left AVG in use.    Reviews of system:        Denies shortness of breath or chest pain  No n/v; some abdominal pain    Objective Vital signs in last 24 hours: Vitals:   05/31/24 0900 05/31/24 1000 05/31/24 1030 05/31/24 1100  BP: (!) 184/77 (!) 178/87 (!) 185/78 (!) 166/72  Pulse: 79 76 73 80  Resp: 19 15 (!) 9 18  Temp:      TempSrc:      SpO2: 97% 98% 95% 100%  Weight:      Height:       Weight change:   Intake/Output Summary (Last 24 hours) at 05/31/2024 1111 Last data filed at 05/30/2024 2320 Gross per 24 hour  Intake 240 ml  Output 100 ml  Net 140 ml        Labs: RENAL PANEL Recent Labs  Lab 05/25/24 0242 05/26/24 0322 05/26/24 1026 05/27/24 0319 05/27/24 1324 05/30/24 0805 05/30/24 0806 05/31/24 0251  NA 131* 128* 131* 130* 134*  --  134* 133*  K 5.5* 5.5* 4.9 4.2 3.5  --  3.8 4.0  CL 92* 93* 93* 92* 94*  --  94* 93*  CO2 17* 21* 24 23 26   --  28 28  GLUCOSE 107* 130* 95 67* 47*  --  54* 68*  BUN 77* 50* 51* 58* 25*  --  16 19  CREATININE 11.00* 8.08* 8.55* 9.75* 4.82*  --  4.39* 6.31*  CALCIUM  8.8* 7.9* 7.9* 8.4* 8.5*  --  8.5* 8.6*  MG  --   --   --   --   --  2.1  --  2.3  PHOS  --  5.6*  --   --  3.2 2.8 2.8 4.2  ALBUMIN  3.8 3.1*  --  3.3* 3.6  --  3.6  --     Liver Function Tests: Recent Labs  Lab 05/24/24 2109 05/25/24 0242 05/26/24 0322 05/27/24 0319 05/27/24 1324 05/30/24 0806  AST 62* 67*  --  61*  --   --   ALT 26 24  --  18  --   --   ALKPHOS 94 83  --  62  --   --   BILITOT 0.5 0.4  --  0.3  --   --   PROT 7.4 7.0  --  5.8*  --   --   ALBUMIN  4.0 3.8   < > 3.3* 3.6 3.6   < > = values in this interval not displayed.   Recent Labs  Lab 05/24/24 2109  LIPASE 12   No results for input(s): AMMONIA in the last 168 hours. CBC: Recent Labs    07/12/23 0630 07/13/23 0808 05/26/24 0322 05/27/24 0319 05/27/24 1324 05/30/24 0805  05/31/24 0251  HGB  --    < > 8.8* 8.9* 10.4* 10.4* 10.9*  MCV  --    < > 97.5 95.8 94.3 93.4 94.8  FERRITIN 821*  --   --   --   --   --   --   TIBC 181*  --   --   --   --   --   --   IRON 22*  --   --   --   --   --   --    < > = values in this interval not displayed.    Cardiac Enzymes: No results for input(s): CKTOTAL, CKMB, CKMBINDEX, TROPONINI in the last 168 hours. CBG: Recent Labs  Lab 05/30/24 1651 05/30/24 1949 05/30/24 2318 05/31/24 0321 05/31/24 0704  GLUCAP 104* 90 92 76 67*    Iron Studies: No results for input(s): IRON, TIBC, TRANSFERRIN, FERRITIN in the last 72 hours. Studies/Results: No results found.   Medications: Infusions:  anticoagulant sodium citrate       Scheduled Medications:  amLODipine   10 mg Oral QHS   atorvastatin   40 mg Oral QHS   Chlorhexidine  Gluconate Cloth  6 each Topical Q0600   clopidogrel   75 mg Oral Daily   darbepoetin (ARANESP ) injection - DIALYSIS  100 mcg Subcutaneous Q Mon-1800   doxercalciferol   3 mcg Intravenous Q M,W,F-HD   hydrALAZINE   100 mg Oral Q8H   insulin  aspart  0-6 Units Subcutaneous Q4H   losartan   100 mg Oral QPM   sevelamer  carbonate  1,600 mg Oral TID WC    have reviewed scheduled and prn medications.  Physical Exam:         General adult male in bed in no acute distress HEENT normocephalic atraumatic extraocular movements intact sclera anicteric Neck supple trachea midline Lungs clear to auscultation bilaterally normal work of breathing at rest  Heart S1S2 no rub  Abdomen soft nontender nondistended Extremities no edema appreciated Psych normal mood and affect Neuro conversant but tired  Access LUE AVG in use  Shawn Collins 05/31/2024,1:04 PM  LOS: 7 days

## 2024-06-01 ENCOUNTER — Other Ambulatory Visit (HOSPITAL_COMMUNITY): Payer: Self-pay

## 2024-06-01 DIAGNOSIS — E875 Hyperkalemia: Secondary | ICD-10-CM | POA: Diagnosis not present

## 2024-06-01 LAB — GLUCOSE, CAPILLARY
Glucose-Capillary: 102 mg/dL — ABNORMAL HIGH (ref 70–99)
Glucose-Capillary: 110 mg/dL — ABNORMAL HIGH (ref 70–99)
Glucose-Capillary: 126 mg/dL — ABNORMAL HIGH (ref 70–99)
Glucose-Capillary: 126 mg/dL — ABNORMAL HIGH (ref 70–99)
Glucose-Capillary: 64 mg/dL — ABNORMAL LOW (ref 70–99)
Glucose-Capillary: 84 mg/dL (ref 70–99)
Glucose-Capillary: 91 mg/dL (ref 70–99)

## 2024-06-01 MED ORDER — HYDRALAZINE HCL 20 MG/ML IJ SOLN
10.0000 mg | INTRAMUSCULAR | Status: AC
  Administered 2024-06-01: 10 mg via INTRAVENOUS
  Filled 2024-06-01: qty 1

## 2024-06-01 MED ORDER — SENNOSIDES-DOCUSATE SODIUM 8.6-50 MG PO TABS
1.0000 | ORAL_TABLET | Freq: Two times a day (BID) | ORAL | Status: DC
  Administered 2024-06-01 – 2024-06-02 (×3): 1 via ORAL
  Filled 2024-06-01 (×3): qty 1

## 2024-06-01 MED ORDER — IRBESARTAN 300 MG PO TABS
300.0000 mg | ORAL_TABLET | Freq: Every day | ORAL | Status: DC
  Administered 2024-06-01: 300 mg via ORAL
  Filled 2024-06-01: qty 1

## 2024-06-01 MED ORDER — HYDRALAZINE HCL 100 MG PO TABS
100.0000 mg | ORAL_TABLET | Freq: Three times a day (TID) | ORAL | 0 refills | Status: DC
  Filled 2024-06-01: qty 90, 30d supply, fill #0

## 2024-06-01 MED ORDER — IRBESARTAN 300 MG PO TABS
300.0000 mg | ORAL_TABLET | Freq: Every day | ORAL | 0 refills | Status: DC
  Filled 2024-06-01: qty 30, 30d supply, fill #0

## 2024-06-01 MED ORDER — POLYETHYLENE GLYCOL 3350 17 G PO PACK
17.0000 g | PACK | Freq: Every day | ORAL | Status: DC
  Administered 2024-06-01 – 2024-06-02 (×2): 17 g via ORAL
  Filled 2024-06-01 (×2): qty 1

## 2024-06-01 MED ORDER — NEPRO/CARBSTEADY PO LIQD
237.0000 mL | Freq: Two times a day (BID) | ORAL | Status: DC
  Administered 2024-06-02: 237 mL via ORAL

## 2024-06-01 NOTE — Progress Notes (Signed)
" °   06/01/24 2014  Vitals  Temp 97.8 F (36.6 C)  Temp Source Oral  BP (!) 218/82  MAP (mmHg) 121  BP Location Right Arm  BP Method Automatic  Patient Position (if appropriate) Lying  Pulse Rate 79  Pulse Rate Source Monitor  ECG Heart Rate 79  Resp 20   convinced pt to take night time meds, Dr Lee who ordered q 2 BP x 2 and to notify  "

## 2024-06-01 NOTE — Progress Notes (Signed)
 " PROGRESS NOTE    Shawn Collins  FMW:[id number] DOB: 12-09-1950 DOA: 05/24/2024 PCP: Shelda Atlas, MD   Brief Narrative:  This 74 y.o. male with medical history significant of ESRD on HD MWF, CAD status post CABG in 2018, COPD, CVA, type 2 diabetes, PAD, hypertension, hyperlipidemia, medication noncompliance.  Patient recently had clotted left forearm graft and was admitted by vascular surgery service and taken to the OR on 12/11 for open thrombectomy of the left forearm AV loop graft and revision of left forearm loop graft with interposition grafting from the venous limb to the basal leg vein.  Patient presents to the ED today via EMS for evaluation of generalized weakness and hypoglycemia.  Blood glucose was 53 with EMS and was given D10 with improvement of glucose to 117.  Patient AAO x 4.  Vital signs on arrival to the ED: Temperature 97.5 F, pulse 65, respiratory rate 16, blood pressure 238/89, and SpO2 95-96% on room air.  Labs notable for hemoglobin 10.3 (stable), sodium 133, chloride 93, bicarb 21, anion gap 18, glucose 66, AST 62 but remainder of LFTs normal, lipase normal, UA pending.  Potassium 6.3 on initial BMP but there was concern for possible hemolysis so i-STAT chemistry repeated which confirmed hyperkalemia with potassium of 6.0.  Chest x-ray showing heart size at upper limits of normal and no acute findings.  EKG showing normal sinus rhythm, slightly peaked T waves, and no acute ischemic changes.   Assessment & Plan:   Principal Problem:   Hyperkalemia Active Problems:   ESRD (end stage renal disease) on dialysis (HCC)   Hypertensive urgency   Hypoglycemia   Acute acalculous cholecystitis  Hyperkalemia in the setting of missed hemodialysis: ESRD on HD MWF Insulin  was not given due to hypoglycemia.  Patient was given albuterol , Lokelma , and calcium  gluconate in the ED.   Nephrology consulted and continue HD as per Nephro Held home spironolactone  and losartan . Potassium  normalized.   Acute acalculous cholecystitis noted on ultrasound: No nausea or vomiting.  No abdominal pain or tenderness.  No fever, leukocytosis, or signs of sepsis.   General Surgery consulted. HIDA reviewed, unremarkable. Surgery signed off Plavix  was initially held. Resumed Plavix  now   Hypoglycemia: Type 2 diabetes: Hypoglycemic in the setting of very poor p.o. intake.   Last hemoglobin A1c 5.0 in October 2025.   Glycemic trends improved.   Hypertensive urgency: Due to medication noncompliance.  Continue amlodipine , and hydralazine .  IV hydralazine  PRN SBP >160.   Held spironolactone  and losartan  in the setting of hyperkalemia. SBP currently in the 170-180's. Increased hydralazine  to 75mg  TID on 12/29, may further increase to 100mg  TID if bp remains elevated.   Major Depression: Patient attributes his depression to his chronic vision problems / progressive vision loss for which he is seen by ophthalmology.  -Since he lives alone and is having difficulty taking care of himself, [facility] consulted for nursing home placement.    Left lower lobe peribronchovascular pulmonary nodules: Noted on CT and radiologist feels could be infectious versus inflammatory in etiology.  Infection less likely as he is not endorsing any respiratory symptoms and has no fever or leukocytosis.  Patient will need outpatient follow-up for repeat noncontrast CT chest in 6 months for further evaluation.   Anemia of chronic disease: Hemoglobin stable, monitor labs.   COPD: Stable, no signs of acute exacerbation.  Does not use any inhalers at home.   CAD status post CABG:  Not endorsing any anginal symptoms and EKG  without acute ischemic changes.  History of CVA PAD Held aspirin  and Plavix  given finding of acute cholecystitis on ultrasound and pending evaluation by general surgery.   Continue Lipitor .  Aspirin  and Plavix  resumed.   Fever secondary to flu A -Temp of 103 noted on the AM of 12/28 - nasal  swab pos for flu A - Pt reporting increased malaise this AM - Started tamiflu  12/29 -Cont supportive care.    DVT prophylaxis: Heparin  sq Code Status: (DNR) Family Communication:No family at bed side Disposition Plan:    Status is: Inpatient Remains inpatient appropriate because:  Patient admitted for hyperkalemia due to missed hemodialysis.  Patient was evaluated by nephrology and started on dialysis.  Patient is medically ready for discharge.  Patient will likely be discharged to SNF tomorrow.   Consultants:  Nephrology  Procedures: HD  Antimicrobials:  Anti-infectives (From admission, onward)    Start     Dose/Rate Route Frequency Ordered Stop   05/30/24 0800  oseltamivir  (TAMIFLU ) capsule 30 mg        30 mg Oral Every M-W-F (Hemodialysis) 05/30/24 0750 05/30/24 1032   05/29/24 1200  oseltamivir  (TAMIFLU ) capsule 30 mg  Status:  Discontinued        30 mg Oral Every M-W-F (Hemodialysis) 05/27/24 1227 05/30/24 0750   05/27/24 1230  oseltamivir  (TAMIFLU ) capsule 30 mg        30 mg Oral NOW 05/27/24 1225 05/27/24 1400   05/25/24 1000  cefTRIAXone  (ROCEPHIN ) 2 g in sodium chloride  0.9 % 100 mL IVPB  Status:  Discontinued        2 g 200 mL/hr over 30 Minutes Intravenous Daily 05/25/24 0159 05/29/24 0856   05/25/24 1000  metroNIDAZOLE  (FLAGYL ) IVPB 500 mg  Status:  Discontinued        500 mg 100 mL/hr over 60 Minutes Intravenous Every 12 hours 05/25/24 0159 05/29/24 0857   05/24/24 2300  cefTRIAXone  (ROCEPHIN ) 1 g in sodium chloride  0.9 % 100 mL IVPB        1 g 200 mL/hr over 30 Minutes Intravenous  Once 05/24/24 2258 05/25/24 0117   05/24/24 2300  metroNIDAZOLE  (FLAGYL ) IVPB 500 mg        500 mg 100 mL/hr over 60 Minutes Intravenous  Once 05/24/24 2258 05/25/24 9786      Subjective: Patient was seen and examined at bedside. Overnight events noted. Patient reports feeling improved but has not had any bowel movement in few days and blood pressure has been remained  elevated.  He denies any concerns. He is on isolation for flu A infection.  On Tamiflu   Objective: Vitals:   06/01/24 0240 06/01/24 0821 06/01/24 1214 06/01/24 1311  BP: (!) 182/80 (!) 195/77 (!) 199/82 (!) 194/80  Pulse: 78 77 76 77  Resp: 18 20 20    Temp: 99.2 F (37.3 C) 98.4 F (36.9 C) 98.5 F (36.9 C)   TempSrc: Oral Oral Oral   SpO2: 100% 96% 94% 97%  Weight:      Height:        Intake/Output Summary (Last 24 hours) at 06/01/2024 1351 Last data filed at 06/01/2024 0000 Gross per 24 hour  Intake 30 ml  Output 50 ml  Net -20 ml   Filed Weights   05/27/24 0831 05/31/24 0820 05/31/24 1220  Weight: 59.2 kg 60.4 kg 58.9 kg    Examination:  General exam: Appears calm and comfortable, deconditioned, not in any acute distress. Respiratory system: CTA Bilaterally . Respiratory effort normal.  RR 15 Cardiovascular system: S1 & S2 heard, RRR. No JVD, murmurs, rubs, gallops or clicks.  Gastrointestinal system: Abdomen is non distended, soft and non tender. Normal bowel sounds heard. Central nervous system: Alert and oriented x 3. No focal neurological deficits. Extremities: No edema, no cyanosis, no clubbing. Skin: No rashes, lesions or ulcers Psychiatry: Judgement and insight appear normal. Mood & affect appropriate.     Data Reviewed: I have personally reviewed following labs and imaging studies  CBC: Recent Labs  Lab 05/26/24 0322 05/27/24 0319 05/27/24 1324 05/30/24 0805 05/31/24 0251  WBC 4.2 4.6 3.6* 3.8* 4.6  NEUTROABS 2.8  --   --   --   --   HGB 8.8* 8.9* 10.4* 10.4* 10.9*  HCT 27.3* 27.3* 31.5* 31.2* 32.6*  MCV 97.5 95.8 94.3 93.4 94.8  PLT 196 195 214 260 318   Basic Metabolic Panel: Recent Labs  Lab 05/26/24 0322 05/26/24 1026 05/27/24 0319 05/27/24 1324 05/30/24 0805 05/30/24 0806 05/31/24 0251  NA 128* 131* 130* 134*  --  134* 133*  K 5.5* 4.9 4.2 3.5  --  3.8 4.0  CL 93* 93* 92* 94*  --  94* 93*  CO2 21* 24 23 26   --  28 28  GLUCOSE 130*  95 67* 47*  --  54* 68*  BUN 50* 51* 58* 25*  --  16 19  CREATININE 8.08* 8.55* 9.75* 4.82*  --  4.39* 6.31*  CALCIUM  7.9* 7.9* 8.4* 8.5*  --  8.5* 8.6*  MG  --   --   --   --  2.1  --  2.3  PHOS 5.6*  --   --  3.2 2.8 2.8 4.2   GFR: Estimated Creatinine Clearance: 8.4 mL/min (A) (by C-G formula based on SCr of 6.31 mg/dL (H)). Liver Function Tests: Recent Labs  Lab 05/26/24 0322 05/27/24 0319 05/27/24 1324 05/30/24 0806  AST  --  61*  --   --   ALT  --  18  --   --   ALKPHOS  --  62  --   --   BILITOT  --  0.3  --   --   PROT  --  5.8*  --   --   ALBUMIN  3.1* 3.3* 3.6 3.6   No results for input(s): LIPASE, AMYLASE in the last 168 hours.  No results for input(s): AMMONIA in the last 168 hours. Coagulation Profile: No results for input(s): INR, PROTIME in the last 168 hours. Cardiac Enzymes: No results for input(s): CKTOTAL, CKMB, CKMBINDEX, TROPONINI in the last 168 hours. BNP (last 3 results) No results for input(s): PROBNP in the last 8760 hours. HbA1C: No results for input(s): HGBA1C in the last 72 hours. CBG: Recent Labs  Lab 06/01/24 0005 06/01/24 0041 06/01/24 0413 06/01/24 0819 06/01/24 1213  GLUCAP 64* 126* 84 91 110*   Lipid Profile: No results for input(s): CHOL, HDL, LDLCALC, TRIG, CHOLHDL, LDLDIRECT in the last 72 hours. Thyroid  Function Tests: No results for input(s): TSH, T4TOTAL, FREET4, T3FREE, THYROIDAB in the last 72 hours. Anemia Panel: No results for input(s): VITAMINB12, FOLATE, FERRITIN, TIBC, IRON, RETICCTPCT in the last 72 hours. Sepsis Labs: No results for input(s): PROCALCITON, LATICACIDVEN in the last 168 hours.  Recent Results (from the past 240 hours)  Resp panel by RT-PCR (RSV, Flu A&B, Covid) Anterior Nasal Swab     Status: Abnormal   Collection Time: 05/26/24  8:06 AM   Specimen: Anterior Nasal Swab  Result Value Ref  Range Status   SARS Coronavirus 2 by RT PCR NEGATIVE  NEGATIVE Final   Influenza A by PCR POSITIVE (A) NEGATIVE Final   Influenza B by PCR NEGATIVE NEGATIVE Final    Comment: (NOTE) The Xpert Xpress SARS-CoV-2/FLU/RSV plus assay is intended as an aid in the diagnosis of influenza from Nasopharyngeal swab specimens and should not be used as a sole basis for treatment. Nasal washings and aspirates are unacceptable for Xpert Xpress SARS-CoV-2/FLU/RSV testing.  Fact Sheet for Patients: bloggercourse.com  Fact Sheet for Healthcare Providers: seriousbroker.it  This test is not yet approved or cleared by the [place]  FDA and has been authorized for detection and/or diagnosis of SARS-CoV-2 by FDA under an Emergency Use Authorization (EUA). This EUA will remain in effect (meaning this test can be used) for the duration of the COVID-19 declaration under Section 564(b)(1) of the Act, 21 U.S.C. section 360bbb-3(b)(1), unless the authorization is terminated or revoked.     Resp Syncytial Virus by PCR NEGATIVE NEGATIVE Final    Comment: (NOTE) Fact Sheet for Patients: bloggercourse.com  Fact Sheet for Healthcare Providers: seriousbroker.it  This test is not yet approved or cleared by the [place]  FDA and has been authorized for detection and/or diagnosis of SARS-CoV-2 by FDA under an Emergency Use Authorization (EUA). This EUA will remain in effect (meaning this test can be used) for the duration of the COVID-19 declaration under Section 564(b)(1) of the Act, 21 U.S.C. section 360bbb-3(b)(1), unless the authorization is terminated or revoked.  Performed at [facility], [address], [place], [place] [address]   Culture, blood (Routine X 2) w Reflex to ID Panel     Status: None   Collection Time: 05/26/24 11:30 AM   Specimen: BLOOD  Result Value Ref Range Status   Specimen Description BLOOD RIGHT ARM  Final   Special  Requests   Final    BOTTLES DRAWN AEROBIC AND ANAEROBIC Blood Culture adequate volume   Culture   Final    NO GROWTH 5 DAYS Performed at [facility], [address], [place], [place] [address]    Report Status 05/31/2024 FINAL  Final  Culture, blood (Routine X 2) w Reflex to ID Panel     Status: None   Collection Time: 05/26/24 11:30 AM   Specimen: BLOOD  Result Value Ref Range Status   Specimen Description BLOOD RIGHT HAND  Final   Special Requests   Final    BOTTLES DRAWN AEROBIC ONLY Blood Culture results may not be optimal due to an inadequate volume of blood received in culture bottles   Culture   Final    NO GROWTH 5 DAYS Performed at [facility], [address], [place], [place] [address]    Report Status 05/31/2024 FINAL  Final    Radiology Studies: No results found.  Scheduled Meds:  amLODipine   10 mg Oral QHS   atorvastatin   40 mg Oral QHS   clopidogrel   75 mg Oral Daily   darbepoetin (ARANESP ) injection - DIALYSIS  100 mcg Subcutaneous Q Mon-1800   doxercalciferol   3 mcg Intravenous Q M,W,F-HD   hydrALAZINE   100 mg Oral Q8H   insulin  aspart  0-6 Units Subcutaneous Q4H   irbesartan   300 mg Oral QHS   polyethylene glycol  17 g Oral Daily   senna-docusate  1 tablet Oral BID   sevelamer  carbonate  1,600 mg Oral TID WC   Continuous Infusions:     LOS: 8 days  Time spent: 35 mins    Darcel Dawley, MD [facility]   If 7PM-7AM, please contact night-coverage  "

## 2024-06-01 NOTE — TOC Progression Note (Signed)
 Transition of Care [facility]) - Progression Note    Patient Details  Name: Shawn Collins MRN: [id number] Date of Birth: 01-02-1951  Transition [facility] CM/SW Contact  Hartley KATHEE Treasure ISRA[place] Phone Number: 06/01/2024, 1:14 PM  Clinical Narrative:     Pt not medically clear for dc, CSW spoke with Glenys at [facility], pt can return when medically clear and dc summary is available.   Expected Discharge Plan: Skilled Nursing Facility Barriers to Discharge:  [facility]  can admit on Friday, after HD)               Expected Discharge Plan and Services In-house Referral: Clinical Social Work     Living arrangements for the past 2 months: Single Family Home Expected Discharge Date: 06/01/24                                     Social Drivers of Health (SDOH) Interventions SDOH Screenings   Food Insecurity: No Food Insecurity (05/29/2024)  Housing: Low Risk (05/29/2024)  Transportation Needs: No Transportation Needs (05/29/2024)  Utilities: Not At Risk (05/29/2024)  Social Connections: Moderately Isolated (05/29/2024)  Tobacco Use: Medium Risk (05/29/2024)    Readmission Risk Interventions    07/07/2022    9:49 AM  Readmission Risk Prevention Plan  Transportation Screening Complete  PCP or Specialist Appt within 3-5 Days Complete  HRI or Home Care Consult Complete  Social Work Consult for Recovery Care Planning/Counseling --  Palliative Care Screening Not Applicable  Medication Review [employer]) Complete

## 2024-06-01 NOTE — Progress Notes (Signed)
 Mobility Specialist Progress Note;   06/01/24 1047  Mobility  Activity Pivoted/transferred from bed to chair  Level of Assistance Moderate assist, patient does 50-74%  Assistive Device Front wheel walker  Distance Ambulated (ft) 5 ft  Activity Response Tolerated fair  Mobility Referral Yes  Mobility visit 1 Mobility  Mobility Specialist Start Time (ACUTE ONLY) 1047  Mobility Specialist Stop Time (ACUTE ONLY) 1055  Mobility Specialist Time Calculation (min) (ACUTE ONLY) 8 min   Pt agreeable to mobility. Seemed a bit groggy in appearance this AM. Required MinA for bed mobility and ModA to stand and pviot from bed to chair. VSS throughout and no c/o when asked. Pt left in chair with all needs met, alarm on.   Lauraine Erm Mobility Specialist Please contact via SecureChat or [employer] [phone]

## 2024-06-01 NOTE — Discharge Summary (Signed)
 Physician Discharge Summary  Shawn Collins FMW:[id number] DOB: Sep 19, 1950 DOA: 05/24/2024  PCP: Shelda Atlas, MD  Admit date: 05/24/2024  Discharge date: 06/02/2024  Admitted From: Home  Disposition:  SNF [facility]  Recommendations for Outpatient Follow-up:  Follow up with PCP in 1-2 weeks. Please obtain BMP/CBC in one week. Advised to follow-up with Nephrology for continuation of hemodialysis. Medication compliance explained in detail.  Home Health: None Equipment/Devices: None  Discharge Condition: Stable CODE STATUS: DNR Diet recommendation: Renal diet  Brief Summary / Hospital Course: This 74 y.o. male with medical history significant of ESRD on HD MWF, CAD status post CABG in 2018, COPD, CVA, type 2 diabetes, PAD, hypertension, hyperlipidemia, medication noncompliance.  Patient recently had clotted left forearm graft and was admitted by vascular surgery service and taken to the OR on 12/11 for open thrombectomy of the left forearm AV loop graft and revision of left forearm loop graft with interposition grafting from the venous limb to the basal leg vein.  Patient presents to the ED  on 12/25 via EMS for evaluation of generalized weakness and hypoglycemia.  Blood glucose was 53 with EMS and was given D10 with improvement of glucose to 117.  Patient AAO x 4.  Vital signs on arrival to the ED: Temperature 97.5 F, pulse 65, respiratory rate 16, blood pressure 238/89, and SpO2 95-96% on room air.  Labs notable for hemoglobin 10.3 (stable), sodium 133, chloride 93, bicarb 21, anion gap 18, glucose 66, AST 62 but remainder of LFTs normal, lipase normal, UA unremarkable .  Potassium 6.3 on initial BMP but there was concern for possible hemolysis so i-STAT chemistry repeated which confirmed hyperkalemia with potassium of 6.0.  Chest x-ray showing heart size at upper limits of normal and no acute findings.  EKG showing normal sinus rhythm, slightly peaked T waves, and no acute  ischemic changes.  Patient was seen by nephrologist,  continued on hemodialysis and potassium normalized.  Blood pressure medications adjusted. Patient is now feeling better and wants to be discharged.  Nephrology signed off , he will continue hemodialysis as an outpatient.   Discharge Diagnoses:  Principal Problem:   Hyperkalemia Active Problems:   ESRD (end stage renal disease) on dialysis (HCC)   Hypertensive urgency   Hypoglycemia   Acute acalculous cholecystitis  Hyperkalemia in the setting of missed hemodialysis: ESRD on HD MWF Insulin  was not given due to hypoglycemia.  Patient was given albuterol , Lokelma , and calcium  gluconate in the ED.   Nephrology consulted and continue HD as per Nephro Held home spironolactone  and losartan . Potassium normalized.   Acute acalculous cholecystitis noted on ultrasound: No nausea or vomiting.  No abdominal pain or tenderness.  No fever, leukocytosis, or signs of sepsis.   General Surgery consulted. HIDA reviewed, unremarkable. Surgery signed off Plavix  was initially held. Resumed Plavix  now.   Hypoglycemia: Type 2 diabetes: Hypoglycemic in the setting of very poor p.o. intake.   Last hemoglobin A1c 5.0 in October 2025.   Glycemic trends improved.   Hypertensive urgency: Due to medication noncompliance.  Continue amlodipine , and hydralazine .  IV hydralazine  PRN SBP >160.   Held spironolactone  and losartan  in the setting of hyperkalemia. SBP currently in the 170-180's. Increased hydralazine  to 75mg  TID on 12/29, may further increase to 100mg  TID if bp remains elevated.   Major Depression: Patient attributes his depression to his chronic vision problems / progressive vision loss for which he is seen by ophthalmology.  -Since he lives alone and is having difficulty  taking care of himself, [facility] consulted for nursing home placement.    Left lower lobe peribronchovascular pulmonary nodules: Noted on CT and radiologist feels could be  infectious versus inflammatory in etiology.  Infection less likely as he is not endorsing any respiratory symptoms and has no fever or leukocytosis.  Patient will need outpatient follow-up for repeat noncontrast CT chest in 6 months for further evaluation.   Anemia of chronic disease: Hemoglobin stable, monitor labs.   COPD: Stable, no signs of acute exacerbation.  Does not use any inhalers at home.   CAD status post CABG:  Not endorsing any anginal symptoms and EKG without acute ischemic changes.   History of CVA PAD Held aspirin  and Plavix  given finding of acute cholecystitis on ultrasound and pending evaluation by general surgery.   Continue Lipitor .  Aspirin  and Plavix  resumed.   Fever secondary to flu A -Temp of 103 noted on the AM of 12/28 - nasal swab pos for flu A -  Completed tamiflu  12/29 -Cont supportive care.  Discharge Instructions  Discharge Instructions     Call MD for:  difficulty breathing, headache or visual disturbances   Complete by: As directed    Call MD for:  persistant dizziness or light-headedness   Complete by: As directed    Call MD for:  persistant nausea and vomiting   Complete by: As directed    Diet renal with fluid restriction   Complete by: As directed    Discharge instructions   Complete by: As directed    Advised to follow-up with primary care physician in 1 week. Advised to follow-up with nephrology for continuation of hemodialysis. Advised to take hydralazine  100 mg 3 times daily.   Discharge wound care:   Complete by: As directed    Follow-up wound care at SNF.   Discharge wound care:   Complete by: As directed    Follow up Wound in SNF.   Increase activity slowly   Complete by: As directed       Allergies as of 06/02/2024   No Known Allergies      Medication List     STOP taking these medications    cloNIDine  0.1 MG tablet Commonly known as: CATAPRES    losartan  100 MG tablet Commonly known as: COZAAR     spironolactone  25 MG tablet Commonly known as: ALDACTONE        TAKE these medications    amLODipine  10 MG tablet Commonly known as: NORVASC  Take 1 tablet (10 mg total) by mouth at bedtime.   atorvastatin  40 MG tablet Commonly known as: LIPITOR  Take 1 tablet (40 mg total) by mouth at bedtime.   clopidogrel  75 MG tablet Commonly known as: PLAVIX  Take 75 mg by mouth daily.   hydrALAZINE  100 MG tablet Commonly known as: APRESOLINE  Take 1 tablet (100 mg total) by mouth every 8 (eight) hours. What changed:  medication strength how much to take   irbesartan  300 MG tablet Commonly known as: AVAPRO  Take 1 tablet (300 mg total) by mouth at bedtime.   nitroGLYCERIN  0.4 MG SL tablet Commonly known as: NITROSTAT  Place 0.4 mg under the tongue every 5 (five) minutes as needed for chest pain.   sevelamer  carbonate 800 MG tablet Commonly known as: RENVELA  Take 800-1,600 mg by mouth See admin instructions. Take 2 tablets by mouth tid daily with meals and 1 tablet bid with snacks               Discharge Care Instructions  (From admission,  onward)           Start     Ordered   06/02/24 0000  Discharge wound care:       Comments: Follow up Wound in SNF.   06/02/24 1105   06/01/24 0000  Discharge wound care:       Comments: Follow-up wound care at [facility].   06/01/24 1103            Contact information for follow-up providers     Shelda Atlas, MD Follow up in 1 week(s).   Specialty: Internal Medicine Contact information: [address] [place] [address] [phone]              Contact information for after-discharge care     Destination     [facility] [place], [place] .   Service: Skilled Nursing Contact information: [address] [place]  [address] [phone]                    Allergies[1]  Consultations: Nephrology   Procedures/Studies: NM Hepatobiliary Liver Func Result Date:  05/25/2024 CLINICAL DATA:  Abdominal pain. EXAM: NUCLEAR MEDICINE HEPATOBILIARY IMAGING TECHNIQUE: Sequential images of the abdomen were obtained out to 60 minutes following intravenous administration of radiopharmaceutical. RADIOPHARMACEUTICALS:  5.2 mCi Tc-77m  Choletec  IV COMPARISON:  None Available. FINDINGS: Prompt uptake and biliary excretion of activity by the liver is seen. Gallbladder activity is visualized, consistent with patency of cystic duct. Gallbladder has a vertically oriented appearance, similar to CT scan from yesterday. Biliary activity passes into small bowel, consistent with patent common bile duct. IMPRESSION: Normal hepatobiliary patency study. Electronically Signed   By: Camellia Candle M.D.   On: 05/25/2024 14:01   US  Abdomen Limited RUQ (LIVER/GB) Result Date: 05/24/2024 EXAM: Right Upper Quadrant Abdominal Ultrasound 05/24/2024 08:54:06 PM TECHNIQUE: Real-time ultrasonography of the right upper quadrant of the abdomen was performed. COMPARISON: CT abdomen and pelvis 05/24/2024. CLINICAL HISTORY: RUQ pain. FINDINGS: LIVER: Normal echogenicity. No intrahepatic biliary ductal dilatation. No evidence of mass. Hepatopetal flow in the portal vein. BILIARY SYSTEM: Gallbladder wall thickening and pericholecystic fluid. No definite evidence of calcified gallstones. Common bile duct is within normal limits measuring  mm. RIGHT KIDNEY: No hydronephrosis. No echogenic calculi. No mass. PANCREAS: Visualized portions of the pancreas are unremarkable. OTHER: Trace a simple free fluid ascites. IMPRESSION: 1. Acute acalculous cholecystitis. 2. Trace simple free fluid ascites. Electronically signed by: Morgane Naveau MD 05/24/2024 10:09 PM EST RP Workstation: [id number]   CT ABDOMEN PELVIS WO CONTRAST Result Date: 05/24/2024 EXAM: CT ABDOMEN AND PELVIS WITHOUT CONTRAST 05/24/2024 05:26:00 PM TECHNIQUE: CT of the abdomen and pelvis was performed without the administration of intravenous contrast.  Multiplanar reformatted images are provided for review. Automated exposure control, iterative reconstruction, and/or weight-based adjustment of the mA/kV was utilized to reduce the radiation dose to as low as reasonably achievable. COMPARISON: None available. CLINICAL HISTORY: Abdominal pain, acute, nonlocalized. FINDINGS: LOWER CHEST: Trace right pleural effusion. Cluster of peribronchovascular pulmonary nodules within the left medial lower lobe with nodules measuring up to 7 x 6 mm. LIVER: The liver is unremarkable. GALLBLADDER AND BILE DUCTS: Gallbladder wall thickening and pericholecystic fluid. No biliary ductal dilatation. SPLEEN: No acute abnormality. PANCREAS: No acute abnormality. ADRENAL GLANDS: No acute abnormality. KIDNEYS, URETERS AND BLADDER: Bilateral kidneys are atrophic. Calcifications associated with the kidneys are likely vascular. No stones in the kidneys or ureters. No hydronephrosis. No perinephric or periureteral stranding. Urinary bladder is unremarkable. GI AND BOWEL: Stomach  demonstrates no acute abnormality. No small or large bowel thickening or dilatation. The appendix is not definitely identified. PERITONEUM AND RETROPERITONEUM: Trace volume abdominal and pelvic simple free fluid ascites. No free air. VASCULATURE: Aorta is normal in caliber. Severe atherosclerotic plaque. LYMPH NODES: No lymphadenopathy. REPRODUCTIVE ORGANS: At least small volume right hydrocele. BONES AND SOFT TISSUES: Small fat-containing umbilical hernia. Trace shallow left inguinal hernia containing fat, free fluid ascites, and the antimesenteric wall of the sigmoid colon (3:66). No acute osseous abnormality. No focal soft tissue abnormality. IMPRESSION: 1. Poorly visualized, suggestion of gallbladder wall thickening and pericholecystic fluid. Recommend correlation with liver function tests. If clinically indicated, further evaluation with right upper quadrant ultrasound for acute cholecystitis. 2. Trace shallow left  inguinal hernia containing fat, ascites, and the antimesenteric wall of the sigmoid colon. No findings to suggest incarceration or obstruction of the bowel. 3. Trace right pleural effusion. 4. 3 cm simple free fluid consistent with ascites. 5. Cluster of left lower lobe peribronchovascular pulmonary nodules measuring up to 7 x 6 mm. Findings suggestive of infectious/inflammation. Follow-up noncontrast chest CT in 6-12 months recommended per Fleischner Society Guidelines (and if high-risk, repeat noncontrast chest CT at 18-24 months). 6. Severe atherosclerotic plaque. 7. Limited evaluation of this noncontrast study. Electronically signed by: Morgane Naveau MD 05/24/2024 07:06 PM EST RP Workstation: [id number]   DG Chest 2 View Result Date: 05/24/2024 EXAM: 2 VIEW(S) XRAY OF THE CHEST 05/24/2024 04:51:00 PM COMPARISON: 03/22/2024 CLINICAL HISTORY: cough FINDINGS: LUNGS AND PLEURA: No focal pulmonary opacity. No pleural effusion. No pneumothorax. HEART AND MEDIASTINUM: Heart size at upper limits of normal. CABG markers noted. Sternotomy wires noted. BONES AND SOFT TISSUES: No acute osseous abnormality. IMPRESSION: 1. No acute findings. 2. Heart size at upper limits of normal. Electronically signed by: Greig Pique MD 05/24/2024 05:52 PM EST RP Workstation: [id number]   CT Head Wo Contrast Result Date: 05/21/2024 EXAM: CT HEAD WITHOUT CONTRAST 05/21/2024 03:49:41 AM TECHNIQUE: CT of the head was performed without the administration of intravenous contrast. Automated exposure control, iterative reconstruction, and/or weight based adjustment of the mA/kV was utilized to reduce the radiation dose to as low as reasonably achievable. COMPARISON: Comparison with 03/22/2024. CLINICAL HISTORY: Neuro deficit, acute, stroke suspected. FINDINGS: BRAIN AND VENTRICLES: No acute hemorrhage. No evidence of acute infarct. No hydrocephalus. No extra-axial collection. No mass effect or midline shift. Chronic microvascular  ischemia and generalized atrophy. ORBITS: No acute abnormality. SINUSES: No acute abnormality. SOFT TISSUES AND SKULL: No acute soft tissue abnormality. No skull fracture. IMPRESSION: 1. No acute intracranial abnormality. Electronically signed by: Norman Gatlin MD 05/21/2024 04:00 AM EST RP Workstation: [id number]     Subjective: Patient was seen and examined at bedside.  Overnight events noted. Patient reports feeling much better and he wants to be discharged to [facility].  Discharge Exam: Vitals:   06/02/24 0250 06/02/24 0849  BP: (!) 160/65 (!) 162/99  Pulse: 73 71  Resp: 16   Temp: 98.2 F (36.8 C) 98.2 F (36.8 C)  SpO2: 98% 97%   Vitals:   06/01/24 2014 06/01/24 2310 06/02/24 0250 06/02/24 0849  BP: (!) 218/82 (!) 154/55 (!) 160/65 (!) 162/99  Pulse: 79 75 73 71  Resp: 20 18 16    Temp: 97.8 F (36.6 C) 98.1 F (36.7 C) 98.2 F (36.8 C) 98.2 F (36.8 C)  TempSrc: Oral Oral Oral Oral  SpO2: 96% 97% 98% 97%  Weight:      Height:        General:  Pt is alert, awake, not in acute distress Cardiovascular: RRR, S1/S2 +, no rubs, no gallops Respiratory: CTA bilaterally, no wheezing, no rhonchi Abdominal: Soft, NT, ND, bowel sounds + Extremities: no edema, no cyanosis    The results of significant diagnostics from this hospitalization (including imaging, microbiology, ancillary and laboratory) are listed below for reference.     Microbiology: Recent Results (from the past 240 hours)  Resp panel by RT-PCR (RSV, Flu A&B, Covid) Anterior Nasal Swab     Status: Abnormal   Collection Time: 05/26/24  8:06 AM   Specimen: Anterior Nasal Swab  Result Value Ref Range Status   SARS Coronavirus 2 by RT PCR NEGATIVE NEGATIVE Final   Influenza A by PCR POSITIVE (A) NEGATIVE Final   Influenza B by PCR NEGATIVE NEGATIVE Final    Comment: (NOTE) The Xpert Xpress SARS-CoV-2/FLU/RSV plus assay is intended as an aid in the diagnosis of influenza from Nasopharyngeal swab  specimens and should not be used as a sole basis for treatment. Nasal washings and aspirates are unacceptable for Xpert Xpress SARS-CoV-2/FLU/RSV testing.  Fact Sheet for Patients: bloggercourse.com  Fact Sheet for Healthcare Providers: seriousbroker.it  This test is not yet approved or cleared by the [place]  FDA and has been authorized for detection and/or diagnosis of SARS-CoV-2 by FDA under an Emergency Use Authorization (EUA). This EUA will remain in effect (meaning this test can be used) for the duration of the COVID-19 declaration under Section 564(b)(1) of the Act, 21 U.S.C. section 360bbb-3(b)(1), unless the authorization is terminated or revoked.     Resp Syncytial Virus by PCR NEGATIVE NEGATIVE Final    Comment: (NOTE) Fact Sheet for Patients: bloggercourse.com  Fact Sheet for Healthcare Providers: seriousbroker.it  This test is not yet approved or cleared by the [place]  FDA and has been authorized for detection and/or diagnosis of SARS-CoV-2 by FDA under an Emergency Use Authorization (EUA). This EUA will remain in effect (meaning this test can be used) for the duration of the COVID-19 declaration under Section 564(b)(1) of the Act, 21 U.S.C. section 360bbb-3(b)(1), unless the authorization is terminated or revoked.  Performed at [facility], [address], [place], [place] [address]   Culture, blood (Routine X 2) w Reflex to ID Panel     Status: None   Collection Time: 05/26/24 11:30 AM   Specimen: BLOOD  Result Value Ref Range Status   Specimen Description BLOOD RIGHT ARM  Final   Special Requests   Final    BOTTLES DRAWN AEROBIC AND ANAEROBIC Blood Culture adequate volume   Culture   Final    NO GROWTH 5 DAYS Performed at [facility], [address], [place], [place] [address]    Report Status 05/31/2024 FINAL  Final  Culture,  blood (Routine X 2) w Reflex to ID Panel     Status: None   Collection Time: 05/26/24 11:30 AM   Specimen: BLOOD  Result Value Ref Range Status   Specimen Description BLOOD RIGHT HAND  Final   Special Requests   Final    BOTTLES DRAWN AEROBIC ONLY Blood Culture results may not be optimal due to an inadequate volume of blood received in culture bottles   Culture   Final    NO GROWTH 5 DAYS Performed at [facility], [address], [place], [place] [address]    Report Status 05/31/2024 FINAL  Final     Labs: BNP (last 3 results) Recent Labs  07/10/23 1302  BNP >4,500.0*   Basic Metabolic Panel: Recent Labs  Lab 05/27/24 0319 05/27/24 1324 05/30/24 0805 05/30/24 0806 05/31/24 0251  NA 130* 134*  --  134* 133*  K 4.2 3.5  --  3.8 4.0  CL 92* 94*  --  94* 93*  CO2 23 26  --  28 28  GLUCOSE 67* 47*  --  54* 68*  BUN 58* 25*  --  16 19  CREATININE 9.75* 4.82*  --  4.39* 6.31*  CALCIUM  8.4* 8.5*  --  8.5* 8.6*  MG  --   --  2.1  --  2.3  PHOS  --  3.2 2.8 2.8 4.2   Liver Function Tests: Recent Labs  Lab 05/27/24 0319 05/27/24 1324 05/30/24 0806  AST 61*  --   --   ALT 18  --   --   ALKPHOS 62  --   --   BILITOT 0.3  --   --   PROT 5.8*  --   --   ALBUMIN  3.3* 3.6 3.6   No results for input(s): LIPASE, AMYLASE in the last 168 hours. No results for input(s): AMMONIA in the last 168 hours. CBC: Recent Labs  Lab 05/27/24 0319 05/27/24 1324 05/30/24 0805 05/31/24 0251  WBC 4.6 3.6* 3.8* 4.6  HGB 8.9* 10.4* 10.4* 10.9*  HCT 27.3* 31.5* 31.2* 32.6*  MCV 95.8 94.3 93.4 94.8  PLT 195 214 260 318   Cardiac Enzymes: No results for input(s): CKTOTAL, CKMB, CKMBINDEX, TROPONINI in the last 168 hours. BNP: Invalid input(s): POCBNP CBG: Recent Labs  Lab 06/01/24 2013 06/02/24 0002 06/02/24 0351 06/02/24 0515 06/02/24 0844  GLUCAP 102* 154* 98 101* 171*   D-Dimer No results for input(s): DDIMER in the last 72 hours. Hgb A1c No  results for input(s): HGBA1C in the last 72 hours. Lipid Profile No results for input(s): CHOL, HDL, LDLCALC, TRIG, CHOLHDL, LDLDIRECT in the last 72 hours. Thyroid  function studies No results for input(s): TSH, T4TOTAL, T3FREE, THYROIDAB in the last 72 hours.  Invalid input(s): FREET3 Anemia work up No results for input(s): VITAMINB12, FOLATE, FERRITIN, TIBC, IRON, RETICCTPCT in the last 72 hours. Urinalysis    Component Value Date/Time   COLORURINE YELLOW 05/24/2024 2109   APPEARANCEUR CLEAR 05/24/2024 2109   LABSPEC 1.011 05/24/2024 2109   PHURINE 7.0 05/24/2024 2109   GLUCOSEU 50 (A) 05/24/2024 2109   HGBUR NEGATIVE 05/24/2024 2109   BILIRUBINUR NEGATIVE 05/24/2024 2109   KETONESUR NEGATIVE 05/24/2024 2109   PROTEINUR 100 (A) 05/24/2024 2109   UROBILINOGEN 1.0 06/27/2010 0631   NITRITE NEGATIVE 05/24/2024 2109   LEUKOCYTESUR NEGATIVE 05/24/2024 2109   Sepsis Labs Recent Labs  Lab 05/27/24 0319 05/27/24 1324 05/30/24 0805 05/31/24 0251  WBC 4.6 3.6* 3.8* 4.6   Microbiology Recent Results (from the past 240 hours)  Resp panel by RT-PCR (RSV, Flu A&B, Covid) Anterior Nasal Swab     Status: Abnormal   Collection Time: 05/26/24  8:06 AM   Specimen: Anterior Nasal Swab  Result Value Ref Range Status   SARS Coronavirus 2 by RT PCR NEGATIVE NEGATIVE Final   Influenza A by PCR POSITIVE (A) NEGATIVE Final   Influenza B by PCR NEGATIVE NEGATIVE Final    Comment: (NOTE) The Xpert Xpress SARS-CoV-2/FLU/RSV plus assay is intended as an aid in the diagnosis of influenza from Nasopharyngeal swab specimens and should not be used as a sole basis for treatment. Nasal washings and aspirates are unacceptable for Xpert Xpress SARS-CoV-2/FLU/RSV  testing.  Fact Sheet for Patients: bloggercourse.com  Fact Sheet for Healthcare Providers: seriousbroker.it  This test is not yet approved or cleared by  the [place]  FDA and has been authorized for detection and/or diagnosis of SARS-CoV-2 by FDA under an Emergency Use Authorization (EUA). This EUA will remain in effect (meaning this test can be used) for the duration of the COVID-19 declaration under Section 564(b)(1) of the Act, 21 U.S.C. section 360bbb-3(b)(1), unless the authorization is terminated or revoked.     Resp Syncytial Virus by PCR NEGATIVE NEGATIVE Final    Comment: (NOTE) Fact Sheet for Patients: bloggercourse.com  Fact Sheet for Healthcare Providers: seriousbroker.it  This test is not yet approved or cleared by the [place]  FDA and has been authorized for detection and/or diagnosis of SARS-CoV-2 by FDA under an Emergency Use Authorization (EUA). This EUA will remain in effect (meaning this test can be used) for the duration of the COVID-19 declaration under Section 564(b)(1) of the Act, 21 U.S.C. section 360bbb-3(b)(1), unless the authorization is terminated or revoked.  Performed at [facility], [address] [place], [place] [address]   Culture, blood (Routine X 2) w Reflex to ID Panel     Status: None   Collection Time: 05/26/24 11:30 AM   Specimen: BLOOD  Result Value Ref Range Status   Specimen Description BLOOD RIGHT ARM  Final   Special Requests   Final    BOTTLES DRAWN AEROBIC AND ANAEROBIC Blood Culture adequate volume   Culture   Final    NO GROWTH 5 DAYS Performed at [facility], [address] [place], [place] [address]    Report Status 05/31/2024 FINAL  Final  Culture, blood (Routine X 2) w Reflex to ID Panel     Status: None   Collection Time: 05/26/24 11:30 AM   Specimen: BLOOD  Result Value Ref Range Status   Specimen Description BLOOD RIGHT HAND  Final   Special Requests   Final    BOTTLES DRAWN AEROBIC ONLY Blood Culture results may not be optimal due to an inadequate volume of blood received in culture bottles    Culture   Final    NO GROWTH 5 DAYS Performed at [facility], [address] [place], [place] [address]    Report Status 05/31/2024 FINAL  Final     Time coordinating discharge: Over 30 minutes  SIGNED:   Darcel Dawley, MD  [facility] 06/02/2024, 11:42 AM Pager   If 7PM-7AM, please contact night-coverage     [1] No Known Allergies

## 2024-06-01 NOTE — Progress Notes (Signed)
" °   06/01/24 0006  Vitals  Temp 98.4 F (36.9 C)  Temp Source Oral  BP (!) 198/79  MAP (mmHg) 112  BP Location Right Arm  BP Method Automatic  Patient Position (if appropriate) Lying  Pulse Rate 78  Pulse Rate Source Monitor  ECG Heart Rate 78  Resp 20   BS also 64 for which 1/2 amp of D50 was given and 10 mg Hydralazine  Given for the blood pressure. Pt continues to refuse to eat and take po meds. Dr Sundil made aware. "

## 2024-06-01 NOTE — Progress Notes (Signed)
 MD aware of elevated BP despite scheduled/prn's given.  Doses for this evening have been adjusted and he would like to see how pt responds before making any further med changes/additions.

## 2024-06-01 NOTE — Progress Notes (Signed)
 Mobility Specialist Progress Note;   06/01/24 1145  Mobility  Activity Ambulated with assistance;Pivoted/transferred from chair to bed (ambulated in room)  Level of Assistance Moderate assist, patient does 50-74%  Assistive Device Front wheel walker  Distance Ambulated (ft) 20 ft  Activity Response Tolerated well  Mobility Referral Yes  Mobility visit 1 Mobility  Mobility Specialist Start Time (ACUTE ONLY) 1145  Mobility Specialist Stop Time (ACUTE ONLY) 1154  Mobility Specialist Time Calculation (min) (ACUTE ONLY) 9 min   RN states pt was requesting to ambulate then return to chair. Pt slid far down in chair upon arrival, requiring assistance to sit up prior to standing. Required ModA to stand from chair, MinA for short distance in room. Pt deferred ambulating in hallway d/t fatigue and requested to return to bed. Pt left comfortably in bed with all needs met, alarm on.   Lauraine Erm Mobility Specialist Please contact via SecureChat or [employer] [phone]

## 2024-06-01 NOTE — Progress Notes (Addendum)
" °  [facility] Progress Note   Subjective:   Seen in room - curled on side, btu answering questions. Denies CP/dyspnea. S/p Hd yesterday with 1.5L off.  Objective Vitals:   05/31/24 1943 06/01/24 0006 06/01/24 0240 06/01/24 0821  BP: (!) 185/74 (!) 198/79 (!) 182/80 (!) 195/77  Pulse: 81 78 78 77  Resp: 20 20 18 20   Temp: 98.3 F (36.8 C) 98.4 F (36.9 C) 99.2 F (37.3 C) 98.4 F (36.9 C)  TempSrc: Oral Oral Oral Oral  SpO2: 100% 98% 100% 96%  Weight:      Height:       Physical Exam General: Frail man, curled on side, room air Heart: RRR Lungs: CTAB, no rales Extremities: no LE edema Dialysis Access: L forearm AVG +bruit  Additional Objective Labs: Basic Metabolic Panel: Recent Labs  Lab 05/27/24 1324 05/30/24 0805 05/30/24 0806 05/31/24 0251  NA 134*  --  134* 133*  K 3.5  --  3.8 4.0  CL 94*  --  94* 93*  CO2 26  --  28 28  GLUCOSE 47*  --  54* 68*  BUN 25*  --  16 19  CREATININE 4.82*  --  4.39* 6.31*  CALCIUM  8.5*  --  8.5* 8.6*  PHOS 3.2 2.8 2.8 4.2   Liver Function Tests: Recent Labs  Lab 05/27/24 0319 05/27/24 1324 05/30/24 0806  AST 61*  --   --   ALT 18  --   --   ALKPHOS 62  --   --   BILITOT 0.3  --   --   PROT 5.8*  --   --   ALBUMIN  3.3* 3.6 3.6   CBC: Recent Labs  Lab 05/26/24 0322 05/27/24 0319 05/27/24 1324 05/30/24 0805 05/31/24 0251  WBC 4.2 4.6 3.6* 3.8* 4.6  NEUTROABS 2.8  --   --   --   --   HGB 8.8* 8.9* 10.4* 10.4* 10.9*  HCT 27.3* 27.3* 31.5* 31.2* 32.6*  MCV 97.5 95.8 94.3 93.4 94.8  PLT 196 195 214 260 318   Medications:   amLODipine   10 mg Oral QHS   atorvastatin   40 mg Oral QHS   clopidogrel   75 mg Oral Daily   darbepoetin (ARANESP ) injection - DIALYSIS  100 mcg Subcutaneous Q Mon-1800   doxercalciferol   3 mcg Intravenous Q M,W,F-HD   hydrALAZINE   100 mg Oral Q8H   insulin  aspart  0-6 Units Subcutaneous Q4H   losartan   100 mg Oral QPM   sevelamer  carbonate  1,600 mg Oral TID WC     Dialysis Orders MWF - [facility] 3:45hr, 450/800, EDW 58.6kg, 2K/2.5Ca bath, AVG, no heparin  - Mircera 60mcg IV q 2 weeks (last 12/17) - Hectoral 3mcg IV q HD  Assessment/Plan: Hyperkalemia: D/t incomplete outpatient HD + meds, off spironolactome Influenza A: S/p Tamiflu . Per primary. ESRD: Continue HD on MWF schedule - next HD 1/5 HTN/volume: BP high, no edema on exam. Change losartan  to irbesartan  at bedtime. Continue hydral 100mg  TID and amlodipine . 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 Continue home binders/VDRA. Nutrition: Alb decent CAD/Hx CABG T2DM: Hypoglycemia initially, better. Dispo: SNF recommended, pending.   Izetta Boehringer, PA-C 06/01/2024, 9:55 AM  [facility]    "

## 2024-06-01 NOTE — Progress Notes (Signed)
" °   06/01/24 0240  Vitals  Temp 99.2 F (37.3 C)  Temp Source Oral  BP (!) 182/80  MAP (mmHg) 109  BP Location Right Arm  BP Method Automatic  Patient Position (if appropriate) Lying  Pulse Rate 78  Pulse Rate Source Monitor  Resp 18   Repeat blood pressure 2 hours after Hydralazine , Dr Lee paged order for additional 10 mg of hydralazine  given  "

## 2024-06-01 NOTE — Discharge Instructions (Signed)
 Advised to follow-up with primary care physician in 1 week. Advised to follow-up with nephrology for continuation of hemodialysis. Advised to take hydralazine  100 mg 3 times daily.

## 2024-06-02 ENCOUNTER — Other Ambulatory Visit (HOSPITAL_COMMUNITY): Payer: Self-pay

## 2024-06-02 DIAGNOSIS — E875 Hyperkalemia: Secondary | ICD-10-CM | POA: Diagnosis not present

## 2024-06-02 LAB — GLUCOSE, CAPILLARY
Glucose-Capillary: 101 mg/dL — ABNORMAL HIGH (ref 70–99)
Glucose-Capillary: 154 mg/dL — ABNORMAL HIGH (ref 70–99)
Glucose-Capillary: 171 mg/dL — ABNORMAL HIGH (ref 70–99)
Glucose-Capillary: 98 mg/dL (ref 70–99)

## 2024-06-02 NOTE — Progress Notes (Signed)
 Mobility Specialist Progress Note;   06/02/24 1104  Mobility  Activity Ambulated with assistance;Pivoted/transferred from bed to chair  Level of Assistance Minimal assist, patient does 75% or more  Assistive Device Front wheel walker  Distance Ambulated (ft) 100 ft  Activity Response Tolerated well  Mobility Referral Yes  Mobility visit 1 Mobility  Mobility Specialist Start Time (ACUTE ONLY) 1104  Mobility Specialist Stop Time (ACUTE ONLY) 1117  Mobility Specialist Time Calculation (min) (ACUTE ONLY) 13 min   Pt pleasant and much more alert this session. Still requiring ModA to stand from lower surface and MinA for safe ambulation. VC required for safety d/t low vision. Pt requested to sit in chair at [facility]. Pt left in chair with all needs met, alarm on. RN notified.   Lauraine Erm Mobility Specialist Please contact via SecureChat or [employer] [phone]

## 2024-06-02 NOTE — TOC Transition Note (Signed)
 Transition of Care [facility]) - Discharge Note   Patient Details  Name: Shawn Collins MRN: [id number] Date of Birth: Sep 17, 1950  Transition of [facility]) CM/SW Contact:  Ermalinda Penton [place], [place] Phone Number: 06/02/2024, 12:03 PM   Clinical Narrative:    Patient to discharge to [facility]. Patient will be going to room 201A. RN to call report TO [phone]. Patient will be transported by [facility]. Patient made aware of discharge today to [facility].  Katye Valek, LCSW Transition of Care    Final next level of care: Skilled Nursing Facility Barriers to Discharge:  [facility]  can admit on Friday, after HD)   Patient Goals and CMS Choice            Discharge Placement                       Discharge Plan and Services Additional resources added to the After Visit Summary for   In-house Referral: Clinical Social Work                                   Social Drivers of Health (SDOH) Interventions SDOH Screenings   Food Insecurity: No Food Insecurity (05/29/2024)  Housing: Low Risk (05/29/2024)  Transportation Needs: No Transportation Needs (05/29/2024)  Utilities: Not At Risk (05/29/2024)  Social Connections: Moderately Isolated (05/29/2024)  Tobacco Use: Medium Risk (05/29/2024)     Readmission Risk Interventions    07/07/2022    9:49 AM  Readmission Risk Prevention Plan  Transportation Screening Complete  PCP or Specialist Appt within 3-5 Days Complete  HRI or Home Care Consult Complete  Social Work Consult for Recovery Care Planning/Counseling --  Palliative Care Screening Not Applicable  Medication Review Oceanographer) Complete

## 2024-06-02 NOTE — Progress Notes (Addendum)
 Orders received to discharge patient.  PIV x1 removed without difficulty.  Appropriate paperwork given to PTAR.  Patient's belongings given to PTAR.  Called report to receiving RN at [facility].

## 2024-06-02 NOTE — Progress Notes (Signed)
" °  [facility] Progress Note   Subjective:   Seen in room - feels ok. No CP/dyspnea. Awaiting SNF for discharge - possibly leaving today.  Objective Vitals:   06/01/24 2014 06/01/24 2310 06/02/24 0250 06/02/24 0849  BP: (!) 218/82 (!) 154/55 (!) 160/65 (!) 162/99  Pulse: 79 75 73 71  Resp: 20 18 16    Temp: 97.8 F (36.6 C) 98.1 F (36.7 C) 98.2 F (36.8 C) 98.2 F (36.8 C)  TempSrc: Oral Oral Oral Oral  SpO2: 96% 97% 98% 97%  Weight:      Height:       Physical Exam General: Frail, but well appearing man, NAD Heart: RRR Lungs: CTAB Abdomen: soft Extremities: no LE edema Dialysis Access: L forearm AVG +t/b  Additional Objective Labs: Basic Metabolic Panel: Recent Labs  Lab 05/27/24 1324 05/30/24 0805 05/30/24 0806 05/31/24 0251  NA 134*  --  134* 133*  K 3.5  --  3.8 4.0  CL 94*  --  94* 93*  CO2 26  --  28 28  GLUCOSE 47*  --  54* 68*  BUN 25*  --  16 19  CREATININE 4.82*  --  4.39* 6.31*  CALCIUM  8.5*  --  8.5* 8.6*  PHOS 3.2 2.8 2.8 4.2   Liver Function Tests: Recent Labs  Lab 05/27/24 0319 05/27/24 1324 05/30/24 0806  AST 61*  --   --   ALT 18  --   --   ALKPHOS 62  --   --   BILITOT 0.3  --   --   PROT 5.8*  --   --   ALBUMIN  3.3* 3.6 3.6   CBC: Recent Labs  Lab 05/27/24 0319 05/27/24 1324 05/30/24 0805 05/31/24 0251  WBC 4.6 3.6* 3.8* 4.6  HGB 8.9* 10.4* 10.4* 10.9*  HCT 27.3* 31.5* 31.2* 32.6*  MCV 95.8 94.3 93.4 94.8  PLT 195 214 260 318   Medications:   amLODipine   10 mg Oral QHS   atorvastatin   40 mg Oral QHS   clopidogrel   75 mg Oral Daily   darbepoetin (ARANESP ) injection - DIALYSIS  100 mcg Subcutaneous Q Mon-1800   doxercalciferol   3 mcg Intravenous Q M,W,F-HD   feeding supplement (NEPRO CARB STEADY)  237 mL Oral BID BM   hydrALAZINE   100 mg Oral Q8H   insulin  aspart  0-6 Units Subcutaneous Q4H   irbesartan   300 mg Oral QHS   polyethylene glycol  17 g Oral Daily   senna-docusate  1 tablet Oral BID    sevelamer  carbonate  1,600 mg Oral TID WC    Dialysis Orders MWF - [facility] 3:45hr, 450/800, EDW 58.6kg, 2K/2.5Ca bath, AVG, no heparin  - Mircera 60mcg IV q 2 weeks (last 12/17) - Hectoral 3mcg IV q HD   Assessment/Plan: Hyperkalemia: D/t incomplete outpatient HD + meds, off spironolactome Influenza A: S/p Tamiflu . Per primary. ESRD: Continue HD on MWF schedule - next HD 1/5 HTN/volume: BP high, no edema on exam. Changed losartan  to irbesartan  at bedtime. Continue hydral 100mg  TID and amlodipine . 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 Continue home binders/VDRA. Nutrition: Alb decent CAD/Hx CABG T2DM: Hypoglycemia initially, better. Dispo: SNF being arranged. Ok to discharge from renal standpoint   Izetta Boehringer, DEVONNA 06/02/2024, 11:27 AM  [facility]    "

## 2024-06-02 NOTE — Discharge Planning (Addendum)
 [facility] Patient Discharge Orders - [facility] CLINIC: [facility]  Patient's name: Shawn Collins Admit/DC Dates: 05/24/2024 - 06/02/2024  DISCHARGE DIAGNOSES: Hyperkalemia  Influenza A HTN Debility  HD ORDER CHANGES: Heparin  change: no EDW Change: no Bath Change: no  ANEMIA MANAGEMENT: Aranesp : Given: no   ESA dose for discharge: Same as prior IV Iron dose at discharge: Per protocol Transfusion: Given: no  BONE/MINERAL MEDICATIONS: Hectorol /Calcitriol change: no Sensipar /Parsabiv change: no  ACCESS INTERVENTION/CHANGE: no Details:   RECENT LABS: Recent Labs  Lab 05/30/24 0806 05/31/24 0251  HGB  --  10.9*  NA 134* 133*  K 3.8 4.0  CALCIUM  8.5* 8.6*  PHOS 2.8 4.2  ALBUMIN  3.6  --     IV ANTIBIOTICS: no Details:  OTHER ANTICOAGULATION: no Details:  OTHER/APPTS/LAB ORDERS: - Meds changed - off spironolactone , losartan  -> irbesartan , hydralazine  increased - follow BP and continue to adjust meds prn - Check K weekly for 1 mo. K very high at start, but lower now esp off spiro - D/c to SNF - [facility]  D/C Meds to be reconciled by nurse after every discharge.  Completed By: Izetta Boehringer, PA-C [facility] Pager [phone]   Reviewed by: MD:______ RN_______

## 2024-06-03 ENCOUNTER — Ambulatory Visit: Attending: Internal Medicine

## 2024-06-03 NOTE — Progress Notes (Signed)
 Late note entry 1/5 0911am  D/c over weekend noted. Contacted out-pt HD clinic [facility] and informed of pt d/c to snf and anticipated arrival back to clinic today. No further support is needed.     Lavanda Hyrum [place] Dialysis Navigator [phone]

## 2024-06-03 NOTE — Progress Notes (Signed)
 Patient discharged, Important Message Letter mailed to patient.

## 2024-06-03 NOTE — Care Management Important Message (Signed)
 Important Message  Patient Details  Name: Shawn Collins MRN: [id number] Date of Birth: 07/20/1950   Important Message Given:  No     [name] 06/03/2024, 9:36 AM

## 2024-06-03 NOTE — Progress Notes (Deleted)
" °  POST OPERATIVE OFFICE NOTE    CC:  F/u for surgery  HPI:  This is a 74 y.o. male who is s/p open thrombectomy of left FA loop AVG, revision of left FA AVG with interposition grafting from the venous limb to the basilic vein with PTFE on 05/09/2024 by Dr. Pearline   Pt states he does *** have pain/numbness in the left hand.    The pt *** on dialysis *** at *** location.   Allergies[1]  Current Outpatient Medications  Medication Sig Dispense Refill   amLODipine  (NORVASC ) 10 MG tablet Take 1 tablet (10 mg total) by mouth at bedtime.     atorvastatin  (LIPITOR ) 40 MG tablet Take 1 tablet (40 mg total) by mouth at bedtime.     clopidogrel  (PLAVIX ) 75 MG tablet Take 75 mg by mouth daily.     hydrALAZINE  (APRESOLINE ) 100 MG tablet Take 1 tablet (100 mg total) by mouth every 8 (eight) hours. 90 tablet 0   irbesartan  (AVAPRO ) 300 MG tablet Take 1 tablet (300 mg total) by mouth at bedtime. 30 tablet 0   nitroGLYCERIN  (NITROSTAT ) 0.4 MG SL tablet Place 0.4 mg under the tongue every 5 (five) minutes as needed for chest pain.     sevelamer  carbonate (RENVELA ) 800 MG tablet Take 800-1,600 mg by mouth See admin instructions. Take 2 tablets by mouth tid daily with meals and 1 tablet bid with snacks     No current facility-administered medications for this visit.     ROS:  See HPI  Physical Exam:  ***  Incision:  *** Extremities:   There *** a palpable *** pulse.   Motor and sensory *** in tact.   There *** a thrill/bruit present.  Access is *** easily palpable    Assessment/Plan:  This is a 74 y.o. male who is s/p: open thrombectomy of left FA loop AVG, revision of left FA AVG with interposition grafting from the venous limb to the basilic vein with PTFE on 05/09/2024 by Dr. Pearline   -the pt does *** have evidence of steal. -pt's access can be used ***. -if pt has tunneled catheter, this can be removed at the discretion of the dialysis center once the pt's access has been  successfully cannulated to their satisfaction.  -discussed with pt that access does not last forever and will need intervention or even new access at some point.  -the pt will follow up ***   Lucie Apt, [facility] [phone]  Clinic MD:  Serene    [1] No Known Allergies  "

## 2024-06-24 ENCOUNTER — Emergency Department (HOSPITAL_COMMUNITY)

## 2024-06-24 ENCOUNTER — Inpatient Hospital Stay (HOSPITAL_COMMUNITY)

## 2024-06-24 ENCOUNTER — Other Ambulatory Visit: Payer: Self-pay

## 2024-06-24 ENCOUNTER — Inpatient Hospital Stay (HOSPITAL_COMMUNITY)
Admission: EM | Admit: 2024-06-24 | Discharge: 2024-07-02 | DRG: 480 | Disposition: A | Discharge status: Skilled Nursing Facility | Source: Skilled Nursing Facility | Attending: Internal Medicine | Admitting: Internal Medicine

## 2024-06-24 ENCOUNTER — Encounter (HOSPITAL_COMMUNITY): Payer: Self-pay | Admitting: Emergency Medicine

## 2024-06-24 DIAGNOSIS — I2489 Other forms of acute ischemic heart disease: Secondary | ICD-10-CM | POA: Diagnosis not present

## 2024-06-24 DIAGNOSIS — Y92122 Bedroom in nursing home as the place of occurrence of the external cause: Secondary | ICD-10-CM

## 2024-06-24 DIAGNOSIS — Z8673 Personal history of transient ischemic attack (TIA), and cerebral infarction without residual deficits: Secondary | ICD-10-CM

## 2024-06-24 DIAGNOSIS — Z8249 Family history of ischemic heart disease and other diseases of the circulatory system: Secondary | ICD-10-CM | POA: Diagnosis not present

## 2024-06-24 DIAGNOSIS — Z823 Family history of stroke: Secondary | ICD-10-CM

## 2024-06-24 DIAGNOSIS — Z87891 Personal history of nicotine dependence: Secondary | ICD-10-CM

## 2024-06-24 DIAGNOSIS — I2510 Atherosclerotic heart disease of native coronary artery without angina pectoris: Secondary | ICD-10-CM | POA: Diagnosis present

## 2024-06-24 DIAGNOSIS — W06XXXA Fall from bed, initial encounter: Secondary | ICD-10-CM | POA: Diagnosis present

## 2024-06-24 DIAGNOSIS — D631 Anemia in chronic kidney disease: Secondary | ICD-10-CM | POA: Diagnosis present

## 2024-06-24 DIAGNOSIS — Z833 Family history of diabetes mellitus: Secondary | ICD-10-CM | POA: Diagnosis not present

## 2024-06-24 DIAGNOSIS — Z7902 Long term (current) use of antithrombotics/antiplatelets: Secondary | ICD-10-CM | POA: Diagnosis not present

## 2024-06-24 DIAGNOSIS — I081 Rheumatic disorders of both mitral and tricuspid valves: Secondary | ICD-10-CM | POA: Diagnosis present

## 2024-06-24 DIAGNOSIS — E43 Unspecified severe protein-calorie malnutrition: Secondary | ICD-10-CM | POA: Diagnosis present

## 2024-06-24 DIAGNOSIS — N186 End stage renal disease: Secondary | ICD-10-CM | POA: Diagnosis present

## 2024-06-24 DIAGNOSIS — S728X1A Other fracture of right femur, initial encounter for closed fracture: Secondary | ICD-10-CM | POA: Diagnosis present

## 2024-06-24 DIAGNOSIS — Z681 Body mass index (BMI) 19 or less, adult: Secondary | ICD-10-CM

## 2024-06-24 DIAGNOSIS — I272 Pulmonary hypertension, unspecified: Secondary | ICD-10-CM | POA: Diagnosis not present

## 2024-06-24 DIAGNOSIS — I5032 Chronic diastolic (congestive) heart failure: Secondary | ICD-10-CM | POA: Diagnosis present

## 2024-06-24 DIAGNOSIS — Z951 Presence of aortocoronary bypass graft: Secondary | ICD-10-CM | POA: Diagnosis not present

## 2024-06-24 DIAGNOSIS — W19XXXA Unspecified fall, initial encounter: Principal | ICD-10-CM

## 2024-06-24 DIAGNOSIS — E785 Hyperlipidemia, unspecified: Secondary | ICD-10-CM | POA: Diagnosis present

## 2024-06-24 DIAGNOSIS — Z992 Dependence on renal dialysis: Secondary | ICD-10-CM | POA: Diagnosis not present

## 2024-06-24 DIAGNOSIS — J449 Chronic obstructive pulmonary disease, unspecified: Secondary | ICD-10-CM | POA: Diagnosis present

## 2024-06-24 DIAGNOSIS — I252 Old myocardial infarction: Secondary | ICD-10-CM

## 2024-06-24 DIAGNOSIS — R55 Syncope and collapse: Secondary | ICD-10-CM | POA: Diagnosis not present

## 2024-06-24 DIAGNOSIS — I10 Essential (primary) hypertension: Secondary | ICD-10-CM | POA: Diagnosis not present

## 2024-06-24 DIAGNOSIS — I132 Hypertensive heart and chronic kidney disease with heart failure and with stage 5 chronic kidney disease, or end stage renal disease: Secondary | ICD-10-CM | POA: Diagnosis present

## 2024-06-24 DIAGNOSIS — I160 Hypertensive urgency: Secondary | ICD-10-CM | POA: Diagnosis present

## 2024-06-24 DIAGNOSIS — I340 Nonrheumatic mitral (valve) insufficiency: Secondary | ICD-10-CM | POA: Diagnosis not present

## 2024-06-24 DIAGNOSIS — S72001A Fracture of unspecified part of neck of right femur, initial encounter for closed fracture: Principal | ICD-10-CM | POA: Diagnosis present

## 2024-06-24 DIAGNOSIS — E1122 Type 2 diabetes mellitus with diabetic chronic kidney disease: Secondary | ICD-10-CM | POA: Diagnosis present

## 2024-06-24 DIAGNOSIS — Z79899 Other long term (current) drug therapy: Secondary | ICD-10-CM

## 2024-06-24 DIAGNOSIS — D62 Acute posthemorrhagic anemia: Secondary | ICD-10-CM | POA: Diagnosis not present

## 2024-06-24 DIAGNOSIS — I071 Rheumatic tricuspid insufficiency: Secondary | ICD-10-CM | POA: Insufficient documentation

## 2024-06-24 DIAGNOSIS — Z66 Do not resuscitate: Secondary | ICD-10-CM | POA: Diagnosis present

## 2024-06-24 LAB — I-STAT VENOUS BLOOD GAS, ED
Acid-Base Excess: 5 mmol/L — ABNORMAL HIGH (ref 0.0–2.0)
Bicarbonate: 28.1 mmol/L — ABNORMAL HIGH (ref 20.0–28.0)
Calcium, Ion: 1.11 mmol/L — ABNORMAL LOW (ref 1.15–1.40)
HCT: 35 % — ABNORMAL LOW (ref 39.0–52.0)
Hemoglobin: 11.9 g/dL — ABNORMAL LOW (ref 13.0–17.0)
O2 Saturation: 93 %
Potassium: 4.3 mmol/L (ref 3.5–5.1)
Sodium: 140 mmol/L (ref 135–145)
TCO2: 29 mmol/L (ref 22–32)
pCO2, Ven: 36.6 mmHg — ABNORMAL LOW (ref 44–60)
pH, Ven: 7.494 — ABNORMAL HIGH (ref 7.25–7.43)
pO2, Ven: 60 mmHg — ABNORMAL HIGH (ref 32–45)

## 2024-06-24 LAB — COMPREHENSIVE METABOLIC PANEL WITH GFR
ALT: 15 U/L (ref 0–44)
AST: 37 U/L (ref 15–41)
Albumin: 4.3 g/dL (ref 3.5–5.0)
Alkaline Phosphatase: 88 U/L (ref 38–126)
Anion gap: 16 — ABNORMAL HIGH (ref 5–15)
BUN: 51 mg/dL — ABNORMAL HIGH (ref 8–23)
CO2: 25 mmol/L (ref 22–32)
Calcium: 9.7 mg/dL (ref 8.9–10.3)
Chloride: 98 mmol/L (ref 98–111)
Creatinine, Ser: 7.83 mg/dL — ABNORMAL HIGH (ref 0.61–1.24)
GFR, Estimated: 7 mL/min — ABNORMAL LOW
Glucose, Bld: 139 mg/dL — ABNORMAL HIGH (ref 70–99)
Potassium: 4.9 mmol/L (ref 3.5–5.1)
Sodium: 139 mmol/L (ref 135–145)
Total Bilirubin: 0.6 mg/dL (ref 0.0–1.2)
Total Protein: 7.5 g/dL (ref 6.5–8.1)

## 2024-06-24 LAB — I-STAT CG4 LACTIC ACID, ED
Lactic Acid, Venous: 0.8 mmol/L (ref 0.5–1.9)
Lactic Acid, Venous: 1 mmol/L (ref 0.5–1.9)

## 2024-06-24 LAB — I-STAT CHEM 8, ED
BUN: 49 mg/dL — ABNORMAL HIGH (ref 8–23)
Calcium, Ion: 1.1 mmol/L — ABNORMAL LOW (ref 1.15–1.40)
Chloride: 101 mmol/L (ref 98–111)
Creatinine, Ser: 8.9 mg/dL — ABNORMAL HIGH (ref 0.61–1.24)
Glucose, Bld: 140 mg/dL — ABNORMAL HIGH (ref 70–99)
HCT: 34 % — ABNORMAL LOW (ref 39.0–52.0)
Hemoglobin: 11.6 g/dL — ABNORMAL LOW (ref 13.0–17.0)
Potassium: 4.3 mmol/L (ref 3.5–5.1)
Sodium: 140 mmol/L (ref 135–145)
TCO2: 26 mmol/L (ref 22–32)

## 2024-06-24 LAB — CBC WITH DIFFERENTIAL/PLATELET
Abs Immature Granulocytes: 0.03 10*3/uL (ref 0.00–0.07)
Basophils Absolute: 0 10*3/uL (ref 0.0–0.1)
Basophils Relative: 0 %
Eosinophils Absolute: 0 10*3/uL (ref 0.0–0.5)
Eosinophils Relative: 0 %
HCT: 33.2 % — ABNORMAL LOW (ref 39.0–52.0)
Hemoglobin: 10.6 g/dL — ABNORMAL LOW (ref 13.0–17.0)
Immature Granulocytes: 0 %
Lymphocytes Relative: 6 %
Lymphs Abs: 0.5 10*3/uL — ABNORMAL LOW (ref 0.7–4.0)
MCH: 31.5 pg (ref 26.0–34.0)
MCHC: 31.9 g/dL (ref 30.0–36.0)
MCV: 98.5 fL (ref 80.0–100.0)
Monocytes Absolute: 0.5 10*3/uL (ref 0.1–1.0)
Monocytes Relative: 7 %
Neutro Abs: 6.1 10*3/uL (ref 1.7–7.7)
Neutrophils Relative %: 87 %
Platelets: 242 10*3/uL (ref 150–400)
RBC: 3.37 MIL/uL — ABNORMAL LOW (ref 4.22–5.81)
RDW: 16.6 % — ABNORMAL HIGH (ref 11.5–15.5)
WBC: 7.2 10*3/uL (ref 4.0–10.5)
nRBC: 0 % (ref 0.0–0.2)

## 2024-06-24 LAB — PROTIME-INR
INR: 1.3 — ABNORMAL HIGH (ref 0.8–1.2)
Prothrombin Time: 16.8 s — ABNORMAL HIGH (ref 11.4–15.2)

## 2024-06-24 LAB — RETICULOCYTES
Immature Retic Fract: 8.5 % (ref 2.3–15.9)
RBC.: 3.39 MIL/uL — ABNORMAL LOW (ref 4.22–5.81)
Retic Count, Absolute: 55.6 10*3/uL (ref 19.0–186.0)
Retic Ct Pct: 1.6 % (ref 0.4–3.1)

## 2024-06-24 LAB — TYPE AND SCREEN
ABO/RH(D): O POS
Antibody Screen: NEGATIVE

## 2024-06-24 LAB — TSH: TSH: 3.49 u[IU]/mL (ref 0.350–4.500)

## 2024-06-24 LAB — LIPASE, BLOOD: Lipase: 27 U/L (ref 11–51)

## 2024-06-24 MED ORDER — AMLODIPINE BESYLATE 5 MG PO TABS
10.0000 mg | ORAL_TABLET | Freq: Once | ORAL | Status: AC
  Administered 2024-06-24: 10 mg via ORAL
  Filled 2024-06-24: qty 2

## 2024-06-24 MED ORDER — SEVELAMER CARBONATE 800 MG PO TABS
1600.0000 mg | ORAL_TABLET | Freq: Three times a day (TID) | ORAL | Status: DC
  Administered 2024-06-26 – 2024-07-02 (×17): 1600 mg via ORAL
  Filled 2024-06-24 (×21): qty 2

## 2024-06-24 MED ORDER — FENTANYL CITRATE (PF) 50 MCG/ML IJ SOSY
25.0000 ug | PREFILLED_SYRINGE | INTRAMUSCULAR | Status: DC | PRN
  Administered 2024-06-25: 25 ug via INTRAVENOUS

## 2024-06-24 MED ORDER — DOXERCALCIFEROL 4 MCG/2ML IV SOLN
1.0000 ug | INTRAVENOUS | Status: DC
  Administered 2024-06-25 – 2024-07-02 (×3): 1 ug via INTRAVENOUS
  Filled 2024-06-24 (×2): qty 2

## 2024-06-24 MED ORDER — HYDROCODONE-ACETAMINOPHEN 5-325 MG PO TABS: 1.0000 | ORAL_TABLET | Freq: Four times a day (QID) | ORAL | Status: DC | PRN

## 2024-06-24 MED ORDER — POLYETHYLENE GLYCOL 3350 17 G PO PACK: 17.0000 g | PACK | Freq: Every day | ORAL | Status: DC | PRN

## 2024-06-24 MED ORDER — HYDROMORPHONE HCL 1 MG/ML IJ SOLN
1.0000 mg | Freq: Once | INTRAMUSCULAR | Status: AC
  Administered 2024-06-24: 1 mg via INTRAVENOUS
  Filled 2024-06-24: qty 1

## 2024-06-24 MED ORDER — MORPHINE SULFATE (PF) 4 MG/ML IV SOLN
4.0000 mg | Freq: Once | INTRAVENOUS | Status: AC
  Administered 2024-06-24: 4 mg via INTRAVENOUS
  Filled 2024-06-24: qty 1

## 2024-06-24 MED ORDER — PANTOPRAZOLE SODIUM 40 MG IV SOLR
40.0000 mg | Freq: Two times a day (BID) | INTRAVENOUS | Status: DC
  Administered 2024-06-24 – 2024-06-26 (×4): 40 mg via INTRAVENOUS
  Filled 2024-06-24 (×4): qty 10

## 2024-06-24 MED ORDER — HYDRALAZINE HCL 20 MG/ML IJ SOLN
10.0000 mg | INTRAMUSCULAR | Status: DC | PRN
  Administered 2024-06-25 – 2024-06-30 (×12): 10 mg via INTRAVENOUS
  Filled 2024-06-24 (×12): qty 1

## 2024-06-24 MED ORDER — SEVELAMER CARBONATE 800 MG PO TABS
800.0000 mg | ORAL_TABLET | ORAL | Status: DC
  Administered 2024-06-25 – 2024-06-29 (×8): 800 mg via ORAL
  Filled 2024-06-24 (×5): qty 1

## 2024-06-24 MED ORDER — IRBESARTAN 300 MG PO TABS
300.0000 mg | ORAL_TABLET | Freq: Every day | ORAL | Status: DC
  Administered 2024-06-24 – 2024-07-02 (×8): 300 mg via ORAL
  Filled 2024-06-24 (×8): qty 1

## 2024-06-24 MED ORDER — SEVELAMER CARBONATE 800 MG PO TABS: 800.0000 mg | ORAL_TABLET | ORAL | Status: DC

## 2024-06-24 MED ORDER — ONDANSETRON HCL 4 MG/2ML IJ SOLN
4.0000 mg | Freq: Once | INTRAMUSCULAR | Status: AC
  Administered 2024-06-24: 4 mg via INTRAVENOUS
  Filled 2024-06-24: qty 2

## 2024-06-24 MED ORDER — METHOCARBAMOL 500 MG PO TABS: 500.0000 mg | ORAL_TABLET | Freq: Four times a day (QID) | ORAL | Status: DC | PRN

## 2024-06-24 MED ORDER — FENTANYL CITRATE (PF) 50 MCG/ML IJ SOSY
50.0000 ug | PREFILLED_SYRINGE | Freq: Once | INTRAMUSCULAR | Status: AC
  Administered 2024-06-24: 50 ug via INTRAVENOUS
  Filled 2024-06-24: qty 1

## 2024-06-24 MED ORDER — CHLORHEXIDINE GLUCONATE CLOTH 2 % EX PADS
6.0000 | MEDICATED_PAD | Freq: Every day | CUTANEOUS | Status: DC
  Administered 2024-06-26 – 2024-07-02 (×7): 6 via TOPICAL

## 2024-06-24 MED ORDER — METHOCARBAMOL 1000 MG/10ML IJ SOLN: 500.0000 mg | Freq: Four times a day (QID) | INTRAMUSCULAR | Status: DC | PRN

## 2024-06-24 NOTE — ED Notes (Signed)
 Patient transported to MRI

## 2024-06-24 NOTE — ED Triage Notes (Signed)
 Pt arrived via [facility] from [facility]. Pt reports falling off his bed onto the floor at 0900. Pt reports that he didn't hit head, no LOC, but is having r-sided hip pain. Pt does report being on a blood thinner. Pt has also had abdominal pain for the past week and had no bowel movements. Pt starting throwing up this morning and is still doing so. Pt is a MWF dialysis pt, is normally compliant but missed today because he was not feeling well.

## 2024-06-24 NOTE — Consult Note (Signed)
 [facility] Renal Consultation Note  Requesting MD: Rocky Massy, MD Indication for Consultation:  ESRD  Chief complaint: fall at SNF and had hip pain   HPI:  Shawn Collins is a 74 y.o. male with a history of end-stage renal disease on hemodialysis, hypertension, coronary artery disease, COPD, diabetes mellitus type 2, and prior CVA who presented to the hospital after a fall at his SNF today.  He fell out of bed and thereafter had right-sided hip pain.  He was brought to the ER via EMS from [facility].  Per charting, did not hit his head and did not have loss of consciousness he was found to have a nondisplaced right femoral neck fracture.  He is usually on a Monday Wednesday Friday schedule for dialysis and did not go to dialysis today because he was having nausea with vomiting that started today.  He tells me that his nausea/vomiting started after the pain from the fall.  Still intermittent now and he has had pain intermittently at rest here.  Nephrology is consulted for assistance with management of end-stage renal disease.  See that orthopedics has been consulted as well.  Note that he also had a CT head without acute intracranial process.  He has gotten a dose of fentanyl  recently and is in considerable pain at rest - they have come to get him for an MRI.     PMHx:   Past Medical History:  Diagnosis Date   CAD (coronary artery disease), native coronary artery    s/p NSTEMI with cath showing severe 3VD s/p  CABG x 3 with LIMA to LAD, SVG to OM1/OM2 and SVG D1 by Dr. Army on 10/26/16.   COPD (chronic obstructive pulmonary disease) (HCC) 09/10/2014   CVA (cerebral vascular accident) (HCC) 12/18/2021   Diabetes mellitus without complication (HCC)    Type II   ESRD (end stage renal disease) (HCC)    Hyperlipidemia LDL goal <70 04/17/2017   Hypertension     Past Surgical History:  Procedure Laterality Date   A/V SHUNT INTERVENTION N/A 08/17/2023   Procedure: A/V  SHUNT INTERVENTION;  Surgeon: Norine Manuelita LABOR, MD;  Location: [facility] INVASIVE CV LAB;  Service: Cardiovascular;  Laterality: N/A;   AV FISTULA PLACEMENT Left 12/21/2021   Procedure: INSERTION OF LEFT ARM ARTERIOVENOUS (AV) GORE-TEX GRAFT;  Surgeon: Eliza Lonni RAMAN, MD;  Location: [facility] OR;  Service: Vascular;  Laterality: Left;   CORONARY ARTERY BYPASS GRAFT N/A 10/26/2016   Procedure: CORONARY ARTERY BYPASS GRAFTING (CABG) x 4 USING LEFT INTERNAL MAMMARY ARTERY TO LAD AND ENDOSCOPICALLY HARVESTED GREATER SAPHENOUS VEIN TO OM 1, 2 AND TO DIAG;  Surgeon: Army Dallas NOVAK, MD;  Location: [facility] OR;  Service: Open Heart Surgery;  Laterality: N/A;   ENDOVEIN HARVEST OF GREATER SAPHENOUS VEIN Right 10/26/2016   Procedure: ENDOVEIN HARVEST OF GREATER SAPHENOUS VEIN;  Surgeon: Army Dallas NOVAK, MD;  Location: [facility] OR;  Service: Open Heart Surgery;  Laterality: Right;   IR THORACENTESIS RIGHT ASP PLEURAL SPACE W/IMG GUIDE  11/03/2016   LEFT HEART CATH AND CORONARY ANGIOGRAPHY N/A 10/21/2016   Procedure: Left Heart Cath and Coronary Angiography;  Surgeon: Jordan, Peter M, MD;  Location: [facility] INVASIVE CV LAB;  Service: Cardiovascular;  Laterality: N/A;   TEE WITHOUT CARDIOVERSION N/A 10/26/2016   Procedure: TRANSESOPHAGEAL ECHOCARDIOGRAM (TEE);  Surgeon: Army Dallas NOVAK, MD;  Location: [facility] OR;  Service: Open Heart Surgery;  Laterality: N/A;   THROMBECTOMY W/ EMBOLECTOMY Left 05/09/2024   Procedure: THROMBECTOMY AND REVISION OF  LEFT FOREARM ARTERIOVENOUS GORE-TEX GRAFT, USING GORE-TEX 7 MM STRETCH AS INTERPOSITION GRAFT;  Surgeon: Pearline Norman RAMAN, MD;  Location: [facility] OR;  Service: Vascular;  Laterality: Left;    Family Hx:  Family History  Problem Relation Age of Onset   Diabetes Mellitus II Mother    Stroke Father    Hypertension Father    Diabetes Mellitus II Sister     Social History:  reports that he quit smoking about 54 years ago. His smoking use included cigarettes. He started smoking about 62 years  ago. He has a 2 pack-year smoking history. He has never used smokeless tobacco. He reports that he does not currently use alcohol. He reports that he does not currently use drugs.  Allergies: Allergies[1] No known drug allergies    Medications: Prior to Admission medications  Medication Sig Start Date End Date Taking? Authorizing Provider  amLODipine  (NORVASC ) 10 MG tablet Take 1 tablet (10 mg total) by mouth at bedtime. 07/20/23   Rojelio Nest, DO  atorvastatin  (LIPITOR ) 40 MG tablet Take 1 tablet (40 mg total) by mouth at bedtime. 07/20/23   Rojelio Nest, DO  clopidogrel  (PLAVIX ) 75 MG tablet Take 75 mg by mouth daily. 01/14/24   [provider]  hydrALAZINE  (APRESOLINE ) 100 MG tablet Take 1 tablet (100 mg total) by mouth every 8 (eight) hours. 06/01/24 07/01/24  Leotis Bogus, MD  irbesartan  (AVAPRO ) 300 MG tablet Take 1 tablet (300 mg total) by mouth at bedtime. 06/01/24 07/01/24  Leotis Bogus, MD  nitroGLYCERIN  (NITROSTAT ) 0.4 MG SL tablet Place 0.4 mg under the tongue every 5 (five) minutes as needed for chest pain.    [provider]  sevelamer  carbonate (RENVELA ) 800 MG tablet Take 800-1,600 mg by mouth See admin instructions. Take 2 tablets by mouth tid daily with meals and 1 tablet bid with snacks 04/02/24   [provider]   I have reviewed the patient's current and reported prior to admission medications.   Labs:     Latest Ref Rng & Units 06/24/2024    2:54 PM 06/24/2024    2:53 PM 06/24/2024    2:47 PM  BMP  Glucose 70 - 99 mg/dL  859  860   BUN 8 - 23 mg/dL  49  51   Creatinine 9.38 - 1.24 mg/dL  1.09  2.16   Sodium 864 - 145 mmol/L 140  140  139   Potassium 3.5 - 5.1 mmol/L 4.3  4.3  4.9   Chloride 98 - 111 mmol/L  101  98   CO2 22 - 32 mmol/L   25   Calcium  8.9 - 10.3 mg/dL   9.7      ROS:  Pertinent items noted in HPI and remainder of comprehensive ROS otherwise negative.  Physical Exam: Vitals:   06/24/24 1615 06/24/24 1715  BP: (!)  200/75 (!) 196/82  Pulse: 73 75  Resp: 14 14  Temp:    SpO2: 98% 99%     General:  elderly male in stretcher in discomfort   HEENT: NCAT Eyes: EOMI sclera anicteric Neck: supple trachea midline  Heart: S1S2 no rub Lungs: clear to auscultation; normal work of breathing at rest on room air Abdomen:  soft/nt/nd Extremities: no pitting edema appreciated; no cyanosis or clubbing Skin: no rash on extremities exposed Neuro:  awake and conversant; provides hx and follows commands  Psych: no anxiety or agitation Access LUE AVG with bruit    Outpatient dialysis orders: [facility] MWF  2k/2.5 ca BF 450 ml/min DF 800 ml/min EDW 57.6 kg  Profile 2 3 hrs and 45 minutes Access:  AV graft Meds: mircera 50 mcg every 2 weeks (last given 06/12/24); venofer 50 mg weekly Hectorol  1 mcg three times a week   Assessment/Plan:   # ESRD  - HD per MWF schedule usually - We are unable to add him on to tonight but will perform HD on Tuesday, 1/27  - Then transition to MWF schedule as staffing permits   # Right femoral neck fracture  - Please avoid morphine  for pain due to ESRD (toxic metabolites accumulate) - If an IV agent is needed, would recommend fentanyl  or dilaudid .  ER attending has just ordered a one-time-only dose of dilaudid  to try and make it through the MRI - Please avoid any continuous fluids  # HTN  - Hypertensive with pain as well as known HTN - optimize volume status with HD   - pain control per primary team  - continue home meds as tolerated per primary team   # Anemia of CKD  - Hb acceptable - no ESA indicated   # Metabolic bone disease  - resume home renvela  when taking PO - ortho to assess him  - resume hectorol   - phos in AM    Thank you for the consult.  Please do not hesitate to contact me with any questions regarding our patient   Katheryn JAYSON Saba 06/24/2024, 7:17 PM         [1] No Known Allergies

## 2024-06-24 NOTE — ED Notes (Signed)
 Called and placed PT on monitor with CCMD

## 2024-06-24 NOTE — ED Provider Notes (Signed)
 " [facility] Provider Note   CSN: [id number] Arrival date & time: 06/24/24  1425     Patient presents with: fall/sepsis? and Fall   Shawn Collins is a 74 y.o. male.  {Add pertinent medical, surgical, social history, OB history to HPI:3343} 74 year old male with prior medical history as detailed below presents for evaluation.  Patient arrives with EMS transport from [place].  He reports a fall this morning around 9:00 morning.  Patient is not 100% sure that he did not hit his head.  He denies associated LOC.  He complains of right sided hip pain after the fall.  Patient is on dialysis.  His last dialysis was Friday.  He is typically a Monday Wednesday Friday dialysis patient.  Patient is on Plavix .  He complains of nausea and vomiting over the morning after the fall.  He complains of diffuse abdominal discomfort.  Abdominal pain has been present for the last week.  He reports decreased bowel movements.  EMS reported a temperature of 99.4 orally.  The history is provided by the patient, medical records and the EMS personnel.       Prior to Admission medications  Medication Sig Start Date End Date Taking? Authorizing Provider  amLODipine  (NORVASC ) 10 MG tablet Take 1 tablet (10 mg total) by mouth at bedtime. 07/20/23   Rojelio Nest, DO  atorvastatin  (LIPITOR ) 40 MG tablet Take 1 tablet (40 mg total) by mouth at bedtime. 07/20/23   Rojelio Nest, DO  clopidogrel  (PLAVIX ) 75 MG tablet Take 75 mg by mouth daily. 01/14/24   [provider]  hydrALAZINE  (APRESOLINE ) 100 MG tablet Take 1 tablet (100 mg total) by mouth every 8 (eight) hours. 06/01/24 07/01/24  Leotis Bogus, MD  irbesartan  (AVAPRO ) 300 MG tablet Take 1 tablet (300 mg total) by mouth at bedtime. 06/01/24 07/01/24  Leotis Bogus, MD  nitroGLYCERIN  (NITROSTAT ) 0.4 MG SL tablet Place 0.4 mg under the tongue every 5 (five) minutes as needed for chest pain.    [provider]  sevelamer  carbonate (RENVELA ) 800 MG tablet Take 800-1,600 mg by mouth See admin instructions. Take 2 tablets by mouth tid daily with meals and 1 tablet bid with snacks 04/02/24   [provider]    Allergies: Patient has no known allergies.    Review of Systems  All other systems reviewed and are negative.   Updated Vital Signs Temp (!) 97.2 F (36.2 C) (Oral)   Physical Exam Vitals and nursing note reviewed.  Constitutional:      General: He is not in acute distress.    Appearance: He is well-developed.  HENT:     Head: Normocephalic and atraumatic.  Eyes:     Conjunctiva/sclera: Conjunctivae normal.  Cardiovascular:     Rate and Rhythm: Normal rate and regular rhythm.     Heart sounds: No murmur heard. Pulmonary:     Effort: Pulmonary effort is normal. No respiratory distress.     Breath sounds: Normal breath sounds.  Abdominal:     Palpations: Abdomen is soft.     Tenderness: There is abdominal tenderness.  Musculoskeletal:        General: No swelling.     Cervical back: Neck supple.     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  Palpable thrill present.  Skin:    General: Skin is warm and dry.     Capillary Refill: Capillary refill takes less than 2 seconds.  Neurological:  Mental Status: He is alert.  Psychiatric:        Mood and Affect: Mood normal.     (all labs ordered are listed, but only abnormal results are displayed) Labs Reviewed  CBC WITH DIFFERENTIAL/PLATELET  PROTIME-INR  COMPREHENSIVE METABOLIC PANEL WITH GFR  LIPASE, BLOOD  URINALYSIS, W/ REFLEX TO CULTURE (INFECTION SUSPECTED)  I-STAT CHEM 8, ED  I-STAT CG4 LACTIC ACID, ED  I-STAT VENOUS BLOOD GAS, ED  TYPE AND SCREEN    EKG: None  Radiology: No results found.  {Document cardiac monitor, telemetry assessment procedure when appropriate:32947} Procedures   Medications Ordered in the ED - No data to display    {Click here for ABCD2,  HEART and other calculators REFRESH Note before signing:1}                              Medical Decision Making Amount and/or Complexity of Data Reviewed Labs: ordered. Radiology: ordered.   ***  {Document critical care time when appropriate  Document review of labs and clinical decision tools ie CHADS2VASC2, etc  Document your independent review of radiology images and any outside records  Document your discussion with family members, caretakers and with consultants  Document social determinants of health affecting pt's care  Document your decision making why or why not admission, treatments were needed:32947:::1}   Final diagnoses:  None    ED Discharge Orders     None        "

## 2024-06-24 NOTE — ED Provider Notes (Signed)
" °  Physical Exam  BP (!) 189/73 (BP Location: Right Arm)   Pulse 78   Temp (!) 97.2 F (36.2 C) (Oral)   Resp (!) 28   SpO2 99%   Physical Exam  Procedures  Procedures  ED Course / MDM    Medical Decision Making Amount and/or Complexity of Data Reviewed Labs: ordered. Radiology: ordered.  Risk Prescription drug management. Decision regarding hospitalization.   ESRD, missed dialysis today, fell, right hip pain, on plavix . Also vomiting since the fall. CT pending.  CT completed and evaluated by me and radiology shows evidence of a right femoral neck fracture, no signs of additional trauma.  Other incidental findings including dependent atelectasis, pleural effusions, lung nodule, cholelithiasis, enlarged prostate, enlarged pulmonary trunk.  CT head and cervical spine without acute findings.  Does not have a specific right upper quadrant tenderness and have low suspicion for symptomatic cholelithiasis or occult cholecystitis.  Potassium is within normal limits.  Discussed with Dr. [facility] and Dr. Jerrye of Nephrology. Will admit to Dr. Tobie hospitalist for further care.       Dreama Longs, MD 06/24/24 1940  "

## 2024-06-24 NOTE — ED Notes (Signed)
 Pt states he only urinates about every 2 days. Unable to urinate at this time

## 2024-06-24 NOTE — Progress Notes (Signed)
 Consult received from EDP.  Pertinent imaging reviewed which shows a nondisplaced femoral neck fracture.  Based on findings, plan is for surgical repair tomorrow pending medical optimization.  Surgery is scheduled for 4 pm Tuesday.  Patient may need to be dialyzed tomorrow morning but will defer that to primary/renal team.  Will see patient in the morning for full consult.  GEANNIE Ozell Cummins, MD [facility] 6:05 PM

## 2024-06-24 NOTE — H&P (Addendum)
 " History and Physical    Patient: Lenon Kuennen FMW:[id number] DOB: 08/29/1950 DOA: 06/24/2024 DOS: the patient was seen and examined on 06/24/2024 . PCP: Shelda Atlas, MD  Patient coming from: [place].  Chief complaint: Chief Complaint  Patient presents with   fall/sepsis?   Fall   HPI:  Bevin Mayall is a 74 y.o. male with past medical history  of  ESRD on M/W/F and missed today's HD session due to weakness and fall and coming to hospital after falling out of bed. No dizziness  and no other complaints. He somehow fell out of bed at about 9 am this morning.Pt also request to be ventilated but no chest compression. Pt is awake and oriented but is limited historian. Per RN note pt did have nausea and vomiting. He was recently discharged on jan 4th after being managed for Flu A, hyperkalemia with stable dialysis, acute acalculous cholecystitis that was evaluated by general surgery and after HIDA they signed off, Hypertensive urgency,hypoglycemia. Pt at bedside is alert and stable.  ED Course:  Vital signs in the ED were notable for the following: Hypertensive/ afebrile /o2 sat  Vitals:   06/24/24 1442 06/24/24 1615 06/24/24 1715 06/24/24 1830  BP: (!) 189/73 (!) 200/75 (!) 196/82 (!) 187/70  Pulse: 78 73 75 69  Temp:      Resp: (!) 28 14 14 15   SpO2: 99% 98% 99% 91%  TempSrc:       >>ED evaluation thus far shows: -Venous blood gas shows pH of 7.494 pCO2 of 36.6 pO2 of 60. -Initial CMP shows potassium 4.9 repeat at 4.3 glucose 139 BUN of 51 creatinine 7.83 anion gap of 16 normal LFTs. -Initial CBC shows white count of 7.2 hemoglobin of 10.6 platelets of 242. -Initial head CT noncontrast is negative for any acute intracranial abnormality and atrophy and chronic small vessel ischemic changes present, CT C-spine also shows straightening of the C-spine with advanced degenerative changes no acute osseous abnormality. -Initial chest x-ray shows no active disease mild cardiomegaly. -EKG  today shows sinus rhythm 76 PR 161 QTc 452 nonspecific T wave abnormalities in lateral leads.  >>While in the ED patient received the following: Medications  fentaNYL  (SUBLIMAZE ) injection 50 mcg (has no administration in time range)  ondansetron  (ZOFRAN ) injection 4 mg (4 mg Intravenous Given 06/24/24 1458)  morphine  (PF) 4 MG/ML injection 4 mg (4 mg Intravenous Given 06/24/24 1459)   Review of Systems  Musculoskeletal:  Positive for falls and joint pain.  All other systems reviewed and are negative.  Past Medical History:  Diagnosis Date   CAD (coronary artery disease), native coronary artery    s/p NSTEMI with cath showing severe 3VD s/p  CABG x 3 with LIMA to LAD, SVG to OM1/OM2 and SVG D1 by Dr. Army on 10/26/16.   COPD (chronic obstructive pulmonary disease) (HCC) 09/10/2014   CVA (cerebral vascular accident) (HCC) 12/18/2021   Diabetes mellitus without complication (HCC)    Type II   ESRD (end stage renal disease) (HCC)    Hyperlipidemia LDL goal <70 04/17/2017   Hypertension    Past Surgical History:  Procedure Laterality Date   A/V SHUNT INTERVENTION N/A 08/17/2023   Procedure: A/V SHUNT INTERVENTION;  Surgeon: Norine Manuelita LABOR, MD;  Location: [facility] INVASIVE CV LAB;  Service: Cardiovascular;  Laterality: N/A;   AV FISTULA PLACEMENT Left 12/21/2021   Procedure: INSERTION OF LEFT ARM ARTERIOVENOUS (AV) GORE-TEX GRAFT;  Surgeon: Eliza Lonni RAMAN, MD;  Location: [facility] OR;  Service: Vascular;  Laterality: Left;   CORONARY ARTERY BYPASS GRAFT N/A 10/26/2016   Procedure: CORONARY ARTERY BYPASS GRAFTING (CABG) x 4 USING LEFT INTERNAL MAMMARY ARTERY TO LAD AND ENDOSCOPICALLY HARVESTED GREATER SAPHENOUS VEIN TO OM 1, 2 AND TO DIAG;  Surgeon: Army Dallas NOVAK, MD;  Location[facility] OR;  Service: Open Heart Surgery;  Laterality: N/A;   ENDOVEIN HARVEST OF GREATER SAPHENOUS VEIN Right 10/26/2016   Procedure: ENDOVEIN HARVEST OF GREATER SAPHENOUS VEIN;  Surgeon: Army Dallas NOVAK, MD;   Location[facility] OR;  Service: Open Heart Surgery;  Laterality: Right;   IR THORACENTESIS RIGHT ASP PLEURAL SPACE W/IMG GUIDE  11/03/2016   LEFT HEART CATH AND CORONARY ANGIOGRAPHY N/A 10/21/2016   Procedure: Left Heart Cath and Coronary Angiography;  Surgeon: Jordan, Peter M, MD;  Location: [facility] INVASIVE CV LAB;  Service: Cardiovascular;  Laterality: N/A;   TEE WITHOUT CARDIOVERSION N/A 10/26/2016   Procedure: TRANSESOPHAGEAL ECHOCARDIOGRAM (TEE);  Surgeon: Army Dallas NOVAK, MD;  Location: [facility] OR;  Service: Open Heart Surgery;  Laterality: N/A;   THROMBECTOMY W/ EMBOLECTOMY Left 05/09/2024   Procedure: THROMBECTOMY AND REVISION OF LEFT FOREARM ARTERIOVENOUS GORE-TEX GRAFT, USING GORE-TEX 7 MM STRETCH AS INTERPOSITION GRAFT;  Surgeon: Pearline Norman RAMAN, MD;  Location: [facility] OR;  Service: Vascular;  Laterality: Left;    reports that he quit smoking about 54 years ago. His smoking use included cigarettes. He started smoking about 62 years ago. He has a 2 pack-year smoking history. He has never used smokeless tobacco. He reports that he does not currently use alcohol. He reports that he does not currently use drugs. Allergies[1] Family History  Problem Relation Age of Onset   Diabetes Mellitus II Mother    Stroke Father    Hypertension Father    Diabetes Mellitus II Sister    Prior to Admission medications  Medication Sig Start Date End Date Taking? Authorizing Provider  amLODipine  (NORVASC ) 10 MG tablet Take 1 tablet (10 mg total) by mouth at bedtime. 07/20/23   Rojelio Nest, DO  atorvastatin  (LIPITOR ) 40 MG tablet Take 1 tablet (40 mg total) by mouth at bedtime. 07/20/23   Rojelio Nest, DO  clopidogrel  (PLAVIX ) 75 MG tablet Take 75 mg by mouth daily. 01/14/24   [provider]  hydrALAZINE  (APRESOLINE ) 100 MG tablet Take 1 tablet (100 mg total) by mouth every 8 (eight) hours. 06/01/24 07/01/24  Leotis Bogus, MD  irbesartan  (AVAPRO ) 300 MG tablet Take 1 tablet (300 mg total) by mouth at bedtime.  06/01/24 07/01/24  Leotis Bogus, MD  nitroGLYCERIN  (NITROSTAT ) 0.4 MG SL tablet Place 0.4 mg under the tongue every 5 (five) minutes as needed for chest pain.    [provider]  sevelamer  carbonate (RENVELA ) 800 MG tablet Take 800-1,600 mg by mouth See admin instructions. Take 2 tablets by mouth tid daily with meals and 1 tablet bid with snacks 04/02/24   [provider]                                                                                 Vitals:   06/24/24 1442 06/24/24 1615 06/24/24 1715 06/24/24 1830  BP: (!) 189/73 (!) 200/75 (!) 196/82 (!) 187/70  Pulse: 78  73 75 69  Resp: (!) 28 14 14 15   Temp:      TempSrc:      SpO2: 99% 98% 99% 91%   Physical Exam Vitals reviewed.  Constitutional:      General: He is not in acute distress.    Appearance: He is not ill-appearing.  HENT:     Head: Normocephalic.  Eyes:     Extraocular Movements: Extraocular movements intact.  Cardiovascular:     Rate and Rhythm: Normal rate and regular rhythm.     Heart sounds: Normal heart sounds.  Pulmonary:     Breath sounds: Normal breath sounds.  Abdominal:     General: There is no distension.     Palpations: Abdomen is soft.     Tenderness: There is no abdominal tenderness.  Neurological:     General: No focal deficit present.     Mental Status: He is alert and oriented to person, place, and time.     Labs on Admission: I have personally reviewed following labs and imaging studies CBC: Recent Labs  Lab 06/24/24 1447 06/24/24 1453 06/24/24 1454  WBC 7.2  --   --   NEUTROABS 6.1  --   --   HGB 10.6* 11.6* 11.9*  HCT 33.2* 34.0* 35.0*  MCV 98.5  --   --   PLT 242  --   --    Basic Metabolic Panel: Recent Labs  Lab 06/24/24 1447 06/24/24 1453 06/24/24 1454  NA 139 140 140  K 4.9 4.3 4.3  CL 98 101  --   CO2 25  --   --   GLUCOSE 139* 140*  --   BUN 51* 49*  --   CREATININE 7.83* 8.90*  --   CALCIUM  9.7  --   --    GFR: CrCl cannot be calculated  (Unknown ideal weight.). Liver Function Tests: Recent Labs  Lab 06/24/24 1447  AST 37  ALT 15  ALKPHOS 88  BILITOT 0.6  PROT 7.5  ALBUMIN  4.3   Recent Labs  Lab 06/24/24 1447  LIPASE 27   No results for input(s): AMMONIA in the last 168 hours. Recent Labs    05/24/24 2137 05/25/24 0242 05/26/24 0322 05/26/24 1026 05/27/24 0319 05/27/24 1324 05/30/24 0806 05/31/24 0251 06/24/24 1447 06/24/24 1453  BUN 85* 77* 50* 51* 58* 25* 16 19 51* 49*  CREATININE 12.10* 11.00* 8.08* 8.55* 9.75* 4.82* 4.39* 6.31* 7.83* 8.90*   Urine analysis:    Component Value Date/Time   COLORURINE YELLOW 05/24/2024 2109   APPEARANCEUR CLEAR 05/24/2024 2109   LABSPEC 1.011 05/24/2024 2109   PHURINE 7.0 05/24/2024 2109   GLUCOSEU 50 (A) 05/24/2024 2109   HGBUR NEGATIVE 05/24/2024 2109   BILIRUBINUR NEGATIVE 05/24/2024 2109   KETONESUR NEGATIVE 05/24/2024 2109   PROTEINUR 100 (A) 05/24/2024 2109   UROBILINOGEN 1.0 06/27/2010 0631   NITRITE NEGATIVE 05/24/2024 2109   LEUKOCYTESUR NEGATIVE 05/24/2024 2109   Radiological Exams on Admission: CT Head Wo Contrast Result Date: 06/24/2024 CLINICAL DATA:  Head trauma EXAM: CT HEAD WITHOUT CONTRAST TECHNIQUE: Contiguous axial images were obtained from the base of the skull through the vertex without intravenous contrast. RADIATION DOSE REDUCTION: This exam was performed according to the departmental dose-optimization program which includes automated exposure control, adjustment of the mA and/or kV according to patient size and/or use of iterative reconstruction technique. COMPARISON:  05/21/2024 FINDINGS: Brain: No acute territorial infarction, hemorrhage or intracranial mass. Atrophy and chronic small vessel ischemic changes of the  white matter. 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 The ventricles are stable in size. Vascular: No hyperdense vessels. Vertebral and carotid vascular calcification Skull: Normal. Negative for fracture or  focal lesion. Sinuses/Orbits: No acute finding. Other: None IMPRESSION: 1. No CT evidence for acute intracranial abnormality. 2. Atrophy and chronic small vessel ischemic changes of the white matter. Electronically Signed   By: Luke Bun M.D.   On: 06/24/2024 16:26   DG Hip Unilat W or Wo Pelvis 2-3 Views Right Result Date: 06/24/2024 CLINICAL DATA:  Fall EXAM: DG HIP (WITH OR WITHOUT PELVIS) 2-3V RIGHT COMPARISON:  CT 06/24/2024 FINDINGS: SI joints are non widened. Vascular calcifications. Pubic symphysis and rami appear intact. Acute nondisplaced right femoral neck fracture. No femoral head dislocation. IMPRESSION: Acute nondisplaced right femoral neck fracture. Electronically Signed   By: Luke Bun M.D.   On: 06/24/2024 16:23   DG Chest 1 View Result Date: 06/24/2024 CLINICAL DATA:  Shortness of breath EXAM: CHEST  1 VIEW COMPARISON:  05/24/2024 FINDINGS: Post sternotomy changes. Mild cardiomegaly. No acute airspace disease, pleural effusion or pneumothorax. IMPRESSION: No active disease. Mild cardiomegaly. Electronically Signed   By: Luke Bun M.D.   On: 06/24/2024 16:21   CT Cervical Spine Wo Contrast Result Date: 06/24/2024 CLINICAL DATA:  Neck trauma fall EXAM: CT CERVICAL SPINE WITHOUT CONTRAST TECHNIQUE: Multidetector CT imaging of the cervical spine was performed without intravenous contrast. Multiplanar CT image reconstructions were also generated. RADIATION DOSE REDUCTION: This exam was performed according to the departmental dose-optimization program which includes automated exposure control, adjustment of the mA and/or kV according to patient size and/or use of iterative reconstruction technique. COMPARISON:  None Available. FINDINGS: Alignment: Straightening of the cervical spine. No subluxation. Facet alignment is normal Skull base and vertebrae: No acute fracture. No primary bone lesion or focal pathologic process. Soft tissues and spinal canal: No prevertebral fluid or  swelling. No visible canal hematoma. Disc levels: Advanced disc space narrowing and degenerative change C2 through C7. Multilevel facet degenerative change and foraminal narrowing. Interbody ankylosis C4-C5 with fusion of the right greater than left facets at this level. No high-grade bony canal stenosis Upper chest: Negative. Other: None IMPRESSION: Straightening of the cervical spine with advanced degenerative changes. No acute osseous abnormality. Electronically Signed   By: Luke Bun M.D.   On: 06/24/2024 16:20   CT CHEST ABDOMEN PELVIS WO CONTRAST Result Date: 06/24/2024 CLINICAL DATA:  Sepsis, fall from bed, right-sided hip pain. On blood thinners. EXAM: CT CHEST, ABDOMEN AND PELVIS WITHOUT CONTRAST TECHNIQUE: Multidetector CT imaging of the chest, abdomen and pelvis was performed following the standard protocol without IV contrast. RADIATION DOSE REDUCTION: This exam was performed according to the departmental dose-optimization program which includes automated exposure control, adjustment of the mA and/or kV according to patient size and/or use of iterative reconstruction technique. COMPARISON:  CT abdomen pelvis 05/24/2024 and CT chest 02/10/2024. FINDINGS: CT CHEST FINDINGS Cardiovascular: Atherosclerotic calcification of the aorta, aortic valve and coronary arteries. Enlarged pulmonic trunk and heart. No pericardial effusion. Mediastinum/Nodes: No pathologically enlarged mediastinal or axillary lymph nodes. Hilar regions are difficult to definitively evaluate without IV contrast. Bilateral gynecomastia. Esophagus is grossly unremarkable. Lungs/Pleura: Image quality is degraded by expiratory phase imaging and respiratory motion. Mild dependent atelectasis bilaterally with small bilateral pleural effusions. 9 mm apical segment right upper lobe ground-glass nodule (5/20), unchanged from 02/10/2024. Airway is unremarkable. Musculoskeletal: Old right rib fractures.  No acute fracture. CT ABDOMEN PELVIS  FINDINGS Hepatobiliary:  Patient's arms create streak artifact in the abdomen, degrading image quality. Liver is grossly unremarkable. Tiny gallstones. No biliary ductal dilatation. Pancreas: Negative. Spleen: Negative. Adrenals/Urinary Tract: Adrenal glands are unremarkable. Renal vascular calcifications and/or stones in the kidneys bilaterally. Kidneys are otherwise unremarkable. Ureters are decompressed. Bladder is grossly unremarkable. Stomach/Bowel: Stomach, small bowel, appendix and colon are grossly unremarkable. Moderate stool burden. Vascular/Lymphatic: Atherosclerotic calcification of the aorta. No pathologically enlarged lymph nodes. Reproductive: Prostate is mildly enlarged. Other: Mild presacral edema. No free fluid. Small umbilical hernia contains fat. Musculoskeletal: Osteopenia. Sarcopenia. Degenerative changes in the spine. There is a nondisplaced right femoral neck fracture (coronal image 44 and sagittal image 37). No additional evidence of an acute fracture. IMPRESSION: 1. Nondisplaced right femoral neck fracture. 2. No additional evidence of acute trauma in the chest, abdomen or pelvis. 3. Small bilateral pleural effusions with dependent atelectasis in both lower lobes. Difficult to exclude aspiration. 4. 9 mm apical segment right upper lobe ground-glass nodule, unchanged from 02/10/2024. This can be reassessed in 2 years, as clinically indicated. This recommendation follows the consensus statement: Guidelines for Management of Small Pulmonary Nodules Detected on CT Images: From the Fleischner Society 2017; Radiology 2017; 284:228-243. 5. Cholelithiasis. 6. Renal vascular calcifications and/or stones. 7. Enlarged prostate. 8. Aortic atherosclerosis (ICD10-I70.0). Coronary artery calcification. 9. Enlarged pulmonic trunk, indicative of pulmonary arterial hypertension. Electronically Signed   By: Newell Eke M.D.   On: 06/24/2024 16:08   Data Reviewed: Relevant notes from primary care and  specialist visits, past discharge summaries as available in EHR, including Care Everywhere . Prior diagnostic testing as pertinent to current admission diagnoses, Updated medications and problem lists for reconciliation .ED course, including vitals, labs, imaging, treatment and response to treatment,Triage notes, nursing and pharmacy notes and ED provider's notes.Notable results as noted in HPI.Discussed case with EDMD/ ED APP/ or Specialty MD on call and as needed.  Assessment & Plan  74 year old elderly male coming from skilled nursing facility [facility] for a mechanical fall from bed with a nondisplaced right femoral fracture however given his history of heart disease end-stage renal disease on hide on dialysis I am very concerned that there may be underlying syncope as a potential cause for this fall especially as there is no prodromal symptoms.  >> Fall with concern for syncope versus mechanical fall: Patient has high risk for cardiac syncope with this known heart disease history will admit patient with telemetry monitoring obtain a 2D echocardiogram, obtain troponin(although expected to be somewhat elevated with his end-stage renal disease history) and BNP as well. We will evaluate patient with an MRI of the brain to rule out acute CVA as patient has a history of stroke, and also obtain a VQ scan to identify PE, again as patient has risk factors, although mobile he is limited and walks with a walker, recent hospitalization, O2 sats of 91% on room air.   >>Nondisplaced right femoral neck fracture: Orthopedic surgery consulted , Note is pending suspect he will have  Internal fixation  for nondisplaced femoral neck fractures,  NPO for surgery Type and screen completed Continue neurovascular checks q1h x 4, then q4h Bed rest with HOB 30 degrees, elevate heels off bed Ice to affected area SCDs for mechanical DVT prophylaxis. Incentive spirometry and turn/cough/deep breathe given COPD  history  >>End-stage renal disease on hemodialysis (M/W/F) Patient missed scheduled dialysis today due to fall. Last dialysis session was Friday 06/22/24, making today a 2-day interval. Current potassium 4.3 (down from 4.9), no acute  hyperkalemia.  Nephrology consulted.  Monitor electrolytes closely, particularly potassium,Protect left arm AV graft during positioning and procedures. Avoid nephrotoxic agents and contrast.  Adjust medication dosing for renal function Close monitoring of volume status and blood pressure perioperatively given increased risk of hypotension in ESRD patients undergoing surgery.   >>Hypertensive urgency, essential hypertension: Presenting with SBP 189-200 mmHg without evidence of acute end-organ damage .Patient on amlodipine , hydralazine , and irbesartan   We will start once MRI is done.    >>CAD, history of NSTEMI s/p CABG (2018) Patient on clopidogrel  75 mg daily and atorvastatin  40 mg daily. Stable, no chest pain. EKG shows sinus rhythm with nonspecific lateral T wave abnormalities, no acute ischemic changes. Continue atorvastatin  40 mg daily. Echo is ordered.  >>COPD: No acute exacerbation. Chest X-ray shows no active disease, mild cardiomegaly, no pneumonia. Patient at increased risk for postoperative pulmonary complications. Incentive spirometry q1h while awake. Monitor oxygen saturation, currently on 2L [place] with SpO2 91-99%.   >> Diabetes mellitus type 2: Suspect resolved.  Will continue with POCT checks every 4 hour to monitor for hypoglycemia   >> Anemia:    Latest Ref Rng & Units 06/24/2024    2:54 PM 06/24/2024    2:53 PM 06/24/2024    2:47 PM  CBC  WBC 4.0 - 10.5 K/uL   7.2   Hemoglobin 13.0 - 17.0 g/dL 88.0  88.3  89.3   Hematocrit 39.0 - 52.0 % 35.0  34.0  33.2   Platelets 150 - 400 K/uL   242   Stable suspect secondary to patient's end-stage renal disease, will monitor for any perioperative blood loss, type and screen, IV PPI. Currently we  have SCDs and will resume DVT prophylaxis 24 to 48 hours postoperatively.  >> CODE STATUS: Discussed with patient and he wants to be DO NOT RESUSCITATE but does wish to have respiratory resuscitation with intubation and ventilator as needed.  CODE STATUS has been modified in the chart.   DVT prophylaxis:  SCDs.  Consults:  Nephrology.   Advance Care Planning:    Code Status: Do not attempt resuscitation (DNR) PRE-ARREST INTERVENTIONS DESIRED   Family Communication:  Called Friend's [name] Foster #: [phone].  Called Niece's #: [phone].  Disposition Plan:  SNF [facility].  Severity of Illness: The appropriate patient status for this patient is INPATIENT. Inpatient status is judged to be reasonable and necessary in order to provide the required intensity of service to ensure the patient's safety. The patient's presenting symptoms, physical exam findings, and initial radiographic and laboratory data in the context of their chronic comorbidities is felt to place them at high risk for further clinical deterioration. Furthermore, it is not anticipated that the patient will be medically stable for discharge from the hospital within 2 midnights of admission.  * I certify that at the point of admission it is my clinical judgment that the patient will require inpatient hospital care spanning beyond 2 midnights from the point of admission due to high intensity of service, high risk for further deterioration and high frequency of surveillance required.*  Unresulted Labs (From admission, onward)     Start     Ordered   06/25/24 0500  Renal function panel  Tomorrow morning,   R        06/24/24 1847   06/25/24 0500  Occult blood card to lab, stool RN will collect  Daily,   R     Question:  Specimen to be collected by:  Answer:  RN will collect  06/24/24 1904   06/24/24 1905  TSH  Add-on,   AD        06/24/24 1904   06/24/24 1905  T4, free  Add-on,   AD        06/24/24 1904   06/24/24  1905  Vitamin B12  (Anemia Panel (PNL))  Once,   R        06/24/24 1904   06/24/24 1905  Folate  (Anemia Panel (PNL))  Once,   R        06/24/24 1904   06/24/24 1905  Iron and TIBC  (Anemia Panel (PNL))  Once,   R        06/24/24 1904   06/24/24 1905  Ferritin  (Anemia Panel (PNL))  Once,   R        06/24/24 1904   06/24/24 1905  Reticulocytes  (Anemia Panel (PNL))  Once,   R        06/24/24 1904   06/24/24 1853  Pro Brain natriuretic peptide  Once,   R        06/24/24 1852   06/24/24 1433  Urinalysis, w/ Reflex to Culture (Infection Suspected) -Urine, Clean Catch  Once,   URGENT       Question:  Specimen Source  Answer:  Urine, Clean Catch   06/24/24 1432           Meds ordered this encounter  Medications   ondansetron  (ZOFRAN ) injection 4 mg   morphine  (PF) 4 MG/ML injection 4 mg   fentaNYL  (SUBLIMAZE ) injection 50 mcg   fentaNYL  (SUBLIMAZE ) injection 25 mcg   HYDROcodone -acetaminophen  (NORCO/VICODIN) 5-325 MG per tablet 1-2 tablet    Refill:  0   OR Linked Order Group    methocarbamol  (ROBAXIN ) tablet 500 mg    methocarbamol  (ROBAXIN ) injection 500 mg   polyethylene glycol (MIRALAX  / GLYCOLAX ) packet 17 g   doxercalciferol  (HECTOROL ) injection 1 mcg   HYDROmorphone  (DILAUDID ) injection 1 mg   Orders Placed This Encounter  Procedures   DG Hip Unilat W or Wo Pelvis 2-3 Views Right   CT Head Wo Contrast   CT Cervical Spine Wo Contrast   CT CHEST ABDOMEN PELVIS WO CONTRAST   DG Chest 1 View   MR BRAIN WO CONTRAST   NM Pulmonary Perfusion   CBC with Differential   Comprehensive metabolic panel   Lipase, blood   Urinalysis, w/ Reflex to Culture (Infection Suspected) -Urine, Clean Catch   Protime-INR   Renal function panel   Pro Brain natriuretic peptide   Occult blood card to lab, stool RN will collect   TSH   T4, free   Vitamin B12   Folate   Iron and TIBC   Ferritin   Reticulocytes   Diet NPO time specified   SCDs   Vital signs every hour x 4, then every 4  hours   Neurovascular checks every hour x 4, then every 4 hours   Bed with an overhead patient helper or overhead frame with a trapeze bar   Apply ice to affected area   Elevate heels off of bed   Turn cough deep breathe   Incentive spirometry   Bed rest with HOB to 30 degrees   Intake and output   If diabetic or glucose greater than 140mg /dl, notify physician to place Gylcemic Control (SSI) Order Set   Initiate Oral Care Protocol   Initiate Carrier Fluid Protocol   Document Pasero Opioid-Induced Sedation Scale (POSS) per protocol (see  sidebar report)   Cardiac Monitoring - Continuous Indefinite   Place and maintain sequential compression device   Swallow screen   STAT CBG when hypoglycemia is suspected. If treated, recheck every 15 minutes after each treatment until CBG >/= 70 mg/dl   Refer to Hypoglycemia Protocol Sidebar Report for treatment of CBG < 70 mg/dl   Use NIH Stroke Scale   Do not attempt resuscitation (DNR) Pre-Arrest Interventions Desired   Consult to orthopedic surgery   Consult to hospitalist   Consult to Transition of Care Team   Consult to Registered Dietitian   Consult to anesthesiology for nerve block Laterality: Right; Nerve block will be performed in PACU.   Oxygen therapy Mode or (Route): Nasal cannula; Liters Per Minute: 2   Pulse oximetry, continuous   Pulse oximetry, every 4 hours   I-stat chem 8, ED   I-Stat Lactic Acid   I-Stat venous blood gas, ED   ED EKG   EKG 12-Lead   ECHOCARDIOGRAM COMPLETE BUBBLE STUDY   Type and screen [facility]   Saline lock IV   Admit to Inpatient (patient's expected length of stay will be greater than 2 midnights or inpatient only procedure)    Author: Mario LULLA Blanch, MD 12 pm- 8 pm. [facility]. 06/24/2024 7:05 PM Please note for any communication after hours contact [facility] Assigned provider on call on Amion.     [1] No Known Allergies  "

## 2024-06-25 ENCOUNTER — Encounter (HOSPITAL_COMMUNITY): Payer: Self-pay | Admitting: Internal Medicine

## 2024-06-25 ENCOUNTER — Inpatient Hospital Stay (HOSPITAL_COMMUNITY)

## 2024-06-25 ENCOUNTER — Encounter (HOSPITAL_COMMUNITY)
Admission: EM | Disposition: A | Discharge status: Skilled Nursing Facility | Payer: Self-pay | Source: Skilled Nursing Facility | Attending: Internal Medicine

## 2024-06-25 DIAGNOSIS — S72001A Fracture of unspecified part of neck of right femur, initial encounter for closed fracture: Secondary | ICD-10-CM | POA: Diagnosis not present

## 2024-06-25 DIAGNOSIS — Z87891 Personal history of nicotine dependence: Secondary | ICD-10-CM

## 2024-06-25 DIAGNOSIS — I10 Essential (primary) hypertension: Secondary | ICD-10-CM | POA: Diagnosis not present

## 2024-06-25 DIAGNOSIS — I2510 Atherosclerotic heart disease of native coronary artery without angina pectoris: Secondary | ICD-10-CM | POA: Diagnosis not present

## 2024-06-25 LAB — RENAL FUNCTION PANEL
Albumin: 4.3 g/dL (ref 3.5–5.0)
Anion gap: 14 (ref 5–15)
BUN: 58 mg/dL — ABNORMAL HIGH (ref 8–23)
CO2: 26 mmol/L (ref 22–32)
Calcium: 9.7 mg/dL (ref 8.9–10.3)
Chloride: 99 mmol/L (ref 98–111)
Creatinine, Ser: 8.97 mg/dL — ABNORMAL HIGH (ref 0.61–1.24)
GFR, Estimated: 6 mL/min — ABNORMAL LOW
Glucose, Bld: 143 mg/dL — ABNORMAL HIGH (ref 70–99)
Phosphorus: 4.5 mg/dL (ref 2.5–4.6)
Potassium: 5.2 mmol/L — ABNORMAL HIGH (ref 3.5–5.1)
Sodium: 139 mmol/L (ref 135–145)

## 2024-06-25 LAB — SURGICAL PCR SCREEN
MRSA, PCR: POSITIVE — AB
Staphylococcus aureus: POSITIVE — AB

## 2024-06-25 LAB — IRON AND TIBC
Iron: 27 ug/dL — ABNORMAL LOW (ref 45–182)
Saturation Ratios: 10 % — ABNORMAL LOW (ref 17.9–39.5)
TIBC: 276 ug/dL (ref 250–450)
UIBC: 249 ug/dL

## 2024-06-25 LAB — TROPONIN T, HIGH SENSITIVITY: Troponin T High Sensitivity: 158 ng/L (ref 0–19)

## 2024-06-25 LAB — GLUCOSE, CAPILLARY: Glucose-Capillary: 73 mg/dL (ref 70–99)

## 2024-06-25 LAB — FERRITIN: Ferritin: 953 ng/mL — ABNORMAL HIGH (ref 24–336)

## 2024-06-25 LAB — PRO BRAIN NATRIURETIC PEPTIDE: Pro Brain Natriuretic Peptide: 17514 pg/mL — ABNORMAL HIGH

## 2024-06-25 LAB — VITAMIN B12: Vitamin B-12: 778 pg/mL (ref 180–914)

## 2024-06-25 LAB — FOLATE: Folate: 10.7 ng/mL

## 2024-06-25 LAB — T4, FREE: Free T4: 1.29 ng/dL (ref 0.80–2.00)

## 2024-06-25 LAB — HEPATITIS B SURFACE ANTIGEN: Hepatitis B Surface Ag: NONREACTIVE

## 2024-06-25 MED ORDER — SODIUM CHLORIDE 0.9 % IV SOLN: INTRAVENOUS | Status: DC

## 2024-06-25 MED ORDER — FENTANYL CITRATE (PF) 50 MCG/ML IJ SOSY
PREFILLED_SYRINGE | INTRAMUSCULAR | Status: AC
  Filled 2024-06-25: qty 1

## 2024-06-25 MED ORDER — AMISULPRIDE (ANTIEMETIC) 5 MG/2ML IV SOLN: 10.0000 mg | Freq: Once | INTRAVENOUS | Status: DC | PRN

## 2024-06-25 MED ORDER — TRANEXAMIC ACID 1000 MG/10ML IV SOLN
2000.0000 mg | INTRAVENOUS | Status: DC
  Filled 2024-06-25: qty 20

## 2024-06-25 MED ORDER — PROPOFOL 10 MG/ML IV BOLUS
INTRAVENOUS | Status: AC
  Filled 2024-06-25: qty 20

## 2024-06-25 MED ORDER — LACTATED RINGERS IV SOLN: INTRAVENOUS | Status: DC

## 2024-06-25 MED ORDER — CEFAZOLIN SODIUM-DEXTROSE 2-4 GM/100ML-% IV SOLN
INTRAVENOUS | Status: AC
  Filled 2024-06-25: qty 100

## 2024-06-25 MED ORDER — TECHNETIUM TO 99M ALBUMIN AGGREGATED
4.0000 | Freq: Once | INTRAVENOUS | Status: AC | PRN
  Administered 2024-06-25: 3.94 via INTRAVENOUS

## 2024-06-25 MED ORDER — LABETALOL HCL 5 MG/ML IV SOLN
INTRAVENOUS | Status: AC
  Filled 2024-06-25: qty 4

## 2024-06-25 MED ORDER — LIDOCAINE 2% (20 MG/ML) 5 ML SYRINGE
INTRAMUSCULAR | Status: DC | PRN
  Administered 2024-06-25: 100 mg via INTRAVENOUS

## 2024-06-25 MED ORDER — PHENYLEPHRINE HCL-NACL 20-0.9 MG/250ML-% IV SOLN
INTRAVENOUS | Status: DC | PRN
  Administered 2024-06-25: 50 ug/min via INTRAVENOUS

## 2024-06-25 MED ORDER — ALUM & MAG HYDROXIDE-SIMETH 200-200-20 MG/5ML PO SUSP: 30.0000 mL | ORAL | Status: DC | PRN

## 2024-06-25 MED ORDER — 0.9 % SODIUM CHLORIDE (POUR BTL) OPTIME
TOPICAL | Status: DC | PRN
  Administered 2024-06-25: 1000 mL

## 2024-06-25 MED ORDER — MORPHINE SULFATE (PF) 2 MG/ML IV SOLN: 0.5000 mg | INTRAVENOUS | Status: DC | PRN

## 2024-06-25 MED ORDER — ONDANSETRON HCL 4 MG PO TABS: 4.0000 mg | ORAL_TABLET | Freq: Four times a day (QID) | ORAL | Status: DC | PRN

## 2024-06-25 MED ORDER — CHLORHEXIDINE GLUCONATE 0.12 % MT SOLN: 15.0000 mL | Freq: Once | OROMUCOSAL | Status: AC

## 2024-06-25 MED ORDER — HEPARIN SODIUM (PORCINE) 1000 UNIT/ML DIALYSIS: 1000.0000 [IU] | INTRAMUSCULAR | Status: DC | PRN

## 2024-06-25 MED ORDER — ONDANSETRON HCL 4 MG/2ML IJ SOLN: 4.0000 mg | Freq: Four times a day (QID) | INTRAMUSCULAR | Status: DC | PRN

## 2024-06-25 MED ORDER — ACETAMINOPHEN 500 MG PO TABS
500.0000 mg | ORAL_TABLET | Freq: Four times a day (QID) | ORAL | Status: AC
  Administered 2024-06-25 – 2024-06-26 (×3): 500 mg via ORAL
  Filled 2024-06-25 (×3): qty 1

## 2024-06-25 MED ORDER — POVIDONE-IODINE 10 % EX SWAB
2.0000 | Freq: Once | CUTANEOUS | Status: AC
  Administered 2024-06-25: 2 via TOPICAL

## 2024-06-25 MED ORDER — TRANEXAMIC ACID-NACL 1000-0.7 MG/100ML-% IV SOLN
1000.0000 mg | Freq: Once | INTRAVENOUS | Status: AC
  Administered 2024-06-25: 1000 mg via INTRAVENOUS
  Filled 2024-06-25 (×2): qty 100

## 2024-06-25 MED ORDER — ACETAMINOPHEN 10 MG/ML IV SOLN: 1000.0000 mg | Freq: Once | INTRAVENOUS | Status: DC | PRN

## 2024-06-25 MED ORDER — ONDANSETRON HCL 4 MG/2ML IJ SOLN
INTRAMUSCULAR | Status: DC | PRN
  Administered 2024-06-25: 4 mg via INTRAVENOUS

## 2024-06-25 MED ORDER — HYDROMORPHONE HCL 1 MG/ML IJ SOLN
1.0000 mg | INTRAMUSCULAR | Status: DC | PRN
  Administered 2024-06-25: 1 mg via INTRAVENOUS

## 2024-06-25 MED ORDER — ONDANSETRON HCL 4 MG/2ML IJ SOLN
INTRAMUSCULAR | Status: AC
  Filled 2024-06-25: qty 2

## 2024-06-25 MED ORDER — HYDROCODONE-ACETAMINOPHEN 7.5-325 MG PO TABS
1.0000 | ORAL_TABLET | Freq: Every day | ORAL | Status: DC | PRN
  Administered 2024-06-28: 1 via ORAL
  Filled 2024-06-25: qty 1

## 2024-06-25 MED ORDER — LIDOCAINE HCL (PF) 1 % IJ SOLN: 5.0000 mL | INTRAMUSCULAR | Status: DC | PRN

## 2024-06-25 MED ORDER — CEFAZOLIN SODIUM-DEXTROSE 2-4 GM/100ML-% IV SOLN
2.0000 g | Freq: Four times a day (QID) | INTRAVENOUS | Status: AC
  Administered 2024-06-25 – 2024-06-26 (×3): 2 g via INTRAVENOUS
  Filled 2024-06-25 (×4): qty 100

## 2024-06-25 MED ORDER — ROCURONIUM BROMIDE 10 MG/ML (PF) SYRINGE
PREFILLED_SYRINGE | INTRAVENOUS | Status: DC | PRN
  Administered 2024-06-25: 50 mg via INTRAVENOUS

## 2024-06-25 MED ORDER — DEXAMETHASONE SOD PHOSPHATE PF 10 MG/ML IJ SOLN
INTRAMUSCULAR | Status: AC
  Filled 2024-06-25: qty 1

## 2024-06-25 MED ORDER — CHLORHEXIDINE GLUCONATE 0.12 % MT SOLN
OROMUCOSAL | Status: AC
  Administered 2024-06-25: 15 mL via OROMUCOSAL
  Filled 2024-06-25: qty 15

## 2024-06-25 MED ORDER — OXYCODONE HCL 5 MG/5ML PO SOLN: 5.0000 mg | Freq: Once | ORAL | Status: DC | PRN

## 2024-06-25 MED ORDER — SUGAMMADEX SODIUM 200 MG/2ML IV SOLN
INTRAVENOUS | Status: DC | PRN
  Administered 2024-06-25: 2 mg via INTRAVENOUS

## 2024-06-25 MED ORDER — DOCUSATE SODIUM 100 MG PO CAPS
100.0000 mg | ORAL_CAPSULE | Freq: Two times a day (BID) | ORAL | Status: DC
  Administered 2024-06-25 – 2024-07-01 (×13): 100 mg via ORAL
  Filled 2024-06-25 (×13): qty 1

## 2024-06-25 MED ORDER — FENTANYL CITRATE (PF) 250 MCG/5ML IJ SOLN
INTRAMUSCULAR | Status: DC | PRN
  Administered 2024-06-25: 50 ug via INTRAVENOUS

## 2024-06-25 MED ORDER — FENTANYL CITRATE (PF) 100 MCG/2ML IJ SOLN
INTRAMUSCULAR | Status: AC
  Filled 2024-06-25: qty 2

## 2024-06-25 MED ORDER — FENTANYL CITRATE (PF) 100 MCG/2ML IJ SOLN
25.0000 ug | INTRAMUSCULAR | Status: DC | PRN
  Administered 2024-06-25: 25 ug via INTRAVENOUS

## 2024-06-25 MED ORDER — ANTICOAGULANT SODIUM CITRATE 4% (200MG/5ML) IV SOLN: 5.0000 mL | Status: DC | PRN

## 2024-06-25 MED ORDER — ACETAMINOPHEN 325 MG PO TABS
325.0000 mg | ORAL_TABLET | Freq: Four times a day (QID) | ORAL | Status: DC | PRN
  Administered 2024-06-28: 325 mg via ORAL
  Administered 2024-06-30: 650 mg via ORAL
  Filled 2024-06-25 (×2): qty 2

## 2024-06-25 MED ORDER — PENTAFLUOROPROP-TETRAFLUOROETH EX AERO: 1.0000 | INHALATION_SPRAY | CUTANEOUS | Status: DC | PRN

## 2024-06-25 MED ORDER — OXYCODONE HCL 5 MG PO TABS: 5.0000 mg | ORAL_TABLET | Freq: Once | ORAL | Status: DC | PRN

## 2024-06-25 MED ORDER — METHOCARBAMOL 500 MG PO TABS
500.0000 mg | ORAL_TABLET | Freq: Three times a day (TID) | ORAL | Status: DC | PRN
  Administered 2024-07-01: 500 mg via ORAL
  Filled 2024-06-25: qty 1

## 2024-06-25 MED ORDER — HYDROMORPHONE HCL 1 MG/ML IJ SOLN
INTRAMUSCULAR | Status: AC
  Filled 2024-06-25: qty 1

## 2024-06-25 MED ORDER — MENTHOL 3 MG MT LOZG: 1.0000 | LOZENGE | OROMUCOSAL | Status: DC | PRN

## 2024-06-25 MED ORDER — LIDOCAINE-PRILOCAINE 2.5-2.5 % EX CREA: 1.0000 | TOPICAL_CREAM | CUTANEOUS | Status: DC | PRN

## 2024-06-25 MED ORDER — SORBITOL 70 % SOLN: 30.0000 mL | Freq: Every day | Status: DC | PRN

## 2024-06-25 MED ORDER — CEFAZOLIN SODIUM-DEXTROSE 2-4 GM/100ML-% IV SOLN
2.0000 g | INTRAVENOUS | Status: AC
  Administered 2024-06-25: 2 g via INTRAVENOUS

## 2024-06-25 MED ORDER — DEXAMETHASONE SOD PHOSPHATE PF 10 MG/ML IJ SOLN
INTRAMUSCULAR | Status: DC | PRN
  Administered 2024-06-25: 10 mg via INTRAVENOUS

## 2024-06-25 MED ORDER — HYDROCODONE-ACETAMINOPHEN 5-325 MG PO TABS
1.0000 | ORAL_TABLET | Freq: Two times a day (BID) | ORAL | Status: DC | PRN
  Administered 2024-06-25 – 2024-06-27 (×4): 1 via ORAL
  Filled 2024-06-25: qty 2
  Filled 2024-06-25 (×4): qty 1

## 2024-06-25 MED ORDER — TRANEXAMIC ACID-NACL 1000-0.7 MG/100ML-% IV SOLN: 1000.0000 mg | INTRAVENOUS | Status: DC

## 2024-06-25 MED ORDER — POLYETHYLENE GLYCOL 3350 17 G PO PACK
17.0000 g | PACK | Freq: Every day | ORAL | Status: DC | PRN
  Administered 2024-06-30: 17 g via ORAL

## 2024-06-25 MED ORDER — LABETALOL HCL 5 MG/ML IV SOLN
10.0000 mg | Freq: Once | INTRAVENOUS | Status: AC
  Administered 2024-06-25: 10 mg via INTRAVENOUS

## 2024-06-25 MED ORDER — ORAL CARE MOUTH RINSE: 15.0000 mL | Freq: Once | OROMUCOSAL | Status: AC

## 2024-06-25 MED ORDER — METHOCARBAMOL 1000 MG/10ML IJ SOLN: 500.0000 mg | Freq: Three times a day (TID) | INTRAMUSCULAR | Status: DC | PRN

## 2024-06-25 MED ORDER — LIDOCAINE 2% (20 MG/ML) 5 ML SYRINGE
INTRAMUSCULAR | Status: AC
  Filled 2024-06-25: qty 5

## 2024-06-25 MED ORDER — ALTEPLASE 2 MG IJ SOLR: 2.0000 mg | Freq: Once | INTRAMUSCULAR | Status: DC | PRN

## 2024-06-25 MED ORDER — PHENOL 1.4 % MT LIQD: 1.0000 | OROMUCOSAL | Status: DC | PRN

## 2024-06-25 MED ORDER — MAGNESIUM CITRATE PO SOLN: 1.0000 | Freq: Once | ORAL | Status: DC | PRN

## 2024-06-25 MED ORDER — ONDANSETRON HCL 4 MG/2ML IJ SOLN: 4.0000 mg | Freq: Once | INTRAMUSCULAR | Status: DC | PRN

## 2024-06-25 MED ORDER — HYDROCODONE-ACETAMINOPHEN 5-325 MG PO TABS: 1.0000 | ORAL_TABLET | Freq: Four times a day (QID) | ORAL | 0 refills | Status: DC | PRN

## 2024-06-25 MED ORDER — PHENYLEPHRINE 80 MCG/ML (10ML) SYRINGE FOR IV PUSH (FOR BLOOD PRESSURE SUPPORT)
PREFILLED_SYRINGE | INTRAVENOUS | Status: AC
  Filled 2024-06-25: qty 10

## 2024-06-25 MED ORDER — PROPOFOL 10 MG/ML IV BOLUS
INTRAVENOUS | Status: DC | PRN
  Administered 2024-06-25: 70 mg via INTRAVENOUS

## 2024-06-25 MED ORDER — DOXERCALCIFEROL 4 MCG/2ML IV SOLN
INTRAVENOUS | Status: AC
  Filled 2024-06-25: qty 2

## 2024-06-25 MED ORDER — TRANEXAMIC ACID-NACL 1000-0.7 MG/100ML-% IV SOLN
INTRAVENOUS | Status: AC
  Filled 2024-06-25: qty 100

## 2024-06-25 NOTE — Discharge Instructions (Signed)
 "  Postoperative instructions:  Weightbearing instructions: 25% partial weight bearing  Keep your dressing and/or splint clean and dry at all times.  You can remove your dressing on post-operative day #3 and change with a dry/sterile dressing or Band-Aids as needed thereafter.    Incision instructions:  Do not soak your incision for 3 weeks after surgery.  If the incision gets wet, pat dry and do not scrub the incision.  Pain control:  You have been given a prescription to be taken as directed for post-operative pain control.  In addition, elevate the operative extremity above the heart at all times to prevent swelling and throbbing pain.  Take over-the-counter Colace, 100mg  by mouth twice a day while taking narcotic pain medications to help prevent constipation.  Follow up appointments: 1) 14 days for suture removal and wound check. 2) Dr. Benjiman PA, morna grave as scheduled.   -------------------------------------------------------------------------------------------------------------  After Surgery Pain Control:  After your surgery, post-surgical discomfort or pain is likely. This discomfort can last several days to a few weeks. At certain times of the day your discomfort may be more intense.  Did you receive a nerve block?  A nerve block can provide pain relief for one hour to two days after your surgery. As long as the nerve block is working, you will experience little or no sensation in the area the surgeon operated on.  As the nerve block wears off, you will begin to experience pain or discomfort. It is very important that you begin taking your prescribed pain medication before the nerve block fully wears off. Treating your pain at the first sign of the block wearing off will ensure your pain is better controlled and more tolerable when full-sensation returns. Do not wait until the pain is intolerable, as the medicine will be less effective. It is better to treat pain in advance  than to try and catch up.  General Anesthesia:  If you did not receive a nerve block during your surgery, you will need to start taking your pain medication shortly after your surgery and should continue to do so as prescribed by your surgeon.  Pain Medication:  Most commonly we prescribe Vicodin and Percocet for post-operative pain. Both of these medications contain a combination of acetaminophen  (Tylenol ) and a narcotic to help control pain.   It takes between 30 and 45 minutes before pain medication starts to work. It is important to take your medication before your pain level gets too intense.   Nausea is a common side effect of many pain medications. You will want to eat something before taking your pain medicine to help prevent nausea.   If you are taking a prescription pain medication that contains acetaminophen , we recommend that you do not take additional over the counter acetaminophen  (Tylenol ).  Other pain relieving options:   Using a cold pack to ice the affected area a few times a day (15 to 20 minutes at a time) can help to relieve pain, reduce swelling and bruising.   Elevation of the affected area can also help to reduce pain and swelling.  Per [facility] clinic policy, our goal is ensure optimal postoperative pain control with a multimodal pain management strategy. For all [facility] patients, our goal is to wean post-operative narcotic medications by 6 weeks post-operatively. If this is not possible due to utilization of pain medication prior to surgery, your [facility] doctor will support your acute post-operative pain control for the first 6 weeks postoperatively, with a plan to  transition you back to your primary pain team following that. Shawn Collins will work to ensure a therapist, occupational.  "

## 2024-06-25 NOTE — Transfer of Care (Signed)
 Immediate Anesthesia Transfer of Care Note  Patient: Shawn Collins  Procedure(s) Performed: FIXATION, FEMUR, NECK, PERCUTANEOUS, USING SCREW (Right: Hip)  Patient Location: PACU  Anesthesia Type:General  Level of Consciousness: awake  Airway & Oxygen Therapy: Patient Spontanous Breathing and Patient connected to face mask oxygen  Post-op Assessment: Report given to RN and Post -op Vital signs reviewed and stable  Post vital signs: Reviewed and stable  Last Vitals:  Vitals Value Taken Time  BP 220/88 06/25/24 17:06  Temp 37.1 C 06/25/24 17:00  Pulse 72 06/25/24 17:13  Resp 15 06/25/24 17:13  SpO2 100 % 06/25/24 17:13  Vitals shown include unfiled device data.  Last Pain:  Vitals:   06/25/24 1700  TempSrc:   PainSc: 0-No pain         Complications: No notable events documented.

## 2024-06-25 NOTE — Progress Notes (Signed)
" °   06/25/24 1204  Vitals  Temp 98 F (36.7 C)  Pulse Rate 80  Resp 12  BP (!) 194/80  SpO2 99 %  O2 Device Nasal Cannula  Weight  (Unable to weigh)  Type of Weight Post-Dialysis  Oxygen Therapy  O2 Flow Rate (L/min) 2 L/min  Patient Activity (if Appropriate) In bed  Post Treatment  Dialyzer Clearance Lightly streaked  Liters Processed 70.9  Fluid Removed (mL) 70.9 mL  Tolerated HD Treatment Yes  Post-Hemodialysis Comments Pt. tolerated tx well but still complaining of pain on his hip  AVG/AVF Arterial Site Held (minutes) 10 minutes  AVG/AVF Venous Site Held (minutes) 10 minutes    "

## 2024-06-25 NOTE — Progress Notes (Signed)
 " PROGRESS NOTE    Shawn Collins  FMW:[id number] DOB: March 25, 1951 DOA: 06/24/2024 PCP: Shelda Atlas, MD  Subjective: Patient was seen in the hemodialysis suite.  He just received fentanyl  and had no pain.  His breathing was comfortable.  He had no other complaints.  Denied hemoptysis.    Hospital Course: No notes on file   Assessment and Plan:  Right hip fracture      - Orthopedic surgery planning to take him to the OR later tonight or tomorrow morning for surgery  2.   End-stage renal disease     - Tolerated hemodialysis today.  Lungs are clear.  K to proceed OR 3.   Question of pulmonary embolism as a cause of syncope     Nuclear medicine perfusion scan is low probability.  No Further workup.     DVT prophylaxis: Place and maintain sequential compression device Start: 06/24/24 1843 SCDs Start: 06/24/24 1832     Code Status: Do not attempt resuscitation (DNR) PRE-ARREST INTERVENTIONS DESIRED Family Communication: None available Disposition Plan: Possibly will need rehabilitation. Reason for continuing need for hospitalization: Hip fracture repair  Objective: Vitals:   06/25/24 0900 06/25/24 0930 06/25/24 1000 06/25/24 1030  BP: (!) 170/64 (!) 185/67 (!) 177/78 (!) 188/72  Pulse: 77 73 75 77  Resp: (!) 26 (!) 27 12 15   Temp:      TempSrc:      SpO2: 93% 100% 93% 100%  Weight:      Height:       No intake or output data in the 24 hours ending 06/25/24 1041 Filed Weights   06/25/24 0243 06/25/24 0800  Weight: 58.9 kg 58.9 kg    Examination:  Chronically ill male lying in the stretcher in the hemodialysis suite.  Currently looks comfortable.  Work of breathing HEENT: PERRLA Cabbell EOMI no scleral icterus CV: Regular rate and rhythm S1-S2 no murmurs rubs gallops Lungs: Clear Abdomen: Soft bowel sounds present extremities: Foreshortening and external rotation of the right lower extremity.  Both lower extremities without edema and neurovascularly intact Logic: He  is awake alert coherent can move his upper extremities and his left lower extremity.  Right lower extremity movement is impaired due to pain.   Data Reviewed: I have personally reviewed following labs and imaging studies  CBC: Recent Labs  Lab 06/24/24 1447 06/24/24 1453 06/24/24 1454  WBC 7.2  --   --   NEUTROABS 6.1  --   --   HGB 10.6* 11.6* 11.9*  HCT 33.2* 34.0* 35.0*  MCV 98.5  --   --   PLT 242  --   --    Basic Metabolic Panel: Recent Labs  Lab 06/24/24 1447 06/24/24 1453 06/24/24 1454 06/25/24 0522  NA 139 140 140 139  K 4.9 4.3 4.3 5.2*  CL 98 101  --  99  CO2 25  --   --  26  GLUCOSE 139* 140*  --  143*  BUN 51* 49*  --  58*  CREATININE 7.83* 8.90*  --  8.97*  CALCIUM  9.7  --   --  9.7  PHOS  --   --   --  4.5   GFR: Estimated Creatinine Clearance: 5.9 mL/min (A) (by C-G formula based on SCr of 8.97 mg/dL (H)). Liver Function Tests: Recent Labs  Lab 06/24/24 1447 06/25/24 0522  AST 37  --   ALT 15  --   ALKPHOS 88  --   BILITOT 0.6  --  PROT 7.5  --   ALBUMIN  4.3 4.3   Recent Labs  Lab 06/24/24 1447  LIPASE 27   No results for input(s): AMMONIA in the last 168 hours. Coagulation Profile: Recent Labs  Lab 06/24/24 1503  INR 1.3*   Cardiac Enzymes: No results for input(s): CKTOTAL, CKMB, CKMBINDEX, TROPONINI in the last 168 hours. ProBNP, BNP (last 5 results) Recent Labs    07/10/23 1302 06/24/24 2303  PROBNP  --  17,514.0*  BNP >4,500.0*  --    HbA1C: No results for input(s): HGBA1C in the last 72 hours. CBG: No results for input(s): GLUCAP in the last 168 hours. Lipid Profile: No results for input(s): CHOL, HDL, LDLCALC, TRIG, CHOLHDL, LDLDIRECT in the last 72 hours. Thyroid  Function Tests: Recent Labs    06/24/24 2303  TSH 3.490  FREET4 1.29   Anemia Panel: Recent Labs    06/24/24 2303  VITAMINB12 778  FOLATE 10.7  FERRITIN 953*  TIBC 276  IRON 27*  RETICCTPCT 1.6   Sepsis  Labs: Recent Labs  Lab 06/24/24 1454 06/24/24 2316  LATICACIDVEN 0.8 1.0    No results found for this or any previous visit (from the past 240 hours).   Radiology Studies: MR BRAIN WO CONTRAST Result Date: 06/24/2024 CLINICAL DATA:  Initial evaluation for acute syncope/presyncope, stroke suspected. EXAM: MRI HEAD WITHOUT CONTRAST TECHNIQUE: Multiplanar, multiecho pulse sequences of the brain and surrounding structures were obtained without intravenous contrast. COMPARISON:  CT from earlier the same day. FINDINGS: Brain: Mild age-related cerebral atrophy. Patchy and confluent T2/FLAIR hyperintensity involving the periventricular deep white matter, consistent with chronic small vessel ischemic disease, moderate in nature. Few small remote lacunar infarcts present about the left basal ganglia, left frontal corona radiata, and thalami. Few probable tiny remote cerebellar infarcts noted. No evidence for acute or subacute infarct. Gray-white matter differentiation maintained. No areas of chronic cortical infarction. No visible acute intracranial hemorrhage. Suspected single chronic microhemorrhage at the left cerebellum noted, of doubtful significance in isolation. No mass lesion, midline shift or mass effect. Mild ventricular prominence related global parenchymal volume loss without hydrocephalus. No extra-axial fluid collection. Pituitary gland within normal limits. Vascular: Major intracranial vascular flow voids are maintained. Skull and upper cervical spine: Craniocervical junction within normal limits. Bone marrow signal intensity overall within normal limits. No scalp soft tissue abnormality. Sinuses/Orbits: Globes and orbital soft tissues within normal limits. Mild mucosal thickening about the ethmoidal air cells. Paranasal sinuses are otherwise largely clear. No significant mastoid effusion. Other: None. IMPRESSION: 1. No acute intracranial abnormality. 2. Age-related cerebral atrophy with moderate  chronic microvascular ischemic disease, with a few scattered remote lacunar infarcts as above. Electronically Signed   By: Morene Hoard M.D.   On: 06/24/2024 19:55   CT Head Wo Contrast Result Date: 06/24/2024 CLINICAL DATA:  Head trauma EXAM: CT HEAD WITHOUT CONTRAST TECHNIQUE: Contiguous axial images were obtained from the base of the skull through the vertex without intravenous contrast. RADIATION DOSE REDUCTION: This exam was performed according to the departmental dose-optimization program which includes automated exposure control, adjustment of the mA and/or kV according to patient size and/or use of iterative reconstruction technique. COMPARISON:  05/21/2024 FINDINGS: Brain: No acute territorial infarction, hemorrhage or intracranial mass. Atrophy and chronic small vessel ischemic changes of the white matter. 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 The ventricles are stable in size. Vascular: No hyperdense vessels. Vertebral and carotid vascular calcification Skull: Normal. Negative for fracture or focal lesion. Sinuses/Orbits: No acute  finding. Other: None IMPRESSION: 1. No CT evidence for acute intracranial abnormality. 2. Atrophy and chronic small vessel ischemic changes of the white matter. Electronically Signed   By: Luke Bun M.D.   On: 06/24/2024 16:26   DG Hip Unilat W or Wo Pelvis 2-3 Views Right Result Date: 06/24/2024 CLINICAL DATA:  Fall EXAM: DG HIP (WITH OR WITHOUT PELVIS) 2-3V RIGHT COMPARISON:  CT 06/24/2024 FINDINGS: SI joints are non widened. Vascular calcifications. Pubic symphysis and rami appear intact. Acute nondisplaced right femoral neck fracture. No femoral head dislocation. IMPRESSION: Acute nondisplaced right femoral neck fracture. Electronically Signed   By: Luke Bun M.D.   On: 06/24/2024 16:23   DG Chest 1 View Result Date: 06/24/2024 CLINICAL DATA:  Shortness of breath EXAM: CHEST  1 VIEW COMPARISON:  05/24/2024 FINDINGS: Post  sternotomy changes. Mild cardiomegaly. No acute airspace disease, pleural effusion or pneumothorax. IMPRESSION: No active disease. Mild cardiomegaly. Electronically Signed   By: Luke Bun M.D.   On: 06/24/2024 16:21   CT Cervical Spine Wo Contrast Result Date: 06/24/2024 CLINICAL DATA:  Neck trauma fall EXAM: CT CERVICAL SPINE WITHOUT CONTRAST TECHNIQUE: Multidetector CT imaging of the cervical spine was performed without intravenous contrast. Multiplanar CT image reconstructions were also generated. RADIATION DOSE REDUCTION: This exam was performed according to the departmental dose-optimization program which includes automated exposure control, adjustment of the mA and/or kV according to patient size and/or use of iterative reconstruction technique. COMPARISON:  None Available. FINDINGS: Alignment: Straightening of the cervical spine. No subluxation. Facet alignment is normal Skull base and vertebrae: No acute fracture. No primary bone lesion or focal pathologic process. Soft tissues and spinal canal: No prevertebral fluid or swelling. No visible canal hematoma. Disc levels: Advanced disc space narrowing and degenerative change C2 through C7. Multilevel facet degenerative change and foraminal narrowing. Interbody ankylosis C4-C5 with fusion of the right greater than left facets at this level. No high-grade bony canal stenosis Upper chest: Negative. Other: None IMPRESSION: Straightening of the cervical spine with advanced degenerative changes. No acute osseous abnormality. Electronically Signed   By: Luke Bun M.D.   On: 06/24/2024 16:20   CT CHEST ABDOMEN PELVIS WO CONTRAST Result Date: 06/24/2024 CLINICAL DATA:  Sepsis, fall from bed, right-sided hip pain. On blood thinners. EXAM: CT CHEST, ABDOMEN AND PELVIS WITHOUT CONTRAST TECHNIQUE: Multidetector CT imaging of the chest, abdomen and pelvis was performed following the standard protocol without IV contrast. RADIATION DOSE REDUCTION: This exam was  performed according to the departmental dose-optimization program which includes automated exposure control, adjustment of the mA and/or kV according to patient size and/or use of iterative reconstruction technique. COMPARISON:  CT abdomen pelvis 05/24/2024 and CT chest 02/10/2024. FINDINGS: CT CHEST FINDINGS Cardiovascular: Atherosclerotic calcification of the aorta, aortic valve and coronary arteries. Enlarged pulmonic trunk and heart. No pericardial effusion. Mediastinum/Nodes: No pathologically enlarged mediastinal or axillary lymph nodes. Hilar regions are difficult to definitively evaluate without IV contrast. Bilateral gynecomastia. Esophagus is grossly unremarkable. Lungs/Pleura: Image quality is degraded by expiratory phase imaging and respiratory motion. Mild dependent atelectasis bilaterally with small bilateral pleural effusions. 9 mm apical segment right upper lobe ground-glass nodule (5/20), unchanged from 02/10/2024. Airway is unremarkable. Musculoskeletal: Old right rib fractures.  No acute fracture. CT ABDOMEN PELVIS FINDINGS Hepatobiliary: Patient's arms create streak artifact in the abdomen, degrading image quality. Liver is grossly unremarkable. Tiny gallstones. No biliary ductal dilatation. Pancreas: Negative. Spleen: Negative. Adrenals/Urinary Tract: Adrenal glands are unremarkable. Renal vascular calcifications and/or stones in the  kidneys bilaterally. Kidneys are otherwise unremarkable. Ureters are decompressed. Bladder is grossly unremarkable. Stomach/Bowel: Stomach, small bowel, appendix and colon are grossly unremarkable. Moderate stool burden. Vascular/Lymphatic: Atherosclerotic calcification of the aorta. No pathologically enlarged lymph nodes. Reproductive: Prostate is mildly enlarged. Other: Mild presacral edema. No free fluid. Small umbilical hernia contains fat. Musculoskeletal: Osteopenia. Sarcopenia. Degenerative changes in the spine. There is a nondisplaced right femoral neck  fracture (coronal image 44 and sagittal image 37). No additional evidence of an acute fracture. IMPRESSION: 1. Nondisplaced right femoral neck fracture. 2. No additional evidence of acute trauma in the chest, abdomen or pelvis. 3. Small bilateral pleural effusions with dependent atelectasis in both lower lobes. Difficult to exclude aspiration. 4. 9 mm apical segment right upper lobe ground-glass nodule, unchanged from 02/10/2024. This can be reassessed in 2 years, as clinically indicated. This recommendation follows the consensus statement: Guidelines for Management of Small Pulmonary Nodules Detected on CT Images: From the Fleischner Society 2017; Radiology 2017; 284:228-243. 5. Cholelithiasis. 6. Renal vascular calcifications and/or stones. 7. Enlarged prostate. 8. Aortic atherosclerosis (ICD10-I70.0). Coronary artery calcification. 9. Enlarged pulmonic trunk, indicative of pulmonary arterial hypertension. Electronically Signed   By: Newell Eke M.D.   On: 06/24/2024 16:08    Scheduled Meds:  Chlorhexidine  Gluconate Cloth  6 each Topical Q0600   [START ON 06/26/2024] doxercalciferol   1 mcg Intravenous Q M,W,F-HD   irbesartan   300 mg Oral Daily   pantoprazole  (PROTONIX ) IV  40 mg Intravenous Q12H   sevelamer  carbonate  1,600 mg Oral TID WC   And   sevelamer  carbonate  800 mg Oral With snacks   Continuous Infusions:  anticoagulant sodium citrate        LOS: 1 day   Time spent: 30 minutes  Lonni KANDICE Moose, MD  [facility]  06/25/2024, 10:41 AM   "

## 2024-06-25 NOTE — Progress Notes (Signed)
 Initial Nutrition Assessment  DOCUMENTATION CODES:   Severe malnutrition in context of chronic illness  INTERVENTION:   Nepro Shake po BID, each supplement provides 425 kcal and 19 grams protein   Discussed with pt the importance of getting adequate protein intake.  Renal MVI Daily   Monitor Bowel function and need for scheduled bowel regimen if no BM in the next 2 days.  Liberalize Renal diet to Regular. FLUID RESTRICTION REMAINS.  NUTRITION DIAGNOSIS:   Severe Malnutrition related to chronic illness as evidenced by severe fat depletion, severe muscle depletion.   GOAL:   Patient will meet greater than or equal to 90% of their needs   MONITOR:   PO intake, Supplement acceptance, Weight trends  REASON FOR ASSESSMENT:   Consult Assessment of nutrition requirement/status  ASSESSMENT:   PMH: ESRD; HD, T2DM, HTN, CAD, COPD Admitted with: right femur fracture Presented with: N/V, fall at [facility]   Missed HD Monday due to weakness and fall   1/26- admitted for fall and weakness 1/27- femur fracture/hip repair surgery   Pt underwent HD femur neck fixation yesterday.   Spoke to pt in room sitting up in chair. Pt provided nutrition hx. He is a resident of [place] and consistently receives 3 balanced meals a day. He reports a good appetite at meal times and he enjoys snacking on peanut butter crackers and saltines in between meals. He does not drink much but eats ice often throughout the day. He drinks chocolate or strawberry Nepro shakes at [place]. Since admitted pt reports to eating well and had grits, fried potatoes, french toast, and milk for breakfast.   Pt has top teeth dentures and he has poor dentition on the bottom missing one side of his teeth. Pt has no problems chewing or swallowing, he says that the food at [facility] is easy to chew.   He attends outpatient HD 3 time a week MWF. He reports the treatments are tolerated well and has been receiving HD for 2  years. He is unable to quantify his EDW or his fluid pull but does endorse feeling wiped out after HD treatments. Discussed the importance of MBD management to prevent further fractures/falls. Also discussed the importance of limiting fluid intake to avoid excessive IDWG.   Pt reported UBW is around 54.5 kg. Pt EDW is 57.6 kg. No edema appreciated on exam.   Last BM PTA, will monitor as this can affect lab trends.   Significant muscle and fat depletion on exam, meets criteria for severe malnutrition. Lab trends appear stable. Will liberalize diet to maximize PO intake and promote wound healing. Fluid restriction remains in place.   Admit weight: 58.9 kg Current weight: 56.7 kg    Nutritionally Relevant Medications: Scheduled Meds:  [START ON 06/26/2024] doxercalciferol   1 mcg Intravenous Q M,W,F-HD   pantoprazole  (PROTONIX ) IV  40 mg Intravenous Q12H   sevelamer  carbonate  1,600 mg Oral TID WC   And   sevelamer  carbonate  800 mg Oral With snacks   Continuous Infusions:  anticoagulant sodium citrate       Labs Reviewed: K: 5.2 BUN: 58 Creatinine: 8.97 GFR: 6  NUTRITION - FOCUSED PHYSICAL EXAM:  Flowsheet Row Most Recent Value  Orbital Region Severe depletion  Upper Arm Region Severe depletion  Thoracic and Lumbar Region Severe depletion  Buccal Region Severe depletion  Temple Region Moderate depletion  Clavicle Bone Region Severe depletion  Clavicle and Acromion Bone Region Severe depletion  Scapular Bone Region Severe depletion  Dorsal Hand  Severe depletion  Patellar Region Severe depletion  Anterior Thigh Region Severe depletion  Posterior Calf Region Severe depletion  Edema (RD Assessment) None  Hair Reviewed  Eyes Reviewed  Mouth Reviewed  [top dentures, poor bottom dentition]  Skin Reviewed  Nails Reviewed    Diet Order:   Diet Order             Diet renal with fluid restriction Fluid restriction: 1200 mL Fluid; Room service appropriate? Yes; Fluid  consistency: Thin  Diet effective now                   EDUCATION NEEDS:   Education needs have been addressed  Skin:  Skin Assessment: Reviewed RN Assessment  Last BM:  unknown  Height:   Ht Readings from Last 1 Encounters:  06/25/24 5' 10 (1.778 m)    Weight:   Wt Readings from Last 1 Encounters:  06/25/24 56.7 kg    Ideal Body Weight:     BMI:  Body mass index is 17.94 kg/m.  Estimated Nutritional Needs:   Kcal:  1600-1800  Protein:  85-100 g  Fluid:  1 L + UOP    Con Friends, Dietetic Intern

## 2024-06-25 NOTE — Progress Notes (Signed)
 Pt. Came in a alert and oriented with a little bit complain of pain on his hip. Adjusted his position. Consent signed and on file. Started with no complications  UF removal: Tx duration:3.5 hours  Access used: Left AVF Access issue: None  During HD, pt. Complained of pain on his hip. See MAR. 02 sat is also dropping to 88 to 90s. Hooked to 02 at 2LPM via nasal cannula  Tx completed and tolerated Pressure dressing applied Endorsed to floor nurse Transported to room.   Kiril Hippe Rubi Nickole Adamek, RN [facility]

## 2024-06-25 NOTE — Op Note (Signed)
" ° °  Date of Surgery: 06/25/2024  INDICATIONS: [name] is a 74 y.o.-year-old male who sustained a hip fracture; he was indicated for open reduction and internal fixation due to nature of the fracture and came to the operating room today for this procedure. The patient did consent to the procedure after discussion of the risks and benefits.   PREOPERATIVE DIAGNOSIS: right nondisplaced femoral neck fracture  POSTOPERATIVE DIAGNOSIS: Same.  PROCEDURE: Open treatment of proximal end of femur, neck with internal fixation. CPT [phone].  SURGEON: N. Ozell Cummins, M.D.  ASSIST: None  ANESTHESIA: general  IV FLUIDS AND URINE: See anesthesia.  ESTIMATED BLOOD LOSS: 5 mL.  IMPLANTS: [employer] 6.5 mm cannulated screws, 100 mm, 95 mm, 90 mm  DRAINS: None.  COMPLICATIONS: see description of procedure.  DESCRIPTION OF PROCEDURE: The patient was brought to the operating room and placed supine on the operating table.  The patient had been signed prior to the procedure and this was documented. The patient had the anesthesia placed by the anesthesiologist.  The prep verification and incision time-outs were performed to confirm that this was the correct patient, site, side and location. The patient had SCDs in place. The patient did receive antibiotics prior to the incision and was redosed during the procedure as needed at indicated intervals.  The patient's well leg was placed in an extended position.  The operative leg was placed in gentle traction and also well-padded.  Care was taken to confirm radiographs before prepping and draping. The hip was prepped and draped in the standard fashion.  A 2 cm incision was made on the lateral thigh.  Screws were placed using the following technique.  The inferior guide pin was first placed at the level of the lesser and confirmed to be in the proper location on both AP and lateral views.  I then placed the anterior and posterior guide pins using the same  technique.  The cannulated depth gauge was used for measurement.  A cannulated drill was then advanced over each guide pin to the appropriate depths.   The screws were then placed over the guide pins first by power and then finished by hand.  The approach-withdraw phenomenon was visualized under live fluoroscopy to confirm that all screws were of the appropriate length and in the head.   The wound was copiously irrigated with saline and then layered closure was performed. The wounds were cleaned and dried a final time and a sterile dressing. The patient was then transferred back to the bed and left the operating room in stable condition.  All sponge and instrument counts were correct.  POSTOPERATIVE PLAN: Mr. Wenke will be 25% partial weight bearing; he will return for staple removal in 2 weeks. Mr. Sentell may resume his plavix  tomorrow for DVT prophylaxis.  "

## 2024-06-25 NOTE — Consult Note (Signed)
 Reason for Consult:Right hip fx Referring Physician: Lonni Moose Time called: 9250 Time at bedside: 0900   Shawn Collins is an 74 y.o. male.  HPI: Cameo fell while trying to get out of bed at the facility where he resides. He had immediate right hip pain and could not get up. He was brought to the ED where x-rays showed a right hip fx and orthopedic surgery was consulted. He uses a RW for ambulation.  Past Medical History:  Diagnosis Date   CAD (coronary artery disease), native coronary artery    s/p NSTEMI with cath showing severe 3VD s/p  CABG x 3 with LIMA to LAD, SVG to OM1/OM2 and SVG D1 by Dr. Army on 10/26/16.   COPD (chronic obstructive pulmonary disease) (HCC) 09/10/2014   CVA (cerebral vascular accident) (HCC) 12/18/2021   Diabetes mellitus without complication (HCC)    Type II   ESRD (end stage renal disease) (HCC)    Hyperlipidemia LDL goal <70 04/17/2017   Hypertension     Past Surgical History:  Procedure Laterality Date   A/V SHUNT INTERVENTION N/A 08/17/2023   Procedure: A/V SHUNT INTERVENTION;  Surgeon: Norine Manuelita LABOR, MD;  Location: [facility] INVASIVE CV LAB;  Service: Cardiovascular;  Laterality: N/A;   AV FISTULA PLACEMENT Left 12/21/2021   Procedure: INSERTION OF LEFT ARM ARTERIOVENOUS (AV) GORE-TEX GRAFT;  Surgeon: Eliza Lonni RAMAN, MD;  Location: [facility] OR;  Service: Vascular;  Laterality: Left;   CORONARY ARTERY BYPASS GRAFT N/A 10/26/2016   Procedure: CORONARY ARTERY BYPASS GRAFTING (CABG) x 4 USING LEFT INTERNAL MAMMARY ARTERY TO LAD AND ENDOSCOPICALLY HARVESTED GREATER SAPHENOUS VEIN TO OM 1, 2 AND TO DIAG;  Surgeon: Army Dallas NOVAK, MD;  Location: [facility] OR;  Service: Open Heart Surgery;  Laterality: N/A;   ENDOVEIN HARVEST OF GREATER SAPHENOUS VEIN Right 10/26/2016   Procedure: ENDOVEIN HARVEST OF GREATER SAPHENOUS VEIN;  Surgeon: Army Dallas NOVAK, MD;  Location: [facility] OR;  Service: Open Heart Surgery;  Laterality: Right;   IR THORACENTESIS RIGHT ASP  PLEURAL SPACE W/IMG GUIDE  11/03/2016   LEFT HEART CATH AND CORONARY ANGIOGRAPHY N/A 10/21/2016   Procedure: Left Heart Cath and Coronary Angiography;  Surgeon: Jordan, Peter M, MD;  Location: [facility] INVASIVE CV LAB;  Service: Cardiovascular;  Laterality: N/A;   TEE WITHOUT CARDIOVERSION N/A 10/26/2016   Procedure: TRANSESOPHAGEAL ECHOCARDIOGRAM (TEE);  Surgeon: Army Dallas NOVAK, MD;  Location: [facility] OR;  Service: Open Heart Surgery;  Laterality: N/A;   THROMBECTOMY W/ EMBOLECTOMY Left 05/09/2024   Procedure: THROMBECTOMY AND REVISION OF LEFT FOREARM ARTERIOVENOUS GORE-TEX GRAFT, USING GORE-TEX 7 MM STRETCH AS INTERPOSITION GRAFT;  Surgeon: Pearline Norman RAMAN, MD;  Location: [facility] OR;  Service: Vascular;  Laterality: Left;    Family History  Problem Relation Age of Onset   Diabetes Mellitus II Mother    Stroke Father    Hypertension Father    Diabetes Mellitus II Sister     Social History:  reports that he quit smoking about 54 years ago. His smoking use included cigarettes. He started smoking about 62 years ago. He has a 2 pack-year smoking history. He has never used smokeless tobacco. He reports that he does not currently use alcohol. He reports that he does not currently use drugs.  Allergies: Allergies[1]  Medications: I have reviewed the patient's current medications.  Results for orders placed or performed during the hospital encounter of 06/24/24 (from the past 48 hours)  CBC with Differential     Status: Abnormal   Collection  Time: 06/24/24  2:47 PM  Result Value Ref Range   WBC 7.2 4.0 - 10.5 K/uL   RBC 3.37 (L) 4.22 - 5.81 MIL/uL   Hemoglobin 10.6 (L) 13.0 - 17.0 g/dL   HCT 66.7 (L) 60.9 - 47.9 %   MCV 98.5 80.0 - 100.0 fL   MCH 31.5 26.0 - 34.0 pg   MCHC 31.9 30.0 - 36.0 g/dL   RDW 83.3 (H) 88.4 - 84.4 %   Platelets 242 150 - 400 K/uL   nRBC 0.0 0.0 - 0.2 %   Neutrophils Relative % 87 %   Neutro Abs 6.1 1.7 - 7.7 K/uL   Lymphocytes Relative 6 %   Lymphs Abs 0.5 (L) 0.7 - 4.0 K/uL    Monocytes Relative 7 %   Monocytes Absolute 0.5 0.1 - 1.0 K/uL   Eosinophils Relative 0 %   Eosinophils Absolute 0.0 0.0 - 0.5 K/uL   Basophils Relative 0 %   Basophils Absolute 0.0 0.0 - 0.1 K/uL   Immature Granulocytes 0 %   Abs Immature Granulocytes 0.03 0.00 - 0.07 K/uL    Comment: Performed at [facility] Lab, [address], [place], [place] [address]  Type and screen [facility]     Status: None   Collection Time: 06/24/24  2:47 PM  Result Value Ref Range   ABO/RH(D) O POS    Antibody Screen NEG    Sample Expiration      06/27/2024,2359 Performed at [facility] Lab, [address], [place], [place] [address]   Comprehensive metabolic panel     Status: Abnormal   Collection Time: 06/24/24  2:47 PM  Result Value Ref Range   Sodium 139 135 - 145 mmol/L   Potassium 4.9 3.5 - 5.1 mmol/L    Comment: HEMOLYSIS AT THIS LEVEL MAY AFFECT RESULT   Chloride 98 98 - 111 mmol/L   CO2 25 22 - 32 mmol/L   Glucose, Bld 139 (H) 70 - 99 mg/dL    Comment: Glucose reference range applies only to samples taken after fasting for at least 8 hours.   BUN 51 (H) 8 - 23 mg/dL   Creatinine, Ser 2.16 (H) 0.61 - 1.24 mg/dL   Calcium  9.7 8.9 - 10.3 mg/dL   Total Protein 7.5 6.5 - 8.1 g/dL   Albumin  4.3 3.5 - 5.0 g/dL   AST 37 15 - 41 U/L    Comment: HEMOLYSIS AT THIS LEVEL MAY AFFECT RESULT   ALT 15 0 - 44 U/L   Alkaline Phosphatase 88 38 - 126 U/L   Total Bilirubin 0.6 0.0 - 1.2 mg/dL   GFR, Estimated 7 (L) >60 mL/min    Comment: (NOTE) Calculated using the CKD-EPI Creatinine Equation (2021)    Anion gap 16 (H) 5 - 15    Comment: Performed at [facility] Lab, [address] [place], [place] [address]  Lipase, blood     Status: None   Collection Time: 06/24/24  2:47 PM  Result Value Ref Range   Lipase 27 11 - 51 U/L    Comment: Performed at [facility] Lab, [address] [place], [place] [address]  I-stat chem 8, ED     Status: Abnormal   Collection Time:  06/24/24  2:53 PM  Result Value Ref Range   Sodium 140 135 - 145 mmol/L   Potassium 4.3 3.5 - 5.1 mmol/L   Chloride 101 98 - 111 mmol/L   BUN 49 (H)  8 - 23 mg/dL   Creatinine, Ser 1.09 (H) 0.61 - 1.24 mg/dL   Glucose, Bld 859 (H) 70 - 99 mg/dL    Comment: Glucose reference range applies only to samples taken after fasting for at least 8 hours.   Calcium , Ion 1.10 (L) 1.15 - 1.40 mmol/L   TCO2 26 22 - 32 mmol/L   Hemoglobin 11.6 (L) 13.0 - 17.0 g/dL   HCT 65.9 (L) 60.9 - 47.9 %  I-Stat Lactic Acid     Status: None   Collection Time: 06/24/24  2:54 PM  Result Value Ref Range   Lactic Acid, Venous 0.8 0.5 - 1.9 mmol/L  I-Stat venous blood gas, ED     Status: Abnormal   Collection Time: 06/24/24  2:54 PM  Result Value Ref Range   pH, Ven 7.494 (H) 7.25 - 7.43   pCO2, Ven 36.6 (L) 44 - 60 mmHg   pO2, Ven 60 (H) 32 - 45 mmHg   Bicarbonate 28.1 (H) 20.0 - 28.0 mmol/L   TCO2 29 22 - 32 mmol/L   O2 Saturation 93 %   Acid-Base Excess 5.0 (H) 0.0 - 2.0 mmol/L   Sodium 140 135 - 145 mmol/L   Potassium 4.3 3.5 - 5.1 mmol/L   Calcium , Ion 1.11 (L) 1.15 - 1.40 mmol/L   HCT 35.0 (L) 39.0 - 52.0 %   Hemoglobin 11.9 (L) 13.0 - 17.0 g/dL   Sample type VENOUS   Protime-INR     Status: Abnormal   Collection Time: 06/24/24  3:03 PM  Result Value Ref Range   Prothrombin Time 16.8 (H) 11.4 - 15.2 seconds   INR 1.3 (H) 0.8 - 1.2    Comment: (NOTE) INR goal varies based on device and disease states. Performed at [facility] Lab, [address] [place], [place] [address]   Troponin T, High Sensitivity     Status: Abnormal   Collection Time: 06/24/24 11:03 PM  Result Value Ref Range   Troponin T High Sensitivity 158 (HH) 0 - 19 ng/L    Comment: Critical Value, Read Back and verified with Jesus LANES RN at 0007 06/25/24 [place] (NOTE) Biotin concentrations > 1000 ng/mL falsely decrease TnT results.  Serial cardiac troponin measurements are suggested.  Refer to the Links section for chest  pain algorithms and additional  guidance. Performed at [facility] Lab, [address], [place], [place] [address]   Pro Brain natriuretic peptide     Status: Abnormal   Collection Time: 06/24/24 11:03 PM  Result Value Ref Range   Pro Brain Natriuretic Peptide 17,514.0 (H) <300.0 pg/mL    Comment: (NOTE) Age Group        Cut-Points    Interpretation  < 50 years     450 pg/mL       NT-proBNP > 450 pg/mL indicates                                ADHF is likely              50 to 75 years  900 pg/mL      NT-proBNP > 900 pg/mL indicates          ADHF is likely  > 75 years      1800 pg/mL     NT-proBNP > 1800 pg/mL indicates          ADHF is likely  All ages    Results between       Indeterminate. Further clinical             300 and the cut-   information is needed to determine            point for age group   if ADHF is present.                                                             Elecsys proBNP II/ Elecsys proBNP II STAT           Cut-Point                       Interpretation  300 pg/mL                    NT-proBNP <300pg/mL indicates                             ADHF is not likely  Performed at [facility] Lab, [address] [place], [place] [address]   TSH     Status: None   Collection Time: 06/24/24 11:03 PM  Result Value Ref Range   TSH 3.490 0.350 - 4.500 uIU/mL    Comment: Performed at [facility] Lab, [address], [place], [place] [address]  T4, free     Status: None   Collection Time: 06/24/24 11:03 PM  Result Value Ref Range   Free T4 1.29 0.80 - 2.00 ng/dL    Comment: Performed at [facility], [address] [place], [place] [address]  Vitamin B12     Status: None   Collection Time: 06/24/24 11:03 PM  Result Value Ref Range   Vitamin B-12 778 180 - 914 pg/mL    Comment: Performed at [facility] Lab, [address], [place], [place] [address]  Folate     Status: None   Collection Time: 06/24/24 11:03  PM  Result Value Ref Range   Folate 10.7 >5.9 ng/mL    Comment: Performed at [facility], [address], [place], [place] [address]  Iron and TIBC     Status: Abnormal   Collection Time: 06/24/24 11:03 PM  Result Value Ref Range   Iron 27 (L) 45 - 182 ug/dL   TIBC 723 749 - 549 ug/dL   Saturation Ratios 10 (L) 17.9 - 39.5 %   UIBC 249 ug/dL    Comment: Performed at [facility] Lab, [address] [place], [place] [address]  Ferritin     Status: Abnormal   Collection Time: 06/24/24 11:03 PM  Result Value Ref Range   Ferritin 953 (H) 24 - 336 ng/mL    Comment: Performed at [facility] Lab, [address], [place], [place] [address]  Reticulocytes     Status: Abnormal   Collection Time: 06/24/24 11:03 PM  Result Value Ref Range   Retic Ct Pct 1.6 0.4 - 3.1 %   RBC. 3.39 (L) 4.22 - 5.81 MIL/uL   Retic Count, Absolute 55.6 19.0 - 186.0 K/uL   Immature Retic Fract 8.5 2.3 - 15.9 %    Comment: Performed at [facility] Lab, [address] [place], [place] [address]  Hepatitis B surface antigen     Status: None   Collection Time: 06/24/24 11:03 PM  Result Value Ref Range   Hepatitis B Surface Ag NON REACTIVE NON REACTIVE    Comment: Performed at [facility] Lab, [address] [place], [place] [address]  I-Stat Lactic Acid     Status: None   Collection Time: 06/24/24 11:16 PM  Result Value Ref Range   Lactic Acid, Venous 1.0 0.5 - 1.9 mmol/L  Renal function panel     Status: Abnormal   Collection Time: 06/25/24  5:22 AM  Result Value Ref Range   Sodium 139 135 - 145 mmol/L   Potassium 5.2 (H) 3.5 - 5.1 mmol/L   Chloride 99 98 - 111 mmol/L   CO2 26 22 - 32 mmol/L   Glucose, Bld 143 (H) 70 - 99 mg/dL    Comment: Glucose reference range applies only to samples taken after fasting for at least 8 hours.   BUN 58 (H) 8 - 23 mg/dL   Creatinine, Ser 1.02 (H) 0.61 - 1.24 mg/dL   Calcium  9.7 8.9 - 10.3 mg/dL   Phosphorus 4.5 2.5 - 4.6 mg/dL   Albumin  4.3 3.5 - 5.0  g/dL   GFR, Estimated 6 (L) >60 mL/min    Comment: (NOTE) Calculated using the CKD-EPI Creatinine Equation (2021)    Anion gap 14 5 - 15    Comment: Performed at [facility] Lab, [address] [place], [place] [address]    MR BRAIN WO CONTRAST Result Date: 06/24/2024 CLINICAL DATA:  Initial evaluation for acute syncope/presyncope, stroke suspected. EXAM: MRI HEAD WITHOUT CONTRAST TECHNIQUE: Multiplanar, multiecho pulse sequences of the brain and surrounding structures were obtained without intravenous contrast. COMPARISON:  CT from earlier the same day. FINDINGS: Brain: Mild age-related cerebral atrophy. Patchy and confluent T2/FLAIR hyperintensity involving the periventricular deep white matter, consistent with chronic small vessel ischemic disease, moderate in nature. Few small remote lacunar infarcts present about the left basal ganglia, left frontal corona radiata, and thalami. Few probable tiny remote cerebellar infarcts noted. No evidence for acute or subacute infarct. Gray-white matter differentiation maintained. No areas of chronic cortical infarction. No visible acute intracranial hemorrhage. Suspected single chronic microhemorrhage at the left cerebellum noted, of doubtful significance in isolation. No mass lesion, midline shift or mass effect. Mild ventricular prominence related global parenchymal volume loss without hydrocephalus. No extra-axial fluid collection. Pituitary gland within normal limits. Vascular: Major intracranial vascular flow voids are maintained. Skull and upper cervical spine: Craniocervical junction within normal limits. Bone marrow signal intensity overall within normal limits. No scalp soft tissue abnormality. Sinuses/Orbits: Globes and orbital soft tissues within normal limits. Mild mucosal thickening about the ethmoidal air cells. Paranasal sinuses are otherwise largely clear. No significant mastoid effusion. Other: None. IMPRESSION: 1. No acute intracranial  abnormality. 2. Age-related cerebral atrophy with moderate chronic microvascular ischemic disease, with a few scattered remote lacunar infarcts as above. Electronically Signed   By: Morene Hoard M.D.   On: 06/24/2024 19:55   CT Head Wo Contrast Result Date: 06/24/2024 CLINICAL DATA:  Head trauma EXAM: CT HEAD WITHOUT CONTRAST TECHNIQUE: Contiguous axial images were obtained from the base of the skull through the vertex without intravenous contrast. RADIATION DOSE REDUCTION: This exam was performed according to the departmental dose-optimization program which includes automated exposure control, adjustment of the mA and/or kV according to patient size and/or use of iterative reconstruction technique.  COMPARISON:  05/21/2024 FINDINGS: Brain: No acute territorial infarction, hemorrhage or intracranial mass. Atrophy and chronic small vessel ischemic changes of the white matter. 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 The ventricles are stable in size. Vascular: No hyperdense vessels. Vertebral and carotid vascular calcification Skull: Normal. Negative for fracture or focal lesion. Sinuses/Orbits: No acute finding. Other: None IMPRESSION: 1. No CT evidence for acute intracranial abnormality. 2. Atrophy and chronic small vessel ischemic changes of the white matter. Electronically Signed   By: Luke Bun M.D.   On: 06/24/2024 16:26   DG Hip Unilat W or Wo Pelvis 2-3 Views Right Result Date: 06/24/2024 CLINICAL DATA:  Fall EXAM: DG HIP (WITH OR WITHOUT PELVIS) 2-3V RIGHT COMPARISON:  CT 06/24/2024 FINDINGS: SI joints are non widened. Vascular calcifications. Pubic symphysis and rami appear intact. Acute nondisplaced right femoral neck fracture. No femoral head dislocation. IMPRESSION: Acute nondisplaced right femoral neck fracture. Electronically Signed   By: Luke Bun M.D.   On: 06/24/2024 16:23   DG Chest 1 View Result Date: 06/24/2024 CLINICAL DATA:  Shortness of breath EXAM:  CHEST  1 VIEW COMPARISON:  05/24/2024 FINDINGS: Post sternotomy changes. Mild cardiomegaly. No acute airspace disease, pleural effusion or pneumothorax. IMPRESSION: No active disease. Mild cardiomegaly. Electronically Signed   By: Luke Bun M.D.   On: 06/24/2024 16:21   CT Cervical Spine Wo Contrast Result Date: 06/24/2024 CLINICAL DATA:  Neck trauma fall EXAM: CT CERVICAL SPINE WITHOUT CONTRAST TECHNIQUE: Multidetector CT imaging of the cervical spine was performed without intravenous contrast. Multiplanar CT image reconstructions were also generated. RADIATION DOSE REDUCTION: This exam was performed according to the departmental dose-optimization program which includes automated exposure control, adjustment of the mA and/or kV according to patient size and/or use of iterative reconstruction technique. COMPARISON:  None Available. FINDINGS: Alignment: Straightening of the cervical spine. No subluxation. Facet alignment is normal Skull base and vertebrae: No acute fracture. No primary bone lesion or focal pathologic process. Soft tissues and spinal canal: No prevertebral fluid or swelling. No visible canal hematoma. Disc levels: Advanced disc space narrowing and degenerative change C2 through C7. Multilevel facet degenerative change and foraminal narrowing. Interbody ankylosis C4-C5 with fusion of the right greater than left facets at this level. No high-grade bony canal stenosis Upper chest: Negative. Other: None IMPRESSION: Straightening of the cervical spine with advanced degenerative changes. No acute osseous abnormality. Electronically Signed   By: Luke Bun M.D.   On: 06/24/2024 16:20   CT CHEST ABDOMEN PELVIS WO CONTRAST Result Date: 06/24/2024 CLINICAL DATA:  Sepsis, fall from bed, right-sided hip pain. On blood thinners. EXAM: CT CHEST, ABDOMEN AND PELVIS WITHOUT CONTRAST TECHNIQUE: Multidetector CT imaging of the chest, abdomen and pelvis was performed following the standard protocol without  IV contrast. RADIATION DOSE REDUCTION: This exam was performed according to the departmental dose-optimization program which includes automated exposure control, adjustment of the mA and/or kV according to patient size and/or use of iterative reconstruction technique. COMPARISON:  CT abdomen pelvis 05/24/2024 and CT chest 02/10/2024. FINDINGS: CT CHEST FINDINGS Cardiovascular: Atherosclerotic calcification of the aorta, aortic valve and coronary arteries. Enlarged pulmonic trunk and heart. No pericardial effusion. Mediastinum/Nodes: No pathologically enlarged mediastinal or axillary lymph nodes. Hilar regions are difficult to definitively evaluate without IV contrast. Bilateral gynecomastia. Esophagus is grossly unremarkable. Lungs/Pleura: Image quality is degraded by expiratory phase imaging and respiratory motion. Mild dependent atelectasis bilaterally with small bilateral pleural effusions. 9 mm apical segment right upper lobe ground-glass nodule (  5/20), unchanged from 02/10/2024. Airway is unremarkable. Musculoskeletal: Old right rib fractures.  No acute fracture. CT ABDOMEN PELVIS FINDINGS Hepatobiliary: Patient's arms create streak artifact in the abdomen, degrading image quality. Liver is grossly unremarkable. Tiny gallstones. No biliary ductal dilatation. Pancreas: Negative. Spleen: Negative. Adrenals/Urinary Tract: Adrenal glands are unremarkable. Renal vascular calcifications and/or stones in the kidneys bilaterally. Kidneys are otherwise unremarkable. Ureters are decompressed. Bladder is grossly unremarkable. Stomach/Bowel: Stomach, small bowel, appendix and colon are grossly unremarkable. Moderate stool burden. Vascular/Lymphatic: Atherosclerotic calcification of the aorta. No pathologically enlarged lymph nodes. Reproductive: Prostate is mildly enlarged. Other: Mild presacral edema. No free fluid. Small umbilical hernia contains fat. Musculoskeletal: Osteopenia. Sarcopenia. Degenerative changes in the  spine. There is a nondisplaced right femoral neck fracture (coronal image 44 and sagittal image 37). No additional evidence of an acute fracture. IMPRESSION: 1. Nondisplaced right femoral neck fracture. 2. No additional evidence of acute trauma in the chest, abdomen or pelvis. 3. Small bilateral pleural effusions with dependent atelectasis in both lower lobes. Difficult to exclude aspiration. 4. 9 mm apical segment right upper lobe ground-glass nodule, unchanged from 02/10/2024. This can be reassessed in 2 years, as clinically indicated. This recommendation follows the consensus statement: Guidelines for Management of Small Pulmonary Nodules Detected on CT Images: From the Fleischner Society 2017; Radiology 2017; 284:228-243. 5. Cholelithiasis. 6. Renal vascular calcifications and/or stones. 7. Enlarged prostate. 8. Aortic atherosclerosis (ICD10-I70.0). Coronary artery calcification. 9. Enlarged pulmonic trunk, indicative of pulmonary arterial hypertension. Electronically Signed   By: Newell Eke M.D.   On: 06/24/2024 16:08    Review of Systems  HENT:  Negative for ear discharge, ear pain, hearing loss and tinnitus.   Eyes:  Negative for photophobia and pain.  Respiratory:  Negative for cough and shortness of breath.   Cardiovascular:  Negative for chest pain.  Gastrointestinal:  Negative for abdominal pain, nausea and vomiting.  Genitourinary:  Negative for dysuria, flank pain, frequency and urgency.  Musculoskeletal:  Positive for arthralgias (Right hip). Negative for back pain, myalgias and neck pain.  Neurological:  Negative for dizziness and headaches.  Hematological:  Does not bruise/bleed easily.  Psychiatric/Behavioral:  The patient is not nervous/anxious.    Blood pressure (!) 177/71, pulse 75, temperature 98.1 F (36.7 C), resp. rate (!) 21, height 5' 10 (1.778 m), weight 58.9 kg, SpO2 92%. Physical Exam Constitutional:      General: He is not in acute distress.    Appearance: He  is well-developed. He is not diaphoretic.  HENT:     Head: Normocephalic and atraumatic.  Eyes:     General: No scleral icterus.       Right eye: No discharge.        Left eye: No discharge.     Conjunctiva/sclera: Conjunctivae normal.  Cardiovascular:     Rate and Rhythm: Normal rate and regular rhythm.  Pulmonary:     Effort: Pulmonary effort is normal. No respiratory distress.  Musculoskeletal:     Cervical back: Normal range of motion.     Comments: RLE No traumatic wounds, ecchymosis, or rash  Severe TTP hip  No knee or ankle effusion  Knee stable to varus/ valgus and anterior/posterior stress  Sens DPN, SPN, TN intact  Motor EHL, ext, flex, evers 5/5  DP 2+, PT 2+, No significant edema  Skin:    General: Skin is warm and dry.  Neurological:     Mental Status: He is alert.  Psychiatric:  Mood and Affect: Mood normal.        Behavior: Behavior normal.     Assessment/Plan: Right hip fx -- Plan cannulated hip pinning today with Dr. Jerri. Please keep NPO.    Ozell DOROTHA Ned, PA-C Orthopedic Surgery [phone] 06/25/2024, 9:06 AM     [1] No Known Allergies

## 2024-06-25 NOTE — Anesthesia Preprocedure Evaluation (Addendum)
"                                    Anesthesia Evaluation  Patient identified by MRN, date of birth, ID band Patient awake    Reviewed: Allergy & Precautions, NPO status , Patient's Chart, lab work & pertinent test results  History of Anesthesia Complications Negative for: history of anesthetic complications  Airway Mallampati: I  TM Distance: >3 FB Neck ROM: Full    Dental  (+) Teeth Intact, Missing, Poor Dentition   Pulmonary COPD, former smoker   breath sounds clear to auscultation       Cardiovascular hypertension, Pt. on medications + CAD, + Past MI and + CABG   Rhythm:Regular Rate:Normal  ECHO: 1. Left ventricular ejection fraction, by estimation, is 70 to 75% . The left ventricle has hyperdynamic function. The left ventricle has no regional wall motion abnormalities. There is mild concentric left ventricular hypertrophy. Left ventricular diastolic parameters are indeterminate. 2. Right ventricular systolic function is normal. The right ventricular size is normal. 3. The mitral valve is normal in structure. Trivial mitral valve regurgitation. No evidence of mitral stenosis. Moderate mitral annular calcification. 4. The aortic valve is tricuspid. There is mild calcification of the aortic valve. Aortic valve regurgitation is not visualized. Aortic valve sclerosis/ calcification is present, without any evidence of aortic stenosis. 5. The inferior vena cava is normal in size with greater than 50% respiratory variability, suggesting right atrial pressure of 3 mmHg.   Neuro/Psych CVA    GI/Hepatic negative GI ROS, Neg liver ROS,,,  Endo/Other  diabetes    Renal/GU ESRFRenal disease     Musculoskeletal   Abdominal   Peds  Hematology  (+) Blood dyscrasia (Plavix ), anemia INR: 1.3   Anesthesia Other Findings Right Hip Fracture  Reproductive/Obstetrics                              Anesthesia Physical Anesthesia Plan  ASA:  3  Anesthesia Plan: General   Post-op Pain Management:    Induction: Intravenous  PONV Risk Score and Plan: 2  Airway Management Planned: Oral ETT  Additional Equipment: None  Intra-op Plan:   Post-operative Plan:   Informed Consent: I have reviewed the patients History and Physical, chart, labs and discussed the procedure including the risks, benefits and alternatives for the proposed anesthesia with the patient or authorized representative who has indicated his/her understanding and acceptance.   Patient has DNR.  Discussed DNR with patient and Suspend DNR.   Dental advisory given  Plan Discussed with: CRNA  Anesthesia Plan Comments:          Anesthesia Quick Evaluation  "

## 2024-06-25 NOTE — Progress Notes (Signed)
 " [facility] Progress Note   Subjective:   Seen on HD. Denies any pain right now. Denies SOB, CP. Reports nausea.   Objective Vitals:   06/25/24 0545 06/25/24 0614 06/25/24 0800 06/25/24 0807  BP:  (!) 179/79 (!) 183/72 (!) 177/71  Pulse:  76 72 75  Resp:  15 15 (!) 21  Temp: 97.7 F (36.5 C) 97.7 F (36.5 C) 98.1 F (36.7 C)   TempSrc: Oral Axillary    SpO2:  95% 91% 92%  Weight:   58.9 kg   Height:       Physical Exam General: Alert male in NAD Heart: RRR, no murmur Lungs: CTA anteriorly, respirations unlabored on RA Abdomen: Soft, non-distended Extremities: no peripheral edema Dialysis Access: AVG accessed  Additional Objective Labs: Basic Metabolic Panel: Recent Labs  Lab 06/24/24 1447 06/24/24 1453 06/24/24 1454 06/25/24 0522  NA 139 140 140 139  K 4.9 4.3 4.3 5.2*  CL 98 101  --  99  CO2 25  --   --  26  GLUCOSE 139* 140*  --  143*  BUN 51* 49*  --  58*  CREATININE 7.83* 8.90*  --  8.97*  CALCIUM  9.7  --   --  9.7  PHOS  --   --   --  4.5   Liver Function Tests: Recent Labs  Lab 06/24/24 1447 06/25/24 0522  AST 37  --   ALT 15  --   ALKPHOS 88  --   BILITOT 0.6  --   PROT 7.5  --   ALBUMIN  4.3 4.3   Recent Labs  Lab 06/24/24 1447  LIPASE 27   CBC: Recent Labs  Lab 06/24/24 1447 06/24/24 1453 06/24/24 1454  WBC 7.2  --   --   NEUTROABS 6.1  --   --   HGB 10.6* 11.6* 11.9*  HCT 33.2* 34.0* 35.0*  MCV 98.5  --   --   PLT 242  --   --    Blood Culture    Component Value Date/Time   SDES BLOOD RIGHT ARM 05/26/2024 1130   SDES BLOOD RIGHT HAND 05/26/2024 1130   SPECREQUEST  05/26/2024 1130    BOTTLES DRAWN AEROBIC AND ANAEROBIC Blood Culture adequate volume   SPECREQUEST  05/26/2024 1130    BOTTLES DRAWN AEROBIC ONLY Blood Culture results may not be optimal due to an inadequate volume of blood received in culture bottles   CULT  05/26/2024 1130    NO GROWTH 5 DAYS Performed at [facility], [address], [place], [place] [address]    CULT  05/26/2024 1130    NO GROWTH 5 DAYS Performed at [facility], [address], [place], [place] [address]    REPTSTATUS 05/31/2024 FINAL 05/26/2024 1130   REPTSTATUS 05/31/2024 FINAL 05/26/2024 1130    Cardiac Enzymes: No results for input(s): CKTOTAL, CKMB, CKMBINDEX, TROPONINI in the last 168 hours. CBG: No results for input(s): GLUCAP in the last 168 hours. Iron Studies:  Recent Labs    06/24/24 2303  IRON 27*  TIBC 276  FERRITIN 953*   @lablastinr3 @ Studies/Results: MR BRAIN WO CONTRAST Result Date: 06/24/2024 CLINICAL DATA:  Initial evaluation for acute syncope/presyncope, stroke suspected. EXAM: MRI HEAD WITHOUT CONTRAST TECHNIQUE: Multiplanar, multiecho pulse sequences of the brain and surrounding structures were obtained without intravenous contrast. COMPARISON:  CT from earlier the same day. FINDINGS: Brain: Mild age-related cerebral atrophy. Patchy and confluent T2/FLAIR hyperintensity involving the periventricular deep  white matter, consistent with chronic small vessel ischemic disease, moderate in nature. Few small remote lacunar infarcts present about the left basal ganglia, left frontal corona radiata, and thalami. Few probable tiny remote cerebellar infarcts noted. No evidence for acute or subacute infarct. Gray-white matter differentiation maintained. No areas of chronic cortical infarction. No visible acute intracranial hemorrhage. Suspected single chronic microhemorrhage at the left cerebellum noted, of doubtful significance in isolation. No mass lesion, midline shift or mass effect. Mild ventricular prominence related global parenchymal volume loss without hydrocephalus. No extra-axial fluid collection. Pituitary gland within normal limits. Vascular: Major intracranial vascular flow voids are maintained. Skull and upper cervical spine: Craniocervical junction within normal limits. Bone marrow signal intensity overall  within normal limits. No scalp soft tissue abnormality. Sinuses/Orbits: Globes and orbital soft tissues within normal limits. Mild mucosal thickening about the ethmoidal air cells. Paranasal sinuses are otherwise largely clear. No significant mastoid effusion. Other: None. IMPRESSION: 1. No acute intracranial abnormality. 2. Age-related cerebral atrophy with moderate chronic microvascular ischemic disease, with a few scattered remote lacunar infarcts as above. Electronically Signed   By: Morene Hoard M.D.   On: 06/24/2024 19:55   CT Head Wo Contrast Result Date: 06/24/2024 CLINICAL DATA:  Head trauma EXAM: CT HEAD WITHOUT CONTRAST TECHNIQUE: Contiguous axial images were obtained from the base of the skull through the vertex without intravenous contrast. RADIATION DOSE REDUCTION: This exam was performed according to the departmental dose-optimization program which includes automated exposure control, adjustment of the mA and/or kV according to patient size and/or use of iterative reconstruction technique. COMPARISON:  05/21/2024 FINDINGS: Brain: No acute territorial infarction, hemorrhage or intracranial mass. Atrophy and chronic small vessel ischemic changes of the white matter. 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 The ventricles are stable in size. Vascular: No hyperdense vessels. Vertebral and carotid vascular calcification Skull: Normal. Negative for fracture or focal lesion. Sinuses/Orbits: No acute finding. Other: None IMPRESSION: 1. No CT evidence for acute intracranial abnormality. 2. Atrophy and chronic small vessel ischemic changes of the white matter. Electronically Signed   By: Luke Bun M.D.   On: 06/24/2024 16:26   DG Hip Unilat W or Wo Pelvis 2-3 Views Right Result Date: 06/24/2024 CLINICAL DATA:  Fall EXAM: DG HIP (WITH OR WITHOUT PELVIS) 2-3V RIGHT COMPARISON:  CT 06/24/2024 FINDINGS: SI joints are non widened. Vascular calcifications. Pubic symphysis and rami  appear intact. Acute nondisplaced right femoral neck fracture. No femoral head dislocation. IMPRESSION: Acute nondisplaced right femoral neck fracture. Electronically Signed   By: Luke Bun M.D.   On: 06/24/2024 16:23   DG Chest 1 View Result Date: 06/24/2024 CLINICAL DATA:  Shortness of breath EXAM: CHEST  1 VIEW COMPARISON:  05/24/2024 FINDINGS: Post sternotomy changes. Mild cardiomegaly. No acute airspace disease, pleural effusion or pneumothorax. IMPRESSION: No active disease. Mild cardiomegaly. Electronically Signed   By: Luke Bun M.D.   On: 06/24/2024 16:21   CT Cervical Spine Wo Contrast Result Date: 06/24/2024 CLINICAL DATA:  Neck trauma fall EXAM: CT CERVICAL SPINE WITHOUT CONTRAST TECHNIQUE: Multidetector CT imaging of the cervical spine was performed without intravenous contrast. Multiplanar CT image reconstructions were also generated. RADIATION DOSE REDUCTION: This exam was performed according to the departmental dose-optimization program which includes automated exposure control, adjustment of the mA and/or kV according to patient size and/or use of iterative reconstruction technique. COMPARISON:  None Available. FINDINGS: Alignment: Straightening of the cervical spine. No subluxation. Facet alignment is normal Skull base and vertebrae: No  acute fracture. No primary bone lesion or focal pathologic process. Soft tissues and spinal canal: No prevertebral fluid or swelling. No visible canal hematoma. Disc levels: Advanced disc space narrowing and degenerative change C2 through C7. Multilevel facet degenerative change and foraminal narrowing. Interbody ankylosis C4-C5 with fusion of the right greater than left facets at this level. No high-grade bony canal stenosis Upper chest: Negative. Other: None IMPRESSION: Straightening of the cervical spine with advanced degenerative changes. No acute osseous abnormality. Electronically Signed   By: Luke Bun M.D.   On: 06/24/2024 16:20   CT  CHEST ABDOMEN PELVIS WO CONTRAST Result Date: 06/24/2024 CLINICAL DATA:  Sepsis, fall from bed, right-sided hip pain. On blood thinners. EXAM: CT CHEST, ABDOMEN AND PELVIS WITHOUT CONTRAST TECHNIQUE: Multidetector CT imaging of the chest, abdomen and pelvis was performed following the standard protocol without IV contrast. RADIATION DOSE REDUCTION: This exam was performed according to the departmental dose-optimization program which includes automated exposure control, adjustment of the mA and/or kV according to patient size and/or use of iterative reconstruction technique. COMPARISON:  CT abdomen pelvis 05/24/2024 and CT chest 02/10/2024. FINDINGS: CT CHEST FINDINGS Cardiovascular: Atherosclerotic calcification of the aorta, aortic valve and coronary arteries. Enlarged pulmonic trunk and heart. No pericardial effusion. Mediastinum/Nodes: No pathologically enlarged mediastinal or axillary lymph nodes. Hilar regions are difficult to definitively evaluate without IV contrast. Bilateral gynecomastia. Esophagus is grossly unremarkable. Lungs/Pleura: Image quality is degraded by expiratory phase imaging and respiratory motion. Mild dependent atelectasis bilaterally with small bilateral pleural effusions. 9 mm apical segment right upper lobe ground-glass nodule (5/20), unchanged from 02/10/2024. Airway is unremarkable. Musculoskeletal: Old right rib fractures.  No acute fracture. CT ABDOMEN PELVIS FINDINGS Hepatobiliary: Patient's arms create streak artifact in the abdomen, degrading image quality. Liver is grossly unremarkable. Tiny gallstones. No biliary ductal dilatation. Pancreas: Negative. Spleen: Negative. Adrenals/Urinary Tract: Adrenal glands are unremarkable. Renal vascular calcifications and/or stones in the kidneys bilaterally. Kidneys are otherwise unremarkable. Ureters are decompressed. Bladder is grossly unremarkable. Stomach/Bowel: Stomach, small bowel, appendix and colon are grossly unremarkable. Moderate  stool burden. Vascular/Lymphatic: Atherosclerotic calcification of the aorta. No pathologically enlarged lymph nodes. Reproductive: Prostate is mildly enlarged. Other: Mild presacral edema. No free fluid. Small umbilical hernia contains fat. Musculoskeletal: Osteopenia. Sarcopenia. Degenerative changes in the spine. There is a nondisplaced right femoral neck fracture (coronal image 44 and sagittal image 37). No additional evidence of an acute fracture. IMPRESSION: 1. Nondisplaced right femoral neck fracture. 2. No additional evidence of acute trauma in the chest, abdomen or pelvis. 3. Small bilateral pleural effusions with dependent atelectasis in both lower lobes. Difficult to exclude aspiration. 4. 9 mm apical segment right upper lobe ground-glass nodule, unchanged from 02/10/2024. This can be reassessed in 2 years, as clinically indicated. This recommendation follows the consensus statement: Guidelines for Management of Small Pulmonary Nodules Detected on CT Images: From the Fleischner Society 2017; Radiology 2017; 284:228-243. 5. Cholelithiasis. 6. Renal vascular calcifications and/or stones. 7. Enlarged prostate. 8. Aortic atherosclerosis (ICD10-I70.0). Coronary artery calcification. 9. Enlarged pulmonic trunk, indicative of pulmonary arterial hypertension. Electronically Signed   By: Newell Eke M.D.   On: 06/24/2024 16:08   Medications:  anticoagulant sodium citrate       Chlorhexidine  Gluconate Cloth  6 each Topical Q0600   [START ON 06/26/2024] doxercalciferol   1 mcg Intravenous Q M,W,F-HD   irbesartan   300 mg Oral Daily   pantoprazole  (PROTONIX ) IV  40 mg Intravenous Q12H   sevelamer  carbonate  1,600 mg Oral TID WC  And   sevelamer  carbonate  800 mg Oral With snacks    Dialysis Orders: [facility] [address] MWF  2k/2.5 ca BF 450 ml/min DF 800 ml/min EDW 57.6 kg  Profile 2 3 hrs and 45 minutes Access:  AV graft Meds: mircera 50 mcg every 2 weeks (last given 06/12/24); venofer 50 mg  weekly Hectorol  1 mcg three times a week  Assessment/Plan: # ESRD  - HD per MWF schedule usually - off schedule today due to ED visit - Then transition to MWF schedule as staffing/ortho plans permit    # Right femoral neck fracture  -per ortho - Please avoid morphine  for pain due to ESRD (toxic metabolites accumulate) - If an IV agent is needed, would recommend fentanyl  or dilaudid .  - Please avoid any continuous fluids   # HTN  - Hypertensive with pain as well as known HTN - optimize volume status with HD   - pain control per primary team  - continue home meds as tolerated per primary team    # Anemia of CKD  - Hb acceptable - no ESA indicated    # Metabolic bone disease  - resume home renvela  when taking PO - ortho to assess him  - resume hectorol   - phos at goal  Lucie Collet, PA-C 06/25/2024, 8:35 AM  [facility] Pager: [phone]   "

## 2024-06-25 NOTE — H&P (Signed)

## 2024-06-25 NOTE — ED Notes (Signed)
Phlebotomy in room. 

## 2024-06-25 NOTE — Progress Notes (Signed)
 Pt receives out-pt HD at [facility] on [facility] on MWF 11:05 am chair time. Will assist as needed.   Randine Mungo Dialysis Navigator [phone]

## 2024-06-25 NOTE — ED Notes (Signed)
 Requested phlebotomy assistance in obtaining labs after this RN was unsuccessful

## 2024-06-25 NOTE — Progress Notes (Signed)
 Attempted 2D echo 3x, pt was not available/not in room at time of attempts. Will try again later.

## 2024-06-25 NOTE — Anesthesia Procedure Notes (Signed)
 Procedure Name: Intubation Date/Time: 06/25/2024 4:05 PM  Performed by: Hedy Jarred, CRNAPre-anesthesia Checklist: Patient identified, Emergency Drugs available, Suction available and Patient being monitored Patient Re-evaluated:Patient Re-evaluated prior to induction Oxygen Delivery Method: Circle System Utilized Preoxygenation: Pre-oxygenation with 100% oxygen Induction Type: IV induction Ventilation: Mask ventilation without difficulty Laryngoscope Size: Miller and 2 Grade View: Grade I Tube type: Oral Tube size: 7.0 mm Number of attempts: 1 Airway Equipment and Method: Stylet and Oral airway Placement Confirmation: ETT inserted through vocal cords under direct vision, positive ETCO2 and breath sounds checked- equal and bilateral Secured at: 22 cm Tube secured with: Tape Dental Injury: Teeth and Oropharynx as per pre-operative assessment

## 2024-06-26 ENCOUNTER — Inpatient Hospital Stay (HOSPITAL_COMMUNITY)

## 2024-06-26 DIAGNOSIS — R55 Syncope and collapse: Secondary | ICD-10-CM | POA: Diagnosis not present

## 2024-06-26 DIAGNOSIS — E43 Unspecified severe protein-calorie malnutrition: Secondary | ICD-10-CM | POA: Insufficient documentation

## 2024-06-26 LAB — ECHOCARDIOGRAM COMPLETE
AR max vel: 1.66 cm2
AV Area VTI: 1.7 cm2
AV Area mean vel: 1.58 cm2
AV Mean grad: 9 mmHg
AV Peak grad: 16.8 mmHg
Ao pk vel: 2.05 m/s
Area-P 1/2: 4.6 cm2
Calc EF: 70.1 %
Height: 70 in
MV VTI: 1.86 cm2
S' Lateral: 2.9 cm
Single Plane A2C EF: 72.1 %
Single Plane A4C EF: 66.6 %
Weight: 2000 [oz_av]

## 2024-06-26 LAB — CBC
HCT: 32.3 % — ABNORMAL LOW (ref 39.0–52.0)
Hemoglobin: 10.7 g/dL — ABNORMAL LOW (ref 13.0–17.0)
MCH: 31.8 pg (ref 26.0–34.0)
MCHC: 33.1 g/dL (ref 30.0–36.0)
MCV: 96.1 fL (ref 80.0–100.0)
Platelets: 221 10*3/uL (ref 150–400)
RBC: 3.36 MIL/uL — ABNORMAL LOW (ref 4.22–5.81)
RDW: 16.3 % — ABNORMAL HIGH (ref 11.5–15.5)
WBC: 5.7 10*3/uL (ref 4.0–10.5)
nRBC: 0 % (ref 0.0–0.2)

## 2024-06-26 LAB — POCT I-STAT, CHEM 8
BUN: 25 mg/dL — ABNORMAL HIGH (ref 8–23)
Calcium, Ion: 1.01 mmol/L — ABNORMAL LOW (ref 1.15–1.40)
Chloride: 95 mmol/L — ABNORMAL LOW (ref 98–111)
Creatinine, Ser: 4.7 mg/dL — ABNORMAL HIGH (ref 0.61–1.24)
Glucose, Bld: 76 mg/dL (ref 70–99)
HCT: 37 % — ABNORMAL LOW (ref 39.0–52.0)
Hemoglobin: 12.6 g/dL — ABNORMAL LOW (ref 13.0–17.0)
Potassium: 4 mmol/L (ref 3.5–5.1)
Sodium: 136 mmol/L (ref 135–145)
TCO2: 30 mmol/L (ref 22–32)

## 2024-06-26 LAB — HEPATITIS B SURFACE ANTIBODY, QUANTITATIVE: Hep B S AB Quant (Post): 28.8 m[IU]/mL

## 2024-06-26 MED ORDER — NEPRO/CARBSTEADY PO LIQD
237.0000 mL | Freq: Two times a day (BID) | ORAL | Status: DC
  Administered 2024-06-27 – 2024-07-02 (×8): 237 mL via ORAL

## 2024-06-26 MED ORDER — CLOPIDOGREL BISULFATE 75 MG PO TABS
75.0000 mg | ORAL_TABLET | Freq: Every day | ORAL | Status: DC
  Administered 2024-06-26 – 2024-07-02 (×7): 75 mg via ORAL
  Filled 2024-06-26 (×7): qty 1

## 2024-06-26 MED ORDER — RENA-VITE PO TABS
1.0000 | ORAL_TABLET | Freq: Every day | ORAL | Status: DC
  Administered 2024-06-27 – 2024-07-01 (×5): 1 via ORAL
  Filled 2024-06-26 (×5): qty 1

## 2024-06-26 MED ORDER — CHLORHEXIDINE GLUCONATE CLOTH 2 % EX PADS
6.0000 | MEDICATED_PAD | Freq: Every day | CUTANEOUS | Status: AC
  Administered 2024-06-26 – 2024-06-30 (×3): 6 via TOPICAL

## 2024-06-26 MED ORDER — MUPIROCIN 2 % EX OINT
1.0000 | TOPICAL_OINTMENT | Freq: Two times a day (BID) | CUTANEOUS | Status: AC
  Administered 2024-06-26 – 2024-06-30 (×10): 1 via NASAL
  Filled 2024-06-26 (×5): qty 22

## 2024-06-26 NOTE — Progress Notes (Signed)
 " Shawn Collins Progress Note   Subjective:   Seen on HD. Denies any pain right now. Denies SOB, CP. Reports nausea.   Objective Vitals:   06/25/24 1758 06/25/24 2300 06/26/24 0400 06/26/24 0734  BP: (!) 177/70 (!) 175/71 (!) 170/72 (!) 178/77  Pulse: 63 71 73 76  Resp: 14   16  Temp: 98.7 F (37.1 C) 98.7 F (37.1 C) 98.7 F (37.1 C) 98.3 F (36.8 C)  TempSrc: Oral Oral Oral   SpO2: 96% 96% 100% 98%  Weight:      Height:       Physical Exam General: Alert male in NAD Heart: RRR, no murmur Lungs: CTA anteriorly, respirations unlabored on RA Abdomen: Soft, non-distended Extremities: no peripheral edema Dialysis Access: AVG accessed  Additional Objective Labs: Basic Metabolic Panel: Recent Labs  Lab 06/24/24 1447 06/24/24 1453 06/24/24 1454 06/25/24 0522 06/25/24 1505  NA 139 140 140 139 136  K 4.9 4.3 4.3 5.2* 4.0  CL 98 101  --  99 95*  CO2 25  --   --  26  --   GLUCOSE 139* 140*  --  143* 76  BUN 51* 49*  --  58* 25*  CREATININE 7.83* 8.90*  --  8.97* 4.70*  CALCIUM  9.7  --   --  9.7  --   PHOS  --   --   --  4.5  --    Liver Function Tests: Recent Labs  Lab 06/24/24 1447 06/25/24 0522  AST 37  --   ALT 15  --   ALKPHOS 88  --   BILITOT 0.6  --   PROT 7.5  --   ALBUMIN  4.3 4.3   Recent Labs  Lab 06/24/24 1447  LIPASE 27   CBC: Recent Labs  Lab 06/24/24 1447 06/24/24 1453 06/24/24 1454 06/25/24 1505 06/26/24 0614  WBC 7.2  --   --   --  5.7  NEUTROABS 6.1  --   --   --   --   HGB 10.6*   < > 11.9* 12.6* 10.7*  HCT 33.2*   < > 35.0* 37.0* 32.3*  MCV 98.5  --   --   --  96.1  PLT 242  --   --   --  221   < > = values in this interval not displayed.   Blood Culture    Component Value Date/Time   SDES BLOOD RIGHT ARM 05/26/2024 1130   SDES BLOOD RIGHT HAND 05/26/2024 1130   SPECREQUEST  05/26/2024 1130    BOTTLES DRAWN AEROBIC AND ANAEROBIC Blood Culture adequate volume   SPECREQUEST  05/26/2024 1130    BOTTLES DRAWN  AEROBIC ONLY Blood Culture results may not be optimal due to an inadequate volume of blood received in culture bottles   CULT  05/26/2024 1130    NO GROWTH 5 DAYS Performed at [facility], [address], [place], [place] [address]    CULT  05/26/2024 1130    NO GROWTH 5 DAYS Performed at [facility], [address], [place], [place] [address]    REPTSTATUS 05/31/2024 FINAL 05/26/2024 1130   REPTSTATUS 05/31/2024 FINAL 05/26/2024 1130    Cardiac Enzymes: No results for input(s): CKTOTAL, CKMB, CKMBINDEX, TROPONINI in the last 168 hours. CBG: Recent Labs  Lab 06/25/24 1702  GLUCAP 73   Iron Studies:  Recent Labs    06/24/24 2303  IRON 27*  TIBC 276  FERRITIN 953*   @  lablastinr3@ Studies/Results: DG HIP UNILAT W OR W/O PELVIS 2-3 VIEWS RIGHT Result Date: 06/25/2024 EXAM: 2 or 3 VIEW(S) XRAY OF THE RIGHT HIP 06/25/2024 05:22:00 PM COMPARISON: 06/24/2024 CLINICAL HISTORY: Deficient knowledge of open reduction and internal fixation of hip. FINDINGS: BONES AND JOINTS: ORIF of right femoral neck fracture with 3 partially threaded screws in place. SOFT TISSUES: Postoperative air within soft tissues. Vascular calcifications. IMPRESSION: 1. ORIF of right femoral neck fracture with 3 partially threaded screws in place. 2. Postoperative air within the soft tissues. Electronically signed by: Oneil Devonshire MD 06/25/2024 11:41 PM EST RP Workstation: [id number]   DG HIP UNILAT WITH PELVIS 2-3 VIEWS RIGHT Result Date: 06/25/2024 EXAM: FLUOROSCOPIC IMAGING TECHNIQUE: Fluoroscopy was provided by the radiology department for procedure. Radiologist was not present during examination. RADIATION DOSE INDEX: Reference Air Kerma: 14.14 mGy. Fluoroscopy time: 72.2 seconds. Images: 5 COMPARISON: 06/24/2024. CLINICAL HISTORY: Right femoral neck fracture with impaction. FINDINGS: Intraoperative fluoroscopic imaging was performed. Initial images again demonstrate the mildly impacted right  femoral neck fracture. Three fixation screws were then placed across the femoral neck. IMPRESSION: 1. Intraoperative fluoroscopic imaging as above. Please refer to the operative report for full details. Electronically signed by: Oneil Devonshire MD 06/25/2024 11:40 PM EST RP Workstation: [id number] [id number] [id number] Min-No Report Result Date: 06/25/2024 Fluoroscopy was utilized by the requesting physician.  No radiographic interpretation.   NM Pulmonary Perfusion Result Date: 06/25/2024 EXAM: NM Lung Perfusion Scan. CLINICAL HISTORY: Pulmonary embolism (PE) suspected, high probability; Syncope or presyncope, cerebrovascular cause suspected. Patient has been experiencing shortness of breath (SOB). Patient has chronic obstructive pulmonary disease (COPD). Has not been a smoker for 50+ years. TECHNIQUE: Radiolabeled MAA was administered intravenously via the right forearm at 13:55. Planar images of the lungs were obtained in multiple projections. RADIOPHARMACEUTICAL: 3.94 mCi Technetium-36m (Tc-65m) Macroaggregated Albumin  (MAA) injection solution. COMPARISON: CT chest and pelvis 06/24/2024 and chest x-ray 06/24/2024. FINDINGS: PERFUSION: No wedge-shaped peripheral perfusion defect within left or right lung to suggest acute pulmonary embolism. Normal perfusion pattern. Prominent cardiac attenuation. IMPRESSION: 1. No wedge-shaped peripheral perfusion defect within the left or right lung to suggest acute pulmonary embolism; normal perfusion pattern. Electronically signed by: Norleen Boxer MD 06/25/2024 04:02 PM EST RP Workstation: [id number]   MR BRAIN WO CONTRAST Result Date: 06/24/2024 CLINICAL DATA:  Initial evaluation for acute syncope/presyncope, stroke suspected. EXAM: MRI HEAD WITHOUT CONTRAST TECHNIQUE: Multiplanar, multiecho pulse sequences of the brain and surrounding structures were obtained without intravenous contrast. COMPARISON:  CT from earlier the same day. FINDINGS: Brain: Mild age-related cerebral  atrophy. Patchy and confluent T2/FLAIR hyperintensity involving the periventricular deep white matter, consistent with chronic small vessel ischemic disease, moderate in nature. Few small remote lacunar infarcts present about the left basal ganglia, left frontal corona radiata, and thalami. Few probable tiny remote cerebellar infarcts noted. No evidence for acute or subacute infarct. Gray-white matter differentiation maintained. No areas of chronic cortical infarction. No visible acute intracranial hemorrhage. Suspected single chronic microhemorrhage at the left cerebellum noted, of doubtful significance in isolation. No mass lesion, midline shift or mass effect. Mild ventricular prominence related global parenchymal volume loss without hydrocephalus. No extra-axial fluid collection. Pituitary gland within normal limits. Vascular: Major intracranial vascular flow voids are maintained. Skull and upper cervical spine: Craniocervical junction within normal limits. Bone marrow signal intensity overall within normal limits. No scalp soft tissue abnormality. Sinuses/Orbits: Globes and orbital soft tissues within normal limits. Mild mucosal thickening about the ethmoidal air cells. Paranasal sinuses are  otherwise largely clear. No significant mastoid effusion. Other: None. IMPRESSION: 1. No acute intracranial abnormality. 2. Age-related cerebral atrophy with moderate chronic microvascular ischemic disease, with a few scattered remote lacunar infarcts as above. Electronically Signed   By: Morene Hoard M.D.   On: 06/24/2024 19:55   CT Head Wo Contrast Result Date: 06/24/2024 CLINICAL DATA:  Head trauma EXAM: CT HEAD WITHOUT CONTRAST TECHNIQUE: Contiguous axial images were obtained from the base of the skull through the vertex without intravenous contrast. RADIATION DOSE REDUCTION: This exam was performed according to the departmental dose-optimization program which includes automated exposure control, adjustment  of the mA and/or kV according to patient size and/or use of iterative reconstruction technique. COMPARISON:  05/21/2024 FINDINGS: Brain: No acute territorial infarction, hemorrhage or intracranial mass. Atrophy and chronic small vessel ischemic changes of the white matter. 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 The ventricles are stable in size. Vascular: No hyperdense vessels. Vertebral and carotid vascular calcification Skull: Normal. Negative for fracture or focal lesion. Sinuses/Orbits: No acute finding. Other: None IMPRESSION: 1. No CT evidence for acute intracranial abnormality. 2. Atrophy and chronic small vessel ischemic changes of the white matter. Electronically Signed   By: Luke Bun M.D.   On: 06/24/2024 16:26   DG Hip Unilat W or Wo Pelvis 2-3 Views Right Result Date: 06/24/2024 CLINICAL DATA:  Fall EXAM: DG HIP (WITH OR WITHOUT PELVIS) 2-3V RIGHT COMPARISON:  CT 06/24/2024 FINDINGS: SI joints are non widened. Vascular calcifications. Pubic symphysis and rami appear intact. Acute nondisplaced right femoral neck fracture. No femoral head dislocation. IMPRESSION: Acute nondisplaced right femoral neck fracture. Electronically Signed   By: Luke Bun M.D.   On: 06/24/2024 16:23   DG Chest 1 View Result Date: 06/24/2024 CLINICAL DATA:  Shortness of breath EXAM: CHEST  1 VIEW COMPARISON:  05/24/2024 FINDINGS: Post sternotomy changes. Mild cardiomegaly. No acute airspace disease, pleural effusion or pneumothorax. IMPRESSION: No active disease. Mild cardiomegaly. Electronically Signed   By: Luke Bun M.D.   On: 06/24/2024 16:21   CT Cervical Spine Wo Contrast Result Date: 06/24/2024 CLINICAL DATA:  Neck trauma fall EXAM: CT CERVICAL SPINE WITHOUT CONTRAST TECHNIQUE: Multidetector CT imaging of the cervical spine was performed without intravenous contrast. Multiplanar CT image reconstructions were also generated. RADIATION DOSE REDUCTION: This exam was performed according  to the departmental dose-optimization program which includes automated exposure control, adjustment of the mA and/or kV according to patient size and/or use of iterative reconstruction technique. COMPARISON:  None Available. FINDINGS: Alignment: Straightening of the cervical spine. No subluxation. Facet alignment is normal Skull base and vertebrae: No acute fracture. No primary bone lesion or focal pathologic process. Soft tissues and spinal canal: No prevertebral fluid or swelling. No visible canal hematoma. Disc levels: Advanced disc space narrowing and degenerative change C2 through C7. Multilevel facet degenerative change and foraminal narrowing. Interbody ankylosis C4-C5 with fusion of the right greater than left facets at this level. No high-grade bony canal stenosis Upper chest: Negative. Other: None IMPRESSION: Straightening of the cervical spine with advanced degenerative changes. No acute osseous abnormality. Electronically Signed   By: Luke Bun M.D.   On: 06/24/2024 16:20   CT CHEST ABDOMEN PELVIS WO CONTRAST Result Date: 06/24/2024 CLINICAL DATA:  Sepsis, fall from bed, right-sided hip pain. On blood thinners. EXAM: CT CHEST, ABDOMEN AND PELVIS WITHOUT CONTRAST TECHNIQUE: Multidetector CT imaging of the chest, abdomen and pelvis was performed following the standard protocol without IV contrast. RADIATION DOSE REDUCTION:  This exam was performed according to the departmental dose-optimization program which includes automated exposure control, adjustment of the mA and/or kV according to patient size and/or use of iterative reconstruction technique. COMPARISON:  CT abdomen pelvis 05/24/2024 and CT chest 02/10/2024. FINDINGS: CT CHEST FINDINGS Cardiovascular: Atherosclerotic calcification of the aorta, aortic valve and coronary arteries. Enlarged pulmonic trunk and heart. No pericardial effusion. Mediastinum/Nodes: No pathologically enlarged mediastinal or axillary lymph nodes. Hilar regions are  difficult to definitively evaluate without IV contrast. Bilateral gynecomastia. Esophagus is grossly unremarkable. Lungs/Pleura: Image quality is degraded by expiratory phase imaging and respiratory motion. Mild dependent atelectasis bilaterally with small bilateral pleural effusions. 9 mm apical segment right upper lobe ground-glass nodule (5/20), unchanged from 02/10/2024. Airway is unremarkable. Musculoskeletal: Old right rib fractures.  No acute fracture. CT ABDOMEN PELVIS FINDINGS Hepatobiliary: Patient's arms create streak artifact in the abdomen, degrading image quality. Liver is grossly unremarkable. Tiny gallstones. No biliary ductal dilatation. Pancreas: Negative. Spleen: Negative. Adrenals/Urinary Tract: Adrenal glands are unremarkable. Renal vascular calcifications and/or stones in the kidneys bilaterally. Kidneys are otherwise unremarkable. Ureters are decompressed. Bladder is grossly unremarkable. Stomach/Bowel: Stomach, small bowel, appendix and colon are grossly unremarkable. Moderate stool burden. Vascular/Lymphatic: Atherosclerotic calcification of the aorta. No pathologically enlarged lymph nodes. Reproductive: Prostate is mildly enlarged. Other: Mild presacral edema. No free fluid. Small umbilical hernia contains fat. Musculoskeletal: Osteopenia. Sarcopenia. Degenerative changes in the spine. There is a nondisplaced right femoral neck fracture (coronal image 44 and sagittal image 37). No additional evidence of an acute fracture. IMPRESSION: 1. Nondisplaced right femoral neck fracture. 2. No additional evidence of acute trauma in the chest, abdomen or pelvis. 3. Small bilateral pleural effusions with dependent atelectasis in both lower lobes. Difficult to exclude aspiration. 4. 9 mm apical segment right upper lobe ground-glass nodule, unchanged from 02/10/2024. This can be reassessed in 2 years, as clinically indicated. This recommendation follows the consensus statement: Guidelines for Management  of Small Pulmonary Nodules Detected on CT Images: From the Fleischner Society 2017; Radiology 2017; 284:228-243. 5. Cholelithiasis. 6. Renal vascular calcifications and/or stones. 7. Enlarged prostate. 8. Aortic atherosclerosis (ICD10-I70.0). Coronary artery calcification. 9. Enlarged pulmonic trunk, indicative of pulmonary arterial hypertension. Electronically Signed   By: Newell Eke M.D.   On: 06/24/2024 16:08   Medications:  sodium chloride  75 mL/hr at 06/25/24 1820    acetaminophen   500 mg Oral Q6H   Chlorhexidine  Gluconate Cloth  6 each Topical Q0600   Chlorhexidine  Gluconate Cloth  6 each Topical Daily   clopidogrel   75 mg Oral Daily   docusate sodium   100 mg Oral BID   doxercalciferol   1 mcg Intravenous Q M,W,F-HD   irbesartan   300 mg Oral Daily   mupirocin  ointment  1 Application Nasal BID   pantoprazole  (PROTONIX ) IV  40 mg Intravenous Q12H   sevelamer  carbonate  1,600 mg Oral TID WC   And   sevelamer  carbonate  800 mg Oral With snacks    Dialysis Orders: [facility] MWF  2k/2.5 ca BF 450 ml/min DF 800 ml/min EDW 57.6 kg  Profile 2 3 hrs and 45 minutes Access:  AV graft Meds: mircera 50 mcg every 2 weeks (last given 06/12/24); venofer 50 mg weekly Hectorol  1 mcg three times a week  Assessment/Plan: # ESRD  - HD usually MWF schedule, rx HD Tues, appears comfortable. Bec of logistics/ census he would not be done today till overnight. Rather than do that I will place him 1st shift for [facility] and  adjust schedule accordingly. - off schedule today due to ED visit - Will  transition to MWF schedule as staffing/ortho plans permit; thur will be next HD   # Right femoral neck fracture  -per ortho - Please avoid morphine  for pain due to ESRD (toxic metabolites accumulate) - If an IV agent is needed, would recommend fentanyl  or dilaudid .  - Please avoid any continuous fluids   # HTN  - Hypertensive with pain as well as known HTN - optimize volume status with HD   -  pain control per primary team  - continue home meds as tolerated per primary team    # Anemia of CKD  - Hb acceptable - no ESA indicated    # Metabolic bone disease  - resume home renvela  when taking PO - ortho to assess him  - resume hectorol   - phos at goal    "

## 2024-06-26 NOTE — Progress Notes (Addendum)
 Subjective: 1 Day Post-Op Procedures (LRB): FIXATION, FEMUR, NECK, PERCUTANEOUS, USING SCREW (Right) Patient reports pain as mild.    Objective: Vital signs in last 24 hours: Temp:  [98 F (36.7 C)-99 F (37.2 C)] 98.3 F (36.8 C) (01/28 0734) Pulse Rate:  [63-81] 76 (01/28 0734) Resp:  [10-27] 16 (01/28 0734) BP: (170-223)/(64-88) 178/77 (01/28 0734) SpO2:  [91 %-100 %] 98 % (01/28 0734) Weight:  [56.7 kg] 56.7 kg (01/27 1510)  Intake/Output from previous day: 01/27 0701 - 01/28 0700 In: 217.6 [I.V.:117.6; IV Piggyback:100] Out: 75.9 [Blood:5] Intake/Output this shift: No intake/output data recorded.  Recent Labs    06/24/24 1447 06/24/24 1453 06/24/24 1454 06/26/24 0614  HGB 10.6* 11.6* 11.9* 10.7*   Recent Labs    06/24/24 1447 06/24/24 1453 06/24/24 1454 06/24/24 2303 06/26/24 0614  WBC 7.2  --   --   --  5.7  RBC 3.37*  --   --  3.39* 3.36*  HCT 33.2*   < > 35.0*  --  32.3*  PLT 242  --   --   --  221   < > = values in this interval not displayed.   Recent Labs    06/24/24 1447 06/24/24 1453 06/24/24 1454 06/25/24 0522  NA 139 140 140 139  K 4.9 4.3 4.3 5.2*  CL 98 101  --  99  CO2 25  --   --  26  BUN 51* 49*  --  58*  CREATININE 7.83* 8.90*  --  8.97*  GLUCOSE 139* 140*  --  143*  CALCIUM  9.7  --   --  9.7   Recent Labs    06/24/24 1503  INR 1.3*    Neurovascular intact Sensation intact distally Intact pulses distally Dorsiflexion/Plantar flexion intact Incision: dressing C/D/I No cellulitis present Compartment soft   Assessment/Plan: 1 Day Post-Op Procedures (LRB): FIXATION, FEMUR, NECK, PERCUTANEOUS, USING SCREW (Right) Up with therapy 25% WB RLE ABLA- mild and stable May restart plavix  today [facility] rx on chart F/u with ortho two weeks post-op       Ronal LITTIE Grave 06/26/2024, 8:10 AM

## 2024-06-26 NOTE — Progress Notes (Signed)
 " PROGRESS NOTE    Shawn Collins  FMW:[id number] DOB: 08-01-1950 DOA: 06/24/2024 PCP: Shelda Atlas, MD  Subjective: Patient was seen in the orthopedic floor.  He was in a recliner..  Pain control has been adequate per his report.SABRA  His breathing was comfortable.  He had no other complaints.    Hospital Course: No notes on file   Assessment and Plan:  Right hip fracture      -- He is postop day 1.  Pain is controlled.  2.   End-stage renal disease      -Electrolytes and volume status are adequate.  Ionized calcium  slightly low.  No bleeding.  3.   Question of pulmonary embolism as a cause of syncope       - Nuclear medicine perfusion scan was low probability.  SpO2 98% on 2 L     DVT prophylaxis: SCDs Start: 06/25/24 1757 Place TED hose Start: 06/25/24 1757 Place and maintain sequential compression device Start: 06/24/24 1843 SCDs Start: 06/24/24 1832     Code Status: Do not attempt resuscitation (DNR) PRE-ARREST INTERVENTIONS DESIRED Family Communication: None available Disposition Plan: Hopefully will be able to return to [facility].SABRA Reason for continuing need for hospitalization: Postoperative monitoring/need for hemodialysis in an inpatient setting due to fresh hip fracture repair.  Objective: Vitals:   06/25/24 1758 06/25/24 2300 06/26/24 0400 06/26/24 0734  BP: (!) 177/70 (!) 175/71 (!) 170/72 (!) 178/77  Pulse: 63 71 73 76  Resp: 14   16  Temp: 98.7 F (37.1 C) 98.7 F (37.1 C) 98.7 F (37.1 C) 98.3 F (36.8 C)  TempSrc: Oral Oral Oral   SpO2: 96% 96% 100% 98%  Weight:      Height:        Intake/Output Summary (Last 24 hours) at 06/26/2024 1031 Last data filed at 06/25/2024 1845 Gross per 24 hour  Intake 217.62 ml  Output 75.9 ml  Net 141.72 ml   Filed Weights   06/25/24 0243 06/25/24 0800 06/25/24 1510  Weight: 58.9 kg 58.9 kg 56.7 kg    Examination:  Chronically ill male on the orthopedic floor in his room, in the recliner..  Currently  looks comfortable.  Work of breathing is normal HEENT: PERRLA  EOMI no scleral icterus CV: Regular rate and rhythm S1-S2 no murmurs rubs gallops Lungs: Clear Abdomen: Soft, bowel sounds present extremities: Foreshortening and external rotation of the right lower extremity.  Extremities: Both lower extremities without edema and neurovascularly intact, dressing over right lateral hip is clean dry and intact Neurologic: He is awake alert coherent can move his upper extremities and his left lower extremity.  Right lower extremity movement is slightly impaired due to surgery   Data Reviewed: I have personally reviewed following labs and imaging studies  CBC: Recent Labs  Lab 06/24/24 1447 06/24/24 1453 06/24/24 1454 06/25/24 1505 06/26/24 0614  WBC 7.2  --   --   --  5.7  NEUTROABS 6.1  --   --   --   --   HGB 10.6* 11.6* 11.9* 12.6* 10.7*  HCT 33.2* 34.0* 35.0* 37.0* 32.3*  MCV 98.5  --   --   --  96.1  PLT 242  --   --   --  221   Basic Metabolic Panel: Recent Labs  Lab 06/24/24 1447 06/24/24 1453 06/24/24 1454 06/25/24 0522 06/25/24 1505  NA 139 140 140 139 136  K 4.9 4.3 4.3 5.2* 4.0  CL 98 101  --  99 95*  CO2 25  --   --  26  --   GLUCOSE 139* 140*  --  143* 76  BUN 51* 49*  --  58* 25*  CREATININE 7.83* 8.90*  --  8.97* 4.70*  CALCIUM  9.7  --   --  9.7  --   PHOS  --   --   --  4.5  --    GFR: Estimated Creatinine Clearance: 10.9 mL/min (A) (by C-G formula based on SCr of 4.7 mg/dL (H)). Liver Function Tests: Recent Labs  Lab 06/24/24 1447 06/25/24 0522  AST 37  --   ALT 15  --   ALKPHOS 88  --   BILITOT 0.6  --   PROT 7.5  --   ALBUMIN  4.3 4.3   Recent Labs  Lab 06/24/24 1447  LIPASE 27   No results for input(s): AMMONIA in the last 168 hours. Coagulation Profile: Recent Labs  Lab 06/24/24 1503  INR 1.3*   Cardiac Enzymes: No results for input(s): CKTOTAL, CKMB, CKMBINDEX, TROPONINI in the last 168 hours. ProBNP, BNP (last 5  results) Recent Labs    07/10/23 1302 06/24/24 2303  PROBNP  --  17,514.0*  BNP >4,500.0*  --    HbA1C: No results for input(s): HGBA1C in the last 72 hours. CBG: Recent Labs  Lab 06/25/24 1702  GLUCAP 73   Lipid Profile: No results for input(s): CHOL, HDL, LDLCALC, TRIG, CHOLHDL, LDLDIRECT in the last 72 hours. Thyroid  Function Tests: Recent Labs    06/24/24 2303  TSH 3.490  FREET4 1.29   Anemia Panel: Recent Labs    06/24/24 2303  VITAMINB12 778  FOLATE 10.7  FERRITIN 953*  TIBC 276  IRON 27*  RETICCTPCT 1.6   Sepsis Labs: Recent Labs  Lab 06/24/24 1454 06/24/24 2316  LATICACIDVEN 0.8 1.0    Recent Results (from the past 240 hours)  Surgical pcr screen     Status: Abnormal   Collection Time: 06/25/24  3:10 PM   Specimen: Nasal Mucosa; Nasal Swab  Result Value Ref Range Status   MRSA, PCR POSITIVE (A) NEGATIVE Final    Comment: RESULT CALLED TO, READ BACK BY AND VERIFIED WITH: RN Bernarda DASEN on [phone] @1825  by SM    Staphylococcus aureus POSITIVE (A) NEGATIVE Final    Comment: (NOTE) The Xpert SA Assay (FDA approved for NASAL specimens in patients 19 years of age and older), is one component of a comprehensive surveillance program. It is not intended to diagnose infection nor to guide or monitor treatment. Performed at [facility], [address] [place], [place] [address]      Radiology Studies: DG HIP UNILAT W OR W/O PELVIS 2-3 VIEWS RIGHT Result Date: 06/25/2024 EXAM: 2 or 3 VIEW(S) XRAY OF THE RIGHT HIP 06/25/2024 05:22:00 PM COMPARISON: 06/24/2024 CLINICAL HISTORY: Deficient knowledge of open reduction and internal fixation of hip. FINDINGS: BONES AND JOINTS: ORIF of right femoral neck fracture with 3 partially threaded screws in place. SOFT TISSUES: Postoperative air within soft tissues. Vascular calcifications. IMPRESSION: 1. ORIF of right femoral neck fracture with 3 partially threaded screws in place. 2. Postoperative air  within the soft tissues. Electronically signed by: Oneil Devonshire MD 06/25/2024 11:41 PM EST RP Workstation: [id number]   DG HIP UNILAT WITH PELVIS 2-3 VIEWS RIGHT Result Date: 06/25/2024 EXAM: FLUOROSCOPIC IMAGING TECHNIQUE: Fluoroscopy was provided by the radiology department for procedure. Radiologist was not present during examination. RADIATION DOSE INDEX: Reference Air Kerma: 14.14 mGy. Fluoroscopy  time: 72.2 seconds. Images: 5 COMPARISON: 06/24/2024. CLINICAL HISTORY: Right femoral neck fracture with impaction. FINDINGS: Intraoperative fluoroscopic imaging was performed. Initial images again demonstrate the mildly impacted right femoral neck fracture. Three fixation screws were then placed across the femoral neck. IMPRESSION: 1. Intraoperative fluoroscopic imaging as above. Please refer to the operative report for full details. Electronically signed by: Oneil Devonshire MD 06/25/2024 11:40 PM EST RP Workstation: [id number] [id number] [id number] Min-No Report Result Date: 06/25/2024 Fluoroscopy was utilized by the requesting physician.  No radiographic interpretation.   NM Pulmonary Perfusion Result Date: 06/25/2024 EXAM: NM Lung Perfusion Scan. CLINICAL HISTORY: Pulmonary embolism (PE) suspected, high probability; Syncope or presyncope, cerebrovascular cause suspected. Patient has been experiencing shortness of breath (SOB). Patient has chronic obstructive pulmonary disease (COPD). Has not been a smoker for 50+ years. TECHNIQUE: Radiolabeled MAA was administered intravenously via the right forearm at 13:55. Planar images of the lungs were obtained in multiple projections. RADIOPHARMACEUTICAL: 3.94 mCi Technetium-89m (Tc-50m) Macroaggregated Albumin  (MAA) injection solution. COMPARISON: CT chest and pelvis 06/24/2024 and chest x-ray 06/24/2024. FINDINGS: PERFUSION: No wedge-shaped peripheral perfusion defect within left or right lung to suggest acute pulmonary embolism. Normal perfusion pattern. Prominent cardiac  attenuation. IMPRESSION: 1. No wedge-shaped peripheral perfusion defect within the left or right lung to suggest acute pulmonary embolism; normal perfusion pattern. Electronically signed by: Norleen Boxer MD 06/25/2024 04:02 PM EST RP Workstation: [id number]   MR BRAIN WO CONTRAST Result Date: 06/24/2024 CLINICAL DATA:  Initial evaluation for acute syncope/presyncope, stroke suspected. EXAM: MRI HEAD WITHOUT CONTRAST TECHNIQUE: Multiplanar, multiecho pulse sequences of the brain and surrounding structures were obtained without intravenous contrast. COMPARISON:  CT from earlier the same day. FINDINGS: Brain: Mild age-related cerebral atrophy. Patchy and confluent T2/FLAIR hyperintensity involving the periventricular deep white matter, consistent with chronic small vessel ischemic disease, moderate in nature. Few small remote lacunar infarcts present about the left basal ganglia, left frontal corona radiata, and thalami. Few probable tiny remote cerebellar infarcts noted. No evidence for acute or subacute infarct. Gray-white matter differentiation maintained. No areas of chronic cortical infarction. No visible acute intracranial hemorrhage. Suspected single chronic microhemorrhage at the left cerebellum noted, of doubtful significance in isolation. No mass lesion, midline shift or mass effect. Mild ventricular prominence related global parenchymal volume loss without hydrocephalus. No extra-axial fluid collection. Pituitary gland within normal limits. Vascular: Major intracranial vascular flow voids are maintained. Skull and upper cervical spine: Craniocervical junction within normal limits. Bone marrow signal intensity overall within normal limits. No scalp soft tissue abnormality. Sinuses/Orbits: Globes and orbital soft tissues within normal limits. Mild mucosal thickening about the ethmoidal air cells. Paranasal sinuses are otherwise largely clear. No significant mastoid effusion. Other: None. IMPRESSION: 1. No  acute intracranial abnormality. 2. Age-related cerebral atrophy with moderate chronic microvascular ischemic disease, with a few scattered remote lacunar infarcts as above. Electronically Signed   By: Morene Hoard M.D.   On: 06/24/2024 19:55   CT Head Wo Contrast Result Date: 06/24/2024 CLINICAL DATA:  Head trauma EXAM: CT HEAD WITHOUT CONTRAST TECHNIQUE: Contiguous axial images were obtained from the base of the skull through the vertex without intravenous contrast. RADIATION DOSE REDUCTION: This exam was performed according to the departmental dose-optimization program which includes automated exposure control, adjustment of the mA and/or kV according to patient size and/or use of iterative reconstruction technique. COMPARISON:  05/21/2024 FINDINGS: Brain: No acute territorial infarction, hemorrhage or intracranial mass. Atrophy and chronic small vessel ischemic changes of the white matter. Small  chronic infarct left basal ganglia and white matter. The ventricles are stable in size. Vascular: No hyperdense vessels. Vertebral and carotid vascular calcification Skull: Normal. Negative for fracture or focal lesion. Sinuses/Orbits: No acute finding. Other: None IMPRESSION: 1. No CT evidence for acute intracranial abnormality. 2. Atrophy and chronic small vessel ischemic changes of the white matter. Electronically Signed   By: Luke Bun M.D.   On: 06/24/2024 16:26   DG Hip Unilat W or Wo Pelvis 2-3 Views Right Result Date: 06/24/2024 CLINICAL DATA:  Fall EXAM: DG HIP (WITH OR WITHOUT PELVIS) 2-3V RIGHT COMPARISON:  CT 06/24/2024 FINDINGS: SI joints are non widened. Vascular calcifications. Pubic symphysis and rami appear intact. Acute nondisplaced right femoral neck fracture. No femoral head dislocation. IMPRESSION: Acute nondisplaced right femoral neck fracture. Electronically Signed   By: Luke Bun M.D.   On: 06/24/2024 16:23   DG Chest 1 View Result Date: 06/24/2024 CLINICAL DATA:  Shortness  of breath EXAM: CHEST  1 VIEW COMPARISON:  05/24/2024 FINDINGS: Post sternotomy changes. Mild cardiomegaly. No acute airspace disease, pleural effusion or pneumothorax. IMPRESSION: No active disease. Mild cardiomegaly. Electronically Signed   By: Luke Bun M.D.   On: 06/24/2024 16:21   CT Cervical Spine Wo Contrast Result Date: 06/24/2024 CLINICAL DATA:  Neck trauma fall EXAM: CT CERVICAL SPINE WITHOUT CONTRAST TECHNIQUE: Multidetector CT imaging of the cervical spine was performed without intravenous contrast. Multiplanar CT image reconstructions were also generated. RADIATION DOSE REDUCTION: This exam was performed according to the departmental dose-optimization program which includes automated exposure control, adjustment of the mA and/or kV according to patient size and/or use of iterative reconstruction technique. COMPARISON:  None Available. FINDINGS: Alignment: Straightening of the cervical spine. No subluxation. Facet alignment is normal Skull base and vertebrae: No acute fracture. No primary bone lesion or focal pathologic process. Soft tissues and spinal canal: No prevertebral fluid or swelling. No visible canal hematoma. Disc levels: Advanced disc space narrowing and degenerative change C2 through C7. Multilevel facet degenerative change and foraminal narrowing. Interbody ankylosis C4-C5 with fusion of the right greater than left facets at this level. No high-grade bony canal stenosis Upper chest: Negative. Other: None IMPRESSION: Straightening of the cervical spine with advanced degenerative changes. No acute osseous abnormality. Electronically Signed   By: Luke Bun M.D.   On: 06/24/2024 16:20   CT CHEST ABDOMEN PELVIS WO CONTRAST Result Date: 06/24/2024 CLINICAL DATA:  Sepsis, fall from bed, right-sided hip pain. On blood thinners. EXAM: CT CHEST, ABDOMEN AND PELVIS WITHOUT CONTRAST TECHNIQUE: Multidetector CT imaging of the chest, abdomen and pelvis was performed following the standard  protocol without IV contrast. RADIATION DOSE REDUCTION: This exam was performed according to the departmental dose-optimization program which includes automated exposure control, adjustment of the mA and/or kV according to patient size and/or use of iterative reconstruction technique. COMPARISON:  CT abdomen pelvis 05/24/2024 and CT chest 02/10/2024. FINDINGS: CT CHEST FINDINGS Cardiovascular: Atherosclerotic calcification of the aorta, aortic valve and coronary arteries. Enlarged pulmonic trunk and heart. No pericardial effusion. Mediastinum/Nodes: No pathologically enlarged mediastinal or axillary lymph nodes. Hilar regions are difficult to definitively evaluate without IV contrast. Bilateral gynecomastia. Esophagus is grossly unremarkable. Lungs/Pleura: Image quality is degraded by expiratory phase imaging and respiratory motion. Mild dependent atelectasis bilaterally with small bilateral pleural effusions. 9 mm apical segment right upper lobe ground-glass nodule (5/20), unchanged from 02/10/2024. Airway is unremarkable. Musculoskeletal: Old right rib fractures.  No acute fracture. CT ABDOMEN PELVIS FINDINGS Hepatobiliary: Patient's arms create streak  artifact in the abdomen, degrading image quality. Liver is grossly unremarkable. Tiny gallstones. No biliary ductal dilatation. Pancreas: Negative. Spleen: Negative. Adrenals/Urinary Tract: Adrenal glands are unremarkable. Renal vascular calcifications and/or stones in the kidneys bilaterally. Kidneys are otherwise unremarkable. Ureters are decompressed. Bladder is grossly unremarkable. Stomach/Bowel: Stomach, small bowel, appendix and colon are grossly unremarkable. Moderate stool burden. Vascular/Lymphatic: Atherosclerotic calcification of the aorta. No pathologically enlarged lymph nodes. Reproductive: Prostate is mildly enlarged. Other: Mild presacral edema. No free fluid. Small umbilical hernia contains fat. Musculoskeletal: Osteopenia. Sarcopenia. Degenerative  changes in the spine. There is a nondisplaced right femoral neck fracture (coronal image 44 and sagittal image 37). No additional evidence of an acute fracture. IMPRESSION: 1. Nondisplaced right femoral neck fracture. 2. No additional evidence of acute trauma in the chest, abdomen or pelvis. 3. Small bilateral pleural effusions with dependent atelectasis in both lower lobes. Difficult to exclude aspiration. 4. 9 mm apical segment right upper lobe ground-glass nodule, unchanged from 02/10/2024. This can be reassessed in 2 years, as clinically indicated. This recommendation follows the consensus statement: Guidelines for Management of Small Pulmonary Nodules Detected on CT Images: From the Fleischner Society 2017; Radiology 2017; 284:228-243. 5. Cholelithiasis. 6. Renal vascular calcifications and/or stones. 7. Enlarged prostate. 8. Aortic atherosclerosis (ICD10-I70.0). Coronary artery calcification. 9. Enlarged pulmonic trunk, indicative of pulmonary arterial hypertension. Electronically Signed   By: Newell Eke M.D.   On: 06/24/2024 16:08    Scheduled Meds:  acetaminophen   500 mg Oral Q6H   Chlorhexidine  Gluconate Cloth  6 each Topical Q0600   Chlorhexidine  Gluconate Cloth  6 each Topical Daily   clopidogrel   75 mg Oral Daily   docusate sodium   100 mg Oral BID   doxercalciferol   1 mcg Intravenous Q M,W,F-HD   irbesartan   300 mg Oral Daily   mupirocin  ointment  1 Application Nasal BID   pantoprazole  (PROTONIX ) IV  40 mg Intravenous Q12H   sevelamer  carbonate  1,600 mg Oral TID WC   And   sevelamer  carbonate  800 mg Oral With snacks   Continuous Infusions:  sodium chloride  75 mL/hr at 06/25/24 1820     LOS: 2 days   Time spent: 30 minutes  Lonni KANDICE Moose, MD  [facility]  06/26/2024, 10:31 AM   "

## 2024-06-26 NOTE — Evaluation (Signed)
 Physical Therapy Evaluation Patient Details Name: Shawn Collins MRN: [id number] DOB: 04/01/1951 Today's Date: 06/26/2024  History of Present Illness  Pt is a 74 year old man admitted on 06/25/24 after a fall at [facility] resulting in R femoral neck fx. Underwent ORIF 06/25/24. PMH: recent admisstion with weakness and hypoglycemia, discharge to SNF, ESRD, CAD, COPD, CVA, DM2, HLD, HTN, low vision.  Clinical Impression     Pt reports walking with a RW at [facility] and being able to do much of his ADLs independently. Pt presents with impaired cognition, generalized weakness and poor standing balance. Educated in TDWB on R LE, pt will need more instruction to generalize during mobility and ADLs. He needs min assist for ambulation and close monitor for weight bearing status;  SpO2 noted to drop to 85% on RA after ambulation, rebounded to 96% with replacement of 2L O2. Patient will benefit from continued inpatient follow up therapy, <3 hours/day.     If plan is discharge home, recommend the following: Supervision due to cognitive status;A lot of help with walking and/or transfers;A lot of help with bathing/dressing/bathroom   Can travel by private vehicle   No    Equipment Recommendations Rolling walker (2 wheels);BSC/3in1  Recommendations for Other Services       Functional Status Assessment Patient has had a recent decline in their functional status and demonstrates the ability to make significant improvements in function in a reasonable and predictable amount of time.     Precautions / Restrictions Precautions Precautions: Fall Recall of Precautions/Restrictions: Impaired Restrictions Weight Bearing Restrictions Per Provider Order: Yes RLE Weight Bearing Per Provider Order: Touchdown weight bearing      Mobility  Bed Mobility Overal bed mobility: Needs Assistance Bed Mobility: Supine to Sit     Supine to sit: Mod assist     General bed mobility comments: decreased ability  to sequence with multimodal cues, assist to support R LE and raise trunk    Transfers Overall transfer level: Needs assistance Equipment used: Rolling walker (2 wheels) Transfers: Sit to/from Stand Sit to Stand: +2 physical assistance, Min assist           General transfer comment: cues for hand placement, assist to monitor for TDWB on R foot and to rise and steady    Ambulation/Gait Ambulation/Gait assistance: +2 safety/equipment, Min assist, Mod assist Gait Distance (Feet): 25 Feet Assistive device: Rolling walker (2 wheels) Gait Pattern/deviations: Step-to pattern       General Gait Details: Noting difficulty keeping weight off of RLE in stance  Stairs            Wheelchair Mobility     Tilt Bed    Modified Rankin (Stroke Patients Only)       Balance Overall balance assessment: Needs assistance   Sitting balance-Leahy Scale: Good       Standing balance-Leahy Scale: Poor                               Pertinent Vitals/Pain Pain Assessment Pain Assessment: Faces Faces Pain Scale: Hurts a little bit Pain Location: R hip Pain Descriptors / Indicators: Discomfort, Operative site guarding Pain Intervention(s): Monitored during session    Home Living Family/patient expects to be discharged to:: Skilled nursing facility                        Prior Function Prior Level of Function : Needs assist  Mobility Comments: reports he was walking with RW at SNF ADLs Comments: per chart, pt was having more difficulty caring for himself and getting to HD prior to admission in December     Extremity/Trunk Assessment   Upper Extremity Assessment Upper Extremity Assessment: Defer to OT evaluation    Lower Extremity Assessment Lower Extremity Assessment: RLE deficits/detail RLE Deficits / Details: Some soreness with movement, but able to perform straight leg raise quite smoothly; noting difficulty keeping 25% PWB        Communication   Communication Communication: No apparent difficulties    Cognition Arousal: Alert Behavior During Therapy: WFL for tasks assessed/performed   PT - Cognitive impairments: Awareness, Sequencing, No family/caregiver present to determine baseline                         Following commands: Impaired Following commands impaired: Follows one step commands inconsistently, Follows one step commands with increased time     Cueing Cueing Techniques: Verbal cues, Visual cues, Gestural cues     General Comments General comments (skin integrity, edema, etc.): Portion of session condicuted on room air; O2 sats decr to 85% observed lowest; re-started supplemental O2    Exercises     Assessment/Plan    PT Assessment Patient needs continued PT services  PT Problem List Decreased strength;Decreased activity tolerance;Decreased balance;Decreased mobility;Decreased coordination;Decreased cognition;Decreased knowledge of use of DME;Decreased safety awareness;Decreased knowledge of precautions;Pain       PT Treatment Interventions DME instruction;Gait training;Functional mobility training;Therapeutic activities;Therapeutic exercise;Balance training;Neuromuscular re-education;Cognitive remediation;Patient/family education;Wheelchair mobility training;Manual techniques;Modalities    PT Goals (Current goals can be found in the Care Plan section)  Acute Rehab PT Goals Patient Stated Goal: To get better PT Goal Formulation: With patient Time For Goal Achievement: 07/10/24 Potential to Achieve Goals: Good    Frequency Min 3X/week     Co-evaluation   Reason for Co-Treatment: For patient/therapist safety;Necessary to address cognition/behavior during functional activity           AM-PAC PT 6 Clicks Mobility  Outcome Measure Help needed turning from your back to your side while in a flat bed without using bedrails?: A Lot Help needed moving from lying on your back  to sitting on the side of a flat bed without using bedrails?: A Lot Help needed moving to and from a bed to a chair (including a wheelchair)?: A Lot Help needed standing up from a chair using your arms (e.g., wheelchair or bedside chair)?: A Lot Help needed to walk in hospital room?: A Lot Help needed climbing 3-5 steps with a railing? : A Lot 6 Click Score: 12    End of Session Equipment Utilized During Treatment: Gait belt Activity Tolerance: Patient tolerated treatment well Patient left: in chair;with call bell/phone within reach;with chair alarm set Nurse Communication: Mobility status PT Visit Diagnosis: Unsteadiness on feet (R26.81);Other abnormalities of gait and mobility (R26.89);Difficulty in walking, not elsewhere classified (R26.2)    Time: 9085-9061 PT Time Calculation (min) (ACUTE ONLY): 24 min   Charges:   PT Evaluation $PT Eval Moderate Complexity: 1 Mod   PT General Charges $$ ACUTE PT VISIT: 1 Visit         Silvano Currier, PT  Acute Rehabilitation Services Office [phone] Secure Chat welcomed   Silvano VEAR Currier 06/26/2024, 4:17 PM

## 2024-06-26 NOTE — Progress Notes (Signed)
 Transition of Care [facility]) - CAGE-AID Screening   Patient Details  Name: Shawn Collins MRN: [id number] Date of Birth: February 09, 1951  [facility] of Care [facility]) CM/SW Contact:    Bernardino Mayotte, RN Phone Number: 06/26/2024, 6:39 AM   Clinical Narrative:  Patient denies the use of alcohol and illicit substances. Resources not given at this time.  CAGE-AID Screening:    Have You Ever Felt You Ought to Cut Down on Your Drinking or Drug Use?: No Have People Annoyed You By Critizing Your Drinking Or Drug Use?: No Have You Felt Bad Or Guilty About Your Drinking Or Drug Use?: No Have You Ever Had a Drink or Used Drugs First Thing In The Morning to Steady Your Nerves or to Get Rid of a Hangover?: No CAGE-AID Score: 0  Substance Abuse Education Offered: No

## 2024-06-26 NOTE — Anesthesia Postprocedure Evaluation (Signed)
"   Anesthesia Post Note  Patient: Shawn Collins  Procedure(s) Performed: FIXATION, FEMUR, NECK, PERCUTANEOUS, USING SCREW (Right: Hip)     Patient location during evaluation: PACU Anesthesia Type: General Level of consciousness: awake and alert Pain management: pain level controlled Vital Signs Assessment: post-procedure vital signs reviewed and stable Respiratory status: spontaneous breathing, nonlabored ventilation, respiratory function stable and patient connected to nasal cannula oxygen Cardiovascular status: blood pressure returned to baseline and stable Postop Assessment: no apparent nausea or vomiting Anesthetic complications: no   No notable events documented.           Lynwood MARLA Cornea      "

## 2024-06-26 NOTE — Progress Notes (Signed)
 PT Note  SATURATION QUALIFICATIONS: (This note is used to comply with regulatory documentation for home oxygen)   Patient Saturations on Room Air while Ambulating = 85 %  Patient Saturations on 2 Liters of oxygen at rest after Ambulating = 96%  Please briefly explain why patient needs home oxygen: Patient requires supplemental oxygen to maintain oxygen saturations at acceptable, safe levels with physical activity.  Silvano Currier, PT  Acute Rehabilitation Services Office [phone] Secure Chat welcomed

## 2024-06-26 NOTE — Evaluation (Addendum)
 Occupational Therapy Evaluation Patient Details Name: Shawn Collins MRN: [id number] DOB: 17-Sep-1950 Today's Date: 06/26/2024   History of Present Illness   Pt is a 74 year old man admitted on 06/25/24 after a fall at [facility] resulting in R femoral neck fx. Underwent ORIF 06/25/24. PMH: recent admisstion with weakness and hypoglycemia, discharge to SNF, ESRD, CAD, COPD, CVA, DM2, HLD, HTN, low vision.     Clinical Impressions Pt reports walking with a RW at [facility] and being able to do much of his ADLs independently. Pt presents with impaired cognition, generalized weakness and poor standing balance. Educated in TDWB on R LE, pt will need more instruction to generalize during mobility and ADLs. He needs min assist for ambulation and set up to max assist for ADLs. SpO2 noted to drop to 85% on RA after ambulation, rebounded to 96% with replacement of 2L O2. Patient will benefit from continued inpatient follow up therapy, <3 hours/day.     If plan is discharge home, recommend the following:   A little help with walking and/or transfers;A lot of help with bathing/dressing/bathroom;Assistance with cooking/housework;Direct supervision/assist for medications management;Direct supervision/assist for financial management;Assist for transportation;Help with stairs or ramp for entrance     Functional Status Assessment   Patient has had a recent decline in their functional status and demonstrates the ability to make significant improvements in function in a reasonable and predictable amount of time.     Equipment Recommendations   Other (comment) (defer)     Recommendations for Other Services         Precautions/Restrictions   Precautions Precautions: Fall Recall of Precautions/Restrictions: Impaired Restrictions Weight Bearing Restrictions Per Provider Order: Yes RLE Weight Bearing Per Provider Order: Touchdown weight bearing     Mobility Bed Mobility Overal bed  mobility: Needs Assistance Bed Mobility: Supine to Sit     Supine to sit: Mod assist     General bed mobility comments: decreased ability to sequence with multimodal cues, assist to support R LE and raise trunk    Transfers Overall transfer level: Needs assistance Equipment used: Rolling walker (2 wheels) Transfers: Sit to/from Stand Sit to Stand: +2 physical assistance, Min assist           General transfer comment: cues for hand placement, assist to monitor for TDWB on R foot and to rise and steady      Balance Overall balance assessment: Needs assistance   Sitting balance-Leahy Scale: Good       Standing balance-Leahy Scale: Poor                             ADL either performed or assessed with clinical judgement   ADL Overall ADL's : Needs assistance/impaired Eating/Feeding: Independent;Bed level   Grooming: Set up;Sitting   Upper Body Bathing: Supervision/ safety;Sitting   Lower Body Bathing: Sitting/lateral leans;Maximal assistance   Upper Body Dressing : Minimal assistance;Sitting   Lower Body Dressing: Maximal assistance;Bed level   Toilet Transfer: Minimal assistance;Ambulation;Rolling walker (2 wheels)           Functional mobility during ADLs: Minimal assistance;Rolling walker (2 wheels)       Vision Ability to See in Adequate Light: 1 Impaired Patient Visual Report: No change from baseline Additional Comments: low vision, R >L     Perception         Praxis Praxis: Impaired Praxis Impairment Details: Ideomotor     Pertinent Vitals/Pain Pain Assessment Pain Assessment:  Faces Faces Pain Scale: Hurts a little bit Pain Location: R hip Pain Descriptors / Indicators: Discomfort, Operative site guarding Pain Intervention(s): Monitored during session, Premedicated before session, Repositioned     Extremity/Trunk Assessment Upper Extremity Assessment Upper Extremity Assessment: Overall WFL for tasks assessed;Right hand  dominant   Lower Extremity Assessment Lower Extremity Assessment: Defer to PT evaluation       Communication Communication Communication: No apparent difficulties   Cognition Arousal: Alert Behavior During Therapy: WFL for tasks assessed/performed Cognition: No family/caregiver present to determine baseline, Cognition impaired     Awareness: Intellectual awareness impaired, Online awareness impaired Memory impairment (select all impairments): Short-term memory, Working Biochemist, Clinical functioning impairment (select all impairments): Sequencing, Initiation, Problem solving                   Following commands: Impaired Following commands impaired: Follows one step commands inconsistently, Follows one step commands with increased time     Cueing  General Comments   Cueing Techniques: Verbal cues;Visual cues;Gestural cues      Exercises     Shoulder Instructions      Home Living Family/patient expects to be discharged to:: Skilled nursing facility                                        Prior Functioning/Environment Prior Level of Function : Needs assist             Mobility Comments: reports he was walking with RW at SNF ADLs Comments: per chart, pt was having more difficulty caring for himself and getting to HD prior to admission in December    OT Problem List: Impaired balance (sitting and/or standing);Pain;Decreased knowledge of precautions;Decreased knowledge of use of DME or AE;Decreased strength   OT Treatment/Interventions: Self-care/ADL training;DME and/or AE instruction;Therapeutic activities;Patient/family education;Balance training      OT Goals(Current goals can be found in the care plan section)   Acute Rehab OT Goals OT Goal Formulation: With patient Time For Goal Achievement: 07/10/24 Potential to Achieve Goals: Good ADL Goals Pt Will Perform Lower Body Bathing: with supervision;sitting/lateral leans Pt Will  Perform Lower Body Dressing: with supervision;sitting/lateral leans Pt Will Transfer to Toilet: with contact guard assist;ambulating;bedside commode Pt Will Perform Toileting - Clothing Manipulation and hygiene: with supervision;sitting/lateral leans Additional ADL Goal #1: Pt will complete bed mobility mod I in preparation for ADLs.   OT Frequency:  Min 2X/week    Co-evaluation PT/OT/SLP Co-Evaluation/Treatment: Yes Reason for Co-Treatment: For patient/therapist safety;Necessary to address cognition/behavior during functional activity          AM-PAC OT 6 Clicks Daily Activity     Outcome Measure Help from another person eating meals?: None Help from another person taking care of personal grooming?: A Little Help from another person toileting, which includes using toliet, bedpan, or urinal?: A Lot Help from another person bathing (including washing, rinsing, drying)?: A Lot Help from another person to put on and taking off regular upper body clothing?: A Little Help from another person to put on and taking off regular lower body clothing?: A Lot 6 Click Score: 16   End of Session Equipment Utilized During Treatment: Gait belt;Rolling walker (2 wheels) Nurse Communication: Mobility status  Activity Tolerance: Patient tolerated treatment well Patient left: in chair;with call bell/phone within reach;with chair alarm set  OT Visit Diagnosis: Unsteadiness on feet (R26.81);Other abnormalities of gait and  mobility (R26.89);Pain;Other symptoms and signs involving cognitive function;Muscle weakness (generalized) (M62.81);History of falling (Z91.81) Pain - Right/Left: Right Pain - part of body: Hip                Time: 9091-9061 OT Time Calculation (min): 30 min Charges:  OT General Charges $OT Visit: 1 Visit OT Evaluation $OT Eval Moderate Complexity: 1 Mod  Mliss HERO, OTR/L Acute Rehabilitation Services Office: [phone]   Kennth Mliss Helling 06/26/2024, 11:54 AM

## 2024-06-27 MED ORDER — CHLORHEXIDINE GLUCONATE CLOTH 2 % EX PADS: 6.0000 | MEDICATED_PAD | Freq: Every day | CUTANEOUS | Status: DC

## 2024-06-27 NOTE — Progress Notes (Signed)
 " [facility] Progress Note   Subjective:   Seen on HD. Reports pain is well controlled at present. Denies SOB, CP, dizziness, nausea. Noted elevated BP this AM.   Objective Vitals:   06/27/24 0829 06/27/24 0830 06/27/24 0844 06/27/24 0900  BP: (!) 175/65 (!) 176/65 (!) 176/66 (!) 180/70  Pulse:  70 69 71  Resp:  (!) 9 (!) 8 (!) 8  Temp: 98 F (36.7 C)     TempSrc: Oral     SpO2:  99% 98% 99%  Weight:      Height:       Physical Exam General: Alert male in NAD Heart: RRR, no murmur Lungs: CTA anteriorly, respirations unlabored on RA Abdomen: Soft, non-distended Extremities: no peripheral edema Dialysis Access: AVG accessed    Additional Objective Labs: Basic Metabolic Panel: Recent Labs  Lab 06/24/24 1447 06/24/24 1453 06/24/24 1454 06/25/24 0522 06/25/24 1505  NA 139 140 140 139 136  K 4.9 4.3 4.3 5.2* 4.0  CL 98 101  --  99 95*  CO2 25  --   --  26  --   GLUCOSE 139* 140*  --  143* 76  BUN 51* 49*  --  58* 25*  CREATININE 7.83* 8.90*  --  8.97* 4.70*  CALCIUM  9.7  --   --  9.7  --   PHOS  --   --   --  4.5  --    Liver Function Tests: Recent Labs  Lab 06/24/24 1447 06/25/24 0522  AST 37  --   ALT 15  --   ALKPHOS 88  --   BILITOT 0.6  --   PROT 7.5  --   ALBUMIN  4.3 4.3   Recent Labs  Lab 06/24/24 1447  LIPASE 27   CBC: Recent Labs  Lab 06/24/24 1447 06/24/24 1453 06/24/24 1454 06/25/24 1505 06/26/24 0614  WBC 7.2  --   --   --  5.7  NEUTROABS 6.1  --   --   --   --   HGB 10.6*   < > 11.9* 12.6* 10.7*  HCT 33.2*   < > 35.0* 37.0* 32.3*  MCV 98.5  --   --   --  96.1  PLT 242  --   --   --  221   < > = values in this interval not displayed.   Blood Culture    Component Value Date/Time   SDES BLOOD RIGHT ARM 05/26/2024 1130   SDES BLOOD RIGHT HAND 05/26/2024 1130   SPECREQUEST  05/26/2024 1130    BOTTLES DRAWN AEROBIC AND ANAEROBIC Blood Culture adequate volume   SPECREQUEST  05/26/2024 1130    BOTTLES DRAWN AEROBIC  ONLY Blood Culture results may not be optimal due to an inadequate volume of blood received in culture bottles   CULT  05/26/2024 1130    NO GROWTH 5 DAYS Performed at [facility], [address] [place], [place] [address]    CULT  05/26/2024 1130    NO GROWTH 5 DAYS Performed at [facility], [address] [place], [place] [address]    REPTSTATUS 05/31/2024 FINAL 05/26/2024 1130   REPTSTATUS 05/31/2024 FINAL 05/26/2024 1130    Cardiac Enzymes: No results for input(s): CKTOTAL, CKMB, CKMBINDEX, TROPONINI in the last 168 hours. CBG: Recent Labs  Lab 06/25/24 1702  GLUCAP 73   Iron Studies:  Recent Labs    06/24/24 2303  IRON 27*  TIBC 276  FERRITIN  953*   @lablastinr3 @ Studies/Results: ECHOCARDIOGRAM COMPLETE Result Date: 06/26/2024    ECHOCARDIOGRAM REPORT   Patient Name:   Shawn Collins Date of Exam: 06/26/2024 Medical Rec #:  [id number]     Height:       70.0 in Accession #:    [id number]    Weight:       125.0 lb Date of Birth:  09-Aug-1950     BSA:          1.709 m Patient Age:    74 years      BP:           178/74 mmHg Patient Gender: M             HR:           69 bpm. Exam Location:  Inpatient Procedure: 2D Echo, Cardiac Doppler and Color Doppler (Both Spectral and Color            Flow Doppler were utilized during procedure). Indications:    Syncope R55  History:        Patient has prior history of Echocardiogram examinations, most                 recent 07/25/2022. Signs/Symptoms:Hypertensive Heart Disease.  Sonographer:    Nathanel Devonshire Referring Phys: [phone] NAIPING M XU IMPRESSIONS  1. Left ventricular ejection fraction, by estimation, is 60 to 65%. The left ventricle has normal function. The left ventricle has no regional wall motion abnormalities. There is mild concentric left ventricular hypertrophy. Left ventricular diastolic parameters are consistent with Grade II diastolic dysfunction (pseudonormalization).  2. Right ventricular systolic function is  mildly reduced. The right ventricular size is moderately enlarged. There is severely elevated pulmonary artery systolic pressure. The estimated right ventricular systolic pressure is 83.9 mmHg.  3. Left atrial size was mild to moderately dilated.  4. Right atrial size was mild to moderately dilated.  5. The mitral valve is abnormal. Moderate mitral valve regurgitation. No evidence of mitral stenosis. Moderate mitral annular calcification.  6. Tricuspid valve regurgitation is moderate to severe.  7. The aortic valve is tricuspid. There is severe calcifcation of the aortic valve. Aortic valve regurgitation is not visualized. Mild aortic valve stenosis. Aortic valve area, by VTI measures 1.70 cm. Aortic valve mean gradient measures 9.0 mmHg. Aortic valve Vmax measures 2.05 m/s.  8. The inferior vena cava is normal in size with <50% respiratory variability, suggesting right atrial pressure of 8 mmHg. FINDINGS  Left Ventricle: Left ventricular ejection fraction, by estimation, is 60 to 65%. The left ventricle has normal function. The left ventricle has no regional wall motion abnormalities. The left ventricular internal cavity size was normal in size. There is  mild concentric left ventricular hypertrophy. Left ventricular diastolic parameters are consistent with Grade II diastolic dysfunction (pseudonormalization). Right Ventricle: The right ventricular size is moderately enlarged. No increase in right ventricular wall thickness. Right ventricular systolic function is mildly reduced. There is severely elevated pulmonary artery systolic pressure. The tricuspid regurgitant velocity is 4.15 m/s, and with an assumed right atrial pressure of 15 mmHg, the estimated right ventricular systolic pressure is 83.9 mmHg. Left Atrium: Left atrial size was mild to moderately dilated. Right Atrium: Right atrial size was mild to moderately dilated. Pericardium: There is no evidence of pericardial effusion. Mitral Valve: The mitral  valve is abnormal. There is moderate thickening of the mitral valve leaflet(s). There is moderate calcification of the mitral valve leaflet(s). Moderate mitral annular calcification. Moderate mitral  valve regurgitation. No evidence of mitral valve stenosis. MV peak gradient, 8.4 mmHg. The mean mitral valve gradient is 3.0 mmHg. Tricuspid Valve: The tricuspid valve is normal in structure. Tricuspid valve regurgitation is moderate to severe. No evidence of tricuspid stenosis. Aortic Valve: The aortic valve is tricuspid. There is severe calcifcation of the aortic valve. Aortic valve regurgitation is not visualized. Mild aortic stenosis is present. Aortic valve mean gradient measures 9.0 mmHg. Aortic valve peak gradient measures 16.8 mmHg. Aortic valve area, by VTI measures 1.70 cm. Pulmonic Valve: The pulmonic valve was normal in structure. Pulmonic valve regurgitation is not visualized. No evidence of pulmonic stenosis. Aorta: The aortic root is normal in size and structure. Venous: The inferior vena cava is normal in size with less than 50% respiratory variability, suggesting right atrial pressure of 8 mmHg. IAS/Shunts: No atrial level shunt detected by color flow Doppler.  LEFT VENTRICLE PLAX 2D LVIDd:         4.60 cm     Diastology LVIDs:         2.90 cm     LV e' medial:    3.81 cm/s LV PW:         0.90 cm     LV E/e' medial:  40.4 LV IVS:        1.00 cm     LV e' lateral:   7.10 cm/s LVOT diam:     1.90 cm     LV E/e' lateral: 21.7 LV SV:         73 LV SV Index:   43 LVOT Area:     2.84 cm LV IVRT:       56 msec  LV Volumes (MOD) LV vol d, MOD A2C: 96.1 ml LV vol d, MOD A4C: 64.7 ml LV vol s, MOD A2C: 26.8 ml LV vol s, MOD A4C: 21.6 ml LV SV MOD A2C:     69.3 ml LV SV MOD A4C:     64.7 ml LV SV MOD BP:      57.8 ml RIGHT VENTRICLE          IVC RV Basal diam:  3.20 cm  IVC diam: 1.90 cm TAPSE (M-mode): 1.6 cm                          PULMONARY VEINS                          Diastolic Velocity: 53.50 cm/s                           S/D Velocity:       0.60                          Systolic Velocity:  31.40 cm/s LEFT ATRIUM             Index LA diam:        3.80 cm 2.22 cm/m LA Vol (A2C):   64.5 ml 37.73 ml/m LA Vol (A4C):   43.1 ml 25.21 ml/m LA Biplane Vol: 53.4 ml 31.24 ml/m  AORTIC VALVE                     PULMONIC VALVE AV Area (Vmax):    1.66 cm      PV Vmax:  1.18 m/s AV Area (Vmean):   1.58 cm      PV Peak grad:  5.6 mmHg AV Area (VTI):     1.70 cm AV Vmax:           205.00 cm/s AV Vmean:          132.000 cm/s AV VTI:            0.428 m AV Peak Grad:      16.8 mmHg AV Mean Grad:      9.0 mmHg LVOT Vmax:         120.00 cm/s LVOT Vmean:        73.400 cm/s LVOT VTI:          0.257 m LVOT/AV VTI ratio: 0.60  AORTA Ao Root diam: 3.00 cm Ao Asc diam:  2.70 cm MITRAL VALVE                TRICUSPID VALVE MV Area (PHT): 4.60 cm     TR Peak grad:   68.9 mmHg MV Area VTI:   1.86 cm     TR Vmax:        415.00 cm/s MV Peak grad:  8.4 mmHg MV Mean grad:  3.0 mmHg     SHUNTS MV Vmax:       1.45 m/s     Systemic VTI:  0.26 m MV Vmean:      78.1 cm/s    Systemic Diam: 1.90 cm MV Decel Time: 165 msec MV E velocity: 154.00 cm/s MV A velocity: 84.60 cm/s MV E/A ratio:  1.82 Toribio Fuel MD Electronically signed by Toribio Fuel MD Signature Date/Time: 06/26/2024/3:48:41 PM    Final    DG HIP UNILAT W OR W/O PELVIS 2-3 VIEWS RIGHT Result Date: 06/25/2024 EXAM: 2 or 3 VIEW(S) XRAY OF THE RIGHT HIP 06/25/2024 05:22:00 PM COMPARISON: 06/24/2024 CLINICAL HISTORY: Deficient knowledge of open reduction and internal fixation of hip. FINDINGS: BONES AND JOINTS: ORIF of right femoral neck fracture with 3 partially threaded screws in place. SOFT TISSUES: Postoperative air within soft tissues. Vascular calcifications. IMPRESSION: 1. ORIF of right femoral neck fracture with 3 partially threaded screws in place. 2. Postoperative air within the soft tissues. Electronically signed by: Oneil Devonshire MD 06/25/2024 11:41 PM EST RP  Workstation: [id number]   DG HIP UNILAT WITH PELVIS 2-3 VIEWS RIGHT Result Date: 06/25/2024 EXAM: FLUOROSCOPIC IMAGING TECHNIQUE: Fluoroscopy was provided by the radiology department for procedure. Radiologist was not present during examination. RADIATION DOSE INDEX: Reference Air Kerma: 14.14 mGy. Fluoroscopy time: 72.2 seconds. Images: 5 COMPARISON: 06/24/2024. CLINICAL HISTORY: Right femoral neck fracture with impaction. FINDINGS: Intraoperative fluoroscopic imaging was performed. Initial images again demonstrate the mildly impacted right femoral neck fracture. Three fixation screws were then placed across the femoral neck. IMPRESSION: 1. Intraoperative fluoroscopic imaging as above. Please refer to the operative report for full details. Electronically signed by: Oneil Devonshire MD 06/25/2024 11:40 PM EST RP Workstation: [id number] C-Arm 1-60 Min-No Report Result Date: 06/25/2024 Fluoroscopy was utilized by the requesting physician.  No radiographic interpretation.   NM Pulmonary Perfusion Result Date: 06/25/2024 EXAM: NM Lung Perfusion Scan. CLINICAL HISTORY: Pulmonary embolism (PE) suspected, high probability; Syncope or presyncope, cerebrovascular cause suspected. Patient has been experiencing shortness of breath (SOB). Patient has chronic obstructive pulmonary disease (COPD). Has not been a smoker for 50+ years. TECHNIQUE: Radiolabeled MAA was administered intravenously via the right forearm at 13:55. Planar images of the lungs were obtained in multiple projections.  RADIOPHARMACEUTICAL: 3.94 mCi Technetium-22m (Tc-63m) Macroaggregated Albumin  (MAA) injection solution. COMPARISON: CT chest and pelvis 06/24/2024 and chest x-ray 06/24/2024. FINDINGS: PERFUSION: No wedge-shaped peripheral perfusion defect within left or right lung to suggest acute pulmonary embolism. Normal perfusion pattern. Prominent cardiac attenuation. IMPRESSION: 1. No wedge-shaped peripheral perfusion defect within the left or  right lung to suggest acute pulmonary embolism; normal perfusion pattern. Electronically signed by: Norleen Boxer MD 06/25/2024 04:02 PM EST RP Workstation: [id number]   Medications:  sodium chloride  75 mL/hr at 06/25/24 1820    Chlorhexidine  Gluconate Cloth  6 each Topical Q0600   Chlorhexidine  Gluconate Cloth  6 each Topical Daily   clopidogrel   75 mg Oral Daily   docusate sodium   100 mg Oral BID   doxercalciferol   1 mcg Intravenous Q M,W,F-HD   feeding supplement (NEPRO CARB STEADY)  237 mL Oral BID BM   irbesartan   300 mg Oral Daily   multivitamin  1 tablet Oral QHS   mupirocin  ointment  1 Application Nasal BID   sevelamer  carbonate  1,600 mg Oral TID WC   And   sevelamer  carbonate  800 mg Oral With snacks    Dialysis Orders: [facility] MWF  2k/2.5 ca BF 450 ml/min DF 800 ml/min EDW 57.6 kg  Profile 2 3 hrs and 45 minutes Access:  AV graft Meds: mircera 50 mcg every 2 weeks (last given 06/12/24); venofer 50 mg weekly Hectorol  1 mcg three times a week  Assessment/Plan: # ESRD  - off schedule this week - Will  transition to MWF schedule tomorrow as staffing/ortho plans permit   # Right femoral neck fracture  -per ortho - Please avoid morphine  for pain due to ESRD (toxic metabolites accumulate) - If an IV agent is needed, would recommend fentanyl  or dilaudid .  - Please avoid any continuous fluids   # HTN  - Hypertensive with pain as well as known HTN - optimize volume status with HD   - pain control per primary team  - continue home meds as tolerated per primary team    # Anemia of CKD  - Hb acceptable - no ESA indicated    # Metabolic bone disease  - resume home renvela  - resume hectorol   - phos at goal  Lucie Collet, PA-C 06/27/2024, 9:12 AM  [facility] Pager: [phone]   "

## 2024-06-27 NOTE — Progress Notes (Signed)
 " PROGRESS NOTE    [facility]  FMW:[id number] DOB: [birth year] DOA: 06/24/2024 PCP: Shawn Atlas, MD  Subjective: Patient was seen in the hemodialysis unit.  He was in a stretcher. pain control remains adequate per his report.SABRA  His breathing was comfortable.  He had no other complaints.    [facility] Course: No notes on file   Assessment and Plan:  Right hip fracture      -- He is postop day 2.  Pain is controlled. Check cbc.  2.   End-stage renal disease      -Electrolytes and volume status are adequate.  Ionized calcium  slightly low.  No bleeding.check renal panel.  3.   Question of pulmonary embolism as a cause of syncope       - Nuclear medicine perfusion scan was low probability.  SpO2 98% on 2 L     DVT prophylaxis: SCDs Start: 06/25/24 1757 Place TED hose Start: 06/25/24 1757 Place and maintain sequential compression device Start: 06/24/24 1843 SCDs Start: 06/24/24 1832     Code Status: Do not attempt resuscitation (DNR) PRE-ARREST INTERVENTIONS DESIRED Family Communication: None available Disposition Plan: Hopefully will be able to return to [facility]. Reason for continuing need for hospitalization: Postoperative monitoring/need for hemodialysis in an inpatient setting due to fresh hip fracture repair.  Objective: Vitals:   06/27/24 0844 06/27/24 0900 06/27/24 0930 06/27/24 1000  BP: (!) 176/66 (!) 180/70 (!) 180/64 (!) 174/73  Pulse: 69 71 69 66  Resp: (!) 8 (!) 8 (!) 6 11  Temp:      TempSrc:      SpO2: 98% 99% 100% 100%  Weight:      Height:        Intake/Output Summary (Last 24 hours) at 06/27/2024 1042 Last data filed at 06/27/2024 0900 Gross per 24 hour  Intake 630 ml  Output --  Net 630 ml   Filed Weights   06/25/24 0800 06/25/24 1510 06/27/24 0819  Weight: 58.9 kg 56.7 kg 59.3 kg    Examination:  Chronically ill male in the dialysis unit, in the bed. Currently looks comfortable.  Work of breathing is normal HEENT: PERRLA  EOMI no  scleral icterus CV: Regular rate and rhythm S1-S2 no murmurs rubs gallops Lungs: Clear Abdomen: Soft, bowel sounds present extremities Extremities: Both lower extremities without edema and neurovascularly intact, dressing over right lateral hip is clean dry and intact Neurologic: He is awake alert coherent can move his upper extremities and his left lower extremity.  Right lower extremity movement is slightly impaired due to surgery   Data Reviewed: I have personally reviewed following labs and imaging studies  CBC: Recent Labs  Lab 06/24/24 1447 06/24/24 1453 06/24/24 1454 06/25/24 1505 06/26/24 0614  WBC 7.2  --   --   --  5.7  NEUTROABS 6.1  --   --   --   --   HGB 10.6* 11.6* 11.9* 12.6* 10.7*  HCT 33.2* 34.0* 35.0* 37.0* 32.3*  MCV 98.5  --   --   --  96.1  PLT 242  --   --   --  221   Basic Metabolic Panel: Recent Labs  Lab 06/24/24 1447 06/24/24 1453 06/24/24 1454 06/25/24 0522 06/25/24 1505  NA 139 140 140 139 136  K 4.9 4.3 4.3 5.2* 4.0  CL 98 101  --  99 95*  CO2 25  --   --  26  --   GLUCOSE 139* 140*  --  143* 76  BUN 51* 49*  --  58* 25*  CREATININE 7.83* 8.90*  --  8.97* 4.70*  CALCIUM  9.7  --   --  9.7  --   PHOS  --   --   --  4.5  --    GFR: Estimated Creatinine Clearance: 11.4 mL/min (A) (by C-G formula based on SCr of 4.7 mg/dL (H)). Liver Function Tests: Recent Labs  Lab 06/24/24 1447 06/25/24 0522  AST 37  --   ALT 15  --   ALKPHOS 88  --   BILITOT 0.6  --   PROT 7.5  --   ALBUMIN  4.3 4.3   Recent Labs  Lab 06/24/24 1447  LIPASE 27   No results for input(s): AMMONIA in the last 168 hours. Coagulation Profile: Recent Labs  Lab 06/24/24 1503  INR 1.3*   Cardiac Enzymes: No results for input(s): CKTOTAL, CKMB, CKMBINDEX, TROPONINI in the last 168 hours. ProBNP, BNP (last 5 results) Recent Labs    07/10/23 1302 06/24/24 2303  PROBNP  --  17,514.0*  BNP >4,500.0*  --    HbA1C: No results for input(s): HGBA1C  in the last 72 hours. CBG: Recent Labs  Lab 06/25/24 1702  GLUCAP 73   Lipid Profile: No results for input(s): CHOL, HDL, LDLCALC, TRIG, CHOLHDL, LDLDIRECT in the last 72 hours. Thyroid  Function Tests: Recent Labs    06/24/24 2303  TSH 3.490  FREET4 1.29   Anemia Panel: Recent Labs    06/24/24 2303  VITAMINB12 778  FOLATE 10.7  FERRITIN 953*  TIBC 276  IRON 27*  RETICCTPCT 1.6   Sepsis Labs: Recent Labs  Lab 06/24/24 1454 06/24/24 2316  LATICACIDVEN 0.8 1.0    Recent Results (from the past 240 hours)  Surgical pcr screen     Status: Abnormal   Collection Time: 06/25/24  3:10 PM   Specimen: Nasal Mucosa; Nasal Swab  Result Value Ref Range Status   MRSA, PCR POSITIVE (A) NEGATIVE Final    Comment: RESULT CALLED TO, READ BACK BY AND VERIFIED WITH: RN [place] on [phone] @1825  by SM    Staphylococcus aureus POSITIVE (A) NEGATIVE Final    Comment: (NOTE) The Xpert SA Assay (FDA approved for NASAL specimens in patients 75 years of age and older), is one component of a comprehensive surveillance program. It is not intended to diagnose infection nor to guide or monitor treatment. Performed at [facility], [address] [place], [place] [address]      Radiology Studies: ECHOCARDIOGRAM COMPLETE Result Date: 06/26/2024    ECHOCARDIOGRAM REPORT   Patient Name:   Shawn Collins Date of Exam: 06/26/2024 Medical Rec #:  [id number]     Height:       70.0 in Accession #:    [id number]    Weight:       125.0 lb Date of Birth:  Oct 16, 1950     BSA:          1.709 m Patient Age:    73 years      BP:           178/74 mmHg Patient Gender: M             HR:           69 bpm. Exam Location:  Inpatient Procedure: 2D Echo, Cardiac Doppler and Color Doppler (Both Spectral and Color            Flow Doppler were utilized during procedure).  Indications:    Syncope R55  History:        Patient has prior history of Echocardiogram examinations, most                 recent  07/25/2022. Signs/Symptoms:Hypertensive Heart Disease.  Sonographer:    [facility] Referring Phys: [id number] [facility] IMPRESSIONS  1. Left ventricular ejection fraction, by estimation, is 60 to 65%. The left ventricle has normal function. The left ventricle has no regional wall motion abnormalities. There is mild concentric left ventricular hypertrophy. Left ventricular diastolic parameters are consistent with Grade II diastolic dysfunction (pseudonormalization).  2. Right ventricular systolic function is mildly reduced. The right ventricular size is moderately enlarged. There is severely elevated pulmonary artery systolic pressure. The estimated right ventricular systolic pressure is 83.9 mmHg.  3. Left atrial size was mild to moderately dilated.  4. Right atrial size was mild to moderately dilated.  5. The mitral valve is abnormal. Moderate mitral valve regurgitation. No evidence of mitral stenosis. Moderate mitral annular calcification.  6. Tricuspid valve regurgitation is moderate to severe.  7. The aortic valve is tricuspid. There is severe calcifcation of the aortic valve. Aortic valve regurgitation is not visualized. Mild aortic valve stenosis. Aortic valve area, by VTI measures 1.70 cm. Aortic valve mean gradient measures 9.0 mmHg. Aortic valve Vmax measures 2.05 m/s.  8. The inferior vena cava is normal in size with <50% respiratory variability, suggesting right atrial pressure of 8 mmHg. FINDINGS  Left Ventricle: Left ventricular ejection fraction, by estimation, is 60 to 65%. The left ventricle has normal function. The left ventricle has no regional wall motion abnormalities. The left ventricular internal cavity size was normal in size. There is  mild concentric left ventricular hypertrophy. Left ventricular diastolic parameters are consistent with Grade II diastolic dysfunction (pseudonormalization). Right Ventricle: The right ventricular size is moderately enlarged. No increase in right ventricular  wall thickness. Right ventricular systolic function is mildly reduced. There is severely elevated pulmonary artery systolic pressure. The tricuspid regurgitant velocity is 4.15 m/s, and with an assumed right atrial pressure of 15 mmHg, the estimated right ventricular systolic pressure is 83.9 mmHg. Left Atrium: Left atrial size was mild to moderately dilated. Right Atrium: Right atrial size was mild to moderately dilated. Pericardium: There is no evidence of pericardial effusion. Mitral Valve: The mitral valve is abnormal. There is moderate thickening of the mitral valve leaflet(s). There is moderate calcification of the mitral valve leaflet(s). Moderate mitral annular calcification. Moderate mitral valve regurgitation. No evidence of mitral valve stenosis. MV peak gradient, 8.4 mmHg. The mean mitral valve gradient is 3.0 mmHg. Tricuspid Valve: The tricuspid valve is normal in structure. Tricuspid valve regurgitation is moderate to severe. No evidence of tricuspid stenosis. Aortic Valve: The aortic valve is tricuspid. There is severe calcifcation of the aortic valve. Aortic valve regurgitation is not visualized. Mild aortic stenosis is present. Aortic valve mean gradient measures 9.0 mmHg. Aortic valve peak gradient measures 16.8 mmHg. Aortic valve area, by VTI measures 1.70 cm. Pulmonic Valve: The pulmonic valve was normal in structure. Pulmonic valve regurgitation is not visualized. No evidence of pulmonic stenosis. Aorta: The aortic root is normal in size and structure. Venous: The inferior vena cava is normal in size with less than 50% respiratory variability, suggesting right atrial pressure of 8 mmHg. IAS/Shunts: No atrial level shunt detected by color flow Doppler.  LEFT VENTRICLE PLAX 2D LVIDd:         4.60 cm  Diastology LVIDs:         2.90 cm     LV e' medial:    3.81 cm/s LV PW:         0.90 cm     LV E/e' medial:  40.4 LV IVS:        1.00 cm     LV e' lateral:   7.10 cm/s LVOT diam:     1.90 cm      LV E/e' lateral: 21.7 LV SV:         73 LV SV Index:   43 LVOT Area:     2.84 cm LV IVRT:       56 msec  LV Volumes (MOD) LV vol d, MOD A2C: 96.1 ml LV vol d, MOD A4C: 64.7 ml LV vol s, MOD A2C: 26.8 ml LV vol s, MOD A4C: 21.6 ml LV SV MOD A2C:     69.3 ml LV SV MOD A4C:     64.7 ml LV SV MOD BP:      57.8 ml RIGHT VENTRICLE          IVC RV Basal diam:  3.20 cm  IVC diam: 1.90 cm TAPSE (M-mode): 1.6 cm                          PULMONARY VEINS                          Diastolic Velocity: 53.50 cm/s                          S/D Velocity:       0.60                          Systolic Velocity:  31.40 cm/s LEFT ATRIUM             Index LA diam:        3.80 cm 2.22 cm/m LA Vol (A2C):   64.5 ml 37.73 ml/m LA Vol (A4C):   43.1 ml 25.21 ml/m LA Biplane Vol: 53.4 ml 31.24 ml/m  AORTIC VALVE                     PULMONIC VALVE AV Area (Vmax):    1.66 cm      PV Vmax:       1.18 m/s AV Area (Vmean):   1.58 cm      PV Peak grad:  5.6 mmHg AV Area (VTI):     1.70 cm AV Vmax:           205.00 cm/s AV Vmean:          132.000 cm/s AV VTI:            0.428 m AV Peak Grad:      16.8 mmHg AV Mean Grad:      9.0 mmHg LVOT Vmax:         120.00 cm/s LVOT Vmean:        73.400 cm/s LVOT VTI:          0.257 m LVOT/AV VTI ratio: 0.60  AORTA Ao Root diam: 3.00 cm Ao Asc diam:  2.70 cm MITRAL VALVE                TRICUSPID VALVE MV Area (PHT): 4.60 cm  TR Peak grad:   68.9 mmHg MV Area VTI:   1.86 cm     TR Vmax:        415.00 cm/s MV Peak grad:  8.4 mmHg MV Mean grad:  3.0 mmHg     SHUNTS MV Vmax:       1.45 m/s     Systemic VTI:  0.26 m MV Vmean:      78.1 cm/s    Systemic Diam: 1.90 cm MV Decel Time: 165 msec MV E velocity: 154.00 cm/s MV A velocity: 84.60 cm/s MV E/A ratio:  1.82 Toribio Fuel MD Electronically signed by Toribio Fuel MD Signature Date/Time: 06/26/2024/3:48:41 PM    Final    DG HIP UNILAT W OR W/O PELVIS 2-3 VIEWS RIGHT Result Date: 06/25/2024 EXAM: 2 or 3 VIEW(S) XRAY OF THE RIGHT HIP 06/25/2024  05:22:00 PM COMPARISON: 06/24/2024 CLINICAL HISTORY: Deficient knowledge of open reduction and internal fixation of hip. FINDINGS: BONES AND JOINTS: ORIF of right femoral neck fracture with 3 partially threaded screws in place. SOFT TISSUES: Postoperative air within soft tissues. Vascular calcifications. IMPRESSION: 1. ORIF of right femoral neck fracture with 3 partially threaded screws in place. 2. Postoperative air within the soft tissues. Electronically signed by: Oneil Devonshire MD 06/25/2024 11:41 PM EST RP Workstation: [id number]   DG HIP UNILAT WITH PELVIS 2-3 VIEWS RIGHT Result Date: 06/25/2024 EXAM: FLUOROSCOPIC IMAGING TECHNIQUE: Fluoroscopy was provided by the radiology department for procedure. Radiologist was not present during examination. RADIATION DOSE INDEX: Reference Air Kerma: 14.14 mGy. Fluoroscopy time: 72.2 seconds. Images: 5 COMPARISON: 06/24/2024. CLINICAL HISTORY: Right femoral neck fracture with impaction. FINDINGS: Intraoperative fluoroscopic imaging was performed. Initial images again demonstrate the mildly impacted right femoral neck fracture. Three fixation screws were then placed across the femoral neck. IMPRESSION: 1. Intraoperative fluoroscopic imaging as above. Please refer to the operative report for full details. Electronically signed by: Oneil Devonshire MD 06/25/2024 11:40 PM EST RP Workstation: [id number] C-Arm 1-60 Min-No Report Result Date: 06/25/2024 Fluoroscopy was utilized by the requesting physician.  No radiographic interpretation.   NM Pulmonary Perfusion Result Date: 06/25/2024 EXAM: NM Lung Perfusion Scan. CLINICAL HISTORY: Pulmonary embolism (PE) suspected, high probability; Syncope or presyncope, cerebrovascular cause suspected. Patient has been experiencing shortness of breath (SOB). Patient has chronic obstructive pulmonary disease (COPD). Has not been a smoker for 50+ years. TECHNIQUE: Radiolabeled MAA was administered intravenously via the right forearm at  13:55. Planar images of the lungs were obtained in multiple projections. RADIOPHARMACEUTICAL: 3.94 mCi Technetium-67m (Tc-39m) Macroaggregated Albumin  (MAA) injection solution. COMPARISON: CT chest and pelvis 06/24/2024 and chest x-ray 06/24/2024. FINDINGS: PERFUSION: No wedge-shaped peripheral perfusion defect within left or right lung to suggest acute pulmonary embolism. Normal perfusion pattern. Prominent cardiac attenuation. IMPRESSION: 1. No wedge-shaped peripheral perfusion defect within the left or right lung to suggest acute pulmonary embolism; normal perfusion pattern. Electronically signed by: Norleen Boxer MD 06/25/2024 04:02 PM EST RP Workstation: [id number]    Scheduled Meds:  Chlorhexidine  Gluconate Cloth  6 each Topical Q0600   Chlorhexidine  Gluconate Cloth  6 each Topical Daily   clopidogrel   75 mg Oral Daily   docusate sodium   100 mg Oral BID   doxercalciferol   1 mcg Intravenous Q M,W,F-HD   feeding supplement (NEPRO CARB STEADY)  237 mL Oral BID BM   irbesartan   300 mg Oral Daily   multivitamin  1 tablet Oral QHS   mupirocin  ointment  1 Application Nasal BID   sevelamer  carbonate  1,600  mg Oral TID WC   And   sevelamer  carbonate  800 mg Oral With snacks   Continuous Infusions:  sodium chloride  75 mL/hr at 06/25/24 1820     LOS: 3 days   Time spent: 30 minutes  Lonni KANDICE Moose, MD  [facility]  06/27/2024, 10:42 AM   "

## 2024-06-27 NOTE — NC FL2 (Signed)
 " [facility] [place]  MEDICAID FL2 LEVEL OF CARE FORM     IDENTIFICATION  Patient Name: Shawn Collins Birthdate: May 02, 1951 Sex: male Admission Date (Current Location): 06/24/2024  [place] and [place] Number:  [place] [id number] Facility and Address:  [facility], [address] [place], [place] [address]      Provider Number: [id number]  Attending Physician Name and Address:  Maranda Lonni MATSU, MD  Relative Name and Phone Number:  Gerlean [name] Benne   [phone]    Current Level of Care: Hospital Recommended Level of Care: Skilled Nursing Facility Prior Approval Number:    Date Approved/Denied:   PASRR Number: [id number]  Discharge Plan: SNF    Current Diagnoses: Patient Active Problem List   Diagnosis Date Noted   Protein-calorie malnutrition, severe 06/26/2024   Traumatic closed nondisplaced fracture of neck of right femur, initial encounter (HCC) 06/24/2024   Acute acalculous cholecystitis 05/25/2024   Hypertensive emergency 03/23/2024   Cognitive impairment 03/22/2024   Dry gangrene (HCC) 03/22/2024   HTN (hypertension) 09/13/2023   COVID-19 07/10/2023   Hyperkalemia 07/10/2023   Metabolic acidosis with increased anion gap and accumulation of organic acids 07/10/2023   Hypertensive urgency 07/10/2023   Hypoglycemia 07/10/2023   Weakness 07/10/2023   Syncope 07/05/2022   Syncope and collapse 07/04/2022   Type 2 diabetes mellitus (HCC) 07/04/2022   Anemia of chronic disease 07/04/2022   Acute metabolic encephalopathy due to hypoglycemia 12/30/2021   Hypothermia 12/30/2021   Uncontrolled type 2 diabetes mellitus with hypoglycemia, with long-term current use of insulin  (HCC) 12/30/2021   History of CVA (cerebrovascular accident) 12/30/2021   DNR (do not resuscitate) 12/30/2021   Stroke determined by clinical assessment (HCC) 12/18/2021   ESRD (end stage renal disease) on dialysis (HCC) 02/23/2021   DOE (dyspnea on exertion) 11/22/2017    Hyperlipidemia LDL goal <70 04/17/2017   CAD (coronary artery disease)    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 09/10/2014   Diabetes mellitus without complication (HCC) 09/10/2014   Hypertension 09/10/2014   Emphysema lung (HCC) 09/10/2014    Orientation RESPIRATION BLADDER Height & Weight     Self, Time, Situation  O2   Weight: 127 lb 3.3 oz (57.7 kg) Height:  5' 10 (177.8 cm)  BEHAVIORAL SYMPTOMS/MOOD NEUROLOGICAL BOWEL NUTRITION STATUS        Diet (see discharge summary)  AMBULATORY STATUS COMMUNICATION OF NEEDS Skin   Limited Assist Verbally Surgical wounds                       Personal Care Assistance Level of Assistance  Bathing, Feeding, Dressing Bathing Assistance: Maximum assistance Feeding assistance: Independent Dressing Assistance: Maximum assistance     Functional Limitations Info  Sight, Hearing, Speech Sight Info: Adequate Hearing Info: Adequate Speech Info: Adequate    SPECIAL CARE FACTORS FREQUENCY  PT (By licensed PT), OT (By licensed OT)     PT Frequency: 5x week OT Frequency: 5x week            Contractures Contractures Info: Not present    Additional Factors Info  Code Status,  Allergies Code Status Info: DNR Allergies Info: NKA           Current Medications (06/27/2024):  This is the current hospital active medication list Current Facility-Administered Medications  Medication Dose Route Frequency Provider Last Rate Last Admin   0.9 %  sodium chloride  infusion   Intravenous Continuous Jerri Kay HERO, MD 75 mL/hr at 06/25/24 1820  New Bag at 06/25/24 1820   acetaminophen  (TYLENOL ) tablet 325-650 mg  325-650 mg Oral Q6H PRN Jerri Kay HERO, MD       alum & mag hydroxide-simeth (MAALOX/MYLANTA) 200-200-20 MG/5ML suspension 30 mL  30 mL Oral Q4H PRN Jerri Kay HERO, MD       Chlorhexidine  Gluconate Cloth 2 % PADS 6 each  6 each Topical Q0600 Jerri Kay HERO, MD   6 each at 06/27/24 9357   Chlorhexidine  Gluconate Cloth 2 % PADS 6 each  6 each Topical Daily Maranda Lonni MATSU, MD   6 each at 06/26/24 0854   clopidogrel  (PLAVIX ) tablet 75 mg  75 mg Oral Daily Chen, Lydia D, RPH   75 mg at 06/27/24 1435   docusate sodium  (COLACE) capsule 100 mg  100 mg Oral BID Jerri Kay HERO, MD   100 mg at 06/27/24 1435   doxercalciferol  (HECTOROL ) injection 1 mcg  1 mcg Intravenous Q M,W,F-HD Jerri Kay HERO, MD   1 mcg at 06/25/24 1159   feeding supplement (NEPRO CARB STEADY) liquid 237 mL  237 mL Oral BID BM Maranda Lonni MATSU, MD   237 mL at 06/27/24 1439   hydrALAZINE  (APRESOLINE ) injection 10 mg  10 mg Intravenous Q4H PRN Jerri Kay HERO, MD   10 mg at 06/27/24 9357   HYDROcodone -acetaminophen  (NORCO) 7.5-325 MG per tablet 1-2 tablet  1-2 tablet Oral Daily PRN Jerri Kay HERO, MD       HYDROcodone -acetaminophen  (NORCO/VICODIN) 5-325 MG per tablet 1-2 tablet  1-2 tablet Oral BID PRN Jerri Kay HERO, MD   1 tablet at 06/26/24 2201   irbesartan  (AVAPRO ) tablet 300 mg  300 mg Oral Daily Jerri Kay HERO, MD   300 mg at 06/27/24 1435   magnesium  citrate solution 1 Bottle  1 Bottle Oral Once PRN Jerri Kay HERO, MD       menthol  (CEPACOL) lozenge 3 mg  1 lozenge Oral PRN Jerri Kay HERO, MD       Or   phenol (CHLORASEPTIC) mouth spray 1 spray  1 spray Mouth/Throat PRN Jerri Kay HERO, MD       methocarbamol  (ROBAXIN ) tablet 500 mg  500 mg Oral Q8H PRN Jerri Kay HERO, MD       Or   methocarbamol  (ROBAXIN ) injection 500 mg  500 mg Intravenous Q8H PRN Jerri Kay HERO, MD       morphine  (PF) 2 MG/ML injection 0.5-1 mg  0.5-1 mg Intravenous Q2H PRN Jerri Kay HERO, MD        multivitamin (RENA-VIT) tablet 1 tablet  1 tablet Oral QHS Maranda Lonni MATSU, MD       mupirocin  ointment (BACTROBAN ) 2 % 1 Application  1 Application Nasal BID Maranda Lonni MATSU, MD   1 Application at 06/27/24 1439   ondansetron  (ZOFRAN ) tablet 4 mg  4 mg Oral Q6H PRN Jerri Kay HERO, MD       Or   ondansetron  (ZOFRAN ) injection 4 mg  4 mg Intravenous Q6H PRN Jerri Kay HERO, MD       polyethylene glycol (MIRALAX  / GLYCOLAX ) packet 17 g  17 g Oral Daily PRN Jerri Kay HERO, MD       sevelamer  carbonate (RENVELA ) tablet 1,600 mg  1,600 mg Oral TID WC Jerri Kay HERO, MD   1,600 mg at 06/27/24 1433   And   sevelamer  carbonate (RENVELA ) tablet 800 mg  800 mg Oral With snacks Jerri Kay HERO, MD   800 mg at 06/26/24 2159   sorbitol  70 % solution 30 mL  30 mL Oral Daily PRN Jerri Kay HERO, MD         Discharge Medications: Please see discharge summary for a list of discharge medications.  Relevant Imaging Results:  Relevant Lab Results:   Additional Information SSN: [id number]; out-pt HD at [facility] on MWF 10:25 am chair time.  Bridget Cordella Simmonds, LCSW     "

## 2024-06-27 NOTE — Progress Notes (Signed)
" °   06/27/24 1303  Vitals  Temp (!) 97.4 F (36.3 C)  Temp Source Oral  BP (!) 194/67  MAP (mmHg) 104  Pulse Rate 68  ECG Heart Rate 69  Resp 12  Weight 57.7 kg  Type of Weight Post-Dialysis  Oxygen Therapy  SpO2 100 %  O2 Device Nasal Cannula  O2 Flow Rate (L/min) 2 L/min  During Treatment Monitoring  Blood Flow Rate (mL/min) 350 mL/min  Arterial Pressure (mmHg) -4.04 mmHg  Venous Pressure (mmHg) 210.9 mmHg  TMP (mmHg) 2.42 mmHg  Ultrafiltration Rate (mL/min) 0 mL/min  Dialysate Flow Rate (mL/min) 299 ml/min  Dialysate Potassium Concentration 2  Dialysate Calcium  Concentration 2.5  Duration of HD Treatment -hour(s) 3.75 hour(s)  Cumulative Fluid Removed (mL) per Treatment  1722.15  HD Safety Checks Performed Yes  Intra-Hemodialysis Comments Tx completed  Post Treatment  Dialyzer Clearance Clear  Liters Processed 83.7  Fluid Removed (mL) 1700 mL  Tolerated HD Treatment Yes  AVG/AVF Arterial Site Held (minutes) 10 minutes  AVG/AVF Venous Site Held (minutes) 10 minutes  Fistula / Graft Left Upper arm Arteriovenous vein graft  Placement Date/Time: 12/21/21 1103   Orientation: Left  Access Location: Upper arm  Access Type: Arteriovenous vein graft  Site Condition No complications  Fistula / Graft Assessment Present;Thrill;Bruit  Status Accessed;Deaccessed  Drainage Description None    "

## 2024-06-27 NOTE — TOC Initial Note (Signed)
 Transition of Care [facility]) - Initial/Assessment Note    Patient Details  Name: Shawn Collins MRN: [id number] Date of Birth: 04/28/1951  Transition of Care [facility]) CM/SW Contact:    Bridget Cordella Simmonds, LCSW Phone Number: 06/27/2024, 3:17 PM  Clinical Narrative:       CSW spoke with pt regarding PT recommendation for SNF.  Pt confirms he is from [place], does plan to return.  Pt from home alone, no services prior to going to [place].  Permission given to speak with friend [name], niece [name].  CSW confirmed with [facility]: pt can return when ready.  HD pt.             Expected Discharge Plan: Skilled Nursing Facility Barriers to Discharge: Continued Medical Work up, SNF Pending bed offer   Patient Goals and CMS Choice Patient states their goals for this hospitalization and ongoing recovery are:: get around   Choice offered to / list presented to : Patient (pt plans to return to [facility])      Expected Discharge Plan and Services In-house Referral: Clinical Social Work   Post Acute Care Choice: Skilled Nursing Facility Living arrangements for the past 2 months: Single Family Home                                      Prior Living Arrangements/Services Living arrangements for the past 2 months: Single Family Home Lives with:: Self Patient language and need for interpreter reviewed:: Yes Do you feel safe going back to the place where you live?: Yes      Need for Family Participation in Patient Care: Yes (Comment) Care giver support system in place?: Yes (comment) Current home services: Other (comment) (none) Criminal Activity/Legal Involvement Pertinent to Current Situation/Hospitalization: No - Comment as needed  Activities of Daily Living      Permission Sought/Granted Permission sought to share information with : Family Supports Permission granted to share information with : Yes, Verbal Permission Granted  Share Information with NAME: friend [name],  niece [name]  Permission granted to share info w AGENCY: SNF        Emotional Assessment Appearance:: Appears stated age Attitude/Demeanor/Rapport: Engaged Affect (typically observed): Appropriate, Pleasant Orientation: : Oriented to Self, Oriented to  Time, Oriented to Situation      Admission diagnosis:  ESRD (end stage renal disease) (HCC) [N18.6] Other fracture of right femur, initial encounter for closed fracture (HCC) [S72.8X1A] Fall, initial encounter [W19.XXXA] Closed fracture of right hip, initial encounter (HCC) [S72.001A] Patient Active Problem List   Diagnosis Date Noted   Protein-calorie malnutrition, severe 06/26/2024   Traumatic closed nondisplaced fracture of neck of right femur, initial encounter (HCC) 06/24/2024   Acute acalculous cholecystitis 05/25/2024   Hypertensive emergency 03/23/2024   Cognitive impairment 03/22/2024   Dry gangrene (HCC) 03/22/2024   HTN (hypertension) 09/13/2023   COVID-19 07/10/2023   Hyperkalemia 07/10/2023   Metabolic acidosis with increased anion gap and accumulation of organic acids 07/10/2023   Hypertensive urgency 07/10/2023   Hypoglycemia 07/10/2023   Weakness 07/10/2023   Syncope 07/05/2022   Syncope and collapse 07/04/2022   Type 2 diabetes mellitus (HCC) 07/04/2022   Anemia of chronic disease 07/04/2022   Acute metabolic encephalopathy due to hypoglycemia 12/30/2021   Hypothermia 12/30/2021   Uncontrolled type 2 diabetes mellitus with hypoglycemia, with long-term current use of insulin  (HCC) 12/30/2021   History of CVA (cerebrovascular accident) 12/30/2021   DNR (  do not resuscitate) 12/30/2021   Stroke determined by clinical assessment (HCC) 12/18/2021   ESRD (end stage renal disease) on dialysis (HCC) 02/23/2021   DOE (dyspnea on exertion) 11/22/2017   Hyperlipidemia LDL goal <70 04/17/2017   CAD (coronary artery disease)    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 09/10/2014   Diabetes mellitus without complication (HCC) 09/10/2014   Hypertension 09/10/2014   Emphysema lung (HCC) 09/10/2014   PCP:  Shelda Atlas, MD Pharmacy:   [facility] [address] [place] - [address] [place] [address] Phone: [phone] Fax: [phone]  [place] [facility] [address], [address] [place] [address] Phone: [phone] Fax: [phone]  [facility] [address] [place] [address] Phone: [phone] Fax: [phone]     Social Drivers of Health (SDOH) Social History: SDOH Screenings   Food Insecurity: No Food Insecurity (06/25/2024)  Housing: Low Risk (06/25/2024)  Transportation Needs: No Transportation Needs (06/25/2024)  Utilities: Not At Risk (06/25/2024)  Social Connections: Moderately Isolated (06/25/2024)  Tobacco Use: Medium Risk (06/25/2024)   SDOH Interventions:     Readmission Risk Interventions    07/07/2022    9:49 AM  Readmission Risk Prevention Plan  Transportation Screening Complete  PCP or Specialist Appt within 3-5 Days Complete  HRI or Home Care Consult Complete  Social Work Consult for Recovery Care Planning/Counseling --  Palliative Care Screening Not Applicable  Medication Review Oceanographer) Complete

## 2024-06-27 NOTE — Progress Notes (Signed)
 Subjective: 2 Days Post-Op Procedures (LRB): FIXATION, FEMUR, NECK, PERCUTANEOUS, USING SCREW (Right) Patient reports pain as mild.    Objective: Vital signs in last 24 hours: Temp:  [97.7 F (36.5 C)-98.7 F (37.1 C)] 98.2 F (36.8 C) (01/29 0732) Pulse Rate:  [64-98] 70 (01/29 0732) Resp:  [16-17] 16 (01/29 0732) BP: (178-191)/(68-76) 191/73 (01/29 0732) SpO2:  [92 %-98 %] 97 % (01/29 0732)  Intake/Output from previous day: 01/28 0701 - 01/29 0700 In: 480 [P.O.:480] Out: -  Intake/Output this shift: No intake/output data recorded.  Recent Labs    06/24/24 1447 06/24/24 1453 06/24/24 1454 06/25/24 1505 06/26/24 0614  HGB 10.6* 11.6* 11.9* 12.6* 10.7*   Recent Labs    06/24/24 1447 06/24/24 1453 06/24/24 2303 06/25/24 1505 06/26/24 0614  WBC 7.2  --   --   --  5.7  RBC 3.37*  --  3.39*  --  3.36*  HCT 33.2*   < >  --  37.0* 32.3*  PLT 242  --   --   --  221   < > = values in this interval not displayed.   Recent Labs    06/24/24 1447 06/24/24 1453 06/25/24 0522 06/25/24 1505  NA 139   < > 139 136  K 4.9   < > 5.2* 4.0  CL 98   < > 99 95*  CO2 25  --  26  --   BUN 51*   < > 58* 25*  CREATININE 7.83*   < > 8.97* 4.70*  GLUCOSE 139*   < > 143* 76  CALCIUM  9.7  --  9.7  --    < > = values in this interval not displayed.   Recent Labs    06/24/24 1503  INR 1.3*    Neurovascular intact Sensation intact distally Intact pulses distally Incision: dressing C/D/I No cellulitis present Compartment soft   Assessment/Plan: 2 Days Post-Op Procedures (LRB): FIXATION, FEMUR, NECK, PERCUTANEOUS, USING SCREW (Right) Up with therapy 25% WB RLE ABLA- mild and stable Norco rx on chart F/u with ortho two weeks post-op    Ronal LITTIE Grave 06/27/2024, 8:15 AM

## 2024-06-27 NOTE — Care Management Important Message (Signed)
 Important Message  Patient Details  Name: Shawn Collins MRN: [id number] Date of Birth: 02-01-1951   Important Message Given:  Yes - Medicare IM     [name] 06/27/2024, 4:40 PM

## 2024-06-28 ENCOUNTER — Other Ambulatory Visit (HOSPITAL_COMMUNITY): Payer: Self-pay

## 2024-06-28 MED ORDER — METHOCARBAMOL 500 MG PO TABS
500.0000 mg | ORAL_TABLET | Freq: Two times a day (BID) | ORAL | 0 refills | Status: AC | PRN
  Filled 2024-06-28: qty 14, 7d supply, fill #0

## 2024-06-28 MED ORDER — ACETAMINOPHEN 325 MG PO TABS
325.0000 mg | ORAL_TABLET | ORAL | 0 refills | Status: AC | PRN
  Filled 2024-06-28: qty 180, 15d supply, fill #0

## 2024-06-28 MED ORDER — IRBESARTAN 300 MG PO TABS
300.0000 mg | ORAL_TABLET | Freq: Every day | ORAL | 0 refills | Status: AC
  Filled 2024-06-28: qty 30, 30d supply, fill #0

## 2024-06-28 MED ORDER — AMLODIPINE BESYLATE 10 MG PO TABS
10.0000 mg | ORAL_TABLET | Freq: Every day | ORAL | 0 refills | Status: AC
  Filled 2024-06-28: qty 30, 30d supply, fill #0

## 2024-06-28 MED ORDER — ATORVASTATIN CALCIUM 40 MG PO TABS
40.0000 mg | ORAL_TABLET | Freq: Every day | ORAL | 0 refills | Status: AC
  Filled 2024-06-28: qty 30, 30d supply, fill #0

## 2024-06-28 MED ORDER — HYDRALAZINE HCL 100 MG PO TABS
100.0000 mg | ORAL_TABLET | Freq: Three times a day (TID) | ORAL | 0 refills | Status: AC
  Filled 2024-06-28: qty 90, 30d supply, fill #0

## 2024-06-28 MED ORDER — NITROGLYCERIN 0.4 MG SL SUBL
0.4000 mg | SUBLINGUAL_TABLET | SUBLINGUAL | 12 refills | Status: AC | PRN
  Filled 2024-06-28: qty 25, 7d supply, fill #0

## 2024-06-28 MED ORDER — HYDRALAZINE HCL 25 MG PO TABS
25.0000 mg | ORAL_TABLET | Freq: Once | ORAL | Status: AC
  Administered 2024-06-28: 25 mg via ORAL
  Filled 2024-06-28: qty 1

## 2024-06-28 MED ORDER — HYDROMORPHONE HCL 2 MG PO TABS
2.0000 mg | ORAL_TABLET | ORAL | Status: DC | PRN
  Administered 2024-07-01 – 2024-07-02 (×3): 2 mg via ORAL
  Filled 2024-06-28 (×3): qty 1

## 2024-06-28 MED ORDER — DOXERCALCIFEROL 4 MCG/2ML IV SOLN
INTRAVENOUS | Status: AC
  Filled 2024-06-28: qty 2

## 2024-06-28 MED ORDER — POLYETHYLENE GLYCOL 3350 17 GM/SCOOP PO POWD
17.0000 g | Freq: Every day | ORAL | 0 refills | Status: AC | PRN
  Filled 2024-06-28: qty 238, 14d supply, fill #0

## 2024-06-28 MED ORDER — LACTULOSE 10 GM/15ML PO SOLN
30.0000 g | Freq: Every day | ORAL | 0 refills | Status: AC | PRN
  Filled 2024-06-28: qty 237, 5d supply, fill #0

## 2024-06-28 MED ORDER — MELATONIN 5 MG PO TABS
5.0000 mg | ORAL_TABLET | Freq: Every day | ORAL | 0 refills | Status: AC
  Filled 2024-06-28: qty 30, 30d supply, fill #0

## 2024-06-28 MED ORDER — DOCUSATE SODIUM 100 MG PO CAPS
100.0000 mg | ORAL_CAPSULE | Freq: Two times a day (BID) | ORAL | 0 refills | Status: AC
  Filled 2024-06-28: qty 10, 5d supply, fill #0

## 2024-06-28 MED ORDER — HYDROMORPHONE HCL 2 MG PO TABS
2.0000 mg | ORAL_TABLET | ORAL | 0 refills | Status: DC | PRN
  Filled 2024-06-28: qty 30, 5d supply, fill #0

## 2024-06-28 MED ORDER — HYDROMORPHONE HCL 2 MG PO TABS: 2.0000 mg | ORAL_TABLET | ORAL | 0 refills | Status: AC | PRN

## 2024-06-28 MED ORDER — SEVELAMER CARBONATE 800 MG PO TABS
800.0000 mg | ORAL_TABLET | Freq: Two times a day (BID) | ORAL | 0 refills | Status: AC
  Filled 2024-06-28: qty 60, 30d supply, fill #0

## 2024-06-28 MED ORDER — CERTAVITE/ANTIOXIDANTS PO TABS
1.0000 | ORAL_TABLET | Freq: Every day | ORAL | 0 refills | Status: AC
  Filled 2024-06-28: qty 30, 30d supply, fill #0

## 2024-06-28 MED ORDER — BISACODYL 10 MG RE SUPP
10.0000 mg | Freq: Every day | RECTAL | 0 refills | Status: AC | PRN
  Filled 2024-06-28: qty 12, 12d supply, fill #0

## 2024-06-28 NOTE — Progress Notes (Signed)
 RN called and gave Shawn Collins at [facility] and she stated understanding. RN will remove IV when he returns from Dialysis and will call PTAR [phone]. Medications

## 2024-06-28 NOTE — Progress Notes (Signed)
" °   06/28/24 1958  Vitals  Temp 98.4 F (36.9 C)  Pulse Rate 73  Resp 12  BP (!) 196/72  SpO2 100 %  O2 Device Nasal Cannula  Weight 56.6 kg  Type of Weight Post-Dialysis  Oxygen Therapy  O2 Flow Rate (L/min) 2 L/min  Patient Activity (if Appropriate) In bed  Oximetry Probe Site Changed No  Post Treatment  Dialyzer Clearance Clear  Liters Processed 72  Fluid Removed (mL) 2000 mL  Tolerated HD Treatment Yes  Post-Hemodialysis Comments Patient tolerated HD well. No issues with LFA access. Hectrol 1 mg given as ordered. No distress noted post HD.  AVG/AVF Arterial Site Held (minutes) 10 minutes  AVG/AVF Venous Site Held (minutes) 10 minutes    "

## 2024-06-28 NOTE — Progress Notes (Signed)
 Pt to d/c to snf today. Contacted [facility] to be advised of pt's d/c today back to snf and that pt should resume care on Monday.   Randine Mungo Dialysis Navigator [phone]

## 2024-06-28 NOTE — Discharge Planning (Signed)
 [facility] Patient Discharge Orders- [facility] CLINIC: [facility]  Patient's name: Gregorey Nabor Admit/DC Dates: 06/24/2024 - 06/28/24  Discharge Diagnoses: R femoral neck fracture s/p  internal fixation  SERD  Aranesp : Given: no   Date and amount of last dose: n/a  Last Hgb: 10.7 PRBC's Given: no Date/# of units: n/a ESA dose for discharge: same dose IV Iron dose at discharge: same dose  Heparin  change: no  EDW Change: no New EDW:   [place]ath Change: no  Access intervention/Change: no Details:  Hectorol /Calcitriol change: no  Discharge Labs: Calcium  9.7 Phosphorus 4.5 Albumin  4.5 K+ 4.0  IV Antibiotics: no Details:  On Coumadin?: no Last INR: Next INR: Managed By:   OTHER/APPTS/LAB ORDERS:    D/C Meds to be reconciled by nurse after every discharge.  Completed By: Lucie Collet, PA-C 06/28/2024, 12:29 PM  [facility] Pager: [phone]   Reviewed by: MD:______ RN_______

## 2024-06-28 NOTE — TOC Transition Note (Signed)
 Transition of Care [facility]) - Discharge Note   Patient Details  Name: Shawn Collins MRN: [id number] Date of Birth: August 14, 1950  Transition of Care [facility]) CM/SW Contact:  Bridget Cordella Simmonds, LCSW Phone Number: 06/28/2024, 1:29 PM   Clinical Narrative:   Pt discharging to [facility].  RN call report to [phone].    Pt is on will call status with ptar for transportation after HD. Please call [phone] when he is ready for transport.    Final next level of care: Skilled Nursing Facility Barriers to Discharge: Barriers Resolved   Patient Goals and CMS Choice Patient states their goals for this hospitalization and ongoing recovery are:: get around   Choice offered to / list presented to : Patient (pt plans to return to [facility])      Discharge Placement              Patient chooses bed at: [facility] Patient to be transferred to facility by: ptar Name of family member notified: friend [name], niece [name] Patient and family notified of of transfer: 06/28/24  Discharge Plan and Services Additional resources added to the After Visit Summary for   In-house Referral: Clinical Social Work   Post Acute Care Choice: Skilled Nursing Facility                               Social Drivers of Health (SDOH) Interventions SDOH Screenings   Food Insecurity: No Food Insecurity (06/25/2024)  Housing: Low Risk (06/25/2024)  Transportation Needs: No Transportation Needs (06/25/2024)  Utilities: Not At Risk (06/25/2024)  Social Connections: Moderately Isolated (06/25/2024)  Tobacco Use: Medium Risk (06/25/2024)     Readmission Risk Interventions    07/07/2022    9:49 AM  Readmission Risk Prevention Plan  Transportation Screening Complete  PCP or Specialist Appt within 3-5 Days Complete  HRI or Home Care Consult Complete  Social Work Consult for Recovery Care Planning/Counseling --  Palliative Care Screening Not Applicable  Medication Review Oceanographer)  Complete

## 2024-06-28 NOTE — Progress Notes (Addendum)
 Physical Therapy Treatment Patient Details Name: Shawn Collins MRN: [id number] DOB: 04/21/1951 Today's Date: 06/28/2024   History of Present Illness Pt is a 74 year old man admitted on 06/25/24 after a fall at [facility] resulting in R femoral neck fx. Underwent ORIF 06/25/24. PMH: recent admisstion with weakness and hypoglycemia, discharge to SNF, ESRD, CAD, COPD, CVA, DM2, HLD, HTN, low vision.    PT Comments  Pt received in supine, lethargic but awakens to voice and with fair participation in bed-level session. Pt benefits from max multimodal cues this date due to continued lethargy despite repositioning to bed chair posture (with legs still extended) and supine HEP. Handout given to reinforce his RLE TWB (up to 25%) WB status per ortho and board updated on this to ensure continuity of care. PTA defer OOB mobility due to pt elevated BP in AM, plan for HD soon prior to DC and pt lethargy. Patient will benefit from continued inpatient follow up therapy, <3 hours/day.    If plan is discharge home, recommend the following: Supervision due to cognitive status;A lot of help with bathing/dressing/bathroom;Two people to help with walking and/or transfers;Help with stairs or ramp for entrance;Assist for transportation;Direct supervision/assist for financial management;Direct supervision/assist for medications management;Assistance with cooking/housework (appears more confused today than baseline, at current level rec +2 for OOB)   Can travel by private vehicle     No  Equipment Recommendations  Rolling walker (2 wheels);BSC/3in1 (consider WC pending progress and WB status resolution; drop arm on BSC at current LOF)    Recommendations for Other Services       Precautions / Restrictions Precautions Precautions: Fall Recall of Precautions/Restrictions: Impaired Precaution/Restrictions Comments: BP very elevated Restrictions Weight Bearing Restrictions Per Provider Order: Yes RLE Weight Bearing  Per Provider Order: Touchdown weight bearing Other Position/Activity Restrictions: pt with poor recall of RLE precs, board updated, pt given handout to reinforce     Mobility  Bed Mobility Overal bed mobility: Needs Assistance Bed Mobility: Supine to Sit     Supine to sit: Mod assist     General bed mobility comments: decreased ability to sequence with multimodal cues, assist to support R LE and raise trunk; HHA and pt using bed rail. To long sitting x3 but pt remains drowsy so defer EOB/OOB today.    Transfers Overall transfer level: Needs assistance                 General transfer comment: PTA defer due to pt lethargy and MD arriving to speak with him about HD plans, also pt very hypertensive prior to session.    Ambulation/Gait               General Gait Details: PTA defer, pt lethargic/falling asleep between exercises in supine   Stairs             Wheelchair Mobility     Tilt Bed    Modified Rankin (Stroke Patients Only)       Balance Overall balance assessment: Needs assistance Sitting-balance support: Single extremity supported, Feet unsupported Sitting balance-Leahy Scale: Poor Sitting balance - Comments: likely due to lethargy       Standing balance comment: PTA defer due to lethargy                            Communication Communication Communication: No apparent difficulties  Cognition Arousal: Lethargic Behavior During Therapy: WFL for tasks assessed/performed   PT - Cognitive impairments:  Awareness, Sequencing, No family/caregiver present to determine baseline, Memory, Attention, Problem solving, Safety/Judgement                       PT - Cognition Comments: Pt drowsy, awakens to cues, no recall of his RLE WB status. Pt needs multimodal cues  for simple motor commands during supine HEP and bed mobility, pt falling asleep between activities, needs max cues for attention to task. SpO2/HR WFL, BP elevated  prior to session, RN aware. Board updated to include pt WB restrictions as nursing staff had been charting it incorrectly. Following commands: Impaired Following commands impaired: Follows one step commands inconsistently, Follows one step commands with increased time    Cueing Cueing Techniques: Verbal cues, Visual cues, Gestural cues, Tactile cues  Exercises Total Joint Exercises Ankle Circles/Pumps: AROM, AAROM, Both, 15 reps, Supine Quad Sets: AROM, AAROM, Both, 10 reps, Supine (multimodal/tactile cues under knees for proper technique; cues to hold/count and rest between reps) Short Arc Quad: AROM, AAROM, Both, 10 reps, Supine (dense cues; some AA to encourage him to hold 3-5 seconds) Hip ABduction/ADduction: AAROM, Both, 5 reps, Supine Long Arc Quad:  (visual/verbal demo, defer today due to elevated BP and pt lethargy) Other Exercises Other Exercises: HEP link: Visit    [email or web address]  Access [email or web address]: [email or web address])    General Comments General comments (skin integrity, edema, etc.): SpO2 99% on RA, HR 60's bpm per tele monitor; bed placed in full upright posture to promote improved pulmonary clearance and pt alertness and knee position locked to prevent pt from flexing knees at rest. Pink foam under RLE for elevation and to float R heel; blood on RUE inner elbow dressing (assume where IV recently removed), needs new dressing placed/down due to blood on gown (does not appear to be draining very much outside dressing but unsure if bleeding actually fully stopped or not.      Pertinent Vitals/Pain Pain Assessment Pain Assessment: Faces Faces Pain Scale: Hurts a little bit Pain Location: RLE Pain Descriptors / Indicators: Discomfort, Operative site guarding Pain Intervention(s): Limited activity within patient's tolerance, Monitored during session, Repositioned    Home Living                          Prior Function            PT Goals (current goals can now  be found in the care plan section) Acute Rehab PT Goals Patient Stated Goal: To get better PT Goal Formulation: With patient Time For Goal Achievement: 07/10/24 Progress towards PT goals: Progressing toward goals (slowly)    Frequency    Min 3X/week      PT Plan      Co-evaluation              AM-PAC PT 6 Clicks Mobility   Outcome Measure  Help needed turning from your back to your side while in a flat bed without using bedrails?: A Lot Help needed moving from lying on your back to sitting on the side of a flat bed without using bedrails?: A Lot Help needed moving to and from a bed to a chair (including a wheelchair)?: A Lot Help needed standing up from a chair using your arms (e.g., wheelchair or bedside chair)?: A Lot Help needed to walk in hospital room?: Total (too lethargic today) Help needed climbing 3-5 steps with a railing? : Total 6 Click Score: 10    End of Session  Equipment Utilized During Treatment: Other (comment) (bed pad assist) Activity Tolerance: Patient limited by lethargy Patient left: with call bell/phone within reach;in bed;with bed alarm set;Other (comment) (bed in chair posture, RLE elevated on foam, knees straight; MD arriving to room) Nurse Communication: Mobility status;Precautions;Need for lift equipment;Weight bearing status;Other (comment) (WB status updated on board and RN notified via [place] because it was charted incorrectly in Epic overnight by nursing staff; rec +2 hoyer vs scoot OOB to BSC/chair if OOB) PT Visit Diagnosis: Unsteadiness on feet (R26.81);Other abnormalities of gait and mobility (R26.89);Difficulty in walking, not elsewhere classified (R26.2)     Time: 1050-1107 PT Time Calculation (min) (ACUTE ONLY): 17 min  Charges:    $Therapeutic Exercise: 8-22 mins PT General Charges $$ ACUTE PT VISIT: 1 Visit                     Neema Fluegge P., PTA Acute Rehabilitation Services Secure Chat Preferred 9a-5:30pm Office: [phone]     Connell HERO [facility] 06/28/2024, 11:30 AM

## 2024-06-28 NOTE — Progress Notes (Signed)
 " PROGRESS NOTE    Shawn Collins  FMW:[id number] DOB: 07-09-1950 DOA: 06/24/2024 PCP: Shelda Atlas, MD  Subjective: Patient was seen on the floor.  No complaints.  Ready for rehab.    Hospital Course: No notes on file   Assessment and Plan:  Right hip fracture      -- He is postop day 3.  Pain is controlled.   2.   End-stage renal disease      -Electrolytes and volume status are adequate.  Ionized calcium  slightly low.  No bleeding.check renal panel.  3.   Question of pulmonary embolism as a cause of syncope       - Nuclear medicine perfusion scan was low probability.  SpO2 98% on 2 L     DVT prophylaxis: SCDs Start: 06/25/24 1757 Place TED hose Start: 06/25/24 1757 Place and maintain sequential compression device Start: 06/24/24 1843 SCDs Start: 06/24/24 1832     Code Status: Do not attempt resuscitation (DNR) PRE-ARREST INTERVENTIONS DESIRED Family Communication: None available Disposition Plan: Hopefully will be able to return to [facility]. Reason for continuing need for hospitalization: Postoperative monitoring/need for hemodialysis in an inpatient setting due to fresh hip fracture repair.  Objective: Vitals:   06/28/24 0136 06/28/24 0229 06/28/24 0522 06/28/24 0740  BP: (!) 205/74 (!) 201/75 (!) 198/80 (!) 190/75  Pulse:    68  Resp:    16  Temp:    98.7 F (37.1 C)  TempSrc:      SpO2:    100%  Weight:      Height:        Intake/Output Summary (Last 24 hours) at 06/28/2024 0942 Last data filed at 06/27/2024 1817 Gross per 24 hour  Intake 100 ml  Output 1700 ml  Net -1600 ml   Filed Weights   06/25/24 1510 06/27/24 0819 06/27/24 1303  Weight: 56.7 kg 59.3 kg 57.7 kg    Examination:  Chronically ill male in the dialysis unit, in the bed. Currently looks comfortable.  Work of breathing is normal HEENT: PERRLA  EOMI no scleral icterus CV: Regular rate and rhythm S1-S2 no murmurs rubs gallops Lungs: Clear Abdomen: Soft, bowel sounds present  extremities Extremities: Both lower extremities without edema and neurovascularly intact, dressing over right lateral hip is clean dry and intact Neurologic: He is awake alert coherent can move his upper extremities and his left lower extremity.  Right lower extremity movement is slightly impaired due to surgery   Data Reviewed: I have personally reviewed following labs and imaging studies  CBC: Recent Labs  Lab 06/24/24 1447 06/24/24 1453 06/24/24 1454 06/25/24 1505 06/26/24 0614  WBC 7.2  --   --   --  5.7  NEUTROABS 6.1  --   --   --   --   HGB 10.6* 11.6* 11.9* 12.6* 10.7*  HCT 33.2* 34.0* 35.0* 37.0* 32.3*  MCV 98.5  --   --   --  96.1  PLT 242  --   --   --  221   Basic Metabolic Panel: Recent Labs  Lab 06/24/24 1447 06/24/24 1453 06/24/24 1454 06/25/24 0522 06/25/24 1505  NA 139 140 140 139 136  K 4.9 4.3 4.3 5.2* 4.0  CL 98 101  --  99 95*  CO2 25  --   --  26  --   GLUCOSE 139* 140*  --  143* 76  BUN 51* 49*  --  58* 25*  CREATININE 7.83* 8.90*  --  8.97* 4.70*  CALCIUM  9.7  --   --  9.7  --   PHOS  --   --   --  4.5  --    GFR: Estimated Creatinine Clearance: 11.1 mL/min (A) (by C-G formula based on SCr of 4.7 mg/dL (H)). Liver Function Tests: Recent Labs  Lab 06/24/24 1447 06/25/24 0522  AST 37  --   ALT 15  --   ALKPHOS 88  --   BILITOT 0.6  --   PROT 7.5  --   ALBUMIN  4.3 4.3   Recent Labs  Lab 06/24/24 1447  LIPASE 27   No results for input(s): AMMONIA in the last 168 hours. Coagulation Profile: Recent Labs  Lab 06/24/24 1503  INR 1.3*   Cardiac Enzymes: No results for input(s): CKTOTAL, CKMB, CKMBINDEX, TROPONINI in the last 168 hours. ProBNP, BNP (last 5 results) Recent Labs    07/10/23 1302 06/24/24 2303  PROBNP  --  17,514.0*  BNP >4,500.0*  --    HbA1C: No results for input(s): HGBA1C in the last 72 hours. CBG: Recent Labs  Lab 06/25/24 1702  GLUCAP 73   Lipid Profile: No results for input(s): CHOL,  HDL, LDLCALC, TRIG, CHOLHDL, LDLDIRECT in the last 72 hours. Thyroid  Function Tests: No results for input(s): TSH, T4TOTAL, FREET4, T3FREE, THYROIDAB in the last 72 hours.  Anemia Panel: No results for input(s): VITAMINB12, FOLATE, FERRITIN, TIBC, IRON, RETICCTPCT in the last 72 hours.  Sepsis Labs: Recent Labs  Lab 06/24/24 1454 06/24/24 2316  LATICACIDVEN 0.8 1.0    Recent Results (from the past 240 hours)  Surgical pcr screen     Status: Abnormal   Collection Time: 06/25/24  3:10 PM   Specimen: Nasal Mucosa; Nasal Swab  Result Value Ref Range Status   MRSA, PCR POSITIVE (A) NEGATIVE Final    Comment: RESULT CALLED TO, READ BACK BY AND VERIFIED WITH: RN Bernarda DASEN on [phone] @1825  by SM    Staphylococcus aureus POSITIVE (A) NEGATIVE Final    Comment: (NOTE) The Xpert SA Assay (FDA approved for NASAL specimens in patients 49 years of age and older), is one component of a comprehensive surveillance program. It is not intended to diagnose infection nor to guide or monitor treatment. Performed at [facility], [address], [place], [place] [address]      Radiology Studies: ECHOCARDIOGRAM COMPLETE Result Date: 06/26/2024    ECHOCARDIOGRAM REPORT   Patient Name:   Shawn Collins Date of Exam: 06/26/2024 Medical Rec #:  [id number]     Height:       70.0 in Accession #:    [id number]    Weight:       125.0 lb Date of Birth:  Apr 28, 1951     BSA:          1.709 m Patient Age:    73 years      BP:           178/74 mmHg Patient Gender: M             HR:           69 bpm. Exam Location:  Inpatient Procedure: 2D Echo, Cardiac Doppler and Color Doppler (Both Spectral and Color            Flow Doppler were utilized during procedure). Indications:    Syncope R55  History:        Patient has prior history of Echocardiogram examinations, most  recent 07/25/2022. Signs/Symptoms:Hypertensive Heart Disease.  Sonographer:    Nathanel Devonshire Referring Phys:  [phone] NAIPING M XU IMPRESSIONS  1. Left ventricular ejection fraction, by estimation, is 60 to 65%. The left ventricle has normal function. The left ventricle has no regional wall motion abnormalities. There is mild concentric left ventricular hypertrophy. Left ventricular diastolic parameters are consistent with Grade II diastolic dysfunction (pseudonormalization).  2. Right ventricular systolic function is mildly reduced. The right ventricular size is moderately enlarged. There is severely elevated pulmonary artery systolic pressure. The estimated right ventricular systolic pressure is 83.9 mmHg.  3. Left atrial size was mild to moderately dilated.  4. Right atrial size was mild to moderately dilated.  5. The mitral valve is abnormal. Moderate mitral valve regurgitation. No evidence of mitral stenosis. Moderate mitral annular calcification.  6. Tricuspid valve regurgitation is moderate to severe.  7. The aortic valve is tricuspid. There is severe calcifcation of the aortic valve. Aortic valve regurgitation is not visualized. Mild aortic valve stenosis. Aortic valve area, by VTI measures 1.70 cm. Aortic valve mean gradient measures 9.0 mmHg. Aortic valve Vmax measures 2.05 m/s.  8. The inferior vena cava is normal in size with <50% respiratory variability, suggesting right atrial pressure of 8 mmHg. FINDINGS  Left Ventricle: Left ventricular ejection fraction, by estimation, is 60 to 65%. The left ventricle has normal function. The left ventricle has no regional wall motion abnormalities. The left ventricular internal cavity size was normal in size. There is  mild concentric left ventricular hypertrophy. Left ventricular diastolic parameters are consistent with Grade II diastolic dysfunction (pseudonormalization). Right Ventricle: The right ventricular size is moderately enlarged. No increase in right ventricular wall thickness. Right ventricular systolic function is mildly reduced. There is severely elevated  pulmonary artery systolic pressure. The tricuspid regurgitant velocity is 4.15 m/s, and with an assumed right atrial pressure of 15 mmHg, the estimated right ventricular systolic pressure is 83.9 mmHg. Left Atrium: Left atrial size was mild to moderately dilated. Right Atrium: Right atrial size was mild to moderately dilated. Pericardium: There is no evidence of pericardial effusion. Mitral Valve: The mitral valve is abnormal. There is moderate thickening of the mitral valve leaflet(s). There is moderate calcification of the mitral valve leaflet(s). Moderate mitral annular calcification. Moderate mitral valve regurgitation. No evidence of mitral valve stenosis. MV peak gradient, 8.4 mmHg. The mean mitral valve gradient is 3.0 mmHg. Tricuspid Valve: The tricuspid valve is normal in structure. Tricuspid valve regurgitation is moderate to severe. No evidence of tricuspid stenosis. Aortic Valve: The aortic valve is tricuspid. There is severe calcifcation of the aortic valve. Aortic valve regurgitation is not visualized. Mild aortic stenosis is present. Aortic valve mean gradient measures 9.0 mmHg. Aortic valve peak gradient measures 16.8 mmHg. Aortic valve area, by VTI measures 1.70 cm. Pulmonic Valve: The pulmonic valve was normal in structure. Pulmonic valve regurgitation is not visualized. No evidence of pulmonic stenosis. Aorta: The aortic root is normal in size and structure. Venous: The inferior vena cava is normal in size with less than 50% respiratory variability, suggesting right atrial pressure of 8 mmHg. IAS/Shunts: No atrial level shunt detected by color flow Doppler.  LEFT VENTRICLE PLAX 2D LVIDd:         4.60 cm     Diastology LVIDs:         2.90 cm     LV e' medial:    3.81 cm/s LV PW:         0.90 cm  LV E/e' medial:  40.4 LV IVS:        1.00 cm     LV e' lateral:   7.10 cm/s LVOT diam:     1.90 cm     LV E/e' lateral: 21.7 LV SV:         73 LV SV Index:   43 LVOT Area:     2.84 cm LV IVRT:       56  msec  LV Volumes (MOD) LV vol d, MOD A2C: 96.1 ml LV vol d, MOD A4C: 64.7 ml LV vol s, MOD A2C: 26.8 ml LV vol s, MOD A4C: 21.6 ml LV SV MOD A2C:     69.3 ml LV SV MOD A4C:     64.7 ml LV SV MOD BP:      57.8 ml RIGHT VENTRICLE          IVC RV Basal diam:  3.20 cm  IVC diam: 1.90 cm TAPSE (M-mode): 1.6 cm                          PULMONARY VEINS                          Diastolic Velocity: 53.50 cm/s                          S/D Velocity:       0.60                          Systolic Velocity:  31.40 cm/s LEFT ATRIUM             Index LA diam:        3.80 cm 2.22 cm/m LA Vol (A2C):   64.5 ml 37.73 ml/m LA Vol (A4C):   43.1 ml 25.21 ml/m LA Biplane Vol: 53.4 ml 31.24 ml/m  AORTIC VALVE                     PULMONIC VALVE AV Area (Vmax):    1.66 cm      PV Vmax:       1.18 m/s AV Area (Vmean):   1.58 cm      PV Peak grad:  5.6 mmHg AV Area (VTI):     1.70 cm AV Vmax:           205.00 cm/s AV Vmean:          132.000 cm/s AV VTI:            0.428 m AV Peak Grad:      16.8 mmHg AV Mean Grad:      9.0 mmHg LVOT Vmax:         120.00 cm/s LVOT Vmean:        73.400 cm/s LVOT VTI:          0.257 m LVOT/AV VTI ratio: 0.60  AORTA Ao Root diam: 3.00 cm Ao Asc diam:  2.70 cm MITRAL VALVE                TRICUSPID VALVE MV Area (PHT): 4.60 cm     TR Peak grad:   68.9 mmHg MV Area VTI:   1.86 cm     TR Vmax:        415.00 cm/s MV Peak grad:  8.4 mmHg MV Mean grad:  3.0  mmHg     SHUNTS MV Vmax:       1.45 m/s     Systemic VTI:  0.26 m MV Vmean:      78.1 cm/s    Systemic Diam: 1.90 cm MV Decel Time: 165 msec MV E velocity: 154.00 cm/s MV A velocity: 84.60 cm/s MV E/A ratio:  1.82 Toribio Fuel MD Electronically signed by Toribio Fuel MD Signature Date/Time: 06/26/2024/3:48:41 PM    Final     Scheduled Meds:  Chlorhexidine  Gluconate Cloth  6 each Topical Q0600   Chlorhexidine  Gluconate Cloth  6 each Topical Daily   clopidogrel   75 mg Oral Daily   docusate sodium   100 mg Oral BID   doxercalciferol   1 mcg  Intravenous Q M,W,F-HD   feeding supplement (NEPRO CARB STEADY)  237 mL Oral BID BM   irbesartan   300 mg Oral Daily   multivitamin  1 tablet Oral QHS   mupirocin  ointment  1 Application Nasal BID   sevelamer  carbonate  1,600 mg Oral TID WC   And   sevelamer  carbonate  800 mg Oral With snacks   Continuous Infusions:  sodium chloride  75 mL/hr at 06/25/24 1820     LOS: 4 days   Time spent: 25 minutes  Lonni KANDICE Moose, MD  [facility]  06/28/2024, 9:42 AM   "

## 2024-06-28 NOTE — TOC Progression Note (Addendum)
 Transition of Care [facility]) - Progression Note    Patient Details  Name: Shawn Collins MRN: [id number] Date of Birth: 10-22-1950  [facility] of [facility]) CM/SW Contact  Bridget Cordella Simmonds, LCSW Phone Number: 06/28/2024, 10:32 AM  Clinical Narrative:   CSW confirmed with T[facility]: they can receive pt today.  CSW reached out to Tracy/[facility] regarding outpt HD.   Pt needs HD today prior to DC.  [facility] informed.  DC summary sent. [facility] aware of HD [facility], MWF 1105 chair time.   Medicare payer with inpt order 06/24/24.  Expected Discharge Plan: Skilled Nursing Facility Barriers to Discharge: Continued Medical Work up, SNF Pending bed offer               Expected Discharge Plan and Services In-house Referral: Clinical Social Work   Post Acute Care Choice: Skilled Nursing Facility Living arrangements for the past 2 months: Single Family Home Expected Discharge Date: 06/28/24                                     Social Drivers of Health (SDOH) Interventions SDOH Screenings   Food Insecurity: No Food Insecurity (06/25/2024)  Housing: Low Risk (06/25/2024)  Transportation Needs: No Transportation Needs (06/25/2024)  Utilities: Not At Risk (06/25/2024)  Social Connections: Moderately Isolated (06/25/2024)  Tobacco Use: Medium Risk (06/25/2024)    Readmission Risk Interventions    07/07/2022    9:49 AM  Readmission Risk Prevention Plan  Transportation Screening Complete  PCP or Specialist Appt within 3-5 Days Complete  HRI or Home Care Consult Complete  Social Work Consult for Recovery Care Planning/Counseling --  Palliative Care Screening Not Applicable  Medication Review Oceanographer) Complete

## 2024-06-29 MED ORDER — LABETALOL HCL 5 MG/ML IV SOLN
10.0000 mg | INTRAVENOUS | Status: AC | PRN
  Administered 2024-06-29 (×2): 10 mg via INTRAVENOUS
  Filled 2024-06-29 (×2): qty 4

## 2024-06-29 MED ORDER — HYDRALAZINE HCL 50 MG PO TABS
100.0000 mg | ORAL_TABLET | Freq: Three times a day (TID) | ORAL | Status: DC
  Administered 2024-06-29 – 2024-07-02 (×10): 100 mg via ORAL
  Filled 2024-06-29 (×10): qty 2

## 2024-06-29 MED ORDER — AMLODIPINE BESYLATE 10 MG PO TABS
10.0000 mg | ORAL_TABLET | Freq: Every day | ORAL | Status: DC
  Administered 2024-06-29 – 2024-07-02 (×4): 10 mg via ORAL
  Filled 2024-06-29 (×4): qty 1

## 2024-06-29 NOTE — Progress Notes (Signed)
 Patient unable to discharge to [facility] at this time. Patient's blood pressure was 206/76 automatic and 198/71 manually when EMT arrived to transport patient. Patient was given a one time dose of PO hydralazine  prior due to his blood pressure of 196/77. By midnight blood pressure decreased to 189/72. Upon EMT leaving RN was instructed to place a new IV and administer IV hydralazine . Prior to giving blood pressure was 198/71 manually. At 0545 blood pressure was 195/76 and hospitalist ordered labetalol  to be given. Recheck was 177/74.

## 2024-06-29 NOTE — TOC Progression Note (Signed)
 Transition of Care [facility]) - Progression Note    Patient Details  Name: Shawn Collins MRN: [id number] Date of Birth: 1951/02/12  Transition of [facility]) CM/SW Contact  Luise JAYSON Pan, [place] Phone Number: 06/29/2024, 9:10 AM  Clinical Narrative:   CSW updated facility that patient is not medically stable at this time. DC on hold due to overnight event, see NP's note.   Expected Discharge Plan: Skilled Nursing Facility Barriers to Discharge: Continued Medical Work up               Expected Discharge Plan and Services In-house Referral: Clinical Social Work   Post Acute Care Choice: Skilled Nursing Facility Living arrangements for the past 2 months: Single Family Home Expected Discharge Date: 06/28/24                                     Social Drivers of Health (SDOH) Interventions SDOH Screenings   Food Insecurity: No Food Insecurity (06/25/2024)  Housing: Low Risk (06/25/2024)  Transportation Needs: No Transportation Needs (06/25/2024)  Utilities: Not At Risk (06/25/2024)  Social Connections: Moderately Isolated (06/25/2024)  Tobacco Use: Medium Risk (06/25/2024)    Readmission Risk Interventions    07/07/2022    9:49 AM  Readmission Risk Prevention Plan  Transportation Screening Complete  PCP or Specialist Appt within 3-5 Days Complete  HRI or Home Care Consult Complete  Social Work Consult for Recovery Care Planning/Counseling --  Palliative Care Screening Not Applicable  Medication Review Oceanographer) Complete

## 2024-06-29 NOTE — Progress Notes (Signed)
" ° ° ° °  Patient Name: Shawn Collins           DOB: 1951/04/30  MRN: [id number]      Admission Date: 06/24/2024  Attending Provider: Maranda Lonni MATSU, MD  Primary Diagnosis: Traumatic closed nondisplaced fracture of neck of right femur, initial encounter (HCC)   Level of care: Telemetry   OVERNIGHT EVENT  Notified by bedside RN that patient was scheduled for discharge tonight and transportation is at the bedside. EMTs were unable to transport the patient due to significant hypertension, with SBP ranging from 198-210 mmHg. The patient has remained hypertensive since dialysis. Per EMT transport, SBP must be <190 mmHg for transport unless written medical clearance is provided.  Discharge is currently on hold. Nursing staff to obtain IV access and administer IV hydralazine  for blood pressure management.    Brindley Madarang, DNP, ACNPC- AG [facility] [place]    "

## 2024-06-29 NOTE — Progress Notes (Signed)
 " [facility] Progress Note   Subjective:   Seen in room. Completed dialysis yesterday with 2L UF. Discharge held due to HTN.   Objective Vitals:   06/29/24 0545 06/29/24 0700 06/29/24 0733 06/29/24 0921  BP: (!) 195/76 (!) 177/74 (!) 163/65 (!) 201/69  Pulse:   66 66  Resp:   18 18  Temp:   97.6 F (36.4 C) 98 F (36.7 C)  TempSrc:      SpO2:   100% 100%  Weight:      Height:       Physical Exam General: Sleeping, nad Heart: RRR  Lungs: Clear, normal wob Abdomen: non -tender  Extremities: no LE edema  Dialysis Access: AVG   Additional Objective Labs: Basic Metabolic Panel: Recent Labs  Lab 06/24/24 1447 06/24/24 1453 06/24/24 1454 06/25/24 0522 06/25/24 1505  NA 139 140 140 139 136  K 4.9 4.3 4.3 5.2* 4.0  CL 98 101  --  99 95*  CO2 25  --   --  26  --   GLUCOSE 139* 140*  --  143* 76  BUN 51* 49*  --  58* 25*  CREATININE 7.83* 8.90*  --  8.97* 4.70*  CALCIUM  9.7  --   --  9.7  --   PHOS  --   --   --  4.5  --    Liver Function Tests: Recent Labs  Lab 06/24/24 1447 06/25/24 0522  AST 37  --   ALT 15  --   ALKPHOS 88  --   BILITOT 0.6  --   PROT 7.5  --   ALBUMIN  4.3 4.3   Recent Labs  Lab 06/24/24 1447  LIPASE 27   CBC: Recent Labs  Lab 06/24/24 1447 06/24/24 1453 06/24/24 1454 06/25/24 1505 06/26/24 0614  WBC 7.2  --   --   --  5.7  NEUTROABS 6.1  --   --   --   --   HGB 10.6*   < > 11.9* 12.6* 10.7*  HCT 33.2*   < > 35.0* 37.0* 32.3*  MCV 98.5  --   --   --  96.1  PLT 242  --   --   --  221   < > = values in this interval not displayed.   Blood Culture    Component Value Date/Time   SDES BLOOD RIGHT ARM 05/26/2024 1130   SDES BLOOD RIGHT HAND 05/26/2024 1130   SPECREQUEST  05/26/2024 1130    BOTTLES DRAWN AEROBIC AND ANAEROBIC Blood Culture adequate volume   SPECREQUEST  05/26/2024 1130    BOTTLES DRAWN AEROBIC ONLY Blood Culture results may not be optimal due to an inadequate volume of blood received in culture  bottles   CULT  05/26/2024 1130    NO GROWTH 5 DAYS Performed at [facility], [address], [place], [place] [address]    CULT  05/26/2024 1130    NO GROWTH 5 DAYS Performed at [facility], [address] [place], [place] [address]    REPTSTATUS 05/31/2024 FINAL 05/26/2024 1130   REPTSTATUS 05/31/2024 FINAL 05/26/2024 1130    Cardiac Enzymes: No results for input(s): CKTOTAL, CKMB, CKMBINDEX, TROPONINI in the last 168 hours. CBG: Recent Labs  Lab 06/25/24 1702  GLUCAP 73   Iron Studies:  No results for input(s): IRON, TIBC, TRANSFERRIN, FERRITIN in the last 72 hours.  @lablastinr3 @ Studies/Results: No results found.  Medications:    amLODipine   10  mg Oral Daily   Chlorhexidine  Gluconate Cloth  6 each Topical Q0600   Chlorhexidine  Gluconate Cloth  6 each Topical Daily   clopidogrel   75 mg Oral Daily   docusate sodium   100 mg Oral BID   doxercalciferol   1 mcg Intravenous Q M,W,F-HD   feeding supplement (NEPRO CARB STEADY)  237 mL Oral BID BM   hydrALAZINE   100 mg Oral Q8H   irbesartan   300 mg Oral Daily   multivitamin  1 tablet Oral QHS   mupirocin  ointment  1 Application Nasal BID   sevelamer  carbonate  1,600 mg Oral TID WC   And   sevelamer  carbonate  800 mg Oral With snacks    Dialysis Orders: [facility] MWF  2k/2.5 ca BF 450 ml/min DF 800 ml/min EDW 57.6 kg  Profile 2 3 hrs and 45 minutes Access:  AV graft Meds: mircera 50 mcg every 2 weeks (last given 06/12/24); venofer 50 mg weekly Hectorol  1 mcg three times a week  Assessment/Plan: # ESRD Usual MWF  - Had HD Tues, Thur, Fri ths week.  - Back on schedule. Next HD Monday.    # Right femoral neck fracture  -per ortho - Please avoid morphine  for pain due to ESRD (toxic metabolites accumulate) - If an IV agent is needed, would recommend fentanyl  or dilaudid .  - Please avoid any continuous fluids   # HTN  - Hypertensive with pain as well as known HTN -  optimize volume status with HD. Got below EDW on 1/30  - pain control per primary team  - continue home meds as tolerated per primary team    # Anemia of CKD  - Hb acceptable - no ESA indicated    # Metabolic bone disease  - resumed home renvela   - resumed hectorol   - phos at goal  Maisie Ronnald Acosta PA-C [facility] 06/29/2024,10:43 AM   "

## 2024-06-30 DIAGNOSIS — E43 Unspecified severe protein-calorie malnutrition: Secondary | ICD-10-CM

## 2024-06-30 DIAGNOSIS — S72001A Fracture of unspecified part of neck of right femur, initial encounter for closed fracture: Secondary | ICD-10-CM | POA: Diagnosis not present

## 2024-06-30 DIAGNOSIS — I071 Rheumatic tricuspid insufficiency: Secondary | ICD-10-CM | POA: Insufficient documentation

## 2024-06-30 DIAGNOSIS — I340 Nonrheumatic mitral (valve) insufficiency: Secondary | ICD-10-CM | POA: Insufficient documentation

## 2024-06-30 DIAGNOSIS — I272 Pulmonary hypertension, unspecified: Secondary | ICD-10-CM | POA: Diagnosis not present

## 2024-06-30 LAB — CBC WITH DIFFERENTIAL/PLATELET
Abs Immature Granulocytes: 0.04 10*3/uL (ref 0.00–0.07)
Basophils Absolute: 0.1 10*3/uL (ref 0.0–0.1)
Basophils Relative: 1 %
Eosinophils Absolute: 0.4 10*3/uL (ref 0.0–0.5)
Eosinophils Relative: 7 %
HCT: 30.7 % — ABNORMAL LOW (ref 39.0–52.0)
Hemoglobin: 10 g/dL — ABNORMAL LOW (ref 13.0–17.0)
Immature Granulocytes: 1 %
Lymphocytes Relative: 8 %
Lymphs Abs: 0.5 10*3/uL — ABNORMAL LOW (ref 0.7–4.0)
MCH: 31.2 pg (ref 26.0–34.0)
MCHC: 32.6 g/dL (ref 30.0–36.0)
MCV: 95.6 fL (ref 80.0–100.0)
Monocytes Absolute: 0.7 10*3/uL (ref 0.1–1.0)
Monocytes Relative: 11 %
Neutro Abs: 4.5 10*3/uL (ref 1.7–7.7)
Neutrophils Relative %: 72 %
Platelets: 277 10*3/uL (ref 150–400)
RBC: 3.21 MIL/uL — ABNORMAL LOW (ref 4.22–5.81)
RDW: 15.2 % (ref 11.5–15.5)
WBC: 6.1 10*3/uL (ref 4.0–10.5)
nRBC: 0 % (ref 0.0–0.2)

## 2024-06-30 LAB — RENAL FUNCTION PANEL
Albumin: 3.7 g/dL (ref 3.5–5.0)
Anion gap: 12 (ref 5–15)
BUN: 47 mg/dL — ABNORMAL HIGH (ref 8–23)
CO2: 26 mmol/L (ref 22–32)
Calcium: 9.2 mg/dL (ref 8.9–10.3)
Chloride: 95 mmol/L — ABNORMAL LOW (ref 98–111)
Creatinine, Ser: 6.82 mg/dL — ABNORMAL HIGH (ref 0.61–1.24)
GFR, Estimated: 8 mL/min — ABNORMAL LOW
Glucose, Bld: 150 mg/dL — ABNORMAL HIGH (ref 70–99)
Phosphorus: 3.2 mg/dL (ref 2.5–4.6)
Potassium: 4.7 mmol/L (ref 3.5–5.1)
Sodium: 133 mmol/L — ABNORMAL LOW (ref 135–145)

## 2024-06-30 MED ORDER — LABETALOL HCL 5 MG/ML IV SOLN
10.0000 mg | INTRAVENOUS | Status: DC | PRN
  Filled 2024-06-30 (×2): qty 4

## 2024-06-30 MED ORDER — ISOSORBIDE MONONITRATE ER 30 MG PO TB24
30.0000 mg | ORAL_TABLET | Freq: Every day | ORAL | Status: DC
  Administered 2024-06-30 – 2024-07-01 (×2): 30 mg via ORAL
  Filled 2024-06-30 (×2): qty 1

## 2024-06-30 MED ORDER — CARVEDILOL 6.25 MG PO TABS
6.2500 mg | ORAL_TABLET | Freq: Two times a day (BID) | ORAL | Status: DC
  Administered 2024-06-30 – 2024-07-02 (×4): 6.25 mg via ORAL
  Filled 2024-06-30 (×4): qty 1

## 2024-06-30 MED ORDER — HYDRALAZINE HCL 25 MG PO TABS: 25.0000 mg | ORAL_TABLET | Freq: Four times a day (QID) | ORAL | Status: DC | PRN

## 2024-06-30 NOTE — Plan of Care (Signed)
 Patient is calm.   Problem: Clinical Measurements: Goal: Will remain free from infection Outcome: Progressing   Problem: Activity: Goal: Risk for activity intolerance will decrease Outcome: Progressing   Problem: Nutrition: Goal: Adequate nutrition will be maintained Outcome: Progressing   Problem: Coping: Goal: Level of anxiety will decrease Outcome: Progressing   Problem: Pain Managment: Goal: General experience of comfort will improve and/or be controlled Outcome: Progressing   Problem: Safety: Goal: Ability to remain free from injury will improve Outcome: Progressing   Problem: Skin Integrity: Goal: Risk for impaired skin integrity will decrease Outcome: Progressing   Problem: Activity: Goal: Ability to ambulate and perform ADLs will improve Outcome: Progressing

## 2024-06-30 NOTE — Progress Notes (Signed)
 " PROGRESS NOTE  Shawn Collins FMW:[id number] DOB: [birth year]   PCP: Shelda Atlas, MD  Patient is from: SNF ([facility])  DOA: 06/24/2024 LOS: 6  Chief complaints Chief Complaint  Patient presents with   fall/sepsis?   Fall     Brief Narrative / Interim history: 74 year old M with PMH of ESRD on HD MWF, CAD s/p CABG, cognitive impairment, COPD, CVA, HTN and anemia of renal disease brought to ED from SNF [facility]) due to fall out of bed, and admitted with right femoral fracture and possible syncope.  In ED, BP elevated with BP in 170s.  K5.2.  BUN 58.  Creatinine 8.97.  proBNP 17,500.  High-sensitivity troponin 158.  CBC without acute finding.  CT head and MRI brain without acute finding.  Hip x-ray raise concern for right femoral neck fracture.  Orthopedic surgery consulted.  CT scan and echocardiogram ordered.  Nephrology and orthopedic surgery consulted.  TTE on 1/26 with LVEF of 60 to 65%, G2 DD, moderate MR, moderate to severe TR and RVSP of 84 mmHg.  Patient underwent ORIF of right femoral fracture by Dr. Jerri on 1/27.  Therapy recommended return to SNF.  Patient was discharged to [facility] on 1/30 but discharge canceled due to uncontrolled hypertension.   Subjective: Seen and examined earlier this morning.  No major events overnight or this morning.  Reports not feeling well but not able to be specific.  He denies pain, shortness of breath, GI or UTI symptoms.   Assessment and plan: Fall out of the bed at SNF-CT head and MRI brain without acute finding. Right femoral neck fracture due to fall at SNF. -S/p ORIF by Dr. Jerri on 1/26. -Pain control and VTE prophylaxis per orthopedic surgery -Therapy recommended return to SNF.   Uncontrolled hypertension: BP improved this afternoon after addition of Imdur  and Coreg . -Continue amlodipine , hydralazine  and Avapro . -Added Imdur  30 mg daily -Added Coreg  6.25 mg twice daily -P.o. hydralazine  and/or IV labetalol  as needed. -  Ultrafiltration per nephrology  ESRD on HD on MWF: Appears euvolemic on exam. Bone mineral disorder -Management per nephrology.  Elevated troponin/demand ischemia: Likely due to uncontrolled hypertension and ESRD. CAD/CABG-no cardiopulmonary symptoms. - Manage hypertension as above - Continue Plavix  and Lipitor   Chronic HFpEF/severe PAH: TTE on 1/26 with LVEF of 60 to 65%, G2 DD, moderate MR, moderate to severe TR and RVSP of 84 mmHg.  Appears euvolemic on exam. - Fluid management by dialysis - Manage hypertension as above  Moderate mitral valve regurgitation Moderate to severe tricuspid valve regurgitation - Outpatient follow-up with cardiology. - Doubt candidacy for surgical intervention  Chronic COPD: Stable - Nebulizers as needed  Cognitive impairment: Fairly oriented but not a great historian. - Reorientation and delirium precaution  Anemia of renal disease: H&H stable. -Continue monitoring  Possible syncope: Patient was brought to ED after he fell out of the bed.  VQ scan low probability for PE.  TTE as above.  BNP and troponin elevated likely in the setting of uncontrolled hypertension.  CTA head and MRI brain without acute finding.  Thyroid  panel normal. - Manage hypertension as above  Severe malnutrition Body mass index is 17.9 kg/m. Nutrition Problem: Severe Malnutrition Etiology: chronic illness Signs/Symptoms: severe fat depletion, severe muscle depletion Interventions: Nepro shake, Refer to RD note for recommendations, MVI   DVT prophylaxis:  SCDs Start: 06/25/24 1757 Place TED hose Start: 06/25/24 1757 Place and maintain sequential compression device Start: 06/24/24 1843 SCDs Start: 06/24/24 1832  Code Status: DNR Family  Communication: None at bedside Level of care: Telemetry Status is: Inpatient Remains inpatient appropriate because: Uncontrolled hypertension   Final disposition: SNF   55 minutes with more than 50% spent in reviewing records,  counseling patient/family and coordinating care.  Consultants:  Orthopedic surgery  Procedures: 1/26-ORIF for right femoral fracture  Microbiology summarized: MRSA PCR screen positive.  Objective: Vitals:   06/30/24 0800 06/30/24 0957 06/30/24 1300 06/30/24 1425  BP: (!) 189/72 (!) 188/64 (!) 207/79 125/71  Pulse:  66 72 72  Resp:   16   Temp:  97.8 F (36.6 C) 98.5 F (36.9 C) 98 F (36.7 C)  TempSrc:   Oral Oral  SpO2:  100%  100%  Weight:      Height:        Examination:  GENERAL: Appears frail.  No apparent distress. HEENT: MMM.  Vision and hearing grossly intact.  NECK: Supple.  No apparent JVD.  RESP:  No IWOB.  Fair aeration bilaterally. CVS:  RRR. Heart sounds normal.  ABD/GI/GU: BS+. Abd soft, NTND.  MSK/EXT:  Moves extremities.  Significant muscle mass and subcu fat loss. SKIN: no apparent skin lesion or wound NEURO: AA.  Oriented appropriately.  No apparent focal neuro deficit. PSYCH: Calm. Normal affect.   Sch Meds:  Scheduled Meds:  amLODipine   10 mg Oral Daily   carvedilol   6.25 mg Oral BID WC   Chlorhexidine  Gluconate Cloth  6 each Topical Q0600   Chlorhexidine  Gluconate Cloth  6 each Topical Daily   clopidogrel   75 mg Oral Daily   docusate sodium   100 mg Oral BID   doxercalciferol   1 mcg Intravenous Q M,W,F-HD   feeding supplement (NEPRO CARB STEADY)  237 mL Oral BID BM   hydrALAZINE   100 mg Oral Q8H   irbesartan   300 mg Oral Daily   isosorbide  mononitrate  30 mg Oral Daily   multivitamin  1 tablet Oral QHS   mupirocin  ointment  1 Application Nasal BID   sevelamer  carbonate  1,600 mg Oral TID WC   And   sevelamer  carbonate  800 mg Oral With snacks   Continuous Infusions: PRN Meds:.acetaminophen , alum & mag hydroxide-simeth, hydrALAZINE , HYDROmorphone , labetalol , magnesium  citrate, menthol  **OR** phenol, methocarbamol  **OR** methocarbamol  (ROBAXIN ) injection, morphine  injection, ondansetron  **OR** ondansetron  (ZOFRAN ) IV, polyethylene glycol,  sorbitol   Antimicrobials: Anti-infectives (From admission, onward)    Start     Dose/Rate Route Frequency Ordered Stop   06/26/24 0600  ceFAZolin  (ANCEF ) IVPB 2g/100 mL premix        2 g 200 mL/hr over 30 Minutes Intravenous On call to O.R. 06/25/24 1454 06/25/24 1618   06/25/24 2200  ceFAZolin  (ANCEF ) IVPB 2g/100 mL premix        2 g 200 mL/hr over 30 Minutes Intravenous Every 6 hours 06/25/24 1756 06/26/24 0922   06/25/24 1442  ceFAZolin  (ANCEF ) 2-4 GM/100ML-% IVPB       Note to Pharmacy: Jonda Bottoms M: cabinet override      06/25/24 1442 06/25/24 1621        I have personally reviewed the following labs and images: CBC: Recent Labs  Lab 06/24/24 1447 06/24/24 1453 06/24/24 1454 06/25/24 1505 06/26/24 0614  WBC 7.2  --   --   --  5.7  NEUTROABS 6.1  --   --   --   --   HGB 10.6* 11.6* 11.9* 12.6* 10.7*  HCT 33.2* 34.0* 35.0* 37.0* 32.3*  MCV 98.5  --   --   --  96.1  PLT 242  --   --   --  221   BMP &GFR Recent Labs  Lab 06/24/24 1447 06/24/24 1453 06/24/24 1454 06/25/24 0522 06/25/24 1505  NA 139 140 140 139 136  K 4.9 4.3 4.3 5.2* 4.0  CL 98 101  --  99 95*  CO2 25  --   --  26  --   GLUCOSE 139* 140*  --  143* 76  BUN 51* 49*  --  58* 25*  CREATININE 7.83* 8.90*  --  8.97* 4.70*  CALCIUM  9.7  --   --  9.7  --   PHOS  --   --   --  4.5  --    Estimated Creatinine Clearance: 10.9 mL/min (A) (by C-G formula based on SCr of 4.7 mg/dL (H)). Liver & Pancreas: Recent Labs  Lab 06/24/24 1447 06/25/24 0522  AST 37  --   ALT 15  --   ALKPHOS 88  --   BILITOT 0.6  --   PROT 7.5  --   ALBUMIN  4.3 4.3   Recent Labs  Lab 06/24/24 1447  LIPASE 27   No results for input(s): AMMONIA in the last 168 hours. Diabetic: No results for input(s): HGBA1C in the last 72 hours. Recent Labs  Lab 06/25/24 1702  GLUCAP 73   Cardiac Enzymes: No results for input(s): CKTOTAL, CKMB, CKMBINDEX, TROPONINI in the last 168 hours. Recent Labs     06/24/24 2303  PROBNP 17,514.0*   Coagulation Profile: Recent Labs  Lab 06/24/24 1503  INR 1.3*   Thyroid  Function Tests: No results for input(s): TSH, T4TOTAL, FREET4, T3FREE, THYROIDAB in the last 72 hours. Lipid Profile: No results for input(s): CHOL, HDL, LDLCALC, TRIG, CHOLHDL, LDLDIRECT in the last 72 hours. Anemia Panel: No results for input(s): VITAMINB12, FOLATE, FERRITIN, TIBC, IRON, RETICCTPCT in the last 72 hours. Urine analysis:    Component Value Date/Time   COLORURINE YELLOW 05/24/2024 2109   APPEARANCEUR CLEAR 05/24/2024 2109   LABSPEC 1.011 05/24/2024 2109   PHURINE 7.0 05/24/2024 2109   GLUCOSEU 50 (A) 05/24/2024 2109   HGBUR NEGATIVE 05/24/2024 2109   BILIRUBINUR NEGATIVE 05/24/2024 2109   KETONESUR NEGATIVE 05/24/2024 2109   PROTEINUR 100 (A) 05/24/2024 2109   UROBILINOGEN 1.0 06/27/2010 0631   NITRITE NEGATIVE 05/24/2024 2109   LEUKOCYTESUR NEGATIVE 05/24/2024 2109   Sepsis Labs: Invalid input(s): PROCALCITONIN, LACTICIDVEN  Microbiology: Recent Results (from the past 240 hours)  Surgical pcr screen     Status: Abnormal   Collection Time: 06/25/24  3:10 PM   Specimen: Nasal Mucosa; Nasal Swab  Result Value Ref Range Status   MRSA, PCR POSITIVE (A) NEGATIVE Final    Comment: RESULT CALLED TO, READ BACK BY AND VERIFIED WITH: RN Bernarda DASEN on [id number] @1825  by SM    Staphylococcus aureus POSITIVE (A) NEGATIVE Final    Comment: (NOTE) The Xpert SA Assay (FDA approved for NASAL specimens in patients 81 years of age and older), is one component of a comprehensive surveillance program. It is not intended to diagnose infection nor to guide or monitor treatment. Performed at [facility], [address], [place], [place] [address]     Radiology Studies: No results found.    Trentan Trippe T. Keslyn Teater [facility]  If 7PM-7AM, please contact night-coverage [email or web address] 06/30/2024, 3:09 PM   "

## 2024-06-30 NOTE — TOC Progression Note (Signed)
 Transition of Care [facility]) - Progression Note    Patient Details  Name: Shawn Collins MRN: [id number] Date of Birth: May 17, 1951  [facility]) CM/SW Contact  Cena Ligas, [place] Phone Number: 06/30/2024, 1:32 PM  Clinical Narrative:     [name] is not able to accept patient today for SNF due to their pharmacy being closed. [name] will be able to accept him tomorrow if medically ready.   Expected Discharge Plan: Skilled Nursing Facility Barriers to Discharge: Continued Medical Work up               Expected Discharge Plan and Services In-house Referral: Clinical Social Work   Post Acute Care Choice: Skilled Nursing Facility Living arrangements for the past 2 months: Single Family Home Expected Discharge Date: 06/28/24                                     Social Drivers of Health (SDOH) Interventions SDOH Screenings   Food Insecurity: No Food Insecurity (06/25/2024)  Housing: Low Risk (06/25/2024)  Transportation Needs: No Transportation Needs (06/25/2024)  Utilities: Not At Risk (06/25/2024)  Social Connections: Moderately Isolated (06/25/2024)  Tobacco Use: Medium Risk (06/25/2024)    Readmission Risk Interventions    07/07/2022    9:49 AM  Readmission Risk Prevention Plan  Transportation Screening Complete  PCP or Specialist Appt within 3-5 Days Complete  HRI or Home Care Consult Complete  Social Work Consult for Recovery Care Planning/Counseling --  Palliative Care Screening Not Applicable  Medication Review Oceanographer) Complete

## 2024-06-30 NOTE — Progress Notes (Signed)
 " [facility] Progress Note   Subjective:   Seen in room. Completed dialysis yesterday with 2L UF. Discharge held due to HTN.   Objective Vitals:   06/30/24 0341 06/30/24 0500 06/30/24 0800 06/30/24 0957  BP: (!) 181/67 (!) 197/68 (!) 189/72 (!) 188/64  Pulse: 67   66  Resp: 18     Temp: 97.9 F (36.6 C)   97.8 F (36.6 C)  TempSrc:      SpO2: 100%   100%  Weight:      Height:       Physical Exam General: Sleeping, nad Heart: RRR  Lungs: Clear, normal wob Abdomen: non -tender  Extremities: no LE edema  Dialysis Access: AVG   Additional Objective Labs: Basic Metabolic Panel: Recent Labs  Lab 06/24/24 1447 06/24/24 1453 06/24/24 1454 06/25/24 0522 06/25/24 1505  NA 139 140 140 139 136  K 4.9 4.3 4.3 5.2* 4.0  CL 98 101  --  99 95*  CO2 25  --   --  26  --   GLUCOSE 139* 140*  --  143* 76  BUN 51* 49*  --  58* 25*  CREATININE 7.83* 8.90*  --  8.97* 4.70*  CALCIUM  9.7  --   --  9.7  --   PHOS  --   --   --  4.5  --    Liver Function Tests: Recent Labs  Lab 06/24/24 1447 06/25/24 0522  AST 37  --   ALT 15  --   ALKPHOS 88  --   BILITOT 0.6  --   PROT 7.5  --   ALBUMIN  4.3 4.3   Recent Labs  Lab 06/24/24 1447  LIPASE 27   CBC: Recent Labs  Lab 06/24/24 1447 06/24/24 1453 06/24/24 1454 06/25/24 1505 06/26/24 0614  WBC 7.2  --   --   --  5.7  NEUTROABS 6.1  --   --   --   --   HGB 10.6*   < > 11.9* 12.6* 10.7*  HCT 33.2*   < > 35.0* 37.0* 32.3*  MCV 98.5  --   --   --  96.1  PLT 242  --   --   --  221   < > = values in this interval not displayed.   Blood Culture    Component Value Date/Time   SDES BLOOD RIGHT ARM 05/26/2024 1130   SDES BLOOD RIGHT HAND 05/26/2024 1130   SPECREQUEST  05/26/2024 1130    BOTTLES DRAWN AEROBIC AND ANAEROBIC Blood Culture adequate volume   SPECREQUEST  05/26/2024 1130    BOTTLES DRAWN AEROBIC ONLY Blood Culture results may not be optimal due to an inadequate volume of blood received in culture  bottles   CULT  05/26/2024 1130    NO GROWTH 5 DAYS Performed at [facility], [address], [place], [place] [address]    CULT  05/26/2024 1130    NO GROWTH 5 DAYS Performed at [facility], [address] [place], [place] [address]    REPTSTATUS 05/31/2024 FINAL 05/26/2024 1130   REPTSTATUS 05/31/2024 FINAL 05/26/2024 1130    Cardiac Enzymes: No results for input(s): CKTOTAL, CKMB, CKMBINDEX, TROPONINI in the last 168 hours. CBG: Recent Labs  Lab 06/25/24 1702  GLUCAP 73   Iron Studies:  No results for input(s): IRON, TIBC, TRANSFERRIN, FERRITIN in the last 72 hours.  @lablastinr3 @ Studies/Results: No results found.  Medications:    amLODipine   10  mg Oral Daily   Chlorhexidine  Gluconate Cloth  6 each Topical Q0600   Chlorhexidine  Gluconate Cloth  6 each Topical Daily   clopidogrel   75 mg Oral Daily   docusate sodium   100 mg Oral BID   doxercalciferol   1 mcg Intravenous Q M,W,F-HD   feeding supplement (NEPRO CARB STEADY)  237 mL Oral BID BM   hydrALAZINE   100 mg Oral Q8H   irbesartan   300 mg Oral Daily   isosorbide  mononitrate  30 mg Oral Daily   multivitamin  1 tablet Oral QHS   mupirocin  ointment  1 Application Nasal BID   sevelamer  carbonate  1,600 mg Oral TID WC   And   sevelamer  carbonate  800 mg Oral With snacks    Dialysis Orders: [facility] MWF  2k/2.5 ca BF 450 ml/min DF 800 ml/min EDW 57.6 kg  Profile 2 3 hrs and 45 minutes Access:  AV graft Meds: mircera 50 mcg every 2 weeks (last given 06/12/24); venofer 50 mg weekly Hectorol  1 mcg three times a week  Assessment/Plan: # ESRD Usual MWF  - Had HD Tues, Thur, Fri ths week.  - Back on schedule. Next HD Monday; needs labs and will request.   # Right femoral neck fracture  -per ortho - Please avoid morphine  for pain due to ESRD (toxic metabolites accumulate) - If an IV agent is needed, would recommend fentanyl  or dilaudid .  - Please avoid any continuous  fluids   # HTN  - Hypertensive with pain as well as known HTN - optimize volume status with HD. Got below EDW on 1/30  - pain control per primary team  - continue home meds as tolerated per primary team    # Anemia of CKD  - Hb acceptable - no ESA indicated    # Metabolic bone disease  - resumed home renvela   - resumed hectorol   - phos at goal    "

## 2024-07-01 DIAGNOSIS — Z66 Do not resuscitate: Secondary | ICD-10-CM

## 2024-07-01 DIAGNOSIS — Z8673 Personal history of transient ischemic attack (TIA), and cerebral infarction without residual deficits: Secondary | ICD-10-CM | POA: Diagnosis not present

## 2024-07-01 DIAGNOSIS — S72001A Fracture of unspecified part of neck of right femur, initial encounter for closed fracture: Secondary | ICD-10-CM | POA: Diagnosis not present

## 2024-07-01 DIAGNOSIS — I2510 Atherosclerotic heart disease of native coronary artery without angina pectoris: Secondary | ICD-10-CM

## 2024-07-01 DIAGNOSIS — I071 Rheumatic tricuspid insufficiency: Secondary | ICD-10-CM

## 2024-07-01 DIAGNOSIS — E43 Unspecified severe protein-calorie malnutrition: Secondary | ICD-10-CM | POA: Diagnosis not present

## 2024-07-01 DIAGNOSIS — I340 Nonrheumatic mitral (valve) insufficiency: Secondary | ICD-10-CM

## 2024-07-01 DIAGNOSIS — Z951 Presence of aortocoronary bypass graft: Secondary | ICD-10-CM | POA: Diagnosis not present

## 2024-07-01 DIAGNOSIS — I272 Pulmonary hypertension, unspecified: Secondary | ICD-10-CM | POA: Diagnosis not present

## 2024-07-01 MED ORDER — ISOSORBIDE MONONITRATE ER 30 MG PO TB24: 30.0000 mg | ORAL_TABLET | Freq: Every day | ORAL | Status: DC

## 2024-07-01 MED ORDER — CARVEDILOL 6.25 MG PO TABS: 6.2500 mg | ORAL_TABLET | Freq: Two times a day (BID) | ORAL | Status: AC

## 2024-07-01 NOTE — Progress Notes (Signed)
 " [facility] Progress Note   Subjective:    Seen in room No /co's today   Objective Vitals:   07/01/24 0728 07/01/24 1134 07/01/24 1136 07/01/24 1357  BP: (!) 192/73 (!) 166/63  (!) 165/69  Pulse: 74  (!) 55 60  Resp:      Temp: 98 F (36.7 C)   (!) 97.4 F (36.3 C)  TempSrc:    Oral  SpO2: 98%  100% 100%  Weight:      Height:       Physical Exam General: Sleeping, nad Heart: RRR  Lungs: Clear, normal wob Abdomen: non -tender  Extremities: no LE edema  Dialysis Access: AVG   Additional Objective Labs: Basic Metabolic Panel: Recent Labs  Lab 06/25/24 0522 06/25/24 1505 06/30/24 1840  NA 139 136 133*  K 5.2* 4.0 4.7  CL 99 95* 95*  CO2 26  --  26  GLUCOSE 143* 76 150*  BUN 58* 25* 47*  CREATININE 8.97* 4.70* 6.82*  CALCIUM  9.7  --  9.2  PHOS 4.5  --  3.2   Liver Function Tests: Recent Labs  Lab 06/25/24 0522 06/30/24 1840  ALBUMIN  4.3 3.7   No results for input(s): LIPASE, AMYLASE in the last 168 hours.  CBC: Recent Labs  Lab 06/25/24 1505 06/26/24 0614 06/30/24 1840  WBC  --  5.7 6.1  NEUTROABS  --   --  4.5  HGB 12.6* 10.7* 10.0*  HCT 37.0* 32.3* 30.7*  MCV  --  96.1 95.6  PLT  --  221 277   Blood Culture    Component Value Date/Time   SDES BLOOD RIGHT ARM 05/26/2024 1130   SDES BLOOD RIGHT HAND 05/26/2024 1130   SPECREQUEST  05/26/2024 1130    BOTTLES DRAWN AEROBIC AND ANAEROBIC Blood Culture adequate volume   SPECREQUEST  05/26/2024 1130    BOTTLES DRAWN AEROBIC ONLY Blood Culture results may not be optimal due to an inadequate volume of blood received in culture bottles   CULT  05/26/2024 1130    NO GROWTH 5 DAYS Performed at [facility], [address], [place], [place] [address]    CULT  05/26/2024 1130    NO GROWTH 5 DAYS Performed at [facility], [address] [place], [place] [address]    REPTSTATUS 05/31/2024 FINAL 05/26/2024 1130   REPTSTATUS 05/31/2024 FINAL 05/26/2024 1130     Cardiac Enzymes: No results for input(s): CKTOTAL, CKMB, CKMBINDEX, TROPONINI in the last 168 hours. CBG: Recent Labs  Lab 06/25/24 1702  GLUCAP 73   Iron Studies:  No results for input(s): IRON, TIBC, TRANSFERRIN, FERRITIN in the last 72 hours.  @lablastinr3 @ Studies/Results: No results found.  Medications:    amLODipine   10 mg Oral Daily   carvedilol   6.25 mg Oral BID WC   Chlorhexidine  Gluconate Cloth  6 each Topical Q0600   clopidogrel   75 mg Oral Daily   docusate sodium   100 mg Oral BID   doxercalciferol   1 mcg Intravenous Q M,W,F-HD   feeding supplement (NEPRO CARB STEADY)  237 mL Oral BID BM   hydrALAZINE   100 mg Oral Q8H   irbesartan   300 mg Oral Daily   isosorbide  mononitrate  30 mg Oral Daily   multivitamin  1 tablet Oral QHS   sevelamer  carbonate  1,600 mg Oral TID WC   And   sevelamer  carbonate  800 mg Oral With snacks    Dialysis Orders: [facility] MWF  3h  2K bath  57.6kg  B450  AVG   Heparin  ?  mircera 50 mcg every 2 weeks (last given 06/12/24) venofer 50 mg weekly Hectorol  1 mcg three times a week  Assessment/Plan: # ESRD Usual MWF  - Had HD Tues, Thur, Fri ths week.  - HD Monday    # Right femoral neck fracture  -per ortho - Please avoid morphine  for pain due to ESRD (toxic metabolites accumulate)   # HTN  - Hypertensive with pain as well as known HTN - optimize volume status with HD. Got below EDW on 1/30  - continue home meds as tolerated per primary team  - no vol overload on exam   # Anemia of CKD  - Hb acceptable - no ESA indicated    # Metabolic bone disease  - resumed home renvela   - resumed hectorol   - phos at goal  Myer Fret  MD  [facility] 07/01/2024, 3:26 PM  Recent Labs  Lab 06/25/24 0522 06/25/24 0522 06/25/24 1505 06/26/24 0614 06/30/24 1840  HGB  --    < > 12.6* 10.7* 10.0*  ALBUMIN  4.3  --   --   --  3.7  CALCIUM  9.7  --   --   --  9.2  PHOS 4.5  --   --   --  3.2  CREATININE 8.97*  --  4.70*   --  6.82*  K 5.2*  --  4.0  --  4.7   < > = values in this interval not displayed.    Inpatient medications:  amLODipine   10 mg Oral Daily   carvedilol   6.25 mg Oral BID WC   Chlorhexidine  Gluconate Cloth  6 each Topical Q0600   clopidogrel   75 mg Oral Daily   docusate sodium   100 mg Oral BID   doxercalciferol   1 mcg Intravenous Q M,W,F-HD   feeding supplement (NEPRO CARB STEADY)  237 mL Oral BID BM   hydrALAZINE   100 mg Oral Q8H   irbesartan   300 mg Oral Daily   isosorbide  mononitrate  30 mg Oral Daily   multivitamin  1 tablet Oral QHS   sevelamer  carbonate  1,600 mg Oral TID WC   And   sevelamer  carbonate  800 mg Oral With snacks    acetaminophen , alum & mag hydroxide-simeth, hydrALAZINE , HYDROmorphone , labetalol , magnesium  citrate, menthol  **OR** phenol, methocarbamol  **OR** methocarbamol  (ROBAXIN ) injection, morphine  injection, ondansetron  **OR** ondansetron  (ZOFRAN ) IV, polyethylene glycol, sorbitol          "

## 2024-07-01 NOTE — Plan of Care (Signed)
   Problem: Education: Goal: Knowledge of General Education information will improve Description: Including pain rating scale, medication(s)/side effects and non-pharmacologic comfort measures Outcome: Progressing   Problem: Clinical Measurements: Goal: Will remain free from infection Outcome: Progressing

## 2024-07-01 NOTE — Progress Notes (Signed)
 [facility] contacted this morning to be advised that pt remains in the hospital and will not be at treatment today. Will assist as needed.   Randine Mungo Dialysis Navigator [phone]

## 2024-07-01 NOTE — Plan of Care (Signed)

## 2024-07-01 NOTE — Progress Notes (Signed)
 Physical Therapy Treatment Patient Details Name: Shawn Collins MRN: [id number] DOB: 11-Jan-1951 Today's Date: 07/01/2024   History of Present Illness Pt is a 74 year old man admitted on 06/25/24 after a fall at [facility] resulting in R femoral neck fx. Underwent ORIF 06/25/24. PMH: recent admisstion with weakness and hypoglycemia, discharge to SNF, ESRD, CAD, COPD, CVA, DM2, HLD, HTN, low vision.    PT Comments  Continuing work on functional mobility and activity tolerance;  Pt's mentation and ability to particpate improved from last session, and he verbalized the need to keep weight off of his RLE while moving -- he also verbalized how difficult it is for him to keep weight off his RLE when standing; Still, with intentional practice doing small hops on LLE in place first, he was able to take a few hop-steps; Patient will benefit from continued inpatient follow up therapy, <3 hours/day    If plan is discharge home, recommend the following: A little help with walking and/or transfers;A little help with bathing/dressing/bathroom;Supervision due to cognitive status;Assist for transportation;Assistance with cooking/housework   Can travel by private vehicle     No  Equipment Recommendations  Rolling walker (2 wheels);BSC/3in1    Recommendations for Other Services       Precautions / Restrictions Precautions Precautions: Fall Recall of Precautions/Restrictions: Impaired Restrictions RLE Weight Bearing Per Provider Order: Touchdown weight bearing RLE Partial Weight Bearing Percentage or Pounds:  (25%) Other Position/Activity Restrictions: REcall of WB status improved     Mobility  Bed Mobility Overal bed mobility: Needs Assistance Bed Mobility: Supine to Sit     Supine to sit: Min assist     General bed mobility comments: Min handheld assist to pull to upright sitting    Transfers Overall transfer level: Needs assistance Equipment used: Rolling walker (2 wheels) Transfers: Sit  to/from Stand Sit to Stand: Min assist           General transfer comment: cues for hand placement and WB precautions; noted incr weight on R foot during transition    Ambulation/Gait Ambulation/Gait assistance: Min assist, Mod assist Gait Distance (Feet): 4 Feet Assistive device: Rolling walker (2 wheels) Gait Pattern/deviations:  (Hop-to)       General Gait Details: performed a few small Hop-to steps on L foot only; also worked on pivoting on L foot/heel; very close guard, and at times light mod assist for balance   Stairs             Wheelchair Mobility     Tilt Bed    Modified Rankin (Stroke Patients Only)       Balance Overall balance assessment: Needs assistance   Sitting balance-Leahy Scale: Fair       Standing balance-Leahy Scale: Poor                              Communication Communication Communication: No apparent difficulties  Cognition Arousal: Alert Behavior During Therapy: WFL for tasks assessed/performed                             Following commands: Intact      Cueing Cueing Techniques: Verbal cues, Visual cues, Gestural cues, Tactile cues  Exercises      General Comments General comments (skin integrity, edema, etc.): O2 sats 99% on room air      Pertinent Vitals/Pain Pain Assessment Pain Assessment: Faces Faces Pain Scale: Hurts  little more Pain Location: RLE Pain Descriptors / Indicators: Discomfort, Operative site guarding Pain Intervention(s): Monitored during session    Home Living                          Prior Function            PT Goals (current goals can now be found in the care plan section) Acute Rehab PT Goals Patient Stated Goal: To get better PT Goal Formulation: With patient Time For Goal Achievement: 07/10/24 Potential to Achieve Goals: Good Progress towards PT goals: Progressing toward goals    Frequency    Min 3X/week      PT Plan       Co-evaluation              AM-PAC PT 6 Clicks Mobility   Outcome Measure  Help needed turning from your back to your side while in a flat bed without using bedrails?: A Little Help needed moving from lying on your back to sitting on the side of a flat bed without using bedrails?: A Little Help needed moving to and from a bed to a chair (including a wheelchair)?: A Lot Help needed standing up from a chair using your arms (e.g., wheelchair or bedside chair)?: A Lot Help needed to walk in hospital room?: A Lot Help needed climbing 3-5 steps with a railing? : Total 6 Click Score: 13    End of Session Equipment Utilized During Treatment: Gait belt Activity Tolerance: Patient tolerated treatment well Patient left: in bed;with call bell/phone within reach;with bed alarm set   PT Visit Diagnosis: Unsteadiness on feet (R26.81);Other abnormalities of gait and mobility (R26.89);Difficulty in walking, not elsewhere classified (R26.2)     Time: 8463-8444 PT Time Calculation (min) (ACUTE ONLY): 19 min  Charges:    $Gait Training: 8-22 mins PT General Charges $$ ACUTE PT VISIT: 1 Visit                     Silvano Currier, PT  Acute Rehabilitation Services Office [phone] Secure Chat welcomed    Silvano VEAR Currier 07/01/2024, 5:25 PM

## 2024-07-02 ENCOUNTER — Other Ambulatory Visit (HOSPITAL_COMMUNITY): Payer: Self-pay

## 2024-07-02 MED ORDER — DOXERCALCIFEROL 4 MCG/2ML IV SOLN
INTRAVENOUS | Status: AC
  Filled 2024-07-02: qty 2

## 2024-07-02 MED ORDER — ASPIRIN 81 MG PO TBEC: 81.0000 mg | DELAYED_RELEASE_TABLET | Freq: Every day | ORAL | Status: AC

## 2024-07-02 MED ORDER — ISOSORBIDE MONONITRATE ER 60 MG PO TB24
60.0000 mg | ORAL_TABLET | Freq: Every day | ORAL | Status: DC
  Administered 2024-07-02: 60 mg via ORAL
  Filled 2024-07-02: qty 1

## 2024-07-02 MED ORDER — CHLORHEXIDINE GLUCONATE CLOTH 2 % EX PADS: 6.0000 | MEDICATED_PAD | Freq: Every day | CUTANEOUS | Status: DC

## 2024-07-02 MED ORDER — ISOSORBIDE MONONITRATE ER 60 MG PO TB24: 60.0000 mg | ORAL_TABLET | Freq: Every day | ORAL | Status: AC

## 2024-07-02 NOTE — Progress Notes (Signed)
 RN called and gave report to Geni, [facility] at [place]. She stated understanding Pt does not have an IV.

## 2024-07-02 NOTE — Progress Notes (Signed)
" °   07/02/24 1208  Vitals  Temp (!) 97.5 F (36.4 C)  Pulse Rate 68  Resp 12  BP (!) 166/67  SpO2 100 %  Weight 55.7 kg  Type of Weight Post-Dialysis  Oxygen Therapy  Patient Activity (if Appropriate) In bed  Pulse Oximetry Type Continuous  Oximetry Probe Site Changed No  Post Treatment  Dialyzer Clearance Clear  Liters Processed 72  Fluid Removed (mL) 2000 mL  AVG/AVF Arterial Site Held (minutes) 7 minutes  AVG/AVF Venous Site Held (minutes) 7 minutes   Received patient in bed to unit.  Alert and oriented.  Informed consent signed and in chart.   TX duration:3.0  Patient tolerated well.  Transported back to the room  Alert, without acute distress.  Hand-off given to patient's nurse.   Access used: LLAF Access issues: no complications  Total UF removed: 2000 Medication(s) given: hectoral iv x 1   Delon LITTIE [facility] "

## 2024-07-02 NOTE — Plan of Care (Signed)
   Problem: Education: Goal: Knowledge of General Education information will improve Description: Including pain rating scale, medication(s)/side effects and non-pharmacologic comfort measures Outcome: Progressing   Problem: Clinical Measurements: Goal: Ability to maintain clinical measurements within normal limits will improve Outcome: Progressing Goal: Will remain free from infection Outcome: Progressing

## 2024-07-02 NOTE — Progress Notes (Signed)
" °  [facility] Progress Note   Subjective:    Seen in room C/o's pain all over his body   Objective Vitals:   07/02/24 1208 07/02/24 1234 07/02/24 1316 07/02/24 1518  BP: (!) 166/67  (!) 191/65 (!) 146/68  Pulse: 68 66 64 68  Resp: 12  18 18   Temp: (!) 97.5 F (36.4 C)  98 F (36.7 C) 98.2 F (36.8 C)  TempSrc:   Oral Oral  SpO2: 100% 100% 100% 99%  Weight: 55.7 kg     Height:       Physical Exam General: Sleeping, nad Heart: RRR  Lungs: Clear, normal wob Abdomen: non -tender  Extremities: no LE edema  Dialysis Access: AVG   Dialysis Orders: [facility] MWF  3h  2K bath  57.6kg  B450  AVG   Heparin  none  mircera 50 mcg every 2 weeks (last given 06/12/24) venofer 50 mg weekly Hectorol  1 mcg three times a week  Assessment/Plan: # ESRD Usual MWF  - Had HD Tues, Thur, Fri last week  - Had HD Tuesday this week so far - next HD Wed     # Right femoral neck fracture  -per ortho - Please avoid morphine  for pain due to ESRD (toxic metabolites accumulate)   # HTN  - Hypertensive with pain as well as known HTN - optimize volume status with HD. Got below EDW on 1/30  - continue home meds as tolerated per primary team  - no vol overload on exam   # Anemia of CKD  - Hb acceptable - no ESA indicated    # Metabolic bone disease  - resumed home renvela   - resumed hectorol   - phos at goal  Myer Fret  MD  [facility] 07/02/2024, 3:29 PM  Recent Labs  Lab 06/26/24 0614 06/30/24 1840  HGB 10.7* 10.0*  ALBUMIN   --  3.7  CALCIUM   --  9.2  PHOS  --  3.2  CREATININE  --  6.82*  K  --  4.7    Inpatient medications:  amLODipine   10 mg Oral Daily   carvedilol   6.25 mg Oral BID WC   Chlorhexidine  Gluconate Cloth  6 each Topical Q0600   clopidogrel   75 mg Oral Daily   docusate sodium   100 mg Oral BID   doxercalciferol   1 mcg Intravenous Q M,W,F-HD   feeding supplement (NEPRO CARB STEADY)  237 mL Oral BID BM   hydrALAZINE   100 mg Oral Q8H   irbesartan   300  mg Oral Daily   isosorbide  mononitrate  60 mg Oral Daily   multivitamin  1 tablet Oral QHS   sevelamer  carbonate  1,600 mg Oral TID WC   And   sevelamer  carbonate  800 mg Oral With snacks    acetaminophen , alum & mag hydroxide-simeth, hydrALAZINE , HYDROmorphone , labetalol , magnesium  citrate, menthol  **OR** phenol, methocarbamol  **OR** methocarbamol  (ROBAXIN ) injection, morphine  injection, ondansetron  **OR** ondansetron  (ZOFRAN ) IV, polyethylene glycol, sorbitol          "

## 2024-07-02 NOTE — Plan of Care (Signed)
 [facility] Patient Discharge Orders- [facility] CLINIC: [facility]  Patient's name: Shawn Collins Admit/DC Dates: 06/24/2024 - 07/02/2024  Discharge Diagnoses: R hip fx due to fall at SNF s/p ORIF   Outpatient Dialysis Orders:  -Heparin : None  -EDW 56.5  kg   -Bath: No change   Anemia Aranesp : Given: --   Date of last dose/amount: --   PRBC's Given: -- Date/# of units: -- ESA dose for discharge: per protocol   Recent Labs  Lab 06/30/24 1840  HGB 10.0*  K 4.7  CALCIUM  9.2  PHOS 3.2  ALBUMIN  3.7   Access intervention/Change: --  Medications: -IV Antibiotics: -- -Anticoagulation: ---  OTHER/APPTS/LABS  CODE STATUS - DNR. No chest compressions    Completed by: Maisie Ronnald Acosta PA-C   D/C Meds to be reconciled by nurse after every discharge.    Reviewed by: MD:______ RN_______

## 2024-07-02 NOTE — Progress Notes (Signed)
 OT Cancellation Note  Patient Details Name: Shawn Collins MRN: [id number] DOB: 05/27/1951   Cancelled Treatment:    Reason Eval/Treat Not Completed: Patient at procedure or test/ unavailable (HD)  Ely Molt 07/02/2024, 10:53 AM

## 2024-07-02 NOTE — Progress Notes (Signed)
 Patient can be discharged with aspirin  81 mg daily in addition to his home dose of plavix  for DVT prophylaxis.

## 2024-07-05 ENCOUNTER — Encounter (HOSPITAL_COMMUNITY): Payer: Self-pay | Admitting: Surgery

## 2024-07-09 ENCOUNTER — Encounter: Admitting: Physician Assistant
